# Patient Record
Sex: Female | Born: 1955 | Race: Black or African American | Hispanic: No | Smoking: Never smoker
Health system: Southern US, Community
[De-identification: ages and names within clinical notes are randomized; demographics above are authoritative.]

## PROBLEM LIST (undated history)

## (undated) DIAGNOSIS — F32A Depression, unspecified: Secondary | ICD-10-CM

## (undated) DIAGNOSIS — S0990XA Unspecified injury of head, initial encounter: Secondary | ICD-10-CM

## (undated) DIAGNOSIS — M329 Systemic lupus erythematosus, unspecified: Secondary | ICD-10-CM

## (undated) DIAGNOSIS — E669 Obesity, unspecified: Secondary | ICD-10-CM

## (undated) DIAGNOSIS — I5020 Unspecified systolic (congestive) heart failure: Secondary | ICD-10-CM

## (undated) DIAGNOSIS — Z940 Kidney transplant status: Secondary | ICD-10-CM

## (undated) DIAGNOSIS — F419 Anxiety disorder, unspecified: Secondary | ICD-10-CM

## (undated) DIAGNOSIS — I6389 Other cerebral infarction: Secondary | ICD-10-CM

## (undated) DIAGNOSIS — N2581 Secondary hyperparathyroidism of renal origin: Secondary | ICD-10-CM

## (undated) DIAGNOSIS — F329 Major depressive disorder, single episode, unspecified: Secondary | ICD-10-CM

## (undated) DIAGNOSIS — I480 Paroxysmal atrial fibrillation: Secondary | ICD-10-CM

## (undated) DIAGNOSIS — I2510 Atherosclerotic heart disease of native coronary artery without angina pectoris: Secondary | ICD-10-CM

## (undated) DIAGNOSIS — Z87448 Personal history of other diseases of urinary system: Secondary | ICD-10-CM

## (undated) DIAGNOSIS — N186 End stage renal disease: Secondary | ICD-10-CM

## (undated) DIAGNOSIS — R0600 Dyspnea, unspecified: Secondary | ICD-10-CM

## (undated) DIAGNOSIS — Z87442 Personal history of urinary calculi: Secondary | ICD-10-CM

## (undated) DIAGNOSIS — N200 Calculus of kidney: Secondary | ICD-10-CM

## (undated) DIAGNOSIS — G459 Transient cerebral ischemic attack, unspecified: Secondary | ICD-10-CM

## (undated) DIAGNOSIS — N051 Unspecified nephritic syndrome with focal and segmental glomerular lesions: Secondary | ICD-10-CM

## (undated) DIAGNOSIS — I951 Orthostatic hypotension: Secondary | ICD-10-CM

## (undated) DIAGNOSIS — D649 Anemia, unspecified: Secondary | ICD-10-CM

## (undated) DIAGNOSIS — I319 Disease of pericardium, unspecified: Secondary | ICD-10-CM

## (undated) DIAGNOSIS — I10 Essential (primary) hypertension: Secondary | ICD-10-CM

## (undated) DIAGNOSIS — I255 Ischemic cardiomyopathy: Secondary | ICD-10-CM

## (undated) HISTORY — PX: KIDNEY SURGERY: SHX687

## (undated) HISTORY — PX: COLONOSCOPY W/ POLYPECTOMY: SHX1380

## (undated) HISTORY — DX: Obesity, unspecified: E66.9

## (undated) HISTORY — DX: Calculus of kidney: N20.0

## (undated) HISTORY — DX: Systemic lupus erythematosus, unspecified: M32.9

---

## 1997-08-15 ENCOUNTER — Other Ambulatory Visit: Admission: RE | Admit: 1997-08-15 | Discharge: 1997-08-15 | Payer: Self-pay | Admitting: Obstetrics and Gynecology

## 1998-08-22 ENCOUNTER — Emergency Department (HOSPITAL_COMMUNITY): Admission: EM | Admit: 1998-08-22 | Discharge: 1998-08-22 | Payer: Self-pay

## 1999-01-20 ENCOUNTER — Emergency Department (HOSPITAL_COMMUNITY): Admission: EM | Admit: 1999-01-20 | Discharge: 1999-01-20 | Payer: Self-pay | Admitting: Emergency Medicine

## 1999-05-08 ENCOUNTER — Other Ambulatory Visit: Admission: RE | Admit: 1999-05-08 | Discharge: 1999-05-08 | Payer: Self-pay | Admitting: Obstetrics and Gynecology

## 1999-05-08 ENCOUNTER — Encounter (INDEPENDENT_AMBULATORY_CARE_PROVIDER_SITE_OTHER): Payer: Self-pay

## 1999-05-11 ENCOUNTER — Encounter: Payer: Self-pay | Admitting: Obstetrics and Gynecology

## 1999-05-11 ENCOUNTER — Ambulatory Visit (HOSPITAL_COMMUNITY): Admission: RE | Admit: 1999-05-11 | Discharge: 1999-05-11 | Payer: Self-pay | Admitting: Obstetrics and Gynecology

## 1999-10-04 ENCOUNTER — Emergency Department (HOSPITAL_COMMUNITY): Admission: EM | Admit: 1999-10-04 | Discharge: 1999-10-04 | Payer: Self-pay | Admitting: Emergency Medicine

## 2000-03-11 ENCOUNTER — Emergency Department (HOSPITAL_COMMUNITY): Admission: EM | Admit: 2000-03-11 | Discharge: 2000-03-11 | Payer: Self-pay | Admitting: Emergency Medicine

## 2000-03-11 ENCOUNTER — Encounter: Payer: Self-pay | Admitting: Emergency Medicine

## 2000-08-18 ENCOUNTER — Emergency Department (HOSPITAL_COMMUNITY): Admission: EM | Admit: 2000-08-18 | Discharge: 2000-08-18 | Payer: Self-pay | Admitting: Emergency Medicine

## 2000-08-29 ENCOUNTER — Encounter: Admission: RE | Admit: 2000-08-29 | Discharge: 2000-09-23 | Payer: Self-pay | Admitting: Rheumatology

## 2000-10-21 ENCOUNTER — Encounter: Payer: Self-pay | Admitting: Rheumatology

## 2000-10-21 ENCOUNTER — Encounter: Admission: RE | Admit: 2000-10-21 | Discharge: 2000-10-21 | Payer: Self-pay | Admitting: Rheumatology

## 2000-11-04 ENCOUNTER — Encounter: Payer: Self-pay | Admitting: Geriatric Medicine

## 2000-11-04 ENCOUNTER — Inpatient Hospital Stay (HOSPITAL_COMMUNITY): Admission: EM | Admit: 2000-11-04 | Discharge: 2000-11-05 | Payer: Self-pay

## 2000-11-05 ENCOUNTER — Encounter: Payer: Self-pay | Admitting: Geriatric Medicine

## 2000-12-06 ENCOUNTER — Other Ambulatory Visit: Admission: RE | Admit: 2000-12-06 | Discharge: 2000-12-06 | Payer: Self-pay | Admitting: Family Medicine

## 2001-04-24 ENCOUNTER — Ambulatory Visit (HOSPITAL_COMMUNITY): Admission: RE | Admit: 2001-04-24 | Discharge: 2001-04-24 | Payer: Self-pay | Admitting: Family Medicine

## 2001-04-24 ENCOUNTER — Encounter: Payer: Self-pay | Admitting: Family Medicine

## 2002-11-16 ENCOUNTER — Other Ambulatory Visit: Admission: RE | Admit: 2002-11-16 | Discharge: 2002-11-16 | Payer: Self-pay | Admitting: Family Medicine

## 2004-05-05 ENCOUNTER — Emergency Department (HOSPITAL_COMMUNITY): Admission: EM | Admit: 2004-05-05 | Discharge: 2004-05-05 | Payer: Self-pay | Admitting: Emergency Medicine

## 2005-05-10 ENCOUNTER — Emergency Department (HOSPITAL_COMMUNITY): Admission: EM | Admit: 2005-05-10 | Discharge: 2005-05-10 | Payer: Self-pay | Admitting: Emergency Medicine

## 2005-11-10 ENCOUNTER — Ambulatory Visit (HOSPITAL_COMMUNITY): Admission: RE | Admit: 2005-11-10 | Discharge: 2005-11-10 | Payer: Self-pay | Admitting: Family Medicine

## 2008-04-02 ENCOUNTER — Other Ambulatory Visit: Admission: RE | Admit: 2008-04-02 | Discharge: 2008-04-02 | Payer: Self-pay | Admitting: Family Medicine

## 2009-02-15 DIAGNOSIS — Z87448 Personal history of other diseases of urinary system: Secondary | ICD-10-CM | POA: Diagnosis present

## 2009-02-15 DIAGNOSIS — N051 Unspecified nephritic syndrome with focal and segmental glomerular lesions: Secondary | ICD-10-CM | POA: Insufficient documentation

## 2009-02-15 DIAGNOSIS — Z8739 Personal history of other diseases of the musculoskeletal system and connective tissue: Secondary | ICD-10-CM

## 2009-02-15 HISTORY — DX: Personal history of other diseases of the musculoskeletal system and connective tissue: Z87.39

## 2009-02-15 HISTORY — DX: Unspecified nephritic syndrome with focal and segmental glomerular lesions: N05.1

## 2009-02-15 HISTORY — DX: Personal history of other diseases of urinary system: Z87.448

## 2009-07-27 ENCOUNTER — Emergency Department (HOSPITAL_COMMUNITY): Admission: EM | Admit: 2009-07-27 | Discharge: 2009-07-27 | Payer: Self-pay | Admitting: Emergency Medicine

## 2010-01-20 ENCOUNTER — Inpatient Hospital Stay (HOSPITAL_COMMUNITY)
Admission: EM | Admit: 2010-01-20 | Discharge: 2010-01-24 | Payer: Self-pay | Source: Home / Self Care | Attending: Family Medicine | Admitting: Family Medicine

## 2010-01-23 ENCOUNTER — Encounter: Payer: Self-pay | Admitting: Family Medicine

## 2010-04-27 LAB — URINE MICROSCOPIC-ADD ON

## 2010-04-27 LAB — CBC
HCT: 23 % — ABNORMAL LOW (ref 36.0–46.0)
HCT: 25.1 % — ABNORMAL LOW (ref 36.0–46.0)
Hemoglobin: 7.3 g/dL — ABNORMAL LOW (ref 12.0–15.0)
Hemoglobin: 7.4 g/dL — ABNORMAL LOW (ref 12.0–15.0)
Hemoglobin: 7.7 g/dL — ABNORMAL LOW (ref 12.0–15.0)
Hemoglobin: 8.3 g/dL — ABNORMAL LOW (ref 12.0–15.0)
Hemoglobin: 8.4 g/dL — ABNORMAL LOW (ref 12.0–15.0)
MCH: 27.1 pg (ref 26.0–34.0)
MCH: 27.4 pg (ref 26.0–34.0)
MCH: 27.5 pg (ref 26.0–34.0)
MCH: 27.5 pg (ref 26.0–34.0)
MCHC: 32.6 g/dL (ref 30.0–36.0)
MCHC: 33.1 g/dL (ref 30.0–36.0)
MCHC: 33.5 g/dL (ref 30.0–36.0)
MCV: 81.7 fL (ref 78.0–100.0)
Platelets: 312 10*3/uL (ref 150–400)
RBC: 2.61 MIL/uL — ABNORMAL LOW (ref 3.87–5.11)
RBC: 2.73 MIL/uL — ABNORMAL LOW (ref 3.87–5.11)
RBC: 2.81 MIL/uL — ABNORMAL LOW (ref 3.87–5.11)
RBC: 3.02 MIL/uL — ABNORMAL LOW (ref 3.87–5.11)
RBC: 3.06 MIL/uL — ABNORMAL LOW (ref 3.87–5.11)
RDW: 17 % — ABNORMAL HIGH (ref 11.5–15.5)
WBC: 4.2 10*3/uL (ref 4.0–10.5)
WBC: 5.3 10*3/uL (ref 4.0–10.5)
WBC: 7.1 10*3/uL (ref 4.0–10.5)

## 2010-04-27 LAB — URINE CULTURE
Culture  Setup Time: 201112071350
Culture: NO GROWTH

## 2010-04-27 LAB — URINALYSIS, MICROSCOPIC ONLY
Glucose, UA: NEGATIVE mg/dL
Protein, ur: >300 mg/dL — AB
pH: 6 (ref 5.0–8.0)

## 2010-04-27 LAB — CULTURE, BLOOD (ROUTINE X 2): Culture: NO GROWTH

## 2010-04-27 LAB — PTH, INTACT AND CALCIUM
Calcium, Total (PTH): 6.9 mg/dL — ABNORMAL LOW (ref 8.4–10.5)
PTH: 261.6 pg/mL — ABNORMAL HIGH (ref 14.0–72.0)

## 2010-04-27 LAB — PROTIME-INR: Prothrombin Time: 13.2 seconds (ref 11.6–15.2)

## 2010-04-27 LAB — STOOL CULTURE

## 2010-04-27 LAB — FECAL LACTOFERRIN, QUANT: Fecal Lactoferrin: NEGATIVE

## 2010-04-27 LAB — BASIC METABOLIC PANEL
BUN: 23 mg/dL (ref 6–23)
CO2: 17 mEq/L — ABNORMAL LOW (ref 19–32)
CO2: 19 mEq/L (ref 19–32)
Calcium: 7 mg/dL — ABNORMAL LOW (ref 8.4–10.5)
Calcium: 7.1 mg/dL — ABNORMAL LOW (ref 8.4–10.5)
Calcium: 7.1 mg/dL — ABNORMAL LOW (ref 8.4–10.5)
Chloride: 110 mEq/L (ref 96–112)
GFR calc Af Amer: 19 mL/min — ABNORMAL LOW (ref >60)
GFR calc Af Amer: 23 mL/min — ABNORMAL LOW (ref >60)
GFR calc Af Amer: 26 mL/min — ABNORMAL LOW (ref >60)
GFR calc non Af Amer: 15 mL/min — ABNORMAL LOW (ref >60)
GFR calc non Af Amer: 21 mL/min — ABNORMAL LOW (ref >60)
GFR calc non Af Amer: 23 mL/min — ABNORMAL LOW (ref >60)
Glucose, Bld: 94 mg/dL (ref 70–99)
Glucose, Bld: 97 mg/dL (ref 70–99)
Potassium: 3.9 mEq/L (ref 3.5–5.1)
Potassium: 4.1 mEq/L (ref 3.5–5.1)
Potassium: 4.6 mEq/L (ref 3.5–5.1)
Sodium: 132 mEq/L — ABNORMAL LOW (ref 135–145)
Sodium: 133 mEq/L — ABNORMAL LOW (ref 135–145)
Sodium: 137 mEq/L (ref 135–145)

## 2010-04-27 LAB — URINALYSIS, ROUTINE W REFLEX MICROSCOPIC
Bilirubin Urine: NEGATIVE
Nitrite: NEGATIVE
Specific Gravity, Urine: 1.026 (ref 1.005–1.030)
Urobilinogen, UA: 0.2 mg/dL (ref 0.0–1.0)
pH: 5.5 (ref 5.0–8.0)

## 2010-04-27 LAB — COMPREHENSIVE METABOLIC PANEL
ALT: 21 U/L (ref 0–35)
AST: 37 U/L (ref 0–37)
Albumin: 1.6 g/dL — ABNORMAL LOW (ref 3.5–5.2)
Alkaline Phosphatase: 31 U/L — ABNORMAL LOW (ref 39–117)
CO2: 18 mEq/L — ABNORMAL LOW (ref 19–32)
Chloride: 106 mEq/L (ref 96–112)
Creatinine, Ser: 3.12 mg/dL — ABNORMAL HIGH (ref 0.4–1.2)
GFR calc Af Amer: 19 mL/min — ABNORMAL LOW (ref >60)
GFR calc non Af Amer: 16 mL/min — ABNORMAL LOW (ref >60)
Potassium: 4.3 mEq/L (ref 3.5–5.1)
Sodium: 131 mEq/L — ABNORMAL LOW (ref 135–145)
Total Bilirubin: 0.3 mg/dL (ref 0.3–1.2)

## 2010-04-27 LAB — DIFFERENTIAL
Basophils Relative: 0 % (ref 0–1)
Eosinophils Absolute: 0 10*3/uL (ref 0.0–0.7)
Eosinophils Relative: 0 % (ref 0–5)
Monocytes Absolute: 0.2 10*3/uL (ref 0.1–1.0)
Neutro Abs: 2.8 10*3/uL (ref 1.7–7.7)

## 2010-04-27 LAB — PHOSPHORUS: Phosphorus: 3.3 mg/dL (ref 2.3–4.6)

## 2010-04-27 LAB — PROTEIN, URINE, 24 HOUR
Collection Interval-UPROT: 24 hours
Protein, 24H Urine: 13386 mg/d — ABNORMAL HIGH (ref 50–100)
Protein, Urine: 582 mg/dL
Urine Total Volume-UPROT: 2300 mL

## 2010-04-27 LAB — HEMOCCULT GUIAC POC 1CARD (OFFICE): Fecal Occult Bld: NEGATIVE

## 2010-04-27 LAB — ANTI-NUCLEAR AB-TITER (ANA TITER): ANA Titer 1: 1:2560 {titer}

## 2010-04-27 LAB — GLUCOSE, CAPILLARY

## 2010-04-27 LAB — CLOSTRIDIUM DIFFICILE BY PCR: Toxigenic C. Difficile by PCR: NEGATIVE

## 2010-04-27 LAB — APTT: aPTT: 34 seconds (ref 24–37)

## 2010-05-04 LAB — CBC
HCT: 29.7 % — ABNORMAL LOW (ref 36.0–46.0)
Hemoglobin: 10.3 g/dL — ABNORMAL LOW (ref 12.0–15.0)
Platelets: 221 10*3/uL (ref 150–400)
RBC: 3.51 MIL/uL — ABNORMAL LOW (ref 3.87–5.11)
RDW: 16.8 % — ABNORMAL HIGH (ref 11.5–15.5)
WBC: 4.8 10*3/uL (ref 4.0–10.5)

## 2010-05-04 LAB — URINE MICROSCOPIC-ADD ON

## 2010-05-04 LAB — DIFFERENTIAL
Basophils Relative: 0 % (ref 0–1)
Eosinophils Absolute: 0 10*3/uL (ref 0.0–0.7)
Eosinophils Relative: 0 % (ref 0–5)
Monocytes Relative: 6 % (ref 3–12)
Neutrophils Relative %: 63 % (ref 43–77)

## 2010-05-04 LAB — URINE CULTURE

## 2010-05-04 LAB — URINALYSIS, ROUTINE W REFLEX MICROSCOPIC
Glucose, UA: NEGATIVE mg/dL
Ketones, ur: NEGATIVE mg/dL
Specific Gravity, Urine: 1.018 (ref 1.005–1.030)
pH: 7 (ref 5.0–8.0)

## 2010-05-04 LAB — BASIC METABOLIC PANEL
Calcium: 8.1 mg/dL — ABNORMAL LOW (ref 8.4–10.5)
GFR calc Af Amer: >60 mL/min (ref >60)
GFR calc non Af Amer: >60 mL/min (ref >60)
Potassium: 5.1 mEq/L (ref 3.5–5.1)
Sodium: 129 mEq/L — ABNORMAL LOW (ref 135–145)

## 2010-07-03 NOTE — Discharge Summary (Signed)
[place]. [facility]  Patient:    Jenna [name], Jenna [name] Visit Number: [id number] MRN: [id number]          Service Type: MED Location: 2000 2036 01 Attending Physician:  Mathews Argyle Dictated by:   Hal T. Stoneking, M.D. Admit Date:  11/04/2000 Discharge Date: 11/05/2000   CC:         Ander Slade. Rockwell Alexandria, M.D.   Discharge Summary  ADMISSION DIAGNOSIS:  Chest pain.  DISCHARGE DIAGNOSES: 1. Chest pain, noncardiac. 2. Systemic lupus erythematosus presenting with leukopenia, elevated    sedimentation rate and history of pericarditis in 1990. 3. History of anemia, question iron deficiency. 4. History of nephrolithiasis. 5. History of panic attack and anxiety.  BRIEF HISTORY:  Mrs. Sedam is a very nice 55 year old black female.  On November 02, 2000 she developed discomfort in the upper center of her chest without radiation.  She denied any shortness of breath or nausea or vomiting. On November 05, 2000 she had one or more episodes with discomfort primarily in the middle of her chest.  No pleuritic signs.  She has had some symptoms of a chill but no sweating.  She was brought into the office for evaluation.  She has had a prior history of pericarditis in 1990.  She carries a diagnosis of lupus made in 2002.  PHYSICAL EXAMINATION:  Blood pressure 110/70, O2 saturation 98% on room air. Weight 164.  HEENT: Unremarkable.  Lungs:  Clear.  Heart:  Regular rate and rhythm without murmur. Abdomen: Soft. No hepatosplenomegaly or masses palpated.  LABORATORY DATA ON ADMISSION:  White count 5000, hemoglobin 9.6, platelet count 297,000; 88% neutrophils, 11% lymphs. Sodium 135, potassium 4.0, chloride 103, bicarbonate 27, glucose 105, BUN 4, creatinine 0.6, calcium 8.6, total protein 8.0, albumin 2.8.  CK 59 with an MB of 1.5, troponin 0.01.  AST 25, ALT 16, ALP 31, total bilirubin 0.5.  Compliment C3 is low at 74.  C4 compliment is 26 which is normal.   Urinalysis normal.  Blood cultures normal. Urine culture revealed multiple species.  HOSPITAL COURSE:  The patient was admitted to telemetry bed.  Serial CK MBs revealed no evidence of cardiac injury. EKG:  revealed normal sinus rhythm with T wave flattening in the inferior leads.  T waves could have reflected pseudonormalization.  Her pain resolved spontaneously.  She underwent Cardiolite study which revealed no evidence of ischemia.  Ejection fraction 66%.  She was discharged home with the diagnosis of noncardiac chest pain.  DISCHARGE MEDICATIONS: 1. Iron sulfate 325 mg q.d. 2. Plaquenil 200 mg p.o. b.i.d. 3. Prednisone 10/10/7.5 mg alternating every three days. 4. Alprazolam 0.5 mg q.h.s. 5. Multiple vitamin q.d. Dictated by:   Hal T. Stoneking, M.D. Attending Physician:  Mathews Argyle DD:  11/18/00 TD:  11/20/00 Job: [id number]

## 2010-07-03 NOTE — H&P (Signed)
[place]. [facility]  Patient:    Jenna [name], Jenna [name] Visit Number: [id number] MRN: [id number]          Service Type: Attending:  Ander Slade. Rockwell Alexandria, M.D. Dictated by:   Ander Slade. Rockwell Alexandria, M.D. Adm. Date:  11/04/00   CC:         Hal T. Stoneking, M.D.  Fabio Asa, M.D.  Emeline General Dema Severin, M.D.   History and Physical  DATE OF BIRTH:  1955-07-11  REASON FOR ADMISSION:  A 55 year old right-handed African-American female, chief complaint "my chest hurts," admitted with chest pain (upper sternum), EKG (? ST elevation V2, nonspecified change lead III, lead F, and V3-V5) ? etiology, initially to rule out ischemic heart disease, ? other.  PROBLEM #1:  Chest hurting, ? etiology.  Onset November 02, 2000 in the evening without precipitating event of a hurting in the upper center of the chest without radiation to the neck or arms, shortness of breath, nausea, vomiting, or dizziness.  It occurred primarily with a deep breath.  On November 05, 2000 she also had ? one or more episodes where she also had the discomfort without a deep breath but cannot tell me how long.  At this time her nose also became congested and she started taking a decongestant (from Dr. Dema Severin) and today has had clear material blowing out of her nose with some yellow specks.  She has had no fever but had a chill yesterday but no sweat.  She had no abdominal pain, nausea, vomiting, diarrhea, cough, wheezing.  Her shoulders were aching.  The patient walked in the office this morning and was seen.  In the office 110/70, weight 164, temperature 99.7, pulse 72, O2 saturation 98 on room air.  Some tenderness to palpation upper sternum on the left side but the pain, she said, was different from her pain that brought her in.  Lungs clear to auscultation. Heart regular rhythm, no murmur, gallop, or rub.  No peripheral edema. Abdomen benign.  Stool negative for blood.  EKG normal sinus rhythm with  T wave flat in III and F and V3-V5 and ? ST elevation in V2 in sinus rhythm. This was compared to August 17, 2000 EKG where she had some T inversion in III and F and V3.  I reviewed with Dr. Jaci Standard and felt patient with EKG changes needed to be admitted to rule out ischemic heart disease.  Discussed with patient and agrees to admission.  I called Harlan Stains, primary care family physician, and discussed admission.  Patient has a history of pericarditis in 36 to 90.  The patient has a diagnosis of lupus in 2002.  In August 2002 had right chest pain thought to be muscular.  The patient had a 2-D echocardiogram in evaluating her lupus that was normal on August 17, 2000.  The patient had episode of fever July 2002 following a tooth extraction with blood cultures no growth and fever resolved. (Patient did not remember her chest discomfort described above.)  Usually does not get chest pain.  Patient has had nasal congestion the last two days and since taking decongestants she has had some clear material with ? yellow material coming out of nose today.  Does not have any teeth pain.  Patient has had weakly positive anticardiolipin antibody tests in the past.   Chest x-ray June 2002 no active disease.  No history of phlebitis.  PAST MEDICAL HISTORY:  ALLERGIES:  ? PENICILLIN.  MEDICATIONS: 1. Iron sulfate  once a day (dark stool). 2. Plaquenil 200 mg b.i.d. - recently reduced to one a day when she had some    headache but still gets some headache. 3. Prednisone 10-10-7.5 mg every three days. 4. Pseudoephedrine/guaifenesin ER is the decongestant she took. 5. Alprazolam 0.5 mg h.s. p.r.n. 6. Multivitamin once a day.  SURGERY: 1. Status post tooth extraction for decay left lower jaw July 2002.  Episode    of fever several days later; blood culture no growth.  Fever resolved. 2. Gravida 2 para 1 AB 1.  Normal delivery.  INJURY:  None.  MEDICAL:  1. Systemic lupus erythematosus.  I  evaluated the patient August 08, 2000 for a     syndrome of polyarthralgia ? arthritis, fatigue, left axillary lymph     node, leukopenia, anemia, elevated sedimentation rate, and history of     pericarditis in 1991 or 1992.  The patient had anti-DNA positive,     anti-SSA antibody positive, anti-SSB antibody positive.  Diagnosis of     SLE made.      For joints, patient tried nonsteroidal drug without help.  Subsequently     put on prednisone with improvement and Plaquenil with improvement.     Still, however, gets intermittent arthralgia and had shoulder pain today.      Patient has continued with fatigue.  That episode of fever outlined     above post tooth extraction that resolved.      On exam, patient has had left axillary lymph node that is unchanged.      Patient has had leukopenia.  She has also had anemia with low percent     saturation of iron.  Patient had some bright red blood per rectum when     in the office August 17, 2000.  Flexible sigmoidoscopy by Dr. Sammuel Cooper     showed internal hemorrhoid with clot although not actively bleeding.     Patient subsequently on iron.  Hemoglobin has been 10.1 on July 20, 2000     and 10.2 on September 01, 2000.      The patient has had an elevated sedimentation rate.  She has a history     of pericarditis with no recurrence of her pericarditis chest pain.  She     had an elevated SGOT on initial labs, ? significance, with a CPK that     was normal.  She had some frontal alopecia on exam but no mouth ulcer or     rash.  She has not been able to work because of her arthralgia and has     applied for disability.  Currently has no mouth ulcer or rash.  She has     not had a seizure disorder.  There is no Raynauds phenomenon.  Her     urinalysis has been unremarkable.  Her C3 was low and C4 normal on July     2002.  Her albumin was low at 3.1 in July 2002.  Patient also had low      positive titer anticardiolipin antibody.  Has had one spontaneous      abortion, no history of phlebitis.   2. Anemia, ? iron deficiency in her chronic disease on iron sulfate with dark     stool.  Stool is negative for blood on exam.   3. Status post diarrhea, initially seen with diarrhea which resolved.  Has     some semisolid stool now.  No abdominal pain.  ? etiology.  4. Status post bright red blood stool. Flexible sigmoidoscopy July 2002,     small internal hemorrhoid with clot not bleeding.   5. Status post diuretic for fluids.   6. History of eczema.   7. History of passing kidney stone - one spontaneously.   8. GYN - not sexually active, last menses just had some minimal spotting.   9. History of pericarditis 1991 - 1992.  10. History of panic attack/anxiety.  11. History of anxiety as child.  12. Osteoporosis prevention.  Patient on Premarin.  She had bone density      September 2002, T score of lumbar spine +0.2 and of hip -0.3.  Patient      does not take calcium regularly.  13. Initially I thought thyroid might be palpable on exam but I have not been     able to palpate it subsequently.  14. History of indigestion.  Has taken H2 blockers in the past but not taking     any now.  Status post proton pump inhibitor.  FAMILY AND SOCIAL HISTORY:  Separated, gravida 2 para 1, one spontaneous abortion, one child age 86 she has been able to get child care for as she is going in the hospital.  Does not smoke or drink alcohol.  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  However, tried to work half a day the other day and was with a woman who had an upper respiratory illness who drove her to work.  Family history of stroke, hypertension and diabetes.  REVIEW OF SYSTEMS:  All other systems negative.  Also has had a history of rhinitis in the past.  PHYSICAL EXAMINATION:  GENERAL:  Alert, no acute distress.  Drove over to the office.  VITAL SIGNS:  Blood pressure 110/70 right arm sitting, O2  saturation 98% on room air, weight 164, temperature 99.7, pulse 72.  SKIN:  Frontal alopecia.  No rash.  Skin was not sweaty.  HEENT:  Head:  Temporal arteries normal.  Eyes:  Conjunctivae pink, sclerae white.  Pupils are equal, round and reactive to light.  Fundi normal.  Ears: Otoscopic normal.  No tenderness over sinuses.  Nose:  Boggy nasal mucosa. Mouth and throat:  Upper denture and no lesions otherwise in mouth.  NECK:  Supple.  Thyroid was not palpable.  No bruit.  No cervical adenopathy. There was a left axillary lymph node movable, unchanged in size to me.  BREASTS:  Symmetrical without mass, tenderness, or nipple discharge. Chaperoned by nurse.  LUNGS:  Clear to auscultation and percussion.  HEART:  Regular rhythm without murmur, gallop, or rub.  Some chest wall pain on palpation, upper left parasternal area near manubrium.  ABDOMEN:  Benign without organomegaly or bruit.  GYNECOLOGIC:  Not done.  RECTAL:  Chaperoned by nurse.  Stool dark grayish, negative for blood.  EXTREMITIES:  No edema.  Pulses intact.  NEUROLOGIC:  Oriented x 3.  No gross focal neurologic findings.  Muscle strength normal.  BACK:  No pain on palpation of spine.  PERIPHERAL JOINT:  No synovitis in joints.  Could abduct right and left shoulder full, greater than 150 degrees, with some nonspecific discomfort in shoulders.  LABORATORY DATA:  O2 saturation 98% on room air.  EKG as outlined above.  IMPRESSION:  Chest pain/EKG change, ? etiology.  Rule out ischemic coronary artery disease, rule out systemic lupus erythematosus (pericarditis, pleuritis, ? other).  See ? URI.  ? other.  PLAN:  1. Discussed admission with patient, Dr. Harlan Stains, Dr. Fabio Asa.    Patient has made arrangements for care of her daughter and had a friend    come over to take her car. 2. Rule out coronary artery disease with serial enzyme and EKG.  Spoke with    Violeta Gelinas.  Adenosine Cardiolite cannot be  obtained until tomorrow with    respect to scheduling.  This is going to be done if her first enzymes    are negative.  Will initially give aspirin.  Will not use any IV    nitroglycerin or heparin unless patient has recurrent chest pain.  She    did not have chest pain following initial coming to office. 3. Rule out lupus.  Possibility of serositis with lupus causing chest pain.  I    am going to get C3, C4, anti-DNA, anticardiolipin antibody, CBC, renal    function, urinalysis.  Currently will increase prednisone to 10 mg a day    and go back to Plaquenil 200 mg b.i.d. pending evaluation. 4. Temperature 99.7, ? clinical significance.  I want to get a blood culture,    urinalysis and culture.  ? related to rhinorrhea which may be causing    her nasal congestion and ? temperature 99.7. 5. Initially I have not ordered 2-D echocardiogram (pending above evaluation).    Patient will be put on low cholesterol diet.  See order sheet for    details. Dictated by:   Ander Slade. Rockwell Alexandria, M.D. Attending:  Ander Slade. Rockwell Alexandria, M.D. DD:  11/04/00 TD:  11/04/00 Job: [id number]

## 2010-07-03 NOTE — Consult Note (Signed)
[place]. [facility]  Patient:    Jenna [name], Jenna [name] Visit Number: [id number] MRN: [id number]          Service Type: MED Location: 2000 2036 01 Attending Physician:  Mathews Argyle Dictated by:   Fabio Asa, M.D. Admit Date:  11/04/2000   CC:         Ander Slade. Rockwell Alexandria, M.D.  Rob Hickman, M.D.  Emeline General Dema Severin, M.D.   Consultation Report  REFERRING PHYSICIAN:  Ander Slade. Rockwell Alexandria, M.D.  REASON FOR EVALUATION:  Chest pain.  HISTORY OF PRESENT ILLNESS:  Jenna [name] is a pleasant 55 year old female who has had atypical chest pain.  On review of this chart, she has actually had atypical chest pain since September 3.  She was diagnosed with right posterior chest wall numbness and pain, questionable etiology, at this time. She presented to Dr. Sharyn Dross office today with complaints of her chest hurting with the onset of this occurring on November 02, 2000.  She had not noted any aggravating factors.  The pain occurred midsternum and was nonradiating.  There was no shortness of breath, nausea, or vomiting or dizziness.  Chest pain appeared to be aggravated by deep inspiration.  She has attributed the pain to bronchitis.  She notes that she has had nasal congestion for two days and has been on decongestants.  She has had a yellow productive cough but no fevers, no chills, and no sweats.  Coronary risk factors include diet-controlled dyslipidemia.  There is no significant family history, no history of tobacco abuse.  ECG performed in the office revealed normal sinus rhythm with T-wave flattening in the inferior leads, and she was noted previously to have T-wave inversion and questionable ST elevation in V2. It was felt that the T-wave changes represent pseudonormalization.  She has had no further chest pain since approximately 11 a.m. following an episode of productive cough.  She does note that she has had some left shoulder soreness and  left knee pain, which she attributes to her lupus.  Her initial cardiac enzymes are negative.  Her ECG now reveals sinus rhythm with the T-wave abnormalities resolved.  The chart was reviewed.  She had a normal echo from July 2002.  PAST MEDICAL HISTORY:  1. Lupus with a history of pericarditis diagnosed in 22 and 1991.  She has     had right chest wall pain which was felt to be secondary to muscular pain.  2. Past medical history is otherwise significant for polyarthritis/arthritis,     and she has had swelling in her PIP joints.  3. Fatigue.  4. Lymphadenopathy with a persistent left axillary node.  4. Leukopenia.  5. Anemia.  6. Elevated sedimentation rate.  7. Elevated SGOT in the past.  8. Frontal alopecia.  9. Low titer-positive anticardiolipin antibody. 10. Diarrhea.  PAST SURGICAL HISTORY:  1. Dental extraction.  2. Vaginal delivery.  INJURIES:  None.  MEDICATIONS:  1. Aspirin 81 mg daily.  2. Plaquenil 200 mg b.i.d.  3. Prednisone 10 mg p.o. q.d.  4. Xanax 0.5 mg p.o. q.h.s. p.r.n.  5. Centrum q.d.  6. Iron sulfate 325 mg p.o. q.d.  7. Extra Strength Tylenol.  8. Sublingual nitroglycerin.  9. Demerol p.r.n. 10. Phenergan p.r.n.  ALLERGIES:  PENICILLIN, which results in dissociation.  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  FAMILY HISTORY:  Her father passed at age 58 with a stroke, no history of coronary artery  disease.  Mother is 3 and suffers from mild dementia.  She has eight siblings.  One brother passed with liver cirrhosis.  REVIEW OF SYSTEMS:  Negative for headaches, negative GERD, negative dyspnea with exertion, no orthopnea, no PND, no tachyarrhythmias.  She has had ongoing pain from her lupus.  PHYSICAL EXAMINATION:  VITAL SIGNS:  Her initial temperature is 99.7 in the office.  It is currently 98.2.  Blood pressure is 107/62, heart rate is 92, respiratory rate is 12.  GENERAL:  A middle-aged female in  no acute distress.  She is alert. Forde Dandy is appropriate.  She is well-nourished.  HEENT:  She does have evidence of frontal alopecia.  HEENT is unremarkable.  NECK:  Good carotid upstroke, no carotid bruits, and no neck vein distention is noted.  CHEST:  Pulmonary exam reveals breath sounds which are equal and clear to auscultation.  No use of accessory muscles.  CARDIAC:  A regular rate and rhythm.  There are no appreciable rubs, murmurs, or gallops noted.  Her PMI appears to be within normal limits.  ABDOMEN:  Soft and benign, nontender.  No unusual bruits or pulsations are noted.  EXTREMITIES:  Extremities do not reveal any peripheral edema.  SKIN:  Warm and dry.  NEUROLOGIC:  Nonfocal.  LABORATORY DATA:  White count is 5.0, hematocrit 29.1, platelet count is 297. Sodium is 135, potassium is 4.0, creatinine is 0.6.  CK-MB is negative. Initial CK was 59, troponin I is 0.01.  Chest x-ray reveals no acute disease. ECG is as noted above.  IMPRESSION:  Chest pain which is somewhat atypical for cardiac.  She has had no recurrent pain since her hospital admission.  She does now complain of vague left shoulder pain, which is new.  Differential diagnosis would include coronary artery disease, low suspicion, but concern due to her ECG changes. Other etiologies include bronchitis and possible pericarditis.  We will perform a stress Cardiolite in the a.m. if her serial cardiac enzymes remain negative and no further ECG changes.  I agree with the current treatment as you have outlined, including ECG and CPKs.  Do not feel that anticoagulation is indicated at this time in that the patient has had no further chest pain. Will order sublingual nitroglycerin p.r.n. for chest pain. Dictated by:   Fabio Asa, M.D.  Attending Physician:  Mathews Argyle DD:  11/04/00 TD:  11/05/00 Job: [id number]

## 2010-09-02 ENCOUNTER — Other Ambulatory Visit: Payer: Self-pay | Admitting: Family Medicine

## 2010-09-02 DIAGNOSIS — IMO0002 Reserved for concepts with insufficient information to code with codable children: Secondary | ICD-10-CM

## 2010-09-02 DIAGNOSIS — M329 Systemic lupus erythematosus, unspecified: Secondary | ICD-10-CM

## 2011-03-18 ENCOUNTER — Ambulatory Visit (INDEPENDENT_AMBULATORY_CARE_PROVIDER_SITE_OTHER): Payer: BC Managed Care – PPO | Admitting: Family Medicine

## 2011-03-18 VITALS — BP 124/73 | HR 91 | Temp 98.6°F | Resp 16 | Ht 65.5 in | Wt 200.0 lb

## 2011-03-18 DIAGNOSIS — L0100 Impetigo, unspecified: Secondary | ICD-10-CM

## 2011-03-18 MED ORDER — MUPIROCIN 2 % EX OINT: TOPICAL_OINTMENT | Freq: Three times a day (TID) | CUTANEOUS | Status: AC

## 2011-03-18 NOTE — Progress Notes (Signed)
  Patient Name: Jenna [name] Date of Birth: Apr 13, 1955 Medical Record Number: [id number] Gender: female Date of Encounter: 03/18/2011  History of Present Illness:  Jenna [name] is a 56 y.o. very pleasant female patient who presents with the following:  Two days ago she noted that her mouth was "breaking out" in some sores- much worse yesterday.  Lesions mostly on her upper lip. The lesions hurt some but not terribly.  Lip feels swollen.  Has had cold sores before but never like this.  No cough, some chills but no measured fevers, + tender LAD in neck.  Diarrhea but this is not new- seems to be part of her lupus/ lupus treatment.  States her diarrhea is not that bad currently  There is no problem list on file for this patient.  No past medical history on file. No past surgical history on file. History  Substance Use Topics  . Smoking status: Never Smoker   . Smokeless tobacco: Not on file  . Alcohol Use: Not on file   No family history on file. Allergies  Allergen Reactions  . Penicillins Other (See Comments)    "feels strange"    Medication list has been reviewed and updated.  Review of Systems: As per HPI, otherwise negative.  No eye symptoms.   Physical Examination: Filed Vitals:   03/18/11 1516  BP: 124/73  Pulse: 91  Temp: 98.6 F (37 C)  TempSrc: Oral  Resp: 16  Height: 5' 5.5" (1.664 m)  Weight: 200 lb (90.719 kg)    Body mass index is 32.78 kg/(m^2).   GEN: WDWN, NAD, Non-toxic, Alert & Oriented x 3, obese HEET: Atraumatic, Normocephalic.  Ears: normal TM bilaterally CV: normal cardiac exam Pulm: CTR bilaterally EXTR: No clubbing/cyanosis/edema NEURO: Normal gait.  PSYCH: Normally interactive. Conversant. Not depressed or anxious appearing.  Calm demeanor.  Mouth/ nose: honey colored crusts over upper lip and bilateral inferior nares.  Oropharynx wnl.  Small lymph nodes in anterior cervical chains Assessment and Plan: 1. Impetigo any site   mupirocin ointment (BACTROBAN) 2 %   As above- appears to have Impetigo.  Will start treatment with mupirocin TID.  Avoid oral abx for now as patient also has history of current/ chronic diarrhea and intolerance to pcn (of note- patient is not truly allergic to PCN and could take a cephalosporin if needed).  She will call if she is not a lot better within about 35 hours.  Would try oral keflex if not better.  Ok to call in as long as she is not worse.

## 2011-03-18 NOTE — Patient Instructions (Signed)
Use ointment 3x daily- if not better within 2 days call and we will start an oral antibiotic

## 2011-03-20 ENCOUNTER — Telehealth: Payer: Self-pay

## 2011-03-20 NOTE — Telephone Encounter (Signed)
Pt states she saw Dr. Lorelei Pont 2 days ago and was told to call if Rx was not working. Pt is not feeling better and would like to know what do do. Also would like to know how long she should wait to feel better. cdc

## 2011-03-20 NOTE — Telephone Encounter (Signed)
Patient states that she is not really feeling any better. Her nose is really raw and ozzy. Please advise if she should do anything different. She wanted to wait for your advice Dr Lorelei Pont.

## 2011-03-21 ENCOUNTER — Telehealth: Payer: Self-pay

## 2011-03-21 MED ORDER — DOXYCYCLINE HYCLATE 100 MG PO CAPS: 100.0000 mg | ORAL_CAPSULE | Freq: Two times a day (BID) | ORAL | Status: AC

## 2011-03-21 NOTE — Telephone Encounter (Signed)
.  [facility] PATIENT WANTS TO ASK DR. COPLAND WHAT IS THE BEST WAY TO TAKE CARE OF HER IMPETIGO? SHE ALSO WANTS TO KNOW WHEN IT SHOULD SCAB OVER?SHE SAID IT IS STILL BLEEDING SOME. WHEN CAN SHE GO BACK TO WORK? BEST PHONE [phone] (CELL) [place]

## 2011-03-22 ENCOUNTER — Ambulatory Visit (INDEPENDENT_AMBULATORY_CARE_PROVIDER_SITE_OTHER): Payer: BC Managed Care – PPO | Admitting: Family Medicine

## 2011-03-22 VITALS — BP 107/71 | HR 88 | Temp 98.3°F | Resp 16 | Ht 66.5 in | Wt 199.0 lb

## 2011-03-22 DIAGNOSIS — L989 Disorder of the skin and subcutaneous tissue, unspecified: Secondary | ICD-10-CM

## 2011-03-22 NOTE — Progress Notes (Addendum)
  Patient Name: Jenna [name] Date of Birth: 1955/12/20 Medical Record Number: [id number] Gender: female Date of Encounter: 03/22/2011  History of Present Illness:  Jenna [name] is a 56 y.o. very pleasant female patient who presents with the following:  Was here on 1/31 and started on mupirocin for impetigo- then called back due to lack of improvement.  Yesterday started on doxycycline and came in for a recheck today.  The area is not any better- has started to bleed easily to touch.  Has not changed distribution.  Otherwise feels about the same.  She has tried to clean/ debride the area but bleeding is a problem.  Has not gone back to work due to appearance of her skin.  There is no problem list on file for this patient.  Past Medical History  Diagnosis Date  . Lupus (systemic lupus erythematosus)   . Obesity    No past surgical history on file. History  Substance Use Topics  . Smoking status: Never Smoker   . Smokeless tobacco: Never Used  . Alcohol Use: No   No family history on file. Allergies  Allergen Reactions  . Penicillins Other (See Comments)    "feels strange"    Medication list has been reviewed and updated.  Review of Systems: As per HPI  Physical Examination: Filed Vitals:   03/22/11 1402  BP: 107/71  Pulse: 88  Temp: 98.3 F (36.8 C)  TempSrc: Oral  Resp: 16  Height: 5' 6.5" (1.689 m)  Weight: 199 lb (90.266 kg)    Body mass index is 31.64 kg/(m^2).   GEN: WDWN, NAD, Non-toxic, Alert & Oriented x 3 HEENT: Atraumatic, Normocephalic.  Ears and Nose: there is crusted/ easily bleeding matter over her upper lip and lower nares.  Attempts to debride result in bleeding.  There is no crusting at this time, no ulcerated lesion.   EXTR: No clubbing/cyanosis/edema NEURO: Normal gait.  PSYCH: Normally interactive. Conversant. Not depressed or anxious appearing.  Calm demeanor.    Assessment and Plan: Skin problem- was thought to be impetigo but has  not responded to treatment and is worse.  Called Dr. Allyson Sabal who very kindly agreed to see patient urgently.  She has been sent over to his office now with directions.  Appreciate consult by Dr. Allyson Sabal.     addnd 2/5:  Called patient today but only phone number available is "temporarily disconnected."   Will look for a note from Dr. Allyson Sabal

## 2011-03-25 ENCOUNTER — Telehealth: Payer: Self-pay

## 2011-03-25 NOTE — Telephone Encounter (Signed)
.  [facility]  Pt has impetigo and would like to discuss findings with specialist Dr. Lorelei Pont

## 2011-03-26 NOTE — Telephone Encounter (Signed)
Pt reports she saw Dr Allyson Sabal same day she was here, but he really didn't help anymore than Dr Lorelei Pont already had. Reports that all impetigo lesions have scabbed over and scabs have fallen off. Wants to know if its OK to RTW on Monday. Advised pt to finish all round of Abx and if no drainage from lesions and formed new skin, she should be fine to RTW. Pt wanted me to thank Dr Lorelei Pont and to let her know status.

## 2011-03-26 NOTE — Telephone Encounter (Signed)
Dr. Lorelei Pont had set up an urgent appt with Dr. Allyson Sabal, dermatologist same day she was seen.  Advise pt should call his office and be seen asap.

## 2011-03-26 NOTE — Telephone Encounter (Signed)
PT CALLED REGARDING IMPETIGO AND STILL HASN'T HEARD FROM ANYONE PLEASE CALL [phone]

## 2011-03-26 NOTE — Telephone Encounter (Signed)
Can we refer pt to a specialist?

## 2011-04-15 ENCOUNTER — Ambulatory Visit: Payer: BC Managed Care – PPO

## 2011-04-15 ENCOUNTER — Ambulatory Visit (INDEPENDENT_AMBULATORY_CARE_PROVIDER_SITE_OTHER): Payer: BC Managed Care – PPO | Admitting: Family Medicine

## 2011-04-15 VITALS — BP 124/80 | HR 105 | Temp 98.6°F | Resp 16 | Wt 198.2 lb

## 2011-04-15 DIAGNOSIS — M329 Systemic lupus erythematosus, unspecified: Secondary | ICD-10-CM

## 2011-04-15 DIAGNOSIS — D649 Anemia, unspecified: Secondary | ICD-10-CM

## 2011-04-15 DIAGNOSIS — D619 Aplastic anemia, unspecified: Secondary | ICD-10-CM

## 2011-04-15 DIAGNOSIS — R0781 Pleurodynia: Secondary | ICD-10-CM

## 2011-04-15 DIAGNOSIS — R1013 Epigastric pain: Secondary | ICD-10-CM

## 2011-04-15 LAB — POCT CBC
Granulocyte percent: 63.9 %G (ref 37–80)
HCT, POC: 28.2 % — AB (ref 37.7–47.9)
Hemoglobin: 8.8 g/dL — AB (ref 12.2–16.2)
Lymph, poc: 1.5 (ref 0.6–3.4)
MCHC: 31.2 g/dL — AB (ref 31.8–35.4)
MPV: 6.6 fL (ref 0–99.8)
POC Granulocyte: 3.1 (ref 2–6.9)
POC MID %: 4.7 %M (ref 0–12)
RBC: 3.46 M/uL — AB (ref 4.04–5.48)

## 2011-04-15 NOTE — Progress Notes (Signed)
Patient Name: Jenna [name] Date of Birth: 05-Sep-1955 Medical Record Number: [id number] Gender: female Date of Encounter: 04/15/2011  History of Present Illness:  Jenna [name] is a 56 y.o. very pleasant female patient who presents with the following:  Notes pain in her right side (at lower rib border) with breathing in for 3 days.  No known injury or unusual activities.  Does not have a cough.  Has felt warm and has a cold with runny nose but otherwise is not ill.  Also notes chills.  Last night she felt short of breath and "I was going to call 911" but she did not and her pain eased off- she thought she might have been having a panic attack which made her symptoms worse.  She notes that she only has the pain if she takes a deep breath- when she breathes normally she is ok, and she is also ok if she leans forward when she takes a deep breath.  Does NOT feel short of breath  No history of PE or DVT, no history of smoking, no travel. No bleeding, no blood in stool. No melena History of lupus but otherwise healthy  Patient Active Problem List  Diagnoses  . Lupus   Past Medical History  Diagnosis Date  . Lupus (systemic lupus erythematosus)   . Obesity    No past surgical history on file. History  Substance Use Topics  . Smoking status: Never Smoker   . Smokeless tobacco: Never Used  . Alcohol Use: No   No family history on file. Allergies  Allergen Reactions  . Penicillins Other (See Comments)    "feels strange"    Medication list has been reviewed and updated.  Review of Systems: As per HPI.  Also notes that she has pain in her left shoulder also with taking a deep breath, and that her left index finger is sore but this seems due to wearing a band- aid too tight  Physical Examination: Filed Vitals:   04/15/11 1937  BP: 124/80  Pulse: 105  Temp: 98.6 F (37 C)  TempSrc: Oral  Resp: 16  Weight: 198 lb 3.2 oz (89.903 kg)  pulse recheck with pulse oximetry = 85    O2 sat 100%  There is no height on file to calculate BMI.  GEN: WDWN, NAD, Non-toxic, A & O x 3 HEENT: Atraumatic, Normocephalic. Neck supple. No masses, No LAD. TM wnl, oropharynx wnl Ears and Nose: No external deformity. CV: RRR, No M/G/R. No JVD. No thrill. No extra heart sounds. PULM: CTA B, no wheezes, crackles, rhonchi. No retractions. No resp. distress. No accessory muscle use. ABD: S, NT, ND. No rebound. No HSM. Negative murphy's sign EXTR: No c/c/e NEURO Normal gait.  PSYCH: Normally interactive. Conversant. Not depressed or anxious appearing.  Calm demeanor.  Has tenderness in her right lower ribs but no redness, swelling or skin lesion  [facility] reading (PRIMARY) by  Dr. Lorelei Pont.  CXR; wnl, NAD.  RIGHT RIBS - 2 VIEW  Comparison: None.  Findings: There is no rib fracture. No pneumothorax or pleural effusion. Slight linear atelectasis at the right lung base.  IMPRESSION: Right ribs appear normal. Minimal right base atelectasis.  Original Report Authenticated By: Larey Seat, M.D.  Results for orders placed in visit on 04/15/11  POCT CBC      Component Value Range   WBC 4.8  4.6 - 10.2 (K/uL)   Lymph, poc 1.5  0.6 - 3.4    POC LYMPH  PERCENT 31.4  10 - 50 (%L)   MID (cbc) 0.2  0 - 0.9    POC MID % 4.7  0 - 12 (%M)   POC Granulocyte 3.1  2 - 6.9    Granulocyte percent 63.9  37 - 80 (%G)   RBC 3.46 (*) 4.04 - 5.48 (M/uL)   Hemoglobin 8.8 (*) 12.2 - 16.2 (g/dL)   HCT, POC 28.2 (*) 37.7 - 47.9 (%)   MCV 81.5  80 - 97 (fL)   MCH, POC 25.4 (*) 27 - 31.2 (pg)   MCHC 31.2 (*) 31.8 - 35.4 (g/dL)   RDW, POC 16.9     Platelet Count, POC 325  142 - 424 (K/uL)   MPV 6.6  0 - 99.8 (fL)    Assessment and Plan: 1. Rib pain  DG Chest 2 View, DG Ribs Unilateral Right, POCT CBC  2. Lupus    3. Anemia     Patient has a history of persistent anemia- her last hg was 8.9 about one year ago.  This may be due to her plaquenil.  She has follow- up with her rheumatologist later  on this month. Normal WBC is reassuring that she is not having acute galbladder disease.  Her pain does seem to be MSK in origin, but discussed the fact that I cannot rule- out a PE or other more serious etiology.  However, she declined ED evaluation at this time.  She will use her pain medicaition as needed and will let me know if she is not better within 2 days.  If she gets worse she will seek care at the ED right away.    Called to check on patient 04/16/11- she is feeling better! She went to work today.  Discussed her anemia with her again- this may be due to plaquenil but I will touch base with her rheumatologist.  She will let us know if she gets worse again or if she does not continue to improve.

## 2011-04-16 ENCOUNTER — Encounter: Payer: Self-pay | Admitting: Family Medicine

## 2011-04-21 ENCOUNTER — Telehealth: Payer: Self-pay | Admitting: Family Medicine

## 2011-04-22 NOTE — Telephone Encounter (Signed)
Called Dr. Melissa Noon office at [facility]- Dr. Amil Amen is out of the office but one of his partners will look at [facility] chart and call me re: anemia due to her plaquenil or not.  [place]

## 2011-04-28 ENCOUNTER — Ambulatory Visit: Payer: BC Managed Care – PPO

## 2011-04-28 ENCOUNTER — Ambulatory Visit (INDEPENDENT_AMBULATORY_CARE_PROVIDER_SITE_OTHER): Payer: BC Managed Care – PPO | Admitting: Family Medicine

## 2011-04-28 VITALS — BP 133/82 | HR 78 | Temp 98.2°F | Resp 18 | Ht 66.0 in | Wt 201.6 lb

## 2011-04-28 DIAGNOSIS — R109 Unspecified abdominal pain: Secondary | ICD-10-CM

## 2011-04-28 DIAGNOSIS — N200 Calculus of kidney: Secondary | ICD-10-CM

## 2011-04-28 DIAGNOSIS — R1011 Right upper quadrant pain: Secondary | ICD-10-CM

## 2011-04-28 LAB — POCT CBC
Granulocyte percent: 74.2 % (ref 37–80)
HCT, POC: 26.5 % — AB (ref 37.7–47.9)
Hemoglobin: 8.2 g/dL — AB (ref 12.2–16.2)
Lymph, poc: 1.1 (ref 0.6–3.4)
MCH, POC: 25.5 pg — AB (ref 27–31.2)
MCHC: 30.9 g/dL — AB (ref 31.8–35.4)
MCV: 82.4 fL (ref 80–97)
MID (cbc): 0.2 (ref 0–0.9)
MPV: 7.1 fL (ref 0–99.8)
POC Granulocyte: 3.7 (ref 2–6.9)
POC LYMPH PERCENT: 22.4 %L (ref 10–50)
POC MID %: 3.4 %M (ref 0–12)
Platelet Count, POC: 409 10*3/uL (ref 142–424)
RBC: 3.21 M/uL — AB (ref 4.04–5.48)
RDW, POC: 17.4 %
WBC: 5 10*3/uL (ref 4.6–10.2)

## 2011-04-28 LAB — POCT UA - MICROSCOPIC ONLY
Casts, Ur, LPF, POC: NEGATIVE
Crystals, Ur, HPF, POC: NEGATIVE
Mucus, UA: NEGATIVE
Yeast, UA: NEGATIVE

## 2011-04-28 LAB — POCT URINALYSIS DIPSTICK
Bilirubin, UA: NEGATIVE
Glucose, UA: NEGATIVE
Ketones, UA: NEGATIVE
Leukocytes, UA: NEGATIVE
Nitrite, UA: NEGATIVE
Protein, UA: >=300
Spec Grav, UA: 1.015
Urobilinogen, UA: 0.2
pH, UA: 5.5

## 2011-04-28 MED ORDER — HYDROCODONE-ACETAMINOPHEN 5-500 MG PO TABS: 1.0000 | ORAL_TABLET | Freq: Three times a day (TID) | ORAL | Status: AC | PRN

## 2011-04-28 MED ORDER — HYDROCODONE-ACETAMINOPHEN 5-500 MG PO TABS: 1.0000 | ORAL_TABLET | Freq: Three times a day (TID) | ORAL | Status: DC | PRN

## 2011-04-28 NOTE — Progress Notes (Signed)
[facility]:  Office Visit  Chief Complaint:  Chief Complaint  Patient presents with  . Back Pain    lower-R side x 2 wks    HPI: Jenna [name] is a 56 y.o. female who complains of  2 week h/o right upper quad abd pain and flank pain, ? Dysuria, no fevers, chills. Still able to make urine without difficulty, no pedal edema. Does not feel like her lupus pain. H/o of kidney stone. She also had a renal bx in 2011  for acute renal failure and it showed focal changes secondary to her lupus. Creatine was high in the 3.17 when 6 months prior it was 0.81.  Past Medical History  Diagnosis Date  . Lupus (systemic lupus erythematosus)   . Obesity   . Renal stone    No past surgical history on file. History   Social History  . Marital Status: Legally Separated    Spouse Name: N/A    Number of Children: N/A  . Years of Education: N/A   Social History Main Topics  . Smoking status: Never Smoker   . Smokeless tobacco: Never Used  . Alcohol Use: No  . Drug Use: No  . Sexually Active: None   Other Topics Concern  . None   Social History Narrative  . None   Family History  Problem Relation Age of Onset  . Diabetes Mother   . Hypertension Father   . Cancer Sister   . Hypertension Brother    Allergies  Allergen Reactions  . Penicillins Other (See Comments)    "feels strange"   Prior to Admission medications   Medication Sig Start Date End Date Taking? Authorizing Provider  HYDROCODONE-ACETAMINOPHEN PO Take by mouth as needed.   Yes Historical Provider, MD  hydroxychloroquine (PLAQUENIL) 200 MG tablet Take 200 mg by mouth 2 (two) times daily.   Yes Historical Provider, MD  predniSONE (DELTASONE) 10 MG tablet Take 10 mg by mouth daily.   Yes Historical Provider, MD     ROS: The patient denies fevers, chills, night sweats, unintentional weight loss, chest pain, palpitations, wheezing, dyspnea on exertion, nausea, vomiting, abdominal pain, dysuria,  hematuria, melena, numbness, weakness, or tingling. +back pain  All other systems have been reviewed and were otherwise negative with the exception of those mentioned in the HPI and as above.    PHYSICAL EXAM: Filed Vitals:   04/28/11 1748  BP: 133/82  Pulse: 78  Temp: 98.2 F (36.8 C)  Resp: 18   Filed Vitals:   04/28/11 1748  Height: $Remove'5\' 6"'paUEmIq$  (1.676 m)  Weight: 201 lb 9.6 oz (91.445 kg)   Body mass index is 32.54 kg/(m^2).  General: Alert, mild distress HEENT:  Normocephalic, atraumatic, oropharynx patent. Tm nl, EOMI, PERRLA Cardiovascular:  Regular rate and rhythm, no rubs murmurs or gallops.  No Carotid bruits, radial pulse intact. No pedal edema.  Respiratory: Clear to auscultation bilaterally.  No wheezes, rales, or rhonchi.  No cyanosis, no use of accessory musculature GI: No organomegaly, abdomen is soft and non-tender, positive bowel sounds.  No masses. Skin: No rashes. Neurologic: Facial musculature symmetric. Psychiatric: Patient is appropriate throughout our interaction. Lymphatic: No cervical lymphadenopathy Musculoskeletal: Gait intact. + right sided CVA tenderness   LABS: Results for orders placed in visit on 04/28/11  POCT CBC      Component Value Range   WBC 5.0  4.6 - 10.2 (K/uL)   Lymph, poc 1.1  0.6 - 3.4  POC LYMPH PERCENT 22.4  10 - 50 (%L)   MID (cbc) 0.2  0 - 0.9    POC MID % 3.4  0 - 12 (%M)   POC Granulocyte 3.7  2 - 6.9    Granulocyte percent 74.2  37 - 80 (%G)   RBC 3.21 (*) 4.04 - 5.48 (M/uL)   Hemoglobin 8.2 (*) 12.2 - 16.2 (g/dL)   HCT, POC 26.5 (*) 37.7 - 47.9 (%)   MCV 82.4  80 - 97 (fL)   MCH, POC 25.5 (*) 27 - 31.2 (pg)   MCHC 30.9 (*) 31.8 - 35.4 (g/dL)   RDW, POC 17.4     Platelet Count, POC 409  142 - 424 (K/uL)   MPV 7.1  0 - 99.8 (fL)  POCT URINALYSIS DIPSTICK      Component Value Range   Color, UA yellow     Clarity, UA clear     Glucose, UA neg     Bilirubin, UA neg     Ketones, UA neg     Spec Grav, UA 1.015      Blood, UA trace     pH, UA 5.5     Protein, UA >=300     Urobilinogen, UA 0.2     Nitrite, UA neg     Leukocytes, UA Negative    POCT UA - MICROSCOPIC ONLY      Component Value Range   WBC, Ur, HPF, POC 0-2     RBC, urine, microscopic 0-1     Bacteria, U Microscopic trace     Mucus, UA neg     Epithelial cells, urine per micros 0-1     Crystals, Ur, HPF, POC neg     Casts, Ur, LPF, POC neg     Yeast, UA neg       EKG/XRAY:   Primary read interpreted by Dr. Marin Comment at [facility]. CXR and Abd xray no acute abnormalities   ASSESSMENT/PLAN: Encounter Diagnoses  Name Primary?  . Abdominal pain, acute, right upper quadrant Yes  . Flank pain    ? Kidney stone Will rx Vocodin and encourage fluids, give urine strainer. CBC nl, CMP pending. If sxs not improved then will get CT abdomen.     Exavier Lina, [place], DO 04/28/2011 6:48 PM

## 2011-04-29 LAB — COMPREHENSIVE METABOLIC PANEL
ALT: 17 U/L (ref 0–35)
AST: 28 U/L (ref 0–37)
BUN: 20 mg/dL (ref 6–23)
Calcium: 8.2 mg/dL — ABNORMAL LOW (ref 8.4–10.5)
Chloride: 102 mEq/L (ref 96–112)
Creat: 1.52 mg/dL — ABNORMAL HIGH (ref 0.50–1.10)
Total Bilirubin: 0.1 mg/dL — ABNORMAL LOW (ref 0.3–1.2)

## 2011-04-29 LAB — COMPREHENSIVE METABOLIC PANEL WITH GFR
Albumin: 2.8 g/dL — ABNORMAL LOW (ref 3.5–5.2)
Alkaline Phosphatase: 42 U/L (ref 39–117)
CO2: 25 meq/L (ref 19–32)
Glucose, Bld: 97 mg/dL (ref 70–99)
Potassium: 4.6 meq/L (ref 3.5–5.3)
Sodium: 134 meq/L — ABNORMAL LOW (ref 135–145)
Total Protein: 7.8 g/dL (ref 6.0–8.3)

## 2012-04-26 ENCOUNTER — Other Ambulatory Visit (HOSPITAL_COMMUNITY)
Admission: RE | Admit: 2012-04-26 | Discharge: 2012-04-26 | Disposition: A | Discharge status: Home / Self Care | Payer: BC Managed Care – PPO | Source: Ambulatory Visit | Attending: Internal Medicine | Admitting: Internal Medicine

## 2012-04-26 ENCOUNTER — Other Ambulatory Visit (HOSPITAL_COMMUNITY): Payer: Self-pay | Admitting: Internal Medicine

## 2012-04-26 ENCOUNTER — Other Ambulatory Visit: Payer: Self-pay

## 2012-04-26 ENCOUNTER — Other Ambulatory Visit: Payer: Self-pay | Admitting: Registered Nurse

## 2012-04-26 DIAGNOSIS — Z1231 Encounter for screening mammogram for malignant neoplasm of breast: Secondary | ICD-10-CM

## 2012-04-26 DIAGNOSIS — Z01419 Encounter for gynecological examination (general) (routine) without abnormal findings: Secondary | ICD-10-CM | POA: Insufficient documentation

## 2012-04-28 ENCOUNTER — Ambulatory Visit (HOSPITAL_COMMUNITY)
Admission: RE | Admit: 2012-04-28 | Discharge: 2012-04-28 | Disposition: A | Discharge status: Home / Self Care | Payer: BC Managed Care – PPO | Source: Ambulatory Visit | Attending: Internal Medicine | Admitting: Internal Medicine

## 2012-04-28 DIAGNOSIS — Z1231 Encounter for screening mammogram for malignant neoplasm of breast: Secondary | ICD-10-CM | POA: Insufficient documentation

## 2012-07-09 ENCOUNTER — Telehealth: Payer: Self-pay | Admitting: *Deleted

## 2012-07-09 NOTE — Telephone Encounter (Signed)
Called patient in regards to [facility] letter we received. Pt is on Norvasc and drinks a cup of grapefruit juice q day. Advised pt to only drink once in a while- medication interacts with grapefruit. Pt states that she does only drink once in a while as she heard it is full of sugar. She understands the interaction concern.

## 2013-05-04 ENCOUNTER — Emergency Department (HOSPITAL_COMMUNITY): Payer: Self-pay

## 2013-05-04 ENCOUNTER — Observation Stay (HOSPITAL_COMMUNITY)
Admission: EM | Admit: 2013-05-04 | Discharge: 2013-05-06 | Disposition: A | Discharge status: Home / Self Care | Payer: Self-pay | Attending: Internal Medicine | Admitting: Internal Medicine

## 2013-05-04 ENCOUNTER — Encounter (HOSPITAL_COMMUNITY): Payer: Self-pay | Admitting: Emergency Medicine

## 2013-05-04 DIAGNOSIS — Z87442 Personal history of urinary calculi: Secondary | ICD-10-CM | POA: Insufficient documentation

## 2013-05-04 DIAGNOSIS — R200 Anesthesia of skin: Secondary | ICD-10-CM | POA: Diagnosis present

## 2013-05-04 DIAGNOSIS — E669 Obesity, unspecified: Secondary | ICD-10-CM | POA: Insufficient documentation

## 2013-05-04 DIAGNOSIS — Z880 Allergy status to penicillin: Secondary | ICD-10-CM | POA: Insufficient documentation

## 2013-05-04 DIAGNOSIS — M329 Systemic lupus erythematosus, unspecified: Secondary | ICD-10-CM

## 2013-05-04 DIAGNOSIS — R209 Unspecified disturbances of skin sensation: Secondary | ICD-10-CM

## 2013-05-04 DIAGNOSIS — R202 Paresthesia of skin: Secondary | ICD-10-CM

## 2013-05-04 DIAGNOSIS — G459 Transient cerebral ischemic attack, unspecified: Principal | ICD-10-CM

## 2013-05-04 LAB — BASIC METABOLIC PANEL
BUN: 32 mg/dL — ABNORMAL HIGH (ref 6–23)
CHLORIDE: 101 meq/L (ref 96–112)
CO2: 24 mEq/L (ref 19–32)
Calcium: 8.8 mg/dL (ref 8.4–10.5)
Creatinine, Ser: 2.21 mg/dL — ABNORMAL HIGH (ref 0.50–1.10)
GFR, EST AFRICAN AMERICAN: 27 mL/min — AB (ref >90)
GFR, EST NON AFRICAN AMERICAN: 24 mL/min — AB (ref >90)
GLUCOSE: 99 mg/dL (ref 70–99)
POTASSIUM: 4.4 meq/L (ref 3.7–5.3)
Sodium: 138 mEq/L (ref 137–147)

## 2013-05-04 LAB — URINALYSIS, ROUTINE W REFLEX MICROSCOPIC
Bilirubin Urine: NEGATIVE
Glucose, UA: NEGATIVE mg/dL
Hgb urine dipstick: NEGATIVE
Ketones, ur: NEGATIVE mg/dL
Nitrite: NEGATIVE
PROTEIN: NEGATIVE mg/dL
Specific Gravity, Urine: 1.013 (ref 1.005–1.030)
UROBILINOGEN UA: 0.2 mg/dL (ref 0.0–1.0)
pH: 5 (ref 5.0–8.0)

## 2013-05-04 LAB — CBC WITH DIFFERENTIAL/PLATELET
BASOS ABS: 0 10*3/uL (ref 0.0–0.1)
Basophils Relative: 0 % (ref 0–1)
Eosinophils Absolute: 0.1 10*3/uL (ref 0.0–0.7)
Eosinophils Relative: 3 % (ref 0–5)
HEMATOCRIT: 30.8 % — AB (ref 36.0–46.0)
Hemoglobin: 10 g/dL — ABNORMAL LOW (ref 12.0–15.0)
LYMPHS PCT: 24 % (ref 12–46)
Lymphs Abs: 0.9 10*3/uL (ref 0.7–4.0)
MCH: 27.4 pg (ref 26.0–34.0)
MCHC: 32.5 g/dL (ref 30.0–36.0)
MCV: 84.4 fL (ref 78.0–100.0)
Monocytes Absolute: 0.1 10*3/uL (ref 0.1–1.0)
Monocytes Relative: 3 % (ref 3–12)
NEUTROS ABS: 2.5 10*3/uL (ref 1.7–7.7)
NEUTROS PCT: 69 % (ref 43–77)
PLATELETS: 150 10*3/uL (ref 150–400)
RBC: 3.65 MIL/uL — ABNORMAL LOW (ref 3.87–5.11)
RDW: 14.9 % (ref 11.5–15.5)
WBC: 3.6 10*3/uL — AB (ref 4.0–10.5)

## 2013-05-04 LAB — URINE MICROSCOPIC-ADD ON

## 2013-05-04 LAB — I-STAT TROPONIN, ED: TROPONIN I, POC: 0.01 ng/mL (ref 0.00–0.08)

## 2013-05-04 LAB — CBG MONITORING, ED: Glucose-Capillary: 97 mg/dL (ref 70–99)

## 2013-05-04 MED ORDER — ASPIRIN 325 MG PO TABS
325.0000 mg | ORAL_TABLET | Freq: Once | ORAL | Status: AC
Start: 2013-05-04 — End: 2013-05-04
  Administered 2013-05-04: 325 mg via ORAL
  Filled 2013-05-04: qty 1

## 2013-05-04 NOTE — ED Notes (Signed)
CBG taken = 97 

## 2013-05-04 NOTE — ED Notes (Signed)
Family Contact Information [phone] [name] Mynhier (Daughter)

## 2013-05-04 NOTE — H&P (Signed)
[facility] History and Physical  Patient: Jenna [name]  HXT:[id number]  DOB: [birth year]  DOS: the patient was seen and examined on 05/04/2013 PCP: No primary provider on file.  Chief Complaint: Left-sided numbness  HPI: Jenna [name] is a 58 y.o. female with Past medical history of lupus. The patient is coming from home. The patient presented with complaints of left-sided numbness that has been ongoing on and off since last one week. Earlier this morning when she woke up her symptoms were completely resolved but at around 10:30 she started having complaints of left-sided facial numbness as well as left upper extremity numbness which progressed to left lower extended numbness and persistent until she presented to the ER. At the time of my evaluation she continues to have similar complaints but in a mild intensity. She denies any weakness, tingling but does mention that it feels like pinprick sensation artery on the day needed she denies any nausea or vomiting blurring of the vision, difficulty swallowing, chest pain, palpitation, dizziness, lightheadedness, diarrhea, change in her medications, similar episodes in the past. She complains of frontal headache without any photophobia or phonophobia. She does not have any neck stiffness.  Review of Systems: as mentioned in the history of present illness.  A Comprehensive review of the other systems is negative.  Past Medical History  Diagnosis Date  . Lupus (systemic lupus erythematosus)   . Obesity   . Renal stone    History reviewed. No pertinent past surgical history. Social History:  reports that she has never smoked. She has never used smokeless tobacco. She reports that she does not drink alcohol or use illicit drugs. Independent for most of her  ADL.  Allergies  Allergen Reactions  . Penicillins Other (See Comments)    Light headed    Family History  Problem Relation Age of Onset  . Diabetes Mother   . Hypertension  Father   . Cancer Sister   . Hypertension Brother     Prior to Admission medications   Medication Sig Start Date End Date Taking? Authorizing Provider  acetaminophen (TYLENOL) 500 MG tablet Take 1,000 mg by mouth 3 (three) times daily as needed (pain).   Yes Historical Provider, MD  HYDROcodone-acetaminophen (NORCO) 7.5-325 MG per tablet Take 1 tablet by mouth 2 (two) times daily as needed (pain).   Yes Historical Provider, MD  hydroxychloroquine (PLAQUENIL) 200 MG tablet Take 200 mg by mouth 2 (two) times daily.   Yes Historical Provider, MD    Physical Exam: Filed Vitals:   05/04/13 2245 05/04/13 2315 05/04/13 2330 05/04/13 2345  BP: 163/90 165/81 155/81 141/79  Pulse: 79 78 82 78  Temp:      TempSrc:      Resp: $Remo'17 22 18 21  'hZgcN$ Height:      Weight:      SpO2: 100% 100% 100% 99%    General: Alert, Awake and Oriented to Time, Place and Person. Appear in mild distress Eyes: PERRL ENT: Oral Mucosa clear moist. Neck: No JVD Cardiovascular: S1 and S2 Present, no Murmur, Peripheral Pulses Present Respiratory: Bilateral Air entry equal and Decreased, Clear to Auscultation,  No Crackles, no wheezes Abdomen: Bowel Sound Present, Soft and Non tender Skin: No Rash Extremities: No Pedal edema, no calf tenderness Neurologic: Grossly Unremarkable. Other than left-sided numbness, in mild left upper extremity weakness as compared to the right  Labs on Admission:  CBC:  Recent Labs Lab 05/04/13 2117  WBC 3.6*  NEUTROABS 2.5  HGB  10.0*  HCT 30.8*  MCV 84.4  PLT 150    CMP     Component Value Date/Time   NA 138 05/04/2013 2117   K 4.4 05/04/2013 2117   CL 101 05/04/2013 2117   CO2 24 05/04/2013 2117   GLUCOSE 99 05/04/2013 2117   BUN 32* 05/04/2013 2117   CREATININE 2.21* 05/04/2013 2117   CREATININE 1.52* 04/28/2011 1837   CALCIUM 8.8 05/04/2013 2117   CALCIUM 6.9* 01/23/2010 0616   PROT 7.8 04/28/2011 1837   ALBUMIN 2.8* 04/28/2011 1837   AST 28 04/28/2011 1837   ALT 17 04/28/2011  1837   ALKPHOS 42 04/28/2011 1837   BILITOT 0.1* 04/28/2011 1837   GFRNONAA 24* 05/04/2013 2117   GFRAA 27* 05/04/2013 2117    No results found for this basename: LIPASE, AMYLASE,  in the last 168 hours No results found for this basename: AMMONIA,  in the last 168 hours  No results found for this basename: CKTOTAL, CKMB, CKMBINDEX, TROPONINI,  in the last 168 hours BNP (last 3 results) No results found for this basename: PROBNP,  in the last 8760 hours  Radiological Exams on Admission: Ct Head Wo Contrast  05/04/2013   CLINICAL DATA:  Severe headache. Left-sided face and arm numbness. Hypertension. Systemic lupus erythematosus.  EXAM: CT HEAD WITHOUT CONTRAST  TECHNIQUE: Contiguous axial images were obtained from the base of the skull through the vertex without intravenous contrast.  COMPARISON:  None.  FINDINGS: No evidence of intracranial hemorrhage, brain edema, or other signs of acute infarction. No evidence of intracranial mass lesion or mass effect. No abnormal extraaxial fluid collections identified. Ventricles are normal in size. No skull abnormality identified.  IMPRESSION: Negative noncontrast head CT.   Electronically Signed   By: Earle Gell M.D.   On: 05/04/2013 22:02    EKG: Independently reviewed. normal EKG, normal sinus rhythm, nonspecific ST and T waves changes.  Assessment/Plan Principal Problem:   TIA (transient ischemic attack) Active Problems:   Lupus   Numbness and tingling of left side of face   1. TIA (transient ischemic attack) The patient is presenting with complaints of left-sided numbness her symptom has started around 10:00 in the morning thus she is out of the window better for any aggressive management. Her CT scan is also negative for any acute changes. She would be able to the hospital for further workup. We'll obtain MRI, sooner telemetry, serial neuro checks, echocardiogram in the morning, corrected to prior. Neurology has been consulted I would add  aspirin since she has history of lupus  2. lupus Continue Plaquenil  Consults: Neurology  DVT Prophylaxis: subcutaneous Heparin Nutrition: Cardiac diet  Code Status: Full  Family Communication: Family was present at bedside, opportunity was given to ask question and all questions were answered satisfactorily at the time of interview. Disposition: Admitted to observation in telemetry unit.  Author: Berle Mull, MD [facility] Pager: [phone] 05/04/2013, 11:55 PM    If 7PM-7AM, please contact night-coverage [email or web address] Password [email or web address]

## 2013-05-04 NOTE — ED Provider Notes (Signed)
CSN: [id number]     Arrival date & time 05/04/13  1509 History   First MD Initiated Contact with Patient 05/04/13 2035     Chief Complaint  Patient presents with  . Numbness   HPI  58 year old female with history of lupus presents complaining of left-sided facial numbness. Onset was several hours prior to arrival, approximately 11 AM to noon. The sensation is described as "like when the dentist numbness your mouth." It lasted for several hours. It resolved completely shortly after arrival to the emergency department while the patient was in triage. She has no other neurological symptoms. She denies visual changes, difficulty with speech, difficulty with gait or balance or coordination. She denies any weakness. Denies any facial droop. She denies any numbness or paresthesias in her arms or legs. She's never had similar symptoms in the past. She's never had a stroke or TIA. Just a denies a history of coronary artery disease, peripheral vascular disease. She states that she has anxiety but she does not think that she had a panic attack. She did not seem to have other symptoms of a panic attack.  On arrival to emergency department, the patient states that she is back to baseline, symptom free, and her neurological exam is nonfocal as documented.   Past Medical History  Diagnosis Date  . Lupus (systemic lupus erythematosus)   . Obesity   . Renal stone    History reviewed. No pertinent past surgical history. Family History  Problem Relation Age of Onset  . Diabetes Mother   . Hypertension Father   . Cancer Sister   . Hypertension Brother    History  Substance Use Topics  . Smoking status: Never Smoker   . Smokeless tobacco: Never Used  . Alcohol Use: No   OB History   Grav Para Term Preterm Abortions TAB SAB Ect Mult Living                 Review of Systems  Constitutional: Negative for fever, chills and diaphoresis.  HENT: Negative for congestion and rhinorrhea.   Respiratory:  Negative for cough, shortness of breath and wheezing.   Cardiovascular: Negative for chest pain and leg swelling.  Gastrointestinal: Negative for nausea, vomiting, abdominal pain and diarrhea.  Genitourinary: Negative for dysuria, urgency, frequency, flank pain, vaginal bleeding, vaginal discharge and difficulty urinating.  Musculoskeletal: Negative for neck pain and neck stiffness.  Skin: Negative for rash.  Neurological: Positive for numbness (left face). Negative for weakness and headaches.  All other systems reviewed and are negative.      Allergies  Penicillins  Home Medications   No current outpatient prescriptions on file. BP 154/88  Pulse 86  Temp(Src) 98.3 F (36.8 C) (Oral)  Resp 18  Ht $R'5\' 7"'XA$  (1.702 m)  Wt 206 lb 5.6 oz (93.6 kg)  BMI 32.31 kg/m2  SpO2 97% Physical Exam GENERAL: well developed, well nourished, no acute distress HEENT: atraumatic; normocephalic.  PERRL.  EOMI.  Sclera normal.  Mucus membranes moist.  Oropharynx clear.   NECK: supple.  No JVD.  Normal ROM. CARDIOVASCULAR: heart regular of rate and rhythm.  Normal heart sounds with no murmur, gallop, or rub.  Intact, strong, and bilaterally equal distal pulses.  Skin warm and dry.  No peripheral edema. PULMONARY: chest clear to auscultation bilaterally.  No wheezes, rales, or rhonci.  Normal work of breathing. ABDOMEN: soft, non-distended, non-tender.  No pulsatile mass. GU: deferred MUSCULOSKELETAL: atraumatic; no edema NEURO: alert and oriented X 4.  CN 2-12 intact.  5/5 strength in bilateral upper extremities and lower extremities, bilaterally symmetric.  Normal sensation to light touch in bilateral upper and lower extremities, symmetric.  Normal tone.  Normal finger to nose and heel to shin test.  Negative Romberg.  Normal gait with no ataxia.  2+ bilateral patellar reflexes, symmetric.  No clonus.  Visual fields intact to confrontation. SKIN: warm and dry with no noted rash, abscess, or  cellulitis PSYCH: normal mood and affect; normal memory to recent events  ED Course  Procedures (including critical care time) Labs Review Labs Reviewed  CBC WITH DIFFERENTIAL - Abnormal; Notable for the following:    WBC 3.6 (*)    RBC 3.65 (*)    Hemoglobin 10.0 (*)    HCT 30.8 (*)    All other components within normal limits  BASIC METABOLIC PANEL - Abnormal; Notable for the following:    BUN 32 (*)    Creatinine, Ser 2.21 (*)    GFR calc non Af Amer 24 (*)    GFR calc Af Amer 27 (*)    All other components within normal limits  URINALYSIS, ROUTINE W REFLEX MICROSCOPIC - Abnormal; Notable for the following:    Leukocytes, UA SMALL (*)    All other components within normal limits  URINE MICROSCOPIC-ADD ON  CBG MONITORING, ED  I-STAT TROPOININ, ED   Imaging Review Ct Head Wo Contrast  05/04/2013   CLINICAL DATA:  Severe headache. Left-sided face and arm numbness. Hypertension. Systemic lupus erythematosus.  EXAM: CT HEAD WITHOUT CONTRAST  TECHNIQUE: Contiguous axial images were obtained from the base of the skull through the vertex without intravenous contrast.  COMPARISON:  None.  FINDINGS: No evidence of intracranial hemorrhage, brain edema, or other signs of acute infarction. No evidence of intracranial mass lesion or mass effect. No abnormal extraaxial fluid collections identified. Ventricles are normal in size. No skull abnormality identified.  IMPRESSION: Negative noncontrast head CT.   Electronically Signed   By: Earle Gell M.D.   On: 05/04/2013 22:02     EKG Interpretation   Date/Time:  Friday May 04 2013 21:15:40 EDT Ventricular Rate:  76 PR Interval:  158 QRS Duration: 120 QT Interval:  414 QTC Calculation: 465 R Axis:   -38 Text Interpretation:  Sinus rhythm Nonspecific IVCD with LAD Left  ventricular hypertrophy Anterior Q waves, possibly due to LVH Borderline T  abnormalities, inferior leads Confirmed by DOCHERTY  MD, MEGAN [id number]) on  05/04/2013 10:20:13  PM      MDM   58 year old female with lupus presents complaining of left-sided facial numbness that started several hours prior to arrival and has resolved by the time the patient is bedridden the emergency department. She states she is completely back to baseline. She denied any other neurological symptoms. She does her blood pressure was in the 160s which is high for her but it has improved. She's never had a stroke or TIA or other vascular disease.  EKG demonstrates sinus rhythm with intraventricular conduction delay some nonspecific T wave abnormalities.  CT head without contrast demonstrates no acute intracranial abnormality.  Labs are pertinent for white blood cell count 3.6, hemoglobin 10, essentially normal metabolic panel save for creatinine of 2.2, normal urinalysis.  The patient was further workup for possible TIA. She is admitted to the hospital service for monitoring and restrictive condition. She's given an aspirin in the emergency department, 325 mg.   Final diagnoses:  TIA (transient ischemic attack)  Wendall Papa, MD 05/05/13 1330

## 2013-05-04 NOTE — ED Notes (Signed)
Patient transported to CT 

## 2013-05-04 NOTE — ED Notes (Signed)
Pt here with c/o numbness to the left side of her face and some arm numbness , the arm numbness is gone and the face numbness is still there , pt does have a history of lupus and was taking off of her b/p meds

## 2013-05-04 NOTE — ED Notes (Signed)
Patient returned from CT

## 2013-05-04 NOTE — ED Notes (Signed)
Pt "had panic attack with increased SBP160"    Also c/o slight numbness left cheek, inside mouth not affected.  Also back pain right side that is "stabbing pain" when she takes a deep breath.

## 2013-05-04 NOTE — ED Notes (Signed)
EKG being completed.  Lab drawing blood.

## 2013-05-05 ENCOUNTER — Observation Stay (HOSPITAL_COMMUNITY): Payer: Medicaid Other

## 2013-05-05 DIAGNOSIS — G459 Transient cerebral ischemic attack, unspecified: Secondary | ICD-10-CM | POA: Diagnosis present

## 2013-05-05 LAB — COMPREHENSIVE METABOLIC PANEL
ALT: 14 U/L (ref 0–35)
AST: 23 U/L (ref 0–37)
Albumin: 3.1 g/dL — ABNORMAL LOW (ref 3.5–5.2)
Alkaline Phosphatase: 67 U/L (ref 39–117)
BUN: 30 mg/dL — ABNORMAL HIGH (ref 6–23)
CO2: 23 meq/L (ref 19–32)
CREATININE: 2.16 mg/dL — AB (ref 0.50–1.10)
Calcium: 9.1 mg/dL (ref 8.4–10.5)
Chloride: 104 mEq/L (ref 96–112)
GFR, EST AFRICAN AMERICAN: 28 mL/min — AB (ref >90)
GFR, EST NON AFRICAN AMERICAN: 24 mL/min — AB (ref >90)
GLUCOSE: 85 mg/dL (ref 70–99)
Potassium: 4.8 mEq/L (ref 3.7–5.3)
Sodium: 139 mEq/L (ref 137–147)
Total Bilirubin: 0.3 mg/dL (ref 0.3–1.2)
Total Protein: 8.1 g/dL (ref 6.0–8.3)

## 2013-05-05 LAB — RAPID URINE DRUG SCREEN, HOSP PERFORMED
Amphetamines: NOT DETECTED
BENZODIAZEPINES: NOT DETECTED
Barbiturates: NOT DETECTED
Cocaine: NOT DETECTED
Opiates: NOT DETECTED
Tetrahydrocannabinol: NOT DETECTED

## 2013-05-05 LAB — CBC WITH DIFFERENTIAL/PLATELET
BASOS ABS: 0 10*3/uL (ref 0.0–0.1)
Basophils Relative: 0 % (ref 0–1)
EOS PCT: 3 % (ref 0–5)
Eosinophils Absolute: 0.1 10*3/uL (ref 0.0–0.7)
HCT: 32.2 % — ABNORMAL LOW (ref 36.0–46.0)
Hemoglobin: 10.4 g/dL — ABNORMAL LOW (ref 12.0–15.0)
LYMPHS ABS: 0.6 10*3/uL — AB (ref 0.7–4.0)
LYMPHS PCT: 23 % (ref 12–46)
MCH: 27.4 pg (ref 26.0–34.0)
MCHC: 32.3 g/dL (ref 30.0–36.0)
MCV: 84.7 fL (ref 78.0–100.0)
MONO ABS: 0 10*3/uL — AB (ref 0.1–1.0)
Monocytes Relative: 2 % — ABNORMAL LOW (ref 3–12)
Neutro Abs: 1.9 10*3/uL (ref 1.7–7.7)
Neutrophils Relative %: 72 % (ref 43–77)
Platelets: 133 10*3/uL — ABNORMAL LOW (ref 150–400)
RBC: 3.8 MIL/uL — ABNORMAL LOW (ref 3.87–5.11)
RDW: 14.8 % (ref 11.5–15.5)
WBC: 2.7 10*3/uL — AB (ref 4.0–10.5)

## 2013-05-05 LAB — LIPID PANEL
Cholesterol: 121 mg/dL (ref 0–200)
HDL: 30 mg/dL — ABNORMAL LOW (ref >39)
LDL CALC: 70 mg/dL (ref 0–99)
TRIGLYCERIDES: 105 mg/dL (ref <150)
Total CHOL/HDL Ratio: 4 RATIO
VLDL: 21 mg/dL (ref 0–40)

## 2013-05-05 LAB — HEMOGLOBIN A1C
Hgb A1c MFr Bld: 5.9 % — ABNORMAL HIGH (ref <5.7)
Mean Plasma Glucose: 123 mg/dL — ABNORMAL HIGH (ref <117)

## 2013-05-05 LAB — PROTIME-INR
INR: 1.06 (ref 0.00–1.49)
Prothrombin Time: 13.6 seconds (ref 11.6–15.2)

## 2013-05-05 MED ORDER — HEPARIN SODIUM (PORCINE) 5000 UNIT/ML IJ SOLN
5000.0000 [IU] | Freq: Three times a day (TID) | INTRAMUSCULAR | Status: DC
  Filled 2013-05-05 (×7): qty 1

## 2013-05-05 MED ORDER — ASPIRIN 325 MG PO TABS
325.0000 mg | ORAL_TABLET | Freq: Every day | ORAL | Status: DC
  Administered 2013-05-05 – 2013-05-06 (×2): 325 mg via ORAL
  Filled 2013-05-05 (×2): qty 1

## 2013-05-05 MED ORDER — HYDROCODONE-ACETAMINOPHEN 7.5-325 MG PO TABS
1.0000 | ORAL_TABLET | Freq: Two times a day (BID) | ORAL | Status: DC | PRN
  Administered 2013-05-06: 1 via ORAL
  Filled 2013-05-05: qty 1

## 2013-05-05 MED ORDER — ACETAMINOPHEN 500 MG PO TABS
1000.0000 mg | ORAL_TABLET | Freq: Three times a day (TID) | ORAL | Status: DC | PRN
  Administered 2013-05-05 – 2013-05-06 (×3): 1000 mg via ORAL
  Filled 2013-05-05 (×3): qty 2

## 2013-05-05 MED ORDER — HYDROXYCHLOROQUINE SULFATE 200 MG PO TABS
200.0000 mg | ORAL_TABLET | Freq: Two times a day (BID) | ORAL | Status: DC
  Administered 2013-05-05 – 2013-05-06 (×3): 200 mg via ORAL
  Filled 2013-05-05 (×5): qty 1

## 2013-05-05 NOTE — ED Notes (Signed)
Attempted to call report to floor.  RN with another pt, will return my call when available.

## 2013-05-05 NOTE — Evaluation (Signed)
Physical Therapy Evaluation Patient Details Name: Jenna [name] MRN: [id number] DOB: 10/26/1955 Today's Date: 05/05/2013 Time: 1209-1230 PT Time Calculation (min): 21 min  PT Assessment / Plan / Recommendation History of Present Illness  58 y.o. female admitted to [facility] on 05/04/13 with left sided numbness and tingling.  She was admitted for stroke workup.  CT and MRI negative for acute stroke.  Pt with significant PMHx of lupus.    Clinical Impression  Pt with painful, antalgic gait pattern. She states that when she came into the hospital she was taken off of her lupus meds and has not started them again. Now she is starting to have joint pain and it is affecting her gait stability.  She does not have any more leg weakness.  She does have some mild sensation changes on her left side that are improving.  PT will continue to follow acutely , but if her pain resolves she will not need any therapy f/u at d/c.      PT Assessment  Patient needs continued PT services    Follow Up Recommendations  No PT follow up;Other (comment) (if pain is better)    Does the patient have the potential to tolerate intense rehabilitation     NA  Barriers to Discharge Decreased caregiver support after the weekend her daughter will be going home.     Equipment Recommendations  None recommended by PT (pt may use her home cane)    Recommendations for Other Services   None  Frequency Min 3X/week    Precautions / Restrictions Precautions Precautions: Fall Precaution Comments: pt reports she has not had her lupus meds and it makes her joints hurt, antalgic gait pattern Restrictions Weight Bearing Restrictions: No   Pertinent Vitals/Pain See vitals flow sheet.       Mobility  Bed Mobility Overal bed mobility: Modified Independent General bed mobility comments: HOB elevated, used railing to pull to sit.  Transfers Overall transfer level: Needs assistance Equipment used: 1 person hand held  assist Transfers: Sit to/from Stand Sit to Stand: Modified independent (Device/Increase time) General transfer comment: min hand held assist due to joint pain and resultant instability Ambulation/Gait Ambulation/Gait assistance: Min assist Ambulation Distance (Feet): 80 Feet Assistive device: 1 person hand held assist (and hallway railing) Gait Pattern/deviations: Step-through pattern;Antalgic Gait velocity: decreasead Gait velocity interpretation: Below normal speed for age/gender General Gait Details: pt with painful antalgic gait pattern.  She is reaching for bil upper extremity support during gait.          PT Diagnosis: Difficulty walking;Abnormality of gait;Generalized weakness;Acute pain  PT Problem List: Decreased activity tolerance;Decreased balance;Decreased mobility;Pain;Obesity PT Treatment Interventions: DME instruction;Gait training;Stair training;Functional mobility training;Therapeutic activities;Therapeutic exercise;Balance training;Neuromuscular re-education;Patient/family education;Modalities     PT Goals(Current goals can be found in the care plan section) Acute Rehab PT Goals Patient Stated Goal: return home PT Goal Formulation: With patient Time For Goal Achievement: 05/19/13 Potential to Achieve Goals: Good  Visit Information  Last PT Received On: 05/05/13 Assistance Needed: +1 History of Present Illness: 58 y.o. female admitted to [facility] on 05/04/13 with left sided numbness and tingling.  She was admitted for stroke workup.  CT and MRI negative for acute stroke.  Pt with significant PMHx of lupus.         Prior [place] expects to be discharged to:: Private residence Living Arrangements: Alone Available Help at Discharge: Friend(s);Available PRN/intermittently Type of Home: Apartment Home Access: Stairs to enter [employer] of Steps:  16 Entrance Stairs-Rails: Right;Left Home Layout: One level Home Equipment:  Grab bars - tub/shower Additional Comments: Daughter reports she will likely stay until monday with her mom before returning to [place].  Prior Function Level of Independence: Independent Comments: Pt reports her lupus never flairs because she takes her meds regularly, so she has never had to use a walker for a short time due to her pain.   Communication Communication: No difficulties Dominant Hand: Right    Cognition  Cognition Arousal/Alertness: Awake/alert Behavior During Therapy: WFL for tasks assessed/performed Overall Cognitive Status: Within Functional Limits for tasks assessed    Extremity/Trunk Assessment Upper Extremity Assessment Upper Extremity Assessment: Overall WFL for tasks assessed Lower Extremity Assessment Lower Extremity Assessment: Defer to PT evaluation Cervical / Trunk Assessment Cervical / Trunk Assessment: Normal   Balance Balance Overall balance assessment: Needs assistance Sitting-balance support: Feet supported;No upper extremity supported Sitting balance-Leahy Scale: Good Standing balance support: Bilateral upper extremity supported Standing balance-Leahy Scale: Poor  End of Session PT - End of Session Activity Tolerance: Patient limited by pain Patient left: in bed;with call bell/phone within reach;with family/visitor present (seated EOB, daughter in room)  GP Functional Assessment Tool Used: assist level Functional Limitation: Mobility: Walking and moving around Mobility: Walking and Moving Around Current Status (T3646): At least 20 percent but less than 40 percent impaired, limited or restricted Mobility: Walking and Moving Around Goal Status [phone]): 0 percent impaired, limited or restricted   Jenna [name] B. Weed, [place], DPT [phone]   05/05/2013, 2:49 PM

## 2013-05-05 NOTE — ED Notes (Signed)
No change in pt condition.  She is feeling normal and thought she would go home.  Explained concern is for TIA.

## 2013-05-05 NOTE — Consult Note (Signed)
Referring Physician: Dr. Posey Pronto    Chief Complaint: Transient left facial numbness.  HPI: Jenna [name] is an 58 y.o. female with a history of systemic lupus and obesity who came to the emergency room following an episode of numbness involving left side of her face. She describes it as being given local anesthetic dentist. Symptoms lasted for several hours then resolved. Patient had a similar spell about one week ago. CT scan of her head showed no acute intracranial abnormality. Patient has not been on antiplatelet therapy. NIH stroke score was 1 for residual numbness.  LSN: 10 AM on 05/04/2013 tPA Given: No: Deficits resolved MRankin: 0  Past Medical History  Diagnosis Date  . Lupus (systemic lupus erythematosus)   . Obesity   . Renal stone     Family History  Problem Relation Age of Onset  . Diabetes Mother   . Hypertension Father   . Cancer Sister   . Hypertension Brother      Medications: I have reviewed the patient's current medications.  ROS: History obtained from the patient  General ROS: negative for - chills, fatigue, fever, night sweats, weight gain or weight loss Psychological ROS: negative for - behavioral disorder, hallucinations, memory difficulties, mood swings or suicidal ideation Ophthalmic ROS: negative for - blurry vision, double vision, eye pain or loss of vision ENT ROS: negative for - epistaxis, nasal discharge, oral lesions, sore throat, tinnitus or vertigo Allergy and Immunology ROS: negative for - hives or itchy/watery eyes Hematological and Lymphatic ROS: negative for - bleeding problems, bruising or swollen lymph nodes Endocrine ROS: negative for - galactorrhea, hair pattern changes, polydipsia/polyuria or temperature intolerance Respiratory ROS: negative for - cough, hemoptysis, shortness of breath or wheezing Cardiovascular ROS: negative for - chest pain, dyspnea on exertion, edema or irregular heartbeat Gastrointestinal ROS: negative for -  abdominal pain, diarrhea, hematemesis, nausea/vomiting or stool incontinence Genito-Urinary ROS: negative for - dysuria, hematuria, incontinence or urinary frequency/urgency Musculoskeletal ROS: Positive for pain and tenderness involving left knee Neurological ROS: as noted in HPI Dermatological ROS: negative for rash and skin lesion changes  Physical Examination: Blood pressure 149/96, pulse 81, temperature 98.1 F (36.7 C), temperature source Oral, resp. rate 20, height $RemoveBe'5\' 7"'[id number]$  (1.702 m), weight 93.6 kg (206 lb 5.6 oz), SpO2 99.00%.  Neurologic Examination: Mental Status: Alert, oriented, thought content appropriate.  Speech fluent without evidence of aphasia. Able to follow commands without difficulty. Cranial Nerves: II-Visual fields were normal. III/IV/VI-Pupils were equal and reacted. Extraocular movements were full and conjugate.    V/VII-mild left facial numbness; no facial weakness. VIII-normal. X-normal speech and symmetrical palatal movement. XII-midline tongue extension Motor: 5/5 bilaterally with normal tone and bulk Sensory: Reduced perception of tactile sensation over left upper extremity compared to right upper extremity. Deep Tendon Reflexes: 2+ and symmetric. Plantars: Mute bilaterally Cerebellar: Normal finger-to-nose testing. Carotid auscultation: Normal  Ct Head Wo Contrast  05/04/2013   CLINICAL DATA:  Severe headache. Left-sided face and arm numbness. Hypertension. Systemic lupus erythematosus.  EXAM: CT HEAD WITHOUT CONTRAST  TECHNIQUE: Contiguous axial images were obtained from the base of the skull through the vertex without intravenous contrast.  COMPARISON:  None.  FINDINGS: No evidence of intracranial hemorrhage, brain edema, or other signs of acute infarction. No evidence of intracranial mass lesion or mass effect. No abnormal extraaxial fluid collections identified. Ventricles are normal in size. No skull abnormality identified.  IMPRESSION: Negative  noncontrast head CT.   Electronically Signed   By: Sharrie Rothman.D.  On: 05/04/2013 22:02    Assessment: 58 y.o. female presenting with numbness involving left side of her face and to a lesser extent left upper extremity. TIA possible small vessel right subcortical infarction cannot be ruled out.  Stroke Risk Factors - family history  Plan: 1. HgbA1c, fasting lipid panel 2. MRI, MRA  of the brain without contrast 3. PT consult, OT consult, Speech consult 4. Echocardiogram 5. Carotid dopplers 6. Prophylactic therapy-Antiplatelet med: Aspirin  7. Risk factor modification 8. Telemetry monitoring   C.R. Nicole Kindred, MD [facility] [phone]  05/05/2013, 3:00 AM

## 2013-05-05 NOTE — Progress Notes (Signed)
Neuro:  Pt is back to baseline. MRI brain no acute abnormality.  Pt needs to be on anti platelet either ASA or plavix. Recently had all her work up would not repeat it Obtain vascular studies and likely can be d/c today D/w daughter at bedside. Leotis Pain

## 2013-05-05 NOTE — Evaluation (Signed)
Occupational Therapy Evaluation Patient Details Name: Jenna [name] MRN: [id number] DOB: [birth year] Today's Date: 05/05/2013 Time: 1410-1446 OT Time Calculation (min): 36 min  OT Assessment / Plan / Recommendation History of present illness 58 y.o. female admitted to [facility] on 05/04/13 with left sided numbness and tingling.  She was admitted for stroke workup.  CT and MRI negative for acute stroke.  Pt with significant PMHx of lupus.     Clinical Impression   Pt presents with some unsteadiness when ambulating due to L knee pain as a result of her lupus.  She has been taking herself to the bathroom and is able to perform self care at the sink. Instructed pt in energy conservation.  L side tingling is resolving.      OT Assessment  Patient does not need any further OT services    Follow Up Recommendations  No OT follow up;Supervision - Intermittent    Barriers to Discharge      Equipment Recommendations  None recommended by OT    Recommendations for Other Services    Frequency       Precautions / Restrictions Precautions Precautions: Fall Precaution Comments: pt reports she has not had her lupus meds and it makes her joints hurt, antalgic gait pattern Restrictions Weight Bearing Restrictions: No   Pertinent Vitals/Pain L knee, did not rate, VSS    ADL  Eating/Feeding: Independent Where Assessed - Eating/Feeding: Edge of bed Grooming: Wash/dry hands;Independent Where Assessed - Grooming: Unsupported standing Upper Body Bathing: Independent Where Assessed - Upper Body Bathing: Unsupported standing Lower Body Bathing: Modified independent Where Assessed - Lower Body Bathing: Supported standing Upper Body Dressing: Independent Where Assessed - Upper Body Dressing: Unsupported sitting Lower Body Dressing: Independent Where Assessed - Lower Body Dressing: Unsupported sitting;Unsupported standing Toilet Transfer: Modified independent Toilet Transfer Method: Sit to Teacher, music: Regular height toilet Toileting - Clothing Manipulation and Hygiene: Independent Where Assessed - Toileting Clothing Manipulation and Hygiene: Sit to stand from 3-in-1 or toilet Transfers/Ambulation Related to ADLs: ambulated to bathroom with modified independence, slowly due to L knee pain ADL Comments: Able to cross foot over opposite knee to access feet.    OT Diagnosis:    OT Problem List:   OT Treatment Interventions:     OT Goals(Current goals can be found in the care plan section) Acute Rehab OT Goals Patient Stated Goal: return home  Visit Information  Last OT Received On: 05/05/13 Assistance Needed: +1 History of Present Illness: 58 y.o. female admitted to [facility] on 05/04/13 with left sided numbness and tingling.  She was admitted for stroke workup.  CT and MRI negative for acute stroke.  Pt with significant PMHx of lupus.         Prior Functioning     Home Living Family/patient expects to be discharged to:: Private residence Living Arrangements: Alone Available Help at Discharge: Friend(s);Available PRN/intermittently Type of Home: Apartment Home Access: Stairs to enter Entrance Stairs-Number of Steps: 16 Entrance Stairs-Rails: Right;Left Home Layout: One level Home Equipment: Grab bars - tub/shower Additional Comments: Daughter reports she will likely stay until monday with her mom before returning to [place].  Prior Function Level of Independence: Needs assistance ADL's / Homemaking Assistance Needed: daughter does housework, neighbors help with carrying groceries up stairs.   Comments: Pt reports her lupus never flairs because she takes her meds regularly, so she has never had to use a walker for a short time due to her pain.   Communication Communication:  No difficulties Dominant Hand: Right         Vision/Perception Vision - History Patient Visual Report: No change from baseline   Cognition  Cognition Arousal/Alertness:  Awake/alert Behavior During Therapy: WFL for tasks assessed/performed Overall Cognitive Status: Within Functional Limits for tasks assessed    Extremity/Trunk Assessment Upper Extremity Assessment Upper Extremity Assessment: Overall WFL for tasks assessed Lower Extremity Assessment Lower Extremity Assessment: Defer to PT evaluation Cervical / Trunk Assessment Cervical / Trunk Assessment: Normal     Mobility Bed Mobility Overal bed mobility: Modified Independent General bed mobility comments: HOB elevated, used railing to pull to sit.  Transfers Transfers: Sit to/from Stand Sit to Stand: Modified independent (Device/Increase time)      Exercise     Balance    End of Session OT - End of Session Activity Tolerance: Patient limited by pain Patient left: in bed;with call bell/phone within reach;with family/visitor present  GO Functional Assessment Tool Used: clinical judgement Functional Limitation: Self care Self Care Current Status (D5520): At least 1 percent but less than 20 percent impaired, limited or restricted Self Care Goal Status (E0223): At least 1 percent but less than 20 percent impaired, limited or restricted Self Care Discharge Status [phone] At least 1 percent but less than 20 percent impaired, limited or restricted   [name] 05/05/2013, 2:52 PM [phone]

## 2013-05-05 NOTE — Progress Notes (Signed)
Patient Demographics  Jenna [name], is a 58 y.o. female, DOB - 03-Apr-1955, [id number]  Admit date - 05/04/2013   Admitting Physician Berle Mull, MD  Outpatient Primary MD for the patient is No primary provider on file.  LOS - 1   Chief Complaint  Patient presents with  . Numbness        Assessment & Plan    1. Left-sided weakness and numbness transient. TIA versus CVA versus lupus vasculitis - no focal deficits on exam, some subjective weakness persists, head CT stable, complete CVA workup with MRI MRA brain, echogram and carotid duplex. She is on aspirin which will be continued. Neuro following. Will await further input. Lipid panel stable A1c is pending. She will be seen by PT OT and speech.     2. History of lupus. On Plaquenil which will be continued.     3. UA. Sample not clean, no clinical signs of UTI. We'll monitor clinically.       Code Status: full  Family Communication: Daughter  Disposition Plan: Home   Procedures CT head, MRI/MRA brain, echo gram, carotid duplex   Consults  Neuro   Medications  Scheduled Meds: . aspirin  325 mg Oral Daily  . heparin  5,000 Units Subcutaneous 3 times per day  . hydroxychloroquine  200 mg Oral BID   Continuous Infusions:  PRN Meds:.acetaminophen, HYDROcodone-acetaminophen  DVT Prophylaxis  Heparin   Lab Results  Component Value Date   PLT 150 05/04/2013    Antibiotics   Anti-infectives   Start     Dose/Rate Route Frequency Ordered Stop   05/05/13 1000  hydroxychloroquine (PLAQUENIL) tablet 200 mg     200 mg Oral 2 times daily 05/05/13 0102            Subjective:   [name] today has, No headache, No chest pain, No abdominal pain - No Nausea, No new weakness tingling or numbness, No Cough -  SOB.    Objective:   Filed Vitals:   05/05/13 0150 05/05/13 0202 05/05/13 0600 05/05/13 0815  BP: 149/96 168/97 152/78 137/98  Pulse: 81 80 79 91  Temp: 98.1 F (36.7 C)  97.8 F (36.6 C) 98 F (36.7 C)  TempSrc: Oral  Oral Oral  Resp: $Remo'20 18 18 18  'dBuJs$ Height: $Rem'5\' 7"'zmfi$  (1.702 m)     Weight: 93.6 kg (206 lb 5.6 oz)     SpO2: 99% 99% 100% 100%    Wt Readings from Last 3 Encounters:  05/05/13 93.6 kg (206 lb 5.6 oz)  04/28/11 91.445 kg (201 lb 9.6 oz)  04/15/11 89.903 kg (198 lb 3.2 oz)    No intake or output data in the 24 hours ending 05/05/13 1001   Physical Exam  Awake Alert, Oriented X 3, No new F.N deficits, Normal affect [place].AT,PERRAL Supple Neck,No JVD, No cervical lymphadenopathy appriciated.  Symmetrical Chest wall movement, Good air movement bilaterally, CTAB RRR,No Gallops,Rubs or new Murmurs, No Parasternal Heave +ve B.Sounds, Abd Soft, Non tender, No organomegaly appriciated, No rebound - guarding or rigidity. No Cyanosis, Clubbing or edema, No new Rash or bruise    Data Review   Micro Results No results found for this or any previous visit (from the past 240 hour(s)).  Radiology Reports Ct Head Wo Contrast  05/04/2013   CLINICAL DATA:  Severe headache. Left-sided face and arm numbness. Hypertension. Systemic lupus erythematosus.  EXAM: CT HEAD WITHOUT CONTRAST  TECHNIQUE: Contiguous axial images were obtained from the base of the skull through the vertex without intravenous contrast.  COMPARISON:  None.  FINDINGS: No evidence of intracranial hemorrhage, brain edema, or other signs of acute infarction. No evidence of intracranial mass lesion or mass effect. No abnormal extraaxial fluid collections identified. Ventricles are normal in size. No skull abnormality identified.  IMPRESSION: Negative noncontrast head CT.   Electronically Signed   By: Earle Gell M.D.   On: 05/04/2013 22:02    CBC  Recent Labs Lab 05/04/13 2117  WBC 3.6*  HGB 10.0*  HCT 30.8*    PLT 150  MCV 84.4  MCH 27.4  MCHC 32.5  RDW 14.9  LYMPHSABS 0.9  MONOABS 0.1  EOSABS 0.1  BASOSABS 0.0    Chemistries   Recent Labs Lab 05/04/13 2117  NA 138  K 4.4  CL 101  CO2 24  GLUCOSE 99  BUN 32*  CREATININE 2.21*  CALCIUM 8.8   ------------------------------------------------------------------------------------------------------------------ estimated creatinine clearance is 33 ml/min (by C-G formula based on Cr of 2.21). ------------------------------------------------------------------------------------------------------------------ No results found for this basename: HGBA1C,  in the last 72 hours ------------------------------------------------------------------------------------------------------------------  Recent Labs  05/05/13 0414  CHOL 121  HDL 30*  LDLCALC 70  TRIG 105  CHOLHDL 4.0   ------------------------------------------------------------------------------------------------------------------ No results found for this basename: TSH, T4TOTAL, FREET3, T3FREE, THYROIDAB,  in the last 72 hours ------------------------------------------------------------------------------------------------------------------ No results found for this basename: VITAMINB12, FOLATE, FERRITIN, TIBC, IRON, RETICCTPCT,  in the last 72 hours  Coagulation profile No results found for this basename: INR, PROTIME,  in the last 168 hours  No results found for this basename: DDIMER,  in the last 72 hours  Cardiac Enzymes No results found for this basename: CK, CKMB, TROPONINI, MYOGLOBIN,  in the last 168 hours ------------------------------------------------------------------------------------------------------------------ No components found with this basename: POCBNP,      Time Spent in minutes  35   SINGH,PRASHANT K M.D on 05/05/2013 at 10:01 AM  Between 7am to 7pm - Pager - [phone]  After 7pm go to [email or web address] - password [email or web address]  And look for the night  coverage person covering for me after hours  [facility] Office  [phone]

## 2013-05-05 NOTE — ED Notes (Signed)
Pt remains alert, talkative, no distress noted.  Denies left sided N/T.  To go to floor with EMT with monitor.

## 2013-05-05 NOTE — Progress Notes (Signed)
Utilization Review Completed.Donne Anon T3/21/2015

## 2013-05-05 NOTE — Evaluation (Signed)
Clinical/Bedside Swallow Evaluation Patient Details  Name: Jenna [name] MRN: [id number] Date of Birth: May 14, 1955  Today's Date: 05/05/2013 Time: 1158-1208 SLP Time Calculation (min): 10 min  Past Medical History:  Past Medical History  Diagnosis Date  . Lupus (systemic lupus erythematosus)   . Obesity   . Renal stone    Past Surgical History: History reviewed. No pertinent past surgical history. HPI:  58 y.o. female admitted with left-sided weakness and numbness transient. TIA versus CVA versus lupus vasculitis.  CCT and MRI negative for acute intracranial abnormality.      Assessment / Plan / Recommendation Clinical Impression  Pt presents with normal oropharyngeal swallow with active mastication, swift swallow trigger, and no s/s of airway compromise associated with eating.   She does report globus sensation and was observed to belch frequently after eating.  Encouraged her to let MD know if symptoms do not resolve.  No SLP f/u is needed.  Recommend continuing regular diet with thin liquids.             Diet Recommendation Regular;Thin liquid   Liquid Administration via: Cup;Straw Medication Administration: Whole meds with liquid Supervision: Patient able to self feed    Other  Recommendations Oral Care Recommendations: Oral care BID   Follow Up Recommendations  None      Swallow Study Prior Functional Status       General Date of Onset: 05/05/13 HPI: 58 y.o. female admitted with left-sided weakness and numbness transient. TIA versus CVA versus lupus vasculitis.  CCT and MRI negative for acute intracranial abnormality.    Type of Study: Bedside swallow evaluation Previous Swallow Assessment: none per records Diet Prior to this Study: Regular;Thin liquids Temperature Spikes Noted: No Respiratory Status: Room air History of Recent Intubation: No Behavior/Cognition: Alert;Cooperative;Pleasant mood Oral Cavity - Dentition: Adequate natural dentition;Missing  dentition Self-Feeding Abilities: Able to feed self Patient Positioning: Upright in bed Baseline Vocal Quality: Clear Volitional Cough: Strong Volitional Swallow: Able to elicit    Oral/Motor/Sensory Function Overall Oral Motor/Sensory Function: Appears within functional limits for tasks assessed   Ice Chips Ice chips: Within functional limits   Thin Liquid Thin Liquid: Within functional limits Presentation: Cup;Straw;Self Fed    Nectar Thick Nectar Thick Liquid: Not tested   Honey Thick Honey Thick Liquid: Not tested   Puree Puree: Within functional limits Presentation: Self Fed;Spoon   Solid   GO Functional Assessment Tool Used: clinical judgement Functional Limitations: Swallowing Swallow Current Status (Y5638): 0 percent impaired, limited or restricted Swallow Goal Status (L3734): 0 percent impaired, limited or restricted Swallow Discharge Status [phone]): 0 percent impaired, limited or restricted  Solid: Within functional limits Presentation: Self Fed       Juan Quam Laurice 05/05/2013,12:18 PM

## 2013-05-06 DIAGNOSIS — I519 Heart disease, unspecified: Secondary | ICD-10-CM

## 2013-05-06 DIAGNOSIS — G459 Transient cerebral ischemic attack, unspecified: Secondary | ICD-10-CM

## 2013-05-06 MED ORDER — ASPIRIN EC 81 MG PO TBEC: 81.0000 mg | DELAYED_RELEASE_TABLET | Freq: Every day | ORAL | Status: DC

## 2013-05-06 NOTE — Progress Notes (Signed)
*  PRELIMINARY RESULTS* Vascular Ultrasound Carotid Duplex (Doppler) has been completed.  Preliminary findings: Bilateral:  1-39% ICA stenosis.  Vertebral artery flow is antegrade.      Landry Mellow, RDMS, RVT  05/06/2013, 11:38 AM

## 2013-05-06 NOTE — ED Provider Notes (Signed)
Medical screening examination/treatment/procedure(s) were conducted as a shared visit with resident-physician practitioner(s) and myself.  I personally evaluated the patient during the encounter.  Pt is a 58 y.o. female with pmhx as above presenting with episode of L facila numbness, now resolved.  Pt found to have no acute CT head findings.  No by exam, no focal neuro findings.  Pt admitted to TIA w/u.Marland Kitchen    EKG Interpretation  Date/Time:  Friday May 04 2013 21:15:40 EDT Ventricular Rate:  76 PR Interval:  158 QRS Duration: 120 QT Interval:  414 QTC Calculation: 465 R Axis:   -38 Text Interpretation:  Sinus rhythm Nonspecific IVCD with LAD Left ventricular hypertrophy Anterior Q waves, possibly due to LVH Borderline T abnormalities, inferior leads Confirmed by DOCHERTY  MD, MEGAN ([id number]) on 05/04/2013 10:20:13 PM        Neta Ehlers, MD 05/06/13 1158

## 2013-05-06 NOTE — Progress Notes (Signed)
Physical Therapy Treatment Patient Details Name: Jenna [name] MRN: [id number] DOB: 1955/11/23 Today's Date: 05/06/2013 Time: 4268-3419 PT Time Calculation (min): 23 min  PT Assessment / Plan / Recommendation  History of Present Illness 58 y.o. female admitted to [facility] on 05/04/13 with left sided numbness and tingling.  She was admitted for stroke workup.  CT and MRI negative for acute stroke.  Pt with significant PMHx of lupus.     PT Comments   Pt is mobilizing much better today.  Pain less in bil legs and left arm.  Pt able to walk without assistive device safely and demonstrated safety on the stairs.  She has no further acute or f/u PT needs at this time. PT to sign off.   Follow Up Recommendations  No PT follow up     Does the patient have the potential to tolerate intense rehabilitation    NA  Barriers to Discharge   None      Equipment Recommendations  None recommended by PT    Recommendations for Other Services   None  Frequency Min 3X/week   Progress towards PT Goals Progress towards PT goals: Goals met/education completed, patient discharged from PT  Plan Current plan remains appropriate    Precautions / Restrictions Precautions Precautions: Fall Precaution Comments: lingering left [name] pain with mobility   Pertinent Vitals/Pain See vitals flow sheet.    Mobility  Bed Mobility Overal bed mobility: Modified Independent General bed mobility comments: HOB elevated and used the railing.  Transfers Overall transfer level: Modified independent General transfer comment: Using arms for transitions.   Ambulation/Gait Ambulation/Gait assistance: Modified independent (Device/Increase time) Ambulation Distance (Feet): 250 Feet Assistive device: None Gait Pattern/deviations: Step-through pattern;Antalgic Gait velocity: decreased Gait velocity interpretation: Below normal speed for age/gender General Gait Details: gait speed and pattern better today than yesterday.  Pt was  able to walk without an assistive device and significantly further than yesterday.  She still has a very mild antalgic gait pattern, but compensates well.  Stairs: Yes Stairs assistance: Modified independent (Device/Increase time) Stair Management: One rail Right;Step to pattern;Forwards Number of Stairs: 9 General stair comments: Reviewed safest stair management technique including step to gait pattern leading up the stairs with her right leg and down with her left leg.  Extra time needed, but pt demonstrated safety.  Daughter present for stair training and gait education.      PT Goals (current goals can now be found in the care plan section) Acute Rehab PT Goals Patient Stated Goal: return home  Visit Information  Last PT Received On: 05/06/13 Assistance Needed: +1 History of Present Illness: 58 y.o. female admitted to [facility] on 05/04/13 with left sided numbness and tingling.  She was admitted for stroke workup.  CT and MRI negative for acute stroke.  Pt with significant PMHx of lupus.      Subjective Data  Subjective: Pt reports that she is feeling better today and that she might go home.  Patient Stated Goal: return home   Cognition  Cognition Arousal/Alertness: Awake/alert Behavior During Therapy: WFL for tasks assessed/performed Overall Cognitive Status: Within Functional Limits for tasks assessed    Balance  Balance Overall balance assessment: Needs assistance Sitting-balance support: No upper extremity supported;Feet supported Sitting balance-Leahy Scale: Normal Sitting balance - Comments: pt able to donn socks sitting EOB using figur four technique and no upper extremity support.  No LOB noted.  Standing balance support: No upper extremity supported Standing balance-Leahy Scale: Good Standing balance comment:  limitations due to painful left [name]  End of Session PT - End of Session Activity Tolerance: Patient limited by pain Patient left: in chair;with call bell/phone  within reach;with family/visitor present Nurse Communication: Mobility status   GP Functional Assessment Tool Used: assist level Functional Limitation: Mobility: Walking and moving around Mobility: Walking and Moving Around Goal Status [phone]): 0 percent impaired, limited or restricted Mobility: Walking and Moving Around Discharge Status [phone]): 0 percent impaired, limited or restricted   Ashelynn Marks B. Petros, [place], DPT [phone]   05/06/2013, 11:20 AM

## 2013-05-06 NOTE — Discharge Instructions (Signed)
Follow with Primary MD  in 7 days   Get CBC, CMP, checked 7 days by Primary MD and again as instructed by your Primary MD. Get a 2 view Chest X ray done next visit if you had Pneumonia of Lung problems at the Hospital.   Activity: As tolerated with Full fall precautions use walker/cane & assistance as needed   Disposition Home     Diet: Heart Healthy   For Heart failure patients - Check your Weight same time everyday, if you gain over 2 pounds, or you develop in leg swelling, experience more shortness of breath or chest pain, call your Primary MD immediately. Follow Cardiac Low Salt Diet and 1.8 lit/day fluid restriction.   On your next visit with her primary care physician please Get Medicines reviewed and adjusted.  Please request your Prim.MD to go over all Hospital Tests and Procedure/Radiological results at the follow up, please get all Hospital records sent to your Prim MD by signing hospital release before you go home.   If you experience worsening of your admission symptoms, develop shortness of breath, life threatening emergency, suicidal or homicidal thoughts you must seek medical attention immediately by calling 911 or calling your MD immediately  if symptoms less severe.  You Must read complete instructions/literature along with all the possible adverse reactions/side effects for all the Medicines you take and that have been prescribed to you. Take any new Medicines after you have completely understood and accpet all the possible adverse reactions/side effects.   Do not drive and provide baby sitting services if your were admitted for syncope or siezures until you have seen by Primary MD or a Neurologist and advised to do so again.  Do not drive when taking Pain medications.    Do not take more than prescribed Pain, Sleep and Anxiety Medications  Special Instructions: If you have smoked or chewed Tobacco  in the last 2 yrs please stop smoking, stop any regular Alcohol  and  or any Recreational drug use.  Wear Seat belts while driving.   Please note  You were cared for by a hospitalist during your hospital stay. If you have any questions about your discharge medications or the care you received while you were in the hospital after you are discharged, you can call the unit and asked to speak with the hospitalist on call if the hospitalist that took care of you is not available. Once you are discharged, your primary care physician will handle any further medical issues. Please note that NO REFILLS for any discharge medications will be authorized once you are discharged, as it is imperative that you return to your primary care physician (or establish a relationship with a primary care physician if you do not have one) for your aftercare needs so that they can reassess your need for medications and monitor your lab values.

## 2013-05-06 NOTE — Discharge Summary (Signed)
Jenna [name], is a 58 y.o. female  DOB 1955-05-26  MRN [id number].  Admission date:  05/04/2013  Admitting Physician  Berle Mull, MD  Discharge Date:  05/06/2013   Primary MD  Thressa Sheller, MD  Recommendations for primary care physician for things to follow:   Monitor clinically   Admission Diagnosis  TIA (transient ischemic attack) [435.9]   Discharge Diagnosis  TIA (transient ischemic attack) [435.9]     Principal Problem:   TIA (transient ischemic attack) Active Problems:   Lupus   Numbness and tingling of left side of face      Past Medical History  Diagnosis Date  . Lupus (systemic lupus erythematosus)   . Obesity   . Renal stone     History reviewed. No pertinent past surgical history.   Discharge Condition: stable   Follow UP  Follow-up Information   Follow up with Thressa Sheller, MD. Schedule an appointment as soon as possible for a visit in 1 week.   Specialty:  Internal Medicine   Contact information:   [address], [place] [address] [phone]         Discharge Instructions  and  Discharge Medications          Discharge Orders   Future Orders Complete By Expires   Diet - low sodium heart healthy  As directed    Discharge instructions  As directed    Comments:     Follow with Primary MD Thressa Sheller, MD in 7 days   Get CBC, CMP, checked 7 days by Primary MD and again as instructed by your Primary MD. Get a 2 view Chest X ray done next visit if you had Pneumonia of Lung problems at the Hospital.   Activity: As tolerated with Full fall precautions use walker/cane & assistance as needed   Disposition Home    Diet: Heart Healthy    For Heart failure patients - Check your Weight same time everyday, if you gain over 2 pounds, or you develop in leg  swelling, experience more shortness of breath or chest pain, call your Primary MD immediately. Follow Cardiac Low Salt Diet and 1.8 lit/day fluid restriction.   On your next visit with her primary care physician please Get Medicines reviewed and adjusted.  Please request your Prim.MD to go over all Hospital Tests and Procedure/Radiological results at the follow up, please get all Hospital records sent to your Prim MD by signing hospital release before you go home.   If you experience worsening of your admission symptoms, develop shortness of breath, life threatening emergency, suicidal or homicidal thoughts you must seek medical attention immediately by calling 911 or calling your MD immediately  if symptoms less severe.  You Must read complete instructions/literature along with all the possible adverse reactions/side effects for all the Medicines you take and that have been prescribed to you. Take any new Medicines after you have completely understood and accpet all the possible adverse reactions/side effects.   Do not drive and provide baby sitting  services if your were admitted for syncope or siezures until you have seen by Primary MD or a Neurologist and advised to do so again.  Do not drive when taking Pain medications.    Do not take more than prescribed Pain, Sleep and Anxiety Medications  Special Instructions: If you have smoked or chewed Tobacco  in the last 2 yrs please stop smoking, stop any regular Alcohol  and or any Recreational drug use.  Wear Seat belts while driving.   Please note  You were cared for by a hospitalist during your hospital stay. If you have any questions about your discharge medications or the care you received while you were in the hospital after you are discharged, you can call the unit and asked to speak with the hospitalist on call if the hospitalist that took care of you is not available. Once you are discharged, your primary care physician will handle  any further medical issues. Please note that NO REFILLS for any discharge medications will be authorized once you are discharged, as it is imperative that you return to your primary care physician (or establish a relationship with a primary care physician if you do not have one) for your aftercare needs so that they can reassess your need for medications and monitor your lab values.   Increase activity slowly  As directed        Medication List         acetaminophen 500 MG tablet  Commonly known as:  TYLENOL  Take 1,000 mg by mouth 3 (three) times daily as needed (pain).     aspirin EC 81 MG tablet  Take 1 tablet (81 mg total) by mouth daily.     HYDROcodone-acetaminophen 7.5-325 MG per tablet  Commonly known as:  NORCO  Take 1 tablet by mouth 2 (two) times daily as needed (pain).     hydroxychloroquine 200 MG tablet  Commonly known as:  PLAQUENIL  Take 200 mg by mouth 2 (two) times daily.          Diet and Activity recommendation: See Discharge Instructions above   Consults obtained - Neuro   Major procedures and Radiology Reports - PLEASE review detailed and final reports for all details, in brief -   Carotids    Carotid Duplex (Doppler) has been completed. Preliminary findings: Bilateral: 1-39% ICA stenosis. Vertebral artery flow is antegrade    Echo   Left ventricle: The cavity size was normal. Systolic function was normal. The estimated ejection fraction was in the range of 50% to 55%. Wall motion was normal; there were no regional wall motion abnormalities.    Ct Head Wo Contrast  05/04/2013   CLINICAL DATA:  Severe headache. Left-sided face and arm numbness. Hypertension. Systemic lupus erythematosus.  EXAM: CT HEAD WITHOUT CONTRAST  TECHNIQUE: Contiguous axial images were obtained from the base of the skull through the vertex without intravenous contrast.  COMPARISON:  None.  FINDINGS: No evidence of intracranial hemorrhage, brain edema, or other signs of  acute infarction. No evidence of intracranial mass lesion or mass effect. No abnormal extraaxial fluid collections identified. Ventricles are normal in size. No skull abnormality identified.  IMPRESSION: Negative noncontrast head CT.   Electronically Signed   By: Earle Gell M.D.   On: 05/04/2013 22:02   Mri Brain Without Contrast  05/05/2013   CLINICAL DATA:  Left-sided numbness come a intermittent.  EXAM: MRI HEAD WITHOUT CONTRAST  MRA HEAD WITHOUT CONTRAST  TECHNIQUE: Multiplanar, multiecho pulse sequences  of the brain and surrounding structures were obtained without intravenous contrast. Angiographic images of the head were obtained using MRA technique without contrast.  COMPARISON:  Head CT 05/04/2012  FINDINGS: MRI HEAD FINDINGS  Diffusion imaging does not show any acute or subacute infarction. The brainstem and cerebellum are normal. The cerebral hemispheres show a few small foci of T2 and FLAIR signal in the white matter, commonly seen in healthy individuals of this age. No evidence of cortical or large vessel territory insult. No mass lesion, hemorrhage, hydrocephalus or extra-axial collection. There is arachnoid herniation into the sella. No pituitary mass. Sinuses are clear. Major vessels at the base of the brain show flow. No skull or skullbase lesion.  MRA HEAD FINDINGS  Both internal carotid arteries are widely patent into the brain. The anterior and middle cerebral vessels are normal without proximal stenosis, aneurysm or vascular malformation. Both vertebral arteries are widely patent to the basilar. No basilar stenosis. Posterior circulation branch vessels appear normal.  IMPRESSION: Normal intracranial MR angiography.  No significant brain parenchymal finding. Few small hemispheric white matter foci consistent with minimal small vessel change, frequently seen in healthy individuals of this age.   Electronically Signed   By: Nelson Chimes M.D.   On: 05/05/2013 10:30   Mr Jodene Nam Head/brain Wo  Cm  05/05/2013   CLINICAL DATA:  Left-sided numbness come a intermittent.  EXAM: MRI HEAD WITHOUT CONTRAST  MRA HEAD WITHOUT CONTRAST  TECHNIQUE: Multiplanar, multiecho pulse sequences of the brain and surrounding structures were obtained without intravenous contrast. Angiographic images of the head were obtained using MRA technique without contrast.  COMPARISON:  Head CT 05/04/2012  FINDINGS: MRI HEAD FINDINGS  Diffusion imaging does not show any acute or subacute infarction. The brainstem and cerebellum are normal. The cerebral hemispheres show a few small foci of T2 and FLAIR signal in the white matter, commonly seen in healthy individuals of this age. No evidence of cortical or large vessel territory insult. No mass lesion, hemorrhage, hydrocephalus or extra-axial collection. There is arachnoid herniation into the sella. No pituitary mass. Sinuses are clear. Major vessels at the base of the brain show flow. No skull or skullbase lesion.  MRA HEAD FINDINGS  Both internal carotid arteries are widely patent into the brain. The anterior and middle cerebral vessels are normal without proximal stenosis, aneurysm or vascular malformation. Both vertebral arteries are widely patent to the basilar. No basilar stenosis. Posterior circulation branch vessels appear normal.  IMPRESSION: Normal intracranial MR angiography.  No significant brain parenchymal finding. Few small hemispheric white matter foci consistent with minimal small vessel change, frequently seen in healthy individuals of this age.   Electronically Signed   By: Nelson Chimes M.D.   On: 05/05/2013 10:30    Micro Results      No results found for this or any previous visit (from the past 240 hour(s)).   History of present illness and  Hospital Course:     Kindly see H&P for history of present illness and admission details, please review complete Labs, Consult reports and Test reports for all details in brief Jenna [name], is a 58 y.o. female,  patient with history of  SLE, chronic kidney disease stage V baseline creatinine around 2.2, was admitted to the hospital with some left-sided tingling and numbness which later turned out to be left-sided arm pain, she was noted to be chronically on pain medications. Upon arrival there was question of TIA.   For left arm tingling  and numbness which appears more chronic than acute. She underwent CT scan of the head along with MRI MRA brain which were unremarkable, echogram and carotid results as above. Seen by neurology placed on aspirin, findings not consistent with acute ischemic event, if symptoms persist C-spine evaluation to rule out C-spine disc disease is recommended in the outpatient setting to PCP.   For her SLE and CKD 5 - continue home Meds.      Today   Subjective:   Stormie Ventola today has no headache,no chest abdominal pain,no new weakness tingling or numbness, feels much better wants to go home today.  Mild left  arm tingling.  Objective:   Blood pressure 144/94, pulse 80, temperature 98.2 F (36.8 C), temperature source Oral, resp. rate 18, height $RemoveBe'5\' 7"'[id number]$  (1.702 m), weight 93.6 kg (206 lb 5.6 oz), SpO2 100.00%.   Intake/Output Summary (Last 24 hours) at 05/06/13 1035 Last data filed at 05/06/13 0100  Gross per 24 hour  Intake    240 ml  Output    200 ml  Net     40 ml    Exam Awake Alert, Oriented *3, No new F.N deficits, Normal affect [place].AT,PERRAL Supple Neck,No JVD, No cervical lymphadenopathy appriciated.  Symmetrical Chest wall movement, Good air movement bilaterally, CTAB RRR,No Gallops,Rubs or new Murmurs, No Parasternal Heave +ve B.Sounds, Abd Soft, Non tender, No organomegaly appriciated, No rebound -guarding or rigidity. No Cyanosis, Clubbing or edema, No new Rash or bruise  Data Review   CBC w Diff: Lab Results  Component Value Date   WBC 2.7* 05/05/2013   WBC 5.0 04/28/2011   HGB 10.4* 05/05/2013   HGB 8.2* 04/28/2011   HCT 32.2* 05/05/2013   HCT  26.5* 04/28/2011   PLT 133* 05/05/2013   LYMPHOPCT 23 05/05/2013   MONOPCT 2* 05/05/2013   EOSPCT 3 05/05/2013   BASOPCT 0 05/05/2013    CMP: Lab Results  Component Value Date   NA 139 05/05/2013   K 4.8 05/05/2013   CL 104 05/05/2013   CO2 23 05/05/2013   BUN 30* 05/05/2013   CREATININE 2.16* 05/05/2013   CREATININE 1.52* 04/28/2011   PROT 8.1 05/05/2013   ALBUMIN 3.1* 05/05/2013   BILITOT 0.3 05/05/2013   ALKPHOS 67 05/05/2013   AST 23 05/05/2013   ALT 14 05/05/2013  .   Total Time in preparing paper work, data evaluation and todays exam - 35 minutes  Thurnell Lose M.D on 05/06/2013 at 10:35 AM  [place]  [phone]

## 2013-05-06 NOTE — Progress Notes (Signed)
  Echocardiogram 2D Echocardiogram has been performed.  Jenna [name] FRANCES 05/06/2013, 12:36 PM

## 2013-05-24 ENCOUNTER — Ambulatory Visit: Payer: No Typology Code available for payment source | Attending: Internal Medicine

## 2013-07-02 ENCOUNTER — Ambulatory Visit: Payer: No Typology Code available for payment source | Attending: Internal Medicine | Admitting: Internal Medicine

## 2013-07-02 VITALS — BP 166/90 | HR 73 | Temp 98.3°F | Resp 16 | Wt 205.0 lb

## 2013-07-02 DIAGNOSIS — N184 Chronic kidney disease, stage 4 (severe): Secondary | ICD-10-CM

## 2013-07-02 DIAGNOSIS — M329 Systemic lupus erythematosus, unspecified: Secondary | ICD-10-CM

## 2013-07-02 LAB — LIPID PANEL
CHOL/HDL RATIO: 4.1 ratio
CHOLESTEROL: 144 mg/dL (ref 0–200)
HDL: 35 mg/dL — AB (ref >39)
LDL Cholesterol: 86 mg/dL (ref 0–99)
TRIGLYCERIDES: 117 mg/dL (ref <150)
VLDL: 23 mg/dL (ref 0–40)

## 2013-07-02 MED ORDER — HYDROXYCHLOROQUINE SULFATE 200 MG PO TABS: 200.0000 mg | ORAL_TABLET | Freq: Two times a day (BID) | ORAL | Status: DC

## 2013-07-02 MED ORDER — CALCIUM-VITAMIN D 250-125 MG-UNIT PO TABS: 1.0000 | ORAL_TABLET | Freq: Every day | ORAL | Status: DC

## 2013-07-02 NOTE — Progress Notes (Unsigned)
Patient here to establish care\ Has history of lupus

## 2013-07-02 NOTE — Progress Notes (Unsigned)
Patient ID: Jenna [name], female   DOB: 1955-10-17, 58 y.o.   MRN: [id number]   HPI: Jenna [name] is a 58 y.o. female presenting on 07/02/2013 with PMH as below who come in to our clinic due to loss of insurance. She is taking her Plaquenil but no other medications. She c/o pain in her left knee and was seen by Dr Amil Amen (Rheum) and was receiving shots in her knee which were helping. She need to re-establish care with him.     Past Medical History  Diagnosis Date  . Lupus (systemic lupus erythematosus)   . Obesity   . Renal stone     History reviewed. No pertinent past surgical history.  Current Outpatient Prescriptions  Medication Sig Dispense Refill  . hydroxychloroquine (PLAQUENIL) 200 MG tablet Take 200 mg by mouth 2 (two) times daily.      Marland Kitchen PREDNISONE, PAK, PO Take by mouth.       No current facility-administered medications for this visit.    Allergies  Allergen Reactions  . Penicillins Other (See Comments)    Light headed    Family History  Problem Relation Age of Onset  . Diabetes Mother   . Hypertension Father   . Cancer Sister   . Hypertension Brother     History   Social History  . Marital Status: Legally Separated    Spouse Name: N/A    Number of Children: N/A  . Years of Education: N/A   Occupational History  . Not on file.   Social History Main Topics  . Smoking status: Never Smoker   . Smokeless tobacco: Never Used  . Alcohol Use: No  . Drug Use: No  . Sexual Activity: Not on file   Other Topics Concern  . Not on file   Social History Narrative  . No narrative on file    Review of Systems  Review of Systems  Constitutional: Negative for fever, chills, diaphoresis, activity change, appetite change and fatigue.  HENT: Negative for ear pain, nosebleeds, congestion, facial swelling, rhinorrhea, neck pain, neck stiffness and ear discharge.  Eyes: Negative for pain, discharge, redness, itching and visual disturbance.  Respiratory:  Negative for cough, choking, chest tightness, shortness of breath, wheezing and stridor.  Cardiovascular: Negative for chest pain, palpitations and leg swelling.  Gastrointestinal: Negative for abdominal distention, vomiting, diarrhea or consitpation Genitourinary: Negative for dysuria, urgency, frequency, hematuria, flank pain, decreased urine volume, difficulty urinating and dyspareunia.  Musculoskeletal: per HPI Neurological: Negative for dizziness, tremors, seizures, syncope, facial asymmetry, speech difficulty, weakness, light-headedness, numbness and headaches.  Hematological: Negative for adenopathy. Does not bruise/bleed easily.  Psychiatric/Behavioral: Negative for hallucinations, behavioral problems, confusion, dysphoric mood   Objective:  BP 166/90  Pulse 73  Temp(Src) 98.3 F (36.8 C)  Resp 16  Wt 205 lb (92.987 kg)  SpO2 100% Filed Weights   07/02/13 0915  Weight: 205 lb (92.987 kg)     Physical Exam  Constitutional: Appears well-developed and well-nourished. No distress. HENT: Normocephalic. External right and left ear normal. Oropharynx is clear and moist.  Eyes: Conjunctivae and EOM are normal. PERRLA, no scleral icterus.  Neck: Normal ROM. Neck supple. No JVD. No tracheal deviation. No thyromegaly.  CVS: RRR, S1/S2 +, no murmurs, no gallops, no carotid bruit.  Pulmonary: Effort and breath sounds normal, no stridor, rhonchi, wheezes, rales.  Abdominal: Soft. BS +,  no distension, tenderness, rebound or guarding.  Musculoskeletal: Normal range of motion. No edema and no tenderness.  Neuro:  Alert. Normal reflexes, muscle tone coordination. No cranial nerve deficit. Skin: Skin is warm and dry. No rash noted. Not diaphoretic. No erythema. No pallor.  Psychiatric: Normal mood and affect. Behavior, judgment, thought content normal.   Lab Results  Component Value Date   WBC 2.7* 05/05/2013   HGB 10.4* 05/05/2013   HCT 32.2* 05/05/2013   MCV 84.7 05/05/2013   PLT 133*  05/05/2013   Lab Results  Component Value Date   CREATININE 2.16* 05/05/2013   BUN 30* 05/05/2013   NA 139 05/05/2013   K 4.8 05/05/2013   CL 104 05/05/2013   CO2 23 05/05/2013    Lab Results  Component Value Date   HGBA1C 5.9* 05/05/2013   Lipid Panel     Component Value Date/Time   CHOL 121 05/05/2013 0414   TRIG 105 05/05/2013 0414   HDL 30* 05/05/2013 0414   CHOLHDL 4.0 05/05/2013 0414   VLDL 21 05/05/2013 0414   LDLCALC 70 05/05/2013 0414        Patient Active Problem List   Diagnosis Date Noted  . CKD (chronic kidney disease) stage 4, GFR 15-29 ml/min 07/02/2013  . TIA (transient ischemic attack) 05/05/2013  . Numbness and tingling of left side of face 05/04/2013  . Lupus 04/15/2011     Preventative Medicine:  Health Maintenance  Topic Date Due  . Tetanus/tdap  09/28/1974  . Colonoscopy  09/27/2005  . Influenza Vaccine  09/15/2013  . Mammogram  04/29/2014  . Pap Smear  04/27/2015    Adult vaccines due  Topic Date Due  . Tetanus/tdap  09/28/1974     LAB WORK:  Have ordered the following Vitamin D : Lipid Panel: TSH:    Assessment and plan: CKD (chronic kidney disease) stage 4, GFR 15-29 ml/min - referral to nephrology  Lupus (systemic lupus erythematosus) - cont plaquenil - will need Vicodin on a monthly basis- if she is able to establish care with a rheumatologist in 1 month, he/ or she will provider prescription, otherwise, we will prescribe it   F/u in 1 month   The patient was given clear instructions to go to ER or return to medical center if symptoms don't improve, worsen or new problems develop. The patient verbalized understanding. The patient was told to call to get lab results if they haven't heard anything in the next week.     Debbe Odea, MD

## 2013-07-03 LAB — TSH: TSH: 2.043 u[IU]/mL (ref 0.350–4.500)

## 2013-07-12 ENCOUNTER — Ambulatory Visit: Payer: No Typology Code available for payment source | Attending: Internal Medicine | Admitting: *Deleted

## 2013-07-12 VITALS — BP 159/95 | HR 81 | Temp 98.8°F | Resp 16

## 2013-07-12 DIAGNOSIS — M329 Systemic lupus erythematosus, unspecified: Secondary | ICD-10-CM

## 2013-07-12 MED ORDER — HYDROCODONE-ACETAMINOPHEN 5-325 MG PO TABS: 1.0000 | ORAL_TABLET | Freq: Two times a day (BID) | ORAL | Status: DC

## 2013-07-12 NOTE — Patient Instructions (Signed)
Lupus Lupus (also called systemic lupus erythematosus, SLE) is a disorder of the body's natural defense system (immune system). In lupus, the immune system attacks various areas of the body (autoimmune disease). CAUSES The cause is unknown. However, lupus runs in families. Certain genes can make you more likely to develop lupus. It is 10 times more common in women than in men. Lupus is also more common in African Americans and Asians. Other factors also play a role, such as viruses (Epstein-Barr virus, EBV), stress, hormones, cigarette smoke, and certain drugs. SYMPTOMS Lupus can affect many parts of the body, including the joints, skin, kidneys, lungs, heart, nervous system, and blood vessels. The signs and symptoms of lupus differ from person to person. The disease can range from mild to life-threatening. Typical features of lupus include:  Butterfly-shaped rash over the face.  Arthritis involving one or more joints.  Kidney disease.  Fever, weight loss, hair loss, fatigue.  Poor circulation in the fingers and toes (Raynaud's disease).  Chest pain when taking deep breaths. Abdominal pain may also occur.  Skin rash in areas exposed to the sun.  Sores in the mouth and nose. DIAGNOSIS Diagnosing lupus can take a long time and is often difficult. An exam and an accurate account of your symptoms and health problems is very important. Blood tests are necessary, though no single test can confirm or rule out lupus. Most people with lupus test positive for antinuclear antibodies (ANA) on a blood test. Additional blood tests, a urine test (urinalysis), and sometimes a kidney or skin tissue sample (biopsy) can help to confirm or rule out lupus. TREATMENT There is no cure for lupus. Your caregiver will develop a treatment plan based on your age, sex, health, symptoms, and lifestyle. The goals are to prevent flares, to treat them when they do occur, and to minimize organ damage and complications. How  the disease may affect each person varies widely. Most people with lupus can live normal lives, but this disorder must be carefully monitored. Treatment must be adjusted as necessary to prevent serious complications. Medicines used for treatment:  Nonsteroidal anti-inflammatory drugs (NSAIDs) decrease inflammation and can help with chest pain, joint pain, and fevers. Examples include ibuprofen and naproxen.  Antimalarial drugs were designed to treat malaria. They also treat fatigue, joint pain, skin rashes, and inflammation of the lungs in patients with lupus.  Corticosteroids are powerful hormones that rapidly suppress inflammation. The lowest dose with the highest benefit will be chosen. They can be given by cream, pills, injections, and through the vein (intravenously).  Immunosuppressive drugs block the making of immune cells. They may be used for kidney or nerve disease. HOME CARE INSTRUCTIONS  Exercise. Low-impact activities can usually help keep joints flexible without being too strenuous.  Rest after periods of exercise.  Avoid excessive sun exposure.  Follow proper nutrition and take supplements as recommended by your caregiver.  Stress management can be helpful. SEEK MEDICAL CARE IF:  You have increased fatigue.  You develop pain.  You develop a rash.  You have an oral temperature above 102 F (38.9 C).  You develop abdominal discomfort.  You develop a headache.  You experience dizziness. [facility] of Neurological Disorders and Stroke: [employer] of Rheumatology: www.rheumatology.[facility] of Arthritis and Musculoskeletal and Skin Diseases: www.niams.[employer] Document Released: 01/22/2002 Document Revised: 04/26/2011 Document Reviewed: 05/15/2009 [facility] Patient Information 2014 [place], [place].

## 2013-07-12 NOTE — Progress Notes (Unsigned)
Patient in today having pain all over. Patient states her Lupus is flaring up. Consulted with Roney Jaffe, NP who prescribed vicodin. Informed patient she will have to get refills from rheumatologist due to [facility] non narcotic policy. Patient verbalized understanding.

## 2013-07-31 ENCOUNTER — Encounter: Payer: Self-pay | Admitting: *Deleted

## 2013-07-31 DIAGNOSIS — M329 Systemic lupus erythematosus, unspecified: Secondary | ICD-10-CM

## 2013-07-31 MED ORDER — HYDROCODONE-ACETAMINOPHEN 5-325 MG PO TABS: 1.0000 | ORAL_TABLET | Freq: Two times a day (BID) | ORAL | Status: DC

## 2013-07-31 NOTE — Progress Notes (Unsigned)
Patient ID: Jenna [name], female   DOB: 11-26-1955, 58 y.o.   MRN: [id number] Patient in today. Patient states she lost her prescription and needs another prescription. Patient does have an appointment with rheumatologist 08/2013. Patient called and informed prescription is at the front and ready for pick up. Vivia Birmingham, RN

## 2013-08-10 ENCOUNTER — Emergency Department (INDEPENDENT_AMBULATORY_CARE_PROVIDER_SITE_OTHER)
Admission: EM | Admit: 2013-08-10 | Discharge: 2013-08-10 | Disposition: A | Discharge status: Nursing Home (Includes ICF) | Payer: No Typology Code available for payment source | Source: Home / Self Care | Attending: Family Medicine | Admitting: Family Medicine

## 2013-08-10 ENCOUNTER — Emergency Department (INDEPENDENT_AMBULATORY_CARE_PROVIDER_SITE_OTHER): Payer: No Typology Code available for payment source

## 2013-08-10 ENCOUNTER — Emergency Department (HOSPITAL_COMMUNITY): Payer: Medicaid Other

## 2013-08-10 ENCOUNTER — Encounter (HOSPITAL_COMMUNITY): Payer: Self-pay | Admitting: Emergency Medicine

## 2013-08-10 ENCOUNTER — Emergency Department (HOSPITAL_COMMUNITY)
Admission: EM | Admit: 2013-08-10 | Discharge: 2013-08-10 | Disposition: A | Discharge status: Home / Self Care | Payer: Medicaid Other | Attending: Emergency Medicine | Admitting: Emergency Medicine

## 2013-08-10 DIAGNOSIS — Z880 Allergy status to penicillin: Secondary | ICD-10-CM | POA: Insufficient documentation

## 2013-08-10 DIAGNOSIS — Z79899 Other long term (current) drug therapy: Secondary | ICD-10-CM | POA: Insufficient documentation

## 2013-08-10 DIAGNOSIS — Z7982 Long term (current) use of aspirin: Secondary | ICD-10-CM | POA: Insufficient documentation

## 2013-08-10 DIAGNOSIS — E669 Obesity, unspecified: Secondary | ICD-10-CM | POA: Insufficient documentation

## 2013-08-10 DIAGNOSIS — J069 Acute upper respiratory infection, unspecified: Secondary | ICD-10-CM | POA: Diagnosis not present

## 2013-08-10 DIAGNOSIS — R071 Chest pain on breathing: Secondary | ICD-10-CM

## 2013-08-10 DIAGNOSIS — R079 Chest pain, unspecified: Secondary | ICD-10-CM | POA: Diagnosis present

## 2013-08-10 DIAGNOSIS — Z87442 Personal history of urinary calculi: Secondary | ICD-10-CM | POA: Insufficient documentation

## 2013-08-10 DIAGNOSIS — M329 Systemic lupus erythematosus, unspecified: Secondary | ICD-10-CM | POA: Diagnosis not present

## 2013-08-10 LAB — CBC WITH DIFFERENTIAL/PLATELET
BASOS ABS: 0 10*3/uL (ref 0.0–0.1)
BASOS PCT: 0 % (ref 0–1)
Eosinophils Absolute: 0.1 10*3/uL (ref 0.0–0.7)
Eosinophils Relative: 3 % (ref 0–5)
HCT: 31.3 % — ABNORMAL LOW (ref 36.0–46.0)
Hemoglobin: 9.9 g/dL — ABNORMAL LOW (ref 12.0–15.0)
LYMPHS PCT: 26 % (ref 12–46)
Lymphs Abs: 0.8 10*3/uL (ref 0.7–4.0)
MCH: 27.7 pg (ref 26.0–34.0)
MCHC: 31.6 g/dL (ref 30.0–36.0)
MCV: 87.4 fL (ref 78.0–100.0)
MONO ABS: 0.1 10*3/uL (ref 0.1–1.0)
Monocytes Relative: 3 % (ref 3–12)
NEUTROS ABS: 2.2 10*3/uL (ref 1.7–7.7)
Neutrophils Relative %: 68 % (ref 43–77)
PLATELETS: 237 10*3/uL (ref 150–400)
RBC: 3.58 MIL/uL — AB (ref 3.87–5.11)
RDW: 14.6 % (ref 11.5–15.5)
WBC: 3.2 10*3/uL — ABNORMAL LOW (ref 4.0–10.5)

## 2013-08-10 LAB — D-DIMER, QUANTITATIVE: D-Dimer, Quant: 3.84 ug/mL-FEU — ABNORMAL HIGH (ref 0.00–0.48)

## 2013-08-10 LAB — I-STAT CHEM 8, ED
BUN: 34 mg/dL — AB (ref 6–23)
CREATININE: 2.2 mg/dL — AB (ref 0.50–1.10)
Calcium, Ion: 1.16 mmol/L (ref 1.12–1.23)
Chloride: 107 mEq/L (ref 96–112)
GLUCOSE: 82 mg/dL (ref 70–99)
HCT: 33 % — ABNORMAL LOW (ref 36.0–46.0)
Hemoglobin: 11.2 g/dL — ABNORMAL LOW (ref 12.0–15.0)
Potassium: 4.8 mEq/L (ref 3.7–5.3)
Sodium: 142 mEq/L (ref 137–147)
TCO2: 21 mmol/L (ref 0–100)

## 2013-08-10 LAB — I-STAT TROPONIN, ED: Troponin i, poc: 0 ng/mL (ref 0.00–0.08)

## 2013-08-10 MED ORDER — TECHNETIUM TO 99M ALBUMIN AGGREGATED
6.0000 | Freq: Once | INTRAVENOUS | Status: AC | PRN
  Administered 2013-08-10: 6 via INTRAVENOUS

## 2013-08-10 MED ORDER — GUAIFENESIN 100 MG/5ML PO LIQD: 100.0000 mg | ORAL | Status: DC | PRN

## 2013-08-10 MED ORDER — HYDROCODONE-ACETAMINOPHEN 5-325 MG PO TABS
2.0000 | ORAL_TABLET | Freq: Once | ORAL | Status: DC
  Filled 2013-08-10: qty 2

## 2013-08-10 NOTE — ED Provider Notes (Signed)
CSN: [id number]     Arrival date & time 08/10/13  1043 History   First MD Initiated Contact with Patient 08/10/13 1055     Chief Complaint  Patient presents with  . Chest Pain   (Consider location/radiation/quality/duration/timing/severity/associated sxs/prior Treatment) Patient is a 58 y.o. female presenting with chest pain. The history is provided by the patient.  Chest Pain Pain location:  R lateral chest Pain quality: sharp   Pain radiates to:  Does not radiate Pain radiates to the back: no   Pain severity:  Mild Onset quality:  Gradual Duration:  3 days Progression:  Unchanged Chronicity:  New Context: breathing and movement   Associated symptoms: no fever, no palpitations, no shortness of breath and no weakness     Past Medical History  Diagnosis Date  . Lupus (systemic lupus erythematosus)   . Obesity   . Renal stone    History reviewed. No pertinent past surgical history. Family History  Problem Relation Age of Onset  . Diabetes Mother   . Hypertension Father   . Cancer Sister   . Hypertension Brother    History  Substance Use Topics  . Smoking status: Never Smoker   . Smokeless tobacco: Never Used  . Alcohol Use: No   OB History   Grav Para Term Preterm Abortions TAB SAB Ect Mult Living                 Review of Systems  Constitutional: Negative.  Negative for fever.  HENT: Positive for congestion, postnasal drip and rhinorrhea.   Respiratory: Negative.  Negative for shortness of breath.   Cardiovascular: Positive for chest pain. Negative for palpitations.  Genitourinary: Negative.   Neurological: Negative for weakness.    Allergies  Penicillins  Home Medications   Prior to Admission medications   Medication Sig Start Date End Date Taking? Authorizing Joliyah Lippens  acetaminophen (TYLENOL) 500 MG tablet Take 1,000 mg by mouth 3 (three) times daily as needed (pain).    Historical Colbey Wirtanen, MD  aspirin EC 81 MG tablet Take 1 tablet (81 mg total) by  mouth daily. 05/06/13   Thurnell Lose, MD  calcium-vitamin D (OSCAL) 250-125 MG-UNIT per tablet Take 1 tablet by mouth daily. 07/02/13   Debbe Odea, MD  HYDROcodone-acetaminophen (NORCO/VICODIN) 5-325 MG per tablet Take 1 tablet by mouth 2 (two) times daily. 07/31/13   Angelica Chessman, MD  hydroxychloroquine (PLAQUENIL) 200 MG tablet Take 1 tablet (200 mg total) by mouth 2 (two) times daily. 07/02/13   Debbe Odea, MD  PREDNISONE, PAK, PO Take by mouth.    Historical Lizann Edelman, MD   BP 180/74  Pulse 82  Temp(Src) 98.6 F (37 C) (Oral)  Resp 20  SpO2 98% Physical Exam  Nursing note and vitals reviewed. Constitutional: She is oriented to person, place, and time. She appears well-developed and well-nourished.  HENT:  Head: Normocephalic.  Right Ear: External ear normal.  Left Ear: External ear normal.  Mouth/Throat: Oropharynx is clear and moist.  Eyes: Conjunctivae are normal. Pupils are equal, round, and reactive to light.  Neck: Normal range of motion. Neck supple.  Cardiovascular: Normal rate, regular rhythm, normal heart sounds and intact distal pulses.   Pulmonary/Chest: Effort normal and breath sounds normal. She exhibits tenderness.  Lymphadenopathy:    She has no cervical adenopathy.  Neurological: She is alert and oriented to person, place, and time.  Skin: Skin is warm and dry.    ED Course  Procedures (including critical care time) Labs  Review Labs Reviewed - No data to display  Imaging Review Dg Chest 2 View  08/10/2013   CLINICAL DATA:  Right-sided chest pain and upper RIGHT back pain for 2 days, pain with deep inspiration, history hypertension, lupus  EXAM: CHEST  2 VIEW  COMPARISON:  04/15/2011.  FINDINGS: Enlargement of cardiac silhouette.  Mediastinal contours stable with elongation of thoracic aorta.  Linear subsegmental atelectasis RIGHT middle lobe.  Minimal LEFT basilar atelectasis.  No acute infiltrate, pleural effusion or pneumothorax.  Callus identified  at lateral LEFT sixth rib question healing fracture.  IMPRESSION: Bibasilar atelectasis greater on RIGHT.  Suspected healing fracture lateral LEFT sixth rib ; correlation for pain/tenderness at this site recommended.   Electronically Signed   By: Lavonia Dana M.D.   On: 08/10/2013 11:33   X-rays reviewed and report per radiologist.   MDM   1. Chest pain on breathing    Sent for pleuritic right sided chest pain with sob and abnl cxr on right, concern re possible pulm emb.    Billy Fischer, MD 08/10/13 1147

## 2013-08-10 NOTE — ED Notes (Signed)
Pt placed back on monitor, continuous pulse oximetry and blood pressure cuff; family at bedside

## 2013-08-10 NOTE — ED Notes (Signed)
Pt refused wheelchair, escorted out [employer].

## 2013-08-10 NOTE — Discharge Instructions (Signed)
Upper Respiratory Infection, Adult An upper respiratory infection (URI) is also known as the common cold. It is often caused by a type of germ (virus). Colds are easily spread (contagious). You can pass it to others by kissing, coughing, sneezing, or drinking out of the same glass. Usually, you get better in 1 or 2 weeks.  HOME CARE   Only take medicine as told by your doctor.  Use a warm mist humidifier or breathe in steam from a hot shower.  Drink enough water and fluids to keep your pee (urine) clear or pale yellow.  Get plenty of rest.  Return to work when your temperature is back to normal or as told by your doctor. You may use a face mask and wash your hands to stop your cold from spreading. GET HELP RIGHT AWAY IF:   After the first few days, you feel you are getting worse.  You have questions about your medicine.  You have chills, shortness of breath, or brown or red spit (mucus).  You have yellow or brown snot (nasal discharge) or pain in the face, especially when you bend forward.  You have a fever, puffy (swollen) neck, pain when you swallow, or white spots in the back of your throat.  You have a bad headache, ear pain, sinus pain, or chest pain.  You have a high-pitched whistling sound when you breathe in and out (wheezing).  You have a lasting cough or cough up blood.  You have sore muscles or a stiff neck. MAKE SURE YOU:   Understand these instructions.  Will watch your condition.  Will get help right away if you are not doing well or get worse. Document Released: 07/21/2007 Document Revised: 04/26/2011 Document Reviewed: 06/08/2010 ExitCare Patient Information 2015 [employer]. This information is not intended to replace advice given to you by your health care provider. Make sure you discuss any questions you have with your health care provider.  

## 2013-08-10 NOTE — ED Notes (Signed)
Ambulated pt with pulse oximetry; pt stated 99% - 96%RA with a heart rate of 88-89bpm; pt now resting back on stretcher and watching tv; family at bedside; Gertie Fey, [place] aware

## 2013-08-10 NOTE — ED Notes (Signed)
Patient c/o right side rib pain under her breast x 2 days. Patient reports she has been coughing up mucuous from a recent cold. Patient is alert and oriented and in no acute distress.

## 2013-08-10 NOTE — ED Notes (Signed)
Refused IV Start at this time.

## 2013-08-10 NOTE — ED Notes (Signed)
Valarie Merino, RN and Maudie Mercury, Phlebotomy at bedside

## 2013-08-10 NOTE — ED Provider Notes (Signed)
Medical screening examination/treatment/procedure(s) were conducted as a shared visit with non-physician practitioner(s) and myself.  I personally evaluated the patient during the encounter.   EKG Interpretation   Date/Time:  Friday August 10 2013 12:19:16 EDT Ventricular Rate:  76 PR Interval:  140 QRS Duration: 102 QT Interval:  394 QTC Calculation: 443 R Axis:   -32 Text Interpretation:  Normal sinus rhythm Left axis deviation Non-specific  ST-t changes `similar st/t changes inf and lat  noted on prior ecg  Confirmed by STEINL  MD, Lennette Bihari ([id number]) on 08/10/2013 1:17:29 PM      Pt c/o mildly pleuritic pain right anterior/lateral chest and right latpost chest. Constant x couple days. No cough or fever. Chest cta. +chest wall tenderness. Labs. No shingles/rash to area of pain.    Mirna Mires, MD 08/10/13 [phone]

## 2013-08-10 NOTE — ED Notes (Signed)
Pt sent here from [facility]. Reports right side chest pain and rib pain x 2 days, occurs with inspiration. Sent here for ct scan. Also having pain and swelling to right index finger and a "knot" on right forearm. No acute distress noted at triage, airway intact.

## 2013-08-10 NOTE — ED Provider Notes (Signed)
CSN: [id number]     Arrival date & time 08/10/13  1201 History   First MD Initiated Contact with Patient 08/10/13 1305     Chief Complaint  Patient presents with  . Chest Pain  . Hand Pain     (Consider location/radiation/quality/duration/timing/severity/associated sxs/prior Treatment) HPI  58 year old obese female with history of lupus who was sent here from urgent care for evaluation of chest discomfort and to rule out PE. Patient experiencing right-sided chest pain and rib pain ongoing for the past 2-3 days, worsening with inspiration.  She also had GI symptoms include nasal congestion, occasional sneeze and nonproductive cough. She complaining of right lateral chest discomfort which has been ongoing for the past 2-3 months. Worsening with palpation denies any specific injury. She complaining of pain to her right index finger and noticed a knot on her right forearm 2 days ago. Pain is sharp, persistent, similar to her lupus pain. She has no prior history of DVT or PE. No recent surgery, no prolonged bed rest, no unilateral leg swelling or calf pain, no history of cancer and not taking any hormone at this time. Her lupus as controlled with Plaquenil and pain medication.   Past Medical History  Diagnosis Date  . Lupus (systemic lupus erythematosus)   . Obesity   . Renal stone    History reviewed. No pertinent past surgical history. Family History  Problem Relation Age of Onset  . Diabetes Mother   . Hypertension Father   . Cancer Sister   . Hypertension Brother    History  Substance Use Topics  . Smoking status: Never Smoker   . Smokeless tobacco: Never Used  . Alcohol Use: No   OB History   Grav Para Term Preterm Abortions TAB SAB Ect Mult Living                 Review of Systems  All other systems reviewed and are negative.     Allergies  Penicillins  Home Medications   Prior to Admission medications   Medication Sig Start Date End Date Taking? Authorizing  Provider  acetaminophen (TYLENOL) 500 MG tablet Take 1,000 mg by mouth 3 (three) times daily as needed (pain).    Historical Provider, MD  aspirin EC 81 MG tablet Take 1 tablet (81 mg total) by mouth daily. 05/06/13   Thurnell Lose, MD  calcium-vitamin D (OSCAL) 250-125 MG-UNIT per tablet Take 1 tablet by mouth daily. 07/02/13   Debbe Odea, MD  HYDROcodone-acetaminophen (NORCO/VICODIN) 5-325 MG per tablet Take 1 tablet by mouth 2 (two) times daily. 07/31/13   Angelica Chessman, MD  hydroxychloroquine (PLAQUENIL) 200 MG tablet Take 1 tablet (200 mg total) by mouth 2 (two) times daily. 07/02/13   Debbe Odea, MD  PREDNISONE, PAK, PO Take by mouth.    Historical Provider, MD   BP 154/101  Pulse 82  Temp(Src) 98.1 F (36.7 C) (Oral)  Resp 18  SpO2 100% Physical Exam  Nursing note and vitals reviewed. Constitutional: She is oriented to person, place, and time. She appears well-developed and well-nourished. No distress.  HENT:  Head: Atraumatic.  Mouth/Throat: Oropharynx is clear and moist.  Eyes: Conjunctivae are normal.  Neck: Neck supple. No JVD present.  Cardiovascular: Normal rate and regular rhythm.   Pulmonary/Chest: She exhibits tenderness (tenderness to right anterior lateral aspects of chest without any overlying skin changes, crepitus, or emphysema.).  Abdominal: Soft. There is no tenderness.  Musculoskeletal: She exhibits tenderness (Tenderness to right index finger with  mild swelling noted but no obvious joint infection. Right forearm with tenderness to mid volar aspects without any obvious palpable cords or edema noted. Radial pulse 2+.). She exhibits no edema.  Neurological: She is alert and oriented to person, place, and time.  Skin: No rash noted.  Psychiatric: She has a normal mood and affect.    ED Course  Procedures (including critical care time)  1:24 PM Patient was sent here from urgent care for PE rule out given her history of lupus. She also has evidence of URI  symptoms and a chest x-ray needed for focal infiltrate. She is currently not tachycardic and in no acute respiratory distress. D-dimer ordered.  4:27 PM D-dimer elevated at 3.84.  Pt has evidence of renal insufficiency with Cr. 2.20.  VQ scan ordered to r/o PE.  Care discussed with Dr. Ashok Cordia.    5:59 PM VQ scan with no evidence of PE. I suspect patient's symptoms is likely URI as previously mentioned. Will treat symptoms only. Patient can followup with PCP for further management. Otherwise patient stable for discharge and no evidence of hypoxia.   Labs Review Labs Reviewed  CBC WITH DIFFERENTIAL - Abnormal; Notable for the following:    WBC 3.2 (*)    RBC 3.58 (*)    Hemoglobin 9.9 (*)    HCT 31.3 (*)    All other components within normal limits  D-DIMER, QUANTITATIVE - Abnormal; Notable for the following:    D-Dimer, Quant 3.84 (*)    All other components within normal limits  I-STAT CHEM 8, ED - Abnormal; Notable for the following:    BUN 34 (*)    Creatinine, Ser 2.20 (*)    Hemoglobin 11.2 (*)    HCT 33.0 (*)    All other components within normal limits  I-STAT TROPOININ, ED    Imaging Review Dg Chest 2 View  08/10/2013   CLINICAL DATA:  Right-sided chest pain and upper RIGHT back pain for 2 days, pain with deep inspiration, history hypertension, lupus  EXAM: CHEST  2 VIEW  COMPARISON:  04/15/2011.  FINDINGS: Enlargement of cardiac silhouette.  Mediastinal contours stable with elongation of thoracic aorta.  Linear subsegmental atelectasis RIGHT middle lobe.  Minimal LEFT basilar atelectasis.  No acute infiltrate, pleural effusion or pneumothorax.  Callus identified at lateral LEFT sixth rib question healing fracture.  IMPRESSION: Bibasilar atelectasis greater on RIGHT.  Suspected healing fracture lateral LEFT sixth rib ; correlation for pain/tenderness at this site recommended.   Electronically Signed   By: Lavonia Dana M.D.   On: 08/10/2013 11:33     EKG  Interpretation   Date/Time:  Friday August 10 2013 12:19:16 EDT Ventricular Rate:  76 PR Interval:  140 QRS Duration: 102 QT Interval:  394 QTC Calculation: 443 R Axis:   -32 Text Interpretation:  Normal sinus rhythm Left axis deviation Non-specific  ST-t changes `similar st/t changes inf and lat  noted on prior ecg  Confirmed by STEINL  MD, Lennette Bihari ([id number]) on 08/10/2013 1:17:29 PM      MDM   Final diagnoses:  URI (upper respiratory infection)  Lupus    BP 150/104  Pulse 74  Temp(Src) 98.1 F (36.7 C) (Oral)  Resp 18  SpO2 96%  I have reviewed nursing notes and vital signs. I personally reviewed the imaging tests through PACS system  I reviewed available ER/hospitalization records thought the EMR\     Domenic Moras, PA-C 08/10/13 1800

## 2013-08-10 NOTE — ED Notes (Signed)
Pt refused wheelchair; escorted pt and family out of department

## 2013-08-10 NOTE — ED Notes (Signed)
Patient C/O right lateral chest and back pain that has been ongoing for 3 months. States that she has had right sided pneumonia for which she was treated with a Z-pak. States that the pain has not improved over time so she comes to the ED for evaluation.  Patient also C/O having a headache.

## 2013-08-29 ENCOUNTER — Telehealth: Payer: Self-pay | Admitting: Internal Medicine

## 2013-08-29 NOTE — Telephone Encounter (Signed)
Pt dropped off another copy of [employer] Bus application, handed to JP for Dr A. Pt requesting that it be faxed back to [employer].

## 2013-08-30 ENCOUNTER — Ambulatory Visit: Payer: No Typology Code available for payment source

## 2013-09-18 ENCOUNTER — Other Ambulatory Visit: Payer: Self-pay

## 2013-09-18 NOTE — Progress Notes (Unsigned)
Patient  [employer] scat form filled out and signed by Dr jegede Patient is here to pick up form

## 2013-09-24 ENCOUNTER — Ambulatory Visit: Payer: Medicaid Other | Attending: Internal Medicine | Admitting: Internal Medicine

## 2013-09-24 ENCOUNTER — Encounter: Payer: Self-pay | Admitting: Internal Medicine

## 2013-09-24 ENCOUNTER — Ambulatory Visit: Payer: No Typology Code available for payment source | Admitting: Internal Medicine

## 2013-09-24 VITALS — BP 151/94 | HR 86 | Temp 98.6°F | Resp 16 | Ht 67.5 in | Wt 211.0 lb

## 2013-09-24 DIAGNOSIS — E669 Obesity, unspecified: Secondary | ICD-10-CM | POA: Insufficient documentation

## 2013-09-24 DIAGNOSIS — G894 Chronic pain syndrome: Secondary | ICD-10-CM | POA: Diagnosis not present

## 2013-09-24 DIAGNOSIS — Z79899 Other long term (current) drug therapy: Secondary | ICD-10-CM | POA: Diagnosis not present

## 2013-09-24 DIAGNOSIS — M329 Systemic lupus erythematosus, unspecified: Secondary | ICD-10-CM | POA: Diagnosis not present

## 2013-09-24 DIAGNOSIS — Z880 Allergy status to penicillin: Secondary | ICD-10-CM | POA: Diagnosis not present

## 2013-09-24 MED ORDER — ACETAMINOPHEN-CODEINE #3 300-30 MG PO TABS: 1.0000 | ORAL_TABLET | Freq: Three times a day (TID) | ORAL | Status: DC | PRN

## 2013-09-24 NOTE — Progress Notes (Signed)
Pt is here following up on her lupus. Pt states that yesterday she is having severe pain in her left heel, at her left shin and her left wrist.

## 2013-09-24 NOTE — Patient Instructions (Signed)
Lupus Lupus (also called systemic lupus erythematosus, SLE) is a disorder of the body's natural defense system (immune system). In lupus, the immune system attacks various areas of the body (autoimmune disease). CAUSES The cause is unknown. However, lupus runs in families. Certain genes can make you more likely to develop lupus. It is 10 times more common in women than in men. Lupus is also more common in African Americans and Asians. Other factors also play a role, such as viruses (Epstein-Barr virus, EBV), stress, hormones, cigarette smoke, and certain drugs. SYMPTOMS Lupus can affect many parts of the body, including the joints, skin, kidneys, lungs, heart, nervous system, and blood vessels. The signs and symptoms of lupus differ from person to person. The disease can range from mild to life-threatening. Typical features of lupus include:  Butterfly-shaped rash over the face.  Arthritis involving one or more joints.  Kidney disease.  Fever, weight loss, hair loss, fatigue.  Poor circulation in the fingers and toes (Raynaud's disease).  Chest pain when taking deep breaths. Abdominal pain may also occur.  Skin rash in areas exposed to the sun.  Sores in the mouth and nose. DIAGNOSIS Diagnosing lupus can take a long time and is often difficult. An exam and an accurate account of your symptoms and health problems is very important. Blood tests are necessary, though no single test can confirm or rule out lupus. Most people with lupus test positive for antinuclear antibodies (ANA) on a blood test. Additional blood tests, a urine test (urinalysis), and sometimes a kidney or skin tissue sample (biopsy) can help to confirm or rule out lupus. TREATMENT There is no cure for lupus. Your caregiver will develop a treatment plan based on your age, sex, health, symptoms, and lifestyle. The goals are to prevent flares, to treat them when they do occur, and to minimize organ damage and complications. How  the disease may affect each person varies widely. Most people with lupus can live normal lives, but this disorder must be carefully monitored. Treatment must be adjusted as necessary to prevent serious complications. Medicines used for treatment:  Nonsteroidal anti-inflammatory drugs (NSAIDs) decrease inflammation and can help with chest pain, joint pain, and fevers. Examples include ibuprofen and naproxen.  Antimalarial drugs were designed to treat malaria. They also treat fatigue, joint pain, skin rashes, and inflammation of the lungs in patients with lupus.  Corticosteroids are powerful hormones that rapidly suppress inflammation. The lowest dose with the highest benefit will be chosen. They can be given by cream, pills, injections, and through the vein (intravenously).  Immunosuppressive drugs block the making of immune cells. They may be used for kidney or nerve disease. HOME CARE INSTRUCTIONS  Exercise. Low-impact activities can usually help keep joints flexible without being too strenuous.  Rest after periods of exercise.  Avoid excessive sun exposure.  Follow proper nutrition and take supplements as recommended by your caregiver.  Stress management can be helpful. SEEK MEDICAL CARE IF:  You have increased fatigue.  You develop pain.  You develop a rash.  You have an oral temperature above 102 F (38.9 C).  You develop abdominal discomfort.  You develop a headache.  You experience dizziness. FOR MORE INFORMATION National Institute of Neurological Disorders and Stroke: www.ninds.nih.gov American College of Rheumatology: www.rheumatology.org National Institute of Arthritis and Musculoskeletal and Skin Diseases: www.niams.nih.gov Document Released: 01/22/2002 Document Revised: 04/26/2011 Document Reviewed: 05/15/2009 ExitCare Patient Information 2015 [employer]. This information is not intended to replace advice given to you by your health   care provider. Make sure  you discuss any questions you have with your health care provider.  

## 2013-09-24 NOTE — Progress Notes (Signed)
Patient ID: Jenna [name], female   DOB: Nov 02, 1955, 58 y.o.   MRN: [id number]   Jenna [name], is a 58 y.o. female  [id number]:[id number]  [id number]  DOB - 08-29-1955  Chief Complaint  Patient presents with  . Follow-up        Subjective:   Jenna [name] is a 58 y.o. female here today for a follow up visit. Patient has history of systemic lupus erythematosus, with chronic pain syndrome, obesity here today complaining of leg pain that comes from her left heel up to the sides of the left leg. There is no swelling, no calf pain. She reports no injury or fall. She is on Plaquenil for her lupus. She is requesting something for pain. Her recent blood tests were reviewed and noticed to have anemia probably from chronic disease. Patient has No headache, No chest pain, No abdominal pain - No Nausea, No new weakness tingling or numbness, No Cough - SOB.  Problem  Lupus (Systemic Lupus Erythematosus)    ALLERGIES: Allergies  Allergen Reactions  . Penicillins Other (See Comments)    Light headed    PAST MEDICAL HISTORY: Past Medical History  Diagnosis Date  . Lupus (systemic lupus erythematosus)   . Obesity   . Renal stone     MEDICATIONS AT HOME: Prior to Admission medications   Medication Sig Start Date End Date Taking? Authorizing Provider  acetaminophen (TYLENOL) 500 MG tablet Take 1,000 mg by mouth 3 (three) times daily as needed (pain).   Yes Historical Provider, MD  HYDROcodone-acetaminophen (NORCO/VICODIN) 5-325 MG per tablet Take 1 tablet by mouth every 6 (six) hours as needed for moderate pain.   Yes Historical Provider, MD  hydroxychloroquine (PLAQUENIL) 200 MG tablet Take 1 tablet (200 mg total) by mouth 2 (two) times daily. 07/02/13  Yes Calvert Cantor, MD  acetaminophen-codeine (TYLENOL #3) 300-30 MG per tablet Take 1 tablet by mouth every 8 (eight) hours as needed for moderate pain. 09/24/13   Jeanann Lewandowsky, MD  guaiFENesin (ROBITUSSIN) 100 MG/5ML liquid Take 5-10 mLs  (100-200 mg total) by mouth every 4 (four) hours as needed for cough. 08/10/13   Fayrene Helper, PA-C     Objective:   Filed Vitals:   09/24/13 1710  BP: 151/94  Pulse: 86  Temp: 98.6 F (37 C)  TempSrc: Oral  Resp: 16  Height: 5' 7.5" (1.715 m)  Weight: 211 lb (95.709 kg)  SpO2: 98%    Exam General appearance : Awake, alert, not in any distress. Speech Clear. Not toxic looking HEENT: Atraumatic and Normocephalic, pupils equally reactive to light and accomodation Neck: supple, no JVD. No cervical lymphadenopathy.  Chest:Good air entry bilaterally, no added sounds  CVS: S1 S2 regular, no murmurs.  Abdomen: Bowel sounds present, Non tender and not distended with no gaurding, rigidity or rebound. Extremities: B/L Lower Ext shows no edema, both legs are warm to touch Neurology: Awake alert, and oriented X 3, CN II-XII intact, Non focal  Data Review Lab Results  Component Value Date   HGBA1C 5.9* 05/05/2013     Assessment & Plan   1. Lupus (systemic lupus erythematosus)  - acetaminophen-codeine (TYLENOL #3) 300-30 MG per tablet; Take 1 tablet by mouth every 8 (eight) hours as needed for moderate pain.  Dispense: 90 tablet; Refill: 0  She was counseled extensively on nutrition and exercise  Return in about 3 months (around 12/25/2013), or if symptoms worsen or fail to improve, for Follow up HTN, Lupus, Follow up Pain and comorbidities.  The patient was given clear instructions to go to ER or return to medical center if symptoms don't improve, worsen or new problems develop. The patient verbalized understanding. The patient was told to call to get lab results if they haven't heard anything in the next week.   This note has been created with Surveyor, quantity. Any transcriptional errors are unintentional.    Angelica Chessman, MD, [place], [place], [place] and [place], [place]     09/24/2013, 5:35 PM

## 2013-09-28 ENCOUNTER — Telehealth: Payer: Self-pay | Admitting: Internal Medicine

## 2013-09-28 ENCOUNTER — Other Ambulatory Visit: Payer: Self-pay | Admitting: *Deleted

## 2013-09-28 MED ORDER — ENALAPRIL MALEATE 5 MG PO TABS
5.0000 mg | ORAL_TABLET | Freq: Every day | ORAL | Status: DC
Start: 2013-09-28 — End: 2013-11-19

## 2013-09-28 NOTE — Progress Notes (Signed)
Pt called needing BP medication. I sent the RX to the [facility] pharmacy.

## 2013-09-28 NOTE — Telephone Encounter (Signed)
Pt. Calling to get some blood pressure medication for her because her other blood pressure has expired. Please f/u with pt.

## 2013-10-01 ENCOUNTER — Telehealth: Payer: Self-pay | Admitting: Emergency Medicine

## 2013-10-01 ENCOUNTER — Ambulatory Visit: Payer: No Typology Code available for payment source | Admitting: Internal Medicine

## 2013-10-01 NOTE — Telephone Encounter (Signed)
Attempted to respond to pt call but number listed unavailable

## 2013-10-18 ENCOUNTER — Ambulatory Visit: Payer: No Typology Code available for payment source | Attending: Internal Medicine | Admitting: *Deleted

## 2013-10-18 VITALS — BP 116/81 | HR 84 | Resp 16

## 2013-10-18 DIAGNOSIS — M329 Systemic lupus erythematosus, unspecified: Secondary | ICD-10-CM

## 2013-10-18 NOTE — Patient Instructions (Signed)
Blood Pressure Record Sheet Your blood pressure on this visit to the emergency department or clinic is elevated. This does not necessarily mean you have high blood pressure (hypertension), but it does mean that your blood pressure needs to be rechecked. Many times your blood pressure can increase due to illness, pain, anxiety, or other factors. We recommend that you get a series of blood pressure readings done over a period of 5 days. It is best to get a reading in the morning and one in the evening. You should make sure to sit and relax for 1-5 minutes before the reading is taken. Write the readings down and make a follow-up appointment with your health care provider to discuss the results. If there is not a free clinic or a drug store with a blood-pressure-taking machine near you, you can purchase blood-pressure-taking equipment from a drug store. Having one in the home allows you the convenience of taking your blood pressure while you are home and relaxed.  Your blood pressure in the emergency department or clinic on ________ was ____________________. BLOOD PRESSURE LOG Date: _______________________  a.m. _____________________  p.m. _____________________ Date: _______________________  a.m. _____________________  p.m. _____________________ Date: _______________________  a.m. _____________________  p.m. _____________________ Date: _______________________  a.m. _____________________  p.m. _____________________ Date: _______________________  a.m. _____________________  p.m. _____________________ Document Released: 10/31/2002 Document Revised: 06/18/2013 Document Reviewed: 03/27/2013 Exit[employer] Patient Information 2015 [place], [place]. This information is not intended to replace advice given to you by your health care provider. Make sure you discuss any questions you have with your health care provider.

## 2013-11-19 ENCOUNTER — Encounter: Payer: Self-pay | Admitting: Internal Medicine

## 2013-11-19 ENCOUNTER — Ambulatory Visit: Payer: Medicaid Other | Attending: Internal Medicine | Admitting: Internal Medicine

## 2013-11-19 VITALS — BP 156/104 | HR 82 | Temp 98.8°F | Resp 16 | Ht 67.5 in | Wt 214.0 lb

## 2013-11-19 DIAGNOSIS — M545 Low back pain: Secondary | ICD-10-CM | POA: Diagnosis not present

## 2013-11-19 DIAGNOSIS — I129 Hypertensive chronic kidney disease with stage 1 through stage 4 chronic kidney disease, or unspecified chronic kidney disease: Secondary | ICD-10-CM | POA: Diagnosis not present

## 2013-11-19 DIAGNOSIS — Z23 Encounter for immunization: Secondary | ICD-10-CM

## 2013-11-19 DIAGNOSIS — Z6833 Body mass index (BMI) 33.0-33.9, adult: Secondary | ICD-10-CM | POA: Diagnosis not present

## 2013-11-19 DIAGNOSIS — M329 Systemic lupus erythematosus, unspecified: Secondary | ICD-10-CM | POA: Diagnosis not present

## 2013-11-19 DIAGNOSIS — G894 Chronic pain syndrome: Secondary | ICD-10-CM | POA: Diagnosis not present

## 2013-11-19 DIAGNOSIS — I10 Essential (primary) hypertension: Secondary | ICD-10-CM

## 2013-11-19 DIAGNOSIS — M25569 Pain in unspecified knee: Secondary | ICD-10-CM | POA: Diagnosis not present

## 2013-11-19 DIAGNOSIS — N184 Chronic kidney disease, stage 4 (severe): Secondary | ICD-10-CM

## 2013-11-19 DIAGNOSIS — Z7189 Other specified counseling: Secondary | ICD-10-CM | POA: Insufficient documentation

## 2013-11-19 DIAGNOSIS — M79606 Pain in leg, unspecified: Secondary | ICD-10-CM | POA: Diagnosis not present

## 2013-11-19 DIAGNOSIS — E669 Obesity, unspecified: Secondary | ICD-10-CM | POA: Diagnosis not present

## 2013-11-19 MED ORDER — BLOOD PRESSURE KIT: PACK | Status: DC

## 2013-11-19 MED ORDER — HYDROXYCHLOROQUINE SULFATE 200 MG PO TABS: 200.0000 mg | ORAL_TABLET | Freq: Two times a day (BID) | ORAL | Status: DC

## 2013-11-19 MED ORDER — ACETAMINOPHEN-CODEINE #3 300-30 MG PO TABS: 1.0000 | ORAL_TABLET | Freq: Three times a day (TID) | ORAL | Status: DC | PRN

## 2013-11-19 MED ORDER — ENALAPRIL MALEATE 10 MG PO TABS: 10.0000 mg | ORAL_TABLET | Freq: Every day | ORAL | Status: DC

## 2013-11-19 NOTE — Progress Notes (Signed)
Pt is here c/o leg pain, knee pain and lower back pain.

## 2013-11-19 NOTE — Patient Instructions (Signed)
Lupus Lupus (also called systemic lupus erythematosus, SLE) is a disorder of the body's natural defense system (immune system). In lupus, the immune system attacks various areas of the body (autoimmune disease). CAUSES The cause is unknown. However, lupus runs in families. Certain genes can make you more likely to develop lupus. It is 10 times more common in women than in men. Lupus is also more common in African Americans and Asians. Other factors also play a role, such as viruses (Epstein-Barr virus, EBV), stress, hormones, cigarette smoke, and certain drugs. SYMPTOMS Lupus can affect many parts of the body, including the joints, skin, kidneys, lungs, heart, nervous system, and blood vessels. The signs and symptoms of lupus differ from person to person. The disease can range from mild to life-threatening. Typical features of lupus include:  Butterfly-shaped rash over the face.  Arthritis involving one or more joints.  Kidney disease.  Fever, weight loss, hair loss, fatigue.  Poor circulation in the fingers and toes (Raynaud's disease).  Chest pain when taking deep breaths. Abdominal pain may also occur.  Skin rash in areas exposed to the sun.  Sores in the mouth and nose. DIAGNOSIS Diagnosing lupus can take a long time and is often difficult. An exam and an accurate account of your symptoms and health problems is very important. Blood tests are necessary, though no single test can confirm or rule out lupus. Most people with lupus test positive for antinuclear antibodies (ANA) on a blood test. Additional blood tests, a urine test (urinalysis), and sometimes a kidney or skin tissue sample (biopsy) can help to confirm or rule out lupus. TREATMENT There is no cure for lupus. Your caregiver will develop a treatment plan based on your age, sex, health, symptoms, and lifestyle. The goals are to prevent flares, to treat them when they do occur, and to minimize organ damage and complications. How  the disease may affect each person varies widely. Most people with lupus can live normal lives, but this disorder must be carefully monitored. Treatment must be adjusted as necessary to prevent serious complications. Medicines used for treatment:  Nonsteroidal anti-inflammatory drugs (NSAIDs) decrease inflammation and can help with chest pain, joint pain, and fevers. Examples include ibuprofen and naproxen.  Antimalarial drugs were designed to treat malaria. They also treat fatigue, joint pain, skin rashes, and inflammation of the lungs in patients with lupus.  Corticosteroids are powerful hormones that rapidly suppress inflammation. The lowest dose with the highest benefit will be chosen. They can be given by cream, pills, injections, and through the vein (intravenously).  Immunosuppressive drugs block the making of immune cells. They may be used for kidney or nerve disease. HOME CARE INSTRUCTIONS  Exercise. Low-impact activities can usually help keep joints flexible without being too strenuous.  Rest after periods of exercise.  Avoid excessive sun exposure.  Follow proper nutrition and take supplements as recommended by your caregiver.  Stress management can be helpful. SEEK MEDICAL CARE IF:  You have increased fatigue.  You develop pain.  You develop a rash.  You have an oral temperature above 102 F (38.9 C).  You develop abdominal discomfort.  You develop a headache.  You experience dizziness. FOR MORE INFORMATION National Institute of Neurological Disorders and Stroke: www.ninds.nih.gov American College of Rheumatology: www.rheumatology.org National Institute of Arthritis and Musculoskeletal and Skin Diseases: www.niams.nih.gov Document Released: 01/22/2002 Document Revised: 04/26/2011 Document Reviewed: 05/15/2009 ExitCare Patient Information 2015 [employer]. This information is not intended to replace advice given to you by your health   care provider. Make sure  you discuss any questions you have with your health care provider.  

## 2013-11-19 NOTE — Progress Notes (Signed)
Patient ID: Jenna [name], female   DOB: [birth year], 58 y.o.   MRN: [id number]   Jenna [name], is a 58 y.o. female  [id number]  [id number]  DOB - 1955-06-17  Chief Complaint  Patient presents with  . Follow-up        Subjective:   Jenna [name] is a 57 y.o. female here today for a follow up visit. Patient with history of systemic lupus erythematosus, chronic pain syndrome, hypertension and morbid obesity here today for routine follow-up of chronic pain. Patient is complaining of leg pain, knee pain and low back pain. Patient's blood pressure has not been controlled. Patient is on hydroxychloroquine for SLE, has not had eye examination. Patient is currently on Vasotec 5 mg tablet by mouth daily for high blood pressure. Patient claims compliant with medications. Patient has No headache, No chest pain, No abdominal pain - No Nausea, No new weakness tingling or numbness, No Cough - SOB.  Problem  Encounter for Immunization    ALLERGIES: Allergies  Allergen Reactions  . Penicillins Other (See Comments)    Light headed    PAST MEDICAL HISTORY: Past Medical History  Diagnosis Date  . Lupus (systemic lupus erythematosus)   . Obesity   . Renal stone     MEDICATIONS AT HOME: Prior to Admission medications   Medication Sig Start Date End Date Taking? Authorizing Provider  acetaminophen (TYLENOL) 500 MG tablet Take 1,000 mg by mouth 3 (three) times daily as needed (pain).   Yes Historical Provider, MD  acetaminophen-codeine (TYLENOL #3) 300-30 MG per tablet Take 1 tablet by mouth every 8 (eight) hours as needed for moderate pain. 11/19/13  Yes Tresa Garter, MD  enalapril (VASOTEC) 10 MG tablet Take 1 tablet (10 mg total) by mouth daily. 11/19/13  Yes Tresa Garter, MD  HYDROcodone-acetaminophen (NORCO/VICODIN) 5-325 MG per tablet Take 1 tablet by mouth every 6 (six) hours as needed for moderate pain.   Yes Historical Provider, MD  hydroxychloroquine (PLAQUENIL)  200 MG tablet Take 1 tablet (200 mg total) by mouth 2 (two) times daily. 11/19/13  Yes Tresa Garter, MD  Blood Pressure KIT Check Blood Pressure Daily 11/19/13   Tresa Garter, MD  guaiFENesin (ROBITUSSIN) 100 MG/5ML liquid Take 5-10 mLs (100-200 mg total) by mouth every 4 (four) hours as needed for cough. 08/10/13   Domenic Moras, PA-C     Objective:   Filed Vitals:   11/19/13 1608  BP: 156/104  Pulse: 82  Temp: 98.8 F (37.1 C)  TempSrc: Oral  Resp: 16  Height: 5' 7.5" (1.715 m)  Weight: 214 lb (97.07 kg)  SpO2: 97%    Exam General appearance : Awake, alert, not in any distress. Speech Clear. Not toxic looking HEENT: Atraumatic and Normocephalic, pupils equally reactive to light and accomodation Neck: supple, no JVD. No cervical lymphadenopathy.  Chest:Good air entry bilaterally, no added sounds  CVS: S1 S2 regular, no murmurs.  Abdomen: Bowel sounds present, Non tender and not distended with no gaurding, rigidity or rebound. Extremities: B/L Lower Ext shows no edema, both legs are warm to touch Neurology: Awake alert, and oriented X 3, CN II-XII intact, Non focal  Data Review Lab Results  Component Value Date   HGBA1C 5.9* 05/05/2013     Assessment & Plan   1. Encounter for immunization Flu vaccine is given.  2. Lupus (systemic lupus erythematosus)  - hydroxychloroquine (PLAQUENIL) 200 MG tablet; Take 1 tablet (200 mg total) by mouth 2 (two) times  daily.  Dispense: 60 tablet; Refill: 6 - acetaminophen-codeine (TYLENOL #3) 300-30 MG per tablet; Take 1 tablet by mouth every 8 (eight) hours as needed for moderate pain.  Dispense: 90 tablet; Refill: 0 - Ambulatory referral to Ophthalmology  3. CKD (chronic kidney disease) stage 4, GFR 15-29 ml/min Follow-up with nephrologist.  4. Essential hypertension  - Blood Pressure KIT; Check Blood Pressure Daily  Dispense: 1 each; Refill: 0 Increase - enalapril (VASOTEC) 10 MG tablet; Take 1 tablet (10 mg total) by  mouth daily.  Dispense: 90 tablet; Refill: 3  - We have discussed target BP range and blood pressure goal - I have advised patient to check BP regularly and to call us back or report to clinic if the numbers are consistently higher than 140/90  - We discussed the importance of compliance with medical therapy and DASH diet recommended, consequences of uncontrolled hypertension discussed.  - continue current BP medications  Return in about 3 months (around 02/19/2014), or if symptoms worsen or fail to improve, for Follow up HTN, Lupus, CKD/ESRD.  The patient was given clear instructions to go to ER or return to medical center if symptoms don't improve, worsen or new problems develop. The patient verbalized understanding. The patient was told to call to get lab results if they haven't heard anything in the next week.   This note has been created with Surveyor, quantity. Any transcriptional errors are unintentional.    Angelica Chessman, MD, [place], [place], [place] and [place], [place]   11/19/2013, 5:08 PM

## 2013-12-03 ENCOUNTER — Emergency Department (HOSPITAL_COMMUNITY): Payer: Medicaid Other

## 2013-12-03 ENCOUNTER — Encounter (HOSPITAL_COMMUNITY): Payer: Self-pay | Admitting: Emergency Medicine

## 2013-12-03 ENCOUNTER — Emergency Department (HOSPITAL_COMMUNITY)
Admission: EM | Admit: 2013-12-03 | Discharge: 2013-12-03 | Disposition: A | Discharge status: Home / Self Care | Payer: Medicaid Other | Attending: Emergency Medicine | Admitting: Emergency Medicine

## 2013-12-03 ENCOUNTER — Telehealth: Payer: Self-pay | Admitting: Internal Medicine

## 2013-12-03 DIAGNOSIS — Z79899 Other long term (current) drug therapy: Secondary | ICD-10-CM | POA: Diagnosis not present

## 2013-12-03 DIAGNOSIS — Z8739 Personal history of other diseases of the musculoskeletal system and connective tissue: Secondary | ICD-10-CM | POA: Diagnosis not present

## 2013-12-03 DIAGNOSIS — R059 Cough, unspecified: Secondary | ICD-10-CM

## 2013-12-03 DIAGNOSIS — Z87442 Personal history of urinary calculi: Secondary | ICD-10-CM | POA: Diagnosis not present

## 2013-12-03 DIAGNOSIS — J069 Acute upper respiratory infection, unspecified: Secondary | ICD-10-CM | POA: Diagnosis not present

## 2013-12-03 DIAGNOSIS — R05 Cough: Secondary | ICD-10-CM

## 2013-12-03 DIAGNOSIS — E669 Obesity, unspecified: Secondary | ICD-10-CM | POA: Insufficient documentation

## 2013-12-03 DIAGNOSIS — Z880 Allergy status to penicillin: Secondary | ICD-10-CM | POA: Insufficient documentation

## 2013-12-03 MED ORDER — GUAIFENESIN ER 1200 MG PO TB12: 1.0000 | ORAL_TABLET | Freq: Two times a day (BID) | ORAL | Status: DC

## 2013-12-03 MED ORDER — IBUPROFEN 800 MG PO TABS: 800.0000 mg | ORAL_TABLET | Freq: Three times a day (TID) | ORAL | Status: DC | PRN

## 2013-12-03 MED ORDER — ACETAMINOPHEN-CODEINE 120-12 MG/5ML PO SOLN: 10.0000 mL | ORAL | Status: DC | PRN

## 2013-12-03 NOTE — ED Notes (Signed)
Pt c/o non productive cough and feels like something could be caught on left side of throat x 2 days; no distress noted; pt sts some pain in her right leg

## 2013-12-03 NOTE — Telephone Encounter (Signed)
Please f/u with other alternative  Patient thinks that Enalapril 10 mg is making her sick. Please follow up with Patient.

## 2013-12-03 NOTE — Discharge Instructions (Signed)
Return here as needed.  Followup with your primary care Dr. for further evaluation.  Increase your fluid intake, and rest as much possible

## 2013-12-03 NOTE — ED Notes (Signed)
Patient transported to X-ray 

## 2013-12-03 NOTE — ED Provider Notes (Signed)
CSN: [id number]     Arrival date & time 12/03/13  1303 History   First MD Initiated Contact with Patient 12/03/13 1541     Chief Complaint  Patient presents with  . Cough     (Consider location/radiation/quality/duration/timing/severity/associated sxs/prior Treatment) HPI  Patient presents to the emergency department with nonproductive cough and sensation of swelling to the left side of her throat x2 days.  The patient, states, that she has a glass of her throat is closing off, and she sometimes take a deep breath to help with the, sensation.  The patient, states, that nothing seems make her condition, better or worse.  The patient, states, that she called her primary care Dr. and advised to come to the emergency room for further evaluation.  Patient, states, that she's not had any chest pain, nausea, vomiting, weakness, dizziness, headache, blurred vision, rash, or syncope.  Patient, states, that she's not had a sore throat, or nasal congestion.  Patient has had some runny nose.  The patient, states, that she was concerned about her blood pressure.  Medications was causing this sensation. Past Medical History  Diagnosis Date  . Lupus (systemic lupus erythematosus)   . Obesity   . Renal stone    History reviewed. No pertinent past surgical history. Family History  Problem Relation Age of Onset  . Diabetes Mother   . Hypertension Father   . Cancer Sister   . Hypertension Brother    History  Substance Use Topics  . Smoking status: Never Smoker   . Smokeless tobacco: Never Used  . Alcohol Use: No   OB History   Grav Para Term Preterm Abortions TAB SAB Ect Mult Living                 Review of Systems All other systems negative except as documented in the HPI. All pertinent positives and negatives as reviewed in the HPI.  Allergies  Penicillins  Home Medications   Prior to Admission medications   Medication Sig Start Date End Date Taking? Authorizing Provider   acetaminophen (TYLENOL) 500 MG tablet Take 1,000 mg by mouth 3 (three) times daily as needed (pain).    Historical Provider, MD  acetaminophen-codeine (TYLENOL #3) 300-30 MG per tablet Take 1 tablet by mouth every 8 (eight) hours as needed for moderate pain. 11/19/13   Tresa Garter, MD  Blood Pressure KIT Check Blood Pressure Daily 11/19/13   Tresa Garter, MD  enalapril (VASOTEC) 10 MG tablet Take 1 tablet (10 mg total) by mouth daily. 11/19/13   Tresa Garter, MD  guaiFENesin (ROBITUSSIN) 100 MG/5ML liquid Take 5-10 mLs (100-200 mg total) by mouth every 4 (four) hours as needed for cough. 08/10/13   Domenic Moras, PA-C  HYDROcodone-acetaminophen (NORCO/VICODIN) 5-325 MG per tablet Take 1 tablet by mouth every 6 (six) hours as needed for moderate pain.    Historical Provider, MD  hydroxychloroquine (PLAQUENIL) 200 MG tablet Take 1 tablet (200 mg total) by mouth 2 (two) times daily. 11/19/13   Tresa Garter, MD   BP 139/73  Pulse 72  Temp(Src) 98.5 F (36.9 C) (Oral)  Resp 18  Ht 5' 7.5" (1.715 m)  Wt 210 lb (95.255 kg)  BMI 32.39 kg/m2  SpO2 100% Physical Exam  Nursing note and vitals reviewed. Constitutional: She is oriented to person, place, and time. She appears well-developed and well-nourished. No distress.  HENT:  Head: Normocephalic and atraumatic.  Mouth/Throat: Oropharynx is clear and moist.  Eyes: Pupils  are equal, round, and reactive to light.  Neck: Normal range of motion. Neck supple. No tracheal deviation present. No thyromegaly present.  Cardiovascular: Normal rate, regular rhythm and normal heart sounds.  Exam reveals no gallop and no friction rub.   No murmur heard. Pulmonary/Chest: Effort normal and breath sounds normal. No stridor. No respiratory distress. She has no wheezes. She has no rales.  Neurological: She is alert and oriented to person, place, and time. She exhibits normal muscle tone. Coordination normal.  Skin: Skin is warm and dry.     ED Course  Procedures (including critical care time) Labs Review Labs Reviewed - No data to display  Imaging Review Dg Neck Soft Tissue  12/03/2013   CLINICAL DATA:  Right side neck pain.  Cough for 2 days.  EXAM: NECK SOFT TISSUES - 1+ VIEW  COMPARISON:  None.  FINDINGS: There is no evidence of retropharyngeal soft tissue swelling or epiglottic enlargement. The cervical airway is unremarkable and no radio-opaque foreign body is identified.  IMPRESSION: Negative exam.   Electronically Signed   By: Inge Rise M.D.   On: 12/03/2013 15:01   Dg Chest 2 View  12/03/2013   CLINICAL DATA:  Initial encounter for 2 day history of dry cough and right-sided neck pain.  EXAM: CHEST  2 VIEW  COMPARISON:  08/10/2013.  FINDINGS: Stable asymmetric elevation of the right hemidiaphragm with chronic subsegmental atelectasis or linear scarring at the right base. Lungs are clear without edema, pneumothorax, focal consolidation, or pleural effusion. The cardiopericardial silhouette is within normal limits for size. Imaged bony structures of the thorax are intact.  IMPRESSION: Stable.  No acute cardiopulmonary findings.   Electronically Signed   By: Misty Stanley M.D.   On: 12/03/2013 15:02     Patient does not have any signs of airway occlusion noted on exam.  Patient is talking and breathing normally.  The patient has  Lymphadenopathy, along the anterior cervical chain on the left and this may be causing a sensation of swelling of her throat.  Patient has no difficulty swallowing, and handling her secretions.  Patient be referred back to her primary care Dr. told to return here as needed.  For any worsening in her condition.  Will advise patient to increase her fluid intake, and rest as much possible  Brent General, PA-C 12/03/13 1633

## 2013-12-03 NOTE — Telephone Encounter (Signed)
Patient thinks that Enalapril 10 mg is making her sick. Please follow up with Patient.

## 2013-12-04 NOTE — ED Provider Notes (Signed)
Medical screening examination/treatment/procedure(s) were performed by non-physician practitioner and as supervising physician I was immediately available for consultation/collaboration.   EKG Interpretation None       Jasper Riling. Alvino Chapel, MD 12/04/13 1500

## 2013-12-05 ENCOUNTER — Telehealth: Payer: Self-pay | Admitting: Internal Medicine

## 2013-12-05 NOTE — Telephone Encounter (Signed)
Patient was at the Department Emergency with cough and upper respiration infection. Patient was given a prescription for Guaifenesin 1200 MG Tb12 but Patient lost it. Patient request a copy of the prescription. Also, Patient requests refill for hydrocodon-acetaminophen 5-325. Please follow up with Patient.

## 2013-12-06 ENCOUNTER — Telehealth: Payer: Self-pay | Admitting: *Deleted

## 2013-12-06 NOTE — Telephone Encounter (Signed)
Pt stated that she did find her rx.  I informed her that we did not write rx for narcotics. She said that she had an appointment next week and she would ask her then. I told her to fill the medication for her bronchitis so she could start feeling better.

## 2013-12-10 ENCOUNTER — Ambulatory Visit: Payer: Self-pay

## 2013-12-18 ENCOUNTER — Other Ambulatory Visit: Payer: Self-pay | Admitting: Internal Medicine

## 2013-12-18 ENCOUNTER — Other Ambulatory Visit (HOSPITAL_COMMUNITY): Payer: Self-pay | Admitting: Internal Medicine

## 2013-12-18 ENCOUNTER — Other Ambulatory Visit: Payer: Medicaid Other

## 2013-12-18 ENCOUNTER — Ambulatory Visit (HOSPITAL_COMMUNITY)
Admission: RE | Admit: 2013-12-18 | Discharge: 2013-12-18 | Disposition: A | Discharge status: Home / Self Care | Payer: Medicaid Other | Source: Ambulatory Visit | Attending: Vascular Surgery | Admitting: Vascular Surgery

## 2013-12-18 DIAGNOSIS — R52 Pain, unspecified: Secondary | ICD-10-CM

## 2013-12-18 DIAGNOSIS — M7989 Other specified soft tissue disorders: Secondary | ICD-10-CM | POA: Diagnosis present

## 2013-12-18 DIAGNOSIS — M79604 Pain in right leg: Secondary | ICD-10-CM | POA: Insufficient documentation

## 2013-12-18 DIAGNOSIS — R609 Edema, unspecified: Secondary | ICD-10-CM

## 2013-12-18 NOTE — Progress Notes (Signed)
VASCULAR LAB PRELIMINARY  PRELIMINARY  PRELIMINARY  PRELIMINARY  Right lower extremity venous duplex completed.    Preliminary report:  Right:  No evidence of DVT, superficial thrombosis, or Baker's cyst.  Dlynn Ranes, RVS 12/18/2013, 11:37 AM

## 2013-12-25 ENCOUNTER — Ambulatory Visit: Payer: Medicaid Other | Attending: Internal Medicine | Admitting: Internal Medicine

## 2013-12-25 ENCOUNTER — Encounter: Payer: Self-pay | Admitting: Internal Medicine

## 2013-12-25 VITALS — BP 162/94 | HR 77 | Temp 98.3°F | Resp 16 | Ht 68.4 in | Wt 218.4 lb

## 2013-12-25 DIAGNOSIS — I10 Essential (primary) hypertension: Secondary | ICD-10-CM

## 2013-12-25 DIAGNOSIS — Z1231 Encounter for screening mammogram for malignant neoplasm of breast: Secondary | ICD-10-CM

## 2013-12-25 DIAGNOSIS — M329 Systemic lupus erythematosus, unspecified: Secondary | ICD-10-CM

## 2013-12-25 MED ORDER — ASPIRIN 81 MG PO TABS: 81.0000 mg | ORAL_TABLET | Freq: Every day | ORAL | Status: DC

## 2013-12-25 MED ORDER — FUROSEMIDE 40 MG PO TABS: 40.0000 mg | ORAL_TABLET | Freq: Every day | ORAL | Status: DC

## 2013-12-25 MED ORDER — ENALAPRIL MALEATE 10 MG PO TABS: 10.0000 mg | ORAL_TABLET | Freq: Every day | ORAL | Status: DC

## 2013-12-25 MED ORDER — HYDROXYCHLOROQUINE SULFATE 200 MG PO TABS: 200.0000 mg | ORAL_TABLET | Freq: Two times a day (BID) | ORAL | Status: DC

## 2013-12-25 NOTE — Progress Notes (Signed)
Patient ID: Jenna [name], female   DOB: October 06, 1955, 58 y.o.   MRN: [id number]   Jenna [name], is a 58 y.o. female  [id number]  [id number]  DOB - February 14, 1956  Chief Complaint  Patient presents with  . Hypertension  . Knee Pain        Subjective:   Jenna [name] is a 58 y.o. female here today for a follow up visit. Pt is here for follow up on HTN, and lupus pain. C/o right knee pain x 2 days and states her pain is a 4 on scale of 1-10. Pt stated she has taken her BP medication this morning but BP was still elevated. No other concerns. Patient has No headache, No chest pain, No abdominal pain - No Nausea, No new weakness tingling or numbness, No Cough - SOB.  No problems updated.  ALLERGIES: Allergies  Allergen Reactions  . Penicillins Other (See Comments)    Light headed    PAST MEDICAL HISTORY: Past Medical History  Diagnosis Date  . Lupus (systemic lupus erythematosus)   . Obesity   . Renal stone     MEDICATIONS AT HOME: Prior to Admission medications   Medication Sig Start Date End Date Taking? Authorizing Provider  acetaminophen-codeine (TYLENOL #3) 300-30 MG per tablet Take 1 tablet by mouth every 8 (eight) hours as needed for moderate pain. 11/19/13  Yes Tresa Garter, MD  acetaminophen-codeine 120-12 MG/5ML solution Take 10 mLs by mouth every 4 (four) hours as needed (for cough). 12/03/13  Yes Resa Miner Lawyer, PA-C  aspirin 81 MG tablet Take 1 tablet (81 mg total) by mouth daily. 12/25/13  Yes Tresa Garter, MD  calcium carbonate (TUMS - DOSED IN MG ELEMENTAL CALCIUM) 500 MG chewable tablet Chew 1 tablet by mouth daily as needed for indigestion or heartburn.   Yes Historical Provider, MD  enalapril (VASOTEC) 10 MG tablet Take 1 tablet (10 mg total) by mouth daily. 12/25/13  Yes Tresa Garter, MD  furosemide (LASIX) 40 MG tablet Take 1 tablet (40 mg total) by mouth daily. 12/25/13  Yes Tresa Garter, MD    HYDROcodone-acetaminophen (NORCO/VICODIN) 5-325 MG per tablet Take 1 tablet by mouth every 6 (six) hours as needed for moderate pain.   Yes Historical Provider, MD  hydroxychloroquine (PLAQUENIL) 200 MG tablet Take 1 tablet (200 mg total) by mouth 2 (two) times daily. 12/25/13  Yes Tresa Garter, MD  Blood Pressure KIT Check Blood Pressure Daily 11/19/13   Tresa Garter, MD  Guaifenesin 1200 MG TB12 Take 1 tablet (1,200 mg total) by mouth 2 (two) times daily. 12/03/13   Brent General, PA-C     Objective:   Filed Vitals:   12/25/13 0932  BP: 162/94  Pulse: 77  Temp: 98.3 F (36.8 C)  Resp: 16  Height: 5' 8.4" (1.737 m)  Weight: 218 lb 6.4 oz (99.066 kg)  SpO2: 100%    Exam General appearance : Awake, alert, not in any distress. Speech Clear. Not toxic looking HEENT: Atraumatic and Normocephalic, pupils equally reactive to light and accomodation Neck: supple, no JVD. No cervical lymphadenopathy.  Chest:Good air entry bilaterally, no added sounds  CVS: S1 S2 regular, no murmurs.  Abdomen: Bowel sounds present, Non tender and not distended with no gaurding, rigidity or rebound. Extremities: B/L Lower Ext shows no edema, both legs are warm to touch Neurology: Awake alert, and oriented X 3, CN II-XII intact, Non focal  Data Review Lab Results  Component Value  Date   HGBA1C 5.9* 05/05/2013     Assessment & Plan   1. Lupus (systemic lupus erythematosus)  - hydroxychloroquine (PLAQUENIL) 200 MG tablet; Take 1 tablet (200 mg total) by mouth 2 (two) times daily.  Dispense: 180 tablet; Refill: 3  2. Encounter for screening mammogram for breast cancer  - MM Digital Screening; Future  3. Essential hypertension  - furosemide (LASIX) 40 MG tablet; Take 1 tablet (40 mg total) by mouth daily.  Dispense: 30 tablet; Refill: 3 - enalapril (VASOTEC) 10 MG tablet; Take 1 tablet (10 mg total) by mouth daily.  Dispense: 90 tablet; Refill: 3 - aspirin 81 MG tablet; Take  1 tablet (81 mg total) by mouth daily.  Dispense: 90 tablet; Refill: 3  - We have discussed target BP range and blood pressure goal - I have advised patient to check BP regularly and to call us back or report to clinic if the numbers are consistently higher than 140/90  - We discussed the importance of compliance with medical therapy and DASH diet recommended, consequences of uncontrolled hypertension discussed.  - continue current BP medications   Return in about 3 months (around 03/27/2014), or if symptoms worsen or fail to improve, for Follow up HTN, Follow up Pain and comorbidities.  The patient was given clear instructions to go to ER or return to medical center if symptoms don't improve, worsen or new problems develop. The patient verbalized understanding. The patient was told to call to get lab results if they haven't heard anything in the next week.   This note has been created with Surveyor, quantity. Any transcriptional errors are unintentional.    Angelica Chessman, MD, [place], [place], [place] and [place], [place]   12/25/2013, 10:09 AM

## 2013-12-25 NOTE — Progress Notes (Signed)
Pt is here for follow up on HTN, and lupus pain.  C/o right knee pain x 2 days and states her pain is a 4 on scale of 1-10. Pt stated she has taken her BP medication this morning but BP was still elevated.  No other concerns.

## 2013-12-25 NOTE — Patient Instructions (Signed)
Lupus Lupus (also called systemic lupus erythematosus, SLE) is a disorder of the body's natural defense system (immune system). In lupus, the immune system attacks various areas of the body (autoimmune disease). CAUSES The cause is unknown. However, lupus runs in families. Certain genes can make you more likely to develop lupus. It is 10 times more common in women than in men. Lupus is also more common in African Americans and Asians. Other factors also play a role, such as viruses (Epstein-Barr virus, EBV), stress, hormones, cigarette smoke, and certain drugs. SYMPTOMS Lupus can affect many parts of the body, including the joints, skin, kidneys, lungs, heart, nervous system, and blood vessels. The signs and symptoms of lupus differ from person to person. The disease can range from mild to life-threatening. Typical features of lupus include:  Butterfly-shaped rash over the face.  Arthritis involving one or more joints.  Kidney disease.  Fever, weight loss, hair loss, fatigue.  Poor circulation in the fingers and toes (Raynaud's disease).  Chest pain when taking deep breaths. Abdominal pain may also occur.  Skin rash in areas exposed to the sun.  Sores in the mouth and nose. DIAGNOSIS Diagnosing lupus can take a long time and is often difficult. An exam and an accurate account of your symptoms and health problems is very important. Blood tests are necessary, though no single test can confirm or rule out lupus. Most people with lupus test positive for antinuclear antibodies (ANA) on a blood test. Additional blood tests, a urine test (urinalysis), and sometimes a kidney or skin tissue sample (biopsy) can help to confirm or rule out lupus. TREATMENT There is no cure for lupus. Your caregiver will develop a treatment plan based on your age, sex, health, symptoms, and lifestyle. The goals are to prevent flares, to treat them when they do occur, and to minimize organ damage and complications. How  the disease may affect each person varies widely. Most people with lupus can live normal lives, but this disorder must be carefully monitored. Treatment must be adjusted as necessary to prevent serious complications. Medicines used for treatment:  Nonsteroidal anti-inflammatory drugs (NSAIDs) decrease inflammation and can help with chest pain, joint pain, and fevers. Examples include ibuprofen and naproxen.  Antimalarial drugs were designed to treat malaria. They also treat fatigue, joint pain, skin rashes, and inflammation of the lungs in patients with lupus.  Corticosteroids are powerful hormones that rapidly suppress inflammation. The lowest dose with the highest benefit will be chosen. They can be given by cream, pills, injections, and through the vein (intravenously).  Immunosuppressive drugs block the making of immune cells. They may be used for kidney or nerve disease. HOME CARE INSTRUCTIONS  Exercise. Low-impact activities can usually help keep joints flexible without being too strenuous.  Rest after periods of exercise.  Avoid excessive sun exposure.  Follow proper nutrition and take supplements as recommended by your caregiver.  Stress management can be helpful. SEEK MEDICAL CARE IF:  You have increased fatigue.  You develop pain.  You develop a rash.  You have an oral temperature above 102 F (38.9 C).  You develop abdominal discomfort.  You develop a headache.  You experience dizziness. FOR MORE INFORMATION National Institute of Neurological Disorders and Stroke: www.ninds.nih.gov American College of Rheumatology: www.rheumatology.org National Institute of Arthritis and Musculoskeletal and Skin Diseases: www.niams.nih.gov Document Released: 01/22/2002 Document Revised: 04/26/2011 Document Reviewed: 05/15/2009 ExitCare Patient Information 2015 [employer]. This information is not intended to replace advice given to you by your health   care provider. Make sure  you discuss any questions you have with your health care provider.  

## 2014-01-29 ENCOUNTER — Ambulatory Visit (HOSPITAL_COMMUNITY): Payer: Medicaid Other | Attending: Internal Medicine

## 2014-01-30 ENCOUNTER — Other Ambulatory Visit: Payer: Self-pay | Admitting: Radiology

## 2014-03-12 ENCOUNTER — Ambulatory Visit: Payer: Medicaid Other | Admitting: Internal Medicine

## 2014-04-12 ENCOUNTER — Ambulatory Visit: Payer: Medicaid Other | Attending: Internal Medicine

## 2014-04-12 ENCOUNTER — Telehealth: Payer: Self-pay

## 2014-04-12 NOTE — Progress Notes (Unsigned)
Pt comes in today for blood pressure recheck with c/o throb headache that started sudden today States per last visit, she was taken off BP medication by provider. Progress note not noted for d/c of bp medication Pt was instructed to take Enalapril 10 mg for BP treatment BP- 154/83 80 Pt will need to schedule OV for f/u visit  Will provide health literature DASH diet, Hypertension  Declined Flu/Pna vaccine

## 2014-04-12 NOTE — Patient Instructions (Signed)
Hypertension Hypertension, commonly called high blood pressure, is when the force of blood pumping through your arteries is too strong. Your arteries are the blood vessels that carry blood from your heart throughout your body. A blood pressure reading consists of a higher number over a lower number, such as 110/72. The higher number (systolic) is the pressure inside your arteries when your heart pumps. The lower number (diastolic) is the pressure inside your arteries when your heart relaxes. Ideally you want your blood pressure below 120/80. Hypertension forces your heart to work harder to pump blood. Your arteries may become narrow or stiff. Having hypertension puts you at risk for heart disease, stroke, and other problems.  RISK FACTORS Some risk factors for high blood pressure are controllable. Others are not.  Risk factors you cannot control include:   Race. You may be at higher risk if you are African American.  Age. Risk increases with age.  Gender. Men are at higher risk than women before age 45 years. After age 65, women are at higher risk than men. Risk factors you can control include:  Not getting enough exercise or physical activity.  Being overweight.  Getting too much fat, sugar, calories, or salt in your diet.  Drinking too much alcohol. SIGNS AND SYMPTOMS Hypertension does not usually cause signs or symptoms. Extremely high blood pressure (hypertensive crisis) may cause headache, anxiety, shortness of breath, and nosebleed. DIAGNOSIS  To check if you have hypertension, your health care provider will measure your blood pressure while you are seated, with your arm held at the level of your heart. It should be measured at least twice using the same arm. Certain conditions can cause a difference in blood pressure between your right and left arms. A blood pressure reading that is higher than normal on one occasion does not mean that you need treatment. If one blood pressure reading  is high, ask your health care provider about having it checked again. TREATMENT  Treating high blood pressure includes making lifestyle changes and possibly taking medicine. Living a healthy lifestyle can help lower high blood pressure. You may need to change some of your habits. Lifestyle changes may include:  Following the DASH diet. This diet is high in fruits, vegetables, and whole grains. It is low in salt, red meat, and added sugars.  Getting at least 2 hours of brisk physical activity every week.  Losing weight if necessary.  Not smoking.  Limiting alcoholic beverages.  Learning ways to reduce stress. If lifestyle changes are not enough to get your blood pressure under control, your health care provider may prescribe medicine. You may need to take more than one. Work closely with your health care provider to understand the risks and benefits. HOME CARE INSTRUCTIONS  Have your blood pressure rechecked as directed by your health care provider.   Take medicines only as directed by your health care provider. Follow the directions carefully. Blood pressure medicines must be taken as prescribed. The medicine does not work as well when you skip doses. Skipping doses also puts you at risk for problems.   Do not smoke.   Monitor your blood pressure at home as directed by your health care provider. SEEK MEDICAL CARE IF:   You think you are having a reaction to medicines taken.  You have recurrent headaches or feel dizzy.  You have swelling in your ankles.  You have trouble with your vision. SEEK IMMEDIATE MEDICAL CARE IF:  You develop a severe headache or confusion.    You have unusual weakness, numbness, or feel faint.  You have severe chest or abdominal pain.  You vomit repeatedly.  You have trouble breathing. MAKE SURE YOU:   Understand these instructions.  Will watch your condition.  Will get help right away if you are not doing well or get worse. Document  Released: 02/01/2005 Document Revised: 06/18/2013 Document Reviewed: 11/24/2012 Exit[employer] Patient Information 2015 [employer]. This information is not intended to replace advice given to you by your health care provider. Make sure you discuss any questions you have with your health care provider. DASH Eating Plan DASH stands for "Dietary Approaches to Stop Hypertension." The DASH eating plan is a healthy eating plan that has been shown to reduce high blood pressure (hypertension). Additional health benefits may include reducing the risk of type 2 diabetes mellitus, heart disease, and stroke. The DASH eating plan may also help with weight loss. WHAT DO I NEED TO KNOW ABOUT THE DASH EATING PLAN? For the DASH eating plan, you will follow these general guidelines:  Choose foods with a percent daily value for sodium of less than 5% (as listed on the food label).  Use salt-free seasonings or herbs instead of table salt or sea salt.  Check with your health care provider or pharmacist before using salt substitutes.  Eat lower-sodium products, often labeled as "lower sodium" or "no salt added."  Eat fresh foods.  Eat more vegetables, fruits, and low-fat dairy products.  Choose whole grains. Look for the word "whole" as the first word in the ingredient list.  Choose fish and skinless chicken or turkey more often than red meat. Limit fish, poultry, and meat to 6 oz (170 g) each day.  Limit sweets, desserts, sugars, and sugary drinks.  Choose heart-healthy fats.  Limit cheese to 1 oz (28 g) per day.  Eat more home-cooked food and less restaurant, buffet, and fast food.  Limit fried foods.  Cook foods using methods other than frying.  Limit canned vegetables. If you do use them, rinse them well to decrease the sodium.  When eating at a restaurant, ask that your food be prepared with less salt, or no salt if possible. WHAT FOODS CAN I EAT? Seek help from a dietitian for individual  calorie needs. Grains Whole grain or whole wheat bread. Brown rice. Whole grain or whole wheat pasta. Quinoa, bulgur, and whole grain cereals. Low-sodium cereals. Corn or whole wheat flour tortillas. Whole grain cornbread. Whole grain crackers. Low-sodium crackers. Vegetables Fresh or frozen vegetables (raw, steamed, roasted, or grilled). Low-sodium or reduced-sodium tomato and vegetable juices. Low-sodium or reduced-sodium tomato sauce and paste. Low-sodium or reduced-sodium canned vegetables.  Fruits All fresh, canned (in natural juice), or frozen fruits. Meat and Other Protein Products Ground beef (85% or leaner), grass-fed beef, or beef trimmed of fat. Skinless chicken or turkey. Ground chicken or turkey. Pork trimmed of fat. All fish and seafood. Eggs. Dried beans, peas, or lentils. Unsalted nuts and seeds. Unsalted canned beans. Dairy Low-fat dairy products, such as skim or 1% milk, 2% or reduced-fat cheeses, low-fat ricotta or cottage cheese, or plain low-fat yogurt. Low-sodium or reduced-sodium cheeses. Fats and Oils Tub margarines without trans fats. Light or reduced-fat mayonnaise and salad dressings (reduced sodium). Avocado. Safflower, olive, or canola oils. Natural peanut or almond butter. Other Unsalted popcorn and pretzels. The items listed above may not be a complete list of recommended foods or beverages. Contact your dietitian for more options. WHAT FOODS ARE NOT RECOMMENDED? Grains White bread.   White pasta. White rice. Refined cornbread. Bagels and croissants. Crackers that contain trans fat. Vegetables Creamed or fried vegetables. Vegetables in a cheese sauce. Regular canned vegetables. Regular canned tomato sauce and paste. Regular tomato and vegetable juices. Fruits Dried fruits. Canned fruit in light or heavy syrup. Fruit juice. Meat and Other Protein Products Fatty cuts of meat. Ribs, chicken wings, bacon, sausage, bologna, salami, chitterlings, fatback, hot dogs,  bratwurst, and packaged luncheon meats. Salted nuts and seeds. Canned beans with salt. Dairy Whole or 2% milk, cream, half-and-half, and cream cheese. Whole-fat or sweetened yogurt. Full-fat cheeses or blue cheese. Nondairy creamers and whipped toppings. Processed cheese, cheese spreads, or cheese curds. Condiments Onion and garlic salt, seasoned salt, table salt, and sea salt. Canned and packaged gravies. Worcestershire sauce. Tartar sauce. Barbecue sauce. Teriyaki sauce. Soy sauce, including reduced sodium. Steak sauce. Fish sauce. Oyster sauce. Cocktail sauce. Horseradish. Ketchup and mustard. Meat flavorings and tenderizers. Bouillon cubes. Hot sauce. Tabasco sauce. Marinades. Taco seasonings. Relishes. Fats and Oils Butter, stick margarine, lard, shortening, ghee, and bacon fat. Coconut, palm kernel, or palm oils. Regular salad dressings. Other Pickles and olives. Salted popcorn and pretzels. The items listed above may not be a complete list of foods and beverages to avoid. Contact your dietitian for more information. WHERE CAN I FIND MORE INFORMATION? National Heart, Lung, and Blood Institute: www.nhlbi.nih.gov/health/health-topics/topics/dash/ Document Released: 01/21/2011 Document Revised: 06/18/2013 Document Reviewed: 12/06/2012 ExitCare Patient Information 2015 [employer]. This information is not intended to replace advice given to you by your health care provider. Make sure you discuss any questions you have with your health care provider.  

## 2014-04-12 NOTE — Telephone Encounter (Signed)
Patient called this am stating her blood pressure was elevated and stated her Doctor had taken her off her blood pressure medication Patient's recorded blood pressure this am was 190/119 Instructed patient to come in this afternoon to be triaged by the triage nurse and if needed  We will refill her medications

## 2014-04-16 ENCOUNTER — Ambulatory Visit: Payer: Medicaid Other | Attending: Internal Medicine | Admitting: Family Medicine

## 2014-04-16 ENCOUNTER — Encounter: Payer: Self-pay | Admitting: Family Medicine

## 2014-04-16 VITALS — BP 160/90 | HR 74 | Temp 98.8°F | Resp 16 | Wt 229.2 lb

## 2014-04-16 DIAGNOSIS — J029 Acute pharyngitis, unspecified: Secondary | ICD-10-CM | POA: Diagnosis not present

## 2014-04-16 DIAGNOSIS — I10 Essential (primary) hypertension: Secondary | ICD-10-CM | POA: Diagnosis not present

## 2014-04-16 DIAGNOSIS — IMO0001 Reserved for inherently not codable concepts without codable children: Secondary | ICD-10-CM

## 2014-04-16 DIAGNOSIS — R030 Elevated blood-pressure reading, without diagnosis of hypertension: Secondary | ICD-10-CM

## 2014-04-16 DIAGNOSIS — I129 Hypertensive chronic kidney disease with stage 1 through stage 4 chronic kidney disease, or unspecified chronic kidney disease: Secondary | ICD-10-CM | POA: Insufficient documentation

## 2014-04-16 LAB — POCT RAPID STREP A (OFFICE): Rapid Strep A Screen: NEGATIVE

## 2014-04-16 MED ORDER — CLONIDINE HCL 0.1 MG PO TABS
0.2000 mg | ORAL_TABLET | Freq: Once | ORAL | Status: AC
  Administered 2014-04-16: 0.2 mg via ORAL

## 2014-04-16 NOTE — Assessment & Plan Note (Signed)
Neck is supple FROM w/o adenopathy or tenderness. Lungs are clear to auscultation.HS are regular. She is warm and dry, in no distress.

## 2014-04-16 NOTE — Progress Notes (Signed)
Patient complains of elevated  Blood pressure Complain of having headache congestion sore throat and sinus pressure As well

## 2014-04-16 NOTE — Patient Instructions (Addendum)
Take enalapril one twice a day. Return in two weeks for BP check with primary physician

## 2014-04-17 LAB — CULTURE, GROUP A STREP: Organism ID, Bacteria: NORMAL

## 2014-04-26 ENCOUNTER — Encounter: Payer: Self-pay | Admitting: Emergency Medicine

## 2014-04-26 ENCOUNTER — Telehealth: Payer: Self-pay | Admitting: Internal Medicine

## 2014-04-26 ENCOUNTER — Other Ambulatory Visit: Payer: Self-pay | Admitting: Emergency Medicine

## 2014-04-26 DIAGNOSIS — I10 Essential (primary) hypertension: Secondary | ICD-10-CM

## 2014-04-26 MED ORDER — ENALAPRIL MALEATE 20 MG PO TABS: 20.0000 mg | ORAL_TABLET | Freq: Every day | ORAL | Status: DC

## 2014-04-26 NOTE — Progress Notes (Signed)
Patient ID: Jenna [name], female   DOB: Dec 29, 1955, 59 y.o.   MRN: [id number] Pt comes in today [place] fire [place] States she was told to come here for evaluation C/o frequent frontal headache with left facial numbness Sx's started yesterday. Denies chest pain or sob BP 173/96  Per Dr. Doreene Burke  Increase Enalapril to 20 mg tab daily And return in 1 week for nurse visit

## 2014-04-26 NOTE — Patient Instructions (Signed)
Take 2 tablets Enalapril medication starting tomorrow Return in 1 week for nurse visit to recheck blood pressure If you develop increase facial numbness with left arm pain,chest go to ER DASH Eating Plan DASH stands for "Dietary Approaches to Stop Hypertension." The DASH eating plan is a healthy eating plan that has been shown to reduce high blood pressure (hypertension). Additional health benefits may include reducing the risk of type 2 diabetes mellitus, heart disease, and stroke. The DASH eating plan may also help with weight loss. WHAT DO I NEED TO KNOW ABOUT THE DASH EATING PLAN? For the DASH eating plan, you will follow these general guidelines:  Choose foods with a percent daily value for sodium of less than 5% (as listed on the food label).  Use salt-free seasonings or herbs instead of table salt or sea salt.  Check with your health care provider or pharmacist before using salt substitutes.  Eat lower-sodium products, often labeled as "lower sodium" or "no salt added."  Eat fresh foods.  Eat more vegetables, fruits, and low-fat dairy products.  Choose whole grains. Look for the word "whole" as the first word in the ingredient list.  Choose fish and skinless chicken or [place] more often than red meat. Limit fish, poultry, and meat to 6 oz (170 g) each day.  Limit sweets, desserts, sugars, and sugary drinks.  Choose heart-healthy fats.  Limit cheese to 1 oz (28 g) per day.  Eat more home-cooked food and less restaurant, buffet, and fast food.  Limit fried foods.  Cook foods using methods other than frying.  Limit canned vegetables. If you do use them, rinse them well to decrease the sodium.  When eating at a restaurant, ask that your food be prepared with less salt, or no salt if possible. WHAT FOODS CAN I EAT? Seek help from a dietitian for individual calorie needs. Grains Whole grain or whole wheat bread. Brown rice. Whole grain or whole wheat pasta. Quinoa, bulgur,  and whole grain cereals. Low-sodium cereals. Corn or whole wheat flour tortillas. Whole grain cornbread. Whole grain crackers. Low-sodium crackers. Vegetables Fresh or frozen vegetables (raw, steamed, roasted, or grilled). Low-sodium or reduced-sodium tomato and vegetable juices. Low-sodium or reduced-sodium tomato sauce and paste. Low-sodium or reduced-sodium canned vegetables.  Fruits All fresh, canned (in natural juice), or frozen fruits. Meat and Other Protein Products Ground beef (85% or leaner), grass-fed beef, or beef trimmed of fat. Skinless chicken or [place]. Ground chicken or [place]. Pork trimmed of fat. All fish and seafood. Eggs. Dried beans, peas, or lentils. Unsalted nuts and seeds. Unsalted canned beans. Dairy Low-fat dairy products, such as skim or 1% milk, 2% or reduced-fat cheeses, low-fat ricotta or cottage cheese, or plain low-fat yogurt. Low-sodium or reduced-sodium cheeses. Fats and Oils Tub margarines without trans fats. Light or reduced-fat mayonnaise and salad dressings (reduced sodium). Avocado. Safflower, olive, or canola oils. Natural peanut or almond butter. Other Unsalted popcorn and pretzels. The items listed above may not be a complete list of recommended foods or beverages. Contact your dietitian for more options. WHAT FOODS ARE NOT RECOMMENDED? Grains White bread. White pasta. White rice. Refined cornbread. Bagels and croissants. Crackers that contain trans fat. Vegetables Creamed or fried vegetables. Vegetables in a cheese sauce. Regular canned vegetables. Regular canned tomato sauce and paste. Regular tomato and vegetable juices. Fruits Dried fruits. Canned fruit in light or heavy syrup. Fruit juice. Meat and Other Protein Products Fatty cuts of meat. Ribs, chicken wings, bacon, sausage, bologna, salami, chitterlings,  fatback, hot dogs, bratwurst, and packaged luncheon meats. Salted nuts and seeds. Canned beans with salt. Dairy Whole or 2% milk, cream,  half-and-half, and cream cheese. Whole-fat or sweetened yogurt. Full-fat cheeses or blue cheese. Nondairy creamers and whipped toppings. Processed cheese, cheese spreads, or cheese curds. Condiments Onion and garlic salt, seasoned salt, table salt, and sea salt. Canned and packaged gravies. Worcestershire sauce. Tartar sauce. Barbecue sauce. Teriyaki sauce. Soy sauce, including reduced sodium. Steak sauce. Fish sauce. Oyster sauce. Cocktail sauce. Horseradish. Ketchup and mustard. Meat flavorings and tenderizers. Bouillon cubes. Hot sauce. Tabasco sauce. Marinades. Taco seasonings. Relishes. Fats and Oils Butter, stick margarine, lard, shortening, ghee, and bacon fat. Coconut, palm kernel, or palm oils. Regular salad dressings. Other Pickles and olives. Salted popcorn and pretzels. The items listed above may not be a complete list of foods and beverages to avoid. Contact your dietitian for more information. WHERE CAN I FIND MORE INFORMATION? National Heart, Lung, and Blood Institute: travelstabloid.com Document Released: 01/21/2011 Document Revised: 06/18/2013 Document Reviewed: 12/06/2012 [facility] Patient Information 2015 [place], [place]. This information is not intended to replace advice given to you by your health care provider. Make sure you discuss any questions you have with your health care provider.

## 2014-04-26 NOTE — Telephone Encounter (Signed)
Pt called stating that her bp has been elevated, please f/u with pt

## 2014-05-06 ENCOUNTER — Telehealth: Payer: Self-pay | Admitting: Internal Medicine

## 2014-05-06 ENCOUNTER — Ambulatory Visit: Payer: Medicaid Other | Admitting: Internal Medicine

## 2014-05-06 NOTE — Telephone Encounter (Signed)
Pt called requesting to speak to nurse. Please f/u with pt

## 2014-05-07 ENCOUNTER — Telehealth: Payer: Self-pay | Admitting: Emergency Medicine

## 2014-05-07 MED ORDER — LOSARTAN POTASSIUM 50 MG PO TABS: 50.0000 mg | ORAL_TABLET | Freq: Every day | ORAL | Status: DC

## 2014-05-07 NOTE — Telephone Encounter (Signed)
Pt instructed to d/c Vasotec 20 mg tab and start new medication prescribed Losartan 50 mg tablet daily per Dr. Doreene Burke Medication e-scribed to [facility]  Pt instructed to return next Tuesday(Ask for [facility]) for BP recheck

## 2014-05-23 ENCOUNTER — Encounter: Payer: Self-pay | Admitting: Internal Medicine

## 2014-05-23 ENCOUNTER — Ambulatory Visit: Payer: Medicaid Other | Attending: Internal Medicine | Admitting: Internal Medicine

## 2014-05-23 VITALS — BP 158/83 | HR 77 | Temp 98.2°F | Resp 20

## 2014-05-23 DIAGNOSIS — I10 Essential (primary) hypertension: Secondary | ICD-10-CM | POA: Diagnosis not present

## 2014-05-23 DIAGNOSIS — R350 Frequency of micturition: Secondary | ICD-10-CM | POA: Diagnosis not present

## 2014-05-23 DIAGNOSIS — R7309 Other abnormal glucose: Secondary | ICD-10-CM

## 2014-05-23 DIAGNOSIS — N184 Chronic kidney disease, stage 4 (severe): Secondary | ICD-10-CM | POA: Insufficient documentation

## 2014-05-23 DIAGNOSIS — M329 Systemic lupus erythematosus, unspecified: Secondary | ICD-10-CM | POA: Insufficient documentation

## 2014-05-23 DIAGNOSIS — W19XXXA Unspecified fall, initial encounter: Secondary | ICD-10-CM | POA: Insufficient documentation

## 2014-05-23 DIAGNOSIS — R21 Rash and other nonspecific skin eruption: Secondary | ICD-10-CM | POA: Insufficient documentation

## 2014-05-23 DIAGNOSIS — R7303 Prediabetes: Secondary | ICD-10-CM

## 2014-05-23 LAB — CBC WITH DIFFERENTIAL/PLATELET
Basophils Absolute: 0 10*3/uL (ref 0.0–0.1)
Basophils Relative: 0 % (ref 0–1)
EOS ABS: 0.1 10*3/uL (ref 0.0–0.7)
Eosinophils Relative: 2 % (ref 0–5)
HCT: 36.1 % (ref 36.0–46.0)
Hemoglobin: 11.4 g/dL — ABNORMAL LOW (ref 12.0–15.0)
LYMPHS PCT: 24 % (ref 12–46)
Lymphs Abs: 0.8 10*3/uL (ref 0.7–4.0)
MCH: 26.7 pg (ref 26.0–34.0)
MCHC: 31.6 g/dL (ref 30.0–36.0)
MCV: 84.5 fL (ref 78.0–100.0)
MONOS PCT: 6 % (ref 3–12)
MPV: 11.1 fL (ref 8.6–12.4)
Monocytes Absolute: 0.2 10*3/uL (ref 0.1–1.0)
NEUTROS PCT: 68 % (ref 43–77)
Neutro Abs: 2.2 10*3/uL (ref 1.7–7.7)
Platelets: 237 10*3/uL (ref 150–400)
RBC: 4.27 MIL/uL (ref 3.87–5.11)
RDW: 14.8 % (ref 11.5–15.5)
WBC: 3.3 10*3/uL — ABNORMAL LOW (ref 4.0–10.5)

## 2014-05-23 LAB — LIPID PANEL
Cholesterol: 124 mg/dL (ref 0–200)
HDL: 27 mg/dL — AB (ref >=46)
LDL Cholesterol: 69 mg/dL (ref 0–99)
TRIGLYCERIDES: 138 mg/dL (ref <150)
Total CHOL/HDL Ratio: 4.6 Ratio
VLDL: 28 mg/dL (ref 0–40)

## 2014-05-23 LAB — POCT URINALYSIS DIPSTICK
Bilirubin, UA: NEGATIVE
Glucose, UA: NEGATIVE
KETONES UA: NEGATIVE
Leukocytes, UA: NEGATIVE
Nitrite, UA: NEGATIVE
PH UA: 6
Protein, UA: >=300
Spec Grav, UA: 1.025
Urobilinogen, UA: 0.2

## 2014-05-23 LAB — COMPLETE METABOLIC PANEL WITH GFR
ALT: 28 U/L (ref 0–35)
AST: 41 U/L — ABNORMAL HIGH (ref 0–37)
Albumin: 3.5 g/dL (ref 3.5–5.2)
Alkaline Phosphatase: 76 U/L (ref 39–117)
BILIRUBIN TOTAL: 0.3 mg/dL (ref 0.2–1.2)
BUN: 35 mg/dL — ABNORMAL HIGH (ref 6–23)
CALCIUM: 8.5 mg/dL (ref 8.4–10.5)
CO2: 23 mEq/L (ref 19–32)
Chloride: 106 mEq/L (ref 96–112)
Creat: 2.11 mg/dL — ABNORMAL HIGH (ref 0.50–1.10)
GFR, EST NON AFRICAN AMERICAN: 25 mL/min — AB
GFR, Est African American: 29 mL/min — ABNORMAL LOW
GLUCOSE: 73 mg/dL (ref 70–99)
POTASSIUM: 5.3 meq/L (ref 3.5–5.3)
Sodium: 138 mEq/L (ref 135–145)
Total Protein: 7.9 g/dL (ref 6.0–8.3)

## 2014-05-23 LAB — TSH: TSH: 2.101 u[IU]/mL (ref 0.350–4.500)

## 2014-05-23 LAB — POCT GLYCOSYLATED HEMOGLOBIN (HGB A1C): Hemoglobin A1C: 5.4

## 2014-05-23 MED ORDER — TRIAMCINOLONE ACETONIDE 0.025 % EX OINT: 1.0000 "application " | TOPICAL_OINTMENT | Freq: Two times a day (BID) | CUTANEOUS | Status: DC

## 2014-05-23 NOTE — Patient Instructions (Signed)

## 2014-05-23 NOTE — Progress Notes (Signed)
Patient ID: Jenna [name], female   DOB: 12-22-1955, 59 y.o.   MRN: [id number]   Jenna [name], is a 59 y.o. female  [id number]  [id number]  DOB - [birth year]  Chief Complaint  Patient presents with  . Follow-up  . Hypertension  . Ankle Injury    Left        Subjective:   Jenna [name] is a 59 y.o. female here today for a follow up visit. Patient has SLE, hypertension and morbid obesity. Patient presents to f/u on high BP readings. States balance has been off recently; fell last week and injured left ankle; rates pain 8/10 at present. C/o 1 week history of urinary frequency and urinary urgency. Denies pain, burning, itching with urination. Never smoker.  Patient has No headache, No chest pain, No abdominal pain - No Nausea, No new weakness tingling or numbness, No Cough - SOB.  Problem  Increased Urinary Frequency  Falls  Prediabetes  Rash and Nonspecific Skin Eruption    ALLERGIES: Allergies  Allergen Reactions  . Penicillins Other (See Comments)    Light headed    PAST MEDICAL HISTORY: Past Medical History  Diagnosis Date  . Lupus (systemic lupus erythematosus)   . Obesity   . Renal stone     MEDICATIONS AT HOME: Prior to Admission medications   Medication Sig Start Date End Date Taking? Authorizing Provider  acetaminophen-codeine (TYLENOL #3) 300-30 MG per tablet Take 1 tablet by mouth every 8 (eight) hours as needed for moderate pain. 11/19/13  Yes Tresa Garter, MD  aspirin 81 MG tablet Take 1 tablet (81 mg total) by mouth daily. 12/25/13  Yes Tresa Garter, MD  calcium carbonate (TUMS - DOSED IN MG ELEMENTAL CALCIUM) 500 MG chewable tablet Chew 1 tablet by mouth daily as needed for indigestion or heartburn.   Yes Historical Provider, MD  HYDROcodone-acetaminophen (NORCO/VICODIN) 5-325 MG per tablet Take 1 tablet by mouth every 6 (six) hours as needed for moderate pain.   Yes Historical Provider, MD  hydroxychloroquine (PLAQUENIL) 200 MG  tablet Take 1 tablet (200 mg total) by mouth 2 (two) times daily. 12/25/13  Yes Tresa Garter, MD  losartan (COZAAR) 50 MG tablet Take 1 tablet (50 mg total) by mouth daily. 05/07/14  Yes Tresa Garter, MD  acetaminophen-codeine 120-12 MG/5ML solution Take 10 mLs by mouth every 4 (four) hours as needed (for cough). Patient not taking: Reported on 05/23/2014 12/03/13   Dalia Heading, PA-C  Blood Pressure KIT Check Blood Pressure Daily 11/19/13   Tresa Garter, MD  furosemide (LASIX) 40 MG tablet Take 1 tablet (40 mg total) by mouth daily. Patient not taking: Reported on 05/23/2014 12/25/13   Tresa Garter, MD  Guaifenesin 1200 MG TB12 Take 1 tablet (1,200 mg total) by mouth 2 (two) times daily. Patient not taking: Reported on 05/23/2014 12/03/13   Dalia Heading, PA-C  triamcinolone (KENALOG) 0.025 % ointment Apply 1 application topically 2 (two) times daily. 05/23/14   Tresa Garter, MD     Objective:   Filed Vitals:   05/23/14 1231  BP: 158/83  Pulse: 77  Temp: 98.2 F (36.8 C)  TempSrc: Oral  Resp: 20  SpO2: 98%    Exam General appearance : Awake, alert, not in any distress. Speech Clear. Not toxic looking, morbidly obese HEENT: Atraumatic and Normocephalic, pupils equally reactive to light and accomodation Neck: supple, no JVD. No cervical lymphadenopathy.  Chest:Good air entry bilaterally, no added sounds  CVS: S1  S2 regular, no murmurs.  Abdomen: Bowel sounds present, Non tender and not distended with no gaurding, rigidity or rebound. Extremities: B/L Lower Ext shows no edema, both legs are warm to touch Neurology: Awake alert, and oriented X 3, CN II-XII intact, Non focal Skin:No Rash  Data Review Lab Results  Component Value Date   HGBA1C 5.9* 05/05/2013     Assessment & Plan   1. Increased urinary frequency  - Urinalysis Dipstick - CBC with Differential/Platelet  - Urinalysis, Complete: Negative for UTI  2. Essential  hypertension  - Lipid panel  We have discussed target BP range and blood pressure goal. I have advised patient to check BP regularly and to call us back or report to clinic if the numbers are consistently higher than 140/90. We discussed the importance of compliance with medical therapy and DASH diet recommended, consequences of uncontrolled hypertension discussed.  - continue current BP medications  3. Lupus (systemic lupus erythematosus)  - TSH  4. CKD (chronic kidney disease) stage 4, GFR 15-29 ml/min  - COMPLETE METABOLIC PANEL WITH GFR  5. Falls, initial encounter  - Vit D  25 hydroxy (rtn osteoporosis monitoring) - DG Ankle 2 Views Left; Future  6. Prediabetes  - POCT glycosylated hemoglobin (Hb A1C)  7. Rash and nonspecific skin eruption  - triamcinolone (KENALOG) 0.025 % ointment; Apply 1 application topically 2 (two) times daily.  Dispense: 80 g; Refill: 2  Patient have been counseled extensively about nutrition and exercise Return in about 3 months (around 08/22/2014), or if symptoms worsen or fail to improve, for Follow up HTN, Follow up Pain and comorbidities.  The patient was given clear instructions to go to ER or return to medical center if symptoms don't improve, worsen or new problems develop. The patient verbalized understanding. The patient was told to call to get lab results if they haven't heard anything in the next week.   This note has been created with Surveyor, quantity. Any transcriptional errors are unintentional.    Angelica Chessman, MD, MHA, [place], [place], [place] and [place], [place]   05/23/2014, 1:09 PM

## 2014-05-23 NOTE — Progress Notes (Signed)
Patient presents to f/u on high BP readings States balance has been off recently; fell last week and injured left ankle; rates pain 8/10 at present C/o 1 week history of urinary frequency and urinary urgency Denies pain, burning, itching with urination Never smoker  BP 158/83 left arm with large cuff P 77 T 98.2 oral

## 2014-05-24 LAB — URINALYSIS, COMPLETE
BILIRUBIN URINE: NEGATIVE
Bacteria, UA: NONE SEEN
CASTS: NONE SEEN
CRYSTALS: NONE SEEN
GLUCOSE, UA: NEGATIVE mg/dL
Hgb urine dipstick: NEGATIVE
Ketones, ur: NEGATIVE mg/dL
LEUKOCYTES UA: NEGATIVE
Nitrite: NEGATIVE
PH: 6 (ref 5.0–8.0)
Protein, ur: 100 mg/dL — AB
SQUAMOUS EPITHELIAL / LPF: NONE SEEN
Specific Gravity, Urine: 1.012 (ref 1.005–1.030)
Urobilinogen, UA: 0.2 mg/dL (ref 0.0–1.0)

## 2014-05-24 LAB — VITAMIN D 25 HYDROXY (VIT D DEFICIENCY, FRACTURES): VIT D 25 HYDROXY: 29 ng/mL — AB (ref 30–100)

## 2014-05-28 ENCOUNTER — Telehealth: Payer: Self-pay | Admitting: *Deleted

## 2014-05-28 NOTE — Telephone Encounter (Signed)
Called patient, per Dr. Doreene Burke to let her know that kidney functions and electrolytes are stable over the last year, hemoglobin slightly low but stable over the last 9 months, vitamin D level is low normal but not able to explain loss of balance and frequent falls. Advise patient to continue her current medications: Please report to the clinic immediately if loss of balance continues or symptoms of dizziness and frequent falls persist.  Patient reports she is feeling better now but still feels unsteady like she is going to fall periodically.  She states she is taking her medications as prescribed and that she took some pain medication yesterday to help with her Lupus.  Patient states she will call in to make follow up appointment and is aware that she should call 911 if she falls and is injured or is having severe symptoms of dizziness.

## 2014-06-12 ENCOUNTER — Ambulatory Visit (HOSPITAL_COMMUNITY)
Admission: RE | Admit: 2014-06-12 | Discharge: 2014-06-12 | Disposition: A | Discharge status: Home / Self Care | Payer: Medicaid Other | Source: Ambulatory Visit | Attending: Internal Medicine | Admitting: Internal Medicine

## 2014-06-12 ENCOUNTER — Other Ambulatory Visit: Payer: Self-pay | Admitting: Internal Medicine

## 2014-06-12 DIAGNOSIS — M7989 Other specified soft tissue disorders: Secondary | ICD-10-CM | POA: Insufficient documentation

## 2014-06-12 DIAGNOSIS — W19XXXA Unspecified fall, initial encounter: Secondary | ICD-10-CM

## 2014-06-12 DIAGNOSIS — M25572 Pain in left ankle and joints of left foot: Secondary | ICD-10-CM | POA: Diagnosis not present

## 2014-06-14 ENCOUNTER — Telehealth: Payer: Self-pay | Admitting: *Deleted

## 2014-06-14 ENCOUNTER — Telehealth: Payer: Self-pay | Admitting: Internal Medicine

## 2014-06-14 NOTE — Telephone Encounter (Signed)
Patient called requesting to speak to nurse regarding ankle xray results. Please f/u with patient

## 2014-06-14 NOTE — Telephone Encounter (Signed)
Normal xray of ankle results given to patient.

## 2014-07-04 ENCOUNTER — Encounter: Payer: Self-pay | Admitting: Internal Medicine

## 2014-07-04 ENCOUNTER — Ambulatory Visit: Payer: Medicaid Other | Attending: Internal Medicine | Admitting: Internal Medicine

## 2014-07-04 VITALS — BP 136/83 | HR 89 | Temp 98.0°F | Resp 16 | Ht 67.0 in | Wt 221.0 lb

## 2014-07-04 DIAGNOSIS — M329 Systemic lupus erythematosus, unspecified: Secondary | ICD-10-CM | POA: Diagnosis not present

## 2014-07-04 DIAGNOSIS — R519 Headache, unspecified: Secondary | ICD-10-CM | POA: Insufficient documentation

## 2014-07-04 DIAGNOSIS — R51 Headache: Secondary | ICD-10-CM | POA: Diagnosis not present

## 2014-07-04 DIAGNOSIS — J320 Chronic maxillary sinusitis: Secondary | ICD-10-CM | POA: Diagnosis not present

## 2014-07-04 MED ORDER — PSEUDOEPHEDRINE-ACETAMINOPHEN 30-325 MG PO TABS: 1.0000 | ORAL_TABLET | Freq: Two times a day (BID) | ORAL | Status: DC

## 2014-07-04 NOTE — Progress Notes (Signed)
Patient here complaining of weekly headaches in the right occipital region of her head She states pain is worse with inspiration and associated with blurry vision Patient also describes itchy, watery eyes Patient states she has had a colonoscopy within the last three years

## 2014-07-04 NOTE — Patient Instructions (Signed)

## 2014-07-04 NOTE — Progress Notes (Addendum)
Subjective:     Patient ID: Jenna [name], female   DOB: 1955/08/28, 59 y.o.   MRN: [id number]  HPI  Ms. Zeimet is a 59 yo [place] American female with history of SLE, prediabetes, TIA, chronic maxillary sinusitis, and essential hypertension here today for evaluation of 2 day history of L sided occipital headache, blurry vision, and mild numbness & tingling in left side of face and left arm.   Patient says headache started 2 d ago when she was sitting on couch. Describes it as a sharp, throbbing sensation localized in the L occipital region, does not radiate, and has been persistent for last 2 d. Notes she had driven 8 hours consecutively the day before to visit family and was very tired. Says she felt like there was something there that was causing the pain because it was so localized. Rates headache severity as 7/10 but says she would not describe it as the worst headache of her life. Notes this headache feels different from occasional headaches she gets in forehead region due to her chronic sinusitis. Notes she felt some numbness and tingling in left side of face and in left arm, but this was a transient feeling and has not been persistent. Denies any other neurologic symptoms. She took Tylenol 3 this morning, but it has provided only moderate pain relief. Denies any other aggravating or alleviating factors. Says her vision has been gradually getting worse but in last week noticed her vision has gotten especially blurry, but does not seem associated with headache. Denies associated auras, periorbital pain, lacrimation or rhinorrhea, nausea, vomiting, sleep disturbances. Denies fever, fatigue, chills, unintentional weight loss. No recent head trauma or injury. Denies excessive caffeine intake. Never smoked tobacco or used alcohol. Denies personal or family history of brain tumors or other neurologic diseases. Reports taking all of her medications everyday without missing any doses.    Active  Ambulatory Problems    Diagnosis Date Noted  . Lupus 04/15/2011  . Numbness and tingling of left side of face 05/04/2013  . TIA (transient ischemic attack) 05/05/2013  . CKD (chronic kidney disease) stage 4, GFR 15-29 ml/min 07/02/2013  . Lupus (systemic lupus erythematosus) 09/24/2013  . Encounter for immunization 11/19/2013  . Essential hypertension 04/16/2014  . Increased urinary frequency 05/23/2014  . Falls 05/23/2014  . Prediabetes 05/23/2014  . Rash and nonspecific skin eruption 05/23/2014  . Chronic maxillary sinusitis 07/04/2014  . Occipital headache 07/04/2014   Resolved Ambulatory Problems    Diagnosis Date Noted  . No Resolved Ambulatory Problems   Past Medical History  Diagnosis Date  . Obesity   . Renal stone        Medication List       This list is accurate as of: 07/04/14  1:13 PM.  Always use your most recent med list.               acetaminophen-codeine 300-30 MG per tablet  Commonly known as:  TYLENOL #3  Take 1 tablet by mouth every 8 (eight) hours as needed for moderate pain.     acetaminophen-codeine 120-12 MG/5ML solution  Take 10 mLs by mouth every 4 (four) hours as needed (for cough).     aspirin 81 MG tablet  Take 1 tablet (81 mg total) by mouth daily.     Blood Pressure Kit  Check Blood Pressure Daily     calcium carbonate 500 MG chewable tablet  Commonly known as:  TUMS - dosed in mg elemental calcium  Chew 1 tablet by mouth daily as needed for indigestion or heartburn.     furosemide 40 MG tablet  Commonly known as:  LASIX  Take 1 tablet (40 mg total) by mouth daily.     Guaifenesin 1200 MG Tb12  Take 1 tablet (1,200 mg total) by mouth 2 (two) times daily.     HYDROcodone-acetaminophen 7.5-325 MG per tablet  Commonly known as:  NORCO  Take 1 tablet by mouth every 6 (six) hours as needed.     hydroxychloroquine 200 MG tablet  Commonly known as:  PLAQUENIL  Take 1 tablet (200 mg total) by mouth 2 (two) times daily.      losartan 50 MG tablet  Commonly known as:  COZAAR  Take 1 tablet (50 mg total) by mouth daily.     Pseudoephedrine-APAP 30-325 MG Tabs  Take 1 tablet by mouth 2 (two) times daily.     triamcinolone 0.025 % ointment  Commonly known as:  KENALOG  Apply 1 application topically 2 (two) times daily.        Review of Systems  Constitutional: Negative for fever, chills, activity change, appetite change, fatigue and unexpected weight change.  HENT: Positive for postnasal drip and sinus pressure. Negative for ear discharge, ear pain, facial swelling, hearing loss and trouble swallowing.   Eyes: Positive for visual disturbance. Negative for pain, discharge, redness and itching.  Respiratory: Negative for cough, chest tightness and shortness of breath.   Cardiovascular: Negative for chest pain, palpitations and leg swelling.  Gastrointestinal: Negative for nausea, vomiting, diarrhea, constipation and blood in stool.  Endocrine: Negative for polyuria.  Musculoskeletal: Negative for gait problem, neck pain and neck stiffness.  Skin: Negative for rash.  Neurological: Positive for numbness and headaches. Negative for dizziness, seizures, speech difficulty and light-headedness.  Psychiatric/Behavioral: Negative for confusion, sleep disturbance and agitation.   Objective: Filed Vitals:   07/04/14 1118  BP: 136/83  Pulse: 89  Temp: 98 F (36.7 C)  Resp: 16      Physical Exam  Constitutional: She is oriented to person, place, and time.  Well-appearing, pleasant 59 yo AA woman alert, oriented, and in no acute distress.  HENT:  Head: Normocephalic and atraumatic.  Right Ear: External ear normal.  Left Ear: External ear normal.  Nose: Nose normal.  Mouth/Throat: Oropharynx is clear and moist. No oropharyngeal exudate.  Eyes: Conjunctivae and EOM are normal. Pupils are equal, round, and reactive to light. No scleral icterus.  Neck: Normal range of motion. Neck supple. No JVD present. No  thyromegaly present.  Cardiovascular: Normal rate, regular rhythm, normal heart sounds and intact distal pulses.   Pulmonary/Chest: Effort normal and breath sounds normal. She exhibits no tenderness.  Musculoskeletal: Normal range of motion. She exhibits no edema or tenderness.  Lymphadenopathy:    She has no cervical adenopathy.  Neurological: She is alert and oriented to person, place, and time. She has normal reflexes. She displays normal reflexes. No cranial nerve deficit. She exhibits normal muscle tone. Coordination normal.  Skin: No rash noted.  Psychiatric: She has a normal mood and affect. Her behavior is normal. Judgment and thought content normal.    Assessment & Plan:  Ms. Khokhar is a 59 yo African American female with history of SLE, prediabetes, TIA, chronic maxillary sinusitis, and essential hypertension here today for evaluation of 2 day history of L sided occipital headache, blurry vision, and mild numbness & tingling in left side of face and left arm.  Occipital headache Given  patient's headache is localized in occipital region and started after an exhausting day with 8 hours of consecutive driving, it is likely that this is a tension headache. Acute onset of headache and lack of concerning constitutional symptoms such as fever, chills, fatigue, weight loss, nausea, vomiting, acute visual changes, puts possibility of brain malignancy lower on differential. Advised patient to monitor symptoms for 1 week and to report back if symptoms persist or worsen, in which case we can further evaluate headache with head CT. Patient agreed with plan and says she preferred to wait for week before considering head CT.  Chronic maxillary sinusitis Refilled medications. Orders: -     Pseudoephedrine-APAP 30-325 MG TABS; Take 1 tablet by mouth 2 (two) times daily.   Evaluation and management procedures were performed by me with Medical Student in attendance, note written by medical student  under my supervision and collaboration. I have reviewed the note and I agree with the management and plan.   Angelica Chessman, MD, [place], [place], [place], [place] and [facility] [place], [place]

## 2014-07-04 NOTE — Progress Notes (Signed)
Patient ID: Jenna [name], female   DOB: [birth year], 59 y.o.   MRN: [id number]   Jenna [name], is a 59 y.o. female  SWF:[id number]  [id number]  DOB - 1955/07/17  Chief Complaint  Patient presents with  . Headache        Subjective:   Jenna [name] is a 59 y.o. female with history of SLE, prediabetes, TIA, chronic maxillary sinusitis, and essential hypertension here today for evaluation of 2 day history of L sided occipital headache, blurry vision, and mild numbness & tingling in left side of face and left arm.   Patient says headache started 2 d ago when she was sitting on couch. Describes it as a sharp, throbbing sensation localized in the L occipital region, does not radiate, and has been persistent for last 2 d. Notes she had driven 8 hours consecutively the day before to visit family and was very tired. Says she felt like there was something there that was causing the pain because it was so localized. Rates headache severity as 7/10 but says she would not describe it as the worst headache of her life. Notes this headache feels different from occasional headaches she gets in forehead region due to her chronic sinusitis. Notes she felt some numbness and tingling in left side of face and in left arm, but this was a transient feeling and has not been persistent. Denies any other neurologic symptoms. She took Tylenol 3 this morning, but it has provided only moderate pain relief. Denies any other aggravating or alleviating factors. Says her vision has been gradually getting worse but in last week noticed her vision has gotten especially blurry, but does not seem associated with headache. Denies associated auras, periorbital pain, lacrimation or rhinorrhea, nausea, vomiting, sleep disturbances. Denies fever, fatigue, chills, unintentional weight loss. No recent head trauma or injury. Denies excessive caffeine intake. Never smoked tobacco or used alcohol. Denies personal or family history of  brain tumors or other neurologic diseases. Reports taking all of her medications everyday without missing any doses.   Patient has No headache, No chest pain, No abdominal pain - No Nausea, No new weakness tingling or numbness, No Cough - SOB.  Problem  Chronic Maxillary Sinusitis  Occipital Headache    ALLERGIES: Allergies  Allergen Reactions  . Penicillins Other (See Comments)    Light headed    PAST MEDICAL HISTORY: Past Medical History  Diagnosis Date  . Lupus (systemic lupus erythematosus)   . Obesity   . Renal stone     MEDICATIONS AT HOME: Prior to Admission medications   Medication Sig Start Date End Date Taking? Authorizing Provider  aspirin 81 MG tablet Take 1 tablet (81 mg total) by mouth daily. 12/25/13  Yes Tresa Garter, MD  hydroxychloroquine (PLAQUENIL) 200 MG tablet Take 1 tablet (200 mg total) by mouth 2 (two) times daily. 12/25/13  Yes Tresa Garter, MD  acetaminophen-codeine (TYLENOL #3) 300-30 MG per tablet Take 1 tablet by mouth every 8 (eight) hours as needed for moderate pain. Patient not taking: Reported on 07/04/2014 11/19/13   Tresa Garter, MD  acetaminophen-codeine 120-12 MG/5ML solution Take 10 mLs by mouth every 4 (four) hours as needed (for cough). Patient not taking: Reported on 05/23/2014 12/03/13   Dalia Heading, PA-C  Blood Pressure KIT Check Blood Pressure Daily 11/19/13   Tresa Garter, MD  calcium carbonate (TUMS - DOSED IN MG ELEMENTAL CALCIUM) 500 MG chewable tablet Chew 1 tablet by mouth daily as needed for  indigestion or heartburn.    Historical Provider, MD  furosemide (LASIX) 40 MG tablet Take 1 tablet (40 mg total) by mouth daily. Patient not taking: Reported on 05/23/2014 12/25/13    E , MD  Guaifenesin 1200 MG TB12 Take 1 tablet (1,200 mg total) by mouth 2 (two) times daily. Patient not taking: Reported on 05/23/2014 12/03/13   Christopher Lawyer, PA-C  HYDROcodone-acetaminophen (NORCO) 7.5-325  MG per tablet Take 1 tablet by mouth every 6 (six) hours as needed. 05/31/14   Historical Provider, MD  losartan (COZAAR) 50 MG tablet Take 1 tablet (50 mg total) by mouth daily. Patient not taking: Reported on 07/04/2014 05/07/14    E , MD  Pseudoephedrine-APAP 30-325 MG TABS Take 1 tablet by mouth 2 (two) times daily. 07/04/14    E , MD  triamcinolone (KENALOG) 0.025 % ointment Apply 1 application topically 2 (two) times daily. Patient not taking: Reported on 07/04/2014 05/23/14    E , MD     Objective:   Filed Vitals:   07/04/14 1118  BP: 136/83  Pulse: 89  Temp: 98 F (36.7 C)  Resp: 16  Height: 5' 7" (1.702 m)  Weight: 221 lb (100.245 kg)  SpO2: 97%    Exam General appearance : Awake, alert, not in any distress. Speech Clear. Not toxic looking HEENT: Atraumatic and Normocephalic, pupils equally reactive to light and accomodation Neck: supple, no JVD. No cervical lymphadenopathy.  Chest:Good air entry bilaterally, no added sounds  CVS: S1 S2 regular, no murmurs.  Abdomen: Bowel sounds present, Non tender and not distended with no gaurding, rigidity or rebound. Extremities: B/L Lower Ext shows no edema, both legs are warm to touch Neurology: Awake alert, and oriented X 3, CN II-XII intact, Non focal Skin:No Rash  Data Review Lab Results  Component Value Date   HGBA1C 5.4 05/23/2014   HGBA1C 5.9* 05/05/2013     Assessment & Plan   1. Lupus (systemic lupus erythematosus) Follow up with Rheumatologist  2. Chronic maxillary sinusitis  - Pseudoephedrine-APAP 30-325 MG TABS; Take 1 tablet by mouth 2 (two) times daily.  Dispense: 30 each; Refill: 0  3. Occipital headache  - Pseudoephedrine-APAP 30-325 MG TABS; Take 1 tablet by mouth 2 (two) times daily.  Dispense: 30 each; Refill: 0  Patient have been counseled extensively about nutrition and exercise  Return in about 1 week (around 07/11/2014) for BP Check, Nurse  Visit.  The patient was given clear instructions to go to ER or return to medical center if symptoms don't improve, worsen or new problems develop. The patient verbalized understanding. The patient was told to call to get lab results if they haven't heard anything in the next week.   This note has been created with Dragon speech recognition software and smart phrase technology. Any transcriptional errors are unintentional.    , , MD, MHA, CPE, FACP, FAAP [facility] [place], [place] [phone]   07/04/2014, 12:00 PM 

## 2014-08-10 ENCOUNTER — Encounter (HOSPITAL_COMMUNITY): Payer: Self-pay | Admitting: Emergency Medicine

## 2014-08-10 ENCOUNTER — Emergency Department (HOSPITAL_COMMUNITY)
Admission: EM | Admit: 2014-08-10 | Discharge: 2014-08-10 | Disposition: A | Discharge status: Home / Self Care | Payer: Medicaid Other | Attending: Emergency Medicine | Admitting: Emergency Medicine

## 2014-08-10 DIAGNOSIS — R51 Headache: Secondary | ICD-10-CM | POA: Diagnosis present

## 2014-08-10 DIAGNOSIS — Z7982 Long term (current) use of aspirin: Secondary | ICD-10-CM | POA: Insufficient documentation

## 2014-08-10 DIAGNOSIS — E669 Obesity, unspecified: Secondary | ICD-10-CM | POA: Diagnosis not present

## 2014-08-10 DIAGNOSIS — Z79899 Other long term (current) drug therapy: Secondary | ICD-10-CM | POA: Diagnosis not present

## 2014-08-10 DIAGNOSIS — Z87442 Personal history of urinary calculi: Secondary | ICD-10-CM | POA: Diagnosis not present

## 2014-08-10 DIAGNOSIS — Z880 Allergy status to penicillin: Secondary | ICD-10-CM | POA: Insufficient documentation

## 2014-08-10 DIAGNOSIS — Z8739 Personal history of other diseases of the musculoskeletal system and connective tissue: Secondary | ICD-10-CM | POA: Insufficient documentation

## 2014-08-10 DIAGNOSIS — R519 Headache, unspecified: Secondary | ICD-10-CM

## 2014-08-10 MED ORDER — KETOROLAC TROMETHAMINE 60 MG/2ML IM SOLN
60.0000 mg | Freq: Once | INTRAMUSCULAR | Status: DC
  Filled 2014-08-10: qty 2

## 2014-08-10 MED ORDER — DIPHENHYDRAMINE HCL 25 MG PO CAPS
50.0000 mg | ORAL_CAPSULE | Freq: Once | ORAL | Status: DC
  Filled 2014-08-10: qty 2

## 2014-08-10 MED ORDER — METOCLOPRAMIDE HCL 10 MG PO TABS: 10.0000 mg | ORAL_TABLET | Freq: Once | ORAL | Status: DC

## 2014-08-10 NOTE — ED Provider Notes (Signed)
CSN: [id number]     Arrival date & time 08/10/14  4098 History   First MD Initiated Contact with Patient 08/10/14 [phone]     Chief Complaint  Patient presents with  . Headache     (Consider location/radiation/quality/duration/timing/severity/associated sxs/prior Treatment) HPI Jenna [name] is a 59 y.o. female because in for evaluation of headache that woke her from her sleep around 3:30 AM. She reports a sensation as a stabbing pain in her frontal head. She reports this headache is typical headache she has had in the past. She attributes her headache to her stress level as well as her chronic sinusitis. She reports this headache has resolved on its own spontaneously. She denies any discomfort whatsoever now on the ED. She denies numbness, weakness, nausea or vomiting, chest pain, shortness of breath patient changes, diaphoresis. No other aggravating or modifying factors.  Past Medical History  Diagnosis Date  . Lupus (systemic lupus erythematosus)   . Obesity   . Renal stone    History reviewed. No pertinent past surgical history. Family History  Problem Relation Age of Onset  . Diabetes Mother   . Hypertension Father   . Cancer Sister   . Hypertension Brother    History  Substance Use Topics  . Smoking status: Never Smoker   . Smokeless tobacco: Never Used  . Alcohol Use: No   OB History    No data available     Review of Systems A 10 point review of systems was completed and was negative except for pertinent positives and negatives as mentioned in the history of present illness     Allergies  Chocolate and Penicillins  Home Medications   Prior to Admission medications   Medication Sig Start Date End Date Taking? Authorizing Provider  acetaminophen (TYLENOL) 500 MG tablet Take 1,000 mg by mouth every 6 (six) hours as needed for headache.   Yes Historical Provider, MD  aspirin 81 MG tablet Take 1 tablet (81 mg total) by mouth daily. 12/25/13  Yes Tresa Garter, MD  calcium carbonate (TUMS - DOSED IN MG ELEMENTAL CALCIUM) 500 MG chewable tablet Chew 1 tablet by mouth daily as needed for indigestion or heartburn.   Yes Historical Provider, MD  HYDROcodone-acetaminophen (NORCO) 7.5-325 MG per tablet Take 1 tablet by mouth every 6 (six) hours as needed for moderate pain or severe pain.  05/31/14  Yes Historical Provider, MD  hydroxychloroquine (PLAQUENIL) 200 MG tablet Take 1 tablet (200 mg total) by mouth 2 (two) times daily. 12/25/13  Yes Tresa Garter, MD  losartan (COZAAR) 50 MG tablet Take 50 mg by mouth daily. 07/27/14  Yes Historical Provider, MD  Multiple Vitamins-Minerals (MULTIVITAMIN & MINERAL PO) Take 1 tablet by mouth daily.   Yes Historical Provider, MD   BP 178/92 mmHg  Pulse 81  Temp(Src) 98.1 F (36.7 C) (Oral)  Resp 17  SpO2 100% Physical Exam  Constitutional: She is oriented to person, place, and time. She appears well-developed and well-nourished. No distress.  HENT:  Head: Normocephalic and atraumatic.  Mouth/Throat: Oropharynx is clear and moist.  Eyes: Conjunctivae are normal. Pupils are equal, round, and reactive to light. Right eye exhibits no discharge. Left eye exhibits no discharge. No scleral icterus.  Neck: Normal range of motion. Neck supple.  Cardiovascular: Normal rate, regular rhythm and normal heart sounds.   Pulmonary/Chest: Effort normal and breath sounds normal. No respiratory distress. She has no wheezes. She has no rales.  Abdominal: Soft. She exhibits no  distension and no mass. There is no tenderness. There is no rebound and no guarding.  Musculoskeletal: She exhibits no tenderness.  Neurological: She is alert and oriented to person, place, and time.  Cranial Nerves II-XII grossly intact. Motor and sensation 5/5 in all 4 extremities. Completes finger to nose coordination movements without difficulty. Extraocular movements intact without nystagmus. Gait is baseline without ataxia  Skin: Skin is warm  and dry. No rash noted. She is not diaphoretic.  Psychiatric: She has a normal mood and affect.  Nursing note and vitals reviewed.   ED Course  Procedures (including critical care time) Labs Review Labs Reviewed - No data to display  Imaging Review No results found.   EKG Interpretation None     Filed Vitals:   08/10/14 0525  BP: 178/92  Pulse: 81  Temp: 98.1 F (36.7 C)  TempSrc: Oral  Resp: 17  SpO2: 100%    MDM  Vitals stable - WNL -afebrile Pt resting comfortably in ED. patient denies any discomfort whatsoever now the ED. Reports her headache has spontaneously resolved. She has experienced these types of headaches in the past and this is not a new problem. PE--normal nerve exam and otherwise grossly benign physical exam. Doubt any acute neurological or vascular process. No evidence of other emergent pathology. Patient reports she will be able to follow-up with her primary care for further evaluation and management of symptoms. I discussed all relevant lab findings and imaging results with pt and they verbalized understanding. Discussed f/u with PCP within 48 hrs and return precautions, pt very amenable to plan.  Final diagnoses:  Acute nonintractable headache, unspecified headache type        Comer Locket, PA-C 08/10/14 [id number]  Everlene Balls, MD 08/10/14 [phone]

## 2014-08-10 NOTE — ED Notes (Signed)
Pt left with all belongings and refused wheelchair.

## 2014-08-10 NOTE — Discharge Instructions (Signed)
You were evaluated in the ED today for your headache. He reported her headache has completely resolved. It is important for you to follow-up with your primary care for further evaluation and management of your symptoms. Return to ED for new or worsening symptoms.

## 2014-08-10 NOTE — ED Notes (Signed)
Patient with headache that woke her from sleep around 0330.  She denies any nausea or vomiting, but does have some photophobia.  She denies any noise sensitivity.

## 2014-08-15 ENCOUNTER — Ambulatory Visit: Payer: Medicaid Other | Attending: Internal Medicine | Admitting: Internal Medicine

## 2014-08-15 ENCOUNTER — Encounter: Payer: Self-pay | Admitting: Internal Medicine

## 2014-08-15 VITALS — BP 162/107 | HR 81 | Temp 98.0°F | Resp 16 | Wt 229.0 lb

## 2014-08-15 DIAGNOSIS — I10 Essential (primary) hypertension: Secondary | ICD-10-CM | POA: Diagnosis not present

## 2014-08-15 DIAGNOSIS — N184 Chronic kidney disease, stage 4 (severe): Secondary | ICD-10-CM | POA: Insufficient documentation

## 2014-08-15 DIAGNOSIS — M329 Systemic lupus erythematosus, unspecified: Secondary | ICD-10-CM

## 2014-08-15 MED ORDER — LOSARTAN POTASSIUM 100 MG PO TABS: 100.0000 mg | ORAL_TABLET | Freq: Every day | ORAL | Status: DC

## 2014-08-15 NOTE — Progress Notes (Signed)
Patient ID: Jenna [name], female   DOB: 03/23/1955, 59 y.o.   MRN: [id number]   Huda Petrey, is a 59 y.o. female  [id number]:[id number]  [id number]  DOB - [birth year]  Chief Complaint  Patient presents with  . Follow-up        Subjective:   Jenna [name] is a 59 y.o. female here today for a follow up visit. Patient has multiple medical history as listed below, she is here today with major complain of excessive tiredness, lack of interest in routine daily activities, loss of appetite, lack of enough sleep at night. Patient currently denied being depressed when asked she said "NO I am not depressed". She says she still goes out to do other activities, but she is just under a lot of stress lately and some of her family members are given her "tough" times. She is very emotional and she takes things personal, also when she gets in conflict with anyone, she internalizes it and that bothers her for days. She currently sees a therapist, and she has appointment next week. She declined to start medications for depression. Patient still has occasional headache but not today, No chest pain, No abdominal pain - No Nausea, No new weakness tingling or numbness, No Cough - SOB.  No problems updated.  ALLERGIES: Allergies  Allergen Reactions  . Chocolate Nausea And Vomiting  . Penicillins Other (See Comments)    Light headed    PAST MEDICAL HISTORY: Past Medical History  Diagnosis Date  . Lupus (systemic lupus erythematosus)   . Obesity   . Renal stone     MEDICATIONS AT HOME: Prior to Admission medications   Medication Sig Start Date End Date Taking? Authorizing Provider  acetaminophen (TYLENOL) 500 MG tablet Take 1,000 mg by mouth every 6 (six) hours as needed for headache.    Historical Provider, MD  aspirin 81 MG tablet Take 1 tablet (81 mg total) by mouth daily. 12/25/13   Quentin Angst, MD  calcium carbonate (TUMS - DOSED IN MG ELEMENTAL CALCIUM) 500 MG chewable tablet Chew 1  tablet by mouth daily as needed for indigestion or heartburn.    Historical Provider, MD  HYDROcodone-acetaminophen (NORCO) 7.5-325 MG per tablet Take 1 tablet by mouth every 6 (six) hours as needed for moderate pain or severe pain.  05/31/14   Historical Provider, MD  hydroxychloroquine (PLAQUENIL) 200 MG tablet Take 1 tablet (200 mg total) by mouth 2 (two) times daily. 12/25/13   Quentin Angst, MD  losartan (COZAAR) 100 MG tablet Take 1 tablet (100 mg total) by mouth daily. 08/15/14   Quentin Angst, MD  Multiple Vitamins-Minerals (MULTIVITAMIN & MINERAL PO) Take 1 tablet by mouth daily.    Historical Provider, MD     Objective:   Filed Vitals:   08/15/14 1415 08/15/14 1421  BP: 167/106 162/107  Pulse: 80 81  Temp: 98 F (36.7 C)   Resp: 16   Weight: 229 lb (103.874 kg)   SpO2: 100%     Exam General appearance : Awake, alert, not in any distress. Speech Clear. Not toxic looking, morbidly obese HEENT: Atraumatic and Normocephalic, pupils equally reactive to light and accomodation Neck: supple, no JVD. No cervical lymphadenopathy.  Chest:Good air entry bilaterally, no added sounds  CVS: S1 S2 regular, no murmurs.  Abdomen: Bowel sounds present, Non tender and not distended with no gaurding, rigidity or rebound. Extremities: B/L Lower Ext shows no edema, both legs are warm to touch Neurology: Awake alert, and  oriented X 3, CN II-XII intact, Non focal Skin:No Rash  Data Review Lab Results  Component Value Date   HGBA1C 5.4 05/23/2014   HGBA1C 5.9* 05/05/2013     Assessment & Plan   1. Lupus (systemic lupus erythematosus) Continue current medication Follow-up with rheumatologist  2. CKD (chronic kidney disease) stage 4, GFR 15-29 ml/min Follow-up with nephrologist on 08/23/2014 as scheduled  3. Essential hypertension  - losartan (COZAAR) 100 MG tablet; Take 1 tablet (100 mg total) by mouth daily.  Dispense: 90 tablet; Refill: 3  - We have discussed target BP  range and blood pressure goal - I have advised patient to check BP regularly and to call us back or report to clinic if the numbers are consistently higher than 140/90  - We discussed the importance of compliance with medical therapy and DASH diet recommended, consequences of uncontrolled hypertension discussed.  - continue current BP medications  Patient have been counseled extensively about nutrition and exercise Return in about 2 days (around 08/17/2014), or if symptoms worsen or fail to improve, for BP Check, Nurse Visit.  The patient was given clear instructions to go to ER or return to medical center if symptoms don't improve, worsen or new problems develop. The patient verbalized understanding. The patient was told to call to get lab results if they haven't heard anything in the next week.   This note has been created with Surveyor, quantity. Any transcriptional errors are unintentional.    Angelica Chessman, MD, [place], [place], [place], [place] and [place], [place]   08/15/2014, 3:01 PM

## 2014-08-15 NOTE — Patient Instructions (Signed)
DASH Eating Plan DASH stands for "Dietary Approaches to Stop Hypertension." The DASH eating plan is a healthy eating plan that has been shown to reduce high blood pressure (hypertension). Additional health benefits may include reducing the risk of type 2 diabetes mellitus, heart disease, and stroke. The DASH eating plan may also help with weight loss. WHAT DO I NEED TO KNOW ABOUT THE DASH EATING PLAN? For the DASH eating plan, you will follow these general guidelines:  Choose foods with a percent daily value for sodium of less than 5% (as listed on the food label).  Use salt-free seasonings or herbs instead of table salt or sea salt.  Check with your health care provider or pharmacist before using salt substitutes.  Eat lower-sodium products, often labeled as "lower sodium" or "no salt added."  Eat fresh foods.  Eat more vegetables, fruits, and low-fat dairy products.  Choose whole grains. Look for the word "whole" as the first word in the ingredient list.  Choose fish and skinless chicken or turkey more often than red meat. Limit fish, poultry, and meat to 6 oz (170 g) each day.  Limit sweets, desserts, sugars, and sugary drinks.  Choose heart-healthy fats.  Limit cheese to 1 oz (28 g) per day.  Eat more home-cooked food and less restaurant, buffet, and fast food.  Limit fried foods.  Cook foods using methods other than frying.  Limit canned vegetables. If you do use them, rinse them well to decrease the sodium.  When eating at a restaurant, ask that your food be prepared with less salt, or no salt if possible. WHAT FOODS CAN I EAT? Seek help from a dietitian for individual calorie needs. Grains Whole grain or whole wheat bread. Brown rice. Whole grain or whole wheat pasta. Quinoa, bulgur, and whole grain cereals. Low-sodium cereals. Corn or whole wheat flour tortillas. Whole grain cornbread. Whole grain crackers. Low-sodium crackers. Vegetables Fresh or frozen vegetables  (raw, steamed, roasted, or grilled). Low-sodium or reduced-sodium tomato and vegetable juices. Low-sodium or reduced-sodium tomato sauce and paste. Low-sodium or reduced-sodium canned vegetables.  Fruits All fresh, canned (in natural juice), or frozen fruits. Meat and Other Protein Products Ground beef (85% or leaner), grass-fed beef, or beef trimmed of fat. Skinless chicken or turkey. Ground chicken or turkey. Pork trimmed of fat. All fish and seafood. Eggs. Dried beans, peas, or lentils. Unsalted nuts and seeds. Unsalted canned beans. Dairy Low-fat dairy products, such as skim or 1% milk, 2% or reduced-fat cheeses, low-fat ricotta or cottage cheese, or plain low-fat yogurt. Low-sodium or reduced-sodium cheeses. Fats and Oils Tub margarines without trans fats. Light or reduced-fat mayonnaise and salad dressings (reduced sodium). Avocado. Safflower, olive, or canola oils. Natural peanut or almond butter. Other Unsalted popcorn and pretzels. The items listed above may not be a complete list of recommended foods or beverages. Contact your dietitian for more options. WHAT FOODS ARE NOT RECOMMENDED? Grains White bread. White pasta. White rice. Refined cornbread. Bagels and croissants. Crackers that contain trans fat. Vegetables Creamed or fried vegetables. Vegetables in a cheese sauce. Regular canned vegetables. Regular canned tomato sauce and paste. Regular tomato and vegetable juices. Fruits Dried fruits. Canned fruit in light or heavy syrup. Fruit juice. Meat and Other Protein Products Fatty cuts of meat. Ribs, chicken wings, bacon, sausage, bologna, salami, chitterlings, fatback, hot dogs, bratwurst, and packaged luncheon meats. Salted nuts and seeds. Canned beans with salt. Dairy Whole or 2% milk, cream, half-and-half, and cream cheese. Whole-fat or sweetened yogurt. Full-fat   cheeses or blue cheese. Nondairy creamers and whipped toppings. Processed cheese, cheese spreads, or cheese  curds. Condiments Onion and garlic salt, seasoned salt, table salt, and sea salt. Canned and packaged gravies. Worcestershire sauce. Tartar sauce. Barbecue sauce. Teriyaki sauce. Soy sauce, including reduced sodium. Steak sauce. Fish sauce. Oyster sauce. Cocktail sauce. Horseradish. Ketchup and mustard. Meat flavorings and tenderizers. Bouillon cubes. Hot sauce. Tabasco sauce. Marinades. Taco seasonings. Relishes. Fats and Oils Butter, stick margarine, lard, shortening, ghee, and bacon fat. Coconut, palm kernel, or palm oils. Regular salad dressings. Other Pickles and olives. Salted popcorn and pretzels. The items listed above may not be a complete list of foods and beverages to avoid. Contact your dietitian for more information. WHERE CAN I FIND MORE INFORMATION? National Heart, Lung, and Blood Institute: www.nhlbi.nih.gov/health/health-topics/topics/dash/ Document Released: 01/21/2011 Document Revised: 06/18/2013 Document Reviewed: 12/06/2012 ExitCare Patient Information 2015 [employer]. This information is not intended to replace advice given to you by your health care provider. Make sure you discuss any questions you have with your health care provider. Hypertension Hypertension, commonly called high blood pressure, is when the force of blood pumping through your arteries is too strong. Your arteries are the blood vessels that carry blood from your heart throughout your body. A blood pressure reading consists of a higher number over a lower number, such as 110/72. The higher number (systolic) is the pressure inside your arteries when your heart pumps. The lower number (diastolic) is the pressure inside your arteries when your heart relaxes. Ideally you want your blood pressure below 120/80. Hypertension forces your heart to work harder to pump blood. Your arteries may become narrow or stiff. Having hypertension puts you at risk for heart disease, stroke, and other problems.  RISK  FACTORS Some risk factors for high blood pressure are controllable. Others are not.  Risk factors you cannot control include:   Race. You may be at higher risk if you are African American.  Age. Risk increases with age.  Gender. Men are at higher risk than women before age 45 years. After age 65, women are at higher risk than men. Risk factors you can control include:  Not getting enough exercise or physical activity.  Being overweight.  Getting too much fat, sugar, calories, or salt in your diet.  Drinking too much alcohol. SIGNS AND SYMPTOMS Hypertension does not usually cause signs or symptoms. Extremely high blood pressure (hypertensive crisis) may cause headache, anxiety, shortness of breath, and nosebleed. DIAGNOSIS  To check if you have hypertension, your health care provider will measure your blood pressure while you are seated, with your arm held at the level of your heart. It should be measured at least twice using the same arm. Certain conditions can cause a difference in blood pressure between your right and left arms. A blood pressure reading that is higher than normal on one occasion does not mean that you need treatment. If one blood pressure reading is high, ask your health care provider about having it checked again. TREATMENT  Treating high blood pressure includes making lifestyle changes and possibly taking medicine. Living a healthy lifestyle can help lower high blood pressure. You may need to change some of your habits. Lifestyle changes may include:  Following the DASH diet. This diet is high in fruits, vegetables, and whole grains. It is low in salt, red meat, and added sugars.  Getting at least 2 hours of brisk physical activity every week.  Losing weight if necessary.  Not smoking.  Limiting   alcoholic beverages.  Learning ways to reduce stress. If lifestyle changes are not enough to get your blood pressure under control, your health care provider may  prescribe medicine. You may need to take more than one. Work closely with your health care provider to understand the risks and benefits. HOME CARE INSTRUCTIONS  Have your blood pressure rechecked as directed by your health care provider.   Take medicines only as directed by your health care provider. Follow the directions carefully. Blood pressure medicines must be taken as prescribed. The medicine does not work as well when you skip doses. Skipping doses also puts you at risk for problems.   Do not smoke.   Monitor your blood pressure at home as directed by your health care provider. SEEK MEDICAL CARE IF:   You think you are having a reaction to medicines taken.  You have recurrent headaches or feel dizzy.  You have swelling in your ankles.  You have trouble with your vision. SEEK IMMEDIATE MEDICAL CARE IF:  You develop a severe headache or confusion.  You have unusual weakness, numbness, or feel faint.  You have severe chest or abdominal pain.  You vomit repeatedly.  You have trouble breathing. MAKE SURE YOU:   Understand these instructions.  Will watch your condition.  Will get help right away if you are not doing well or get worse. Document Released: 02/01/2005 Document Revised: 06/18/2013 Document Reviewed: 11/24/2012 ExitCare Patient Information 2015 [employer]. This information is not intended to replace advice given to you by your health care provider. Make sure you discuss any questions you have with your health care provider.  

## 2014-08-15 NOTE — Progress Notes (Signed)
Patient here for routine follow up Patient presents with elevated blood pressure but States she has been under a lot of stress recently

## 2014-09-03 ENCOUNTER — Encounter: Payer: Medicaid Other | Admitting: Pharmacist

## 2014-09-05 ENCOUNTER — Encounter: Payer: Self-pay | Admitting: Pharmacist

## 2014-09-05 ENCOUNTER — Ambulatory Visit: Payer: Medicaid Other | Attending: Internal Medicine | Admitting: Pharmacist

## 2014-09-05 VITALS — BP 135/91 | HR 73

## 2014-09-05 DIAGNOSIS — I10 Essential (primary) hypertension: Secondary | ICD-10-CM | POA: Diagnosis not present

## 2014-09-05 NOTE — Progress Notes (Signed)
Patient ID: Jenna [name], female   DOB: 13-May-1955, 59 y.o.   MRN: [id number]  S:    Patient arrives in good spirits. She presents to the clinic for blood pressure evaluation.   Patient reports adherence to her medications. Completed medication reconciliation.   She reports significantly stress due to her medical conditions and medical care. She does not like to go to the doctor so much and has been stressed about getting in for her blood pressure checks.  She reports that her Lupus has been difficult to manage which has increased her stress and therefore her blood pressure.  She records her blood pressure in a book and keeps it with her for all medical appointments. She reports having a blood pressure monitor at home but she feels that it does not work accurately.   She reports that her renal doctor started a fluid pill on 09/03/14 (two days ago) for blood pressure and because she had some lower extremity edema. She is not sure of the name of this medication.   Current BP Medications include:  Losartan 100 mg daily and a fluid pill  O:   Last 3 Office BP readings: BP Readings from Last 3 Encounters:  09/05/14 135/91  08/15/14 162/107  08/10/14 165/87    BMET    Component Value Date/Time   NA 138 05/23/2014 1301   K 5.3 05/23/2014 1301   CL 106 05/23/2014 1301   CO2 23 05/23/2014 1301   GLUCOSE 73 05/23/2014 1301   BUN 35* 05/23/2014 1301   CREATININE 2.11* 05/23/2014 1301   CREATININE 2.20* 08/10/2013 1355   CALCIUM 8.5 05/23/2014 1301   CALCIUM 6.9* 01/23/2010 0616   GFRNONAA 25* 05/23/2014 1301   GFRNONAA 24* 05/05/2013 1135   GFRAA 29* 05/23/2014 1301   GFRAA 28* 05/05/2013 1135    A/P: Hypertension: currently under improved control with the addition of the new fluid pill based on reading of 135/91. Patient would like to closely follow with our clinic for blood pressure checks until her blood pressure is stable and controlled. She will bring all of her medications  to her next visit so that I can add the new fluid pill to her medication list. I will not make any changes at this time as I am unsure of the new medication. I will plan to make any necessary medication changes next week. Counseled patient on decreased salt intake and to avoid salt substitute products due to her elevated potassium and kidney disease. Encouraged her to exercise, even if it is just walking in the mall. Also educated patient on the importance of stress management and deep breathing, which will help decrease her blood pressure. Results reviewed and written information provided.   F/U Clinic Visit with pharmacy next week. Total time in face-to-face 35 counseling  minutes.

## 2014-09-05 NOTE — Patient Instructions (Addendum)
It was nice to meet you today Ms. Parrilla!  Your blood pressure was much better today! I think deep breathing and stress relief will help.  I will see you next week!

## 2014-09-05 NOTE — Assessment & Plan Note (Signed)
Hypertension: currently under improved control with the addition of the new fluid pill based on reading of 135/91. Patient would like to closely follow with our clinic for blood pressure checks until her blood pressure is stable and controlled. She will bring all of her medications to her next visit so that I can add the new fluid pill to her medication list. I will not make any changes at this time as I am unsure of the new medication. I will plan to make any necessary medication changes next week. Counseled patient on decreased salt intake and to avoid salt substitute products due to her elevated potassium and kidney disease. Encouraged her to exercise, even if it is just walking in the mall. Also educated patient on the importance of stress management and deep breathing, which will help decrease her blood pressure. Results reviewed and written information provided.   F/U Clinic Visit with pharmacy next week. Total time in face-to-face 35 counseling  minutes.

## 2014-09-10 ENCOUNTER — Encounter: Payer: Medicaid Other | Admitting: Pharmacist

## 2014-09-17 ENCOUNTER — Encounter: Payer: Medicaid Other | Admitting: Pharmacist

## 2014-09-19 ENCOUNTER — Telehealth: Payer: Self-pay | Admitting: Internal Medicine

## 2014-09-19 ENCOUNTER — Ambulatory Visit: Payer: Medicaid Other | Attending: Internal Medicine | Admitting: Pharmacist

## 2014-09-19 ENCOUNTER — Encounter: Payer: Self-pay | Admitting: Pharmacist

## 2014-09-19 VITALS — BP 160/97 | HR 81 | Wt 219.0 lb

## 2014-09-19 DIAGNOSIS — I10 Essential (primary) hypertension: Secondary | ICD-10-CM

## 2014-09-19 MED ORDER — AMLODIPINE BESYLATE 5 MG PO TABS: 5.0000 mg | ORAL_TABLET | Freq: Every day | ORAL | Status: DC

## 2014-09-19 NOTE — Telephone Encounter (Signed)
Please call pt as pt is interested in an appt for health coaching. I am not sure if she needs to be referred.

## 2014-09-19 NOTE — Progress Notes (Deleted)
S:    Patient arrives ***.    She presents to the clinic for hypertension evaluation.  Diagnosed with Hypertension in the year of ***.    Patient {Actions; denies-reports:120008} adherence with medications.  Current BP Medications include:  ***  Antihypertensives tried in the past include: ***   O:   Last 3 Office BP readings: BP Readings from Last 3 Encounters:  09/19/14 160/97  09/05/14 135/91  08/15/14 162/107    BMET    Component Value Date/Time   NA 138 05/23/2014 1301   K 5.3 05/23/2014 1301   CL 106 05/23/2014 1301   CO2 23 05/23/2014 1301   GLUCOSE 73 05/23/2014 1301   BUN 35* 05/23/2014 1301   CREATININE 2.11* 05/23/2014 1301   CREATININE 2.20* 08/10/2013 1355   CALCIUM 8.5 05/23/2014 1301   CALCIUM 6.9* 01/23/2010 0616   GFRNONAA 25* 05/23/2014 1301   GFRNONAA 24* 05/05/2013 1135   GFRAA 29* 05/23/2014 1301   GFRAA 28* 05/05/2013 1135    A/P:  History of hypertension since *** which currently {Is/is not:9024} controlled on current medications.      Results reviewed and written information provided.   F/U Clinic Visit with Dr. Marland Kitchen  Total time in face-to-face counseling *** minutes.  Patient seen with ***

## 2014-09-19 NOTE — Patient Instructions (Signed)
It was great to see you today!  Start amlodipine 5 mg daily. Let us know if the swelling in your legs get worse.  Come back for a blood pressure check in two weeks.

## 2014-09-19 NOTE — Progress Notes (Signed)
S:    Patient arrives today in good spirits.    She presents to the clinic for hypertension evaluation.    Patient reports adherence with medications.  Current BP Medications include:  Losartan 100 mg daily, Furosemide 40 mg daily  Antihypertensives tried in the past include: Enalapril (throat swelling)   O:   Last 3 Office BP readings: BP Readings from Last 3 Encounters:  09/19/14 160/97  09/05/14 135/91  08/15/14 162/107    BMET    Component Value Date/Time   NA 138 05/23/2014 1301   K 5.3 05/23/2014 1301   CL 106 05/23/2014 1301   CO2 23 05/23/2014 1301   GLUCOSE 73 05/23/2014 1301   BUN 35* 05/23/2014 1301   CREATININE 2.11* 05/23/2014 1301   CREATININE 2.20* 08/10/2013 1355   CALCIUM 8.5 05/23/2014 1301   CALCIUM 6.9* 01/23/2010 0616   GFRNONAA 25* 05/23/2014 1301   GFRNONAA 24* 05/05/2013 1135   GFRAA 29* 05/23/2014 1301   GFRAA 28* 05/05/2013 1135    A/P:  History of hypertension currently uncontrolled at 160/97 on current medications.  Pt has hx of lower leg edema but reports no worsening of sx and is 10 lbs weight negative since last visit. Pt has hx of CKD and creatinine remains stable.   Will initiate amlodipine 5 mg daily for HTN. Counseled pt on s/sx of low BP and to call clinic if experiences worsening s/sx of edema.    Results reviewed and written information provided.   F/U Clinic Visit with Dr. Peterson Ao, PharmD on 8/11.  Total time in face-to-face counseling 30 minutes.  Patient seen with Bennye Alm, PharmD Pharmacy Resident. Pt discussed with Dr Adrian Blackwater.

## 2014-09-19 NOTE — Addendum Note (Signed)
Addended by: Nicoletta Ba A on: 09/19/2014 10:33 AM   Modules accepted: Level of Service

## 2014-09-19 NOTE — Telephone Encounter (Signed)
Patient came into facility stating that she has had stomach pain for a week. She would like to speak to a nurse in regards to her issue. Patient would also like to be referred to Dr. Amil Amen. Please f/u with pt.

## 2014-09-25 NOTE — Telephone Encounter (Signed)
Returned patient's call. Patient states she feels uncomfortable with taking new BP med (amlodipine 5 mg) given at last week's visit with Pharm D; wants to discuss with her Doctor Patient is due for 3 mo f/u with PCP Patient states she will keep tomorrow's BP recheck appt Transferred to appt scheduler

## 2014-09-26 ENCOUNTER — Ambulatory Visit: Payer: Medicaid Other | Attending: Internal Medicine | Admitting: Pharmacist

## 2014-09-26 ENCOUNTER — Encounter: Payer: Self-pay | Admitting: Pharmacist

## 2014-09-26 VITALS — BP 136/88

## 2014-09-26 DIAGNOSIS — I10 Essential (primary) hypertension: Secondary | ICD-10-CM | POA: Insufficient documentation

## 2014-09-26 NOTE — Progress Notes (Signed)
S:    Patient arrives in good spirits but is very anxious. She presents to the clinic for hypertension evaluation.   Patient reports adherence with medications. She reports that she has all of her medications even though her pharmacy shorted her 30 pills the other day. She is very frustrated with the pharmacy and is in the midst of filing a complaint. However, she reports that she has all of her medications that she needs and was able to get the 30 missing tablets from the pharmacy.   Current BP Medications include:  Amlodipine 5 mg, furosemide 40 mg, losartan 100 mg   Antihypertensives tried in the past include: enalapril  Patient reports that she doesn't like that she hasn't seen the physician at every visit and instead has seen myself or the nurse for blood pressure checks.    O:   Last 3 Office BP readings: BP Readings from Last 3 Encounters:  09/26/14 136/88  09/19/14 160/97  09/05/14 135/91    BMET    Component Value Date/Time   NA 138 05/23/2014 1301   K 5.3 05/23/2014 1301   CL 106 05/23/2014 1301   CO2 23 05/23/2014 1301   GLUCOSE 73 05/23/2014 1301   BUN 35* 05/23/2014 1301   CREATININE 2.11* 05/23/2014 1301   CREATININE 2.20* 08/10/2013 1355   CALCIUM 8.5 05/23/2014 1301   CALCIUM 6.9* 01/23/2010 0616   GFRNONAA 25* 05/23/2014 1301   GFRNONAA 24* 05/05/2013 1135   GFRAA 29* 05/23/2014 1301   GFRAA 28* 05/05/2013 1135    A/P:  Hypertension: currently is controlled on current medications. 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 However, could consider increasing amlodipine at next visit - patient would like to discuss medication changes with Dr. Doreene Burke next week.  Encouraged patient to continue working on stress management.  I explained to the patient the different roles of the pharmacist, nurse, and physicians in the clinic and how we do our work flow for blood pressure checks. Patient  verbalized understanding.     Results reviewed and written information provided.   F/U Clinic Visit with Dr. Doreene Burke next week. Patient can follow up for blood pressure visits as needed. Total time in face-to-face counseling 10 minutes.

## 2014-09-26 NOTE — Patient Instructions (Signed)
Your blood pressure is good today.  Keep working on managing your stress - this will really help with keeping your blood pressure down.  Next visit with Dr. Doreene Burke.

## 2014-09-30 ENCOUNTER — Ambulatory Visit: Payer: Medicaid Other | Attending: Internal Medicine | Admitting: Internal Medicine

## 2014-09-30 ENCOUNTER — Encounter: Payer: Self-pay | Admitting: Internal Medicine

## 2014-09-30 ENCOUNTER — Telehealth: Payer: Self-pay | Admitting: Internal Medicine

## 2014-09-30 DIAGNOSIS — Z7982 Long term (current) use of aspirin: Secondary | ICD-10-CM | POA: Insufficient documentation

## 2014-09-30 DIAGNOSIS — Z6833 Body mass index (BMI) 33.0-33.9, adult: Secondary | ICD-10-CM | POA: Insufficient documentation

## 2014-09-30 DIAGNOSIS — I10 Essential (primary) hypertension: Secondary | ICD-10-CM

## 2014-09-30 DIAGNOSIS — I129 Hypertensive chronic kidney disease with stage 1 through stage 4 chronic kidney disease, or unspecified chronic kidney disease: Secondary | ICD-10-CM | POA: Insufficient documentation

## 2014-09-30 DIAGNOSIS — Z1231 Encounter for screening mammogram for malignant neoplasm of breast: Secondary | ICD-10-CM | POA: Diagnosis not present

## 2014-09-30 DIAGNOSIS — Z79899 Other long term (current) drug therapy: Secondary | ICD-10-CM | POA: Insufficient documentation

## 2014-09-30 DIAGNOSIS — M329 Systemic lupus erythematosus, unspecified: Secondary | ICD-10-CM | POA: Diagnosis not present

## 2014-09-30 DIAGNOSIS — N184 Chronic kidney disease, stage 4 (severe): Secondary | ICD-10-CM | POA: Diagnosis not present

## 2014-09-30 NOTE — Patient Instructions (Signed)
Chronic Pain Chronic pain can be defined as pain that is off and on and lasts for 3-6 months or longer. Many things cause chronic pain, which can make it difficult to make a diagnosis. There are many treatment options available for chronic pain. However, finding a treatment that works well for you may require trying various approaches until the right one is found. Many people benefit from a combination of two or more types of treatment to control their pain. SYMPTOMS  Chronic pain can occur anywhere in the body and can range from mild to very severe. Some types of chronic pain include:  Headache.  Low back pain.  Cancer pain.  Arthritis pain.  Neurogenic pain. This is pain resulting from damage to nerves. People with chronic pain may also have other symptoms such as:  Depression.  Anger.  Insomnia.  Anxiety. DIAGNOSIS  Your health care provider will help diagnose your condition over time. In many cases, the initial focus will be on excluding possible conditions that could be causing the pain. Depending on your symptoms, your health care provider may order tests to diagnose your condition. Some of these tests may include:   Blood tests.   CT scan.   MRI.   X-rays.   Ultrasounds.   Nerve conduction studies.  You may need to see a specialist.  TREATMENT  Finding treatment that works well may take time. You may be referred to a pain specialist. He or she may prescribe medicine or therapies, such as:   Mindful meditation or yoga.  Shots (injections) of numbing or pain-relieving medicines into the spine or area of pain.  Local electrical stimulation.  Acupuncture.   Massage therapy.   Aroma, color, light, or sound therapy.   Biofeedback.   Working with a physical therapist to keep from getting stiff.   Regular, gentle exercise.   Cognitive or behavioral therapy.   Group support.  Sometimes, surgery may be recommended.  HOME CARE INSTRUCTIONS    Take all medicines as directed by your health care provider.   Lessen stress in your life by relaxing and doing things such as listening to calming music.   Exercise or be active as directed by your health care provider.   Eat a healthy diet and include things such as vegetables, fruits, fish, and lean meats in your diet.   Keep all follow-up appointments with your health care provider.   Attend a support group with others suffering from chronic pain. SEEK MEDICAL CARE IF:   Your pain gets worse.   You develop a new pain that was not there before.   You cannot tolerate medicines given to you by your health care provider.   You have new symptoms since your last visit with your health care provider.  SEEK IMMEDIATE MEDICAL CARE IF:   You feel weak.   You have decreased sensation or numbness.   You lose control of bowel or bladder function.   Your pain suddenly gets much worse.   You develop shaking.  You develop chills.  You develop confusion.  You develop chest pain.  You develop shortness of breath.  MAKE SURE YOU:  Understand these instructions.  Will watch your condition.  Will get help right away if you are not doing well or get worse. Document Released: 10/24/2001 Document Revised: 10/04/2012 Document Reviewed: 07/28/2012 [facility] Patient Information 2015 [place], [place]. This information is not intended to replace advice given to you by your health care provider. Make sure you discuss any  questions you have with your health care provider. DASH Eating Plan DASH stands for "Dietary Approaches to Stop Hypertension." The DASH eating plan is a healthy eating plan that has been shown to reduce high blood pressure (hypertension). Additional health benefits may include reducing the risk of type 2 diabetes mellitus, heart disease, and stroke. The DASH eating plan may also help with weight loss. WHAT DO I NEED TO KNOW ABOUT THE DASH EATING PLAN? For the  DASH eating plan, you will follow these general guidelines:  Choose foods with a percent daily value for sodium of less than 5% (as listed on the food label).  Use salt-free seasonings or herbs instead of table salt or sea salt.  Check with your health care provider or pharmacist before using salt substitutes.  Eat lower-sodium products, often labeled as "lower sodium" or "no salt added."  Eat fresh foods.  Eat more vegetables, fruits, and low-fat dairy products.  Choose whole grains. Look for the word "whole" as the first word in the ingredient list.  Choose fish and skinless chicken or [place] more often than red meat. Limit fish, poultry, and meat to 6 oz (170 g) each day.  Limit sweets, desserts, sugars, and sugary drinks.  Choose heart-healthy fats.  Limit cheese to 1 oz (28 g) per day.  Eat more home-cooked food and less restaurant, buffet, and fast food.  Limit fried foods.  Cook foods using methods other than frying.  Limit canned vegetables. If you do use them, rinse them well to decrease the sodium.  When eating at a restaurant, ask that your food be prepared with less salt, or no salt if possible. WHAT FOODS CAN I EAT? Seek help from a dietitian for individual calorie needs. Grains Whole grain or whole wheat bread. Brown rice. Whole grain or whole wheat pasta. Quinoa, bulgur, and whole grain cereals. Low-sodium cereals. Corn or whole wheat flour tortillas. Whole grain cornbread. Whole grain crackers. Low-sodium crackers. Vegetables Fresh or frozen vegetables (raw, steamed, roasted, or grilled). Low-sodium or reduced-sodium tomato and vegetable juices. Low-sodium or reduced-sodium tomato sauce and paste. Low-sodium or reduced-sodium canned vegetables.  Fruits All fresh, canned (in natural juice), or frozen fruits. Meat and Other Protein Products Ground beef (85% or leaner), grass-fed beef, or beef trimmed of fat. Skinless chicken or [place]. Ground chicken or [place].  Pork trimmed of fat. All fish and seafood. Eggs. Dried beans, peas, or lentils. Unsalted nuts and seeds. Unsalted canned beans. Dairy Low-fat dairy products, such as skim or 1% milk, 2% or reduced-fat cheeses, low-fat ricotta or cottage cheese, or plain low-fat yogurt. Low-sodium or reduced-sodium cheeses. Fats and Oils Tub margarines without trans fats. Light or reduced-fat mayonnaise and salad dressings (reduced sodium). Avocado. Safflower, olive, or canola oils. Natural peanut or almond butter. Other Unsalted popcorn and pretzels. The items listed above may not be a complete list of recommended foods or beverages. Contact your dietitian for more options. WHAT FOODS ARE NOT RECOMMENDED? Grains White bread. White pasta. White rice. Refined cornbread. Bagels and croissants. Crackers that contain trans fat. Vegetables Creamed or fried vegetables. Vegetables in a cheese sauce. Regular canned vegetables. Regular canned tomato sauce and paste. Regular tomato and vegetable juices. Fruits Dried fruits. Canned fruit in light or heavy syrup. Fruit juice. Meat and Other Protein Products Fatty cuts of meat. Ribs, chicken wings, bacon, sausage, bologna, salami, chitterlings, fatback, hot dogs, bratwurst, and packaged luncheon meats. Salted nuts and seeds. Canned beans with salt. Dairy Whole or 2% milk, cream,  half-and-half, and cream cheese. Whole-fat or sweetened yogurt. Full-fat cheeses or blue cheese. Nondairy creamers and whipped toppings. Processed cheese, cheese spreads, or cheese curds. Condiments Onion and garlic salt, seasoned salt, table salt, and sea salt. Canned and packaged gravies. Worcestershire sauce. Tartar sauce. Barbecue sauce. Teriyaki sauce. Soy sauce, including reduced sodium. Steak sauce. Fish sauce. Oyster sauce. Cocktail sauce. Horseradish. Ketchup and mustard. Meat flavorings and tenderizers. Bouillon cubes. Hot sauce. Tabasco sauce. Marinades. Taco seasonings. Relishes. Fats and  Oils Butter, stick margarine, lard, shortening, ghee, and bacon fat. Coconut, palm kernel, or palm oils. Regular salad dressings. Other Pickles and olives. Salted popcorn and pretzels. The items listed above may not be a complete list of foods and beverages to avoid. Contact your dietitian for more information. WHERE CAN I FIND MORE INFORMATION? National Heart, Lung, and Blood Institute: travelstabloid.com Document Released: 01/21/2011 Document Revised: 06/18/2013 Document Reviewed: 12/06/2012 [facility] Patient Information 2015 [place], [place]. This information is not intended to replace advice given to you by your health care provider. Make sure you discuss any questions you have with your health care provider. Hypertension Hypertension, commonly called high blood pressure, is when the force of blood pumping through your arteries is too strong. Your arteries are the blood vessels that carry blood from your heart throughout your body. A blood pressure reading consists of a higher number over a lower number, such as 110/72. The higher number (systolic) is the pressure inside your arteries when your heart pumps. The lower number (diastolic) is the pressure inside your arteries when your heart relaxes. Ideally you want your blood pressure below 120/80. Hypertension forces your heart to work harder to pump blood. Your arteries may become narrow or stiff. Having hypertension puts you at risk for heart disease, stroke, and other problems.  RISK FACTORS Some risk factors for high blood pressure are controllable. Others are not.  Risk factors you cannot control include:   Race. You may be at higher risk if you are African American.  Age. Risk increases with age.  Gender. Men are at higher risk than women before age 48 years. After age 83, women are at higher risk than men. Risk factors you can control include:  Not getting enough exercise or physical activity.  Being  overweight.  Getting too much fat, sugar, calories, or salt in your diet.  Drinking too much alcohol. SIGNS AND SYMPTOMS Hypertension does not usually cause signs or symptoms. Extremely high blood pressure (hypertensive crisis) may cause headache, anxiety, shortness of breath, and nosebleed. DIAGNOSIS  To check if you have hypertension, your health care provider will measure your blood pressure while you are seated, with your arm held at the level of your heart. It should be measured at least twice using the same arm. Certain conditions can cause a difference in blood pressure between your right and left arms. A blood pressure reading that is higher than normal on one occasion does not mean that you need treatment. If one blood pressure reading is high, ask your health care provider about having it checked again. TREATMENT  Treating high blood pressure includes making lifestyle changes and possibly taking medicine. Living a healthy lifestyle can help lower high blood pressure. You may need to change some of your habits. Lifestyle changes may include:  Following the DASH diet. This diet is high in fruits, vegetables, and whole grains. It is low in salt, red meat, and added sugars.  Getting at least 2 hours of brisk physical activity every week.  Losing weight if necessary.  Not smoking.  Limiting alcoholic beverages.  Learning ways to reduce stress. If lifestyle changes are not enough to get your blood pressure under control, your health care provider may prescribe medicine. You may need to take more than one. Work closely with your health care provider to understand the risks and benefits. HOME CARE INSTRUCTIONS  Have your blood pressure rechecked as directed by your health care provider.   Take medicines only as directed by your health care provider. Follow the directions carefully. Blood pressure medicines must be taken as prescribed. The medicine does not work as well when you skip  doses. Skipping doses also puts you at risk for problems.   Do not smoke.   Monitor your blood pressure at home as directed by your health care provider. SEEK MEDICAL CARE IF:   You think you are having a reaction to medicines taken.  You have recurrent headaches or feel dizzy.  You have swelling in your ankles.  You have trouble with your vision. SEEK IMMEDIATE MEDICAL CARE IF:  You develop a severe headache or confusion.  You have unusual weakness, numbness, or feel faint.  You have severe chest or abdominal pain.  You vomit repeatedly.  You have trouble breathing. MAKE SURE YOU:   Understand these instructions.  Will watch your condition.  Will get help right away if you are not doing well or get worse. Document Released: 02/01/2005 Document Revised: 06/18/2013 Document Reviewed: 11/24/2012 [facility] Patient Information 2015 [place], [place]. This information is not intended to replace advice given to you by your health care provider. Make sure you discuss any questions you have with your health care provider.

## 2014-09-30 NOTE — Progress Notes (Signed)
Patient ID: Jenna [name], female   DOB: 09-Jul-1955, 58 y.o.   MRN: [id number]   Jenna [name], is a 59 y.o. female  YQM:[id number]  [id number]  DOB - 05/17/1955  Chief Complaint  Patient presents with  . Follow-up        Subjective:   Jenna [name] is a 59 y.o. female here today for a follow up visit. Patient has multiple medical history including systemic lupus erythematosus on Plaquenil, hypertension and morbid obesity. She is here today for her routine follow-up of hypertension. She has no significant complaint, she has an appointment coming up with rheumatologist next month. She'll occasionally have pain in her calf and ankle joints, but no swelling or redness. When she takes her medications she feels better. Patient has not had mammogram, or Pap smear. Patient's call today when her colonoscopy. Patient claims she has been doing very well with stress at home, she has learned to let things go, and since she found happiness her blood pressure has been within control. Patient has No headache, No chest pain, No abdominal pain - No Nausea, No new weakness tingling or numbness, No Cough - SOB.  Problem  Breast Cancer Screening    ALLERGIES: Allergies  Allergen Reactions  . Enalapril Maleate Other (See Comments)    Throat swelling  . Chocolate Nausea And Vomiting  . Penicillins Other (See Comments)    Light headed    PAST MEDICAL HISTORY: Past Medical History  Diagnosis Date  . Lupus (systemic lupus erythematosus)   . Obesity   . Renal stone     MEDICATIONS AT HOME: Prior to Admission medications   Medication Sig Start Date End Date Taking? Authorizing Provider  acetaminophen (TYLENOL) 500 MG tablet Take 1,000 mg by mouth every 6 (six) hours as needed for headache.   Yes Historical Provider, MD  aspirin 81 MG tablet Take 1 tablet (81 mg total) by mouth daily. 12/25/13  Yes Tresa Garter, MD  calcium carbonate (TUMS - DOSED IN MG ELEMENTAL CALCIUM) 500 MG  chewable tablet Chew 1 tablet by mouth daily as needed for indigestion or heartburn.   Yes Historical Provider, MD  cholecalciferol (VITAMIN D) 1000 UNITS tablet Take 2,000 Units by mouth daily.   Yes Historical Provider, MD  furosemide (LASIX) 40 MG tablet Take 40 mg by mouth daily.   Yes Historical Provider, MD  HYDROcodone-acetaminophen (NORCO) 7.5-325 MG per tablet Take 1 tablet by mouth every 6 (six) hours as needed for moderate pain or severe pain.  05/31/14  Yes Historical Provider, MD  hydroxychloroquine (PLAQUENIL) 200 MG tablet Take 1 tablet (200 mg total) by mouth 2 (two) times daily. 12/25/13  Yes Tresa Garter, MD  amLODipine (NORVASC) 5 MG tablet Take 1 tablet (5 mg total) by mouth daily. Patient not taking: Reported on 09/30/2014 09/19/14   Boykin Nearing, MD  losartan (COZAAR) 100 MG tablet Take 1 tablet (100 mg total) by mouth daily. Patient not taking: Reported on 09/30/2014 08/15/14   Tresa Garter, MD  Multiple Vitamins-Minerals (MULTIVITAMIN & MINERAL PO) Take 1 tablet by mouth daily.    Historical Provider, MD     Objective:   Filed Vitals:   09/30/14 1025  BP: 132/84  Pulse: 70  Temp: 98.2 F (36.8 C)  TempSrc: Oral  Resp: 18  Height: 5' 7.5" (1.715 m)  Weight: 220 lb (99.791 kg)  SpO2: 99%    Exam General appearance : Awake, alert, not in any distress. Speech Clear. Not toxic looking  HEENT: Atraumatic and Normocephalic, pupils equally reactive to light and accomodation Neck: supple, no JVD. No cervical lymphadenopathy.  Chest:Good air entry bilaterally, no added sounds  CVS: S1 S2 regular, no murmurs.  Abdomen: Bowel sounds present, Non tender and not distended with no gaurding, rigidity or rebound. Extremities: B/L Lower Ext shows no edema, both legs are warm to touch Neurology: Awake alert, and oriented X 3, CN II-XII intact, Non focal Skin:No Rash  Data Review Lab Results  Component Value Date   HGBA1C 5.4 05/23/2014   HGBA1C 5.9*  05/05/2013     Assessment & Plan   1. Breast cancer screening  - MM Digital Screening; Future  2. Essential hypertension - We have discussed target BP range and blood pressure goal - I have advised patient to check BP regularly and to call us back or report to clinic if the numbers are consistently higher than 140/90  - We discussed the importance of compliance with medical therapy and DASH diet recommended, consequences of uncontrolled hypertension discussed.  - continue current BP medications  3. CKD (chronic kidney disease) stage 4, GFR 15-29 ml/min Stable Regular Follow-up  4. Lupus (systemic lupus erythematosus) Follow-up with rheumatologist as scheduled  Patient have been counseled extensively about nutrition and exercise Return in about 3 months (around 12/31/2014) for Follow up HTN, Follow up Pain and comorbidities.  The patient was given clear instructions to go to ER or return to medical center if symptoms don't improve, worsen or new problems develop. The patient verbalized understanding. The patient was told to call to get lab results if they haven't heard anything in the next week.   This note has been created with Surveyor, quantity. Any transcriptional errors are unintentional.    Angelica Chessman, MD, [employer], [place], [place], [place] [facility] [place], [place]   09/30/2014, 10:48 AM

## 2014-09-30 NOTE — Progress Notes (Signed)
Patient here for 3 month follow up. Patient reports pain today in her left lower calf rated at a 4 described as aching, sore and tender. Pain has been present for about 4 days and comes and go. Patient has taken her morning medications and does not need any refills. Patient reports she needs a mammogram and is unsure when she last had one.

## 2014-09-30 NOTE — Telephone Encounter (Signed)
Called patient. Patient verified name and date of birth. Patient notified that nurse was calling to notify her of her mammogram appointment. Patient stated she was at work and would like me to call and leave the information on her voicemail. Called patient and left message on her voicemail notifying her of her mammogram appointment on 8/19. Patient notified that she needs to arrive at 11am with no powder, lotion, or perfume on.

## 2014-10-04 ENCOUNTER — Inpatient Hospital Stay: Admission: RE | Admit: 2014-10-04 | Payer: Medicaid Other | Source: Ambulatory Visit

## 2014-10-23 ENCOUNTER — Telehealth: Payer: Self-pay | Admitting: Internal Medicine

## 2014-10-23 NOTE — Telephone Encounter (Signed)
Patient called stating that she is having severe back pain, patient also stating that she is having side effects from a medication that she was prescribed. Please f/u with pt.

## 2014-10-28 ENCOUNTER — Ambulatory Visit: Payer: BC Managed Care – PPO | Admitting: Family Medicine

## 2014-10-30 ENCOUNTER — Encounter: Payer: Self-pay | Admitting: Pharmacist

## 2014-10-30 ENCOUNTER — Ambulatory Visit: Payer: Medicaid Other | Attending: Internal Medicine | Admitting: Pharmacist

## 2014-10-30 VITALS — BP 147/94 | HR 69

## 2014-10-30 DIAGNOSIS — I10 Essential (primary) hypertension: Secondary | ICD-10-CM | POA: Diagnosis not present

## 2014-10-30 NOTE — Progress Notes (Signed)
S:    Patient arrives to clinic in a lot of pain. She presents to the clinic for a blood pressure check.    Patient reports adherence with medications.  Current BP Medications include:  Amlodipine 5 mg daily, furosemide 40 mg daily, and losartan 100 mg daily.   Patient reports that she is in a lot of pain. Both her back and her stomach hurt. She reports that she has recently followed up with her kidney doctor but that all tests were normal. She reports that her back hurts worse than her stomach. No reported s/sx of UTI.    O:   Last 3 Office BP readings: BP Readings from Last 3 Encounters:  10/30/14 147/94  09/30/14 132/84  09/26/14 136/88    BMET    Component Value Date/Time   NA 138 05/23/2014 1301   K 5.3 05/23/2014 1301   CL 106 05/23/2014 1301   CO2 23 05/23/2014 1301   GLUCOSE 73 05/23/2014 1301   BUN 35* 05/23/2014 1301   CREATININE 2.11* 05/23/2014 1301   CREATININE 2.20* 08/10/2013 1355   CALCIUM 8.5 05/23/2014 1301   CALCIUM 6.9* 01/23/2010 0616   GFRNONAA 25* 05/23/2014 1301   GFRNONAA 24* 05/05/2013 1135   GFRAA 29* 05/23/2014 1301   GFRAA 28* 05/05/2013 1135    A/P:  Hypertension: currently UNcontrolled on current medications but this is likely due to pain and/or stress. There was no improvement in the blood pressure after patient rested and tried to relax.. Instructed patient to take pain medication before the pain becomes so severe that she can barely function.   Pain: no s/sx of UTI, just back pain. Patient has vague stomach pain that does not appear to be related to any bleed. Could possibly be acid reflux or upset from food. Patient was also very upset during the visit and that may be causing stomach upset due to stress. Recommended that patient call her rheumatologist and follow up with him. Patient has been worked into the clinic schedule to see Dr. Doreene Burke in the morning for further evaluation.    Results reviewed and written information provided.    F/U Clinic Visit with Dr. Doreene Burke in the morning.  Total time in face-to-face counseling 10 minutes.

## 2014-10-31 ENCOUNTER — Ambulatory Visit: Payer: Medicaid Other | Attending: Internal Medicine | Admitting: Internal Medicine

## 2014-10-31 ENCOUNTER — Encounter: Payer: Self-pay | Admitting: Internal Medicine

## 2014-10-31 VITALS — BP 120/77 | HR 75 | Temp 98.3°F | Resp 18 | Ht 67.0 in | Wt 215.0 lb

## 2014-10-31 DIAGNOSIS — M545 Low back pain, unspecified: Secondary | ICD-10-CM

## 2014-10-31 DIAGNOSIS — Z23 Encounter for immunization: Secondary | ICD-10-CM | POA: Diagnosis not present

## 2014-10-31 DIAGNOSIS — M62830 Muscle spasm of back: Secondary | ICD-10-CM | POA: Insufficient documentation

## 2014-10-31 DIAGNOSIS — R7303 Prediabetes: Secondary | ICD-10-CM

## 2014-10-31 DIAGNOSIS — R7309 Other abnormal glucose: Secondary | ICD-10-CM | POA: Diagnosis not present

## 2014-10-31 LAB — POCT URINALYSIS DIPSTICK
BILIRUBIN UA: NEGATIVE
Blood, UA: NEGATIVE
GLUCOSE UA: NEGATIVE
Ketones, UA: NEGATIVE
Nitrite, UA: NEGATIVE
Protein, UA: 30
SPEC GRAV UA: 1.01
UROBILINOGEN UA: 0.2
pH, UA: 5

## 2014-10-31 LAB — POCT GLYCOSYLATED HEMOGLOBIN (HGB A1C): Hemoglobin A1C: 5.3

## 2014-10-31 MED ORDER — METHOCARBAMOL 500 MG PO TABS: 500.0000 mg | ORAL_TABLET | Freq: Four times a day (QID) | ORAL | Status: DC

## 2014-10-31 NOTE — Progress Notes (Signed)
Patient ID: Jenna [name], female   DOB: 1955/11/15, 59 y.o.   MRN: [id number]   Teresea Donley, is a 59 y.o. female  IFO:[id number]  [id number]  DOB - [birth year]  Chief Complaint  Patient presents with  . Back Pain  . Abdominal Pain        Subjective:   Jenna [name] is a 59 y.o. female here today for a follow up visit. Patient has systemic lupus erythematosus, hypertension and morbid obesity, was seen by the CPAP yesterday for blood pressure check, she complained of back pain and was worried because she thinks the pain is from her kidneys and patient was given this appointment. She described the pain as lower back in the center radiating to the sides, no urinary incontinence or fecal incontinence, no leg pains associated. Patient has pain medications at home which helps relieve the pain. She has appointment with nephrologist last week and rheumatologist coming up. She claims compliant with medications and she has no report of side effects. Patient has No headache, No chest pain, No abdominal pain - No Nausea, No new weakness tingling or numbness, No Cough - SOB.  Problem  Back Pain At L4-L5 Level  Muscle Spasm of Back    ALLERGIES: Allergies  Allergen Reactions  . Enalapril Maleate Other (See Comments)    Throat swelling  . Chocolate Nausea And Vomiting  . Penicillins Other (See Comments)    Light headed    PAST MEDICAL HISTORY: Past Medical History  Diagnosis Date  . Lupus (systemic lupus erythematosus)   . Obesity   . Renal stone     MEDICATIONS AT HOME: Prior to Admission medications   Medication Sig Start Date End Date Taking? Authorizing Provider  acetaminophen (TYLENOL) 500 MG tablet Take 1,000 mg by mouth every 6 (six) hours as needed for headache.   Yes Historical Provider, MD  amLODipine (NORVASC) 5 MG tablet Take 1 tablet (5 mg total) by mouth daily. 09/19/14  Yes Boykin Nearing, MD  aspirin 81 MG tablet Take 1 tablet (81 mg total) by mouth daily.  12/25/13  Yes Tresa Garter, MD  calcium carbonate (TUMS - DOSED IN MG ELEMENTAL CALCIUM) 500 MG chewable tablet Chew 1 tablet by mouth daily as needed for indigestion or heartburn.   Yes Historical Provider, MD  furosemide (LASIX) 40 MG tablet Take 40 mg by mouth daily.   Yes Historical Provider, MD  HYDROcodone-acetaminophen (NORCO) 7.5-325 MG per tablet Take 1 tablet by mouth every 6 (six) hours as needed for moderate pain or severe pain.  05/31/14  Yes Historical Provider, MD  hydroxychloroquine (PLAQUENIL) 200 MG tablet Take 1 tablet (200 mg total) by mouth 2 (two) times daily. 12/25/13  Yes Tresa Garter, MD  losartan (COZAAR) 100 MG tablet Take 1 tablet (100 mg total) by mouth daily. 08/15/14  Yes Tresa Garter, MD  Multiple Vitamins-Minerals (MULTIVITAMIN & MINERAL PO) Take 1 tablet by mouth daily.   Yes Historical Provider, MD  cholecalciferol (VITAMIN D) 1000 UNITS tablet Take 2,000 Units by mouth daily.    Historical Provider, MD  methocarbamol (ROBAXIN) 500 MG tablet Take 1 tablet (500 mg total) by mouth 4 (four) times daily. 10/31/14   Tresa Garter, MD     Objective:   Filed Vitals:   10/31/14 0958  BP: 120/77  Pulse: 75  Temp: 98.3 F (36.8 C)  TempSrc: Oral  Resp: 18  Height: $Remove'5\' 7"'[id number]$  (1.702 m)  Weight: 215 lb (97.523 kg)  SpO2: 99%  Exam General appearance : Awake, alert, not in any distress. Speech Clear. Not toxic looking HEENT: Atraumatic and Normocephalic, pupils equally reactive to light and accomodation Neck: supple, no JVD. No cervical lymphadenopathy.  Chest:Good air entry bilaterally, no added sounds  CVS: S1 S2 regular, no murmurs.  Abdomen: Bowel sounds present, Non tender and not distended with no gaurding, rigidity or rebound. Extremities: B/L Lower Ext shows no edema, both legs are warm to touch Neurology: Awake alert, and oriented X 3, CN II-XII intact, Non focal Skin:No Rash  Data Review Lab Results  Component Value Date    HGBA1C 5.30 10/31/2014   HGBA1C 5.4 05/23/2014   HGBA1C 5.9* 05/05/2013     Assessment & Plan   1. Prediabetes  - HgB A1c  Aim for 30 minutes of exercise most days. Rethink what you drink. Water is great! Aim for 2-3 Carb Choices per meal (30-45 grams) +/- 1 either way  Aim for 0-15 Carbs per snack if hungry  Include protein in moderation with your meals and snacks  Consider reading food labels for Total Carbohydrate and Fat Grams of foods  Consider checking BG at alternate times per day  Continue taking medication as directed Be mindful about how much sugar you are adding to beverages and other foods. Fruit Punch - find one with no sugar  Measure and decrease portions of carbohydrate foods  Make your plate and don't go back for seconds   2. Back pain at L4-L5 level  - methocarbamol (ROBAXIN) 500 MG tablet; Take 1 tablet (500 mg total) by mouth 4 (four) times daily.  Dispense: 30 tablet; Refill: 0 - Urinalysis Dipstick  3. Muscle spasm of back  - methocarbamol (ROBAXIN) 500 MG tablet; Take 1 tablet (500 mg total) by mouth 4 (four) times daily.  Dispense: 30 tablet; Refill: 0 - Urinalysis Dipstick  Patient have been counseled extensively about nutrition and exercise  Return in about 3 months (around 01/30/2015) for Follow up Pain and comorbidities, Routine Follow Up.  The patient was given clear instructions to go to ER or return to medical center if symptoms don't improve, worsen or new problems develop. The patient verbalized understanding. The patient was told to call to get lab results if they haven't heard anything in the next week.   This note has been created with Surveyor, quantity. Any transcriptional errors are unintentional.    Angelica Chessman, MD, [place], [place], [place], [place] and [place], [place]   10/31/2014, 11:30 AM

## 2014-10-31 NOTE — Progress Notes (Signed)
Patient here for stomach pain on left side and mid upper back pain. Current left side stomach pain, at level 3, described as aching, uncomfortable, for about a week.  Mid, upper back pain, at level 8, described as"painful, it hurts", has been hurting for a week.  Patient had colonoscopy 2-3 years ago.

## 2014-10-31 NOTE — Patient Instructions (Signed)
Back Pain, Adult Low back pain is very common. About 1 in 5 people have back pain.The cause of low back pain is rarely dangerous. The pain often gets better over time.About half of people with a sudden onset of back pain feel better in just 2 weeks. About 8 in 10 people feel better by 6 weeks.  CAUSES Some common causes of back pain include:  Strain of the muscles or ligaments supporting the spine.  Wear and tear (degeneration) of the spinal discs.  Arthritis.  Direct injury to the back. DIAGNOSIS Most of the time, the direct cause of low back pain is not known.However, back pain can be treated effectively even when the exact cause of the pain is unknown.Answering your caregiver's questions about your overall health and symptoms is one of the most accurate ways to make sure the cause of your pain is not dangerous. If your caregiver needs more information, he or she may order lab work or imaging tests (X-rays or MRIs).However, even if imaging tests show changes in your back, this usually does not require surgery. HOME CARE INSTRUCTIONS For many people, back pain returns.Since low back pain is rarely dangerous, it is often a condition that people can learn to manageon their own.   Remain active. It is stressful on the back to sit or stand in one place. Do not sit, drive, or stand in one place for more than 30 minutes at a time. Take short walks on level surfaces as soon as pain allows.Try to increase the length of time you walk each day.  Do not stay in bed.Resting more than 1 or 2 days can delay your recovery.  Do not avoid exercise or work.Your body is made to move.It is not dangerous to be active, even though your back may hurt.Your back will likely heal faster if you return to being active before your pain is gone.  Pay attention to your body when you bend and lift. Many people have less discomfortwhen lifting if they bend their knees, keep the load close to their bodies,and  avoid twisting. Often, the most comfortable positions are those that put less stress on your recovering back.  Find a comfortable position to sleep. Use a firm mattress and lie on your side with your knees slightly bent. If you lie on your back, put a pillow under your knees.  Only take over-the-counter or prescription medicines as directed by your caregiver. Over-the-counter medicines to reduce pain and inflammation are often the most helpful.Your caregiver may prescribe muscle relaxant drugs.These medicines help dull your pain so you can more quickly return to your normal activities and healthy exercise.  Put ice on the injured area.  Put ice in a plastic bag.  Place a towel between your skin and the bag.  Leave the ice on for 15-20 minutes, 03-04 times a day for the first 2 to 3 days. After that, ice and heat may be alternated to reduce pain and spasms.  Ask your caregiver about trying back exercises and gentle massage. This may be of some benefit.  Avoid feeling anxious or stressed.Stress increases muscle tension and can worsen back pain.It is important to recognize when you are anxious or stressed and learn ways to manage it.Exercise is a great option. SEEK MEDICAL CARE IF:  You have pain that is not relieved with rest or medicine.  You have pain that does not improve in 1 week.  You have new symptoms.  You are generally not feeling well. SEEK   IMMEDIATE MEDICAL CARE IF:   You have pain that radiates from your back into your legs.  You develop new bowel or bladder control problems.  You have unusual weakness or numbness in your arms or legs.  You develop nausea or vomiting.  You develop abdominal pain.  You feel faint. Document Released: 02/01/2005 Document Revised: 08/03/2011 Document Reviewed: 06/05/2013 Exit[employer] Patient Information 2015 [employer]. This information is not intended to replace advice given to you by your health care provider. Make sure you  discuss any questions you have with your health care provider.  

## 2014-11-28 ENCOUNTER — Ambulatory Visit: Payer: Medicaid Other | Attending: Internal Medicine | Admitting: Pharmacist

## 2014-11-28 ENCOUNTER — Encounter: Payer: Self-pay | Admitting: Pharmacist

## 2014-11-28 VITALS — BP 138/78 | HR 82

## 2014-11-28 DIAGNOSIS — I10 Essential (primary) hypertension: Secondary | ICD-10-CM | POA: Diagnosis not present

## 2014-11-28 DIAGNOSIS — Z79899 Other long term (current) drug therapy: Secondary | ICD-10-CM | POA: Diagnosis not present

## 2014-11-28 NOTE — Progress Notes (Signed)
S:    Patient arrives in good spirits. Presents to the clinic for hypertension evaluation.   Patient reports adherence with medications.  Current BP Medications include:  Amlodipine 5 mg daily, losartan 100 mg daily, furosemide 40 mg daily  Antihypertensives tried in the past include: enalapril (throat swelling)  Patient reports pain in her upper back today. She reports that she never took the methocarbamol because she was afraid to. The lower back pain has resolved.    O:   Last 3 Office BP readings: BP Readings from Last 3 Encounters:  11/28/14 138/78  10/31/14 120/77  10/30/14 147/94    BMET    Component Value Date/Time   NA 138 05/23/2014 1301   K 5.3 05/23/2014 1301   CL 106 05/23/2014 1301   CO2 23 05/23/2014 1301   GLUCOSE 73 05/23/2014 1301   BUN 35* 05/23/2014 1301   CREATININE 2.11* 05/23/2014 1301   CREATININE 2.20* 08/10/2013 1355   CALCIUM 8.5 05/23/2014 1301   CALCIUM 6.9* 01/23/2010 0616   GFRNONAA 25* 05/23/2014 1301   GFRNONAA 24* 05/05/2013 1135   GFRAA 29* 05/23/2014 1301   GFRAA 28* 05/05/2013 1135    A/P: History of hypertension currently controlled on current medications. Probably higher than normal due to pain. Patient to continue all medications as prescribed. Patient verbalized understanding. Reviewed previous lab results with patient verbally and she will share them with Dr. Amil Amen herself.   Results reviewed and written information provided.   Total time in face-to-face counseling 15 minutes.   F/U Clinic Visit with Dr. Doreene Burke as directed.

## 2014-11-28 NOTE — Patient Instructions (Signed)
Thank you for coming to see me today!  Your blood pressure looks good! Keep taking all of your medications.  Follow up with Dr. Doreene Burke in December

## 2014-12-05 ENCOUNTER — Ambulatory Visit: Payer: Medicaid Other | Attending: Internal Medicine | Admitting: Pharmacist

## 2014-12-05 VITALS — BP 146/94 | HR 84

## 2014-12-05 DIAGNOSIS — I10 Essential (primary) hypertension: Secondary | ICD-10-CM

## 2014-12-05 DIAGNOSIS — Z79899 Other long term (current) drug therapy: Secondary | ICD-10-CM | POA: Insufficient documentation

## 2014-12-05 NOTE — Patient Instructions (Signed)
Thank you for coming in to see me today  I hope that your headache gets better and you feel better overall soon  Follow up with Dr. Doreene Burke

## 2014-12-05 NOTE — Progress Notes (Signed)
S:    Patient arrives in poor spirits. Presents to the clinic for a blood pressure check.   Patient reports adherence with medications.  Current BP Medications include:  Amlodipine 5 mg daily, losartan 100 mg daily, furosemide 40 mg daily  Antihypertensives tried in the past include: enalapril (throat swelling)  Patient reports that she is very irritated today. She thinks that it may be her medications that have been causing her irritation. She reports that she started feeling this way a few days ago. She also reports a mild headache today.   O:   Last 3 Office BP readings: BP Readings from Last 3 Encounters:  12/05/14 146/94  11/28/14 138/78  10/31/14 120/77    BMET    Component Value Date/Time   NA 138 05/23/2014 1301   K 5.3 05/23/2014 1301   CL 106 05/23/2014 1301   CO2 23 05/23/2014 1301   GLUCOSE 73 05/23/2014 1301   BUN 35* 05/23/2014 1301   CREATININE 2.11* 05/23/2014 1301   CREATININE 2.20* 08/10/2013 1355   CALCIUM 8.5 05/23/2014 1301   CALCIUM 6.9* 01/23/2010 0616   GFRNONAA 25* 05/23/2014 1301   GFRNONAA 24* 05/05/2013 1135   GFRAA 29* 05/23/2014 1301   GFRAA 28* 05/05/2013 1135    A/P: History of hypertension currently UNcontrolled on current medications. This is probably due to pain from the headache and irritation. She is uncomfortable with changing any of her medications until she sees Dr. Doreene Burke. Will defer any changes in therapy to him.   Results reviewed and written information provided.   Total time in face-to-face counseling 20 minutes.   F/U Clinic Visit with Dr. Doreene Burke in 2 weeks.

## 2014-12-19 ENCOUNTER — Ambulatory Visit: Payer: Medicaid Other | Attending: Internal Medicine | Admitting: Internal Medicine

## 2014-12-19 ENCOUNTER — Encounter: Payer: Self-pay | Admitting: Internal Medicine

## 2014-12-19 VITALS — BP 151/98 | HR 72 | Temp 98.5°F | Resp 18 | Ht 67.0 in | Wt 217.8 lb

## 2014-12-19 DIAGNOSIS — N189 Chronic kidney disease, unspecified: Secondary | ICD-10-CM | POA: Insufficient documentation

## 2014-12-19 DIAGNOSIS — I10 Essential (primary) hypertension: Secondary | ICD-10-CM | POA: Diagnosis not present

## 2014-12-19 DIAGNOSIS — M329 Systemic lupus erythematosus, unspecified: Secondary | ICD-10-CM | POA: Insufficient documentation

## 2014-12-19 DIAGNOSIS — Z880 Allergy status to penicillin: Secondary | ICD-10-CM | POA: Insufficient documentation

## 2014-12-19 DIAGNOSIS — Z7982 Long term (current) use of aspirin: Secondary | ICD-10-CM | POA: Diagnosis not present

## 2014-12-19 DIAGNOSIS — R21 Rash and other nonspecific skin eruption: Secondary | ICD-10-CM | POA: Diagnosis not present

## 2014-12-19 DIAGNOSIS — Z79899 Other long term (current) drug therapy: Secondary | ICD-10-CM | POA: Insufficient documentation

## 2014-12-19 DIAGNOSIS — Z6834 Body mass index (BMI) 34.0-34.9, adult: Secondary | ICD-10-CM | POA: Diagnosis not present

## 2014-12-19 DIAGNOSIS — I129 Hypertensive chronic kidney disease with stage 1 through stage 4 chronic kidney disease, or unspecified chronic kidney disease: Secondary | ICD-10-CM | POA: Insufficient documentation

## 2014-12-19 LAB — POCT URINALYSIS DIPSTICK
Bilirubin, UA: NEGATIVE
Blood, UA: NEGATIVE
Glucose, UA: NEGATIVE
Ketones, UA: NEGATIVE
LEUKOCYTES UA: NEGATIVE
Nitrite, UA: NEGATIVE
PROTEIN UA: 100
Spec Grav, UA: 1.02
UROBILINOGEN UA: 0.2
pH, UA: 5

## 2014-12-19 MED ORDER — AMLODIPINE BESYLATE 10 MG PO TABS: 10.0000 mg | ORAL_TABLET | Freq: Every day | ORAL | Status: DC

## 2014-12-19 MED ORDER — HYDROCORTISONE 0.5 % EX CREA: 1.0000 "application " | TOPICAL_CREAM | Freq: Two times a day (BID) | CUTANEOUS | Status: DC

## 2014-12-19 NOTE — Patient Instructions (Signed)
Systemic Lupus Erythematosus, Adult Systemic lupus erythematosus is a long-term (chronic) disease that can affect many parts of the body. It can damage the skin, joints, blood vessels, brain, kidneys, lungs, heart, and other internal organs. It causes pain, irritation, and inflammation. Systemic lupus erythematosus is an autoimmune disease. With this type of disease, the body's defense system (immune system) mistakenly attacks normal tissues instead of attacking germs or abnormal growths. CAUSES The cause of this condition is not known. RISK FACTORS This condition is more likely to develop in:  Females.  People of Asian descent.  People of African-American descent.  People who have a family history of the condition. SYMPTOMS General symptoms include:  Joint pain and swelling (common).  Fever.  Fatigue.  Unusual weight loss or weight gain.  Skin rashes, especially over the nose and cheeks (butterfly rash) and after sun exposure.  Sores inside the mouth or nose. Other symptoms depend on which parts of the body are affected. They can include:  Shortness of breath.  Chest pain.  Frequent urination.  Blood in the urine.  Seizures.  Mental changes.  Hair loss.  Swollen and tender lymph nodes.  Swelling of the hands or feet. Symptoms can come and go. A period of time when symptoms get worse or come back is called a flare. A period of time with no symptoms is called a remission. DIAGNOSIS This condition is diagnosed based on symptoms, a medical history, and a physical exam. You may also have tests, including:  Blood tests.  Urine tests.  A chest X-ray.  A skin or kidney biopsy. For this test, a sample of tissue is taken from the skin or kidney and studied under a microscope. You may be referred to an autoimmune disease specialist (rheumatologist). TREATMENT There is no cure for this condition, but treatment can keep the disease in remission, help to control  symptoms, and prevent damage to the heart, lungs, kidneys, and other organs. Treatment may involve taking a combination of medicines over time. HOME CARE INSTRUCTIONS Medicines  Take medicines only as directed by your health care provider.  Do not take any medicines that contain estrogen without first checking with your health care provider. Estrogen can trigger flares and may increase your risk for blood clots. Lifestyle  Eat a heart-healthy diet.  Stay active as directed by your health care provider.  Do not smoke. If you need help quitting, ask your health care provider.  Protect your skin from the sun by applying sunblock and wearing protective hats and clothing.  Learn as much as you can about your condition and have a good support system in place. Support may come from family, friends, or a lupus support group. General Instructions  Keep all follow-up visits as directed by your health care provider. This is important.  Work closely with all of your health care providers to manage your condition.  Let your health care provider know right away if you become pregnant or if you plan to become pregnant. Pregnancy in women with this condition is considered high risk. SEEK MEDICAL CARE IF:  You have a fever.  Your symptoms flare.  You develop new symptoms.  You develop swollen feet or hands.  You develop puffiness around your eyes.  Your medicines are not working.  You have bloody, foamy, or coffee-colored urine.  There are changes in your urination. For example, you urinate more often at night.  You think that you may be depressed or have anxiety. [place]  IF:  You have chest pain.  You have trouble breathing.  You have a seizure.  You suddenly get a very bad headache.  You suddenly develop facial or body weakness.  You cannot speak.  You cannot understand speech.   This information is not intended to replace advice given to you by your  health care provider. Make sure you discuss any questions you have with your health care provider.   Document Released: 01/22/2002 Document Revised: 06/18/2014 Document Reviewed: 01/09/2014 Elsevier Interactive Patient Education 2016 [place] DASH stands for "Dietary Approaches to Stop Hypertension." The DASH eating plan is a healthy eating plan that has been shown to reduce high blood pressure (hypertension). Additional health benefits may include reducing the risk of type 2 diabetes mellitus, heart disease, and stroke. The DASH eating plan may also help with weight loss. WHAT DO I NEED TO KNOW ABOUT THE DASH EATING PLAN? For the DASH eating plan, you will follow these general guidelines:  Choose foods with a percent daily value for sodium of less than 5% (as listed on the food label).  Use salt-free seasonings or herbs instead of table salt or sea salt.  Check with your health care provider or pharmacist before using salt substitutes.  Eat lower-sodium products, often labeled as "lower sodium" or "no salt added."  Eat fresh foods.  Eat more vegetables, fruits, and low-fat dairy products.  Choose whole grains. Look for the word "whole" as the first word in the ingredient list.  Choose fish and skinless chicken or [place] more often than red meat. Limit fish, poultry, and meat to 6 oz (170 g) each day.  Limit sweets, desserts, sugars, and sugary drinks.  Choose heart-healthy fats.  Limit cheese to 1 oz (28 g) per day.  Eat more home-cooked food and less restaurant, buffet, and fast food.  Limit fried foods.  Cook foods using methods other than frying.  Limit canned vegetables. If you do use them, rinse them well to decrease the sodium.  When eating at a restaurant, ask that your food be prepared with less salt, or no salt if possible. WHAT FOODS CAN I EAT? Seek help from a dietitian for individual calorie needs. Grains Whole grain or whole wheat bread.  Brown rice. Whole grain or whole wheat pasta. Quinoa, bulgur, and whole grain cereals. Low-sodium cereals. Corn or whole wheat flour tortillas. Whole grain cornbread. Whole grain crackers. Low-sodium crackers. Vegetables Fresh or frozen vegetables (raw, steamed, roasted, or grilled). Low-sodium or reduced-sodium tomato and vegetable juices. Low-sodium or reduced-sodium tomato sauce and paste. Low-sodium or reduced-sodium canned vegetables.  Fruits All fresh, canned (in natural juice), or frozen fruits. Meat and Other Protein Products Ground beef (85% or leaner), grass-fed beef, or beef trimmed of fat. Skinless chicken or [place]. Ground chicken or [place]. Pork trimmed of fat. All fish and seafood. Eggs. Dried beans, peas, or lentils. Unsalted nuts and seeds. Unsalted canned beans. Dairy Low-fat dairy products, such as skim or 1% milk, 2% or reduced-fat cheeses, low-fat ricotta or cottage cheese, or plain low-fat yogurt. Low-sodium or reduced-sodium cheeses. Fats and Oils Tub margarines without trans fats. Light or reduced-fat mayonnaise and salad dressings (reduced sodium). Avocado. Safflower, olive, or canola oils. Natural peanut or almond butter. Other Unsalted popcorn and pretzels. The items listed above may not be a complete list of recommended foods or beverages. Contact your dietitian for more options. WHAT FOODS ARE NOT RECOMMENDED? Grains White bread. White pasta. White rice. Refined cornbread. Bagels  and croissants. Crackers that contain trans fat. Vegetables Creamed or fried vegetables. Vegetables in a cheese sauce. Regular canned vegetables. Regular canned tomato sauce and paste. Regular tomato and vegetable juices. Fruits Dried fruits. Canned fruit in light or heavy syrup. Fruit juice. Meat and Other Protein Products Fatty cuts of meat. Ribs, chicken wings, bacon, sausage, bologna, salami, chitterlings, fatback, hot dogs, bratwurst, and packaged luncheon meats. Salted nuts and seeds.  Canned beans with salt. Dairy Whole or 2% milk, cream, half-and-half, and cream cheese. Whole-fat or sweetened yogurt. Full-fat cheeses or blue cheese. Nondairy creamers and whipped toppings. Processed cheese, cheese spreads, or cheese curds. Condiments Onion and garlic salt, seasoned salt, table salt, and sea salt. Canned and packaged gravies. Worcestershire sauce. Tartar sauce. Barbecue sauce. Teriyaki sauce. Soy sauce, including reduced sodium. Steak sauce. Fish sauce. Oyster sauce. Cocktail sauce. Horseradish. Ketchup and mustard. Meat flavorings and tenderizers. Bouillon cubes. Hot sauce. Tabasco sauce. Marinades. Taco seasonings. Relishes. Fats and Oils Butter, stick margarine, lard, shortening, ghee, and bacon fat. Coconut, palm kernel, or palm oils. Regular salad dressings. Other Pickles and olives. Salted popcorn and pretzels. The items listed above may not be a complete list of foods and beverages to avoid. Contact your dietitian for more information. WHERE CAN I FIND MORE INFORMATION? National Heart, Lung, and Blood Institute: travelstabloid.com   This information is not intended to replace advice given to you by your health care provider. Make sure you discuss any questions you have with your health care provider.   Document Released: 01/21/2011 Document Revised: 02/22/2014 Document Reviewed: 12/06/2012 Elsevier Interactive Patient Education 2016 [employer]. Hypertension Hypertension, commonly called high blood pressure, is when the force of blood pumping through your arteries is too strong. Your arteries are the blood vessels that carry blood from your heart throughout your body. A blood pressure reading consists of a higher number over a lower number, such as 110/72. The higher number (systolic) is the pressure inside your arteries when your heart pumps. The lower number (diastolic) is the pressure inside your arteries when your heart relaxes.  Ideally you want your blood pressure below 120/80. Hypertension forces your heart to work harder to pump blood. Your arteries may become narrow or stiff. Having untreated or uncontrolled hypertension can cause heart attack, stroke, kidney disease, and other problems. RISK FACTORS Some risk factors for high blood pressure are controllable. Others are not.  Risk factors you cannot control include:   Race. You may be at higher risk if you are African American.  Age. Risk increases with age.  Gender. Men are at higher risk than women before age 34 years. After age 41, women are at higher risk than men. Risk factors you can control include:  Not getting enough exercise or physical activity.  Being overweight.  Getting too much fat, sugar, calories, or salt in your diet.  Drinking too much alcohol. SIGNS AND SYMPTOMS Hypertension does not usually cause signs or symptoms. Extremely high blood pressure (hypertensive crisis) may cause headache, anxiety, shortness of breath, and nosebleed. DIAGNOSIS To check if you have hypertension, your health care provider will measure your blood pressure while you are seated, with your arm held at the level of your heart. It should be measured at least twice using the same arm. Certain conditions can cause a difference in blood pressure between your right and left arms. A blood pressure reading that is higher than normal on one occasion does not mean that you need treatment. If it is not clear  whether you have high blood pressure, you may be asked to return on a different day to have your blood pressure checked again. Or, you may be asked to monitor your blood pressure at home for 1 or more weeks. TREATMENT Treating high blood pressure includes making lifestyle changes and possibly taking medicine. Living a healthy lifestyle can help lower high blood pressure. You may need to change some of your habits. Lifestyle changes may include:  Following the DASH diet.  This diet is high in fruits, vegetables, and whole grains. It is low in salt, red meat, and added sugars.  Keep your sodium intake below 2,300 mg per day.  Getting at least 30-45 minutes of aerobic exercise at least 4 times per week.  Losing weight if necessary.  Not smoking.  Limiting alcoholic beverages.  Learning ways to reduce stress. Your health care provider may prescribe medicine if lifestyle changes are not enough to get your blood pressure under control, and if one of the following is true:  You are 74-33 years of age and your systolic blood pressure is above 140.  You are 76 years of age or older, and your systolic blood pressure is above 150.  Your diastolic blood pressure is above 90.  You have diabetes, and your systolic blood pressure is over 998 or your diastolic blood pressure is over 90.  You have kidney disease and your blood pressure is above 140/90.  You have heart disease and your blood pressure is above 140/90. Your personal target blood pressure may vary depending on your medical conditions, your age, and other factors. HOME CARE INSTRUCTIONS  Have your blood pressure rechecked as directed by your health care provider.   Take medicines only as directed by your health care provider. Follow the directions carefully. Blood pressure medicines must be taken as prescribed. The medicine does not work as well when you skip doses. Skipping doses also puts you at risk for problems.  Do not smoke.   Monitor your blood pressure at home as directed by your health care provider. SEEK MEDICAL CARE IF:   You think you are having a reaction to medicines taken.  You have recurrent headaches or feel dizzy.  You have swelling in your ankles.  You have trouble with your vision. SEEK IMMEDIATE MEDICAL CARE IF:  You develop a severe headache or confusion.  You have unusual weakness, numbness, or feel faint.  You have severe chest or abdominal pain.  You vomit  repeatedly.  You have trouble breathing. MAKE SURE YOU:   Understand these instructions.  Will watch your condition.  Will get help right away if you are not doing well or get worse.   This information is not intended to replace advice given to you by your health care provider. Make sure you discuss any questions you have with your health care provider.   Document Released: 02/01/2005 Document Revised: 06/18/2014 Document Reviewed: 11/24/2012 Elsevier Interactive Patient Education [employer].

## 2014-12-19 NOTE — Progress Notes (Signed)
Patient ID: Jenna [name], female   DOB: 1955/11/20, 59 y.o.   MRN: [id number]   Jenna [name], is a 59 y.o. female  [id number]  [id number]  DOB - [birth year]  Chief Complaint  Patient presents with  . Follow-up        Subjective:   Jenna [name] is a 59 y.o. female here today for a follow up visit. Patient has history of systemic lupus erythematosus, morbid obesity, chronic kidney disease and hypertension. She is here today for medication management. Blood pressure has remained uncontrolled despite Lasix to 40 mg tablet by mouth daily, and losartan 100 mg tablet by mouth daily. Patient was supposed to be on Norvasc but she has not been taking it. She has been experiencing some lower abdominal pain for about 2 weeks but stopped yesterday, intermittent, nonradiating, rated at about 4 out of 10. No urinary symptoms. No nausea or vomiting, no constipation or diarrhea. Patient also complaining of rash in her back that may be because of her lupus, she would like it checked out. Patient has No headache, No chest pain, No new weakness tingling or numbness, No Cough - SOB.  No problems updated.  ALLERGIES: Allergies  Allergen Reactions  . Enalapril Maleate Other (See Comments)    Throat swelling  . Chocolate Nausea And Vomiting  . Penicillins Other (See Comments)    Light headed    PAST MEDICAL HISTORY: Past Medical History  Diagnosis Date  . Lupus (systemic lupus erythematosus) ([place])   . Obesity   . Renal stone     MEDICATIONS AT HOME: Prior to Admission medications   Medication Sig Start Date End Date Taking? Authorizing Provider  acetaminophen (TYLENOL) 500 MG tablet Take 1,000 mg by mouth every 6 (six) hours as needed for headache.   Yes Historical Provider, MD  calcium carbonate (TUMS - DOSED IN MG ELEMENTAL CALCIUM) 500 MG chewable tablet Chew 1 tablet by mouth daily as needed for indigestion or heartburn.   Yes Historical Provider, MD  cholecalciferol (VITAMIN D)  1000 UNITS tablet Take 2,000 Units by mouth daily.   Yes Historical Provider, MD  furosemide (LASIX) 40 MG tablet Take 40 mg by mouth daily.   Yes Historical Provider, MD  HYDROcodone-acetaminophen (NORCO) 7.5-325 MG per tablet Take 1 tablet by mouth every 6 (six) hours as needed for moderate pain or severe pain.  05/31/14  Yes Historical Provider, MD  hydroxychloroquine (PLAQUENIL) 200 MG tablet Take 1 tablet (200 mg total) by mouth 2 (two) times daily. 12/25/13  Yes Tresa Garter, MD  losartan (COZAAR) 100 MG tablet Take 1 tablet (100 mg total) by mouth daily. 08/15/14  Yes Tresa Garter, MD  amLODipine (NORVASC) 10 MG tablet Take 1 tablet (10 mg total) by mouth daily. 12/19/14   Tresa Garter, MD  aspirin 81 MG tablet Take 1 tablet (81 mg total) by mouth daily. Patient not taking: Reported on 12/19/2014 12/25/13   Tresa Garter, MD  Multiple Vitamins-Minerals (MULTIVITAMIN & MINERAL PO) Take 1 tablet by mouth daily.    Historical Provider, MD     Objective:   Filed Vitals:   12/19/14 1045  BP: 151/98  Pulse: 72  Temp: 98.5 F (36.9 C)  TempSrc: Oral  Resp: 18  Height: $Remove'5\' 7"'[id number]$  (1.702 m)  Weight: 217 lb 12.8 oz (98.793 kg)  SpO2: 100%    Exam General appearance : Awake, alert, not in any distress. Speech Clear. Not toxic looking, obese HEENT: Atraumatic and Normocephalic, pupils equally  reactive to light and accomodation Neck: supple, no JVD. No cervical lymphadenopathy.  Chest:Good air entry bilaterally, no added sounds  CVS: S1 S2 regular, no murmurs.  Abdomen: Bowel sounds present, Non tender and not distended with no gaurding, rigidity or rebound. Extremities: B/L Lower Ext shows no edema, both legs are warm to touch Neurology: Awake alert, and oriented X 3, CN II-XII intact, Non focal Skin: 2 spots of eczematous rashes on the back close to the left scapular area, hyperpigmented  Data Review Lab Results  Component Value Date   HGBA1C 5.30 10/31/2014    HGBA1C 5.4 05/23/2014   HGBA1C 5.9* 05/05/2013     Assessment & Plan   1. Essential hypertension: Not at goal Add - amLODipine (NORVASC) 10 MG tablet; Take 1 tablet (10 mg total) by mouth daily.  Dispense: 90 tablet; Refill: 3 - Continue losartan 100 mg tablet by mouth daily  We have discussed target BP range and blood pressure goal. I have advised patient to check BP regularly and to call us back or report to clinic if the numbers are consistently higher than 140/90. We discussed the importance of compliance with medical therapy and DASH diet recommended, consequences of uncontrolled hypertension discussed.   - continue current BP medications  2. Lupus (systemic lupus erythematosus) ([place])  Continue hydroxychloroquine Follow-up with rheumatologist as scheduled Hydrocortisone cream for rash Patient have been counseled extensively about nutrition and exercise  Return in about 2 weeks (around 01/02/2015) for [facility] [place] for BP and Meds management.  The patient was given clear instructions to go to ER or return to medical center if symptoms don't improve, worsen or new problems develop. The patient verbalized understanding. The patient was told to call to get lab results if they haven't heard anything in the next week.   This note has been created with Surveyor, quantity. Any transcriptional errors are unintentional.    Angelica Chessman, MD, [place], [place]arilyn [place], [place], [place]   12/19/2014, 11:42 AM

## 2014-12-19 NOTE — Progress Notes (Signed)
Patient is here for medication concerns. Patient would like to speak about BP and kidney medication.  Patient complains of stomach pain. Pain has been present for about a week.

## 2015-01-02 ENCOUNTER — Telehealth: Payer: Self-pay | Admitting: Internal Medicine

## 2015-01-02 NOTE — Telephone Encounter (Signed)
Patient has questions about her Amlodipine, she says she thinks that they are making her feel sick. Please follow up with pt. Thank you.

## 2015-01-07 ENCOUNTER — Encounter: Payer: Medicaid Other | Admitting: Pharmacist

## 2015-02-06 DIAGNOSIS — M3214 Glomerular disease in systemic lupus erythematosus: Secondary | ICD-10-CM | POA: Diagnosis not present

## 2015-02-06 DIAGNOSIS — N189 Chronic kidney disease, unspecified: Secondary | ICD-10-CM | POA: Diagnosis not present

## 2015-02-27 DIAGNOSIS — F99 Mental disorder, not otherwise specified: Secondary | ICD-10-CM | POA: Diagnosis not present

## 2015-02-27 DIAGNOSIS — M3214 Glomerular disease in systemic lupus erythematosus: Secondary | ICD-10-CM | POA: Diagnosis not present

## 2015-02-27 DIAGNOSIS — E669 Obesity, unspecified: Secondary | ICD-10-CM | POA: Diagnosis not present

## 2015-03-06 DIAGNOSIS — E559 Vitamin D deficiency, unspecified: Secondary | ICD-10-CM | POA: Diagnosis not present

## 2015-03-06 DIAGNOSIS — M3214 Glomerular disease in systemic lupus erythematosus: Secondary | ICD-10-CM | POA: Diagnosis not present

## 2015-03-06 DIAGNOSIS — D6862 Lupus anticoagulant syndrome: Secondary | ICD-10-CM | POA: Diagnosis not present

## 2015-03-06 DIAGNOSIS — E669 Obesity, unspecified: Secondary | ICD-10-CM | POA: Diagnosis not present

## 2015-03-06 DIAGNOSIS — R531 Weakness: Secondary | ICD-10-CM | POA: Diagnosis not present

## 2015-03-06 DIAGNOSIS — M329 Systemic lupus erythematosus, unspecified: Secondary | ICD-10-CM | POA: Diagnosis not present

## 2015-03-18 DIAGNOSIS — F332 Major depressive disorder, recurrent severe without psychotic features: Secondary | ICD-10-CM | POA: Diagnosis not present

## 2015-03-18 DIAGNOSIS — F99 Mental disorder, not otherwise specified: Secondary | ICD-10-CM | POA: Diagnosis not present

## 2015-03-20 DIAGNOSIS — M3214 Glomerular disease in systemic lupus erythematosus: Secondary | ICD-10-CM | POA: Diagnosis not present

## 2015-04-14 DIAGNOSIS — H5310 Unspecified subjective visual disturbances: Secondary | ICD-10-CM | POA: Diagnosis not present

## 2015-04-14 DIAGNOSIS — Z79899 Other long term (current) drug therapy: Secondary | ICD-10-CM | POA: Diagnosis not present

## 2015-04-23 DIAGNOSIS — M150 Primary generalized (osteo)arthritis: Secondary | ICD-10-CM | POA: Diagnosis not present

## 2015-04-23 DIAGNOSIS — M25562 Pain in left knee: Secondary | ICD-10-CM | POA: Diagnosis not present

## 2015-04-23 DIAGNOSIS — M329 Systemic lupus erythematosus, unspecified: Secondary | ICD-10-CM | POA: Diagnosis not present

## 2015-04-23 DIAGNOSIS — M170 Bilateral primary osteoarthritis of knee: Secondary | ICD-10-CM | POA: Diagnosis not present

## 2015-04-23 DIAGNOSIS — M25561 Pain in right knee: Secondary | ICD-10-CM | POA: Diagnosis not present

## 2015-04-23 DIAGNOSIS — M3214 Glomerular disease in systemic lupus erythematosus: Secondary | ICD-10-CM | POA: Diagnosis not present

## 2015-05-28 DIAGNOSIS — F0631 Mood disorder due to known physiological condition with depressive features: Secondary | ICD-10-CM | POA: Diagnosis not present

## 2015-06-17 DIAGNOSIS — F0631 Mood disorder due to known physiological condition with depressive features: Secondary | ICD-10-CM | POA: Diagnosis not present

## 2015-06-27 DIAGNOSIS — F4310 Post-traumatic stress disorder, unspecified: Secondary | ICD-10-CM | POA: Diagnosis not present

## 2015-06-27 DIAGNOSIS — F0631 Mood disorder due to known physiological condition with depressive features: Secondary | ICD-10-CM | POA: Diagnosis not present

## 2015-06-30 IMAGING — CR DG CHEST 2V
2 series · 2 of 2 positions shown · non-contrast
Comparison: 08/10/2013.

CLINICAL DATA: Initial encounter for 2 day history of dry cough and
right-sided neck pain.

EXAM:
CHEST  2 VIEW

[w chest pa]
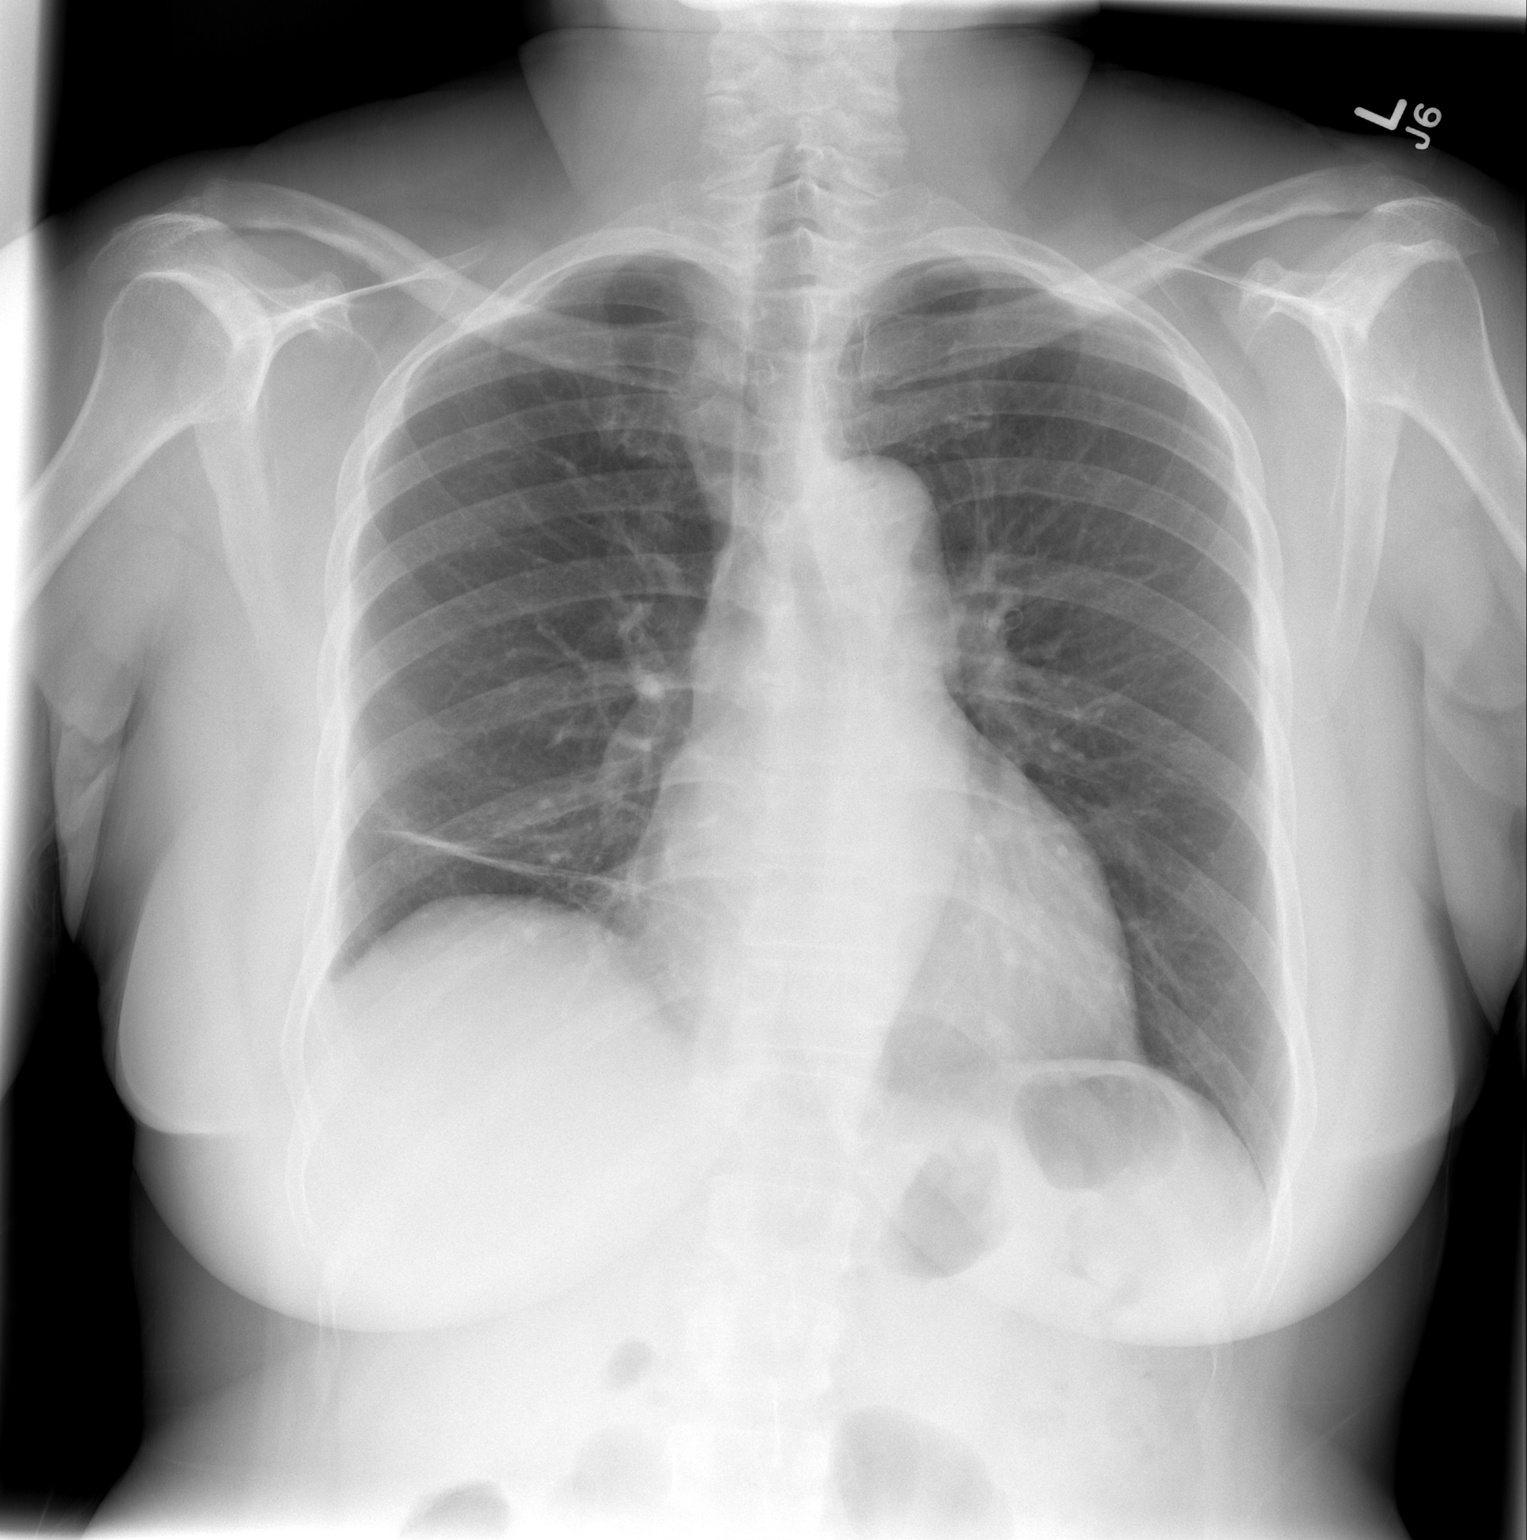

[w chest lat]
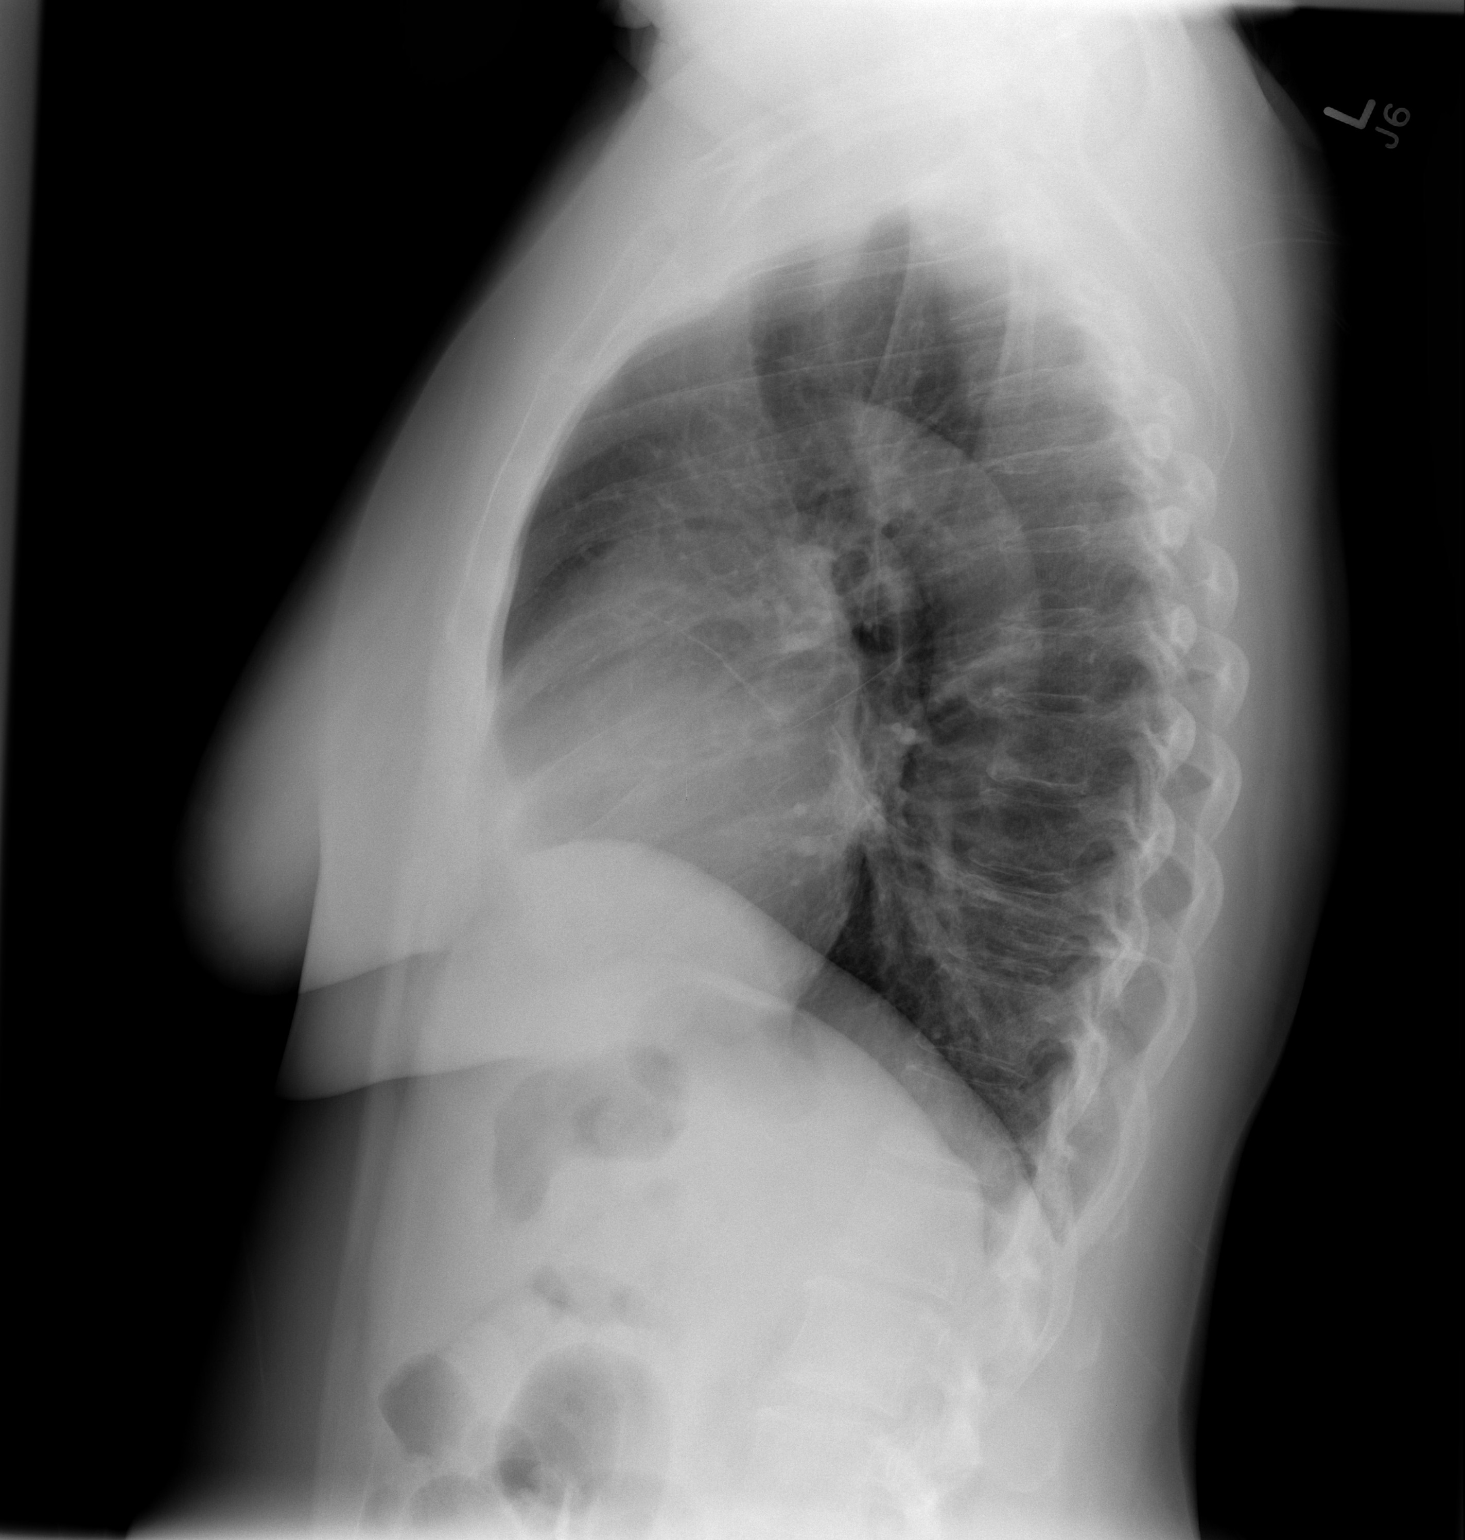

[2 of 2 positions shown; findings below may reference images not displayed]

FINDINGS: Stable asymmetric elevation of the right hemidiaphragm with chronic
subsegmental atelectasis or linear scarring at the right base. Lungs
are clear without edema, pneumothorax, focal consolidation, or
pleural effusion. The cardiopericardial silhouette is within normal
limits for size. Imaged bony structures of the thorax are intact.
IMPRESSION: Stable.  No acute cardiopulmonary findings.

## 2015-07-01 DIAGNOSIS — F4310 Post-traumatic stress disorder, unspecified: Secondary | ICD-10-CM | POA: Diagnosis not present

## 2015-07-01 DIAGNOSIS — F0631 Mood disorder due to known physiological condition with depressive features: Secondary | ICD-10-CM | POA: Diagnosis not present

## 2015-07-08 DIAGNOSIS — F0631 Mood disorder due to known physiological condition with depressive features: Secondary | ICD-10-CM | POA: Diagnosis not present

## 2015-07-08 DIAGNOSIS — F4310 Post-traumatic stress disorder, unspecified: Secondary | ICD-10-CM | POA: Diagnosis not present

## 2015-07-10 DIAGNOSIS — I10 Essential (primary) hypertension: Secondary | ICD-10-CM | POA: Diagnosis not present

## 2015-07-10 DIAGNOSIS — N183 Chronic kidney disease, stage 3 (moderate): Secondary | ICD-10-CM | POA: Diagnosis not present

## 2015-07-10 DIAGNOSIS — J329 Chronic sinusitis, unspecified: Secondary | ICD-10-CM | POA: Diagnosis not present

## 2015-07-10 DIAGNOSIS — M329 Systemic lupus erythematosus, unspecified: Secondary | ICD-10-CM | POA: Diagnosis not present

## 2015-07-17 DIAGNOSIS — M3214 Glomerular disease in systemic lupus erythematosus: Secondary | ICD-10-CM | POA: Diagnosis not present

## 2015-07-17 DIAGNOSIS — J329 Chronic sinusitis, unspecified: Secondary | ICD-10-CM | POA: Diagnosis not present

## 2015-07-17 DIAGNOSIS — J309 Allergic rhinitis, unspecified: Secondary | ICD-10-CM | POA: Diagnosis not present

## 2015-07-23 DIAGNOSIS — F4310 Post-traumatic stress disorder, unspecified: Secondary | ICD-10-CM | POA: Diagnosis not present

## 2015-07-23 DIAGNOSIS — F0631 Mood disorder due to known physiological condition with depressive features: Secondary | ICD-10-CM | POA: Diagnosis not present

## 2015-07-30 DIAGNOSIS — N189 Chronic kidney disease, unspecified: Secondary | ICD-10-CM | POA: Diagnosis not present

## 2015-07-30 DIAGNOSIS — M3214 Glomerular disease in systemic lupus erythematosus: Secondary | ICD-10-CM | POA: Diagnosis not present

## 2015-08-04 ENCOUNTER — Other Ambulatory Visit: Payer: Self-pay | Admitting: Internal Medicine

## 2015-08-06 DIAGNOSIS — F0631 Mood disorder due to known physiological condition with depressive features: Secondary | ICD-10-CM | POA: Diagnosis not present

## 2015-08-06 DIAGNOSIS — F4310 Post-traumatic stress disorder, unspecified: Secondary | ICD-10-CM | POA: Diagnosis not present

## 2015-08-13 DIAGNOSIS — F4310 Post-traumatic stress disorder, unspecified: Secondary | ICD-10-CM | POA: Diagnosis not present

## 2015-08-13 DIAGNOSIS — F0631 Mood disorder due to known physiological condition with depressive features: Secondary | ICD-10-CM | POA: Diagnosis not present

## 2015-08-27 DIAGNOSIS — F0631 Mood disorder due to known physiological condition with depressive features: Secondary | ICD-10-CM | POA: Diagnosis not present

## 2015-08-27 DIAGNOSIS — F4310 Post-traumatic stress disorder, unspecified: Secondary | ICD-10-CM | POA: Diagnosis not present

## 2015-09-12 ENCOUNTER — Other Ambulatory Visit: Payer: Self-pay | Admitting: Internal Medicine

## 2015-09-12 NOTE — Telephone Encounter (Signed)
Rx Requests 

## 2015-09-17 DIAGNOSIS — F0631 Mood disorder due to known physiological condition with depressive features: Secondary | ICD-10-CM | POA: Diagnosis not present

## 2015-09-17 DIAGNOSIS — F4310 Post-traumatic stress disorder, unspecified: Secondary | ICD-10-CM | POA: Diagnosis not present

## 2015-09-18 DIAGNOSIS — H16103 Unspecified superficial keratitis, bilateral: Secondary | ICD-10-CM | POA: Diagnosis not present

## 2015-09-18 DIAGNOSIS — H04123 Dry eye syndrome of bilateral lacrimal glands: Secondary | ICD-10-CM | POA: Diagnosis not present

## 2015-09-18 DIAGNOSIS — Z79899 Other long term (current) drug therapy: Secondary | ICD-10-CM | POA: Diagnosis not present

## 2015-10-14 ENCOUNTER — Other Ambulatory Visit: Payer: Self-pay | Admitting: Internal Medicine

## 2015-11-17 ENCOUNTER — Encounter (HOSPITAL_COMMUNITY): Payer: Self-pay | Admitting: Emergency Medicine

## 2015-11-17 ENCOUNTER — Ambulatory Visit (HOSPITAL_COMMUNITY)
Admission: EM | Admit: 2015-11-17 | Discharge: 2015-11-17 | Disposition: A | Discharge status: Home / Self Care | Payer: Medicaid Other | Attending: Internal Medicine | Admitting: Internal Medicine

## 2015-11-17 DIAGNOSIS — T148XXA Other injury of unspecified body region, initial encounter: Secondary | ICD-10-CM

## 2015-11-17 DIAGNOSIS — M79605 Pain in left leg: Secondary | ICD-10-CM

## 2015-11-17 NOTE — ED Triage Notes (Signed)
Patient reports a knot to anterior lower leg.  Patient fell 3 weeks ago.  Bruise last for a week.  Now a hard knot is palpable

## 2015-11-17 NOTE — Discharge Instructions (Signed)
This is a small blood clot in the fatty tissue under the skin. This is not the type of blood clot causes heart attack or lung problems. This will dissolve completely over the next few weeks. There is no special treatment for this at this time. Sometimes warm compresses will help the area to feel better and increase circulation.

## 2015-11-17 NOTE — ED Provider Notes (Signed)
CSN: [id number]     Arrival date & time 11/17/15  1736 History   First MD Initiated Contact with Patient 11/17/15 1858     Chief Complaint  Patient presents with  . Leg Pain   (Consider location/radiation/quality/duration/timing/severity/associated sxs/prior Treatment) 60 year old female states that she fell 3 weeks ago and injured the anterior aspect of her left lower leg. She  developed some swelling and ecchymosis over the area. she states it was discolored for a couple weeks. The discoloration has nearly abated and some swelling exist but she recently found a small knot in the area and is concerned whether this may be something serious. She is and auditory. Denies distal symptoms.       Past Medical History:  Diagnosis Date  . Lupus (systemic lupus erythematosus) ([place])   . Obesity   . Renal stone    History reviewed. No pertinent surgical history. Family History  Problem Relation Age of Onset  . Diabetes Mother   . Hypertension Father   . Cancer Sister   . Hypertension Brother    Social History  Substance Use Topics  . Smoking status: Never Smoker  . Smokeless tobacco: Never Used  . Alcohol use No   OB History    No data available     Review of Systems  Constitutional: Negative.   Respiratory: Negative.   Cardiovascular: Negative.   Skin: Positive for wound.  Neurological: Negative.   Hematological: Bruises/bleeds easily.  All other systems reviewed and are negative.   Allergies  Enalapril maleate; Chocolate; and Penicillins  Home Medications   Prior to Admission medications   Medication Sig Start Date End Date Taking? Authorizing Provider  acetaminophen (TYLENOL) 500 MG tablet Take 1,000 mg by mouth every 6 (six) hours as needed for headache.    Historical Provider, MD  amLODipine (NORVASC) 10 MG tablet Take 1 tablet (10 mg total) by mouth daily. 12/19/14   Tresa Garter, MD  aspirin 81 MG tablet Take 1 tablet (81 mg total) by mouth daily. Patient  not taking: Reported on 11/17/2015 12/25/13   Tresa Garter, MD  calcium carbonate (TUMS - DOSED IN MG ELEMENTAL CALCIUM) 500 MG chewable tablet Chew 1 tablet by mouth daily as needed for indigestion or heartburn.    Historical Provider, MD  cholecalciferol (VITAMIN D) 1000 UNITS tablet Take 2,000 Units by mouth daily.    Historical Provider, MD  furosemide (LASIX) 40 MG tablet Take 40 mg by mouth daily.    Historical Provider, MD  HYDROcodone-acetaminophen (NORCO) 7.5-325 MG per tablet Take 1 tablet by mouth every 6 (six) hours as needed for moderate pain or severe pain.  05/31/14   Historical Provider, MD  hydrocortisone cream 0.5 % Apply 1 application topically 2 (two) times daily. 12/19/14   Tresa Garter, MD  hydroxychloroquine (PLAQUENIL) 200 MG tablet Take 1 tablet (200 mg total) by mouth 2 (two) times daily. 12/25/13   Tresa Garter, MD  losartan (COZAAR) 100 MG tablet TAKE 1 TABLET (100 MG TOTAL) BY MOUTH DAILY. 10/14/15   Tresa Garter, MD  Multiple Vitamins-Minerals (MULTIVITAMIN & MINERAL PO) Take 1 tablet by mouth daily.    Historical Provider, MD   Meds Ordered and Administered this Visit  Medications - No data to display  BP 146/80 (BP Location: Left Arm)   Pulse 75   Temp 98.6 F (37 C) (Oral)   Resp 14   SpO2 100%  No data found.   Physical Exam  Constitutional: She  is oriented to person, place, and time. She appears well-developed and well-nourished. No distress.  Eyes: EOM are normal.  Neck: Neck supple.  Cardiovascular: Normal rate and regular rhythm.   Pulmonary/Chest: Effort normal. No respiratory distress.  Musculoskeletal: Normal range of motion. She exhibits no edema.  There is minor swelling to the anterior proximal shin just below the left knee. There is a small superficial nodule measuring approximately 2 cm. Mildly tender. No surrounding discoloration or other swelling.  Neurological: She is alert and oriented to person, place, and time.  She exhibits normal muscle tone.  Skin: Skin is warm and dry. Capillary refill takes less than 2 seconds.  Psychiatric: She has a normal mood and affect.  Nursing note and vitals reviewed.   Urgent Care Course   Clinical Course    Procedures (including critical care time)  Labs Review Labs Reviewed - No data to display  Imaging Review No results found.   Visual Acuity Review  Right Eye Distance:   Left Eye Distance:   Bilateral Distance:    Right Eye Near:   Left Eye Near:    Bilateral Near:         MDM   1. Left leg pain   2. Hematoma    This is a small blood clot in the fatty tissue under the skin. This is not the type of blood clot causes heart attack or lung problems. This will dissolve completely over the next few weeks. There is no special treatment for this at this time. Sometimes warm compresses will help the area to feel better and increase circulation.     Janne Napoleon, NP 11/17/15 1910

## 2015-11-20 DIAGNOSIS — L989 Disorder of the skin and subcutaneous tissue, unspecified: Secondary | ICD-10-CM | POA: Diagnosis not present

## 2015-11-20 DIAGNOSIS — M3214 Glomerular disease in systemic lupus erythematosus: Secondary | ICD-10-CM | POA: Diagnosis not present

## 2015-11-20 DIAGNOSIS — M150 Primary generalized (osteo)arthritis: Secondary | ICD-10-CM | POA: Diagnosis not present

## 2015-11-20 DIAGNOSIS — M170 Bilateral primary osteoarthritis of knee: Secondary | ICD-10-CM | POA: Diagnosis not present

## 2015-11-20 DIAGNOSIS — M329 Systemic lupus erythematosus, unspecified: Secondary | ICD-10-CM | POA: Diagnosis not present

## 2015-11-20 DIAGNOSIS — M546 Pain in thoracic spine: Secondary | ICD-10-CM | POA: Diagnosis not present

## 2015-11-21 ENCOUNTER — Other Ambulatory Visit: Payer: Self-pay | Admitting: Internal Medicine

## 2015-11-27 DIAGNOSIS — L821 Other seborrheic keratosis: Secondary | ICD-10-CM | POA: Diagnosis not present

## 2015-11-27 DIAGNOSIS — L853 Xerosis cutis: Secondary | ICD-10-CM | POA: Diagnosis not present

## 2015-11-27 DIAGNOSIS — L720 Epidermal cyst: Secondary | ICD-10-CM | POA: Diagnosis not present

## 2015-12-01 DIAGNOSIS — Z23 Encounter for immunization: Secondary | ICD-10-CM | POA: Diagnosis not present

## 2015-12-01 DIAGNOSIS — Z6835 Body mass index (BMI) 35.0-35.9, adult: Secondary | ICD-10-CM | POA: Diagnosis not present

## 2015-12-01 DIAGNOSIS — M3214 Glomerular disease in systemic lupus erythematosus: Secondary | ICD-10-CM | POA: Diagnosis not present

## 2015-12-01 DIAGNOSIS — N189 Chronic kidney disease, unspecified: Secondary | ICD-10-CM | POA: Diagnosis not present

## 2015-12-19 DIAGNOSIS — N184 Chronic kidney disease, stage 4 (severe): Secondary | ICD-10-CM | POA: Diagnosis not present

## 2015-12-24 DIAGNOSIS — I129 Hypertensive chronic kidney disease with stage 1 through stage 4 chronic kidney disease, or unspecified chronic kidney disease: Secondary | ICD-10-CM | POA: Diagnosis not present

## 2015-12-24 DIAGNOSIS — R609 Edema, unspecified: Secondary | ICD-10-CM | POA: Diagnosis not present

## 2015-12-24 DIAGNOSIS — E669 Obesity, unspecified: Secondary | ICD-10-CM | POA: Diagnosis not present

## 2015-12-24 DIAGNOSIS — M3214 Glomerular disease in systemic lupus erythematosus: Secondary | ICD-10-CM | POA: Diagnosis not present

## 2015-12-24 DIAGNOSIS — N184 Chronic kidney disease, stage 4 (severe): Secondary | ICD-10-CM | POA: Diagnosis not present

## 2016-01-05 DIAGNOSIS — E669 Obesity, unspecified: Secondary | ICD-10-CM | POA: Diagnosis not present

## 2016-01-05 DIAGNOSIS — N183 Chronic kidney disease, stage 3 (moderate): Secondary | ICD-10-CM | POA: Diagnosis not present

## 2016-01-05 DIAGNOSIS — I10 Essential (primary) hypertension: Secondary | ICD-10-CM | POA: Diagnosis not present

## 2016-01-05 DIAGNOSIS — M329 Systemic lupus erythematosus, unspecified: Secondary | ICD-10-CM | POA: Diagnosis not present

## 2016-01-05 DIAGNOSIS — G479 Sleep disorder, unspecified: Secondary | ICD-10-CM | POA: Diagnosis not present

## 2016-01-17 ENCOUNTER — Other Ambulatory Visit: Payer: Self-pay | Admitting: Internal Medicine

## 2016-01-21 DIAGNOSIS — D509 Iron deficiency anemia, unspecified: Secondary | ICD-10-CM | POA: Diagnosis not present

## 2016-01-21 DIAGNOSIS — R42 Dizziness and giddiness: Secondary | ICD-10-CM | POA: Diagnosis not present

## 2016-01-21 DIAGNOSIS — E669 Obesity, unspecified: Secondary | ICD-10-CM | POA: Diagnosis not present

## 2016-01-21 DIAGNOSIS — M329 Systemic lupus erythematosus, unspecified: Secondary | ICD-10-CM | POA: Diagnosis not present

## 2016-02-20 DIAGNOSIS — D509 Iron deficiency anemia, unspecified: Secondary | ICD-10-CM | POA: Diagnosis not present

## 2016-02-20 DIAGNOSIS — M79672 Pain in left foot: Secondary | ICD-10-CM | POA: Diagnosis not present

## 2016-02-20 DIAGNOSIS — N183 Chronic kidney disease, stage 3 (moderate): Secondary | ICD-10-CM | POA: Diagnosis not present

## 2016-02-20 DIAGNOSIS — I10 Essential (primary) hypertension: Secondary | ICD-10-CM | POA: Diagnosis not present

## 2016-02-20 DIAGNOSIS — M329 Systemic lupus erythematosus, unspecified: Secondary | ICD-10-CM | POA: Diagnosis not present

## 2016-02-23 DIAGNOSIS — M25572 Pain in left ankle and joints of left foot: Secondary | ICD-10-CM | POA: Diagnosis not present

## 2016-03-09 DIAGNOSIS — F331 Major depressive disorder, recurrent, moderate: Secondary | ICD-10-CM | POA: Diagnosis not present

## 2016-03-16 DIAGNOSIS — F4310 Post-traumatic stress disorder, unspecified: Secondary | ICD-10-CM | POA: Diagnosis not present

## 2016-03-19 DIAGNOSIS — N183 Chronic kidney disease, stage 3 (moderate): Secondary | ICD-10-CM | POA: Diagnosis not present

## 2016-03-19 DIAGNOSIS — E119 Type 2 diabetes mellitus without complications: Secondary | ICD-10-CM | POA: Diagnosis not present

## 2016-03-19 DIAGNOSIS — E669 Obesity, unspecified: Secondary | ICD-10-CM | POA: Diagnosis not present

## 2016-03-19 DIAGNOSIS — M329 Systemic lupus erythematosus, unspecified: Secondary | ICD-10-CM | POA: Diagnosis not present

## 2016-04-08 DIAGNOSIS — F331 Major depressive disorder, recurrent, moderate: Secondary | ICD-10-CM | POA: Diagnosis not present

## 2016-04-08 DIAGNOSIS — F4310 Post-traumatic stress disorder, unspecified: Secondary | ICD-10-CM | POA: Diagnosis not present

## 2016-04-16 DIAGNOSIS — E669 Obesity, unspecified: Secondary | ICD-10-CM | POA: Diagnosis not present

## 2016-04-21 DIAGNOSIS — F331 Major depressive disorder, recurrent, moderate: Secondary | ICD-10-CM | POA: Diagnosis not present

## 2016-04-21 DIAGNOSIS — F4310 Post-traumatic stress disorder, unspecified: Secondary | ICD-10-CM | POA: Diagnosis not present

## 2016-04-22 DIAGNOSIS — N2581 Secondary hyperparathyroidism of renal origin: Secondary | ICD-10-CM | POA: Diagnosis not present

## 2016-04-22 DIAGNOSIS — M3214 Glomerular disease in systemic lupus erythematosus: Secondary | ICD-10-CM | POA: Diagnosis not present

## 2016-04-22 DIAGNOSIS — E669 Obesity, unspecified: Secondary | ICD-10-CM | POA: Diagnosis not present

## 2016-04-22 DIAGNOSIS — I129 Hypertensive chronic kidney disease with stage 1 through stage 4 chronic kidney disease, or unspecified chronic kidney disease: Secondary | ICD-10-CM | POA: Diagnosis not present

## 2016-04-22 DIAGNOSIS — N184 Chronic kidney disease, stage 4 (severe): Secondary | ICD-10-CM | POA: Diagnosis not present

## 2016-04-22 DIAGNOSIS — R609 Edema, unspecified: Secondary | ICD-10-CM | POA: Diagnosis not present

## 2016-05-10 DIAGNOSIS — M546 Pain in thoracic spine: Secondary | ICD-10-CM | POA: Diagnosis not present

## 2016-05-10 DIAGNOSIS — M150 Primary generalized (osteo)arthritis: Secondary | ICD-10-CM | POA: Diagnosis not present

## 2016-05-10 DIAGNOSIS — E669 Obesity, unspecified: Secondary | ICD-10-CM | POA: Diagnosis not present

## 2016-05-10 DIAGNOSIS — L989 Disorder of the skin and subcutaneous tissue, unspecified: Secondary | ICD-10-CM | POA: Diagnosis not present

## 2016-05-10 DIAGNOSIS — M170 Bilateral primary osteoarthritis of knee: Secondary | ICD-10-CM | POA: Diagnosis not present

## 2016-05-10 DIAGNOSIS — M3214 Glomerular disease in systemic lupus erythematosus: Secondary | ICD-10-CM | POA: Diagnosis not present

## 2016-05-10 DIAGNOSIS — Z6835 Body mass index (BMI) 35.0-35.9, adult: Secondary | ICD-10-CM | POA: Diagnosis not present

## 2016-05-10 DIAGNOSIS — M329 Systemic lupus erythematosus, unspecified: Secondary | ICD-10-CM | POA: Diagnosis not present

## 2016-05-12 DIAGNOSIS — N184 Chronic kidney disease, stage 4 (severe): Secondary | ICD-10-CM | POA: Diagnosis not present

## 2016-05-20 DIAGNOSIS — F331 Major depressive disorder, recurrent, moderate: Secondary | ICD-10-CM | POA: Diagnosis not present

## 2016-05-20 DIAGNOSIS — F4310 Post-traumatic stress disorder, unspecified: Secondary | ICD-10-CM | POA: Diagnosis not present

## 2016-05-21 DIAGNOSIS — E669 Obesity, unspecified: Secondary | ICD-10-CM | POA: Diagnosis not present

## 2016-05-27 DIAGNOSIS — F331 Major depressive disorder, recurrent, moderate: Secondary | ICD-10-CM | POA: Diagnosis not present

## 2016-05-27 DIAGNOSIS — F4310 Post-traumatic stress disorder, unspecified: Secondary | ICD-10-CM | POA: Diagnosis not present

## 2016-06-03 DIAGNOSIS — F4310 Post-traumatic stress disorder, unspecified: Secondary | ICD-10-CM | POA: Diagnosis not present

## 2016-06-03 DIAGNOSIS — F331 Major depressive disorder, recurrent, moderate: Secondary | ICD-10-CM | POA: Diagnosis not present

## 2016-06-07 DIAGNOSIS — F331 Major depressive disorder, recurrent, moderate: Secondary | ICD-10-CM | POA: Diagnosis not present

## 2016-06-07 DIAGNOSIS — F4310 Post-traumatic stress disorder, unspecified: Secondary | ICD-10-CM | POA: Diagnosis not present

## 2016-06-10 DIAGNOSIS — F4310 Post-traumatic stress disorder, unspecified: Secondary | ICD-10-CM | POA: Diagnosis not present

## 2016-06-10 DIAGNOSIS — F331 Major depressive disorder, recurrent, moderate: Secondary | ICD-10-CM | POA: Diagnosis not present

## 2016-06-21 DIAGNOSIS — E669 Obesity, unspecified: Secondary | ICD-10-CM | POA: Diagnosis not present

## 2016-06-21 DIAGNOSIS — D509 Iron deficiency anemia, unspecified: Secondary | ICD-10-CM | POA: Diagnosis not present

## 2016-06-21 DIAGNOSIS — N183 Chronic kidney disease, stage 3 (moderate): Secondary | ICD-10-CM | POA: Diagnosis not present

## 2016-06-21 DIAGNOSIS — M329 Systemic lupus erythematosus, unspecified: Secondary | ICD-10-CM | POA: Diagnosis not present

## 2016-06-21 DIAGNOSIS — R0609 Other forms of dyspnea: Secondary | ICD-10-CM | POA: Diagnosis not present

## 2016-06-23 DIAGNOSIS — F331 Major depressive disorder, recurrent, moderate: Secondary | ICD-10-CM | POA: Diagnosis not present

## 2016-06-23 DIAGNOSIS — F4310 Post-traumatic stress disorder, unspecified: Secondary | ICD-10-CM | POA: Diagnosis not present

## 2016-07-09 DIAGNOSIS — E669 Obesity, unspecified: Secondary | ICD-10-CM | POA: Diagnosis not present

## 2016-07-15 DIAGNOSIS — F331 Major depressive disorder, recurrent, moderate: Secondary | ICD-10-CM | POA: Diagnosis not present

## 2016-07-22 DIAGNOSIS — F331 Major depressive disorder, recurrent, moderate: Secondary | ICD-10-CM | POA: Diagnosis not present

## 2016-08-05 DIAGNOSIS — F331 Major depressive disorder, recurrent, moderate: Secondary | ICD-10-CM | POA: Diagnosis not present

## 2016-08-05 DIAGNOSIS — F4310 Post-traumatic stress disorder, unspecified: Secondary | ICD-10-CM | POA: Diagnosis not present

## 2016-08-09 DIAGNOSIS — M170 Bilateral primary osteoarthritis of knee: Secondary | ICD-10-CM | POA: Diagnosis not present

## 2016-08-09 DIAGNOSIS — M150 Primary generalized (osteo)arthritis: Secondary | ICD-10-CM | POA: Diagnosis not present

## 2016-08-09 DIAGNOSIS — M546 Pain in thoracic spine: Secondary | ICD-10-CM | POA: Diagnosis not present

## 2016-08-09 DIAGNOSIS — M3214 Glomerular disease in systemic lupus erythematosus: Secondary | ICD-10-CM | POA: Diagnosis not present

## 2016-08-09 DIAGNOSIS — M329 Systemic lupus erythematosus, unspecified: Secondary | ICD-10-CM | POA: Diagnosis not present

## 2016-08-09 DIAGNOSIS — E669 Obesity, unspecified: Secondary | ICD-10-CM | POA: Diagnosis not present

## 2016-08-09 DIAGNOSIS — Z6835 Body mass index (BMI) 35.0-35.9, adult: Secondary | ICD-10-CM | POA: Diagnosis not present

## 2016-08-17 DIAGNOSIS — E669 Obesity, unspecified: Secondary | ICD-10-CM | POA: Diagnosis not present

## 2016-08-17 DIAGNOSIS — M3214 Glomerular disease in systemic lupus erythematosus: Secondary | ICD-10-CM | POA: Diagnosis not present

## 2016-08-17 DIAGNOSIS — R609 Edema, unspecified: Secondary | ICD-10-CM | POA: Diagnosis not present

## 2016-08-17 DIAGNOSIS — N2581 Secondary hyperparathyroidism of renal origin: Secondary | ICD-10-CM | POA: Diagnosis not present

## 2016-08-17 DIAGNOSIS — I129 Hypertensive chronic kidney disease with stage 1 through stage 4 chronic kidney disease, or unspecified chronic kidney disease: Secondary | ICD-10-CM | POA: Diagnosis not present

## 2016-08-17 DIAGNOSIS — N184 Chronic kidney disease, stage 4 (severe): Secondary | ICD-10-CM | POA: Diagnosis not present

## 2016-08-30 DIAGNOSIS — G479 Sleep disorder, unspecified: Secondary | ICD-10-CM | POA: Diagnosis not present

## 2016-08-30 DIAGNOSIS — M329 Systemic lupus erythematosus, unspecified: Secondary | ICD-10-CM | POA: Diagnosis not present

## 2016-08-30 DIAGNOSIS — R0609 Other forms of dyspnea: Secondary | ICD-10-CM | POA: Diagnosis not present

## 2016-08-30 DIAGNOSIS — E669 Obesity, unspecified: Secondary | ICD-10-CM | POA: Diagnosis not present

## 2016-08-30 DIAGNOSIS — N183 Chronic kidney disease, stage 3 (moderate): Secondary | ICD-10-CM | POA: Diagnosis not present

## 2016-08-31 DIAGNOSIS — F4310 Post-traumatic stress disorder, unspecified: Secondary | ICD-10-CM | POA: Diagnosis not present

## 2016-08-31 DIAGNOSIS — F331 Major depressive disorder, recurrent, moderate: Secondary | ICD-10-CM | POA: Diagnosis not present

## 2016-09-03 DIAGNOSIS — F331 Major depressive disorder, recurrent, moderate: Secondary | ICD-10-CM | POA: Diagnosis not present

## 2016-09-03 DIAGNOSIS — F4310 Post-traumatic stress disorder, unspecified: Secondary | ICD-10-CM | POA: Diagnosis not present

## 2016-09-23 DIAGNOSIS — F331 Major depressive disorder, recurrent, moderate: Secondary | ICD-10-CM | POA: Diagnosis not present

## 2016-09-23 DIAGNOSIS — F4310 Post-traumatic stress disorder, unspecified: Secondary | ICD-10-CM | POA: Diagnosis not present

## 2016-10-07 DIAGNOSIS — F4310 Post-traumatic stress disorder, unspecified: Secondary | ICD-10-CM | POA: Diagnosis not present

## 2016-10-07 DIAGNOSIS — F331 Major depressive disorder, recurrent, moderate: Secondary | ICD-10-CM | POA: Diagnosis not present

## 2016-11-01 DIAGNOSIS — H25813 Combined forms of age-related cataract, bilateral: Secondary | ICD-10-CM | POA: Diagnosis not present

## 2016-11-01 DIAGNOSIS — H04123 Dry eye syndrome of bilateral lacrimal glands: Secondary | ICD-10-CM | POA: Diagnosis not present

## 2016-11-01 DIAGNOSIS — H353131 Nonexudative age-related macular degeneration, bilateral, early dry stage: Secondary | ICD-10-CM | POA: Diagnosis not present

## 2016-11-01 DIAGNOSIS — Z79899 Other long term (current) drug therapy: Secondary | ICD-10-CM | POA: Diagnosis not present

## 2016-11-02 DIAGNOSIS — F331 Major depressive disorder, recurrent, moderate: Secondary | ICD-10-CM | POA: Diagnosis not present

## 2016-11-09 DIAGNOSIS — M329 Systemic lupus erythematosus, unspecified: Secondary | ICD-10-CM | POA: Diagnosis not present

## 2016-11-09 DIAGNOSIS — M546 Pain in thoracic spine: Secondary | ICD-10-CM | POA: Diagnosis not present

## 2016-11-09 DIAGNOSIS — E669 Obesity, unspecified: Secondary | ICD-10-CM | POA: Diagnosis not present

## 2016-11-09 DIAGNOSIS — M170 Bilateral primary osteoarthritis of knee: Secondary | ICD-10-CM | POA: Diagnosis not present

## 2016-11-09 DIAGNOSIS — M3214 Glomerular disease in systemic lupus erythematosus: Secondary | ICD-10-CM | POA: Diagnosis not present

## 2016-11-09 DIAGNOSIS — Z6835 Body mass index (BMI) 35.0-35.9, adult: Secondary | ICD-10-CM | POA: Diagnosis not present

## 2016-11-09 DIAGNOSIS — M150 Primary generalized (osteo)arthritis: Secondary | ICD-10-CM | POA: Diagnosis not present

## 2016-11-10 DIAGNOSIS — F4310 Post-traumatic stress disorder, unspecified: Secondary | ICD-10-CM | POA: Diagnosis not present

## 2016-11-10 DIAGNOSIS — F331 Major depressive disorder, recurrent, moderate: Secondary | ICD-10-CM | POA: Diagnosis not present

## 2016-11-22 DIAGNOSIS — Z23 Encounter for immunization: Secondary | ICD-10-CM | POA: Diagnosis not present

## 2016-11-22 DIAGNOSIS — I129 Hypertensive chronic kidney disease with stage 1 through stage 4 chronic kidney disease, or unspecified chronic kidney disease: Secondary | ICD-10-CM | POA: Diagnosis not present

## 2016-11-22 DIAGNOSIS — E669 Obesity, unspecified: Secondary | ICD-10-CM | POA: Diagnosis not present

## 2016-11-22 DIAGNOSIS — Z6835 Body mass index (BMI) 35.0-35.9, adult: Secondary | ICD-10-CM | POA: Diagnosis not present

## 2016-11-22 DIAGNOSIS — N184 Chronic kidney disease, stage 4 (severe): Secondary | ICD-10-CM | POA: Diagnosis not present

## 2016-11-22 DIAGNOSIS — R609 Edema, unspecified: Secondary | ICD-10-CM | POA: Diagnosis not present

## 2016-11-22 DIAGNOSIS — N2581 Secondary hyperparathyroidism of renal origin: Secondary | ICD-10-CM | POA: Diagnosis not present

## 2016-11-22 DIAGNOSIS — M3214 Glomerular disease in systemic lupus erythematosus: Secondary | ICD-10-CM | POA: Diagnosis not present

## 2016-11-22 DIAGNOSIS — D649 Anemia, unspecified: Secondary | ICD-10-CM | POA: Diagnosis not present

## 2016-11-24 DIAGNOSIS — N183 Chronic kidney disease, stage 3 (moderate): Secondary | ICD-10-CM | POA: Diagnosis not present

## 2016-11-24 DIAGNOSIS — E669 Obesity, unspecified: Secondary | ICD-10-CM | POA: Diagnosis not present

## 2016-11-24 DIAGNOSIS — G479 Sleep disorder, unspecified: Secondary | ICD-10-CM | POA: Diagnosis not present

## 2016-11-24 DIAGNOSIS — M329 Systemic lupus erythematosus, unspecified: Secondary | ICD-10-CM | POA: Diagnosis not present

## 2016-12-04 ENCOUNTER — Ambulatory Visit (INDEPENDENT_AMBULATORY_CARE_PROVIDER_SITE_OTHER): Payer: Medicare Other

## 2016-12-04 ENCOUNTER — Encounter (HOSPITAL_COMMUNITY): Payer: Self-pay | Admitting: Emergency Medicine

## 2016-12-04 ENCOUNTER — Ambulatory Visit (HOSPITAL_COMMUNITY)
Admission: EM | Admit: 2016-12-04 | Discharge: 2016-12-04 | Disposition: A | Discharge status: Home / Self Care | Payer: Medicare Other | Attending: Family Medicine | Admitting: Family Medicine

## 2016-12-04 DIAGNOSIS — R079 Chest pain, unspecified: Secondary | ICD-10-CM

## 2016-12-04 DIAGNOSIS — R0789 Other chest pain: Secondary | ICD-10-CM | POA: Diagnosis not present

## 2016-12-04 DIAGNOSIS — R0602 Shortness of breath: Secondary | ICD-10-CM

## 2016-12-04 NOTE — ED Triage Notes (Signed)
Pt here for intermittent CP onset 2 days.... Has not noticed if anything makes it worse  Sx also include dyspnea  Though she may have pulled a muscle at first but pain has not resolved.   A&O x4... NAD... Ambulatory

## 2016-12-04 NOTE — Discharge Instructions (Signed)
Please return here or to the Emergency Department immediately should you feel worse in any way or have any of the following symptoms: increasing or different chest pain, pain that spreads to your arm, neck, jaw, back or abdomen, shortness of breath, or nausea and vomiting. ° °

## 2016-12-06 NOTE — ED Provider Notes (Signed)
  [facility]   [id number] 12/04/16 Arrival Time: 5003  ASSESSMENT & PLAN:  1. Nonspecific chest pain   2. Chest wall pain    ECG without changes when compared to old ECG. CXR without acute abnormality. Close observation. ED if needed. Reviewed expectations re: course of current medical issues. Questions answered. Outlined signs and symptoms indicating need for more acute intervention. Patient verbalized understanding. After Visit Summary given.   SUBJECTIVE:  Jenna [name] is a 61 y.o. female who presents with complaint of:  Chest Pain: Onset gradual, 2 days ago, with improving course since that time. Describes discomfort as intermittent, costochondral and pressure like in nature, does not radiate. Patient rates pain as a 2/10 in intensity. Associated symptoms: none. Aggravating factors: questions if certain movements exacerbates. Alleviating factors: none. No self treatment. No SOB/n/v.  ROS: As per HPI.   OBJECTIVE:  Vitals:   12/04/16 1714  Pulse: (!) 53  Resp: 20  Temp: 98.6 F (37 C)  TempSrc: Oral  SpO2: 99%    General appearance: alert; no distress Eyes: PERRLA; EOMI; conjunctiva normal HENT: normocephalic; atraumatic Neck: supple Lungs: clear to auscultation bilaterally Heart: regular rate and rhythm Chest Wall: mild tenderness over anterior left chest near sternum ("similar to what I've felt") Abdomen: soft, non-tender; bowel sounds normal; no masses or organomegaly; no guarding or rebound tenderness Extremities: no cyanosis or edema; symmetrical with no gross deformities Skin: warm and dry Psychological: alert and coope Imaging: Dg Chest 2 View  Result Date: 12/04/2016 CLINICAL DATA:  Chest pain and shortness of breath. EXAM: CHEST  2 VIEW COMPARISON:  06/03/2014 FINDINGS: The heart size and mediastinal contours are within normal limits. Stable scarring in right lower lung. No evidence of pulmonary infiltrate or edema. No evidence of pleural  effusion. IMPRESSION: Stable right lower lung scarring.  No active disease . Electronically Signed   By: Earle Gell M.D.   On: 12/04/2016 18:09    Allergies  Allergen Reactions  . Enalapril Maleate Other (See Comments)    Throat swelling  . Chocolate Nausea And Vomiting  . Penicillins Other (See Comments)    Light headed    Past Medical History:  Diagnosis Date  . Lupus (systemic lupus erythematosus) ([place])   . Obesity   . Renal stone    Social History   Social History  . Marital status: Legally Separated    Spouse name: N/A  . Number of children: N/A  . Years of education: N/A   Occupational History  . Not on file.   Social History Main Topics  . Smoking status: Never Smoker  . Smokeless tobacco: Never Used  . Alcohol use No  . Drug use: No  . Sexual activity: Not on file   Other Topics Concern  . Not on file   Social History Narrative  . No narrative on file   Family History  Problem Relation Age of Onset  . Diabetes Mother   . Hypertension Father   . Cancer Sister   . Hypertension Brother       Vanessa Kick, MD 12/06/16 1013

## 2016-12-08 DIAGNOSIS — F331 Major depressive disorder, recurrent, moderate: Secondary | ICD-10-CM | POA: Diagnosis not present

## 2016-12-15 DIAGNOSIS — F331 Major depressive disorder, recurrent, moderate: Secondary | ICD-10-CM | POA: Diagnosis not present

## 2016-12-15 DIAGNOSIS — F4310 Post-traumatic stress disorder, unspecified: Secondary | ICD-10-CM | POA: Diagnosis not present

## 2016-12-22 DIAGNOSIS — F4310 Post-traumatic stress disorder, unspecified: Secondary | ICD-10-CM | POA: Diagnosis not present

## 2016-12-30 DIAGNOSIS — Z79899 Other long term (current) drug therapy: Secondary | ICD-10-CM | POA: Diagnosis not present

## 2016-12-30 DIAGNOSIS — F4310 Post-traumatic stress disorder, unspecified: Secondary | ICD-10-CM | POA: Diagnosis not present

## 2017-01-12 DIAGNOSIS — F4310 Post-traumatic stress disorder, unspecified: Secondary | ICD-10-CM | POA: Diagnosis not present

## 2017-02-04 DIAGNOSIS — E669 Obesity, unspecified: Secondary | ICD-10-CM | POA: Diagnosis not present

## 2017-02-04 DIAGNOSIS — M546 Pain in thoracic spine: Secondary | ICD-10-CM | POA: Diagnosis not present

## 2017-02-04 DIAGNOSIS — F3289 Other specified depressive episodes: Secondary | ICD-10-CM | POA: Diagnosis not present

## 2017-02-04 DIAGNOSIS — Z6835 Body mass index (BMI) 35.0-35.9, adult: Secondary | ICD-10-CM | POA: Diagnosis not present

## 2017-02-04 DIAGNOSIS — M150 Primary generalized (osteo)arthritis: Secondary | ICD-10-CM | POA: Diagnosis not present

## 2017-02-04 DIAGNOSIS — M329 Systemic lupus erythematosus, unspecified: Secondary | ICD-10-CM | POA: Diagnosis not present

## 2017-02-04 DIAGNOSIS — M25561 Pain in right knee: Secondary | ICD-10-CM | POA: Diagnosis not present

## 2017-02-04 DIAGNOSIS — M3214 Glomerular disease in systemic lupus erythematosus: Secondary | ICD-10-CM | POA: Diagnosis not present

## 2017-02-04 DIAGNOSIS — M170 Bilateral primary osteoarthritis of knee: Secondary | ICD-10-CM | POA: Diagnosis not present

## 2017-02-09 ENCOUNTER — Emergency Department (HOSPITAL_COMMUNITY): Payer: Medicare Other

## 2017-02-09 ENCOUNTER — Other Ambulatory Visit: Payer: Self-pay

## 2017-02-09 ENCOUNTER — Emergency Department (HOSPITAL_COMMUNITY)
Admission: EM | Admit: 2017-02-09 | Discharge: 2017-02-09 | Disposition: A | Discharge status: Home / Self Care | Payer: Medicare Other | Attending: Emergency Medicine | Admitting: Emergency Medicine

## 2017-02-09 DIAGNOSIS — Z79899 Other long term (current) drug therapy: Secondary | ICD-10-CM | POA: Insufficient documentation

## 2017-02-09 DIAGNOSIS — Z7982 Long term (current) use of aspirin: Secondary | ICD-10-CM | POA: Insufficient documentation

## 2017-02-09 DIAGNOSIS — R0602 Shortness of breath: Secondary | ICD-10-CM | POA: Insufficient documentation

## 2017-02-09 DIAGNOSIS — I129 Hypertensive chronic kidney disease with stage 1 through stage 4 chronic kidney disease, or unspecified chronic kidney disease: Secondary | ICD-10-CM | POA: Insufficient documentation

## 2017-02-09 DIAGNOSIS — N184 Chronic kidney disease, stage 4 (severe): Secondary | ICD-10-CM | POA: Diagnosis not present

## 2017-02-09 DIAGNOSIS — R05 Cough: Secondary | ICD-10-CM | POA: Diagnosis not present

## 2017-02-09 DIAGNOSIS — R5383 Other fatigue: Secondary | ICD-10-CM | POA: Insufficient documentation

## 2017-02-09 DIAGNOSIS — B9789 Other viral agents as the cause of diseases classified elsewhere: Secondary | ICD-10-CM | POA: Diagnosis not present

## 2017-02-09 DIAGNOSIS — R0981 Nasal congestion: Secondary | ICD-10-CM | POA: Diagnosis not present

## 2017-02-09 DIAGNOSIS — Z8673 Personal history of transient ischemic attack (TIA), and cerebral infarction without residual deficits: Secondary | ICD-10-CM | POA: Diagnosis not present

## 2017-02-09 DIAGNOSIS — J069 Acute upper respiratory infection, unspecified: Secondary | ICD-10-CM | POA: Diagnosis not present

## 2017-02-09 DIAGNOSIS — B349 Viral infection, unspecified: Secondary | ICD-10-CM | POA: Diagnosis not present

## 2017-02-09 MED ORDER — BENZONATATE 100 MG PO CAPS: 100.0000 mg | ORAL_CAPSULE | Freq: Three times a day (TID) | ORAL | 0 refills | Status: DC

## 2017-02-09 MED ORDER — FLUTICASONE PROPIONATE 50 MCG/ACT NA SUSP: 1.0000 | Freq: Every day | NASAL | 2 refills | Status: DC

## 2017-02-09 MED ORDER — ALBUTEROL SULFATE (2.5 MG/3ML) 0.083% IN NEBU
5.0000 mg | INHALATION_SOLUTION | Freq: Once | RESPIRATORY_TRACT | Status: AC
  Administered 2017-02-09: 5 mg via RESPIRATORY_TRACT
  Filled 2017-02-09: qty 6

## 2017-02-09 NOTE — Discharge Instructions (Signed)
Please read attached information. If you experience any new or worsening signs or symptoms please return to the emergency room for evaluation. Please follow-up with your primary care provider or specialist as discussed. Please use medication prescribed only as directed and discontinue taking if you have any concerning signs or symptoms.  Pleas use saline nasal spray or netti pot as needed for nasal congestion as well.

## 2017-02-09 NOTE — ED Triage Notes (Signed)
Pt states she has been having productive cough with fever and chills.

## 2017-02-09 NOTE — ED Provider Notes (Signed)
[facility] EMERGENCY DEPARTMENT Provider Note   CSN: [id number] Arrival date & time: 02/09/17  3785     History   Chief Complaint Chief Complaint  Patient presents with  . Cough  . Chills    HPI Jenna [name] is a 61 y.o. female.  HPI   61 year old female presents today with complaints of upper respiratory infection.  Patient notes symptoms started approximately 1 week ago with fatigue, she notes rhinorrhea congestion and cough over the last 4 days.  She notes right-sided rib pain and tenderness from coughing with associated shortness of breath.  Patient reports subjective fever at home, none presently.  She denies any chest pain.  She reports close sick contacts at church.  Patient is a significant past medical history of lupus.  Patient is a non-smoker.  Pt reports using tylenol this morning.    Past Medical History:  Diagnosis Date  . Lupus (systemic lupus erythematosus) ([place])   . Obesity   . Renal stone     Patient Active Problem List   Diagnosis Date Noted  . Back pain at L4-L5 level 10/31/2014  . Muscle spasm of back 10/31/2014  . Breast cancer screening 09/30/2014  . Visit for screening mammogram 09/30/2014  . Chronic maxillary sinusitis 07/04/2014  . Occipital headache 07/04/2014  . Increased urinary frequency 05/23/2014  . Falls 05/23/2014  . Prediabetes 05/23/2014  . Rash and nonspecific skin eruption 05/23/2014  . Essential hypertension 04/16/2014  . Encounter for immunization 11/19/2013  . Lupus (systemic lupus erythematosus) ([place]) 09/24/2013  . CKD (chronic kidney disease) stage 4, GFR 15-29 ml/min ([name]) 07/02/2013  . TIA (transient ischemic attack) 05/05/2013  . Numbness and tingling of left side of face 05/04/2013  . Lupus 04/15/2011    No past surgical history on file.  OB History    No data available       Home Medications    Prior to Admission medications   Medication Sig Start Date End Date Taking? Authorizing  Provider  acetaminophen (TYLENOL) 500 MG tablet Take 1,000 mg by mouth every 6 (six) hours as needed for headache.   Yes [provider]  amLODipine (NORVASC) 10 MG tablet Take 1 tablet (10 mg total) by mouth daily. 12/19/14  Yes Tresa Garter, MD  aspirin 81 MG tablet Take 1 tablet (81 mg total) by mouth daily. 12/25/13  Yes Tresa Garter, MD  atenolol (TENORMIN) 100 MG tablet Take 100 mg by mouth daily.   Yes [provider]  calcitRIOL (ROCALTROL) 0.25 MCG capsule Take 0.25 mcg by mouth daily.   Yes [provider]  calcium carbonate (TUMS - DOSED IN MG ELEMENTAL CALCIUM) 500 MG chewable tablet Chew 1 tablet by mouth daily as needed for indigestion or heartburn.   Yes [provider]  cholecalciferol (VITAMIN D) 1000 UNITS tablet Take 2,000 Units by mouth daily.   Yes [provider]  furosemide (LASIX) 40 MG tablet Take 40 mg by mouth daily.   Yes [provider]  HYDROcodone-acetaminophen (NORCO) 7.5-325 MG per tablet Take 1 tablet by mouth every 6 (six) hours as needed for moderate pain or severe pain.  05/31/14  Yes [provider]  hydrocortisone cream 0.5 % Apply 1 application topically 2 (two) times daily. 12/19/14  Yes Tresa Garter, MD  hydroxychloroquine (PLAQUENIL) 200 MG tablet Take 1 tablet (200 mg total) by mouth 2 (two) times daily. 12/25/13  Yes Tresa Garter, MD  losartan (COZAAR) 100 MG  tablet Take 1 tablet (100 mg total) by mouth daily. 01/19/16  Yes Tresa Garter, MD  Multiple Vitamins-Minerals (MULTIVITAMIN & MINERAL PO) Take 1 tablet by mouth daily.   Yes [provider]  benzonatate (TESSALON) 100 MG capsule Take 1 capsule (100 mg total) by mouth every 8 (eight) hours. 02/09/17   Samuel Rittenhouse, Dellis Filbert, PA-C  fluticasone (FLONASE) 50 MCG/ACT nasal spray Place 1 spray into both nostrils daily. 02/09/17   Okey Regal, PA-C    Family History Family History  Problem Relation  Age of Onset  . Diabetes Mother   . Hypertension Father   . Cancer Sister   . Hypertension Brother     Social History Social History   Tobacco Use  . Smoking status: Never Smoker  . Smokeless tobacco: Never Used  Substance Use Topics  . Alcohol use: No  . Drug use: No     Allergies   Enalapril maleate; Chocolate; and Penicillins   Review of Systems Review of Systems  All other systems reviewed and are negative.    Physical Exam Updated Vital Signs BP 132/66 (BP Location: Right Arm)   Pulse (!) 57   Temp 98.5 F (36.9 C) (Oral)   Resp 18   SpO2 100%   Physical Exam  Constitutional: She is oriented to person, place, and time. She appears well-developed and well-nourished.  HENT:  Head: Normocephalic and atraumatic.  Eyes: Conjunctivae are normal. Pupils are equal, round, and reactive to light. Right eye exhibits no discharge. Left eye exhibits no discharge. No scleral icterus.  Neck: Normal range of motion. No JVD present. No tracheal deviation present.  Cardiovascular: Regular rhythm, normal heart sounds and intact distal pulses.  Pulmonary/Chest: Effort normal and breath sounds normal. No stridor. No respiratory distress. She has no wheezes. She has no rales. She exhibits no tenderness.  Neurological: She is alert and oriented to person, place, and time. Coordination normal.  Psychiatric: She has a normal mood and affect. Her behavior is normal. Judgment and thought content normal.  Nursing note and vitals reviewed.    ED Treatments / Results  Labs (all labs ordered are listed, but only abnormal results are displayed) Labs Reviewed - No data to display  EKG  EKG Interpretation None       Radiology Dg Chest 2 View  Result Date: 02/09/2017 CLINICAL DATA:  Cough and fever EXAM: CHEST  2 VIEW COMPARISON:  December 04, 2016 FINDINGS: There is scarring in the right base region. Lungs elsewhere are clear. Heart is mildly enlarged with pulmonary vascularity  within normal limits. No adenopathy. No evident bone lesions. IMPRESSION: Scarring right base. No edema or consolidation. Stable cardiac prominence. Electronically Signed   By: Lowella Grip III M.D.   On: 02/09/2017 10:25    Procedures Procedures (including critical care time)  Medications Ordered in ED Medications  albuterol (PROVENTIL) (2.5 MG/3ML) 0.083% nebulizer solution 5 mg (5 mg Nebulization Given 02/09/17 1119)     Initial Impression / Assessment and Plan / ED Course  I have reviewed the triage vital signs and the nursing notes.  Pertinent labs & imaging results that were available during my care of the patient were reviewed by me and considered in my medical decision making (see chart for details).      Final Clinical Impressions(s) / ED Diagnoses   Final diagnoses:  Viral URI with cough    61 year old female presents today with likely viral URI.  She is well-appearing in no acute distress.  She  is afebrile with no signs of tachycardia, 100% oxygen saturation and clear lung sounds.  Patient was given a breathing treatment prior to my evaluation, no acute wheeze was noted on exam per respiratory.  With slightly lower heart rate here, likely secondary to atenolol.  She has no signs of bacterial infection on my exam today discussed symptomatic care instructions and strict return precautions with both patient and the family.  The verbalized understanding and agreement to today's plan and had no further questions or concerns at the time of discharge.  ED Discharge Orders        Ordered    benzonatate (TESSALON) 100 MG capsule  Every 8 hours     02/09/17 1216    fluticasone (FLONASE) 50 MCG/ACT nasal spray  Daily     02/09/17 1217       Okey Regal, PA-C 02/09/17 1217    Jin Shockley, Dellis Filbert, PA-C 02/09/17 1230    Orlie Dakin, MD 02/09/17 [phone]

## 2017-02-15 DIAGNOSIS — N185 Chronic kidney disease, stage 5: Secondary | ICD-10-CM | POA: Insufficient documentation

## 2017-02-28 DIAGNOSIS — Z6835 Body mass index (BMI) 35.0-35.9, adult: Secondary | ICD-10-CM | POA: Diagnosis not present

## 2017-02-28 DIAGNOSIS — N2581 Secondary hyperparathyroidism of renal origin: Secondary | ICD-10-CM | POA: Diagnosis not present

## 2017-02-28 DIAGNOSIS — E669 Obesity, unspecified: Secondary | ICD-10-CM | POA: Diagnosis not present

## 2017-02-28 DIAGNOSIS — I129 Hypertensive chronic kidney disease with stage 1 through stage 4 chronic kidney disease, or unspecified chronic kidney disease: Secondary | ICD-10-CM | POA: Diagnosis not present

## 2017-02-28 DIAGNOSIS — N184 Chronic kidney disease, stage 4 (severe): Secondary | ICD-10-CM | POA: Diagnosis not present

## 2017-02-28 DIAGNOSIS — D649 Anemia, unspecified: Secondary | ICD-10-CM | POA: Diagnosis not present

## 2017-02-28 DIAGNOSIS — M3214 Glomerular disease in systemic lupus erythematosus: Secondary | ICD-10-CM | POA: Diagnosis not present

## 2017-02-28 DIAGNOSIS — R609 Edema, unspecified: Secondary | ICD-10-CM | POA: Diagnosis not present

## 2017-02-28 DIAGNOSIS — R8290 Unspecified abnormal findings in urine: Secondary | ICD-10-CM | POA: Diagnosis not present

## 2017-03-02 DIAGNOSIS — F331 Major depressive disorder, recurrent, moderate: Secondary | ICD-10-CM | POA: Diagnosis not present

## 2017-03-02 DIAGNOSIS — F4310 Post-traumatic stress disorder, unspecified: Secondary | ICD-10-CM | POA: Diagnosis not present

## 2017-03-09 ENCOUNTER — Other Ambulatory Visit (HOSPITAL_COMMUNITY): Payer: Self-pay

## 2017-03-09 DIAGNOSIS — F331 Major depressive disorder, recurrent, moderate: Secondary | ICD-10-CM | POA: Diagnosis not present

## 2017-03-09 DIAGNOSIS — F4310 Post-traumatic stress disorder, unspecified: Secondary | ICD-10-CM | POA: Diagnosis not present

## 2017-03-10 ENCOUNTER — Ambulatory Visit (HOSPITAL_COMMUNITY)
Admission: RE | Admit: 2017-03-10 | Discharge: 2017-03-10 | Disposition: A | Discharge status: Home / Self Care | Payer: Medicare Other | Source: Ambulatory Visit | Attending: Nephrology | Admitting: Nephrology

## 2017-03-10 DIAGNOSIS — N189 Chronic kidney disease, unspecified: Secondary | ICD-10-CM | POA: Diagnosis not present

## 2017-03-10 DIAGNOSIS — D631 Anemia in chronic kidney disease: Secondary | ICD-10-CM | POA: Insufficient documentation

## 2017-03-10 MED ORDER — SODIUM CHLORIDE 0.9 % IV SOLN
510.0000 mg | Freq: Once | INTRAVENOUS | Status: AC
  Administered 2017-03-10: 510 mg via INTRAVENOUS
  Filled 2017-03-10: qty 17

## 2017-03-10 NOTE — Discharge Instructions (Signed)

## 2017-03-16 DIAGNOSIS — F4310 Post-traumatic stress disorder, unspecified: Secondary | ICD-10-CM | POA: Diagnosis not present

## 2017-03-16 DIAGNOSIS — F331 Major depressive disorder, recurrent, moderate: Secondary | ICD-10-CM | POA: Diagnosis not present

## 2017-03-23 DIAGNOSIS — F331 Major depressive disorder, recurrent, moderate: Secondary | ICD-10-CM | POA: Diagnosis not present

## 2017-03-23 DIAGNOSIS — F4310 Post-traumatic stress disorder, unspecified: Secondary | ICD-10-CM | POA: Diagnosis not present

## 2017-03-30 DIAGNOSIS — F4310 Post-traumatic stress disorder, unspecified: Secondary | ICD-10-CM | POA: Diagnosis not present

## 2017-03-30 DIAGNOSIS — F331 Major depressive disorder, recurrent, moderate: Secondary | ICD-10-CM | POA: Diagnosis not present

## 2017-04-15 DIAGNOSIS — F4310 Post-traumatic stress disorder, unspecified: Secondary | ICD-10-CM | POA: Diagnosis not present

## 2017-04-15 DIAGNOSIS — F331 Major depressive disorder, recurrent, moderate: Secondary | ICD-10-CM | POA: Diagnosis not present

## 2017-05-17 ENCOUNTER — Ambulatory Visit (HOSPITAL_COMMUNITY)
Admission: EM | Admit: 2017-05-17 | Discharge: 2017-05-17 | Discharge status: Against Medical Advice | Payer: Medicare Other | Attending: Family Medicine | Admitting: Family Medicine

## 2017-05-17 DIAGNOSIS — D631 Anemia in chronic kidney disease: Secondary | ICD-10-CM | POA: Diagnosis not present

## 2017-05-17 DIAGNOSIS — Z6835 Body mass index (BMI) 35.0-35.9, adult: Secondary | ICD-10-CM | POA: Diagnosis not present

## 2017-05-17 DIAGNOSIS — Z5321 Procedure and treatment not carried out due to patient leaving prior to being seen by health care provider: Secondary | ICD-10-CM | POA: Diagnosis not present

## 2017-05-17 DIAGNOSIS — R609 Edema, unspecified: Secondary | ICD-10-CM | POA: Diagnosis not present

## 2017-05-17 DIAGNOSIS — I129 Hypertensive chronic kidney disease with stage 1 through stage 4 chronic kidney disease, or unspecified chronic kidney disease: Secondary | ICD-10-CM | POA: Diagnosis not present

## 2017-05-17 DIAGNOSIS — N2581 Secondary hyperparathyroidism of renal origin: Secondary | ICD-10-CM | POA: Diagnosis not present

## 2017-05-17 DIAGNOSIS — E669 Obesity, unspecified: Secondary | ICD-10-CM | POA: Diagnosis not present

## 2017-05-17 DIAGNOSIS — N184 Chronic kidney disease, stage 4 (severe): Secondary | ICD-10-CM | POA: Diagnosis not present

## 2017-05-17 DIAGNOSIS — M3214 Glomerular disease in systemic lupus erythematosus: Secondary | ICD-10-CM | POA: Diagnosis not present

## 2017-05-17 NOTE — ED Notes (Signed)
Patient registration staff advised that patient has not answered and has apparently left.

## 2017-05-18 ENCOUNTER — Emergency Department (HOSPITAL_COMMUNITY)
Admission: EM | Admit: 2017-05-18 | Discharge: 2017-05-18 | Disposition: A | Discharge status: Home / Self Care | Payer: Medicare Other | Attending: Emergency Medicine | Admitting: Emergency Medicine

## 2017-05-18 ENCOUNTER — Encounter (HOSPITAL_COMMUNITY): Payer: Self-pay | Admitting: Emergency Medicine

## 2017-05-18 ENCOUNTER — Emergency Department (HOSPITAL_COMMUNITY): Payer: Medicare Other

## 2017-05-18 ENCOUNTER — Other Ambulatory Visit: Payer: Self-pay

## 2017-05-18 DIAGNOSIS — N184 Chronic kidney disease, stage 4 (severe): Secondary | ICD-10-CM | POA: Insufficient documentation

## 2017-05-18 DIAGNOSIS — E669 Obesity, unspecified: Secondary | ICD-10-CM | POA: Diagnosis not present

## 2017-05-18 DIAGNOSIS — Z79899 Other long term (current) drug therapy: Secondary | ICD-10-CM | POA: Insufficient documentation

## 2017-05-18 DIAGNOSIS — I129 Hypertensive chronic kidney disease with stage 1 through stage 4 chronic kidney disease, or unspecified chronic kidney disease: Secondary | ICD-10-CM | POA: Insufficient documentation

## 2017-05-18 DIAGNOSIS — M25561 Pain in right knee: Secondary | ICD-10-CM | POA: Diagnosis not present

## 2017-05-18 DIAGNOSIS — M170 Bilateral primary osteoarthritis of knee: Secondary | ICD-10-CM | POA: Diagnosis not present

## 2017-05-18 DIAGNOSIS — R29818 Other symptoms and signs involving the nervous system: Secondary | ICD-10-CM | POA: Diagnosis not present

## 2017-05-18 DIAGNOSIS — F3289 Other specified depressive episodes: Secondary | ICD-10-CM | POA: Diagnosis not present

## 2017-05-18 DIAGNOSIS — S0990XA Unspecified injury of head, initial encounter: Secondary | ICD-10-CM

## 2017-05-18 DIAGNOSIS — M546 Pain in thoracic spine: Secondary | ICD-10-CM | POA: Diagnosis not present

## 2017-05-18 DIAGNOSIS — Z7982 Long term (current) use of aspirin: Secondary | ICD-10-CM | POA: Insufficient documentation

## 2017-05-18 DIAGNOSIS — M150 Primary generalized (osteo)arthritis: Secondary | ICD-10-CM | POA: Diagnosis not present

## 2017-05-18 DIAGNOSIS — Z6835 Body mass index (BMI) 35.0-35.9, adult: Secondary | ICD-10-CM | POA: Diagnosis not present

## 2017-05-18 DIAGNOSIS — R51 Headache: Secondary | ICD-10-CM | POA: Insufficient documentation

## 2017-05-18 DIAGNOSIS — M3214 Glomerular disease in systemic lupus erythematosus: Secondary | ICD-10-CM | POA: Diagnosis not present

## 2017-05-18 DIAGNOSIS — M329 Systemic lupus erythematosus, unspecified: Secondary | ICD-10-CM | POA: Diagnosis not present

## 2017-05-18 NOTE — ED Provider Notes (Signed)
[facility] EMERGENCY DEPARTMENT Provider Note   CSN: [id number] Arrival date & time: 05/18/17  1723     History   Chief Complaint Chief Complaint  Patient presents with  . Head Injury    HPI Jenna [name] is a 62 y.o. female.  HPI    62 year old female presents today status post fall.  Patient reports yesterday she was walking on her driveway when she fell striking her head and her knees.  She notes that she has minimal pain to the head, no neurological deficits, denies any neck pain, denies any loss of consciousness, reports minor pain to her bilateral knees.  Patient denies any other complaints here today.  Patient is not on anticoagulation.    Past Medical History:  Diagnosis Date  . Lupus (systemic lupus erythematosus) ([place])   . Obesity   . Renal stone     Patient Active Problem List   Diagnosis Date Noted  . Back pain at L4-L5 level 10/31/2014  . Muscle spasm of back 10/31/2014  . Breast cancer screening 09/30/2014  . Visit for screening mammogram 09/30/2014  . Chronic maxillary sinusitis 07/04/2014  . Occipital headache 07/04/2014  . Increased urinary frequency 05/23/2014  . Falls 05/23/2014  . Prediabetes 05/23/2014  . Rash and nonspecific skin eruption 05/23/2014  . Essential hypertension 04/16/2014  . Encounter for immunization 11/19/2013  . Lupus (systemic lupus erythematosus) ([place]) 09/24/2013  . CKD (chronic kidney disease) stage 4, GFR 15-29 ml/min ([name]) 07/02/2013  . TIA (transient ischemic attack) 05/05/2013  . Numbness and tingling of left side of face 05/04/2013  . Lupus ([place]) 04/15/2011    History reviewed. No pertinent surgical history.   OB History   None      Home Medications    Prior to Admission medications   Medication Sig Start Date End Date Taking? Authorizing Provider  acetaminophen (TYLENOL) 500 MG tablet Take 1,000 mg by mouth every 6 (six) hours as needed for headache.    [provider]    amLODipine (NORVASC) 10 MG tablet Take 1 tablet (10 mg total) by mouth daily. 12/19/14   Tresa Garter, MD  aspirin 81 MG tablet Take 1 tablet (81 mg total) by mouth daily. 12/25/13   Tresa Garter, MD  atenolol (TENORMIN) 100 MG tablet Take 100 mg by mouth daily.    [provider]  benzonatate (TESSALON) 100 MG capsule Take 1 capsule (100 mg total) by mouth every 8 (eight) hours. 02/09/17   Cilicia Borden, Dellis Filbert, PA-C  calcitRIOL (ROCALTROL) 0.25 MCG capsule Take 0.25 mcg by mouth daily.    [provider]  calcium carbonate (TUMS - DOSED IN MG ELEMENTAL CALCIUM) 500 MG chewable tablet Chew 1 tablet by mouth daily as needed for indigestion or heartburn.    [provider]  cholecalciferol (VITAMIN D) 1000 UNITS tablet Take 2,000 Units by mouth daily.    [provider]  fluticasone (FLONASE) 50 MCG/ACT nasal spray Place 1 spray into both nostrils daily. 02/09/17   Jerran Tappan, Dellis Filbert, PA-C  furosemide (LASIX) 40 MG tablet Take 40 mg by mouth daily.    [provider]  HYDROcodone-acetaminophen (NORCO) 7.5-325 MG per tablet Take 1 tablet by mouth every 6 (six) hours as needed for moderate pain or severe pain.  05/31/14   [provider]  hydrocortisone cream 0.5 % Apply 1 application topically 2 (two) times daily. 12/19/14   Tresa Garter, MD  hydroxychloroquine (PLAQUENIL) 200 MG tablet Take 1 tablet (200  mg total) by mouth 2 (two) times daily. 12/25/13   Tresa Garter, MD  losartan (COZAAR) 100 MG tablet Take 1 tablet (100 mg total) by mouth daily. 01/19/16   Tresa Garter, MD  Multiple Vitamins-Minerals (MULTIVITAMIN & MINERAL PO) Take 1 tablet by mouth daily.    [provider]    Family History Family History  Problem Relation Age of Onset  . Diabetes Mother   . Hypertension Father   . Cancer Sister   . Hypertension Brother     Social History Social History   Tobacco Use  . Smoking status: Never  Smoker  . Smokeless tobacco: Never Used  Substance Use Topics  . Alcohol use: No  . Drug use: No     Allergies   Enalapril maleate; Chocolate; and Penicillins   Review of Systems Review of Systems  All other systems reviewed and are negative.  Physical Exam Updated Vital Signs BP 127/84 (BP Location: Right Arm)   Pulse (!) 56   Temp 98.9 F (37.2 C) (Oral)   Resp 18   Ht $R'5\' 7"'Yb$  (1.702 m)   Wt 99.8 kg (220 lb)   SpO2 100%   BMI 34.46 kg/m   Physical Exam  Constitutional: She is oriented to person, place, and time. She appears well-developed and well-nourished.  HENT:  Head: Normocephalic and atraumatic.  Eyes: Pupils are equal, round, and reactive to light. Conjunctivae are normal. Right eye exhibits no discharge. Left eye exhibits no discharge. No scleral icterus.  Neck: Normal range of motion. No JVD present. No tracheal deviation present.  Pulmonary/Chest: Effort normal. No stridor.  Musculoskeletal:  No C-spine tenderness palpation  Neurological: She is alert and oriented to person, place, and time. No cranial nerve deficit or sensory deficit. She exhibits normal muscle tone. Coordination normal.  Psychiatric: She has a normal mood and affect. Her behavior is normal. Judgment and thought content normal.  Nursing note and vitals reviewed.   ED Treatments / Results  Labs (all labs ordered are listed, but only abnormal results are displayed) Labs Reviewed - No data to display  EKG None  Radiology Ct Head Wo Contrast  Result Date: 05/18/2017 CLINICAL DATA:  Headache, acute.  Normal neuro exam. EXAM: CT HEAD WITHOUT CONTRAST TECHNIQUE: Contiguous axial images were obtained from the base of the skull through the vertex without intravenous contrast. COMPARISON:  Brain MRI 05/05/2013 FINDINGS: Brain: No evidence of acute infarction, hemorrhage, hydrocephalus, extra-axial collection or mass lesion/mass effect. Partially empty sella, usually incidental in isolation.  Vascular: Atherosclerotic calcification.  No high-density vessel. Skull: Negative Sinuses/Orbits: Negative IMPRESSION: No acute finding or explanation for headache. Electronically Signed   By: Monte Fantasia M.D.   On: 05/18/2017 18:29    Procedures Procedures (including critical care time)  Medications Ordered in ED Medications - No data to display   Initial Impression / Assessment and Plan / ED Course  I have reviewed the triage vital signs and the nursing notes.  Pertinent labs & imaging results that were available during my care of the patient were reviewed by me and considered in my medical decision making (see chart for details).      Final Clinical Impressions(s) / ED Diagnoses   Final diagnoses:  Injury of head, initial encounter    Labs:   Imaging: Ct head  Consults:  Therapeutics:  Discharge Meds:   Assessment/Plan: 62 year old female presents today with complaints of fall. No signs of trauma. Pt will not answer all questions and is  irritated with my attempts at exam and questioning; unable to completely evaluate the patient other than neuro and upper extremity visual.  Pt with no sings of trauma, normal head CT, no neuro deficit. Pt is stable for outpatient follow up. Return precautions given.     ED Discharge Orders    None       Francee Gentile 05/18/17 2053    Sherwood Gambler, MD 05/19/17 2250

## 2017-05-18 NOTE — ED Triage Notes (Signed)
Pt to ED for concussion-like symptoms - patient reports she was walking yesterday and fell, believes she tripped on uneven ground, denies LOC, but states she did hit her head on the R side. No obvious injury noted, but pt endorses H/A since, nausea this morning, and short-term memory loss. Patient is currently A&O x 4. Denies blood thinners.

## 2017-05-18 NOTE — ED Provider Notes (Signed)
Patient placed in Quick Look pathway, seen and evaluated   Chief Complaint: head trauma  HPI:   Pt presenting with complaint of head injury that occurred yesterday. States she fell and hit her right forehead on the cement yesterday. No LOC. Reports dull aching HA that is worse when she moves her head. States she has had memory issues since fall. Reports assoc photophobia. Denies nausea, vomiting, neck pain, back pain.   ROS: + HA, - vision changes, - vomiting  Physical Exam:   Gen: No distress  Neuro: Awake and Alert  Skin: Warm    Focused Exam: No scalp hematoma Mental Status:  Alert, oriented, thought content appropriate, able to give a coherent history. Speech fluent without evidence of aphasia. Able to follow 2 step commands without difficulty.  Cranial Nerves:  II:  Peripheral visual fields grossly normal, pupils equal, round, reactive to light III,IV, VI: ptosis not present, extra-ocular motions intact bilaterally  V,VII: smile symmetric, facial light touch sensation equal VIII: hearing grossly normal to voice  X: uvula elevates symmetrically  XI: bilateral shoulder shrug symmetric and strong XII: midline tongue extension without fassiculations Motor:  Normal tone. 5/5 in upper and lower extremities bilaterally including strong and equal grip strength and dorsiflexion/plantar flexion Sensory: Pinprick and light touch normal in all extremities.  Deep Tendon Reflexes: 2+ and symmetric in the biceps and patella Cerebellar: normal finger-to-nose with bilateral upper extremities Gait: normal gait and balance CV: distal pulses palpable throughout   Initiation of care has begun. The patient has been counseled on the process, plan, and necessity for staying for the completion/evaluation, and the remainder of the medical screening examination    Joyanna Kleman, [name] N, PA-C 05/18/17 1747    Little, Wenda Overland, MD 05/19/17 [phone]

## 2017-05-18 NOTE — Discharge Instructions (Addendum)
Please read attached information. If you experience any new or worsening signs or symptoms please return to the emergency room for evaluation. Please follow-up with your primary care provider or specialist as discussed.  °

## 2017-06-15 DIAGNOSIS — Z992 Dependence on renal dialysis: Secondary | ICD-10-CM | POA: Diagnosis not present

## 2017-06-15 DIAGNOSIS — R2681 Unsteadiness on feet: Secondary | ICD-10-CM | POA: Diagnosis not present

## 2017-06-15 DIAGNOSIS — N186 End stage renal disease: Secondary | ICD-10-CM | POA: Diagnosis not present

## 2017-06-15 DIAGNOSIS — M3214 Glomerular disease in systemic lupus erythematosus: Secondary | ICD-10-CM | POA: Diagnosis not present

## 2017-06-23 ENCOUNTER — Encounter (HOSPITAL_COMMUNITY): Payer: Self-pay | Admitting: Emergency Medicine

## 2017-06-23 ENCOUNTER — Emergency Department (HOSPITAL_COMMUNITY): Payer: Medicare Other

## 2017-06-23 ENCOUNTER — Emergency Department (HOSPITAL_COMMUNITY)
Admission: EM | Admit: 2017-06-23 | Discharge: 2017-06-23 | Disposition: A | Discharge status: Home / Self Care | Payer: Medicare Other | Attending: Emergency Medicine | Admitting: Emergency Medicine

## 2017-06-23 DIAGNOSIS — R001 Bradycardia, unspecified: Secondary | ICD-10-CM

## 2017-06-23 DIAGNOSIS — Z7982 Long term (current) use of aspirin: Secondary | ICD-10-CM | POA: Insufficient documentation

## 2017-06-23 DIAGNOSIS — R531 Weakness: Secondary | ICD-10-CM | POA: Diagnosis not present

## 2017-06-23 DIAGNOSIS — N184 Chronic kidney disease, stage 4 (severe): Secondary | ICD-10-CM | POA: Diagnosis not present

## 2017-06-23 DIAGNOSIS — I499 Cardiac arrhythmia, unspecified: Secondary | ICD-10-CM | POA: Diagnosis not present

## 2017-06-23 DIAGNOSIS — E875 Hyperkalemia: Secondary | ICD-10-CM

## 2017-06-23 DIAGNOSIS — Z79899 Other long term (current) drug therapy: Secondary | ICD-10-CM | POA: Insufficient documentation

## 2017-06-23 DIAGNOSIS — R296 Repeated falls: Secondary | ICD-10-CM | POA: Diagnosis not present

## 2017-06-23 DIAGNOSIS — R2681 Unsteadiness on feet: Secondary | ICD-10-CM | POA: Diagnosis not present

## 2017-06-23 DIAGNOSIS — I447 Left bundle-branch block, unspecified: Secondary | ICD-10-CM | POA: Diagnosis not present

## 2017-06-23 LAB — CBC
HCT: 28.4 % — ABNORMAL LOW (ref 36.0–46.0)
Hemoglobin: 9.2 g/dL — ABNORMAL LOW (ref 12.0–15.0)
MCH: 28.5 pg (ref 26.0–34.0)
MCHC: 32.4 g/dL (ref 30.0–36.0)
MCV: 87.9 fL (ref 78.0–100.0)
PLATELETS: 198 10*3/uL (ref 150–400)
RBC: 3.23 MIL/uL — ABNORMAL LOW (ref 3.87–5.11)
RDW: 14.3 % (ref 11.5–15.5)
WBC: 4.5 10*3/uL (ref 4.0–10.5)

## 2017-06-23 LAB — BASIC METABOLIC PANEL
Anion gap: 10 (ref 5–15)
BUN: 88 mg/dL — ABNORMAL HIGH (ref 6–20)
CALCIUM: 9.9 mg/dL (ref 8.9–10.3)
CO2: 21 mmol/L — ABNORMAL LOW (ref 22–32)
CREATININE: 5.7 mg/dL — AB (ref 0.44–1.00)
Chloride: 101 mmol/L (ref 101–111)
GFR, EST AFRICAN AMERICAN: 8 mL/min — AB (ref >60)
GFR, EST NON AFRICAN AMERICAN: 7 mL/min — AB (ref >60)
Glucose, Bld: 97 mg/dL (ref 65–99)
Potassium: 5.8 mmol/L — ABNORMAL HIGH (ref 3.5–5.1)
SODIUM: 132 mmol/L — AB (ref 135–145)

## 2017-06-23 LAB — I-STAT TROPONIN, ED
TROPONIN I, POC: 0 ng/mL (ref 0.00–0.08)
Troponin i, poc: 0.02 ng/mL (ref 0.00–0.08)

## 2017-06-23 MED ORDER — HYDROXYCHLOROQUINE SULFATE 200 MG PO TABS
200.0000 mg | ORAL_TABLET | Freq: Once | ORAL | Status: AC
  Administered 2017-06-23: 200 mg via ORAL
  Filled 2017-06-23 (×2): qty 1

## 2017-06-23 NOTE — ED Triage Notes (Signed)
Per EMS, pt was at rehab for falls when she had a new onset of bradycardia in the 40s. Left bundle branch block, sinus brady per EMS EKG. Pt reported intermittent left arm pain. Pt reported sob and nausea which has now resolved. Hx of Lupus, no cardiac hx per pt. Pt A&Ox4, ambulatory with assistance - pt reports unsteadiness.   EMS VS BP 124/96, P 45, 96% room air, RR 18.

## 2017-06-23 NOTE — ED Provider Notes (Signed)
[facility] EMERGENCY DEPARTMENT Provider Note   CSN: [id number] Arrival date & time: 06/23/17  1535     History   Chief Complaint Chief Complaint  Patient presents with  . Bradycardia  . Shortness of Breath    HPI Jenna [name] is a 62 y.o. female.  62 yo F with a chief complaint of weakness.  The patient feels that she is only able to walk a few steps without becoming very weak and have to take a break.  This been going on for the past couple weeks.  She denies cough congestion or fever denies vomiting or diarrhea.  The patient has had symptoms like this off and on for the past few months.  She got an iron infusion at one point and had some significant improvement.  She thinks this feels similar but not as bad.  She denies any lower extremity edema.  She went to physical therapy today because she has been having some trouble ambulating due to arthralgias from her lupus.  While there they noted that she had a bradycardia and they called EMS.  She was then brought to the emergency department.  The history is provided by the patient.  Shortness of Breath  This is a new problem. Pertinent negatives include no fever, no headaches, no rhinorrhea, no wheezing, no chest pain, no vomiting and no abdominal pain.  Illness  This is a new problem. The current episode started 2 days ago. The problem occurs constantly. The problem has not changed since onset.Associated symptoms include shortness of breath. Pertinent negatives include no chest pain, no abdominal pain and no headaches. The symptoms are aggravated by walking. The symptoms are relieved by rest. She has tried rest for the symptoms. The treatment provided significant relief.    Past Medical History:  Diagnosis Date  . Lupus (systemic lupus erythematosus) ([place])   . Obesity   . Renal stone     Patient Active Problem List   Diagnosis Date Noted  . Back pain at L4-L5 level 10/31/2014  . Muscle spasm of back  10/31/2014  . Breast cancer screening 09/30/2014  . Visit for screening mammogram 09/30/2014  . Chronic maxillary sinusitis 07/04/2014  . Occipital headache 07/04/2014  . Increased urinary frequency 05/23/2014  . Falls 05/23/2014  . Prediabetes 05/23/2014  . Rash and nonspecific skin eruption 05/23/2014  . Essential hypertension 04/16/2014  . Encounter for immunization 11/19/2013  . Lupus (systemic lupus erythematosus) ([place]) 09/24/2013  . CKD (chronic kidney disease) stage 4, GFR 15-29 ml/min ([name]) 07/02/2013  . TIA (transient ischemic attack) 05/05/2013  . Numbness and tingling of left side of face 05/04/2013  . Lupus ([place]) 04/15/2011    History reviewed. No pertinent surgical history.   OB History   None      Home Medications    Prior to Admission medications   Medication Sig Start Date End Date Taking? Authorizing Provider  acetaminophen (TYLENOL) 500 MG tablet Take 1,000 mg by mouth every 6 (six) hours as needed for headache.    [provider]  amLODipine (NORVASC) 10 MG tablet Take 1 tablet (10 mg total) by mouth daily. 12/19/14   Tresa Garter, MD  aspirin 81 MG tablet Take 1 tablet (81 mg total) by mouth daily. 12/25/13   Tresa Garter, MD  atenolol (TENORMIN) 100 MG tablet Take 100 mg by mouth daily.    [provider]  benzonatate (TESSALON) 100 MG capsule Take 1 capsule (100 mg total) by  mouth every 8 (eight) hours. 02/09/17   Hedges, Dellis Filbert, PA-C  calcitRIOL (ROCALTROL) 0.25 MCG capsule Take 0.25 mcg by mouth daily.    [provider]  calcium carbonate (TUMS - DOSED IN MG ELEMENTAL CALCIUM) 500 MG chewable tablet Chew 1 tablet by mouth daily as needed for indigestion or heartburn.    [provider]  cholecalciferol (VITAMIN D) 1000 UNITS tablet Take 2,000 Units by mouth daily.    [provider]  fluticasone (FLONASE) 50 MCG/ACT nasal spray Place 1 spray into both nostrils daily. 02/09/17   Hedges, Dellis Filbert,  PA-C  furosemide (LASIX) 40 MG tablet Take 40 mg by mouth daily.    [provider]  HYDROcodone-acetaminophen (NORCO) 7.5-325 MG per tablet Take 1 tablet by mouth every 6 (six) hours as needed for moderate pain or severe pain.  05/31/14   [provider]  hydrocortisone cream 0.5 % Apply 1 application topically 2 (two) times daily. 12/19/14   Tresa Garter, MD  hydroxychloroquine (PLAQUENIL) 200 MG tablet Take 1 tablet (200 mg total) by mouth 2 (two) times daily. 12/25/13   Tresa Garter, MD  losartan (COZAAR) 100 MG tablet Take 1 tablet (100 mg total) by mouth daily. 01/19/16   Tresa Garter, MD  Multiple Vitamins-Minerals (MULTIVITAMIN & MINERAL PO) Take 1 tablet by mouth daily.    [provider]    Family History Family History  Problem Relation Age of Onset  . Diabetes Mother   . Hypertension Father   . Cancer Sister   . Hypertension Brother     Social History Social History   Tobacco Use  . Smoking status: Never Smoker  . Smokeless tobacco: Never Used  Substance Use Topics  . Alcohol use: No  . Drug use: No     Allergies   Enalapril maleate; Chocolate; and Penicillins   Review of Systems Review of Systems  Constitutional: Negative for chills and fever.  HENT: Negative for congestion and rhinorrhea.   Eyes: Negative for redness and visual disturbance.  Respiratory: Positive for shortness of breath. Negative for wheezing.   Cardiovascular: Negative for chest pain and palpitations.  Gastrointestinal: Negative for abdominal pain, nausea and vomiting.  Genitourinary: Negative for dysuria and urgency.  Musculoskeletal: Negative for arthralgias and myalgias.  Skin: Negative for pallor and wound.  Neurological: Negative for dizziness and headaches.     Physical Exam Updated Vital Signs BP 137/89   Pulse 90   Temp 97.7 F (36.5 C) (Oral)   Resp 14   Ht '5\' 7"'  (1.702 m)   Wt 102.1 kg (225 lb)   SpO2 100%   BMI 35.24  kg/m   Physical Exam  Constitutional: She is oriented to person, place, and time. She appears well-developed and well-nourished. No distress.  HENT:  Head: Normocephalic and atraumatic.  Eyes: Pupils are equal, round, and reactive to light. EOM are normal.  Neck: Normal range of motion. Neck supple.  Cardiovascular: Normal rate and regular rhythm. Exam reveals no gallop and no friction rub.  No murmur heard. Pulmonary/Chest: Effort normal. She has no wheezes. She has no rales.  Abdominal: Soft. She exhibits no distension and no mass. There is no tenderness. There is no guarding.  Musculoskeletal: She exhibits no edema or tenderness.  Neurological: She is alert and oriented to person, place, and time.  Skin: Skin is warm and dry. She is not diaphoretic.  Psychiatric: She has a normal mood and affect. Her behavior is normal.  Nursing note  and vitals reviewed.    ED Treatments / Results  Labs (all labs ordered are listed, but only abnormal results are displayed) Labs Reviewed  BASIC METABOLIC PANEL - Abnormal; Notable for the following components:      Result Value   Sodium 132 (*)    Potassium 5.8 (*)    CO2 21 (*)    BUN 88 (*)    Creatinine, Ser 5.70 (*)    GFR calc non Af Amer 7 (*)    GFR calc Af Amer 8 (*)    All other components within normal limits  CBC - Abnormal; Notable for the following components:   RBC 3.23 (*)    Hemoglobin 9.2 (*)    HCT 28.4 (*)    All other components within normal limits  I-STAT TROPONIN, ED  I-STAT TROPONIN, ED    EKG EKG Interpretation  Date/Time:  Thursday Jun 23 2017 15:54:06 EDT Ventricular Rate:  52 PR Interval:    QRS Duration: 133 QT Interval:  471 QTC Calculation: 438 R Axis:   -39 Text Interpretation:  Sinus rhythm Left bundle branch block No significant change since last tracing Confirmed by Deno Etienne [phone]) on 06/23/2017 4:22:43 PM   Radiology Dg Chest 2 View  Result Date: 06/23/2017 CLINICAL DATA:  Per EMS, pt  was at rehab for falls when she had a new onset of bradycardia in the 40s. Left bundle branch block, sinus brady per EMS EKG. Pt reported intermittent left arm pain. EXAM: CHEST - 2 VIEW COMPARISON:  02/09/2017 FINDINGS: The cardiopericardial silhouette is enlarged and increased in prominence compared to prior studies but also accentuated by the AP position. There is no pulmonary edema. There are no focal consolidations or pleural effusions. Visualized osseous structures have a normal appearance. IMPRESSION: Enlarged cardiopericardial silhouette without edema. Findings raise the question of pericardial effusion. Electronically Signed   By: Nolon Nations M.D.   On: 06/23/2017 17:09    Procedures Procedures (including critical care time)  Medications Ordered in ED Medications  hydroxychloroquine (PLAQUENIL) tablet 200 mg (200 mg Oral Given 06/23/17 1938)     Initial Impression / Assessment and Plan / ED Course  I have reviewed the triage vital signs and the nursing notes.  Pertinent labs & imaging results that were available during my care of the patient were reviewed by me and considered in my medical decision making (see chart for details).     62 yo F with a cc of weakness.  Has been going on for the past couple weeks.  She has been having this issue off and on for months.  Her initial labs are concerning for a acute kidney injury.  She has a very mild hyperkalemia.  EKG with a sinus bradycardia.  I discussed the case with Dr. Moshe Cipro.  She did not feel that the patient met any criteria for emergent dialysis.  She felt that she could follow-up with her primary nephrologist and she would have the office contact her likely tomorrow to schedule an appointment.  With her hyperkalemia without EKG changes she recommended holding her losartan.  We will have her discuss her atenolol use as well as her worsening renal function.  11:40 PM:  I have discussed the diagnosis/risks/treatment options  with the patient and family and believe the pt to be eligible for discharge home to follow-up with Nephrology. We also discussed returning to the ED immediately if new or worsening sx occur. We discussed the sx which are most concerning (e.g.,  sudden worsening pain, fever, inability to tolerate by mouth ) that necessitate immediate return. Medications administered to the patient during their visit and any new prescriptions provided to the patient are listed below.  Medications given during this visit Medications  hydroxychloroquine (PLAQUENIL) tablet 200 mg (200 mg Oral Given 06/23/17 1938)    cxr reviewed by me with enlarged cardiac silhouette but without edema.   The patient appears reasonably screen and/or stabilized for discharge and I doubt any other medical condition or other [facility] requiring further screening, evaluation, or treatment in the ED at this time prior to discharge.    Final Clinical Impressions(s) / ED Diagnoses   Final diagnoses:  Bradycardia  Weakness  Hyperkalemia    ED Discharge Orders    None       Deno Etienne, DO 06/23/17 2340

## 2017-06-23 NOTE — Discharge Instructions (Addendum)
You have a sinus bradycardia.  This could be due to your atenolol.  Please discuss this with your nephrologist.  Today your metabolic panel showed that you have a BUN of 88 and a creatinine of 5.7.  Please discuss this with your nephrologist as well.  They may need to start the process of thinking about dialysis.  Your potassium was very mildly elevated, please have this rechecked in a few days

## 2017-06-23 NOTE — ED Notes (Signed)
Asking for food and socks

## 2017-06-23 NOTE — ED Notes (Signed)
Pt verbalizes understanding of d/c instructions. Pt taken to lobby in wheelchair at d/c with all belongings.   

## 2017-06-27 ENCOUNTER — Emergency Department (HOSPITAL_COMMUNITY): Payer: Medicare Other

## 2017-06-27 ENCOUNTER — Inpatient Hospital Stay (HOSPITAL_COMMUNITY)
Admission: EM | Admit: 2017-06-27 | Discharge: 2017-07-05 | DRG: 673 | Disposition: A | Discharge status: Skilled Nursing Facility | Payer: Medicare Other | Attending: Internal Medicine | Admitting: Internal Medicine

## 2017-06-27 ENCOUNTER — Encounter (HOSPITAL_COMMUNITY): Payer: Self-pay

## 2017-06-27 ENCOUNTER — Other Ambulatory Visit: Payer: Self-pay

## 2017-06-27 DIAGNOSIS — Z6833 Body mass index (BMI) 33.0-33.9, adult: Secondary | ICD-10-CM | POA: Diagnosis not present

## 2017-06-27 DIAGNOSIS — N269 Renal sclerosis, unspecified: Secondary | ICD-10-CM | POA: Diagnosis present

## 2017-06-27 DIAGNOSIS — N051 Unspecified nephritic syndrome with focal and segmental glomerular lesions: Secondary | ICD-10-CM

## 2017-06-27 DIAGNOSIS — M329 Systemic lupus erythematosus, unspecified: Secondary | ICD-10-CM | POA: Diagnosis not present

## 2017-06-27 DIAGNOSIS — M3214 Glomerular disease in systemic lupus erythematosus: Secondary | ICD-10-CM | POA: Diagnosis present

## 2017-06-27 DIAGNOSIS — Z87448 Personal history of other diseases of urinary system: Secondary | ICD-10-CM | POA: Diagnosis present

## 2017-06-27 DIAGNOSIS — R110 Nausea: Secondary | ICD-10-CM | POA: Diagnosis not present

## 2017-06-27 DIAGNOSIS — E669 Obesity, unspecified: Secondary | ICD-10-CM | POA: Diagnosis present

## 2017-06-27 DIAGNOSIS — D631 Anemia in chronic kidney disease: Secondary | ICD-10-CM | POA: Diagnosis present

## 2017-06-27 DIAGNOSIS — N186 End stage renal disease: Secondary | ICD-10-CM | POA: Diagnosis present

## 2017-06-27 DIAGNOSIS — I10 Essential (primary) hypertension: Secondary | ICD-10-CM | POA: Diagnosis not present

## 2017-06-27 DIAGNOSIS — G4700 Insomnia, unspecified: Secondary | ICD-10-CM | POA: Diagnosis not present

## 2017-06-27 DIAGNOSIS — Z4901 Encounter for fitting and adjustment of extracorporeal dialysis catheter: Secondary | ICD-10-CM | POA: Diagnosis not present

## 2017-06-27 DIAGNOSIS — N179 Acute kidney failure, unspecified: Secondary | ICD-10-CM | POA: Diagnosis not present

## 2017-06-27 DIAGNOSIS — Z8739 Personal history of other diseases of the musculoskeletal system and connective tissue: Secondary | ICD-10-CM | POA: Diagnosis present

## 2017-06-27 DIAGNOSIS — Z992 Dependence on renal dialysis: Secondary | ICD-10-CM | POA: Diagnosis not present

## 2017-06-27 DIAGNOSIS — G459 Transient cerebral ischemic attack, unspecified: Secondary | ICD-10-CM | POA: Diagnosis not present

## 2017-06-27 DIAGNOSIS — M25532 Pain in left wrist: Secondary | ICD-10-CM | POA: Diagnosis not present

## 2017-06-27 DIAGNOSIS — Z8673 Personal history of transient ischemic attack (TIA), and cerebral infarction without residual deficits: Secondary | ICD-10-CM

## 2017-06-27 DIAGNOSIS — R52 Pain, unspecified: Secondary | ICD-10-CM

## 2017-06-27 DIAGNOSIS — Z7982 Long term (current) use of aspirin: Secondary | ICD-10-CM

## 2017-06-27 DIAGNOSIS — J320 Chronic maxillary sinusitis: Secondary | ICD-10-CM | POA: Diagnosis not present

## 2017-06-27 DIAGNOSIS — M6281 Muscle weakness (generalized): Secondary | ICD-10-CM | POA: Diagnosis not present

## 2017-06-27 DIAGNOSIS — M19032 Primary osteoarthritis, left wrist: Secondary | ICD-10-CM | POA: Diagnosis not present

## 2017-06-27 DIAGNOSIS — N189 Chronic kidney disease, unspecified: Secondary | ICD-10-CM | POA: Diagnosis not present

## 2017-06-27 DIAGNOSIS — Z79899 Other long term (current) drug therapy: Secondary | ICD-10-CM

## 2017-06-27 DIAGNOSIS — N2581 Secondary hyperparathyroidism of renal origin: Secondary | ICD-10-CM | POA: Diagnosis present

## 2017-06-27 DIAGNOSIS — Z01810 Encounter for preprocedural cardiovascular examination: Secondary | ICD-10-CM | POA: Diagnosis not present

## 2017-06-27 DIAGNOSIS — I951 Orthostatic hypotension: Secondary | ICD-10-CM | POA: Diagnosis not present

## 2017-06-27 DIAGNOSIS — I120 Hypertensive chronic kidney disease with stage 5 chronic kidney disease or end stage renal disease: Principal | ICD-10-CM | POA: Diagnosis present

## 2017-06-27 DIAGNOSIS — N185 Chronic kidney disease, stage 5: Secondary | ICD-10-CM | POA: Diagnosis not present

## 2017-06-27 DIAGNOSIS — M545 Low back pain: Secondary | ICD-10-CM | POA: Diagnosis not present

## 2017-06-27 DIAGNOSIS — I129 Hypertensive chronic kidney disease with stage 1 through stage 4 chronic kidney disease, or unspecified chronic kidney disease: Secondary | ICD-10-CM | POA: Diagnosis present

## 2017-06-27 DIAGNOSIS — F419 Anxiety disorder, unspecified: Secondary | ICD-10-CM | POA: Diagnosis not present

## 2017-06-27 DIAGNOSIS — Z862 Personal history of diseases of the blood and blood-forming organs and certain disorders involving the immune mechanism: Secondary | ICD-10-CM | POA: Diagnosis not present

## 2017-06-27 DIAGNOSIS — M3215 Tubulo-interstitial nephropathy in systemic lupus erythematosus: Secondary | ICD-10-CM | POA: Diagnosis not present

## 2017-06-27 HISTORY — DX: Unspecified nephritic syndrome with focal and segmental glomerular lesions: N05.1

## 2017-06-27 HISTORY — DX: Secondary hyperparathyroidism of renal origin: N25.81

## 2017-06-27 HISTORY — DX: Personal history of other diseases of urinary system: Z87.448

## 2017-06-27 LAB — BASIC METABOLIC PANEL
ANION GAP: 10 (ref 5–15)
BUN: 81 mg/dL — ABNORMAL HIGH (ref 6–20)
CALCIUM: 10.8 mg/dL — AB (ref 8.9–10.3)
CO2: 22 mmol/L (ref 22–32)
Chloride: 103 mmol/L (ref 101–111)
Creatinine, Ser: 5.49 mg/dL — ABNORMAL HIGH (ref 0.44–1.00)
GFR, EST AFRICAN AMERICAN: 9 mL/min — AB (ref >60)
GFR, EST NON AFRICAN AMERICAN: 8 mL/min — AB (ref >60)
Glucose, Bld: 111 mg/dL — ABNORMAL HIGH (ref 65–99)
Potassium: 5.4 mmol/L — ABNORMAL HIGH (ref 3.5–5.1)
Sodium: 135 mmol/L (ref 135–145)

## 2017-06-27 LAB — URINALYSIS, ROUTINE W REFLEX MICROSCOPIC
BILIRUBIN URINE: NEGATIVE
Bacteria, UA: NONE SEEN
GLUCOSE, UA: NEGATIVE mg/dL
HGB URINE DIPSTICK: NEGATIVE
Ketones, ur: NEGATIVE mg/dL
Nitrite: NEGATIVE
PH: 5 (ref 5.0–8.0)
Protein, ur: 30 mg/dL — AB
Specific Gravity, Urine: 1.012 (ref 1.005–1.030)

## 2017-06-27 LAB — CBC
HCT: 29.3 % — ABNORMAL LOW (ref 36.0–46.0)
HEMOGLOBIN: 9.3 g/dL — AB (ref 12.0–15.0)
MCH: 28 pg (ref 26.0–34.0)
MCHC: 31.7 g/dL (ref 30.0–36.0)
MCV: 88.3 fL (ref 78.0–100.0)
Platelets: 195 10*3/uL (ref 150–400)
RBC: 3.32 MIL/uL — AB (ref 3.87–5.11)
RDW: 14.2 % (ref 11.5–15.5)
WBC: 4.3 10*3/uL (ref 4.0–10.5)

## 2017-06-27 LAB — I-STAT TROPONIN, ED: Troponin i, poc: 0.02 ng/mL (ref 0.00–0.08)

## 2017-06-27 LAB — BRAIN NATRIURETIC PEPTIDE: B Natriuretic Peptide: 338 pg/mL — ABNORMAL HIGH (ref 0.0–100.0)

## 2017-06-27 MED ORDER — DOCUSATE SODIUM 283 MG RE ENEM
1.0000 | ENEMA | RECTAL | Status: DC | PRN
  Filled 2017-06-27: qty 1

## 2017-06-27 MED ORDER — SORBITOL 70 % SOLN: 30.0000 mL | Status: DC | PRN

## 2017-06-27 MED ORDER — SODIUM CHLORIDE 0.9 % IV SOLN: INTRAVENOUS | Status: DC

## 2017-06-27 MED ORDER — ONDANSETRON HCL 4 MG PO TABS: 4.0000 mg | ORAL_TABLET | Freq: Four times a day (QID) | ORAL | Status: DC | PRN

## 2017-06-27 MED ORDER — SODIUM CHLORIDE 0.9% FLUSH
3.0000 mL | Freq: Two times a day (BID) | INTRAVENOUS | Status: DC
  Administered 2017-06-27 – 2017-07-05 (×11): 3 mL via INTRAVENOUS

## 2017-06-27 MED ORDER — CAMPHOR-MENTHOL 0.5-0.5 % EX LOTN
1.0000 "application " | TOPICAL_LOTION | Freq: Three times a day (TID) | CUTANEOUS | Status: DC | PRN
  Filled 2017-06-27: qty 222

## 2017-06-27 MED ORDER — DARBEPOETIN ALFA 100 MCG/0.5ML IJ SOSY
100.0000 ug | PREFILLED_SYRINGE | Freq: Once | INTRAMUSCULAR | Status: AC
  Administered 2017-06-28: 100 ug via SUBCUTANEOUS
  Filled 2017-06-27 (×2): qty 0.5

## 2017-06-27 MED ORDER — CALCIUM CARBONATE ANTACID 1250 MG/5ML PO SUSP
500.0000 mg | Freq: Four times a day (QID) | ORAL | Status: DC | PRN
  Filled 2017-06-27: qty 5

## 2017-06-27 MED ORDER — ACETAMINOPHEN 325 MG PO TABS
650.0000 mg | ORAL_TABLET | Freq: Four times a day (QID) | ORAL | Status: DC | PRN
  Administered 2017-07-01: 650 mg via ORAL
  Filled 2017-06-27: qty 2

## 2017-06-27 MED ORDER — NEPRO/CARBSTEADY PO LIQD
237.0000 mL | Freq: Three times a day (TID) | ORAL | Status: DC | PRN
  Filled 2017-06-27: qty 237

## 2017-06-27 MED ORDER — HYDROXYCHLOROQUINE SULFATE 200 MG PO TABS
200.0000 mg | ORAL_TABLET | Freq: Two times a day (BID) | ORAL | Status: DC
  Administered 2017-06-28 – 2017-07-05 (×17): 200 mg via ORAL
  Filled 2017-06-27 (×18): qty 1

## 2017-06-27 MED ORDER — ONDANSETRON HCL 4 MG/2ML IJ SOLN
4.0000 mg | Freq: Four times a day (QID) | INTRAMUSCULAR | Status: DC | PRN
  Administered 2017-07-01 – 2017-07-04 (×2): 4 mg via INTRAVENOUS
  Filled 2017-06-27: qty 2

## 2017-06-27 MED ORDER — HYDROXYZINE HCL 25 MG PO TABS: 25.0000 mg | ORAL_TABLET | Freq: Three times a day (TID) | ORAL | Status: DC | PRN

## 2017-06-27 MED ORDER — SODIUM CHLORIDE 0.9 % IV SOLN
INTRAVENOUS | Status: AC
  Administered 2017-06-27: via INTRAVENOUS

## 2017-06-27 MED ORDER — ATENOLOL 50 MG PO TABS
50.0000 mg | ORAL_TABLET | Freq: Every day | ORAL | Status: DC
  Administered 2017-06-28 – 2017-06-30 (×3): 50 mg via ORAL
  Filled 2017-06-27 (×3): qty 1

## 2017-06-27 MED ORDER — SODIUM CHLORIDE 0.9 % IV SOLN
250.0000 mL | INTRAVENOUS | Status: DC | PRN
  Administered 2017-07-04: 12:00:00 via INTRAVENOUS

## 2017-06-27 MED ORDER — ZOLPIDEM TARTRATE 5 MG PO TABS: 5.0000 mg | ORAL_TABLET | Freq: Every evening | ORAL | Status: DC | PRN

## 2017-06-27 MED ORDER — SODIUM CHLORIDE 0.9% FLUSH: 3.0000 mL | INTRAVENOUS | Status: DC | PRN

## 2017-06-27 MED ORDER — AMLODIPINE BESYLATE 5 MG PO TABS
5.0000 mg | ORAL_TABLET | Freq: Every day | ORAL | Status: DC
  Administered 2017-06-28: 5 mg via ORAL
  Filled 2017-06-27: qty 1

## 2017-06-27 MED ORDER — ACETAMINOPHEN 650 MG RE SUPP: 650.0000 mg | Freq: Four times a day (QID) | RECTAL | Status: DC | PRN

## 2017-06-27 MED ORDER — HEPARIN SODIUM (PORCINE) 5000 UNIT/ML IJ SOLN: 5000.0000 [IU] | Freq: Three times a day (TID) | INTRAMUSCULAR | Status: DC

## 2017-06-27 NOTE — Progress Notes (Signed)
Received report from [employer]. Room ready for patient. Heraclio Seidman, Wonda Cheng, Therapist, sports

## 2017-06-27 NOTE — ED Notes (Signed)
Paged Dr. Lorrene Reid to request clarification on where IV access can go.  States ok to start IV in pt's right hand.

## 2017-06-27 NOTE — ED Provider Notes (Signed)
[facility] EMERGENCY DEPARTMENT Provider Note   CSN: [id number] Arrival date & time: 06/27/17  1521     History   Chief Complaint Chief Complaint  Patient presents with  . Abnormal Lab    HPI Jenna [name] is a 62 y.o. female.  With a past medical history of lupus nephritis, FSGS who presents the emergency department with exertional dyspnea and hyperkalemia.  Patient was sent into the emergency department by Dr. Erling Cruz.  She complains of more than 1 month of worsening fatigue and exertional dyspnea.  She thought it was due to borderline anemia.  She has had some loose stools that were black.  She has had some orthostatic dizziness that is on and off.  Patient states that even going to the bathroom causes her to be completely exhausted and she has to lay down and catch her breath.  She denies any significant lower extremity edema, chest pain, abdominal pain. HPI  Past Medical History:  Diagnosis Date  . CKD (chronic kidney disease) stage 5, GFR less than 15 ml/min ([name]) 02/2017   Followed by Dr. Florene Glen  . FSGS (focal segmental glomerulosclerosis) 2011   By renal biopsy  . Hx of lupus nephritis 2011   by renal biopsy  . Lupus (systemic lupus erythematosus) ([name])    followed by Dr. Amil Amen  . Obesity   . Renal stone   . Secondary hyperparathyroidism of renal origin [facility])     Patient Active Problem List   Diagnosis Date Noted  . CKD (chronic kidney disease) stage 5, GFR less than 15 ml/min ([name]) 02/15/2017  . Back pain at L4-L5 level 10/31/2014  . Muscle spasm of back 10/31/2014  . Breast cancer screening 09/30/2014  . Visit for screening mammogram 09/30/2014  . Chronic maxillary sinusitis 07/04/2014  . Occipital headache 07/04/2014  . Increased urinary frequency 05/23/2014  . Falls 05/23/2014  . Prediabetes 05/23/2014  . Rash and nonspecific skin eruption 05/23/2014  . Essential hypertension 04/16/2014  . Encounter for immunization 11/19/2013    . Lupus (systemic lupus erythematosus) ([place]) 09/24/2013  . CKD (chronic kidney disease) stage 4, GFR 15-29 ml/min ([name]) 07/02/2013  . TIA (transient ischemic attack) 05/05/2013  . Numbness and tingling of left side of face 05/04/2013  . Lupus ([place]) 04/15/2011  . 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  . 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    History reviewed. No pertinent surgical history.   OB History   None      Home Medications    Prior to Admission medications   Medication Sig Start Date End Date Taking? Authorizing Provider  acetaminophen (TYLENOL) 500 MG tablet Take 1,000 mg by mouth every 6 (six) hours as needed for headache.    [provider]  amLODipine (NORVASC) 10 MG tablet Take 1 tablet (10 mg total) by mouth daily. 12/19/14   Tresa Garter, MD  aspirin 81 MG tablet Take 1 tablet (81 mg total) by mouth daily. 12/25/13   Tresa Garter, MD  atenolol (TENORMIN) 100 MG tablet Take 100 mg by mouth daily.    [provider]  benzonatate (TESSALON) 100 MG capsule Take 1 capsule (100 mg total) by mouth every 8 (eight) hours. 02/09/17   Hedges, Dellis Filbert, PA-C  calcitRIOL (ROCALTROL) 0.25 MCG capsule Take 0.25 mcg by mouth daily.    [provider]  calcium carbonate (TUMS - DOSED IN MG ELEMENTAL CALCIUM) 500 MG chewable tablet Chew 1 tablet by mouth daily as needed for  indigestion or heartburn.    [provider]  cholecalciferol (VITAMIN D) 1000 UNITS tablet Take 2,000 Units by mouth daily.    [provider]  fluticasone (FLONASE) 50 MCG/ACT nasal spray Place 1 spray into both nostrils daily. 02/09/17   Hedges, Dellis Filbert, PA-C  furosemide (LASIX) 40 MG tablet Take 40 mg by mouth daily.    [provider]  HYDROcodone-acetaminophen (NORCO) 7.5-325 MG per tablet Take 1 tablet by mouth every 6 (six) hours as needed for moderate pain or severe pain.  05/31/14   [provider]  hydrocortisone cream  0.5 % Apply 1 application topically 2 (two) times daily. 12/19/14   Tresa Garter, MD  hydroxychloroquine (PLAQUENIL) 200 MG tablet Take 1 tablet (200 mg total) by mouth 2 (two) times daily. 12/25/13   Tresa Garter, MD  losartan (COZAAR) 100 MG tablet Take 1 tablet (100 mg total) by mouth daily. 01/19/16   Tresa Garter, MD  Multiple Vitamins-Minerals (MULTIVITAMIN & MINERAL PO) Take 1 tablet by mouth daily.    [provider]    Family History Family History  Problem Relation Age of Onset  . Diabetes Mother   . Hypertension Father   . Cancer Sister   . Hypertension Brother     Social History Social History   Tobacco Use  . Smoking status: Never Smoker  . Smokeless tobacco: Never Used  Substance Use Topics  . Alcohol use: No  . Drug use: No     Allergies   Enalapril maleate; Chocolate; and Penicillins   Review of Systems Review of Systems Ten systems reviewed and are negative for acute change, except as noted in the HPI.    Physical Exam Updated Vital Signs There were no vitals taken for this visit.  Physical Exam  Constitutional: She is oriented to person, place, and time. She appears well-developed and well-nourished. No distress.  HENT:  Head: Normocephalic and atraumatic.  Eyes: Conjunctivae are normal. No scleral icterus.  Neck: Normal range of motion. No JVD present.  Cardiovascular: Normal rate, regular rhythm and normal heart sounds. Exam reveals no gallop and no friction rub.  No murmur heard. Pulmonary/Chest: Effort normal and breath sounds normal. No respiratory distress.  Abdominal: Soft. Bowel sounds are normal. She exhibits no distension and no mass. There is no tenderness. There is no guarding.  Neurological: She is alert and oriented to person, place, and time. Abnormal muscle tone:   Skin: Skin is warm and dry. Capillary refill takes less than 2 seconds. She is not diaphoretic.  Psychiatric: Her behavior is normal.    Nursing note and vitals reviewed.    ED Treatments / Results  Labs (all labs ordered are listed, but only abnormal results are displayed) Labs Reviewed  BASIC METABOLIC PANEL - Abnormal; Notable for the following components:      Result Value   Potassium 5.4 (*)    Glucose, Bld 111 (*)    BUN 81 (*)    Creatinine, Ser 5.49 (*)    Calcium 10.8 (*)    GFR calc non Af Amer 8 (*)    GFR calc Af Amer 9 (*)    All other components within normal limits  CBC - Abnormal; Notable for the following components:   RBC 3.32 (*)    Hemoglobin 9.3 (*)    HCT 29.3 (*)    All other components within normal limits  URINALYSIS, ROUTINE W REFLEX MICROSCOPIC - Abnormal; Notable for the following components:  Protein, ur 30 (*)    Leukocytes, UA TRACE (*)    All other components within normal limits  BRAIN NATRIURETIC PEPTIDE - Abnormal; Notable for the following components:   B Natriuretic Peptide 338.0 (*)    All other components within normal limits  CBG MONITORING, ED  I-STAT TROPONIN, ED    EKG None  ED ECG REPORT   Date: 06/27/2017  Rate: 46  Rhythm: normal sinus rhythm  QRS Axis: left  Intervals: normal  ST/T Wave abnormalities: nonspecific ST changes  Conduction Disutrbances:none  Narrative Interpretation:   Old EKG Reviewed: changes noted  I have personally reviewed the EKG tracing and agree with the computerized printout as noted.   Radiology Dg Chest 2 View  Result Date: 06/27/2017 CLINICAL DATA:  Exertional dyspnea. Orthostatic hypotension. Dizziness and fatigue. EXAM: CHEST - 2 VIEW COMPARISON:  06/23/2017 and 06/03/2014 FINDINGS: There is mild cardiomegaly. Pulmonary vascularity is normal and the lungs are clear except for a small area of linear scarring at the right lung base, unchanged since 2016. No effusions. Bones are normal. IMPRESSION: Stable mild cardiomegaly.  No acute abnormality. Electronically Signed   By: Lorriane Shire M.D.   On: 06/27/2017 16:51     Procedures Procedures (including critical care time)  Medications Ordered in ED Medications - No data to display   Initial Impression / Assessment and Plan / ED Course  I have reviewed the triage vital signs and the nursing notes.  Pertinent labs & imaging results that were available during my care of the patient were reviewed by me and considered in my medical decision making (see chart for details).     Patient here in the emergency department I have reviewed her lab results.  She has elevated BUN and creatinine.  She was seen here in the emergency department by Dr. Jamal Maes who recommends admission to begin dialysis for end-stage renal disease.  No evidence of pulmonary edema.  Final Clinical Impressions(s) / ED Diagnoses   Final diagnoses:  ESRD needing dialysis [facility])    ED Discharge Orders    None       Margarita Mail, PA-C 06/27/17 Sebastian Ache, MD 06/27/17 2351

## 2017-06-27 NOTE — ED Provider Notes (Signed)
MSE was initiated and I personally evaluated the patient and placed orders (if any) at  3:52 PM on Jun 27, 2017.  The patient appears stable so that the remainder of the MSE may be completed by another provider.  Patient placed in Quick Look pathway, seen and evaluated   Chief Complaint: Exertional dyspnea, abnormal lab  HPI:   She is sent to the emergency department by Dr. Wandra Feinstein her nephrologist for mild hyper kalemia area patient recently diagnosed with sinus bradycardia.  She has had about 1 month of severe exertional dyspnea which she has attributed to some borderline anemia.  She notes that she has intermittent loose stools and it did see a black stool a few days ago.  Patient states that she feels extremely fatigued and tired.  She states that getting up and going to the bathroom is exhausting and when she gets back to her bed she has to lie down to catch her breath.  This is new for the past month.  She the feels weak and dizzy with standing.  ROS: Exertional dyspnea (one)  Physical Exam:   Gen: No distress  Neuro: Awake and Alert  Skin: Warm    Focused Exam: Lungs clear to auscultation bilaterally, normal rate and rhythm of the heart   Initiation of care has begun. The patient has been counseled on the process, plan, and necessity for staying for the completion/evaluation, and the remainder of the medical screening examination    Margarita Mail, PA-C 06/27/17 Lena, Nathan, MD 06/27/17 2351

## 2017-06-27 NOTE — Consult Note (Signed)
[facility] Consultation Note Requesting Physician:  EDP Primary Nephrologist: Florene Glen Reason for Consult:  Progression of CKD with uremic symptoims  HPI: The patient is a 62 y.o. year-old AAF with SLE (Dr. Amil Amen), HTN, anemia and secondary HPT. 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 Renal function has been declining despite discontinuation of losartan.  She was seen at [facility] 05/17/17 with 4.1, up to 5.7 as of 06/23/17 with K of 5.8 (losartan stopped) and returned today for a followup in the office. She was significantly orthostatic (130/80 sitting to 80/60 standing), had lost 12 lb, so was sent to the ED for labs, some gentle hydration, and assessment of need to initiate dialysis. Labs in the ED showed creatinine of 5.4 GFR around 9.   She has ostensibly been at Stage 5 CKD since January of this year.  She has no permanent access in place. She is in the process of transplant referral.  Currently GFR is around 8-9. She reports anorexia, mild nausea without vomiting, severe fatigue (walking to the BR "wears her out"). Feet swell some if doesn't keep elevated. Has lost 14 lb in the past several weeks. Notable bradycardic on 100 mg atenolol. Daughter says  Some subtle issues with memory (pt normally extremely sharp, has Master's degree). + tinnitus (mostly when standing and likely related to BP dropping)  Creatinine Summary Date/Time Value  06/27/2017  5.49 (H)  06/23/2017  5.70 (H)  05/17/17  4.14 BUN 77 GFR 13  ([facility])  02/28/17  3.7 BUN 79 GFR 15  11/22/16 3.42  11/09/16 3.28  08/09/16 4.11 (C3C4 ESR nl - [facility])  12/24/15 2.71  07/30/15 2.64    Past Medical History:  Diagnosis Date  . CKD (chronic kidney disease) stage 5, GFR less than 15 ml/min ([name]) 02/2017   Followed by Dr. Florene Glen  . FSGS (focal segmental glomerulosclerosis) 2011   By renal biopsy  . Hx of lupus nephritis 2011    by renal biopsy  . Lupus (systemic lupus erythematosus) ([name])    followed by Dr. Amil Amen  . Obesity   . Renal stone   . Secondary hyperparathyroidism of renal origin [facility])     Past Surgical History: History reviewed. No pertinent surgical history.  Family History  Problem Relation Age of Onset  . Diabetes Mother   . Hypertension Father   . Cancer Sister   . Hypertension Brother    Social History:  reports that she has never smoked. She has never used smokeless tobacco. She reports that she does not drink alcohol or use drugs.  Allergies  Allergen Reactions  . Enalapril Maleate Other (See Comments)    Throat swelling  . Chocolate Nausea And Vomiting  . Penicillins Other (See Comments)    Light headed    Home medications: Prior to Admission medications   Medication Sig Start Date End Date Taking? Authorizing Provider  acetaminophen (TYLENOL) 500 MG tablet Take 1,000 mg by mouth every 6 (six) hours as needed for headache.    [provider]  amLODipine (NORVASC) 10 MG tablet Take 1 tablet (10 mg total) by mouth daily. 12/19/14   Tresa Garter, MD  aspirin 81 MG tablet Take 1 tablet (81 mg total) by mouth daily. 12/25/13   Tresa Garter, MD  atenolol (TENORMIN) 100 MG tablet Take 100 mg by mouth daily.    [provider]  benzonatate (TESSALON) 100 MG capsule Take 1 capsule (100 mg total) by mouth every 8 (eight) hours. 02/09/17   Hedges, Dellis Filbert, PA-C  calcitRIOL (ROCALTROL) 0.25 MCG capsule Take 0.25 mcg by mouth daily.    [provider]  calcium carbonate (TUMS - DOSED IN MG ELEMENTAL CALCIUM) 500 MG chewable tablet Chew 1 tablet by mouth daily as needed for indigestion or heartburn.    [provider]  cholecalciferol (VITAMIN D) 1000 UNITS tablet Take 2,000 Units by mouth daily.    [provider]  fluticasone (FLONASE) 50 MCG/ACT nasal spray Place 1 spray into both nostrils daily. 02/09/17   Hedges, Dellis Filbert, PA-C   furosemide (LASIX) 40 MG tablet Take 40 mg by mouth daily.    [provider]  HYDROcodone-acetaminophen (NORCO) 7.5-325 MG per tablet Take 1 tablet by mouth every 6 (six) hours as needed for moderate pain or severe pain.  05/31/14   [provider]  hydrocortisone cream 0.5 % Apply 1 application topically 2 (two) times daily. 12/19/14   Tresa Garter, MD  hydroxychloroquine (PLAQUENIL) 200 MG tablet Take 1 tablet (200 mg total) by mouth 2 (two) times daily. 12/25/13   Tresa Garter, MD  losartan (COZAAR) 100 MG tablet Take 1 tablet (100 mg total) by mouth daily. 01/19/16 NOT ON - HAS BEEN STOPPED  Jegede, Olugbemiga E, MD  Multiple Vitamins-Minerals (MULTIVITAMIN & MINERAL PO) Take 1 tablet by mouth daily.    [provider]    Inpatient medications: Not yet ordered  Review of Systems See HPI   Physical Exam:  There were no vitals taken for this visit.  Gen: Very nice fatigued appearing AAF Orthostatic (from our office earlier systolic 329 JMEQAST->41 standing) Other vitals pending Lines/tubes: none Skin: no rash, cyanosis Neck: no JVD, no bruits or LAN Chest: Clear without crackles or wheezes Heart: regular, no rub or gallop S1S2 No S3 Quite bradycardic Abdomen: soft, obese, not tender Ext: No pretibial edema, trace ankle edema.  Neuro: alert, Ox3, no focal deficit 1-2 beats asterixus Dialysis Access: None  Recent Labs  Lab 06/23/17 1600 06/27/17 1553  NA 132* 135  K 5.8* 5.4*  CL 101 103  CO2 21* 22  GLUCOSE 97 111*  BUN 88* 81*  CREATININE 5.70* 5.49*  CALCIUM 9.9 10.8*    Recent Labs  Lab 06/23/17 1600 06/27/17 1553  WBC 4.5 4.3  HGB 9.2* 9.3*  HCT 28.4* 29.3*  MCV 87.9 88.3  PLT 198 195    Xrays/Other Studies: Dg Chest 2 View  Result Date: 06/27/2017 CLINICAL DATA:  Exertional dyspnea. Orthostatic hypotension. Dizziness and fatigue. EXAM: CHEST - 2 VIEW COMPARISON:  06/23/2017 and 06/03/2014 FINDINGS: There is  mild cardiomegaly. Pulmonary vascularity is normal and the lungs are clear except for a small area of linear scarring at the right lung base, unchanged since 2016. No effusions. Bones are normal. IMPRESSION: Stable mild cardiomegaly.  No acute abnormality. Electronically Signed   By: Lorriane Shire M.D.   On: 06/27/2017 16:51    Background: 62 y.o. year-old AAF with SLE (Dr. Amil Amen), HTN, anemia and secondary HPT. 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 Renal function has been declining despite discontinuation of losartan.  She was seen at [facility] 05/17/17 with 4.1, up to 5.7 as of 06/23/17 with K of 5.8 (losartan stopped) and returned today for a followup in  the office. She was significantly orthostatic (130/80 sitting to 80/60 standing), had lost 12 lb, so was sent to the ED for labs, some gentle hydration, and assessment of need to initiate dialysis. Labs in the ED showed creatinine of 5.4 GFR around 9.   Assessment/Recommendations  1. 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 Progressive past year, Stage 5 pretty much since 02/2017. Clinically uremic. Needs to start dialysis, though not emergent tonight (actually looks pretty good, just feels awful). 1. Arrange for [facility], initiate HD once in 2. CLIP (I think will CLIP to [facility]) 3. Vmap for vascular access as below 2. Anemia - Fe studies ordered, start Aranesp (100 mcg ordered [place] for tonight) 3. Secondary HPT - hypercalcemic at 10.8. Stop outpt calcitriol and repeat PTH. Check phos 4. Vascular access - vein mapping for AVF. Arrange for [facility] to start HD.  5. SLE - continue plaquenil 6. Hypertension - with symptomatic orthostasis and significant bradycardia. Cut atenolol and amlodipine in half. (Losartan was stopped last week) See orders. Will give just 1 liter of fluid at 50/hour then stop. Monitor orthostatics  Dr.  Moshe Cipro will see tomorrow.  Jamal Maes,  MD [facility] [phone] pager 06/27/2017, 6:23 PM

## 2017-06-27 NOTE — H&P (Signed)
History and Physical    Jenna [name] DGU:[id number] DOB: 1955-11-12 DOA: 06/27/2017  Referring MD/NP/PA: Margarita Mail, NP  PCP: Rogers Blocker, MD   Outpatient Specialists: Dr. Florene Glen, nephrology  Patient coming from: home  Chief Complaint: exertional dyspnea  HPI: Jenna [name] is a 62 y.o. 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 Her primary nephrologist Dr. Florene Glen has been managing her renal function but over the last month she's had progressive shortness of breath with exertion. Associated with weakness. Patient has also noted some lower extremity edema. She's been fatigue with minimal exertion. She has borderline anemia from chronic disease. Patient noticed some loose stools recently some of which were black. Little activities at home including walking from the bathroom to the bedroom makes eye exhausted. She came to the ER where she was evaluated and found to have worsening renal function. She appears to have congestion secondary to end-stage renal disease. Nephrology has decided to initiate hemodialysis and patient is being admitted for workup.   ED Course: Patient has a blood pressure of 156/83 but pulse of 48. Roxicet is 100% on room air. White count is 4.3 with hemoglobin 9.3. Sodium 135 potassium 5.4 BUN 81 and creatinine 5.49. Her calcium is 10.9. Patient has been seen by nephrology and plan is to initiate hemodialysis  Review of Systems: As per HPI otherwise 10 point review of systems negative.    Past Medical History:  Diagnosis Date  . CKD (chronic kidney disease) stage 5, GFR less than 15 ml/min ([name]) 02/2017   Followed by Dr. Florene Glen  . FSGS (focal segmental glomerulosclerosis) 2011   By renal biopsy  . Hx of lupus nephritis 2011   by renal biopsy  . Lupus (systemic lupus erythematosus) ([name])     followed by Dr. Amil Amen  . Obesity   . Renal stone   . Secondary hyperparathyroidism of renal origin [facility])     History reviewed. No pertinent surgical history.   reports that she has never smoked. She has never used smokeless tobacco. She reports that she does not drink alcohol or use drugs.  Allergies  Allergen Reactions  . Enalapril Maleate Other (See Comments)    Throat swelling  . Chocolate Nausea And Vomiting  . Penicillins Other (See Comments)    Has patient had a PCN reaction causing immediate rash, facial/tongue/throat swelling, SOB or lightheadedness with hypotension: No Has patient had a PCN reaction causing severe rash involving mucus membranes or skin necrosis: No Has patient had a PCN reaction that required hospitalization: No Has patient had a PCN reaction occurring within the last 10 years: No If all of the above answers are "NO", then may proceed with Cephalosporin use.     Family History  Problem Relation Age of Onset  . Diabetes Mother   . Hypertension Father   . Cancer Sister   . Hypertension Brother      Prior to Admission medications   Medication Sig Start Date End Date Taking? Authorizing Provider  acetaminophen (TYLENOL) 500 MG tablet Take 1,000 mg by mouth every 6 (six) hours as needed for headache.   Yes [provider]  amLODipine (NORVASC) 10 MG tablet Take 1 tablet (10 mg total) by mouth daily. 12/19/14  Yes Tresa Garter, MD  aspirin 81 MG tablet Take 1 tablet (81 mg total) by mouth daily.  12/25/13  Yes Tresa Garter, MD  atenolol (TENORMIN) 100 MG tablet Take 100 mg by mouth daily.   Yes [provider]  calcitRIOL (ROCALTROL) 0.25 MCG capsule Take 0.25 mcg by mouth daily.   Yes [provider]  calcium carbonate (TUMS - DOSED IN MG ELEMENTAL CALCIUM) 500 MG chewable tablet Chew 1 tablet by mouth daily as needed for indigestion or heartburn.   Yes [provider]  cholecalciferol (VITAMIN D)  1000 UNITS tablet Take 2,000 Units by mouth daily.   Yes [provider]  furosemide (LASIX) 40 MG tablet Take 40 mg by mouth daily.   Yes [provider]  HYDROcodone-acetaminophen (NORCO) 7.5-325 MG per tablet Take 1 tablet by mouth every 6 (six) hours as needed for moderate pain or severe pain.  05/31/14  Yes [provider]  hydroxychloroquine (PLAQUENIL) 200 MG tablet Take 1 tablet (200 mg total) by mouth 2 (two) times daily. 12/25/13  Yes Tresa Garter, MD  Multiple Vitamins-Minerals (MULTIVITAMIN & MINERAL PO) Take 1 tablet by mouth daily.   Yes [provider]  benzonatate (TESSALON) 100 MG capsule Take 1 capsule (100 mg total) by mouth every 8 (eight) hours. Patient not taking: Reported on 06/27/2017 02/09/17   Hedges, Dellis Filbert, PA-C  fluticasone [facility]) 50 MCG/ACT nasal spray Place 1 spray into both nostrils daily. Patient not taking: Reported on 06/27/2017 02/09/17   Okey Regal, PA-C  hydrocortisone cream 0.5 % Apply 1 application topically 2 (two) times daily. Patient not taking: Reported on 06/27/2017 12/19/14   Tresa Garter, MD  losartan (COZAAR) 100 MG tablet Take 1 tablet (100 mg total) by mouth daily. Patient not taking: Reported on 06/27/2017 01/19/16   Tresa Garter, MD    Physical Exam: Vitals:   06/27/17 2029  BP: 136/77  Pulse: 86  Resp: 16  SpO2: 100%      Constitutional: NAD, calm, comfortable Vitals:   06/27/17 2029  BP: 136/77  Pulse: 86  Resp: 16  SpO2: 100%   Eyes: PERRL, lids and conjunctivae normal ENMT: Mucous membranes are moist. Posterior pharynx clear of any exudate or lesions.Normal dentition.  Neck: normal, supple, no masses, no thyromegaly Respiratory: clear to auscultation bilaterally, no wheezing, no crackles. Normal respiratory effort. No accessory muscle use.  Cardiovascular: Regular rate and rhythm, no murmurs / rubs / gallops. No extremity edema. 2+ pedal pulses. No carotid  bruits.  Abdomen: no tenderness, no masses palpated. No hepatosplenomegaly. Bowel sounds positive.  Musculoskeletal: no clubbing / cyanosis. No joint deformity upper and lower extremities. Good ROM, no contractures. Normal muscle tone.  Skin: no rashes, lesions, ulcers. No induration Neurologic: CN 2-12 grossly intact. Sensation intact, DTR normal. Strength 5/5 in all 4.  Psychiatric: Normal judgment and insight. Alert and oriented x 3. Normal mood.    Labs on Admission: I have personally reviewed following labs and imaging studies  CBC: Recent Labs  Lab 06/23/17 1600 06/27/17 1553  WBC 4.5 4.3  HGB 9.2* 9.3*  HCT 28.4* 29.3*  MCV 87.9 88.3  PLT 198 315   Basic Metabolic Panel: Recent Labs  Lab 06/23/17 1600 06/27/17 1553  NA 132* 135  K 5.8* 5.4*  CL 101 103  CO2 21* 22  GLUCOSE 97 111*  BUN 88* 81*  CREATININE 5.70* 5.49*  CALCIUM 9.9 10.8*   GFR: Estimated Creatinine Clearance: 13.2 mL/min (A) (by C-G formula based on SCr of 5.49 mg/dL (H)). Liver Function Tests: No results for input(s): AST, ALT, ALKPHOS,  BILITOT, PROT, ALBUMIN in the last 168 hours. No results for input(s): LIPASE, AMYLASE in the last 168 hours. No results for input(s): AMMONIA in the last 168 hours. Coagulation Profile: No results for input(s): INR, PROTIME in the last 168 hours. Cardiac Enzymes: No results for input(s): CKTOTAL, CKMB, CKMBINDEX, TROPONINI in the last 168 hours. BNP (last 3 results) No results for input(s): PROBNP in the last 8760 hours. HbA1C: No results for input(s): HGBA1C in the last 72 hours. CBG: No results for input(s): GLUCAP in the last 168 hours. Lipid Profile: No results for input(s): CHOL, HDL, LDLCALC, TRIG, CHOLHDL, LDLDIRECT in the last 72 hours. Thyroid Function Tests: No results for input(s): TSH, T4TOTAL, FREET4, T3FREE, THYROIDAB in the last 72 hours. Anemia Panel: No results for input(s): VITAMINB12, FOLATE, FERRITIN, TIBC, IRON, RETICCTPCT in the last  72 hours. Urine analysis:    Component Value Date/Time   COLORURINE YELLOW 06/27/2017 [place] 06/27/2017 1833   LABSPEC 1.012 06/27/2017 1833   PHURINE 5.0 06/27/2017 1833   GLUCOSEU NEGATIVE 06/27/2017 1833   HGBUR NEGATIVE 06/27/2017 1833   BILIRUBINUR NEGATIVE 06/27/2017 1833   BILIRUBINUR neg 12/19/2014 1157   KETONESUR NEGATIVE 06/27/2017 1833   PROTEINUR 30 (A) 06/27/2017 1833   UROBILINOGEN 0.2 12/19/2014 1157   UROBILINOGEN 0.2 05/23/2014 1301   NITRITE NEGATIVE 06/27/2017 1833   LEUKOCYTESUR TRACE (A) 06/27/2017 1833   Sepsis Labs: $[email or web address]$ (procalcitonin:4,lacticidven:4) )No results found for this or any previous visit (from the past 240 hour(s)).   Radiological Exams on Admission: Dg Chest 2 View  Result Date: 06/27/2017 CLINICAL DATA:  Exertional dyspnea. Orthostatic hypotension. Dizziness and fatigue. EXAM: CHEST - 2 VIEW COMPARISON:  06/23/2017 and 06/03/2014 FINDINGS: There is mild cardiomegaly. Pulmonary vascularity is normal and the lungs are clear except for a small area of linear scarring at the right lung base, unchanged since 2016. No effusions. Bones are normal. IMPRESSION: Stable mild cardiomegaly.  No acute abnormality. Electronically Signed   By: Lorriane Shire M.D.   On: 06/27/2017 16:51    EKG: Independently reviewed. Normal sinus rhythm with no peak T waves.  Assessment/Plan Principal Problem:   End stage renal disease ([name]) Active Problems:   Essential hypertension   Hx of lupus nephritis    #1 end-stage renal disease: Most likely the cause of her exertional dyspnea. Patient has been seen by nephrology. Plan is to admit and place access catheter tomorrow and initiate hemodialysis. We will defer to nephrology.  #2 nausea with vomiting: Most likely due to acute on chronic renal failure. Continue management.  #3 hypertension: Blood pressure is elevated. Patient will continue home management with hemodialysis  #4 history of  lupus nephritis: Cause of patient's renal failure. Continue home regimen.  #5 anemia of chronic disease: Her H&H appears to be stable at this point. Continue treatment.   DVT prophylaxis: heparin  Code Status: full  Family Communication: none at bedside  Disposition Plan: home after dialysis is set up  Consults called: Dr. Jamal Maes, Nephrology  Admission status: inpatient  Severity of Illness: The appropriate patient status for this patient is INPATIENT. Inpatient status is judged to be reasonable and necessary in order to provide the required intensity of service to ensure the patient's safety. The patient's presenting symptoms, physical exam findings, and initial radiographic and laboratory data in the context of their chronic comorbidities is felt to place them at high risk for further clinical deterioration. Furthermore, it is not anticipated that the patient will be medically  stable for discharge from the hospital within 2 midnights of admission. The following factors support the patient status of inpatient.   " The patient's presenting symptoms include nausea was vomiting. " The worrisome physical exam findings include fluid overload. " The initial radiographic and laboratory data are worrisome because of creatinine of 5.49. " The chronic co-morbidities inc lupus nephritis.   * I certify that at the point of admission it is my clinical judgment that the patient will require inpatient hospital care spanning beyond 2 midnights from the point of admission due to high intensity of service, high risk for further deterioration and high frequency of surveillance required.Barbette Merino MD [facility] Pager [phone]  If 7PM-7AM, please contact night-coverage [email or web address] Password [facility]  06/27/2017, 8:33 PM

## 2017-06-27 NOTE — ED Triage Notes (Signed)
Pt here from [facility] with complaint of orthostatic hypotension, dizziness, fatigue. Pt was here last week for bradycardia and found to have elevated potassium. Pt has continued to feel weak, labs at [facility] showed dehydration and she was sent over for further eval.

## 2017-06-28 ENCOUNTER — Inpatient Hospital Stay (HOSPITAL_COMMUNITY): Payer: Medicare Other

## 2017-06-28 ENCOUNTER — Encounter (HOSPITAL_COMMUNITY): Payer: Self-pay | Admitting: Interventional Radiology

## 2017-06-28 ENCOUNTER — Telehealth: Payer: Self-pay | Admitting: Family Medicine

## 2017-06-28 DIAGNOSIS — Z01810 Encounter for preprocedural cardiovascular examination: Secondary | ICD-10-CM

## 2017-06-28 HISTORY — PX: IR FLUORO GUIDE CV LINE RIGHT: IMG2283

## 2017-06-28 HISTORY — PX: IR US GUIDE VASC ACCESS RIGHT: IMG2390

## 2017-06-28 LAB — RENAL FUNCTION PANEL
ALBUMIN: 2.9 g/dL — AB (ref 3.5–5.0)
Anion gap: 12 (ref 5–15)
BUN: 83 mg/dL — AB (ref 6–20)
CALCIUM: 10 mg/dL (ref 8.9–10.3)
CO2: 21 mmol/L — AB (ref 22–32)
CREATININE: 5.65 mg/dL — AB (ref 0.44–1.00)
Chloride: 106 mmol/L (ref 101–111)
GFR calc Af Amer: 9 mL/min — ABNORMAL LOW (ref >60)
GFR calc non Af Amer: 7 mL/min — ABNORMAL LOW (ref >60)
Glucose, Bld: 88 mg/dL (ref 65–99)
PHOSPHORUS: 6.4 mg/dL — AB (ref 2.5–4.6)
Potassium: 4.8 mmol/L (ref 3.5–5.1)
SODIUM: 139 mmol/L (ref 135–145)

## 2017-06-28 LAB — CBC
HCT: 27 % — ABNORMAL LOW (ref 36.0–46.0)
Hemoglobin: 8.6 g/dL — ABNORMAL LOW (ref 12.0–15.0)
MCH: 27.7 pg (ref 26.0–34.0)
MCHC: 31.9 g/dL (ref 30.0–36.0)
MCV: 87.1 fL (ref 78.0–100.0)
PLATELETS: 172 10*3/uL (ref 150–400)
RBC: 3.1 MIL/uL — ABNORMAL LOW (ref 3.87–5.11)
RDW: 14.2 % (ref 11.5–15.5)
WBC: 4.7 10*3/uL (ref 4.0–10.5)

## 2017-06-28 LAB — HIV ANTIBODY (ROUTINE TESTING W REFLEX): HIV Screen 4th Generation wRfx: NONREACTIVE

## 2017-06-28 LAB — PROTIME-INR
INR: 1.09
PROTHROMBIN TIME: 14.1 s (ref 11.4–15.2)

## 2017-06-28 LAB — IRON AND TIBC
Iron: 48 ug/dL (ref 28–170)
Saturation Ratios: 27 % (ref 10.4–31.8)
TIBC: 175 ug/dL — ABNORMAL LOW (ref 250–450)
UIBC: 127 ug/dL

## 2017-06-28 MED ORDER — VANCOMYCIN HCL IN DEXTROSE 1-5 GM/200ML-% IV SOLN
1000.0000 mg | INTRAVENOUS | Status: AC
Start: 2017-06-28 — End: 2017-06-28
  Administered 2017-06-28: 1000 mg via INTRAVENOUS
  Filled 2017-06-28: qty 200

## 2017-06-28 MED ORDER — FENTANYL CITRATE (PF) 100 MCG/2ML IJ SOLN
INTRAMUSCULAR | Status: AC | PRN
  Administered 2017-06-28: 50 ug via INTRAVENOUS
  Administered 2017-06-28: 25 ug via INTRAVENOUS

## 2017-06-28 MED ORDER — MIDAZOLAM HCL 2 MG/2ML IJ SOLN
INTRAMUSCULAR | Status: AC
  Filled 2017-06-28: qty 4

## 2017-06-28 MED ORDER — HEPARIN SODIUM (PORCINE) 5000 UNIT/ML IJ SOLN
5000.0000 [IU] | Freq: Three times a day (TID) | INTRAMUSCULAR | Status: DC
  Administered 2017-06-29 – 2017-07-03 (×2): 5000 [IU] via SUBCUTANEOUS
  Filled 2017-06-28 (×8): qty 1

## 2017-06-28 MED ORDER — GELATIN ABSORBABLE 12-7 MM EX MISC
CUTANEOUS | Status: AC
  Filled 2017-06-28: qty 1

## 2017-06-28 MED ORDER — FENTANYL CITRATE (PF) 100 MCG/2ML IJ SOLN
INTRAMUSCULAR | Status: AC
  Filled 2017-06-28: qty 4

## 2017-06-28 MED ORDER — LIDOCAINE HCL 1 % IJ SOLN
INTRAMUSCULAR | Status: AC
  Filled 2017-06-28: qty 20

## 2017-06-28 MED ORDER — NEPRO/CARBSTEADY PO LIQD
237.0000 mL | Freq: Two times a day (BID) | ORAL | Status: DC
  Administered 2017-06-29 – 2017-07-05 (×9): 237 mL via ORAL
  Filled 2017-06-28 (×17): qty 237

## 2017-06-28 MED ORDER — HYDROCODONE-ACETAMINOPHEN 7.5-325 MG PO TABS
1.0000 | ORAL_TABLET | Freq: Four times a day (QID) | ORAL | Status: DC | PRN
  Administered 2017-06-28 – 2017-07-05 (×7): 1 via ORAL
  Filled 2017-06-28 (×9): qty 1

## 2017-06-28 MED ORDER — KIDNEY FAILURE BOOK
Freq: Once | Status: AC
  Administered 2017-06-28: 08:00:00
  Filled 2017-06-28: qty 1

## 2017-06-28 MED ORDER — HEPARIN SODIUM (PORCINE) 1000 UNIT/ML IJ SOLN
INTRAMUSCULAR | Status: AC
  Filled 2017-06-28: qty 1

## 2017-06-28 MED ORDER — MIDAZOLAM HCL 2 MG/2ML IJ SOLN
INTRAMUSCULAR | Status: AC | PRN
  Administered 2017-06-28: 0.5 mg via INTRAVENOUS
  Administered 2017-06-28: 1 mg via INTRAVENOUS

## 2017-06-28 MED ORDER — SODIUM CHLORIDE 0.9 % IV SOLN
125.0000 mg | Freq: Every day | INTRAVENOUS | Status: DC
  Filled 2017-06-28 (×2): qty 10

## 2017-06-28 MED ORDER — VANCOMYCIN HCL IN DEXTROSE 1-5 GM/200ML-% IV SOLN
INTRAVENOUS | Status: AC
  Administered 2017-06-28: 1000 mg via INTRAVENOUS
  Filled 2017-06-28: qty 200

## 2017-06-28 NOTE — Telephone Encounter (Signed)
Copied from [place] [id number]. Topic: Inquiry >> Jun 28, 2017 11:29 AM Oliver Pila B wrote: Reason for CRM: pt called b/c she is in currently in the hospital, pt states that she may have to miss the Friday appt b/c she may still be admitted, but the pt does not want to miss the new pt appt, pt states she is available for e-visit for if the pcp/nurse could call them to be updated and to have a way to be fit back in the schedule due to pt being in the hospital contact pt to advise

## 2017-06-28 NOTE — Progress Notes (Signed)
$'@IPLOG'd$ @        PROGRESS NOTE                                                                                                                                                                                                             Patient Demographics:    Jenna [name], is a 62 y.o. female, DOB - 27-Oct-1955, QVZ:[id number]  Admit date - 06/27/2017   Admitting Physician Elwyn Reach, MD  Outpatient Primary MD for the patient is Rogers Blocker, MD  LOS - 1  Chief Complaint  Patient presents with  . Abnormal Lab       Brief Narrative  Jenna [name] is a 62 y.o. 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 Her primary nephrologist Dr. Florene Glen has been managing her renal function but over the last month she's had progressive shortness of breath with exertion. Associated with weakness. Patient has also noted some lower extremity edema, she was seen by the nephrologist in the office and sent here to prepare for dialysis commencement.    Subjective:    Jenna [name] today has, No headache, No chest pain, No abdominal pain - No Nausea, No new weakness tingling or numbness, No Cough - Mild SOB.     Assessment  & Plan :     1.  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  Seen by nephrology, being prepared for HD likely will start tomorrow.  Continue supportive care.  In no distress.  2.  SLE.  Stable on Plaquenil continue.  3.  Hypertension.  Continue home dose beta-blocker on Norvasc, ARB on hold currently.  4.  Anemia of chronic disease.  Stable no acute issues.    Diet : Renal   Family Communication  :  Daughter  Code Status :  Full  Disposition Plan  :  TBD  Consults  :  Renal  Procedures  :      DVT Prophylaxis  :    Heparin    Lab Results  Component Value Date   PLT 172 06/28/2017    Inpatient Medications  Scheduled Meds: . amLODipine  5 mg Oral QHS  . atenolol  50 mg Oral QHS  . darbepoetin (ARANESP) injection - NON-DIALYSIS  100 mcg Subcutaneous Once  . feeding supplement (  NEPRO CARB STEADY)  237 mL Oral BID BM  . [START ON 06/29/2017] heparin  5,000 Units Subcutaneous Q8H  . hydroxychloroquine  200 mg Oral BID  . sodium chloride flush  3 mL Intravenous Q12H   Continuous Infusions: . sodium chloride 50 mL/hr at 06/27/17 2334  . sodium chloride    . ferric gluconate (FERRLECIT/NULECIT) IV    . vancomycin     PRN Meds:.sodium chloride, acetaminophen **OR** acetaminophen, calcium carbonate (dosed in mg elemental calcium), camphor-menthol **AND** hydrOXYzine, docusate sodium, ondansetron **OR** ondansetron (ZOFRAN) IV, sodium chloride flush, sorbitol, zolpidem  Antibiotics  :    Anti-infectives (From admission, onward)   Start     Dose/Rate Route Frequency Ordered Stop   06/28/17 0800  vancomycin (VANCOCIN) IVPB 1000 mg/200 mL premix     1,000 mg 200 mL/hr over 60 Minutes Intravenous To Radiology 06/28/17 0757 06/29/17 0800   06/27/17 2200  hydroxychloroquine (PLAQUENIL) tablet 200 mg     200 mg Oral 2 times daily 06/27/17 2014           Objective:   Vitals:   06/27/17 2325 06/27/17 2352 06/28/17 0542 06/28/17 0856  BP: 93/60  110/64 139/61  Pulse: (!) 115  62 61  Resp: $Remo'16  16 18  'kgfSa$ Temp:   98.9 F (37.2 C) 98.7 F (37.1 C)  TempSrc:   Oral Oral  SpO2: 90%  100% 98%  Weight:      Height:  5' 7.5" (1.715 m)      Wt Readings from Last 3 Encounters:  06/27/17 99.8 kg (220 lb)  06/23/17 102.1 kg (225 lb)  05/18/17 99.8 kg (220 lb)     Intake/Output Summary (Last 24 hours) at 06/28/2017 1232 Last data filed at 06/28/2017 0900 Gross per 24 hour  Intake 561.67 ml  Output 1500 ml  Net -938.33 ml     Physical Exam  Awake Alert, Oriented X 3, No new F.N deficits, Normal  affect [place].AT,PERRAL Supple Neck,No JVD, No cervical lymphadenopathy appriciated.  Symmetrical Chest wall movement, Good air movement bilaterally, few crackles RRR,No Gallops,Rubs or new Murmurs, No Parasternal Heave +ve B.Sounds, Abd Soft, No tenderness, No organomegaly appriciated, No rebound - guarding or rigidity. No Cyanosis, Clubbing, trace edema, No new Rash or bruise     Data Review:    CBC Recent Labs  Lab 06/23/17 1600 06/27/17 1553 06/28/17 0629  WBC 4.5 4.3 4.7  HGB 9.2* 9.3* 8.6*  HCT 28.4* 29.3* 27.0*  PLT 198 195 172  MCV 87.9 88.3 87.1  MCH 28.5 28.0 27.7  MCHC 32.4 31.7 31.9  RDW 14.3 14.2 14.2    Chemistries  Recent Labs  Lab 06/23/17 1600 06/27/17 1553 06/28/17 0629  NA 132* 135 139  K 5.8* 5.4* 4.8  CL 101 103 106  CO2 21* 22 21*  GLUCOSE 97 111* 88  BUN 88* 81* 83*  CREATININE 5.70* 5.49* 5.65*  CALCIUM 9.9 10.8* 10.0   ------------------------------------------------------------------------------------------------------------------ No results for input(s): CHOL, HDL, LDLCALC, TRIG, CHOLHDL, LDLDIRECT in the last 72 hours.  Lab Results  Component Value Date   HGBA1C 5.30 10/31/2014   ------------------------------------------------------------------------------------------------------------------ No results for input(s): TSH, T4TOTAL, T3FREE, THYROIDAB in the last 72 hours.  Invalid input(s): FREET3 ------------------------------------------------------------------------------------------------------------------ Recent Labs    06/28/17 0629  TIBC 175*  IRON 48    Coagulation profile Recent Labs  Lab 06/28/17 0629  INR 1.09    No results for input(s): DDIMER in the last 72 hours.  Cardiac Enzymes No results for  input(s): CKMB, TROPONINI, MYOGLOBIN in the last 168 hours.  Invalid input(s): CK ------------------------------------------------------------------------------------------------------------------    Component  Value Date/Time   BNP 338.0 (H) 06/27/2017 1552    Micro Results No results found for this or any previous visit (from the past 240 hour(s)).  Radiology Reports Dg Chest 2 View  Result Date: 06/27/2017 CLINICAL DATA:  Exertional dyspnea. Orthostatic hypotension. Dizziness and fatigue. EXAM: CHEST - 2 VIEW COMPARISON:  06/23/2017 and 06/03/2014 FINDINGS: There is mild cardiomegaly. Pulmonary vascularity is normal and the lungs are clear except for a small area of linear scarring at the right lung base, unchanged since 2016. No effusions. Bones are normal. IMPRESSION: Stable mild cardiomegaly.  No acute abnormality. Electronically Signed   By: Lorriane Shire M.D.   On: 06/27/2017 16:51   Dg Chest 2 View  Result Date: 06/23/2017 CLINICAL DATA:  Per EMS, pt was at rehab for falls when she had a new onset of bradycardia in the 40s. Left bundle branch block, sinus brady per EMS EKG. Pt reported intermittent left arm pain. EXAM: CHEST - 2 VIEW COMPARISON:  02/09/2017 FINDINGS: The cardiopericardial silhouette is enlarged and increased in prominence compared to prior studies but also accentuated by the AP position. There is no pulmonary edema. There are no focal consolidations or pleural effusions. Visualized osseous structures have a normal appearance. IMPRESSION: Enlarged cardiopericardial silhouette without edema. Findings raise the question of pericardial effusion. Electronically Signed   By: Nolon Nations M.D.   On: 06/23/2017 17:09    Time Spent in minutes  30   Lala Lund M.D on 06/28/2017 at 12:32 PM  Between 7am to 7pm - Pager - [phone] ( page via [email or web address], text pages only, please mention full 10 digit call back number). After 7pm go to [email or web address] - password [email or web address]

## 2017-06-28 NOTE — Telephone Encounter (Signed)
Spoke with patient and she informed me that they told her that she would probably be in the hospital for 4 days. Informed her to cancel appointment for this week and to call and reschedule whenever she was released from the hospital and could come for an appointment. Patient verbalized understanding.

## 2017-06-28 NOTE — Progress Notes (Signed)
Upper Extremity Vein Map  Right Cephalic  Segment Diameter Depth Comment  1. Axilla 4.30mm 13.34mm   2. Mid upper arm 2.62mm 9.40mm   3. Above AC 2.59mm 6.28mm   4. In [facility] 7.57mm 2.42mm   5. Below AC 3.58mm 6.72mm   6. Mid forearm 2.47mm 6.35mm   7. Wrist 3.41mm 8.82mm                   Right Basilic- not visualized   Left Cephalic  Segment Diameter Depth Comment  1. Axilla 4.48mm 10.69mm   2. Mid upper arm 5.25mm 5.42mm   3. Above AC 5.59mm 5.66mm   4. In [facility] 8.19mm 2.11mm   5. Below AC 4.41mm 5.16mm Branching  6. Mid forearm 3.85mm 5.70mm   7. Wrist 2.82mm 4.80mm                   Left Basilic- not visualized   06/28/2017 1:15 PM Jenna [name], BS, RVT, RDCS, RDMS

## 2017-06-28 NOTE — Progress Notes (Addendum)
Initial Nutrition Assessment  DOCUMENTATION CODES:   Obesity unspecified  INTERVENTION:   - Nepro Shake po BID, each supplement provides 425 kcal and 19 grams protein  NUTRITION DIAGNOSIS:   Inadequate oral intake related to poor appetite as evidenced by per patient/family report.  GOAL:   Patient will meet greater than or equal to 90% of their needs  MONITOR:   PO intake, Supplement acceptance, Diet advancement, Labs, Weight trends, I & O's  REASON FOR ASSESSMENT:   Malnutrition Screening Tool    ASSESSMENT:   62 year old female who presented to ED with orthostatic hypotension, dizziness, and fatigue. PMH significant for AAF with SLE, hypertension, and secondary HPT. 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  06/28/17 - plan for tunneled dialysis catheter placement and initiation of iHD today or tomorrow  Spoke with pt and daughter at bedside. Pt reports fatigue and having a poor appetite for the past 1-2 weeks stating that she eats "no more than 2 meals a day." A meal might include cereal or bites of salmon with crab cakes. Pt states that prior to loss of appetite, a normal meal would include pork chops, vegetables, and potatoes.  Pt endorses recent weight fluctuations stating that her UBW is 224 lbs but that it has been as high as 232 lbs recently. Pt also reports that the most recent weight she can remember is 214 lbs at her doctor's office in January 2019.  Need dry weight for pt to accurately assess weight loss and estimate energy and protein needs. RD to follow. Weights in pt's chart appear to be stated rather than measured.  Pt and daughter with many nutrition-related questions. RD answered questions and encouraged pt to reach out if any new questions or concerns arise.  Medications reviewed.  Labs reviewed: CO2 21 (L), BUN 83 (H), creatinine 5.65 (H), phosphorus 6.4 (H), hemoglobin 8.6 (L), HCT 27.0 (L)  UOP: 1300 ml x 24 hours  NUTRITION - FOCUSED PHYSICAL EXAM:    Most Recent  Value  Orbital Region  No depletion  Upper Arm Region  No depletion  Thoracic and Lumbar Region  No depletion  Buccal Region  No depletion  Temple Region  No depletion  Clavicle Bone Region  No depletion  Clavicle and Acromion Bone Region  No depletion  Scapular Bone Region  Unable to assess  Dorsal Hand  Mild depletion  Patellar Region  Mild depletion  Anterior Thigh Region  No depletion  Posterior Calf Region  No depletion  Edema (RD Assessment)  Moderate [bilateral lower extremities]  Hair  Reviewed  Eyes  Reviewed  Mouth  Reviewed  Skin  Reviewed  Nails  Reviewed       Diet Order:   Diet Order           Diet NPO time specified Except for: Sips with Meds, Ice Chips  Diet effective midnight          EDUCATION NEEDS:   Education needs have been addressed  Skin:  Skin Assessment: Reviewed RN Assessment  Last BM:  unknown/PTA  Height:   Ht Readings from Last 1 Encounters:  06/27/17 5' 7.5" (1.715 m)    Weight:   Wt Readings from Last 1 Encounters:  06/27/17 220 lb (99.8 kg)    Ideal Body Weight:  62.5 kg  BMI:  Body mass index is 33.95 kg/m.  Estimated Nutritional Needs:   Kcal:  2000-2200 kcal/day  Protein:  115-130 grams/day  Fluid:  UOP + 1000 ml    Anda Kraft  Darrold Span, MS, RD, LDN Pager: [phone] Weekend/After Hours: [phone]

## 2017-06-28 NOTE — Progress Notes (Signed)
Subjective:  Maybe feels slightly better but not that much.  Appreciate IR quick response - on deck for [facility] today   Objective Vital signs in last 24 hours: Vitals:   06/27/17 2325 06/27/17 2352 06/28/17 0542 06/28/17 0856  BP: 93/60  110/64 139/61  Pulse: (!) 115  62 61  Resp: $Remo'16  16 18  'HDmOg$ Temp:   98.9 F (37.2 C) 98.7 F (37.1 C)  TempSrc:   Oral Oral  SpO2: 90%  100% 98%  Weight:      Height:  5' 7.5" (1.715 m)     Weight change:   Intake/Output Summary (Last 24 hours) at 06/28/2017 1125 Last data filed at 06/28/2017 0900 Gross per 24 hour  Intake 561.67 ml  Output 1500 ml  Net -938.33 ml    Assessment/Recommendations  1. 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 Progressive past year, Stage 5 pretty much since 02/2017. Clinically uremic. Needs to start dialysis.  1. Arrange for [facility], initiate HD once in- most likely first HD tomorrow 2. CLIP (I think will CLIP to [facility]) 3. Vmap for vascular access as below- will call [facility] in AM 2. Anemia - Fe studies ordered- sat 270- start ferrlecit, start Aranesp (100 mcg ordered [place] for 5/13) 3. Secondary HPT - hypercalcemic at 10.8. Stop outpt calcitriol and repeat PTH. Check phos- wait on binder for right now- want to see how HD will impact 4. Vascular access - vein mapping for AVF. Arrange for [facility] to start HD.  5. SLE - continue plaquenil 6. Hypertension - with symptomatic orthostasis and significant bradycardia. Cut atenolol and amlodipine in half. (Losartan was stopped last week)  Monitor orthostatics- no volume removal with HD for at least 1st treatment     Fraser Busche A    Labs: Basic Metabolic Panel: Recent Labs  Lab 06/23/17 1600 06/27/17 1553 06/28/17 0629  NA 132* 135 139  K 5.8* 5.4* 4.8  CL 101 103 106  CO2 21* 22 21*  GLUCOSE 97 111* 88  BUN 88* 81* 83*  CREATININE 5.70* 5.49* 5.65*  CALCIUM 9.9 10.8* 10.0  PHOS  --   --  6.4*   Liver Function Tests: Recent Labs  Lab 06/28/17 0629   ALBUMIN 2.9*   No results for input(s): LIPASE, AMYLASE in the last 168 hours. No results for input(s): AMMONIA in the last 168 hours. CBC: Recent Labs  Lab 06/23/17 1600 06/27/17 1553 06/28/17 0629  WBC 4.5 4.3 4.7  HGB 9.2* 9.3* 8.6*  HCT 28.4* 29.3* 27.0*  MCV 87.9 88.3 87.1  PLT 198 195 172   Cardiac Enzymes: No results for input(s): CKTOTAL, CKMB, CKMBINDEX, TROPONINI in the last 168 hours. CBG: No results for input(s): GLUCAP in the last 168 hours.  Iron Studies:  Recent Labs    06/28/17 0629  IRON 48  TIBC 175*   Studies/Results: Dg Chest 2 View  Result Date: 06/27/2017 CLINICAL DATA:  Exertional dyspnea. Orthostatic hypotension. Dizziness and fatigue. EXAM: CHEST - 2 VIEW COMPARISON:  06/23/2017 and 06/03/2014 FINDINGS: There is mild cardiomegaly. Pulmonary vascularity is normal and the lungs are clear except for a small area of linear scarring at the right lung base, unchanged since 2016. No effusions. Bones are normal. IMPRESSION: Stable mild cardiomegaly.  No acute abnormality. Electronically Signed   By: Lorriane Shire M.D.   On: 06/27/2017 16:51   Medications: Infusions: . sodium chloride 50 mL/hr at 06/27/17 2334  . sodium chloride    . vancomycin  Scheduled Medications: . amLODipine  5 mg Oral QHS  . atenolol  50 mg Oral QHS  . darbepoetin (ARANESP) injection - NON-DIALYSIS  100 mcg Subcutaneous Once  . [START ON 06/29/2017] heparin  5,000 Units Subcutaneous Q8H  . hydroxychloroquine  200 mg Oral BID  . kidney failure book   Does not apply Once  . sodium chloride flush  3 mL Intravenous Q12H    have reviewed scheduled and prn medications.  Physical Exam: General: obese, talkative, NAD Heart: RRR0 HR is better Lungs: mostly clear Abdomen: obese, soft Extremities: legs are obese- possibly just a little edema- slightly tender     06/28/2017,11:25 AM  LOS: 1 day

## 2017-06-28 NOTE — Consult Note (Signed)
Chief Complaint: Patient was seen in consultation today for tunneled dialysis catheter placement Chief Complaint  Patient presents with  . Abnormal Lab   at the request of Dr Sharyne Richters  Supervising Physician: Daryll Brod  Patient Status: [facility] - In-pt  History of Present Illness: Jenna [name] is a 62 y.o. female   Pt with SLE HTN; anemia- hyperparathyroidism Decline in renal fxn for few yrs Stage 5 CKD; on Tx list Generalized weakness;; fatigue; N/V  Request for tunneled dialysis catheter placement Initiation of dialysis   Past Medical History:  Diagnosis Date  . CKD (chronic kidney disease) stage 5, GFR less than 15 ml/min ([name]) 02/2017   Followed by Dr. Florene Glen  . FSGS (focal segmental glomerulosclerosis) 2011   By renal biopsy  . Hx of lupus nephritis 2011   by renal biopsy  . Lupus (systemic lupus erythematosus) ([name])    followed by Dr. Amil Amen  . Obesity   . Renal stone   . Secondary hyperparathyroidism of renal origin [facility])     History reviewed. No pertinent surgical history.  Allergies: Enalapril maleate; Chocolate; and Penicillins  Medications: Prior to Admission medications   Medication Sig Start Date End Date Taking? Authorizing Provider  acetaminophen (TYLENOL) 500 MG tablet Take 1,000 mg by mouth every 6 (six) hours as needed for headache.   Yes [provider]  amLODipine (NORVASC) 10 MG tablet Take 1 tablet (10 mg total) by mouth daily. 12/19/14  Yes Tresa Garter, MD  aspirin 81 MG tablet Take 1 tablet (81 mg total) by mouth daily. 12/25/13  Yes Tresa Garter, MD  atenolol (TENORMIN) 100 MG tablet Take 100 mg by mouth daily.   Yes [provider]  calcitRIOL (ROCALTROL) 0.25 MCG capsule Take 0.25 mcg by mouth daily.   Yes [provider]  calcium carbonate (TUMS - DOSED IN MG ELEMENTAL CALCIUM) 500 MG chewable tablet Chew 1 tablet by mouth daily as needed for indigestion or heartburn.   Yes [provider]  cholecalciferol (VITAMIN D) 1000 UNITS tablet Take 2,000 Units by mouth daily.   Yes [provider]  furosemide (LASIX) 40 MG tablet Take 40 mg by mouth daily.   Yes [provider]  HYDROcodone-acetaminophen (NORCO) 7.5-325 MG per tablet Take 1 tablet by mouth every 6 (six) hours as needed for moderate pain or severe pain.  05/31/14  Yes [provider]  hydroxychloroquine (PLAQUENIL) 200 MG tablet Take 1 tablet (200 mg total) by mouth 2 (two) times daily. 12/25/13  Yes Tresa Garter, MD  Multiple Vitamins-Minerals (MULTIVITAMIN & MINERAL PO) Take 1 tablet by mouth daily.   Yes [provider]  benzonatate (TESSALON) 100 MG capsule Take 1 capsule (100 mg total) by mouth every 8 (eight) hours. Patient not taking: Reported on 06/27/2017 02/09/17   Hedges, Dellis Filbert, PA-C  fluticasone [facility]) 50 MCG/ACT nasal spray Place 1 spray into both nostrils daily. Patient not taking: Reported on 06/27/2017 02/09/17   Okey Regal, PA-C  hydrocortisone cream 0.5 % Apply 1 application topically 2 (two) times daily. Patient not taking: Reported on 06/27/2017 12/19/14   Tresa Garter, MD  losartan (COZAAR) 100 MG tablet Take 1 tablet (100 mg total) by mouth daily. Patient not taking: Reported on 06/27/2017 01/19/16   Tresa Garter, MD     Family History  Problem Relation Age of Onset  . Diabetes Mother   . Hypertension Father   . Cancer Sister   . Hypertension Brother  Social History   Socioeconomic History  . Marital status: Legally Separated    Spouse name: Not on file  . Number of children: Not on file  . Years of education: Not on file  . Highest education level: Not on file  Occupational History  . Not on file  Social Needs  . Financial resource strain: Not on file  . Food insecurity:    Worry: Not on file    Inability: Not on file  . Transportation needs:    Medical: Not on file    Non-medical: Not on file    Tobacco Use  . Smoking status: Never Smoker  . Smokeless tobacco: Never Used  Substance and Sexual Activity  . Alcohol use: No  . Drug use: No  . Sexual activity: Not on file  Lifestyle  . Physical activity:    Days per week: Not on file    Minutes per session: Not on file  . Stress: Not on file  Relationships  . Social connections:    Talks on phone: Not on file    Gets together: Not on file    Attends religious service: Not on file    Active member of club or organization: Not on file    Attends meetings of clubs or organizations: Not on file    Relationship status: Not on file  Other Topics Concern  . Not on file  Social History Narrative  . Not on file    Review of Systems: A 12 point ROS discussed and pertinent positives are indicated in the HPI above.  All other systems are negative.  Review of Systems  Constitutional: Positive for activity change, appetite change, fatigue and unexpected weight change. Negative for fever.  HENT: Positive for tinnitus.   Respiratory: Negative for cough and shortness of breath.   Gastrointestinal: Negative for abdominal pain.  Neurological: Positive for weakness.  Psychiatric/Behavioral: Negative for behavioral problems and confusion.    Vital Signs: BP 110/64 (BP Location: Right Arm)   Pulse 62   Temp 98.9 F (37.2 C) (Oral)   Resp 16   Ht 5' 7.5" (1.715 m)   Wt 220 lb (99.8 kg)   SpO2 100%   BMI 33.95 kg/m   Physical Exam  Constitutional: She is oriented to person, place, and time.  Cardiovascular: Normal rate, regular rhythm and normal heart sounds.  Pulmonary/Chest: Effort normal and breath sounds normal.  Abdominal: Soft. Bowel sounds are normal.  Musculoskeletal: Normal range of motion.  Neurological: She is alert and oriented to person, place, and time.  Skin: Skin is warm.  Psychiatric: She has a normal mood and affect. Her behavior is normal. Judgment and thought content normal.  Nursing note and vitals  reviewed.   Imaging: Dg Chest 2 View  Result Date: 06/27/2017 CLINICAL DATA:  Exertional dyspnea. Orthostatic hypotension. Dizziness and fatigue. EXAM: CHEST - 2 VIEW COMPARISON:  06/23/2017 and 06/03/2014 FINDINGS: There is mild cardiomegaly. Pulmonary vascularity is normal and the lungs are clear except for a small area of linear scarring at the right lung base, unchanged since 2016. No effusions. Bones are normal. IMPRESSION: Stable mild cardiomegaly.  No acute abnormality. Electronically Signed   By: Lorriane Shire M.D.   On: 06/27/2017 16:51   Dg Chest 2 View  Result Date: 06/23/2017 CLINICAL DATA:  Per EMS, pt was at rehab for falls when she had a new onset of bradycardia in the 40s. Left bundle branch block, sinus brady per EMS EKG. Pt reported intermittent  left arm pain. EXAM: CHEST - 2 VIEW COMPARISON:  02/09/2017 FINDINGS: The cardiopericardial silhouette is enlarged and increased in prominence compared to prior studies but also accentuated by the AP position. There is no pulmonary edema. There are no focal consolidations or pleural effusions. Visualized osseous structures have a normal appearance. IMPRESSION: Enlarged cardiopericardial silhouette without edema. Findings raise the question of pericardial effusion. Electronically Signed   By: Nolon Nations M.D.   On: 06/23/2017 17:09    Labs:  CBC: Recent Labs    06/23/17 1600 06/27/17 1553 06/28/17 0629  WBC 4.5 4.3 4.7  HGB 9.2* 9.3* 8.6*  HCT 28.4* 29.3* 27.0*  PLT 198 195 172    COAGS: Recent Labs    06/28/17 0629  INR 1.09    BMP: Recent Labs    06/23/17 1600 06/27/17 1553 06/28/17 0629  NA 132* 135 139  K 5.8* 5.4* 4.8  CL 101 103 106  CO2 21* 22 21*  GLUCOSE 97 111* 88  BUN 88* 81* 83*  CALCIUM 9.9 10.8* 10.0  CREATININE 5.70* 5.49* 5.65*  GFRNONAA 7* 8* 7*  GFRAA 8* 9* 9*    LIVER FUNCTION TESTS: Recent Labs    06/28/17 0629  ALBUMIN 2.9*    TUMOR MARKERS: No results for input(s): AFPTM,  CEA, CA199, CHROMGRNA in the last 8760 hours.  Assessment and Plan:  Worsening renal function CKD 5 Lupus; Hyperparathyroidism/anemia Scheduled for initiation of dialysis Now for tunneled dialysis catheter placement Risks and benefits discussed with the patient including, but not limited to bleeding, infection, vascular injury, pneumothorax which may require chest tube placement, air embolism or even death  All of the patient's questions were answered, patient is agreeable to proceed. Consent signed and in chart.   Thank you for this interesting consult.  I greatly enjoyed meeting Jenna [name] and look forward to participating in their care.  A copy of this report was sent to the requesting provider on this date.  Electronically Signed: Lavonia Drafts, PA-C 06/28/2017, 7:50 AM   I spent a total of 20 Minutes    in face to face in clinical consultation, greater than 50% of which was counseling/coordinating care for HD tunneled cath

## 2017-06-28 NOTE — Progress Notes (Signed)
New Admission Note:   Arrival Method: Via stretcher from [facility] ED Mental Orientation: Alert and oriented x4 Telemetry: Box #5 Assessment: Completed Skin: See docflosheet IV: R Hand Pain: Denies Tubes: N/A Safety Measures: Safety Fall Prevention Plan has been discussed. Admission: Completed 34M Orientation: Patient has been orientated to the room, unit and staff.  Family: Daughter at bedside  Orders have been reviewed and implemented. Will continue to monitor the patient. Call light has been placed within reach and bed alarm has been activated.   Jaquita Bessire [employer], RN-BC Phone number: [phone]

## 2017-06-28 NOTE — Procedures (Signed)
CRF  S/P RT IJ HD CATH  TIP SVCRA NO COMP EBL MIN READY FOR USE FULL REPORT IN PACS

## 2017-06-29 DIAGNOSIS — Z992 Dependence on renal dialysis: Secondary | ICD-10-CM

## 2017-06-29 DIAGNOSIS — N186 End stage renal disease: Secondary | ICD-10-CM

## 2017-06-29 DIAGNOSIS — N189 Chronic kidney disease, unspecified: Secondary | ICD-10-CM

## 2017-06-29 DIAGNOSIS — Z862 Personal history of diseases of the blood and blood-forming organs and certain disorders involving the immune mechanism: Secondary | ICD-10-CM

## 2017-06-29 DIAGNOSIS — I10 Essential (primary) hypertension: Secondary | ICD-10-CM

## 2017-06-29 LAB — RENAL FUNCTION PANEL
Albumin: 2.9 g/dL — ABNORMAL LOW (ref 3.5–5.0)
Anion gap: 9 (ref 5–15)
BUN: 71 mg/dL — AB (ref 6–20)
CHLORIDE: 107 mmol/L (ref 101–111)
CO2: 22 mmol/L (ref 22–32)
CREATININE: 4.94 mg/dL — AB (ref 0.44–1.00)
Calcium: 9.4 mg/dL (ref 8.9–10.3)
GFR calc non Af Amer: 9 mL/min — ABNORMAL LOW (ref >60)
GFR, EST AFRICAN AMERICAN: 10 mL/min — AB (ref >60)
Glucose, Bld: 89 mg/dL (ref 65–99)
Phosphorus: 4.7 mg/dL — ABNORMAL HIGH (ref 2.5–4.6)
Potassium: 4.5 mmol/L (ref 3.5–5.1)
Sodium: 138 mmol/L (ref 135–145)

## 2017-06-29 LAB — CBC
HCT: 26.5 % — ABNORMAL LOW (ref 36.0–46.0)
Hemoglobin: 8.2 g/dL — ABNORMAL LOW (ref 12.0–15.0)
MCH: 27.6 pg (ref 26.0–34.0)
MCHC: 30.9 g/dL (ref 30.0–36.0)
MCV: 89.2 fL (ref 78.0–100.0)
PLATELETS: 172 10*3/uL (ref 150–400)
RBC: 2.97 MIL/uL — ABNORMAL LOW (ref 3.87–5.11)
RDW: 14.1 % (ref 11.5–15.5)
WBC: 4.3 10*3/uL (ref 4.0–10.5)

## 2017-06-29 LAB — PARATHYROID HORMONE, INTACT (NO CA): PTH: 45 pg/mL (ref 15–65)

## 2017-06-29 LAB — HEPATITIS B SURFACE ANTIGEN: Hepatitis B Surface Ag: NEGATIVE

## 2017-06-29 MED ORDER — SODIUM CHLORIDE 0.9 % IV SOLN
125.0000 mg | Freq: Every day | INTRAVENOUS | Status: AC
  Administered 2017-06-29 – 2017-07-02 (×4): 125 mg via INTRAVENOUS
  Filled 2017-06-29 (×5): qty 10

## 2017-06-29 NOTE — Procedures (Signed)
Patient was seen on dialysis and the procedure was supervised.  BFR 200  Via PC BP is  125/56.   Patient appears to be tolerating treatment well  Jena Tegeler A 06/29/2017

## 2017-06-29 NOTE — Evaluation (Signed)
Physical Therapy Evaluation Patient Details Name: Jenna [name] MRN: [id number] DOB: 1956/02/06 Today's Date: 06/29/2017   History of Present Illness  Pt is a 62 y/o female admitted secondary to dizziness, fatigue, and hypotension. Found to have ESRD and is s/p R IJ HD catheter placement. Pt also began HD during admission. PMH includes HTN and lupus.   Clinical Impression  Pt admitted secondary to problem above with deficits below. Pt very limited secondary to fatigue and dizziness this session and only able to tolerate short ambulation distance to bathroom. Pt also requiring multiple seated rests throughout session. Educated about DME recommendations and energy conservation techniques. Feel pt limited by fatigue secondary to HD session earlier, however, will need to progress mobility to ensure safest d/c location. If pt unable to progress, would benefit from short term SNF at d/c to increase safety and independence with mobility. Will continue to follow acutely to maximize functional mobility independence and safety.     Follow Up Recommendations Supervision for mobility/OOB;Other (comment)(SNF vs HHPT )    Equipment Recommendations  Other (comment)(rollator and tub bench )    Recommendations for Other Services       Precautions / Restrictions Precautions Precautions: Fall Restrictions Weight Bearing Restrictions: No      Mobility  Bed Mobility               General bed mobility comments: Sitting in chair upon entry.   Transfers Overall transfer level: Needs assistance Equipment used: Rolling walker (2 wheeled) Transfers: Sit to/from Stand Sit to Stand: Min assist         General transfer comment: Pt very fatigued and requiring frequent sitting rest breaks secondary to dizziness and fatigue. Performed sit<>stand 5-6 times throughout session. Min A for lift assist. Verbal cues for safe hand placcement.   Ambulation/Gait Ambulation/Gait assistance: Min guard;Min  assist Ambulation Distance (Feet): 15 Feet Assistive device: Rolling walker (2 wheeled) Gait Pattern/deviations: Step-through pattern;Decreased stride length;Trunk flexed Gait velocity: Decreased    General Gait Details: Slow, guarded gait. Mild unsteadiness noted requiring min to min guard A. Pt fatiguing easily and required seated rest during gait to bathroom. Pt also complaining of dizziness which limited ambulation tolerance.   Stairs            Wheelchair Mobility    Modified Rankin (Stroke Patients Only)       Balance Overall balance assessment: Needs assistance Sitting-balance support: Feet supported;No upper extremity supported Sitting balance-Leahy Scale: Good     Standing balance support: Bilateral upper extremity supported;During functional activity Standing balance-Leahy Scale: Poor Standing balance comment: Reliant on BUE support.                              Pertinent Vitals/Pain Pain Assessment: No/denies pain    Home Living Family/patient expects to be discharged to:: Private residence Living Arrangements: Alone Available Help at Discharge: Family;Available 24 hours/day Type of Home: Apartment Home Access: Stairs to enter Entrance Stairs-Rails: Right;Left;Can reach both Entrance Stairs-Number of Steps: flight  Home Layout: One level Home Equipment: Cane - single point      Prior Function Level of Independence: Independent with assistive device(s)         Comments: Using cane for mobility for past couple of days      Hand Dominance   Dominant Hand: Right    Extremity/Trunk Assessment   Upper Extremity Assessment Upper Extremity Assessment: Defer to OT evaluation  Lower Extremity Assessment Lower Extremity Assessment: Generalized weakness    Cervical / Trunk Assessment Cervical / Trunk Assessment: Normal  Communication   Communication: No difficulties  Cognition Arousal/Alertness: Awake/alert Behavior During  Therapy: WFL for tasks assessed/performed Overall Cognitive Status: Within Functional Limits for tasks assessed                                        General Comments General comments (skin integrity, edema, etc.): Pt's daughter present during session. Reviewed DME recommendations and techniques for energy conservation at home.     Exercises     Assessment/Plan    PT Assessment Patient needs continued PT services  PT Problem List Cardiopulmonary status limiting activity;Decreased knowledge of use of DME;Decreased balance;Decreased mobility;Decreased activity tolerance;Decreased strength       PT Treatment Interventions DME instruction;Gait training;Functional mobility training;Stair training;Therapeutic exercise;Therapeutic activities;Balance training;Patient/family education    PT Goals (Current goals can be found in the Care Plan section)  Acute Rehab PT Goals Patient Stated Goal: to get better  PT Goal Formulation: With patient Time For Goal Achievement: 07/13/17 Potential to Achieve Goals: Good    Frequency Min 3X/week   Barriers to discharge        Co-evaluation               AM-PAC PT "6 Clicks" Daily Activity  Outcome Measure Difficulty turning over in bed (including adjusting bedclothes, sheets and blankets)?: A Little Difficulty moving from lying on back to sitting on the side of the bed? : Unable Difficulty sitting down on and standing up from a chair with arms (e.g., wheelchair, bedside commode, etc,.)?: Unable Help needed moving to and from a bed to chair (including a wheelchair)?: A Little Help needed walking in hospital room?: A Little Help needed climbing 3-5 steps with a railing? : A Lot 6 Click Score: 13    End of Session Equipment Utilized During Treatment: Gait belt Activity Tolerance: Patient limited by fatigue;Treatment limited secondary to medical complications (Comment)(dizziness ) Patient left: in chair;with call  bell/phone within reach;with family/visitor present Nurse Communication: Mobility status;Other (comment)(pt had BM ) PT Visit Diagnosis: Unsteadiness on feet (R26.81);Other abnormalities of gait and mobility (R26.89);Difficulty in walking, not elsewhere classified (R26.2);Muscle weakness (generalized) (M62.81);Dizziness and giddiness (R42)    Time: 5885-0277 PT Time Calculation (min) (ACUTE ONLY): 49 min   Charges:   PT Evaluation $PT Eval Moderate Complexity: 1 Mod PT Treatments $Therapeutic Activity: 23-37 mins   PT G Codes:        Leighton Ruff, PT, DPT  Acute Rehabilitation Services  Pager: [phone]   Rudean Hitt 06/29/2017, 6:45 PM

## 2017-06-29 NOTE — Care Management Note (Addendum)
Case Management Note  Patient Details  Name: CHERYLANN HOBDAY MRN: [id number] Date of Birth: 01-20-1956  Subjective/Objective:  ESRD, new HD, hx Lupus                   Action/Plan: NCM spoke to pt and Debborah, [place] # [phone] at bedside. Pt states she was going to OPPT at [facility]. Waiting PT/OT evaluation.  Offered choice for HHPT/list provided. Pt dtr requesting RW with seat. Contacted [facility] for RW with seat.   Expected Discharge Date:                  Expected Discharge Plan:  [place]  In-House Referral:  NA  Discharge planning Services  CM Consult  Post Acute Care Choice:  Home Health Choice offered to:  Adult Children  DME Arranged:  Walker rolling with seat DME Agency:  [place]:  PT [place] Agency:     Status of Service:  In process, will continue to follow  If discussed at Long Length of Stay Meetings, dates discussed:    Additional Comments:  Erenest Rasher, RN 06/29/2017, 1:04 PM

## 2017-06-29 NOTE — Progress Notes (Signed)
Subjective:  Seen in HD for first tx.  Vein mapping done Objective Vital signs in last 24 hours: Vitals:   06/28/17 1520 06/28/17 1603 06/28/17 2055 06/29/17 0556  BP: (!) 150/78 (!) 142/79 (!) 145/70 (!) 125/56  Pulse: 62 60 (!) 58 (!) 58  Resp: $Remo'14 18 17 16  'BZZfq$ Temp:   98.3 F (36.8 C) 98.8 F (37.1 C)  TempSrc:   Oral Oral  SpO2: 100% 100% 100% 98%  Weight:   101.1 kg (222 lb 14.2 oz)   Height:       Weight change: 1.309 kg (2 lb 14.2 oz)  Intake/Output Summary (Last 24 hours) at 06/29/2017 0174 Last data filed at 06/29/2017 0600 Gross per 24 hour  Intake 920 ml  Output 700 ml  Net 220 ml    Assessment/Recommendations  1. 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 Progressive past year, Stage 5 pretty much since 02/2017. Clinically uremic. Needs to start dialysis.  1. S/p  [facility] 5/14, first HD today, second tomorrow  2. CLIP (I think will CLIP to [facility]) 3. Vmap done- have consulted [facility] 2. Anemia - Fe studies ordered- sat 270- start ferrlecit, start Aranesp (100 mcg ordered [facility] for 5/13) 3. Secondary HPT - hypercalcemic at 10.8. Stop outpt calcitriol and repeat PTH. Check phos- wait on binder for right now- want to see how HD will impact 4. Vascular access - vein mapping for AVF. TDC to start HD- [facility] called.  5. SLE - continue plaquenil 6. Hypertension - with symptomatic orthostasis and significant bradycardia. Cut atenolol and amlodipine in half. (Losartan was stopped last week)  Monitor orthostatics- no volume removal with HD for at least 1st treatment     Lyam Provencio A    Labs: Basic Metabolic Panel: Recent Labs  Lab 06/23/17 1600 06/27/17 1553 06/28/17 0629  NA 132* 135 139  K 5.8* 5.4* 4.8  CL 101 103 106  CO2 21* 22 21*  GLUCOSE 97 111* 88  BUN 88* 81* 83*  CREATININE 5.70* 5.49* 5.65*  CALCIUM 9.9 10.8* 10.0  PHOS  --   --  6.4*   Liver Function Tests: Recent Labs  Lab 06/28/17 0629  ALBUMIN 2.9*   No results for input(s): LIPASE,  AMYLASE in the last 168 hours. No results for input(s): AMMONIA in the last 168 hours. CBC: Recent Labs  Lab 06/23/17 1600 06/27/17 1553 06/28/17 0629  WBC 4.5 4.3 4.7  HGB 9.2* 9.3* 8.6*  HCT 28.4* 29.3* 27.0*  MCV 87.9 88.3 87.1  PLT 198 195 172   Cardiac Enzymes: No results for input(s): CKTOTAL, CKMB, CKMBINDEX, TROPONINI in the last 168 hours. CBG: No results for input(s): GLUCAP in the last 168 hours.  Iron Studies:  Recent Labs    06/28/17 0629  IRON 48  TIBC 175*   Studies/Results: Dg Chest 2 View  Result Date: 06/27/2017 CLINICAL DATA:  Exertional dyspnea. Orthostatic hypotension. Dizziness and fatigue. EXAM: CHEST - 2 VIEW COMPARISON:  06/23/2017 and 06/03/2014 FINDINGS: There is mild cardiomegaly. Pulmonary vascularity is normal and the lungs are clear except for a small area of linear scarring at the right lung base, unchanged since 2016. No effusions. Bones are normal. IMPRESSION: Stable mild cardiomegaly.  No acute abnormality. Electronically Signed   By: Lorriane Shire M.D.   On: 06/27/2017 16:51   Ir Fluoro Guide Cv Line Right  Result Date: 06/28/2017 INDICATION: END-STAGE RENAL DISEASE, NO CURRENT ACCESS FOR DIALYSIS EXAM: ULTRASOUND GUIDANCE FOR VASCULAR ACCESS RIGHT INTERNAL  JUGULAR PERMANENT HEMODIALYSIS CATHETER Date:  06/28/2017 06/28/2017 3:21 pm Radiologist:  M. Daryll Brod, MD Guidance:  Ultrasound and fluoroscopic FLUOROSCOPY TIME:  Fluoroscopy Time:  18 seconds (1 mGy). MEDICATIONS: Vancomycin 1 g image are within 1 of the procedure ANESTHESIA/SEDATION: Versed 1.5 mg IV; Fentanyl 70 mcg IV; Moderate Sedation Time:  13 minutes The patient was continuously monitored during the procedure by the interventional radiology nurse under my direct supervision. CONTRAST:  None. COMPLICATIONS: None immediate. PROCEDURE: Informed consent was obtained from the patient following explanation of the procedure, risks, benefits and alternatives. The patient understands, agrees  and consents for the procedure. All questions were addressed. A time out was performed. Maximal barrier sterile technique utilized including caps, mask, sterile gowns, sterile gloves, large sterile drape, hand hygiene, and 2% chlorhexidine scrub. Under sterile conditions and local anesthesia, right internal jugular micropuncture venous access was performed with ultrasound. Images were obtained for documentation of the patent right internal jugular vein. A guide wire was inserted followed by a transitional dilator. Next, a 0.035 guidewire was advanced into the IVC with a 5-French catheter. Measurements were obtained from the right venotomy site to the proximal right atrium. In the right infraclavicular chest, a subcutaneous tunnel was created under sterile conditions and local anesthesia. 1% lidocaine with epinephrine was utilized for this. The 19 cm tip to cuff palindrome catheter was tunneled subcutaneously to the venotomy site and inserted into the SVC/RA junction through a valved peel-away sheath. Position was confirmed with fluoroscopy. Images were obtained for documentation. Blood was aspirated from the catheter followed by saline and heparin flushes. The appropriate volume and strength of heparin was instilled in each lumen. Caps were applied. The catheter was secured at the tunnel site with Gelfoam and a pursestring suture. The venotomy site was closed with subcuticular Vicryl suture. Dermabond was applied to the small right neck incision. A dry sterile dressing was applied. The catheter is ready for use. No immediate complications. IMPRESSION: Ultrasound and fluoroscopically guided right internal jugular tunneled hemodialysis catheter (19 cm tip to cuff palindrome catheter). Electronically Signed   By: Jerilynn Mages.  Shick M.D.   On: 06/28/2017 15:23   Ir US Guide Vasc Access Right  Result Date: 06/28/2017 INDICATION: END-STAGE RENAL DISEASE, NO CURRENT ACCESS FOR DIALYSIS EXAM: ULTRASOUND GUIDANCE FOR VASCULAR  ACCESS RIGHT INTERNAL JUGULAR PERMANENT HEMODIALYSIS CATHETER Date:  06/28/2017 06/28/2017 3:21 pm Radiologist:  Jerilynn Mages. Daryll Brod, MD Guidance:  Ultrasound and fluoroscopic FLUOROSCOPY TIME:  Fluoroscopy Time:  18 seconds (1 mGy). MEDICATIONS: Vancomycin 1 g image are within 1 of the procedure ANESTHESIA/SEDATION: Versed 1.5 mg IV; Fentanyl 70 mcg IV; Moderate Sedation Time:  13 minutes The patient was continuously monitored during the procedure by the interventional radiology nurse under my direct supervision. CONTRAST:  None. COMPLICATIONS: None immediate. PROCEDURE: Informed consent was obtained from the patient following explanation of the procedure, risks, benefits and alternatives. The patient understands, agrees and consents for the procedure. All questions were addressed. A time out was performed. Maximal barrier sterile technique utilized including caps, mask, sterile gowns, sterile gloves, large sterile drape, hand hygiene, and 2% chlorhexidine scrub. Under sterile conditions and local anesthesia, right internal jugular micropuncture venous access was performed with ultrasound. Images were obtained for documentation of the patent right internal jugular vein. A guide wire was inserted followed by a transitional dilator. Next, a 0.035 guidewire was advanced into the IVC with a 5-French catheter. Measurements were obtained from the right venotomy site to the proximal right atrium. In the right  infraclavicular chest, a subcutaneous tunnel was created under sterile conditions and local anesthesia. 1% lidocaine with epinephrine was utilized for this. The 19 cm tip to cuff palindrome catheter was tunneled subcutaneously to the venotomy site and inserted into the SVC/RA junction through a valved peel-away sheath. Position was confirmed with fluoroscopy. Images were obtained for documentation. Blood was aspirated from the catheter followed by saline and heparin flushes. The appropriate volume and strength of heparin  was instilled in each lumen. Caps were applied. The catheter was secured at the tunnel site with Gelfoam and a pursestring suture. The venotomy site was closed with subcuticular Vicryl suture. Dermabond was applied to the small right neck incision. A dry sterile dressing was applied. The catheter is ready for use. No immediate complications. IMPRESSION: Ultrasound and fluoroscopically guided right internal jugular tunneled hemodialysis catheter (19 cm tip to cuff palindrome catheter). Electronically Signed   By: Jerilynn Mages.  Shick M.D.   On: 06/28/2017 15:23   Medications: Infusions: . sodium chloride    . ferric gluconate (FERRLECIT/NULECIT) IV      Scheduled Medications: . amLODipine  5 mg Oral QHS  . atenolol  50 mg Oral QHS  . feeding supplement (NEPRO CARB STEADY)  237 mL Oral BID BM  . heparin  5,000 Units Subcutaneous Q8H  . hydroxychloroquine  200 mg Oral BID  . sodium chloride flush  3 mL Intravenous Q12H    have reviewed scheduled and prn medications.  Physical Exam: General: obese, talkative, NAD Heart: RRR0 HR is better Lungs: mostly clear Abdomen: obese, soft Extremities: legs are obese- possibly just a little edema- slightly tender     06/29/2017,9:22 AM  LOS: 2 days

## 2017-06-29 NOTE — Consult Note (Addendum)
Hospital Consult    Reason for Consult:  Permanent dialysis access Requesting Physician:  Dr. Moshe Cipro MRN #:  [id number]  History of Present Illness: This is a 62 y.o. female with ESRD now on HD.  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  She is s/p placement of R IJ TDC by IR yesterday.  She is R arm dominant and would prefer access creation in L arm.  Vein mapping has been performed and demonstrates adequate cephalic vein bilaterally.  She is not taking any blood thinners.  She was examined in hemodialysis unit today.  Past Medical History:  Diagnosis Date  . CKD (chronic kidney disease) stage 5, GFR less than 15 ml/min ([name]) 02/2017   Followed by Dr. Florene Glen  . FSGS (focal segmental glomerulosclerosis) 2011   By renal biopsy  . Hx of lupus nephritis 2011   by renal biopsy  . Lupus (systemic lupus erythematosus) ([name])    followed by Dr. Amil Amen  . Obesity   . Renal stone   . Secondary hyperparathyroidism of renal origin [facility])     Past Surgical History:  Procedure Laterality Date  . IR FLUORO GUIDE CV LINE RIGHT  06/28/2017  . IR US GUIDE VASC ACCESS RIGHT  06/28/2017    Allergies  Allergen Reactions  . Enalapril Maleate Other (See Comments)    Throat swelling  . Chocolate Nausea And Vomiting  . Penicillins Other (See Comments)    Has patient had a PCN reaction causing immediate rash, facial/tongue/throat swelling, SOB or lightheadedness with hypotension: No Has patient had a PCN reaction causing severe rash involving mucus membranes or skin necrosis: No Has patient had a PCN reaction that required hospitalization: No Has patient had a PCN reaction occurring within the last 10 years: No If all of the above answers are "NO", then may proceed with Cephalosporin use.     Prior to Admission medications   Medication Sig Start Date End Date Taking? Authorizing Provider  acetaminophen (TYLENOL) 500 MG tablet Take 1,000 mg by mouth  every 6 (six) hours as needed for headache.   Yes [provider]  amLODipine (NORVASC) 10 MG tablet Take 1 tablet (10 mg total) by mouth daily. 12/19/14  Yes Tresa Garter, MD  aspirin 81 MG tablet Take 1 tablet (81 mg total) by mouth daily. 12/25/13  Yes Tresa Garter, MD  atenolol (TENORMIN) 100 MG tablet Take 100 mg by mouth daily.   Yes [provider]  calcitRIOL (ROCALTROL) 0.25 MCG capsule Take 0.25 mcg by mouth daily.   Yes [provider]  calcium carbonate (TUMS - DOSED IN MG ELEMENTAL CALCIUM) 500 MG chewable tablet Chew 1 tablet by mouth daily as needed for indigestion or heartburn.   Yes [provider]  cholecalciferol (VITAMIN D) 1000 UNITS tablet Take 2,000 Units by mouth daily.   Yes [provider]  furosemide (LASIX) 40 MG tablet Take 40 mg by mouth daily.   Yes [provider]  HYDROcodone-acetaminophen (NORCO) 7.5-325 MG per tablet Take 1 tablet by mouth every 6 (six) hours as needed for moderate pain or severe pain.  05/31/14  Yes [provider]  hydroxychloroquine (PLAQUENIL) 200 MG tablet Take 1 tablet (200 mg total) by mouth 2 (two) times daily. 12/25/13  Yes Tresa Garter, MD  Multiple Vitamins-Minerals (MULTIVITAMIN & MINERAL PO) Take 1 tablet by mouth daily.   Yes [provider]  benzonatate (TESSALON) 100 MG capsule Take 1 capsule (  100 mg total) by mouth every 8 (eight) hours. Patient not taking: Reported on 06/27/2017 02/09/17   Hedges, Dellis Filbert, PA-C  fluticasone [facility]) 50 MCG/ACT nasal spray Place 1 spray into both nostrils daily. Patient not taking: Reported on 06/27/2017 02/09/17   Okey Regal, PA-C  hydrocortisone cream 0.5 % Apply 1 application topically 2 (two) times daily. Patient not taking: Reported on 06/27/2017 12/19/14   Tresa Garter, MD  losartan (COZAAR) 100 MG tablet Take 1 tablet (100 mg total) by mouth daily. Patient not taking: Reported on  06/27/2017 01/19/16   Tresa Garter, MD    Social History   Socioeconomic History  . Marital status: Legally Separated    Spouse name: Not on file  . Number of children: Not on file  . Years of education: Not on file  . Highest education level: Not on file  Occupational History  . Not on file  Social Needs  . Financial resource strain: Not on file  . Food insecurity:    Worry: Not on file    Inability: Not on file  . Transportation needs:    Medical: Not on file    Non-medical: Not on file  Tobacco Use  . Smoking status: Never Smoker  . Smokeless tobacco: Never Used  Substance and Sexual Activity  . Alcohol use: No  . Drug use: No  . Sexual activity: Not on file  Lifestyle  . Physical activity:    Days per week: Not on file    Minutes per session: Not on file  . Stress: Not on file  Relationships  . Social connections:    Talks on phone: Not on file    Gets together: Not on file    Attends religious service: Not on file    Active member of club or organization: Not on file    Attends meetings of clubs or organizations: Not on file    Relationship status: Not on file  . Intimate partner violence:    Fear of current or ex partner: Not on file    Emotionally abused: Not on file    Physically abused: Not on file    Forced sexual activity: Not on file  Other Topics Concern  . Not on file  Social History Narrative  . Not on file     Family History  Problem Relation Age of Onset  . Diabetes Mother   . Hypertension Father   . Cancer Sister   . Hypertension Brother     ROS: Otherwise negative unless mentioned in HPI  Physical Examination  Vitals:   06/29/17 0556 06/29/17 0830  BP: (!) 125/56 (!) 153/76  Pulse: (!) 58 (!) 54  Resp: 16 18  Temp: 98.8 F (37.1 C) (!) 97 F (36.1 C)  SpO2: 98% 98%   Body mass index is 33.2 kg/m.  General:  WDWN in NAD Gait: Not observed HENT: WNL, normocephalic Pulmonary: normal non-labored breathing, without  Rales, rhonchi,  wheezing Cardiac: regular Abdomen:  soft, NT/ND, no masses Skin: without rashes Vascular Exam/Pulses: symmetrical radials; palpable DP bilaterally Extremities: without ischemic changes, without Gangrene , without cellulitis; without open wounds;  Musculoskeletal: no muscle wasting or atrophy  Neurologic: A&O X 3;  No focal weakness or paresthesias are detected; speech is fluent/normal Psychiatric:  The pt has Normal affect. Lymph:  Unremarkable  CBC    Component Value Date/Time   WBC 4.3 06/29/2017 0925   RBC 2.97 (L) 06/29/2017 0925   HGB 8.2 (L) 06/29/2017 4431  HCT 26.5 (L) 06/29/2017 0925   PLT 172 06/29/2017 0925   MCV 89.2 06/29/2017 0925   MCV 82.4 04/28/2011 1841   MCH 27.6 06/29/2017 0925   MCHC 30.9 06/29/2017 0925   RDW 14.1 06/29/2017 0925   LYMPHSABS 0.8 05/23/2014 1301   MONOABS 0.2 05/23/2014 1301   EOSABS 0.1 05/23/2014 1301   BASOSABS 0.0 05/23/2014 1301    BMET    Component Value Date/Time   NA 139 06/28/2017 0629   K 4.8 06/28/2017 0629   CL 106 06/28/2017 0629   CO2 21 (L) 06/28/2017 0629   GLUCOSE 88 06/28/2017 0629   BUN 83 (H) 06/28/2017 0629   CREATININE 5.65 (H) 06/28/2017 0629   CREATININE 2.11 (H) 05/23/2014 1301   CALCIUM 10.0 06/28/2017 0629   CALCIUM 6.9 (L) 01/23/2010 0616   GFRNONAA 7 (L) 06/28/2017 0629   GFRNONAA 25 (L) 05/23/2014 1301   GFRAA 9 (L) 06/28/2017 0629   GFRAA 29 (L) 05/23/2014 1301    COAGS: Lab Results  Component Value Date   INR 1.09 06/28/2017   INR 1.06 05/05/2013   INR 0.98 01/23/2010     Non-Invasive Vascular Imaging:   Upper Extremity Vein Map  Right Cephalic  Segment Diameter Depth Comment  1. Axilla 4.54mm 13.43mm   2. Mid upper arm 2.70mm 9.49mm   3. Above AC 2.15mm 6.72mm   4. In [facility] 7.4mm 2.49mm   5. Below AC 3.43mm 6.63mm   6. Mid forearm 2.93mm 6.59mm   7. Wrist 3.20mm 8.43mm                   Right Basilic- not visualized   Left  Cephalic  Segment Diameter Depth Comment  1. Axilla 4.39mm 10.107mm   2. Mid upper arm 5.53mm 5.43mm   3. Above AC 5.35mm 5.40mm   4. In [facility] 8.57mm 2.35mm   5. Below AC 4.91mm 5.27mm Branching  6. Mid forearm 3.20mm 5.53mm   7. Wrist 2.55mm 4.42mm                   Left Basilic- not visualized     Statin:  No. Beta Blocker:  Yes.   Aspirin:  Yes.   ACEI:  No. ARB:  Yes.   CCB use:  Yes Other antiplatelets/anticoagulants:  No.    ASSESSMENT/PLAN: This is a 62 y.o. female with ESRD on hemodialysis initiated yesterday after placement of R IJ TDC by IR  -HD via R IJ TDC again today per Nephrology -Vein mapping demonstrates adequate cephalic vein in L arm -Restrict L arm -Plan will be for L arm radial vs brachiocephalic fistula creation -Dr. Oneida Alar will evaluate the patient later today   Dagoberto Ligas PA-C [facility] [phone]  History and exam details as above.  Pt is right handed.  ESRD 2nd to lupus.  Currently has right side catheter.  Will schedule for Monday or Tuesday next week for left radial vs brachial cephalic AVF.  Risks benefits possible complications discussed with pt and daughter including but not limited to bleeding infection non maturation ischemic steal possible additional procedures.  They wish to proceed.  Ruta Hinds, MD [facility] [place] Office: [phone] Pager: [phone]

## 2017-06-29 NOTE — Progress Notes (Signed)
[facility] PROGRESS NOTE  Jenna [name] SHF:[id number] DOB: [birth year] DOA: 06/27/2017  PCP: Rogers Blocker, MD  Brief History/Interval Summary: 62 y.o.femalewith medical history significant ofchronic kidney disease stage IV secondary to lupus nephritis and FSGS who is being followed in the outpatient setting and was being evaluated for possible kidney transplant. Her primary nephrologist Dr. Florene Glen has been managing her renal function but over the last month she's had progressive shortness of breath with exertion. Associated with weakness. Patient has also noted some lower extremity edema, she was seen by the nephrologist in the office and sent here to prepare for dialysis commencement.  Reason for Visit: End-stage renal disease  Consultants: Nephrology  Procedures:  Tunneled right IJ dialysis catheter  Hemodialysis  Antibiotics: None  Subjective/Interval History: Patient seen while she was at dialysis.  Patient seems to be nervous about initiating dialysis.  She denies any chest pain or shortness of breath currently.  ROS: Denies any nausea or vomiting  Objective:  Vital Signs  Vitals:   06/29/17 0930 06/29/17 1000 06/29/17 1030 06/29/17 1100  BP: 136/69 122/81 138/70 136/78  Pulse: (!) 57 (!) 58 64 63  Resp: $Remo'14 18 18 18  'YmlTW$ Temp:      TempSrc:      SpO2:      Weight:      Height:        Intake/Output Summary (Last 24 hours) at 06/29/2017 1147 Last data filed at 06/29/2017 0600 Gross per 24 hour  Intake 720 ml  Output 700 ml  Net 20 ml   Filed Weights   06/27/17 2307 06/28/17 2055 06/29/17 0830  Weight: 99.8 kg (220 lb) 101.1 kg (222 lb 14.2 oz) 97.6 kg (215 lb 2.7 oz)    General appearance: alert, cooperative, appears stated age and no distress Head: Normocephalic, without obvious abnormality, atraumatic Throat: lips, mucosa, and tongue normal; teeth and gums normal Resp: clear to auscultation bilaterally Cardio: regular rate and rhythm, S1, S2  normal, no murmur, click, rub or gallop GI: soft, non-tender; bowel sounds normal; no masses,  no organomegaly Extremities: extremities normal, atraumatic, no cyanosis or edema Neurological: Awake alert.  Oriented x3.  No obvious focal neurological deficits.  Lab Results:  Data Reviewed: I have personally reviewed following labs and imaging studies  CBC: Recent Labs  Lab 06/23/17 1600 06/27/17 1553 06/28/17 0629 06/29/17 0925  WBC 4.5 4.3 4.7 4.3  HGB 9.2* 9.3* 8.6* 8.2*  HCT 28.4* 29.3* 27.0* 26.5*  MCV 87.9 88.3 87.1 89.2  PLT 198 195 172 502    Basic Metabolic Panel: Recent Labs  Lab 06/23/17 1600 06/27/17 1553 06/28/17 0629 06/29/17 0926  NA 132* 135 139 138  K 5.8* 5.4* 4.8 4.5  CL 101 103 106 107  CO2 21* 22 21* 22  GLUCOSE 97 111* 88 89  BUN 88* 81* 83* 71*  CREATININE 5.70* 5.49* 5.65* 4.94*  CALCIUM 9.9 10.8* 10.0 9.4  PHOS  --   --  6.4* 4.7*    GFR: Estimated Creatinine Clearance: 14.5 mL/min (A) (by C-G formula based on SCr of 4.94 mg/dL (H)).  Liver Function Tests: Recent Labs  Lab 06/28/17 0629 06/29/17 0926  ALBUMIN 2.9* 2.9*    Coagulation Profile: Recent Labs  Lab 06/28/17 0629  INR 1.09   Anemia Panel: Recent Labs    06/28/17 0629  TIBC 175*  IRON 48    Radiology Studies: Dg Chest 2 View  Result Date: 06/27/2017 CLINICAL DATA:  Exertional dyspnea. Orthostatic hypotension.  Dizziness and fatigue. EXAM: CHEST - 2 VIEW COMPARISON:  06/23/2017 and 06/03/2014 FINDINGS: There is mild cardiomegaly. Pulmonary vascularity is normal and the lungs are clear except for a small area of linear scarring at the right lung base, unchanged since 2016. No effusions. Bones are normal. IMPRESSION: Stable mild cardiomegaly.  No acute abnormality. Electronically Signed   By: Lorriane Shire M.D.   On: 06/27/2017 16:51   Ir Fluoro Guide Cv Line Right  Result Date: 06/28/2017 INDICATION: END-STAGE RENAL DISEASE, NO CURRENT ACCESS FOR DIALYSIS EXAM:  ULTRASOUND GUIDANCE FOR VASCULAR ACCESS RIGHT INTERNAL JUGULAR PERMANENT HEMODIALYSIS CATHETER Date:  06/28/2017 06/28/2017 3:21 pm Radiologist:  Jerilynn Mages. Daryll Brod, MD Guidance:  Ultrasound and fluoroscopic FLUOROSCOPY TIME:  Fluoroscopy Time:  18 seconds (1 mGy). MEDICATIONS: Vancomycin 1 g image are within 1 of the procedure ANESTHESIA/SEDATION: Versed 1.5 mg IV; Fentanyl 70 mcg IV; Moderate Sedation Time:  13 minutes The patient was continuously monitored during the procedure by the interventional radiology nurse under my direct supervision. CONTRAST:  None. COMPLICATIONS: None immediate. PROCEDURE: Informed consent was obtained from the patient following explanation of the procedure, risks, benefits and alternatives. The patient understands, agrees and consents for the procedure. All questions were addressed. A time out was performed. Maximal barrier sterile technique utilized including caps, mask, sterile gowns, sterile gloves, large sterile drape, hand hygiene, and 2% chlorhexidine scrub. Under sterile conditions and local anesthesia, right internal jugular micropuncture venous access was performed with ultrasound. Images were obtained for documentation of the patent right internal jugular vein. A guide wire was inserted followed by a transitional dilator. Next, a 0.035 guidewire was advanced into the IVC with a 5-French catheter. Measurements were obtained from the right venotomy site to the proximal right atrium. In the right infraclavicular chest, a subcutaneous tunnel was created under sterile conditions and local anesthesia. 1% lidocaine with epinephrine was utilized for this. The 19 cm tip to cuff palindrome catheter was tunneled subcutaneously to the venotomy site and inserted into the SVC/RA junction through a valved peel-away sheath. Position was confirmed with fluoroscopy. Images were obtained for documentation. Blood was aspirated from the catheter followed by saline and heparin flushes. The appropriate  volume and strength of heparin was instilled in each lumen. Caps were applied. The catheter was secured at the tunnel site with Gelfoam and a pursestring suture. The venotomy site was closed with subcuticular Vicryl suture. Dermabond was applied to the small right neck incision. A dry sterile dressing was applied. The catheter is ready for use. No immediate complications. IMPRESSION: Ultrasound and fluoroscopically guided right internal jugular tunneled hemodialysis catheter (19 cm tip to cuff palindrome catheter). Electronically Signed   By: Jerilynn Mages.  Shick M.D.   On: 06/28/2017 15:23   Ir US Guide Vasc Access Right  Result Date: 06/28/2017 INDICATION: END-STAGE RENAL DISEASE, NO CURRENT ACCESS FOR DIALYSIS EXAM: ULTRASOUND GUIDANCE FOR VASCULAR ACCESS RIGHT INTERNAL JUGULAR PERMANENT HEMODIALYSIS CATHETER Date:  06/28/2017 06/28/2017 3:21 pm Radiologist:  Jerilynn Mages. Daryll Brod, MD Guidance:  Ultrasound and fluoroscopic FLUOROSCOPY TIME:  Fluoroscopy Time:  18 seconds (1 mGy). MEDICATIONS: Vancomycin 1 g image are within 1 of the procedure ANESTHESIA/SEDATION: Versed 1.5 mg IV; Fentanyl 70 mcg IV; Moderate Sedation Time:  13 minutes The patient was continuously monitored during the procedure by the interventional radiology nurse under my direct supervision. CONTRAST:  None. COMPLICATIONS: None immediate. PROCEDURE: Informed consent was obtained from the patient following explanation of the procedure, risks, benefits and alternatives. The patient understands, agrees and consents for  the procedure. All questions were addressed. A time out was performed. Maximal barrier sterile technique utilized including caps, mask, sterile gowns, sterile gloves, large sterile drape, hand hygiene, and 2% chlorhexidine scrub. Under sterile conditions and local anesthesia, right internal jugular micropuncture venous access was performed with ultrasound. Images were obtained for documentation of the patent right internal jugular vein. A guide  wire was inserted followed by a transitional dilator. Next, a 0.035 guidewire was advanced into the IVC with a 5-French catheter. Measurements were obtained from the right venotomy site to the proximal right atrium. In the right infraclavicular chest, a subcutaneous tunnel was created under sterile conditions and local anesthesia. 1% lidocaine with epinephrine was utilized for this. The 19 cm tip to cuff palindrome catheter was tunneled subcutaneously to the venotomy site and inserted into the SVC/RA junction through a valved peel-away sheath. Position was confirmed with fluoroscopy. Images were obtained for documentation. Blood was aspirated from the catheter followed by saline and heparin flushes. The appropriate volume and strength of heparin was instilled in each lumen. Caps were applied. The catheter was secured at the tunnel site with Gelfoam and a pursestring suture. The venotomy site was closed with subcuticular Vicryl suture. Dermabond was applied to the small right neck incision. A dry sterile dressing was applied. The catheter is ready for use. No immediate complications. IMPRESSION: Ultrasound and fluoroscopically guided right internal jugular tunneled hemodialysis catheter (19 cm tip to cuff palindrome catheter). Electronically Signed   By: Jerilynn Mages.  Shick M.D.   On: 06/28/2017 15:23     Medications:  Scheduled: . amLODipine  5 mg Oral QHS  . atenolol  50 mg Oral QHS  . feeding supplement (NEPRO CARB STEADY)  237 mL Oral BID BM  . heparin  5,000 Units Subcutaneous Q8H  . hydroxychloroquine  200 mg Oral BID  . sodium chloride flush  3 mL Intravenous Q12H   Continuous: . sodium chloride    . ferric gluconate (FERRLECIT/NULECIT) IV 125 mg (06/29/17 1055)   HCW:CBJSEG chloride, acetaminophen **OR** acetaminophen, calcium carbonate (dosed in mg elemental calcium), camphor-menthol **AND** hydrOXYzine, docusate sodium, HYDROcodone-acetaminophen, ondansetron **OR** ondansetron (ZOFRAN) IV, sodium  chloride flush, sorbitol, zolpidem  Assessment/Plan:  Principal Problem:   End stage renal disease ([name]) Active Problems:   Essential hypertension   Hx of lupus nephritis    History of chronic kidney disease stage V now and end-stage renal disease  Patient with worsening symptoms over the last 1 month including shortness of breath and fatigue.  She is thought to have progressed to end-stage renal disease.  Patient has CKD due to underlying FSGS/lupus nephritis.  Tunneled dialysis catheter was placed 5/14.  Patient to be dialyzed today for the first time.  Nephrology is following.   History of SLE Stable on Plaquenil. Continue.  Essential hypertension Continue home dose beta-blocker and Norvasc. ARB on hold currently.  Anemia of chronic disease Stable no acute issues.  No overt bleeding.  DVT Prophylaxis: Subcutaneous heparin    Code Status: Full code Family Communication: Discussed with the patient Disposition Plan: Management as outlined above.    LOS: 2 days   [facility] Pager [phone] 06/29/2017, 11:47 AM  If 7PM-7AM, please contact night-coverage at [email or web address], password [facility]

## 2017-06-30 ENCOUNTER — Other Ambulatory Visit: Payer: Self-pay

## 2017-06-30 DIAGNOSIS — Z87448 Personal history of other diseases of urinary system: Secondary | ICD-10-CM

## 2017-06-30 LAB — CBC
HEMATOCRIT: 26.1 % — AB (ref 36.0–46.0)
Hemoglobin: 8.3 g/dL — ABNORMAL LOW (ref 12.0–15.0)
MCH: 28.1 pg (ref 26.0–34.0)
MCHC: 31.8 g/dL (ref 30.0–36.0)
MCV: 88.5 fL (ref 78.0–100.0)
Platelets: 159 10*3/uL (ref 150–400)
RBC: 2.95 MIL/uL — ABNORMAL LOW (ref 3.87–5.11)
RDW: 13.9 % (ref 11.5–15.5)
WBC: 4.9 10*3/uL (ref 4.0–10.5)

## 2017-06-30 LAB — RENAL FUNCTION PANEL
Albumin: 2.9 g/dL — ABNORMAL LOW (ref 3.5–5.0)
Anion gap: 10 (ref 5–15)
BUN: 45 mg/dL — AB (ref 6–20)
CHLORIDE: 103 mmol/L (ref 101–111)
CO2: 24 mmol/L (ref 22–32)
Calcium: 8.5 mg/dL — ABNORMAL LOW (ref 8.9–10.3)
Creatinine, Ser: 4.51 mg/dL — ABNORMAL HIGH (ref 0.44–1.00)
GFR calc Af Amer: 11 mL/min — ABNORMAL LOW (ref >60)
GFR calc non Af Amer: 10 mL/min — ABNORMAL LOW (ref >60)
GLUCOSE: 112 mg/dL — AB (ref 65–99)
POTASSIUM: 3.7 mmol/L (ref 3.5–5.1)
Phosphorus: 3.4 mg/dL (ref 2.5–4.6)
Sodium: 137 mmol/L (ref 135–145)

## 2017-06-30 LAB — HEPATITIS B CORE ANTIBODY, TOTAL: Hep B Core Total Ab: NEGATIVE

## 2017-06-30 MED ORDER — DARBEPOETIN ALFA 100 MCG/0.5ML IJ SOSY: 100.0000 ug | PREFILLED_SYRINGE | INTRAMUSCULAR | Status: DC

## 2017-06-30 MED ORDER — HEPARIN SODIUM (PORCINE) 1000 UNIT/ML DIALYSIS
1000.0000 [IU] | INTRAMUSCULAR | Status: DC | PRN
Start: 2017-06-30 — End: 2017-07-06
  Administered 2017-06-30: 1000 [IU] via INTRAVENOUS_CENTRAL

## 2017-06-30 MED ORDER — HEPARIN SODIUM (PORCINE) 1000 UNIT/ML DIALYSIS
20.0000 [IU]/kg | INTRAMUSCULAR | Status: DC | PRN
  Administered 2017-06-30: 1900 [IU] via INTRAVENOUS_CENTRAL

## 2017-06-30 NOTE — Progress Notes (Signed)
HD tx initiated via HD cath w/o problem, pull/push/flush equally w/o problem, VSS, will cont to monitor while on HD tx 

## 2017-06-30 NOTE — Evaluation (Signed)
Occupational Therapy Evaluation Patient Details Name: Jenna [name] MRN: [id number] DOB: Jan 23, 1956 Today's Date: 06/30/2017    History of Present Illness Pt is a 62 y/o female admitted secondary to dizziness, fatigue, and hypotension. Found to have ESRD and is s/p R IJ HD catheter placement. Pt also began HD during admission. PMH includes HTN and lupus.    Clinical Impression   This 62 y/o female presents with the above. At baseline pt is independent with ADLs and functional mobility, lives alone. Pt presents with decreased activity tolerance and generalized weakness, demonstrating increased fatigue with minimal activity. Pt currently requires MinA for sit<>stand from recliner, reporting feeling too fatigued to attempt mobility during this session, though able to complete x3 sit<>stand from recliner with seated rest breaks throughout; currently requires ModA for LB ADLs. Began education regarding energy conservation during ADL and functional task completion after return home. Pt's daughter present and reports plans to provide 24hr supervision/assist initially after discharge. Pt will benefit from continued acute OT services and recommend additional [place] services after discharge to maximize her overall safety and independence with ADLs and mobility.     Follow Up Recommendations  Home health OT;Supervision/Assistance - 24 hour    Equipment Recommendations  3 in 1 bedside commode;Tub/shower bench           Precautions / Restrictions Precautions Precautions: Fall Restrictions Weight Bearing Restrictions: No      Mobility Bed Mobility               General bed mobility comments: Sitting in chair upon entry.   Transfers Overall transfer level: Needs assistance Equipment used: Rolling walker (2 wheeled) Transfers: Sit to/from Stand Sit to Stand: Min guard;Min assist         General transfer comment: Pt initiatlly completing sit<>stand with [place] for safety; with  increased fatigue requires Min HHA for increased support. Performed sit<>stand x3 from recliner during session    Balance Overall balance assessment: Needs assistance Sitting-balance support: Feet supported;No upper extremity supported Sitting balance-Leahy Scale: Good     Standing balance support: Single extremity supported;No upper extremity supported Standing balance-Leahy Scale: Fair Standing balance comment: able to maintain static standing without UE support; reliant on UE support with increased fatigue                            ADL either performed or assessed with clinical judgement   ADL Overall ADL's : Needs assistance/impaired Eating/Feeding: Modified independent;Sitting   Grooming: Set up;Sitting   Upper Body Bathing: Min guard;Sitting   Lower Body Bathing: Minimal assistance;Sit to/from stand   Upper Body Dressing : Sitting;Set up Upper Body Dressing Details (indicate cue type and reason): donning housecoat  Lower Body Dressing: Moderate assistance;Sit to/from stand Lower Body Dressing Details (indicate cue type and reason): pt reports she was able to don L sock this AM, needed assist for R sock Toilet Transfer: Minimal assistance;Stand-pivot;BSC Toilet Transfer Details (indicate cue type and reason): simulated in transfer to/from recliner  Toileting- Clothing Manipulation and Hygiene: Minimal assistance;Sit to/from Nurse, children's Details (indicate cue type and reason): discussed option of tub bench as pt reports difficulty stepping over tub ledge; verbally reviewed transfer technique Functional mobility during ADLs: Minimal assistance General ADL Comments: pt completed sit<>stand x3 from recliner, though did not attempt further mobility as pt reports feeling too fatigued/weak to attempt and reports increased dizziness after 2nd sit<>stand (3rd sit<>stand completed for positioning  purposes); began education regarding energy conservation  during functional task completion                         Pertinent Vitals/Pain Pain Assessment: No/denies pain     Hand Dominance Right   Extremity/Trunk Assessment Upper Extremity Assessment Upper Extremity Assessment: Generalized weakness   Lower Extremity Assessment Lower Extremity Assessment: Defer to PT evaluation   Cervical / Trunk Assessment Cervical / Trunk Assessment: Normal   Communication Communication Communication: No difficulties   Cognition Arousal/Alertness: Awake/alert Behavior During Therapy: WFL for tasks assessed/performed Overall Cognitive Status: Within Functional Limits for tasks assessed                                                      Home Living Family/patient expects to be discharged to:: Private residence Living Arrangements: Alone Available Help at Discharge: Family;Available 24 hours/day Type of Home: Apartment Home Access: Stairs to enter [employer] of Steps: flight  Entrance Stairs-Rails: Right;Left;Can reach both Home Layout: One level     Bathroom Shower/Tub: Teacher, early years/pre: Standard     Home Equipment: Cane - single point          Prior Functioning/Environment Level of Independence: Independent with assistive device(s)        Comments: Using cane for mobility for past couple of days         OT Problem List: Decreased strength;Impaired balance (sitting and/or standing);Decreased activity tolerance;Decreased knowledge of use of DME or AE      OT Treatment/Interventions: Self-care/ADL training;DME and/or AE instruction;Therapeutic activities;Balance training;Therapeutic exercise;Patient/family education    OT Goals(Current goals can be found in the care plan section) Acute Rehab OT Goals Patient Stated Goal: to get better  OT Goal Formulation: With patient Time For Goal Achievement: 07/14/17 Potential to Achieve Goals: Good  OT Frequency: Min 2X/week                              AM-PAC PT "6 Clicks" Daily Activity     Outcome Measure Help from another person eating meals?: None Help from another person taking care of personal grooming?: A Little Help from another person toileting, which includes using toliet, bedpan, or urinal?: A Little Help from another person bathing (including washing, rinsing, drying)?: A Little Help from another person to put on and taking off regular upper body clothing?: None Help from another person to put on and taking off regular lower body clothing?: A Lot 6 Click Score: 19   End of Session Equipment Utilized During Treatment: Gait belt Nurse Communication: Mobility status  Activity Tolerance: Patient tolerated treatment well;Patient limited by fatigue Patient left: in chair;with call bell/phone within reach;with family/visitor present  OT Visit Diagnosis: Muscle weakness (generalized) (M62.81)                Time: 3235-5732 OT Time Calculation (min): 25 min Charges:  OT General Charges $OT Visit: 1 Visit OT Evaluation $OT Eval Moderate Complexity: 1 Mod OT Treatments $Self Care/Home Management : 8-22 mins G-Codes:     Lou Cal, OT Pager [phone] 06/30/2017  Raymondo Band 06/30/2017, 4:02 PM

## 2017-06-30 NOTE — Progress Notes (Signed)
Subjective:  First HD yest- tolerated well- removed 500 ccs- due for second tx today - appreciate [facility]- planning for access on Mon or Tuesday.  Cont to be very anxious   Objective Vital signs in last 24 hours: Vitals:   06/29/17 2111 06/30/17 0500 06/30/17 0525 06/30/17 0812  BP: (!) 142/73  111/69 120/62  Pulse: 60  61 (!) 57  Resp:   18 18  Temp:   98.2 F (36.8 C)   TempSrc:   Oral   SpO2:   100% 99%  Weight:  96.2 kg (212 lb)    Height:       Weight change: -3.5 kg (-7 lb 11.5 oz)  Intake/Output Summary (Last 24 hours) at 06/30/2017 1126 Last data filed at 06/30/2017 0930 Gross per 24 hour  Intake 950 ml  Output 494 ml  Net 456 ml    Assessment/Recommendations  1. 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 Progressive past year, Stage 5 pretty much since 02/2017. Clinically uremic. Needs to start dialysis.  1. S/p  [facility] 5/14, first HD 5/15, second todday 2. CLIP (I think will CLIP to [facility]) 3. Vmap done- have consulted [facility]- for access Mon or Tuesday 2. Anemia - Fe studies ordered- sat 270- start ferrlecit, start Aranesp (100 mcg ordered [place] for 5/13) 3. Secondary HPT - hypercalcemic at 10.8. Stop outpt calcitriol and repeat PTH. Check phos- wait on binder for right now- want to see how HD will impact- today is 4.7 4. Vascular access - vein mapping for AVF. TDC to start HD- [facility] called. Access Mon or Tuesday  5. SLE - continue plaquenil 6. Hypertension - with symptomatic orthostasis and significant bradycardia. Cut atenolol and now stopped amlodipine- Monitor orthostatics- min volume removal with HD at first     Jenna [name] A    Labs: Basic Metabolic Panel: Recent Labs  Lab 06/27/17 1553 06/28/17 0629 06/29/17 0926  NA 135 139 138  K 5.4* 4.8 4.5  CL 103 106 107  CO2 22 21* 22  GLUCOSE 111* 88 89  BUN 81* 83* 71*  CREATININE 5.49* 5.65* 4.94*  CALCIUM 10.8* 10.0 9.4  PHOS  --  6.4* 4.7*   Liver Function Tests: Recent Labs  Lab 06/28/17 0629  06/29/17 0926  ALBUMIN 2.9* 2.9*   No results for input(s): LIPASE, AMYLASE in the last 168 hours. No results for input(s): AMMONIA in the last 168 hours. CBC: Recent Labs  Lab 06/23/17 1600 06/27/17 1553 06/28/17 0629 06/29/17 0925  WBC 4.5 4.3 4.7 4.3  HGB 9.2* 9.3* 8.6* 8.2*  HCT 28.4* 29.3* 27.0* 26.5*  MCV 87.9 88.3 87.1 89.2  PLT 198 195 172 172   Cardiac Enzymes: No results for input(s): CKTOTAL, CKMB, CKMBINDEX, TROPONINI in the last 168 hours. CBG: No results for input(s): GLUCAP in the last 168 hours.  Iron Studies:  Recent Labs    06/28/17 0629  IRON 48  TIBC 175*   Studies/Results: Ir Fluoro Guide Cv Line Right  Result Date: 06/28/2017 INDICATION: END-STAGE RENAL DISEASE, NO CURRENT ACCESS FOR DIALYSIS EXAM: ULTRASOUND GUIDANCE FOR VASCULAR ACCESS RIGHT INTERNAL JUGULAR PERMANENT HEMODIALYSIS CATHETER Date:  06/28/2017 06/28/2017 3:21 pm Radiologist:  Judie Petit. Ruel Favors, MD Guidance:  Ultrasound and fluoroscopic FLUOROSCOPY TIME:  Fluoroscopy Time:  18 seconds (1 mGy). MEDICATIONS: Vancomycin 1 g image are within 1 of the procedure ANESTHESIA/SEDATION: Versed 1.5 mg IV; Fentanyl 70 mcg IV; Moderate Sedation Time:  13 minutes The patient was continuously monitored during the procedure  by the interventional radiology nurse under my direct supervision. CONTRAST:  None. COMPLICATIONS: None immediate. PROCEDURE: Informed consent was obtained from the patient following explanation of the procedure, risks, benefits and alternatives. The patient understands, agrees and consents for the procedure. All questions were addressed. A time out was performed. Maximal barrier sterile technique utilized including caps, mask, sterile gowns, sterile gloves, large sterile drape, hand hygiene, and 2% chlorhexidine scrub. Under sterile conditions and local anesthesia, right internal jugular micropuncture venous access was performed with ultrasound. Images were obtained for documentation of the  patent right internal jugular vein. A guide wire was inserted followed by a transitional dilator. Next, a 0.035 guidewire was advanced into the IVC with a 5-French catheter. Measurements were obtained from the right venotomy site to the proximal right atrium. In the right infraclavicular chest, a subcutaneous tunnel was created under sterile conditions and local anesthesia. 1% lidocaine with epinephrine was utilized for this. The 19 cm tip to cuff palindrome catheter was tunneled subcutaneously to the venotomy site and inserted into the SVC/RA junction through a valved peel-away sheath. Position was confirmed with fluoroscopy. Images were obtained for documentation. Blood was aspirated from the catheter followed by saline and heparin flushes. The appropriate volume and strength of heparin was instilled in each lumen. Caps were applied. The catheter was secured at the tunnel site with Gelfoam and a pursestring suture. The venotomy site was closed with subcuticular Vicryl suture. Dermabond was applied to the small right neck incision. A dry sterile dressing was applied. The catheter is ready for use. No immediate complications. IMPRESSION: Ultrasound and fluoroscopically guided right internal jugular tunneled hemodialysis catheter (19 cm tip to cuff palindrome catheter). Electronically Signed   By: Jerilynn Mages.  Shick M.D.   On: 06/28/2017 15:23   Ir US Guide Vasc Access Right  Result Date: 06/28/2017 INDICATION: END-STAGE RENAL DISEASE, NO CURRENT ACCESS FOR DIALYSIS EXAM: ULTRASOUND GUIDANCE FOR VASCULAR ACCESS RIGHT INTERNAL JUGULAR PERMANENT HEMODIALYSIS CATHETER Date:  06/28/2017 06/28/2017 3:21 pm Radiologist:  Jerilynn Mages. Daryll Brod, MD Guidance:  Ultrasound and fluoroscopic FLUOROSCOPY TIME:  Fluoroscopy Time:  18 seconds (1 mGy). MEDICATIONS: Vancomycin 1 g image are within 1 of the procedure ANESTHESIA/SEDATION: Versed 1.5 mg IV; Fentanyl 70 mcg IV; Moderate Sedation Time:  13 minutes The patient was continuously monitored  during the procedure by the interventional radiology nurse under my direct supervision. CONTRAST:  None. COMPLICATIONS: None immediate. PROCEDURE: Informed consent was obtained from the patient following explanation of the procedure, risks, benefits and alternatives. The patient understands, agrees and consents for the procedure. All questions were addressed. A time out was performed. Maximal barrier sterile technique utilized including caps, mask, sterile gowns, sterile gloves, large sterile drape, hand hygiene, and 2% chlorhexidine scrub. Under sterile conditions and local anesthesia, right internal jugular micropuncture venous access was performed with ultrasound. Images were obtained for documentation of the patent right internal jugular vein. A guide wire was inserted followed by a transitional dilator. Next, a 0.035 guidewire was advanced into the IVC with a 5-French catheter. Measurements were obtained from the right venotomy site to the proximal right atrium. In the right infraclavicular chest, a subcutaneous tunnel was created under sterile conditions and local anesthesia. 1% lidocaine with epinephrine was utilized for this. The 19 cm tip to cuff palindrome catheter was tunneled subcutaneously to the venotomy site and inserted into the SVC/RA junction through a valved peel-away sheath. Position was confirmed with fluoroscopy. Images were obtained for documentation. Blood was aspirated from the catheter followed by  saline and heparin flushes. The appropriate volume and strength of heparin was instilled in each lumen. Caps were applied. The catheter was secured at the tunnel site with Gelfoam and a pursestring suture. The venotomy site was closed with subcuticular Vicryl suture. Dermabond was applied to the small right neck incision. A dry sterile dressing was applied. The catheter is ready for use. No immediate complications. IMPRESSION: Ultrasound and fluoroscopically guided right internal jugular tunneled  hemodialysis catheter (19 cm tip to cuff palindrome catheter). Electronically Signed   By: Jerilynn Mages.  Shick M.D.   On: 06/28/2017 15:23   Medications: Infusions: . sodium chloride    . ferric gluconate (FERRLECIT/NULECIT) IV Stopped (06/29/17 1153)    Scheduled Medications: . amLODipine  5 mg Oral QHS  . atenolol  50 mg Oral QHS  . feeding supplement (NEPRO CARB STEADY)  237 mL Oral BID BM  . heparin  5,000 Units Subcutaneous Q8H  . hydroxychloroquine  200 mg Oral BID  . sodium chloride flush  3 mL Intravenous Q12H    have reviewed scheduled and prn medications.  Physical Exam: General: obese, talkative, NAD Heart: RRR0 HR is better Lungs: mostly clear Abdomen: obese, soft Extremities: legs are obese- possibly just a little edema- slightly tender     06/30/2017,11:26 AM  LOS: 3 days

## 2017-06-30 NOTE — Progress Notes (Signed)
HD tx completed @ 2155 w/o problem, UF goal met, blood rinsed back, VSS, report called to Laurin Coder, RN

## 2017-06-30 NOTE — Progress Notes (Signed)
Physical Therapy Treatment Patient Details Name: Jenna [name] MRN: [id number] DOB: 16-Nov-1955 Today's Date: 06/30/2017    History of Present Illness Pt is a 62 y/o female admitted secondary to dizziness, fatigue, and hypotension. Found to have ESRD and is s/p R IJ HD catheter placement. Pt also began HD during admission. PMH includes HTN and lupus.     PT Comments    Patient not demonstrating significant progression with therapy secondary to decreased endurance/activity tolerance, although still very motivated to participate. Session focused on seated therapeutic exercises with emphasis on increased repetitions to promote endurance. Attempted to progress ambulation, but patient only able to walk ~2 feet before requiring seated break secondary to dizziness (BP 96/64, HR 46 bpm). Discussed short term rehab with patient and patient daughter at length. They agree that patient will not be able to negotiate 4 steps to enter their home at this time and are agreeable to rehab prior to patient moving in with her daughter into a level entry apartment on June 3.   Follow Up Recommendations  SNF     Equipment Recommendations  Other (comment)(rollator and tub bench)    Recommendations for Other Services       Precautions / Restrictions Precautions Precautions: Fall Restrictions Weight Bearing Restrictions: No    Mobility  Bed Mobility               General bed mobility comments: Sitting in chair upon entry.   Transfers Overall transfer level: Needs assistance Equipment used: Rolling walker (2 wheeled) Transfers: Sit to/from Stand Sit to Stand: Min guard         General transfer comment: Min guard for safety. VC's for hand placement  Ambulation/Gait Ambulation/Gait assistance: Min guard Ambulation Distance (Feet): 2 Feet Assistive device: Rolling walker (2 wheeled) Gait Pattern/deviations: Step-through pattern;Decreased stride length;Trunk flexed Gait velocity: Decreased     General Gait Details: Patient able ambulate ~2 feet forward before complaint of lightheadedness and requiring seated break. BP 96/64   Stairs             Wheelchair Mobility    Modified Rankin (Stroke Patients Only)       Balance Overall balance assessment: Needs assistance Sitting-balance support: Feet supported;No upper extremity supported Sitting balance-Leahy Scale: Good     Standing balance support: Bilateral upper extremity supported Standing balance-Leahy Scale: Fair Standing balance comment: able to maintain static standing without UE support; reliant on UE support with increased fatigue                             Cognition Arousal/Alertness: Awake/alert Behavior During Therapy: WFL for tasks assessed/performed Overall Cognitive Status: Within Functional Limits for tasks assessed                                        Exercises General Exercises - Lower Extremity Long Arc Quad: Other reps (comment);Both;Seated(40) Hip ABduction/ADduction: Other (comment);20 reps;Both(isometric) Hip Flexion/Marching: 20 reps;Both;Seated Heel Raises: Seated;Other reps (comment);Both(30)    General Comments        Pertinent Vitals/Pain Pain Assessment: No/denies pain    Home Living Family/patient expects to be discharged to:: Private residence Living Arrangements: Alone Available Help at Discharge: Family;Available 24 hours/day Type of Home: Apartment Home Access: Stairs to enter Entrance Stairs-Rails: Right;Left;Can reach both Home Layout: One level Home Equipment: Cane - single point  Prior Function Level of Independence: Independent with assistive device(s)      Comments: Using cane for mobility for past couple of days    PT Goals (current goals can now be found in the care plan section) Acute Rehab PT Goals Patient Stated Goal: to get better  PT Goal Formulation: With patient Time For Goal Achievement:  07/13/17 Potential to Achieve Goals: Good Progress towards PT goals: Progressing toward goals    Frequency    Min 2X/week      PT Plan Frequency needs to be updated;Discharge plan needs to be updated    Co-evaluation              AM-PAC PT "6 Clicks" Daily Activity  Outcome Measure  Difficulty turning over in bed (including adjusting bedclothes, sheets and blankets)?: A Little Difficulty moving from lying on back to sitting on the side of the bed? : Unable Difficulty sitting down on and standing up from a chair with arms (e.g., wheelchair, bedside commode, etc,.)?: A Little Help needed moving to and from a bed to chair (including a wheelchair)?: A Little Help needed walking in hospital room?: A Lot Help needed climbing 3-5 steps with a railing? : Total 6 Click Score: 13    End of Session Equipment Utilized During Treatment: Gait belt Activity Tolerance: Patient limited by fatigue Patient left: in chair;with call bell/phone within reach;with family/visitor present Nurse Communication: Mobility status PT Visit Diagnosis: Unsteadiness on feet (R26.81);Other abnormalities of gait and mobility (R26.89);Difficulty in walking, not elsewhere classified (R26.2);Muscle weakness (generalized) (M62.81);Dizziness and giddiness (R42)     Time: 4076-8088 PT Time Calculation (min) (ACUTE ONLY): 37 min  Charges:  $Therapeutic Exercise: 8-22 mins $Therapeutic Activity: 8-22 mins                    G Codes:       Ellamae Sia, PT, DPT Acute Rehabilitation Services  Pager: [phone]    Willy Eddy 06/30/2017, 4:47 PM

## 2017-06-30 NOTE — Progress Notes (Signed)
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 Her primary nephrologist Dr. Florene Glen has been managing her renal function but over the last month she's had progressive shortness of breath with exertion. Associated with weakness. Patient has also noted some lower extremity edema, she was seen by the nephrologist in the office and sent here to prepare for dialysis commencement.  Reason for Visit: End-stage renal disease  Consultants: Nephrology.  Vascular surgery.  Procedures:  Tunneled right IJ dialysis catheter  Hemodialysis  Antibiotics: None  Subjective/Interval History: Patient feels well this morning.  Denies any shortness of breath.  Tolerated her dialysis well yesterday.  Patient daughter is at the bedside.  ROS: Denies any nausea or vomiting.  Objective:  Vital Signs  Vitals:   06/29/17 2111 06/30/17 0500 06/30/17 0525 06/30/17 0812  BP: (!) 142/73  111/69 120/62  Pulse: 60  61 (!) 57  Resp:   18 18  Temp:   98.2 F (36.8 C)   TempSrc:   Oral   SpO2:   100% 99%  Weight:  96.2 kg (212 lb)    Height:        Intake/Output Summary (Last 24 hours) at 06/30/2017 0901 Last data filed at 06/30/2017 0600 Gross per 24 hour  Intake 710 ml  Output 494 ml  Net 216 ml   Filed Weights   06/29/17 0830 06/29/17 1140 06/30/17 0500  Weight: 97.6 kg (215 lb 2.7 oz) 97 kg (213 lb 13.5 oz) 96.2 kg (212 lb)    General appearance: Awake alert.  In no distress Resp: Clear to auscultation bilaterally.  No wheezing rales or rhonchi.  Normal effort. Cardio: S1-S2 is normal regular.  No S3-S4.  No rubs murmurs or bruit.  pedal edema + GI: Abdomen is soft.   Nontender nondistended.  Bowel sounds are present.  No masses organomegaly. Extremities: Mild lower extremity edema is noted. Neurological: Oriented x3.  No obvious focal neurological deficits.  Lab Results:  Data Reviewed: I have personally reviewed following labs and imaging studies  CBC: Recent Labs  Lab 06/23/17 1600 06/27/17 1553 06/28/17 0629 06/29/17 0925  WBC 4.5 4.3 4.7 4.3  HGB 9.2* 9.3* 8.6* 8.2*  HCT 28.4* 29.3* 27.0* 26.5*  MCV 87.9 88.3 87.1 89.2  PLT 198 195 172 403    Basic Metabolic Panel: Recent Labs  Lab 06/23/17 1600 06/27/17 1553 06/28/17 0629 06/29/17 0926  NA 132* 135 139 138  K 5.8* 5.4* 4.8 4.5  CL 101 103 106 107  CO2 21* 22 21* 22  GLUCOSE 97 111* 88 89  BUN 88* 81* 83* 71*  CREATININE 5.70* 5.49* 5.65* 4.94*  CALCIUM 9.9 10.8* 10.0 9.4  PHOS  --   --  6.4* 4.7*    GFR: Estimated Creatinine Clearance: 14.4 mL/min (A) (by C-G formula based on SCr of 4.94 mg/dL (H)).  Liver Function Tests: Recent Labs  Lab 06/28/17 0629 06/29/17 0926  ALBUMIN 2.9* 2.9*    Coagulation Profile: Recent Labs  Lab 06/28/17 0629  INR 1.09   Anemia Panel: Recent Labs    06/28/17 0629  TIBC 175*  IRON 48    Radiology Studies: Ir Fluoro Guide Cv Line Right  Result Date: 06/28/2017 INDICATION: END-STAGE RENAL  DISEASE, NO CURRENT ACCESS FOR DIALYSIS EXAM: ULTRASOUND GUIDANCE FOR VASCULAR ACCESS RIGHT INTERNAL JUGULAR PERMANENT HEMODIALYSIS CATHETER Date:  06/28/2017 06/28/2017 3:21 pm Radiologist:  M. Ruel Favors, MD Guidance:  Ultrasound and fluoroscopic FLUOROSCOPY TIME:  Fluoroscopy Time:  18 seconds (1 mGy). MEDICATIONS: Vancomycin 1 g image are within 1 of the procedure ANESTHESIA/SEDATION: Versed 1.5 mg IV; Fentanyl 70 mcg IV; Moderate Sedation Time:  13 minutes The patient was continuously monitored during the procedure by the interventional radiology nurse under my direct supervision. CONTRAST:  None. COMPLICATIONS: None immediate. PROCEDURE:  Informed consent was obtained from the patient following explanation of the procedure, risks, benefits and alternatives. The patient understands, agrees and consents for the procedure. All questions were addressed. A time out was performed. Maximal barrier sterile technique utilized including caps, mask, sterile gowns, sterile gloves, large sterile drape, hand hygiene, and 2% chlorhexidine scrub. Under sterile conditions and local anesthesia, right internal jugular micropuncture venous access was performed with ultrasound. Images were obtained for documentation of the patent right internal jugular vein. A guide wire was inserted followed by a transitional dilator. Next, a 0.035 guidewire was advanced into the IVC with a 5-French catheter. Measurements were obtained from the right venotomy site to the proximal right atrium. In the right infraclavicular chest, a subcutaneous tunnel was created under sterile conditions and local anesthesia. 1% lidocaine with epinephrine was utilized for this. The 19 cm tip to cuff palindrome catheter was tunneled subcutaneously to the venotomy site and inserted into the SVC/RA junction through a valved peel-away sheath. Position was confirmed with fluoroscopy. Images were obtained for documentation. Blood was aspirated from the catheter followed by saline and heparin flushes. The appropriate volume and strength of heparin was instilled in each lumen. Caps were applied. The catheter was secured at the tunnel site with Gelfoam and a pursestring suture. The venotomy site was closed with subcuticular Vicryl suture. Dermabond was applied to the small right neck incision. A dry sterile dressing was applied. The catheter is ready for use. No immediate complications. IMPRESSION: Ultrasound and fluoroscopically guided right internal jugular tunneled hemodialysis catheter (19 cm tip to cuff palindrome catheter). Electronically Signed   By: Judie Petit.  Shick M.D.   On: 06/28/2017 15:23   Ir US Guide  Vasc Access Right  Result Date: 06/28/2017 INDICATION: END-STAGE RENAL DISEASE, NO CURRENT ACCESS FOR DIALYSIS EXAM: ULTRASOUND GUIDANCE FOR VASCULAR ACCESS RIGHT INTERNAL JUGULAR PERMANENT HEMODIALYSIS CATHETER Date:  06/28/2017 06/28/2017 3:21 pm Radiologist:  Judie Petit. Ruel Favors, MD Guidance:  Ultrasound and fluoroscopic FLUOROSCOPY TIME:  Fluoroscopy Time:  18 seconds (1 mGy). MEDICATIONS: Vancomycin 1 g image are within 1 of the procedure ANESTHESIA/SEDATION: Versed 1.5 mg IV; Fentanyl 70 mcg IV; Moderate Sedation Time:  13 minutes The patient was continuously monitored during the procedure by the interventional radiology nurse under my direct supervision. CONTRAST:  None. COMPLICATIONS: None immediate. PROCEDURE: Informed consent was obtained from the patient following explanation of the procedure, risks, benefits and alternatives. The patient understands, agrees and consents for the procedure. All questions were addressed. A time out was performed. Maximal barrier sterile technique utilized including caps, mask, sterile gowns, sterile gloves, large sterile drape, hand hygiene, and 2% chlorhexidine scrub. Under sterile conditions and local anesthesia, right internal jugular micropuncture venous access was performed with ultrasound. Images were obtained for documentation of the patent right internal jugular vein. A guide wire was inserted followed by a transitional dilator. Next, a 0.035 guidewire was advanced into the IVC with a 5-French catheter. Measurements were  obtained from the right venotomy site to the proximal right atrium. In the right infraclavicular chest, a subcutaneous tunnel was created under sterile conditions and local anesthesia. 1% lidocaine with epinephrine was utilized for this. The 19 cm tip to cuff palindrome catheter was tunneled subcutaneously to the venotomy site and inserted into the SVC/RA junction through a valved peel-away sheath. Position was confirmed with fluoroscopy. Images were  obtained for documentation. Blood was aspirated from the catheter followed by saline and heparin flushes. The appropriate volume and strength of heparin was instilled in each lumen. Caps were applied. The catheter was secured at the tunnel site with Gelfoam and a pursestring suture. The venotomy site was closed with subcuticular Vicryl suture. Dermabond was applied to the small right neck incision. A dry sterile dressing was applied. The catheter is ready for use. No immediate complications. IMPRESSION: Ultrasound and fluoroscopically guided right internal jugular tunneled hemodialysis catheter (19 cm tip to cuff palindrome catheter). Electronically Signed   By: Jerilynn Mages.  Shick M.D.   On: 06/28/2017 15:23     Medications:  Scheduled: . amLODipine  5 mg Oral QHS  . atenolol  50 mg Oral QHS  . feeding supplement (NEPRO CARB STEADY)  237 mL Oral BID BM  . heparin  5,000 Units Subcutaneous Q8H  . hydroxychloroquine  200 mg Oral BID  . sodium chloride flush  3 mL Intravenous Q12H   Continuous: . sodium chloride    . ferric gluconate (FERRLECIT/NULECIT) IV Stopped (06/29/17 1153)   JEH:UDJSHF chloride, acetaminophen **OR** acetaminophen, calcium carbonate (dosed in mg elemental calcium), camphor-menthol **AND** hydrOXYzine, docusate sodium, HYDROcodone-acetaminophen, ondansetron **OR** ondansetron (ZOFRAN) IV, sodium chloride flush, sorbitol, zolpidem  Assessment/Plan:  Principal Problem:   End stage renal disease ([name]) Active Problems:   Essential hypertension   Hx of lupus nephritis    History of chronic kidney disease stage V now in End-Stage Renal Disease  Patient with worsening symptoms over the last 1 month including shortness of breath and fatigue.  She is thought to have progressed to end-stage renal disease.  Patient has CKD due to underlying FSGS/lupus nephritis.  Tunneled dialysis catheter was placed 5/14.  Patient was started on dialysis with first session on 5/15.  Nephrology is  following.  Vascular surgery was consulted for permanent access.   History of SLE Stable on Plaquenil. Continue.  Essential hypertension Continue home dose beta-blocker and Norvasc. ARB on hold currently.  Anemia of chronic disease Stable no acute issues.  No overt bleeding.  DVT Prophylaxis: Subcutaneous heparin    Code Status: Full code Family Communication: Discussed with the patient and her daughter Disposition Plan: PT OT.  Mobilize.  Otherwise management as outlined above.  Will need to be established with outpatient dialysis center.    LOS: 3 days   [facility] Pager [phone] 06/30/2017, 9:01 AM  If 7PM-7AM, please contact night-coverage at [email or web address], password [email or web address]

## 2017-07-01 ENCOUNTER — Ambulatory Visit: Payer: Medicare Other | Admitting: Family Medicine

## 2017-07-01 DIAGNOSIS — R110 Nausea: Secondary | ICD-10-CM

## 2017-07-01 MED ORDER — POLYETHYLENE GLYCOL 3350 17 G PO PACK
17.0000 g | PACK | Freq: Every day | ORAL | Status: DC
  Administered 2017-07-01: 17 g via ORAL
  Filled 2017-07-01: qty 1

## 2017-07-01 MED ORDER — ATENOLOL 25 MG PO TABS: 25.0000 mg | ORAL_TABLET | Freq: Every day | ORAL | Status: DC

## 2017-07-01 NOTE — Care Management Important Message (Signed)
Important Message  Patient Details  Name: Jenna [name] MRN: [id number] Date of Birth: [birth year]   Medicare Important Message Given:  Yes    Orbie Pyo 07/01/2017, 2:02 PM

## 2017-07-01 NOTE — Progress Notes (Signed)
Occupational Therapy Treatment Patient Details Name: Jenna [name] MRN: [id number] DOB: [birth year] Today's Date: 07/01/2017    History of present illness Pt is a 62 y/o female admitted secondary to dizziness, fatigue, and hypotension. Found to have ESRD and is s/p R IJ HD catheter placement. Pt also began HD during admission. PMH includes HTN and lupus.    OT comments  This 62 yo female admitted with above presents to acute OT making progress with sit<>stand and LBADLs. Needing rest breaks with even minimal activity (sit<>stand exercises and ambulating to door). She will continue to benefit from acute OT with follow up now at [facility].  Follow Up Recommendations  SNF;Supervision/Assistance - 24 hour    Equipment Recommendations  3 in 1 bedside commode;Tub/shower bench       Precautions / Restrictions Precautions Precautions: Fall Restrictions Weight Bearing Restrictions: No       Mobility Bed Mobility Overal bed mobility: Needs Assistance Bed Mobility: Sit to Supine       Sit to supine: Supervision      Transfers Overall transfer level: Needs assistance Equipment used: Rolling walker (2 wheeled) Transfers: Sit to/from Stand Sit to Stand: Min guard         General transfer comment: Min guard for safety. VC's for hand placement. Pt ambulated to door and back to bed (requiring 2 standing rest breaks--pt reports just weak)    Balance Overall balance assessment: Needs assistance Sitting-balance support: No upper extremity supported Sitting balance-Leahy Scale: Good     Standing balance support: Bilateral upper extremity supported Standing balance-Leahy Scale: Poor                             ADL either performed or assessed with clinical judgement   ADL Overall ADL's : Needs assistance/impaired                     Lower Body Dressing: Min guard;Sit to/from stand Lower Body Dressing Details (indicate cue type and reason): Pt able to cross one  leg over other to doff and donn her socks Toilet Transfer: Min guard;Ambulation;RW Toilet Transfer Details (indicate cue type and reason): recliner>around to door>to bed                 Vision Patient Visual Report: No change from baseline            Cognition Arousal/Alertness: Awake/alert Behavior During Therapy: WFL for tasks assessed/performed Overall Cognitive Status: Within Functional Limits for tasks assessed                                          Exercises Other Exercises Other Exercises: Pt did 5 sit<>stand reps from recliner with focus on going to sit from stand and holding for a count of 5 relying more on her legs than her arms. Pt able to do 3 then needed to rest before completing the other 2.           Pertinent Vitals/ Pain       Pain Assessment: No/denies pain     Prior Functioning/Environment              Frequency  Min 2X/week        Progress Toward Goals  OT Goals(current goals can now be found in the care plan section)  Progress towards OT goals:  Progressing toward goals     Plan Discharge plan needs to be updated       AM-PAC PT "6 Clicks" Daily Activity     Outcome Measure   Help from another person eating meals?: None Help from another person taking care of personal grooming?: A Little Help from another person toileting, which includes using toliet, bedpan, or urinal?: A Little Help from another person bathing (including washing, rinsing, drying)?: A Little Help from another person to put on and taking off regular upper body clothing?: A Little Help from another person to put on and taking off regular lower body clothing?: A Little 6 Click Score: 19    End of Session Equipment Utilized During Treatment: Rolling walker  OT Visit Diagnosis: Muscle weakness (generalized) (M62.81)   Activity Tolerance Patient limited by fatigue   Patient Left in bed;with call bell/phone within reach;with bed alarm set;with  family/visitor present   Nurse Communication          Time: 1740-8144 OT Time Calculation (min): 22 min  Charges: OT General Charges $OT Visit: 1 Visit OT Treatments $Self Care/Home Management : 8-22 mins  [address], OTR/L [phone] 07/01/2017

## 2017-07-01 NOTE — Progress Notes (Signed)
OT Cancellation Note  Patient Details Name: [name] MRN: [id number] DOB: 12-26-1955   Cancelled Treatment:    Reason Eval/Treat Not Completed: Other (comment). Pt currently with other staff member in room. Will check back as schedule allows.  Almon Register [phone] 07/01/2017, 12:57 PM

## 2017-07-01 NOTE — Progress Notes (Signed)
In to speak with patient, NCM following up on home health list of providers, patient still has not selected a provider from the list.  NCM advised patient the sooner we know it will help [name] to be proactive regarding discharge planning.  Patient wants to research.

## 2017-07-01 NOTE — Progress Notes (Signed)
[facility] PROGRESS NOTE  Jenna [name] NLG:[id number] DOB: 06-06-1955 DOA: 06/27/2017  PCP: Rogers Blocker, MD  Brief History/Interval Summary: 62 y.o.femalewith medical history significant ofchronic kidney disease stage IV secondary to lupus nephritis and FSGS who is being followed in the outpatient setting and was being evaluated for possible kidney transplant. Her primary nephrologist Dr. Florene Glen has been managing her renal function but over the last month she's had progressive shortness of breath with exertion. Associated with weakness. Patient has also noted some lower extremity edema, she was seen by the nephrologist in the office and sent here to prepare for dialysis commencement.  Reason for Visit: End-stage renal disease  Consultants: Nephrology.  Vascular surgery.  Procedures:  Tunneled right IJ dialysis catheter  Hemodialysis  Antibiotics: None  Subjective/Interval History: Patient mentions that she developed some hallucinations yesterday after her dialysis treatment.  No further hallucinations this morning.  Feels a little nauseated however.    ROS: Denies any chest pain or shortness of breath.  Objective:  Vital Signs  Vitals:   06/30/17 2250 06/30/17 2305 07/01/17 0450 07/01/17 0451  BP: 117/72   109/60  Pulse: (!) 59   (!) 54  Resp: 16   16  Temp: 98.4 F (36.9 C)   98.6 F (37 C)  TempSrc: Oral   Oral  SpO2: 100%   98%  Weight:  96.7 kg (213 lb 1.6 oz) 96.7 kg (213 lb 3 oz)   Height:        Intake/Output Summary (Last 24 hours) at 07/01/2017 1004 Last data filed at 07/01/2017 0200 Gross per 24 hour  Intake 480 ml  Output 1000 ml  Net -520 ml   Filed Weights   06/30/17 2202 06/30/17 2305 07/01/17 0450  Weight: 97.4 kg (214 lb 11.7 oz) 96.7 kg (213 lb 1.6 oz) 96.7 kg (213 lb 3 oz)    General appearance: Awake alert.  In no distress. Resp: Clear to auscultation bilaterally.  No wheezing rales or rhonchi. Cardio: S1-S2 is normal  regular.  No S3-S4.  No rubs murmurs or bruit GI: Abdomen is soft.  Nontender nondistended.  Bowel sounds are present.  No masses or organomegaly Extremities: Mild lower extremity edema is noted. Neurological: Oriented x3.  No obvious focal neurological deficits.   Lab Results:  Data Reviewed: I have personally reviewed following labs and imaging studies  CBC: Recent Labs  Lab 06/27/17 1553 06/28/17 0629 06/29/17 0925 06/30/17 2019  WBC 4.3 4.7 4.3 4.9  HGB 9.3* 8.6* 8.2* 8.3*  HCT 29.3* 27.0* 26.5* 26.1*  MCV 88.3 87.1 89.2 88.5  PLT 195 172 172 081    Basic Metabolic Panel: Recent Labs  Lab 06/27/17 1553 06/28/17 0629 06/29/17 0926 06/30/17 2019  NA 135 139 138 137  K 5.4* 4.8 4.5 3.7  CL 103 106 107 103  CO2 22 21* 22 24  GLUCOSE 111* 88 89 112*  BUN 81* 83* 71* 45*  CREATININE 5.49* 5.65* 4.94* 4.51*  CALCIUM 10.8* 10.0 9.4 8.5*  PHOS  --  6.4* 4.7* 3.4    GFR: Estimated Creatinine Clearance: 15.8 mL/min (A) (by C-G formula based on SCr of 4.51 mg/dL (H)).  Liver Function Tests: Recent Labs  Lab 06/28/17 0629 06/29/17 0926 06/30/17 2019  ALBUMIN 2.9* 2.9* 2.9*    Coagulation Profile: Recent Labs  Lab 06/28/17 0629  INR 1.09    Radiology Studies: No results found.   Medications:  Scheduled: . atenolol  50 mg Oral QHS  . [  START ON 07/05/2017] darbepoetin (ARANESP) injection - DIALYSIS  100 mcg Intravenous Q Tue-HD  . feeding supplement (NEPRO CARB STEADY)  237 mL Oral BID BM  . heparin  5,000 Units Subcutaneous Q8H  . hydroxychloroquine  200 mg Oral BID  . polyethylene glycol  17 g Oral Daily  . sodium chloride flush  3 mL Intravenous Q12H   Continuous: . sodium chloride    . ferric gluconate (FERRLECIT/NULECIT) IV Stopped (06/30/17 2250)   URK:YHCWCB chloride, acetaminophen **OR** acetaminophen, calcium carbonate (dosed in mg elemental calcium), camphor-menthol **AND** hydrOXYzine, docusate sodium, heparin, heparin,  HYDROcodone-acetaminophen, ondansetron **OR** ondansetron (ZOFRAN) IV, sodium chloride flush, sorbitol, zolpidem  Assessment/Plan:  Principal Problem:   End stage renal disease ([place]) Active Problems:   Essential hypertension   Hx of lupus nephritis    History of chronic kidney disease stage V now in End-Stage Renal Disease  Patient with worsening symptoms over the last 1 month including shortness of breath and fatigue.  She is thought to have progressed to end-stage renal disease.  Patient has CKD due to underlying FSGS/lupus nephritis.  Tunneled dialysis catheter was placed 5/14.  Patient was started on dialysis with first session on 5/15.  Nephrology is following.  Vascular surgery was consulted for permanent access with plans to do on Monday.  Nausea Possibly related to dialysis.  Treat symptomatically for now.  Abdomen is benign.  History of SLE Stable on Plaquenil. Continue.  Essential hypertension Apparently patient developed some orthostatic symptoms.  Amlodipine discontinued by nephrology.  Dose of atenolol reduced.  ARB has been on hold since admission.    Anemia of chronic disease Stable no acute issues.  No overt bleeding.  DVT Prophylaxis: Subcutaneous heparin    Code Status: Full code Family Communication: Discussed with the patient Disposition Plan: Surgery to create fistula scheduled for Monday.  Will need to be established with outpatient dialysis center.  PT recommends skilled nursing facility for short-term rehab.    LOS: 4 days   [facility] Pager [phone] 07/01/2017, 10:04 AM  If 7PM-7AM, please contact night-coverage at [email or web address], password [facility]

## 2017-07-01 NOTE — Progress Notes (Signed)
Subjective:  Second HD yest- tolerated well- removed 1000 ccs-  - appreciate VVS- planning for access on Mon or Tuesday.  Cont to be very anxious, talkative with many small complaints  Objective Vital signs in last 24 hours: Vitals:   06/30/17 2305 07/01/17 0450 07/01/17 0451 07/01/17 1052  BP:   109/60 (!) 117/54  Pulse:   (!) 54 (!) 51  Resp:   16 18  Temp:   98.6 F (37 C) 98 F (36.7 C)  TempSrc:   Oral Oral  SpO2:   98% 100%  Weight: 96.7 kg (213 lb 1.6 oz) 96.7 kg (213 lb 3 oz)    Height:       Weight change: 0.8 kg (1 lb 12.2 oz)  Intake/Output Summary (Last 24 hours) at 07/01/2017 1246 Last data filed at 07/01/2017 0200 Gross per 24 hour  Intake 480 ml  Output 1000 ml  Net -520 ml    Assessment/Recommendations  1. 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 Progressive past year, Stage 5 pretty much since 02/2017. Clinically uremic. Needs to start dialysis.  1. S/p  [facility] 5/14, first HD 5/15, second 5/16- plan for third on 5/18 2. CLIP in progress (I think will CLIP to [facility]) 3. Vmap done- have consulted VVS- for access Mon or Tuesday 2. Anemia - Fe studies ordered- sat 27%- start ferrlecit, start Aranesp (100 mcg ordered [place] for 5/13) 3. Secondary HPT - hypercalcemic at 10.8. Stop outpt calcitriol - repeat PTH 45. Check phos- wait on binder for right now- want to see how HD will impact- today is 3.4 4. Vascular access - vein mapping for AVF. TDC to start HD- VVS called. Access Mon or Tuesday  5. SLE - continue plaquenil 6. Hypertension - with symptomatic orthostasis and significant bradycardia. Cut atenolol and now stopped amlodipine- HR still in the 50's dec atenolol to 25-  - min volume removal with HD at first     Ona: Basic Metabolic Panel: Recent Labs  Lab 06/28/17 0629 06/29/17 0926 06/30/17 2019  NA 139 138 137  K 4.8 4.5 3.7  CL 106 107 103  CO2 21* 22 24  GLUCOSE 88 89 112*  BUN 83* 71* 45*  CREATININE 5.65* 4.94*  4.51*  CALCIUM 10.0 9.4 8.5*  PHOS 6.4* 4.7* 3.4   Liver Function Tests: Recent Labs  Lab 06/28/17 0629 06/29/17 0926 06/30/17 2019  ALBUMIN 2.9* 2.9* 2.9*   No results for input(s): LIPASE, AMYLASE in the last 168 hours. No results for input(s): AMMONIA in the last 168 hours. CBC: Recent Labs  Lab 06/27/17 1553 06/28/17 0629 06/29/17 0925 06/30/17 2019  WBC 4.3 4.7 4.3 4.9  HGB 9.3* 8.6* 8.2* 8.3*  HCT 29.3* 27.0* 26.5* 26.1*  MCV 88.3 87.1 89.2 88.5  PLT 195 172 172 159   Cardiac Enzymes: No results for input(s): CKTOTAL, CKMB, CKMBINDEX, TROPONINI in the last 168 hours. CBG: No results for input(s): GLUCAP in the last 168 hours.  Iron Studies:  No results for input(s): IRON, TIBC, TRANSFERRIN, FERRITIN in the last 72 hours. Studies/Results: No results found. Medications: Infusions: . sodium chloride    . ferric gluconate (FERRLECIT/NULECIT) IV Stopped (06/30/17 2250)    Scheduled Medications: . atenolol  50 mg Oral QHS  . [START ON 07/05/2017] darbepoetin (ARANESP) injection - DIALYSIS  100 mcg Intravenous Q Tue-HD  . feeding supplement (NEPRO CARB STEADY)  237 mL Oral BID BM  . heparin  5,000  Units Subcutaneous Q8H  . hydroxychloroquine  200 mg Oral BID  . polyethylene glycol  17 g Oral Daily  . sodium chloride flush  3 mL Intravenous Q12H    have reviewed scheduled and prn medications.  Physical Exam: General: obese, talkative, NAD Heart: RRR0 HR is better Lungs: mostly clear Abdomen: obese, soft Extremities: legs are obese- possibly just a little edema- slightly tender  Right sided PC    07/01/2017,12:46 PM  LOS: 4 days

## 2017-07-01 NOTE — Progress Notes (Signed)
Pt scheduled for left arm AV fistula by my partned Dr Donzetta Matters at [facility].  Risks benefits complications including but not limited to bleeding infection steal non maturation thrombosis discussed.  Ruta Hinds, MD [facility] Office: [phone] Pager: [phone]

## 2017-07-02 LAB — HEPATITIS B SURFACE ANTIBODY,QUALITATIVE: Hep B S Ab: REACTIVE

## 2017-07-02 MED ORDER — VANCOMYCIN HCL IN DEXTROSE 1-5 GM/200ML-% IV SOLN
1000.0000 mg | INTRAVENOUS | Status: AC
  Administered 2017-07-04: 1000 mg via INTRAVENOUS
  Filled 2017-07-02: qty 200

## 2017-07-02 MED ORDER — KIDNEY FAILURE BOOK
Freq: Once | Status: AC
  Administered 2017-07-02: 17:00:00
  Filled 2017-07-02 (×2): qty 1

## 2017-07-02 NOTE — Progress Notes (Signed)
[facility] PROGRESS NOTE  ANGENETTE DAILY MWU:[id number] DOB: 09-09-1955 DOA: 06/27/2017  PCP: Rogers Blocker, MD  Brief History/Interval Summary: 62 y.o.femalewith medical history significant ofchronic kidney disease stage IV secondary to lupus nephritis and FSGS who is being followed in the outpatient setting and was being evaluated for possible kidney transplant. Her primary nephrologist Dr. Florene Glen has been managing her renal function but over the last month she's had progressive shortness of breath with exertion. Associated with weakness. Patient has also noted some lower extremity edema, she was seen by the nephrologist in the office and sent here to prepare for dialysis commencement.  Seen by nephrology.  Dialysis catheter was placed.  Patient started on hemodialysis.  Reason for Visit: End-stage renal disease  Consultants: Nephrology.  Vascular surgery.  Procedures:  Tunneled right IJ dialysis catheter  Hemodialysis  Antibiotics: None  Subjective/Interval History: Patient feels well this morning.  Denies any new complaints.    ROS: Denies any chest pain or shortness of breath.  Objective:  Vital Signs  Vitals:   07/01/17 2100 07/02/17 0428 07/02/17 0510 07/02/17 0849  BP: (!) 110/58  118/73 (!) 86/56  Pulse:   62 (!) 45  Resp:   17 20  Temp:   99 F (37.2 C) 98.7 F (37.1 C)  TempSrc:   Oral Oral  SpO2:   99% 100%  Weight: 94.8 kg (209 lb) 94.8 kg (208 lb 15.9 oz)    Height:        Intake/Output Summary (Last 24 hours) at 07/02/2017 1244 Last data filed at 07/02/2017 0906 Gross per 24 hour  Intake 540 ml  Output 1225 ml  Net -685 ml   Filed Weights   07/01/17 0450 07/01/17 2100 07/02/17 0428  Weight: 96.7 kg (213 lb 3 oz) 94.8 kg (209 lb) 94.8 kg (208 lb 15.9 oz)    General appearance: Awake alert.  In no distress. Resp: Clear to auscultation bilaterally.  No wheezing rales or rhonchi. Cardio: S1-S2 is normal regular.  No S3-S4.  No rubs  murmurs or bruit GI: Abdomen is soft.  Nontender nondistended.  Bowel sounds present.  No masses organomegaly Extremities: Mild lower extremity edema Neurological: No focal neurological deficits.   Lab Results:  Data Reviewed: I have personally reviewed following labs and imaging studies  CBC: Recent Labs  Lab 06/27/17 1553 06/28/17 0629 06/29/17 0925 06/30/17 2019  WBC 4.3 4.7 4.3 4.9  HGB 9.3* 8.6* 8.2* 8.3*  HCT 29.3* 27.0* 26.5* 26.1*  MCV 88.3 87.1 89.2 88.5  PLT 195 172 172 725    Basic Metabolic Panel: Recent Labs  Lab 06/27/17 1553 06/28/17 0629 06/29/17 0926 06/30/17 2019  NA 135 139 138 137  K 5.4* 4.8 4.5 3.7  CL 103 106 107 103  CO2 22 21* 22 24  GLUCOSE 111* 88 89 112*  BUN 81* 83* 71* 45*  CREATININE 5.49* 5.65* 4.94* 4.51*  CALCIUM 10.8* 10.0 9.4 8.5*  PHOS  --  6.4* 4.7* 3.4    GFR: Estimated Creatinine Clearance: 15.6 mL/min (A) (by C-G formula based on SCr of 4.51 mg/dL (H)).  Liver Function Tests: Recent Labs  Lab 06/28/17 0629 06/29/17 0926 06/30/17 2019  ALBUMIN 2.9* 2.9* 2.9*    Coagulation Profile: Recent Labs  Lab 06/28/17 0629  INR 1.09    Radiology Studies: No results found.   Medications:  Scheduled: . atenolol  25 mg Oral QHS  . [START ON 07/05/2017] darbepoetin (ARANESP) injection - DIALYSIS  100 mcg  Intravenous Q Tue-HD  . feeding supplement (NEPRO CARB STEADY)  237 mL Oral BID BM  . heparin  5,000 Units Subcutaneous Q8H  . hydroxychloroquine  200 mg Oral BID  . polyethylene glycol  17 g Oral Daily  . sodium chloride flush  3 mL Intravenous Q12H   Continuous: . sodium chloride    . ferric gluconate (FERRLECIT/NULECIT) IV 125 mg (07/01/17 1731)  . [START ON 07/04/2017] vancomycin     ELT:RVUYEB chloride, acetaminophen **OR** acetaminophen, calcium carbonate (dosed in mg elemental calcium), camphor-menthol **AND** hydrOXYzine, docusate sodium, heparin, heparin, HYDROcodone-acetaminophen, ondansetron **OR**  ondansetron (ZOFRAN) IV, sodium chloride flush, sorbitol, zolpidem  Assessment/Plan:  Principal Problem:   End stage renal disease ([name]) Active Problems:   Essential hypertension   Hx of lupus nephritis    History of chronic kidney disease stage V now in End-Stage Renal Disease  Patient with worsening symptoms over the last 1 month including shortness of breath and fatigue.  She is thought to have progressed to end-stage renal disease.  Patient has CKD due to underlying FSGS/lupus nephritis.  Tunneled dialysis catheter was placed 5/14.  Patient was started on hemodialysis.  Nephrology continues to follow.  Vascular surgery was consulted for permanent access with plans to do on Monday.  Nausea Possibly related to dialysis.  Treat symptomatically for now.  Abdomen is benign.  History of SLE Stable on Plaquenil. Continue.  Essential hypertension Patient developed orthostatic symptoms.  Blood pressure noted to be soft.  She is also noted to be bradycardic.  ARB was discontinued.  We will also discontinue her atenolol.    Anemia of chronic disease Stable no acute issues.  No overt bleeding.  DVT Prophylaxis: Subcutaneous heparin    Code Status: Full code Family Communication: Discussed with the patient Disposition Plan: Management as outlined above.  Fistula creation on Monday.  Will need to be established with outpatient dialysis center.  PT recommends skilled nursing facility for short-term rehab.    LOS: 5 days   [place] [facility] Pager [phone] 07/02/2017, 12:44 PM  If 7PM-7AM, please contact night-coverage at [email or web address], password [facility]

## 2017-07-02 NOTE — NC FL2 (Signed)
[place] LEVEL OF CARE SCREENING TOOL     IDENTIFICATION  Patient Name: Jenna [name] Birthdate: 04/03/1955 Sex: female Admission Date (Current Location): 06/27/2017  [place] and [place] Number:  Herbalist and Address:  [facility], [place] [address], [place], [place] [address]      Provider Number: [id number]  Attending Physician Name and Address:  Bonnielee Haff, MD  Relative Name and Phone Number:       Current Level of Care: Hospital Recommended Level of Care: [place] Prior Approval Number:    Date Approved/Denied: 07/02/17 PASRR Number: [id number]  Discharge Plan: SNF    Current Diagnoses: Patient Active Problem List   Diagnosis Date Noted  . End stage renal disease ([place]) 06/27/2017  . CKD (chronic kidney disease) stage 5, GFR less than 15 ml/min ([name]) 02/15/2017  . Back pain at L4-L5 level 10/31/2014  . Muscle spasm of back 10/31/2014  . Breast cancer screening 09/30/2014  . Visit for screening mammogram 09/30/2014  . Chronic maxillary sinusitis 07/04/2014  . Occipital headache 07/04/2014  . Increased urinary frequency 05/23/2014  . Falls 05/23/2014  . Prediabetes 05/23/2014  . Rash and nonspecific skin eruption 05/23/2014  . Essential hypertension 04/16/2014  . Encounter for immunization 11/19/2013  . Lupus (systemic lupus erythematosus) ([place]) 09/24/2013  . CKD (chronic kidney disease) stage 4, GFR 15-29 ml/min ([name]) 07/02/2013  . TIA (transient ischemic attack) 05/05/2013  . Numbness and tingling of left side of face 05/04/2013  . Lupus ([place]) 04/15/2011  . 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  . 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    Orientation RESPIRATION BLADDER Height & Weight     Self, Time, Situation, Place  Normal Continent Weight: 208 lb 15.9 oz (94.8 kg) Height:  5' 7.5" (171.5 cm)  BEHAVIORAL SYMPTOMS/MOOD NEUROLOGICAL BOWEL NUTRITION STATUS      Continent  Diet(Diet renal with fluid restriction Fluid restriction: 1200 mL Fluid, thin liquids)  AMBULATORY STATUS COMMUNICATION OF NEEDS Skin   Limited Assist Verbally Normal                       Personal Care Assistance Level of Assistance  Bathing, Feeding, Dressing Bathing Assistance: Limited assistance Feeding assistance: Independent Dressing Assistance: Limited assistance     Functional Limitations Info  Sight, Hearing, Speech Sight Info: Adequate Hearing Info: Adequate Speech Info: Adequate    SPECIAL CARE FACTORS FREQUENCY  PT (By licensed PT), OT (By licensed OT)     PT Frequency: 2x OT Frequency: 2x            Contractures Contractures Info: Not present    Additional Factors Info  Code Status, Allergies Code Status Info: Full Code Allergies Info: Enalapril Maleate, Chocolate, Penicillins           Current Medications (07/02/2017):  This is the current hospital active medication list Current Facility-Administered Medications  Medication Dose Route Frequency Provider Last Rate Last Dose  . 0.9 %  sodium chloride infusion  250 mL Intravenous PRN Elwyn Reach, MD      . acetaminophen (TYLENOL) tablet 650 mg  650 mg Oral Q6H PRN Elwyn Reach, MD   650 mg at 07/01/17 1550   Or  . acetaminophen (TYLENOL) suppository 650 mg  650 mg Rectal Q6H PRN Gala Romney L, MD      . calcium carbonate (dosed in mg elemental calcium) suspension 500 mg of elemental calcium  500 mg  of elemental calcium Oral Q6H PRN Elwyn Reach, MD      . camphor-menthol (SARNA) lotion 1 application  1 application Topical C3J PRN Elwyn Reach, MD       And  . hydrOXYzine (ATARAX/VISTARIL) tablet 25 mg  25 mg Oral Q8H PRN Elwyn Reach, MD      . Derrill Memo ON 07/05/2017] Darbepoetin Alfa (ARANESP) injection 100 mcg  100 mcg Intravenous Q Tue-HD Corliss Parish, MD      . docusate sodium (ENEMEEZ) enema 283 mg  1 enema Rectal PRN Elwyn Reach, MD      . feeding  supplement (NEPRO CARB STEADY) liquid 237 mL  237 mL Oral BID BM Thurnell Lose, MD   237 mL at 07/02/17 1018  . ferric gluconate (NULECIT) 125 mg in sodium chloride 0.9 % 100 mL IVPB  125 mg Intravenous Daily Corliss Parish, MD 110 mL/hr at 07/01/17 1731 125 mg at 07/01/17 1731  . heparin injection 1,000 Units  1,000 Units Dialysis PRN Corliss Parish, MD   1,000 Units at 06/30/17 2200  . heparin injection 1,900 Units  20 Units/kg Dialysis PRN Corliss Parish, MD   1,900 Units at 06/30/17 1850  . heparin injection 5,000 Units  5,000 Units Subcutaneous Q8H Monia Sabal, PA-C   5,000 Units at 06/29/17 1318  . HYDROcodone-acetaminophen (NORCO) 7.5-325 MG per tablet 1 tablet  1 tablet Oral Q6H PRN Thurnell Lose, MD   1 tablet at 06/30/17 2354  . hydroxychloroquine (PLAQUENIL) tablet 200 mg  200 mg Oral BID Jamal Maes, MD   200 mg at 07/02/17 1018  . kidney failure book   Does not apply Once Bonnielee Haff, MD      . ondansetron [facility]) tablet 4 mg  4 mg Oral Q6H PRN Elwyn Reach, MD       Or  . ondansetron (ZOFRAN) injection 4 mg  4 mg Intravenous Q6H PRN Elwyn Reach, MD   4 mg at 07/01/17 0943  . polyethylene glycol (MIRALAX / GLYCOLAX) packet 17 g  17 g Oral Daily Bonnielee Haff, MD   17 g at 07/01/17 1305  . sodium chloride flush (NS) 0.9 % injection 3 mL  3 mL Intravenous Q12H Gala Romney L, MD   3 mL at 07/02/17 1018  . sodium chloride flush (NS) 0.9 % injection 3 mL  3 mL Intravenous PRN Gala Romney L, MD      . sorbitol 70 % solution 30 mL  30 mL Oral PRN Elwyn Reach, MD      . Derrill Memo ON 07/04/2017] vancomycin (VANCOCIN) IVPB 1000 mg/200 mL premix  1,000 mg Intravenous To SSTC Rhyne, Samantha J, PA-C      . zolpidem (AMBIEN) tablet 5 mg  5 mg Oral QHS PRN Elwyn Reach, MD         Discharge Medications: Please see discharge summary for a list of discharge medications.  Relevant Imaging Results:  Relevant Lab  Results:   Additional Information SSN: [id number]  Eileen Stanford, LCSW

## 2017-07-02 NOTE — Progress Notes (Signed)
Subjective:    Cont to be very anxious, talkative with many small complaints.  Slated for 3rd HD treatment later today .  "how do you know I still need dialysis "   Objective Vital signs in last 24 hours: Vitals:   07/01/17 2100 07/02/17 0428 07/02/17 0510 07/02/17 0849  BP: (!) 110/58  118/73 (!) 86/56  Pulse:   62 (!) 45  Resp:   17 20  Temp:   99 F (37.2 C) 98.7 F (37.1 C)  TempSrc:   Oral Oral  SpO2:   99% 100%  Weight: 94.8 kg (209 lb) 94.8 kg (208 lb 15.9 oz)    Height:       Weight change: -3.598 kg (-7 lb 14.9 oz)  Intake/Output Summary (Last 24 hours) at 07/02/2017 1222 Last data filed at 07/02/2017 0906 Gross per 24 hour  Intake 540 ml  Output 1225 ml  Net -685 ml    Assessment/Recommendations  1. 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 Progressive past year, Stage 5 pretty much since 02/2017. Clinically uremic. Needs to start dialysis.  1. S/p  [facility] 5/14, first HD 5/15, second 5/16- plan for third today 5/18- will likely clip to TTS so next HD Tuesday since access Monday 2. CLIP in progress (I think will CLIP to [facility]) 3. Vmap done- have consulted VVS- for access Mon  2. Anemia - Fe studies ordered- sat 27%- start ferrlecit, started  Aranesp (100 mcg ordered [place] for 5/13) 3. Secondary HPT - hypercalcemic at 10.8. Stop outpt calcitriol - repeat PTH 45. Check phos- wait on binder for right now- want to see how HD will impact- recent is 3.4 4. Vascular access - vein mapping for AVF. TDC to start HD- VVS called. Access Mon 5. SLE - continue plaquenil 6. Hypertension - with symptomatic orthostasis and significant bradycardia- still appears to be issue. Had cut atenolol and stopped amlodipine- HR still in the 50's dec atenolol to 25 on 5/17- will stop today- min volume removal with HD at first     [place]: Basic Metabolic Panel: Recent Labs  Lab 06/28/17 0629 06/29/17 0926 06/30/17 2019  NA 139 138 137  K 4.8 4.5 3.7  CL 106  107 103  CO2 21* 22 24  GLUCOSE 88 89 112*  BUN 83* 71* 45*  CREATININE 5.65* 4.94* 4.51*  CALCIUM 10.0 9.4 8.5*  PHOS 6.4* 4.7* 3.4   Liver Function Tests: Recent Labs  Lab 06/28/17 0629 06/29/17 0926 06/30/17 2019  ALBUMIN 2.9* 2.9* 2.9*   No results for input(s): LIPASE, AMYLASE in the last 168 hours. No results for input(s): AMMONIA in the last 168 hours. CBC: Recent Labs  Lab 06/27/17 1553 06/28/17 0629 06/29/17 0925 06/30/17 2019  WBC 4.3 4.7 4.3 4.9  HGB 9.3* 8.6* 8.2* 8.3*  HCT 29.3* 27.0* 26.5* 26.1*  MCV 88.3 87.1 89.2 88.5  PLT 195 172 172 159   Cardiac Enzymes: No results for input(s): CKTOTAL, CKMB, CKMBINDEX, TROPONINI in the last 168 hours. CBG: No results for input(s): GLUCAP in the last 168 hours.  Iron Studies:  No results for input(s): IRON, TIBC, TRANSFERRIN, FERRITIN in the last 72 hours. Studies/Results: No results found. Medications: Infusions: . sodium chloride    . ferric gluconate (FERRLECIT/NULECIT) IV 125 mg (07/01/17 1731)  . [START ON 07/04/2017] vancomycin      Scheduled Medications: . atenolol  25 mg Oral QHS  . [START ON 07/05/2017] darbepoetin (ARANESP) injection -  DIALYSIS  100 mcg Intravenous Q Tue-HD  . feeding supplement (NEPRO CARB STEADY)  237 mL Oral BID BM  . heparin  5,000 Units Subcutaneous Q8H  . hydroxychloroquine  200 mg Oral BID  . polyethylene glycol  17 g Oral Daily  . sodium chloride flush  3 mL Intravenous Q12H    have reviewed scheduled and prn medications.  Physical Exam: General: obese, talkative, NAD Heart: RRR0 HR is better Lungs: mostly clear Abdomen: obese, soft Extremities: legs are obese- possibly just a little edema- slightly tender  Right sided PC    07/02/2017,12:22 PM  LOS: 5 days

## 2017-07-02 NOTE — Clinical Social Work Note (Signed)
Clinical Social Work Assessment  Patient Details  Name: Jenna [name] MRN: [id number] Date of Birth: March 23, 1955  Date of referral:  07/02/17               Reason for consult:  Facility Placement                Permission sought to share information with:  Family Supports Permission granted to share information::  Yes, Verbal Permission Granted  Name::     [name]  Agency::     Relationship::  Daughter  Contact Information:  [phone]  Housing/Transportation Living arrangements for the past 2 months:  [place] of Information:  Patient, Adult Children Patient Interpreter Needed:  None Criminal Activity/Legal Involvement Pertinent to Current Situation/Hospitalization:  No - Comment as needed Significant Relationships:  Adult Children Lives with:  Self Do you feel safe going back to the place where you live?  No Need for family participation in patient care:  No (Coment)  Care giving concerns:  Pt is alert and oriented. Pt's daughter at bedside.   Social Worker assessment / plan:  CSW spoke with pt and pt's daughter at bedside. Per pt's daughter pt will be moving in with her however, pt's daughter will not have her new place until the beginning of June. Pt's daughter inquiring about what the options are for that time in between. CSW explained SNF and pt and pt's daughter agreeable. Pt's daughter was provided with a list to start looking at the SNFs. Pt's daughter wants her to be in a [place] facility. Pt's daughter also inquiring about home health for when pt d/c from SNF. CSW explained that SNF would set that up. Pt's daughter asked if RNCM could call her just to explain how home health works. RNCM notified.   Employment status:  Retired Forensic scientist:  Medicare PT Recommendations:  [place] / Referral to community resources:  [place]  Patient/Family's Response to care:  Pt verbalized understanding of CSW  role and expressed appreciation for support. Pt denies any concern regarding pt care at this time.   Patient/Family's Understanding of and Emotional Response to Diagnosis, Current Treatment, and Prognosis:  Pt understanding and realistic regarding physical limitations. Pt understands the need for SNF placement at d/c. Pt agreeable to SNF placement at d/c, at this time. Pt's responses emotionally appropriate during conversation with CSW. Pt denies any concern regarding treatment plan at this time. CSW will continue to provide support and facilitate d/c needs.   Emotional Assessment Appearance:  Appears stated age Attitude/Demeanor/Rapport:  (Patient was appropriate) Affect (typically observed):  Accepting, Appropriate, Calm Orientation:  Oriented to Situation, Oriented to  Time, Oriented to Place, Oriented to Self Alcohol / Substance use:  Not Applicable Psych involvement (Current and /or in the community):  No (Comment)  Discharge Needs  Concerns to be addressed:  Care Coordination Readmission within the last 30 days:  No Current discharge risk:  Dependent with Mobility Barriers to Discharge:  Continued Medical Work up   [employer], LCSW 07/02/2017, 4:32 PM

## 2017-07-03 ENCOUNTER — Inpatient Hospital Stay (HOSPITAL_COMMUNITY): Payer: Medicare Other

## 2017-07-03 DIAGNOSIS — M25532 Pain in left wrist: Secondary | ICD-10-CM

## 2017-07-03 MED ORDER — POLYETHYLENE GLYCOL 3350 17 G PO PACK
17.0000 g | PACK | Freq: Three times a day (TID) | ORAL | Status: DC
  Administered 2017-07-03: 17 g via ORAL
  Filled 2017-07-03 (×2): qty 1

## 2017-07-03 MED ORDER — WHITE PETROLATUM EX OINT
TOPICAL_OINTMENT | CUTANEOUS | Status: AC
  Filled 2017-07-03: qty 28.35

## 2017-07-03 NOTE — Progress Notes (Signed)
Patient ID: Jenna [name], female   DOB: 1955-09-09, 62 y.o.   MRN: [id number] For left arm AV fistula versus AV graft tomorrow.  Had questions regarding the procedure.  Reviewed the plan for left arm AV fistula versus graft.  Explained that vein map showed vein most likely adequate for access creation.  All questions answered and she will be for surgery tomorrow at 0 730

## 2017-07-03 NOTE — Progress Notes (Signed)
Patient back from hemodialysis at 2215.  She is weepy and stating her left wrist is throbbing.  She rates pain as 9/10.  There is a knot on the posterior aspect of the wrist which is very tender to touch.  She stated she had some tingling in her fingers while in HD on that hand.  MD made aware.  Norco given at 2240.  Left wrist wrapped in ACE bandage and elevated.  Will continue to monitor patient.  Earleen Reaper RN-BC, [employer]

## 2017-07-03 NOTE — H&P (View-Only) (Signed)
Patient ID: Jenna [name], female   DOB: 01-19-1956, 62 y.o.   MRN: [id number] For left arm AV fistula versus AV graft tomorrow.  Had questions regarding the procedure.  Reviewed the plan for left arm AV fistula versus graft.  Explained that vein map showed vein most likely adequate for access creation.  All questions answered and she will be for surgery tomorrow at 0 730

## 2017-07-03 NOTE — Progress Notes (Addendum)
[facility] PROGRESS NOTE  Jenna [name] QZE:[id number] DOB: Jul 29, 1955 DOA: 06/27/2017  PCP: Rogers Blocker, MD  Brief History/Interval Summary: 62 y.o.femalewith medical history significant ofchronic kidney disease stage IV secondary to lupus nephritis and FSGS who is being followed in the outpatient setting and was being evaluated for possible kidney transplant. Her primary nephrologist Dr. Florene Glen has been managing her renal function but over the last month she's had progressive shortness of breath with exertion. Associated with weakness. Patient has also noted some lower extremity edema, she was seen by the nephrologist in the office and sent here to prepare for dialysis commencement.  Seen by nephrology.  Dialysis catheter was placed.  Patient started on hemodialysis.  Reason for Visit: End-stage renal disease  Consultants: Nephrology.  Vascular surgery.  Procedures:  Tunneled right IJ dialysis catheter  Hemodialysis  Antibiotics: None  Subjective/Interval History: She complains of pain in her left wrist.  Does not remember any obvious injury.  No shortness of breath.  Has not had a bowel movement yet..  ROS: No nausea or vomiting  Objective:  Vital Signs  Vitals:   07/02/17 2128 07/02/17 2200 07/03/17 0450 07/03/17 0500  BP: (!) 144/72 129/73 105/60   Pulse: 67 65 (!) 54   Resp: $Remo'17 16 17   'pStgr$ Temp: 98.7 F (37.1 C) 98.8 F (37.1 C) 99.6 F (37.6 C)   TempSrc: Oral Oral Oral   SpO2: 96% 99% 100%   Weight: 98.3 kg (216 lb 11.4 oz)   98.3 kg (216 lb 11.4 oz)  Height:        Intake/Output Summary (Last 24 hours) at 07/03/2017 0923 Last data filed at 07/03/2017 0600 Gross per 24 hour  Intake 720 ml  Output 1003 ml  Net -283 ml   Filed Weights   07/02/17 1800 07/02/17 2128 07/03/17 0500  Weight: 99.1 kg (218 lb 7.6 oz) 98.3 kg (216 lb 11.4 oz) 98.3 kg (216 lb 11.4 oz)    General appearance: Awake alert.  In no distress. Resp: Clear to auscultation  bilaterally. Cardio: S1-S2 is normal regular.  No S3-S4.  No rubs murmurs or bruit. GI: Abdomen remains soft.  Nontender nondistended.  Bowel sounds are present.  No masses organomegaly. Extremities: No deformity of the left wrist is noted.  Has reasonably good range of motion.  Good radial pulses. Neurological: No focal neurological deficits.   Lab Results:  Data Reviewed: I have personally reviewed following labs and imaging studies  CBC: Recent Labs  Lab 06/27/17 1553 06/28/17 0629 06/29/17 0925 06/30/17 2019  WBC 4.3 4.7 4.3 4.9  HGB 9.3* 8.6* 8.2* 8.3*  HCT 29.3* 27.0* 26.5* 26.1*  MCV 88.3 87.1 89.2 88.5  PLT 195 172 172 226    Basic Metabolic Panel: Recent Labs  Lab 06/27/17 1553 06/28/17 0629 06/29/17 0926 06/30/17 2019  NA 135 139 138 137  K 5.4* 4.8 4.5 3.7  CL 103 106 107 103  CO2 22 21* 22 24  GLUCOSE 111* 88 89 112*  BUN 81* 83* 71* 45*  CREATININE 5.49* 5.65* 4.94* 4.51*  CALCIUM 10.8* 10.0 9.4 8.5*  PHOS  --  6.4* 4.7* 3.4    GFR: Estimated Creatinine Clearance: 15.9 mL/min (A) (by C-G formula based on SCr of 4.51 mg/dL (H)).  Liver Function Tests: Recent Labs  Lab 06/28/17 0629 06/29/17 0926 06/30/17 2019  ALBUMIN 2.9* 2.9* 2.9*    Coagulation Profile: Recent Labs  Lab 06/28/17 0629  INR 1.09    Radiology Studies:  No results found.   Medications:  Scheduled: . [START ON 07/05/2017] darbepoetin (ARANESP) injection - DIALYSIS  100 mcg Intravenous Q Tue-HD  . feeding supplement (NEPRO CARB STEADY)  237 mL Oral BID BM  . heparin  5,000 Units Subcutaneous Q8H  . hydroxychloroquine  200 mg Oral BID  . polyethylene glycol  17 g Oral TID  . sodium chloride flush  3 mL Intravenous Q12H   Continuous: . sodium chloride    . [START ON 07/04/2017] vancomycin     ACQ:PEAKLT chloride, acetaminophen **OR** acetaminophen, calcium carbonate (dosed in mg elemental calcium), camphor-menthol **AND** hydrOXYzine, docusate sodium, heparin,  heparin, HYDROcodone-acetaminophen, ondansetron **OR** ondansetron (ZOFRAN) IV, sodium chloride flush, sorbitol, zolpidem  Assessment/Plan:  Principal Problem:   End stage renal disease ([name]) Active Problems:   Essential hypertension   Hx of lupus nephritis    History of chronic kidney disease stage V now progressed to end-Stage Renal Disease  Patient with worsening symptoms over the last 1 month including shortness of breath and fatigue.  She is thought to have progressed to end-stage renal disease.  Patient has CKD due to underlying FSGS/lupus nephritis.  Tunneled dialysis catheter was placed 5/14.  Patient was started on hemodialysis.  Nephrology continues to follow.  Plan is for permanent access by vascular surgery tomorrow.    Nausea Appears to have resolved.  She is tolerating her diet.  Possibly related to dialysis.    Left wrist pain No obvious deformity noted.  She could have sprained her wrist.  Will get x-ray to make sure there is no osseous abnormality.  History of SLE Stable on Plaquenil. Continue.  Essential hypertension Develop orthostatic symptoms and her blood pressure was noted to be soft.  So her antihypertensives including the losartan, furosemide, atenolol and amlodipine have been discontinued.  Continue to monitor for now.     Anemia of chronic disease Stable no acute issues.  No overt bleeding.  DVT Prophylaxis: Subcutaneous heparin    Code Status: Full code Family Communication: Discussed with the patient and her daughter Disposition Plan: Fistula creation on Monday.  It looks like next dialysis will be on Tuesday 5/21. Will need to be established with outpatient dialysis center.  PT recommends skilled nursing facility for short-term rehab.    LOS: 6 days   [facility] Pager [phone] 07/03/2017, 9:23 AM  If 7PM-7AM, please contact night-coverage at [email or web address], password [facility]

## 2017-07-03 NOTE — Progress Notes (Signed)
Subjective:    Had third HD last night-  Removed 800.  "it was horrible"  Complaints about infection technique per nurse, machine beeping, sprained wrist moving in bed because "no one helped me " Planning for access tomorrow AM Objective Vital signs in last 24 hours: Vitals:   07/02/17 2200 07/03/17 0450 07/03/17 0500 07/03/17 0949  BP: 129/73 105/60  (!) 85/59  Pulse: 65 (!) 54  (!) 56  Resp: $Remo'16 17  18  'rupEY$ Temp: 98.8 F (37.1 C) 99.6 F (37.6 C)  98.2 F (36.8 C)  TempSrc: Oral Oral    SpO2: 99% 100%  100%  Weight:   98.3 kg (216 lb 11.4 oz)   Height:       Weight change: 4.298 kg (9 lb 7.6 oz)  Intake/Output Summary (Last 24 hours) at 07/03/2017 1050 Last data filed at 07/03/2017 0930 Gross per 24 hour  Intake 960 ml  Output 1003 ml  Net -43 ml    Assessment/Recommendations  1. 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 Progressive past year, Stage 5 pretty much since 02/2017. Clinically uremic. Needed to start dialysis.  1. S/p  [facility] 5/14, first HD 5/15, second 5/16- plan for third 5/18- will likely clip to TTS so next HD Tuesday since access Monday-  Consider to do next tx as OP as she is not comfortable getting more HD in house due to complaints above  2. CLIP in progress (I think will CLIP to [facility]) 3. Vmap done- have consulted [facility]- for access Mon  2. Anemia - Fe studies ordered- sat 27%- started ferrlecit, started  Aranesp (100 mcg ordered [place] for 5/13) 3. Secondary HPT - hypercalcemic at 10.8. Stop outpt calcitriol - repeat PTH 45. Check phos- wait on binder for right now- want to see how HD will impact- recent is 3.4 4. Vascular access - vein mapping for AVF. TDC to start HD- [facility] for access Mon 5. SLE - continue plaquenil 6. Hypertension - with symptomatic orthostasis and significant bradycardia- still appears to be issue. Have now stopped all BP meds- min UF with HD   Aidel Davisson A    Labs: Basic Metabolic Panel: Recent Labs  Lab 06/28/17 0629  06/29/17 0926 06/30/17 2019  NA 139 138 137  K 4.8 4.5 3.7  CL 106 107 103  CO2 21* 22 24  GLUCOSE 88 89 112*  BUN 83* 71* 45*  CREATININE 5.65* 4.94* 4.51*  CALCIUM 10.0 9.4 8.5*  PHOS 6.4* 4.7* 3.4   Liver Function Tests: Recent Labs  Lab 06/28/17 0629 06/29/17 0926 06/30/17 2019  ALBUMIN 2.9* 2.9* 2.9*   No results for input(s): LIPASE, AMYLASE in the last 168 hours. No results for input(s): AMMONIA in the last 168 hours. CBC: Recent Labs  Lab 06/27/17 1553 06/28/17 0629 06/29/17 0925 06/30/17 2019  WBC 4.3 4.7 4.3 4.9  HGB 9.3* 8.6* 8.2* 8.3*  HCT 29.3* 27.0* 26.5* 26.1*  MCV 88.3 87.1 89.2 88.5  PLT 195 172 172 159   Cardiac Enzymes: No results for input(s): CKTOTAL, CKMB, CKMBINDEX, TROPONINI in the last 168 hours. CBG: No results for input(s): GLUCAP in the last 168 hours.  Iron Studies:  No results for input(s): IRON, TIBC, TRANSFERRIN, FERRITIN in the last 72 hours. Studies/Results: No results found. Medications: Infusions: . sodium chloride    . [START ON 07/04/2017] vancomycin      Scheduled Medications: . [START ON 07/05/2017] darbepoetin (ARANESP) injection - DIALYSIS  100 mcg Intravenous Q Tue-HD  .  feeding supplement (NEPRO CARB STEADY)  237 mL Oral BID BM  . heparin  5,000 Units Subcutaneous Q8H  . hydroxychloroquine  200 mg Oral BID  . polyethylene glycol  17 g Oral TID  . sodium chloride flush  3 mL Intravenous Q12H    have reviewed scheduled and prn medications.  Physical Exam: General: obese, talkative, NAD Heart: RRR0 HR is better Lungs: mostly clear Abdomen: obese, soft Extremities: legs are obese- possibly just a little edema- slightly tender  Right sided PC    07/03/2017,10:50 AM  LOS: 6 days

## 2017-07-04 ENCOUNTER — Encounter (HOSPITAL_COMMUNITY)
Admission: EM | Disposition: A | Discharge status: Skilled Nursing Facility | Payer: Self-pay | Source: Home / Self Care | Attending: Internal Medicine

## 2017-07-04 ENCOUNTER — Inpatient Hospital Stay (HOSPITAL_COMMUNITY): Payer: Medicare Other | Admitting: Anesthesiology

## 2017-07-04 ENCOUNTER — Encounter (HOSPITAL_COMMUNITY): Payer: Self-pay | Admitting: Anesthesiology

## 2017-07-04 HISTORY — PX: AV FISTULA PLACEMENT: SHX1204

## 2017-07-04 LAB — BASIC METABOLIC PANEL
ANION GAP: 14 (ref 5–15)
BUN: 36 mg/dL — AB (ref 6–20)
CHLORIDE: 94 mmol/L — AB (ref 101–111)
CO2: 26 mmol/L (ref 22–32)
Calcium: 8 mg/dL — ABNORMAL LOW (ref 8.9–10.3)
Creatinine, Ser: 5.07 mg/dL — ABNORMAL HIGH (ref 0.44–1.00)
GFR calc Af Amer: 10 mL/min — ABNORMAL LOW (ref >60)
GFR, EST NON AFRICAN AMERICAN: 8 mL/min — AB (ref >60)
GLUCOSE: 102 mg/dL — AB (ref 65–99)
Potassium: 3.5 mmol/L (ref 3.5–5.1)
Sodium: 134 mmol/L — ABNORMAL LOW (ref 135–145)

## 2017-07-04 LAB — PROTIME-INR
INR: 1.07
Prothrombin Time: 13.8 seconds (ref 11.4–15.2)

## 2017-07-04 LAB — SURGICAL PCR SCREEN
MRSA, PCR: NEGATIVE
STAPHYLOCOCCUS AUREUS: NEGATIVE

## 2017-07-04 LAB — CBC
HCT: 25.9 % — ABNORMAL LOW (ref 36.0–46.0)
HEMOGLOBIN: 8 g/dL — AB (ref 12.0–15.0)
MCH: 27.8 pg (ref 26.0–34.0)
MCHC: 30.9 g/dL (ref 30.0–36.0)
MCV: 89.9 fL (ref 78.0–100.0)
Platelets: 134 10*3/uL — ABNORMAL LOW (ref 150–400)
RBC: 2.88 MIL/uL — ABNORMAL LOW (ref 3.87–5.11)
RDW: 14.1 % (ref 11.5–15.5)
WBC: 5 10*3/uL (ref 4.0–10.5)

## 2017-07-04 LAB — GLUCOSE, CAPILLARY: Glucose-Capillary: 89 mg/dL (ref 65–99)

## 2017-07-04 SURGERY — ARTERIOVENOUS (AV) FISTULA CREATION
Anesthesia: Monitor Anesthesia Care | Laterality: Left

## 2017-07-04 MED ORDER — LIDOCAINE-EPINEPHRINE 0.5 %-1:200000 IJ SOLN
INTRAMUSCULAR | Status: AC
  Filled 2017-07-04: qty 1

## 2017-07-04 MED ORDER — OXYCODONE HCL 5 MG/5ML PO SOLN: 5.0000 mg | Freq: Once | ORAL | Status: DC | PRN

## 2017-07-04 MED ORDER — ONDANSETRON HCL 4 MG/2ML IJ SOLN
INTRAMUSCULAR | Status: AC
  Filled 2017-07-04: qty 2

## 2017-07-04 MED ORDER — OXYCODONE HCL 5 MG PO TABS: 5.0000 mg | ORAL_TABLET | Freq: Once | ORAL | Status: DC | PRN

## 2017-07-04 MED ORDER — PROPOFOL 500 MG/50ML IV EMUL
INTRAVENOUS | Status: DC | PRN
  Administered 2017-07-04: 25 ug/kg/min via INTRAVENOUS

## 2017-07-04 MED ORDER — PROPOFOL 10 MG/ML IV BOLUS
INTRAVENOUS | Status: AC
  Filled 2017-07-04: qty 20

## 2017-07-04 MED ORDER — LIDOCAINE 2% (20 MG/ML) 5 ML SYRINGE
INTRAMUSCULAR | Status: AC
  Filled 2017-07-04: qty 5

## 2017-07-04 MED ORDER — FENTANYL CITRATE (PF) 100 MCG/2ML IJ SOLN
INTRAMUSCULAR | Status: DC | PRN
  Administered 2017-07-04 (×5): 50 ug via INTRAVENOUS

## 2017-07-04 MED ORDER — SODIUM CHLORIDE 0.9 % IV SOLN
INTRAVENOUS | Status: AC
  Filled 2017-07-04: qty 1.2

## 2017-07-04 MED ORDER — ROCURONIUM BROMIDE 10 MG/ML (PF) SYRINGE
PREFILLED_SYRINGE | INTRAVENOUS | Status: AC
  Filled 2017-07-04: qty 5

## 2017-07-04 MED ORDER — DARBEPOETIN ALFA 200 MCG/0.4ML IJ SOSY
200.0000 ug | PREFILLED_SYRINGE | INTRAMUSCULAR | Status: DC
  Administered 2017-07-05: 200 ug via INTRAVENOUS
  Filled 2017-07-04: qty 0.4

## 2017-07-04 MED ORDER — MIDAZOLAM HCL 2 MG/2ML IJ SOLN
INTRAMUSCULAR | Status: AC
  Filled 2017-07-04: qty 2

## 2017-07-04 MED ORDER — 0.9 % SODIUM CHLORIDE (POUR BTL) OPTIME
TOPICAL | Status: DC | PRN
  Administered 2017-07-04: 1000 mL

## 2017-07-04 MED ORDER — SODIUM CHLORIDE 0.9 % IV SOLN: INTRAVENOUS | Status: DC

## 2017-07-04 MED ORDER — POLYETHYLENE GLYCOL 3350 17 G PO PACK
17.0000 g | PACK | Freq: Two times a day (BID) | ORAL | Status: DC
  Administered 2017-07-04 – 2017-07-05 (×2): 17 g via ORAL
  Filled 2017-07-04 (×2): qty 1

## 2017-07-04 MED ORDER — LIDOCAINE-EPINEPHRINE 0.5 %-1:200000 IJ SOLN
INTRAMUSCULAR | Status: DC | PRN
  Administered 2017-07-04: 50 mL

## 2017-07-04 MED ORDER — EPHEDRINE SULFATE 50 MG/ML IJ SOLN
INTRAMUSCULAR | Status: DC | PRN
  Administered 2017-07-04 (×2): 10 mg via INTRAVENOUS

## 2017-07-04 MED ORDER — MIDAZOLAM HCL 5 MG/5ML IJ SOLN
INTRAMUSCULAR | Status: DC | PRN
  Administered 2017-07-04: 1 mg via INTRAVENOUS

## 2017-07-04 MED ORDER — SODIUM CHLORIDE 0.9 % IV SOLN
INTRAVENOUS | Status: DC | PRN
  Administered 2017-07-04: 13:00:00

## 2017-07-04 MED ORDER — FENTANYL CITRATE (PF) 250 MCG/5ML IJ SOLN
INTRAMUSCULAR | Status: AC
  Filled 2017-07-04: qty 5

## 2017-07-04 MED ORDER — HYDROMORPHONE HCL 2 MG/ML IJ SOLN: 0.2500 mg | INTRAMUSCULAR | Status: DC | PRN

## 2017-07-04 SURGICAL SUPPLY — 28 items
ARMBAND PINK RESTRICT EXTREMIT (MISCELLANEOUS) ×3 IMPLANT
CANISTER SUCT 3000ML PPV (MISCELLANEOUS) ×2 IMPLANT
CANNULA VESSEL 3MM 2 BLNT TIP (CANNULA) ×2 IMPLANT
CLIP LIGATING EXTRA MED SLVR (CLIP) ×2 IMPLANT
CLIP LIGATING EXTRA SM BLUE (MISCELLANEOUS) ×2 IMPLANT
COVER PROBE W GEL 5X96 (DRAPES) ×2 IMPLANT
DECANTER SPIKE VIAL GLASS SM (MISCELLANEOUS) ×1 IMPLANT
DERMABOND ADVANCED (GAUZE/BANDAGES/DRESSINGS) ×1
DERMABOND ADVANCED .7 DNX12 (GAUZE/BANDAGES/DRESSINGS) ×1 IMPLANT
ELECT REM PT RETURN 9FT ADLT (ELECTROSURGICAL) ×2
ELECTRODE REM PT RTRN 9FT ADLT (ELECTROSURGICAL) ×1 IMPLANT
GLOVE BIOGEL PI IND STRL 6.5 (GLOVE) IMPLANT
GLOVE BIOGEL PI INDICATOR 6.5 (GLOVE) ×3
GLOVE SS BIOGEL STRL SZ 7.5 (GLOVE) ×1 IMPLANT
GLOVE SUPERSENSE BIOGEL SZ 7.5 (GLOVE) ×1
GOWN STRL REUS W/ TWL LRG LVL3 (GOWN DISPOSABLE) ×3 IMPLANT
GOWN STRL REUS W/TWL LRG LVL3 (GOWN DISPOSABLE) ×4
KIT BASIN OR (CUSTOM PROCEDURE TRAY) ×2 IMPLANT
KIT TURNOVER KIT B (KITS) ×2 IMPLANT
NS IRRIG 1000ML POUR BTL (IV SOLUTION) ×2 IMPLANT
PACK CV ACCESS (CUSTOM PROCEDURE TRAY) ×2 IMPLANT
PAD ARMBOARD 7.5X6 YLW CONV (MISCELLANEOUS) ×4 IMPLANT
SUT PROLENE 6 0 CC (SUTURE) ×3 IMPLANT
SUT VIC AB 3-0 SH 27 (SUTURE) ×1
SUT VIC AB 3-0 SH 27X BRD (SUTURE) ×1 IMPLANT
TOWEL GREEN STERILE (TOWEL DISPOSABLE) ×2 IMPLANT
UNDERPAD 30X30 (UNDERPADS AND DIAPERS) ×2 IMPLANT
WATER STERILE IRR 1000ML POUR (IV SOLUTION) ×2 IMPLANT

## 2017-07-04 NOTE — Transfer of Care (Signed)
Immediate Anesthesia Transfer of Care Note  Patient: Jenna [name]  Procedure(s) Performed: ARTERIOVENOUS (AV) FISTULA CREATION BRACHIOCEPHALIC (Left )  Patient Location: PACU  Anesthesia Type:MAC  Level of Consciousness: awake  Airway & Oxygen Therapy: Patient Spontanous Breathing and Patient connected to face mask oxygen  Post-op Assessment: Report given to RN and Post -op Vital signs reviewed and stable  Post vital signs: Reviewed and stable  Last Vitals:  Vitals Value Taken Time  BP 125/65 07/04/2017  2:15 PM  Temp    Pulse 68 07/04/2017  2:18 PM  Resp 17 07/04/2017  2:18 PM  SpO2 99 % 07/04/2017  2:18 PM  Vitals shown include unvalidated device data.  Last Pain:  Vitals:   07/04/17 1026  TempSrc:   PainSc: 0-No pain      Patients Stated Pain Goal: 0 (13/24/40 1027)  Complications: No apparent anesthesia complications

## 2017-07-04 NOTE — Discharge Instructions (Signed)
[facility]  Discharge Instructions  AV Fistula or Graft Surgery for Dialysis Access  Please refer to the following instructions for your post-procedure care. Your surgeon or physician assistant will discuss any changes with you.  Activity  You may drive the day following your surgery, if you are comfortable and no longer taking prescription pain medication. Resume full activity as the soreness in your incision resolves.  Bathing/Showering  You may shower after you go home. Keep your incision dry for 48 hours. Do not soak in a bathtub, hot tub, or swim until the incision heals completely. You may not shower if you have a hemodialysis catheter.  Incision Care  Clean your incision with mild soap and water after 48 hours. Pat the area dry with a clean towel. You do not need a bandage unless otherwise instructed. Do not apply any ointments or creams to your incision. You may have skin glue on your incision. Do not peel it off. It will come off on its own in about one week. Your arm may swell a bit after surgery. To reduce swelling use pillows to elevate your arm so it is above your heart. Your doctor will tell you if you need to lightly wrap your arm with an ACE bandage.  Diet  Resume your normal diet. There are not special food restrictions following this procedure. In order to heal from your surgery, it is CRITICAL to get adequate nutrition. Your body requires vitamins, minerals, and protein. Vegetables are the best source of vitamins and minerals. Vegetables also provide the perfect balance of protein. Processed food has little nutritional value, so try to avoid this.  Medications  Resume taking all of your medications. If your incision is causing pain, you may take over-the counter pain relievers such as acetaminophen (Tylenol). If you were prescribed a stronger pain medication, please be aware these medications can cause nausea and constipation. Prevent  nausea by taking the medication with a snack or meal. Avoid constipation by drinking plenty of fluids and eating foods with high amount of fiber, such as fruits, vegetables, and grains. Do not take Tylenol if you are taking prescription pain medications.     Follow up Your surgeon may want to see you in the office following your access surgery. If so, this will be arranged at the time of your surgery.  Please call us immediately for any of the following conditions:  Increased pain, redness, drainage (pus) from your incision site Fever of 101 degrees or higher Severe or worsening pain at your incision site Hand pain or numbness.  Reduce your risk of vascular disease:  Stop smoking. If you would like help, call [facility] at 1-800-QUIT-NOW 272-680-9342) or [place] at [place] your cholesterol Maintain a desired weight Control your diabetes Keep your blood pressure down  Dialysis  It will take several weeks to several months for your new dialysis access to be ready for use. Your surgeon will determine when it is OK to use it. Your nephrologist will continue to direct your dialysis. You can continue to use your Permcath until your new access is ready for use.  If you have any questions, please call the office at [phone]. [facility] Stay Proper nutrition can help your body recover from illness and injury.   Foods and beverages high in protein, vitamins, and minerals help rebuild muscle loss, promote healing, & reduce fall risk.   In addition to eating healthy foods, a nutrition shake  is an easy, delicious way to get the nutrition you need during and after your hospital stay  It is recommended that you continue to drink 2 bottles per day of:       Nepro for at least 1 month (30 days) after your hospital stay   Tips for adding a nutrition shake into your routine: As allowed, drink one with vitamins or medications instead of water or juice Enjoy  one as a tasty mid-morning or afternoon snack Drink cold or make a milkshake out of it Drink one instead of milk with cereal or snacks Use as a coffee creamer   Available at the following grocery stores and pharmacies:           * [place] [phone]            For COUPONS visit: www.ensure.com/join or http://dawson-may.com/   Suggested Substitutions Ensure Plus = Boost Plus = Carnation Breakfast Essentials = Boost Compact Ensure Active Clear = Boost Breeze Glucerna Shake = Boost Glucose Control = Carnation Breakfast Essentials SUGAR FREE

## 2017-07-04 NOTE — Progress Notes (Signed)
[facility] Rounding Note  Subjective:     Has had totla 3 HD sessions - last one described as "horrible"  Had complaints about infection technique per nurse, machine beeping, sprained wrist moving in bed because "no one helped me "  Had L BC AVF today by Dr. Donnetta Hutching  Had Objective Vital signs in last 24 hours: Vitals:   07/04/17 0242 07/04/17 0615 07/04/17 1415 07/04/17 1430  BP:  111/61 125/65 118/65  Pulse:  65 68 67  Resp:  $Remo'18 15 16  'uTNNt$ Temp:  99 F (37.2 C) 98.1 F (36.7 C) 98.1 F (36.7 C)  TempSrc:  Oral    SpO2:  99% 100% 98%  Weight: 98.3 kg (216 lb 11.4 oz)     Height:       Weight change: -0.8 kg (-1 lb 12.2 oz)  Intake/Output Summary (Last 24 hours) at 07/04/2017 1457 Last data filed at 07/04/2017 1358 Gross per 24 hour  Intake 540 ml  Output 955 ml  Net -415 ml    Physical Exam: General: obese, talkative, NAD Regular rhythm,  HR still 60's (from 40's) Normal S1S2 Lungs clear Abdomen obese, not tender Legs are obese, tender, minimal edema R IJ TDC (5/14) and L BC AVF (5/20)   Recent Labs  Lab 06/28/17 0629 06/29/17 0926 06/30/17 2019 07/04/17 0751  NA 139 138 137 134*  K 4.8 4.5 3.7 3.5  CL 106 107 103 94*  CO2 21* $Remov'22 24 26  'kFANMA$ GLUCOSE 88 89 112* 102*  BUN 83* 71* 45* 36*  CREATININE 5.65* 4.94* 4.51* 5.07*  CALCIUM 10.0 9.4 8.5* 8.0*  PHOS 6.4* 4.7* 3.4  --     Recent Labs  Lab 06/28/17 0629 06/29/17 0926 06/30/17 2019  ALBUMIN 2.9* 2.9* 2.9*    Recent Labs  Lab 06/27/17 1553 06/28/17 0629 06/29/17 0925 06/30/17 2019 07/04/17 0751  WBC 4.3 4.7 4.3 4.9 5.0  HGB 9.3* 8.6* 8.2* 8.3* 8.0*  HCT 29.3* 27.0* 26.5* 26.1* 25.9*  MCV 88.3 87.1 89.2 88.5 89.9  PLT 195 172 172 159 134*    Recent Labs  Lab 07/04/17 0558  GLUCAP 89    Studies/Results: Dg Wrist 2 Views Left  Result Date: 07/03/2017 CLINICAL DATA:  Generalized left wrist pain since last night. No injury. EXAM: LEFT WRIST - 2 VIEW COMPARISON:  None. FINDINGS: Mild degenerate  changes over the wrist. No acute fracture or dislocation. No bony erosions. Slight ulnar minus variance. IMPRESSION: No acute findings. Mild degenerate change of the wrist. Electronically Signed   By: Marin Olp M.D.   On: 07/03/2017 13:30   Scheduled Medications: . [START ON 07/05/2017] darbepoetin (ARANESP) injection - DIALYSIS  100 mcg Intravenous Q Tue-HD  . feeding supplement (NEPRO CARB STEADY)  237 mL Oral BID BM  . heparin  5,000 Units Subcutaneous Q8H  . hydroxychloroquine  200 mg Oral BID  . polyethylene glycol  17 g Oral BID  . sodium chloride flush  3 mL Intravenous Q12H    Assessment/Recommendations  1. 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 Progressive past year, Stage 5 pretty much since 02/2017. Clinically uremic. Needed to start dialysis.  1. S/p  [facility] 5/14, first HD 5/15, second 5/16, third 5/18 and due again tomorrow 2. She has CLIPPED to [facility] and am told cannot start there until Thursday so will need HD here tomorrow 3. Access wise has new L BC AVF done today 5/20 by Dr. Donnetta Hutching 2. Anemia - Fe studies ordered- sat  27%- started ferrlecit, started  Aranesp (100 mcg ordered [place] for 5/13; redose with next HD tomorrow) 3. Secondary HPT - hypercalcemic at 10.8. Stopped outpt calcitriol - repeated PTH 45. Phos has been fine so far without binder 4. SLE - continue plaquenil 5. Hypertension - with symptomatic orthostasis and significant bradycardia on admission. All BP meds have been stopped. EDW looks to be about [place], MD [employer] [phone] Pager 07/04/2017, 2:59 PM

## 2017-07-04 NOTE — Op Note (Signed)
    OPERATIVE REPORT  DATE OF SURGERY: 07/04/2017  PATIENT: Jenna [name], 62 y.o. female MRN: [id number]  DOB: Aug 31, 1955  PRE-OPERATIVE DIAGNOSIS: End-stage renal disease  POST-OPERATIVE DIAGNOSIS:  Same  PROCEDURE: Left brachiocephalic AV fistula creation  SURGEON:  Curt Jews, M.D.  PHYSICIAN ASSISTANT: Laurence Slate, PA-C  ANESTHESIA: Local with sedation  EBL: Minimal ml  Total I/O In: 300 [I.V.:300] Out: 5 [Blood:5]  BLOOD ADMINISTERED: None  DRAINS: None none  SPECIMEN: None  COUNTS CORRECT:  YES  PLAN OF CARE: PACU  PATIENT DISPOSITION:  PACU - hemodynamically stable  PROCEDURE DETAILS: The patient was taken to the operating room placed in supine position where the area of the left arm and left hand were prepped and draped in usual sterile fashion.  SonoSite ultrasound was used to visualize the cephalic vein.  The vein was a very good caliber at the antecubital space and in the upper arm.  The vein had branching in the distal third of the forearm and multiple small branches that were not adequate for fistula attempts.  The brachial artery was exposed to the same incision.  There appeared to be high bifurcation of the brachial artery.  There was a small artery in this region and also a larger artery deep.  The larger artery was encircled with a vessel loop.  The cephalic vein was ligated and divided.  The vein was brought into approximation with the brachial artery.  The artery was occluded proximally distally and was opened with an 11 blade and sent longitudinally with Potts scissors.  A small arteriotomy was created.  The vein was spatulated and sewn end-to-side to the brachial artery with a running 6-0 Prolene suture.  Clamps were removed and excellent thrill was noted through the fistula.  Patient did have Doppler flow at the artery radial and ulnar artery at the wrist which did augment with compression of the fistula.  Wounds irrigated with saline.  Hemostasis  cautery.  Wounds were closed with 3-0 Vicryl in the subcutaneous and subcuticular tissue.  Sterile dressing was applied and the patient was transferred to the recovery room in stable condition   [name], M.D., [facility] 07/04/2017 2:21 PM

## 2017-07-04 NOTE — Anesthesia Procedure Notes (Signed)
Procedure Name: MAC Date/Time: 07/04/2017 1:14 PM Performed by: Lieutenant Diego, CRNA Pre-anesthesia Checklist: Patient identified, Emergency Drugs available, Suction available, Patient being monitored and Timeout performed Patient Re-evaluated:Patient Re-evaluated prior to induction Oxygen Delivery Method: Simple face mask Preoxygenation: Pre-oxygenation with 100% oxygen Induction Type: IV induction

## 2017-07-04 NOTE — Anesthesia Preprocedure Evaluation (Addendum)
Anesthesia Evaluation  Patient identified by MRN, date of birth, ID band Patient awake    Reviewed: Allergy & Precautions, NPO status , Patient's Chart, lab work & pertinent test results  Airway Mallampati: II  TM Distance: >3 FB Neck ROM: Full    Dental  (+) Edentulous Upper, Edentulous Lower   Pulmonary neg pulmonary ROS,    breath sounds clear to auscultation       Cardiovascular hypertension, + Peripheral Vascular Disease   Rhythm:Regular Rate:Normal     Neuro/Psych TIA   GI/Hepatic negative GI ROS, Neg liver ROS,   Endo/Other    Renal/GU CRF and ESRFRenal diseaseKnown lupus     Musculoskeletal   Abdominal   Peds  Hematology  (+) anemia ,   Anesthesia Other Findings   Reproductive/Obstetrics                           Anesthesia Physical Anesthesia Plan  ASA: III  Anesthesia Plan: MAC   Post-op Pain Management:    Induction: Intravenous  PONV Risk Score and Plan: 3 and Dexamethasone  Airway Management Planned: Simple Face Mask and Natural Airway  Additional Equipment:   Intra-op Plan:   Post-operative Plan:   Informed Consent: I have reviewed the patients History and Physical, chart, labs and discussed the procedure including the risks, benefits and alternatives for the proposed anesthesia with the patient or authorized representative who has indicated his/her understanding and acceptance.     Plan Discussed with: CRNA  Anesthesia Plan Comments:         Anesthesia Quick Evaluation

## 2017-07-04 NOTE — Progress Notes (Signed)
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 Her primary nephrologist Dr. Florene Glen has been managing her renal function but over the last month she's had progressive shortness of breath with exertion. Associated with weakness. Patient has also noted some lower extremity edema, she was seen by the nephrologist in the office and sent here to prepare for dialysis commencement.  Seen by nephrology.  Dialysis catheter was placed.  Patient started on hemodialysis.  Reason for Visit: End-stage renal disease  Consultants: Nephrology.  Vascular surgery.  Procedures:  Tunneled right IJ dialysis catheter  Hemodialysis  Antibiotics: None  Subjective/Interval History: Patient waiting on her surgery scheduled for today.  She is requesting a consultation with the dietitian.  Left wrist pain has improved.  ROS: Denies any shortness of breath.  No nausea or vomiting  Objective:  Vital Signs  Vitals:   07/03/17 1655 07/03/17 2253 07/04/17 0242 07/04/17 0615  BP: 105/62 116/60  111/61  Pulse: 61 63  65  Resp: $Remo'18 18  18  'EsHkJ$ Temp: 98.3 F (36.8 C) 98.4 F (36.9 C)  99 F (37.2 C)  TempSrc: Oral Oral  Oral  SpO2: 100% 100%  99%  Weight:   98.3 kg (216 lb 11.4 oz)   Height:        Intake/Output Summary (Last 24 hours) at 07/04/2017 1207 Last data filed at 07/04/2017 0600 Gross per 24 hour  Intake 800 ml  Output 950 ml  Net -150 ml   Filed Weights   07/02/17 2128 07/03/17 0500 07/04/17 0242  Weight: 98.3 kg (216 lb 11.4 oz) 98.3 kg (216 lb 11.4 oz) 98.3 kg (216 lb 11.4 oz)    General appearance: alert, cooperative, appears stated age and no distress Resp:  clear to auscultation bilaterally Cardio: regular rate and rhythm, S1, S2 normal, no murmur, click, rub or gallop GI: soft, non-tender; bowel sounds normal; no masses,  no organomegaly Extremities: Improved range of motion of the left wrist Neurologic: No focal deficits.  Lab Results:  Data Reviewed: I have personally reviewed following labs and imaging studies  CBC: Recent Labs  Lab 06/27/17 1553 06/28/17 0629 06/29/17 0925 06/30/17 2019 07/04/17 0751  WBC 4.3 4.7 4.3 4.9 5.0  HGB 9.3* 8.6* 8.2* 8.3* 8.0*  HCT 29.3* 27.0* 26.5* 26.1* 25.9*  MCV 88.3 87.1 89.2 88.5 89.9  PLT 195 172 172 159 134*    Basic Metabolic Panel: Recent Labs  Lab 06/27/17 1553 06/28/17 0629 06/29/17 0926 06/30/17 2019 07/04/17 0751  NA 135 139 138 137 134*  K 5.4* 4.8 4.5 3.7 3.5  CL 103 106 107 103 94*  CO2 22 21* $Remo'22 24 26  'LVzvn$ GLUCOSE 111* 88 89 112* 102*  BUN 81* 83* 71* 45* 36*  CREATININE 5.49* 5.65* 4.94* 4.51* 5.07*  CALCIUM 10.8* 10.0 9.4 8.5* 8.0*  PHOS  --  6.4* 4.7* 3.4  --     GFR: Estimated Creatinine Clearance: 14.2 mL/min (A) (by C-G formula based on SCr of 5.07 mg/dL (H)).  Liver Function Tests: Recent Labs  Lab 06/28/17 0629 06/29/17 0926 06/30/17 2019  ALBUMIN 2.9* 2.9* 2.9*    Coagulation Profile: Recent Labs  Lab 06/28/17 0629 07/04/17 0751  INR 1.09 1.07    CBG:  Recent Labs  Lab 07/04/17 0558  GLUCAP 89     Recent Results (from the past 240 hour(s))  Surgical pcr screen     Status: None   Collection Time: 07/04/17  8:17 AM  Result Value Ref Range Status   MRSA, PCR NEGATIVE NEGATIVE Final   Staphylococcus aureus NEGATIVE NEGATIVE Final    Comment: (NOTE) The Xpert SA Assay (FDA approved for NASAL specimens in patients 64 years of age and older), is one component of a comprehensive surveillance program. It is not intended to diagnose infection nor to guide or monitor treatment. Performed at [facility], [address] [place],  [place] [address]       Radiology Studies: Dg Wrist 2 Views Left  Result Date: 07/03/2017 CLINICAL DATA:  Generalized left wrist pain since last night. No injury. EXAM: LEFT WRIST - 2 VIEW COMPARISON:  None. FINDINGS: Mild degenerate changes over the wrist. No acute fracture or dislocation. No bony erosions. Slight ulnar minus variance. IMPRESSION: No acute findings. Mild degenerate change of the wrist. Electronically Signed   By: Elberta Fortis M.D.   On: 07/03/2017 13:30     Medications:  Scheduled: . [MAR Hold] darbepoetin (ARANESP) injection - DIALYSIS  100 mcg Intravenous Q Tue-HD  . [MAR Hold] feeding supplement (NEPRO CARB STEADY)  237 mL Oral BID BM  . [MAR Hold] heparin  5,000 Units Subcutaneous Q8H  . [MAR Hold] hydroxychloroquine  200 mg Oral BID  . [MAR Hold] polyethylene glycol  17 g Oral BID  . [MAR Hold] sodium chloride flush  3 mL Intravenous Q12H   Continuous: . [MAR Hold] sodium chloride    . sodium chloride    . vancomycin     PRN:[MAR Hold] sodium chloride, [MAR Hold] acetaminophen **OR** [MAR Hold] acetaminophen, [MAR Hold] calcium carbonate (dosed in mg elemental calcium), [MAR Hold] camphor-menthol **AND** [MAR Hold] hydrOXYzine, [MAR Hold] docusate sodium, [MAR Hold] heparin, [MAR Hold] heparin, [MAR Hold] HYDROcodone-acetaminophen, [MAR Hold] ondansetron **OR** [MAR Hold] ondansetron (ZOFRAN) IV, [MAR Hold] sodium chloride flush, [MAR Hold] sorbitol, [MAR Hold] zolpidem  Assessment/Plan:      History of chronic kidney disease stage V now progressed to end-Stage Renal Disease  Patient with worsening symptoms over the last 1 month including shortness of breath and fatigue. ?She is thought to have progressed to end-stage renal disease. 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 ?Tunneled dialysis catheter was placed 5/14. ?Patient was started on hemodialysis. ?Nephrology continues to follow. ?Plan is for permanent access by vascular surgery today.  ?  Nausea Appears to have resolved. ?She is tolerating her diet. ?Possibly related to dialysis. ?  Left wrist pain No obvious deformity noted.  Symptoms have improved.  X-ray without any acute findings.  Patient reassured.     History of SLE Stable on Plaquenil. Continue.  Essential hypertension Patient developed orthostatic symptoms dialysis and her blood pressure was noted to be soft. So her antihypertensives including the losartan, furosemide, atenolol and amlodipine have been discontinued.  Blood pressures have stabilized.  Continue to monitor for now.    Anemia of chronic disease Stable no acute issues. ?No overt bleeding.   DVT Prophylaxis: Subcutaneous heparin    Code Status: Full code Family Communication: Discussed with the patient and her daughter Disposition Plan: Fistula creation on Monday.  It looks like next dialysis will be on Tuesday 5/21. Will need to be established with outpatient dialysis center.  PT recommends skilled nursing facility for short-term rehab.  Possible  discharge tomorrow after dialysis.    LOS: 7 days   [facility] Pager [phone] 07/04/2017, 12:07 PM  If 7PM-7AM, please contact night-coverage at [email or web address], password [facility]

## 2017-07-04 NOTE — Anesthesia Postprocedure Evaluation (Signed)
Anesthesia Post Note  Patient: Jenna [name]  Procedure(s) Performed: ARTERIOVENOUS (AV) FISTULA CREATION BRACHIOCEPHALIC (Left )     Patient location during evaluation: PACU Anesthesia Type: MAC Level of consciousness: awake and alert Pain management: pain level controlled Vital Signs Assessment: post-procedure vital signs reviewed and stable Respiratory status: spontaneous breathing, nonlabored ventilation, respiratory function stable and patient connected to nasal cannula oxygen Cardiovascular status: stable and blood pressure returned to baseline Postop Assessment: no apparent nausea or vomiting Anesthetic complications: no    Last Vitals:  Vitals:   07/04/17 1415 07/04/17 1430  BP: 125/65 118/65  Pulse: 68 67  Resp: 15 16  Temp: 36.7 C 36.7 C  SpO2: 100% 98%    Last Pain:  Vitals:   07/04/17 1430  TempSrc:   PainSc: 0-No pain                 Sreshta Cressler,JAMES TERRILL

## 2017-07-04 NOTE — Interval H&P Note (Signed)
History and Physical Interval Note:  07/04/2017 12:45 PM  Jenna [name]  has presented today for surgery, with the diagnosis of end stage renal disease  The various methods of treatment have been discussed with the patient and family. After consideration of risks, benefits and other options for treatment, the patient has consented to  Procedure(s): ARTERIOVENOUS (AV) FISTULA CREATION RADIOCEPHALIC VERSUS BRACHIOCEPHALIC (Left) as a surgical intervention .  The patient's history has been reviewed, patient examined, no change in status, stable for surgery.  I have reviewed the patient's chart and labs.  Questions were answered to the patient's satisfaction.     Curt Jews

## 2017-07-04 NOTE — Progress Notes (Addendum)
PT Cancellation Note  Patient Details Name: Jenna [name] MRN: [id number] DOB: 10/03/1955   Cancelled Treatment:    Reason Eval/Treat Not Completed: (P) Patient at procedure or test/unavailable Pt having IJ catheter placed . PT will follow back for treatment tomorrow.  Marcques Wrightsman B. Migdalia Dk PT, DPT Acute Rehabilitation  [phone] Pager [phone]     [place] 07/04/2017, 12:35 PM

## 2017-07-05 ENCOUNTER — Encounter (HOSPITAL_COMMUNITY): Payer: Self-pay | Admitting: Vascular Surgery

## 2017-07-05 ENCOUNTER — Telehealth: Payer: Self-pay | Admitting: Vascular Surgery

## 2017-07-05 DIAGNOSIS — I120 Hypertensive chronic kidney disease with stage 5 chronic kidney disease or end stage renal disease: Secondary | ICD-10-CM | POA: Diagnosis not present

## 2017-07-05 DIAGNOSIS — G4700 Insomnia, unspecified: Secondary | ICD-10-CM | POA: Diagnosis not present

## 2017-07-05 DIAGNOSIS — E669 Obesity, unspecified: Secondary | ICD-10-CM | POA: Diagnosis not present

## 2017-07-05 DIAGNOSIS — E876 Hypokalemia: Secondary | ICD-10-CM | POA: Diagnosis not present

## 2017-07-05 DIAGNOSIS — G459 Transient cerebral ischemic attack, unspecified: Secondary | ICD-10-CM | POA: Diagnosis not present

## 2017-07-05 DIAGNOSIS — I10 Essential (primary) hypertension: Secondary | ICD-10-CM | POA: Diagnosis not present

## 2017-07-05 DIAGNOSIS — N2581 Secondary hyperparathyroidism of renal origin: Secondary | ICD-10-CM | POA: Diagnosis not present

## 2017-07-05 DIAGNOSIS — M25532 Pain in left wrist: Secondary | ICD-10-CM | POA: Diagnosis not present

## 2017-07-05 DIAGNOSIS — M329 Systemic lupus erythematosus, unspecified: Secondary | ICD-10-CM | POA: Diagnosis not present

## 2017-07-05 DIAGNOSIS — D631 Anemia in chronic kidney disease: Secondary | ICD-10-CM | POA: Diagnosis not present

## 2017-07-05 DIAGNOSIS — Z992 Dependence on renal dialysis: Secondary | ICD-10-CM | POA: Diagnosis not present

## 2017-07-05 DIAGNOSIS — N186 End stage renal disease: Secondary | ICD-10-CM | POA: Diagnosis not present

## 2017-07-05 DIAGNOSIS — M6281 Muscle weakness (generalized): Secondary | ICD-10-CM | POA: Diagnosis not present

## 2017-07-05 DIAGNOSIS — N051 Unspecified nephritic syndrome with focal and segmental glomerular lesions: Secondary | ICD-10-CM | POA: Diagnosis not present

## 2017-07-05 DIAGNOSIS — D509 Iron deficiency anemia, unspecified: Secondary | ICD-10-CM | POA: Diagnosis not present

## 2017-07-05 DIAGNOSIS — M3215 Tubulo-interstitial nephropathy in systemic lupus erythematosus: Secondary | ICD-10-CM | POA: Diagnosis not present

## 2017-07-05 DIAGNOSIS — Z87448 Personal history of other diseases of urinary system: Secondary | ICD-10-CM | POA: Diagnosis not present

## 2017-07-05 DIAGNOSIS — M3214 Glomerular disease in systemic lupus erythematosus: Secondary | ICD-10-CM | POA: Diagnosis not present

## 2017-07-05 DIAGNOSIS — F419 Anxiety disorder, unspecified: Secondary | ICD-10-CM | POA: Diagnosis not present

## 2017-07-05 DIAGNOSIS — M545 Low back pain: Secondary | ICD-10-CM | POA: Diagnosis not present

## 2017-07-05 DIAGNOSIS — N185 Chronic kidney disease, stage 5: Secondary | ICD-10-CM | POA: Diagnosis not present

## 2017-07-05 LAB — CBC
HEMATOCRIT: 25.1 % — AB (ref 36.0–46.0)
HEMOGLOBIN: 7.9 g/dL — AB (ref 12.0–15.0)
MCH: 28.4 pg (ref 26.0–34.0)
MCHC: 31.5 g/dL (ref 30.0–36.0)
MCV: 90.3 fL (ref 78.0–100.0)
Platelets: 141 10*3/uL — ABNORMAL LOW (ref 150–400)
RBC: 2.78 MIL/uL — ABNORMAL LOW (ref 3.87–5.11)
RDW: 14.2 % (ref 11.5–15.5)
WBC: 5.6 10*3/uL (ref 4.0–10.5)

## 2017-07-05 LAB — RENAL FUNCTION PANEL
ALBUMIN: 2.9 g/dL — AB (ref 3.5–5.0)
ANION GAP: 13 (ref 5–15)
BUN: 46 mg/dL — AB (ref 6–20)
CHLORIDE: 96 mmol/L — AB (ref 101–111)
CO2: 24 mmol/L (ref 22–32)
Calcium: 8.2 mg/dL — ABNORMAL LOW (ref 8.9–10.3)
Creatinine, Ser: 5.7 mg/dL — ABNORMAL HIGH (ref 0.44–1.00)
GFR calc Af Amer: 8 mL/min — ABNORMAL LOW (ref >60)
GFR calc non Af Amer: 7 mL/min — ABNORMAL LOW (ref >60)
GLUCOSE: 96 mg/dL (ref 65–99)
PHOSPHORUS: 4.7 mg/dL — AB (ref 2.5–4.6)
POTASSIUM: 3.8 mmol/L (ref 3.5–5.1)
Sodium: 133 mmol/L — ABNORMAL LOW (ref 135–145)

## 2017-07-05 MED ORDER — HYDROCODONE-ACETAMINOPHEN 7.5-325 MG PO TABS: 1.0000 | ORAL_TABLET | Freq: Four times a day (QID) | ORAL | 0 refills | Status: DC | PRN

## 2017-07-05 MED ORDER — RENA-VITE PO TABS: 1.0000 | ORAL_TABLET | Freq: Every day | ORAL | 0 refills | Status: DC

## 2017-07-05 MED ORDER — POLYETHYLENE GLYCOL 3350 17 G PO PACK: 17.0000 g | PACK | Freq: Two times a day (BID) | ORAL | 0 refills | Status: DC

## 2017-07-05 MED ORDER — DARBEPOETIN ALFA 200 MCG/0.4ML IJ SOSY
PREFILLED_SYRINGE | INTRAMUSCULAR | Status: AC
  Administered 2017-07-05: 200 ug via INTRAVENOUS
  Filled 2017-07-05: qty 0.4

## 2017-07-05 MED ORDER — HEPARIN SODIUM (PORCINE) 1000 UNIT/ML DIALYSIS: 20.0000 [IU]/kg | INTRAMUSCULAR | Status: DC | PRN

## 2017-07-05 MED ORDER — NEPRO/CARBSTEADY PO LIQD: 237.0000 mL | Freq: Two times a day (BID) | ORAL | 0 refills | Status: DC

## 2017-07-05 MED ORDER — RENA-VITE PO TABS
1.0000 | ORAL_TABLET | Freq: Every day | ORAL | Status: DC
  Filled 2017-07-05: qty 1

## 2017-07-05 NOTE — Progress Notes (Signed)
  Progress Note    07/05/2017 9:39 AM 1 Day Post-Op  Subjective:  Dialysis via R IJ today;  She denies signs or symptoms of steal L hand   Vitals:   07/04/17 2028 07/05/17 0413  BP: 117/65 (!) 109/51  Pulse: 63 68  Resp: 17 17  Temp: 98.8 F (37.1 C) 99.1 F (37.3 C)  SpO2: 100% 100%   Physical Exam: Lungs:  Non labored Incisions:  L AC fossa incision with skin edges approximated well, no palpable hematoma Extremities:  Palpable L radial pulse; palpable thrill and audible bruit up to level of mid upper arm Abdomen:  Soft Neurologic: A&O  CBC    Component Value Date/Time   WBC 5.0 07/04/2017 0751   RBC 2.88 (L) 07/04/2017 0751   HGB 8.0 (L) 07/04/2017 0751   HCT 25.9 (L) 07/04/2017 0751   PLT 134 (L) 07/04/2017 0751   MCV 89.9 07/04/2017 0751   MCV 82.4 04/28/2011 1841   MCH 27.8 07/04/2017 0751   MCHC 30.9 07/04/2017 0751   RDW 14.1 07/04/2017 0751   LYMPHSABS 0.8 05/23/2014 1301   MONOABS 0.2 05/23/2014 1301   EOSABS 0.1 05/23/2014 1301   BASOSABS 0.0 05/23/2014 1301    BMET    Component Value Date/Time   NA 134 (L) 07/04/2017 0751   K 3.5 07/04/2017 0751   CL 94 (L) 07/04/2017 0751   CO2 26 07/04/2017 0751   GLUCOSE 102 (H) 07/04/2017 0751   BUN 36 (H) 07/04/2017 0751   CREATININE 5.07 (H) 07/04/2017 0751   CREATININE 2.11 (H) 05/23/2014 1301   CALCIUM 8.0 (L) 07/04/2017 0751   CALCIUM 6.9 (L) 01/23/2010 0616   GFRNONAA 8 (L) 07/04/2017 0751   GFRNONAA 25 (L) 05/23/2014 1301   GFRAA 10 (L) 07/04/2017 0751   GFRAA 29 (L) 05/23/2014 1301    INR    Component Value Date/Time   INR 1.07 07/04/2017 0751     Intake/Output Summary (Last 24 hours) at 07/05/2017 0939 Last data filed at 07/05/2017 0200 Gross per 24 hour  Intake 300 ml  Output 5 ml  Net 295 ml     Assessment/Plan:  62 y.o. female is s/p L brachiocephalic fistula 1 Day Post-Op   Patent L arm brachiocephalic fistula without symptoms of steal syndrome Fistula duplex in 4-6  weeks Ok for discharge from vascular standpoint  Dagoberto Ligas, PA-C [facility] [phone] 07/05/2017 9:39 AM

## 2017-07-05 NOTE — Progress Notes (Signed)
Report given to [facility] RN. Waiting for [facility] to transport.

## 2017-07-05 NOTE — Progress Notes (Signed)
PT Cancellation Note  Patient Details Name: Jenna [name] MRN: [id number] DOB: 11/16/1955   Cancelled Treatment:    Reason Eval/Treat Not Completed: Patient at procedure or test/unavailable   Currently in HD;  Will follow up later today as time allows;  Otherwise, will follow up for PT at a later date if she remains in the hospital;   Thank you,  Roney Marion, [place] Pager [phone] Office [phone]     [name] 07/05/2017, 2:18 PM

## 2017-07-05 NOTE — Procedures (Signed)
I have personally attended this patient's dialysis session.  Goal weight 98 kg TDC at 400 no issues BP stable For D/C to [facility] for rehab post HD  Jamal Maes, MD [place] Pager 07/05/2017, 1:33 PM

## 2017-07-05 NOTE — Progress Notes (Signed)
[facility] Rounding Note  Subjective:     Getting HD here today then for discharge to [facility] for Rehab Had L BC AVF 5/20 by Dr. Donnetta Hutching  Objective: Vital signs in last 24 hours: Vitals:   07/05/17 0950 07/05/17 1230 07/05/17 1245 07/05/17 1300  BP: (!) 105/57 120/69 (!) 102/53 (!) 94/58  Pulse: 60 64 60 61  Resp: $Remo'18 15 12 16  'RBxaN$ Temp: 98.2 F (36.8 C) 98.4 F (36.9 C)    TempSrc:  Oral    SpO2: 100% 100% 100% 97%  Weight:      Height:       Weight change: -0.777 kg (-1 lb 11.4 oz)  Intake/Output Summary (Last 24 hours) at 07/05/2017 1323 Last data filed at 07/05/2017 0944 Gross per 24 hour  Intake 440 ml  Output 5 ml  Net 435 ml    Physical Exam: General: obese, talkative, NAD Seen in HD Regular rhythm,  HR 60's (from 40's) Normal S1S2 Lungs clear Abdomen obese, not tender Legs are obese, tender, minimal edema R IJ TDC (5/14) and L BC AVF (5/20)   Recent Labs  Lab 06/29/17 0926 06/30/17 2019 07/04/17 0751  NA 138 137 134*  K 4.5 3.7 3.5  CL 107 103 94*  CO2 $Re'22 24 26  'Uwi$ GLUCOSE 89 112* 102*  BUN 71* 45* 36*  CREATININE 4.94* 4.51* 5.07*  CALCIUM 9.4 8.5* 8.0*  PHOS 4.7* 3.4  --     Recent Labs  Lab 06/29/17 0926 06/30/17 2019  ALBUMIN 2.9* 2.9*    Recent Labs  Lab 06/29/17 0925 06/30/17 2019 07/04/17 0751 07/05/17 1238  WBC 4.3 4.9 5.0 5.6  HGB 8.2* 8.3* 8.0* 7.9*  HCT 26.5* 26.1* 25.9* 25.1*  MCV 89.2 88.5 89.9 90.3  PLT 172 159 134* 141*    Recent Labs  Lab 07/04/17 0558  GLUCAP 89    Scheduled Medications: . darbepoetin (ARANESP) injection - DIALYSIS  200 mcg Intravenous Q Tue-HD  . feeding supplement (NEPRO CARB STEADY)  237 mL Oral BID BM  . heparin  5,000 Units Subcutaneous Q8H  . hydroxychloroquine  200 mg Oral BID  . multivitamin  1 tablet Oral QHS  . polyethylene glycol  17 g Oral BID  . sodium chloride flush  3 mL Intravenous Q12H    Assessment/Recommendations  1. 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  Progressive past year, Stage 5 pretty much since 02/2017. Clinically uremic. Needed to start dialysis.  1. S/p  [facility] 5/14, first HD 5/15, second 5/16, third 5/18 and 4th today 2. CLIPPED to [facility], can start on Thurs  3. Access wise has new L BC AVF done 5/20 by Dr. Donnetta Hutching 4. Post weight will be EDW. 2. Anemia - Fe studies ordered- sat 27%- started ferrlecit, started  Aranesp (100 mcg ordered [place] for 5/13; redose with next HD today 3. Secondary HPT - hypercalcemic at 10.8. Stopped outpt calcitriol - repeated PTH 45. Phos has been fine so far without binder 4. SLE - continue plaquenil 5. Hypertension - with symptomatic orthostasis and significant bradycardia on admission. All BP meds have been stopped. EDW looks to be about 98 6. Dispo - OK for D/C to [facility] post HD. Next HD Thursday at [facility] 7.  Jamal Maes, MD [place] Pager 07/05/2017, 1:23 PM

## 2017-07-05 NOTE — Clinical Social Work Placement (Addendum)
   CLINICAL SOCIAL WORK PLACEMENT  NOTE 07/05/17 - DISCHARGED TO [facility] VIA AMBULANCE  Date:  07/05/2017  Patient Details  Name: Jenna [name] MRN: [id number] Date of Birth: [birth year]  Clinical Social Work is seeking post-discharge placement for this patient at the [place] level of care (*CSW will initial, date and re-position this form in  chart as items are completed):      Patient/family provided with [place] Work Department's list of facilities offering this level of care within the geographic area requested by the patient (or if unable, by the patient's family).  Yes   Patient/family informed of their freedom to choose among providers that offer the needed level of care, that participate in Medicare, Medicaid or managed care program needed by the patient, have an available bed and are willing to accept the patient.      Patient/family informed of [facility]'s ownership interest in [facility] and [facility], as well as of the fact that they are under no obligation to receive care at these facilities.  PASRR submitted to EDS on 07/02/17     PASRR number received on 07/02/17     Existing PASRR number confirmed on       FL2 transmitted to all facilities in geographic area requested by pt/family on 07/02/17     FL2 transmitted to all facilities within larger geographic area on       Patient informed that his/her managed care company has contracts with or will negotiate with certain facilities, including the following:        Yes   Patient/family informed of bed offers received.  Patient chooses bed at       Physician recommends and patient chooses bed at      Patient to be transferred to [facility] on 07/05/17.  Patient to be transferred to facility by [place]     Patient family notified on 07/05/17 of transfer.  Name of family member notified:  Pietra Zuluaga - daughter; [phone] (mobile)      PHYSICIAN       Additional Comment:    _______________________________________________ Sable Feil, LCSW 07/05/2017, 5:23 PM

## 2017-07-05 NOTE — Telephone Encounter (Signed)
sch appt 08/09/17 1pm Dialysis Duplex 2pm P/O PA s/p L UE AVF

## 2017-07-05 NOTE — Plan of Care (Signed)
Nutrition Education Note  RD consulted for Renal Education. Provided handouts from the [employer]: Dietary Guidelines for Adults starting on Dialysis, Potassium and your CKD and Phosphorus and your CKD to patient/family. Reviewed food groups and provided written recommended serving sizes specifically determined for patient's current nutritional status.  Reviewed patients current lab values with pt; potassium and phosphorus at acceptable levels, corrected calcium 9.1 (acceptable).  Explained why diet restrictions are needed and provided lists of foods to limit/avoid that are high potassium, sodium, and phosphorus. Provided specific recommendations on safer alternatives of these foods. Strongly encouraged compliance of this diet.   Discussed importance of protein intake at each meal and snack. Provided examples of how to maximize protein intake throughout the day. Discussed need for fluid restriction with dialysis, importance of minimizing weight gain between HD treatments, and renal-friendly beverage options.  Encouraged pt to discuss specific diet questions/concerns with RD at HD outpatient facility. Teach back method used.  Expect good compliance.  Pt with orders to discharge today. Pt with no further questions at this time  Kerman Passey Ransom, [place], [place], [place] [phone] Pager  [phone] Weekend/On-Call Pager

## 2017-07-05 NOTE — Progress Notes (Signed)
Occupational Therapy Treatment Patient Details Name: Jenna [name] MRN: [id number] DOB: 09-Nov-1955 Today's Date: 07/05/2017    History of present illness Pt is a 62 y/o female admitted secondary to dizziness, fatigue, and hypotension. Found to have ESRD and is s/p R IJ HD catheter placement. Pt also began HD during admission. Pt now s/p L UE AV fistula placement 07/04/17. PMH includes HTN and lupus.    OT comments  Pt demonstrating some progress toward OT goals. She remains significantly limited by decreased activity tolerance for ADL and dizziness. She was able to progress duration of standing activities today ambulating to sink for grooming tasks requiring 3 therapeutic rest breaks during task. Pt encouraged to avoid pressure on L UE while using RW and she is able to demonstrate understanding. Pt would continue to benefit from short-term SNF placement post-acute D/C. OT will continue to follow while admitted.     Follow Up Recommendations  SNF;Supervision/Assistance - 24 hour    Equipment Recommendations  Other (comment)(TBD at next venue of care)    Recommendations for Other Services      Precautions / Restrictions Precautions Precautions: Fall Restrictions Weight Bearing Restrictions: No       Mobility Bed Mobility Overal bed mobility: Needs Assistance Bed Mobility: Supine to Sit     Supine to sit: Supervision Sit to supine: Mod assist   General bed mobility comments: Assist to manage BLE.   Transfers Overall transfer level: Needs assistance Equipment used: Rolling walker (2 wheeled) Transfers: Sit to/from Stand Sit to Stand: Min guard         General transfer comment: Min guard assist for safety. 3 sit<>stand performed prior to ambulating due to lightheadedness. Requiring cues to limit pushing with L UE s/p fistula placement.     Balance Overall balance assessment: Needs assistance Sitting-balance support: No upper extremity supported Sitting balance-Leahy  Scale: Good     Standing balance support: Single extremity supported;Bilateral upper extremity supported;During functional activity Standing balance-Leahy Scale: Poor Standing balance comment: Relies on external assist and UE support.                            ADL either performed or assessed with clinical judgement   ADL Overall ADL's : Needs assistance/impaired     Grooming: Min guard;Standing Grooming Details (indicate cue type and reason): Able to ambulate to sink with multiple therapeutic rest breaks. Able to briefly stand at sink with min guard but limited by lightheadedness and pt requiring seated rest break while brushing teeth. Able to stand to rinse and spit and then required another seated break prior to ambulating back to bed.                  Toilet Transfer: Min guard;Ambulation;RW;Minimal assistance Toilet Transfer Details (indicate cue type and reason): simulated with sit<>stand followed by functional mobility in room; initially using RW with L hand only resting for support with no pushing; able to ambulate without RW with min handheld assist at R UE for a few steps.          Functional mobility during ADLs: Minimal assistance General ADL Comments: Pt limited by poor activity tolerance for ADL. Educated pt concerning benefits of SNF level rehabilitation.      Vision       Perception     Praxis      Cognition Arousal/Alertness: Awake/alert Behavior During Therapy: WFL for tasks assessed/performed Overall Cognitive Status: Within Functional Limits  for tasks assessed                                          Exercises     Shoulder Instructions       General Comments Daughter initially present during session. However stepped out about halfway through.     Pertinent Vitals/ Pain       Pain Assessment: Faces Faces Pain Scale: Hurts little more Pain Location: L UE at site of new fistula.  Pain Descriptors / Indicators:  Aching;Discomfort Pain Intervention(s): Limited activity within patient's tolerance;Monitored during session(reinforced need to minimize use of L UE)  Home Living                                          Prior Functioning/Environment              Frequency  Min 2X/week        Progress Toward Goals  OT Goals(current goals can now be found in the care plan section)  Progress towards OT goals: Progressing toward goals  Acute Rehab OT Goals Patient Stated Goal: to get better  OT Goal Formulation: With patient Time For Goal Achievement: 07/14/17 Potential to Achieve Goals: Good  Plan Discharge plan remains appropriate;Equipment recommendations need to be updated    Co-evaluation                 AM-PAC PT "6 Clicks" Daily Activity     Outcome Measure   Help from another person eating meals?: None Help from another person taking care of personal grooming?: A Little Help from another person toileting, which includes using toliet, bedpan, or urinal?: A Little Help from another person bathing (including washing, rinsing, drying)?: A Little Help from another person to put on and taking off regular upper body clothing?: A Little Help from another person to put on and taking off regular lower body clothing?: A Little 6 Click Score: 19    End of Session Equipment Utilized During Treatment: Rolling walker  OT Visit Diagnosis: Muscle weakness (generalized) (M62.81)   Activity Tolerance Patient limited by fatigue   Patient Left in bed;with call bell/phone within reach;with bed alarm set;with family/visitor present   Nurse Communication Mobility status        Time: 4174-0814 OT Time Calculation (min): 26 min  Charges: OT General Charges $OT Visit: 1 Visit OT Treatments $Self Care/Home Management : 23-37 mins  Jenna Herrlich, MS OTR/L  Pager: [place] A Jenna [name] 07/05/2017, 11:52 AM

## 2017-07-05 NOTE — Discharge Summary (Signed)
[facility]  Physician Discharge Summary   Patient ID: Jenna [name] MRN: [id number] DOB/AGE: 1955-04-19 62 y.o.  Admit date: 06/27/2017 Discharge date: 07/05/2017  PCP: Rogers Blocker, MD  DISCHARGE DIAGNOSES:  End Stage renal disease  RECOMMENDATIONS FOR OUTPATIENT FOLLOW UP: 1. Outpatient hemodialysis on Tuesday Thursday Saturday schedule. 2. 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 Her primary nephrologist Dr. Florene Glen has been managing her renal function but over the last month she's had progressive shortness of breath with exertion. Associated with weakness. Patient has also noted some lower extremity edema, she was seen by the nephrologist in the office and sent here to prepare for dialysis commencement. Seen by nephrology. Dialysis catheter was placed. Patient started on hemodialysis.  Reason for Visit:End-stage renal disease  Consultants:Nephrology. Vascular surgery.  Procedures: Tunneled right IJ dialysis catheter  Hemodialysis  Left brachiocephalic AV fistula creation   HOSPITAL COURSE:   History of chronic kidney disease stage V now progressed to end-Stage Renal Disease  Patient with worsening symptoms over the last 1 month including shortness of breath and fatigue. ?She is thought to have progressed to end-stage renal disease. 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 ?Tunneled dialysis catheter was placed 5/14. ?Patient was started on hemodialysis.  AV fistula created by vascular  surgery on 5/20.  Patient has been established with [facility] dialysis center starting Thursday.  ?  Nausea Appears to have resolved. ?She is tolerating her diet. ?Possibly related to dialysis. ?  Left wrist pain No obvious deformity noted.  Symptoms have improved.  X-ray without any acute findings.  Patient reassured.     History of SLE Stable on Plaquenil. Continue.  Essential hypertension Patient developed orthostatic symptoms dialysis and her blood pressure was noted to be soft. So her antihypertensives including the losartan, furosemide, atenolol and amlodipine have been discontinued.  Blood pressures have stabilized.    Anemia of chronic disease Stable no acute issues. ?No overt bleeding.  Overall stable.  Okay for discharge to skilled nursing facility after dialysis today.    PERTINENT LABS:  The results of significant diagnostics from this hospitalization (including imaging, microbiology, ancillary and laboratory) are listed below for reference.    Microbiology: Recent Results (from the past 240 hour(s))  Surgical pcr screen     Status: None   Collection Time: 07/04/17  8:17 AM  Result Value Ref Range Status   MRSA, PCR NEGATIVE NEGATIVE Final   Staphylococcus aureus NEGATIVE NEGATIVE Final    Comment: (NOTE) The Xpert SA Assay (FDA approved for NASAL specimens in patients 66 years of age and older), is one component of a comprehensive surveillance program. It is not intended to diagnose infection nor to guide or monitor treatment. Performed at [facility], [place] [address] [place], [place] [address]      Labs: Basic Metabolic Panel: Recent Labs  Lab 06/29/17 0926 06/30/17 2019 07/04/17 0751  NA 138 137 134*  K 4.5 3.7 3.5  CL 107 103 94*  CO2 $Re'22 24 26  'ZOI$ GLUCOSE 89 112* 102*  BUN 71*  45* 36*  CREATININE 4.94* 4.51* 5.07*  CALCIUM 9.4 8.5* 8.0*  PHOS 4.7* 3.4  --    Liver Function Tests: Recent Labs  Lab 06/29/17 0926 06/30/17 2019  ALBUMIN  2.9* 2.9*   CBC: Recent Labs  Lab 06/29/17 0925 06/30/17 2019 07/04/17 0751  WBC 4.3 4.9 5.0  HGB 8.2* 8.3* 8.0*  HCT 26.5* 26.1* 25.9*  MCV 89.2 88.5 89.9  PLT 172 159 134*   BNP: BNP (last 3 results) Recent Labs    06/27/17 1552  BNP 338.0*    CBG: Recent Labs  Lab 07/04/17 0558  GLUCAP 89     IMAGING STUDIES Dg Chest 2 View  Result Date: 06/27/2017 CLINICAL DATA:  Exertional dyspnea. Orthostatic hypotension. Dizziness and fatigue. EXAM: CHEST - 2 VIEW COMPARISON:  06/23/2017 and 06/03/2014 FINDINGS: There is mild cardiomegaly. Pulmonary vascularity is normal and the lungs are clear except for a small area of linear scarring at the right lung base, unchanged since 2016. No effusions. Bones are normal. IMPRESSION: Stable mild cardiomegaly.  No acute abnormality. Electronically Signed   By: Lorriane Shire M.D.   On: 06/27/2017 16:51   Dg Chest 2 View  Result Date: 06/23/2017 CLINICAL DATA:  Per EMS, pt was at rehab for falls when she had a new onset of bradycardia in the 40s. Left bundle branch block, sinus brady per EMS EKG. Pt reported intermittent left arm pain. EXAM: CHEST - 2 VIEW COMPARISON:  02/09/2017 FINDINGS: The cardiopericardial silhouette is enlarged and increased in prominence compared to prior studies but also accentuated by the AP position. There is no pulmonary edema. There are no focal consolidations or pleural effusions. Visualized osseous structures have a normal appearance. IMPRESSION: Enlarged cardiopericardial silhouette without edema. Findings raise the question of pericardial effusion. Electronically Signed   By: Nolon Nations M.D.   On: 06/23/2017 17:09   Dg Wrist 2 Views Left  Result Date: 07/03/2017 CLINICAL DATA:  Generalized left wrist pain since last night. No injury. EXAM: LEFT WRIST - 2 VIEW COMPARISON:  None. FINDINGS: Mild degenerate changes over the wrist. No acute fracture or dislocation. No bony erosions. Slight ulnar minus variance.  IMPRESSION: No acute findings. Mild degenerate change of the wrist. Electronically Signed   By: Marin Olp M.D.   On: 07/03/2017 13:30   Ir Fluoro Guide Cv Line Right  Result Date: 06/28/2017 INDICATION: END-STAGE RENAL DISEASE, NO CURRENT ACCESS FOR DIALYSIS EXAM: ULTRASOUND GUIDANCE FOR VASCULAR ACCESS RIGHT INTERNAL JUGULAR PERMANENT HEMODIALYSIS CATHETER Date:  06/28/2017 06/28/2017 3:21 pm Radiologist:  Jerilynn Mages. Daryll Brod, MD Guidance:  Ultrasound and fluoroscopic FLUOROSCOPY TIME:  Fluoroscopy Time:  18 seconds (1 mGy). MEDICATIONS: Vancomycin 1 g image are within 1 of the procedure ANESTHESIA/SEDATION: Versed 1.5 mg IV; Fentanyl 70 mcg IV; Moderate Sedation Time:  13 minutes The patient was continuously monitored during the procedure by the interventional radiology nurse under my direct supervision. CONTRAST:  None. COMPLICATIONS: None immediate. PROCEDURE: Informed consent was obtained from the patient following explanation of the procedure, risks, benefits and alternatives. The patient understands, agrees and consents for the procedure. All questions were addressed. A time out was performed. Maximal barrier sterile technique utilized including caps, mask, sterile gowns, sterile gloves, large sterile drape, hand hygiene, and 2% chlorhexidine scrub. Under sterile conditions and local anesthesia, right internal jugular micropuncture venous access was performed with ultrasound. Images were obtained for documentation of the patent right internal jugular vein. A guide wire was inserted followed by a transitional dilator. Next, a  0.035 guidewire was advanced into the IVC with a 5-French catheter. Measurements were obtained from the right venotomy site to the proximal right atrium. In the right infraclavicular chest, a subcutaneous tunnel was created under sterile conditions and local anesthesia. 1% lidocaine with epinephrine was utilized for this. The 19 cm tip to cuff palindrome catheter was tunneled  subcutaneously to the venotomy site and inserted into the SVC/RA junction through a valved peel-away sheath. Position was confirmed with fluoroscopy. Images were obtained for documentation. Blood was aspirated from the catheter followed by saline and heparin flushes. The appropriate volume and strength of heparin was instilled in each lumen. Caps were applied. The catheter was secured at the tunnel site with Gelfoam and a pursestring suture. The venotomy site was closed with subcuticular Vicryl suture. Dermabond was applied to the small right neck incision. A dry sterile dressing was applied. The catheter is ready for use. No immediate complications. IMPRESSION: Ultrasound and fluoroscopically guided right internal jugular tunneled hemodialysis catheter (19 cm tip to cuff palindrome catheter). Electronically Signed   By: Jerilynn Mages.  Shick M.D.   On: 06/28/2017 15:23   Ir US Guide Vasc Access Right  Result Date: 06/28/2017 INDICATION: END-STAGE RENAL DISEASE, NO CURRENT ACCESS FOR DIALYSIS EXAM: ULTRASOUND GUIDANCE FOR VASCULAR ACCESS RIGHT INTERNAL JUGULAR PERMANENT HEMODIALYSIS CATHETER Date:  06/28/2017 06/28/2017 3:21 pm Radiologist:  Jerilynn Mages. Daryll Brod, MD Guidance:  Ultrasound and fluoroscopic FLUOROSCOPY TIME:  Fluoroscopy Time:  18 seconds (1 mGy). MEDICATIONS: Vancomycin 1 g image are within 1 of the procedure ANESTHESIA/SEDATION: Versed 1.5 mg IV; Fentanyl 70 mcg IV; Moderate Sedation Time:  13 minutes The patient was continuously monitored during the procedure by the interventional radiology nurse under my direct supervision. CONTRAST:  None. COMPLICATIONS: None immediate. PROCEDURE: Informed consent was obtained from the patient following explanation of the procedure, risks, benefits and alternatives. The patient understands, agrees and consents for the procedure. All questions were addressed. A time out was performed. Maximal barrier sterile technique utilized including caps, mask, sterile gowns, sterile gloves,  large sterile drape, hand hygiene, and 2% chlorhexidine scrub. Under sterile conditions and local anesthesia, right internal jugular micropuncture venous access was performed with ultrasound. Images were obtained for documentation of the patent right internal jugular vein. A guide wire was inserted followed by a transitional dilator. Next, a 0.035 guidewire was advanced into the IVC with a 5-French catheter. Measurements were obtained from the right venotomy site to the proximal right atrium. In the right infraclavicular chest, a subcutaneous tunnel was created under sterile conditions and local anesthesia. 1% lidocaine with epinephrine was utilized for this. The 19 cm tip to cuff palindrome catheter was tunneled subcutaneously to the venotomy site and inserted into the SVC/RA junction through a valved peel-away sheath. Position was confirmed with fluoroscopy. Images were obtained for documentation. Blood was aspirated from the catheter followed by saline and heparin flushes. The appropriate volume and strength of heparin was instilled in each lumen. Caps were applied. The catheter was secured at the tunnel site with Gelfoam and a pursestring suture. The venotomy site was closed with subcuticular Vicryl suture. Dermabond was applied to the small right neck incision. A dry sterile dressing was applied. The catheter is ready for use. No immediate complications. IMPRESSION: Ultrasound and fluoroscopically guided right internal jugular tunneled hemodialysis catheter (19 cm tip to cuff palindrome catheter). Electronically Signed   By: Jerilynn Mages.  Shick M.D.   On: 06/28/2017 15:23    DISCHARGE EXAMINATION: Vitals:   07/04/17 1532 07/04/17 2028 07/05/17  0413 07/05/17 0950  BP: 118/61 117/65 (!) 109/51 (!) 105/57  Pulse: 66 63 68 60  Resp: $Remo'16 17 17 18  'Gpyne$ Temp: 98.9 F (37.2 C) 98.8 F (37.1 C) 99.1 F (37.3 C) 98.2 F (36.8 C)  TempSrc: Oral Oral Oral   SpO2: 100% 100% 100% 100%  Weight:  97.5 kg (215 lb)    Height:        General appearance: alert, cooperative, appears stated age and no distress Resp: clear to auscultation bilaterally Cardio: regular rate and rhythm, S1, S2 normal, no murmur, click, rub or gallop GI: soft, non-tender; bowel sounds normal; no masses,  no organomegaly  DISPOSITION: SNF  Discharge Instructions    Call MD for:  extreme fatigue   Complete by:  As directed    Call MD for:  persistant dizziness or light-headedness   Complete by:  As directed    Call MD for:  persistant nausea and vomiting   Complete by:  As directed    Call MD for:  severe uncontrolled pain   Complete by:  As directed    Call MD for:  temperature >100.4   Complete by:  As directed    Discharge instructions   Complete by:  As directed    Please review instructions on the discharge summary  You were cared for by a hospitalist during your hospital stay. If you have any questions about your discharge medications or the care you received while you were in the hospital after you are discharged, you can call the unit and asked to speak with the hospitalist on call if the hospitalist that took care of you is not available. Once you are discharged, your primary care physician will handle any further medical issues. Please note that NO REFILLS for any discharge medications will be authorized once you are discharged, as it is imperative that you return to your primary care physician (or establish a relationship with a primary care physician if you do not have one) for your aftercare needs so that they can reassess your need for medications and monitor your lab values. If you do not have a primary care physician, you can call [phone] for a physician referral.   Increase activity slowly   Complete by:  As directed         Allergies as of 07/05/2017      Reactions   Enalapril Maleate Other (See Comments)   Throat swelling   Chocolate Nausea And Vomiting   Penicillins Other (See Comments)   Has patient had a PCN  reaction causing immediate rash, facial/tongue/throat swelling, SOB or lightheadedness with hypotension: No Has patient had a PCN reaction causing severe rash involving mucus membranes or skin necrosis: No Has patient had a PCN reaction that required hospitalization: No Has patient had a PCN reaction occurring within the last 10 years: No If all of the above answers are "NO", then may proceed with Cephalosporin use.      Medication List    STOP taking these medications   amLODipine 10 MG tablet Commonly known as:  NORVASC   atenolol 100 MG tablet Commonly known as:  TENORMIN   benzonatate 100 MG capsule Commonly known as:  TESSALON   calcitRIOL 0.25 MCG capsule Commonly known as:  ROCALTROL   cholecalciferol 1000 units tablet Commonly known as:  VITAMIN D   fluticasone 50 MCG/ACT nasal spray Commonly known as:  FLONASE   furosemide 40 MG tablet Commonly known as:  LASIX   hydrocortisone cream  0.5 %   losartan 100 MG tablet Commonly known as:  COZAAR   MULTIVITAMIN & MINERAL PO     TAKE these medications   acetaminophen 500 MG tablet Commonly known as:  TYLENOL Take 1,000 mg by mouth every 6 (six) hours as needed for headache.   aspirin 81 MG tablet Take 1 tablet (81 mg total) by mouth daily.   calcium carbonate 500 MG chewable tablet Commonly known as:  TUMS - dosed in mg elemental calcium Chew 1 tablet by mouth daily as needed for indigestion or heartburn.   feeding supplement (NEPRO CARB STEADY) Liqd Take 237 mLs by mouth 2 (two) times daily between meals.   HYDROcodone-acetaminophen 7.5-325 MG tablet Commonly known as:  NORCO Take 1 tablet by mouth every 6 (six) hours as needed for moderate pain or severe pain.   hydroxychloroquine 200 MG tablet Commonly known as:  PLAQUENIL Take 1 tablet (200 mg total) by mouth 2 (two) times daily.   multivitamin Tabs tablet Take 1 tablet by mouth at bedtime.   polyethylene glycol packet Commonly known as:   MIRALAX / GLYCOLAX Take 17 g by mouth 2 (two) times daily.            Durable Medical Equipment  (From admission, onward)        Start     Ordered   06/29/17 1016  For home use only DME 4 wheeled rolling walker with seat  Once    Question:  Patient needs a walker to treat with the following condition  Answer:  End stage renal disease ([place])   06/29/17 1016       Follow-up Information    Rogers Blocker, MD Follow up.   Specialty:  Internal Medicine Contact information: [place] [address] [address] Follow up.   Why:  Arranged for Rollator to be delivered to patient room prior to discharge Contact information: [address] [place] [address] [phone]        [name], MD In 6 weeks.   Specialties:  Vascular Surgery, Cardiology Why:  Office will call you to arrange your appt (sent) Contact information: [address] [place] [address] [phone]           TOTAL DISCHARGE TIME: 2 mins  [facility] Pager [phone]  07/05/2017, 12:09 PM

## 2017-07-05 NOTE — Telephone Encounter (Signed)
-----   Message from Penni Homans, RN sent at 07/04/2017  2:40 PM EDT ----- Regarding: Appointment   ----- Message ----- From: Ulyses Amor, PA-C Sent: 07/04/2017   2:08 PM To: Vvs Charge Pool  S/P left UE AV fistula f/u in [facility] in 5-6 weeks with fistula duplex

## 2017-07-06 NOTE — Progress Notes (Signed)
[facility] unable to transport pt to [facility] at this time. Pt and family do not wish to wait. [place] notified. Order received to let pt transport via family. [facility] notified. Pt taken via wheelchair, to car,with all belongings.

## 2017-07-07 DIAGNOSIS — D509 Iron deficiency anemia, unspecified: Secondary | ICD-10-CM | POA: Diagnosis not present

## 2017-07-07 DIAGNOSIS — D631 Anemia in chronic kidney disease: Secondary | ICD-10-CM | POA: Diagnosis not present

## 2017-07-07 DIAGNOSIS — N186 End stage renal disease: Secondary | ICD-10-CM | POA: Diagnosis not present

## 2017-07-08 DIAGNOSIS — N186 End stage renal disease: Secondary | ICD-10-CM | POA: Diagnosis not present

## 2017-07-08 DIAGNOSIS — I10 Essential (primary) hypertension: Secondary | ICD-10-CM | POA: Diagnosis not present

## 2017-07-08 DIAGNOSIS — M329 Systemic lupus erythematosus, unspecified: Secondary | ICD-10-CM | POA: Diagnosis not present

## 2017-07-09 DIAGNOSIS — N186 End stage renal disease: Secondary | ICD-10-CM | POA: Diagnosis not present

## 2017-07-09 DIAGNOSIS — D631 Anemia in chronic kidney disease: Secondary | ICD-10-CM | POA: Diagnosis not present

## 2017-07-09 DIAGNOSIS — D509 Iron deficiency anemia, unspecified: Secondary | ICD-10-CM | POA: Diagnosis not present

## 2017-07-12 DIAGNOSIS — G4700 Insomnia, unspecified: Secondary | ICD-10-CM | POA: Diagnosis not present

## 2017-07-12 DIAGNOSIS — N186 End stage renal disease: Secondary | ICD-10-CM | POA: Diagnosis not present

## 2017-07-12 DIAGNOSIS — D631 Anemia in chronic kidney disease: Secondary | ICD-10-CM | POA: Diagnosis not present

## 2017-07-12 DIAGNOSIS — D509 Iron deficiency anemia, unspecified: Secondary | ICD-10-CM | POA: Diagnosis not present

## 2017-07-12 DIAGNOSIS — F419 Anxiety disorder, unspecified: Secondary | ICD-10-CM | POA: Diagnosis not present

## 2017-07-13 ENCOUNTER — Other Ambulatory Visit: Payer: Self-pay

## 2017-07-13 DIAGNOSIS — N186 End stage renal disease: Secondary | ICD-10-CM | POA: Diagnosis not present

## 2017-07-13 DIAGNOSIS — Z992 Dependence on renal dialysis: Secondary | ICD-10-CM | POA: Diagnosis not present

## 2017-07-13 DIAGNOSIS — G4700 Insomnia, unspecified: Secondary | ICD-10-CM | POA: Diagnosis not present

## 2017-07-13 DIAGNOSIS — M6281 Muscle weakness (generalized): Secondary | ICD-10-CM | POA: Diagnosis not present

## 2017-07-13 DIAGNOSIS — M3215 Tubulo-interstitial nephropathy in systemic lupus erythematosus: Secondary | ICD-10-CM | POA: Diagnosis not present

## 2017-07-13 DIAGNOSIS — N185 Chronic kidney disease, stage 5: Secondary | ICD-10-CM

## 2017-07-13 DIAGNOSIS — Z48812 Encounter for surgical aftercare following surgery on the circulatory system: Secondary | ICD-10-CM

## 2017-07-14 DIAGNOSIS — D509 Iron deficiency anemia, unspecified: Secondary | ICD-10-CM | POA: Diagnosis not present

## 2017-07-14 DIAGNOSIS — D631 Anemia in chronic kidney disease: Secondary | ICD-10-CM | POA: Diagnosis not present

## 2017-07-14 DIAGNOSIS — N186 End stage renal disease: Secondary | ICD-10-CM | POA: Diagnosis not present

## 2017-07-15 DIAGNOSIS — G4700 Insomnia, unspecified: Secondary | ICD-10-CM | POA: Diagnosis not present

## 2017-07-15 DIAGNOSIS — N186 End stage renal disease: Secondary | ICD-10-CM | POA: Diagnosis not present

## 2017-07-15 DIAGNOSIS — M329 Systemic lupus erythematosus, unspecified: Secondary | ICD-10-CM | POA: Diagnosis not present

## 2017-07-15 DIAGNOSIS — M6281 Muscle weakness (generalized): Secondary | ICD-10-CM | POA: Diagnosis not present

## 2017-07-16 DIAGNOSIS — D509 Iron deficiency anemia, unspecified: Secondary | ICD-10-CM | POA: Diagnosis not present

## 2017-07-16 DIAGNOSIS — D631 Anemia in chronic kidney disease: Secondary | ICD-10-CM | POA: Diagnosis not present

## 2017-07-16 DIAGNOSIS — M3214 Glomerular disease in systemic lupus erythematosus: Secondary | ICD-10-CM | POA: Diagnosis not present

## 2017-07-16 DIAGNOSIS — Z992 Dependence on renal dialysis: Secondary | ICD-10-CM | POA: Diagnosis not present

## 2017-07-16 DIAGNOSIS — E876 Hypokalemia: Secondary | ICD-10-CM | POA: Diagnosis not present

## 2017-07-16 DIAGNOSIS — N186 End stage renal disease: Secondary | ICD-10-CM | POA: Diagnosis not present

## 2017-07-18 DIAGNOSIS — M6281 Muscle weakness (generalized): Secondary | ICD-10-CM | POA: Diagnosis not present

## 2017-07-18 DIAGNOSIS — N186 End stage renal disease: Secondary | ICD-10-CM | POA: Diagnosis not present

## 2017-07-18 DIAGNOSIS — M329 Systemic lupus erythematosus, unspecified: Secondary | ICD-10-CM | POA: Diagnosis not present

## 2017-07-18 DIAGNOSIS — F419 Anxiety disorder, unspecified: Secondary | ICD-10-CM | POA: Diagnosis not present

## 2017-07-19 DIAGNOSIS — D631 Anemia in chronic kidney disease: Secondary | ICD-10-CM | POA: Diagnosis not present

## 2017-07-19 DIAGNOSIS — D509 Iron deficiency anemia, unspecified: Secondary | ICD-10-CM | POA: Diagnosis not present

## 2017-07-19 DIAGNOSIS — N186 End stage renal disease: Secondary | ICD-10-CM | POA: Diagnosis not present

## 2017-07-19 DIAGNOSIS — E876 Hypokalemia: Secondary | ICD-10-CM | POA: Diagnosis not present

## 2017-07-21 DIAGNOSIS — E876 Hypokalemia: Secondary | ICD-10-CM | POA: Diagnosis not present

## 2017-07-21 DIAGNOSIS — N186 End stage renal disease: Secondary | ICD-10-CM | POA: Diagnosis not present

## 2017-07-21 DIAGNOSIS — D509 Iron deficiency anemia, unspecified: Secondary | ICD-10-CM | POA: Diagnosis not present

## 2017-07-21 DIAGNOSIS — D631 Anemia in chronic kidney disease: Secondary | ICD-10-CM | POA: Diagnosis not present

## 2017-07-22 DIAGNOSIS — N186 End stage renal disease: Secondary | ICD-10-CM | POA: Diagnosis not present

## 2017-07-22 DIAGNOSIS — Z992 Dependence on renal dialysis: Secondary | ICD-10-CM | POA: Diagnosis not present

## 2017-07-22 DIAGNOSIS — M3215 Tubulo-interstitial nephropathy in systemic lupus erythematosus: Secondary | ICD-10-CM | POA: Diagnosis not present

## 2017-07-22 DIAGNOSIS — I120 Hypertensive chronic kidney disease with stage 5 chronic kidney disease or end stage renal disease: Secondary | ICD-10-CM | POA: Diagnosis not present

## 2017-07-22 DIAGNOSIS — D631 Anemia in chronic kidney disease: Secondary | ICD-10-CM | POA: Diagnosis not present

## 2017-07-22 DIAGNOSIS — Z9181 History of falling: Secondary | ICD-10-CM | POA: Diagnosis not present

## 2017-07-23 DIAGNOSIS — D631 Anemia in chronic kidney disease: Secondary | ICD-10-CM | POA: Diagnosis not present

## 2017-07-23 DIAGNOSIS — N186 End stage renal disease: Secondary | ICD-10-CM | POA: Diagnosis not present

## 2017-07-23 DIAGNOSIS — D509 Iron deficiency anemia, unspecified: Secondary | ICD-10-CM | POA: Diagnosis not present

## 2017-07-23 DIAGNOSIS — E876 Hypokalemia: Secondary | ICD-10-CM | POA: Diagnosis not present

## 2017-07-25 ENCOUNTER — Telehealth: Payer: Self-pay | Admitting: Family Medicine

## 2017-07-25 DIAGNOSIS — Z9181 History of falling: Secondary | ICD-10-CM | POA: Diagnosis not present

## 2017-07-25 DIAGNOSIS — D631 Anemia in chronic kidney disease: Secondary | ICD-10-CM | POA: Diagnosis not present

## 2017-07-25 DIAGNOSIS — I120 Hypertensive chronic kidney disease with stage 5 chronic kidney disease or end stage renal disease: Secondary | ICD-10-CM | POA: Diagnosis not present

## 2017-07-25 DIAGNOSIS — M3215 Tubulo-interstitial nephropathy in systemic lupus erythematosus: Secondary | ICD-10-CM | POA: Diagnosis not present

## 2017-07-25 DIAGNOSIS — Z992 Dependence on renal dialysis: Secondary | ICD-10-CM | POA: Diagnosis not present

## 2017-07-25 DIAGNOSIS — N186 End stage renal disease: Secondary | ICD-10-CM | POA: Diagnosis not present

## 2017-07-25 NOTE — Telephone Encounter (Signed)
Spoke with Jenna [name], she is aware pt will need to be seen in office before any orders can be placed. She asked if orders could be called in once pt has been seen.

## 2017-07-25 NOTE — Telephone Encounter (Signed)
Copied from [place] [id number]. Topic: Quick Communication - See Telephone Encounter >> Jul 25, 2017  4:29 PM Bea Graff, NT wrote: CRM for notification. See Telephone encounter for: 07/25/17. Anda Kraft with [facility] calling to get verbal orders for PT for this pt for 2 times a week for 8 weeks. CB#: [phone]. She states she is aware doctor may need to see pt before giving the ok.

## 2017-07-26 DIAGNOSIS — N186 End stage renal disease: Secondary | ICD-10-CM | POA: Diagnosis not present

## 2017-07-26 DIAGNOSIS — D631 Anemia in chronic kidney disease: Secondary | ICD-10-CM | POA: Diagnosis not present

## 2017-07-26 DIAGNOSIS — E876 Hypokalemia: Secondary | ICD-10-CM | POA: Diagnosis not present

## 2017-07-26 DIAGNOSIS — D509 Iron deficiency anemia, unspecified: Secondary | ICD-10-CM | POA: Diagnosis not present

## 2017-07-26 NOTE — Telephone Encounter (Signed)
No.  This is not my pt.

## 2017-07-26 NOTE — Telephone Encounter (Signed)
Please Advise if ok to give PT verbal orders for this pt

## 2017-07-27 ENCOUNTER — Ambulatory Visit (INDEPENDENT_AMBULATORY_CARE_PROVIDER_SITE_OTHER): Payer: Medicare Other | Admitting: Family Medicine

## 2017-07-27 ENCOUNTER — Encounter: Payer: Self-pay | Admitting: Family Medicine

## 2017-07-27 VITALS — BP 118/72 | HR 78 | Temp 98.4°F | Ht 66.0 in | Wt 214.0 lb

## 2017-07-27 DIAGNOSIS — M329 Systemic lupus erythematosus, unspecified: Secondary | ICD-10-CM

## 2017-07-27 DIAGNOSIS — I120 Hypertensive chronic kidney disease with stage 5 chronic kidney disease or end stage renal disease: Secondary | ICD-10-CM | POA: Diagnosis not present

## 2017-07-27 DIAGNOSIS — D631 Anemia in chronic kidney disease: Secondary | ICD-10-CM | POA: Diagnosis not present

## 2017-07-27 DIAGNOSIS — Z992 Dependence on renal dialysis: Secondary | ICD-10-CM | POA: Diagnosis not present

## 2017-07-27 DIAGNOSIS — N186 End stage renal disease: Secondary | ICD-10-CM | POA: Diagnosis not present

## 2017-07-27 DIAGNOSIS — Z7689 Persons encountering health services in other specified circumstances: Secondary | ICD-10-CM | POA: Diagnosis not present

## 2017-07-27 DIAGNOSIS — Z9181 History of falling: Secondary | ICD-10-CM | POA: Diagnosis not present

## 2017-07-27 DIAGNOSIS — M3215 Tubulo-interstitial nephropathy in systemic lupus erythematosus: Secondary | ICD-10-CM | POA: Diagnosis not present

## 2017-07-27 NOTE — Patient Instructions (Signed)
Hemodialysis °Dialysis is a procedure that replaces some of the work that healthy kidneys do. During dialysis, wastes, salt, and extra water are removed from the blood, and the level of certain important minerals in the blood (such as potassium) is maintained. It is typically started when you have lost about 85-90% of your kidney function, or earlier if it may help relieve your symptoms. °Hemodialysis is a type of dialysis in which a machine called a dialyzer is used to filter the blood. Hemodialysis is usually performed by a health care provider at a hospital or dialysis center 3 times a week for 3-5 hours during each visit. It may also be performed on a different schedule at home with training. °Tell a health care provider about: °· Any allergies you have. °· All medicines you are taking, including vitamins, herbs, eye drops, creams, and over-the-counter medicines. °· Any blood disorders you have. °· Any medical conditions you have. °What are the risks? °Generally, this is a safe procedure. However, problems may occur, including: °· Low blood pressure (hypotension). °· Narrowing or ballooning of a blood vessel, or bleeding at the site where blood is removed from the body and returned to the body (vascular access). °· An infection or blockage at the vascular access site. °· Allergic reaction to chemicals used to sterilize the dialyzer. ° °What happens before the procedure? °Before having hemodialysis for the first time, you will need surgery or a procedure to create a vascular access site. There are three types of vascular access: °· Arteriovenous fistula. This type of access is created when an artery and a vein (usually in the arm) are connected by a surgical procedure. The arteriovenous fistula usually takes 1-6 months to develop after surgery. °· Arteriovenous graft. This type of access is created when a tube is used to connect an artery and a vein in the arm. An arteriovenous graft can usually be used within  2-3 weeks of surgery. °· A venous catheter. To create this type of access, a thin, flexible tube (catheter) is placed in a large vein in your neck, chest, or groin. A venous catheter can be used right away. ° °What happens during the procedure? °· Your weight, blood pressure, pulse, and temperature will be measured. °· The skin around your vascular access will be cleaned (sterilized) to get rid of germs. °· Your vascular access will be connected to the dialyzer. °? If your access is a fistula or graft, two needles will be inserted through the vascular access. The needles will be connected to a plastic tube that is connected to the dialyzer. They will be taped to your skin so that they do not move out of place. °? If your access is a catheter, it will be connected to a plastic tube that is then connected to the dialyzer. °· The dialysis machine will be turned on. This will cause your blood to flow to the dialyzer. The dialyzer will filter and clean your blood before returning it to your body. While the dialysis machine is working, your blood pressure and pulse will be checked several times. °· Once dialysis is complete, your vascular access will be disconnected from the dialyzer. If your access is a fistula or graft, the needles will be removed and a bandage (dressing) will be placed over the access. °Hemodialysis is performed while you are sitting or reclining. During the procedure, you may sleep, read, watch television, or perform any other tasks that can be done while you are in this position.   If you experience side effects or any other discomfort during the procedure, let your health care provider know. He or she may be able to make adjustments to help you feel more comfortable. °The procedure may vary among health care providers and hospitals. °What happens after the procedure? °· Your weight will be measured. °· Your blood may be tested to see whether your treatments are removing enough wastes. This is usually  done once a month. °· You may experience or continue to experience side effects, including: °? Dizziness. °? Muscle cramps. °? Nausea. °? Headaches. °? Allergic reaction to the chemicals used to sterilize the dialyzer. °Summary °· Dialysis is a procedure that replaces some of the work that healthy kidneys do. During dialysis, wastes, salt, and extra water are removed from the blood, and the level of certain important minerals in the blood is maintained. °· Hemodialysis is a type of dialysis in which a machine called a dialyzer is used to filter the blood. It is usually performed by a health care provider at a hospital or dialysis center 3 times a week for 3-5 hours during each visit. °· Before having hemodialysis for the first time, you will need surgery or a procedure to create a vascular access. °· During hemodialysis, your vascular access will be connected to the dialyzer. The dialyzer will filter and clean your blood before returning it to your body. °This information is not intended to replace advice given to you by your health care provider. Make sure you discuss any questions you have with your health care provider. °Document Released: 05/29/2012 Document Revised: 03/09/2016 Document Reviewed: 03/09/2016 °[employer] Interactive Patient Education © 2018 [employer] ° °

## 2017-07-27 NOTE — Progress Notes (Signed)
Patient presents to clinic today to establish care/ f/u on chronic issues.  Pt was dropped off by her daughter.  Pt's daughter calls during visit to check on pt and to state she will be waiting in the car.  SUBJECTIVE: PMH: Pt is a 62 yo female with pmh sig for ESRD on HD (T,Th, Sa), lupus.  Pt is followed by numerous specialist.  Pt was previously seen by Dr. Doreene Burke.  When asked specific questions pt states "its in the chart".  No new pt paperwork completed--pt states she lost it.    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  Recently started x 1 mo. -pt has port in R chest and AV fistula in L arm -pt states she was being followed by Dr. Florene Glen but is "upset with him" and does not want to talk about dialysis.  Pt states she will have a new provider soon. -still making some urine.  Lupus: -dx'd 14 yrs ago -followed by Dr. Leigh Aurora  Allergies: Penicillin-dizzy "out of this world" Chocolate-"make sick"  Social history: Patient denies alcohol, tobacco, drug use.  After given AVS by CMA, pt states she has not seen the doctor.  Past Medical History:  Diagnosis Date  . CKD (chronic kidney disease) stage 5, GFR less than 15 ml/min ([name]) 02/2017   Followed by Dr. Florene Glen  . FSGS (focal segmental glomerulosclerosis) 2011   By renal biopsy  . Hx of lupus nephritis 2011   by renal biopsy  . Lupus (systemic lupus erythematosus) ([name])    followed by Dr. Amil Amen  . Obesity   . Renal stone   . Secondary hyperparathyroidism of renal origin [facility])     Past Surgical History:  Procedure Laterality Date  . AV FISTULA PLACEMENT Left 07/04/2017   Procedure: ARTERIOVENOUS (AV) FISTULA CREATION BRACHIOCEPHALIC;  Surgeon: [name], MD;  Location: [facility];  Service: Vascular;  Laterality: Left;  . IR FLUORO GUIDE CV LINE RIGHT  06/28/2017  . IR US GUIDE VASC ACCESS RIGHT  06/28/2017    Current Outpatient Medications on File Prior to Visit  Medication Sig  Dispense Refill  . acetaminophen (TYLENOL) 500 MG tablet Take 1,000 mg by mouth every 6 (six) hours as needed for headache.    Marland Kitchen aspirin 81 MG tablet Take 1 tablet (81 mg total) by mouth daily. 90 tablet 3  . HYDROcodone-acetaminophen (NORCO) 7.5-325 MG tablet Take 1 tablet by mouth every 6 (six) hours as needed for moderate pain or severe pain. 15 tablet 0  . hydroxychloroquine (PLAQUENIL) 200 MG tablet Take 1 tablet (200 mg total) by mouth 2 (two) times daily. 180 tablet 3  . multivitamin (RENA-VIT) TABS tablet Take 1 tablet by mouth at bedtime.  0   No current facility-administered medications on file prior to visit.     Allergies  Allergen Reactions  . Enalapril Maleate Other (See Comments)    Throat swelling  . Chocolate Nausea And Vomiting  . Penicillins Other (See Comments)    Has patient had a PCN reaction causing immediate rash, facial/tongue/throat swelling, SOB or lightheadedness with hypotension: No Has patient had a PCN reaction causing severe rash involving mucus membranes or skin necrosis: No Has patient had a PCN reaction that required hospitalization: No Has patient had a PCN reaction occurring within the last 10 years: No If all of the above answers are "NO", then may proceed with Cephalosporin use.     Family History  Problem Relation Age of Onset  . Diabetes Mother   . Hypertension Father   . Cancer Sister   . Hypertension Brother     Social History   Socioeconomic History  . Marital status: Legally Separated    Spouse name: Not on file  . Number of children: Not on file  . Years of education: Not on file  . Highest education level: Not on file  Occupational History  . Not on file  Social Needs  . Financial resource strain: Not on file  . Food insecurity:    Worry: Not on file    Inability: Not on file  . Transportation needs:    Medical: Not on file    Non-medical: Not on file  Tobacco Use  . Smoking status: Never Smoker  . Smokeless tobacco:  Never Used  Substance and Sexual Activity  . Alcohol use: No  . Drug use: No  . Sexual activity: Not on file  Lifestyle  . Physical activity:    Days per week: Not on file    Minutes per session: Not on file  . Stress: Not on file  Relationships  . Social connections:    Talks on phone: Not on file    Gets together: Not on file    Attends religious service: Not on file    Active member of club or organization: Not on file    Attends meetings of clubs or organizations: Not on file    Relationship status: Not on file  . Intimate partner violence:    Fear of current or ex partner: Not on file    Emotionally abused: Not on file    Physically abused: Not on file    Forced sexual activity: Not on file  Other Topics Concern  . Not on file  Social History Narrative  . Not on file    ROS General: Denies fever, chills, night sweats, changes in weight, changes in appetite HEENT: Denies headaches, ear pain, changes in vision, rhinorrhea, sore throat CV: Denies CP, palpitations, SOB, orthopnea Pulm: Denies SOB, cough, wheezing GI: Denies abdominal pain, nausea, vomiting, diarrhea, constipation GU: Denies dysuria, hematuria, frequency, vaginal discharge Msk: Denies muscle cramps, joint pains Neuro: Denies weakness, numbness, tingling Skin: Denies rashes, bruising Psych: Denies depression, anxiety, hallucinations  BP 118/72 (BP Location: Right Arm, Patient Position: Sitting, Cuff Size: Large)   Pulse 78   Temp 98.4 F (36.9 C) (Oral)   Ht _0  (1.676 m)   Wt 214 lb (97.1 kg)   SpO2 95%   BMI 34.54 kg/m   Physical Exam Gen. Pleasant, well developed, well-nourished, in NAD HEENT - [place]/AT, PERRL, no scleral icterus, no nasal drainage.   TMs normal bilaterally.  No cervical lymphadenopathy.  Lungs: no use of accessory muscles, CTAB, no wheezes, rales or rhonchi Cardiovascular: RRR, No r/g/m, no peripheral edema Abdomen: BS present, soft, nontender,nondistended Neuro:  A&Ox3, CN  II-XII intact, ambulating with a walker Skin:  Warm, dry, intact, no lesions.  Port in right upper chest.  AV fistula in left arm  Recent Results (from the past 2160 hour(s))  Basic metabolic panel     Status: Abnormal   Collection Time: 06/23/17  4:00 PM  Result Value Ref Range   Sodium 132 (L) 135 - 145 mmol/L   Potassium 5.8 (H) 3.5 - 5.1 mmol/L   Chloride 101 101 - 111 mmol/L   CO2 21 (L) 22 - 32 mmol/L   Glucose, Bld 97 65 - 99 mg/dL  BUN 88 (H) 6 - 20 mg/dL   Creatinine, Ser 5.70 (H) 0.44 - 1.00 mg/dL   Calcium 9.9 8.9 - 10.3 mg/dL   GFR calc non Af Amer 7 (L) >60 mL/min   GFR calc Af Amer 8 (L) >60 mL/min    Comment: (NOTE) The eGFR has been calculated using the CKD EPI equation. This calculation has not been validated in all clinical situations. eGFR's persistently <60 mL/min signify possible Chronic Kidney Disease.    Anion gap 10 5 - 15    Comment: Performed at [facility] [address] [place], [place] [address]  CBC     Status: Abnormal   Collection Time: 06/23/17  4:00 PM  Result Value Ref Range   WBC 4.5 4.0 - 10.5 K/uL   RBC 3.23 (L) 3.87 - 5.11 MIL/uL   Hemoglobin 9.2 (L) 12.0 - 15.0 g/dL   HCT 28.4 (L) 36.0 - 46.0 %   MCV 87.9 78.0 - 100.0 fL   MCH 28.5 26.0 - 34.0 pg   MCHC 32.4 30.0 - 36.0 g/dL   RDW 14.3 11.5 - 15.5 %   Platelets 198 150 - 400 K/uL    Comment: Performed at [facility], [place] [address], [place], [place] [address]  I-stat troponin, ED     Status: None   Collection Time: 06/23/17  4:09 PM  Result Value Ref Range   Troponin i, poc 0.00 0.00 - 0.08 ng/mL   Comment 3            Comment: Due to the release kinetics of cTnI, a negative result within the first hours of the onset of symptoms does not rule out myocardial infarction with certainty. If myocardial infarction is still suspected, repeat the test at appropriate intervals.   I-stat troponin, ED     Status: None   Collection Time: 06/23/17  7:06 PM  Result Value  Ref Range   Troponin i, poc 0.02 0.00 - 0.08 ng/mL   Comment 3            Comment: Due to the release kinetics of cTnI, a negative result within the first hours of the onset of symptoms does not rule out myocardial infarction with certainty. If myocardial infarction is still suspected, repeat the test at appropriate intervals.   Brain natriuretic peptide     Status: Abnormal   Collection Time: 06/27/17  3:52 PM  Result Value Ref Range   B Natriuretic Peptide 338.0 (H) 0.0 - 100.0 pg/mL    Comment: Performed at [facility] [address], [place], [place] [address]  Basic metabolic panel     Status: Abnormal   Collection Time: 06/27/17  3:53 PM  Result Value Ref Range   Sodium 135 135 - 145 mmol/L   Potassium 5.4 (H) 3.5 - 5.1 mmol/L   Chloride 103 101 - 111 mmol/L   CO2 22 22 - 32 mmol/L   Glucose, Bld 111 (H) 65 - 99 mg/dL   BUN 81 (H) 6 - 20 mg/dL   Creatinine, Ser 5.49 (H) 0.44 - 1.00 mg/dL   Calcium 10.8 (H) 8.9 - 10.3 mg/dL   GFR calc non Af Amer 8 (L) >60 mL/min   GFR calc Af Amer 9 (L) >60 mL/min    Comment: (NOTE) The eGFR has been calculated using the CKD EPI equation. This calculation has not been validated in all clinical situations. eGFR's persistently <60 mL/min signify possible Chronic Kidney Disease.  Anion gap 10 5 - 15    Comment: Performed at [facility] [address], [place], [place] [address]  CBC     Status: Abnormal   Collection Time: 06/27/17  3:53 PM  Result Value Ref Range   WBC 4.3 4.0 - 10.5 K/uL   RBC 3.32 (L) 3.87 - 5.11 MIL/uL   Hemoglobin 9.3 (L) 12.0 - 15.0 g/dL   HCT 29.3 (L) 36.0 - 46.0 %   MCV 88.3 78.0 - 100.0 fL   MCH 28.0 26.0 - 34.0 pg   MCHC 31.7 30.0 - 36.0 g/dL   RDW 14.2 11.5 - 15.5 %   Platelets 195 150 - 400 K/uL    Comment: Performed at [facility], [place] [address], [place], [place] [address]  I-stat troponin, ED     Status: None   Collection Time: 06/27/17  4:09 PM  Result Value Ref Range    Troponin i, poc 0.02 0.00 - 0.08 ng/mL   Comment 3            Comment: Due to the release kinetics of cTnI, a negative result within the first hours of the onset of symptoms does not rule out myocardial infarction with certainty. If myocardial infarction is still suspected, repeat the test at appropriate intervals.   Urinalysis, Routine w reflex microscopic     Status: Abnormal   Collection Time: 06/27/17  6:33 PM  Result Value Ref Range   Color, Urine YELLOW YELLOW   APPearance CLEAR CLEAR   Specific Gravity, Urine 1.012 1.005 - 1.030   pH 5.0 5.0 - 8.0   Glucose, UA NEGATIVE NEGATIVE mg/dL   Hgb urine dipstick NEGATIVE NEGATIVE   Bilirubin Urine NEGATIVE NEGATIVE   Ketones, ur NEGATIVE NEGATIVE mg/dL   Protein, ur 30 (A) NEGATIVE mg/dL   Nitrite NEGATIVE NEGATIVE   Leukocytes, UA TRACE (A) NEGATIVE   RBC / HPF 0-5 0 - 5 RBC/hpf   WBC, UA 0-5 0 - 5 WBC/hpf   Bacteria, UA NONE SEEN NONE SEEN   Squamous Epithelial / LPF 6-10 0 - 5   Mucus PRESENT    Hyaline Casts, UA PRESENT     Comment: Performed at [facility], [address], [place], [place] [address]  Hepatitis B surface antigen     Status: None   Collection Time: 06/27/17 10:51 PM  Result Value Ref Range   Hepatitis B Surface Ag Negative Negative    Comment: (NOTE) Performed At: [facility] [address] [place], [place] [id number] Rush Farmer MD [id number] Performed at [facility], [place] [address] [place], [place] [address]   CBC     Status: Abnormal   Collection Time: 06/28/17  6:29 AM  Result Value Ref Range   WBC 4.7 4.0 - 10.5 K/uL   RBC 3.10 (L) 3.87 - 5.11 MIL/uL   Hemoglobin 8.6 (L) 12.0 - 15.0 g/dL   HCT 27.0 (L) 36.0 - 46.0 %   MCV 87.1 78.0 - 100.0 fL   MCH 27.7 26.0 - 34.0 pg   MCHC 31.9 30.0 - 36.0 g/dL   RDW 14.2 11.5 - 15.5 %   Platelets 172 150 - 400 K/uL    Comment: Performed at [facility], [place] [address], [place], [place] [address]  Renal function panel      Status: Abnormal   Collection Time: 06/28/17  6:29 AM  Result Value Ref Range   Sodium 139 135 - 145  mmol/L   Potassium 4.8 3.5 - 5.1 mmol/L   Chloride 106 101 - 111 mmol/L   CO2 21 (L) 22 - 32 mmol/L   Glucose, Bld 88 65 - 99 mg/dL   BUN 83 (H) 6 - 20 mg/dL   Creatinine, Ser 5.65 (H) 0.44 - 1.00 mg/dL   Calcium 10.0 8.9 - 10.3 mg/dL   Phosphorus 6.4 (H) 2.5 - 4.6 mg/dL   Albumin 2.9 (L) 3.5 - 5.0 g/dL   GFR calc non Af Amer 7 (L) >60 mL/min   GFR calc Af Amer 9 (L) >60 mL/min    Comment: (NOTE) The eGFR has been calculated using the CKD EPI equation. This calculation has not been validated in all clinical situations. eGFR's persistently <60 mL/min signify possible Chronic Kidney Disease.    Anion gap 12 5 - 15    Comment: Performed at [facility] [address], [place], [place] [address]  Parathyroid hormone, intact (no Ca)     Status: None   Collection Time: 06/28/17  6:29 AM  Result Value Ref Range   PTH 45 15 - 65 pg/mL    Comment: (NOTE) Performed At: [facility] [place], [place] [id number] Rush Farmer MD [id number] Performed at [facility], [place] [address] [place], [place] [address]   Iron and TIBC     Status: Abnormal   Collection Time: 06/28/17  6:29 AM  Result Value Ref Range   Iron 48 28 - 170 ug/dL   TIBC 175 (L) 250 - 450 ug/dL   Saturation Ratios 27 10.4 - 31.8 %   UIBC 127 ug/dL    Comment: Performed at [facility], [place] [address], [place], [place] [address]  HIV antibody     Status: None   Collection Time: 06/28/17  6:29 AM  Result Value Ref Range   HIV Screen 4th Generation wRfx Non Reactive Non Reactive    Comment: (NOTE) Performed At: [facility] [place], [place] [id number] Rush Farmer MD [id number] Performed at [facility], [place] [address], [place], [place] [address]   Protime-INR     Status: None   Collection Time: 06/28/17  6:29 AM  Result Value Ref  Range   Prothrombin Time 14.1 11.4 - 15.2 seconds   INR 1.09     Comment: Performed at [facility] [address], [place], [place] [address]  CBC     Status: Abnormal   Collection Time: 06/29/17  9:25 AM  Result Value Ref Range   WBC 4.3 4.0 - 10.5 K/uL   RBC 2.97 (L) 3.87 - 5.11 MIL/uL   Hemoglobin 8.2 (L) 12.0 - 15.0 g/dL   HCT 26.5 (L) 36.0 - 46.0 %   MCV 89.2 78.0 - 100.0 fL   MCH 27.6 26.0 - 34.0 pg   MCHC 30.9 30.0 - 36.0 g/dL   RDW 14.1 11.5 - 15.5 %   Platelets 172 150 - 400 K/uL    Comment: Performed at [facility], [place] [address] [place],  [address]  Hepatitis B core antibody, total     Status: None   Collection Time: 06/29/17  9:25 AM  Result Value Ref Range   Hep B Core Total Ab Negative Negative    Comment: (NOTE) Performed At: [facility] [address] [place], [place] [id number] Rush Farmer MD [id number] Performed at [facility], [place] [address] [place],  [address]  Renal function panel     Status: Abnormal   Collection Time: 06/29/17  9:26 AM  Result Value Ref Range   Sodium 138 135 - 145 mmol/L   Potassium 4.5 3.5 - 5.1 mmol/L   Chloride 107 101 - 111 mmol/L   CO2 22 22 - 32 mmol/L   Glucose, Bld 89 65 - 99 mg/dL   BUN 71 (H) 6 - 20 mg/dL   Creatinine, Ser 4.94 (H) 0.44 - 1.00 mg/dL   Calcium 9.4 8.9 - 10.3 mg/dL   Phosphorus 4.7 (H) 2.5 - 4.6 mg/dL   Albumin 2.9 (L) 3.5 - 5.0 g/dL   GFR calc non Af Amer 9 (L) >60 mL/min   GFR calc Af Amer 10 (L) >60 mL/min    Comment: (NOTE) The eGFR has been calculated using the CKD EPI equation. This calculation has not been validated in all clinical situations. eGFR's persistently <60 mL/min signify possible Chronic Kidney Disease.    Anion gap 9 5 - 15    Comment: Performed at [facility] [address], [place], [place] [address]  CBC     Status: Abnormal   Collection Time: 06/30/17  8:19 PM  Result Value Ref Range   WBC 4.9 4.0 - 10.5 K/uL    RBC 2.95 (L) 3.87 - 5.11 MIL/uL   Hemoglobin 8.3 (L) 12.0 - 15.0 g/dL   HCT 26.1 (L) 36.0 - 46.0 %   MCV 88.5 78.0 - 100.0 fL   MCH 28.1 26.0 - 34.0 pg   MCHC 31.8 30.0 - 36.0 g/dL   RDW 13.9 11.5 - 15.5 %   Platelets 159 150 - 400 K/uL    Comment: Performed at [facility] [address] [place], [place] [address]  Renal function panel     Status: Abnormal   Collection Time: 06/30/17  8:19 PM  Result Value Ref Range   Sodium 137 135 - 145 mmol/L   Potassium 3.7 3.5 - 5.1 mmol/L   Chloride 103 101 - 111 mmol/L   CO2 24 22 - 32 mmol/L   Glucose, Bld 112 (H) 65 - 99 mg/dL   BUN 45 (H) 6 - 20 mg/dL   Creatinine, Ser 4.51 (H) 0.44 - 1.00 mg/dL   Calcium 8.5 (L) 8.9 - 10.3 mg/dL   Phosphorus 3.4 2.5 - 4.6 mg/dL   Albumin 2.9 (L) 3.5 - 5.0 g/dL   GFR calc non Af Amer 10 (L) >60 mL/min   GFR calc Af Amer 11 (L) >60 mL/min    Comment: (NOTE) The eGFR has been calculated using the CKD EPI equation. This calculation has not been validated in all clinical situations. eGFR's persistently <60 mL/min signify possible Chronic Kidney Disease.    Anion gap 10 5 - 15    Comment: Performed at [facility] [address] [place], [place] [address]  Hepatitis B surface antibody     Status: None   Collection Time: 06/30/17  9:50 PM  Result Value Ref Range   Hep B S Ab Reactive     Comment: (NOTE)              Non Reactive: Inconsistent with immunity,                            less than 10 mIU/mL              Reactive:     Consistent with  immunity,                            greater than 9.9 mIU/mL Performed At: [facility] [place], [place] [id number] Rush Farmer MD [id number] Performed at [facility], [place] [address] [place], [place] [address]   Glucose, capillary     Status: None   Collection Time: 07/04/17  5:58 AM  Result Value Ref Range   Glucose-Capillary 89 65 - 99 mg/dL  Protime-INR     Status: None   Collection Time: 07/04/17   7:51 AM  Result Value Ref Range   Prothrombin Time 13.8 11.4 - 15.2 seconds   INR 1.07     Comment: Performed at [facility], [place] [address], [place], [place] [address]  Basic metabolic panel     Status: Abnormal   Collection Time: 07/04/17  7:51 AM  Result Value Ref Range   Sodium 134 (L) 135 - 145 mmol/L   Potassium 3.5 3.5 - 5.1 mmol/L   Chloride 94 (L) 101 - 111 mmol/L   CO2 26 22 - 32 mmol/L   Glucose, Bld 102 (H) 65 - 99 mg/dL   BUN 36 (H) 6 - 20 mg/dL   Creatinine, Ser 5.07 (H) 0.44 - 1.00 mg/dL   Calcium 8.0 (L) 8.9 - 10.3 mg/dL   GFR calc non Af Amer 8 (L) >60 mL/min   GFR calc Af Amer 10 (L) >60 mL/min    Comment: (NOTE) The eGFR has been calculated using the CKD EPI equation. This calculation has not been validated in all clinical situations. eGFR's persistently <60 mL/min signify possible Chronic Kidney Disease.    Anion gap 14 5 - 15    Comment: Performed at [facility] [address] [place], [place] [address]  CBC     Status: Abnormal   Collection Time: 07/04/17  7:51 AM  Result Value Ref Range   WBC 5.0 4.0 - 10.5 K/uL   RBC 2.88 (L) 3.87 - 5.11 MIL/uL   Hemoglobin 8.0 (L) 12.0 - 15.0 g/dL   HCT 25.9 (L) 36.0 - 46.0 %   MCV 89.9 78.0 - 100.0 fL   MCH 27.8 26.0 - 34.0 pg   MCHC 30.9 30.0 - 36.0 g/dL   RDW 14.1 11.5 - 15.5 %   Platelets 134 (L) 150 - 400 K/uL    Comment: Performed at [facility], [place] [address], [place], [place] [address]  Surgical pcr screen     Status: None   Collection Time: 07/04/17  8:17 AM  Result Value Ref Range   MRSA, PCR NEGATIVE NEGATIVE   Staphylococcus aureus NEGATIVE NEGATIVE    Comment: (NOTE) The Xpert SA Assay (FDA approved for NASAL specimens in patients 57 years of age and older), is one component of a comprehensive surveillance program. It is not intended to diagnose infection nor to guide or monitor treatment. Performed at [facility], [place] [address] [place], [place] [address]     Renal function panel     Status: Abnormal   Collection Time: 07/05/17 12:38 PM  Result Value Ref Range   Sodium 133 (L) 135 - 145 mmol/L   Potassium 3.8 3.5 - 5.1 mmol/L   Chloride 96 (L) 101 - 111 mmol/L   CO2 24 22 - 32 mmol/L   Glucose, Bld 96 65 - 99 mg/dL   BUN 46 (H) 6 -  20 mg/dL   Creatinine, Ser 5.70 (H) 0.44 - 1.00 mg/dL   Calcium 8.2 (L) 8.9 - 10.3 mg/dL   Phosphorus 4.7 (H) 2.5 - 4.6 mg/dL   Albumin 2.9 (L) 3.5 - 5.0 g/dL   GFR calc non Af Amer 7 (L) >60 mL/min   GFR calc Af Amer 8 (L) >60 mL/min    Comment: (NOTE) The eGFR has been calculated using the CKD EPI equation. This calculation has not been validated in all clinical situations. eGFR's persistently <60 mL/min signify possible Chronic Kidney Disease.    Anion gap 13 5 - 15    Comment: Performed at [facility] [address], [place], [place] [address]  CBC     Status: Abnormal   Collection Time: 07/05/17 12:38 PM  Result Value Ref Range   WBC 5.6 4.0 - 10.5 K/uL   RBC 2.78 (L) 3.87 - 5.11 MIL/uL   Hemoglobin 7.9 (L) 12.0 - 15.0 g/dL   HCT 25.1 (L) 36.0 - 46.0 %   MCV 90.3 78.0 - 100.0 fL   MCH 28.4 26.0 - 34.0 pg   MCHC 31.5 30.0 - 36.0 g/dL   RDW 14.2 11.5 - 15.5 %   Platelets 141 (L) 150 - 400 K/uL    Comment: Performed at [facility] [address], [place], [place] [address]    Assessment/Plan: End stage renal disease ([facility]) -Continue HD on T, Th,Sa -Continue following with nephrology -Continue Rena-Vit -Avoid nephrotoxic medications  Lupus ([place]) -Continue following with Dr. Amil Amen  Encounter to establish care -We reviewed the PMH, PSH, FH, SH, Meds and Allergies. -We provided refills for any medications we will prescribe as needed. -We addressed current concerns per orders and patient instructions. -We have asked for records for pertinent exams, studies, vaccines and notes from previous providers. -We have advised patient to follow up per instructions below.   Follow-up  PRN.    Grier Mitts, MD

## 2017-07-27 NOTE — Telephone Encounter (Signed)
Olen Pel with [facility], left a detailed message regarding pt, stated that pt was seen by Dr Cain Saupe today at our office and that she approved the PT Verbal orders for patient to start therapy.

## 2017-07-28 DIAGNOSIS — D509 Iron deficiency anemia, unspecified: Secondary | ICD-10-CM | POA: Diagnosis not present

## 2017-07-28 DIAGNOSIS — E876 Hypokalemia: Secondary | ICD-10-CM | POA: Diagnosis not present

## 2017-07-28 DIAGNOSIS — N186 End stage renal disease: Secondary | ICD-10-CM | POA: Diagnosis not present

## 2017-07-28 DIAGNOSIS — D631 Anemia in chronic kidney disease: Secondary | ICD-10-CM | POA: Diagnosis not present

## 2017-07-29 DIAGNOSIS — N186 End stage renal disease: Secondary | ICD-10-CM | POA: Diagnosis not present

## 2017-07-29 DIAGNOSIS — M3215 Tubulo-interstitial nephropathy in systemic lupus erythematosus: Secondary | ICD-10-CM | POA: Diagnosis not present

## 2017-07-29 DIAGNOSIS — Z9181 History of falling: Secondary | ICD-10-CM | POA: Diagnosis not present

## 2017-07-29 DIAGNOSIS — I120 Hypertensive chronic kidney disease with stage 5 chronic kidney disease or end stage renal disease: Secondary | ICD-10-CM | POA: Diagnosis not present

## 2017-07-29 DIAGNOSIS — Z992 Dependence on renal dialysis: Secondary | ICD-10-CM | POA: Diagnosis not present

## 2017-07-29 DIAGNOSIS — D631 Anemia in chronic kidney disease: Secondary | ICD-10-CM | POA: Diagnosis not present

## 2017-07-30 DIAGNOSIS — E876 Hypokalemia: Secondary | ICD-10-CM | POA: Diagnosis not present

## 2017-07-30 DIAGNOSIS — N186 End stage renal disease: Secondary | ICD-10-CM | POA: Diagnosis not present

## 2017-07-30 DIAGNOSIS — D509 Iron deficiency anemia, unspecified: Secondary | ICD-10-CM | POA: Diagnosis not present

## 2017-07-30 DIAGNOSIS — D631 Anemia in chronic kidney disease: Secondary | ICD-10-CM | POA: Diagnosis not present

## 2017-08-01 DIAGNOSIS — D631 Anemia in chronic kidney disease: Secondary | ICD-10-CM | POA: Diagnosis not present

## 2017-08-01 DIAGNOSIS — I120 Hypertensive chronic kidney disease with stage 5 chronic kidney disease or end stage renal disease: Secondary | ICD-10-CM | POA: Diagnosis not present

## 2017-08-01 DIAGNOSIS — Z992 Dependence on renal dialysis: Secondary | ICD-10-CM | POA: Diagnosis not present

## 2017-08-01 DIAGNOSIS — Z9181 History of falling: Secondary | ICD-10-CM | POA: Diagnosis not present

## 2017-08-01 DIAGNOSIS — N186 End stage renal disease: Secondary | ICD-10-CM | POA: Diagnosis not present

## 2017-08-01 DIAGNOSIS — M3215 Tubulo-interstitial nephropathy in systemic lupus erythematosus: Secondary | ICD-10-CM | POA: Diagnosis not present

## 2017-08-02 ENCOUNTER — Other Ambulatory Visit: Payer: Self-pay

## 2017-08-02 ENCOUNTER — Ambulatory Visit: Payer: Self-pay | Admitting: Family Medicine

## 2017-08-02 ENCOUNTER — Encounter (HOSPITAL_COMMUNITY): Payer: Self-pay | Admitting: Emergency Medicine

## 2017-08-02 ENCOUNTER — Emergency Department (HOSPITAL_COMMUNITY): Payer: Medicare Other

## 2017-08-02 ENCOUNTER — Emergency Department (HOSPITAL_COMMUNITY)
Admission: EM | Admit: 2017-08-02 | Discharge: 2017-08-03 | Disposition: A | Discharge status: Home / Self Care | Payer: Medicare Other | Attending: Emergency Medicine | Admitting: Emergency Medicine

## 2017-08-02 DIAGNOSIS — I120 Hypertensive chronic kidney disease with stage 5 chronic kidney disease or end stage renal disease: Secondary | ICD-10-CM | POA: Diagnosis not present

## 2017-08-02 DIAGNOSIS — S199XXA Unspecified injury of neck, initial encounter: Secondary | ICD-10-CM | POA: Diagnosis not present

## 2017-08-02 DIAGNOSIS — N186 End stage renal disease: Secondary | ICD-10-CM | POA: Diagnosis not present

## 2017-08-02 DIAGNOSIS — Z992 Dependence on renal dialysis: Secondary | ICD-10-CM | POA: Diagnosis not present

## 2017-08-02 DIAGNOSIS — Z79899 Other long term (current) drug therapy: Secondary | ICD-10-CM | POA: Insufficient documentation

## 2017-08-02 DIAGNOSIS — Y999 Unspecified external cause status: Secondary | ICD-10-CM | POA: Insufficient documentation

## 2017-08-02 DIAGNOSIS — D509 Iron deficiency anemia, unspecified: Secondary | ICD-10-CM | POA: Diagnosis not present

## 2017-08-02 DIAGNOSIS — E876 Hypokalemia: Secondary | ICD-10-CM | POA: Diagnosis not present

## 2017-08-02 DIAGNOSIS — R51 Headache: Secondary | ICD-10-CM | POA: Diagnosis not present

## 2017-08-02 DIAGNOSIS — Y939 Activity, unspecified: Secondary | ICD-10-CM | POA: Insufficient documentation

## 2017-08-02 DIAGNOSIS — Y929 Unspecified place or not applicable: Secondary | ICD-10-CM | POA: Diagnosis not present

## 2017-08-02 DIAGNOSIS — S0990XA Unspecified injury of head, initial encounter: Secondary | ICD-10-CM | POA: Diagnosis not present

## 2017-08-02 DIAGNOSIS — D631 Anemia in chronic kidney disease: Secondary | ICD-10-CM | POA: Diagnosis not present

## 2017-08-02 DIAGNOSIS — W19XXXA Unspecified fall, initial encounter: Secondary | ICD-10-CM | POA: Insufficient documentation

## 2017-08-02 LAB — CBC WITH DIFFERENTIAL/PLATELET
ABS IMMATURE GRANULOCYTES: 0 10*3/uL (ref 0.0–0.1)
BASOS PCT: 0 %
Basophils Absolute: 0 10*3/uL (ref 0.0–0.1)
Eosinophils Absolute: 0.1 10*3/uL (ref 0.0–0.7)
Eosinophils Relative: 2 %
HEMATOCRIT: 31.9 % — AB (ref 36.0–46.0)
Hemoglobin: 9.6 g/dL — ABNORMAL LOW (ref 12.0–15.0)
Immature Granulocytes: 1 %
LYMPHS ABS: 0.9 10*3/uL (ref 0.7–4.0)
Lymphocytes Relative: 23 %
MCH: 28.6 pg (ref 26.0–34.0)
MCHC: 30.1 g/dL (ref 30.0–36.0)
MCV: 94.9 fL (ref 78.0–100.0)
MONO ABS: 0.4 10*3/uL (ref 0.1–1.0)
MONOS PCT: 9 %
NEUTROS ABS: 2.7 10*3/uL (ref 1.7–7.7)
Neutrophils Relative %: 65 %
PLATELETS: 160 10*3/uL (ref 150–400)
RBC: 3.36 MIL/uL — ABNORMAL LOW (ref 3.87–5.11)
RDW: 16.1 % — ABNORMAL HIGH (ref 11.5–15.5)
WBC: 4 10*3/uL (ref 4.0–10.5)

## 2017-08-02 LAB — BASIC METABOLIC PANEL
ANION GAP: 10 (ref 5–15)
ANION GAP: 12 (ref 5–15)
BUN: 5 mg/dL — ABNORMAL LOW (ref 6–20)
BUN: 6 mg/dL (ref 6–20)
CALCIUM: 7.9 mg/dL — AB (ref 8.9–10.3)
CHLORIDE: 96 mmol/L — AB (ref 101–111)
CHLORIDE: 93 mmol/L — AB (ref 101–111)
CO2: 32 mmol/L (ref 22–32)
CO2: 31 mmol/L (ref 22–32)
Calcium: 7.9 mg/dL — ABNORMAL LOW (ref 8.9–10.3)
Creatinine, Ser: 1.89 mg/dL — ABNORMAL HIGH (ref 0.44–1.00)
Creatinine, Ser: 2.32 mg/dL — ABNORMAL HIGH (ref 0.44–1.00)
GFR calc Af Amer: 32 mL/min — ABNORMAL LOW (ref >60)
GFR calc Af Amer: 25 mL/min — ABNORMAL LOW (ref >60)
GFR calc non Af Amer: 28 mL/min — ABNORMAL LOW (ref >60)
GFR, EST NON AFRICAN AMERICAN: 22 mL/min — AB (ref >60)
GLUCOSE: 102 mg/dL — AB (ref 65–99)
GLUCOSE: 109 mg/dL — AB (ref 65–99)
POTASSIUM: 2.4 mmol/L — AB (ref 3.5–5.1)
Potassium: 2.5 mmol/L — CL (ref 3.5–5.1)
Sodium: 138 mmol/L (ref 135–145)
Sodium: 136 mmol/L (ref 135–145)

## 2017-08-02 LAB — MAGNESIUM: Magnesium: 1.8 mg/dL (ref 1.7–2.4)

## 2017-08-02 MED ORDER — POTASSIUM CHLORIDE 10 MEQ/100ML IV SOLN: 10.0000 meq | Freq: Once | INTRAVENOUS | Status: DC

## 2017-08-02 MED ORDER — POTASSIUM CHLORIDE CRYS ER 20 MEQ PO TBCR
40.0000 meq | EXTENDED_RELEASE_TABLET | Freq: Once | ORAL | Status: AC
  Administered 2017-08-02: 40 meq via ORAL
  Filled 2017-08-02: qty 2

## 2017-08-02 MED ORDER — POTASSIUM CHLORIDE 10 MEQ/100ML IV SOLN
10.0000 meq | INTRAVENOUS | Status: AC
  Administered 2017-08-02 (×2): 10 meq via INTRAVENOUS
  Filled 2017-08-02 (×3): qty 100

## 2017-08-02 NOTE — ED Provider Notes (Signed)
[facility] EMERGENCY DEPARTMENT Provider Note   CSN: [id number] Arrival date & time: 08/02/17  1733     History   Chief Complaint Chief Complaint  Jenna [name] presents with  . Fall    HPI Jenna [name] is a 62 y.o. female hx of CKD, lupus nephritis here presenting with fall.  Jenna [name] states that Jenna [name] got up around 6 AM and tripped and fell and hit the right side of her head as well as the right shoulder and forearm and hip.  Jenna [name] was still able to ambulate and walk to the dialysis center.  Finish her full dialysis and has some headaches so came here for evaluation.  Jenna [name] states that Jenna [name] is on hydrocodone at home and does not want any pain medicine currently.  Jenna [name] denies passing out or chest pain.  The history is provided by the Jenna [name].    Past Medical History:  Diagnosis Date  . CKD (chronic kidney disease) stage 5, GFR less than 15 ml/min ([name]) 02/2017   Followed by Dr. Florene Glen  . FSGS (focal segmental glomerulosclerosis) 2011   By renal biopsy  . Hx of lupus nephritis 2011   by renal biopsy  . Lupus (systemic lupus erythematosus) ([name])    followed by Dr. Amil Amen  . Obesity   . Renal stone   . Secondary hyperparathyroidism of renal origin [facility])     Jenna [name] Active Problem List   Diagnosis Date Noted  . End stage renal disease ([place]) 06/27/2017  . CKD (chronic kidney disease) stage 5, GFR less than 15 ml/min ([name]) 02/15/2017  . Back pain at L4-L5 level 10/31/2014  . Muscle spasm of back 10/31/2014  . Breast cancer screening 09/30/2014  . Visit for screening mammogram 09/30/2014  . Chronic maxillary sinusitis 07/04/2014  . Occipital headache 07/04/2014  . Increased urinary frequency 05/23/2014  . Falls 05/23/2014  . Prediabetes 05/23/2014  . Rash and nonspecific skin eruption 05/23/2014  . Essential hypertension 04/16/2014  . Encounter for immunization 11/19/2013  . Lupus (systemic lupus erythematosus) ([place]) 09/24/2013  . CKD (chronic kidney  disease) stage 4, GFR 15-29 ml/min ([name]) 07/02/2013  . TIA (transient ischemic attack) 05/05/2013  . Numbness and tingling of left side of face 05/04/2013  . Lupus ([place]) 04/15/2011  . 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  . 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    Past Surgical History:  Procedure Laterality Date  . AV FISTULA PLACEMENT Left 07/04/2017   Procedure: ARTERIOVENOUS (AV) FISTULA CREATION BRACHIOCEPHALIC;  Surgeon: [name], MD;  Location: [place];  Service: Vascular;  Laterality: Left;  . IR FLUORO GUIDE CV LINE RIGHT  06/28/2017  . IR US GUIDE VASC ACCESS RIGHT  06/28/2017     OB History   None      Home Medications    Prior to Admission medications   Medication Sig Start Date End Date Taking? Authorizing Provider  acetaminophen (TYLENOL) 500 MG tablet Take 1,000 mg by mouth every 6 (six) hours as needed for headache.    [provider]  aspirin 81 MG tablet Take 1 tablet (81 mg total) by mouth daily. 12/25/13   Tresa Garter, MD  HYDROcodone-acetaminophen (NORCO) 7.5-325 MG tablet Take 1 tablet by mouth every 6 (six) hours as needed for moderate pain or severe pain. 07/05/17   Bonnielee Haff, MD  hydroxychloroquine (PLAQUENIL) 200 MG tablet Take 1 tablet (200 mg total) by mouth 2 (two) times daily. 12/25/13   Tresa Garter, MD  multivitamin (  RENA-VIT) TABS tablet Take 1 tablet by mouth at bedtime. 07/05/17   Bonnielee Haff, MD    Family History Family History  Problem Relation Age of Onset  . Diabetes Mother   . Hypertension Father   . Cancer Sister   . Hypertension Brother     Social History Social History   Tobacco Use  . Smoking status: Never Smoker  . Smokeless tobacco: Never Used  Substance Use Topics  . Alcohol use: No  . Drug use: No     Allergies   Enalapril maleate; Chocolate; and Penicillins   Review of Systems Review of Systems  Neurological: Positive for headaches.  All other systems reviewed and  are negative.    Physical Exam Updated Vital Signs BP 133/71   Pulse 87   Temp 98.2 F (36.8 C) (Oral)   Resp 20   Ht $R'5\' 7"'rf$  (1.702 m)   Wt 96.2 kg (212 lb)   SpO2 100%   BMI 33.20 kg/m   Physical Exam  Constitutional: Jenna [name] is oriented to person, place, and time.  Chronically ill   HENT:  Head: Normocephalic.  Mouth/Throat: Oropharynx is clear and moist.  Mild R temporal tenderness, no obvious scalp hematoma   Eyes: Pupils are equal, round, and reactive to light. Conjunctivae and EOM are normal.  Neck: Normal range of motion. Neck supple.  No midline tenderness   Cardiovascular: Normal rate, regular rhythm and normal heart sounds.  Pulmonary/Chest: Effort normal and breath sounds normal. No stridor. No respiratory distress. Jenna [name] has no wheezes.  Abdominal: Soft. Bowel sounds are normal. Jenna [name] exhibits no distension. There is no tenderness. There is no guarding.  Musculoskeletal:  Mild R shoulder tenderness but nl ROM. Mild R forearm tenderness bu no obvious deformity. No midline spinal tenderness. Nl ROM bilateral hips.   Neurological: Jenna [name] is alert and oriented to person, place, and time.  Skin: Skin is warm.  Psychiatric: Jenna [name] has a normal mood and affect.  Nursing note and vitals reviewed.    ED Treatments / Results  Labs (all labs ordered are listed, but only abnormal results are displayed) Labs Reviewed  BASIC METABOLIC PANEL - Abnormal; Notable for the following components:      Result Value   Potassium 2.5 (*)    Chloride 96 (*)    Glucose, Bld 102 (*)    BUN 5 (*)    Creatinine, Ser 1.89 (*)    Calcium 7.9 (*)    GFR calc non Af Amer 28 (*)    GFR calc Af Amer 32 (*)    All other components within normal limits  CBC WITH DIFFERENTIAL/PLATELET - Abnormal; Notable for the following components:   RBC 3.36 (*)    Hemoglobin 9.6 (*)    HCT 31.9 (*)    RDW 16.1 (*)    All other components within normal limits  BASIC METABOLIC PANEL  MAGNESIUM     EKG None  Radiology Ct Head Wo Contrast  Result Date: 08/02/2017 CLINICAL DATA:  Jenna [name] tripped over for intra falling onto hardwood floor. Headache with blurry vision. Jenna [name] was given heparin for dialysis prior to fall. EXAM: CT HEAD WITHOUT CONTRAST CT CERVICAL SPINE WITHOUT CONTRAST TECHNIQUE: Multidetector CT imaging of the head and cervical spine was performed following the standard protocol without intravenous contrast. Multiplanar CT image reconstructions of the cervical spine were also generated. COMPARISON:  None. FINDINGS: CT HEAD FINDINGS BRAIN: The ventricles and sulci are normal. No intraparenchymal hemorrhage, mass effect nor midline shift. No  acute large vascular territory infarcts. Grey-white matter distinction is maintained. The basal ganglia are unremarkable. No abnormal extra-axial fluid collections. Basal cisterns are not effaced and midline. The brainstem and cerebellar hemispheres are without acute abnormalities. VASCULAR: Moderate carotid siphon atherosclerosis. No hyperdense vessel sign. SKULL/SOFT TISSUES: No skull fracture. No significant soft tissue swelling. ORBITS/SINUSES: The included ocular globes and orbital contents are normal.The mastoid air cells are clear. The included paranasal sinuses are well-aerated. OTHER: None. CT CERVICAL SPINE FINDINGS Alignment: Straightening of cervical lordosis which may be due to muscle spasm or Jenna [name] positioning. Skull base and vertebrae: Intact skull base. Intact atlantodental interval. No acute vertebral fracture. Soft tissues and spinal canal: Prevertebral soft tissue swelling. Disc levels: C2-C3: Small central disc herniation. No significant central or foraminal stenosis. C3-C4: Small central disc herniation. No significant central foraminal stenosis. C4-C5:  Negative C5-C6:  Negative C6-C7:  Negative C7-T1:  Negative Upper chest: None Other: Right jugular approach dialysis catheter is partially included. IMPRESSION: 1. No acute  intracranial abnormality. 2. Small central disc herniation C2-3 and C3-4. No acute cervical spine fracture or listhesis. Electronically Signed   By: Tollie Eth M.D.   On: 08/02/2017 19:18   Ct Cervical Spine Wo Contrast  Result Date: 08/02/2017 CLINICAL DATA:  Jenna [name] tripped over for intra falling onto hardwood floor. Headache with blurry vision. Jenna [name] was given heparin for dialysis prior to fall. EXAM: CT HEAD WITHOUT CONTRAST CT CERVICAL SPINE WITHOUT CONTRAST TECHNIQUE: Multidetector CT imaging of the head and cervical spine was performed following the standard protocol without intravenous contrast. Multiplanar CT image reconstructions of the cervical spine were also generated. COMPARISON:  None. FINDINGS: CT HEAD FINDINGS BRAIN: The ventricles and sulci are normal. No intraparenchymal hemorrhage, mass effect nor midline shift. No acute large vascular territory infarcts. Grey-white matter distinction is maintained. The basal ganglia are unremarkable. No abnormal extra-axial fluid collections. Basal cisterns are not effaced and midline. The brainstem and cerebellar hemispheres are without acute abnormalities. VASCULAR: Moderate carotid siphon atherosclerosis. No hyperdense vessel sign. SKULL/SOFT TISSUES: No skull fracture. No significant soft tissue swelling. ORBITS/SINUSES: The included ocular globes and orbital contents are normal.The mastoid air cells are clear. The included paranasal sinuses are well-aerated. OTHER: None. CT CERVICAL SPINE FINDINGS Alignment: Straightening of cervical lordosis which may be due to muscle spasm or Jenna [name] positioning. Skull base and vertebrae: Intact skull base. Intact atlantodental interval. No acute vertebral fracture. Soft tissues and spinal canal: Prevertebral soft tissue swelling. Disc levels: C2-C3: Small central disc herniation. No significant central or foraminal stenosis. C3-C4: Small central disc herniation. No significant central foraminal stenosis. C4-C5:   Negative C5-C6:  Negative C6-C7:  Negative C7-T1:  Negative Upper chest: None Other: Right jugular approach dialysis catheter is partially included. IMPRESSION: 1. No acute intracranial abnormality. 2. Small central disc herniation C2-3 and C3-4. No acute cervical spine fracture or listhesis. Electronically Signed   By: Tollie Eth M.D.   On: 08/02/2017 19:18    Procedures Procedures (including critical care time)  Medications Ordered in ED Medications  potassium chloride SA (K-DUR,KLOR-CON) CR tablet 40 mEq (0 mEq Oral Hold 08/02/17 2038)  potassium chloride 10 mEq in 100 mL IVPB (0 mEq Intravenous Hold 08/02/17 2039)     Initial Impression / Assessment and Plan / ED Course  I have reviewed the triage vital signs and the nursing notes.  Pertinent labs & imaging results that were available during my care of the Jenna [name] were reviewed by me and considered in my medical decision  making (see chart for details).    SPENCER PETERKIN is a 62 y.o. female here with fall. Fall 12 hours ago. Has some headaches. Jenna [name] did finish her dialysis today. Denies chest pain or syncope. Will get CT head/neck, labs. I ordered xrays of R shoulder and forearm and hip but Jenna [name] didn't want them. Jenna [name] has no obvious deformity and able to ambulate.   11:16 PM BMP showed K 2.5. Repeat 2.4. No vomiting, no abdominal pain. Magnesium normal. I wonder if Jenna [name] had a different dialysate today. Given 2 runs of IV potassium, 40 meq potassium PO. Told her that Jenna [name] needs to mention about this to the dialysis center when Jenna [name] goes there in 2 days.    Final Clinical Impressions(s) / ED Diagnoses   Final diagnoses:  None    ED Discharge Orders    None       Drenda Freeze, MD 08/02/17 2317

## 2017-08-02 NOTE — Telephone Encounter (Signed)
Pt fell in bathroom this am she hit her head. She is at dialysis and received Heparin 9800 units in dialysis solution prior to telling nursing staff at dialysis that she fell this is according to nurse Cindy at the cliinic Pt was advised to go to Er to for evaul. Dustin at the practice notified    Reason for Disposition . Taking Coumadin (warfarin) or other strong blood thinner, or known bleeding disorder (e.g., thrombocytopenia)  Answer Assessment - Initial Assessment Questions 1. MECHANISM: "How did the injury happen?" For falls, ask: "What height did you fall from?" and "What surface did you fall against?"     Fell in bathroom -  Feet got tangled  Fell from standing position Hit head on hard surface    2. ONSET: "When did the injury happen?" (Minutes or hours ago)       7 hours  3. NEUROLOGIC SYMPTOMS: "Was there any loss of consciousness?" "Are there any other neurological symptoms?"         No   4. MENTAL STATUS: "Does the person know who he is, who you are, and where he is?"        Alert and oriented  5. LOCATION: "What part of the head was hit?"        R side  Of head temple area  6. SCALP APPEARANCE: "What does the scalp look like? Is it bleeding now?" If so, ask: "Is it difficult to stop?"       No bleeding  7. SIZE: For cuts, bruises, or swelling, ask: "How large is it?" (e.g., inches or centimeters)        No swelling  Tender to touch  8. PAIN: "Is there any pain?" If so, ask: "How bad is it?"  (e.g., Scale 1-10; or mild, moderate, severe)        4   9. TETANUS: For any breaks in the skin, ask: "When was the last tetanus booster?"       No   10. OTHER SYMPTOMS: "Do you have any other symptoms?" (e.g., neck pain, vomiting)         Pt has pain r wrist and r shoulder   11. PREGNANCY: "Is there any chance you are pregnant?" "When was your last menstrual period?"       n/a  Protocols used: HEAD INJURY-A-AH

## 2017-08-02 NOTE — ED Triage Notes (Signed)
Pt coming from dialysis. Pt fell at 7am, and hit her head. Pt informed staff she had fallen after she received her heparin treatment. Pt denies LOC and remembers the fall. Pt states her head feels tender. BP 130/82, HR80.

## 2017-08-02 NOTE — ED Provider Notes (Signed)
Patient placed in Quick Look pathway, seen and evaluated   Chief Complaint: headache post fall  HPI:   Patient has hx of ESRD on dialysis TuThSa presents for head injury and neck pain after fall 12hrs ago. She fell after she tripped over a chair in her house. Head hit the hardwood floor. Denies LOC. Took Tylenol for pain which helped. However, they were concerned at dialysis because they gave her heparin during it (but this was well after the fall). Sent her over for imaging. Denies blurry vision, vomiting, unilateral weakness. She does state she has been feeling low energy recently.  ROS: headache  Physical Exam:   Gen: No distress  Neuro: Awake and Alert  Skin: Warm    Focused Exam: PERRL. Equal grip strength bilaterally. No facial asymmetry noted. TTP of C-spine at midline and R paraspinal musculature. No changes to ROM.   Initiation of care has begun. The patient has been counseled on the process, plan, and necessity for staying for the completion/evaluation, and the remainder of the medical screening examination    Delia Heady, PA-C 08/02/17 1801    Macarthur Critchley, MD 08/02/17 [phone]

## 2017-08-02 NOTE — Discharge Instructions (Signed)
Your potassium is low. It is 2.5 today.   Please let your dialysis know about this when you go there on Thursday. You may need adjustment to your dialysis fluid on Thursday   See your doctor  Return to ER if you have another fall, dizziness, passing out, chest pain.

## 2017-08-02 NOTE — ED Notes (Signed)
Per MD Darl Householder, hold potassium until BMP comes back

## 2017-08-02 NOTE — ED Notes (Signed)
Pt in room with family @ bedside VS documented Call light within reach

## 2017-08-03 ENCOUNTER — Telehealth: Payer: Self-pay

## 2017-08-03 ENCOUNTER — Telehealth: Payer: Self-pay | Admitting: Family Medicine

## 2017-08-03 DIAGNOSIS — M3215 Tubulo-interstitial nephropathy in systemic lupus erythematosus: Secondary | ICD-10-CM | POA: Diagnosis not present

## 2017-08-03 DIAGNOSIS — E876 Hypokalemia: Secondary | ICD-10-CM | POA: Diagnosis not present

## 2017-08-03 DIAGNOSIS — N186 End stage renal disease: Secondary | ICD-10-CM | POA: Diagnosis not present

## 2017-08-03 DIAGNOSIS — I120 Hypertensive chronic kidney disease with stage 5 chronic kidney disease or end stage renal disease: Secondary | ICD-10-CM | POA: Diagnosis not present

## 2017-08-03 DIAGNOSIS — Z992 Dependence on renal dialysis: Secondary | ICD-10-CM | POA: Diagnosis not present

## 2017-08-03 DIAGNOSIS — D631 Anemia in chronic kidney disease: Secondary | ICD-10-CM | POA: Diagnosis not present

## 2017-08-03 DIAGNOSIS — Z9181 History of falling: Secondary | ICD-10-CM | POA: Diagnosis not present

## 2017-08-03 NOTE — Telephone Encounter (Signed)
Yes please transfer her.

## 2017-08-03 NOTE — Telephone Encounter (Signed)
Copied from [place] [id number]. Topic: Appointment Scheduling - Scheduling Inquiry for Clinic >> Aug 02, 2017  1:08 PM Scherrie Gerlach wrote: Reason for CRM: pt states she established with Dr Volanda Napoleon on 6/12 and did not feel comfortable with the visit.  Pt states Dr Volanda Napoleon did not have a badge on and when she walked into the room she did not know who she was. Pt states Dr Volanda Napoleon did not take any time with her. Pt states Dr Volanda Napoleon told her she was coming back into the room and she never did.  Pt states she was supposed to see Dr [name] to begin with in May, but was in the hospital and could not come. Pt would like to make an appt with Dr [name] to est.  Pt states she does not feel comfortable staying with Dr Volanda Napoleon, as they did not "click". Pt wants to see Dr [name] if ok.   Dr. Volanda Napoleon - Please advise if ok for pt to transfer. Thanks!

## 2017-08-03 NOTE — Telephone Encounter (Signed)
Dr. [name] - Please advise if ok for pt to transfer. Thanks!

## 2017-08-03 NOTE — Telephone Encounter (Signed)
Copied from [place] [id number]. Topic: Quick Communication - See Telephone Encounter >> Aug 03, 2017 11:49 AM Cleaster Corin, NT wrote: CRM for notification. See Telephone encounter for: 08/03/17.  Sherry from [place] [facility] calling to receive verbal orders for pt. For home care approval and skilled nursing 1 week 4 and 3 prn for medication adjustments and status. Judeen Hammans can be reached at [phone]

## 2017-08-03 NOTE — Telephone Encounter (Signed)
Spoke with Anda Kraft with [facility] confirmed that she got the verbal orders on for Pt to start PT.

## 2017-08-04 DIAGNOSIS — D509 Iron deficiency anemia, unspecified: Secondary | ICD-10-CM | POA: Diagnosis not present

## 2017-08-04 DIAGNOSIS — D631 Anemia in chronic kidney disease: Secondary | ICD-10-CM | POA: Diagnosis not present

## 2017-08-04 DIAGNOSIS — N186 End stage renal disease: Secondary | ICD-10-CM | POA: Diagnosis not present

## 2017-08-04 DIAGNOSIS — E876 Hypokalemia: Secondary | ICD-10-CM | POA: Diagnosis not present

## 2017-08-04 NOTE — Telephone Encounter (Signed)
Pt is pending transfer to another provider, Please Advice if ok to give verbal orders to [facility].

## 2017-08-06 DIAGNOSIS — E876 Hypokalemia: Secondary | ICD-10-CM | POA: Diagnosis not present

## 2017-08-06 DIAGNOSIS — D631 Anemia in chronic kidney disease: Secondary | ICD-10-CM | POA: Diagnosis not present

## 2017-08-06 DIAGNOSIS — N186 End stage renal disease: Secondary | ICD-10-CM | POA: Diagnosis not present

## 2017-08-06 DIAGNOSIS — D509 Iron deficiency anemia, unspecified: Secondary | ICD-10-CM | POA: Diagnosis not present

## 2017-08-08 DIAGNOSIS — Z992 Dependence on renal dialysis: Secondary | ICD-10-CM | POA: Diagnosis not present

## 2017-08-08 DIAGNOSIS — I120 Hypertensive chronic kidney disease with stage 5 chronic kidney disease or end stage renal disease: Secondary | ICD-10-CM | POA: Diagnosis not present

## 2017-08-08 DIAGNOSIS — D631 Anemia in chronic kidney disease: Secondary | ICD-10-CM | POA: Diagnosis not present

## 2017-08-08 DIAGNOSIS — Z9181 History of falling: Secondary | ICD-10-CM | POA: Diagnosis not present

## 2017-08-08 DIAGNOSIS — N186 End stage renal disease: Secondary | ICD-10-CM | POA: Diagnosis not present

## 2017-08-08 DIAGNOSIS — M3215 Tubulo-interstitial nephropathy in systemic lupus erythematosus: Secondary | ICD-10-CM | POA: Diagnosis not present

## 2017-08-08 NOTE — Telephone Encounter (Signed)
Per Appt Desk pt did have a No Show with Dr. [name] on 07/01/17. Due to this Dr. [name] will not be able to accept this pt.

## 2017-08-08 NOTE — Telephone Encounter (Signed)
Please check no show Hx, I believe she had a no show with me (to establish care) and decided to establish with Dr Cain Saupe instead.  Thanks, BJ

## 2017-08-09 ENCOUNTER — Telehealth: Payer: Self-pay | Admitting: Family Medicine

## 2017-08-09 ENCOUNTER — Encounter (HOSPITAL_COMMUNITY): Payer: Medicare Other

## 2017-08-09 DIAGNOSIS — D509 Iron deficiency anemia, unspecified: Secondary | ICD-10-CM | POA: Diagnosis not present

## 2017-08-09 DIAGNOSIS — E876 Hypokalemia: Secondary | ICD-10-CM | POA: Diagnosis not present

## 2017-08-09 DIAGNOSIS — D631 Anemia in chronic kidney disease: Secondary | ICD-10-CM | POA: Diagnosis not present

## 2017-08-09 DIAGNOSIS — N186 End stage renal disease: Secondary | ICD-10-CM | POA: Diagnosis not present

## 2017-08-09 NOTE — Telephone Encounter (Signed)
Copied from [place] [id number]. Topic: Inquiry >> Aug 09, 2017  8:57 AM Scherrie Gerlach wrote: Reason for CRM: Izora Gala with [facility] requesting verbal orders for OT. They have not seen the pt yet, and realize pt in the middle of transferring to another provider. But want to know if the initial visits can be approved  2 wk 4

## 2017-08-09 NOTE — Telephone Encounter (Deleted)
Copied from [place] [phone]. Topic: Inquiry >> Aug 09, 2017  8:57 AM Scherrie Gerlach wrote: Reason for CRM: Izora Gala with [facility] requesting verbal orders for OT. They have not seen the pt yet, and realize pt in the middle of transferring to another provider. But want to know if the initial visits can be approved  2 wk 4

## 2017-08-09 NOTE — Telephone Encounter (Signed)
Called Jenna [name] at [place] at [facility] left a message to return my call regarding the request for OT for Jenna [name]

## 2017-08-10 ENCOUNTER — Ambulatory Visit: Payer: Self-pay

## 2017-08-10 DIAGNOSIS — D631 Anemia in chronic kidney disease: Secondary | ICD-10-CM | POA: Diagnosis not present

## 2017-08-10 DIAGNOSIS — N186 End stage renal disease: Secondary | ICD-10-CM | POA: Diagnosis not present

## 2017-08-10 DIAGNOSIS — Z992 Dependence on renal dialysis: Secondary | ICD-10-CM | POA: Diagnosis not present

## 2017-08-10 DIAGNOSIS — M3215 Tubulo-interstitial nephropathy in systemic lupus erythematosus: Secondary | ICD-10-CM | POA: Diagnosis not present

## 2017-08-10 DIAGNOSIS — I120 Hypertensive chronic kidney disease with stage 5 chronic kidney disease or end stage renal disease: Secondary | ICD-10-CM | POA: Diagnosis not present

## 2017-08-10 DIAGNOSIS — Z9181 History of falling: Secondary | ICD-10-CM | POA: Diagnosis not present

## 2017-08-10 NOTE — Telephone Encounter (Signed)
No thanks, not at this time.

## 2017-08-10 NOTE — Telephone Encounter (Signed)
Transfer request sent to Dr. Rogers Blocker in previous phone note.

## 2017-08-10 NOTE — Telephone Encounter (Addendum)
Per 08/03/17 TE, TOC cannot be followed out as both Providers did not agree to the transfer, which the patient should have already been made aware of.  Will send to [place] to advise on next steps.   Message sent back to [facility] as FYI to make aware that there is a message already in the chart(08/03/17) about TOC and the patient needing to stay with Dr Volanda Napoleon at this time. Our phones are currently down and we are unable to reach out to the patient at this time.

## 2017-08-10 NOTE — Telephone Encounter (Signed)
Returned call to pt. Re: symptoms of vaginal bleeding.  Reported she had a mild episode of vaginal bleeding about 2-3 weeks ago, and a 2nd mild episode yesterday.  Reported during hemodialysis yesterday, she had a stomachache, about 1 hr. prior to having mild vaginal bleeding, after she returned home.  Stated there was small amt. Of bright red blood on her panties and some on the toilet tissue, when she wiped.  Reported she has a very small spot of blood on the toilet tissue this AM; otherwise denied further signs of bleeding.  Denied dizziness.  Stated she has had feeling of fatigue after dialysis treatment at times, and yesterday was a difficult day.  Denied feeling weak or tired in general.  C/o bilat. Legs feeling numb during her treatment, and once she lowered her legs, the numbness improved. Stated she has a hard time being positioned in the chair and being reclined for the full, 4 hr. HD treatment, and has experience the numbness with previous treatments.   Denied leg numbness at this time.  No other c/o of leg discomfort.    Recommended appt. with PCP within nest 1-2 weeks, to eval. The vaginal bleeding.  Stated she does not have a GYN specialist.  In scheduling the appt., offered appt. On 6/28 with PCP, Dr. Volanda Napoleon.  The pt. stated she prefers to establish care with Dr. [name].  Stated she initially had expressed this as her preference, and was scheduled on 6/12 with Dr. Volanda Napoleon.  Advised will send message to the office to request appt. And establishment of care be scheduled with Dr. [name].  Pt. Agrees with plan.  Care advice given per protocol.  Verb. Understanding.                Reason for Disposition . Postmenopausal vaginal bleeding  Answer Assessment - Initial Assessment Questions 1. AMOUNT: "Describe the bleeding that you are having." "How much bleeding is there?"    - SPOTTING: spotting, or pinkish / brownish mucous discharge; does not fill panti-liner or pad    - MILD:  less than 1 pad /  hour; less than patient's  menstrual bleeding when she still had menstrual periods   - MODERATE: 1-2 pads / hour; small-medium blood clots (e.g., pea, grape, small coin)    - SEVERE: soaking 2 or more pads/hour for 2 or more hours; bleeding not contained by pads or continuous red blood from vagina; large blood clots (e.g., golf ball, large coin)      Mild amt. Vaginal bleeding 2. ONSET: "When did the bleeding begin?" "Is it continuing now?"     One episode 2-3 weeks ago; mild.  2nd episode yesterday; mild 3. MENOPAUSE: "When was your last menstrual period?"      At least 4-5 years ago.  4. ABDOMINAL PAIN: "Do you have any pain?" "How bad is the pain?"  (e.g., Scale 1-10; mild, moderate, or severe)   - MILD (1-3): doesn't interfere with normal activities, abdomen soft and not tender to touch    - MODERATE (4-7): interferes with normal activities or awakens from sleep, tender to touch    - SEVERE (8-10): excruciating pain, doubled over, unable to do any normal activities      Moderate  5. BLOOD THINNERS: "Do you take any blood thinners?" (e.g., Coumadin/warfarin, Pradaxa/dabigatran, aspirin)     denied 6. HORMONES: "Are you taking any hormone medications, prescription or OTC?" (e.g., birth control pills, estrogen)     No  7. CAUSE: "What  do you think is causing the bleeding?" (e.g., recent gyn surgery, recent gyn procedure; known bleeding disorder, uterine cancer)       unknown 8. HEMODYNAMIC STATUS: "Are you weak or feeling lightheaded?" If so, ask: "Can you stand and walk normally?"       Denied light-headed at time of bleeding  9. OTHER SYMPTOMS: "What other symptoms are you having with the bleeding?" (e.g., back pain, burning with urination, fever)     Had stomach ache yesterday during hemodialysis treatment;  Had numbness in her legs during dialysis over 4 hr. period; improved with putting legs down.  Protocols used: VAGINAL BLEEDING - POSTMENOPAUSAL-A-AH  Message from Yvette Rack  sent at 08/10/2017 9:41 AM EDT   Pt calling stating that she had dialysis yesterday and she had vaginal bleeding and is worried about the bleeding she states that she hasnt had a period in several years she states that this has happen twice since she has had treatment she had numbness in her legs And she feel so fatigued

## 2017-08-10 NOTE — Telephone Encounter (Signed)
Patient requesting to transfer to Dr. Rogers Blocker at [facility]. Dr. Rogers Blocker, will you accept patient? She was hospitalized at the time of her previous new patient appt w/ Dr. [name].

## 2017-08-10 NOTE — Telephone Encounter (Signed)
Called pt.; advised that Dr. [name] is not accepting new patients at this time.  Advised would need to schedule an acute care visit to have vaginal bleeding evaluated.  Explained that the transfer of care will need to be approved by the current MD and the MD she would like to transfer care to.  The pt. Stated she prefers to have an MD for PCP, and does prefer a female.  Advised will make [facility] at [facility] aware.  Agrees with plan.  Pt. Accepted appt. On 08/12/17 @ 1:00 PM for evaluation of vaginal bleeding.

## 2017-08-11 DIAGNOSIS — D631 Anemia in chronic kidney disease: Secondary | ICD-10-CM | POA: Diagnosis not present

## 2017-08-11 DIAGNOSIS — N186 End stage renal disease: Secondary | ICD-10-CM | POA: Diagnosis not present

## 2017-08-11 DIAGNOSIS — D509 Iron deficiency anemia, unspecified: Secondary | ICD-10-CM | POA: Diagnosis not present

## 2017-08-11 DIAGNOSIS — E876 Hypokalemia: Secondary | ICD-10-CM | POA: Diagnosis not present

## 2017-08-11 NOTE — Telephone Encounter (Signed)
Copied from [place]. Topic: General - Other >> Aug 10, 2017  9:30 AM Yvette Rack wrote: Reason for CRM: pt calling to switch from [place] to [name] because that who she had wanted to see at first she didn't think Dr Volanda Napoleon was a good fit for her   >> Aug 10, 2017  1:11 PM Cox, Melburn Hake, CMA wrote: Duplicate message.  >> Aug 11, 2017  9:38 AM Celedonio Savage L wrote: Pt calling back about the transfer from [place] to [name] please give her a call back at [phone]   Dr. [name] - Pt was hospitalized at the time of the No Show on your schedule. Please advise if pt can transfer care to you or if we need to explore another office. Thanks!

## 2017-08-12 ENCOUNTER — Ambulatory Visit: Payer: Medicare Other | Admitting: Family Medicine

## 2017-08-12 NOTE — Telephone Encounter (Signed)
>>   Aug 11, 2017  3:42 PM Percell Belt A wrote: Sherri with [facility].  She called in and stated that if they don't get approval for nursing and OT soon then they will have to pull this case.  They are needing approval for frequency. OT  2 week 6  Nursing  1 week 4   Best number [phone]   She is just needing a yes or no

## 2017-08-12 NOTE — Telephone Encounter (Signed)
Spoke with Sherri with [facility] gave verbal orders for OT and Nursing on Ms Chamber.

## 2017-08-12 NOTE — Telephone Encounter (Signed)
Called Sherri with [facility] left a message to call the office back regarding verbal orders for nursing and OT for pt Jenna [name].

## 2017-08-12 NOTE — Telephone Encounter (Signed)
Sherrie returning call to [place]. Please give Sherrie a call when available.

## 2017-08-13 DIAGNOSIS — N186 End stage renal disease: Secondary | ICD-10-CM | POA: Diagnosis not present

## 2017-08-13 DIAGNOSIS — E876 Hypokalemia: Secondary | ICD-10-CM | POA: Diagnosis not present

## 2017-08-13 DIAGNOSIS — D509 Iron deficiency anemia, unspecified: Secondary | ICD-10-CM | POA: Diagnosis not present

## 2017-08-13 DIAGNOSIS — D631 Anemia in chronic kidney disease: Secondary | ICD-10-CM | POA: Diagnosis not present

## 2017-08-15 ENCOUNTER — Ambulatory Visit (HOSPITAL_COMMUNITY)
Admission: RE | Admit: 2017-08-15 | Discharge: 2017-08-15 | Disposition: A | Discharge status: Home / Self Care | Payer: Medicare Other | Source: Ambulatory Visit | Attending: Vascular Surgery | Admitting: Vascular Surgery

## 2017-08-15 ENCOUNTER — Ambulatory Visit (INDEPENDENT_AMBULATORY_CARE_PROVIDER_SITE_OTHER): Payer: Self-pay | Admitting: Physician Assistant

## 2017-08-15 ENCOUNTER — Other Ambulatory Visit: Payer: Self-pay

## 2017-08-15 ENCOUNTER — Encounter: Payer: Self-pay | Admitting: Physician Assistant

## 2017-08-15 VITALS — BP 124/87 | HR 81 | Temp 97.4°F | Resp 16 | Ht 67.0 in | Wt 209.0 lb

## 2017-08-15 DIAGNOSIS — Z9181 History of falling: Secondary | ICD-10-CM | POA: Diagnosis not present

## 2017-08-15 DIAGNOSIS — N185 Chronic kidney disease, stage 5: Secondary | ICD-10-CM | POA: Insufficient documentation

## 2017-08-15 DIAGNOSIS — N186 End stage renal disease: Secondary | ICD-10-CM | POA: Diagnosis not present

## 2017-08-15 DIAGNOSIS — Z48812 Encounter for surgical aftercare following surgery on the circulatory system: Secondary | ICD-10-CM | POA: Diagnosis not present

## 2017-08-15 DIAGNOSIS — D509 Iron deficiency anemia, unspecified: Secondary | ICD-10-CM | POA: Diagnosis not present

## 2017-08-15 DIAGNOSIS — Z992 Dependence on renal dialysis: Secondary | ICD-10-CM

## 2017-08-15 DIAGNOSIS — M3215 Tubulo-interstitial nephropathy in systemic lupus erythematosus: Secondary | ICD-10-CM | POA: Diagnosis not present

## 2017-08-15 DIAGNOSIS — D631 Anemia in chronic kidney disease: Secondary | ICD-10-CM | POA: Diagnosis not present

## 2017-08-15 DIAGNOSIS — Z95828 Presence of other vascular implants and grafts: Secondary | ICD-10-CM | POA: Diagnosis not present

## 2017-08-15 DIAGNOSIS — I120 Hypertensive chronic kidney disease with stage 5 chronic kidney disease or end stage renal disease: Secondary | ICD-10-CM | POA: Diagnosis not present

## 2017-08-15 DIAGNOSIS — T8249XA Other complication of vascular dialysis catheter, initial encounter: Secondary | ICD-10-CM | POA: Diagnosis not present

## 2017-08-15 DIAGNOSIS — M3214 Glomerular disease in systemic lupus erythematosus: Secondary | ICD-10-CM | POA: Diagnosis not present

## 2017-08-15 NOTE — Telephone Encounter (Signed)
Please see notes below as Dr. Rogers Blocker has declined the transfer of care after review. Patient is scheduled on Dr Rogers Blocker for 08/17/17 for a transfer of care appointment which needs to be cancelled per policy. Please cancel appointment and follow policy to obtain authorization for a transfer appointment with another provider.   Thank you

## 2017-08-15 NOTE — Progress Notes (Signed)
POST OPERATIVE OFFICE NOTE    CC:  F/u for surgery  HPI:  This is a 62 y.o. female who is s/p left brachiocephalic AV fistula by Dr. Donnetta Hutching on 07/04/17.  She had her tunneled dialysis catheter placed in IR on Jun 28, 2017.  She states that she dialyzes T/T/S on [facility].  She states that she has done well since surgery.  She denies any pain or numbness in her left hand.  She states she did have some left shoulder pain, but this resolved.  She returns today for follow up.    Allergies  Allergen Reactions  . Enalapril Maleate Anaphylaxis and Other (See Comments)    Throat swells  . Tape Itching and Other (See Comments)    Irritates the skin  . Chocolate Nausea And Vomiting  . Penicillins Other (See Comments)    Made patient lightheaded Has patient had a PCN reaction causing immediate rash, facial/tongue/throat swelling, SOB or lightheadedness with hypotension: Yes Has patient had a PCN reaction causing severe rash involving mucus membranes or skin necrosis: No Has patient had a PCN reaction that required hospitalization: No Has patient had a PCN reaction occurring within the last 10 years: No If all of the above answers are "NO", then may proceed with Cephalosporin use.     Current Outpatient Medications  Medication Sig Dispense Refill  . acetaminophen (TYLENOL) 500 MG tablet Take 1,000 mg by mouth every 6 (six) hours as needed for headache.    Marland Kitchen aspirin 81 MG tablet Take 1 tablet (81 mg total) by mouth daily. (Patient taking differently: Take 81 mg by mouth 2 (two) times a week. ) 90 tablet 3  . HYDROcodone-acetaminophen (NORCO) 7.5-325 MG tablet Take 1 tablet by mouth every 6 (six) hours as needed for moderate pain or severe pain. (Patient not taking: Reported on 08/02/2017) 15 tablet 0  . hydroxychloroquine (PLAQUENIL) 200 MG tablet Take 1 tablet (200 mg total) by mouth 2 (two) times daily. 180 tablet 3  . multivitamin (RENA-VIT) TABS tablet Take 1 tablet by mouth at bedtime. (Patient  not taking: Reported on 08/02/2017)  0   No current facility-administered medications for this visit.      ROS:  See HPI  Physical Exam:  Vitals:   08/15/17 1510  BP: 124/87  Pulse: 81  Resp: 16  Temp: (!) 97.4 F (36.3 C)  SpO2: 98%   Vitals:   08/15/17 1510  Weight: 209 lb (94.8 kg)  Height: $Remove'5\' 7"'ojxCtEZ$  (1.702 m)   Body mass index is 32.73 kg/m.  Incision:  Well healed Extremities:  Easily palpable left radial pulse; there is an excellent thrill/bruit within the fistula but becomes a little more difficult to palpate in the proximal portion of the arm.  Motor and sensation are in tact left hand.    Dialysis duplex 08/15/17: Diameter:  0.62cm-1.16cm Depth:  0.12cm-0.99cm   Assessment/Plan:  This is a 62 y.o. female who is s/p: Left brachiocephalic AV fistula on 8/33/82 by Dr. Donnetta Hutching & right IJ tunneled dialysis catheter placed by IR on 06/28/17.  -fistula is maturing nicely and has an excellent thrill.  It becomes a little more difficult to feel in the proximal upper arm but should have enough to cannulate distally.  Duplex reveals that it is maturing nicely and the depth should be satisfactory.   Discussed with pt they can start using fistula on 10/04/17.  If she has any difficulty, she may need superficialization in the proximal portion of the fistula  in the future.  -once the fistula has been used 2-3 times successfully, the tunneled catheter can be removed.    Leontine Locket, PA-C [facility] [phone]  Clinic MD:  Trula Slade

## 2017-08-16 DIAGNOSIS — N186 End stage renal disease: Secondary | ICD-10-CM | POA: Diagnosis not present

## 2017-08-16 DIAGNOSIS — D509 Iron deficiency anemia, unspecified: Secondary | ICD-10-CM | POA: Diagnosis not present

## 2017-08-16 DIAGNOSIS — T8249XA Other complication of vascular dialysis catheter, initial encounter: Secondary | ICD-10-CM | POA: Diagnosis not present

## 2017-08-16 DIAGNOSIS — D631 Anemia in chronic kidney disease: Secondary | ICD-10-CM | POA: Diagnosis not present

## 2017-08-17 ENCOUNTER — Encounter: Payer: Medicare Other | Admitting: Family Medicine

## 2017-08-18 DIAGNOSIS — D509 Iron deficiency anemia, unspecified: Secondary | ICD-10-CM | POA: Diagnosis not present

## 2017-08-18 DIAGNOSIS — N186 End stage renal disease: Secondary | ICD-10-CM | POA: Diagnosis not present

## 2017-08-18 DIAGNOSIS — D631 Anemia in chronic kidney disease: Secondary | ICD-10-CM | POA: Diagnosis not present

## 2017-08-18 DIAGNOSIS — T8249XA Other complication of vascular dialysis catheter, initial encounter: Secondary | ICD-10-CM | POA: Diagnosis not present

## 2017-08-19 DIAGNOSIS — Z992 Dependence on renal dialysis: Secondary | ICD-10-CM | POA: Diagnosis not present

## 2017-08-19 DIAGNOSIS — M3215 Tubulo-interstitial nephropathy in systemic lupus erythematosus: Secondary | ICD-10-CM | POA: Diagnosis not present

## 2017-08-19 DIAGNOSIS — N186 End stage renal disease: Secondary | ICD-10-CM | POA: Diagnosis not present

## 2017-08-19 DIAGNOSIS — D631 Anemia in chronic kidney disease: Secondary | ICD-10-CM | POA: Diagnosis not present

## 2017-08-19 DIAGNOSIS — Z9181 History of falling: Secondary | ICD-10-CM | POA: Diagnosis not present

## 2017-08-19 DIAGNOSIS — I120 Hypertensive chronic kidney disease with stage 5 chronic kidney disease or end stage renal disease: Secondary | ICD-10-CM | POA: Diagnosis not present

## 2017-08-20 DIAGNOSIS — D631 Anemia in chronic kidney disease: Secondary | ICD-10-CM | POA: Diagnosis not present

## 2017-08-20 DIAGNOSIS — T8249XA Other complication of vascular dialysis catheter, initial encounter: Secondary | ICD-10-CM | POA: Diagnosis not present

## 2017-08-20 DIAGNOSIS — D509 Iron deficiency anemia, unspecified: Secondary | ICD-10-CM | POA: Diagnosis not present

## 2017-08-20 DIAGNOSIS — N186 End stage renal disease: Secondary | ICD-10-CM | POA: Diagnosis not present

## 2017-08-22 DIAGNOSIS — I120 Hypertensive chronic kidney disease with stage 5 chronic kidney disease or end stage renal disease: Secondary | ICD-10-CM | POA: Diagnosis not present

## 2017-08-22 DIAGNOSIS — M3215 Tubulo-interstitial nephropathy in systemic lupus erythematosus: Secondary | ICD-10-CM | POA: Diagnosis not present

## 2017-08-22 DIAGNOSIS — Z9181 History of falling: Secondary | ICD-10-CM | POA: Diagnosis not present

## 2017-08-22 DIAGNOSIS — N186 End stage renal disease: Secondary | ICD-10-CM | POA: Diagnosis not present

## 2017-08-22 DIAGNOSIS — Z992 Dependence on renal dialysis: Secondary | ICD-10-CM | POA: Diagnosis not present

## 2017-08-22 DIAGNOSIS — D631 Anemia in chronic kidney disease: Secondary | ICD-10-CM | POA: Diagnosis not present

## 2017-08-23 DIAGNOSIS — D631 Anemia in chronic kidney disease: Secondary | ICD-10-CM | POA: Diagnosis not present

## 2017-08-23 DIAGNOSIS — N186 End stage renal disease: Secondary | ICD-10-CM | POA: Diagnosis not present

## 2017-08-23 DIAGNOSIS — D509 Iron deficiency anemia, unspecified: Secondary | ICD-10-CM | POA: Diagnosis not present

## 2017-08-23 DIAGNOSIS — T8249XA Other complication of vascular dialysis catheter, initial encounter: Secondary | ICD-10-CM | POA: Diagnosis not present

## 2017-08-24 DIAGNOSIS — Z9181 History of falling: Secondary | ICD-10-CM | POA: Diagnosis not present

## 2017-08-24 DIAGNOSIS — E669 Obesity, unspecified: Secondary | ICD-10-CM | POA: Diagnosis not present

## 2017-08-24 DIAGNOSIS — F3289 Other specified depressive episodes: Secondary | ICD-10-CM | POA: Diagnosis not present

## 2017-08-24 DIAGNOSIS — N186 End stage renal disease: Secondary | ICD-10-CM | POA: Diagnosis not present

## 2017-08-24 DIAGNOSIS — M3214 Glomerular disease in systemic lupus erythematosus: Secondary | ICD-10-CM | POA: Diagnosis not present

## 2017-08-24 DIAGNOSIS — M150 Primary generalized (osteo)arthritis: Secondary | ICD-10-CM | POA: Diagnosis not present

## 2017-08-24 DIAGNOSIS — R29818 Other symptoms and signs involving the nervous system: Secondary | ICD-10-CM | POA: Diagnosis not present

## 2017-08-24 DIAGNOSIS — M170 Bilateral primary osteoarthritis of knee: Secondary | ICD-10-CM | POA: Diagnosis not present

## 2017-08-24 DIAGNOSIS — Z992 Dependence on renal dialysis: Secondary | ICD-10-CM | POA: Diagnosis not present

## 2017-08-24 DIAGNOSIS — M546 Pain in thoracic spine: Secondary | ICD-10-CM | POA: Diagnosis not present

## 2017-08-24 DIAGNOSIS — Z6832 Body mass index (BMI) 32.0-32.9, adult: Secondary | ICD-10-CM | POA: Diagnosis not present

## 2017-08-24 DIAGNOSIS — M3215 Tubulo-interstitial nephropathy in systemic lupus erythematosus: Secondary | ICD-10-CM | POA: Diagnosis not present

## 2017-08-24 DIAGNOSIS — M25561 Pain in right knee: Secondary | ICD-10-CM | POA: Diagnosis not present

## 2017-08-24 DIAGNOSIS — D631 Anemia in chronic kidney disease: Secondary | ICD-10-CM | POA: Diagnosis not present

## 2017-08-24 DIAGNOSIS — M329 Systemic lupus erythematosus, unspecified: Secondary | ICD-10-CM | POA: Diagnosis not present

## 2017-08-24 DIAGNOSIS — I120 Hypertensive chronic kidney disease with stage 5 chronic kidney disease or end stage renal disease: Secondary | ICD-10-CM | POA: Diagnosis not present

## 2017-08-25 DIAGNOSIS — D631 Anemia in chronic kidney disease: Secondary | ICD-10-CM | POA: Diagnosis not present

## 2017-08-25 DIAGNOSIS — N186 End stage renal disease: Secondary | ICD-10-CM | POA: Diagnosis not present

## 2017-08-25 DIAGNOSIS — D509 Iron deficiency anemia, unspecified: Secondary | ICD-10-CM | POA: Diagnosis not present

## 2017-08-25 DIAGNOSIS — T8249XA Other complication of vascular dialysis catheter, initial encounter: Secondary | ICD-10-CM | POA: Diagnosis not present

## 2017-08-26 DIAGNOSIS — Z992 Dependence on renal dialysis: Secondary | ICD-10-CM | POA: Diagnosis not present

## 2017-08-26 DIAGNOSIS — D631 Anemia in chronic kidney disease: Secondary | ICD-10-CM | POA: Diagnosis not present

## 2017-08-26 DIAGNOSIS — Z9181 History of falling: Secondary | ICD-10-CM | POA: Diagnosis not present

## 2017-08-26 DIAGNOSIS — I120 Hypertensive chronic kidney disease with stage 5 chronic kidney disease or end stage renal disease: Secondary | ICD-10-CM | POA: Diagnosis not present

## 2017-08-26 DIAGNOSIS — M3215 Tubulo-interstitial nephropathy in systemic lupus erythematosus: Secondary | ICD-10-CM | POA: Diagnosis not present

## 2017-08-26 DIAGNOSIS — N186 End stage renal disease: Secondary | ICD-10-CM | POA: Diagnosis not present

## 2017-08-27 DIAGNOSIS — D631 Anemia in chronic kidney disease: Secondary | ICD-10-CM | POA: Diagnosis not present

## 2017-08-27 DIAGNOSIS — T8249XA Other complication of vascular dialysis catheter, initial encounter: Secondary | ICD-10-CM | POA: Diagnosis not present

## 2017-08-27 DIAGNOSIS — N186 End stage renal disease: Secondary | ICD-10-CM | POA: Diagnosis not present

## 2017-08-27 DIAGNOSIS — D509 Iron deficiency anemia, unspecified: Secondary | ICD-10-CM | POA: Diagnosis not present

## 2017-08-29 DIAGNOSIS — Z992 Dependence on renal dialysis: Secondary | ICD-10-CM | POA: Diagnosis not present

## 2017-08-29 DIAGNOSIS — I120 Hypertensive chronic kidney disease with stage 5 chronic kidney disease or end stage renal disease: Secondary | ICD-10-CM | POA: Diagnosis not present

## 2017-08-29 DIAGNOSIS — D631 Anemia in chronic kidney disease: Secondary | ICD-10-CM | POA: Diagnosis not present

## 2017-08-29 DIAGNOSIS — M3215 Tubulo-interstitial nephropathy in systemic lupus erythematosus: Secondary | ICD-10-CM | POA: Diagnosis not present

## 2017-08-29 DIAGNOSIS — Z9181 History of falling: Secondary | ICD-10-CM | POA: Diagnosis not present

## 2017-08-29 DIAGNOSIS — N186 End stage renal disease: Secondary | ICD-10-CM | POA: Diagnosis not present

## 2017-08-30 ENCOUNTER — Telehealth: Payer: Self-pay

## 2017-08-30 DIAGNOSIS — T8249XA Other complication of vascular dialysis catheter, initial encounter: Secondary | ICD-10-CM | POA: Diagnosis not present

## 2017-08-30 DIAGNOSIS — N186 End stage renal disease: Secondary | ICD-10-CM | POA: Diagnosis not present

## 2017-08-30 DIAGNOSIS — D631 Anemia in chronic kidney disease: Secondary | ICD-10-CM | POA: Diagnosis not present

## 2017-08-30 DIAGNOSIS — D509 Iron deficiency anemia, unspecified: Secondary | ICD-10-CM | POA: Diagnosis not present

## 2017-08-30 NOTE — Telephone Encounter (Signed)
Copied from [place] [id number]. Topic: Quick Communication - See Telephone Encounter >> Aug 30, 2017  9:42 AM Marja Kays F wrote: Pt is calling back in regards to getting an appt with she believes is dr. [name] instead of Dr. Volanda Napoleon please read the CRM from 07/1917  Pt  states she is better now and needs to schedule with someone she is just not sure who she has gotten approved to see  Best number  [phone]

## 2017-08-30 NOTE — Telephone Encounter (Signed)
Pt called in on 08/03/17 requesting TOC from Dr Volanda Napoleon to Dr [name]. TOC was okayed per Dr Volanda Napoleon but seeing that that there was a previous NS on her account for Dr [name] when she was scheduled for a NP appt TOC was declined.  On 07/01/17 the patient was scheduled to see Dr [name] for NP [place] and she no showed this appt because she was admitted to the hospital. There is a telephone encounter on 06/28/17 where the patient called in stating that she was likely going to still be in the hospital the day of her upcoming appt with Dr [name] and the patient was advised that this appt should be cancelled and rescheduled once she was discharged home. The appt was never cancelled by the patient or our office -- should have been cancelled by our office the day of the TE conversation with the patient. When Dr [name] declined the [facility] she was unaware of the circumstances as to why the patient no showed her Establish Care appt.   Will send this back to Dr [name] to ask if she would reconsider taking over care of this patient.

## 2017-08-31 DIAGNOSIS — Z79899 Other long term (current) drug therapy: Secondary | ICD-10-CM | POA: Diagnosis not present

## 2017-08-31 DIAGNOSIS — D631 Anemia in chronic kidney disease: Secondary | ICD-10-CM | POA: Diagnosis not present

## 2017-08-31 DIAGNOSIS — Z992 Dependence on renal dialysis: Secondary | ICD-10-CM | POA: Diagnosis not present

## 2017-08-31 DIAGNOSIS — I120 Hypertensive chronic kidney disease with stage 5 chronic kidney disease or end stage renal disease: Secondary | ICD-10-CM | POA: Diagnosis not present

## 2017-08-31 DIAGNOSIS — M3215 Tubulo-interstitial nephropathy in systemic lupus erythematosus: Secondary | ICD-10-CM | POA: Diagnosis not present

## 2017-08-31 DIAGNOSIS — Z9181 History of falling: Secondary | ICD-10-CM | POA: Diagnosis not present

## 2017-08-31 DIAGNOSIS — N186 End stage renal disease: Secondary | ICD-10-CM | POA: Diagnosis not present

## 2017-09-01 DIAGNOSIS — D631 Anemia in chronic kidney disease: Secondary | ICD-10-CM | POA: Diagnosis not present

## 2017-09-01 DIAGNOSIS — N186 End stage renal disease: Secondary | ICD-10-CM | POA: Diagnosis not present

## 2017-09-01 DIAGNOSIS — T8249XA Other complication of vascular dialysis catheter, initial encounter: Secondary | ICD-10-CM | POA: Diagnosis not present

## 2017-09-01 DIAGNOSIS — D509 Iron deficiency anemia, unspecified: Secondary | ICD-10-CM | POA: Diagnosis not present

## 2017-09-02 NOTE — Telephone Encounter (Signed)
Pt needs New Patient Appt with Dr [name]

## 2017-09-02 NOTE — Telephone Encounter (Signed)
It is Ok with me.

## 2017-09-03 DIAGNOSIS — D631 Anemia in chronic kidney disease: Secondary | ICD-10-CM | POA: Diagnosis not present

## 2017-09-03 DIAGNOSIS — T8249XA Other complication of vascular dialysis catheter, initial encounter: Secondary | ICD-10-CM | POA: Diagnosis not present

## 2017-09-03 DIAGNOSIS — D509 Iron deficiency anemia, unspecified: Secondary | ICD-10-CM | POA: Diagnosis not present

## 2017-09-03 DIAGNOSIS — N186 End stage renal disease: Secondary | ICD-10-CM | POA: Diagnosis not present

## 2017-09-05 DIAGNOSIS — D631 Anemia in chronic kidney disease: Secondary | ICD-10-CM | POA: Diagnosis not present

## 2017-09-05 DIAGNOSIS — Z992 Dependence on renal dialysis: Secondary | ICD-10-CM | POA: Diagnosis not present

## 2017-09-05 DIAGNOSIS — M3215 Tubulo-interstitial nephropathy in systemic lupus erythematosus: Secondary | ICD-10-CM | POA: Diagnosis not present

## 2017-09-05 DIAGNOSIS — I120 Hypertensive chronic kidney disease with stage 5 chronic kidney disease or end stage renal disease: Secondary | ICD-10-CM | POA: Diagnosis not present

## 2017-09-05 DIAGNOSIS — N186 End stage renal disease: Secondary | ICD-10-CM | POA: Diagnosis not present

## 2017-09-05 DIAGNOSIS — Z9181 History of falling: Secondary | ICD-10-CM | POA: Diagnosis not present

## 2017-09-06 DIAGNOSIS — R634 Abnormal weight loss: Secondary | ICD-10-CM | POA: Diagnosis not present

## 2017-09-06 DIAGNOSIS — Z86010 Personal history of colonic polyps: Secondary | ICD-10-CM | POA: Diagnosis not present

## 2017-09-06 DIAGNOSIS — D631 Anemia in chronic kidney disease: Secondary | ICD-10-CM | POA: Diagnosis not present

## 2017-09-06 DIAGNOSIS — N186 End stage renal disease: Secondary | ICD-10-CM | POA: Diagnosis not present

## 2017-09-06 DIAGNOSIS — T8249XA Other complication of vascular dialysis catheter, initial encounter: Secondary | ICD-10-CM | POA: Diagnosis not present

## 2017-09-06 DIAGNOSIS — Z1211 Encounter for screening for malignant neoplasm of colon: Secondary | ICD-10-CM | POA: Diagnosis not present

## 2017-09-06 DIAGNOSIS — D509 Iron deficiency anemia, unspecified: Secondary | ICD-10-CM | POA: Diagnosis not present

## 2017-09-06 DIAGNOSIS — K5900 Constipation, unspecified: Secondary | ICD-10-CM | POA: Diagnosis not present

## 2017-09-07 DIAGNOSIS — I120 Hypertensive chronic kidney disease with stage 5 chronic kidney disease or end stage renal disease: Secondary | ICD-10-CM | POA: Diagnosis not present

## 2017-09-07 DIAGNOSIS — Z9181 History of falling: Secondary | ICD-10-CM | POA: Diagnosis not present

## 2017-09-07 DIAGNOSIS — M3215 Tubulo-interstitial nephropathy in systemic lupus erythematosus: Secondary | ICD-10-CM | POA: Diagnosis not present

## 2017-09-07 DIAGNOSIS — D631 Anemia in chronic kidney disease: Secondary | ICD-10-CM | POA: Diagnosis not present

## 2017-09-07 DIAGNOSIS — Z992 Dependence on renal dialysis: Secondary | ICD-10-CM | POA: Diagnosis not present

## 2017-09-07 DIAGNOSIS — N186 End stage renal disease: Secondary | ICD-10-CM | POA: Diagnosis not present

## 2017-09-08 DIAGNOSIS — D509 Iron deficiency anemia, unspecified: Secondary | ICD-10-CM | POA: Diagnosis not present

## 2017-09-08 DIAGNOSIS — T8249XA Other complication of vascular dialysis catheter, initial encounter: Secondary | ICD-10-CM | POA: Diagnosis not present

## 2017-09-08 DIAGNOSIS — D631 Anemia in chronic kidney disease: Secondary | ICD-10-CM | POA: Diagnosis not present

## 2017-09-08 DIAGNOSIS — N186 End stage renal disease: Secondary | ICD-10-CM | POA: Diagnosis not present

## 2017-09-09 DIAGNOSIS — D631 Anemia in chronic kidney disease: Secondary | ICD-10-CM | POA: Diagnosis not present

## 2017-09-09 DIAGNOSIS — M3215 Tubulo-interstitial nephropathy in systemic lupus erythematosus: Secondary | ICD-10-CM | POA: Diagnosis not present

## 2017-09-09 DIAGNOSIS — N186 End stage renal disease: Secondary | ICD-10-CM | POA: Diagnosis not present

## 2017-09-09 DIAGNOSIS — Z9181 History of falling: Secondary | ICD-10-CM | POA: Diagnosis not present

## 2017-09-09 DIAGNOSIS — Z992 Dependence on renal dialysis: Secondary | ICD-10-CM | POA: Diagnosis not present

## 2017-09-09 DIAGNOSIS — I120 Hypertensive chronic kidney disease with stage 5 chronic kidney disease or end stage renal disease: Secondary | ICD-10-CM | POA: Diagnosis not present

## 2017-09-10 DIAGNOSIS — D631 Anemia in chronic kidney disease: Secondary | ICD-10-CM | POA: Diagnosis not present

## 2017-09-10 DIAGNOSIS — N186 End stage renal disease: Secondary | ICD-10-CM | POA: Diagnosis not present

## 2017-09-10 DIAGNOSIS — T8249XA Other complication of vascular dialysis catheter, initial encounter: Secondary | ICD-10-CM | POA: Diagnosis not present

## 2017-09-10 DIAGNOSIS — D509 Iron deficiency anemia, unspecified: Secondary | ICD-10-CM | POA: Diagnosis not present

## 2017-09-13 DIAGNOSIS — D631 Anemia in chronic kidney disease: Secondary | ICD-10-CM | POA: Diagnosis not present

## 2017-09-13 DIAGNOSIS — T8249XA Other complication of vascular dialysis catheter, initial encounter: Secondary | ICD-10-CM | POA: Diagnosis not present

## 2017-09-13 DIAGNOSIS — N186 End stage renal disease: Secondary | ICD-10-CM | POA: Diagnosis not present

## 2017-09-13 DIAGNOSIS — D509 Iron deficiency anemia, unspecified: Secondary | ICD-10-CM | POA: Diagnosis not present

## 2017-09-14 DIAGNOSIS — N186 End stage renal disease: Secondary | ICD-10-CM | POA: Diagnosis not present

## 2017-09-14 DIAGNOSIS — M3215 Tubulo-interstitial nephropathy in systemic lupus erythematosus: Secondary | ICD-10-CM | POA: Diagnosis not present

## 2017-09-14 DIAGNOSIS — D631 Anemia in chronic kidney disease: Secondary | ICD-10-CM | POA: Diagnosis not present

## 2017-09-14 DIAGNOSIS — Z9181 History of falling: Secondary | ICD-10-CM | POA: Diagnosis not present

## 2017-09-14 DIAGNOSIS — I120 Hypertensive chronic kidney disease with stage 5 chronic kidney disease or end stage renal disease: Secondary | ICD-10-CM | POA: Diagnosis not present

## 2017-09-14 DIAGNOSIS — Z992 Dependence on renal dialysis: Secondary | ICD-10-CM | POA: Diagnosis not present

## 2017-09-15 DIAGNOSIS — D509 Iron deficiency anemia, unspecified: Secondary | ICD-10-CM | POA: Diagnosis not present

## 2017-09-15 DIAGNOSIS — Z9181 History of falling: Secondary | ICD-10-CM | POA: Diagnosis not present

## 2017-09-15 DIAGNOSIS — D631 Anemia in chronic kidney disease: Secondary | ICD-10-CM | POA: Diagnosis not present

## 2017-09-15 DIAGNOSIS — Z992 Dependence on renal dialysis: Secondary | ICD-10-CM | POA: Diagnosis not present

## 2017-09-15 DIAGNOSIS — M3215 Tubulo-interstitial nephropathy in systemic lupus erythematosus: Secondary | ICD-10-CM | POA: Diagnosis not present

## 2017-09-15 DIAGNOSIS — N2581 Secondary hyperparathyroidism of renal origin: Secondary | ICD-10-CM | POA: Diagnosis not present

## 2017-09-15 DIAGNOSIS — N186 End stage renal disease: Secondary | ICD-10-CM | POA: Diagnosis not present

## 2017-09-15 DIAGNOSIS — I120 Hypertensive chronic kidney disease with stage 5 chronic kidney disease or end stage renal disease: Secondary | ICD-10-CM | POA: Diagnosis not present

## 2017-09-15 DIAGNOSIS — M3214 Glomerular disease in systemic lupus erythematosus: Secondary | ICD-10-CM | POA: Diagnosis not present

## 2017-09-16 DIAGNOSIS — I120 Hypertensive chronic kidney disease with stage 5 chronic kidney disease or end stage renal disease: Secondary | ICD-10-CM | POA: Diagnosis not present

## 2017-09-16 DIAGNOSIS — N186 End stage renal disease: Secondary | ICD-10-CM | POA: Diagnosis not present

## 2017-09-16 DIAGNOSIS — M3215 Tubulo-interstitial nephropathy in systemic lupus erythematosus: Secondary | ICD-10-CM | POA: Diagnosis not present

## 2017-09-16 DIAGNOSIS — D631 Anemia in chronic kidney disease: Secondary | ICD-10-CM | POA: Diagnosis not present

## 2017-09-16 DIAGNOSIS — Z9181 History of falling: Secondary | ICD-10-CM | POA: Diagnosis not present

## 2017-09-16 DIAGNOSIS — Z992 Dependence on renal dialysis: Secondary | ICD-10-CM | POA: Diagnosis not present

## 2017-09-17 DIAGNOSIS — N186 End stage renal disease: Secondary | ICD-10-CM | POA: Diagnosis not present

## 2017-09-17 DIAGNOSIS — D631 Anemia in chronic kidney disease: Secondary | ICD-10-CM | POA: Diagnosis not present

## 2017-09-17 DIAGNOSIS — N2581 Secondary hyperparathyroidism of renal origin: Secondary | ICD-10-CM | POA: Diagnosis not present

## 2017-09-17 DIAGNOSIS — D509 Iron deficiency anemia, unspecified: Secondary | ICD-10-CM | POA: Diagnosis not present

## 2017-09-18 ENCOUNTER — Emergency Department (HOSPITAL_COMMUNITY): Payer: Medicare Other

## 2017-09-18 ENCOUNTER — Other Ambulatory Visit: Payer: Self-pay

## 2017-09-18 ENCOUNTER — Emergency Department (HOSPITAL_COMMUNITY)
Admission: EM | Admit: 2017-09-18 | Discharge: 2017-09-18 | Disposition: A | Discharge status: Home / Self Care | Payer: Medicare Other | Attending: Emergency Medicine | Admitting: Emergency Medicine

## 2017-09-18 ENCOUNTER — Encounter (HOSPITAL_COMMUNITY): Payer: Self-pay | Admitting: Emergency Medicine

## 2017-09-18 DIAGNOSIS — Z8673 Personal history of transient ischemic attack (TIA), and cerebral infarction without residual deficits: Secondary | ICD-10-CM | POA: Diagnosis not present

## 2017-09-18 DIAGNOSIS — N186 End stage renal disease: Secondary | ICD-10-CM | POA: Diagnosis not present

## 2017-09-18 DIAGNOSIS — R0602 Shortness of breath: Secondary | ICD-10-CM | POA: Diagnosis not present

## 2017-09-18 DIAGNOSIS — R0781 Pleurodynia: Secondary | ICD-10-CM | POA: Diagnosis not present

## 2017-09-18 DIAGNOSIS — Z7982 Long term (current) use of aspirin: Secondary | ICD-10-CM | POA: Diagnosis not present

## 2017-09-18 DIAGNOSIS — Z992 Dependence on renal dialysis: Secondary | ICD-10-CM | POA: Diagnosis not present

## 2017-09-18 DIAGNOSIS — R519 Headache, unspecified: Secondary | ICD-10-CM

## 2017-09-18 DIAGNOSIS — M3210 Systemic lupus erythematosus, organ or system involvement unspecified: Secondary | ICD-10-CM | POA: Insufficient documentation

## 2017-09-18 DIAGNOSIS — R51 Headache: Secondary | ICD-10-CM | POA: Insufficient documentation

## 2017-09-18 DIAGNOSIS — I517 Cardiomegaly: Secondary | ICD-10-CM | POA: Diagnosis not present

## 2017-09-18 DIAGNOSIS — Z79899 Other long term (current) drug therapy: Secondary | ICD-10-CM | POA: Insufficient documentation

## 2017-09-18 LAB — BASIC METABOLIC PANEL
Anion gap: 12 (ref 5–15)
BUN: 20 mg/dL (ref 8–23)
CHLORIDE: 95 mmol/L — AB (ref 98–111)
CO2: 27 mmol/L (ref 22–32)
CREATININE: 3.76 mg/dL — AB (ref 0.44–1.00)
Calcium: 9 mg/dL (ref 8.9–10.3)
GFR calc Af Amer: 14 mL/min — ABNORMAL LOW (ref >60)
GFR calc non Af Amer: 12 mL/min — ABNORMAL LOW (ref >60)
Glucose, Bld: 99 mg/dL (ref 70–99)
Potassium: 3.8 mmol/L (ref 3.5–5.1)
SODIUM: 134 mmol/L — AB (ref 135–145)

## 2017-09-18 LAB — CBC
HCT: 37.5 % (ref 36.0–46.0)
Hemoglobin: 11.4 g/dL — ABNORMAL LOW (ref 12.0–15.0)
MCH: 27.8 pg (ref 26.0–34.0)
MCHC: 30.4 g/dL (ref 30.0–36.0)
MCV: 91.5 fL (ref 78.0–100.0)
PLATELETS: 226 10*3/uL (ref 150–400)
RBC: 4.1 MIL/uL (ref 3.87–5.11)
RDW: 16 % — AB (ref 11.5–15.5)
WBC: 4.7 10*3/uL (ref 4.0–10.5)

## 2017-09-18 LAB — I-STAT TROPONIN, ED: Troponin i, poc: 0.01 ng/mL (ref 0.00–0.08)

## 2017-09-18 MED ORDER — DEXAMETHASONE SODIUM PHOSPHATE 10 MG/ML IJ SOLN: 10.0000 mg | Freq: Once | INTRAMUSCULAR | Status: DC

## 2017-09-18 MED ORDER — LIDOCAINE 5 % EX PTCH: 1.0000 | MEDICATED_PATCH | CUTANEOUS | 0 refills | Status: DC

## 2017-09-18 MED ORDER — DIPHENHYDRAMINE HCL 50 MG/ML IJ SOLN: 12.5000 mg | Freq: Once | INTRAMUSCULAR | Status: DC

## 2017-09-18 MED ORDER — METHOCARBAMOL 500 MG PO TABS
500.0000 mg | ORAL_TABLET | Freq: Once | ORAL | Status: AC
  Administered 2017-09-18: 500 mg via ORAL
  Filled 2017-09-18: qty 1

## 2017-09-18 MED ORDER — PROCHLORPERAZINE EDISYLATE 10 MG/2ML IJ SOLN: 10.0000 mg | Freq: Once | INTRAMUSCULAR | Status: DC

## 2017-09-18 MED ORDER — METHOCARBAMOL 500 MG PO TABS: 500.0000 mg | ORAL_TABLET | Freq: Three times a day (TID) | ORAL | 0 refills | Status: DC | PRN

## 2017-09-18 NOTE — ED Triage Notes (Addendum)
Pt c/o intermittent sharp headache x 3 days. No neuro deficits noted. Dialysis pt, Tues, Thurs, Sat, c/o shortness of breath/denies chest pain at this time.

## 2017-09-18 NOTE — ED Provider Notes (Signed)
[place] EMERGENCY DEPARTMENT Provider Note   CSN: [id number] Arrival date & time: 09/18/17  1734    History   Chief Complaint Chief Complaint  Patient presents with  . Headache  . Shortness of Breath   HPI Jenna [name] is a 62 y.o. 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  Patient describes the pain as being to the right occipital area fairly focally.  She states that initially the pain would occur for a few seconds and then resolve for several hours before returning, however pain has started occurring much more frequently over the past 36 hours, she states she is now having pain every 5 to 10 seconds, remains brief in duration when occurring. She states that early last week her R lateral neck was bothering her which has improved but somewhat continued. Denies injury. Denies change in vision, numbness, weakness, paresthesias, or dizziness. Denies  recent surgery/trauma, recent long travel, hormone use, personal hx of cancer, or hx of DVT/PE.   Triage note reports patient has been having dyspnea-I clarified this with the patient she denies shortness of breath to me, she states "my sinuses are acting up and have a bit congested, I am not having trouble breathing." She denies any chest pain, but does report some L lateral rib pain after injury that PCP has ordered x-ray for, no significant change in this, no other reported areas of injury/pain related to this.    Last had full dialysis treatment yesterday.   HPI  Past Medical History:  Diagnosis Date  . CKD (chronic kidney disease) stage 5, GFR less than 15 ml/min ([name]) 02/2017   Followed by Dr. Florene Glen  . FSGS (focal segmental glomerulosclerosis) 2011   By renal biopsy  . Hx of  lupus nephritis 2011   by renal biopsy  . Lupus (systemic lupus erythematosus) ([name])    followed by Dr. Amil Amen  . Obesity   . Renal stone   . Secondary hyperparathyroidism of renal origin [facility])     Patient Active Problem List   Diagnosis Date Noted  . End stage renal disease ([place]) 06/27/2017  . CKD (chronic kidney disease) stage 5, GFR less than 15 ml/min ([name]) 02/15/2017  . Back pain at L4-L5 level 10/31/2014  . Muscle spasm of back 10/31/2014  . Breast cancer screening 09/30/2014  . Visit for screening mammogram 09/30/2014  . Chronic maxillary sinusitis 07/04/2014  . Occipital headache 07/04/2014  . Increased urinary frequency 05/23/2014  . Falls 05/23/2014  . Prediabetes 05/23/2014  . Rash and nonspecific skin eruption 05/23/2014  . Essential hypertension 04/16/2014  . Encounter for immunization 11/19/2013  . Lupus (systemic lupus erythematosus) ([place]) 09/24/2013  . CKD (chronic kidney disease) stage 4, GFR 15-29 ml/min ([name]) 07/02/2013  . TIA (transient ischemic attack) 05/05/2013  . Numbness and tingling of left side of face 05/04/2013  . Lupus ([place]) 04/15/2011  . 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  . 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    Past Surgical History:  Procedure Laterality Date  . AV FISTULA PLACEMENT Left 07/04/2017   Procedure: ARTERIOVENOUS (AV) FISTULA CREATION BRACHIOCEPHALIC;  Surgeon: [name], MD;  Location: [place];  Service: Vascular;  Laterality: Left;  . IR FLUORO GUIDE CV LINE RIGHT  06/28/2017  . IR US GUIDE VASC ACCESS RIGHT  06/28/2017     OB History   None      Home Medications    Prior to Admission medications   Medication Sig Start Date End Date Taking? Authorizing Provider  acetaminophen (TYLENOL) 500 MG tablet Take 1,000 mg by mouth every 6 (six) hours as needed for headache.    [provider]  aspirin 81 MG tablet Take 1 tablet (81 mg total) by mouth daily. Patient taking differently: Take 81 mg by  mouth 2 (two) times a week.  12/25/13   Tresa Garter, MD  HYDROcodone-acetaminophen (NORCO) 7.5-325 MG tablet Take 1 tablet by mouth every 6 (six) hours as needed for moderate pain or severe pain. 07/05/17   Bonnielee Haff, MD  hydroxychloroquine (PLAQUENIL) 200 MG tablet Take 1 tablet (200 mg total) by mouth 2 (two) times daily. 12/25/13   Tresa Garter, MD  multivitamin (RENA-VIT) TABS tablet Take 1 tablet by mouth at bedtime. Patient not taking: Reported on 08/15/2017 07/05/17   Bonnielee Haff, MD    Family History Family History  Problem Relation Age of Onset  . Diabetes Mother   . Hypertension Father   . Cancer Sister   . Hypertension Brother     Social History Social History   Tobacco Use  . Smoking status: Never Smoker  . Smokeless tobacco: Never Used  Substance Use Topics  . Alcohol use: No  . Drug use: No     Allergies   Enalapril maleate; Tape; Chocolate; and Penicillins   Review of Systems Review of Systems  Constitutional: Negative for chills and fever.  HENT: Positive for congestion. Negative for ear pain.   Eyes: Negative for visual disturbance.  Respiratory: Negative for shortness of breath.   Cardiovascular: Negative for chest pain.  Gastrointestinal: Negative for abdominal pain, nausea and vomiting.  Musculoskeletal: Positive for neck pain.       Positive for L lateral rib pain  Neurological: Positive for headaches. Negative for dizziness, seizures, syncope, facial asymmetry, speech difficulty, weakness and numbness.  All other systems reviewed and are negative.  Physical Exam Updated Vital Signs BP 120/77 (BP Location: Right Arm)   Pulse 75   Temp 98.7 F (37.1 C) (Oral)   Resp 18   SpO2 95%   Physical Exam  Constitutional: She appears well-developed and well-nourished.  Non-toxic appearance. No distress.  HENT:  Head: Normocephalic and atraumatic.  Right Ear: Tympanic membrane normal. Tympanic membrane is not perforated, not  erythematous, not retracted and not bulging.  Left Ear: Tympanic membrane normal. Tympanic membrane is not perforated, not erythematous, not retracted and not bulging.  Nose: Mucosal edema present. Right sinus exhibits no maxillary sinus tenderness and no frontal sinus tenderness. Left sinus exhibits no maxillary sinus tenderness and no frontal sinus tenderness.  Mouth/Throat: Uvula is midline and oropharynx is clear and moist.  Eyes: Pupils are equal, round, and reactive to light. Conjunctivae and EOM are normal. Right eye exhibits no discharge. Left eye exhibits no discharge.  Neck: Normal carotid pulses present. Muscular tenderness (R trapezius with palpable muscle spasm) present. No spinous process tenderness present. Carotid bruit is not present. No neck rigidity. No edema, no erythema and normal range of motion present.  Cardiovascular: Normal rate and regular rhythm.  No murmur heard. Pulses:      Radial pulses are 2+ on the right side, and 2+ on the left side.  Palpable AV fistula in LUE.   Pulmonary/Chest: Effort normal and breath sounds normal. No respiratory distress. She has  no wheezes. She has no rhonchi. She has no rales. She exhibits tenderness (left lateral chest wall). She exhibits no mass, no laceration, no crepitus, no edema, no deformity, no swelling and no retraction.  Chest wall catheter in place, no surrounding erythema/drainage.   Abdominal: Soft. She exhibits no distension. There is no tenderness.  Musculoskeletal:       Right lower leg: She exhibits no tenderness and no edema.       Left lower leg: She exhibits no tenderness and no edema.  Neurological: She is alert.  Alert. Clear speech. No facial droop. CNIII-XII grossly intact. Bilateral upper and lower extremities' sensation grossly intact. 5/5 symmetric strength with grip strength and with plantar and dorsi flexion bilaterally. Patellar DTRs are 2+ and symmetric . Normal finger to nose bilaterally. Negative pronator  drift. Negative Romberg sign. Gait is steady and intact.    Skin: Skin is warm and dry. No rash noted.  Psychiatric: She has a normal mood and affect. Her behavior is normal.  Nursing note and vitals reviewed.    ED Treatments / Results  Labs Results for orders placed or performed during the hospital encounter of 09/18/17  Basic metabolic panel  Result Value Ref Range   Sodium 134 (L) 135 - 145 mmol/L   Potassium 3.8 3.5 - 5.1 mmol/L   Chloride 95 (L) 98 - 111 mmol/L   CO2 27 22 - 32 mmol/L   Glucose, Bld 99 70 - 99 mg/dL   BUN 20 8 - 23 mg/dL   Creatinine, Ser 2.49 (H) 0.44 - 1.00 mg/dL   Calcium 9.0 8.9 - 32.4 mg/dL   GFR calc non Af Amer 12 (L) >60 mL/min   GFR calc Af Amer 14 (L) >60 mL/min   Anion gap 12 5 - 15  CBC  Result Value Ref Range   WBC 4.7 4.0 - 10.5 K/uL   RBC 4.10 3.87 - 5.11 MIL/uL   Hemoglobin 11.4 (L) 12.0 - 15.0 g/dL   HCT 19.9 14.4 - 45.8 %   MCV 91.5 78.0 - 100.0 fL   MCH 27.8 26.0 - 34.0 pg   MCHC 30.4 30.0 - 36.0 g/dL   RDW 48.3 (H) 50.7 - 57.3 %   Platelets 226 150 - 400 K/uL  I-stat troponin, ED  Result Value Ref Range   Troponin i, poc 0.01 0.00 - 0.08 ng/mL   Comment 3           EKG EKG Interpretation  Date/Time:  Sunday September 18 2017 20:50:05 EDT Ventricular Rate:  85 PR Interval:    QRS Duration: 131 QT Interval:  383 QTC Calculation: 456 R Axis:   -56 Text Interpretation:  Sinus rhythm Atrial premature complex Short PR interval Artifact in lead(s) I II aVR aVL and baseline wander in lead(s) V3 V5 TECHNICALLY DIFFICULT flipped t waves laterally resolved Otherwise no significant change Confirmed by Melene Plan [phone]) on 09/18/2017 8:59:58 PM   Radiology Dg Chest 2 View  Result Date: 09/18/2017 CLINICAL DATA:  Intermittent headache. History of end-stage renal disease on dialysis. EXAM: CHEST - 2 VIEW COMPARISON:  Chest radiograph Jun 27, 2017 FINDINGS: Cardiac silhouette is mildly enlarged unchanged. Mediastinal silhouette is not  suspicious. No pleural effusion or focal consolidation. No pneumothorax. Pulmonary vasculature is normal. Mildly elevated RIGHT hemidiaphragm. Tunneled dialysis catheter via RIGHT internal jugular venous approach with distal tip projecting mid superior vena cava. No pneumothorax. IMPRESSION: Similar cardiomegaly.  No acute pulmonary process. Electronically Signed   By: Pernell Dupre  Bloomer M.D.   On: 09/18/2017 19:30    Procedures Procedures (including critical care time)  Medications Ordered in ED Medications  methocarbamol (ROBAXIN) tablet 500 mg (500 mg Oral Given 09/18/17 2109)    Initial Impression / Assessment and Plan / ED Course  I have reviewed the triage vital signs and the nursing notes.  Pertinent labs & imaging results that were available during my care of the patient were reviewed by me and considered in my medical decision making (see chart for details).   Patient presents to the emergency department complaint of headache which is been intermittent for the past 4 days, increase in frequency of the past 36 hours.  Patient nontoxic appearing, no apparent distress, initial vitals WNL.  Regarding patient's headache: No focal neuro deficits, not persistent, non concerning for [facility], ICH, ischemic CVA, dural venous sinus thrombosis, acute glaucoma, giant cell arteritis, mass, vertebral artery dissection, or meningitis. Appears consistent with trapezius muscle spasm- will trial robaxin and lidoderm patches  Regarding dyspnea in triage note-patient denies dyspnea to me, she states she is just having some congestion.  Dyspnea work-up protocol utilized per triage reviewed- grossly unremarkable, x-ray with cardiomegaly similar to previous, mild hyponatremia/hypochloremia, creatinine elevated at 3.76, patient is a hemodialysis patient creatinine ranging 1.8-5.7 over past 2 months. Troponin negative. EKG without obvious ischemia, no significant concerning changes since last tracing. Do not feel  further evaluation is necessary at this time.   I discussed results, treatment plan, need for PCP follow-up, and return precautions with the patient. Provided opportunity for questions, patient confirmed understanding and is in agreement with plan.   Findings and plan of care discussed with supervising physician Dr. Tyrone Nine who personally evaluated and examined this patient and is in agreement with plan.    Final Clinical Impressions(s) / ED Diagnoses   Final diagnoses:  Acute nonintractable headache, unspecified headache type    ED Discharge Orders        Ordered    methocarbamol (ROBAXIN) 500 MG tablet  Every 8 hours PRN     09/18/17 2030    lidocaine (LIDODERM) 5 %  Every 24 hours     09/18/17 2030       Chablis Losh, Glynda Jaeger, PA-C 09/19/17 [place], [place], DO 09/20/17 1913

## 2017-09-18 NOTE — ED Notes (Signed)
Patient denies SOB, states "I never had SOB just this sharpe pain in the back of my head."

## 2017-09-18 NOTE — Discharge Instructions (Addendum)
You are seen in the emergency department today for head pain.  We suspect at this time that this is related to a muscle spasm.  We are sending you home with multiple medicines including Robaxin and Lidoderm patches.   Robaxin is the muscle relaxer I have prescribed, this is meant to help with muscle tightness.  Take this every 8 hours as needed for pain.  Be aware that this medication may make you drowsy therefore the first time you take this it should be at a time you are in an environment where you can rest. Do not drive or operate heavy machinery when taking this medication.  Do not drink alcohol when taking this medicine.  Be the Lidoderm patches are patches you can place directly over the area of pain that you are having.  Additionally apply heat to this area.  We have prescribed you new medication(s) today. Discuss the medications prescribed today with your pharmacist as they can have adverse effects and interactions with your other medicines including over the counter and prescribed medications. Seek medical evaluation if you start to experience new or abnormal symptoms after taking one of these medicines, seek care immediately if you start to experience difficulty breathing, feeling of your throat closing, facial swelling, or rash as these could be indications of a more serious allergic reaction  Please follow-up with your primary care provider within 3 days.  Return to the ER for new or worsening symptoms including but not limited to change in vision, numbness, weakness, dizziness, passing out, or any other concerns that you may have.

## 2017-09-18 NOTE — ED Notes (Signed)
Patient verbalizes understanding of medications and discharge instructions. No further questions at this time. VSS and patient ambulatory at discharge.   

## 2017-09-20 DIAGNOSIS — D631 Anemia in chronic kidney disease: Secondary | ICD-10-CM | POA: Diagnosis not present

## 2017-09-20 DIAGNOSIS — D509 Iron deficiency anemia, unspecified: Secondary | ICD-10-CM | POA: Diagnosis not present

## 2017-09-20 DIAGNOSIS — N186 End stage renal disease: Secondary | ICD-10-CM | POA: Diagnosis not present

## 2017-09-20 DIAGNOSIS — N2581 Secondary hyperparathyroidism of renal origin: Secondary | ICD-10-CM | POA: Diagnosis not present

## 2017-09-22 DIAGNOSIS — D509 Iron deficiency anemia, unspecified: Secondary | ICD-10-CM | POA: Diagnosis not present

## 2017-09-22 DIAGNOSIS — N2581 Secondary hyperparathyroidism of renal origin: Secondary | ICD-10-CM | POA: Diagnosis not present

## 2017-09-22 DIAGNOSIS — D631 Anemia in chronic kidney disease: Secondary | ICD-10-CM | POA: Diagnosis not present

## 2017-09-22 DIAGNOSIS — N186 End stage renal disease: Secondary | ICD-10-CM | POA: Diagnosis not present

## 2017-09-23 ENCOUNTER — Encounter: Payer: Self-pay | Admitting: Family Medicine

## 2017-09-23 ENCOUNTER — Ambulatory Visit (INDEPENDENT_AMBULATORY_CARE_PROVIDER_SITE_OTHER): Payer: Medicare Other | Admitting: Family Medicine

## 2017-09-23 VITALS — BP 126/84 | HR 87 | Temp 98.7°F | Resp 16 | Ht 67.0 in | Wt 198.0 lb

## 2017-09-23 DIAGNOSIS — Z23 Encounter for immunization: Secondary | ICD-10-CM | POA: Diagnosis not present

## 2017-09-23 DIAGNOSIS — N186 End stage renal disease: Secondary | ICD-10-CM | POA: Diagnosis not present

## 2017-09-23 DIAGNOSIS — M542 Cervicalgia: Secondary | ICD-10-CM | POA: Diagnosis not present

## 2017-09-23 DIAGNOSIS — Z992 Dependence on renal dialysis: Secondary | ICD-10-CM | POA: Diagnosis not present

## 2017-09-23 DIAGNOSIS — I10 Essential (primary) hypertension: Secondary | ICD-10-CM | POA: Diagnosis not present

## 2017-09-23 DIAGNOSIS — Z1231 Encounter for screening mammogram for malignant neoplasm of breast: Secondary | ICD-10-CM

## 2017-09-23 DIAGNOSIS — Z0000 Encounter for general adult medical examination without abnormal findings: Secondary | ICD-10-CM

## 2017-09-23 DIAGNOSIS — Z1239 Encounter for other screening for malignant neoplasm of breast: Secondary | ICD-10-CM

## 2017-09-23 MED ORDER — DICLOFENAC SODIUM 1 % TD GEL
4.0000 g | Freq: Four times a day (QID) | TRANSDERMAL | 0 refills | Status: DC
Start: 2017-09-23 — End: 2018-12-13

## 2017-09-23 NOTE — Patient Instructions (Addendum)
A few things to remember from today's visit:   Breast cancer screening - Plan: MM 3D SCREEN BREAST BILATERAL  Essential hypertension  ESRD on dialysis [facility])  Cervicalgia   Please be sure medication list is accurate. If a new problem present, please set up appointment sooner than planned today.

## 2017-09-23 NOTE — Assessment & Plan Note (Signed)
Adequately controlled. No changes in current management. Low salt diet recommended. Eye exam is current. F/U in 4 months, before if needed.

## 2017-09-23 NOTE — Progress Notes (Signed)
HPI:   Jenna [name] is a 62 y.o. female, who is here today with her daughter to establish care.  Former PCP: Dr Volanda Napoleon Last preventive routine visit: A few years ago.   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   She was hospitalized in 06/2017, she was sent from her nephrologist's office because worsening edema and exertional dyspnea and to prepare her for dialysis. She started renal transplant evaluation,first visit yesterday.   Left brachiocephalic fistula,still healing and not ready to use. She has right IJ dialysis cath that is being used for dialysis, T-T-S  She is also c/o 2-3 weeks of left cervical sharp/sore pain radiated to occipital scalp and now parietal. Sometimes it can be 10/10. + Upper back pain. It seems to be exacerbated by stress, alleviated by deeps breathing.  It has improved.  She has taken Tylenol.   HTN:  She is taking Aspirin 81 mg daily. When asked about Hx of CAD or CVA,she does not report any. Reviewing records she has Hx of TIA.  Denies visual changes, chest pain, dyspnea, palpitation, claudication, focal weakness, or edema.   Review of Systems  Constitutional: Negative for activity change, appetite change, fatigue, fever and unexpected weight change.  HENT: Negative for mouth sores, nosebleeds and trouble swallowing.   Eyes: Negative for redness and visual disturbance.  Respiratory: Negative for cough, shortness of breath and wheezing.   Cardiovascular: Negative for chest pain, palpitations and leg swelling.  Gastrointestinal: Negative for abdominal pain, nausea and vomiting.       Negative for changes in bowel habits.  Genitourinary: Negative for dysuria.  Musculoskeletal: Positive for back pain and gait problem.  Skin: Negative for rash and wound.  Neurological: Negative for syncope, weakness and headaches.  Psychiatric/Behavioral:  Negative for confusion. The patient is nervous/anxious.       Current Outpatient Medications on File Prior to Visit  Medication Sig Dispense Refill  . acetaminophen (TYLENOL) 500 MG tablet Take 1,000 mg by mouth every 6 (six) hours as needed for headache.    Marland Kitchen aspirin 81 MG tablet Take 1 tablet (81 mg total) by mouth daily. (Patient taking differently: Take 81 mg by mouth 2 (two) times a week. ) 90 tablet 3  . HYDROcodone-acetaminophen (NORCO) 7.5-325 MG tablet Take 1 tablet by mouth every 6 (six) hours as needed for moderate pain or severe pain. 15 tablet 0  . hydroxychloroquine (PLAQUENIL) 200 MG tablet Take 1 tablet (200 mg total) by mouth 2 (two) times daily. 180 tablet 3  . ethyl chloride spray   12  . lidocaine (LIDODERM) 5 % Place 1 patch onto the skin daily. Remove & Discard patch within 12 hours or as directed by MD (Patient not taking: Reported on 09/23/2017) 30 patch 0  . methocarbamol (ROBAXIN) 500 MG tablet Take 1 tablet (500 mg total) by mouth every 8 (eight) hours as needed. (Patient not taking: Reported on 09/23/2017) 30 tablet 0   No current facility-administered medications on file prior to visit.      Past Medical History:  Diagnosis Date  . CKD (chronic kidney disease) stage 5, GFR less than 15 ml/min ([name]) 02/2017   Followed by Dr. Florene Glen  . FSGS (focal segmental glomerulosclerosis) 2011   By renal biopsy  . Hx of lupus nephritis 2011   by renal biopsy  . Lupus (systemic lupus erythematosus) ([place])    followed by Dr.  Beekman  . Obesity   . Renal stone   . Secondary hyperparathyroidism of renal origin ([place])    Allergies  Allergen Reactions  . Enalapril Maleate Anaphylaxis and Other (See Comments)    Throat swells  . Penicillin G   . Tape Itching and Other (See Comments)    Irritates the skin  . Chocolate Nausea And Vomiting  . Penicillins Other (See Comments)    Made patient lightheaded Has patient had a PCN reaction causing immediate rash,  facial/tongue/throat swelling, SOB or lightheadedness with hypotension: Yes Has patient had a PCN reaction causing severe rash involving mucus membranes or skin necrosis: No Has patient had a PCN reaction that required hospitalization: No Has patient had a PCN reaction occurring within the last 10 years: No If all of the above answers are "NO", then may proceed with Cephalosporin use.     Family History  Problem Relation Age of Onset  . Diabetes Mother   . Hypertension Father   . Cancer Sister   . Hypertension Brother     Social History   Socioeconomic History  . Marital status: Legally Separated    Spouse name: Not on file  . Number of children: Not on file  . Years of education: Not on file  . Highest education level: Not on file  Occupational History  . Not on file  Social Needs  . Financial resource strain: Not on file  . Food insecurity:    Worry: Not on file    Inability: Not on file  . Transportation needs:    Medical: Not on file    Non-medical: Not on file  Tobacco Use  . Smoking status: Never Smoker  . Smokeless tobacco: Never Used  Substance and Sexual Activity  . Alcohol use: No  . Drug use: No  . Sexual activity: Not on file  Lifestyle  . Physical activity:    Days per week: Not on file    Minutes per session: Not on file  . Stress: Not on file  Relationships  . Social connections:    Talks on phone: Not on file    Gets together: Not on file    Attends religious service: Not on file    Active member of club or organization: Not on file    Attends meetings of clubs or organizations: Not on file    Relationship status: Not on file  Other Topics Concern  . Not on file  Social History Narrative  . Not on file    Vitals:   09/23/17 1024  BP: 126/84  Pulse: 87  Resp: 16  Temp: 98.7 F (37.1 C)  SpO2: 100%    Body mass index is 31.01 kg/m.    Physical Exam  Nursing note and vitals reviewed. Constitutional: She is oriented to person,  place, and time. She appears well-developed. No distress.  HENT:  Head: Normocephalic and atraumatic.  Mouth/Throat: Oropharynx is clear and moist and mucous membranes are normal.  Eyes: Pupils are equal, round, and reactive to light. Conjunctivae are normal.  Cardiovascular: Normal rate and regular rhythm.  No murmur heard. DP pulses present.  Respiratory: Effort normal and breath sounds normal. No respiratory distress.  GI: Soft. She exhibits no mass. There is no hepatomegaly. There is no tenderness.  Musculoskeletal: She exhibits no edema.       Cervical back: She exhibits tenderness. She exhibits normal range of motion and no bony tenderness.       Back:  Lymphadenopathy:  She has no cervical adenopathy.  Neurological: She is alert and oriented to person, place, and time. She has normal strength. No cranial nerve deficit. Gait abnormal.  Gait assisted with a cane.  Skin: Skin is warm. No rash noted. No erythema.  Psychiatric: Her mood appears anxious.  Well groomed, good eye contact.      ASSESSMENT AND PLAN:  Ms. Mauria was seen today for transfer of care.  Diagnoses and all orders for this visit:  Cervicalgia  Local massage and [place] hot with lidocaine may help. Will add neck PT to [facility] PT. Topical Diclofenac may also help.  -     diclofenac sodium (VOLTAREN) 1 % GEL; Apply 4 g topically 4 (four) times daily.  Essential hypertension  Adequately controlled. No changes in current management. DASH/low salt diet recommended. Eye exam recommended annually. F/U in 4 months, before if needed.  Breast cancer screening -     MM 3D SCREEN BREAST BILATERAL; Future  ESRD on dialysis [facility])  Continue following with nephrologist.  Need for Tdap vaccination -     Tdap vaccine greater than or equal to 7yo IM  Need for 23-polyvalent pneumococcal polysaccharide vaccine -     Pneumococcal polysaccharide vaccine 23-valent greater than or equal to 2yo  subcutaneous/IM  Healthcare maintenance  Vaccination updated. She also needs zoster vaccine,she wants to ask rheuma first. Pap smear to be arranged. She wants to hold on colon cancer screening, she is going to need it eventually as part of renal transplant process.      Jayon Matton G. [name], MD  [facility] [place] office.

## 2017-09-23 NOTE — Assessment & Plan Note (Signed)
She had her fist evaluation with renal transplant team. Dialysis 3 times per week and following with nephrologists. She received Pneumovax vaccine today.

## 2017-09-24 DIAGNOSIS — D509 Iron deficiency anemia, unspecified: Secondary | ICD-10-CM | POA: Diagnosis not present

## 2017-09-24 DIAGNOSIS — N2581 Secondary hyperparathyroidism of renal origin: Secondary | ICD-10-CM | POA: Diagnosis not present

## 2017-09-24 DIAGNOSIS — N186 End stage renal disease: Secondary | ICD-10-CM | POA: Diagnosis not present

## 2017-09-24 DIAGNOSIS — D631 Anemia in chronic kidney disease: Secondary | ICD-10-CM | POA: Diagnosis not present

## 2017-09-27 DIAGNOSIS — N186 End stage renal disease: Secondary | ICD-10-CM | POA: Diagnosis not present

## 2017-09-29 DIAGNOSIS — N186 End stage renal disease: Secondary | ICD-10-CM | POA: Diagnosis not present

## 2017-09-29 DIAGNOSIS — N2581 Secondary hyperparathyroidism of renal origin: Secondary | ICD-10-CM | POA: Diagnosis not present

## 2017-09-29 DIAGNOSIS — D631 Anemia in chronic kidney disease: Secondary | ICD-10-CM | POA: Diagnosis not present

## 2017-09-29 DIAGNOSIS — D509 Iron deficiency anemia, unspecified: Secondary | ICD-10-CM | POA: Diagnosis not present

## 2017-10-01 DIAGNOSIS — N2581 Secondary hyperparathyroidism of renal origin: Secondary | ICD-10-CM | POA: Diagnosis not present

## 2017-10-01 DIAGNOSIS — N186 End stage renal disease: Secondary | ICD-10-CM | POA: Diagnosis not present

## 2017-10-01 DIAGNOSIS — D509 Iron deficiency anemia, unspecified: Secondary | ICD-10-CM | POA: Diagnosis not present

## 2017-10-01 DIAGNOSIS — D631 Anemia in chronic kidney disease: Secondary | ICD-10-CM | POA: Diagnosis not present

## 2017-10-04 DIAGNOSIS — D631 Anemia in chronic kidney disease: Secondary | ICD-10-CM | POA: Diagnosis not present

## 2017-10-04 DIAGNOSIS — N2581 Secondary hyperparathyroidism of renal origin: Secondary | ICD-10-CM | POA: Diagnosis not present

## 2017-10-04 DIAGNOSIS — D509 Iron deficiency anemia, unspecified: Secondary | ICD-10-CM | POA: Diagnosis not present

## 2017-10-04 DIAGNOSIS — N186 End stage renal disease: Secondary | ICD-10-CM | POA: Diagnosis not present

## 2017-10-06 DIAGNOSIS — N186 End stage renal disease: Secondary | ICD-10-CM | POA: Diagnosis not present

## 2017-10-06 DIAGNOSIS — N2581 Secondary hyperparathyroidism of renal origin: Secondary | ICD-10-CM | POA: Diagnosis not present

## 2017-10-06 DIAGNOSIS — D631 Anemia in chronic kidney disease: Secondary | ICD-10-CM | POA: Diagnosis not present

## 2017-10-06 DIAGNOSIS — D509 Iron deficiency anemia, unspecified: Secondary | ICD-10-CM | POA: Diagnosis not present

## 2017-10-08 DIAGNOSIS — D631 Anemia in chronic kidney disease: Secondary | ICD-10-CM | POA: Diagnosis not present

## 2017-10-08 DIAGNOSIS — N2581 Secondary hyperparathyroidism of renal origin: Secondary | ICD-10-CM | POA: Diagnosis not present

## 2017-10-08 DIAGNOSIS — D509 Iron deficiency anemia, unspecified: Secondary | ICD-10-CM | POA: Diagnosis not present

## 2017-10-08 DIAGNOSIS — N186 End stage renal disease: Secondary | ICD-10-CM | POA: Diagnosis not present

## 2017-10-10 ENCOUNTER — Encounter: Payer: Self-pay | Admitting: Family Medicine

## 2017-10-10 ENCOUNTER — Ambulatory Visit (INDEPENDENT_AMBULATORY_CARE_PROVIDER_SITE_OTHER): Payer: Medicare Other | Admitting: Family Medicine

## 2017-10-10 VITALS — BP 118/72 | HR 88 | Temp 98.6°F | Resp 16 | Ht 67.0 in | Wt 201.0 lb

## 2017-10-10 DIAGNOSIS — R0781 Pleurodynia: Secondary | ICD-10-CM

## 2017-10-10 DIAGNOSIS — J309 Allergic rhinitis, unspecified: Secondary | ICD-10-CM | POA: Diagnosis not present

## 2017-10-10 DIAGNOSIS — T148XXA Other injury of unspecified body region, initial encounter: Secondary | ICD-10-CM

## 2017-10-10 DIAGNOSIS — R070 Pain in throat: Secondary | ICD-10-CM

## 2017-10-10 MED ORDER — FLUTICASONE PROPIONATE 50 MCG/ACT NA SUSP
1.0000 | Freq: Two times a day (BID) | NASAL | 3 refills | Status: DC
Start: 2017-10-10 — End: 2018-12-13

## 2017-10-10 NOTE — Progress Notes (Signed)
ACUTE VISIT  HPI:  Chief Complaint  Patient presents with  . Sore Throat    [name] is a 62 y.o.female here today with her daughter complaining of at least a month of scratchy throat on left side.   Problem has been stable. Alleviated by coughing. No exacerbating factors.  No associated odynophagia, dysphagia, stridor, cough, wheezing, or dyspnea. She has history of "sinuses", she has had intermittent sinus pressure and postnasal drainage. She has taken OTC Mucinex, which was helping.  She ran out of medication a few days ago.  She has also tried throat lozenges, did not help.  Denies swallowing issues/irritation while eating chicken or fish. No history of trauma.  No associated nausea, vomiting, epigastric pain.  Occasional heartburn, exacerbated by certain foods and mainly at night.  -She is also complaining of tender and ecchymotic area on left upper extremity that she developed after venipuncture, trying to take blood few days ago. She has not noted other ecchymotic areas, nosebleed, gum bleed, gross hematuria, or blood in the stool.   -Left thoracic pain, intermittently, aggravated by movement, palpation, and when coughing (trying to relieve throat discomfort). She denies any trauma. Negative for cough, wheezing, skin changes, or dyspnea. Pain is "bad."  She has not noted local erythema, erythema, or rash. No history of direct trauma.   Sore Throat   This is a new problem. The current episode started 1 to 4 weeks ago. The problem has been unchanged. Neither side of throat is experiencing more pain than the other. There has been no fever. The pain is at a severity of 0/10. The pain is mild. Associated symptoms include coughing. Pertinent negatives include no abdominal pain, congestion, diarrhea, ear pain, hoarse voice, plugged ear sensation, shortness of breath, stridor, swollen glands, trouble swallowing or vomiting. She has had no exposure to  strep or mono. Treatments tried: coughing and Mucinex. The treatment provided mild relief.    No Hx of recent travel. No sick contact. No known insect bite.  Hx of allergies: Yes.    Review of Systems  Constitutional: Positive for fatigue. Negative for activity change, appetite change and fever.  HENT: Positive for postnasal drip and sinus pressure. Negative for congestion, ear pain, hoarse voice, mouth sores, sore throat, trouble swallowing and voice change.   Respiratory: Positive for cough. Negative for shortness of breath, wheezing and stridor.   Cardiovascular: Negative for chest pain.  Gastrointestinal: Negative for abdominal pain, diarrhea, nausea and vomiting.  Musculoskeletal: Positive for myalgias. Negative for joint swelling.  Skin: Negative for rash and wound.  Allergic/Immunologic: Positive for environmental allergies.  Hematological: Negative for adenopathy. Does not bruise/bleed easily.  Psychiatric/Behavioral: Negative for confusion. The patient is nervous/anxious.       Current Outpatient Medications on File Prior to Visit  Medication Sig Dispense Refill  . acetaminophen (TYLENOL) 500 MG tablet Take 1,000 mg by mouth every 6 (six) hours as needed for headache.    Marland Kitchen aspirin 81 MG tablet Take 1 tablet (81 mg total) by mouth daily. (Patient taking differently: Take 81 mg by mouth 2 (two) times a week. ) 90 tablet 3  . diclofenac sodium (VOLTAREN) 1 % GEL Apply 4 g topically 4 (four) times daily. 4 Tube 0  . ethyl chloride spray   12  . HYDROcodone-acetaminophen (NORCO) 7.5-325 MG tablet Take 1 tablet by mouth every 6 (six) hours as needed for moderate pain or severe pain. 15 tablet 0  . hydroxychloroquine (  PLAQUENIL) 200 MG tablet Take 1 tablet (200 mg total) by mouth 2 (two) times daily. 180 tablet 3  . lidocaine (LIDODERM) 5 % Place 1 patch onto the skin daily. Remove & Discard patch within 12 hours or as directed by MD 30 patch 0  . methocarbamol (ROBAXIN) 500 MG  tablet Take 1 tablet (500 mg total) by mouth every 8 (eight) hours as needed. 30 tablet 0   No current facility-administered medications on file prior to visit.      Past Medical History:  Diagnosis Date  . CKD (chronic kidney disease) stage 5, GFR less than 15 ml/min ([name]) 02/2017   Followed by Dr. Florene Glen  . FSGS (focal segmental glomerulosclerosis) 2011   By renal biopsy  . Hx of lupus nephritis 2011   by renal biopsy  . Lupus (systemic lupus erythematosus) ([name])    followed by Dr. Amil Amen  . Obesity   . Renal stone   . Secondary hyperparathyroidism of renal origin ([place])    Allergies  Allergen Reactions  . Enalapril Maleate Anaphylaxis and Other (See Comments)    Throat swells  . Penicillin G   . Tape Itching and Other (See Comments)    Irritates the skin  . Chocolate Nausea And Vomiting  . Penicillins Other (See Comments)    Made patient lightheaded Has patient had a PCN reaction causing immediate rash, facial/tongue/throat swelling, SOB or lightheadedness with hypotension: Yes Has patient had a PCN reaction causing severe rash involving mucus membranes or skin necrosis: No Has patient had a PCN reaction that required hospitalization: No Has patient had a PCN reaction occurring within the last 10 years: No If all of the above answers are "NO", then may proceed with Cephalosporin use.     Social History   Socioeconomic History  . Marital status: Legally Separated    Spouse name: Not on file  . Number of children: Not on file  . Years of education: Not on file  . Highest education level: Not on file  Occupational History  . Not on file  Social Needs  . Financial resource strain: Not on file  . Food insecurity:    Worry: Not on file    Inability: Not on file  . Transportation needs:    Medical: Not on file    Non-medical: Not on file  Tobacco Use  . Smoking status: Never Smoker  . Smokeless tobacco: Never Used  Substance and Sexual Activity  . Alcohol use:  No  . Drug use: No  . Sexual activity: Not on file  Lifestyle  . Physical activity:    Days per week: Not on file    Minutes per session: Not on file  . Stress: Not on file  Relationships  . Social connections:    Talks on phone: Not on file    Gets together: Not on file    Attends religious service: Not on file    Active member of club or organization: Not on file    Attends meetings of clubs or organizations: Not on file    Relationship status: Not on file  Other Topics Concern  . Not on file  Social History Narrative  . Not on file    Vitals:   10/10/17 1003  BP: 118/72  Pulse: 88  Resp: 16  Temp: 98.6 F (37 C)  SpO2: 98%   Body mass index is 31.48 kg/m.   Physical Exam  Nursing note and vitals reviewed. Constitutional: She is oriented to  person, place, and time. She appears well-developed. No distress.  HENT:  Head: Normocephalic and atraumatic.  Nose: Right sinus exhibits no maxillary sinus tenderness and no frontal sinus tenderness. Left sinus exhibits no maxillary sinus tenderness and no frontal sinus tenderness.  Mouth/Throat: Uvula is midline, oropharynx is clear and moist and mucous membranes are normal.  Hypertrophic turbinates.  Eyes: Pupils are equal, round, and reactive to light. Conjunctivae are normal.  Neck: Neck supple. Muscular tenderness (Clavicular insertion of sternocleidomastoid muscle, bilateral.  No deformity appreciated.) present. No tracheal deviation, no edema and no erythema present. No thyroid mass and no thyromegaly present.  Cardiovascular: Normal rate and regular rhythm.  No murmur heard. Respiratory: Effort normal and breath sounds normal. No respiratory distress. She exhibits tenderness.  GI: Soft. She exhibits no mass. There is no tenderness.  Musculoskeletal: She exhibits tenderness. She exhibits no edema.  Tenderness upon palpation of left-sided trunk, above lateral rib cage.  No deformity, edema, erythema appreciated.  Pain  elicited by deep breathing.  Lymphadenopathy:       Head (right side): No submandibular adenopathy present.       Head (left side): No submandibular adenopathy present.    She has no cervical adenopathy.  Neurological: She is alert and oriented to person, place, and time. She has normal strength. No cranial nerve deficit. Gait normal.  Skin: Skin is warm. Ecchymosis noted. No rash noted. No erythema.     Tender hematoma lateral aspect of left arm above elbow (2 cm x 4 cm) and small ecchymosis underneath 2.5 cm).  Psychiatric: Her mood appears anxious.  Well groomed, good eye contact.     ASSESSMENT AND PLAN:   Ms. Lareta was seen today for sore throat.  Diagnoses and all orders for this visit:  Throat discomfort  Oropharyngeal examination today negative. We discussed possible etiologies: Allergies, GERD among some. Because it seems to get better when she was taking Mucinex, this could be related to postnasal drainage. If problem persist or if it gets worse we could consider ENT evaluation.  Allergic rhinitis, unspecified seasonality, unspecified trigger  Recommend intranasal steroid daily as needed. For now I am not recommending oral antihistaminic. Follow-up as needed.  -     fluticasone (FLONASE) 50 MCG/ACT nasal spray; Place 1 spray into both nostrils 2 (two) times daily.  Costal margin pain  Most likely musculoskeletal and related to cough. Recommend avoiding cough, which she provokes to relieve throat discomfort. Avoid shallow breathing. I do not think imaging is needed today. Instructed about warning signs. Follow-up as needed.  Hematoma  Continue local ice. Explained that it may take a few weeks for lesion to resolve. Follow-up as needed.      Jozee Hammer G. [name], MD  [facility]. [place] office.

## 2017-10-10 NOTE — Patient Instructions (Addendum)
A few things to remember from today's visit:   Throat discomfort  Allergic rhinitis, unspecified seasonality, unspecified trigger - Plan: fluticasone (FLONASE) 50 MCG/ACT nasal spray  Costal margin pain  Hematoma Local ice. Deep breathing a few times per day.   Please be sure medication list is accurate. If a new problem present, please set up appointment sooner than planned today.

## 2017-10-11 DIAGNOSIS — D509 Iron deficiency anemia, unspecified: Secondary | ICD-10-CM | POA: Diagnosis not present

## 2017-10-11 DIAGNOSIS — N186 End stage renal disease: Secondary | ICD-10-CM | POA: Diagnosis not present

## 2017-10-11 DIAGNOSIS — N2581 Secondary hyperparathyroidism of renal origin: Secondary | ICD-10-CM | POA: Diagnosis not present

## 2017-10-11 DIAGNOSIS — D631 Anemia in chronic kidney disease: Secondary | ICD-10-CM | POA: Diagnosis not present

## 2017-10-12 DIAGNOSIS — Z79899 Other long term (current) drug therapy: Secondary | ICD-10-CM | POA: Diagnosis not present

## 2017-10-13 DIAGNOSIS — N2581 Secondary hyperparathyroidism of renal origin: Secondary | ICD-10-CM | POA: Diagnosis not present

## 2017-10-13 DIAGNOSIS — D631 Anemia in chronic kidney disease: Secondary | ICD-10-CM | POA: Diagnosis not present

## 2017-10-13 DIAGNOSIS — D509 Iron deficiency anemia, unspecified: Secondary | ICD-10-CM | POA: Diagnosis not present

## 2017-10-13 DIAGNOSIS — N186 End stage renal disease: Secondary | ICD-10-CM | POA: Diagnosis not present

## 2017-10-15 DIAGNOSIS — N186 End stage renal disease: Secondary | ICD-10-CM | POA: Diagnosis not present

## 2017-10-15 DIAGNOSIS — D631 Anemia in chronic kidney disease: Secondary | ICD-10-CM | POA: Diagnosis not present

## 2017-10-15 DIAGNOSIS — D509 Iron deficiency anemia, unspecified: Secondary | ICD-10-CM | POA: Diagnosis not present

## 2017-10-15 DIAGNOSIS — N2581 Secondary hyperparathyroidism of renal origin: Secondary | ICD-10-CM | POA: Diagnosis not present

## 2017-10-16 DIAGNOSIS — M3214 Glomerular disease in systemic lupus erythematosus: Secondary | ICD-10-CM | POA: Diagnosis not present

## 2017-10-16 DIAGNOSIS — N186 End stage renal disease: Secondary | ICD-10-CM | POA: Diagnosis not present

## 2017-10-16 DIAGNOSIS — Z992 Dependence on renal dialysis: Secondary | ICD-10-CM | POA: Diagnosis not present

## 2017-10-18 DIAGNOSIS — N2581 Secondary hyperparathyroidism of renal origin: Secondary | ICD-10-CM | POA: Diagnosis not present

## 2017-10-18 DIAGNOSIS — N186 End stage renal disease: Secondary | ICD-10-CM | POA: Diagnosis not present

## 2017-10-18 DIAGNOSIS — D509 Iron deficiency anemia, unspecified: Secondary | ICD-10-CM | POA: Diagnosis not present

## 2017-10-18 DIAGNOSIS — D631 Anemia in chronic kidney disease: Secondary | ICD-10-CM | POA: Diagnosis not present

## 2017-10-20 DIAGNOSIS — N2581 Secondary hyperparathyroidism of renal origin: Secondary | ICD-10-CM | POA: Diagnosis not present

## 2017-10-20 DIAGNOSIS — D631 Anemia in chronic kidney disease: Secondary | ICD-10-CM | POA: Diagnosis not present

## 2017-10-20 DIAGNOSIS — N186 End stage renal disease: Secondary | ICD-10-CM | POA: Diagnosis not present

## 2017-10-20 DIAGNOSIS — D509 Iron deficiency anemia, unspecified: Secondary | ICD-10-CM | POA: Diagnosis not present

## 2017-10-22 DIAGNOSIS — D509 Iron deficiency anemia, unspecified: Secondary | ICD-10-CM | POA: Diagnosis not present

## 2017-10-22 DIAGNOSIS — N186 End stage renal disease: Secondary | ICD-10-CM | POA: Diagnosis not present

## 2017-10-22 DIAGNOSIS — N2581 Secondary hyperparathyroidism of renal origin: Secondary | ICD-10-CM | POA: Diagnosis not present

## 2017-10-22 DIAGNOSIS — D631 Anemia in chronic kidney disease: Secondary | ICD-10-CM | POA: Diagnosis not present

## 2017-10-25 DIAGNOSIS — D509 Iron deficiency anemia, unspecified: Secondary | ICD-10-CM | POA: Diagnosis not present

## 2017-10-25 DIAGNOSIS — N2581 Secondary hyperparathyroidism of renal origin: Secondary | ICD-10-CM | POA: Diagnosis not present

## 2017-10-25 DIAGNOSIS — N186 End stage renal disease: Secondary | ICD-10-CM | POA: Diagnosis not present

## 2017-10-25 DIAGNOSIS — D631 Anemia in chronic kidney disease: Secondary | ICD-10-CM | POA: Diagnosis not present

## 2017-10-27 ENCOUNTER — Telehealth: Payer: Self-pay | Admitting: Family Medicine

## 2017-10-27 DIAGNOSIS — N2581 Secondary hyperparathyroidism of renal origin: Secondary | ICD-10-CM | POA: Diagnosis not present

## 2017-10-27 DIAGNOSIS — D631 Anemia in chronic kidney disease: Secondary | ICD-10-CM | POA: Diagnosis not present

## 2017-10-27 DIAGNOSIS — N186 End stage renal disease: Secondary | ICD-10-CM | POA: Diagnosis not present

## 2017-10-27 DIAGNOSIS — D509 Iron deficiency anemia, unspecified: Secondary | ICD-10-CM | POA: Diagnosis not present

## 2017-10-27 NOTE — Telephone Encounter (Signed)
Copied from [place] [id number]. Topic: Quick Communication - See Telephone Encounter >> Oct 27, 2017 12:12 PM Vernona Rieger wrote: CRM for notification. See Telephone encounter for: 10/27/17.  Darlina Guys with [facility] called to let Dr [name] know that nursing will be going out on 9/16 to see her per her request.

## 2017-10-28 NOTE — Telephone Encounter (Signed)
FYI sent to Dr. [name]

## 2017-10-29 DIAGNOSIS — D509 Iron deficiency anemia, unspecified: Secondary | ICD-10-CM | POA: Diagnosis not present

## 2017-10-29 DIAGNOSIS — N2581 Secondary hyperparathyroidism of renal origin: Secondary | ICD-10-CM | POA: Diagnosis not present

## 2017-10-29 DIAGNOSIS — D631 Anemia in chronic kidney disease: Secondary | ICD-10-CM | POA: Diagnosis not present

## 2017-10-29 DIAGNOSIS — N186 End stage renal disease: Secondary | ICD-10-CM | POA: Diagnosis not present

## 2017-10-31 DIAGNOSIS — I120 Hypertensive chronic kidney disease with stage 5 chronic kidney disease or end stage renal disease: Secondary | ICD-10-CM | POA: Diagnosis not present

## 2017-10-31 DIAGNOSIS — N2581 Secondary hyperparathyroidism of renal origin: Secondary | ICD-10-CM | POA: Diagnosis not present

## 2017-10-31 DIAGNOSIS — M3214 Glomerular disease in systemic lupus erythematosus: Secondary | ICD-10-CM | POA: Diagnosis not present

## 2017-10-31 DIAGNOSIS — Z8673 Personal history of transient ischemic attack (TIA), and cerebral infarction without residual deficits: Secondary | ICD-10-CM | POA: Diagnosis not present

## 2017-10-31 DIAGNOSIS — N186 End stage renal disease: Secondary | ICD-10-CM | POA: Diagnosis not present

## 2017-10-31 DIAGNOSIS — N041 Nephrotic syndrome with focal and segmental glomerular lesions: Secondary | ICD-10-CM | POA: Diagnosis not present

## 2017-10-31 DIAGNOSIS — Z9181 History of falling: Secondary | ICD-10-CM | POA: Diagnosis not present

## 2017-10-31 DIAGNOSIS — J302 Other seasonal allergic rhinitis: Secondary | ICD-10-CM | POA: Diagnosis not present

## 2017-10-31 DIAGNOSIS — N200 Calculus of kidney: Secondary | ICD-10-CM | POA: Diagnosis not present

## 2017-10-31 DIAGNOSIS — Z992 Dependence on renal dialysis: Secondary | ICD-10-CM | POA: Diagnosis not present

## 2017-11-01 DIAGNOSIS — N2581 Secondary hyperparathyroidism of renal origin: Secondary | ICD-10-CM | POA: Diagnosis not present

## 2017-11-01 DIAGNOSIS — N186 End stage renal disease: Secondary | ICD-10-CM | POA: Diagnosis not present

## 2017-11-01 DIAGNOSIS — D509 Iron deficiency anemia, unspecified: Secondary | ICD-10-CM | POA: Diagnosis not present

## 2017-11-01 DIAGNOSIS — D631 Anemia in chronic kidney disease: Secondary | ICD-10-CM | POA: Diagnosis not present

## 2017-11-03 DIAGNOSIS — D509 Iron deficiency anemia, unspecified: Secondary | ICD-10-CM | POA: Diagnosis not present

## 2017-11-03 DIAGNOSIS — N2581 Secondary hyperparathyroidism of renal origin: Secondary | ICD-10-CM | POA: Diagnosis not present

## 2017-11-03 DIAGNOSIS — D631 Anemia in chronic kidney disease: Secondary | ICD-10-CM | POA: Diagnosis not present

## 2017-11-03 DIAGNOSIS — N186 End stage renal disease: Secondary | ICD-10-CM | POA: Diagnosis not present

## 2017-11-05 DIAGNOSIS — N186 End stage renal disease: Secondary | ICD-10-CM | POA: Diagnosis not present

## 2017-11-05 DIAGNOSIS — D509 Iron deficiency anemia, unspecified: Secondary | ICD-10-CM | POA: Diagnosis not present

## 2017-11-05 DIAGNOSIS — N2581 Secondary hyperparathyroidism of renal origin: Secondary | ICD-10-CM | POA: Diagnosis not present

## 2017-11-05 DIAGNOSIS — D631 Anemia in chronic kidney disease: Secondary | ICD-10-CM | POA: Diagnosis not present

## 2017-11-06 DIAGNOSIS — E785 Hyperlipidemia, unspecified: Secondary | ICD-10-CM | POA: Insufficient documentation

## 2017-11-07 ENCOUNTER — Ambulatory Visit (INDEPENDENT_AMBULATORY_CARE_PROVIDER_SITE_OTHER): Payer: Medicare Other | Admitting: Family Medicine

## 2017-11-07 ENCOUNTER — Other Ambulatory Visit (HOSPITAL_COMMUNITY)
Admission: RE | Admit: 2017-11-07 | Discharge: 2017-11-07 | Disposition: A | Discharge status: Home / Self Care | Payer: Medicare Other | Source: Ambulatory Visit | Attending: Family Medicine | Admitting: Family Medicine

## 2017-11-07 ENCOUNTER — Encounter: Payer: Self-pay | Admitting: Family Medicine

## 2017-11-07 VITALS — BP 118/70 | HR 80 | Temp 98.0°F | Resp 12 | Ht 67.0 in | Wt 196.0 lb

## 2017-11-07 DIAGNOSIS — N186 End stage renal disease: Secondary | ICD-10-CM

## 2017-11-07 DIAGNOSIS — N888 Other specified noninflammatory disorders of cervix uteri: Secondary | ICD-10-CM | POA: Diagnosis not present

## 2017-11-07 DIAGNOSIS — Z0000 Encounter for general adult medical examination without abnormal findings: Secondary | ICD-10-CM | POA: Diagnosis not present

## 2017-11-07 DIAGNOSIS — Z992 Dependence on renal dialysis: Secondary | ICD-10-CM

## 2017-11-07 DIAGNOSIS — E785 Hyperlipidemia, unspecified: Secondary | ICD-10-CM

## 2017-11-07 DIAGNOSIS — I129 Hypertensive chronic kidney disease with stage 1 through stage 4 chronic kidney disease, or unspecified chronic kidney disease: Secondary | ICD-10-CM

## 2017-11-07 DIAGNOSIS — Z23 Encounter for immunization: Secondary | ICD-10-CM

## 2017-11-07 DIAGNOSIS — Z124 Encounter for screening for malignant neoplasm of cervix: Secondary | ICD-10-CM

## 2017-11-07 NOTE — Progress Notes (Signed)
HPI:   Jenna [name] is a 62 y.o. female, who is here today for her routine physical.  Last CPE: 04/2012.  Regular exercise 3 or more time per week: No consistently, she walks sometimes once per week for about 10-15 min. Following a healthy diet: Yes. She lives with her daughter.  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 She had lab work a few days ago as part of her pre-transplant evaluation.  HTN and HLD on non pharmacologic treatment.   Pap smear: "Many years ago." Hx of abnormal pap smears: Denies. Hx of STD's Denies.  M: 12 G:2 L:1 LMP at age 51.  She is not sexually active.  Immunization History  Administered Date(s) Administered  . Influenza,inj,Quad PF,6+ Mos 11/19/2013, 10/31/2014  . Pneumococcal Polysaccharide-23 09/23/2017  . Tdap 09/23/2017    Mammogram: 2014, has appt. Colonoscopy: 08/25/12 DEXA: 12/2013  Hep C screening: NR 10/31/17.  She has no new concerns today.  MEDICARE PREVENTIVE VISIT:  A few ED visits, last 2 for fall and headache. Hospitalization on 07/05/17 due to ESRD. She is on hemodialysis (T-T-Sat),using right upper chest dialysis cath.She had LUE AV fistula but it is not ready to be utilized yet.  Functional Status Survey: Is the patient deaf or have difficulty hearing?: No Does the patient have difficulty seeing, even when wearing glasses/contacts?: No Does the patient have difficulty concentrating, remembering, or making decisions?: No Does the patient have difficulty walking or climbing stairs?: Yes(Sometimes.) Does the patient have difficulty dressing or bathing?: No Does the patient have difficulty doing errands alone such as visiting a doctor's office or shopping?: No  Fall Risk  11/07/2017 12/19/2014 10/31/2014 09/30/2014 07/04/2014  Falls in the past year? Yes No Yes Yes No  Number falls in past yr: - - 2  or more 2 or more -  Injury with Fall? Yes - Yes Yes -  Comment - - - - -  Risk Factor Category  - High Fall Risk High Fall Risk - -  Risk for fall due to : Impaired balance/gait - History of fall(s) - -  Follow up - - Education provided - -   She is doing PT through [facility].   Providers she sees regularly:  Eye care provider: Dr Kathrin Penner Nephrologist, Dr Page Cardiologist, Dr . Maryjane Hurter visit 10/31/17. Vascular, Dr Evelene Croon. Rheumatologist, Dr Amil Amen, last visit 2 months ago.   Depression screen [facility] 2/9 11/07/2017  Decreased Interest 0  Down, Depressed, Hopeless 0  PHQ - 2 Score 0  Altered sleeping -  Tired, decreased energy -  Change in appetite -  Feeling bad or failure about yourself  -  Trouble concentrating -  Moving slowly or fidgety/restless -  Suicidal thoughts -  PHQ-9 Score -    Mini-Cog - 11/07/17 1026    Normal clock drawing test?  yes    How many words correct?  3         Hearing Screening   '125Hz'$  $Remo'250Hz'nSQMp$'500Hz'$'1000Hz'$'2000Hz'$'3000Hz'$'4000Hz'$'6000Hz'$'8000Hz'$   Right ear:   Pass Pass Pass  Pass    Left ear:   Pass Pass Pass  Pass      Visual Acuity Screening   Right eye Left eye Both eyes  Without correction: 20/15 20/30   With correction: $RemoveBeforeDE'20/20 20/20 20/20 '[id number]$    Review of Systems  Constitutional: Positive for fatigue. Negative for appetite change and fever.  HENT:  Negative for dental problem, hearing loss, nosebleeds, trouble swallowing and voice change.   Eyes: Negative for photophobia and visual disturbance.  Respiratory: Negative for cough, shortness of breath and wheezing.   Cardiovascular: Negative for chest pain and leg swelling.  Gastrointestinal: Negative for abdominal pain, blood in stool, nausea and vomiting.       No changes in bowel habits.  Endocrine: Negative for cold intolerance, heat intolerance, polydipsia, polyphagia and polyuria.  Genitourinary: Negative for dysuria, hematuria, vaginal bleeding and vaginal discharge.       No breast tenderness  or nipple discharge.  Musculoskeletal: Positive for arthralgias, back pain and gait problem.  Skin: Negative for rash.  Neurological: Negative for syncope, weakness and headaches.  Hematological: Negative for adenopathy. Does not bruise/bleed easily.  Psychiatric/Behavioral: Negative for confusion and sleep disturbance. The patient is nervous/anxious.   All other systems reviewed and are negative.     Current Outpatient Medications on File Prior to Visit  Medication Sig Dispense Refill  . acetaminophen (TYLENOL) 500 MG tablet Take 1,000 mg by mouth every 6 (six) hours as needed for headache.    Marland Kitchen aspirin 81 MG tablet Take 1 tablet (81 mg total) by mouth daily. (Patient taking differently: Take 81 mg by mouth 2 (two) times a week. ) 90 tablet 3  . Cholecalciferol (VITAMIN D PO) Take by mouth once a week.    . diclofenac sodium (VOLTAREN) 1 % GEL Apply 4 g topically 4 (four) times daily. 4 Tube 0  . ethyl chloride spray   12  . fluticasone (FLONASE) 50 MCG/ACT nasal spray Place 1 spray into both nostrils 2 (two) times daily. 16 g 3  . HYDROcodone-acetaminophen (NORCO) 7.5-325 MG tablet Take 1 tablet by mouth every 6 (six) hours as needed for moderate pain or severe pain. 15 tablet 0  . hydroxychloroquine (PLAQUENIL) 200 MG tablet Take 1 tablet (200 mg total) by mouth 2 (two) times daily. 180 tablet 3  . lidocaine (LIDODERM) 5 % Place 1 patch onto the skin daily. Remove & Discard patch within 12 hours or as directed by MD 30 patch 0  . methocarbamol (ROBAXIN) 500 MG tablet Take 1 tablet (500 mg total) by mouth every 8 (eight) hours as needed. 30 tablet 0   No current facility-administered medications on file prior to visit.      Past Medical History:  Diagnosis Date  . CKD (chronic kidney disease) stage 5, GFR less than 15 ml/min ([name]) 02/2017   Followed by Dr. Florene Glen  . FSGS (focal segmental glomerulosclerosis) 2011   By renal biopsy  . Hx of lupus nephritis 2011   by renal biopsy    . Lupus (systemic lupus erythematosus) ([name])    followed by Dr. Amil Amen  . Obesity   . Renal stone   . Secondary hyperparathyroidism of renal origin [facility])     Past Surgical History:  Procedure Laterality Date  . AV FISTULA PLACEMENT Left 07/04/2017   Procedure: ARTERIOVENOUS (AV) FISTULA CREATION BRACHIOCEPHALIC;  Surgeon: [name], MD;  Location: [place];  Service: Vascular;  Laterality: Left;  . IR FLUORO GUIDE CV LINE RIGHT  06/28/2017  . IR US GUIDE VASC ACCESS RIGHT  06/28/2017    Allergies  Allergen Reactions  . Enalapril Maleate Anaphylaxis and Other (See Comments)    Throat swells  . Penicillin G   . Tape Itching and Other (See Comments)    Irritates the skin  . Chocolate Nausea And Vomiting  . Penicillins Other (See  Comments)    Made patient lightheaded Has patient had a PCN reaction causing immediate rash, facial/tongue/throat swelling, SOB or lightheadedness with hypotension: Yes Has patient had a PCN reaction causing severe rash involving mucus membranes or skin necrosis: No Has patient had a PCN reaction that required hospitalization: No Has patient had a PCN reaction occurring within the last 10 years: No If all of the above answers are "NO", then may proceed with Cephalosporin use.     Family History  Problem Relation Age of Onset  . Diabetes Mother   . Hypertension Father   . Cancer Sister   . Hypertension Brother     Social History   Socioeconomic History  . Marital status: Legally Separated    Spouse name: Not on file  . Number of children: Not on file  . Years of education: Not on file  . Highest education level: Not on file  Occupational History  . Not on file  Social Needs  . Financial resource strain: Not on file  . Food insecurity:    Worry: Not on file    Inability: Not on file  . Transportation needs:    Medical: Not on file    Non-medical: Not on file  Tobacco Use  . Smoking status: Never Smoker  . Smokeless tobacco: Never Used   Substance and Sexual Activity  . Alcohol use: No  . Drug use: No  . Sexual activity: Not on file  Lifestyle  . Physical activity:    Days per week: Not on file    Minutes per session: Not on file  . Stress: Not on file  Relationships  . Social connections:    Talks on phone: Not on file    Gets together: Not on file    Attends religious service: Not on file    Active member of club or organization: Not on file    Attends meetings of clubs or organizations: Not on file    Relationship status: Not on file  Other Topics Concern  . Not on file  Social History Narrative  . Not on file     Vitals:   11/07/17 1008  BP: 118/70  Pulse: 80  Resp: 12  Temp: 98 F (36.7 C)  SpO2: 97%   Body mass index is 30.7 kg/m.   Wt Readings from Last 3 Encounters:  11/07/17 196 lb (88.9 kg)  10/10/17 201 lb (91.2 kg)  09/23/17 198 lb (89.8 kg)      Physical Exam  Nursing note and vitals reviewed. Constitutional: She is oriented to person, place, and time. She appears well-developed. No distress.  HENT:  Head: Normocephalic and atraumatic.  Right Ear: Hearing and external ear normal.  Left Ear: Hearing and external ear normal.  Mouth/Throat: Uvula is midline, oropharynx is clear and moist and mucous membranes are normal.  Eyes: Pupils are equal, round, and reactive to light. Conjunctivae and EOM are normal.  Neck: No tracheal deviation present. No thyromegaly present.  Cardiovascular: Normal rate and regular rhythm.  No murmur heard. Pulses:      Dorsalis pedis pulses are 2+ on the right side, and 2+ on the left side.  Respiratory: Effort normal and breath sounds normal. No respiratory distress.  GI: Soft. She exhibits no mass. There is no hepatomegaly. There is no tenderness.  Genitourinary: There is no rash, tenderness or lesion on the right labia. There is no rash, tenderness or lesion on the left labia. Uterus is enlarged. Uterus is not tender.  Cervix exhibits friability.  Cervix exhibits no motion tenderness and no discharge. Right adnexum displays no mass, no tenderness and no fullness. Left adnexum displays no mass, no tenderness and no fullness.    Genitourinary Comments: Breast: No masses, skin abnormalities, or nipple discharge appreciated bilateral.  There is a small soft mass between 8-9 O'clock. It is friable and it seems to be extending through cervix.No tender.  Pap smear collected.   Musculoskeletal: She exhibits no edema.  No major deformity or signs of synovitis appreciated.  Lymphadenopathy:    She has no cervical adenopathy.       Right: No supraclavicular adenopathy present.       Left: No supraclavicular adenopathy present.  Neurological: She is alert and oriented to person, place, and time. She has normal strength. No cranial nerve deficit.  Reflex Scores:      Bicep reflexes are 2+ on the right side and 2+ on the left side.      Patellar reflexes are 2+ on the right side and 2+ on the left side. Mildly unstable gait,assisted by cane.  Skin: Skin is warm. No rash noted. No erythema.  Psychiatric: She has a normal mood and affect. Her speech is normal.  Well groomed, good eye contact.      ASSESSMENT AND PLAN:  Ms. TONJA JEZEWSKI was here today annual physical examination.   Orders Placed This Encounter  Procedures  . Flu Vaccine QUAD 36+ mos IM  . Ambulatory referral to Gynecology    Routine general medical examination at a health care facility  We discussed the importance of regular physical activity and healthy diet for prevention of chronic illness and/or complications. Preventive guidelines reviewed. Vaccination up to date. Influenza vaccine given today.  Ca++ 1000-1200 mg through her diet ideally and vit D [phone] Udaily recommended. Next CPE in a year.  Medicare annual wellness visit, subsequent  We discussed the importance of staying active, physically and mentally, as well as the benefits of a  healthy/balance diet. Low impact exercise that involve stretching and strengthing are ideal. We discussed preventive screening for the next 5-10 years, summery of recommendations given in AVS:  Colonoscopy due in 2024. Periodic eye exam and glaucoma screening. Diabetes screening annually. Pap smear done today. Has appt for mammogram 11/2017. Fall prevention.  Advance directives and end of life discussed. She does not have POA or living will,documentation.  Dyslipidemia  Last FLP done in 10/2017: TC 206,LDL 115,HDL 56,and TG 62. Continue non pharmacologic treatment. Lab can be related in 10/2018.   Mass of cervix  ?polyp,fibroid. Finding discussed. Gyn appt will be arranged.  -     Ambulatory referral to Gynecology   ESRD on dialysis [facility])  Continue following with nephrologist and pre transplant evaluation team. .  Cervical cancer screening -     Cytology - PAP ([place])  Hypertension with renal disease  Adequately controlled. Continue non pharmacologic treatment.   Need for immunization against influenza -     Flu Vaccine QUAD 36+ mos IM     Return in 1 year (on 11/08/2018).      Glynn Yepes G. [name], MD  [facility]. [place] office.

## 2017-11-07 NOTE — Patient Instructions (Addendum)
A few things to remember from today's visit:   Routine general medical examination at a health care facility  Prediabetes - Plan: Hemoglobin A1c  Dyslipidemia - Plan: Lipid panel  Mass of cervix - Plan: Ambulatory referral to Gynecology  Encounter for HCV screening test for high risk patient - Plan: Hepatitis C antibody screen  Cervical cancer screening - Plan: Cytology - PAP ([place])  Today you have you routine preventive visit.  A few tips:  -As we age balance is not as good as it was, so there is a higher risks for falls. Please remove small rugs and furniture that is "in your way" and could increase the risk of falls. Stretching exercises may help with fall prevention: Yoga and Tai Chi are some examples. Low impact exercise is better, so you are not very achy the next day.  -Sun screen and avoidance of direct sun light recommended. Caution with dehydration, if working outdoors be sure to drink enough fluids.  - Some medications are not safe as we age, increases the risk of side effects and can potentially interact with other medication you are also taken;  including some of over the counter medications. Be sure to let me know when you start a new medication even if it is a dietary/vitamin supplement.   -Healthy diet low in red meet/animal fat and sugar + regular physical activity is recommended.    Screening schedule for the next 5-10 years:  Colonoscopy every 5-10 years.  Glaucoma screening/eye exam every 1-2 years.  Mammogram for breast cancer screening annually.  Flu vaccine annually.  Diabetes and cholesterol screening  Pap smear every 3 to 5 years.  Fall prevention   Advance directives:  Please see a lawyer and/or go to this website to help you with advanced directives and designating a health care power of attorney so that your wishes will be followed should you become too ill to make your own medical  decisions.  [email or web address]   - Vaccines:  Tdap vaccine every 10 years.  Shingles vaccine recommended at age 15, could be given after 63 years of age but not sure about insurance coverage.   Pneumonia vaccines:  Prevnar 13 at 65 and Pneumovax at 42. Sometimes Pneumovax is giving earlier if history of smoking, lung disease,diabetes,kidney disease among some.

## 2017-11-08 ENCOUNTER — Telehealth: Payer: Self-pay | Admitting: *Deleted

## 2017-11-08 DIAGNOSIS — N2581 Secondary hyperparathyroidism of renal origin: Secondary | ICD-10-CM | POA: Diagnosis not present

## 2017-11-08 DIAGNOSIS — D509 Iron deficiency anemia, unspecified: Secondary | ICD-10-CM | POA: Diagnosis not present

## 2017-11-08 DIAGNOSIS — D631 Anemia in chronic kidney disease: Secondary | ICD-10-CM | POA: Diagnosis not present

## 2017-11-08 DIAGNOSIS — N186 End stage renal disease: Secondary | ICD-10-CM | POA: Diagnosis not present

## 2017-11-08 LAB — CYTOLOGY - PAP
Diagnosis: NEGATIVE
HPV: NOT DETECTED

## 2017-11-08 NOTE — Telephone Encounter (Signed)
Patient called c/o numbness in left hand and pain increased with use of hand. Dr. Augustin Coupe at [facility] instructed patient to call [facility]. Using [facility] for HD at present. Appt. Given with PA 11/09/17 per Lattie Haw.

## 2017-11-09 ENCOUNTER — Ambulatory Visit: Payer: Medicare Other

## 2017-11-10 DIAGNOSIS — D631 Anemia in chronic kidney disease: Secondary | ICD-10-CM | POA: Diagnosis not present

## 2017-11-10 DIAGNOSIS — N2581 Secondary hyperparathyroidism of renal origin: Secondary | ICD-10-CM | POA: Diagnosis not present

## 2017-11-10 DIAGNOSIS — D509 Iron deficiency anemia, unspecified: Secondary | ICD-10-CM | POA: Diagnosis not present

## 2017-11-10 DIAGNOSIS — N186 End stage renal disease: Secondary | ICD-10-CM | POA: Diagnosis not present

## 2017-11-11 ENCOUNTER — Encounter: Payer: Self-pay | Admitting: Family

## 2017-11-11 ENCOUNTER — Ambulatory Visit (INDEPENDENT_AMBULATORY_CARE_PROVIDER_SITE_OTHER): Payer: Medicare Other | Admitting: Family

## 2017-11-11 ENCOUNTER — Other Ambulatory Visit: Payer: Self-pay

## 2017-11-11 VITALS — BP 128/87 | HR 79 | Temp 97.0°F | Resp 16 | Ht 67.0 in | Wt 197.0 lb

## 2017-11-11 DIAGNOSIS — N186 End stage renal disease: Secondary | ICD-10-CM | POA: Diagnosis not present

## 2017-11-11 DIAGNOSIS — Z992 Dependence on renal dialysis: Secondary | ICD-10-CM

## 2017-11-11 DIAGNOSIS — T82590A Other mechanical complication of surgically created arteriovenous fistula, initial encounter: Secondary | ICD-10-CM | POA: Diagnosis not present

## 2017-11-11 NOTE — Progress Notes (Signed)
CC: Difficulty cannulating left brachiocephalic AV fistula   History of Present Illness  Jenna [name] is a 62 y.o. ([birth year]) female who is s/p left brachiocephalic AV fistula by Dr. Donnetta Hutching on 07/04/17.  She had her tunneled dialysis catheter placed in IR on Jun 28, 2017.  She states that she dialyzes T/T/S on [facility].  Pt was last evaluated on 08-15-17 by S. Rhyne PA-C. At that time fistula was maturing nicely and had an excellent thrill.  It became a little more difficult to feel in the proximal upper arm but should have enough to cannulate distally.  Duplex revealed that it was maturing nicely and the depth should be satisfactory.   Discussed with pt they could start using fistula on 10/04/17.  If she has any difficulty, she may need superficialization in the proximal portion of the fistula in the future.  -once the fistula has been used 2-3 times successfully, the tunneled catheter may be removed.   Pt returns today with c/o numbness in left hand and pain increased with use of hand. Dr. Augustin Coupe at [facility] instructed patient to call [facility]. Using [facility] for HD at present, same temporary catheter has been prent since May of 2019.  Documentation from Dr. Augustin Coupe indicate that he referred pt to [facility]VS to ligate the collateral and consider superficialization of outflow.  Refferd by Dr. Augustin Coupe for difficult cannulation. Inflow limb of fistula is easily palpable but upper fistula more difficult to palpate margins of vein. There is a very large collateral coming off  3-4" above the anastomosis.    Past Medical History:  Diagnosis Date  . CKD (chronic kidney disease) stage 5, GFR less than 15 ml/min ([name]) 02/2017   Followed by Dr. Florene Glen  . FSGS (focal segmental glomerulosclerosis) 2011   By renal biopsy  . Hx of lupus nephritis 2011   by renal biopsy  . Lupus (systemic lupus erythematosus) ([name])    followed by Dr. Amil Amen  . Obesity   . Renal stone   . Secondary hyperparathyroidism of renal origin [facility])      Social History Social History   Tobacco Use  . Smoking status: Never Smoker  . Smokeless tobacco: Never Used  Substance Use Topics  . Alcohol use: No  . Drug use: No    Family History Family History  Problem Relation Age of Onset  . Diabetes Mother   . Hypertension Father   . Cancer Sister   . Hypertension Brother     Surgical History Past Surgical History:  Procedure Laterality Date  . AV FISTULA PLACEMENT Left 07/04/2017   Procedure: ARTERIOVENOUS (AV) FISTULA CREATION BRACHIOCEPHALIC;  Surgeon: [name], MD;  Location: [place];  Service: Vascular;  Laterality: Left;  . IR FLUORO GUIDE CV LINE RIGHT  06/28/2017  . IR US GUIDE VASC ACCESS RIGHT  06/28/2017    Allergies  Allergen Reactions  . Enalapril Maleate Anaphylaxis and Other (See Comments)    Throat swells  . Penicillin G   . Tape Itching and Other (See Comments)    Irritates the skin  . Chocolate Nausea And Vomiting  . Penicillins Other (See Comments)    Made patient lightheaded Has patient had a PCN reaction causing immediate rash, facial/tongue/throat swelling, SOB or lightheadedness with hypotension: Yes Has patient had a PCN reaction causing severe rash involving mucus membranes or skin necrosis: No Has patient had a PCN reaction that required hospitalization: No Has patient had a PCN reaction occurring within the  last 10 years: No If all of the above answers are "NO", then may proceed with Cephalosporin use.     Current Outpatient Medications  Medication Sig Dispense Refill  . acetaminophen (TYLENOL) 500 MG tablet Take 1,000 mg by mouth every 6 (six) hours as needed for headache.    Marland Kitchen aspirin 81 MG tablet Take 1 tablet (81 mg total) by mouth daily. (Patient taking differently: Take 81 mg by mouth 2 (two) times a week. ) 90 tablet 3  . Cholecalciferol (VITAMIN D PO) Take by mouth once a week.    . diclofenac sodium (VOLTAREN) 1 % GEL Apply 4 g topically 4 (four) times daily. 4 Tube 0  . ethyl  chloride spray   12  . fluticasone (FLONASE) 50 MCG/ACT nasal spray Place 1 spray into both nostrils 2 (two) times daily. 16 g 3  . HYDROcodone-acetaminophen (NORCO) 7.5-325 MG tablet Take 1 tablet by mouth every 6 (six) hours as needed for moderate pain or severe pain. 15 tablet 0  . hydroxychloroquine (PLAQUENIL) 200 MG tablet Take 1 tablet (200 mg total) by mouth 2 (two) times daily. 180 tablet 3  . lidocaine (LIDODERM) 5 % Place 1 patch onto the skin daily. Remove & Discard patch within 12 hours or as directed by MD 30 patch 0  . methocarbamol (ROBAXIN) 500 MG tablet Take 1 tablet (500 mg total) by mouth every 8 (eight) hours as needed. 30 tablet 0   No current facility-administered medications for this visit.      REVIEW OF SYSTEMS: see HPI for pertinent positives and negatives    PHYSICAL EXAMINATION:  Vitals:   11/11/17 1144  BP: 128/87  Pulse: 79  Resp: 16  Temp: (!) 97 F (36.1 C)  TempSrc: Oral  SpO2: 100%  Weight: 197 lb (89.4 kg)  Height: $Remove'5\' 7"'GfmUUlA$  (1.702 m)   Body mass index is 30.85 kg/m.  General: The patient appears her stated age.   HEENT:  No gross abnormalities Pulmonary: Respirations are non-labored, CTAB Abdomen: Soft and non-tender with normal bowel sounds. Musculoskeletal: There are no major deformities.   Neurologic: No focal weakness or paresthesias are detected, bilateral hand grip is 5/5, sensation intact in left hand Skin: There are no ulcer or rashes noted. Psychiatric: The patient has normal affect. Cardiovascular: There is a regular rate and rhythm without significant murmur appreciated. Left AV fistula with palpable thrill at antecubital fossa, becomes less palpable the more proximal parts of AVF.  Bilateral radial pulse is 1+ palpable.    Outside Studies/Documentation 3 pages of outside documents were reviewed including: referral and progress notes from Dr. Augustin Coupe dated 11-07-17.  Medical Decision Making  Jenna [name] is a 62 y.o. female  who presents with difficulty cannulating left brachiocephalic; she is s/p left brachiocephalic AV fistula creation by Dr. Donnetta Hutching on 07/04/17.  She had her tunneled dialysis catheter placed in IR on Jun 28, 2017.  She states that she dialyzes T/T/S on [facility].   Dr. Donzetta Matters spoke with pt and her daughter and examined pt.   Will schedule for left cephalic vein transposition, pt asked that Dr. Donnetta Hutching perform this.    Clemon Chambers, RN, MSN, FNP-C [facility] [place] Office: [phone]  11/11/2017, 12:22 PM  Clinic MD: Donzetta Matters

## 2017-11-12 DIAGNOSIS — D509 Iron deficiency anemia, unspecified: Secondary | ICD-10-CM | POA: Diagnosis not present

## 2017-11-12 DIAGNOSIS — N2581 Secondary hyperparathyroidism of renal origin: Secondary | ICD-10-CM | POA: Diagnosis not present

## 2017-11-12 DIAGNOSIS — N186 End stage renal disease: Secondary | ICD-10-CM | POA: Diagnosis not present

## 2017-11-12 DIAGNOSIS — D631 Anemia in chronic kidney disease: Secondary | ICD-10-CM | POA: Diagnosis not present

## 2017-11-15 ENCOUNTER — Other Ambulatory Visit: Payer: Self-pay | Admitting: *Deleted

## 2017-11-15 DIAGNOSIS — N186 End stage renal disease: Secondary | ICD-10-CM | POA: Diagnosis not present

## 2017-11-15 DIAGNOSIS — N841 Polyp of cervix uteri: Secondary | ICD-10-CM | POA: Diagnosis not present

## 2017-11-15 DIAGNOSIS — I120 Hypertensive chronic kidney disease with stage 5 chronic kidney disease or end stage renal disease: Secondary | ICD-10-CM | POA: Diagnosis not present

## 2017-11-15 DIAGNOSIS — Z992 Dependence on renal dialysis: Secondary | ICD-10-CM | POA: Diagnosis not present

## 2017-11-15 DIAGNOSIS — N2581 Secondary hyperparathyroidism of renal origin: Secondary | ICD-10-CM | POA: Diagnosis not present

## 2017-11-15 DIAGNOSIS — N041 Nephrotic syndrome with focal and segmental glomerular lesions: Secondary | ICD-10-CM | POA: Diagnosis not present

## 2017-11-15 DIAGNOSIS — D631 Anemia in chronic kidney disease: Secondary | ICD-10-CM | POA: Diagnosis not present

## 2017-11-15 DIAGNOSIS — N200 Calculus of kidney: Secondary | ICD-10-CM | POA: Diagnosis not present

## 2017-11-15 DIAGNOSIS — M3214 Glomerular disease in systemic lupus erythematosus: Secondary | ICD-10-CM | POA: Diagnosis not present

## 2017-11-15 NOTE — Progress Notes (Signed)
Spoke with patient's daughter [name]. Confirmed arrival time of 6:30 am for surgery on 11/30/17 with Dr. Donzetta Matters. NPO past MN night prior. Must have driver and caregiver for discharge. Expect a call and follow the detailed instructions received from the hospital pre-admission department for this surgery. Verbalized understanding to call if any questions.

## 2017-11-16 ENCOUNTER — Encounter: Payer: Self-pay | Admitting: Obstetrics & Gynecology

## 2017-11-16 ENCOUNTER — Ambulatory Visit (INDEPENDENT_AMBULATORY_CARE_PROVIDER_SITE_OTHER): Payer: Medicare Other | Admitting: Obstetrics & Gynecology

## 2017-11-16 VITALS — BP 140/86 | Ht 65.0 in | Wt 196.0 lb

## 2017-11-16 DIAGNOSIS — N041 Nephrotic syndrome with focal and segmental glomerular lesions: Secondary | ICD-10-CM | POA: Diagnosis not present

## 2017-11-16 DIAGNOSIS — N841 Polyp of cervix uteri: Secondary | ICD-10-CM

## 2017-11-16 DIAGNOSIS — I120 Hypertensive chronic kidney disease with stage 5 chronic kidney disease or end stage renal disease: Secondary | ICD-10-CM | POA: Diagnosis not present

## 2017-11-16 DIAGNOSIS — N2581 Secondary hyperparathyroidism of renal origin: Secondary | ICD-10-CM | POA: Diagnosis not present

## 2017-11-16 DIAGNOSIS — M3214 Glomerular disease in systemic lupus erythematosus: Secondary | ICD-10-CM | POA: Diagnosis not present

## 2017-11-16 DIAGNOSIS — N200 Calculus of kidney: Secondary | ICD-10-CM | POA: Diagnosis not present

## 2017-11-16 DIAGNOSIS — N186 End stage renal disease: Secondary | ICD-10-CM | POA: Diagnosis not present

## 2017-11-16 NOTE — Progress Notes (Signed)
    Jenna [name] Feb 02, 1956 [id number]        62 y.o.  G2P0011   RP: Cervical polyp/bleeding post Pap test on 11/07/2017  HPI: Pap test negative 11/07/2017.  Cervix bled easily with Pap test "friable", polyp?  Menopause x 7 yrs, well on no HRT.  No PMB otherwise.  No pelvic pain.  Screening mammo scheduled.  SLE awaiting a kidney transplant.     OB History  Gravida Para Term Preterm AB Living  $Remov'2 1     1 1  'uSGRXi$ SAB TAB Ectopic Multiple Live Births  1            # Outcome Date GA Lbr Len/2nd Weight Sex Delivery Anes PTL Lv  2 SAB           1 Para             Past medical history,surgical history, problem list, medications, allergies, family history and social history were all reviewed and documented in the EPIC chart.   Directed ROS with pertinent positives and negatives documented in the history of present illness/assessment and plan.  Exam:  Vitals:   11/16/17 1221  BP: 140/86  Weight: 196 lb (88.9 kg)  Height: $Remove'5\' 5"'CbSFFLX$  (1.651 m)   General appearance:  Normal  Abdomen: Normal  Gynecologic exam: Vulva normal.  Speculum: Cervix with an endocervical polyp.  Otherwise normal.  Vagina normal.  Normal vaginal secretions.  Verbal consent obtained to remove the polyp.  Polyp grasped with the fenestrated clamp.  Removed by rotation of the clamp.  Specimen sent to pathology.  Silver nitrate applied to the base to complete hemostasis.  Well-tolerated and no complication.  Bimanual exam: Uterus anteverted, normal volume, mobile and nontender.  No adnexal mass felt.  Nontender bilaterally.   Assessment/Plan:  62 y.o. G2P0011   1. Cervical polyp Endocervical polyp removed.  No complication.  Sent to Pathology.  Recent Pap 10/2017 negative.    Screening mammo scheduled.  F/U Annual/Gyn exam in 1 year.  Counseling on above issues and coordination of care more than 50% for 20 minutes.  Princess Bruins MD, 12:32 PM 11/16/2017

## 2017-11-16 NOTE — Patient Instructions (Signed)
1. Cervical polyp Endocervical polyp removed.  No complication.  Sent to Pathology.  Recent Pap 10/2017 negative.    Screening mammo scheduled.  F/U Annual/Gyn exam in 1 year.  Jenna [name], it was a pleasure meeting you today!  I will inform you of your results as soon as they are available.

## 2017-11-17 DIAGNOSIS — N186 End stage renal disease: Secondary | ICD-10-CM | POA: Diagnosis not present

## 2017-11-17 DIAGNOSIS — N2581 Secondary hyperparathyroidism of renal origin: Secondary | ICD-10-CM | POA: Diagnosis not present

## 2017-11-17 DIAGNOSIS — D631 Anemia in chronic kidney disease: Secondary | ICD-10-CM | POA: Diagnosis not present

## 2017-11-18 LAB — PATHOLOGY

## 2017-11-18 LAB — TISSUE SPECIMEN

## 2017-11-19 DIAGNOSIS — D631 Anemia in chronic kidney disease: Secondary | ICD-10-CM | POA: Diagnosis not present

## 2017-11-19 DIAGNOSIS — N186 End stage renal disease: Secondary | ICD-10-CM | POA: Diagnosis not present

## 2017-11-19 DIAGNOSIS — N2581 Secondary hyperparathyroidism of renal origin: Secondary | ICD-10-CM | POA: Diagnosis not present

## 2017-11-22 DIAGNOSIS — N186 End stage renal disease: Secondary | ICD-10-CM | POA: Diagnosis not present

## 2017-11-22 DIAGNOSIS — N2581 Secondary hyperparathyroidism of renal origin: Secondary | ICD-10-CM | POA: Diagnosis not present

## 2017-11-22 DIAGNOSIS — D631 Anemia in chronic kidney disease: Secondary | ICD-10-CM | POA: Diagnosis not present

## 2017-11-24 DIAGNOSIS — N2581 Secondary hyperparathyroidism of renal origin: Secondary | ICD-10-CM | POA: Diagnosis not present

## 2017-11-24 DIAGNOSIS — D631 Anemia in chronic kidney disease: Secondary | ICD-10-CM | POA: Diagnosis not present

## 2017-11-24 DIAGNOSIS — N186 End stage renal disease: Secondary | ICD-10-CM | POA: Diagnosis not present

## 2017-11-25 DIAGNOSIS — M3214 Glomerular disease in systemic lupus erythematosus: Secondary | ICD-10-CM | POA: Diagnosis not present

## 2017-11-25 DIAGNOSIS — I120 Hypertensive chronic kidney disease with stage 5 chronic kidney disease or end stage renal disease: Secondary | ICD-10-CM | POA: Diagnosis not present

## 2017-11-25 DIAGNOSIS — N200 Calculus of kidney: Secondary | ICD-10-CM | POA: Diagnosis not present

## 2017-11-25 DIAGNOSIS — N041 Nephrotic syndrome with focal and segmental glomerular lesions: Secondary | ICD-10-CM | POA: Diagnosis not present

## 2017-11-25 DIAGNOSIS — N2581 Secondary hyperparathyroidism of renal origin: Secondary | ICD-10-CM | POA: Diagnosis not present

## 2017-11-25 DIAGNOSIS — N186 End stage renal disease: Secondary | ICD-10-CM | POA: Diagnosis not present

## 2017-11-26 DIAGNOSIS — D631 Anemia in chronic kidney disease: Secondary | ICD-10-CM | POA: Diagnosis not present

## 2017-11-26 DIAGNOSIS — N186 End stage renal disease: Secondary | ICD-10-CM | POA: Diagnosis not present

## 2017-11-26 DIAGNOSIS — N2581 Secondary hyperparathyroidism of renal origin: Secondary | ICD-10-CM | POA: Diagnosis not present

## 2017-11-28 ENCOUNTER — Ambulatory Visit
Admission: RE | Admit: 2017-11-28 | Discharge: 2017-11-28 | Disposition: A | Discharge status: Home / Self Care | Payer: Medicare Other | Source: Ambulatory Visit | Attending: Family Medicine | Admitting: Family Medicine

## 2017-11-28 DIAGNOSIS — Z1239 Encounter for other screening for malignant neoplasm of breast: Secondary | ICD-10-CM

## 2017-11-28 DIAGNOSIS — Z1231 Encounter for screening mammogram for malignant neoplasm of breast: Secondary | ICD-10-CM | POA: Diagnosis not present

## 2017-11-29 ENCOUNTER — Other Ambulatory Visit: Payer: Self-pay

## 2017-11-29 ENCOUNTER — Encounter (HOSPITAL_COMMUNITY): Payer: Self-pay | Admitting: *Deleted

## 2017-11-29 DIAGNOSIS — N2581 Secondary hyperparathyroidism of renal origin: Secondary | ICD-10-CM | POA: Diagnosis not present

## 2017-11-29 DIAGNOSIS — D631 Anemia in chronic kidney disease: Secondary | ICD-10-CM | POA: Diagnosis not present

## 2017-11-29 DIAGNOSIS — N186 End stage renal disease: Secondary | ICD-10-CM | POA: Diagnosis not present

## 2017-11-29 NOTE — Anesthesia Preprocedure Evaluation (Addendum)
Anesthesia Evaluation  Patient identified by MRN, date of birth, ID band Patient awake    Reviewed: Allergy & Precautions, H&P , NPO status , Patient's Chart, lab work & pertinent test results  Airway Mallampati: I  TM Distance: >3 FB Neck ROM: Full    Dental no notable dental hx. (+) Edentulous Upper, Edentulous Lower, Dental Advisory Given   Pulmonary neg pulmonary ROS,    Pulmonary exam normal breath sounds clear to auscultation       Cardiovascular Exercise Tolerance: Good hypertension,  Rhythm:Regular Rate:Normal     Neuro/Psych  Headaches, Anxiety Depression TIA   GI/Hepatic negative GI ROS, Neg liver ROS,   Endo/Other  negative endocrine ROS  Renal/GU ESRF and DialysisRenal disease  negative genitourinary   Musculoskeletal   Abdominal   Peds  Hematology negative hematology ROS (+)   Anesthesia Other Findings   Reproductive/Obstetrics negative OB ROS                            Anesthesia Physical Anesthesia Plan  ASA: III  Anesthesia Plan: MAC   Post-op Pain Management:    Induction: Intravenous  PONV Risk Score and Plan: 3 and Midazolam, Propofol infusion and Ondansetron  Airway Management Planned: Simple Face Mask  Additional Equipment:   Intra-op Plan:   Post-operative Plan:   Informed Consent: I have reviewed the patients History and Physical, chart, labs and discussed the procedure including the risks, benefits and alternatives for the proposed anesthesia with the patient or authorized representative who has indicated his/her understanding and acceptance.   Dental advisory given  Plan Discussed with: CRNA  Anesthesia Plan Comments:         Anesthesia Quick Evaluation

## 2017-11-30 ENCOUNTER — Encounter (HOSPITAL_COMMUNITY)
Admission: RE | Disposition: A | Discharge status: Home / Self Care | Payer: Self-pay | Source: Ambulatory Visit | Attending: Vascular Surgery

## 2017-11-30 ENCOUNTER — Telehealth: Payer: Self-pay | Admitting: Vascular Surgery

## 2017-11-30 ENCOUNTER — Ambulatory Visit (HOSPITAL_COMMUNITY)
Admission: RE | Admit: 2017-11-30 | Discharge: 2017-11-30 | Disposition: A | Discharge status: Home / Self Care | Payer: Medicare Other | Source: Ambulatory Visit | Attending: Vascular Surgery | Admitting: Vascular Surgery

## 2017-11-30 ENCOUNTER — Ambulatory Visit (HOSPITAL_COMMUNITY): Payer: Medicare Other | Admitting: Anesthesiology

## 2017-11-30 ENCOUNTER — Encounter (HOSPITAL_COMMUNITY): Payer: Self-pay | Admitting: Urology

## 2017-11-30 DIAGNOSIS — Z8673 Personal history of transient ischemic attack (TIA), and cerebral infarction without residual deficits: Secondary | ICD-10-CM | POA: Insufficient documentation

## 2017-11-30 DIAGNOSIS — I10 Essential (primary) hypertension: Secondary | ICD-10-CM | POA: Insufficient documentation

## 2017-11-30 DIAGNOSIS — Z6832 Body mass index (BMI) 32.0-32.9, adult: Secondary | ICD-10-CM | POA: Insufficient documentation

## 2017-11-30 DIAGNOSIS — T82898A Other specified complication of vascular prosthetic devices, implants and grafts, initial encounter: Secondary | ICD-10-CM | POA: Diagnosis not present

## 2017-11-30 DIAGNOSIS — Z87442 Personal history of urinary calculi: Secondary | ICD-10-CM | POA: Insufficient documentation

## 2017-11-30 DIAGNOSIS — N041 Nephrotic syndrome with focal and segmental glomerular lesions: Secondary | ICD-10-CM | POA: Diagnosis not present

## 2017-11-30 DIAGNOSIS — E669 Obesity, unspecified: Secondary | ICD-10-CM | POA: Insufficient documentation

## 2017-11-30 DIAGNOSIS — R7303 Prediabetes: Secondary | ICD-10-CM | POA: Insufficient documentation

## 2017-11-30 DIAGNOSIS — N200 Calculus of kidney: Secondary | ICD-10-CM | POA: Diagnosis not present

## 2017-11-30 DIAGNOSIS — N269 Renal sclerosis, unspecified: Secondary | ICD-10-CM | POA: Diagnosis not present

## 2017-11-30 DIAGNOSIS — G8929 Other chronic pain: Secondary | ICD-10-CM | POA: Insufficient documentation

## 2017-11-30 DIAGNOSIS — E785 Hyperlipidemia, unspecified: Secondary | ICD-10-CM | POA: Diagnosis not present

## 2017-11-30 DIAGNOSIS — M3214 Glomerular disease in systemic lupus erythematosus: Secondary | ICD-10-CM | POA: Diagnosis not present

## 2017-11-30 DIAGNOSIS — Z7951 Long term (current) use of inhaled steroids: Secondary | ICD-10-CM | POA: Diagnosis not present

## 2017-11-30 DIAGNOSIS — M549 Dorsalgia, unspecified: Secondary | ICD-10-CM | POA: Insufficient documentation

## 2017-11-30 DIAGNOSIS — Y832 Surgical operation with anastomosis, bypass or graft as the cause of abnormal reaction of the patient, or of later complication, without mention of misadventure at the time of the procedure: Secondary | ICD-10-CM | POA: Diagnosis not present

## 2017-11-30 DIAGNOSIS — E213 Hyperparathyroidism, unspecified: Secondary | ICD-10-CM | POA: Insufficient documentation

## 2017-11-30 DIAGNOSIS — N2581 Secondary hyperparathyroidism of renal origin: Secondary | ICD-10-CM | POA: Diagnosis not present

## 2017-11-30 DIAGNOSIS — N186 End stage renal disease: Secondary | ICD-10-CM | POA: Insufficient documentation

## 2017-11-30 DIAGNOSIS — Z79899 Other long term (current) drug therapy: Secondary | ICD-10-CM | POA: Insufficient documentation

## 2017-11-30 DIAGNOSIS — I120 Hypertensive chronic kidney disease with stage 5 chronic kidney disease or end stage renal disease: Secondary | ICD-10-CM | POA: Diagnosis not present

## 2017-11-30 DIAGNOSIS — Z7982 Long term (current) use of aspirin: Secondary | ICD-10-CM | POA: Diagnosis not present

## 2017-11-30 DIAGNOSIS — N185 Chronic kidney disease, stage 5: Secondary | ICD-10-CM | POA: Diagnosis not present

## 2017-11-30 DIAGNOSIS — Z791 Long term (current) use of non-steroidal anti-inflammatories (NSAID): Secondary | ICD-10-CM | POA: Insufficient documentation

## 2017-11-30 DIAGNOSIS — Z992 Dependence on renal dialysis: Secondary | ICD-10-CM | POA: Insufficient documentation

## 2017-11-30 DIAGNOSIS — T82590A Other mechanical complication of surgically created arteriovenous fistula, initial encounter: Secondary | ICD-10-CM | POA: Diagnosis not present

## 2017-11-30 HISTORY — DX: Depression, unspecified: F32.A

## 2017-11-30 HISTORY — DX: Transient cerebral ischemic attack, unspecified: G45.9

## 2017-11-30 HISTORY — DX: Major depressive disorder, single episode, unspecified: F32.9

## 2017-11-30 HISTORY — DX: Anxiety disorder, unspecified: F41.9

## 2017-11-30 HISTORY — DX: End stage renal disease: N18.6

## 2017-11-30 HISTORY — DX: Unspecified injury of head, initial encounter: S09.90XA

## 2017-11-30 HISTORY — DX: Disease of pericardium, unspecified: I31.9

## 2017-11-30 HISTORY — DX: Dyspnea, unspecified: R06.00

## 2017-11-30 HISTORY — DX: Personal history of urinary calculi: Z87.442

## 2017-11-30 HISTORY — PX: REVISION OF ARTERIOVENOUS GORETEX GRAFT: SHX6073

## 2017-11-30 LAB — POCT I-STAT 4, (NA,K, GLUC, HGB,HCT)
Glucose, Bld: 88 mg/dL (ref 70–99)
HEMATOCRIT: 35 % — AB (ref 36.0–46.0)
Hemoglobin: 11.9 g/dL — ABNORMAL LOW (ref 12.0–15.0)
Potassium: 2.8 mmol/L — ABNORMAL LOW (ref 3.5–5.1)
Sodium: 136 mmol/L (ref 135–145)

## 2017-11-30 SURGERY — REVISION OF ARTERIOVENOUS GORETEX GRAFT
Anesthesia: General | Site: Arm Upper | Laterality: Left

## 2017-11-30 MED ORDER — GLYCOPYRROLATE PF 0.2 MG/ML IJ SOSY
PREFILLED_SYRINGE | INTRAMUSCULAR | Status: DC | PRN
  Administered 2017-11-30: .1 mg via INTRAVENOUS

## 2017-11-30 MED ORDER — GLYCOPYRROLATE PF 0.2 MG/ML IJ SOSY
PREFILLED_SYRINGE | INTRAMUSCULAR | Status: AC
  Filled 2017-11-30: qty 1

## 2017-11-30 MED ORDER — PHENYLEPHRINE 40 MCG/ML (10ML) SYRINGE FOR IV PUSH (FOR BLOOD PRESSURE SUPPORT)
PREFILLED_SYRINGE | INTRAVENOUS | Status: DC | PRN
  Administered 2017-11-30: 80 ug via INTRAVENOUS

## 2017-11-30 MED ORDER — SODIUM CHLORIDE 0.9 % IV SOLN: INTRAVENOUS | Status: DC

## 2017-11-30 MED ORDER — VANCOMYCIN HCL IN DEXTROSE 1-5 GM/200ML-% IV SOLN
1000.0000 mg | INTRAVENOUS | Status: AC
  Administered 2017-11-30: 1000 mg via INTRAVENOUS

## 2017-11-30 MED ORDER — POTASSIUM CHLORIDE 10 MEQ/100ML IV SOLN
10.0000 meq | INTRAVENOUS | Status: AC
  Administered 2017-11-30 (×3): 10 meq via INTRAVENOUS
  Filled 2017-11-30 (×3): qty 100

## 2017-11-30 MED ORDER — PROPOFOL 10 MG/ML IV BOLUS
INTRAVENOUS | Status: AC
  Filled 2017-11-30: qty 20

## 2017-11-30 MED ORDER — HYDROMORPHONE HCL 1 MG/ML IJ SOLN
0.2500 mg | INTRAMUSCULAR | Status: DC | PRN
  Administered 2017-11-30 (×2): 0.5 mg via INTRAVENOUS

## 2017-11-30 MED ORDER — ONDANSETRON HCL 4 MG/2ML IJ SOLN
INTRAMUSCULAR | Status: DC | PRN
  Administered 2017-11-30: 4 mg via INTRAVENOUS

## 2017-11-30 MED ORDER — HYDROMORPHONE HCL 1 MG/ML IJ SOLN
INTRAMUSCULAR | Status: AC
  Filled 2017-11-30: qty 1

## 2017-11-30 MED ORDER — DEXAMETHASONE SODIUM PHOSPHATE 10 MG/ML IJ SOLN
INTRAMUSCULAR | Status: DC | PRN
  Administered 2017-11-30: 4 mg via INTRAVENOUS

## 2017-11-30 MED ORDER — DEXMEDETOMIDINE HCL IN NACL 200 MCG/50ML IV SOLN
INTRAVENOUS | Status: DC | PRN
  Administered 2017-11-30 (×2): 4 ug via INTRAVENOUS

## 2017-11-30 MED ORDER — MIDAZOLAM HCL 2 MG/2ML IJ SOLN
INTRAMUSCULAR | Status: AC
  Filled 2017-11-30: qty 2

## 2017-11-30 MED ORDER — VANCOMYCIN HCL IN DEXTROSE 1-5 GM/200ML-% IV SOLN
INTRAVENOUS | Status: AC
  Filled 2017-11-30: qty 200

## 2017-11-30 MED ORDER — 0.9 % SODIUM CHLORIDE (POUR BTL) OPTIME
TOPICAL | Status: DC | PRN
  Administered 2017-11-30: 1000 mL

## 2017-11-30 MED ORDER — SODIUM CHLORIDE 0.9 % IV SOLN
INTRAVENOUS | Status: AC
  Filled 2017-11-30: qty 1.2

## 2017-11-30 MED ORDER — LIDOCAINE-EPINEPHRINE (PF) 1 %-1:200000 IJ SOLN
INTRAMUSCULAR | Status: AC
  Filled 2017-11-30: qty 30

## 2017-11-30 MED ORDER — PHENYLEPHRINE 40 MCG/ML (10ML) SYRINGE FOR IV PUSH (FOR BLOOD PRESSURE SUPPORT)
PREFILLED_SYRINGE | INTRAVENOUS | Status: AC
  Filled 2017-11-30: qty 10

## 2017-11-30 MED ORDER — PROPOFOL 500 MG/50ML IV EMUL
INTRAVENOUS | Status: DC | PRN
  Administered 2017-11-30: 50 ug/kg/min via INTRAVENOUS

## 2017-11-30 MED ORDER — SODIUM CHLORIDE 0.9 % IV SOLN
INTRAVENOUS | Status: DC | PRN
  Administered 2017-11-30: 09:00:00

## 2017-11-30 MED ORDER — SODIUM CHLORIDE 0.9 % IV SOLN
INTRAVENOUS | Status: DC | PRN
  Administered 2017-11-30: 20 ug/min via INTRAVENOUS

## 2017-11-30 MED ORDER — FENTANYL CITRATE (PF) 100 MCG/2ML IJ SOLN
INTRAMUSCULAR | Status: DC | PRN
  Administered 2017-11-30: 50 ug via INTRAVENOUS

## 2017-11-30 MED ORDER — ONDANSETRON HCL 4 MG/2ML IJ SOLN
INTRAMUSCULAR | Status: AC
  Filled 2017-11-30: qty 2

## 2017-11-30 MED ORDER — CHLORHEXIDINE GLUCONATE 4 % EX LIQD: 60.0000 mL | Freq: Once | CUTANEOUS | Status: DC

## 2017-11-30 MED ORDER — PROPOFOL 10 MG/ML IV BOLUS
INTRAVENOUS | Status: DC | PRN
  Administered 2017-11-30: 100 mg via INTRAVENOUS
  Administered 2017-11-30: 20 mg via INTRAVENOUS

## 2017-11-30 MED ORDER — DEXAMETHASONE SODIUM PHOSPHATE 10 MG/ML IJ SOLN
INTRAMUSCULAR | Status: AC
  Filled 2017-11-30: qty 1

## 2017-11-30 MED ORDER — FENTANYL CITRATE (PF) 250 MCG/5ML IJ SOLN
INTRAMUSCULAR | Status: AC
  Filled 2017-11-30: qty 5

## 2017-11-30 MED ORDER — SODIUM CHLORIDE 0.9 % IV SOLN
INTRAVENOUS | Status: DC | PRN
  Administered 2017-11-30: 08:00:00 via INTRAVENOUS

## 2017-11-30 MED ORDER — LIDOCAINE 2% (20 MG/ML) 5 ML SYRINGE
INTRAMUSCULAR | Status: AC
  Filled 2017-11-30: qty 5

## 2017-11-30 MED ORDER — MIDAZOLAM HCL 5 MG/5ML IJ SOLN
INTRAMUSCULAR | Status: DC | PRN
  Administered 2017-11-30 (×2): 1 mg via INTRAVENOUS

## 2017-11-30 MED ORDER — LIDOCAINE-EPINEPHRINE (PF) 1 %-1:200000 IJ SOLN
INTRAMUSCULAR | Status: DC | PRN
  Administered 2017-11-30: 30 mL

## 2017-11-30 MED ORDER — HYDROCODONE-ACETAMINOPHEN 7.5-325 MG PO TABS: 1.0000 | ORAL_TABLET | Freq: Four times a day (QID) | ORAL | 0 refills | Status: DC | PRN

## 2017-11-30 SURGICAL SUPPLY — 30 items
ARMBAND PINK RESTRICT EXTREMIT (MISCELLANEOUS) ×4 IMPLANT
CANISTER SUCT 3000ML PPV (MISCELLANEOUS) ×2 IMPLANT
CLIP VESOCCLUDE MED 6/CT (CLIP) ×2 IMPLANT
CLIP VESOCCLUDE SM WIDE 6/CT (CLIP) ×4 IMPLANT
COVER WAND RF STERILE (DRAPES) ×2 IMPLANT
DERMABOND ADVANCED (GAUZE/BANDAGES/DRESSINGS) ×1
DERMABOND ADVANCED .7 DNX12 (GAUZE/BANDAGES/DRESSINGS) ×1 IMPLANT
ELECT REM PT RETURN 9FT ADLT (ELECTROSURGICAL) ×2
ELECTRODE REM PT RTRN 9FT ADLT (ELECTROSURGICAL) ×1 IMPLANT
GLOVE BIO SURGEON STRL SZ7.5 (GLOVE) ×2 IMPLANT
GOWN STRL REUS W/ TWL LRG LVL3 (GOWN DISPOSABLE) ×2 IMPLANT
GOWN STRL REUS W/ TWL XL LVL3 (GOWN DISPOSABLE) ×1 IMPLANT
GOWN STRL REUS W/TWL LRG LVL3 (GOWN DISPOSABLE) ×2
GOWN STRL REUS W/TWL XL LVL3 (GOWN DISPOSABLE) ×1
INSERT FOGARTY SM (MISCELLANEOUS) ×2 IMPLANT
KIT BASIN OR (CUSTOM PROCEDURE TRAY) ×2 IMPLANT
KIT TURNOVER KIT B (KITS) ×2 IMPLANT
NS IRRIG 1000ML POUR BTL (IV SOLUTION) ×2 IMPLANT
PACK CV ACCESS (CUSTOM PROCEDURE TRAY) ×2 IMPLANT
PAD ARMBOARD 7.5X6 YLW CONV (MISCELLANEOUS) ×4 IMPLANT
SUT GORETEX 6.0 TH-9 30 IN (SUTURE) ×2 IMPLANT
SUT GORETEX CV-6TTC-13 36IN (SUTURE) ×2 IMPLANT
SUT MNCRL AB 4-0 PS2 18 (SUTURE) ×2 IMPLANT
SUT PROLENE 6 0 BV (SUTURE) ×4 IMPLANT
SUT VIC AB 3-0 SH 27 (SUTURE) ×2
SUT VIC AB 3-0 SH 27X BRD (SUTURE) ×2 IMPLANT
SUT VIC AB 4-0 PS2 18 (SUTURE) ×2 IMPLANT
TOWEL GREEN STERILE (TOWEL DISPOSABLE) ×2 IMPLANT
UNDERPAD 30X30 (UNDERPADS AND DIAPERS) ×2 IMPLANT
WATER STERILE IRR 1000ML POUR (IV SOLUTION) ×2 IMPLANT

## 2017-11-30 NOTE — Anesthesia Procedure Notes (Signed)
Procedure Name: LMA Insertion Date/Time: 11/30/2017 9:07 AM Performed by: Renato Shin, CRNA Pre-anesthesia Checklist: Patient identified, Emergency Drugs available, Suction available and Patient being monitored Patient Re-evaluated:Patient Re-evaluated prior to induction Oxygen Delivery Method: Circle system utilized Preoxygenation: Pre-oxygenation with 100% oxygen Induction Type: IV induction Ventilation: Mask ventilation without difficulty LMA: LMA inserted LMA Size: 4.0 Number of attempts: 1 Placement Confirmation: positive ETCO2,  CO2 detector and breath sounds checked- equal and bilateral Tube secured with: Tape Dental Injury: Teeth and Oropharynx as per pre-operative assessment

## 2017-11-30 NOTE — Op Note (Signed)
    Patient name: Jenna [name] MRN: [id number] DOB: Apr 23, 1955 Sex: female  11/30/2017 Pre-operative Diagnosis: esrd Post-operative diagnosis:  Same Surgeon:  Erlene Quan C. Donzetta Matters, MD Assistant: Leontine Locket, PA Procedure Performed: Left arm cephalic vein superficialization with branch ligation  Indications: 62 year old female with history of end-stage renal disease now on dialysis via catheter.  She has a fistula that is been difficult to cannulate the left upper arm and is indicated for revision.  Findings: Fistula itself had 3 branches that were ligated.  We are able to superficial as it easily and at completion had a very strong thrill.   Procedure:  The patient was identified in the holding area and taken to room where she is placed supine the operative table initially MAC anesthesia was can then converted to LMA.  We anesthetized the arm overlying the fistula in the left upper extremity with 1% lidocaine with epinephrine after timeout was called.  2 counterincisions were made after evaluating the branches with ultrasound.  We then dissected down onto the fistula totally mobilized and divided the branches between ties.  We able to get it superficial enough that we were satisfied with that there.  We irrigated the wound we did excise some of the fat deep in the wound.  We then closed the wounds directly over top of the fistula with 4-0 Monocryl.  Dermabond was placed above that.  She tolerated procedure well without immediate complication.  All counts were correct at completion.    EBL 20 cc.   [name] C. Donzetta Matters, MD [facility] [place] Office: [phone] Pager: [phone]

## 2017-11-30 NOTE — Discharge Instructions (Signed)
° °  [facility]  Discharge Instructions  AV Fistula or Graft Surgery for Dialysis Access  Please refer to the following instructions for your post-procedure care. Your surgeon or physician assistant will discuss any changes with you.  Activity  You may drive the day following your surgery, if you are comfortable and no longer taking prescription pain medication. Resume full activity as the soreness in your incision resolves.  Bathing/Showering  You may shower after you go home. Keep your incision dry for 48 hours. Do not soak in a bathtub, hot tub, or swim until the incision heals completely. You may not shower if you have a hemodialysis catheter.  Incision Care  Clean your incision with mild soap and water after 48 hours. Pat the area dry with a clean towel. You do not need a bandage unless otherwise instructed. Do not apply any ointments or creams to your incision. You may have skin glue on your incision. Do not peel it off. It will come off on its own in about one week. Your arm may swell a bit after surgery. To reduce swelling use pillows to elevate your arm so it is above your heart. Your doctor will tell you if you need to lightly wrap your arm with an ACE bandage.  Diet  Resume your normal diet. There are not special food restrictions following this procedure. In order to heal from your surgery, it is CRITICAL to get adequate nutrition. Your body requires vitamins, minerals, and protein. Vegetables are the best source of vitamins and minerals. Vegetables also provide the perfect balance of protein. Processed food has little nutritional value, so try to avoid this.  Medications  Resume taking all of your medications. If your incision is causing pain, you may take over-the counter pain relievers such as acetaminophen (Tylenol). If you were prescribed a stronger pain medication, please be aware these medications can cause nausea and constipation. Prevent  nausea by taking the medication with a snack or meal. Avoid constipation by drinking plenty of fluids and eating foods with high amount of fiber, such as fruits, vegetables, and grains.  Do not take Tylenol if you are taking prescription pain medications.  Follow up Your surgeon may want to see you in the office following your access surgery. If so, this will be arranged at the time of your surgery.  Please call us immediately for any of the following conditions:  Increased pain, redness, drainage (pus) from your incision site Fever of 101 degrees or higher Severe or worsening pain at your incision site Hand pain or numbness.  Reduce your risk of vascular disease:  Stop smoking. If you would like help, call [facility] at 1-800-QUIT-NOW 434-251-8548) or [place] at [place] your cholesterol Maintain a desired weight Control your diabetes Keep your blood pressure down  Dialysis  It will take several weeks to several months for your new dialysis access to be ready for use. Your surgeon will determine when it is okay to use it. Your nephrologist will continue to direct your dialysis. You can continue to use your Permcath until your new access is ready for use.   11/30/2017 Jenna [name] [id number] 18-Mar-1955  Surgeon(s): Waynetta Sandy, MD  Procedure(s): REVISION OF ARTERIOVENOUS FISTULA LEFT ARM Superfistulization and branch ligation.  x Do not stick fistula for 6 weeks    If you have any questions, please call the office at [phone].

## 2017-11-30 NOTE — Transfer of Care (Signed)
Immediate Anesthesia Transfer of Care Note  Patient: Jenna [name]  Procedure(s) Performed: REVISION OF ARTERIOVENOUS FISTULA LEFT ARM Superfistulization and branch ligation. (Left Arm Upper)  Patient Location: PACU  Anesthesia Type:General  Level of Consciousness: awake, alert , oriented and patient cooperative  Airway & Oxygen Therapy: Patient Spontanous Breathing and Patient connected to face mask oxygen  Post-op Assessment: Report given to RN and Post -op Vital signs reviewed and stable  Post vital signs: Reviewed and stable  Last Vitals:  Vitals Value Taken Time  BP 142/57 11/30/2017  9:43 AM  Temp    Pulse 75 11/30/2017  9:45 AM  Resp 14 11/30/2017  9:45 AM  SpO2 100 % 11/30/2017  9:45 AM  Vitals shown include unvalidated device data.  Last Pain:  Vitals:   11/30/17 0640  TempSrc:   PainSc: 3       Patients Stated Pain Goal: 0 (00/76/22 6333)  Complications: No apparent anesthesia complications

## 2017-11-30 NOTE — H&P (Signed)
   History and Physical Update  The patient was interviewed and re-examined.  The patient's previous History and Physical has been reviewed and is unchanged from recent office visit. Plan for left arm superficialization vs transposition today.   Kejuan Bekker C. Donzetta Matters, MD [facility] [place] Office: [phone] Pager: [phone]  11/30/2017, 7:12 AM

## 2017-11-30 NOTE — Telephone Encounter (Signed)
-----   Message from Mena Goes, RN sent at 11/30/2017 10:01 AM EDT ----- Regarding: 3-4 weeks with PA    ----- Message ----- From: Gabriel Earing, PA-C Sent: 11/30/2017   9:40 AM EDT To: [facility] Admin Pool, [facility] Charge Pool  S/p superficialization and branch ligation of left arm fistula 11/30/17.  F/u in 3-4 weeks with PA on Dr. Claretha Cooper clinic day.  Thanks

## 2017-11-30 NOTE — Anesthesia Postprocedure Evaluation (Signed)
Anesthesia Post Note  Patient: [name]  Procedure(s) Performed: REVISION OF ARTERIOVENOUS FISTULA LEFT ARM Superfistulization and branch ligation. (Left Arm Upper)     Patient location during evaluation: PACU Anesthesia Type: General Level of consciousness: awake and alert Pain management: pain level controlled Vital Signs Assessment: post-procedure vital signs reviewed and stable Respiratory status: spontaneous breathing, nonlabored ventilation and respiratory function stable Cardiovascular status: blood pressure returned to baseline and stable Postop Assessment: no apparent nausea or vomiting Anesthetic complications: no    Last Vitals:  Vitals:   11/30/17 1100 11/30/17 1115  BP: (!) 145/66 (!) 141/76  Pulse: 70 79  Resp: 16 19  Temp:  (!) 36.3 C  SpO2: 100% 97%    Last Pain:  Vitals:   11/30/17 1030  TempSrc:   PainSc: 2                  Grantley Savage,W. EDMOND

## 2017-11-30 NOTE — Telephone Encounter (Signed)
sch appt lvm 12-30-17 3pm p/o PA

## 2017-12-01 ENCOUNTER — Encounter (HOSPITAL_COMMUNITY): Payer: Self-pay | Admitting: Vascular Surgery

## 2017-12-01 DIAGNOSIS — N2581 Secondary hyperparathyroidism of renal origin: Secondary | ICD-10-CM | POA: Diagnosis not present

## 2017-12-01 DIAGNOSIS — N186 End stage renal disease: Secondary | ICD-10-CM | POA: Diagnosis not present

## 2017-12-01 DIAGNOSIS — D631 Anemia in chronic kidney disease: Secondary | ICD-10-CM | POA: Diagnosis not present

## 2017-12-02 DIAGNOSIS — M3214 Glomerular disease in systemic lupus erythematosus: Secondary | ICD-10-CM | POA: Diagnosis not present

## 2017-12-02 DIAGNOSIS — I151 Hypertension secondary to other renal disorders: Secondary | ICD-10-CM | POA: Diagnosis not present

## 2017-12-02 DIAGNOSIS — Z992 Dependence on renal dialysis: Secondary | ICD-10-CM | POA: Diagnosis not present

## 2017-12-02 DIAGNOSIS — N051 Unspecified nephritic syndrome with focal and segmental glomerular lesions: Secondary | ICD-10-CM | POA: Diagnosis not present

## 2017-12-02 DIAGNOSIS — Z01810 Encounter for preprocedural cardiovascular examination: Secondary | ICD-10-CM | POA: Diagnosis not present

## 2017-12-02 DIAGNOSIS — Z79899 Other long term (current) drug therapy: Secondary | ICD-10-CM | POA: Diagnosis not present

## 2017-12-02 DIAGNOSIS — N186 End stage renal disease: Secondary | ICD-10-CM | POA: Diagnosis not present

## 2017-12-02 DIAGNOSIS — Z01812 Encounter for preprocedural laboratory examination: Secondary | ICD-10-CM | POA: Diagnosis not present

## 2017-12-03 DIAGNOSIS — N186 End stage renal disease: Secondary | ICD-10-CM | POA: Diagnosis not present

## 2017-12-03 DIAGNOSIS — D631 Anemia in chronic kidney disease: Secondary | ICD-10-CM | POA: Diagnosis not present

## 2017-12-03 DIAGNOSIS — N2581 Secondary hyperparathyroidism of renal origin: Secondary | ICD-10-CM | POA: Diagnosis not present

## 2017-12-06 DIAGNOSIS — D631 Anemia in chronic kidney disease: Secondary | ICD-10-CM | POA: Diagnosis not present

## 2017-12-06 DIAGNOSIS — N186 End stage renal disease: Secondary | ICD-10-CM | POA: Diagnosis not present

## 2017-12-06 DIAGNOSIS — N2581 Secondary hyperparathyroidism of renal origin: Secondary | ICD-10-CM | POA: Diagnosis not present

## 2017-12-07 DIAGNOSIS — M3214 Glomerular disease in systemic lupus erythematosus: Secondary | ICD-10-CM | POA: Diagnosis not present

## 2017-12-07 DIAGNOSIS — N200 Calculus of kidney: Secondary | ICD-10-CM | POA: Diagnosis not present

## 2017-12-07 DIAGNOSIS — N186 End stage renal disease: Secondary | ICD-10-CM | POA: Diagnosis not present

## 2017-12-07 DIAGNOSIS — N041 Nephrotic syndrome with focal and segmental glomerular lesions: Secondary | ICD-10-CM | POA: Diagnosis not present

## 2017-12-07 DIAGNOSIS — N2581 Secondary hyperparathyroidism of renal origin: Secondary | ICD-10-CM | POA: Diagnosis not present

## 2017-12-07 DIAGNOSIS — I120 Hypertensive chronic kidney disease with stage 5 chronic kidney disease or end stage renal disease: Secondary | ICD-10-CM | POA: Diagnosis not present

## 2017-12-08 DIAGNOSIS — N2581 Secondary hyperparathyroidism of renal origin: Secondary | ICD-10-CM | POA: Diagnosis not present

## 2017-12-08 DIAGNOSIS — N186 End stage renal disease: Secondary | ICD-10-CM | POA: Diagnosis not present

## 2017-12-08 DIAGNOSIS — D631 Anemia in chronic kidney disease: Secondary | ICD-10-CM | POA: Diagnosis not present

## 2017-12-10 DIAGNOSIS — N186 End stage renal disease: Secondary | ICD-10-CM | POA: Diagnosis not present

## 2017-12-10 DIAGNOSIS — N2581 Secondary hyperparathyroidism of renal origin: Secondary | ICD-10-CM | POA: Diagnosis not present

## 2017-12-10 DIAGNOSIS — D631 Anemia in chronic kidney disease: Secondary | ICD-10-CM | POA: Diagnosis not present

## 2017-12-12 ENCOUNTER — Telehealth: Payer: Self-pay | Admitting: *Deleted

## 2017-12-12 ENCOUNTER — Ambulatory Visit: Payer: Medicare Other | Admitting: Surgery

## 2017-12-12 NOTE — Telephone Encounter (Signed)
Patient called c/o arm pain and requested a call back and daughter called requesting a call back about pending appointment . Left messages at both numbers.

## 2017-12-13 DIAGNOSIS — N186 End stage renal disease: Secondary | ICD-10-CM | POA: Diagnosis not present

## 2017-12-13 DIAGNOSIS — D631 Anemia in chronic kidney disease: Secondary | ICD-10-CM | POA: Diagnosis not present

## 2017-12-13 DIAGNOSIS — N2581 Secondary hyperparathyroidism of renal origin: Secondary | ICD-10-CM | POA: Diagnosis not present

## 2017-12-15 DIAGNOSIS — N2581 Secondary hyperparathyroidism of renal origin: Secondary | ICD-10-CM | POA: Diagnosis not present

## 2017-12-15 DIAGNOSIS — N186 End stage renal disease: Secondary | ICD-10-CM | POA: Diagnosis not present

## 2017-12-15 DIAGNOSIS — D631 Anemia in chronic kidney disease: Secondary | ICD-10-CM | POA: Diagnosis not present

## 2017-12-16 DIAGNOSIS — M3214 Glomerular disease in systemic lupus erythematosus: Secondary | ICD-10-CM | POA: Diagnosis not present

## 2017-12-16 DIAGNOSIS — Z992 Dependence on renal dialysis: Secondary | ICD-10-CM | POA: Diagnosis not present

## 2017-12-16 DIAGNOSIS — N186 End stage renal disease: Secondary | ICD-10-CM | POA: Diagnosis not present

## 2017-12-17 DIAGNOSIS — D631 Anemia in chronic kidney disease: Secondary | ICD-10-CM | POA: Diagnosis not present

## 2017-12-17 DIAGNOSIS — N2581 Secondary hyperparathyroidism of renal origin: Secondary | ICD-10-CM | POA: Diagnosis not present

## 2017-12-17 DIAGNOSIS — N186 End stage renal disease: Secondary | ICD-10-CM | POA: Diagnosis not present

## 2017-12-20 DIAGNOSIS — D631 Anemia in chronic kidney disease: Secondary | ICD-10-CM | POA: Diagnosis not present

## 2017-12-20 DIAGNOSIS — N186 End stage renal disease: Secondary | ICD-10-CM | POA: Diagnosis not present

## 2017-12-20 DIAGNOSIS — N2581 Secondary hyperparathyroidism of renal origin: Secondary | ICD-10-CM | POA: Diagnosis not present

## 2017-12-22 DIAGNOSIS — N186 End stage renal disease: Secondary | ICD-10-CM | POA: Diagnosis not present

## 2017-12-22 DIAGNOSIS — D631 Anemia in chronic kidney disease: Secondary | ICD-10-CM | POA: Diagnosis not present

## 2017-12-22 DIAGNOSIS — N2581 Secondary hyperparathyroidism of renal origin: Secondary | ICD-10-CM | POA: Diagnosis not present

## 2017-12-24 DIAGNOSIS — D631 Anemia in chronic kidney disease: Secondary | ICD-10-CM | POA: Diagnosis not present

## 2017-12-24 DIAGNOSIS — N186 End stage renal disease: Secondary | ICD-10-CM | POA: Diagnosis not present

## 2017-12-24 DIAGNOSIS — N2581 Secondary hyperparathyroidism of renal origin: Secondary | ICD-10-CM | POA: Diagnosis not present

## 2017-12-27 DIAGNOSIS — N2581 Secondary hyperparathyroidism of renal origin: Secondary | ICD-10-CM | POA: Diagnosis not present

## 2017-12-27 DIAGNOSIS — D631 Anemia in chronic kidney disease: Secondary | ICD-10-CM | POA: Diagnosis not present

## 2017-12-27 DIAGNOSIS — N186 End stage renal disease: Secondary | ICD-10-CM | POA: Diagnosis not present

## 2017-12-29 DIAGNOSIS — N186 End stage renal disease: Secondary | ICD-10-CM | POA: Diagnosis not present

## 2017-12-29 DIAGNOSIS — N2581 Secondary hyperparathyroidism of renal origin: Secondary | ICD-10-CM | POA: Diagnosis not present

## 2017-12-29 DIAGNOSIS — D631 Anemia in chronic kidney disease: Secondary | ICD-10-CM | POA: Diagnosis not present

## 2017-12-30 ENCOUNTER — Ambulatory Visit (INDEPENDENT_AMBULATORY_CARE_PROVIDER_SITE_OTHER): Payer: Self-pay | Admitting: Physician Assistant

## 2017-12-30 VITALS — BP 132/79 | HR 83 | Temp 98.5°F | Resp 18 | Ht 65.0 in | Wt 196.0 lb

## 2017-12-30 DIAGNOSIS — Z992 Dependence on renal dialysis: Secondary | ICD-10-CM

## 2017-12-30 DIAGNOSIS — N186 End stage renal disease: Secondary | ICD-10-CM

## 2017-12-30 NOTE — Progress Notes (Signed)
POST OPERATIVE OFFICE NOTE    CC:  F/u for surgery  HPI: Jenna [name] is a 62 y.o. ([birth year]) female who is s/p left brachiocephalic AV fistula by Dr. Donnetta Hutching on 07/04/17. She had her tunneled dialysis catheter placed in IR on Jun 28, 2017. She states that she dialyzes T/T/S on [employer].  She is here today for follow visit s/p Left arm cephalic vein superficialization with branch ligation.  She denise pain, loss of sensation or loss of motor in the left UE.    Allergies  Allergen Reactions  . Enalapril Maleate Anaphylaxis and Other (See Comments)    Throat swells  . Penicillins Other (See Comments)    Made patient lightheaded PATIENT HAS HAD A PCN REACTION WITH IMMEDIATE RASH, FACIAL/TONGUE/THROAT SWELLING, SOB, OR LIGHTHEADEDNESS WITH HYPOTENSION:  #  #  YES  #  #  Has patient had a PCN reaction causing severe rash involving mucus membranes or skin necrosis: No Has patient had a PCN reaction that required hospitalization: No Has patient had a PCN reaction occurring within the last 10 years: No If all of the above answers are "NO", then may proceed with Cephalosporin use.   . Chocolate Nausea And Vomiting  . Tape Itching, Rash and Other (See Comments)    Current Outpatient Medications  Medication Sig Dispense Refill  . acetaminophen (TYLENOL) 500 MG tablet Take 1,000 mg by mouth every 6 (six) hours as needed for headache.    Marland Kitchen aspirin 81 MG tablet Take 1 tablet (81 mg total) by mouth daily. (Patient taking differently: Take 81 mg by mouth every 7 (seven) days. ) 90 tablet 3  . diclofenac sodium (VOLTAREN) 1 % GEL Apply 4 g topically 4 (four) times daily. (Patient taking differently: Apply 4 g topically daily as needed (pain). ) 4 Tube 0  . fluticasone (FLONASE) 50 MCG/ACT nasal spray Place 1 spray into both nostrils 2 (two) times daily. (Patient taking differently: Place 1 spray into both nostrils daily as needed for allergies. ) 16 g 3  . HYDROcodone-acetaminophen (NORCO) 7.5-325  MG tablet Take 1 tablet by mouth every 6 (six) hours as needed for moderate pain or severe pain. 8 tablet 0  . hydroxychloroquine (PLAQUENIL) 200 MG tablet Take 1 tablet (200 mg total) by mouth 2 (two) times daily. 180 tablet 3  . lidocaine (LIDODERM) 5 % Place 1 patch onto the skin daily. Remove & Discard patch within 12 hours or as directed by MD (Patient not taking: Reported on 11/25/2017) 30 patch 0  . methocarbamol (ROBAXIN) 500 MG tablet Take 1 tablet (500 mg total) by mouth every 8 (eight) hours as needed. (Patient not taking: Reported on 11/25/2017) 30 tablet 0  . multivitamin (RENA-VIT) TABS tablet Take 1 tablet by mouth daily.     No current facility-administered medications for this visit.      ROS:  See HPI  Physical Exam:  Vitals:   12/30/17 1529  BP: 132/79  Pulse: 83  Resp: 18  Temp: 98.5 F (36.9 C)  SpO2: 99%    Incision:  Well healed with slight tenderness to palpation. Extremities: Easily Palpable thrill throughout the fistula, sensation intact, palpable radial pulse, grip 5/5   Assessment/Plan:  This is a 62 y.o. female who is s/p:Left arm cephalic vein superficialization with branch ligation   The fistula is easily palpable and is ready to be accessed for HD.  Once the fistula is running well she can be scheduled to have her catheter removed by  IR.  F/U PRN if she has any problems in the future.    Roxy Horseman PA-C [facility] [phone]

## 2017-12-31 DIAGNOSIS — N186 End stage renal disease: Secondary | ICD-10-CM | POA: Diagnosis not present

## 2017-12-31 DIAGNOSIS — D631 Anemia in chronic kidney disease: Secondary | ICD-10-CM | POA: Diagnosis not present

## 2017-12-31 DIAGNOSIS — N2581 Secondary hyperparathyroidism of renal origin: Secondary | ICD-10-CM | POA: Diagnosis not present

## 2018-01-03 DIAGNOSIS — D631 Anemia in chronic kidney disease: Secondary | ICD-10-CM | POA: Diagnosis not present

## 2018-01-03 DIAGNOSIS — N2581 Secondary hyperparathyroidism of renal origin: Secondary | ICD-10-CM | POA: Diagnosis not present

## 2018-01-03 DIAGNOSIS — N186 End stage renal disease: Secondary | ICD-10-CM | POA: Diagnosis not present

## 2018-01-05 DIAGNOSIS — N186 End stage renal disease: Secondary | ICD-10-CM | POA: Diagnosis not present

## 2018-01-05 DIAGNOSIS — N2581 Secondary hyperparathyroidism of renal origin: Secondary | ICD-10-CM | POA: Diagnosis not present

## 2018-01-05 DIAGNOSIS — D631 Anemia in chronic kidney disease: Secondary | ICD-10-CM | POA: Diagnosis not present

## 2018-01-07 DIAGNOSIS — N186 End stage renal disease: Secondary | ICD-10-CM | POA: Diagnosis not present

## 2018-01-07 DIAGNOSIS — D631 Anemia in chronic kidney disease: Secondary | ICD-10-CM | POA: Diagnosis not present

## 2018-01-07 DIAGNOSIS — N2581 Secondary hyperparathyroidism of renal origin: Secondary | ICD-10-CM | POA: Diagnosis not present

## 2018-01-09 DIAGNOSIS — D631 Anemia in chronic kidney disease: Secondary | ICD-10-CM | POA: Diagnosis not present

## 2018-01-09 DIAGNOSIS — N2581 Secondary hyperparathyroidism of renal origin: Secondary | ICD-10-CM | POA: Diagnosis not present

## 2018-01-09 DIAGNOSIS — N186 End stage renal disease: Secondary | ICD-10-CM | POA: Diagnosis not present

## 2018-01-11 DIAGNOSIS — N186 End stage renal disease: Secondary | ICD-10-CM | POA: Diagnosis not present

## 2018-01-11 DIAGNOSIS — N2581 Secondary hyperparathyroidism of renal origin: Secondary | ICD-10-CM | POA: Diagnosis not present

## 2018-01-11 DIAGNOSIS — D631 Anemia in chronic kidney disease: Secondary | ICD-10-CM | POA: Diagnosis not present

## 2018-01-14 DIAGNOSIS — D631 Anemia in chronic kidney disease: Secondary | ICD-10-CM | POA: Diagnosis not present

## 2018-01-14 DIAGNOSIS — N186 End stage renal disease: Secondary | ICD-10-CM | POA: Diagnosis not present

## 2018-01-14 DIAGNOSIS — N2581 Secondary hyperparathyroidism of renal origin: Secondary | ICD-10-CM | POA: Diagnosis not present

## 2018-01-15 DIAGNOSIS — M3214 Glomerular disease in systemic lupus erythematosus: Secondary | ICD-10-CM | POA: Diagnosis not present

## 2018-01-15 DIAGNOSIS — N186 End stage renal disease: Secondary | ICD-10-CM | POA: Diagnosis not present

## 2018-01-15 DIAGNOSIS — Z992 Dependence on renal dialysis: Secondary | ICD-10-CM | POA: Diagnosis not present

## 2018-01-17 DIAGNOSIS — N2581 Secondary hyperparathyroidism of renal origin: Secondary | ICD-10-CM | POA: Diagnosis not present

## 2018-01-17 DIAGNOSIS — D631 Anemia in chronic kidney disease: Secondary | ICD-10-CM | POA: Diagnosis not present

## 2018-01-17 DIAGNOSIS — N186 End stage renal disease: Secondary | ICD-10-CM | POA: Diagnosis not present

## 2018-01-19 DIAGNOSIS — N2581 Secondary hyperparathyroidism of renal origin: Secondary | ICD-10-CM | POA: Diagnosis not present

## 2018-01-19 DIAGNOSIS — N186 End stage renal disease: Secondary | ICD-10-CM | POA: Diagnosis not present

## 2018-01-19 DIAGNOSIS — D631 Anemia in chronic kidney disease: Secondary | ICD-10-CM | POA: Diagnosis not present

## 2018-01-21 DIAGNOSIS — D631 Anemia in chronic kidney disease: Secondary | ICD-10-CM | POA: Diagnosis not present

## 2018-01-21 DIAGNOSIS — N186 End stage renal disease: Secondary | ICD-10-CM | POA: Diagnosis not present

## 2018-01-21 DIAGNOSIS — N2581 Secondary hyperparathyroidism of renal origin: Secondary | ICD-10-CM | POA: Diagnosis not present

## 2018-01-24 DIAGNOSIS — N2581 Secondary hyperparathyroidism of renal origin: Secondary | ICD-10-CM | POA: Diagnosis not present

## 2018-01-24 DIAGNOSIS — D631 Anemia in chronic kidney disease: Secondary | ICD-10-CM | POA: Diagnosis not present

## 2018-01-24 DIAGNOSIS — N186 End stage renal disease: Secondary | ICD-10-CM | POA: Diagnosis not present

## 2018-01-25 DIAGNOSIS — M546 Pain in thoracic spine: Secondary | ICD-10-CM | POA: Diagnosis not present

## 2018-01-25 DIAGNOSIS — M329 Systemic lupus erythematosus, unspecified: Secondary | ICD-10-CM | POA: Diagnosis not present

## 2018-01-25 DIAGNOSIS — M170 Bilateral primary osteoarthritis of knee: Secondary | ICD-10-CM | POA: Diagnosis not present

## 2018-01-25 DIAGNOSIS — Z6829 Body mass index (BMI) 29.0-29.9, adult: Secondary | ICD-10-CM | POA: Diagnosis not present

## 2018-01-25 DIAGNOSIS — R29818 Other symptoms and signs involving the nervous system: Secondary | ICD-10-CM | POA: Diagnosis not present

## 2018-01-25 DIAGNOSIS — E663 Overweight: Secondary | ICD-10-CM | POA: Diagnosis not present

## 2018-01-25 DIAGNOSIS — M25561 Pain in right knee: Secondary | ICD-10-CM | POA: Diagnosis not present

## 2018-01-25 DIAGNOSIS — F3289 Other specified depressive episodes: Secondary | ICD-10-CM | POA: Diagnosis not present

## 2018-01-25 DIAGNOSIS — M3214 Glomerular disease in systemic lupus erythematosus: Secondary | ICD-10-CM | POA: Diagnosis not present

## 2018-01-25 DIAGNOSIS — M150 Primary generalized (osteo)arthritis: Secondary | ICD-10-CM | POA: Diagnosis not present

## 2018-01-26 DIAGNOSIS — D631 Anemia in chronic kidney disease: Secondary | ICD-10-CM | POA: Diagnosis not present

## 2018-01-26 DIAGNOSIS — N2581 Secondary hyperparathyroidism of renal origin: Secondary | ICD-10-CM | POA: Diagnosis not present

## 2018-01-26 DIAGNOSIS — N186 End stage renal disease: Secondary | ICD-10-CM | POA: Diagnosis not present

## 2018-01-28 DIAGNOSIS — D631 Anemia in chronic kidney disease: Secondary | ICD-10-CM | POA: Diagnosis not present

## 2018-01-28 DIAGNOSIS — N2581 Secondary hyperparathyroidism of renal origin: Secondary | ICD-10-CM | POA: Diagnosis not present

## 2018-01-28 DIAGNOSIS — N186 End stage renal disease: Secondary | ICD-10-CM | POA: Diagnosis not present

## 2018-01-30 ENCOUNTER — Telehealth: Payer: Self-pay

## 2018-01-30 NOTE — Telephone Encounter (Signed)
Pt.'s daughter Vincie called c/o pt's knot over fistula not improving. Pt. Is going to dialysis tomorrow. A[place]inn made an appt. For pt. To be seen here Wednesday. I explained to her that dialysis will assess fistula tomorrow and call us directly with their assessment. Daughter verbalized understanding and had no further questions at this time.

## 2018-01-31 DIAGNOSIS — N186 End stage renal disease: Secondary | ICD-10-CM | POA: Diagnosis not present

## 2018-01-31 DIAGNOSIS — D631 Anemia in chronic kidney disease: Secondary | ICD-10-CM | POA: Diagnosis not present

## 2018-01-31 DIAGNOSIS — N2581 Secondary hyperparathyroidism of renal origin: Secondary | ICD-10-CM | POA: Diagnosis not present

## 2018-02-01 ENCOUNTER — Ambulatory Visit (INDEPENDENT_AMBULATORY_CARE_PROVIDER_SITE_OTHER): Payer: Self-pay | Admitting: Physician Assistant

## 2018-02-01 VITALS — BP 125/73 | HR 80 | Temp 96.9°F | Resp 12 | Ht 67.0 in | Wt 189.0 lb

## 2018-02-01 DIAGNOSIS — N186 End stage renal disease: Secondary | ICD-10-CM

## 2018-02-01 DIAGNOSIS — Z992 Dependence on renal dialysis: Secondary | ICD-10-CM

## 2018-02-01 NOTE — Progress Notes (Signed)
POST OPERATIVE OFFICE NOTE    CC:  F/u for surgery  HPI:  This is a 62 y.o. female who is s/p Left arm cephalic vein superficialization with branch ligation 11/30/2017.  She had her left AV fistula infiltrated 3 weeks ago and was scheduled for examination.  She has rested it and they have been using her right IJ TDC for the past 3 weeks.    She states she has pain with firmness over the proximal stick site.  Her edema is subsiding and the pain is resolving with rest.  She denise loss of sensation and motor.  She denise fever and chills.   Allergies  Allergen Reactions  . Enalapril Maleate Anaphylaxis and Other (See Comments)    Throat swells  . Penicillins Other (See Comments)    Made patient lightheaded PATIENT HAS HAD A PCN REACTION WITH IMMEDIATE RASH, FACIAL/TONGUE/THROAT SWELLING, SOB, OR LIGHTHEADEDNESS WITH HYPOTENSION:  #  #  YES  #  #  Has patient had a PCN reaction causing severe rash involving mucus membranes or skin necrosis: No Has patient had a PCN reaction that required hospitalization: No Has patient had a PCN reaction occurring within the last 10 years: No If all of the above answers are "NO", then may proceed with Cephalosporin use.   . Chocolate Nausea And Vomiting  . Tape Itching, Rash and Other (See Comments)    Current Outpatient Medications  Medication Sig Dispense Refill  . acetaminophen (TYLENOL) 500 MG tablet Take 1,000 mg by mouth every 6 (six) hours as needed for headache.    Marland Kitchen aspirin 81 MG tablet Take 1 tablet (81 mg total) by mouth daily. (Patient taking differently: Take 81 mg by mouth every 7 (seven) days. ) 90 tablet 3  . diclofenac sodium (VOLTAREN) 1 % GEL Apply 4 g topically 4 (four) times daily. (Patient taking differently: Apply 4 g topically daily as needed (pain). ) 4 Tube 0  . fluticasone (FLONASE) 50 MCG/ACT nasal spray Place 1 spray into both nostrils 2 (two) times daily. (Patient taking differently: Place 1 spray into both nostrils daily as  needed for allergies. ) 16 g 3  . HYDROcodone-acetaminophen (NORCO) 7.5-325 MG tablet Take 1 tablet by mouth every 6 (six) hours as needed for moderate pain or severe pain. 8 tablet 0  . hydroxychloroquine (PLAQUENIL) 200 MG tablet Take 1 tablet (200 mg total) by mouth 2 (two) times daily. 180 tablet 3  . lidocaine (LIDODERM) 5 % Place 1 patch onto the skin daily. Remove & Discard patch within 12 hours or as directed by MD 30 patch 0  . methocarbamol (ROBAXIN) 500 MG tablet Take 1 tablet (500 mg total) by mouth every 8 (eight) hours as needed. 30 tablet 0  . multivitamin (RENA-VIT) TABS tablet Take 1 tablet by mouth daily.     No current facility-administered medications for this visit.      ROS:  See HPI  Physical Exam:  Vitals:   02/01/18 1508  BP: 125/73  Pulse: 80  Resp: 12  Temp: (!) 96.9 F (36.1 C)  SpO2: 97%    Incision:  Well healed incision with palpable thrill throughout the fistula.  Ecchymosis.  Compartments are soft.  Sensation intact equal B UE.  Grip 5/5 equal B.  Localized proximal firmness over stick site, no active bleeding.   Assessment/Plan:  This is a 62 y.o. female who is s/p:Left arm cephalic vein superficialization with branch ligation Recent infiltration at stick site.  I would continue  to rest the fistula until the pain has resolved for at least 2 more weeks, then start access again.  The hematoma appears to be resolving at this time.   Roxy Horseman PA-C [facility] [phone]

## 2018-02-02 DIAGNOSIS — D631 Anemia in chronic kidney disease: Secondary | ICD-10-CM | POA: Diagnosis not present

## 2018-02-02 DIAGNOSIS — N186 End stage renal disease: Secondary | ICD-10-CM | POA: Diagnosis not present

## 2018-02-02 DIAGNOSIS — N2581 Secondary hyperparathyroidism of renal origin: Secondary | ICD-10-CM | POA: Diagnosis not present

## 2018-02-04 DIAGNOSIS — D631 Anemia in chronic kidney disease: Secondary | ICD-10-CM | POA: Diagnosis not present

## 2018-02-04 DIAGNOSIS — N2581 Secondary hyperparathyroidism of renal origin: Secondary | ICD-10-CM | POA: Diagnosis not present

## 2018-02-04 DIAGNOSIS — N186 End stage renal disease: Secondary | ICD-10-CM | POA: Diagnosis not present

## 2018-02-06 DIAGNOSIS — D631 Anemia in chronic kidney disease: Secondary | ICD-10-CM | POA: Diagnosis not present

## 2018-02-06 DIAGNOSIS — N2581 Secondary hyperparathyroidism of renal origin: Secondary | ICD-10-CM | POA: Diagnosis not present

## 2018-02-06 DIAGNOSIS — N186 End stage renal disease: Secondary | ICD-10-CM | POA: Diagnosis not present

## 2018-02-09 DIAGNOSIS — D631 Anemia in chronic kidney disease: Secondary | ICD-10-CM | POA: Diagnosis not present

## 2018-02-09 DIAGNOSIS — N2581 Secondary hyperparathyroidism of renal origin: Secondary | ICD-10-CM | POA: Diagnosis not present

## 2018-02-09 DIAGNOSIS — N186 End stage renal disease: Secondary | ICD-10-CM | POA: Diagnosis not present

## 2018-02-11 DIAGNOSIS — D631 Anemia in chronic kidney disease: Secondary | ICD-10-CM | POA: Diagnosis not present

## 2018-02-11 DIAGNOSIS — N2581 Secondary hyperparathyroidism of renal origin: Secondary | ICD-10-CM | POA: Diagnosis not present

## 2018-02-11 DIAGNOSIS — N186 End stage renal disease: Secondary | ICD-10-CM | POA: Diagnosis not present

## 2018-02-13 DIAGNOSIS — D631 Anemia in chronic kidney disease: Secondary | ICD-10-CM | POA: Diagnosis not present

## 2018-02-13 DIAGNOSIS — N2581 Secondary hyperparathyroidism of renal origin: Secondary | ICD-10-CM | POA: Diagnosis not present

## 2018-02-13 DIAGNOSIS — N186 End stage renal disease: Secondary | ICD-10-CM | POA: Diagnosis not present

## 2018-02-15 DIAGNOSIS — Z992 Dependence on renal dialysis: Secondary | ICD-10-CM | POA: Diagnosis not present

## 2018-02-15 DIAGNOSIS — N186 End stage renal disease: Secondary | ICD-10-CM | POA: Diagnosis not present

## 2018-02-15 DIAGNOSIS — M3214 Glomerular disease in systemic lupus erythematosus: Secondary | ICD-10-CM | POA: Diagnosis not present

## 2018-02-16 DIAGNOSIS — N2581 Secondary hyperparathyroidism of renal origin: Secondary | ICD-10-CM | POA: Diagnosis not present

## 2018-02-16 DIAGNOSIS — D631 Anemia in chronic kidney disease: Secondary | ICD-10-CM | POA: Diagnosis not present

## 2018-02-16 DIAGNOSIS — N186 End stage renal disease: Secondary | ICD-10-CM | POA: Diagnosis not present

## 2018-02-18 DIAGNOSIS — D631 Anemia in chronic kidney disease: Secondary | ICD-10-CM | POA: Diagnosis not present

## 2018-02-18 DIAGNOSIS — N186 End stage renal disease: Secondary | ICD-10-CM | POA: Diagnosis not present

## 2018-02-18 DIAGNOSIS — N2581 Secondary hyperparathyroidism of renal origin: Secondary | ICD-10-CM | POA: Diagnosis not present

## 2018-02-21 DIAGNOSIS — N2581 Secondary hyperparathyroidism of renal origin: Secondary | ICD-10-CM | POA: Diagnosis not present

## 2018-02-21 DIAGNOSIS — N186 End stage renal disease: Secondary | ICD-10-CM | POA: Diagnosis not present

## 2018-02-21 DIAGNOSIS — D631 Anemia in chronic kidney disease: Secondary | ICD-10-CM | POA: Diagnosis not present

## 2018-02-23 DIAGNOSIS — N186 End stage renal disease: Secondary | ICD-10-CM | POA: Diagnosis not present

## 2018-02-23 DIAGNOSIS — D631 Anemia in chronic kidney disease: Secondary | ICD-10-CM | POA: Diagnosis not present

## 2018-02-23 DIAGNOSIS — N2581 Secondary hyperparathyroidism of renal origin: Secondary | ICD-10-CM | POA: Diagnosis not present

## 2018-02-25 DIAGNOSIS — D631 Anemia in chronic kidney disease: Secondary | ICD-10-CM | POA: Diagnosis not present

## 2018-02-25 DIAGNOSIS — N186 End stage renal disease: Secondary | ICD-10-CM | POA: Diagnosis not present

## 2018-02-25 DIAGNOSIS — N2581 Secondary hyperparathyroidism of renal origin: Secondary | ICD-10-CM | POA: Diagnosis not present

## 2018-02-28 DIAGNOSIS — D631 Anemia in chronic kidney disease: Secondary | ICD-10-CM | POA: Diagnosis not present

## 2018-02-28 DIAGNOSIS — N2581 Secondary hyperparathyroidism of renal origin: Secondary | ICD-10-CM | POA: Diagnosis not present

## 2018-02-28 DIAGNOSIS — N186 End stage renal disease: Secondary | ICD-10-CM | POA: Diagnosis not present

## 2018-03-02 DIAGNOSIS — N186 End stage renal disease: Secondary | ICD-10-CM | POA: Diagnosis not present

## 2018-03-02 DIAGNOSIS — D631 Anemia in chronic kidney disease: Secondary | ICD-10-CM | POA: Diagnosis not present

## 2018-03-02 DIAGNOSIS — N2581 Secondary hyperparathyroidism of renal origin: Secondary | ICD-10-CM | POA: Diagnosis not present

## 2018-03-04 DIAGNOSIS — N186 End stage renal disease: Secondary | ICD-10-CM | POA: Diagnosis not present

## 2018-03-04 DIAGNOSIS — D631 Anemia in chronic kidney disease: Secondary | ICD-10-CM | POA: Diagnosis not present

## 2018-03-04 DIAGNOSIS — N2581 Secondary hyperparathyroidism of renal origin: Secondary | ICD-10-CM | POA: Diagnosis not present

## 2018-03-07 DIAGNOSIS — N2581 Secondary hyperparathyroidism of renal origin: Secondary | ICD-10-CM | POA: Diagnosis not present

## 2018-03-07 DIAGNOSIS — D631 Anemia in chronic kidney disease: Secondary | ICD-10-CM | POA: Diagnosis not present

## 2018-03-07 DIAGNOSIS — N186 End stage renal disease: Secondary | ICD-10-CM | POA: Diagnosis not present

## 2018-03-09 DIAGNOSIS — N2581 Secondary hyperparathyroidism of renal origin: Secondary | ICD-10-CM | POA: Diagnosis not present

## 2018-03-09 DIAGNOSIS — N186 End stage renal disease: Secondary | ICD-10-CM | POA: Diagnosis not present

## 2018-03-09 DIAGNOSIS — D631 Anemia in chronic kidney disease: Secondary | ICD-10-CM | POA: Diagnosis not present

## 2018-03-11 DIAGNOSIS — N2581 Secondary hyperparathyroidism of renal origin: Secondary | ICD-10-CM | POA: Diagnosis not present

## 2018-03-11 DIAGNOSIS — D631 Anemia in chronic kidney disease: Secondary | ICD-10-CM | POA: Diagnosis not present

## 2018-03-11 DIAGNOSIS — N186 End stage renal disease: Secondary | ICD-10-CM | POA: Diagnosis not present

## 2018-03-14 DIAGNOSIS — N2581 Secondary hyperparathyroidism of renal origin: Secondary | ICD-10-CM | POA: Diagnosis not present

## 2018-03-14 DIAGNOSIS — N186 End stage renal disease: Secondary | ICD-10-CM | POA: Diagnosis not present

## 2018-03-14 DIAGNOSIS — D631 Anemia in chronic kidney disease: Secondary | ICD-10-CM | POA: Diagnosis not present

## 2018-03-16 DIAGNOSIS — D631 Anemia in chronic kidney disease: Secondary | ICD-10-CM | POA: Diagnosis not present

## 2018-03-16 DIAGNOSIS — N186 End stage renal disease: Secondary | ICD-10-CM | POA: Diagnosis not present

## 2018-03-16 DIAGNOSIS — N2581 Secondary hyperparathyroidism of renal origin: Secondary | ICD-10-CM | POA: Diagnosis not present

## 2018-03-18 DIAGNOSIS — N186 End stage renal disease: Secondary | ICD-10-CM | POA: Diagnosis not present

## 2018-03-18 DIAGNOSIS — N2581 Secondary hyperparathyroidism of renal origin: Secondary | ICD-10-CM | POA: Diagnosis not present

## 2018-03-21 DIAGNOSIS — N186 End stage renal disease: Secondary | ICD-10-CM | POA: Diagnosis not present

## 2018-03-21 DIAGNOSIS — N2581 Secondary hyperparathyroidism of renal origin: Secondary | ICD-10-CM | POA: Diagnosis not present

## 2018-03-22 DIAGNOSIS — N184 Chronic kidney disease, stage 4 (severe): Secondary | ICD-10-CM | POA: Diagnosis not present

## 2018-03-22 DIAGNOSIS — Z452 Encounter for adjustment and management of vascular access device: Secondary | ICD-10-CM | POA: Diagnosis not present

## 2018-03-22 DIAGNOSIS — E873 Alkalosis: Secondary | ICD-10-CM | POA: Diagnosis not present

## 2018-03-22 DIAGNOSIS — J984 Other disorders of lung: Secondary | ICD-10-CM | POA: Diagnosis not present

## 2018-03-22 DIAGNOSIS — Z5181 Encounter for therapeutic drug level monitoring: Secondary | ICD-10-CM | POA: Diagnosis not present

## 2018-03-22 DIAGNOSIS — Z940 Kidney transplant status: Secondary | ICD-10-CM | POA: Diagnosis not present

## 2018-03-22 DIAGNOSIS — I491 Atrial premature depolarization: Secondary | ICD-10-CM | POA: Diagnosis not present

## 2018-03-22 DIAGNOSIS — M3214 Glomerular disease in systemic lupus erythematosus: Secondary | ICD-10-CM | POA: Diagnosis not present

## 2018-03-22 DIAGNOSIS — I10 Essential (primary) hypertension: Secondary | ICD-10-CM | POA: Diagnosis not present

## 2018-03-22 DIAGNOSIS — Z7982 Long term (current) use of aspirin: Secondary | ICD-10-CM | POA: Diagnosis not present

## 2018-03-22 DIAGNOSIS — Z833 Family history of diabetes mellitus: Secondary | ICD-10-CM | POA: Diagnosis not present

## 2018-03-22 DIAGNOSIS — Z823 Family history of stroke: Secondary | ICD-10-CM | POA: Diagnosis not present

## 2018-03-22 DIAGNOSIS — Z992 Dependence on renal dialysis: Secondary | ICD-10-CM | POA: Diagnosis not present

## 2018-03-22 DIAGNOSIS — E119 Type 2 diabetes mellitus without complications: Secondary | ICD-10-CM | POA: Diagnosis not present

## 2018-03-22 DIAGNOSIS — E871 Hypo-osmolality and hyponatremia: Secondary | ICD-10-CM | POA: Diagnosis not present

## 2018-03-22 DIAGNOSIS — Z01818 Encounter for other preprocedural examination: Secondary | ICD-10-CM | POA: Diagnosis not present

## 2018-03-22 DIAGNOSIS — D649 Anemia, unspecified: Secondary | ICD-10-CM | POA: Diagnosis not present

## 2018-03-22 DIAGNOSIS — N186 End stage renal disease: Secondary | ICD-10-CM | POA: Diagnosis not present

## 2018-03-22 DIAGNOSIS — I120 Hypertensive chronic kidney disease with stage 5 chronic kidney disease or end stage renal disease: Secondary | ICD-10-CM | POA: Diagnosis not present

## 2018-03-22 DIAGNOSIS — Z8673 Personal history of transient ischemic attack (TIA), and cerebral infarction without residual deficits: Secondary | ICD-10-CM | POA: Diagnosis not present

## 2018-03-22 DIAGNOSIS — Z79899 Other long term (current) drug therapy: Secondary | ICD-10-CM | POA: Diagnosis not present

## 2018-03-22 DIAGNOSIS — Z86718 Personal history of other venous thrombosis and embolism: Secondary | ICD-10-CM | POA: Diagnosis not present

## 2018-03-22 DIAGNOSIS — I129 Hypertensive chronic kidney disease with stage 1 through stage 4 chronic kidney disease, or unspecified chronic kidney disease: Secondary | ICD-10-CM | POA: Diagnosis not present

## 2018-03-22 DIAGNOSIS — M329 Systemic lupus erythematosus, unspecified: Secondary | ICD-10-CM | POA: Diagnosis not present

## 2018-03-22 DIAGNOSIS — I517 Cardiomegaly: Secondary | ICD-10-CM | POA: Diagnosis not present

## 2018-03-22 DIAGNOSIS — D631 Anemia in chronic kidney disease: Secondary | ICD-10-CM | POA: Diagnosis not present

## 2018-03-22 DIAGNOSIS — N269 Renal sclerosis, unspecified: Secondary | ICD-10-CM | POA: Diagnosis present

## 2018-03-22 DIAGNOSIS — D62 Acute posthemorrhagic anemia: Secondary | ICD-10-CM | POA: Diagnosis not present

## 2018-03-22 DIAGNOSIS — R9431 Abnormal electrocardiogram [ECG] [EKG]: Secondary | ICD-10-CM | POA: Diagnosis not present

## 2018-03-22 DIAGNOSIS — Z9889 Other specified postprocedural states: Secondary | ICD-10-CM | POA: Diagnosis not present

## 2018-03-22 DIAGNOSIS — Z9981 Dependence on supplemental oxygen: Secondary | ICD-10-CM | POA: Diagnosis not present

## 2018-03-22 DIAGNOSIS — Z4822 Encounter for aftercare following kidney transplant: Secondary | ICD-10-CM | POA: Diagnosis not present

## 2018-03-22 DIAGNOSIS — R112 Nausea with vomiting, unspecified: Secondary | ICD-10-CM | POA: Diagnosis not present

## 2018-03-23 ENCOUNTER — Telehealth: Payer: Self-pay

## 2018-03-23 NOTE — Telephone Encounter (Signed)
Copied from [place]. Topic: Referral - Request for Referral >> Mar 23, 2018 11:01 AM Scherrie Gerlach wrote: Pt is in [facility] and had a successful kidney transplant.  Daughter is calling to see if she can get extended hours for home health care when pt gets discharged,  however, Dr [name] did not order the home health first.  Daughter states she thinks the Education officer, museum ordered. Pt already has [place] coming in, but when she gets home, she will need more care. I advised daughter I think she would need to be seen within a certain amount of time in order for Dr [name] to place those orders, but I would send a message and look into that for her. She is also going to ask the hospital social worker and see if they are going to order the home health when she leave. Pt has not see Dr [name] since 11/07/17.

## 2018-03-24 NOTE — Telephone Encounter (Signed)
Left message to return call to clinic. 

## 2018-03-24 NOTE — Telephone Encounter (Signed)
Message sent to Dr. Jordan for review and approval. 

## 2018-03-24 NOTE — Telephone Encounter (Signed)
She can request an increase of hours from current [facility] provider, if they feel it is appropriate they can send me orders to be signed. Thanks, BJ

## 2018-03-27 DIAGNOSIS — Z940 Kidney transplant status: Secondary | ICD-10-CM

## 2018-03-29 DIAGNOSIS — I151 Hypertension secondary to other renal disorders: Secondary | ICD-10-CM | POA: Diagnosis not present

## 2018-03-29 DIAGNOSIS — Z940 Kidney transplant status: Secondary | ICD-10-CM | POA: Diagnosis not present

## 2018-03-29 DIAGNOSIS — D649 Anemia, unspecified: Secondary | ICD-10-CM | POA: Diagnosis not present

## 2018-03-29 DIAGNOSIS — I10 Essential (primary) hypertension: Secondary | ICD-10-CM | POA: Diagnosis not present

## 2018-03-29 DIAGNOSIS — Z79899 Other long term (current) drug therapy: Secondary | ICD-10-CM | POA: Diagnosis not present

## 2018-03-29 DIAGNOSIS — N2889 Other specified disorders of kidney and ureter: Secondary | ICD-10-CM | POA: Diagnosis not present

## 2018-03-29 DIAGNOSIS — N051 Unspecified nephritic syndrome with focal and segmental glomerular lesions: Secondary | ICD-10-CM | POA: Diagnosis not present

## 2018-03-29 DIAGNOSIS — Z792 Long term (current) use of antibiotics: Secondary | ICD-10-CM | POA: Diagnosis not present

## 2018-03-29 DIAGNOSIS — D899 Disorder involving the immune mechanism, unspecified: Secondary | ICD-10-CM | POA: Diagnosis not present

## 2018-03-29 DIAGNOSIS — M329 Systemic lupus erythematosus, unspecified: Secondary | ICD-10-CM | POA: Diagnosis not present

## 2018-03-29 DIAGNOSIS — Z7952 Long term (current) use of systemic steroids: Secondary | ICD-10-CM | POA: Diagnosis not present

## 2018-03-29 DIAGNOSIS — Z8673 Personal history of transient ischemic attack (TIA), and cerebral infarction without residual deficits: Secondary | ICD-10-CM | POA: Diagnosis not present

## 2018-03-29 DIAGNOSIS — Z4822 Encounter for aftercare following kidney transplant: Secondary | ICD-10-CM | POA: Diagnosis not present

## 2018-03-29 DIAGNOSIS — Z5181 Encounter for therapeutic drug level monitoring: Secondary | ICD-10-CM | POA: Diagnosis not present

## 2018-03-29 NOTE — Telephone Encounter (Signed)
Spoke with patient's daughter, Vickii Chafe - gave recommendations per Dr [name].  She is going to reach out to [facility] and have them fax over an orders form to sign off.  I advised her that we do not have any form here to fax and have not received anything from them at this time.   Will send to [place] to keep an eye out.

## 2018-03-29 NOTE — Telephone Encounter (Signed)
Left detailed message with directions per Dr. [name] concerning [facility] hours. Patient's daughter advised to call office with any questions or concerns. Nothing further needed at this time.

## 2018-03-29 NOTE — Telephone Encounter (Signed)
Noted  

## 2018-03-31 DIAGNOSIS — D649 Anemia, unspecified: Secondary | ICD-10-CM | POA: Diagnosis not present

## 2018-03-31 DIAGNOSIS — M3214 Glomerular disease in systemic lupus erythematosus: Secondary | ICD-10-CM | POA: Diagnosis not present

## 2018-03-31 DIAGNOSIS — I10 Essential (primary) hypertension: Secondary | ICD-10-CM | POA: Diagnosis not present

## 2018-03-31 DIAGNOSIS — E869 Volume depletion, unspecified: Secondary | ICD-10-CM | POA: Diagnosis not present

## 2018-03-31 DIAGNOSIS — Z79899 Other long term (current) drug therapy: Secondary | ICD-10-CM | POA: Diagnosis not present

## 2018-03-31 DIAGNOSIS — Z940 Kidney transplant status: Secondary | ICD-10-CM | POA: Diagnosis not present

## 2018-03-31 DIAGNOSIS — M329 Systemic lupus erythematosus, unspecified: Secondary | ICD-10-CM | POA: Diagnosis not present

## 2018-03-31 DIAGNOSIS — N186 End stage renal disease: Secondary | ICD-10-CM | POA: Diagnosis not present

## 2018-03-31 DIAGNOSIS — Z792 Long term (current) use of antibiotics: Secondary | ICD-10-CM | POA: Diagnosis not present

## 2018-03-31 DIAGNOSIS — Z4822 Encounter for aftercare following kidney transplant: Secondary | ICD-10-CM | POA: Diagnosis not present

## 2018-03-31 DIAGNOSIS — Z7952 Long term (current) use of systemic steroids: Secondary | ICD-10-CM | POA: Diagnosis not present

## 2018-03-31 DIAGNOSIS — D631 Anemia in chronic kidney disease: Secondary | ICD-10-CM | POA: Diagnosis not present

## 2018-03-31 NOTE — Telephone Encounter (Signed)
Patient's daughter spoke with Ashtyn on 03/29/2018. When form is sent over from [facility], it will be completed and faxed. Nothing further needed at this time.

## 2018-04-03 DIAGNOSIS — M329 Systemic lupus erythematosus, unspecified: Secondary | ICD-10-CM | POA: Diagnosis not present

## 2018-04-03 DIAGNOSIS — Z4822 Encounter for aftercare following kidney transplant: Secondary | ICD-10-CM | POA: Diagnosis not present

## 2018-04-03 DIAGNOSIS — I151 Hypertension secondary to other renal disorders: Secondary | ICD-10-CM | POA: Diagnosis not present

## 2018-04-03 DIAGNOSIS — N2889 Other specified disorders of kidney and ureter: Secondary | ICD-10-CM | POA: Diagnosis not present

## 2018-04-05 DIAGNOSIS — Z7952 Long term (current) use of systemic steroids: Secondary | ICD-10-CM | POA: Diagnosis not present

## 2018-04-05 DIAGNOSIS — M329 Systemic lupus erythematosus, unspecified: Secondary | ICD-10-CM | POA: Diagnosis not present

## 2018-04-05 DIAGNOSIS — D8989 Other specified disorders involving the immune mechanism, not elsewhere classified: Secondary | ICD-10-CM | POA: Diagnosis not present

## 2018-04-05 DIAGNOSIS — R531 Weakness: Secondary | ICD-10-CM | POA: Diagnosis not present

## 2018-04-05 DIAGNOSIS — Z792 Long term (current) use of antibiotics: Secondary | ICD-10-CM | POA: Diagnosis not present

## 2018-04-05 DIAGNOSIS — E872 Acidosis: Secondary | ICD-10-CM | POA: Diagnosis not present

## 2018-04-05 DIAGNOSIS — R29898 Other symptoms and signs involving the musculoskeletal system: Secondary | ICD-10-CM | POA: Diagnosis not present

## 2018-04-05 DIAGNOSIS — I10 Essential (primary) hypertension: Secondary | ICD-10-CM | POA: Diagnosis not present

## 2018-04-05 DIAGNOSIS — Z79899 Other long term (current) drug therapy: Secondary | ICD-10-CM | POA: Diagnosis not present

## 2018-04-05 DIAGNOSIS — Z4822 Encounter for aftercare following kidney transplant: Secondary | ICD-10-CM | POA: Diagnosis not present

## 2018-04-05 DIAGNOSIS — Z8673 Personal history of transient ischemic attack (TIA), and cerebral infarction without residual deficits: Secondary | ICD-10-CM | POA: Diagnosis not present

## 2018-04-05 DIAGNOSIS — D649 Anemia, unspecified: Secondary | ICD-10-CM | POA: Diagnosis not present

## 2018-04-05 DIAGNOSIS — Z940 Kidney transplant status: Secondary | ICD-10-CM | POA: Diagnosis not present

## 2018-04-07 DIAGNOSIS — E872 Acidosis: Secondary | ICD-10-CM | POA: Diagnosis not present

## 2018-04-07 DIAGNOSIS — Z5181 Encounter for therapeutic drug level monitoring: Secondary | ICD-10-CM | POA: Diagnosis not present

## 2018-04-07 DIAGNOSIS — R112 Nausea with vomiting, unspecified: Secondary | ICD-10-CM | POA: Diagnosis not present

## 2018-04-07 DIAGNOSIS — I120 Hypertensive chronic kidney disease with stage 5 chronic kidney disease or end stage renal disease: Secondary | ICD-10-CM | POA: Diagnosis not present

## 2018-04-07 DIAGNOSIS — K828 Other specified diseases of gallbladder: Secondary | ICD-10-CM | POA: Diagnosis not present

## 2018-04-07 DIAGNOSIS — E875 Hyperkalemia: Secondary | ICD-10-CM | POA: Diagnosis not present

## 2018-04-07 DIAGNOSIS — E860 Dehydration: Secondary | ICD-10-CM | POA: Diagnosis not present

## 2018-04-07 DIAGNOSIS — Z79899 Other long term (current) drug therapy: Secondary | ICD-10-CM | POA: Diagnosis not present

## 2018-04-07 DIAGNOSIS — N186 End stage renal disease: Secondary | ICD-10-CM | POA: Diagnosis not present

## 2018-04-07 DIAGNOSIS — M3214 Glomerular disease in systemic lupus erythematosus: Secondary | ICD-10-CM | POA: Diagnosis not present

## 2018-04-07 DIAGNOSIS — R1011 Right upper quadrant pain: Secondary | ICD-10-CM | POA: Diagnosis not present

## 2018-04-07 DIAGNOSIS — Z940 Kidney transplant status: Secondary | ICD-10-CM | POA: Diagnosis not present

## 2018-04-07 DIAGNOSIS — R109 Unspecified abdominal pain: Secondary | ICD-10-CM | POA: Diagnosis not present

## 2018-04-07 DIAGNOSIS — Z4822 Encounter for aftercare following kidney transplant: Secondary | ICD-10-CM | POA: Diagnosis not present

## 2018-04-08 DIAGNOSIS — Z5181 Encounter for therapeutic drug level monitoring: Secondary | ICD-10-CM | POA: Diagnosis not present

## 2018-04-08 DIAGNOSIS — R1011 Right upper quadrant pain: Secondary | ICD-10-CM | POA: Diagnosis not present

## 2018-04-08 DIAGNOSIS — Z940 Kidney transplant status: Secondary | ICD-10-CM | POA: Diagnosis not present

## 2018-04-08 DIAGNOSIS — E872 Acidosis: Secondary | ICD-10-CM | POA: Diagnosis not present

## 2018-04-08 DIAGNOSIS — Z79899 Other long term (current) drug therapy: Secondary | ICD-10-CM | POA: Diagnosis not present

## 2018-04-08 DIAGNOSIS — N186 End stage renal disease: Secondary | ICD-10-CM | POA: Diagnosis not present

## 2018-04-08 DIAGNOSIS — R112 Nausea with vomiting, unspecified: Secondary | ICD-10-CM | POA: Diagnosis not present

## 2018-04-08 DIAGNOSIS — I120 Hypertensive chronic kidney disease with stage 5 chronic kidney disease or end stage renal disease: Secondary | ICD-10-CM | POA: Diagnosis not present

## 2018-04-08 DIAGNOSIS — E860 Dehydration: Secondary | ICD-10-CM | POA: Diagnosis not present

## 2018-04-08 DIAGNOSIS — Z4822 Encounter for aftercare following kidney transplant: Secondary | ICD-10-CM | POA: Diagnosis not present

## 2018-04-08 DIAGNOSIS — E875 Hyperkalemia: Secondary | ICD-10-CM | POA: Diagnosis not present

## 2018-04-08 DIAGNOSIS — M3214 Glomerular disease in systemic lupus erythematosus: Secondary | ICD-10-CM | POA: Diagnosis not present

## 2018-04-10 DIAGNOSIS — M3214 Glomerular disease in systemic lupus erythematosus: Secondary | ICD-10-CM | POA: Diagnosis not present

## 2018-04-10 DIAGNOSIS — R300 Dysuria: Secondary | ICD-10-CM | POA: Diagnosis not present

## 2018-04-10 DIAGNOSIS — R110 Nausea: Secondary | ICD-10-CM | POA: Diagnosis not present

## 2018-04-10 DIAGNOSIS — E872 Acidosis: Secondary | ICD-10-CM | POA: Diagnosis not present

## 2018-04-10 DIAGNOSIS — Z7952 Long term (current) use of systemic steroids: Secondary | ICD-10-CM | POA: Diagnosis not present

## 2018-04-10 DIAGNOSIS — L853 Xerosis cutis: Secondary | ICD-10-CM | POA: Diagnosis not present

## 2018-04-10 DIAGNOSIS — D649 Anemia, unspecified: Secondary | ICD-10-CM | POA: Diagnosis not present

## 2018-04-10 DIAGNOSIS — Z79899 Other long term (current) drug therapy: Secondary | ICD-10-CM | POA: Diagnosis not present

## 2018-04-10 DIAGNOSIS — I10 Essential (primary) hypertension: Secondary | ICD-10-CM | POA: Diagnosis not present

## 2018-04-10 DIAGNOSIS — Z4822 Encounter for aftercare following kidney transplant: Secondary | ICD-10-CM | POA: Diagnosis not present

## 2018-04-10 DIAGNOSIS — Z792 Long term (current) use of antibiotics: Secondary | ICD-10-CM | POA: Diagnosis not present

## 2018-04-12 DIAGNOSIS — D649 Anemia, unspecified: Secondary | ICD-10-CM | POA: Diagnosis not present

## 2018-04-12 DIAGNOSIS — I10 Essential (primary) hypertension: Secondary | ICD-10-CM | POA: Diagnosis not present

## 2018-04-12 DIAGNOSIS — R251 Tremor, unspecified: Secondary | ICD-10-CM | POA: Diagnosis not present

## 2018-04-12 DIAGNOSIS — E871 Hypo-osmolality and hyponatremia: Secondary | ICD-10-CM | POA: Diagnosis not present

## 2018-04-12 DIAGNOSIS — Z79899 Other long term (current) drug therapy: Secondary | ICD-10-CM | POA: Diagnosis not present

## 2018-04-12 DIAGNOSIS — M3214 Glomerular disease in systemic lupus erythematosus: Secondary | ICD-10-CM | POA: Diagnosis not present

## 2018-04-12 DIAGNOSIS — R110 Nausea: Secondary | ICD-10-CM | POA: Diagnosis not present

## 2018-04-12 DIAGNOSIS — Z4822 Encounter for aftercare following kidney transplant: Secondary | ICD-10-CM | POA: Diagnosis not present

## 2018-04-12 DIAGNOSIS — Z940 Kidney transplant status: Secondary | ICD-10-CM | POA: Diagnosis not present

## 2018-04-12 DIAGNOSIS — Z960 Presence of urogenital implants: Secondary | ICD-10-CM | POA: Diagnosis not present

## 2018-04-12 DIAGNOSIS — Z792 Long term (current) use of antibiotics: Secondary | ICD-10-CM | POA: Diagnosis not present

## 2018-04-12 DIAGNOSIS — R300 Dysuria: Secondary | ICD-10-CM | POA: Diagnosis not present

## 2018-04-12 DIAGNOSIS — M6281 Muscle weakness (generalized): Secondary | ICD-10-CM | POA: Diagnosis not present

## 2018-04-12 DIAGNOSIS — Z466 Encounter for fitting and adjustment of urinary device: Secondary | ICD-10-CM | POA: Diagnosis not present

## 2018-04-12 DIAGNOSIS — M329 Systemic lupus erythematosus, unspecified: Secondary | ICD-10-CM | POA: Diagnosis not present

## 2018-04-12 DIAGNOSIS — T451X5A Adverse effect of antineoplastic and immunosuppressive drugs, initial encounter: Secondary | ICD-10-CM | POA: Diagnosis not present

## 2018-04-12 DIAGNOSIS — D8989 Other specified disorders involving the immune mechanism, not elsewhere classified: Secondary | ICD-10-CM | POA: Diagnosis not present

## 2018-04-12 DIAGNOSIS — N2889 Other specified disorders of kidney and ureter: Secondary | ICD-10-CM | POA: Diagnosis not present

## 2018-04-12 DIAGNOSIS — R627 Adult failure to thrive: Secondary | ICD-10-CM | POA: Diagnosis not present

## 2018-04-12 DIAGNOSIS — R630 Anorexia: Secondary | ICD-10-CM | POA: Diagnosis not present

## 2018-04-12 DIAGNOSIS — G479 Sleep disorder, unspecified: Secondary | ICD-10-CM | POA: Diagnosis not present

## 2018-04-12 DIAGNOSIS — Z7952 Long term (current) use of systemic steroids: Secondary | ICD-10-CM | POA: Diagnosis not present

## 2018-04-13 DIAGNOSIS — Z940 Kidney transplant status: Secondary | ICD-10-CM | POA: Diagnosis not present

## 2018-04-13 DIAGNOSIS — R7989 Other specified abnormal findings of blood chemistry: Secondary | ICD-10-CM | POA: Diagnosis not present

## 2018-04-13 DIAGNOSIS — Z4822 Encounter for aftercare following kidney transplant: Secondary | ICD-10-CM | POA: Diagnosis not present

## 2018-04-14 DIAGNOSIS — Z940 Kidney transplant status: Secondary | ICD-10-CM | POA: Diagnosis not present

## 2018-04-14 DIAGNOSIS — R300 Dysuria: Secondary | ICD-10-CM | POA: Diagnosis not present

## 2018-04-14 DIAGNOSIS — Z7952 Long term (current) use of systemic steroids: Secondary | ICD-10-CM | POA: Diagnosis not present

## 2018-04-14 DIAGNOSIS — I959 Hypotension, unspecified: Secondary | ICD-10-CM | POA: Diagnosis not present

## 2018-04-14 DIAGNOSIS — R531 Weakness: Secondary | ICD-10-CM | POA: Diagnosis not present

## 2018-04-14 DIAGNOSIS — M3214 Glomerular disease in systemic lupus erythematosus: Secondary | ICD-10-CM | POA: Diagnosis not present

## 2018-04-14 DIAGNOSIS — Z792 Long term (current) use of antibiotics: Secondary | ICD-10-CM | POA: Diagnosis not present

## 2018-04-14 DIAGNOSIS — Z4822 Encounter for aftercare following kidney transplant: Secondary | ICD-10-CM | POA: Diagnosis not present

## 2018-04-14 DIAGNOSIS — Z8673 Personal history of transient ischemic attack (TIA), and cerebral infarction without residual deficits: Secondary | ICD-10-CM | POA: Diagnosis not present

## 2018-04-14 DIAGNOSIS — E871 Hypo-osmolality and hyponatremia: Secondary | ICD-10-CM | POA: Diagnosis not present

## 2018-04-14 DIAGNOSIS — D8989 Other specified disorders involving the immune mechanism, not elsewhere classified: Secondary | ICD-10-CM | POA: Diagnosis not present

## 2018-04-14 DIAGNOSIS — I951 Orthostatic hypotension: Secondary | ICD-10-CM | POA: Diagnosis not present

## 2018-04-14 DIAGNOSIS — D72819 Decreased white blood cell count, unspecified: Secondary | ICD-10-CM | POA: Diagnosis not present

## 2018-04-14 DIAGNOSIS — I10 Essential (primary) hypertension: Secondary | ICD-10-CM | POA: Diagnosis not present

## 2018-04-14 DIAGNOSIS — Z79899 Other long term (current) drug therapy: Secondary | ICD-10-CM | POA: Diagnosis not present

## 2018-04-14 DIAGNOSIS — M329 Systemic lupus erythematosus, unspecified: Secondary | ICD-10-CM | POA: Diagnosis not present

## 2018-04-17 DIAGNOSIS — G479 Sleep disorder, unspecified: Secondary | ICD-10-CM | POA: Diagnosis not present

## 2018-04-17 DIAGNOSIS — I10 Essential (primary) hypertension: Secondary | ICD-10-CM | POA: Diagnosis not present

## 2018-04-17 DIAGNOSIS — D649 Anemia, unspecified: Secondary | ICD-10-CM | POA: Diagnosis not present

## 2018-04-17 DIAGNOSIS — M3214 Glomerular disease in systemic lupus erythematosus: Secondary | ICD-10-CM | POA: Diagnosis not present

## 2018-04-17 DIAGNOSIS — R300 Dysuria: Secondary | ICD-10-CM | POA: Diagnosis not present

## 2018-04-17 DIAGNOSIS — I959 Hypotension, unspecified: Secondary | ICD-10-CM | POA: Diagnosis not present

## 2018-04-17 DIAGNOSIS — Z940 Kidney transplant status: Secondary | ICD-10-CM | POA: Diagnosis not present

## 2018-04-17 DIAGNOSIS — D72819 Decreased white blood cell count, unspecified: Secondary | ICD-10-CM | POA: Diagnosis not present

## 2018-04-17 DIAGNOSIS — Z4822 Encounter for aftercare following kidney transplant: Secondary | ICD-10-CM | POA: Diagnosis not present

## 2018-04-17 DIAGNOSIS — N2889 Other specified disorders of kidney and ureter: Secondary | ICD-10-CM | POA: Diagnosis not present

## 2018-04-17 DIAGNOSIS — R627 Adult failure to thrive: Secondary | ICD-10-CM | POA: Diagnosis not present

## 2018-04-17 DIAGNOSIS — Z79899 Other long term (current) drug therapy: Secondary | ICD-10-CM | POA: Diagnosis not present

## 2018-04-17 DIAGNOSIS — E871 Hypo-osmolality and hyponatremia: Secondary | ICD-10-CM | POA: Diagnosis not present

## 2018-04-17 DIAGNOSIS — M329 Systemic lupus erythematosus, unspecified: Secondary | ICD-10-CM | POA: Diagnosis not present

## 2018-04-17 DIAGNOSIS — R110 Nausea: Secondary | ICD-10-CM | POA: Diagnosis not present

## 2018-04-20 DIAGNOSIS — M329 Systemic lupus erythematosus, unspecified: Secondary | ICD-10-CM | POA: Diagnosis not present

## 2018-04-20 DIAGNOSIS — D8989 Other specified disorders involving the immune mechanism, not elsewhere classified: Secondary | ICD-10-CM | POA: Diagnosis not present

## 2018-04-20 DIAGNOSIS — G479 Sleep disorder, unspecified: Secondary | ICD-10-CM | POA: Diagnosis not present

## 2018-04-20 DIAGNOSIS — D72819 Decreased white blood cell count, unspecified: Secondary | ICD-10-CM | POA: Diagnosis not present

## 2018-04-20 DIAGNOSIS — R300 Dysuria: Secondary | ICD-10-CM | POA: Diagnosis not present

## 2018-04-20 DIAGNOSIS — Z8673 Personal history of transient ischemic attack (TIA), and cerebral infarction without residual deficits: Secondary | ICD-10-CM | POA: Diagnosis not present

## 2018-04-20 DIAGNOSIS — R600 Localized edema: Secondary | ICD-10-CM | POA: Diagnosis not present

## 2018-04-20 DIAGNOSIS — I10 Essential (primary) hypertension: Secondary | ICD-10-CM | POA: Diagnosis not present

## 2018-04-20 DIAGNOSIS — Z7952 Long term (current) use of systemic steroids: Secondary | ICD-10-CM | POA: Diagnosis not present

## 2018-04-20 DIAGNOSIS — G251 Drug-induced tremor: Secondary | ICD-10-CM | POA: Diagnosis not present

## 2018-04-20 DIAGNOSIS — I959 Hypotension, unspecified: Secondary | ICD-10-CM | POA: Diagnosis not present

## 2018-04-20 DIAGNOSIS — R200 Anesthesia of skin: Secondary | ICD-10-CM | POA: Diagnosis not present

## 2018-04-20 DIAGNOSIS — T50995D Adverse effect of other drugs, medicaments and biological substances, subsequent encounter: Secondary | ICD-10-CM | POA: Diagnosis not present

## 2018-04-20 DIAGNOSIS — E871 Hypo-osmolality and hyponatremia: Secondary | ICD-10-CM | POA: Diagnosis not present

## 2018-04-20 DIAGNOSIS — E860 Dehydration: Secondary | ICD-10-CM | POA: Diagnosis not present

## 2018-04-20 DIAGNOSIS — D649 Anemia, unspecified: Secondary | ICD-10-CM | POA: Diagnosis not present

## 2018-04-20 DIAGNOSIS — Z6827 Body mass index (BMI) 27.0-27.9, adult: Secondary | ICD-10-CM | POA: Diagnosis not present

## 2018-04-20 DIAGNOSIS — Z79899 Other long term (current) drug therapy: Secondary | ICD-10-CM | POA: Diagnosis not present

## 2018-04-20 DIAGNOSIS — Z4822 Encounter for aftercare following kidney transplant: Secondary | ICD-10-CM | POA: Diagnosis not present

## 2018-04-20 DIAGNOSIS — Z940 Kidney transplant status: Secondary | ICD-10-CM | POA: Diagnosis not present

## 2018-04-20 DIAGNOSIS — R630 Anorexia: Secondary | ICD-10-CM | POA: Diagnosis not present

## 2018-04-20 DIAGNOSIS — R627 Adult failure to thrive: Secondary | ICD-10-CM | POA: Diagnosis not present

## 2018-04-20 DIAGNOSIS — R531 Weakness: Secondary | ICD-10-CM | POA: Diagnosis not present

## 2018-04-27 DIAGNOSIS — M329 Systemic lupus erythematosus, unspecified: Secondary | ICD-10-CM | POA: Diagnosis not present

## 2018-04-27 DIAGNOSIS — E872 Acidosis: Secondary | ICD-10-CM | POA: Diagnosis not present

## 2018-04-27 DIAGNOSIS — M3214 Glomerular disease in systemic lupus erythematosus: Secondary | ICD-10-CM | POA: Diagnosis not present

## 2018-04-27 DIAGNOSIS — I10 Essential (primary) hypertension: Secondary | ICD-10-CM | POA: Diagnosis not present

## 2018-04-27 DIAGNOSIS — Z4822 Encounter for aftercare following kidney transplant: Secondary | ICD-10-CM | POA: Diagnosis not present

## 2018-04-27 DIAGNOSIS — Z79899 Other long term (current) drug therapy: Secondary | ICD-10-CM | POA: Diagnosis not present

## 2018-04-27 DIAGNOSIS — R627 Adult failure to thrive: Secondary | ICD-10-CM | POA: Diagnosis not present

## 2018-04-27 DIAGNOSIS — I951 Orthostatic hypotension: Secondary | ICD-10-CM | POA: Diagnosis not present

## 2018-04-27 DIAGNOSIS — E871 Hypo-osmolality and hyponatremia: Secondary | ICD-10-CM | POA: Diagnosis not present

## 2018-05-01 DIAGNOSIS — Z4822 Encounter for aftercare following kidney transplant: Secondary | ICD-10-CM | POA: Diagnosis not present

## 2018-05-01 DIAGNOSIS — D899 Disorder involving the immune mechanism, unspecified: Secondary | ICD-10-CM | POA: Diagnosis not present

## 2018-05-01 DIAGNOSIS — M329 Systemic lupus erythematosus, unspecified: Secondary | ICD-10-CM | POA: Diagnosis not present

## 2018-05-01 DIAGNOSIS — R630 Anorexia: Secondary | ICD-10-CM | POA: Diagnosis not present

## 2018-05-01 DIAGNOSIS — E869 Volume depletion, unspecified: Secondary | ICD-10-CM | POA: Diagnosis not present

## 2018-05-01 DIAGNOSIS — N269 Renal sclerosis, unspecified: Secondary | ICD-10-CM | POA: Diagnosis not present

## 2018-05-01 DIAGNOSIS — R627 Adult failure to thrive: Secondary | ICD-10-CM | POA: Diagnosis not present

## 2018-05-01 DIAGNOSIS — M3214 Glomerular disease in systemic lupus erythematosus: Secondary | ICD-10-CM | POA: Diagnosis not present

## 2018-05-01 DIAGNOSIS — N186 End stage renal disease: Secondary | ICD-10-CM | POA: Diagnosis not present

## 2018-05-01 DIAGNOSIS — Z940 Kidney transplant status: Secondary | ICD-10-CM | POA: Diagnosis not present

## 2018-05-01 DIAGNOSIS — I959 Hypotension, unspecified: Secondary | ICD-10-CM | POA: Diagnosis not present

## 2018-05-04 DIAGNOSIS — R509 Fever, unspecified: Secondary | ICD-10-CM | POA: Diagnosis not present

## 2018-05-04 DIAGNOSIS — R531 Weakness: Secondary | ICD-10-CM | POA: Diagnosis not present

## 2018-05-04 DIAGNOSIS — E869 Volume depletion, unspecified: Secondary | ICD-10-CM | POA: Diagnosis not present

## 2018-05-04 DIAGNOSIS — Z4822 Encounter for aftercare following kidney transplant: Secondary | ICD-10-CM | POA: Diagnosis not present

## 2018-05-04 DIAGNOSIS — R202 Paresthesia of skin: Secondary | ICD-10-CM | POA: Diagnosis not present

## 2018-05-04 DIAGNOSIS — D8989 Other specified disorders involving the immune mechanism, not elsewhere classified: Secondary | ICD-10-CM | POA: Diagnosis not present

## 2018-05-04 DIAGNOSIS — I10 Essential (primary) hypertension: Secondary | ICD-10-CM | POA: Diagnosis not present

## 2018-05-04 DIAGNOSIS — R627 Adult failure to thrive: Secondary | ICD-10-CM | POA: Diagnosis not present

## 2018-05-04 DIAGNOSIS — T8619 Other complication of kidney transplant: Secondary | ICD-10-CM | POA: Diagnosis not present

## 2018-05-04 DIAGNOSIS — Z7952 Long term (current) use of systemic steroids: Secondary | ICD-10-CM | POA: Diagnosis not present

## 2018-05-04 DIAGNOSIS — I951 Orthostatic hypotension: Secondary | ICD-10-CM | POA: Diagnosis not present

## 2018-05-04 DIAGNOSIS — Z7983 Long term (current) use of bisphosphonates: Secondary | ICD-10-CM | POA: Diagnosis not present

## 2018-05-04 DIAGNOSIS — Z79899 Other long term (current) drug therapy: Secondary | ICD-10-CM | POA: Diagnosis not present

## 2018-05-04 DIAGNOSIS — Z940 Kidney transplant status: Secondary | ICD-10-CM | POA: Diagnosis not present

## 2018-05-04 DIAGNOSIS — Z792 Long term (current) use of antibiotics: Secondary | ICD-10-CM | POA: Diagnosis not present

## 2018-05-11 DIAGNOSIS — E871 Hypo-osmolality and hyponatremia: Secondary | ICD-10-CM | POA: Diagnosis not present

## 2018-05-11 DIAGNOSIS — R0981 Nasal congestion: Secondary | ICD-10-CM | POA: Diagnosis not present

## 2018-05-11 DIAGNOSIS — Z4822 Encounter for aftercare following kidney transplant: Secondary | ICD-10-CM | POA: Diagnosis not present

## 2018-05-11 DIAGNOSIS — Z792 Long term (current) use of antibiotics: Secondary | ICD-10-CM | POA: Diagnosis not present

## 2018-05-11 DIAGNOSIS — D72819 Decreased white blood cell count, unspecified: Secondary | ICD-10-CM | POA: Diagnosis not present

## 2018-05-11 DIAGNOSIS — D649 Anemia, unspecified: Secondary | ICD-10-CM | POA: Diagnosis not present

## 2018-05-11 DIAGNOSIS — N2889 Other specified disorders of kidney and ureter: Secondary | ICD-10-CM | POA: Diagnosis not present

## 2018-05-11 DIAGNOSIS — Z940 Kidney transplant status: Secondary | ICD-10-CM | POA: Diagnosis not present

## 2018-05-11 DIAGNOSIS — G4700 Insomnia, unspecified: Secondary | ICD-10-CM | POA: Diagnosis not present

## 2018-05-11 DIAGNOSIS — I10 Essential (primary) hypertension: Secondary | ICD-10-CM | POA: Diagnosis not present

## 2018-05-11 DIAGNOSIS — R627 Adult failure to thrive: Secondary | ICD-10-CM | POA: Diagnosis not present

## 2018-05-11 DIAGNOSIS — R200 Anesthesia of skin: Secondary | ICD-10-CM | POA: Diagnosis not present

## 2018-05-11 DIAGNOSIS — Z7952 Long term (current) use of systemic steroids: Secondary | ICD-10-CM | POA: Diagnosis not present

## 2018-05-11 DIAGNOSIS — D8989 Other specified disorders involving the immune mechanism, not elsewhere classified: Secondary | ICD-10-CM | POA: Diagnosis not present

## 2018-05-11 DIAGNOSIS — R531 Weakness: Secondary | ICD-10-CM | POA: Diagnosis not present

## 2018-05-11 DIAGNOSIS — R609 Edema, unspecified: Secondary | ICD-10-CM | POA: Diagnosis not present

## 2018-05-11 DIAGNOSIS — M3214 Glomerular disease in systemic lupus erythematosus: Secondary | ICD-10-CM | POA: Diagnosis not present

## 2018-05-11 DIAGNOSIS — M329 Systemic lupus erythematosus, unspecified: Secondary | ICD-10-CM | POA: Diagnosis not present

## 2018-05-11 DIAGNOSIS — R509 Fever, unspecified: Secondary | ICD-10-CM | POA: Diagnosis not present

## 2018-05-11 DIAGNOSIS — M1990 Unspecified osteoarthritis, unspecified site: Secondary | ICD-10-CM | POA: Diagnosis not present

## 2018-05-11 DIAGNOSIS — Z79899 Other long term (current) drug therapy: Secondary | ICD-10-CM | POA: Diagnosis not present

## 2018-05-11 DIAGNOSIS — G479 Sleep disorder, unspecified: Secondary | ICD-10-CM | POA: Diagnosis not present

## 2018-05-12 ENCOUNTER — Other Ambulatory Visit (HOSPITAL_COMMUNITY): Payer: Medicare Other

## 2018-05-12 ENCOUNTER — Encounter: Payer: Medicare Other | Admitting: Vascular Surgery

## 2018-05-18 DIAGNOSIS — Z940 Kidney transplant status: Secondary | ICD-10-CM | POA: Diagnosis not present

## 2018-05-18 DIAGNOSIS — Z4822 Encounter for aftercare following kidney transplant: Secondary | ICD-10-CM | POA: Diagnosis not present

## 2018-05-24 DIAGNOSIS — I10 Essential (primary) hypertension: Secondary | ICD-10-CM | POA: Diagnosis not present

## 2018-05-24 DIAGNOSIS — R627 Adult failure to thrive: Secondary | ICD-10-CM | POA: Diagnosis not present

## 2018-05-24 DIAGNOSIS — E871 Hypo-osmolality and hyponatremia: Secondary | ICD-10-CM | POA: Diagnosis not present

## 2018-05-24 DIAGNOSIS — Z940 Kidney transplant status: Secondary | ICD-10-CM | POA: Diagnosis not present

## 2018-05-24 DIAGNOSIS — Z79899 Other long term (current) drug therapy: Secondary | ICD-10-CM | POA: Diagnosis not present

## 2018-05-24 DIAGNOSIS — I951 Orthostatic hypotension: Secondary | ICD-10-CM | POA: Diagnosis not present

## 2018-05-24 DIAGNOSIS — Z5181 Encounter for therapeutic drug level monitoring: Secondary | ICD-10-CM | POA: Diagnosis not present

## 2018-05-24 DIAGNOSIS — Z4822 Encounter for aftercare following kidney transplant: Secondary | ICD-10-CM | POA: Diagnosis not present

## 2018-05-25 DIAGNOSIS — Z5181 Encounter for therapeutic drug level monitoring: Secondary | ICD-10-CM | POA: Diagnosis not present

## 2018-05-25 DIAGNOSIS — Z79899 Other long term (current) drug therapy: Secondary | ICD-10-CM | POA: Diagnosis not present

## 2018-05-25 DIAGNOSIS — I10 Essential (primary) hypertension: Secondary | ICD-10-CM | POA: Diagnosis not present

## 2018-05-25 DIAGNOSIS — D8989 Other specified disorders involving the immune mechanism, not elsewhere classified: Secondary | ICD-10-CM | POA: Diagnosis not present

## 2018-05-25 DIAGNOSIS — Z940 Kidney transplant status: Secondary | ICD-10-CM | POA: Diagnosis not present

## 2018-05-25 DIAGNOSIS — Z4822 Encounter for aftercare following kidney transplant: Secondary | ICD-10-CM | POA: Diagnosis not present

## 2018-05-25 DIAGNOSIS — I951 Orthostatic hypotension: Secondary | ICD-10-CM | POA: Diagnosis not present

## 2018-05-25 DIAGNOSIS — R627 Adult failure to thrive: Secondary | ICD-10-CM | POA: Diagnosis not present

## 2018-05-25 DIAGNOSIS — E871 Hypo-osmolality and hyponatremia: Secondary | ICD-10-CM | POA: Diagnosis not present

## 2018-06-07 DIAGNOSIS — Z4822 Encounter for aftercare following kidney transplant: Secondary | ICD-10-CM | POA: Diagnosis not present

## 2018-06-07 DIAGNOSIS — Z940 Kidney transplant status: Secondary | ICD-10-CM | POA: Diagnosis not present

## 2018-06-08 DIAGNOSIS — R200 Anesthesia of skin: Secondary | ICD-10-CM | POA: Diagnosis not present

## 2018-06-08 DIAGNOSIS — M329 Systemic lupus erythematosus, unspecified: Secondary | ICD-10-CM | POA: Diagnosis not present

## 2018-06-08 DIAGNOSIS — Z79899 Other long term (current) drug therapy: Secondary | ICD-10-CM | POA: Diagnosis not present

## 2018-06-08 DIAGNOSIS — D649 Anemia, unspecified: Secondary | ICD-10-CM | POA: Diagnosis not present

## 2018-06-08 DIAGNOSIS — F419 Anxiety disorder, unspecified: Secondary | ICD-10-CM | POA: Diagnosis not present

## 2018-06-08 DIAGNOSIS — I10 Essential (primary) hypertension: Secondary | ICD-10-CM | POA: Diagnosis not present

## 2018-06-08 DIAGNOSIS — Z5181 Encounter for therapeutic drug level monitoring: Secondary | ICD-10-CM | POA: Diagnosis not present

## 2018-06-08 DIAGNOSIS — R0981 Nasal congestion: Secondary | ICD-10-CM | POA: Diagnosis not present

## 2018-06-08 DIAGNOSIS — G4700 Insomnia, unspecified: Secondary | ICD-10-CM | POA: Diagnosis not present

## 2018-06-08 DIAGNOSIS — F329 Major depressive disorder, single episode, unspecified: Secondary | ICD-10-CM | POA: Diagnosis not present

## 2018-06-08 DIAGNOSIS — R627 Adult failure to thrive: Secondary | ICD-10-CM | POA: Diagnosis not present

## 2018-06-08 DIAGNOSIS — R531 Weakness: Secondary | ICD-10-CM | POA: Diagnosis not present

## 2018-06-08 DIAGNOSIS — Z940 Kidney transplant status: Secondary | ICD-10-CM | POA: Diagnosis not present

## 2018-06-08 DIAGNOSIS — Z4822 Encounter for aftercare following kidney transplant: Secondary | ICD-10-CM | POA: Diagnosis not present

## 2018-06-08 DIAGNOSIS — Z7952 Long term (current) use of systemic steroids: Secondary | ICD-10-CM | POA: Diagnosis not present

## 2018-06-08 DIAGNOSIS — Z792 Long term (current) use of antibiotics: Secondary | ICD-10-CM | POA: Diagnosis not present

## 2018-06-08 DIAGNOSIS — I959 Hypotension, unspecified: Secondary | ICD-10-CM | POA: Diagnosis not present

## 2018-06-15 DIAGNOSIS — I951 Orthostatic hypotension: Secondary | ICD-10-CM | POA: Diagnosis not present

## 2018-06-15 DIAGNOSIS — Z79899 Other long term (current) drug therapy: Secondary | ICD-10-CM | POA: Diagnosis not present

## 2018-06-15 DIAGNOSIS — N186 End stage renal disease: Secondary | ICD-10-CM | POA: Diagnosis not present

## 2018-06-15 DIAGNOSIS — N200 Calculus of kidney: Secondary | ICD-10-CM | POA: Diagnosis not present

## 2018-06-15 DIAGNOSIS — Q6101 Congenital single renal cyst: Secondary | ICD-10-CM | POA: Diagnosis not present

## 2018-06-15 DIAGNOSIS — Z4822 Encounter for aftercare following kidney transplant: Secondary | ICD-10-CM | POA: Diagnosis not present

## 2018-06-15 DIAGNOSIS — Z992 Dependence on renal dialysis: Secondary | ICD-10-CM | POA: Diagnosis not present

## 2018-06-15 DIAGNOSIS — D8989 Other specified disorders involving the immune mechanism, not elsewhere classified: Secondary | ICD-10-CM | POA: Diagnosis not present

## 2018-06-15 DIAGNOSIS — M3214 Glomerular disease in systemic lupus erythematosus: Secondary | ICD-10-CM | POA: Diagnosis not present

## 2018-06-15 DIAGNOSIS — Z940 Kidney transplant status: Secondary | ICD-10-CM | POA: Diagnosis not present

## 2018-06-15 DIAGNOSIS — R627 Adult failure to thrive: Secondary | ICD-10-CM | POA: Diagnosis not present

## 2018-06-15 DIAGNOSIS — Z5181 Encounter for therapeutic drug level monitoring: Secondary | ICD-10-CM | POA: Diagnosis not present

## 2018-06-15 DIAGNOSIS — I10 Essential (primary) hypertension: Secondary | ICD-10-CM | POA: Diagnosis not present

## 2018-06-22 DIAGNOSIS — Z940 Kidney transplant status: Secondary | ICD-10-CM | POA: Diagnosis not present

## 2018-06-22 DIAGNOSIS — Z4822 Encounter for aftercare following kidney transplant: Secondary | ICD-10-CM | POA: Diagnosis not present

## 2018-06-29 DIAGNOSIS — D8989 Other specified disorders involving the immune mechanism, not elsewhere classified: Secondary | ICD-10-CM | POA: Diagnosis not present

## 2018-06-29 DIAGNOSIS — Z4822 Encounter for aftercare following kidney transplant: Secondary | ICD-10-CM | POA: Diagnosis not present

## 2018-06-29 DIAGNOSIS — Z5181 Encounter for therapeutic drug level monitoring: Secondary | ICD-10-CM | POA: Diagnosis not present

## 2018-06-29 DIAGNOSIS — I10 Essential (primary) hypertension: Secondary | ICD-10-CM | POA: Diagnosis not present

## 2018-06-29 DIAGNOSIS — Z79899 Other long term (current) drug therapy: Secondary | ICD-10-CM | POA: Diagnosis not present

## 2018-06-29 DIAGNOSIS — D708 Other neutropenia: Secondary | ICD-10-CM | POA: Diagnosis not present

## 2018-06-29 DIAGNOSIS — Z940 Kidney transplant status: Secondary | ICD-10-CM | POA: Diagnosis not present

## 2018-07-06 DIAGNOSIS — Z79899 Other long term (current) drug therapy: Secondary | ICD-10-CM | POA: Diagnosis not present

## 2018-07-06 DIAGNOSIS — Z940 Kidney transplant status: Secondary | ICD-10-CM | POA: Diagnosis not present

## 2018-07-06 DIAGNOSIS — Z4822 Encounter for aftercare following kidney transplant: Secondary | ICD-10-CM | POA: Diagnosis not present

## 2018-07-13 DIAGNOSIS — M778 Other enthesopathies, not elsewhere classified: Secondary | ICD-10-CM | POA: Diagnosis not present

## 2018-07-13 DIAGNOSIS — F418 Other specified anxiety disorders: Secondary | ICD-10-CM | POA: Diagnosis not present

## 2018-07-13 DIAGNOSIS — D649 Anemia, unspecified: Secondary | ICD-10-CM | POA: Diagnosis not present

## 2018-07-13 DIAGNOSIS — Z79899 Other long term (current) drug therapy: Secondary | ICD-10-CM | POA: Diagnosis not present

## 2018-07-13 DIAGNOSIS — R200 Anesthesia of skin: Secondary | ICD-10-CM | POA: Diagnosis not present

## 2018-07-13 DIAGNOSIS — D72819 Decreased white blood cell count, unspecified: Secondary | ICD-10-CM | POA: Diagnosis not present

## 2018-07-13 DIAGNOSIS — R531 Weakness: Secondary | ICD-10-CM | POA: Diagnosis not present

## 2018-07-13 DIAGNOSIS — I10 Essential (primary) hypertension: Secondary | ICD-10-CM | POA: Diagnosis not present

## 2018-07-13 DIAGNOSIS — Z4822 Encounter for aftercare following kidney transplant: Secondary | ICD-10-CM | POA: Diagnosis not present

## 2018-07-13 DIAGNOSIS — M329 Systemic lupus erythematosus, unspecified: Secondary | ICD-10-CM | POA: Diagnosis not present

## 2018-07-13 DIAGNOSIS — Z792 Long term (current) use of antibiotics: Secondary | ICD-10-CM | POA: Diagnosis not present

## 2018-07-13 DIAGNOSIS — Z7952 Long term (current) use of systemic steroids: Secondary | ICD-10-CM | POA: Diagnosis not present

## 2018-07-13 DIAGNOSIS — G479 Sleep disorder, unspecified: Secondary | ICD-10-CM | POA: Diagnosis not present

## 2018-07-27 DIAGNOSIS — Z4822 Encounter for aftercare following kidney transplant: Secondary | ICD-10-CM | POA: Diagnosis not present

## 2018-07-27 DIAGNOSIS — D72819 Decreased white blood cell count, unspecified: Secondary | ICD-10-CM | POA: Diagnosis not present

## 2018-07-27 DIAGNOSIS — I10 Essential (primary) hypertension: Secondary | ICD-10-CM | POA: Diagnosis not present

## 2018-07-27 DIAGNOSIS — Z5189 Encounter for other specified aftercare: Secondary | ICD-10-CM | POA: Diagnosis not present

## 2018-07-27 DIAGNOSIS — D8989 Other specified disorders involving the immune mechanism, not elsewhere classified: Secondary | ICD-10-CM | POA: Diagnosis not present

## 2018-07-27 DIAGNOSIS — Z79899 Other long term (current) drug therapy: Secondary | ICD-10-CM | POA: Diagnosis not present

## 2018-07-27 DIAGNOSIS — Z940 Kidney transplant status: Secondary | ICD-10-CM | POA: Diagnosis not present

## 2018-08-01 DIAGNOSIS — Z4822 Encounter for aftercare following kidney transplant: Secondary | ICD-10-CM | POA: Diagnosis not present

## 2018-08-01 DIAGNOSIS — Z5189 Encounter for other specified aftercare: Secondary | ICD-10-CM | POA: Diagnosis not present

## 2018-08-04 DIAGNOSIS — Z4822 Encounter for aftercare following kidney transplant: Secondary | ICD-10-CM | POA: Diagnosis not present

## 2018-08-10 DIAGNOSIS — D708 Other neutropenia: Secondary | ICD-10-CM | POA: Diagnosis not present

## 2018-08-10 DIAGNOSIS — E872 Acidosis: Secondary | ICD-10-CM | POA: Diagnosis not present

## 2018-08-10 DIAGNOSIS — M329 Systemic lupus erythematosus, unspecified: Secondary | ICD-10-CM | POA: Diagnosis not present

## 2018-08-10 DIAGNOSIS — Z4822 Encounter for aftercare following kidney transplant: Secondary | ICD-10-CM | POA: Diagnosis not present

## 2018-08-10 DIAGNOSIS — Z940 Kidney transplant status: Secondary | ICD-10-CM | POA: Diagnosis not present

## 2018-08-10 DIAGNOSIS — M79602 Pain in left arm: Secondary | ICD-10-CM | POA: Diagnosis not present

## 2018-08-10 DIAGNOSIS — I10 Essential (primary) hypertension: Secondary | ICD-10-CM | POA: Diagnosis not present

## 2018-08-10 DIAGNOSIS — M7989 Other specified soft tissue disorders: Secondary | ICD-10-CM | POA: Diagnosis not present

## 2018-08-24 DIAGNOSIS — Z940 Kidney transplant status: Secondary | ICD-10-CM | POA: Diagnosis not present

## 2018-08-24 DIAGNOSIS — D708 Other neutropenia: Secondary | ICD-10-CM | POA: Diagnosis not present

## 2018-08-24 DIAGNOSIS — M25519 Pain in unspecified shoulder: Secondary | ICD-10-CM | POA: Diagnosis not present

## 2018-08-24 DIAGNOSIS — D649 Anemia, unspecified: Secondary | ICD-10-CM | POA: Diagnosis not present

## 2018-08-24 DIAGNOSIS — Z4822 Encounter for aftercare following kidney transplant: Secondary | ICD-10-CM | POA: Diagnosis not present

## 2018-08-24 DIAGNOSIS — M7989 Other specified soft tissue disorders: Secondary | ICD-10-CM | POA: Diagnosis not present

## 2018-08-24 DIAGNOSIS — M79602 Pain in left arm: Secondary | ICD-10-CM | POA: Diagnosis not present

## 2018-08-24 DIAGNOSIS — I10 Essential (primary) hypertension: Secondary | ICD-10-CM | POA: Diagnosis not present

## 2018-08-24 DIAGNOSIS — Z7952 Long term (current) use of systemic steroids: Secondary | ICD-10-CM | POA: Diagnosis not present

## 2018-08-24 DIAGNOSIS — R531 Weakness: Secondary | ICD-10-CM | POA: Diagnosis not present

## 2018-08-24 DIAGNOSIS — Z79899 Other long term (current) drug therapy: Secondary | ICD-10-CM | POA: Diagnosis not present

## 2018-08-24 DIAGNOSIS — D899 Disorder involving the immune mechanism, unspecified: Secondary | ICD-10-CM | POA: Diagnosis not present

## 2018-08-24 DIAGNOSIS — M779 Enthesopathy, unspecified: Secondary | ICD-10-CM | POA: Diagnosis not present

## 2018-08-24 DIAGNOSIS — Z792 Long term (current) use of antibiotics: Secondary | ICD-10-CM | POA: Diagnosis not present

## 2018-08-24 DIAGNOSIS — M329 Systemic lupus erythematosus, unspecified: Secondary | ICD-10-CM | POA: Diagnosis not present

## 2018-08-24 DIAGNOSIS — G479 Sleep disorder, unspecified: Secondary | ICD-10-CM | POA: Diagnosis not present

## 2018-08-24 DIAGNOSIS — Z5181 Encounter for therapeutic drug level monitoring: Secondary | ICD-10-CM | POA: Diagnosis not present

## 2018-08-29 DIAGNOSIS — Z940 Kidney transplant status: Secondary | ICD-10-CM | POA: Diagnosis not present

## 2018-08-29 DIAGNOSIS — Z4822 Encounter for aftercare following kidney transplant: Secondary | ICD-10-CM | POA: Diagnosis not present

## 2018-08-30 DIAGNOSIS — Z5189 Encounter for other specified aftercare: Secondary | ICD-10-CM | POA: Diagnosis not present

## 2018-08-30 DIAGNOSIS — Z4822 Encounter for aftercare following kidney transplant: Secondary | ICD-10-CM | POA: Diagnosis not present

## 2018-09-04 DIAGNOSIS — D708 Other neutropenia: Secondary | ICD-10-CM | POA: Diagnosis present

## 2018-09-04 DIAGNOSIS — Z940 Kidney transplant status: Secondary | ICD-10-CM | POA: Diagnosis not present

## 2018-09-04 DIAGNOSIS — I10 Essential (primary) hypertension: Secondary | ICD-10-CM | POA: Diagnosis present

## 2018-09-04 DIAGNOSIS — I951 Orthostatic hypotension: Secondary | ICD-10-CM | POA: Diagnosis present

## 2018-09-04 DIAGNOSIS — Z8673 Personal history of transient ischemic attack (TIA), and cerebral infarction without residual deficits: Secondary | ICD-10-CM | POA: Diagnosis not present

## 2018-09-04 DIAGNOSIS — G43909 Migraine, unspecified, not intractable, without status migrainosus: Secondary | ICD-10-CM | POA: Diagnosis present

## 2018-09-04 DIAGNOSIS — M329 Systemic lupus erythematosus, unspecified: Secondary | ICD-10-CM | POA: Diagnosis present

## 2018-09-04 DIAGNOSIS — Z79899 Other long term (current) drug therapy: Secondary | ICD-10-CM | POA: Diagnosis not present

## 2018-09-04 DIAGNOSIS — R0981 Nasal congestion: Secondary | ICD-10-CM | POA: Diagnosis present

## 2018-09-04 DIAGNOSIS — D631 Anemia in chronic kidney disease: Secondary | ICD-10-CM | POA: Diagnosis present

## 2018-09-04 DIAGNOSIS — R5081 Fever presenting with conditions classified elsewhere: Secondary | ICD-10-CM | POA: Diagnosis not present

## 2018-09-04 DIAGNOSIS — Z7982 Long term (current) use of aspirin: Secondary | ICD-10-CM | POA: Diagnosis not present

## 2018-09-04 DIAGNOSIS — D709 Neutropenia, unspecified: Secondary | ICD-10-CM | POA: Diagnosis not present

## 2018-09-04 DIAGNOSIS — R502 Drug induced fever: Secondary | ICD-10-CM | POA: Diagnosis present

## 2018-09-04 DIAGNOSIS — Z5181 Encounter for therapeutic drug level monitoring: Secondary | ICD-10-CM | POA: Diagnosis not present

## 2018-09-04 DIAGNOSIS — Z4822 Encounter for aftercare following kidney transplant: Secondary | ICD-10-CM | POA: Diagnosis not present

## 2018-09-04 DIAGNOSIS — T50905A Adverse effect of unspecified drugs, medicaments and biological substances, initial encounter: Secondary | ICD-10-CM | POA: Diagnosis present

## 2018-09-04 DIAGNOSIS — Z20828 Contact with and (suspected) exposure to other viral communicable diseases: Secondary | ICD-10-CM | POA: Diagnosis present

## 2018-09-04 DIAGNOSIS — R509 Fever, unspecified: Secondary | ICD-10-CM | POA: Diagnosis not present

## 2018-09-08 DIAGNOSIS — D649 Anemia, unspecified: Secondary | ICD-10-CM | POA: Diagnosis not present

## 2018-09-08 DIAGNOSIS — Z792 Long term (current) use of antibiotics: Secondary | ICD-10-CM | POA: Diagnosis not present

## 2018-09-08 DIAGNOSIS — I10 Essential (primary) hypertension: Secondary | ICD-10-CM | POA: Diagnosis not present

## 2018-09-08 DIAGNOSIS — D72819 Decreased white blood cell count, unspecified: Secondary | ICD-10-CM | POA: Diagnosis not present

## 2018-09-08 DIAGNOSIS — Z4822 Encounter for aftercare following kidney transplant: Secondary | ICD-10-CM | POA: Diagnosis not present

## 2018-09-08 DIAGNOSIS — Z7952 Long term (current) use of systemic steroids: Secondary | ICD-10-CM | POA: Diagnosis not present

## 2018-09-08 DIAGNOSIS — R601 Generalized edema: Secondary | ICD-10-CM | POA: Diagnosis not present

## 2018-09-08 DIAGNOSIS — Z940 Kidney transplant status: Secondary | ICD-10-CM | POA: Diagnosis not present

## 2018-09-08 DIAGNOSIS — M329 Systemic lupus erythematosus, unspecified: Secondary | ICD-10-CM | POA: Diagnosis not present

## 2018-09-08 DIAGNOSIS — M25519 Pain in unspecified shoulder: Secondary | ICD-10-CM | POA: Diagnosis not present

## 2018-09-08 DIAGNOSIS — Z79899 Other long term (current) drug therapy: Secondary | ICD-10-CM | POA: Diagnosis not present

## 2018-09-08 DIAGNOSIS — G479 Sleep disorder, unspecified: Secondary | ICD-10-CM | POA: Diagnosis not present

## 2018-09-08 DIAGNOSIS — R29898 Other symptoms and signs involving the musculoskeletal system: Secondary | ICD-10-CM | POA: Diagnosis not present

## 2018-09-12 DIAGNOSIS — Z4822 Encounter for aftercare following kidney transplant: Secondary | ICD-10-CM | POA: Diagnosis not present

## 2018-09-13 DIAGNOSIS — Z4822 Encounter for aftercare following kidney transplant: Secondary | ICD-10-CM | POA: Diagnosis not present

## 2018-09-13 DIAGNOSIS — E872 Acidosis: Secondary | ICD-10-CM | POA: Diagnosis not present

## 2018-09-13 DIAGNOSIS — I10 Essential (primary) hypertension: Secondary | ICD-10-CM | POA: Diagnosis not present

## 2018-09-13 DIAGNOSIS — I151 Hypertension secondary to other renal disorders: Secondary | ICD-10-CM | POA: Diagnosis not present

## 2018-09-13 DIAGNOSIS — L0292 Furuncle, unspecified: Secondary | ICD-10-CM | POA: Diagnosis not present

## 2018-09-13 DIAGNOSIS — D649 Anemia, unspecified: Secondary | ICD-10-CM | POA: Diagnosis not present

## 2018-09-13 DIAGNOSIS — M25519 Pain in unspecified shoulder: Secondary | ICD-10-CM | POA: Diagnosis not present

## 2018-09-13 DIAGNOSIS — R627 Adult failure to thrive: Secondary | ICD-10-CM | POA: Diagnosis not present

## 2018-09-13 DIAGNOSIS — E871 Hypo-osmolality and hyponatremia: Secondary | ICD-10-CM | POA: Diagnosis not present

## 2018-09-13 DIAGNOSIS — Z940 Kidney transplant status: Secondary | ICD-10-CM | POA: Diagnosis not present

## 2018-09-13 DIAGNOSIS — Z7952 Long term (current) use of systemic steroids: Secondary | ICD-10-CM | POA: Diagnosis not present

## 2018-09-13 DIAGNOSIS — M329 Systemic lupus erythematosus, unspecified: Secondary | ICD-10-CM | POA: Diagnosis not present

## 2018-09-13 DIAGNOSIS — R29898 Other symptoms and signs involving the musculoskeletal system: Secondary | ICD-10-CM | POA: Diagnosis not present

## 2018-09-13 DIAGNOSIS — D899 Disorder involving the immune mechanism, unspecified: Secondary | ICD-10-CM | POA: Diagnosis not present

## 2018-09-13 DIAGNOSIS — Z5181 Encounter for therapeutic drug level monitoring: Secondary | ICD-10-CM | POA: Diagnosis not present

## 2018-09-13 DIAGNOSIS — Z79899 Other long term (current) drug therapy: Secondary | ICD-10-CM | POA: Diagnosis not present

## 2018-09-13 DIAGNOSIS — Z792 Long term (current) use of antibiotics: Secondary | ICD-10-CM | POA: Diagnosis not present

## 2018-09-19 DIAGNOSIS — Z5181 Encounter for therapeutic drug level monitoring: Secondary | ICD-10-CM | POA: Diagnosis not present

## 2018-09-19 DIAGNOSIS — Z4822 Encounter for aftercare following kidney transplant: Secondary | ICD-10-CM | POA: Diagnosis not present

## 2018-09-19 DIAGNOSIS — Z79899 Other long term (current) drug therapy: Secondary | ICD-10-CM | POA: Diagnosis not present

## 2018-09-19 DIAGNOSIS — L02214 Cutaneous abscess of groin: Secondary | ICD-10-CM | POA: Diagnosis not present

## 2018-09-19 DIAGNOSIS — L089 Local infection of the skin and subcutaneous tissue, unspecified: Secondary | ICD-10-CM | POA: Diagnosis not present

## 2018-09-19 DIAGNOSIS — Z792 Long term (current) use of antibiotics: Secondary | ICD-10-CM | POA: Diagnosis not present

## 2018-09-19 DIAGNOSIS — I151 Hypertension secondary to other renal disorders: Secondary | ICD-10-CM | POA: Diagnosis not present

## 2018-09-19 DIAGNOSIS — Z7952 Long term (current) use of systemic steroids: Secondary | ICD-10-CM | POA: Diagnosis not present

## 2018-09-19 DIAGNOSIS — Z940 Kidney transplant status: Secondary | ICD-10-CM | POA: Diagnosis not present

## 2018-09-19 DIAGNOSIS — S31104A Unspecified open wound of abdominal wall, left lower quadrant without penetration into peritoneal cavity, initial encounter: Secondary | ICD-10-CM | POA: Diagnosis not present

## 2018-09-19 DIAGNOSIS — N2889 Other specified disorders of kidney and ureter: Secondary | ICD-10-CM | POA: Diagnosis not present

## 2018-09-19 DIAGNOSIS — I10 Essential (primary) hypertension: Secondary | ICD-10-CM | POA: Diagnosis not present

## 2018-09-19 DIAGNOSIS — D708 Other neutropenia: Secondary | ICD-10-CM | POA: Diagnosis not present

## 2018-09-19 DIAGNOSIS — M779 Enthesopathy, unspecified: Secondary | ICD-10-CM | POA: Diagnosis not present

## 2018-09-19 DIAGNOSIS — G479 Sleep disorder, unspecified: Secondary | ICD-10-CM | POA: Diagnosis not present

## 2018-09-19 DIAGNOSIS — D649 Anemia, unspecified: Secondary | ICD-10-CM | POA: Diagnosis not present

## 2018-09-19 DIAGNOSIS — M3214 Glomerular disease in systemic lupus erythematosus: Secondary | ICD-10-CM | POA: Diagnosis not present

## 2018-09-19 DIAGNOSIS — I951 Orthostatic hypotension: Secondary | ICD-10-CM | POA: Diagnosis not present

## 2018-09-26 DIAGNOSIS — G479 Sleep disorder, unspecified: Secondary | ICD-10-CM | POA: Diagnosis not present

## 2018-09-26 DIAGNOSIS — Z9189 Other specified personal risk factors, not elsewhere classified: Secondary | ICD-10-CM | POA: Diagnosis not present

## 2018-09-26 DIAGNOSIS — R600 Localized edema: Secondary | ICD-10-CM | POA: Diagnosis not present

## 2018-09-26 DIAGNOSIS — D72819 Decreased white blood cell count, unspecified: Secondary | ICD-10-CM | POA: Diagnosis not present

## 2018-09-26 DIAGNOSIS — I10 Essential (primary) hypertension: Secondary | ICD-10-CM | POA: Diagnosis not present

## 2018-09-26 DIAGNOSIS — D649 Anemia, unspecified: Secondary | ICD-10-CM | POA: Diagnosis not present

## 2018-09-26 DIAGNOSIS — S31109D Unspecified open wound of abdominal wall, unspecified quadrant without penetration into peritoneal cavity, subsequent encounter: Secondary | ICD-10-CM | POA: Diagnosis not present

## 2018-09-26 DIAGNOSIS — Z7952 Long term (current) use of systemic steroids: Secondary | ICD-10-CM | POA: Diagnosis not present

## 2018-09-26 DIAGNOSIS — L089 Local infection of the skin and subcutaneous tissue, unspecified: Secondary | ICD-10-CM | POA: Diagnosis not present

## 2018-09-26 DIAGNOSIS — M79622 Pain in left upper arm: Secondary | ICD-10-CM | POA: Diagnosis not present

## 2018-09-26 DIAGNOSIS — Z4822 Encounter for aftercare following kidney transplant: Secondary | ICD-10-CM | POA: Diagnosis not present

## 2018-09-26 DIAGNOSIS — Z79899 Other long term (current) drug therapy: Secondary | ICD-10-CM | POA: Diagnosis not present

## 2018-09-26 DIAGNOSIS — Z940 Kidney transplant status: Secondary | ICD-10-CM | POA: Diagnosis not present

## 2018-09-26 DIAGNOSIS — M3214 Glomerular disease in systemic lupus erythematosus: Secondary | ICD-10-CM | POA: Diagnosis not present

## 2018-09-26 DIAGNOSIS — Z792 Long term (current) use of antibiotics: Secondary | ICD-10-CM | POA: Diagnosis not present

## 2018-10-07 DIAGNOSIS — R072 Precordial pain: Secondary | ICD-10-CM | POA: Diagnosis not present

## 2018-10-07 DIAGNOSIS — Z8673 Personal history of transient ischemic attack (TIA), and cerebral infarction without residual deficits: Secondary | ICD-10-CM | POA: Diagnosis not present

## 2018-10-07 DIAGNOSIS — I517 Cardiomegaly: Secondary | ICD-10-CM | POA: Diagnosis not present

## 2018-10-07 DIAGNOSIS — I213 ST elevation (STEMI) myocardial infarction of unspecified site: Secondary | ICD-10-CM | POA: Diagnosis not present

## 2018-10-07 DIAGNOSIS — R079 Chest pain, unspecified: Secondary | ICD-10-CM | POA: Diagnosis not present

## 2018-10-07 DIAGNOSIS — Z833 Family history of diabetes mellitus: Secondary | ICD-10-CM | POA: Diagnosis not present

## 2018-10-07 DIAGNOSIS — I160 Hypertensive urgency: Secondary | ICD-10-CM | POA: Diagnosis not present

## 2018-10-07 DIAGNOSIS — N186 End stage renal disease: Secondary | ICD-10-CM | POA: Diagnosis not present

## 2018-10-07 DIAGNOSIS — I10 Essential (primary) hypertension: Secondary | ICD-10-CM | POA: Diagnosis not present

## 2018-10-07 DIAGNOSIS — M329 Systemic lupus erythematosus, unspecified: Secondary | ICD-10-CM | POA: Diagnosis not present

## 2018-10-07 DIAGNOSIS — Z823 Family history of stroke: Secondary | ICD-10-CM | POA: Diagnosis not present

## 2018-10-07 DIAGNOSIS — R9431 Abnormal electrocardiogram [ECG] [EKG]: Secondary | ICD-10-CM | POA: Diagnosis not present

## 2018-10-07 DIAGNOSIS — R0789 Other chest pain: Secondary | ICD-10-CM | POA: Diagnosis not present

## 2018-10-07 DIAGNOSIS — M3214 Glomerular disease in systemic lupus erythematosus: Secondary | ICD-10-CM | POA: Diagnosis not present

## 2018-10-07 DIAGNOSIS — M5489 Other dorsalgia: Secondary | ICD-10-CM | POA: Diagnosis not present

## 2018-10-07 DIAGNOSIS — I358 Other nonrheumatic aortic valve disorders: Secondary | ICD-10-CM | POA: Diagnosis not present

## 2018-10-07 DIAGNOSIS — Z940 Kidney transplant status: Secondary | ICD-10-CM | POA: Diagnosis not present

## 2018-10-07 DIAGNOSIS — I499 Cardiac arrhythmia, unspecified: Secondary | ICD-10-CM | POA: Diagnosis not present

## 2018-10-07 DIAGNOSIS — N269 Renal sclerosis, unspecified: Secondary | ICD-10-CM | POA: Diagnosis not present

## 2018-10-07 DIAGNOSIS — I120 Hypertensive chronic kidney disease with stage 5 chronic kidney disease or end stage renal disease: Secondary | ICD-10-CM | POA: Diagnosis not present

## 2018-10-14 ENCOUNTER — Emergency Department (HOSPITAL_COMMUNITY)
Admission: EM | Admit: 2018-10-14 | Discharge: 2018-10-15 | Disposition: A | Discharge status: Home / Self Care | Payer: Medicare Other | Attending: Emergency Medicine | Admitting: Emergency Medicine

## 2018-10-14 ENCOUNTER — Other Ambulatory Visit: Payer: Self-pay

## 2018-10-14 ENCOUNTER — Emergency Department (HOSPITAL_COMMUNITY): Payer: Medicare Other

## 2018-10-14 ENCOUNTER — Encounter (HOSPITAL_COMMUNITY): Payer: Self-pay | Admitting: Emergency Medicine

## 2018-10-14 DIAGNOSIS — I120 Hypertensive chronic kidney disease with stage 5 chronic kidney disease or end stage renal disease: Secondary | ICD-10-CM | POA: Insufficient documentation

## 2018-10-14 DIAGNOSIS — Z8673 Personal history of transient ischemic attack (TIA), and cerebral infarction without residual deficits: Secondary | ICD-10-CM | POA: Diagnosis not present

## 2018-10-14 DIAGNOSIS — Z5320 Procedure and treatment not carried out because of patient's decision for unspecified reasons: Secondary | ICD-10-CM | POA: Insufficient documentation

## 2018-10-14 DIAGNOSIS — I249 Acute ischemic heart disease, unspecified: Secondary | ICD-10-CM | POA: Diagnosis not present

## 2018-10-14 DIAGNOSIS — Z992 Dependence on renal dialysis: Secondary | ICD-10-CM | POA: Insufficient documentation

## 2018-10-14 DIAGNOSIS — R079 Chest pain, unspecified: Secondary | ICD-10-CM | POA: Diagnosis not present

## 2018-10-14 DIAGNOSIS — Z79899 Other long term (current) drug therapy: Secondary | ICD-10-CM | POA: Diagnosis not present

## 2018-10-14 DIAGNOSIS — Z7982 Long term (current) use of aspirin: Secondary | ICD-10-CM | POA: Diagnosis not present

## 2018-10-14 DIAGNOSIS — N186 End stage renal disease: Secondary | ICD-10-CM | POA: Diagnosis not present

## 2018-10-14 LAB — CBC
HCT: 33.5 % — ABNORMAL LOW (ref 36.0–46.0)
Hemoglobin: 10.7 g/dL — ABNORMAL LOW (ref 12.0–15.0)
MCH: 30.5 pg (ref 26.0–34.0)
MCHC: 31.9 g/dL (ref 30.0–36.0)
MCV: 95.4 fL (ref 80.0–100.0)
Platelets: 247 10*3/uL (ref 150–400)
RBC: 3.51 MIL/uL — ABNORMAL LOW (ref 3.87–5.11)
RDW: 15.7 % — ABNORMAL HIGH (ref 11.5–15.5)
WBC: 6.6 10*3/uL (ref 4.0–10.5)
nRBC: 0 % (ref 0.0–0.2)

## 2018-10-14 MED ORDER — SODIUM CHLORIDE 0.9% FLUSH: 3.0000 mL | Freq: Once | INTRAVENOUS | Status: DC

## 2018-10-14 NOTE — ED Triage Notes (Signed)
Patient with indigestion and chest pain that started about one hour ago.  No shortness of breath at this time.  No nausea or vomiting.

## 2018-10-15 DIAGNOSIS — I249 Acute ischemic heart disease, unspecified: Secondary | ICD-10-CM | POA: Diagnosis not present

## 2018-10-15 LAB — BASIC METABOLIC PANEL
Anion gap: 11 (ref 5–15)
BUN: 31 mg/dL — ABNORMAL HIGH (ref 8–23)
CO2: 22 mmol/L (ref 22–32)
Calcium: 9.4 mg/dL (ref 8.9–10.3)
Chloride: 92 mmol/L — ABNORMAL LOW (ref 98–111)
Creatinine, Ser: 1.62 mg/dL — ABNORMAL HIGH (ref 0.44–1.00)
GFR calc Af Amer: 39 mL/min — ABNORMAL LOW (ref >60)
GFR calc non Af Amer: 33 mL/min — ABNORMAL LOW (ref >60)
Glucose, Bld: 127 mg/dL — ABNORMAL HIGH (ref 70–99)
Potassium: 4.5 mmol/L (ref 3.5–5.1)
Sodium: 125 mmol/L — ABNORMAL LOW (ref 135–145)

## 2018-10-15 LAB — TROPONIN I (HIGH SENSITIVITY)
Troponin I (High Sensitivity): 114 ng/L (ref <18)
Troponin I (High Sensitivity): 129 ng/L (ref <18)

## 2018-10-15 MED ORDER — ASPIRIN 81 MG PO CHEW
324.0000 mg | CHEWABLE_TABLET | Freq: Once | ORAL | Status: AC
  Administered 2018-10-15: 02:00:00 324 mg via ORAL
  Filled 2018-10-15: qty 4

## 2018-10-15 NOTE — ED Provider Notes (Signed)
[facility] EMERGENCY DEPARTMENT Provider Note   CSN: [id number] Arrival date & time: 10/14/18  2253     History   Chief Complaint Chief Complaint  Patient presents with  . Chest Pain    HPI Jenna [name] is a 63 y.o. female.     Patient presents to the emergency department for evaluation of chest pain.  Patient reports that she had onset of substernal chest pain around 11 PM tonight.  Pain did not radiate.  She did not have any associated nausea but did have some diaphoresis.  There was no associated shortness of breath.  Patient reports that she came to the ER directly.  Approximately 10 minutes after she arrived in ER, pain resolved.  Total time of pain was around 30 minutes.  She is currently back to normal without any complaints.     Past Medical History:  Diagnosis Date  . Anxiety    panic attack- talks herself and takes deep breathes  . Depression   . Dyspnea    with exertion   . ESRD (end stage renal disease) ([name])    hemo TTHSAT  . FSGS (focal segmental glomerulosclerosis) 2011   By renal biopsy  . Head injury    age 50  . History of kidney stones    kidney stone  . Hx of lupus nephritis 2011   by renal biopsy  . Lupus (systemic lupus erythematosus) ([name])    followed by Dr. Dierdre Forth  . Obesity   . Pericarditis    age 43ish  . Renal stone   . Secondary hyperparathyroidism of renal origin ([name])   . TIA (transient ischemic attack)    no residual effects    Patient Active Problem List   Diagnosis Date Noted  . Dyslipidemia 11/06/2017  . ESRD on dialysis ([name]) 06/27/2017  . CKD (chronic kidney disease) stage 5, GFR less than 15 ml/min ([name]) 02/15/2017  . Back pain at L4-L5 level 10/31/2014  . Muscle spasm of back 10/31/2014  . Visit for screening mammogram 09/30/2014  . Chronic maxillary sinusitis 07/04/2014  . Occipital headache 07/04/2014  . Increased urinary frequency 05/23/2014  . Falls 05/23/2014  . Prediabetes 05/23/2014   . Rash and nonspecific skin eruption 05/23/2014  . 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 Encounter for immunization 11/19/2013  . Lupus (systemic lupus erythematosus) ([name]) 09/24/2013  . CKD (chronic kidney disease) stage 4, GFR 15-29 ml/min ([name]) 07/02/2013  . TIA (transient ischemic attack) 05/05/2013  . Numbness and tingling of left side of face 05/04/2013  . Lupus ([name]) 04/15/2011  . 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  . 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    Past Surgical History:  Procedure Laterality Date  . AV FISTULA PLACEMENT Left 07/04/2017   Procedure: ARTERIOVENOUS (AV) FISTULA CREATION BRACHIOCEPHALIC;  Surgeon: Larina Earthly, MD;  Location: [facility] OR;  Service: Vascular;  Laterality: Left;  . COLONOSCOPY W/ POLYPECTOMY    . IR FLUORO GUIDE CV LINE RIGHT  06/28/2017  . IR US GUIDE VASC ACCESS RIGHT  06/28/2017  . REVISION OF ARTERIOVENOUS GORETEX GRAFT Left 11/30/2017   Procedure: REVISION OF ARTERIOVENOUS FISTULA LEFT ARM Superfistulization and branch ligation.;  Surgeon: Maeola Harman, MD;  Location: [facility] OR;  Service: Vascular;  Laterality: Left;     OB History    Gravida  2   Para  1   Term      Preterm      AB  1   Living  1     SAB  1   TAB      Ectopic      Multiple      Live Births               Home Medications    Prior to Admission medications   Medication Sig Start Date End Date Taking? Authorizing Provider  acetaminophen (TYLENOL) 500 MG tablet Take 1,000 mg by mouth every 6 (six) hours as needed for headache.    [provider]  aspirin 81 MG tablet Take 1 tablet (81 mg total) by mouth daily. Patient taking differently: Take 81 mg by mouth every 7 (seven) days.  12/25/13   Quentin Angst, MD  diclofenac sodium (VOLTAREN) 1 % GEL Apply 4 g topically 4 (four) times daily. Patient taking differently: Apply 4 g topically daily as needed (pain).  09/23/17   [place], Betty G, MD  fluticasone  (FLONASE) 50 MCG/ACT nasal spray Place 1 spray into both nostrils 2 (two) times daily. Patient taking differently: Place 1 spray into both nostrils daily as needed for allergies.  10/10/17   [place], Betty G, MD  HYDROcodone-acetaminophen (NORCO) 7.5-325 MG tablet Take 1 tablet by mouth every 6 (six) hours as needed for moderate pain or severe pain. 11/30/17   Rhyne, Ames Coupe, PA-C  hydroxychloroquine (PLAQUENIL) 200 MG tablet Take 1 tablet (200 mg total) by mouth 2 (two) times daily. 12/25/13   Quentin Angst, MD  lidocaine (LIDODERM) 5 % Place 1 patch onto the skin daily. Remove & Discard patch within 12 hours or as directed by MD 09/18/17   Petrucelli, Pleas Koch, PA-C  methocarbamol (ROBAXIN) 500 MG tablet Take 1 tablet (500 mg total) by mouth every 8 (eight) hours as needed. 09/18/17   Petrucelli, Samantha R, PA-C  multivitamin (RENA-VIT) TABS tablet Take 1 tablet by mouth daily.    [provider]    Family History Family History  Problem Relation Age of Onset  . Diabetes Mother   . Hypertension Father   . Cancer Sister   . Hypertension Brother     Social History Social History   Tobacco Use  . Smoking status: Never Smoker  . Smokeless tobacco: Never Used  Substance Use Topics  . Alcohol use: No  . Drug use: No     Allergies   Enalapril maleate, Penicillins, Chocolate, and Tape   Review of Systems Review of Systems  Constitutional: Positive for diaphoresis.  Respiratory: Negative for shortness of breath.   Cardiovascular: Positive for chest pain.  All other systems reviewed and are negative.    Physical Exam Updated Vital Signs BP (!) 173/90   Pulse 67   Temp 98.3 F (36.8 C) (Oral)   Resp 18   SpO2 99%   Physical Exam Vitals signs and nursing note reviewed.  Constitutional:      General: She is not in acute distress.    Appearance: Normal appearance. She is well-developed.  HENT:     Head: Normocephalic and atraumatic.     Right Ear:  Hearing normal.     Left Ear: Hearing normal.     Nose: Nose normal.  Eyes:     Conjunctiva/sclera: Conjunctivae normal.     Pupils: Pupils are equal, round, and reactive to light.  Neck:     Musculoskeletal: Normal range of motion and neck supple.  Cardiovascular:     Rate and Rhythm: Regular rhythm.     Heart sounds: S1 normal and S2  normal. No murmur. No friction rub. No gallop.   Pulmonary:     Effort: Pulmonary effort is normal. No respiratory distress.     Breath sounds: Normal breath sounds.  Chest:     Chest wall: No tenderness.  Abdominal:     General: Bowel sounds are normal.     Palpations: Abdomen is soft.     Tenderness: There is no abdominal tenderness. There is no guarding or rebound. Negative signs include Murphy's sign and McBurney's sign.     Hernia: No hernia is present.  Musculoskeletal: Normal range of motion.  Skin:    General: Skin is warm and dry.     Findings: No rash.  Neurological:     Mental Status: She is alert and oriented to person, place, and time.     GCS: GCS eye subscore is 4. GCS verbal subscore is 5. GCS motor subscore is 6.     Cranial Nerves: No cranial nerve deficit.     Sensory: No sensory deficit.     Coordination: Coordination normal.  Psychiatric:        Speech: Speech normal.        Behavior: Behavior normal.        Thought Content: Thought content normal.      ED Treatments / Results  Labs (all labs ordered are listed, but only abnormal results are displayed) Labs Reviewed  BASIC METABOLIC PANEL - Abnormal; Notable for the following components:      Result Value   Sodium 125 (*)    Chloride 92 (*)    Glucose, Bld 127 (*)    BUN 31 (*)    Creatinine, Ser 1.62 (*)    GFR calc non Af Amer 33 (*)    GFR calc Af Amer 39 (*)    All other components within normal limits  CBC - Abnormal; Notable for the following components:   RBC 3.51 (*)    Hemoglobin 10.7 (*)    HCT 33.5 (*)    RDW 15.7 (*)    All other components  within normal limits  TROPONIN I (HIGH SENSITIVITY) - Abnormal; Notable for the following components:   Troponin I (High Sensitivity) 114 (*)    All other components within normal limits  TROPONIN I (HIGH SENSITIVITY) - Abnormal; Notable for the following components:   Troponin I (High Sensitivity) 129 (*)    All other components within normal limits    EKG None  Radiology Dg Chest 2 View  Result Date: 10/14/2018 CLINICAL DATA:  63 year old female with chest pain. EXAM: CHEST - 2 VIEW COMPARISON:  Chest radiograph dated 09/18/2017 FINDINGS: Linear the right lung base atelectasis/scarring. There is no focal consolidation, pleural effusion, or pneumothorax. Stable mild cardiomegaly. No acute osseous pathology. IMPRESSION: No active cardiopulmonary disease. Electronically Signed   By: Anner Crete M.D.   On: 10/14/2018 23:37    Procedures Procedures (including critical care time)  Medications Ordered in ED Medications  sodium chloride flush (NS) 0.9 % injection 3 mL (has no administration in time range)  aspirin chewable tablet 324 mg (324 mg Oral Given 10/15/18 0206)     Initial Impression / Assessment and Plan / ED Course  I have reviewed the triage vital signs and the nursing notes.  Pertinent labs & imaging results that were available during my care of the patient were reviewed by me and considered in my medical decision making (see chart for details).        Patient presents to  the emergency department for evaluation of chest pain.  She had approximately 30 minutes of chest pain earlier tonight at rest.  Reviewing her records reveals that she was recently hospitalized at [facility] for chest pain and had elevated troponins.  During that hospital stay she had an echocardiogram but no stress test or cardiac catheterization.  She reports that they talked about doing cardiac catheterization but did not because of her renal transplant.  It was felt that her troponins were likely  secondary to demand from her accelerated hypertension.  She is not hypertensive here in the ER today.  Although her pain resolved spontaneously, first troponin was positive.  I recommended admission.  She declines.  A second troponin was ordered and was further elevated.  I once again discussed admission with her.  She was informed that she was likely having a heart attack and would require hospitalization for further evaluation, likely cardiac cath.  She feels that her symptoms are secondary to reflux.  I did tell her that if she is having a heart attack and leaves she could possibly die.  She understands this but tells me that she has a dog at home that she needs to take care of and would prefer to follow-up as an outpatient.  She is not altered in any way, has the capacity to decline this care plan.  I did extensively counsel her on the danger of leaving.  She understands she should return to the ER if her pain worsens at any time.  Final Clinical Impressions(s) / ED Diagnoses   Final diagnoses:  ACS (acute coronary syndrome) [facility])    ED Discharge Orders    None       Jazariah Teall, Gwenyth Allegra, MD 10/15/18 0300

## 2018-10-15 NOTE — Discharge Instructions (Addendum)
We think you are having a mild heart attack tonight.  It was recommended you stay in the hospital.  Please come back to the ER if your pain worsens at all.  Call your cardiologist Monday morning.

## 2018-10-17 DIAGNOSIS — Z940 Kidney transplant status: Secondary | ICD-10-CM | POA: Diagnosis not present

## 2018-10-17 DIAGNOSIS — Z20828 Contact with and (suspected) exposure to other viral communicable diseases: Secondary | ICD-10-CM | POA: Diagnosis present

## 2018-10-17 DIAGNOSIS — E873 Alkalosis: Secondary | ICD-10-CM | POA: Diagnosis not present

## 2018-10-17 DIAGNOSIS — D649 Anemia, unspecified: Secondary | ICD-10-CM | POA: Diagnosis not present

## 2018-10-17 DIAGNOSIS — T508X5A Adverse effect of diagnostic agents, initial encounter: Secondary | ICD-10-CM | POA: Diagnosis not present

## 2018-10-17 DIAGNOSIS — I4891 Unspecified atrial fibrillation: Secondary | ICD-10-CM | POA: Diagnosis not present

## 2018-10-17 DIAGNOSIS — M329 Systemic lupus erythematosus, unspecified: Secondary | ICD-10-CM | POA: Diagnosis present

## 2018-10-17 DIAGNOSIS — I480 Paroxysmal atrial fibrillation: Secondary | ICD-10-CM | POA: Diagnosis not present

## 2018-10-17 DIAGNOSIS — Z4822 Encounter for aftercare following kidney transplant: Secondary | ICD-10-CM | POA: Diagnosis not present

## 2018-10-17 DIAGNOSIS — I110 Hypertensive heart disease with heart failure: Secondary | ICD-10-CM | POA: Diagnosis not present

## 2018-10-17 DIAGNOSIS — I209 Angina pectoris, unspecified: Secondary | ICD-10-CM | POA: Diagnosis not present

## 2018-10-17 DIAGNOSIS — I25119 Atherosclerotic heart disease of native coronary artery with unspecified angina pectoris: Secondary | ICD-10-CM | POA: Diagnosis not present

## 2018-10-17 DIAGNOSIS — R0789 Other chest pain: Secondary | ICD-10-CM | POA: Diagnosis not present

## 2018-10-17 DIAGNOSIS — I132 Hypertensive heart and chronic kidney disease with heart failure and with stage 5 chronic kidney disease, or end stage renal disease: Secondary | ICD-10-CM | POA: Diagnosis not present

## 2018-10-17 DIAGNOSIS — R109 Unspecified abdominal pain: Secondary | ICD-10-CM | POA: Diagnosis not present

## 2018-10-17 DIAGNOSIS — Z955 Presence of coronary angioplasty implant and graft: Secondary | ICD-10-CM | POA: Diagnosis not present

## 2018-10-17 DIAGNOSIS — R079 Chest pain, unspecified: Secondary | ICD-10-CM | POA: Diagnosis not present

## 2018-10-17 DIAGNOSIS — Z79899 Other long term (current) drug therapy: Secondary | ICD-10-CM | POA: Diagnosis not present

## 2018-10-17 DIAGNOSIS — R000 Tachycardia, unspecified: Secondary | ICD-10-CM | POA: Diagnosis not present

## 2018-10-17 DIAGNOSIS — Z7982 Long term (current) use of aspirin: Secondary | ICD-10-CM | POA: Diagnosis not present

## 2018-10-17 DIAGNOSIS — I513 Intracardiac thrombosis, not elsewhere classified: Secondary | ICD-10-CM | POA: Diagnosis not present

## 2018-10-17 DIAGNOSIS — T8619 Other complication of kidney transplant: Secondary | ICD-10-CM | POA: Diagnosis not present

## 2018-10-17 DIAGNOSIS — E875 Hyperkalemia: Secondary | ICD-10-CM | POA: Diagnosis not present

## 2018-10-17 DIAGNOSIS — N189 Chronic kidney disease, unspecified: Secondary | ICD-10-CM | POA: Diagnosis not present

## 2018-10-17 DIAGNOSIS — I10 Essential (primary) hypertension: Secondary | ICD-10-CM | POA: Diagnosis not present

## 2018-10-17 DIAGNOSIS — M7981 Nontraumatic hematoma of soft tissue: Secondary | ICD-10-CM | POA: Diagnosis not present

## 2018-10-17 DIAGNOSIS — I081 Rheumatic disorders of both mitral and tricuspid valves: Secondary | ICD-10-CM | POA: Diagnosis not present

## 2018-10-17 DIAGNOSIS — Z5181 Encounter for therapeutic drug level monitoring: Secondary | ICD-10-CM | POA: Diagnosis not present

## 2018-10-17 DIAGNOSIS — I2102 ST elevation (STEMI) myocardial infarction involving left anterior descending coronary artery: Secondary | ICD-10-CM | POA: Diagnosis not present

## 2018-10-17 DIAGNOSIS — I213 ST elevation (STEMI) myocardial infarction of unspecified site: Secondary | ICD-10-CM | POA: Diagnosis not present

## 2018-10-17 DIAGNOSIS — Z7901 Long term (current) use of anticoagulants: Secondary | ICD-10-CM | POA: Diagnosis not present

## 2018-10-17 DIAGNOSIS — E876 Hypokalemia: Secondary | ICD-10-CM | POA: Diagnosis not present

## 2018-10-17 DIAGNOSIS — I700 Atherosclerosis of aorta: Secondary | ICD-10-CM | POA: Diagnosis not present

## 2018-10-17 DIAGNOSIS — Z949 Transplanted organ and tissue status, unspecified: Secondary | ICD-10-CM | POA: Diagnosis not present

## 2018-10-17 DIAGNOSIS — Z5309 Procedure and treatment not carried out because of other contraindication: Secondary | ICD-10-CM | POA: Diagnosis not present

## 2018-10-17 DIAGNOSIS — I5020 Unspecified systolic (congestive) heart failure: Secondary | ICD-10-CM | POA: Diagnosis not present

## 2018-10-17 DIAGNOSIS — Z833 Family history of diabetes mellitus: Secondary | ICD-10-CM | POA: Diagnosis not present

## 2018-10-17 DIAGNOSIS — I129 Hypertensive chronic kidney disease with stage 1 through stage 4 chronic kidney disease, or unspecified chronic kidney disease: Secondary | ICD-10-CM | POA: Diagnosis not present

## 2018-10-17 DIAGNOSIS — E871 Hypo-osmolality and hyponatremia: Secondary | ICD-10-CM | POA: Diagnosis not present

## 2018-10-17 DIAGNOSIS — I491 Atrial premature depolarization: Secondary | ICD-10-CM | POA: Diagnosis not present

## 2018-10-17 DIAGNOSIS — I2129 ST elevation (STEMI) myocardial infarction involving other sites: Secondary | ICD-10-CM | POA: Diagnosis not present

## 2018-10-17 DIAGNOSIS — I444 Left anterior fascicular block: Secondary | ICD-10-CM | POA: Diagnosis not present

## 2018-10-17 DIAGNOSIS — N179 Acute kidney failure, unspecified: Secondary | ICD-10-CM | POA: Diagnosis not present

## 2018-10-17 DIAGNOSIS — I499 Cardiac arrhythmia, unspecified: Secondary | ICD-10-CM | POA: Diagnosis not present

## 2018-10-17 DIAGNOSIS — N141 Nephropathy induced by other drugs, medicaments and biological substances: Secondary | ICD-10-CM | POA: Diagnosis not present

## 2018-10-17 DIAGNOSIS — I2109 ST elevation (STEMI) myocardial infarction involving other coronary artery of anterior wall: Secondary | ICD-10-CM | POA: Diagnosis not present

## 2018-10-17 DIAGNOSIS — I779 Disorder of arteries and arterioles, unspecified: Secondary | ICD-10-CM | POA: Diagnosis not present

## 2018-10-17 DIAGNOSIS — Z8673 Personal history of transient ischemic attack (TIA), and cerebral infarction without residual deficits: Secondary | ICD-10-CM | POA: Diagnosis not present

## 2018-10-17 DIAGNOSIS — I517 Cardiomegaly: Secondary | ICD-10-CM | POA: Diagnosis not present

## 2018-10-17 DIAGNOSIS — Z823 Family history of stroke: Secondary | ICD-10-CM | POA: Diagnosis not present

## 2018-10-17 HISTORY — DX: Intracardiac thrombosis, not elsewhere classified: I51.3

## 2018-10-24 ENCOUNTER — Encounter (HOSPITAL_COMMUNITY): Payer: Self-pay | Admitting: *Deleted

## 2018-10-24 MED ORDER — METHADOSE 5 MG PO TABS
4.00 | ORAL_TABLET | ORAL | Status: DC
Start: ? — End: 2018-10-24

## 2018-10-24 MED ORDER — TROJAN ULTRA TEXTURE LUBRICATE MISC
6.25 | Status: DC
Start: 2018-10-24 — End: 2018-10-24

## 2018-10-24 MED ORDER — AMIODARONE HCL IV
200.00 | INTRAVENOUS | Status: DC
Start: 2018-10-25 — End: 2018-10-24

## 2018-10-24 MED ORDER — AMMONUL 10-10 % IV SOLN
5.00 | INTRAVENOUS | Status: DC
Start: ? — End: 2018-10-24

## 2018-10-24 MED ORDER — SOBA TUSSIN MAX ST COUGH 15 MG/5ML OR SYRP
1.00 | ORAL_SOLUTION | ORAL | Status: DC
Start: 2018-11-01 — End: 2018-10-24

## 2018-10-24 MED ORDER — OLANZAPINE-FLUOXETINE HCL 6-50 MG PO CAPS
9.00 | ORAL_CAPSULE | ORAL | Status: DC
Start: 2018-10-31 — End: 2018-10-24

## 2018-10-24 MED ORDER — AVELOX 400 MG PO TABS
1.00 | ORAL_TABLET | ORAL | Status: DC
Start: 2018-11-01 — End: 2018-10-24

## 2018-10-24 MED ORDER — OXYCODONE HCL 5 MG PO TABS
5.00 | ORAL_TABLET | ORAL | Status: DC
Start: ? — End: 2018-10-24

## 2018-10-24 MED ORDER — THONZYLAMINE-CHLOPHEDIANOL
15.00 | Status: DC
Start: 2018-10-24 — End: 2018-10-24

## 2018-10-24 MED ORDER — SAV-ON ALLERGY 4 MG OR TABS
200.00 | ORAL_TABLET | ORAL | Status: DC
Start: 2018-11-01 — End: 2018-10-24

## 2018-10-24 MED ORDER — Medication
5.00 | Status: DC
Start: 2018-10-31 — End: 2018-10-24

## 2018-10-24 MED ORDER — HEPARIN SOD (PORCINE) IN D5W 100 UNIT/ML IV SOLN
40.00 | INTRAVENOUS | Status: DC
Start: ? — End: 2018-10-24

## 2018-10-24 MED ORDER — DAMOR DRESSING EX PADS
10.00 | MEDICATED_PAD | CUTANEOUS | Status: DC
Start: 2018-11-01 — End: 2018-10-24

## 2018-10-24 MED ORDER — ATROPINE SULFATE 0.5 MG/5ML IJ SOSY
0.40 | PREFILLED_SYRINGE | INTRAMUSCULAR | Status: DC
Start: ? — End: 2018-10-24

## 2018-10-24 MED ORDER — PHENYLEPH-POT GUAIACOLSULF
81.00 | Status: DC
Start: 2018-10-25 — End: 2018-10-24

## 2018-10-24 MED ORDER — BELLADONNA ALKALOIDS-OPIUM RE
10.00 | RECTAL | Status: DC
Start: 2018-11-01 — End: 2018-10-24

## 2018-10-24 MED ORDER — STRI-DEX MAXIMUM STRENGTH 2 % EX PADS
125.00 | MEDICATED_PAD | CUTANEOUS | Status: DC
Start: ? — End: 2018-10-24

## 2018-10-24 MED ORDER — HEPARIN SOD (PORCINE) IN D5W 100 UNIT/ML IV SOLN
5.00 | INTRAVENOUS | Status: DC
Start: ? — End: 2018-10-24

## 2018-10-24 MED ORDER — CROMOLYN SODIUM (ANTI-INFLAMMATORY AGENTS)
180.00 | Status: DC
Start: 2018-10-31 — End: 2018-10-24

## 2018-10-24 MED ORDER — Medication
1000.00 | Status: DC
Start: 2018-10-24 — End: 2018-10-24

## 2018-10-24 MED ORDER — EQL CHILDRENS PLUS 5-160-1 MG/5ML PO SUSP
0.50 | ORAL | Status: DC
Start: 2018-10-24 — End: 2018-10-24

## 2018-10-24 MED ORDER — IPECAC PO
80.00 | ORAL | Status: DC
Start: ? — End: 2018-10-24

## 2018-10-24 MED ORDER — CLOPIDOGREL BISULFATE 75 MG PO TABS
75.00 | ORAL_TABLET | ORAL | Status: DC
Start: 2018-11-01 — End: 2018-10-24

## 2018-10-24 MED ORDER — Medication
500.00 | Status: DC
Start: ? — End: 2018-10-24

## 2018-10-24 MED ORDER — METRISET BURETTE SET MISC
Status: DC
Start: ? — End: 2018-10-24

## 2018-10-24 NOTE — Progress Notes (Signed)
Received referral notification for this pt to participate in cardiac rehab from Dr. Barbaraann Boys at [place] with MI type II/ NSTEMI due to hypertensive demand ischemia. Able to view medical history in [place].  It does not appear that this pt has been discharged as of yet. Will go ahead and send MD referral form and request 12 lead EKG.  Able to review hospital course and eventfully follow up appt. Will have support staff verify insurance, send medicaid form and request 12 lead ekg. Once requested information received and pt has completed her follow up she can be scheduled. Cherre Huger, BSN Cardiac and Training and development officer

## 2018-11-01 ENCOUNTER — Telehealth (HOSPITAL_COMMUNITY): Payer: Self-pay

## 2018-11-01 NOTE — Telephone Encounter (Signed)
Pt insurance is active and benefits verified through Medicare A/B. Co-pay $0.00, DED $198.00/$198.00 met, out of pocket $0.00/$0.00 met, co-insurance 20%. No pre-authorization required. Passport, 11/01/2018 @ 4:04PM, QBH#[id number]  Pt insurance is active through Medicaid. DJM#[id number]  Will fax over Medicaid Reimbursement form to Dr. Ferdinand Lango  Will contact patient to see if she is interested in the Cardiac Rehab Program. If interested, patient will need to complete follow up appt. Once completed, patient will be contacted for scheduling upon review by the RN Navigator.

## 2018-11-01 NOTE — Telephone Encounter (Signed)
Called patient to see if she is interested in the Cardiac Rehab Program. Patient expressed interest. Explained scheduling process and went over insurance, patient verbalized understanding. Will contact patient for scheduling once f/u has been completed.  Pt stated she was released from the h/p on 10/31/2018.

## 2018-11-02 MED ORDER — APHTHASOL 5 % MT PSTE
PASTE | OROMUCOSAL | Status: DC
Start: ? — End: 2018-11-02

## 2018-11-02 MED ORDER — Medication
1000.00 | Status: DC
Start: 2018-11-01 — End: 2018-11-02

## 2018-11-02 MED ORDER — BENZOCAINE-MENTHOL 6-10 MG MT LOZG
1.00 | LOZENGE | OROMUCOSAL | Status: DC
Start: ? — End: 2018-11-02

## 2018-11-02 MED ORDER — OSCO ANTI-NAUSEA 1.87-1.87-21.5 OR SOLN
25.00 | ORAL | Status: DC
Start: ? — End: 2018-11-02

## 2018-11-02 MED ORDER — FUROSEMIDE 40 MG PO TABS
40.00 | ORAL_TABLET | ORAL | Status: DC
Start: 2018-11-01 — End: 2018-11-02

## 2018-11-02 MED ORDER — WEIGHT LOSS DAILY MULTI PO TABS
5.00 | ORAL_TABLET | ORAL | Status: DC
Start: 2018-10-31 — End: 2018-11-02

## 2018-11-02 MED ORDER — QUINERVA 260 MG PO TABS
650.00 | ORAL_TABLET | ORAL | Status: DC
Start: ? — End: 2018-11-02

## 2018-11-02 MED ORDER — EQL CHILDRENS PLUS 5-160-1 MG/5ML PO SUSP
0.50 | ORAL | Status: DC
Start: 2018-10-31 — End: 2018-11-02

## 2018-11-02 MED ORDER — METOPROLOL SUCCINATE ER 50 MG PO TB24
100.00 | ORAL_TABLET | ORAL | Status: DC
Start: 2018-10-31 — End: 2018-11-02

## 2018-11-03 DIAGNOSIS — I10 Essential (primary) hypertension: Secondary | ICD-10-CM | POA: Diagnosis not present

## 2018-11-03 DIAGNOSIS — Z5181 Encounter for therapeutic drug level monitoring: Secondary | ICD-10-CM | POA: Diagnosis not present

## 2018-11-03 DIAGNOSIS — M3214 Glomerular disease in systemic lupus erythematosus: Secondary | ICD-10-CM | POA: Diagnosis not present

## 2018-11-03 DIAGNOSIS — G479 Sleep disorder, unspecified: Secondary | ICD-10-CM | POA: Diagnosis not present

## 2018-11-03 DIAGNOSIS — Z4822 Encounter for aftercare following kidney transplant: Secondary | ICD-10-CM | POA: Diagnosis not present

## 2018-11-03 DIAGNOSIS — Z79899 Other long term (current) drug therapy: Secondary | ICD-10-CM | POA: Diagnosis not present

## 2018-11-03 DIAGNOSIS — Z792 Long term (current) use of antibiotics: Secondary | ICD-10-CM | POA: Diagnosis not present

## 2018-11-03 DIAGNOSIS — Z7952 Long term (current) use of systemic steroids: Secondary | ICD-10-CM | POA: Diagnosis not present

## 2018-11-06 DIAGNOSIS — I513 Intracardiac thrombosis, not elsewhere classified: Secondary | ICD-10-CM | POA: Diagnosis not present

## 2018-11-06 DIAGNOSIS — I2102 ST elevation (STEMI) myocardial infarction involving left anterior descending coronary artery: Secondary | ICD-10-CM | POA: Diagnosis not present

## 2018-11-06 DIAGNOSIS — I480 Paroxysmal atrial fibrillation: Secondary | ICD-10-CM | POA: Diagnosis not present

## 2018-11-07 ENCOUNTER — Telehealth (HOSPITAL_COMMUNITY): Payer: Self-pay

## 2018-11-07 NOTE — Telephone Encounter (Signed)
Called Dr. Ferdinand Lango office to get a f/u on the md order and 12 lead EKG that they suppose to fax over. Dr. Ferdinand Lango assistance will call back with update. Placed ppw in the waiting fax folder.

## 2018-11-10 DIAGNOSIS — Z4822 Encounter for aftercare following kidney transplant: Secondary | ICD-10-CM | POA: Diagnosis not present

## 2018-11-10 DIAGNOSIS — D899 Disorder involving the immune mechanism, unspecified: Secondary | ICD-10-CM | POA: Diagnosis not present

## 2018-11-10 DIAGNOSIS — I252 Old myocardial infarction: Secondary | ICD-10-CM | POA: Diagnosis not present

## 2018-11-10 DIAGNOSIS — Z23 Encounter for immunization: Secondary | ICD-10-CM | POA: Diagnosis not present

## 2018-11-10 DIAGNOSIS — Z792 Long term (current) use of antibiotics: Secondary | ICD-10-CM | POA: Diagnosis not present

## 2018-11-10 DIAGNOSIS — I4891 Unspecified atrial fibrillation: Secondary | ICD-10-CM | POA: Diagnosis not present

## 2018-11-10 DIAGNOSIS — Z5181 Encounter for therapeutic drug level monitoring: Secondary | ICD-10-CM | POA: Diagnosis not present

## 2018-11-10 DIAGNOSIS — Z940 Kidney transplant status: Secondary | ICD-10-CM | POA: Diagnosis not present

## 2018-11-10 DIAGNOSIS — Z79899 Other long term (current) drug therapy: Secondary | ICD-10-CM | POA: Diagnosis not present

## 2018-11-10 DIAGNOSIS — I151 Hypertension secondary to other renal disorders: Secondary | ICD-10-CM | POA: Diagnosis not present

## 2018-11-10 DIAGNOSIS — Z7952 Long term (current) use of systemic steroids: Secondary | ICD-10-CM | POA: Diagnosis not present

## 2018-11-10 DIAGNOSIS — N2889 Other specified disorders of kidney and ureter: Secondary | ICD-10-CM | POA: Diagnosis not present

## 2018-11-10 DIAGNOSIS — R0602 Shortness of breath: Secondary | ICD-10-CM | POA: Diagnosis not present

## 2018-11-10 DIAGNOSIS — Z955 Presence of coronary angioplasty implant and graft: Secondary | ICD-10-CM | POA: Diagnosis not present

## 2018-11-10 DIAGNOSIS — Z7901 Long term (current) use of anticoagulants: Secondary | ICD-10-CM | POA: Diagnosis not present

## 2018-11-10 DIAGNOSIS — Z7902 Long term (current) use of antithrombotics/antiplatelets: Secondary | ICD-10-CM | POA: Diagnosis not present

## 2018-11-17 DIAGNOSIS — Z4822 Encounter for aftercare following kidney transplant: Secondary | ICD-10-CM | POA: Diagnosis not present

## 2018-11-17 DIAGNOSIS — D849 Immunodeficiency, unspecified: Secondary | ICD-10-CM | POA: Diagnosis not present

## 2018-11-17 DIAGNOSIS — Z7902 Long term (current) use of antithrombotics/antiplatelets: Secondary | ICD-10-CM | POA: Diagnosis not present

## 2018-11-17 DIAGNOSIS — Z7952 Long term (current) use of systemic steroids: Secondary | ICD-10-CM | POA: Diagnosis not present

## 2018-11-17 DIAGNOSIS — Z792 Long term (current) use of antibiotics: Secondary | ICD-10-CM | POA: Diagnosis not present

## 2018-11-17 DIAGNOSIS — M3214 Glomerular disease in systemic lupus erythematosus: Secondary | ICD-10-CM | POA: Diagnosis not present

## 2018-11-17 DIAGNOSIS — R233 Spontaneous ecchymoses: Secondary | ICD-10-CM | POA: Diagnosis not present

## 2018-11-17 DIAGNOSIS — G479 Sleep disorder, unspecified: Secondary | ICD-10-CM | POA: Diagnosis not present

## 2018-11-17 DIAGNOSIS — I10 Essential (primary) hypertension: Secondary | ICD-10-CM | POA: Diagnosis not present

## 2018-11-17 DIAGNOSIS — Z5181 Encounter for therapeutic drug level monitoring: Secondary | ICD-10-CM | POA: Diagnosis not present

## 2018-11-17 DIAGNOSIS — I151 Hypertension secondary to other renal disorders: Secondary | ICD-10-CM | POA: Diagnosis not present

## 2018-11-17 DIAGNOSIS — Z79899 Other long term (current) drug therapy: Secondary | ICD-10-CM | POA: Diagnosis not present

## 2018-11-17 DIAGNOSIS — N2889 Other specified disorders of kidney and ureter: Secondary | ICD-10-CM | POA: Diagnosis not present

## 2018-11-17 DIAGNOSIS — I252 Old myocardial infarction: Secondary | ICD-10-CM | POA: Diagnosis not present

## 2018-11-17 DIAGNOSIS — Z940 Kidney transplant status: Secondary | ICD-10-CM | POA: Diagnosis not present

## 2018-11-17 DIAGNOSIS — Z7901 Long term (current) use of anticoagulants: Secondary | ICD-10-CM | POA: Diagnosis not present

## 2018-11-21 DIAGNOSIS — I2102 ST elevation (STEMI) myocardial infarction involving left anterior descending coronary artery: Secondary | ICD-10-CM | POA: Diagnosis not present

## 2018-11-24 ENCOUNTER — Encounter: Payer: Medicare Other | Admitting: Obstetrics & Gynecology

## 2018-11-24 DIAGNOSIS — M65352 Trigger finger, left little finger: Secondary | ICD-10-CM | POA: Diagnosis not present

## 2018-11-24 DIAGNOSIS — N2889 Other specified disorders of kidney and ureter: Secondary | ICD-10-CM | POA: Diagnosis not present

## 2018-11-24 DIAGNOSIS — Z940 Kidney transplant status: Secondary | ICD-10-CM | POA: Diagnosis not present

## 2018-11-24 DIAGNOSIS — Z7901 Long term (current) use of anticoagulants: Secondary | ICD-10-CM | POA: Diagnosis not present

## 2018-11-24 DIAGNOSIS — M7918 Myalgia, other site: Secondary | ICD-10-CM | POA: Diagnosis not present

## 2018-11-24 DIAGNOSIS — D849 Immunodeficiency, unspecified: Secondary | ICD-10-CM | POA: Diagnosis not present

## 2018-11-24 DIAGNOSIS — I10 Essential (primary) hypertension: Secondary | ICD-10-CM | POA: Diagnosis not present

## 2018-11-24 DIAGNOSIS — M329 Systemic lupus erythematosus, unspecified: Secondary | ICD-10-CM | POA: Diagnosis not present

## 2018-11-24 DIAGNOSIS — G479 Sleep disorder, unspecified: Secondary | ICD-10-CM | POA: Diagnosis not present

## 2018-11-24 DIAGNOSIS — I252 Old myocardial infarction: Secondary | ICD-10-CM | POA: Diagnosis not present

## 2018-11-24 DIAGNOSIS — Z79899 Other long term (current) drug therapy: Secondary | ICD-10-CM | POA: Diagnosis not present

## 2018-11-24 DIAGNOSIS — Z7902 Long term (current) use of antithrombotics/antiplatelets: Secondary | ICD-10-CM | POA: Diagnosis not present

## 2018-11-24 DIAGNOSIS — Z4822 Encounter for aftercare following kidney transplant: Secondary | ICD-10-CM | POA: Diagnosis not present

## 2018-11-24 DIAGNOSIS — I151 Hypertension secondary to other renal disorders: Secondary | ICD-10-CM | POA: Diagnosis not present

## 2018-11-24 DIAGNOSIS — Z7952 Long term (current) use of systemic steroids: Secondary | ICD-10-CM | POA: Diagnosis not present

## 2018-11-24 DIAGNOSIS — Z006 Encounter for examination for normal comparison and control in clinical research program: Secondary | ICD-10-CM | POA: Diagnosis not present

## 2018-12-01 DIAGNOSIS — Z79899 Other long term (current) drug therapy: Secondary | ICD-10-CM | POA: Diagnosis not present

## 2018-12-01 DIAGNOSIS — Z4822 Encounter for aftercare following kidney transplant: Secondary | ICD-10-CM | POA: Diagnosis not present

## 2018-12-01 DIAGNOSIS — N186 End stage renal disease: Secondary | ICD-10-CM | POA: Diagnosis not present

## 2018-12-01 DIAGNOSIS — M3214 Glomerular disease in systemic lupus erythematosus: Secondary | ICD-10-CM | POA: Diagnosis not present

## 2018-12-01 DIAGNOSIS — I151 Hypertension secondary to other renal disorders: Secondary | ICD-10-CM | POA: Diagnosis not present

## 2018-12-01 DIAGNOSIS — Z940 Kidney transplant status: Secondary | ICD-10-CM | POA: Diagnosis not present

## 2018-12-01 DIAGNOSIS — N2889 Other specified disorders of kidney and ureter: Secondary | ICD-10-CM | POA: Diagnosis not present

## 2018-12-01 DIAGNOSIS — N184 Chronic kidney disease, stage 4 (severe): Secondary | ICD-10-CM | POA: Diagnosis not present

## 2018-12-01 DIAGNOSIS — D849 Immunodeficiency, unspecified: Secondary | ICD-10-CM | POA: Diagnosis not present

## 2018-12-01 DIAGNOSIS — Z7901 Long term (current) use of anticoagulants: Secondary | ICD-10-CM | POA: Diagnosis not present

## 2018-12-06 ENCOUNTER — Telehealth: Payer: Self-pay

## 2018-12-06 NOTE — Telephone Encounter (Signed)
Scheduled for annual. Wanted to make you aware that since last visit she had  Kidney transplant and a Major heart attack. Has a small clot and they are trying to dissolve it. She is on 2 blood thinners, Toporol 100 mg and Elliquis.  She said that her physician told her not to go to dentist, colonoscopy or any procedure that would make her bleed. I assured her that you would not be going anything to make her bleed. She said last time she was here you removed cervical polyp. I assured her in light of this information no procedures will be performed. Told her to remind you at office visit.

## 2018-12-06 NOTE — Telephone Encounter (Signed)
Patient called in voice mail stating she was calling about the appointment made for her and medical issues she has. The only appointment I see scheduled for her by [name] is [facility] on 12/13/18.  I called and got her voice mail. Left message to call.

## 2018-12-08 ENCOUNTER — Telehealth (HOSPITAL_COMMUNITY): Payer: Self-pay

## 2018-12-08 NOTE — Telephone Encounter (Signed)
Returned pt phone call, LMTCB.

## 2018-12-12 ENCOUNTER — Other Ambulatory Visit: Payer: Self-pay

## 2018-12-13 ENCOUNTER — Encounter: Payer: Self-pay | Admitting: Obstetrics & Gynecology

## 2018-12-13 ENCOUNTER — Ambulatory Visit (INDEPENDENT_AMBULATORY_CARE_PROVIDER_SITE_OTHER): Payer: Medicare Other | Admitting: Obstetrics & Gynecology

## 2018-12-13 VITALS — BP 120/70 | Ht 65.0 in | Wt 201.0 lb

## 2018-12-13 DIAGNOSIS — E6609 Other obesity due to excess calories: Secondary | ICD-10-CM

## 2018-12-13 DIAGNOSIS — Z13820 Encounter for screening for osteoporosis: Secondary | ICD-10-CM

## 2018-12-13 DIAGNOSIS — Z01419 Encounter for gynecological examination (general) (routine) without abnormal findings: Secondary | ICD-10-CM

## 2018-12-13 DIAGNOSIS — Z6833 Body mass index (BMI) 33.0-33.9, adult: Secondary | ICD-10-CM

## 2018-12-13 DIAGNOSIS — Z780 Asymptomatic menopausal state: Secondary | ICD-10-CM

## 2018-12-13 NOTE — Progress Notes (Signed)
Jenna [name] 1955-11-18 [id number]   History:    63 y.o. G2P1A1L1  RP:  Established patient presenting for annual gyn exam   HPI: Postmenopause x 8 yrs, well on no HRT.  No PMB.  No pelvic pain.  Abstinent.  Breasts normal.  SLE.  Kidney transplant 03/22/2018.  Had an MI postop, in Cardio Nutrition/Fitness rehabilitation currently.  BMI 33.45.    Past medical history,surgical history, family history and social history were all reviewed and documented in the EPIC chart.  Gynecologic History No LMP recorded. Patient is postmenopausal. Contraception: abstinence and post menopausal status Last Pap: 10/2017. Results were: Negative Last mammogram: 11/2017. Results were: Negative Bone Density: 10/2000 Normal Colonoscopy: 2015  Obstetric History OB History  Gravida Para Term Preterm AB Living  $Remov'2 1     1 1  'SHSQjj$ SAB TAB Ectopic Multiple Live Births  1            # Outcome Date GA Lbr Len/2nd Weight Sex Delivery Anes PTL Lv  2 SAB           1 Para              ROS: A ROS was performed and pertinent positives and negatives are included in the history.  GENERAL: No fevers or chills. HEENT: No change in vision, no earache, sore throat or sinus congestion. NECK: No pain or stiffness. CARDIOVASCULAR: No chest pain or pressure. No palpitations. PULMONARY: No shortness of breath, cough or wheeze. GASTROINTESTINAL: No abdominal pain, nausea, vomiting or diarrhea, melena or bright red blood per rectum. GENITOURINARY: No urinary frequency, urgency, hesitancy or dysuria. MUSCULOSKELETAL: No joint or muscle pain, no back pain, no recent trauma. DERMATOLOGIC: No rash, no itching, no lesions. ENDOCRINE: No polyuria, polydipsia, no heat or cold intolerance. No recent change in weight. HEMATOLOGICAL: No anemia or easy bruising or bleeding. NEUROLOGIC: No headache, seizures, numbness, tingling or weakness. PSYCHIATRIC: No depression, no loss of interest in normal activity or change in sleep pattern.      Exam:   BP 120/70   Ht $R'5\' 5"'Of$  (1.651 m)   Wt 201 lb (91.2 kg)   BMI 33.45 kg/m   Body mass index is 33.45 kg/m.  General appearance : Well developed well nourished female. No acute distress HEENT: Eyes: no retinal hemorrhage or exudates,  Neck supple, trachea midline, no carotid bruits, no thyroidmegaly Lungs: Clear to auscultation, no rhonchi or wheezes, or rib retractions  Heart: Regular rate and rhythm, no murmurs or gallops Breast:Examined in sitting and supine position were symmetrical in appearance, no palpable masses or tenderness,  no skin retraction, no nipple inversion, no nipple discharge, no skin discoloration, no axillary or supraclavicular lymphadenopathy Abdomen: no palpable masses or tenderness, no rebound or guarding Extremities: no edema or skin discoloration or tenderness  Pelvic: Vulva: Normal             Vagina: No gross lesions or discharge  Cervix: No gross lesions or discharge  Uterus  AV, normal size, shape and consistency, non-tender and mobile  Adnexa  Without masses or tenderness  Anus: Normal   Assessment/Plan:  63 y.o. female for annual exam   1. Well female exam with routine gynecological exam Normal gynecologic exam in menopause.  Pap test - September 2019, no indication to repeat this year.  Breast exam normal.  Due for screening mammogram, will schedule.  Patient had a kidney transplant in February 2020.  MI postop.  Currently doing cardio rehabilitation with nutrition  and fitness program.  2. Postmenopause Well on no hormone replacement therapy.  No postmenopausal bleeding.    3. Screening for osteoporosis Bone density was normal in 2002.  Patient will schedule a repeat bone density here now. - DG Bone Density; Future  4. Class 1 obesity due to excess calories with serious comorbidity and body mass index (BMI) of 33.0 to 33.9 in adult Patient currently followed with Cardiolite dilatation with a nutrition and fitness program.  Recommend  progressive weight loss.  Other orders - apixaban (ELIQUIS) 5 MG TABS tablet; Take 5 mg by mouth 2 (two) times daily. - metoprolol succinate (TOPROL-XL) 50 MG 24 hr tablet; Take 50 mg by mouth daily. Take with or immediately following a meal. - predniSONE (DELTASONE) 2.5 MG tablet; Take 2.5 mg by mouth daily with breakfast. - rosuvastatin (CRESTOR) 20 MG tablet; Take 20 mg by mouth daily. - tacrolimus (PROGRAF) 0.5 MG capsule; Take 0.5 mg by mouth 2 (two) times daily.  Princess Bruins MD, 2:54 PM 12/13/2018

## 2018-12-14 ENCOUNTER — Encounter: Payer: Self-pay | Admitting: Obstetrics & Gynecology

## 2018-12-14 NOTE — Patient Instructions (Signed)
1. Well female exam with routine gynecological exam Normal gynecologic exam in menopause.  Pap test - September 2019, no indication to repeat this year.  Breast exam normal.  Due for screening mammogram, will schedule.  Patient had a kidney transplant in February 2020.  MI postop.  Currently doing cardio rehabilitation with nutrition and fitness program.  2. Postmenopause Well on no hormone replacement therapy.  No postmenopausal bleeding.    3. Screening for osteoporosis Bone density was normal in 2002.  Patient will schedule a repeat bone density here now. - DG Bone Density; Future  4. Class 1 obesity due to excess calories with serious comorbidity and body mass index (BMI) of 33.0 to 33.9 in adult Patient currently followed with Cardiolite dilatation with a nutrition and fitness program.  Recommend progressive weight loss.  Other orders - apixaban (ELIQUIS) 5 MG TABS tablet; Take 5 mg by mouth 2 (two) times daily. - metoprolol succinate (TOPROL-XL) 50 MG 24 hr tablet; Take 50 mg by mouth daily. Take with or immediately following a meal. - predniSONE (DELTASONE) 2.5 MG tablet; Take 2.5 mg by mouth daily with breakfast. - rosuvastatin (CRESTOR) 20 MG tablet; Take 20 mg by mouth daily. - tacrolimus (PROGRAF) 0.5 MG capsule; Take 0.5 mg by mouth 2 (two) times daily.  Jenna [name], it was a pleasure seeing you today!

## 2018-12-14 NOTE — Telephone Encounter (Signed)
Called and spoke with Whitney at Dr. Ferdinand Lango office, adv her that we did not recv'd all the requested information back (MD order and recent EKG/12 lead). Whitney stated she will send a note to the doctor to have info faxed over.   Also I did adv Whitney I would fax the request over again.  Place pt ppw back in blue fax awaiting folder.

## 2018-12-15 DIAGNOSIS — I2510 Atherosclerotic heart disease of native coronary artery without angina pectoris: Secondary | ICD-10-CM | POA: Diagnosis not present

## 2018-12-15 DIAGNOSIS — Z7901 Long term (current) use of anticoagulants: Secondary | ICD-10-CM | POA: Diagnosis not present

## 2018-12-15 DIAGNOSIS — Z940 Kidney transplant status: Secondary | ICD-10-CM | POA: Diagnosis not present

## 2018-12-15 DIAGNOSIS — I454 Nonspecific intraventricular block: Secondary | ICD-10-CM | POA: Diagnosis not present

## 2018-12-15 DIAGNOSIS — R9431 Abnormal electrocardiogram [ECG] [EKG]: Secondary | ICD-10-CM | POA: Diagnosis not present

## 2018-12-15 DIAGNOSIS — Z9861 Coronary angioplasty status: Secondary | ICD-10-CM | POA: Diagnosis not present

## 2018-12-15 DIAGNOSIS — I480 Paroxysmal atrial fibrillation: Secondary | ICD-10-CM | POA: Diagnosis not present

## 2018-12-15 DIAGNOSIS — I4510 Unspecified right bundle-branch block: Secondary | ICD-10-CM | POA: Diagnosis not present

## 2018-12-15 DIAGNOSIS — I513 Intracardiac thrombosis, not elsewhere classified: Secondary | ICD-10-CM | POA: Diagnosis not present

## 2018-12-15 DIAGNOSIS — N2889 Other specified disorders of kidney and ureter: Secondary | ICD-10-CM | POA: Diagnosis not present

## 2018-12-15 DIAGNOSIS — M329 Systemic lupus erythematosus, unspecified: Secondary | ICD-10-CM | POA: Diagnosis not present

## 2018-12-15 DIAGNOSIS — I444 Left anterior fascicular block: Secondary | ICD-10-CM | POA: Diagnosis not present

## 2018-12-15 DIAGNOSIS — Z79899 Other long term (current) drug therapy: Secondary | ICD-10-CM | POA: Diagnosis not present

## 2018-12-15 DIAGNOSIS — I151 Hypertension secondary to other renal disorders: Secondary | ICD-10-CM | POA: Diagnosis not present

## 2018-12-15 DIAGNOSIS — I459 Conduction disorder, unspecified: Secondary | ICD-10-CM | POA: Diagnosis not present

## 2018-12-27 ENCOUNTER — Other Ambulatory Visit: Payer: Self-pay | Admitting: Obstetrics & Gynecology

## 2018-12-27 ENCOUNTER — Other Ambulatory Visit: Payer: Self-pay

## 2018-12-27 DIAGNOSIS — Z1231 Encounter for screening mammogram for malignant neoplasm of breast: Secondary | ICD-10-CM

## 2018-12-28 ENCOUNTER — Other Ambulatory Visit: Payer: Self-pay | Admitting: Obstetrics & Gynecology

## 2018-12-28 ENCOUNTER — Ambulatory Visit (INDEPENDENT_AMBULATORY_CARE_PROVIDER_SITE_OTHER): Payer: Medicare Other

## 2018-12-28 DIAGNOSIS — M8589 Other specified disorders of bone density and structure, multiple sites: Secondary | ICD-10-CM | POA: Diagnosis not present

## 2018-12-28 DIAGNOSIS — Z780 Asymptomatic menopausal state: Secondary | ICD-10-CM

## 2018-12-28 DIAGNOSIS — Z13820 Encounter for screening for osteoporosis: Secondary | ICD-10-CM

## 2018-12-29 DIAGNOSIS — Z79899 Other long term (current) drug therapy: Secondary | ICD-10-CM | POA: Diagnosis not present

## 2018-12-29 DIAGNOSIS — D849 Immunodeficiency, unspecified: Secondary | ICD-10-CM | POA: Diagnosis not present

## 2018-12-29 DIAGNOSIS — Z940 Kidney transplant status: Secondary | ICD-10-CM | POA: Diagnosis not present

## 2018-12-29 DIAGNOSIS — I5020 Unspecified systolic (congestive) heart failure: Secondary | ICD-10-CM | POA: Diagnosis not present

## 2018-12-29 DIAGNOSIS — I480 Paroxysmal atrial fibrillation: Secondary | ICD-10-CM | POA: Diagnosis not present

## 2018-12-29 DIAGNOSIS — I10 Essential (primary) hypertension: Secondary | ICD-10-CM | POA: Diagnosis not present

## 2018-12-29 DIAGNOSIS — Z4822 Encounter for aftercare following kidney transplant: Secondary | ICD-10-CM | POA: Diagnosis not present

## 2018-12-29 DIAGNOSIS — J0100 Acute maxillary sinusitis, unspecified: Secondary | ICD-10-CM | POA: Diagnosis not present

## 2019-01-01 DIAGNOSIS — I2102 ST elevation (STEMI) myocardial infarction involving left anterior descending coronary artery: Secondary | ICD-10-CM | POA: Diagnosis not present

## 2019-01-03 DIAGNOSIS — I2102 ST elevation (STEMI) myocardial infarction involving left anterior descending coronary artery: Secondary | ICD-10-CM | POA: Diagnosis not present

## 2019-01-04 ENCOUNTER — Other Ambulatory Visit: Payer: Self-pay

## 2019-01-04 DIAGNOSIS — I2102 ST elevation (STEMI) myocardial infarction involving left anterior descending coronary artery: Secondary | ICD-10-CM | POA: Diagnosis not present

## 2019-01-05 ENCOUNTER — Encounter: Payer: Self-pay | Admitting: Family Medicine

## 2019-01-05 ENCOUNTER — Ambulatory Visit (INDEPENDENT_AMBULATORY_CARE_PROVIDER_SITE_OTHER): Payer: Medicare Other | Admitting: Family Medicine

## 2019-01-05 VITALS — BP 120/64 | HR 67 | Temp 97.2°F | Resp 16 | Ht 65.0 in | Wt 199.6 lb

## 2019-01-05 DIAGNOSIS — E785 Hyperlipidemia, unspecified: Secondary | ICD-10-CM | POA: Diagnosis not present

## 2019-01-05 DIAGNOSIS — Z0000 Encounter for general adult medical examination without abnormal findings: Secondary | ICD-10-CM

## 2019-01-05 DIAGNOSIS — Z992 Dependence on renal dialysis: Secondary | ICD-10-CM | POA: Diagnosis not present

## 2019-01-05 DIAGNOSIS — N186 End stage renal disease: Secondary | ICD-10-CM | POA: Diagnosis not present

## 2019-01-05 DIAGNOSIS — I129 Hypertensive chronic kidney disease with stage 1 through stage 4 chronic kidney disease, or unspecified chronic kidney disease: Secondary | ICD-10-CM

## 2019-01-05 DIAGNOSIS — M329 Systemic lupus erythematosus, unspecified: Secondary | ICD-10-CM

## 2019-01-05 DIAGNOSIS — D619 Aplastic anemia, unspecified: Secondary | ICD-10-CM | POA: Insufficient documentation

## 2019-01-05 MED ORDER — LIDOCAINE 5 % EX PTCH: 1.0000 | MEDICATED_PATCH | CUTANEOUS | 6 refills | Status: DC

## 2019-01-05 NOTE — Patient Instructions (Signed)
  Jenna [name] , Thank you for taking time to come for your Medicare Wellness Visit. I appreciate your ongoing commitment to your health goals. Please review the following plan we discussed and let me know if I can assist you in the future.   These are the goals we discussed: Goals   None     This is a list of the screening recommended for you and due dates:  Health Maintenance  Topic Date Due  . Flu Shot  09/16/2018  . Mammogram  11/29/2019  . Pap Smear  11/07/2020  . Colon Cancer Screening  07/15/2022  . Tetanus Vaccine  09/24/2027  .  Hepatitis C: One time screening is recommended by Center for Disease Control  (CDC) for  adults born from [birth year] through 1965.   Completed  . HIV Screening  Completed  . Urine Protein Check  Discontinued    A few tips:  -As we age balance is not as good as it was, so there is a higher risks for falls. Please remove small rugs and furniture that is "in your way" and could increase the risk of falls. Stretching exercises may help with fall prevention: Yoga and Tai Chi are some examples. Low impact exercise is better, so you are not very achy the next day.  -Sun screen and avoidance of direct sun light recommended. Caution with dehydration, if working outdoors be sure to drink enough fluids.  - Some medications are not safe as we age, increases the risk of side effects and can potentially interact with other medication you are also taken;  including some of over the counter medications. Be sure to let me know when you start a new medication even if it is a dietary/vitamin supplement.   -Healthy diet low in red meet/animal fat and sugar + regular physical activity is recommended.       Screening schedule for the next 5-10 years:  Colonoscopy  Glaucoma screening/eye exam every 1-2 years.  Mammogram for breast cancer screening annually.  Flu vaccine annually.  Diabetes screening   Fall prevention   Advance directives:  Please see a lawyer  and/or go to this website to help you with advanced directives and designating a health care power of attorney so that your wishes will be followed should you become too ill to make your own medical decisions.  [email or web address]

## 2019-01-05 NOTE — Progress Notes (Signed)
HPI:   [name] is a 63 y.o. female, who is here today for CPE and AWV.  She was last seen on 11/07/17. Since her last visit she has been hospitalized, 10/17/18 for STEMI, s/p PCI and new onset of atrial fib. Following with cardiologist. She is on PLavix 75 mg daily and Eliquis 5 mg bid.  She lives with her daughter. Having cardiac rehab 3 times per week, walking and working on machines in the gym. She is following a healthful diet.  She follows with gyn,Dr Dellis Filbert, last pap smear 11/07/17. Mammogram 11/28/2017. Colonoscopy: 08/25/12. DEXA done at Dr Tula Nakayama pt report.  Immunization History  Administered Date(s) Administered  . Influenza,inj,Quad PF,6+ Mos 11/19/2013, 10/31/2014, 11/07/2017  . Pneumococcal Polysaccharide-23 09/23/2017  . Tdap 09/23/2017    AWV last done 11/07/17.  Functional Status Survey: Is the patient deaf or have difficulty hearing?: Yes(sometimes) Does the patient have difficulty seeing, even when wearing glasses/contacts?: No Does the patient have difficulty concentrating, remembering, or making decisions?: No Does the patient have difficulty walking or climbing stairs?: No Does the patient have difficulty dressing or bathing?: No Does the patient have difficulty doing errands alone such as visiting a doctor's office or shopping?: No  She has a cane here in the office, has a walker at home that she uses sometimes.  Fall Risk  01/05/2019 11/07/2017 12/19/2014 10/31/2014 09/30/2014  Falls in the past year? 1 Yes No Yes Yes  Number falls in past yr: 1 - - 2 or more 2 or more  Injury with Fall? 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 HLD on Crestor 20 mg daily. CAD and HTN: Metoprolol succinate discontinued die to severe bradycardia. TTE 10/30/18: LVEF 40-45%. Mid distal anteroseptal wall and apex hypokinetic.  Lupus on Prednisone and Plaquenil. Aplastic anemia,she is not longer following with hematologist.   Providers she sees regularly: Eye care provider: She is overdue for eye exam. General surgeon (transplant team) Dr Lupita Dawn. Rheumatologist: Dr Maralyn Sago for lupus Nephrologist, [facility]  Vascular surgeon , Leitha Bleak PA Cardiologist, Dr Marijo File   Depression screen [facility] 2/9 01/05/2019  Decreased Interest 0  Down, Depressed, Hopeless 0  PHQ - 2 Score 0  Altered sleeping -  Tired, decreased energy -  Change in appetite -  Feeling bad or failure about yourself  -  Trouble concentrating -  Moving slowly or fidgety/restless -  Suicidal thoughts -  PHQ-9 Score -    Mini-Cog - 01/05/19 1645    How many words correct?  3        Hearing Screening   125Hz 250Hz 500Hz 1000Hz 2000Hz 3000Hz 4000Hz 6000Hz 8000Hz  Right ear:           Left ear:             Visual Acuity Screening   Right eye Left eye Both eyes  Without correction:     With correction: 20/30 20/30 20/25   Review of Systems  Constitutional: Positive for fatigue. Negative for activity change, appetite change, fever and unexpected weight change.  HENT: Negative for  mouth sores, nosebleeds, sore throat and trouble swallowing.   Eyes: Negative for redness and visual disturbance.  Respiratory: Negative for cough, shortness of breath and wheezing.   Cardiovascular: Negative for chest pain, palpitations and leg swelling.  Gastrointestinal: Negative for abdominal pain, nausea and vomiting.       Negative for changes in bowel habits.  Endocrine: Negative for cold intolerance and heat intolerance.  Musculoskeletal: Positive for back pain and gait problem.  Skin: Negative for rash and wound.  Allergic/Immunologic:  Negative for environmental allergies.  Neurological: Negative for syncope, weakness and headaches.  Psychiatric/Behavioral: Negative for confusion. The patient is nervous/anxious.   All other systems reviewed and are negative.  Current Outpatient Medications on File Prior to Visit  Medication Sig Dispense Refill  . acetaminophen (TYLENOL) 500 MG tablet Take 1,000 mg by mouth every 6 (six) hours as needed for headache.    Marland Kitchen apixaban (ELIQUIS) 5 MG TABS tablet Take 5 mg by mouth 2 (two) times daily.    . clopidogrel (PLAVIX) 75 MG tablet Take 75 mg by mouth daily.    . hydroxychloroquine (PLAQUENIL) 200 MG tablet Take 1 tablet (200 mg total) by mouth 2 (two) times daily. 180 tablet 3  . metoprolol succinate (TOPROL-XL) 50 MG 24 hr tablet Take 50 mg by mouth daily. Take with or immediately following a meal.    . predniSONE (DELTASONE) 2.5 MG tablet Take 2.5 mg by mouth daily with breakfast.    . rosuvastatin (CRESTOR) 20 MG tablet Take 20 mg by mouth daily.    . tacrolimus (PROGRAF) 0.5 MG capsule Take 0.5 mg by mouth 2 (two) times daily.     No current facility-administered medications on file prior to visit.      Past Medical History:  Diagnosis Date  . Anxiety    panic attack- talks herself and takes deep breathes  . Depression   . Dyspnea    with exertion   . ESRD (end stage renal disease) ([place])    hemo TTHSAT  . FSGS (focal segmental glomerulosclerosis) 2011   By renal biopsy  . Head injury    age 29  . History of kidney stones    kidney stone  . Hx of lupus nephritis 2011   by renal biopsy  . Lupus (systemic lupus erythematosus) ([name])    followed by Dr. Amil Amen  . Obesity   . Pericarditis    age 27ish  . Renal stone   . Secondary hyperparathyroidism of renal origin ([place])   . TIA (transient ischemic attack)    no residual effects   Allergies  Allergen Reactions  . Enalapril Maleate Anaphylaxis and Other (See Comments)    Throat swells  . Penicillins Other (See  Comments)    Made patient lightheaded PATIENT HAS HAD A PCN REACTION WITH IMMEDIATE RASH, FACIAL/TONGUE/THROAT SWELLING, SOB, OR LIGHTHEADEDNESS WITH HYPOTENSION:  #  #  YES  #  #  Has patient had a PCN reaction causing severe rash involving mucus membranes or skin necrosis: No Has patient had a PCN reaction that required hospitalization: No Has patient had a PCN reaction occurring within the last 10 years: No If all of the above answers are "NO", then may proceed with Cephalosporin use.   . Chocolate Nausea And Vomiting  . Tape Itching, Rash and Other (See Comments)    Social History   Socioeconomic History  . Marital status: Legally Separated    Spouse name: Not on file  . Number of children: Not  on file  . Years of education: Not on file  . Highest education level: Not on file  Occupational History  . Not on file  Social Needs  . Financial resource strain: Not on file  . Food insecurity    Worry: Not on file    Inability: Not on file  . Transportation needs    Medical: Not on file    Non-medical: Not on file  Tobacco Use  . Smoking status: Never Smoker  . Smokeless tobacco: Never Used  Substance and Sexual Activity  . Alcohol use: No  . Drug use: No  . Sexual activity: Not Currently  Lifestyle  . Physical activity    Days per week: Not on file    Minutes per session: Not on file  . Stress: Not on file  Relationships  . Social Herbalist on phone: Not on file    Gets together: Not on file    Attends religious service: Not on file    Active member of club or organization: Not on file    Attends meetings of clubs or organizations: Not on file    Relationship status: Not on file  Other Topics Concern  . Not on file  Social History Narrative  . Not on file    Vitals:   01/05/19 1131  BP: 120/64  Pulse: 67  Resp: 16  Temp: (!) 97.2 F (36.2 C)  SpO2: 98%   Body mass index is 33.22 kg/m.   Physical Exam  Nursing note and vitals reviewed.  Constitutional: She is oriented to person, place, and time. She appears well-developed. No distress.  HENT:  Head: Normocephalic and atraumatic.  Mouth/Throat: Oropharynx is clear and moist and mucous membranes are normal.  Eyes: Pupils are equal, round, and reactive to light. Conjunctivae are normal.  Cardiovascular: Normal rate and regular rhythm.  No murmur heard. DP pulses present bilateral.  Respiratory: Effort normal and breath sounds normal. No respiratory distress.  GI: Soft. She exhibits no mass. There is no hepatomegaly. There is no abdominal tenderness.  Musculoskeletal:        General: No edema.  Lymphadenopathy:    She has no cervical adenopathy.  Neurological: She is alert and oriented to person, place, and time. She has normal strength. No cranial nerve deficit.  Reflex Scores:      Bicep reflexes are 2+ on the right side and 2+ on the left side.      Patellar reflexes are 2+ on the right side and 2+ on the left side. She has a cane but gait is otherwise stable with no assistance.  Skin: Skin is warm. No rash noted. No erythema.  Psychiatric: Her mood appears anxious.  Well groomed, good eye contact.    ASSESSMENT AND PLAN:   Ms. ADALEENA MOOERS was seen today for CPE and AWV   1. Medicare annual wellness visit, subsequent We discussed the importance of staying active, physically and mentally, as well as the benefits of a healthy/balance diet. Low impact exercise that involve stretching and strengthing are ideal.  We discussed preventive screening for the next 5-10 years, summery of recommendations given in AVS. Not good candidate for now to have colonoscopy. Mammogram: She has appt 01/2019.  Fall prevention.  Advance directives and end of life discussed, she doe snot have POA or living will, strongly recommended. Advance health care directives package given.  2. Routine general medical examination at a health care facility We discussed the importance of  regular physical activity and healthy diet for prevention of chronic illness and/or complications. Preventive guidelines reviewed. Continue following with gyn for female preventive care. Vaccination up to date.  Ca++ and vit D supplementation recommended. Next CPE in a year.  3. ESRD on hemodialysis [facility]) Following with general surgery and nephrologist.  4. Lupus ([place]) She is overdue for eye exam,she is on Plaquenil and prednisone. Following with rheumatologist.  5. Hypertension with renal disease BP adequately controlled. Continue non pharmacologic treatment.  6. Dyslipidemia Continue Crestor 20 mg daily. Last FLP 10/2018: HDL 36,LDL 74,and TC 110.   Return in about 6 months (around 07/05/2019) for f/u.   -Ms. Yashica P Kristensen was advised to return sooner than planned today if new concerns arise.    Philip Kotlyar G. [name], MD  [facility]. [place] office.

## 2019-01-08 DIAGNOSIS — I2102 ST elevation (STEMI) myocardial infarction involving left anterior descending coronary artery: Secondary | ICD-10-CM | POA: Diagnosis not present

## 2019-01-10 DIAGNOSIS — I2102 ST elevation (STEMI) myocardial infarction involving left anterior descending coronary artery: Secondary | ICD-10-CM | POA: Diagnosis not present

## 2019-01-26 DIAGNOSIS — I252 Old myocardial infarction: Secondary | ICD-10-CM | POA: Diagnosis not present

## 2019-01-26 DIAGNOSIS — Z4822 Encounter for aftercare following kidney transplant: Secondary | ICD-10-CM | POA: Diagnosis not present

## 2019-01-26 DIAGNOSIS — I2510 Atherosclerotic heart disease of native coronary artery without angina pectoris: Secondary | ICD-10-CM | POA: Diagnosis not present

## 2019-01-26 DIAGNOSIS — Z8673 Personal history of transient ischemic attack (TIA), and cerebral infarction without residual deficits: Secondary | ICD-10-CM | POA: Diagnosis not present

## 2019-01-26 DIAGNOSIS — R609 Edema, unspecified: Secondary | ICD-10-CM | POA: Diagnosis not present

## 2019-01-26 DIAGNOSIS — N041 Nephrotic syndrome with focal and segmental glomerular lesions: Secondary | ICD-10-CM | POA: Diagnosis not present

## 2019-01-26 DIAGNOSIS — R635 Abnormal weight gain: Secondary | ICD-10-CM | POA: Diagnosis not present

## 2019-01-26 DIAGNOSIS — D8989 Other specified disorders involving the immune mechanism, not elsewhere classified: Secondary | ICD-10-CM | POA: Diagnosis not present

## 2019-01-26 DIAGNOSIS — Z7901 Long term (current) use of anticoagulants: Secondary | ICD-10-CM | POA: Diagnosis not present

## 2019-01-26 DIAGNOSIS — M3214 Glomerular disease in systemic lupus erythematosus: Secondary | ICD-10-CM | POA: Diagnosis not present

## 2019-01-26 DIAGNOSIS — I480 Paroxysmal atrial fibrillation: Secondary | ICD-10-CM | POA: Diagnosis not present

## 2019-01-26 DIAGNOSIS — G479 Sleep disorder, unspecified: Secondary | ICD-10-CM | POA: Diagnosis not present

## 2019-01-26 DIAGNOSIS — Z79899 Other long term (current) drug therapy: Secondary | ICD-10-CM | POA: Diagnosis not present

## 2019-01-26 DIAGNOSIS — I10 Essential (primary) hypertension: Secondary | ICD-10-CM | POA: Diagnosis not present

## 2019-01-26 DIAGNOSIS — Z792 Long term (current) use of antibiotics: Secondary | ICD-10-CM | POA: Diagnosis not present

## 2019-01-26 DIAGNOSIS — M329 Systemic lupus erythematosus, unspecified: Secondary | ICD-10-CM | POA: Diagnosis not present

## 2019-01-26 DIAGNOSIS — Z006 Encounter for examination for normal comparison and control in clinical research program: Secondary | ICD-10-CM | POA: Diagnosis not present

## 2019-01-26 DIAGNOSIS — Z7952 Long term (current) use of systemic steroids: Secondary | ICD-10-CM | POA: Diagnosis not present

## 2019-01-26 DIAGNOSIS — M65352 Trigger finger, left little finger: Secondary | ICD-10-CM | POA: Diagnosis not present

## 2019-01-26 DIAGNOSIS — R600 Localized edema: Secondary | ICD-10-CM | POA: Diagnosis not present

## 2019-01-26 DIAGNOSIS — D72819 Decreased white blood cell count, unspecified: Secondary | ICD-10-CM | POA: Diagnosis not present

## 2019-01-26 DIAGNOSIS — I513 Intracardiac thrombosis, not elsewhere classified: Secondary | ICD-10-CM | POA: Diagnosis not present

## 2019-01-26 DIAGNOSIS — Z940 Kidney transplant status: Secondary | ICD-10-CM | POA: Diagnosis not present

## 2019-01-26 DIAGNOSIS — R233 Spontaneous ecchymoses: Secondary | ICD-10-CM | POA: Diagnosis not present

## 2019-02-07 DIAGNOSIS — R600 Localized edema: Secondary | ICD-10-CM | POA: Diagnosis not present

## 2019-02-07 DIAGNOSIS — R635 Abnormal weight gain: Secondary | ICD-10-CM | POA: Diagnosis not present

## 2019-02-07 DIAGNOSIS — I513 Intracardiac thrombosis, not elsewhere classified: Secondary | ICD-10-CM | POA: Diagnosis not present

## 2019-02-19 ENCOUNTER — Other Ambulatory Visit: Payer: Self-pay

## 2019-02-19 ENCOUNTER — Ambulatory Visit
Admission: RE | Admit: 2019-02-19 | Discharge: 2019-02-19 | Disposition: A | Discharge status: Home / Self Care | Payer: Medicare Other | Source: Ambulatory Visit | Attending: Obstetrics & Gynecology | Admitting: Obstetrics & Gynecology

## 2019-02-19 DIAGNOSIS — Z1231 Encounter for screening mammogram for malignant neoplasm of breast: Secondary | ICD-10-CM

## 2019-02-20 ENCOUNTER — Other Ambulatory Visit: Payer: Self-pay | Admitting: Obstetrics & Gynecology

## 2019-02-20 DIAGNOSIS — R928 Other abnormal and inconclusive findings on diagnostic imaging of breast: Secondary | ICD-10-CM

## 2019-02-21 DIAGNOSIS — I2102 ST elevation (STEMI) myocardial infarction involving left anterior descending coronary artery: Secondary | ICD-10-CM | POA: Diagnosis not present

## 2019-02-22 ENCOUNTER — Ambulatory Visit
Admission: RE | Admit: 2019-02-22 | Discharge: 2019-02-22 | Disposition: A | Discharge status: Home / Self Care | Payer: Medicare Other | Source: Ambulatory Visit | Attending: Obstetrics & Gynecology | Admitting: Obstetrics & Gynecology

## 2019-02-22 ENCOUNTER — Other Ambulatory Visit: Payer: Self-pay

## 2019-02-22 DIAGNOSIS — R928 Other abnormal and inconclusive findings on diagnostic imaging of breast: Secondary | ICD-10-CM

## 2019-02-22 DIAGNOSIS — R922 Inconclusive mammogram: Secondary | ICD-10-CM | POA: Diagnosis not present

## 2019-02-22 DIAGNOSIS — N6489 Other specified disorders of breast: Secondary | ICD-10-CM | POA: Diagnosis not present

## 2019-02-26 DIAGNOSIS — Z792 Long term (current) use of antibiotics: Secondary | ICD-10-CM | POA: Diagnosis not present

## 2019-02-26 DIAGNOSIS — M329 Systemic lupus erythematosus, unspecified: Secondary | ICD-10-CM | POA: Diagnosis not present

## 2019-02-26 DIAGNOSIS — R609 Edema, unspecified: Secondary | ICD-10-CM | POA: Diagnosis not present

## 2019-02-26 DIAGNOSIS — Z7952 Long term (current) use of systemic steroids: Secondary | ICD-10-CM | POA: Diagnosis not present

## 2019-02-26 DIAGNOSIS — E871 Hypo-osmolality and hyponatremia: Secondary | ICD-10-CM | POA: Diagnosis not present

## 2019-02-26 DIAGNOSIS — Z4822 Encounter for aftercare following kidney transplant: Secondary | ICD-10-CM | POA: Diagnosis not present

## 2019-02-26 DIAGNOSIS — Z940 Kidney transplant status: Secondary | ICD-10-CM | POA: Diagnosis not present

## 2019-02-26 DIAGNOSIS — I214 Non-ST elevation (NSTEMI) myocardial infarction: Secondary | ICD-10-CM | POA: Diagnosis not present

## 2019-02-26 DIAGNOSIS — R233 Spontaneous ecchymoses: Secondary | ICD-10-CM | POA: Diagnosis not present

## 2019-02-26 DIAGNOSIS — Z23 Encounter for immunization: Secondary | ICD-10-CM | POA: Diagnosis not present

## 2019-02-26 DIAGNOSIS — D72819 Decreased white blood cell count, unspecified: Secondary | ICD-10-CM | POA: Diagnosis not present

## 2019-02-26 DIAGNOSIS — Z7901 Long term (current) use of anticoagulants: Secondary | ICD-10-CM | POA: Diagnosis not present

## 2019-02-26 DIAGNOSIS — Z5181 Encounter for therapeutic drug level monitoring: Secondary | ICD-10-CM | POA: Diagnosis not present

## 2019-02-26 DIAGNOSIS — I10 Essential (primary) hypertension: Secondary | ICD-10-CM | POA: Diagnosis not present

## 2019-02-26 DIAGNOSIS — I5020 Unspecified systolic (congestive) heart failure: Secondary | ICD-10-CM | POA: Diagnosis not present

## 2019-02-26 DIAGNOSIS — I151 Hypertension secondary to other renal disorders: Secondary | ICD-10-CM | POA: Diagnosis not present

## 2019-02-26 DIAGNOSIS — M65352 Trigger finger, left little finger: Secondary | ICD-10-CM | POA: Diagnosis not present

## 2019-02-26 DIAGNOSIS — I2510 Atherosclerotic heart disease of native coronary artery without angina pectoris: Secondary | ICD-10-CM | POA: Diagnosis not present

## 2019-02-26 DIAGNOSIS — Z79899 Other long term (current) drug therapy: Secondary | ICD-10-CM | POA: Diagnosis not present

## 2019-02-26 DIAGNOSIS — D849 Immunodeficiency, unspecified: Secondary | ICD-10-CM | POA: Diagnosis not present

## 2019-02-28 DIAGNOSIS — I2102 ST elevation (STEMI) myocardial infarction involving left anterior descending coronary artery: Secondary | ICD-10-CM | POA: Diagnosis not present

## 2019-03-01 DIAGNOSIS — I2102 ST elevation (STEMI) myocardial infarction involving left anterior descending coronary artery: Secondary | ICD-10-CM | POA: Diagnosis not present

## 2019-03-02 DIAGNOSIS — I110 Hypertensive heart disease with heart failure: Secondary | ICD-10-CM | POA: Diagnosis not present

## 2019-03-02 DIAGNOSIS — Z7902 Long term (current) use of antithrombotics/antiplatelets: Secondary | ICD-10-CM | POA: Diagnosis not present

## 2019-03-02 DIAGNOSIS — I480 Paroxysmal atrial fibrillation: Secondary | ICD-10-CM | POA: Diagnosis not present

## 2019-03-02 DIAGNOSIS — Z79899 Other long term (current) drug therapy: Secondary | ICD-10-CM | POA: Diagnosis not present

## 2019-03-02 DIAGNOSIS — Z23 Encounter for immunization: Secondary | ICD-10-CM | POA: Diagnosis not present

## 2019-03-02 DIAGNOSIS — Z8673 Personal history of transient ischemic attack (TIA), and cerebral infarction without residual deficits: Secondary | ICD-10-CM | POA: Diagnosis not present

## 2019-03-02 DIAGNOSIS — I252 Old myocardial infarction: Secondary | ICD-10-CM | POA: Diagnosis not present

## 2019-03-02 DIAGNOSIS — I2510 Atherosclerotic heart disease of native coronary artery without angina pectoris: Secondary | ICD-10-CM | POA: Diagnosis not present

## 2019-03-02 DIAGNOSIS — Z955 Presence of coronary angioplasty implant and graft: Secondary | ICD-10-CM | POA: Diagnosis not present

## 2019-03-02 DIAGNOSIS — Z7952 Long term (current) use of systemic steroids: Secondary | ICD-10-CM | POA: Diagnosis not present

## 2019-03-02 DIAGNOSIS — I513 Intracardiac thrombosis, not elsewhere classified: Secondary | ICD-10-CM | POA: Diagnosis not present

## 2019-03-02 DIAGNOSIS — I5022 Chronic systolic (congestive) heart failure: Secondary | ICD-10-CM | POA: Diagnosis not present

## 2019-03-02 DIAGNOSIS — I5020 Unspecified systolic (congestive) heart failure: Secondary | ICD-10-CM | POA: Diagnosis not present

## 2019-03-02 DIAGNOSIS — Z7901 Long term (current) use of anticoagulants: Secondary | ICD-10-CM | POA: Diagnosis not present

## 2019-03-05 DIAGNOSIS — I444 Left anterior fascicular block: Secondary | ICD-10-CM | POA: Diagnosis not present

## 2019-03-05 DIAGNOSIS — Z20822 Contact with and (suspected) exposure to covid-19: Secondary | ICD-10-CM | POA: Diagnosis not present

## 2019-03-05 DIAGNOSIS — I129 Hypertensive chronic kidney disease with stage 1 through stage 4 chronic kidney disease, or unspecified chronic kidney disease: Secondary | ICD-10-CM | POA: Diagnosis not present

## 2019-03-05 DIAGNOSIS — R7989 Other specified abnormal findings of blood chemistry: Secondary | ICD-10-CM | POA: Diagnosis not present

## 2019-03-05 DIAGNOSIS — R0789 Other chest pain: Secondary | ICD-10-CM | POA: Diagnosis not present

## 2019-03-05 DIAGNOSIS — I517 Cardiomegaly: Secondary | ICD-10-CM | POA: Diagnosis not present

## 2019-03-05 DIAGNOSIS — N189 Chronic kidney disease, unspecified: Secondary | ICD-10-CM | POA: Diagnosis not present

## 2019-03-06 ENCOUNTER — Encounter: Payer: Self-pay | Admitting: Family Medicine

## 2019-03-06 DIAGNOSIS — I2510 Atherosclerotic heart disease of native coronary artery without angina pectoris: Secondary | ICD-10-CM | POA: Insufficient documentation

## 2019-03-06 DIAGNOSIS — I480 Paroxysmal atrial fibrillation: Secondary | ICD-10-CM | POA: Insufficient documentation

## 2019-03-06 DIAGNOSIS — I252 Old myocardial infarction: Secondary | ICD-10-CM | POA: Diagnosis not present

## 2019-03-06 DIAGNOSIS — I444 Left anterior fascicular block: Secondary | ICD-10-CM | POA: Diagnosis not present

## 2019-03-06 DIAGNOSIS — I5020 Unspecified systolic (congestive) heart failure: Secondary | ICD-10-CM | POA: Insufficient documentation

## 2019-03-06 DIAGNOSIS — R9431 Abnormal electrocardiogram [ECG] [EKG]: Secondary | ICD-10-CM | POA: Diagnosis not present

## 2019-03-08 DIAGNOSIS — I2102 ST elevation (STEMI) myocardial infarction involving left anterior descending coronary artery: Secondary | ICD-10-CM | POA: Diagnosis not present

## 2019-03-12 DIAGNOSIS — I2102 ST elevation (STEMI) myocardial infarction involving left anterior descending coronary artery: Secondary | ICD-10-CM | POA: Diagnosis not present

## 2019-03-14 DIAGNOSIS — I2102 ST elevation (STEMI) myocardial infarction involving left anterior descending coronary artery: Secondary | ICD-10-CM | POA: Diagnosis not present

## 2019-03-14 DIAGNOSIS — I5020 Unspecified systolic (congestive) heart failure: Secondary | ICD-10-CM | POA: Diagnosis not present

## 2019-03-15 DIAGNOSIS — H5213 Myopia, bilateral: Secondary | ICD-10-CM | POA: Diagnosis not present

## 2019-03-15 DIAGNOSIS — I2102 ST elevation (STEMI) myocardial infarction involving left anterior descending coronary artery: Secondary | ICD-10-CM | POA: Diagnosis not present

## 2019-03-15 DIAGNOSIS — Z79899 Other long term (current) drug therapy: Secondary | ICD-10-CM | POA: Diagnosis not present

## 2019-03-15 DIAGNOSIS — H25813 Combined forms of age-related cataract, bilateral: Secondary | ICD-10-CM | POA: Diagnosis not present

## 2019-03-15 DIAGNOSIS — H04123 Dry eye syndrome of bilateral lacrimal glands: Secondary | ICD-10-CM | POA: Diagnosis not present

## 2019-03-20 DIAGNOSIS — Z713 Dietary counseling and surveillance: Secondary | ICD-10-CM | POA: Diagnosis not present

## 2019-03-20 DIAGNOSIS — I2510 Atherosclerotic heart disease of native coronary artery without angina pectoris: Secondary | ICD-10-CM | POA: Diagnosis not present

## 2019-03-20 DIAGNOSIS — Z6831 Body mass index (BMI) 31.0-31.9, adult: Secondary | ICD-10-CM | POA: Diagnosis not present

## 2019-03-21 DIAGNOSIS — I2102 ST elevation (STEMI) myocardial infarction involving left anterior descending coronary artery: Secondary | ICD-10-CM | POA: Diagnosis not present

## 2019-03-22 DIAGNOSIS — I2102 ST elevation (STEMI) myocardial infarction involving left anterior descending coronary artery: Secondary | ICD-10-CM | POA: Diagnosis not present

## 2019-03-26 DIAGNOSIS — Z5181 Encounter for therapeutic drug level monitoring: Secondary | ICD-10-CM | POA: Diagnosis not present

## 2019-03-26 DIAGNOSIS — R609 Edema, unspecified: Secondary | ICD-10-CM | POA: Diagnosis not present

## 2019-03-26 DIAGNOSIS — T8610 Unspecified complication of kidney transplant: Secondary | ICD-10-CM | POA: Diagnosis not present

## 2019-03-26 DIAGNOSIS — Z792 Long term (current) use of antibiotics: Secondary | ICD-10-CM | POA: Diagnosis not present

## 2019-03-26 DIAGNOSIS — M329 Systemic lupus erythematosus, unspecified: Secondary | ICD-10-CM | POA: Diagnosis not present

## 2019-03-26 DIAGNOSIS — R0981 Nasal congestion: Secondary | ICD-10-CM | POA: Diagnosis not present

## 2019-03-26 DIAGNOSIS — I252 Old myocardial infarction: Secondary | ICD-10-CM | POA: Diagnosis not present

## 2019-03-26 DIAGNOSIS — I10 Essential (primary) hypertension: Secondary | ICD-10-CM | POA: Diagnosis not present

## 2019-03-26 DIAGNOSIS — Z7952 Long term (current) use of systemic steroids: Secondary | ICD-10-CM | POA: Diagnosis not present

## 2019-03-26 DIAGNOSIS — I5020 Unspecified systolic (congestive) heart failure: Secondary | ICD-10-CM | POA: Diagnosis not present

## 2019-03-26 DIAGNOSIS — Z01812 Encounter for preprocedural laboratory examination: Secondary | ICD-10-CM | POA: Diagnosis not present

## 2019-03-26 DIAGNOSIS — M65352 Trigger finger, left little finger: Secondary | ICD-10-CM | POA: Diagnosis not present

## 2019-03-26 DIAGNOSIS — Z4822 Encounter for aftercare following kidney transplant: Secondary | ICD-10-CM | POA: Diagnosis not present

## 2019-03-26 DIAGNOSIS — Z7902 Long term (current) use of antithrombotics/antiplatelets: Secondary | ICD-10-CM | POA: Diagnosis not present

## 2019-03-26 DIAGNOSIS — Z79899 Other long term (current) drug therapy: Secondary | ICD-10-CM | POA: Diagnosis not present

## 2019-03-26 DIAGNOSIS — Z940 Kidney transplant status: Secondary | ICD-10-CM | POA: Diagnosis not present

## 2019-03-26 DIAGNOSIS — Z20822 Contact with and (suspected) exposure to covid-19: Secondary | ICD-10-CM | POA: Diagnosis not present

## 2019-03-26 DIAGNOSIS — I2510 Atherosclerotic heart disease of native coronary artery without angina pectoris: Secondary | ICD-10-CM | POA: Diagnosis not present

## 2019-03-26 DIAGNOSIS — I110 Hypertensive heart disease with heart failure: Secondary | ICD-10-CM | POA: Diagnosis not present

## 2019-03-26 DIAGNOSIS — Z7901 Long term (current) use of anticoagulants: Secondary | ICD-10-CM | POA: Diagnosis not present

## 2019-03-28 DIAGNOSIS — M170 Bilateral primary osteoarthritis of knee: Secondary | ICD-10-CM | POA: Diagnosis not present

## 2019-03-28 DIAGNOSIS — F3289 Other specified depressive episodes: Secondary | ICD-10-CM | POA: Diagnosis not present

## 2019-03-28 DIAGNOSIS — M546 Pain in thoracic spine: Secondary | ICD-10-CM | POA: Diagnosis not present

## 2019-03-28 DIAGNOSIS — M3214 Glomerular disease in systemic lupus erythematosus: Secondary | ICD-10-CM | POA: Diagnosis not present

## 2019-03-28 DIAGNOSIS — M150 Primary generalized (osteo)arthritis: Secondary | ICD-10-CM | POA: Diagnosis not present

## 2019-03-28 DIAGNOSIS — E669 Obesity, unspecified: Secondary | ICD-10-CM | POA: Diagnosis not present

## 2019-03-28 DIAGNOSIS — M25561 Pain in right knee: Secondary | ICD-10-CM | POA: Diagnosis not present

## 2019-03-28 DIAGNOSIS — M329 Systemic lupus erythematosus, unspecified: Secondary | ICD-10-CM | POA: Diagnosis not present

## 2019-03-28 DIAGNOSIS — Z6831 Body mass index (BMI) 31.0-31.9, adult: Secondary | ICD-10-CM | POA: Diagnosis not present

## 2019-04-02 DIAGNOSIS — I2102 ST elevation (STEMI) myocardial infarction involving left anterior descending coronary artery: Secondary | ICD-10-CM | POA: Diagnosis not present

## 2019-04-03 ENCOUNTER — Other Ambulatory Visit: Payer: Self-pay

## 2019-04-04 ENCOUNTER — Ambulatory Visit (INDEPENDENT_AMBULATORY_CARE_PROVIDER_SITE_OTHER): Payer: Medicare Other | Admitting: Family Medicine

## 2019-04-04 ENCOUNTER — Encounter: Payer: Self-pay | Admitting: Family Medicine

## 2019-04-04 ENCOUNTER — Telehealth: Payer: Self-pay | Admitting: Family Medicine

## 2019-04-04 VITALS — BP 126/70 | HR 68 | Temp 96.3°F | Resp 16 | Ht 65.0 in | Wt 201.0 lb

## 2019-04-04 DIAGNOSIS — R200 Anesthesia of skin: Secondary | ICD-10-CM

## 2019-04-04 DIAGNOSIS — N186 End stage renal disease: Secondary | ICD-10-CM | POA: Diagnosis not present

## 2019-04-04 DIAGNOSIS — I129 Hypertensive chronic kidney disease with stage 1 through stage 4 chronic kidney disease, or unspecified chronic kidney disease: Secondary | ICD-10-CM

## 2019-04-04 DIAGNOSIS — I5020 Unspecified systolic (congestive) heart failure: Secondary | ICD-10-CM

## 2019-04-04 DIAGNOSIS — I480 Paroxysmal atrial fibrillation: Secondary | ICD-10-CM | POA: Diagnosis not present

## 2019-04-04 DIAGNOSIS — I2102 ST elevation (STEMI) myocardial infarction involving left anterior descending coronary artery: Secondary | ICD-10-CM | POA: Diagnosis not present

## 2019-04-04 DIAGNOSIS — Z992 Dependence on renal dialysis: Secondary | ICD-10-CM

## 2019-04-04 DIAGNOSIS — M329 Systemic lupus erythematosus, unspecified: Secondary | ICD-10-CM

## 2019-04-04 DIAGNOSIS — L853 Xerosis cutis: Secondary | ICD-10-CM | POA: Diagnosis not present

## 2019-04-04 LAB — VITAMIN B12: Vitamin B-12: 649 pg/mL (ref 211–911)

## 2019-04-04 LAB — TSH: TSH: 3.1 u[IU]/mL (ref 0.35–4.50)

## 2019-04-04 MED ORDER — GABAPENTIN 100 MG PO CAPS: 300.0000 mg | ORAL_CAPSULE | Freq: Every day | ORAL | 0 refills | Status: DC

## 2019-04-04 NOTE — Progress Notes (Signed)
ACUTE VISIT   HPI:  Chief Complaint  Patient presents with  . Foot Pain    [name] is a 64 y.o. female, who is here today complaining of "bad" toes pain, R>L. She was last seen on 01/05/2019. Since her last visit she has followed with her kidney transplant team and doing cardiac rehab.  She is reporting this is a new problem, started a couple weeks ago. Bilateral toes numbness and tingling, constant. It seems to be exacerbated by prolonged standing and walking, alleviated by soaking feet in cold/warm water. She has not noted local erythema, edema, or skin ulcers. Negative for new medications since her last visit.  She has history of lower back "little" pain, it is not radiated, and unchanged from baseline. Negative for saddle anesthesia or bowel/bladder dysfunction.  No hx of diabetes.  CAD and HFrEF: Last echo 10/30/18: LVEF 35 to 40%, apical akinesis and moderate anteroseptal and anterolateral hypokinesis consistent with LAD infarct. BNP was elevated at 1.198 on 03/26/19, so Lasix was increased to 3 tablets daily for 5 days, she is back to 2 tablets daily since Friday, 6 days ago. Negative for orthopnea, edema, and PND. She is on Crestor 20 mg daily, Plavix 75 mg daily, and Aspirin 81 mg daily. Atrial fibrillation, she is on Eliquis 5 mg bid.   He is also complaining about dry skin it is peeling "so bad." No skin rash. According the patient, it was recommended OTC cream, "Vaseline type cream" prescribed at at the time of hospital discharge.  HTN: Currently she is on valsartan 40 mg daily and metoprolol succinate 50 mg daily. Negative for severe/frequent headache, visual changes, chest pain, dyspnea, palpitation, or focal weakness.  Lupus: Last visit with rheumatology was last week. On chronic prednisone use.  ESRD s/p kidney transplant a year ago. She has been on dialysis since 06/2017.  Comprehensive Metabolic PanelResulted: 03/19/9796 2:20  PM  Component Name Value Ref Range  Sodium 135 135 - 146 MMOL/L  Potassium 4.1  Comment: NO VISIBLE HEMOLYSIS 3.5 - 5.3 MMOL/L  Chloride 99 98 - 110 MMOL/L  CO2 26 23 - 30 MMOL/L  BUN 46 (H) 8 - 24 MG/DL  Glucose 81  Comment: Patients taking eltrombopag at doses >/= 100 mg daily may show falsely elevated values of 10% or greater. 70 - 99 MG/DL  Creatinine 1.71 (H) 0.5 - 1.5 MG/DL  Calcium 8.9 8.5 - 10.5 MG/DL  Total Protein 6.8  Comment: Patients taking eltrombopag at doses >/= 100 mg daily may show falsely elevated values of 10% or greater. 6 - 8.3 G/DL  Albumin  3.7 3.5 - 5 G/DL  Total Bilirubin 0.5  Comment: Patients taking eltrombopag at doses >/= 100 mg daily may show falsely elevated values of 10% or greater. 0.1 - 1.2 MG/DL  Alkaline Phosphatase 39 25 - 125 IU/L or U/L  AST (SGOT) 20 5 - 40 IU/L or U/L  ALT (SGPT) 22 5 - 50 IU/L or U/L  Anion Gap 10 4 - 14 MMOL/L  Est. GFR African American 36 (L)  Comment: GFR estimated by CKD-EPI equations, reportable up to 90 ML/MIN/1.73 M*2     Review of Systems  Constitutional: Negative for activity change, appetite change, fatigue and fever.  HENT: Negative for mouth sores, nosebleeds and sore throat.   Respiratory: Negative for cough and wheezing.   Gastrointestinal: Negative for abdominal pain, nausea and vomiting.       Negative for changes in bowel  habits.  Endocrine: Negative for polydipsia, polyphagia and polyuria.  Genitourinary: Negative for decreased urine volume, dysuria and hematuria.  Skin: Negative for pallor and wound.  Neurological: Negative for syncope and facial asymmetry.  Rest see pertinent positives and negatives per HPI.   Current Outpatient Medications on File Prior to Visit  Medication Sig Dispense Refill  . acetaminophen (TYLENOL) 500 MG tablet Take 1,000 mg by mouth every 6 (six) hours as needed for headache.    Marland Kitchen apixaban (ELIQUIS) 5 MG TABS tablet Take 5 mg by mouth 2 (two) times daily.    .  clopidogrel (PLAVIX) 75 MG tablet Take 75 mg by mouth daily.    . furosemide (LASIX) 40 MG tablet Take 40 mg by mouth daily.    . hydroxychloroquine (PLAQUENIL) 200 MG tablet Take 1 tablet (200 mg total) by mouth 2 (two) times daily. 180 tablet 3  . lidocaine (LIDODERM) 5 % Place 1 patch onto the skin daily. Remove & Discard patch within 12 hours or as directed by MD 30 patch 6  . metoprolol succinate (TOPROL-XL) 50 MG 24 hr tablet Take 50 mg by mouth daily. Take with or immediately following a meal.    . predniSONE (DELTASONE) 2.5 MG tablet Take 2.5 mg by mouth daily with breakfast.    . rosuvastatin (CRESTOR) 20 MG tablet Take 20 mg by mouth daily.    Marland Kitchen sulfamethoxazole-trimethoprim (BACTRIM) 400-80 MG tablet Take 1 tablet by mouth every Monday, Wednesday, and Friday.    . tacrolimus (PROGRAF) 0.5 MG capsule Take 0.5 mg by mouth 2 (two) times daily.    . valsartan (DIOVAN) 40 MG tablet Take 40 mg by mouth daily.     No current facility-administered medications on file prior to visit.     Past Medical History:  Diagnosis Date  . Anxiety    panic attack- talks herself and takes deep breathes  . Depression   . Dyspnea    with exertion   . ESRD (end stage renal disease) ([place])    hemo TTHSAT  . FSGS (focal segmental glomerulosclerosis) 2011   By renal biopsy  . Head injury    age 64  . History of kidney stones    kidney stone  . Hx of lupus nephritis 2011   by renal biopsy  . Lupus (systemic lupus erythematosus) ([name])    followed by Dr. Amil Amen  . Obesity   . Pericarditis    age 84ish  . Renal stone   . Secondary hyperparathyroidism of renal origin ([place])   . TIA (transient ischemic attack)    no residual effects   Allergies  Allergen Reactions  . Enalapril Maleate Anaphylaxis and Other (See Comments)    Throat swells  . Penicillins Other (See Comments)    Made patient lightheaded PATIENT HAS HAD A PCN REACTION WITH IMMEDIATE RASH, FACIAL/TONGUE/THROAT SWELLING, SOB, OR  LIGHTHEADEDNESS WITH HYPOTENSION:  #  #  YES  #  #  Has patient had a PCN reaction causing severe rash involving mucus membranes or skin necrosis: No Has patient had a PCN reaction that required hospitalization: No Has patient had a PCN reaction occurring within the last 10 years: No If all of the above answers are "NO", then may proceed with Cephalosporin use.   . Chocolate Nausea And Vomiting  . Tape Itching, Rash and Other (See Comments)    Social History   Socioeconomic History  . Marital status: Legally Separated    Spouse name: Not on file  .  Number of children: Not on file  . Years of education: Not on file  . Highest education level: Not on file  Occupational History  . Not on file  Tobacco Use  . Smoking status: Never Smoker  . Smokeless tobacco: Never Used  Substance and Sexual Activity  . Alcohol use: No  . Drug use: No  . Sexual activity: Not Currently  Other Topics Concern  . Not on file  Social History Narrative  . Not on file   Social Determinants of Health   Financial Resource Strain:   . Difficulty of Paying Living Expenses: Not on file  Food Insecurity:   . Worried About Charity fundraiser in the Last Year: Not on file  . Ran Out of Food in the Last Year: Not on file  Transportation Needs:   . Lack of Transportation (Medical): Not on file  . Lack of Transportation (Non-Medical): Not on file  Physical Activity:   . Days of Exercise per Week: Not on file  . Minutes of Exercise per Session: Not on file  Stress:   . Feeling of Stress : Not on file  Social Connections:   . Frequency of Communication with Friends and Family: Not on file  . Frequency of Social Gatherings with Friends and Family: Not on file  . Attends Religious Services: Not on file  . Active Member of Clubs or Organizations: Not on file  . Attends Archivist Meetings: Not on file  . Marital Status: Not on file    Vitals:   04/04/19 0737  BP: 126/70  Pulse: 68  Resp:  16  Temp: (!) 96.3 F (35.7 C)   Body mass index is 33.45 kg/m.   Physical Exam  Nursing note and vitals reviewed. Constitutional: She is oriented to person, place, and time. She appears well-developed. No distress.  HENT:  Head: Normocephalic and atraumatic.  Mouth/Throat: Oropharynx is clear and moist and mucous membranes are normal. She has dentures.  Eyes: Pupils are equal, round, and reactive to light. Conjunctivae are normal.  Cardiovascular: Normal rate and regular rhythm.  No murmur heard. Pulses:      Dorsalis pedis pulses are 2+ on the right side and 2+ on the left side.  Respiratory: Effort normal and breath sounds normal. No respiratory distress.  GI: Soft. She exhibits no mass. There is no hepatomegaly. There is no abdominal tenderness.  Musculoskeletal:        General: No edema.  Lymphadenopathy:    She has no cervical adenopathy.  Neurological: She is alert and oriented to person, place, and time. She has normal strength. No cranial nerve deficit. Gait normal.  Skin: Skin is warm. No rash noted. No erythema.  Psychiatric: She has a normal mood and affect.  Well groomed, good eye contact.    ASSESSMENT AND PLAN:  Ms. Klohe was seen today for foot pain.  Diagnoses and all orders for this visit:  Lab Results  Component Value Date   TSH 3.10 04/04/2019   Lab Results  Component Value Date   [id number] 04/04/2019    Numbness of feet We discussed possible etiologies,including vit deficiency and medication side effects. Tacrolimus can cause paresthesias. She agrees with trying Gabapentin, recommend starting with 100 mg at bedtime and titrate dose to 300 mg as tolerated. Further recommendations will be given according to lab results.She will let me know if helping.  -     gabapentin (NEURONTIN) 100 MG capsule; Take 3 capsules (300 mg  total) by mouth at bedtime. -     TSH -     Vitamin B12  Lupus ([name]) Continue Prednisone 2.5 mg daily. Following  with rheumatologist.  HFrEF (heart failure with reduced ejection fraction) ([name]) Continue Furosemide, metoprolol,and Valsartan. Following with cardiologist.  Paroxysmal atrial fibrillation ([place]) Rate and rhythm controlled. Continue Metoprolol Succinate 50 mg daily and Eliquis 5 mg bid.  Hypertension with renal disease BP adequately controlled. Continue Valsartan 40 mg daily and Metoprolol Succinate 50 mg daily. Continue monitoring BP.  ESRD on dialysis [facility]) Following with nephrologist and transplant team.  Dry skin dermatitis Cetaphil or Eucerin as needed. Lukewarm water for showers. Plain soap.   Return in about 3 months (around 07/02/2019).    Jeneane Pieczynski G. [name], MD  [facility]. [place] office.

## 2019-04-04 NOTE — Patient Instructions (Addendum)
A few things to remember from today's visit:   Hypertension with renal disease  Lupus ([place]), Chronic  HFrEF (heart failure with reduced ejection fraction) ([name]), Chronic  Paroxysmal atrial fibrillation ([name]), Chronic  ESRD on dialysis ([place])  Dry skin dermatitis  Numbness of feet - Plan: gabapentin (NEURONTIN) 100 MG capsule, TSH, Vitamin B12  Cetaphil or Eucerin as needed. Lukewarm water for shower.  Gabapentin 100 mg at bedtime x 10 days,2 caps x 10 days and then 3 caps. Let me know if it is helping.  Foot care. Keep nails trimmed.  Please be sure medication list is accurate. If a new problem present, please set up appointment sooner than planned today.

## 2019-04-09 DIAGNOSIS — I2102 ST elevation (STEMI) myocardial infarction involving left anterior descending coronary artery: Secondary | ICD-10-CM | POA: Diagnosis not present

## 2019-04-11 DIAGNOSIS — I2102 ST elevation (STEMI) myocardial infarction involving left anterior descending coronary artery: Secondary | ICD-10-CM | POA: Diagnosis not present

## 2019-04-13 DIAGNOSIS — Z79899 Other long term (current) drug therapy: Secondary | ICD-10-CM | POA: Diagnosis not present

## 2019-04-13 DIAGNOSIS — Z7901 Long term (current) use of anticoagulants: Secondary | ICD-10-CM | POA: Diagnosis not present

## 2019-04-13 DIAGNOSIS — I252 Old myocardial infarction: Secondary | ICD-10-CM | POA: Diagnosis not present

## 2019-04-13 DIAGNOSIS — I2102 ST elevation (STEMI) myocardial infarction involving left anterior descending coronary artery: Secondary | ICD-10-CM | POA: Diagnosis not present

## 2019-04-13 DIAGNOSIS — Z940 Kidney transplant status: Secondary | ICD-10-CM | POA: Diagnosis not present

## 2019-04-13 DIAGNOSIS — I480 Paroxysmal atrial fibrillation: Secondary | ICD-10-CM | POA: Diagnosis not present

## 2019-04-13 DIAGNOSIS — M329 Systemic lupus erythematosus, unspecified: Secondary | ICD-10-CM | POA: Diagnosis not present

## 2019-04-13 DIAGNOSIS — I513 Intracardiac thrombosis, not elsewhere classified: Secondary | ICD-10-CM | POA: Diagnosis not present

## 2019-04-13 DIAGNOSIS — I5020 Unspecified systolic (congestive) heart failure: Secondary | ICD-10-CM | POA: Diagnosis not present

## 2019-04-16 ENCOUNTER — Other Ambulatory Visit: Payer: Self-pay

## 2019-04-16 ENCOUNTER — Telehealth: Payer: Self-pay | Admitting: Family Medicine

## 2019-04-16 NOTE — Telephone Encounter (Signed)
Pt woke up this morning and her R. Ear felt funny. She put a q-tip in it and blood was on the q-tip-she tried this several times. Pt stated she is having pain in her head and she is congested. She would like advice on what to do.  She wanted an appt today but there is no availability on [facility] Schedule.   Pt can be reached at [phone]

## 2019-04-16 NOTE — Telephone Encounter (Signed)
LVM to return my call to set up appt for tomorrow

## 2019-04-16 NOTE — Telephone Encounter (Signed)
Can you offer her the appt for the 11am slot tomorrow? I know it says same day, but it should let you schedule it by now. Dr. [name] will need to actually look into the ear, so a virtual wouldn't work very well.

## 2019-04-17 ENCOUNTER — Ambulatory Visit (INDEPENDENT_AMBULATORY_CARE_PROVIDER_SITE_OTHER): Payer: Medicare Other | Admitting: Family Medicine

## 2019-04-17 ENCOUNTER — Encounter: Payer: Self-pay | Admitting: Family Medicine

## 2019-04-17 VITALS — BP 132/60 | HR 65 | Temp 96.2°F | Resp 12 | Ht 65.0 in

## 2019-04-17 DIAGNOSIS — R040 Epistaxis: Secondary | ICD-10-CM

## 2019-04-17 DIAGNOSIS — L309 Dermatitis, unspecified: Secondary | ICD-10-CM

## 2019-04-17 DIAGNOSIS — Z7901 Long term (current) use of anticoagulants: Secondary | ICD-10-CM

## 2019-04-17 DIAGNOSIS — R519 Headache, unspecified: Secondary | ICD-10-CM

## 2019-04-17 DIAGNOSIS — S00411A Abrasion of right ear, initial encounter: Secondary | ICD-10-CM

## 2019-04-17 MED ORDER — PREDNISOLONE SODIUM PHOSPHATE 1 % OP SOLN: OPHTHALMIC | 0 refills | Status: DC

## 2019-04-17 NOTE — Progress Notes (Signed)
HPI:  Chief Complaint  Patient presents with  . Numbness  . bleeding out of ear   [name] is a 64 y.o. female with hx of atrial fib,CAD,HTN,and ESRD among some who is here today complaining of right ear bleeding in right noted 2 days ago. She has had bilateral ear canal pruritus, she was scratching right ear with 5th fingernail, then used a Qtip and noted red blood on Qtip and a small blood clot. She has cleaned ear a few times and Qtip gets pinkish,no longer having red blood.  She is concerned because she is on Eliqis 5 mg bid and Plavix 75 mg daily. She denies fever,earache,hearing changes,sore throat,gum bleeding,gross hematuria,and blood in stool.  Occasionally she has mild nose bleed, exacerbated by blowing her nose. It usually stops spontaneously. Last week she had nasal congestion and rhinorrhea. Dry nose now. She has not tried OTC treatments.  She also mentioned that earlier today she had a mild pressure headache and lower back pain. She is not sure which area of head was hurting. No associated N/V or photophobia.  She has had headaches before. She felt left cheek numbness x 2 min. Pain resolved after taking Tylenol. She has not noted visual changes,CP.SOB,palpitations,diaphoresis,or focal weakness.  Review of Systems  Constitutional: Negative for activity change, appetite change and fatigue.  HENT: Negative for facial swelling, mouth sores and sinus pain.   Respiratory: Negative for cough and wheezing.   Cardiovascular: Negative for leg swelling.  Gastrointestinal: Negative for abdominal pain, nausea and vomiting.       Negative for changes in bowel habits.  Genitourinary: Negative for decreased urine volume and vaginal bleeding.  Neurological: Negative for tremors, syncope and facial asymmetry.  Hematological: Negative for adenopathy. Bruises/bleeds easily.  Rest see pertinent positives and negatives per HPI.  Current Outpatient Medications  on File Prior to Visit  Medication Sig Dispense Refill  . acetaminophen (TYLENOL) 500 MG tablet Take 1,000 mg by mouth every 6 (six) hours as needed for headache.    Marland Kitchen apixaban (ELIQUIS) 5 MG TABS tablet Take 5 mg by mouth 2 (two) times daily.    . clopidogrel (PLAVIX) 75 MG tablet Take 75 mg by mouth daily.    . furosemide (LASIX) 40 MG tablet Take 40 mg by mouth daily.    Marland Kitchen gabapentin (NEURONTIN) 100 MG capsule Take 3 capsules (300 mg total) by mouth at bedtime. 90 capsule 0  . hydroxychloroquine (PLAQUENIL) 200 MG tablet Take 1 tablet (200 mg total) by mouth 2 (two) times daily. 180 tablet 3  . lidocaine (LIDODERM) 5 % Place 1 patch onto the skin daily. Remove & Discard patch within 12 hours or as directed by MD 30 patch 6  . metoprolol succinate (TOPROL-XL) 50 MG 24 hr tablet Take 50 mg by mouth daily. Take with or immediately following a meal.    . predniSONE (DELTASONE) 2.5 MG tablet Take 2.5 mg by mouth daily with breakfast.    . rosuvastatin (CRESTOR) 20 MG tablet Take 20 mg by mouth daily.    Marland Kitchen sulfamethoxazole-trimethoprim (BACTRIM) 400-80 MG tablet Take 1 tablet by mouth every Monday, Wednesday, and Friday.    . tacrolimus (PROGRAF) 0.5 MG capsule Take 0.5 mg by mouth 2 (two) times daily.    . valsartan (DIOVAN) 40 MG tablet Take 40 mg by mouth daily.     No current facility-administered medications on file prior to visit.     Past Medical History:  Diagnosis Date  . Anxiety    panic attack- talks herself and takes deep breathes  . Depression   . Dyspnea    with exertion   . ESRD (end stage renal disease) ([place])    hemo TTHSAT  . FSGS (focal segmental glomerulosclerosis) 2011   By renal biopsy  . Head injury    age 69  . History of kidney stones    kidney stone  . Hx of lupus nephritis 2011   by renal biopsy  . Lupus (systemic lupus erythematosus) ([name])    followed by Dr. Amil Amen  . Obesity   . Pericarditis    age 58ish  . Renal stone   . Secondary  hyperparathyroidism of renal origin ([place])   . TIA (transient ischemic attack)    no residual effects   Allergies  Allergen Reactions  . Enalapril Maleate Anaphylaxis and Other (See Comments)    Throat swells  . Penicillins Other (See Comments)    Made patient lightheaded PATIENT HAS HAD A PCN REACTION WITH IMMEDIATE RASH, FACIAL/TONGUE/THROAT SWELLING, SOB, OR LIGHTHEADEDNESS WITH HYPOTENSION:  #  #  YES  #  #  Has patient had a PCN reaction causing severe rash involving mucus membranes or skin necrosis: No Has patient had a PCN reaction that required hospitalization: No Has patient had a PCN reaction occurring within the last 10 years: No If all of the above answers are "NO", then may proceed with Cephalosporin use.   . Chocolate Nausea And Vomiting  . Tape Itching, Rash and Other (See Comments)    Social History   Socioeconomic History  . Marital status: Legally Separated    Spouse name: Not on file  . Number of children: Not on file  . Years of education: Not on file  . Highest education level: Not on file  Occupational History  . Not on file  Tobacco Use  . Smoking status: Never Smoker  . Smokeless tobacco: Never Used  Substance and Sexual Activity  . Alcohol use: No  . Drug use: No  . Sexual activity: Not Currently  Other Topics Concern  . Not on file  Social History Narrative  . Not on file   Social Determinants of Health   Financial Resource Strain:   . Difficulty of Paying Living Expenses: Not on file  Food Insecurity:   . Worried About Charity fundraiser in the Last Year: Not on file  . Ran Out of Food in the Last Year: Not on file  Transportation Needs:   . Lack of Transportation (Medical): Not on file  . Lack of Transportation (Non-Medical): Not on file  Physical Activity:   . Days of Exercise per Week: Not on file  . Minutes of Exercise per Session: Not on file  Stress:   . Feeling of Stress : Not on file  Social Connections:   . Frequency of  Communication with Friends and Family: Not on file  . Frequency of Social Gatherings with Friends and Family: Not on file  . Attends Religious Services: Not on file  . Active Member of Clubs or Organizations: Not on file  . Attends Archivist Meetings: Not on file  . Marital Status: Not on file   Vitals:   04/17/19 1056  BP: 132/60  Pulse: 65  Resp: 12  Temp: (!) 96.2 F (35.7 C)  SpO2: 98%   Body mass index is 33.45 kg/m.  Physical Exam  Nursing note and vitals reviewed. Constitutional: She is  oriented to person, place, and time. She appears well-developed and well-nourished. She does not appear ill. No distress.  HENT:  Head: Atraumatic.  Right Ear: External ear and ear canal normal. A middle ear effusion is present. No decreased hearing is noted.  Left Ear: Tympanic membrane, external ear and ear canal normal. No decreased hearing is noted.  Nose: No mucosal edema, rhinorrhea or nasal septal hematoma. Right sinus exhibits no maxillary sinus tenderness and no frontal sinus tenderness. Left sinus exhibits no maxillary sinus tenderness and no frontal sinus tenderness.  Mouth/Throat: Oropharynx is clear and moist and mucous membranes are normal. She has dentures.  Enlarged turbinates L>R and 1 mm blood clot on small superficial excoriation with clear crust, septum right nostril.  Right ear canal with a 1-2 mm excoriation, no active bleeding. Pain is not elicited with otoscopic exam.  Eyes: Conjunctivae are normal.  Cardiovascular: Normal rate and regular rhythm.  No murmur heard. Respiratory: Effort normal and breath sounds normal. No respiratory distress.  Lymphadenopathy:       Head (right side): No preauricular and no posterior auricular adenopathy present.       Head (left side): No preauricular and no posterior auricular adenopathy present.    She has no cervical adenopathy.  Neurological: She is alert and oriented to person, place, and time. She has normal  strength.  Reflex Scores:      Bicep reflexes are 2+ on the right side and 2+ on the left side.      Patellar reflexes are 2+ on the right side and 2+ on the left side. Skin: Skin is warm. No rash noted. No erythema.  Psychiatric: Her mood appears anxious.  Well groomed, good eye contact.   ASSESSMENT AND PLAN:  Ms. Makyla was seen today for numbness and bleeding out of ear.  Diagnoses and all orders for this visit:  Excoriation of right ear canal, initial encounter Recommend avoiding scratching or inserting Qtips in ears. I am expecting skin lesion to heal.  Epistaxis Possible etiologies discuss, rhinitis,dry nasal mucosa and aggravated by anticoagulation. Hx and examination do not suggest a serious process. [place] or vaseline on nasal mucosa may help. Nasal saline irrigations. Instructed about warning signs.  Dermatitis of external ear Topical steroid in ear canal recommended,once daily prn. F/U as needed.  -     prednisoLONE sodium phosphate (INFLAMASE FORTE) 1 % ophthalmic solution; Apply 1 drop in ear ear daily as needed.  Chronic anticoagulation We discussed some side effects of Plavix and Eliquis,benefits and risks. Try to avoid trauma. No changes in current management.  Headache, unspecified headache type Headache has resolved. ? Tension headache among some to consider. Neurologic examination is normal. She has high CV risk, TIA in 2015. I do not think imaging is needed at this time but she was clearly instructed about warning signs. She voices understanding and agrees with plan.   Return if symptoms worsen or fail to improve.   Araiya Tilmon G. [name], MD  [facility]. [place] office.

## 2019-04-17 NOTE — Patient Instructions (Signed)
A few things to remember from today's visit:   Excoriation of right ear canal, initial encounter  Epistaxis  Dermatitis of external ear - Plan: prednisoLONE sodium phosphate (INFLAMASE FORTE) 1 % ophthalmic solution  Avoid scratching ear. Saline irrigation for nose.  If severe headache and/or new focal deficit you need to call 911.  Please be sure medication list is accurate. If a new problem present, please set up appointment sooner than planned today.

## 2019-04-19 ENCOUNTER — Encounter: Payer: Self-pay | Admitting: Family Medicine

## 2019-04-23 DIAGNOSIS — Z7902 Long term (current) use of antithrombotics/antiplatelets: Secondary | ICD-10-CM | POA: Diagnosis not present

## 2019-04-23 DIAGNOSIS — Z4822 Encounter for aftercare following kidney transplant: Secondary | ICD-10-CM | POA: Diagnosis not present

## 2019-04-23 DIAGNOSIS — M329 Systemic lupus erythematosus, unspecified: Secondary | ICD-10-CM | POA: Diagnosis not present

## 2019-04-23 DIAGNOSIS — Z7952 Long term (current) use of systemic steroids: Secondary | ICD-10-CM | POA: Diagnosis not present

## 2019-04-23 DIAGNOSIS — I2510 Atherosclerotic heart disease of native coronary artery without angina pectoris: Secondary | ICD-10-CM | POA: Diagnosis not present

## 2019-04-23 DIAGNOSIS — Z7901 Long term (current) use of anticoagulants: Secondary | ICD-10-CM | POA: Diagnosis not present

## 2019-04-23 DIAGNOSIS — R609 Edema, unspecified: Secondary | ICD-10-CM | POA: Diagnosis not present

## 2019-04-23 DIAGNOSIS — Z792 Long term (current) use of antibiotics: Secondary | ICD-10-CM | POA: Diagnosis not present

## 2019-04-23 DIAGNOSIS — I5020 Unspecified systolic (congestive) heart failure: Secondary | ICD-10-CM | POA: Diagnosis not present

## 2019-04-23 DIAGNOSIS — I252 Old myocardial infarction: Secondary | ICD-10-CM | POA: Diagnosis not present

## 2019-04-23 DIAGNOSIS — I959 Hypotension, unspecified: Secondary | ICD-10-CM | POA: Diagnosis not present

## 2019-04-23 DIAGNOSIS — Z79899 Other long term (current) drug therapy: Secondary | ICD-10-CM | POA: Diagnosis not present

## 2019-04-23 DIAGNOSIS — D72819 Decreased white blood cell count, unspecified: Secondary | ICD-10-CM | POA: Diagnosis not present

## 2019-04-23 DIAGNOSIS — Z940 Kidney transplant status: Secondary | ICD-10-CM | POA: Diagnosis not present

## 2019-04-23 DIAGNOSIS — R238 Other skin changes: Secondary | ICD-10-CM | POA: Diagnosis not present

## 2019-04-26 DIAGNOSIS — I480 Paroxysmal atrial fibrillation: Secondary | ICD-10-CM | POA: Diagnosis not present

## 2019-04-26 DIAGNOSIS — I513 Intracardiac thrombosis, not elsewhere classified: Secondary | ICD-10-CM | POA: Diagnosis not present

## 2019-04-26 DIAGNOSIS — I5020 Unspecified systolic (congestive) heart failure: Secondary | ICD-10-CM | POA: Diagnosis not present

## 2019-04-26 DIAGNOSIS — Z4822 Encounter for aftercare following kidney transplant: Secondary | ICD-10-CM | POA: Diagnosis not present

## 2019-05-04 DIAGNOSIS — I5020 Unspecified systolic (congestive) heart failure: Secondary | ICD-10-CM | POA: Diagnosis not present

## 2019-05-07 DIAGNOSIS — Z01812 Encounter for preprocedural laboratory examination: Secondary | ICD-10-CM | POA: Diagnosis not present

## 2019-05-07 DIAGNOSIS — I5020 Unspecified systolic (congestive) heart failure: Secondary | ICD-10-CM | POA: Diagnosis not present

## 2019-05-07 DIAGNOSIS — Z20822 Contact with and (suspected) exposure to covid-19: Secondary | ICD-10-CM | POA: Diagnosis not present

## 2019-05-09 DIAGNOSIS — I480 Paroxysmal atrial fibrillation: Secondary | ICD-10-CM | POA: Diagnosis not present

## 2019-05-11 DIAGNOSIS — Z955 Presence of coronary angioplasty implant and graft: Secondary | ICD-10-CM | POA: Diagnosis not present

## 2019-05-11 DIAGNOSIS — R197 Diarrhea, unspecified: Secondary | ICD-10-CM | POA: Diagnosis not present

## 2019-05-11 DIAGNOSIS — I2102 ST elevation (STEMI) myocardial infarction involving left anterior descending coronary artery: Secondary | ICD-10-CM | POA: Diagnosis not present

## 2019-05-11 DIAGNOSIS — Z7901 Long term (current) use of anticoagulants: Secondary | ICD-10-CM | POA: Diagnosis not present

## 2019-05-11 DIAGNOSIS — I480 Paroxysmal atrial fibrillation: Secondary | ICD-10-CM | POA: Diagnosis not present

## 2019-05-11 DIAGNOSIS — I5020 Unspecified systolic (congestive) heart failure: Secondary | ICD-10-CM | POA: Diagnosis not present

## 2019-05-11 DIAGNOSIS — I513 Intracardiac thrombosis, not elsewhere classified: Secondary | ICD-10-CM | POA: Diagnosis not present

## 2019-05-11 DIAGNOSIS — Z7902 Long term (current) use of antithrombotics/antiplatelets: Secondary | ICD-10-CM | POA: Diagnosis not present

## 2019-05-11 DIAGNOSIS — I252 Old myocardial infarction: Secondary | ICD-10-CM | POA: Diagnosis not present

## 2019-05-14 DIAGNOSIS — I081 Rheumatic disorders of both mitral and tricuspid valves: Secondary | ICD-10-CM | POA: Diagnosis not present

## 2019-05-14 DIAGNOSIS — I480 Paroxysmal atrial fibrillation: Secondary | ICD-10-CM | POA: Diagnosis not present

## 2019-05-14 DIAGNOSIS — I700 Atherosclerosis of aorta: Secondary | ICD-10-CM | POA: Diagnosis not present

## 2019-05-14 DIAGNOSIS — I5020 Unspecified systolic (congestive) heart failure: Secondary | ICD-10-CM | POA: Diagnosis not present

## 2019-05-14 DIAGNOSIS — I513 Intracardiac thrombosis, not elsewhere classified: Secondary | ICD-10-CM | POA: Diagnosis not present

## 2019-05-22 DIAGNOSIS — N184 Chronic kidney disease, stage 4 (severe): Secondary | ICD-10-CM | POA: Diagnosis not present

## 2019-05-22 DIAGNOSIS — I5020 Unspecified systolic (congestive) heart failure: Secondary | ICD-10-CM | POA: Diagnosis not present

## 2019-05-28 ENCOUNTER — Other Ambulatory Visit: Payer: Self-pay

## 2019-05-29 ENCOUNTER — Ambulatory Visit (INDEPENDENT_AMBULATORY_CARE_PROVIDER_SITE_OTHER): Payer: Medicare Other | Admitting: Family Medicine

## 2019-05-29 ENCOUNTER — Encounter: Payer: Self-pay | Admitting: Family Medicine

## 2019-05-29 VITALS — BP 130/72 | HR 65 | Temp 96.0°F | Resp 12 | Ht 65.0 in | Wt 203.0 lb

## 2019-05-29 DIAGNOSIS — Z7901 Long term (current) use of anticoagulants: Secondary | ICD-10-CM

## 2019-05-29 DIAGNOSIS — J309 Allergic rhinitis, unspecified: Secondary | ICD-10-CM

## 2019-05-29 DIAGNOSIS — F419 Anxiety disorder, unspecified: Secondary | ICD-10-CM

## 2019-05-29 DIAGNOSIS — I5020 Unspecified systolic (congestive) heart failure: Secondary | ICD-10-CM

## 2019-05-29 DIAGNOSIS — I129 Hypertensive chronic kidney disease with stage 1 through stage 4 chronic kidney disease, or unspecified chronic kidney disease: Secondary | ICD-10-CM

## 2019-05-29 DIAGNOSIS — R040 Epistaxis: Secondary | ICD-10-CM | POA: Diagnosis not present

## 2019-05-29 MED ORDER — CITALOPRAM HYDROBROMIDE 10 MG PO TABS: 10.0000 mg | ORAL_TABLET | Freq: Every day | ORAL | 1 refills | Status: DC

## 2019-05-29 NOTE — Patient Instructions (Addendum)
A few things to remember from today's visit:  Avoid blowing your nose.  Celexa 10 mg added today for nerves. Take med daily. Rest unchanged  Please be sure medication list is accurate. If a new problem present, please set up appointment sooner than planned today.
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
## 2019-05-31 ENCOUNTER — Encounter: Payer: Self-pay | Admitting: Family Medicine

## 2019-05-31 DIAGNOSIS — Z79899 Other long term (current) drug therapy: Secondary | ICD-10-CM | POA: Diagnosis not present

## 2019-06-01 DIAGNOSIS — Z955 Presence of coronary angioplasty implant and graft: Secondary | ICD-10-CM | POA: Diagnosis not present

## 2019-06-01 DIAGNOSIS — I2102 ST elevation (STEMI) myocardial infarction involving left anterior descending coronary artery: Secondary | ICD-10-CM | POA: Diagnosis not present

## 2019-06-01 DIAGNOSIS — I513 Intracardiac thrombosis, not elsewhere classified: Secondary | ICD-10-CM | POA: Diagnosis not present

## 2019-06-01 DIAGNOSIS — I252 Old myocardial infarction: Secondary | ICD-10-CM | POA: Diagnosis not present

## 2019-06-01 DIAGNOSIS — I2510 Atherosclerotic heart disease of native coronary artery without angina pectoris: Secondary | ICD-10-CM | POA: Diagnosis not present

## 2019-06-01 DIAGNOSIS — Z7902 Long term (current) use of antithrombotics/antiplatelets: Secondary | ICD-10-CM | POA: Diagnosis not present

## 2019-06-01 DIAGNOSIS — I5020 Unspecified systolic (congestive) heart failure: Secondary | ICD-10-CM | POA: Diagnosis not present

## 2019-06-01 DIAGNOSIS — Z7901 Long term (current) use of anticoagulants: Secondary | ICD-10-CM | POA: Diagnosis not present

## 2019-06-01 DIAGNOSIS — Z86718 Personal history of other venous thrombosis and embolism: Secondary | ICD-10-CM | POA: Diagnosis not present

## 2019-06-01 DIAGNOSIS — I480 Paroxysmal atrial fibrillation: Secondary | ICD-10-CM | POA: Diagnosis not present

## 2019-06-01 DIAGNOSIS — I110 Hypertensive heart disease with heart failure: Secondary | ICD-10-CM | POA: Diagnosis not present

## 2019-06-01 DIAGNOSIS — R7303 Prediabetes: Secondary | ICD-10-CM | POA: Diagnosis not present

## 2019-06-01 DIAGNOSIS — Z79899 Other long term (current) drug therapy: Secondary | ICD-10-CM | POA: Diagnosis not present

## 2019-06-05 DIAGNOSIS — N189 Chronic kidney disease, unspecified: Secondary | ICD-10-CM | POA: Diagnosis not present

## 2019-06-05 DIAGNOSIS — Z940 Kidney transplant status: Secondary | ICD-10-CM | POA: Diagnosis not present

## 2019-06-05 DIAGNOSIS — I129 Hypertensive chronic kidney disease with stage 1 through stage 4 chronic kidney disease, or unspecified chronic kidney disease: Secondary | ICD-10-CM | POA: Diagnosis not present

## 2019-06-05 DIAGNOSIS — Z79899 Other long term (current) drug therapy: Secondary | ICD-10-CM | POA: Diagnosis not present

## 2019-06-05 DIAGNOSIS — D631 Anemia in chronic kidney disease: Secondary | ICD-10-CM | POA: Diagnosis not present

## 2019-06-05 DIAGNOSIS — I480 Paroxysmal atrial fibrillation: Secondary | ICD-10-CM | POA: Diagnosis not present

## 2019-06-05 DIAGNOSIS — I770 Arteriovenous fistula, acquired: Secondary | ICD-10-CM | POA: Diagnosis not present

## 2019-06-05 DIAGNOSIS — I2510 Atherosclerotic heart disease of native coronary artery without angina pectoris: Secondary | ICD-10-CM | POA: Diagnosis not present

## 2019-06-05 DIAGNOSIS — E785 Hyperlipidemia, unspecified: Secondary | ICD-10-CM | POA: Diagnosis not present

## 2019-06-15 DIAGNOSIS — I129 Hypertensive chronic kidney disease with stage 1 through stage 4 chronic kidney disease, or unspecified chronic kidney disease: Secondary | ICD-10-CM | POA: Diagnosis not present

## 2019-06-15 DIAGNOSIS — Z940 Kidney transplant status: Secondary | ICD-10-CM | POA: Diagnosis not present

## 2019-06-22 ENCOUNTER — Encounter (HOSPITAL_COMMUNITY): Payer: Self-pay | Admitting: Emergency Medicine

## 2019-06-22 ENCOUNTER — Other Ambulatory Visit: Payer: Self-pay

## 2019-06-22 ENCOUNTER — Emergency Department (HOSPITAL_COMMUNITY): Payer: Medicare Other

## 2019-06-22 ENCOUNTER — Emergency Department (HOSPITAL_COMMUNITY)
Admission: EM | Admit: 2019-06-22 | Discharge: 2019-06-23 | Disposition: A | Discharge status: Home / Self Care | Payer: Medicare Other | Attending: Emergency Medicine | Admitting: Emergency Medicine

## 2019-06-22 DIAGNOSIS — R0789 Other chest pain: Secondary | ICD-10-CM | POA: Diagnosis not present

## 2019-06-22 DIAGNOSIS — Z8673 Personal history of transient ischemic attack (TIA), and cerebral infarction without residual deficits: Secondary | ICD-10-CM | POA: Diagnosis not present

## 2019-06-22 DIAGNOSIS — Z992 Dependence on renal dialysis: Secondary | ICD-10-CM | POA: Diagnosis not present

## 2019-06-22 DIAGNOSIS — R079 Chest pain, unspecified: Secondary | ICD-10-CM

## 2019-06-22 DIAGNOSIS — I959 Hypotension, unspecified: Secondary | ICD-10-CM | POA: Diagnosis not present

## 2019-06-22 DIAGNOSIS — N186 End stage renal disease: Secondary | ICD-10-CM | POA: Insufficient documentation

## 2019-06-22 DIAGNOSIS — I499 Cardiac arrhythmia, unspecified: Secondary | ICD-10-CM | POA: Diagnosis not present

## 2019-06-22 LAB — CBC
HCT: 34 % — ABNORMAL LOW (ref 36.0–46.0)
Hemoglobin: 10.2 g/dL — ABNORMAL LOW (ref 12.0–15.0)
MCH: 28.5 pg (ref 26.0–34.0)
MCHC: 30 g/dL (ref 30.0–36.0)
MCV: 95 fL (ref 80.0–100.0)
Platelets: 205 10*3/uL (ref 150–400)
RBC: 3.58 MIL/uL — ABNORMAL LOW (ref 3.87–5.11)
RDW: 13.7 % (ref 11.5–15.5)
WBC: 6.7 10*3/uL (ref 4.0–10.5)
nRBC: 0 % (ref 0.0–0.2)

## 2019-06-22 LAB — TROPONIN I (HIGH SENSITIVITY): Troponin I (High Sensitivity): 46 ng/L — ABNORMAL HIGH (ref <18)

## 2019-06-22 LAB — BASIC METABOLIC PANEL
Anion gap: 12 (ref 5–15)
BUN: 42 mg/dL — ABNORMAL HIGH (ref 8–23)
CO2: 22 mmol/L (ref 22–32)
Calcium: 7.9 mg/dL — ABNORMAL LOW (ref 8.9–10.3)
Chloride: 103 mmol/L (ref 98–111)
Creatinine, Ser: 2.35 mg/dL — ABNORMAL HIGH (ref 0.44–1.00)
GFR calc Af Amer: 25 mL/min — ABNORMAL LOW (ref >60)
GFR calc non Af Amer: 21 mL/min — ABNORMAL LOW (ref >60)
Glucose, Bld: 98 mg/dL (ref 70–99)
Potassium: 4.2 mmol/L (ref 3.5–5.1)
Sodium: 137 mmol/L (ref 135–145)

## 2019-06-22 MED ORDER — SODIUM CHLORIDE 0.9% FLUSH: 3.0000 mL | Freq: Once | INTRAVENOUS | Status: DC

## 2019-06-22 NOTE — Discharge Instructions (Addendum)
You were evaluated in the Emergency Department and after careful evaluation, we did not find any emergent condition requiring admission or further testing in the hospital.  Your exam/testing today is overall reassuring.  We recommend close follow-up with your cardiologist.  Please return to the Emergency Department if you experience any worsening of your condition.  We encourage you to follow up with a primary care provider.  Thank you for allowing [name] to be a part of your care.

## 2019-06-22 NOTE — ED Provider Notes (Signed)
[facility] Emergency Department Provider Note MRN:  [id number]  Arrival date & time: 06/22/19     Chief Complaint   Chest Pain   History of Present Illness   Jenna [name] is a 64 y.o. year-old female with a history of kidney transplant, CAD presenting to the ED with chief complaint of chest pain.  Central chest pain that she is unable to describe, just says it hurt.  Occurred at 3 AM, went away and then occurred again at 8 AM.  Both times occurring after eating something and then lying flat.  Denies any other associated symptoms, no dizziness, no diaphoresis, no nausea, no vomiting, no shortness of breath.  Currently is pain-free and has been pain-free for the past 4 hours.  Review of Systems  A complete 10 system review of systems was obtained and all systems are negative except as noted in the HPI and PMH.   Patient's Health History    Past Medical History:  Diagnosis Date  . Anxiety    panic attack- talks herself and takes deep breathes  . Depression   . Dyspnea    with exertion   . ESRD (end stage renal disease) ([place])    hemo TTHSAT  . FSGS (focal segmental glomerulosclerosis) 2011   By renal biopsy  . Head injury    age 31  . History of kidney stones    kidney stone  . Hx of lupus nephritis 2011   by renal biopsy  . Lupus (systemic lupus erythematosus) ([name])    followed by Dr. Amil Amen  . Obesity   . Pericarditis    age 68ish  . Renal stone   . Secondary hyperparathyroidism of renal origin ([place])   . TIA (transient ischemic attack)    no residual effects    Past Surgical History:  Procedure Laterality Date  . AV FISTULA PLACEMENT Left 07/04/2017   Procedure: ARTERIOVENOUS (AV) FISTULA CREATION BRACHIOCEPHALIC;  Surgeon: [name], MD;  Location: [facility] OR;  Service: Vascular;  Laterality: Left;  . COLONOSCOPY W/ POLYPECTOMY    . IR FLUORO GUIDE CV LINE RIGHT  06/28/2017  . IR US GUIDE VASC ACCESS RIGHT  06/28/2017  . KIDNEY SURGERY      kidney transplant 2020  . REVISION OF ARTERIOVENOUS GORETEX GRAFT Left 11/30/2017   Procedure: REVISION OF ARTERIOVENOUS FISTULA LEFT ARM Superfistulization and branch ligation.;  Surgeon: Waynetta Sandy, MD;  Location: [facility] OR;  Service: Vascular;  Laterality: Left;    Family History  Problem Relation Age of Onset  . Diabetes Mother   . Hypertension Father   . Cancer Sister   . Hypertension Brother     Social History   Socioeconomic History  . Marital status: Legally Separated    Spouse name: Not on file  . Number of children: Not on file  . Years of education: Not on file  . Highest education level: Not on file  Occupational History  . Not on file  Tobacco Use  . Smoking status: Never Smoker  . Smokeless tobacco: Never Used  Substance and Sexual Activity  . Alcohol use: No  . Drug use: No  . Sexual activity: Not Currently  Other Topics Concern  . Not on file  Social History Narrative  . Not on file   Social Determinants of Health   Financial Resource Strain:   . Difficulty of Paying Living Expenses:   Food Insecurity:   . Worried About Charity fundraiser in the  Last Year:   . [place] in the Last Year:   Transportation Needs:   . Film/video editor (Medical):   Marland Kitchen Lack of Transportation (Non-Medical):   Physical Activity:   . Days of Exercise per Week:   . Minutes of Exercise per Session:   Stress:   . Feeling of Stress :   Social Connections:   . Frequency of Communication with Friends and Family:   . Frequency of Social Gatherings with Friends and Family:   . Attends Religious Services:   . Active Member of Clubs or Organizations:   . Attends Archivist Meetings:   Marland Kitchen Marital Status:   Intimate Partner Violence:   . Fear of Current or Ex-Partner:   . Emotionally Abused:   Marland Kitchen Physically Abused:   . Sexually Abused:      Physical Exam   Vitals:   06/22/19 2230 06/22/19 2300  BP: 121/63 120/68  Pulse:    Resp: 19 10    Temp:    SpO2:      CONSTITUTIONAL: Well-appearing, NAD NEURO:  Alert and oriented x 3, no focal deficits EYES:  eyes equal and reactive ENT/NECK:  no LAD, no JVD CARDIO: Regular rate, well-perfused, normal S1 and S2 PULM:  CTAB no wheezing or rhonchi GI/GU:  normal bowel sounds, non-distended, non-tender MSK/SPINE:  No gross deformities, no edema SKIN:  no rash, atraumatic PSYCH:  Appropriate speech and behavior  *Additional and/or pertinent findings included in MDM below  Diagnostic and Interventional Summary    EKG Interpretation  Date/Time:  Friday Jun 22 2019 16:23:51 EDT Ventricular Rate:  58 PR Interval:  156 QRS Duration: 124 QT Interval:  440 QTC Calculation: 431 R Axis:   -58 Text Interpretation: Sinus bradycardia with sinus arrhythmia Left anterior fascicular block Minimal voltage criteria for LVH, may be normal variant ( Cornell product ) Possible Lateral infarct , age undetermined Abnormal ECG Confirmed by Gerlene Fee [phone]) on 06/22/2019 8:09:29 PM      Labs Reviewed  BASIC METABOLIC PANEL - Abnormal; Notable for the following components:      Result Value   BUN 42 (*)    Creatinine, Ser 2.35 (*)    Calcium 7.9 (*)    GFR calc non Af Amer 21 (*)    GFR calc Af Amer 25 (*)    All other components within normal limits  CBC - Abnormal; Notable for the following components:   RBC 3.58 (*)    Hemoglobin 10.2 (*)    HCT 34.0 (*)    All other components within normal limits  TROPONIN I (HIGH SENSITIVITY) - Abnormal; Notable for the following components:   Troponin I (High Sensitivity) 46 (*)    All other components within normal limits  TROPONIN I (HIGH SENSITIVITY)    DG Chest 2 View  Final Result      Medications  sodium chloride flush (NS) 0.9 % injection 3 mL (has no administration in time range)     Procedures  /  Critical Care Procedures  ED Course and Medical Decision Making  I have reviewed the triage vital signs, the nursing notes, and  pertinent available records from the EMR.  Listed above are laboratory and imaging tests that I personally ordered, reviewed, and interpreted and then considered in my medical decision making (see below for details).      Chest pain without any associated symptoms, no chest pain for the past 4 or 5 hours, chest pain consistently  occurring after laying flat after a meal.  Suspect GI source.  EKG is reassuring, first troponin is minimally elevated in the setting of baseline CKD, awaiting repeat.  Recent stress test late last year was reassuring.  If second troponin flat, patient is appropriate for discharge and close cardiology follow-up.    Barth Kirks. Sedonia Small, [place] [email or web address]  Final Clinical Impressions(s) / ED Diagnoses     ICD-10-CM   1. Chest pain, unspecified type  R07.9     ED Discharge Orders    None       Discharge Instructions Discussed with and Provided to Patient:     Discharge Instructions     You were evaluated in the Emergency Department and after careful evaluation, we did not find any emergent condition requiring admission or further testing in the hospital.  Your exam/testing today is overall reassuring.  We recommend close follow-up with your cardiologist.  Please return to the Emergency Department if you experience any worsening of your condition.  We encourage you to follow up with a primary care provider.  Thank you for allowing [name] to be a part of your care.       Maudie Flakes, MD 06/22/19 [phone]

## 2019-06-22 NOTE — ED Triage Notes (Signed)
Pt arrives to ED with [facility] with complaints of sharp chest pain early this morning and this afternoon. Patient took a nitro and it relieved her chest pain. Patient is currently pain free but has hx of heart attack.

## 2019-06-23 LAB — TROPONIN I (HIGH SENSITIVITY): Troponin I (High Sensitivity): 46 ng/L — ABNORMAL HIGH (ref <18)

## 2019-06-25 ENCOUNTER — Other Ambulatory Visit: Payer: Self-pay

## 2019-06-26 ENCOUNTER — Ambulatory Visit (INDEPENDENT_AMBULATORY_CARE_PROVIDER_SITE_OTHER): Payer: Medicare Other | Admitting: Family Medicine

## 2019-06-26 ENCOUNTER — Encounter: Payer: Self-pay | Admitting: Family Medicine

## 2019-06-26 VITALS — BP 126/74 | HR 61 | Temp 97.6°F | Resp 16 | Ht 65.0 in | Wt 204.2 lb

## 2019-06-26 DIAGNOSIS — R778 Other specified abnormalities of plasma proteins: Secondary | ICD-10-CM

## 2019-06-26 DIAGNOSIS — R079 Chest pain, unspecified: Secondary | ICD-10-CM | POA: Diagnosis not present

## 2019-06-26 DIAGNOSIS — Z79899 Other long term (current) drug therapy: Secondary | ICD-10-CM | POA: Diagnosis not present

## 2019-06-26 DIAGNOSIS — F419 Anxiety disorder, unspecified: Secondary | ICD-10-CM

## 2019-06-26 MED ORDER — CITALOPRAM HYDROBROMIDE 10 MG PO TABS: 10.0000 mg | ORAL_TABLET | Freq: Every day | ORAL | 1 refills | Status: DC

## 2019-06-26 NOTE — Patient Instructions (Addendum)
A few things to remember from today's visit:  Chest pain did not seem to be related to indigestion. No changes today. Fill pill boxes sundays.  In regard to multivitamins I recommend Calcium 1000-1200 mg daily,ideally through your diet. Vit D 800 U .   If you need refills please call your pharmacy. Do not use My Chart to request refills or for acute issues that need immediate attention.    Please be sure medication list is accurate. If a new problem present, please set up appointment sooner than planned today.

## 2019-06-26 NOTE — Progress Notes (Signed)
Chief Complaint  Patient presents with  . Follow-up    4 week follow-up   HPI: [name] is a 64 y.o. female, who is here today to follow on recent ER visit and for chronic disease management.  She was last seen on 05/29/2019, when she was reporting worsening anxiety.  Citalopram 10 mg was started.She feels like medication is helping. She skipped some days and felt like anxiety was worse.  Her daughter is moving to [place] in a few weeks.  Her daughter has helped her wirth keeping up with medications. Sometimes she does not remember if she has taken medications, she is asking what will happen if she takes double dose of meds.  Since her last visit she has been evaluated in the ER due to chest pain, 06/22/2019. She woke up with left-sided sharp CP,initially under left breast and then upper and mid sternal. She took sl nitro and went to the ER.  No associated SOB,diaphoresis,or dizziness. No CP with exertion. Chest pain has resolved.  Lab Results  Component Value Date   CREATININE 2.35 (H) 06/22/2019   BUN 42 (H) 06/22/2019   NA 137 06/22/2019   K 4.2 06/22/2019   CL 103 06/22/2019   CO2 22 06/22/2019   Troponin x 2 elevated at 46.  Lab Results  Component Value Date   WBC 6.7 06/22/2019   HGB 10.2 (L) 06/22/2019   HCT 34.0 (L) 06/22/2019   MCV 95.0 06/22/2019   PLT 205 06/22/2019   CXR: Cardiomegaly without edema or infiltrate. She thinks pain was related to food she ate. She met with nutritionist yesterday. According to pt, it was recommended taking multi vit. She has not had nausea,vomiting,abdominal pain ,or changes in bowel habits.  Review of Systems  Constitutional: Positive for fatigue. Negative for activity change, appetite change and fever.  HENT: Negative for mouth sores, nosebleeds and sore throat.   Eyes: Negative for redness and visual disturbance.  Respiratory: Negative for cough and wheezing.   Cardiovascular: Negative for palpitations and  leg swelling.  Genitourinary: Negative for decreased urine volume and hematuria.  Musculoskeletal: Negative for gait problem and myalgias.  Neurological: Negative for syncope, weakness and headaches.  Psychiatric/Behavioral: Negative for confusion. The patient is nervous/anxious.   Rest see pertinent positives and negatives per HPI.  Current Outpatient Medications on File Prior to Visit  Medication Sig Dispense Refill  . acetaminophen (TYLENOL) 500 MG tablet Take 1,000 mg by mouth every 6 (six) hours as needed for headache.    Marland Kitchen apixaban (ELIQUIS) 5 MG TABS tablet Take 5 mg by mouth 2 (two) times daily.    . clopidogrel (PLAVIX) 75 MG tablet Take 75 mg by mouth daily.    Marland Kitchen gabapentin (NEURONTIN) 100 MG capsule Take 3 capsules (300 mg total) by mouth at bedtime. (Patient taking differently: Take 300 mg by mouth at bedtime as needed (foot pain). ) 90 capsule 0  . hydroxychloroquine (PLAQUENIL) 200 MG tablet Take 1 tablet (200 mg total) by mouth 2 (two) times daily. (Patient taking differently: Take 200 mg by mouth daily. ) 180 tablet 3  . lidocaine (LIDODERM) 5 % Place 1 patch onto the skin daily. Remove & Discard patch within 12 hours or as directed by MD 30 patch 6  . metoprolol succinate (TOPROL-XL) 50 MG 24 hr tablet Take 50 mg by mouth daily. Take with or immediately following a meal.    . mycophenolate (MYFORTIC) 180 MG EC tablet Take 180 mg by mouth  2 (two) times daily.    . Naphazoline HCl (CLEAR EYES OP) Place 1 drop into both eyes daily as needed (dry eyes).    . nitroGLYCERIN (NITROSTAT) 0.4 MG SL tablet Place 0.4 mg under the tongue every 5 (five) minutes as needed for chest pain.    . predniSONE (DELTASONE) 5 MG tablet Take 5 mg by mouth daily.    . rosuvastatin (CRESTOR) 5 MG tablet Take 5 mg by mouth daily at 6 PM.    . sulfamethoxazole-trimethoprim (BACTRIM) 400-80 MG tablet Take 1 tablet by mouth every Monday, Wednesday, and Friday. Take 1 tablet by mouth every Monday, Wednesday,  and Friday.    . tacrolimus (PROGRAF) 0.5 MG capsule Take 0.5 mg by mouth 2 (two) times daily.    Marland Kitchen torsemide (DEMADEX) 20 MG tablet Take 40 mg by mouth daily.    . valsartan (DIOVAN) 40 MG tablet Take 40 mg by mouth daily.     No current facility-administered medications on file prior to visit.     Past Medical History:  Diagnosis Date  . Anxiety    panic attack- talks herself and takes deep breathes  . Depression   . Dyspnea    with exertion   . ESRD (end stage renal disease) ([place])    hemo TTHSAT  . FSGS (focal segmental glomerulosclerosis) 2011   By renal biopsy  . Head injury    age 59  . History of kidney stones    kidney stone  . Hx of lupus nephritis 2011   by renal biopsy  . Lupus (systemic lupus erythematosus) ([name])    followed by Dr. Amil [name]  . Obesity   . Pericarditis    age 54ish  . Renal stone   . Secondary hyperparathyroidism of renal origin ([place])   . TIA (transient ischemic attack)    no residual effects   Allergies  Allergen Reactions  . Enalapril Maleate Anaphylaxis and Other (See Comments)    Throat swells  . Penicillins Other (See Comments)    Made patient lightheaded PATIENT HAS HAD A PCN REACTION WITH IMMEDIATE RASH, FACIAL/TONGUE/THROAT SWELLING, SOB, OR LIGHTHEADEDNESS WITH HYPOTENSION:  #  #  YES  #  #  Has patient had a PCN reaction causing severe rash involving mucus membranes or skin necrosis: No Has patient had a PCN reaction that required hospitalization: No Has patient had a PCN reaction occurring within the last 10 years: No If all of the above answers are "NO", then may proceed with Cephalosporin use.   . Chocolate Nausea And Vomiting  . Tape Itching, Rash and Other (See Comments)    Social History   Socioeconomic History  . Marital status: Legally Separated    Spouse name: Not on file  . Number of children: Not on file  . Years of education: Not on file  . Highest education level: Not on file  Occupational History  . Not on  file  Tobacco Use  . Smoking status: Never Smoker  . Smokeless tobacco: Never Used  Substance and Sexual Activity  . Alcohol use: No  . Drug use: No  . Sexual activity: Not Currently  Other Topics Concern  . Not on file  Social History Narrative  . Not on file   Social Determinants of Health   Financial Resource Strain:   . Difficulty of Paying Living Expenses:   Food Insecurity:   . Worried About Charity fundraiser in the Last Year:   . [employer]  in the Last Year:   Transportation Needs:   . Film/video editor (Medical):   Marland Kitchen Lack of Transportation (Non-Medical):   Physical Activity:   . Days of Exercise per Week:   . Minutes of Exercise per Session:   Stress:   . Feeling of Stress :   Social Connections:   . Frequency of Communication with Friends and Family:   . Frequency of Social Gatherings with Friends and Family:   . Attends Religious Services:   . Active Member of Clubs or Organizations:   . Attends Archivist Meetings:   Marland Kitchen Marital Status:    Vitals:   06/26/19 1012  BP: 126/74  Pulse: 61  Resp: 16  Temp: 97.6 F (36.4 C)  SpO2: 100%   Body mass index is 33.99 kg/m.  Physical Exam  Nursing note and vitals reviewed. Constitutional: She is oriented to person, place, and time. She appears well-developed. No distress.  HENT:  Head: Normocephalic and atraumatic.  Mouth/Throat: Oropharynx is clear and moist and mucous membranes are normal.  Eyes: Pupils are equal, round, and reactive to light. Conjunctivae are normal.  Cardiovascular: Normal rate and regular rhythm.  No murmur heard. Pulses:      Dorsalis pedis pulses are 2+ on the right side and 2+ on the left side.  Respiratory: Effort normal and breath sounds normal. No respiratory distress.  GI: Soft. She exhibits no mass. There is no hepatomegaly. There is no abdominal tenderness.  Musculoskeletal:        General: No edema.  Lymphadenopathy:    She has no cervical adenopathy.    Neurological: She is alert and oriented to person, place, and time. She has normal strength. No cranial nerve deficit. Gait normal.  Skin: Skin is warm. No rash noted. No erythema.  Psychiatric: She has a normal mood and affect.  Well groomed, good eye contact.    ASSESSMENT AND PLAN:  [name] was seen today for follow-up.  Diagnoses and all orders for this visit:  Chest pain, unspecified type We discussed possible causes. ? GERD. Continue GERD precautions. Instructed about warning signs.  Elevated troponin Stable. Most likely related to ESKD.  Anxiety disorder, unspecified type Problem has improved. No changes in current management. F/U ion 3-4 months,before if needed.  -     citalopram (CELEXA) 10 MG tablet; Take 1 tablet (10 mg total) by mouth at bedtime.  Medication management We discussed the importance of taking meds as instructed. She takes high risk meds as Eliquis,Plvix,Tacrolimus among others, so serious complications can develop if taking more than recommended. Recommend arranging all her meds in pill box  every Sunday for Am and Pm meds). If she feels like she cannot do it, we can arrange [facility] for meds check.   Return in about 3 months (around 09/26/2019).    Jenna Round G. [name], MD  [facility]. [place] office.   A few things to remember from today's visit:  Chest pain did not seem to be related to indigestion. No changes today. Fill pill boxes sundays.  In regard to multivitamins I recommend Calcium 1000-1200 mg daily,ideally through your diet. Vit D 800 U .   If you need refills please call your pharmacy. Do not use My Chart to request refills or for acute issues that need immediate attention.    Please be sure medication list is accurate. If a new problem present, please set up appointment sooner than planned today.

## 2019-07-02 ENCOUNTER — Ambulatory Visit: Payer: Medicare Other | Admitting: Family Medicine

## 2019-07-11 DIAGNOSIS — Z940 Kidney transplant status: Secondary | ICD-10-CM | POA: Diagnosis not present

## 2019-07-11 DIAGNOSIS — T8610 Unspecified complication of kidney transplant: Secondary | ICD-10-CM | POA: Diagnosis not present

## 2019-07-27 ENCOUNTER — Other Ambulatory Visit: Payer: Self-pay | Admitting: *Deleted

## 2019-07-27 DIAGNOSIS — N186 End stage renal disease: Secondary | ICD-10-CM

## 2019-07-27 DIAGNOSIS — R002 Palpitations: Secondary | ICD-10-CM | POA: Diagnosis not present

## 2019-07-27 DIAGNOSIS — I480 Paroxysmal atrial fibrillation: Secondary | ICD-10-CM | POA: Diagnosis not present

## 2019-07-27 DIAGNOSIS — I5022 Chronic systolic (congestive) heart failure: Secondary | ICD-10-CM | POA: Diagnosis not present

## 2019-07-27 DIAGNOSIS — Z992 Dependence on renal dialysis: Secondary | ICD-10-CM

## 2019-07-27 DIAGNOSIS — Z7902 Long term (current) use of antithrombotics/antiplatelets: Secondary | ICD-10-CM | POA: Diagnosis not present

## 2019-07-27 DIAGNOSIS — R001 Bradycardia, unspecified: Secondary | ICD-10-CM | POA: Diagnosis not present

## 2019-07-27 DIAGNOSIS — Z955 Presence of coronary angioplasty implant and graft: Secondary | ICD-10-CM | POA: Diagnosis not present

## 2019-07-27 DIAGNOSIS — I5020 Unspecified systolic (congestive) heart failure: Secondary | ICD-10-CM | POA: Diagnosis not present

## 2019-07-27 DIAGNOSIS — Z79899 Other long term (current) drug therapy: Secondary | ICD-10-CM | POA: Diagnosis not present

## 2019-07-27 DIAGNOSIS — I252 Old myocardial infarction: Secondary | ICD-10-CM | POA: Diagnosis not present

## 2019-07-27 DIAGNOSIS — I959 Hypotension, unspecified: Secondary | ICD-10-CM | POA: Diagnosis not present

## 2019-07-27 DIAGNOSIS — Z7901 Long term (current) use of anticoagulants: Secondary | ICD-10-CM | POA: Diagnosis not present

## 2019-07-31 ENCOUNTER — Other Ambulatory Visit: Payer: Self-pay

## 2019-07-31 ENCOUNTER — Ambulatory Visit (INDEPENDENT_AMBULATORY_CARE_PROVIDER_SITE_OTHER): Payer: Medicare Other | Admitting: Physician Assistant

## 2019-07-31 ENCOUNTER — Ambulatory Visit (HOSPITAL_COMMUNITY)
Admission: RE | Admit: 2019-07-31 | Discharge: 2019-07-31 | Disposition: A | Discharge status: Home / Self Care | Payer: Medicare Other | Source: Ambulatory Visit | Attending: Vascular Surgery | Admitting: Vascular Surgery

## 2019-07-31 VITALS — BP 97/59 | HR 54 | Temp 97.2°F | Resp 18 | Ht 65.0 in | Wt 204.0 lb

## 2019-07-31 DIAGNOSIS — T82848A Pain from vascular prosthetic devices, implants and grafts, initial encounter: Secondary | ICD-10-CM | POA: Diagnosis not present

## 2019-07-31 DIAGNOSIS — N186 End stage renal disease: Secondary | ICD-10-CM | POA: Diagnosis not present

## 2019-07-31 DIAGNOSIS — Z992 Dependence on renal dialysis: Secondary | ICD-10-CM | POA: Diagnosis not present

## 2019-07-31 DIAGNOSIS — R0989 Other specified symptoms and signs involving the circulatory and respiratory systems: Secondary | ICD-10-CM

## 2019-07-31 NOTE — Progress Notes (Signed)
HISTORY AND PHYSICAL     CC:  dialysis access Requesting Provider:  [name], Betty G, MD  HPI: This is a 64 y.o. female here for evaluation of her hemodialysis access.  She has hx of left bC AVF on 07/04/2017 by Dr. Donnetta Hutching and then superficialization with branch ligation on 11/30/2017 by Dr. Donzetta Matters.    She comes in today for evaluation of her fistula.  She states that she had some pain around the fistula just above the antecubital space and then it moved laterally.  This did improve and is no longer bothering her.  She states that recently, she woke up from sleep one night and her left 5th finger was numb and this got better.    She received a kidney transplant in February 2020 and she has not needed dialysis.  She states that when they first used the fistula, it was infiltrated and she had to get a catheter.  They were able to use the fistula a couple of months prior to the transplant.   She has not had any amaurosis fugax, facial droop, speech problems or weakness or loss of movement of any limb.   She states she has numbness in her feet and was started on neurontin and this has gotten better.   The pt is on a statin for cholesterol management.  The pt is not on a daily aspirin.  Other AC:  Plavix and Eliquis (hx of afib and atrial thrombus) The pt is on BB for hypertension.  The pt is not diabetic.   Tobacco hx:  never  Past Medical History:  Diagnosis Date  . Anxiety    panic attack- talks herself and takes deep breathes  . Depression   . Dyspnea    with exertion   . ESRD (end stage renal disease) ([place])    hemo TTHSAT  . FSGS (focal segmental glomerulosclerosis) 2011   By renal biopsy  . Head injury    age 59  . History of kidney stones    kidney stone  . Hx of lupus nephritis 2011   by renal biopsy  . Lupus (systemic lupus erythematosus) ([name])    followed by Dr. Amil Amen  . Obesity   . Pericarditis    age 41ish  . Renal stone   . Secondary hyperparathyroidism of renal  origin ([place])   . TIA (transient ischemic attack)    no residual effects    Past Surgical History:  Procedure Laterality Date  . AV FISTULA PLACEMENT Left 07/04/2017   Procedure: ARTERIOVENOUS (AV) FISTULA CREATION BRACHIOCEPHALIC;  Surgeon: [name], MD;  Location: [facility];  Service: Vascular;  Laterality: Left;  . COLONOSCOPY W/ POLYPECTOMY    . IR FLUORO GUIDE CV LINE RIGHT  06/28/2017  . IR US GUIDE VASC ACCESS RIGHT  06/28/2017  . KIDNEY SURGERY     kidney transplant 2020  . REVISION OF ARTERIOVENOUS GORETEX GRAFT Left 11/30/2017   Procedure: REVISION OF ARTERIOVENOUS FISTULA LEFT ARM Superfistulization and branch ligation.;  Surgeon: Waynetta Sandy, MD;  Location: [facility];  Service: Vascular;  Laterality: Left;    Allergies  Allergen Reactions  . Enalapril Maleate Anaphylaxis and Other (See Comments)    Throat swells  . Penicillins Other (See Comments)    Made patient lightheaded PATIENT HAS HAD A PCN REACTION WITH IMMEDIATE RASH, FACIAL/TONGUE/THROAT SWELLING, SOB, OR LIGHTHEADEDNESS WITH HYPOTENSION:  #  #  YES  #  #  Has patient had a PCN reaction causing severe rash  involving mucus membranes or skin necrosis: No Has patient had a PCN reaction that required hospitalization: No Has patient had a PCN reaction occurring within the last 10 years: No If all of the above answers are "NO", then may proceed with Cephalosporin use.   . Chocolate Nausea And Vomiting  . Tape Itching, Rash and Other (See Comments)    Current Outpatient Medications  Medication Sig Dispense Refill  . acetaminophen (TYLENOL) 500 MG tablet Take 1,000 mg by mouth every 6 (six) hours as needed for headache.    Marland Kitchen apixaban (ELIQUIS) 5 MG TABS tablet Take 5 mg by mouth 2 (two) times daily.    . citalopram (CELEXA) 10 MG tablet Take 1 tablet (10 mg total) by mouth at bedtime. 90 tablet 1  . clopidogrel (PLAVIX) 75 MG tablet Take 75 mg by mouth daily.    Marland Kitchen gabapentin (NEURONTIN) 100 MG capsule Take  3 capsules (300 mg total) by mouth at bedtime. (Patient taking differently: Take 300 mg by mouth at bedtime as needed (foot pain). ) 90 capsule 0  . hydroxychloroquine (PLAQUENIL) 200 MG tablet Take 1 tablet (200 mg total) by mouth 2 (two) times daily. (Patient taking differently: Take 200 mg by mouth daily. ) 180 tablet 3  . lidocaine (LIDODERM) 5 % Place 1 patch onto the skin daily. Remove & Discard patch within 12 hours or as directed by MD 30 patch 6  . metoprolol succinate (TOPROL-XL) 50 MG 24 hr tablet Take 50 mg by mouth daily. Take with or immediately following a meal.    . mycophenolate (MYFORTIC) 180 MG EC tablet Take 180 mg by mouth 2 (two) times daily.    . Naphazoline HCl (CLEAR EYES OP) Place 1 drop into both eyes daily as needed (dry eyes).    . nitroGLYCERIN (NITROSTAT) 0.4 MG SL tablet Place 0.4 mg under the tongue every 5 (five) minutes as needed for chest pain.    . predniSONE (DELTASONE) 5 MG tablet Take 5 mg by mouth daily.    . rosuvastatin (CRESTOR) 5 MG tablet Take 5 mg by mouth daily at 6 PM.    . sulfamethoxazole-trimethoprim (BACTRIM) 400-80 MG tablet Take 1 tablet by mouth every Monday, Wednesday, and Friday. Take 1 tablet by mouth every Monday, Wednesday, and Friday.    . tacrolimus (PROGRAF) 0.5 MG capsule Take 0.5 mg by mouth 2 (two) times daily.    Marland Kitchen torsemide (DEMADEX) 20 MG tablet Take 40 mg by mouth daily.    . valsartan (DIOVAN) 40 MG tablet Take 40 mg by mouth daily.     No current facility-administered medications for this visit.    Family History  Problem Relation Age of Onset  . Diabetes Mother   . Hypertension Father   . Cancer Sister   . Hypertension Brother     Social History   Socioeconomic History  . Marital status: Legally Separated    Spouse name: Not on file  . Number of children: Not on file  . Years of education: Not on file  . Highest education level: Not on file  Occupational History  . Not on file  Tobacco Use  . Smoking status:  Never Smoker  . Smokeless tobacco: Never Used  Vaping Use  . Vaping Use: Never used  Substance and Sexual Activity  . Alcohol use: No  . Drug use: No  . Sexual activity: Not Currently  Other Topics Concern  . Not on file  Social History Narrative  . Not on  file   Social Determinants of Health   Financial Resource Strain:   . Difficulty of Paying Living Expenses:   Food Insecurity:   . Worried About Charity fundraiser in the Last Year:   . Arboriculturist in the Last Year:   Transportation Needs:   . Film/video editor (Medical):   Marland Kitchen Lack of Transportation (Non-Medical):   Physical Activity:   . Days of Exercise per Week:   . Minutes of Exercise per Session:   Stress:   . Feeling of Stress :   Social Connections:   . Frequency of Communication with Friends and Family:   . Frequency of Social Gatherings with Friends and Family:   . Attends Religious Services:   . Active Member of Clubs or Organizations:   . Attends Archivist Meetings:   Marland Kitchen Marital Status:   Intimate Partner Violence:   . Fear of Current or Ex-Partner:   . Emotionally Abused:   Marland Kitchen Physically Abused:   . Sexually Abused:      ROS: [x]  Positive   [ ]  Negative   [ ]  All sytems reviewed and are negative  Cardiac: []  chest pain/pressure []  SOB [x]  DOE  Vascular: []  pain in legs while walking []  pain in feet when lying flat []  hx of DVT [x]  swelling in legs-sometimes  Pulmonary: []  asthma []  wheezing  Neurologic: []  weakness in []  arms []  legs [x]  numbness in []  arms [x]  feet [] difficulty speaking or slurred speech []  temporary loss of vision in one eye []  dizziness  Hematologic: []  bleeding problems  GI []  GERD  GU: []  CKD/renal failure  []  HD---[]  M/W/F []  T/T/S []  blood in urine  Psychiatric: []  hx of major depression  Integumentary: []  rashes []  ulcers  Constitutional: []  fever []  chills  PHYSICAL EXAMINATION:  Today's Vitals   07/31/19 1500  BP: (!)  97/59  Pulse: (!) 54  Resp: 18  Temp: (!) 97.2 F (36.2 C)  TempSrc: Temporal  SpO2: 100%  Weight: 204 lb (92.5 kg)  Height: 5\' 5"  (1.651 m)  PainSc: 0-No pain   Body mass index is 33.95 kg/m.    General:  WDWN female in NAD Gait: Not observed HENT: WNL Pulmonary: normal non-labored breathing , without Rales, rhonchi,  wheezing Cardiac: regular, without  Murmur with left faint carotid bruit Skin: without rashes, without ulcers  Vascular Exam/Pulses:  + palpable radial & DP pulses bilaterally; unable to palpate popliteal pulses Extremities:  Left BC AVF with excellent thrill; there are no sores; incisions have healed nicely.  Left hand is warm without and sores or ulcers.  Musculoskeletal: no muscle wasting or atrophy  Neurologic: A&O X 3; Speech is fluent/normal  Non-Invasive Vascular Imaging:   Fistula duplex 07/31/2019 +------------+----------+-------------+----------+--------+  OUTFLOW VEINPSV (cm/s)Diameter (cm)Depth (cm)Describe  +------------+----------+-------------+----------+--------+  Prox UA     83    1.05     0.24        +------------+----------+-------------+----------+--------+  Mid UA     72    1.20     0.31        +------------+----------+-------------+----------+--------+  Dist UA     82    1.42     0.14        +------------+----------+-------------+----------+--------+  AC Fossa    294    0.89     0.16        +------------+----------+-------------+----------+--------+    ASSESSMENT/PLAN: 64 y.o. female with working kidney transplant here for evaluation of her  hemodialysis access for pain   Kidney transplant: -pt with recent pain over fistula but this has resolved and she is not having any issues at this time.  discussed with pt that would not do anything at this time and we could re-evaluate if she has a recurrence of the pain.   Numbness in left 5th finger was  transient and most likely was from laying on arm.  Will continue to monitor this as well.   Left carotid bruit -pt with no sx of stroke.  She had a carotid duplex in 2015 that revealed 1-39% bilateral ICA stenosis.  There is a faint left carotid bruit heard-will get carotid duplex in the next couple of weeks and see her back with PA.  Discussed with her and her daughter (via telephone) should she develop any stroke sx, she should get to the ER.  She and her daughter both expressed understanding.   Neuropathy bilateral feet Improving with neurotin.  Pt has easily palpable DP pulses bilaterally.  There are no sores or ulcers present on her feet.    Leontine Locket, [facility] [phone]  Clinic MD:   Early

## 2019-08-02 ENCOUNTER — Other Ambulatory Visit: Payer: Self-pay | Admitting: *Deleted

## 2019-08-02 DIAGNOSIS — Z6832 Body mass index (BMI) 32.0-32.9, adult: Secondary | ICD-10-CM | POA: Diagnosis not present

## 2019-08-02 DIAGNOSIS — M546 Pain in thoracic spine: Secondary | ICD-10-CM | POA: Diagnosis not present

## 2019-08-02 DIAGNOSIS — M329 Systemic lupus erythematosus, unspecified: Secondary | ICD-10-CM | POA: Diagnosis not present

## 2019-08-02 DIAGNOSIS — F3289 Other specified depressive episodes: Secondary | ICD-10-CM | POA: Diagnosis not present

## 2019-08-02 DIAGNOSIS — E669 Obesity, unspecified: Secondary | ICD-10-CM | POA: Diagnosis not present

## 2019-08-02 DIAGNOSIS — M170 Bilateral primary osteoarthritis of knee: Secondary | ICD-10-CM | POA: Diagnosis not present

## 2019-08-02 DIAGNOSIS — R0989 Other specified symptoms and signs involving the circulatory and respiratory systems: Secondary | ICD-10-CM

## 2019-08-02 DIAGNOSIS — M25561 Pain in right knee: Secondary | ICD-10-CM | POA: Diagnosis not present

## 2019-08-02 DIAGNOSIS — M3214 Glomerular disease in systemic lupus erythematosus: Secondary | ICD-10-CM | POA: Diagnosis not present

## 2019-08-02 DIAGNOSIS — M150 Primary generalized (osteo)arthritis: Secondary | ICD-10-CM | POA: Diagnosis not present

## 2019-08-15 ENCOUNTER — Other Ambulatory Visit: Payer: Self-pay

## 2019-08-15 ENCOUNTER — Ambulatory Visit (INDEPENDENT_AMBULATORY_CARE_PROVIDER_SITE_OTHER): Payer: Medicare Other | Admitting: Physician Assistant

## 2019-08-15 ENCOUNTER — Ambulatory Visit (HOSPITAL_COMMUNITY)
Admission: RE | Admit: 2019-08-15 | Discharge: 2019-08-15 | Disposition: A | Discharge status: Home / Self Care | Payer: Medicare Other | Source: Ambulatory Visit | Attending: Vascular Surgery | Admitting: Vascular Surgery

## 2019-08-15 VITALS — BP 95/68 | HR 58 | Temp 96.6°F | Resp 20 | Ht 65.0 in | Wt 206.1 lb

## 2019-08-15 DIAGNOSIS — T82848A Pain from vascular prosthetic devices, implants and grafts, initial encounter: Secondary | ICD-10-CM | POA: Diagnosis not present

## 2019-08-15 DIAGNOSIS — R0989 Other specified symptoms and signs involving the circulatory and respiratory systems: Secondary | ICD-10-CM

## 2019-08-15 DIAGNOSIS — Z940 Kidney transplant status: Secondary | ICD-10-CM | POA: Diagnosis not present

## 2019-08-15 NOTE — Progress Notes (Signed)
Office Note     CC:  follow up Requesting Provider:  [name], Betty G, MD  HPI: Jenna [name] is a 64 y.o. (06/24/1955) female who presents for surveillance follow up for carotid stenosis. She was recently seen on 07/31/19 by Kallie Edward for follow up of Left AV fistula pain. She was noted at the time to have faint left carotid bruit on physical examination. She had not had carotid duplex evaluation since 2015 so she was scheduled to have repeat duplex examination  She presents today for duplex and to go over her results from her carotid study. She is without any TIA or stroke like symptoms. She has not had any amaurosis fugax, facial droop, speech problems or weakness or loss of movement of upper or lower extremities. She does have history of neuropathy for which she takes Neurontin  Over the past week she has developed recurrent left sided neck greater than left upper arm pain. History of recurrence not entirely clear however says that over past week it has been fairly constant. She is unable to describe the pain other than "just painful". She feels that she has swelling on left side of neck and around to back of neck. Sore to touch or move her head.She does not recall any injury. She says she tries not to sleep on her left side due to her fistula. She denies any pain in left forearm or hand. She does not have any weakness or numbness of her left upper extremity. She has been taking tylenol for pain  The pt is on a statin for cholesterol management.  The pt is not on a daily aspirin.  Other AC:  Plavix and Eliquis (hx of afib and atrial thrombus) The pt is on BB for hypertension.  The pt is not diabetic.   Tobacco hx:  never  Past Medical History:  Diagnosis Date  . Anxiety    panic attack- talks herself and takes deep breathes  . Depression   . Dyspnea    with exertion   . ESRD (end stage renal disease) ([place])    hemo TTHSAT  . FSGS (focal segmental glomerulosclerosis) 2011    By renal biopsy  . Head injury    age 54  . History of kidney stones    kidney stone  . Hx of lupus nephritis 2011   by renal biopsy  . Lupus (systemic lupus erythematosus) ([name])    followed by Dr. Amil Amen  . Obesity   . Pericarditis    age 40ish  . Renal stone   . Secondary hyperparathyroidism of renal origin ([place])   . TIA (transient ischemic attack)    no residual effects    Past Surgical History:  Procedure Laterality Date  . AV FISTULA PLACEMENT Left 07/04/2017   Procedure: ARTERIOVENOUS (AV) FISTULA CREATION BRACHIOCEPHALIC;  Surgeon: [name], MD;  Location: [facility];  Service: Vascular;  Laterality: Left;  . COLONOSCOPY W/ POLYPECTOMY    . IR FLUORO GUIDE CV LINE RIGHT  06/28/2017  . IR US GUIDE VASC ACCESS RIGHT  06/28/2017  . KIDNEY SURGERY     kidney transplant 2020  . REVISION OF ARTERIOVENOUS GORETEX GRAFT Left 11/30/2017   Procedure: REVISION OF ARTERIOVENOUS FISTULA LEFT ARM Superfistulization and branch ligation.;  Surgeon: Waynetta Sandy, MD;  Location: [facility];  Service: Vascular;  Laterality: Left;    Social History   Socioeconomic History  . Marital status: Legally Separated    Spouse name: Not on file  .  Number of children: Not on file  . Years of education: Not on file  . Highest education level: Not on file  Occupational History  . Not on file  Tobacco Use  . Smoking status: Never Smoker  . Smokeless tobacco: Never Used  Vaping Use  . Vaping Use: Never used  Substance and Sexual Activity  . Alcohol use: No  . Drug use: No  . Sexual activity: Not Currently  Other Topics Concern  . Not on file  Social History Narrative  . Not on file   Social Determinants of Health   Financial Resource Strain:   . Difficulty of Paying Living Expenses:   Food Insecurity:   . Worried About Charity fundraiser in the Last Year:   . Arboriculturist in the Last Year:   Transportation Needs:   . Film/video editor (Medical):   Marland Kitchen Lack of  Transportation (Non-Medical):   Physical Activity:   . Days of Exercise per Week:   . Minutes of Exercise per Session:   Stress:   . Feeling of Stress :   Social Connections:   . Frequency of Communication with Friends and Family:   . Frequency of Social Gatherings with Friends and Family:   . Attends Religious Services:   . Active Member of Clubs or Organizations:   . Attends Archivist Meetings:   Marland Kitchen Marital Status:   Intimate Partner Violence:   . Fear of Current or Ex-Partner:   . Emotionally Abused:   Marland Kitchen Physically Abused:   . Sexually Abused:     Family History  Problem Relation Age of Onset  . Diabetes Mother   . Hypertension Father   . Cancer Sister   . Hypertension Brother     Current Outpatient Medications  Medication Sig Dispense Refill  . acetaminophen (TYLENOL) 500 MG tablet Take 1,000 mg by mouth every 6 (six) hours as needed for headache.    Marland Kitchen apixaban (ELIQUIS) 5 MG TABS tablet Take 5 mg by mouth 2 (two) times daily.    . citalopram (CELEXA) 10 MG tablet Take 1 tablet (10 mg total) by mouth at bedtime. 90 tablet 1  . clopidogrel (PLAVIX) 75 MG tablet Take 75 mg by mouth daily.    Marland Kitchen gabapentin (NEURONTIN) 100 MG capsule Take 3 capsules (300 mg total) by mouth at bedtime. (Patient taking differently: Take 300 mg by mouth at bedtime as needed (foot pain). ) 90 capsule 0  . hydroxychloroquine (PLAQUENIL) 200 MG tablet Take 1 tablet (200 mg total) by mouth 2 (two) times daily. (Patient taking differently: Take 200 mg by mouth daily. ) 180 tablet 3  . lidocaine (LIDODERM) 5 % Place 1 patch onto the skin daily. Remove & Discard patch within 12 hours or as directed by MD 30 patch 6  . metoprolol succinate (TOPROL-XL) 50 MG 24 hr tablet Take 50 mg by mouth daily. Take with or immediately following a meal.    . mycophenolate (MYFORTIC) 180 MG EC tablet Take 180 mg by mouth 2 (two) times daily.    . Naphazoline HCl (CLEAR EYES OP) Place 1 drop into both eyes daily  as needed (dry eyes).    . nitroGLYCERIN (NITROSTAT) 0.4 MG SL tablet Place 0.4 mg under the tongue every 5 (five) minutes as needed for chest pain.    . predniSONE (DELTASONE) 5 MG tablet Take 5 mg by mouth daily.    . rosuvastatin (CRESTOR) 5 MG tablet Take 5 mg by  mouth daily at 6 PM.    . sulfamethoxazole-trimethoprim (BACTRIM) 400-80 MG tablet Take 1 tablet by mouth every Monday, Wednesday, and Friday. Take 1 tablet by mouth every Monday, Wednesday, and Friday.    . tacrolimus (PROGRAF) 0.5 MG capsule Take 0.5 mg by mouth 2 (two) times daily. 1.$RemoveBefore'5mg'EovlmVJOvLmhl$  in the am & $Re'1mg'tdv$  in pm    . torsemide (DEMADEX) 20 MG tablet Take 40 mg by mouth daily.    . valsartan (DIOVAN) 40 MG tablet Take 40 mg by mouth daily.     No current facility-administered medications for this visit.    Allergies  Allergen Reactions  . Enalapril Maleate Anaphylaxis and Other (See Comments)    Throat swells  . Penicillins Other (See Comments)    Made patient lightheaded PATIENT HAS HAD A PCN REACTION WITH IMMEDIATE RASH, FACIAL/TONGUE/THROAT SWELLING, SOB, OR LIGHTHEADEDNESS WITH HYPOTENSION:  #  #  YES  #  #  Has patient had a PCN reaction causing severe rash involving mucus membranes or skin necrosis: No Has patient had a PCN reaction that required hospitalization: No Has patient had a PCN reaction occurring within the last 10 years: No If all of the above answers are "NO", then may proceed with Cephalosporin use.   . Chocolate Nausea And Vomiting  . Tape Itching, Rash and Other (See Comments)     REVIEW OF SYSTEMS:  $RemoveB'[X]'makhFhrK$  denotes positive finding, $RemoveBeforeDEI'[ ]'diHjVEoJOedOhJZT$  denotes negative finding Cardiac  Comments:  Chest pain or chest pressure: X   Shortness of breath upon exertion: X   Short of breath when lying flat:    Irregular heart rhythm:        Vascular    Pain in calf, thigh, or hip brought on by ambulation:    Pain in feet at night that wakes you up from your sleep:     Blood clot in your veins:    Leg swelling:  X Left  greater than right, irregular swelling of mid left leg that is not tender      Pulmonary    Oxygen at home:    Productive cough:     Wheezing:         Neurologic    Sudden weakness in arms or legs:     Sudden numbness in arms or legs:     Sudden onset of difficulty speaking or slurred speech:    Temporary loss of vision in one eye:     Problems with dizziness:  X Usually going from sitting to standing or lying down to sitting position      Gastrointestinal    Blood in stool:     Vomited blood:         Genitourinary    Burning when urinating:     Blood in urine:        Psychiatric    Major depression:         Hematologic    Bleeding problems: X Nose bleeds and ear?  Problems with blood clotting too easily:        Skin    Rashes or ulcers:        Constitutional    Fever or chills:      PHYSICAL EXAMINATION:  Vitals:   08/15/19 1151  BP: 95/68  Pulse: (!) 58  Resp: 20  Temp: (!) 96.6 F (35.9 C)  TempSrc: Temporal  SpO2: 96%  Weight: 206 lb 1.6 oz (93.5 kg)  Height: $Remove'5\' 5"'pDMzcAr$  (1.651 m)    General:  WDWN in NAD; vital signs documented above Gait: Normal HENT: WNL, normocephalic. She does have some fullness on the left supraclavicular region. This is very tender to palpate. No palpable mass or lymphadenopathy Pulmonary: normal non-labored breathing , without  wheezing Cardiac: regular HR, without  Murmurs without carotid bruit Abdomen: obese, soft, NT, no masses Vascular Exam/Pulses:2+ radial pulses bilaterally, 2+ DP pulses bilaterally. Feet warm and well perfused Extremities: without ischemic changes, without Gangrene , without cellulitis; without open wounds; Left BC AVF with good thrill. No soreness or tenderness overlying. No swelling. Left hand is warm and well perfused 5/5 grip strength Musculoskeletal: no muscle wasting or atrophy  Neurologic: A&O X 3;  No focal weakness or paresthesias are detected Psychiatric:  The pt has Normal affect.   Non-Invasive  Vascular Imaging:   08/15/19 VAS US Carotid Duplex Bilateral Summary:  Right Carotid: Velocities in the right ICA are consistent with a 1-39% stenosis.   Left Carotid: There is no evidence of stenosis in the left ICA.   Vertebrals: Bilateral vertebral arteries demonstrate antegrade flow.  Subclavians: Normal flow hemodynamics were seen in bilateral subclavian arteries. Low resistant left subclavian artery consistent with AVF.    ASSESSMENT/PLAN:: 64 y.o. female here for follow up of carotid stenosis. She had faint left carotid bruit on physical examination at time of her last visit. Her carotid duplex today shows Right ICA of 1-39% stenosis and left without any stenosis. She does not have any symptoms in regard to her carotid disease. - She will not need to have another duplex follow up of her carotid arteries for 5 years - She is s/p kidney transplant but she has a working left AV fistula. She is having recurrent left upper extremity pains which come and go. She is not having more constant left neck pain which I feel is likely musculoskeletal in nature . If this worsens or does not improve we can assess this further but fistula duplex at last visit looked good -She will continue current medical management of her neuropathy as discussed at her recent visit - She has had recent hypotension. I have recommended she follow up with PCP and or Cardiologist to review her medications. Also recommend she take a log of her blood pressure twice daily at home so that she can follow it - she will follow up as needed   Karoline Caldwell, PA-C [facility] [phone]  Clinic MD:  D. Fields

## 2019-09-05 DIAGNOSIS — Z79899 Other long term (current) drug therapy: Secondary | ICD-10-CM | POA: Diagnosis not present

## 2019-09-05 DIAGNOSIS — E785 Hyperlipidemia, unspecified: Secondary | ICD-10-CM | POA: Diagnosis not present

## 2019-09-05 DIAGNOSIS — N189 Chronic kidney disease, unspecified: Secondary | ICD-10-CM | POA: Diagnosis not present

## 2019-09-05 DIAGNOSIS — I129 Hypertensive chronic kidney disease with stage 1 through stage 4 chronic kidney disease, or unspecified chronic kidney disease: Secondary | ICD-10-CM | POA: Diagnosis not present

## 2019-09-05 DIAGNOSIS — Z940 Kidney transplant status: Secondary | ICD-10-CM | POA: Diagnosis not present

## 2019-09-05 DIAGNOSIS — N390 Urinary tract infection, site not specified: Secondary | ICD-10-CM | POA: Diagnosis not present

## 2019-09-05 DIAGNOSIS — I480 Paroxysmal atrial fibrillation: Secondary | ICD-10-CM | POA: Diagnosis not present

## 2019-09-05 DIAGNOSIS — D631 Anemia in chronic kidney disease: Secondary | ICD-10-CM | POA: Diagnosis not present

## 2019-09-05 DIAGNOSIS — I770 Arteriovenous fistula, acquired: Secondary | ICD-10-CM | POA: Diagnosis not present

## 2019-09-05 DIAGNOSIS — I2510 Atherosclerotic heart disease of native coronary artery without angina pectoris: Secondary | ICD-10-CM | POA: Diagnosis not present

## 2019-09-07 DIAGNOSIS — I2102 ST elevation (STEMI) myocardial infarction involving left anterior descending coronary artery: Secondary | ICD-10-CM | POA: Diagnosis not present

## 2019-09-07 DIAGNOSIS — I110 Hypertensive heart disease with heart failure: Secondary | ICD-10-CM | POA: Diagnosis not present

## 2019-09-07 DIAGNOSIS — I252 Old myocardial infarction: Secondary | ICD-10-CM | POA: Diagnosis not present

## 2019-09-07 DIAGNOSIS — I255 Ischemic cardiomyopathy: Secondary | ICD-10-CM | POA: Diagnosis not present

## 2019-09-07 DIAGNOSIS — Z7901 Long term (current) use of anticoagulants: Secondary | ICD-10-CM | POA: Diagnosis not present

## 2019-09-07 DIAGNOSIS — Z4822 Encounter for aftercare following kidney transplant: Secondary | ICD-10-CM | POA: Diagnosis not present

## 2019-09-07 DIAGNOSIS — Z8679 Personal history of other diseases of the circulatory system: Secondary | ICD-10-CM | POA: Diagnosis not present

## 2019-09-07 DIAGNOSIS — I480 Paroxysmal atrial fibrillation: Secondary | ICD-10-CM | POA: Diagnosis not present

## 2019-09-07 DIAGNOSIS — I513 Intracardiac thrombosis, not elsewhere classified: Secondary | ICD-10-CM | POA: Diagnosis not present

## 2019-09-07 DIAGNOSIS — Z7902 Long term (current) use of antithrombotics/antiplatelets: Secondary | ICD-10-CM | POA: Diagnosis not present

## 2019-09-07 DIAGNOSIS — Z79899 Other long term (current) drug therapy: Secondary | ICD-10-CM | POA: Diagnosis not present

## 2019-09-07 DIAGNOSIS — Z955 Presence of coronary angioplasty implant and graft: Secondary | ICD-10-CM | POA: Diagnosis not present

## 2019-09-07 DIAGNOSIS — Z940 Kidney transplant status: Secondary | ICD-10-CM | POA: Diagnosis not present

## 2019-09-07 DIAGNOSIS — I5020 Unspecified systolic (congestive) heart failure: Secondary | ICD-10-CM | POA: Diagnosis not present

## 2019-09-07 DIAGNOSIS — Z86718 Personal history of other venous thrombosis and embolism: Secondary | ICD-10-CM | POA: Diagnosis not present

## 2019-09-11 DIAGNOSIS — M79645 Pain in left finger(s): Secondary | ICD-10-CM | POA: Diagnosis not present

## 2019-09-11 DIAGNOSIS — M65352 Trigger finger, left little finger: Secondary | ICD-10-CM | POA: Diagnosis not present

## 2019-09-18 ENCOUNTER — Encounter: Payer: Self-pay | Admitting: Family Medicine

## 2019-09-18 ENCOUNTER — Ambulatory Visit (INDEPENDENT_AMBULATORY_CARE_PROVIDER_SITE_OTHER): Payer: Medicare Other | Admitting: Family Medicine

## 2019-09-18 ENCOUNTER — Other Ambulatory Visit: Payer: Self-pay

## 2019-09-18 VITALS — BP 128/72 | HR 69 | Temp 98.1°F | Wt 205.7 lb

## 2019-09-18 DIAGNOSIS — M25512 Pain in left shoulder: Secondary | ICD-10-CM

## 2019-09-18 DIAGNOSIS — M549 Dorsalgia, unspecified: Secondary | ICD-10-CM | POA: Diagnosis not present

## 2019-09-18 MED ORDER — METHYLPREDNISOLONE ACETATE 40 MG/ML IJ SUSP
40.0000 mg | Freq: Once | INTRAMUSCULAR | Status: AC
  Administered 2019-09-18: 40 mg via INTRALESIONAL

## 2019-09-18 MED ORDER — METHOCARBAMOL 500 MG PO TABS: 500.0000 mg | ORAL_TABLET | Freq: Three times a day (TID) | ORAL | 0 refills | Status: DC | PRN

## 2019-09-18 MED ORDER — METHYLPREDNISOLONE ACETATE 40 MG/ML IJ SUSP: 40.0000 mg | Freq: Once | INTRAMUSCULAR | Status: DC

## 2019-09-18 NOTE — Progress Notes (Signed)
Established Patient Office Visit  Subjective:  Patient ID: Jenna [name], female    DOB: 03/31/1955  Age: 64 y.o. MRN: [id number]  CC:  Chief Complaint  Patient presents with  . Shoulder Pain    left shoulder pain for 2 to 3 weeks     HPI Jenna [name] presents for predominately left upper back pain.  She initially complained of shoulder pain but after assessment this appears to be more left trapezius but mostly medial left scapula upper border.  She states she had this pain for about 3 weeks.  Denies any injury.  Pain is relatively constant.  Worse with movement.  She has tried topical rubs and Tylenol without relief along with some heat.  She denies any radiculitis symptoms.  She does have complicated past history has had history of end-stage renal disease and was on dialysis until she had kidney transplant little over a year ago.  She had acute MI last September.  Denies any recent chest pain.  She has fistula left upper extremity and had ultrasound assessment of that back in June which was unremarkable.  Denies any cough, fever, or any pleuritic pain.  Does take Eliquis twice daily  Past Medical History:  Diagnosis Date  . Anxiety    panic attack- talks herself and takes deep breathes  . Depression   . Dyspnea    with exertion   . ESRD (end stage renal disease) ([name])    hemo TTHSAT  . FSGS (focal segmental glomerulosclerosis) 2011   By renal biopsy  . Head injury    age 29  . History of kidney stones    kidney stone  . Hx of lupus nephritis 2011   by renal biopsy  . Lupus (systemic lupus erythematosus) ([name])    followed by Dr. Dierdre Forth  . Obesity   . Pericarditis    age 22ish  . Renal stone   . Secondary hyperparathyroidism of renal origin ([name])   . TIA (transient ischemic attack)    no residual effects    Past Surgical History:  Procedure Laterality Date  . AV FISTULA PLACEMENT Left 07/04/2017   Procedure: ARTERIOVENOUS (AV) FISTULA CREATION  BRACHIOCEPHALIC;  Surgeon: Larina Earthly, MD;  Location: [facility] OR;  Service: Vascular;  Laterality: Left;  . COLONOSCOPY W/ POLYPECTOMY    . IR FLUORO GUIDE CV LINE RIGHT  06/28/2017  . IR US GUIDE VASC ACCESS RIGHT  06/28/2017  . KIDNEY SURGERY     kidney transplant 2020  . REVISION OF ARTERIOVENOUS GORETEX GRAFT Left 11/30/2017   Procedure: REVISION OF ARTERIOVENOUS FISTULA LEFT ARM Superfistulization and branch ligation.;  Surgeon: Maeola Harman, MD;  Location: [facility] OR;  Service: Vascular;  Laterality: Left;    Family History  Problem Relation Age of Onset  . Diabetes Mother   . Hypertension Father   . Cancer Sister   . Hypertension Brother     Social History   Socioeconomic History  . Marital status: Legally Separated    Spouse name: Not on file  . Number of children: Not on file  . Years of education: Not on file  . Highest education level: Not on file  Occupational History  . Not on file  Tobacco Use  . Smoking status: Never Smoker  . Smokeless tobacco: Never Used  Vaping Use  . Vaping Use: Never used  Substance and Sexual Activity  . Alcohol use: No  . Drug use: No  . Sexual activity: Not Currently  Other Topics Concern  . Not on file  Social History Narrative  . Not on file   Social Determinants of Health   Financial Resource Strain:   . Difficulty of Paying Living Expenses:   Food Insecurity:   . Worried About Charity fundraiser in the Last Year:   . Arboriculturist in the Last Year:   Transportation Needs:   . Film/video editor (Medical):   Marland Kitchen Lack of Transportation (Non-Medical):   Physical Activity:   . Days of Exercise per Week:   . Minutes of Exercise per Session:   Stress:   . Feeling of Stress :   Social Connections:   . Frequency of Communication with Friends and Family:   . Frequency of Social Gatherings with Friends and Family:   . Attends Religious Services:   . Active Member of Clubs or Organizations:   . Attends Theatre manager Meetings:   Marland Kitchen Marital Status:   Intimate Partner Violence:   . Fear of Current or Ex-Partner:   . Emotionally Abused:   Marland Kitchen Physically Abused:   . Sexually Abused:     Outpatient Medications Prior to Visit  Medication Sig Dispense Refill  . acetaminophen (TYLENOL) 500 MG tablet Take 1,000 mg by mouth every 6 (six) hours as needed for headache.    Marland Kitchen apixaban (ELIQUIS) 5 MG TABS tablet Take 5 mg by mouth 2 (two) times daily.    . citalopram (CELEXA) 10 MG tablet Take 1 tablet (10 mg total) by mouth at bedtime. 90 tablet 1  . clopidogrel (PLAVIX) 75 MG tablet Take 75 mg by mouth daily.    Marland Kitchen gabapentin (NEURONTIN) 100 MG capsule Take 3 capsules (300 mg total) by mouth at bedtime. (Patient taking differently: Take 300 mg by mouth at bedtime as needed (foot pain). ) 90 capsule 0  . hydroxychloroquine (PLAQUENIL) 200 MG tablet Take 1 tablet (200 mg total) by mouth 2 (two) times daily. (Patient taking differently: Take 200 mg by mouth daily. ) 180 tablet 3  . lidocaine (LIDODERM) 5 % Place 1 patch onto the skin daily. Remove & Discard patch within 12 hours or as directed by MD 30 patch 6  . metoprolol succinate (TOPROL-XL) 50 MG 24 hr tablet Take 50 mg by mouth daily. Take with or immediately following a meal.    . mycophenolate (MYFORTIC) 180 MG EC tablet Take 180 mg by mouth 2 (two) times daily.    . Naphazoline HCl (CLEAR EYES OP) Place 1 drop into both eyes daily as needed (dry eyes).    . nitroGLYCERIN (NITROSTAT) 0.4 MG SL tablet Place 0.4 mg under the tongue every 5 (five) minutes as needed for chest pain.    . predniSONE (DELTASONE) 5 MG tablet Take 5 mg by mouth daily.    . rosuvastatin (CRESTOR) 5 MG tablet Take 5 mg by mouth daily at 6 PM.    . sulfamethoxazole-trimethoprim (BACTRIM) 400-80 MG tablet Take 1 tablet by mouth every Monday, Wednesday, and Friday. Take 1 tablet by mouth every Monday, Wednesday, and Friday.    . tacrolimus (PROGRAF) 0.5 MG capsule Take 0.5 mg by  mouth 2 (two) times daily. 1.$RemoveBefore'5mg'wtIZgmlcILQBb$  in the am & $Re'1mg'AeG$  in pm    . torsemide (DEMADEX) 20 MG tablet Take 40 mg by mouth daily.    . valsartan (DIOVAN) 40 MG tablet Take 40 mg by mouth daily.     No facility-administered medications prior to visit.    Allergies  Allergen Reactions  . Enalapril Maleate Anaphylaxis and Other (See Comments)    Throat swells  . Penicillins Other (See Comments)    Made patient lightheaded PATIENT HAS HAD A PCN REACTION WITH IMMEDIATE RASH, FACIAL/TONGUE/THROAT SWELLING, SOB, OR LIGHTHEADEDNESS WITH HYPOTENSION:  #  #  YES  #  #  Has patient had a PCN reaction causing severe rash involving mucus membranes or skin necrosis: No Has patient had a PCN reaction that required hospitalization: No Has patient had a PCN reaction occurring within the last 10 years: No If all of the above answers are "NO", then may proceed with Cephalosporin use.   . Chocolate Nausea And Vomiting  . Tape Itching, Rash and Other (See Comments)    ROS Review of Systems  Constitutional: Negative for chills and fever.  Respiratory: Negative for shortness of breath.   Cardiovascular: Negative for chest pain.  Musculoskeletal: Positive for back pain.  Neurological: Negative for numbness.      Objective:    Physical Exam Vitals reviewed.  Cardiovascular:     Rate and Rhythm: Normal rate.  Pulmonary:     Effort: Pulmonary effort is normal.     Breath sounds: Normal breath sounds.  Musculoskeletal:     Cervical back: Neck supple.     Comments: He has full range of motion left shoulder.  No localized tenderness.  She does have significant tenderness left upper back along the medial and superior border of the scapula.  There is a area about 2 x 2 cm that is very tender to palpation     BP 128/72 (BP Location: Left Arm, Patient Position: Sitting, Cuff Size: Normal)   Pulse 69   Temp 98.1 F (36.7 C) (Oral)   Wt 205 lb 11.2 oz (93.3 kg)   SpO2 95%   BMI 34.23 kg/m  Wt Readings from  Last 3 Encounters:  09/18/19 205 lb 11.2 oz (93.3 kg)  08/15/19 206 lb 1.6 oz (93.5 kg)  07/31/19 204 lb (92.5 kg)     Health Maintenance Due  Topic Date Due  . COVID-19 Vaccine (1) Never done  . INFLUENZA VACCINE  09/16/2019    There are no preventive care reminders to display for this patient.  Lab Results  Component Value Date   TSH 3.10 04/04/2019   Lab Results  Component Value Date   WBC 6.7 06/22/2019   HGB 10.2 (L) 06/22/2019   HCT 34.0 (L) 06/22/2019   MCV 95.0 06/22/2019   PLT 205 06/22/2019   Lab Results  Component Value Date   NA 137 06/22/2019   K 4.2 06/22/2019   CO2 22 06/22/2019   GLUCOSE 98 06/22/2019   BUN 42 (H) 06/22/2019   CREATININE 2.35 (H) 06/22/2019   BILITOT 0.3 05/23/2014   ALKPHOS 76 05/23/2014   AST 41 (H) 05/23/2014   ALT 28 05/23/2014   PROT 7.9 05/23/2014   ALBUMIN 2.9 (L) 07/05/2017   CALCIUM 7.9 (L) 06/22/2019   ANIONGAP 12 06/22/2019   Lab Results  Component Value Date   CHOL 124 05/23/2014   Lab Results  Component Value Date   HDL 27 (L) 05/23/2014   Lab Results  Component Value Date   LDLCALC 69 05/23/2014   Lab Results  Component Value Date   TRIG 138 05/23/2014   Lab Results  Component Value Date   CHOLHDL 4.6 05/23/2014   Lab Results  Component Value Date   HGBA1C 5.30 10/31/2014      Assessment & Plan:  3-week history of left upper back pain.  Suspect trigger point.  We discussed risk and benefits of injection and discussed the fact that she does have some increased risk of bruising because of her anticoagulation therapy.  We also discussed low risk of infection and patient consented.  We define the area of maximum tenderness left upper back and prepped this with Betadine.  Using 25-gauge 1 inch needle injected 2 cc of plain Xylocaine and 40 mg of Depo-Medrol after spraying ethyl chloride spray.  Patient tolerated well. Suggest that she try some icing 15 to 20 minutes every 2-3 hours tonight Touch  base if pain not improving over the next week  No orders of the defined types were placed in this encounter.   Follow-up: No follow-ups on file.    Carolann Littler, MD

## 2019-09-18 NOTE — Patient Instructions (Signed)
Trigger Point Injection Trigger points are areas where you have pain. A trigger point injection is a shot given in the trigger point to help relieve pain for a few days to a few months. Common places for trigger points include:  The neck.  The shoulders.  The upper back.  The lower back. A trigger point injection will not cure long-term (chronic) pain permanently. These injections do not always work for every person. For some people, they can help to relieve pain for a few days to a few months. Tell a health care provider about:  Any allergies you have.  All medicines you are taking, including vitamins, herbs, eye drops, creams, and over-the-counter medicines.  Any problems you or family members have had with anesthetic medicines.  Any blood disorders you have.  Any surgeries you have had.  Any medical conditions you have. What are the risks? Generally, this is a safe procedure. However, problems may occur, including:  Infection.  Bleeding or bruising.  Allergic reaction to the injected medicine.  Irritation of the skin around the injection site. What happens before the procedure? Ask your health care provider about:  Changing or stopping your regular medicines. This is especially important if you are taking diabetes medicines or blood thinners.  Taking medicines such as aspirin and ibuprofen. These medicines can thin your blood. Do not take these medicines unless your health care provider tells you to take them.  Taking over-the-counter medicines, vitamins, herbs, and supplements. What happens during the procedure?   Your health care provider will feel for trigger points. A marker may be used to circle the area for the injection.  The skin over the trigger point will be washed with a germ-killing (antiseptic) solution.  A thin needle is used for the injection. You may feel pain or a twitching feeling when the needle enters the trigger point.  A numbing solution may  be injected into the trigger point. Sometimes a medicine to keep down inflammation is also injected.  Your health care provider may move the needle around the area where the trigger point is located until the tightness and twitching goes away.  After the injection, your health care provider may put gentle pressure over the injection site.  The injection site will be covered with a bandage (dressing). The procedure may vary among health care providers and hospitals. What can I expect after treatment? After treatment, you may have:  Soreness and stiffness for 1-2 days.  A dressing. This can be taken off in a few hours or as told by your health care provider. Follow these instructions at home: Injection site care  Remove your dressing as told by your health care provider.  Check your injection site every day for signs of infection. Check for: ? Redness, swelling, or pain. ? Fluid or blood. ? Warmth. ? Pus or a bad smell. Managing pain, stiffness, and swelling  If directed, put ice on the affected area. ? Put ice in a plastic bag. ? Place a towel between your skin and the bag. ? Leave the ice on for 20 minutes, 2-3 times a day. General instructions  If you were asked to stop your regular medicines, ask your health care provider when you may start taking them again.  Return to your normal activities as told by your health care provider. Ask your health care provider what activities are safe for you.  Do not take baths, swim, or use a hot tub until your health care provider approves.  You may be asked to see an occupational or physical therapist for exercises that reduce muscle strain and stretch the area of the trigger point.  Keep all follow-up visits as told by your health care provider. This is important. Contact a health care provider if:  Your pain comes back, and it is worse than before the injection. You may need more injections.  You have chills or a fever.  The  injection site becomes more painful, red, swollen, or warm to the touch. Summary  A trigger point injection is a shot given in the trigger point to help relieve pain for a few days to a few months.  Common places for trigger point injections are the neck, shoulder, upper back, and lower back.  These injections do not always work for every person, but for some people, the injections can help to relieve pain for a few days to a few months.  Contact a health care provider if symptoms come back or they are worse than before treatment. Also, get help if the injection site becomes more painful, red, swollen, or warm to the touch. This information is not intended to replace advice given to you by your health care provider. Make sure you discuss any questions you have with your health care provider. Document Revised: 03/15/2018 Document Reviewed: 03/15/2018 Elsevier Patient Education  [place].

## 2019-09-25 NOTE — Patient Instructions (Addendum)
A few things to remember from today's visit:  Upper back pain on right side - Plan: Ambulatory referral to Physical Therapy  Hypertension with renal disease  Anxiety disorder, unspecified type - Plan: citalopram (CELEXA) 20 MG tablet  Numbness and tingling of foot - Plan: gabapentin (NEURONTIN) 300 MG capsule, Ambulatory referral to Neurology  Insomnia, unspecified type  Today we increased Gabapentin from 100 mg to 300 mg at night. Celexa increased from 10 mg to 20 mg daily.  Try to arrange appt with psychotherapist. Be careful with falls.  If you need refills please call your pharmacy. Do not use My Chart to request refills or for acute issues that need immediate attention.    Please be sure medication list is accurate. If a new problem present, please set up appointment sooner than planned today.   If we have ordered labs or studies at this visit, it can take up to 1-2 weeks for results and processing. IF results require follow up or explanation, we will call you with instructions. Clinically stable results will be released to your [facility]. If you have not heard from [name] or cannot find your results in [facility] in 2 weeks please contact our office at [phone].  If you are not yet signed up for [facility], please consider signing up.

## 2019-09-26 ENCOUNTER — Encounter: Payer: Self-pay | Admitting: Family Medicine

## 2019-09-26 ENCOUNTER — Ambulatory Visit (INDEPENDENT_AMBULATORY_CARE_PROVIDER_SITE_OTHER): Payer: Medicare Other | Admitting: Family Medicine

## 2019-09-26 ENCOUNTER — Other Ambulatory Visit: Payer: Self-pay

## 2019-09-26 VITALS — BP 120/72 | HR 62 | Temp 98.0°F | Resp 16 | Ht 65.0 in | Wt 205.5 lb

## 2019-09-26 DIAGNOSIS — F419 Anxiety disorder, unspecified: Secondary | ICD-10-CM

## 2019-09-26 DIAGNOSIS — M549 Dorsalgia, unspecified: Secondary | ICD-10-CM

## 2019-09-26 DIAGNOSIS — G4700 Insomnia, unspecified: Secondary | ICD-10-CM

## 2019-09-26 DIAGNOSIS — R200 Anesthesia of skin: Secondary | ICD-10-CM | POA: Diagnosis not present

## 2019-09-26 DIAGNOSIS — I129 Hypertensive chronic kidney disease with stage 1 through stage 4 chronic kidney disease, or unspecified chronic kidney disease: Secondary | ICD-10-CM | POA: Diagnosis not present

## 2019-09-26 DIAGNOSIS — R202 Paresthesia of skin: Secondary | ICD-10-CM | POA: Diagnosis not present

## 2019-09-26 MED ORDER — GABAPENTIN 300 MG PO CAPS: 300.0000 mg | ORAL_CAPSULE | Freq: Three times a day (TID) | ORAL | 3 refills | Status: DC

## 2019-09-26 MED ORDER — CITALOPRAM HYDROBROMIDE 20 MG PO TABS: 20.0000 mg | ORAL_TABLET | Freq: Every day | ORAL | 3 refills | Status: DC

## 2019-09-26 NOTE — Progress Notes (Signed)
HPI: Jenna [name] is a 64 y.o. female, who is here today for 3 months follow up.   She was last seen on 06/26/19. Since her last visit she has seen cardiologist. Evaluated for acute visit on 09/18/19, upper back pain.  Still having right upper back and shoulder pain. States that she received trigger point injections. It helps for a few days. No hx of trauma.  C/O toes burn, like on fire, intermittently and last about 30-60 min. 1-2 weeks ago she started with fingertip burning and numbness. 2/17/21she was c/o foot pain,constant toes numbness and tingling. Gabapentin 300 mg has helped. Tolerating medication well.  She is following with ortho for trigger finger she is supposed to have a month follow up, has not arranged appt.  HTN: Home BP: "good." She has not had chest pain, dyspnea, palpitation, claudication, or focal weakness. She is on Valsartan 40 mg and Metoprolol Succinate 50 mg daily.  ESKD s/p renal transplant.  Lab Results  Component Value Date   CREATININE 2.35 (H) 06/22/2019   BUN 42 (H) 06/22/2019   NA 137 06/22/2019   K 4.2 06/22/2019   CL 103 06/22/2019   CO2 22 06/22/2019   Anxiety: Last visit Celexa 10 mg was started. She has tolerated medication well but wonders if dose can be increased. She does not want to eat. She follows with nutritionist. She is so "tired" to cook.  She is also c/o insomnia, having trouble falling asleep and wakes up early. In average sleeping about 3 hours. She has not tried OTC medications.  Review of Systems  Constitutional: Positive for appetite change and fatigue. Negative for activity change and fever.  HENT: Negative for mouth sores, nosebleeds and sore throat.   Eyes: Negative for redness and visual disturbance.  Respiratory: Negative for cough and wheezing.   Cardiovascular: Negative for leg swelling.  Gastrointestinal: Negative for abdominal pain, nausea and vomiting.       Negative for changes in bowel  habits.  Genitourinary: Negative for decreased urine volume, dysuria and hematuria.  Musculoskeletal: Positive for arthralgias. Negative for gait problem.  Neurological: Negative for syncope and headaches.  Psychiatric/Behavioral: Negative for confusion. The patient is nervous/anxious.   Rest of ROS, see pertinent positives sand negatives in HPI  Current Outpatient Medications on File Prior to Visit  Medication Sig Dispense Refill  . acetaminophen (TYLENOL) 500 MG tablet Take 1,000 mg by mouth every 6 (six) hours as needed for headache.    Marland Kitchen apixaban (ELIQUIS) 5 MG TABS tablet Take 5 mg by mouth 2 (two) times daily.    . clopidogrel (PLAVIX) 75 MG tablet Take 75 mg by mouth daily.    . hydroxychloroquine (PLAQUENIL) 200 MG tablet Take 1 tablet (200 mg total) by mouth 2 (two) times daily. (Patient taking differently: Take 200 mg by mouth daily. ) 180 tablet 3  . lidocaine (LIDODERM) 5 % Place 1 patch onto the skin daily. Remove & Discard patch within 12 hours or as directed by MD 30 patch 6  . metoprolol succinate (TOPROL-XL) 50 MG 24 hr tablet Take 50 mg by mouth daily. Take with or immediately following a meal.    . mycophenolate (MYFORTIC) 180 MG EC tablet Take 180 mg by mouth 2 (two) times daily.    . Naphazoline HCl (CLEAR EYES OP) Place 1 drop into both eyes daily as needed (dry eyes).    . nitroGLYCERIN (NITROSTAT) 0.4 MG SL tablet Place 0.4 mg under the tongue every  5 (five) minutes as needed for chest pain.    . predniSONE (DELTASONE) 5 MG tablet Take 5 mg by mouth daily.    . rosuvastatin (CRESTOR) 5 MG tablet Take 5 mg by mouth daily at 6 PM.    . sulfamethoxazole-trimethoprim (BACTRIM) 400-80 MG tablet Take 1 tablet by mouth every Monday, Wednesday, and Friday. Take 1 tablet by mouth every Monday, Wednesday, and Friday.    . tacrolimus (PROGRAF) 0.5 MG capsule Take 0.5 mg by mouth 2 (two) times daily. 1.$RemoveBefore'5mg'OmgUMvlEMiPdG$  in the am & $Re'1mg'gdZ$  in pm    . torsemide (DEMADEX) 20 MG tablet Take 40 mg by  mouth daily.    . valsartan (DIOVAN) 40 MG tablet Take 40 mg by mouth daily.     No current facility-administered medications on file prior to visit.   Past Medical History:  Diagnosis Date  . Anxiety    panic attack- talks herself and takes deep breathes  . Depression   . Dyspnea    with exertion   . ESRD (end stage renal disease) ([place])    hemo TTHSAT  . FSGS (focal segmental glomerulosclerosis) 2011   By renal biopsy  . Head injury    age 34  . History of kidney stones    kidney stone  . Hx of lupus nephritis 2011   by renal biopsy  . Lupus (systemic lupus erythematosus) ([name])    followed by Dr. Amil [name]  . Obesity   . Pericarditis    age 38ish  . Renal stone   . Secondary hyperparathyroidism of renal origin ([place])   . TIA (transient ischemic attack)    no residual effects   Allergies  Allergen Reactions  . Enalapril Maleate Anaphylaxis and Other (See Comments)    Throat swells  . Penicillins Other (See Comments)    Made patient lightheaded PATIENT HAS HAD A PCN REACTION WITH IMMEDIATE RASH, FACIAL/TONGUE/THROAT SWELLING, SOB, OR LIGHTHEADEDNESS WITH HYPOTENSION:  #  #  YES  #  #  Has patient had a PCN reaction causing severe rash involving mucus membranes or skin necrosis: No Has patient had a PCN reaction that required hospitalization: No Has patient had a PCN reaction occurring within the last 10 years: No If all of the above answers are "NO", then may proceed with Cephalosporin use.   . Chocolate Nausea And Vomiting  . Tape Itching, Rash and Other (See Comments)    Social History   Socioeconomic History  . Marital status: Legally Separated    Spouse name: Not on file  . Number of children: Not on file  . Years of education: Not on file  . Highest education level: Not on file  Occupational History  . Not on file  Tobacco Use  . Smoking status: Never Smoker  . Smokeless tobacco: Never Used  Vaping Use  . Vaping Use: Never used  Substance and Sexual  Activity  . Alcohol use: No  . Drug use: No  . Sexual activity: Not Currently  Other Topics Concern  . Not on file  Social History Narrative  . Not on file   Social Determinants of Health   Financial Resource Strain:   . Difficulty of Paying Living Expenses:   Food Insecurity:   . Worried About Charity fundraiser in the Last Year:   . Arboriculturist in the Last Year:   Transportation Needs:   . Film/video editor (Medical):   Marland Kitchen Lack of Transportation (Non-Medical):   Physical Activity:   .  Days of Exercise per Week:   . Minutes of Exercise per Session:   Stress:   . Feeling of Stress :   Social Connections:   . Frequency of Communication with Friends and Family:   . Frequency of Social Gatherings with Friends and Family:   . Attends Religious Services:   . Active Member of Clubs or Organizations:   . Attends Archivist Meetings:   Marland Kitchen Marital Status:     Vitals:   09/26/19 1027  BP: 120/72  Pulse: 62  Resp: 16  Temp: 98 F (36.7 C)  SpO2: 95%   Body mass index is 34.2 kg/m.  Physical Exam Vitals and nursing note reviewed.  Constitutional:      General: She is not in acute distress.    Appearance: She is well-developed.  HENT:     Head: Normocephalic and atraumatic.     Mouth/Throat:     Mouth: Mucous membranes are moist.     Pharynx: Oropharynx is clear.  Eyes:     Conjunctiva/sclera: Conjunctivae normal.  Cardiovascular:     Rate and Rhythm: Normal rate and regular rhythm.     Pulses:          Dorsalis pedis pulses are 2+ on the right side and 2+ on the left side.     Heart sounds: Murmur (? Soft SEM RUSB) heard.   Pulmonary:     Effort: Pulmonary effort is normal. No respiratory distress.     Breath sounds: Normal breath sounds.  Abdominal:     Palpations: Abdomen is soft. There is no hepatomegaly or mass.     Tenderness: There is no abdominal tenderness.  Lymphadenopathy:     Cervical: No cervical adenopathy.  Skin:    General:  Skin is warm.     Findings: No erythema or rash.  Neurological:     Mental Status: She is alert and oriented to person, place, and time.     Cranial Nerves: No cranial nerve deficit.     Gait: Gait normal.  Psychiatric:     Comments: Well groomed, good eye contact.   ASSESSMENT AND PLAN:  Jenna [name] was seen today for 3 months follow-up.  Orders Placed This Encounter  Procedures  . Ambulatory referral to Neurology  . Ambulatory referral to Physical Therapy    Insomnia, unspecified type We discussed some pharmacologic options and side effects. For now I recommend good sleep hygiene. Could be related with anxiety therefore increasing Celexa dose may help.  Hypertension with renal disease BP adequately controlled. No changes in current managements.  Upper back pain on right side Problem is not well controlled. Local massage may help. PT will be arranged.  Anxiety disorder, unspecified type Still not well controlled. Celexa dose increased from 10 mg to 20 mg. Instructed about warning signs. She will also benefit from CBT.  -     citalopram (CELEXA) 20 MG tablet; Take 1 tablet (20 mg total) by mouth daily.  Numbness and tingling of foot Now involving hands. We discussed possible etiologies. Gabapentin 300 mg at bedtime to continue. Instructed about warning signs. Appt with neurologist will be arranged.  -     gabapentin (NEURONTIN) 300 MG capsule; Take 1 capsule (300 mg total) by mouth 3 (three) times daily.   Spent 43 minutes with Jenna [name], in which time history was obtained and documented, examination was performed, prior lab results reviewed. We discussed plan,treatment options,and some side effects of medications.   Return  in about 2 months (around 11/26/2019) for Insomnia, anxiety..   Carlton Buskey G. [name], MD  [facility]. [place] office.   A few things to remember from today's visit:  Upper back pain on right side - Plan: Ambulatory  referral to Physical Therapy  Hypertension with renal disease  Anxiety disorder, unspecified type - Plan: citalopram (CELEXA) 20 MG tablet  Numbness and tingling of foot - Plan: gabapentin (NEURONTIN) 300 MG capsule, Ambulatory referral to Neurology  Insomnia, unspecified type  Today we increased Gabapentin from 100 mg to 300 mg at night. Celexa increased from 10 mg to 20 mg daily.  Try to arrange appt with psychotherapist. Be careful with falls.  If you need refills please call your pharmacy. Do not use My Chart to request refills or for acute issues that need immediate attention.    Please be sure medication list is accurate. If a new problem present, please set up appointment sooner than planned today.   If we have ordered labs or studies at this visit, it can take up to 1-2 weeks for results and processing. IF results require follow up or explanation, we will call you with instructions. Clinically stable results will be released to your [facility]. If you have not heard from [name] or cannot find your results in [facility] in 2 weeks please contact our office at [phone].  If you are not yet signed up for [facility], please consider signing up.

## 2019-10-11 DIAGNOSIS — F4310 Post-traumatic stress disorder, unspecified: Secondary | ICD-10-CM | POA: Diagnosis not present

## 2019-10-11 DIAGNOSIS — F411 Generalized anxiety disorder: Secondary | ICD-10-CM | POA: Diagnosis not present

## 2019-10-17 ENCOUNTER — Other Ambulatory Visit: Payer: Self-pay

## 2019-10-18 ENCOUNTER — Ambulatory Visit: Payer: Medicare Other | Admitting: Family Medicine

## 2019-10-19 ENCOUNTER — Other Ambulatory Visit: Payer: Self-pay

## 2019-10-24 ENCOUNTER — Ambulatory Visit: Payer: Medicare Other | Admitting: Family Medicine

## 2019-10-25 DIAGNOSIS — M79645 Pain in left finger(s): Secondary | ICD-10-CM | POA: Diagnosis not present

## 2019-10-25 DIAGNOSIS — M65352 Trigger finger, left little finger: Secondary | ICD-10-CM | POA: Diagnosis not present

## 2019-10-26 DIAGNOSIS — I2510 Atherosclerotic heart disease of native coronary artery without angina pectoris: Secondary | ICD-10-CM | POA: Diagnosis not present

## 2019-10-26 DIAGNOSIS — Z7901 Long term (current) use of anticoagulants: Secondary | ICD-10-CM | POA: Diagnosis not present

## 2019-10-26 DIAGNOSIS — I951 Orthostatic hypotension: Secondary | ICD-10-CM | POA: Diagnosis not present

## 2019-10-26 DIAGNOSIS — Z9861 Coronary angioplasty status: Secondary | ICD-10-CM | POA: Diagnosis not present

## 2019-10-26 DIAGNOSIS — I110 Hypertensive heart disease with heart failure: Secondary | ICD-10-CM | POA: Diagnosis not present

## 2019-10-26 DIAGNOSIS — Z955 Presence of coronary angioplasty implant and graft: Secondary | ICD-10-CM | POA: Diagnosis not present

## 2019-10-26 DIAGNOSIS — Z940 Kidney transplant status: Secondary | ICD-10-CM | POA: Diagnosis not present

## 2019-10-26 DIAGNOSIS — I513 Intracardiac thrombosis, not elsewhere classified: Secondary | ICD-10-CM | POA: Diagnosis not present

## 2019-10-26 DIAGNOSIS — I5020 Unspecified systolic (congestive) heart failure: Secondary | ICD-10-CM | POA: Diagnosis not present

## 2019-10-26 DIAGNOSIS — I252 Old myocardial infarction: Secondary | ICD-10-CM | POA: Diagnosis not present

## 2019-10-27 DIAGNOSIS — I491 Atrial premature depolarization: Secondary | ICD-10-CM | POA: Diagnosis not present

## 2019-10-30 DIAGNOSIS — F419 Anxiety disorder, unspecified: Secondary | ICD-10-CM | POA: Diagnosis not present

## 2019-10-30 DIAGNOSIS — Z91411 Personal history of adult psychological abuse: Secondary | ICD-10-CM | POA: Diagnosis not present

## 2019-10-30 DIAGNOSIS — Z79899 Other long term (current) drug therapy: Secondary | ICD-10-CM | POA: Diagnosis not present

## 2019-10-30 DIAGNOSIS — Z91410 Personal history of adult physical and sexual abuse: Secondary | ICD-10-CM | POA: Diagnosis not present

## 2019-11-09 DIAGNOSIS — Z9861 Coronary angioplasty status: Secondary | ICD-10-CM | POA: Diagnosis not present

## 2019-11-09 DIAGNOSIS — I110 Hypertensive heart disease with heart failure: Secondary | ICD-10-CM | POA: Diagnosis not present

## 2019-11-09 DIAGNOSIS — I5020 Unspecified systolic (congestive) heart failure: Secondary | ICD-10-CM | POA: Diagnosis not present

## 2019-11-09 DIAGNOSIS — I513 Intracardiac thrombosis, not elsewhere classified: Secondary | ICD-10-CM | POA: Diagnosis not present

## 2019-11-09 DIAGNOSIS — I2510 Atherosclerotic heart disease of native coronary artery without angina pectoris: Secondary | ICD-10-CM | POA: Diagnosis not present

## 2019-11-09 DIAGNOSIS — I480 Paroxysmal atrial fibrillation: Secondary | ICD-10-CM | POA: Diagnosis not present

## 2019-11-15 ENCOUNTER — Ambulatory Visit: Payer: Medicare Other | Admitting: Physical Therapy

## 2019-11-20 ENCOUNTER — Ambulatory Visit: Payer: Medicare Other

## 2019-11-26 ENCOUNTER — Other Ambulatory Visit: Payer: Self-pay

## 2019-11-26 DIAGNOSIS — N184 Chronic kidney disease, stage 4 (severe): Secondary | ICD-10-CM | POA: Diagnosis not present

## 2019-11-27 ENCOUNTER — Ambulatory Visit (INDEPENDENT_AMBULATORY_CARE_PROVIDER_SITE_OTHER): Payer: Medicare Other | Admitting: Family Medicine

## 2019-11-27 ENCOUNTER — Encounter: Payer: Self-pay | Admitting: Family Medicine

## 2019-11-27 ENCOUNTER — Ambulatory Visit (INDEPENDENT_AMBULATORY_CARE_PROVIDER_SITE_OTHER)
Admission: RE | Admit: 2019-11-27 | Discharge: 2019-11-27 | Disposition: A | Discharge status: Home / Self Care | Payer: Medicare Other | Source: Ambulatory Visit | Attending: Family Medicine | Admitting: Family Medicine

## 2019-11-27 VITALS — BP 100/62 | HR 70 | Temp 98.3°F | Resp 16 | Ht 65.0 in | Wt 206.5 lb

## 2019-11-27 DIAGNOSIS — F419 Anxiety disorder, unspecified: Secondary | ICD-10-CM

## 2019-11-27 DIAGNOSIS — W19XXXA Unspecified fall, initial encounter: Secondary | ICD-10-CM | POA: Diagnosis not present

## 2019-11-27 DIAGNOSIS — M533 Sacrococcygeal disorders, not elsewhere classified: Secondary | ICD-10-CM | POA: Diagnosis not present

## 2019-11-27 DIAGNOSIS — I129 Hypertensive chronic kidney disease with stage 1 through stage 4 chronic kidney disease, or unspecified chronic kidney disease: Secondary | ICD-10-CM

## 2019-11-27 DIAGNOSIS — I700 Atherosclerosis of aorta: Secondary | ICD-10-CM | POA: Diagnosis not present

## 2019-11-27 DIAGNOSIS — Z23 Encounter for immunization: Secondary | ICD-10-CM | POA: Diagnosis not present

## 2019-11-27 DIAGNOSIS — Y92009 Unspecified place in unspecified non-institutional (private) residence as the place of occurrence of the external cause: Secondary | ICD-10-CM

## 2019-11-27 DIAGNOSIS — G629 Polyneuropathy, unspecified: Secondary | ICD-10-CM | POA: Diagnosis not present

## 2019-11-27 DIAGNOSIS — R42 Dizziness and giddiness: Secondary | ICD-10-CM

## 2019-11-27 DIAGNOSIS — R58 Hemorrhage, not elsewhere classified: Secondary | ICD-10-CM

## 2019-11-27 MED ORDER — VALSARTAN 40 MG PO TABS: 20.0000 mg | ORAL_TABLET | Freq: Every day | ORAL | 1 refills | Status: DC

## 2019-11-27 NOTE — Progress Notes (Signed)
HPI: Jenna [name] is a pleasant  64 y.o. female with hx of CAD,ESRD,HTN, and atrial fib here today for follow up.   She was last seen on 09/26/19. No new problems since her last visit.  She has had some dizziness today, which she attributes to not eating breakfast. She has occasional episodes of dizziness intermittently for a while.  No associated CP, dyspnea, palpitations, or MS changes. HTN: BP reading at home mildly low, 84/50's. She is on Diovan 40 mg daily and Metoprolol succinate 50 mg daily. She is drinking adequate amount of fluids.  About 2 weeks ago she had 3 days of diarrhea and again 3 days ago. No associated abdominal pain, nausea, vomiting. Negative for sick contact.  ESRD, s/p renal transplant. She saw her nephrologist yesterday and blood work was done. She is not sure which labs were ordered but mentions that 5 tubes were drown.  She has also seen ortho for left thumb pain.  Anxiety: Last visit Celexa was increased from 10 mg to 20 mg.  States that she is feeling better, and dealing better with the stress. She is excited about her daughter's wedding in 01/2020. She is helping her with trying dresses and has all the pictures in her phone,she shares with me today.  Started CBT and she has a video visit tomorrow with psychologist. Negative for suicidal thoughts.  She is also concerned about bruising. Usually not tender, some with no known hx of trauma. She is on Eliquis 5 mg twice daily. She has not noted nose/gum,blood in stool,melena,or gross hematuria.  She had a fall about 10 days ago, she "hurt" her "tail bone." Her brother helped her get up. No head trauma.  She fell on carpet. Still having severe pain, exacerbated by sitting on flat surfaces and palpation. She was in her bedroom, landed seated, she has carpet. Pain started a few minutes after fall.  Pain is now severe, exacerbated by sitting and palpation. Alleviated when standing  up.  Peripheral neuropathy: Feet numbness and tingling has improved with gabapentin 300 mg. She forgets to take medication sometimes. Sleeping better. Still having some discomfort. She has not noted focal weakness or skin rash. Pending appointment with neurologist. Negative for new associated symptom.  Review of Systems  Constitutional: Negative for activity change, appetite change, fatigue and fever.  HENT: Negative for mouth sores and sore throat.   Eyes: Negative for redness and visual disturbance.  Respiratory: Negative for cough and wheezing.   Cardiovascular: Negative for leg swelling.  Genitourinary: Negative for decreased urine volume, dysuria and hematuria.  Musculoskeletal: Positive for arthralgias and back pain. Negative for myalgias.  Skin: Negative for pallor and wound.  Neurological: Negative for syncope and headaches.  Psychiatric/Behavioral: Negative for confusion. The patient is nervous/anxious.   Rest of ROS, see pertinent positives sand negatives in HPI  Current Outpatient Medications on File Prior to Visit  Medication Sig Dispense Refill  . acetaminophen (TYLENOL) 500 MG tablet Take 1,000 mg by mouth every 6 (six) hours as needed for headache.    Marland Kitchen apixaban (ELIQUIS) 5 MG TABS tablet Take 5 mg by mouth 2 (two) times daily.    Marland Kitchen aspirin 81 MG EC tablet Take by mouth.    . citalopram (CELEXA) 20 MG tablet Take 1 tablet (20 mg total) by mouth daily. 30 tablet 3  . gabapentin (NEURONTIN) 300 MG capsule Take 1 capsule (300 mg total) by mouth 3 (three) times daily. 30 capsule 3  . hydroxychloroquine (  PLAQUENIL) 200 MG tablet Take 1 tablet (200 mg total) by mouth 2 (two) times daily. (Patient taking differently: Take 200 mg by mouth daily. ) 180 tablet 3  . lidocaine (LIDODERM) 5 % Place 1 patch onto the skin daily. Remove & Discard patch within 12 hours or as directed by MD 30 patch 6  . metoprolol succinate (TOPROL-XL) 50 MG 24 hr tablet Take 50 mg by mouth daily. Take  with or immediately following a meal.    . mycophenolate (MYFORTIC) 180 MG EC tablet Take 180 mg by mouth 2 (two) times daily.    . Naphazoline HCl (CLEAR EYES OP) Place 1 drop into both eyes daily as needed (dry eyes).    . predniSONE (DELTASONE) 5 MG tablet Take 5 mg by mouth daily.    . rosuvastatin (CRESTOR) 5 MG tablet Take 5 mg by mouth daily at 6 PM.    . sulfamethoxazole-trimethoprim (BACTRIM) 400-80 MG tablet Take 1 tablet by mouth every Monday, Wednesday, and Friday. Take 1 tablet by mouth every Monday, Wednesday, and Friday.    . tacrolimus (PROGRAF) 0.5 MG capsule Take 0.5 mg by mouth 2 (two) times daily. 1.$RemoveBefore'5mg'aUemkucBIKEUr$  in the am & $Re'1mg'oFk$  in pm    . torsemide (DEMADEX) 20 MG tablet Take 40 mg by mouth daily.    . nitroGLYCERIN (NITROSTAT) 0.4 MG SL tablet Place 0.4 mg under the tongue every 5 (five) minutes as needed for chest pain. (Patient not taking: Reported on 11/27/2019)     No current facility-administered medications on file prior to visit.     Past Medical History:  Diagnosis Date  . Anxiety    panic attack- talks herself and takes deep breathes  . Depression   . Dyspnea    with exertion   . ESRD (end stage renal disease) ([place])    hemo TTHSAT  . FSGS (focal segmental glomerulosclerosis) 2011   By renal biopsy  . Head injury    age 58  . History of kidney stones    kidney stone  . Hx of lupus nephritis 2011   by renal biopsy  . Lupus (systemic lupus erythematosus) ([name])    followed by Dr. Amil Amen  . Obesity   . Pericarditis    age 40ish  . Renal stone   . Secondary hyperparathyroidism of renal origin ([place])   . TIA (transient ischemic attack)    no residual effects   Allergies  Allergen Reactions  . Enalapril Maleate Anaphylaxis and Other (See Comments)    Throat swells  . Penicillins Other (See Comments)    Made patient lightheaded PATIENT HAS HAD A PCN REACTION WITH IMMEDIATE RASH, FACIAL/TONGUE/THROAT SWELLING, SOB, OR LIGHTHEADEDNESS WITH HYPOTENSION:  #  #   YES  #  #  Has patient had a PCN reaction causing severe rash involving mucus membranes or skin necrosis: No Has patient had a PCN reaction that required hospitalization: No Has patient had a PCN reaction occurring within the last 10 years: No If all of the above answers are "NO", then may proceed with Cephalosporin use.   . Chocolate Nausea And Vomiting  . Tape Itching, Rash and Other (See Comments)    Social History   Socioeconomic History  . Marital status: Legally Separated    Spouse name: Not on file  . Number of children: Not on file  . Years of education: Not on file  . Highest education level: Not on file  Occupational History  . Not on file  Tobacco Use  .  Smoking status: Never Smoker  . Smokeless tobacco: Never Used  Vaping Use  . Vaping Use: Never used  Substance and Sexual Activity  . Alcohol use: No  . Drug use: No  . Sexual activity: Not Currently  Other Topics Concern  . Not on file  Social History Narrative  . Not on file   Social Determinants of Health   Financial Resource Strain:   . Difficulty of Paying Living Expenses: Not on file  Food Insecurity:   . Worried About Charity fundraiser in the Last Year: Not on file  . Ran Out of Food in the Last Year: Not on file  Transportation Needs:   . Lack of Transportation (Medical): Not on file  . Lack of Transportation (Non-Medical): Not on file  Physical Activity:   . Days of Exercise per Week: Not on file  . Minutes of Exercise per Session: Not on file  Stress:   . Feeling of Stress : Not on file  Social Connections:   . Frequency of Communication with Friends and Family: Not on file  . Frequency of Social Gatherings with Friends and Family: Not on file  . Attends Religious Services: Not on file  . Active Member of Clubs or Organizations: Not on file  . Attends Archivist Meetings: Not on file  . Marital Status: Not on file   Vitals:   11/27/19 1348  BP: 100/62  Pulse: 70  Resp: 16   Temp: 98.3 F (36.8 C)  SpO2: 98%   Body mass index is 34.36 kg/m.  Physical Exam Vitals and nursing note reviewed.  Constitutional:      General: She is not in acute distress.    Appearance: She is well-developed.  HENT:     Head: Normocephalic and atraumatic.     Mouth/Throat:     Mouth: Mucous membranes are moist.     Pharynx: Oropharynx is clear.  Eyes:     Conjunctiva/sclera: Conjunctivae normal.     Pupils: Pupils are equal, round, and reactive to light.  Cardiovascular:     Rate and Rhythm: Normal rate and regular rhythm.     Pulses:          Dorsalis pedis pulses are 2+ on the right side and 2+ on the left side.     Heart sounds: No murmur heard.   Pulmonary:     Effort: Pulmonary effort is normal. No respiratory distress.     Breath sounds: Normal breath sounds.  Abdominal:     Palpations: Abdomen is soft. There is no hepatomegaly or mass.     Tenderness: There is abdominal tenderness (Mild.) in the right lower quadrant, suprapubic area and left lower quadrant. There is no guarding or rebound.  Musculoskeletal:     Thoracic back: Tenderness present. No spasms.     Lumbar back: Tenderness and bony tenderness present. No edema or spasms.       Back:     Comments: Sacral and coccygeal area very tender with palpation.  Lymphadenopathy:     Cervical: No cervical adenopathy.  Skin:    General: Skin is warm.     Findings: Ecchymosis (Scattered on LUE and LE's.) present. No erythema or rash.  Neurological:     General: No focal deficit present.     Mental Status: She is alert and oriented to person, place, and time.     Cranial Nerves: No cranial nerve deficit.     Comments: Gait mildly unstable, not assisted.  Psychiatric:        Mood and Affect: Mood is anxious. Mood is not depressed.     Comments: Well groomed, good eye contact.   ASSESSMENT AND PLAN:  Jenna [name] was seen today for follow-up.  Orders Placed This Encounter  Procedures  . DG  Sacrum/Coccyx  . Flu Vaccine QUAD 36+ mos IM    Fall in home, initial encounter Some of her meds and chronic medical problems can increase risk. Fall precautions discussed. I do not think PT is needed at this time.  Coccyalgia Doughnut pillow recommended. Explained that it may take a few weeks for pain to resolve. Further recommendations according to x-ray results.  Peripheral polyneuropathy Improved with gabapentin 300 mg at bedtime. Pending appointment with neurologist, she is calling to find out about refill status. Adequate foot care.  Hypertension with renal disease Today BP on the lower normal side and at home having episodes of hypotension. Episodes of dizziness can be caused by orthostatic hypotension. She agrees with decreasing dose of Valsartan from 40 mg to 20 mg. No changes in Metoprolol Succinate 50 mg daily. Continue monitoring BP regularly and low sat diet.  -     valsartan (DIOVAN) 40 MG tablet; Take 0.5 tablets (20 mg total) by mouth daily.  Anxiety disorder, unspecified type Probably has improved. Continue Celexa 20 mg daily. Following with psychologist, next appointment tomorrow.  Ecchymosis Most likely caused by minor trauma. We discussed some side effects of Eliquis. History and examination do not suggest a serious process at this time.  Dizziness We discussed possible etiologies. ?  Dehydration, adequate fluid intake recommended. Instructed about warning signs.  Need for influenza vaccination -     Flu Vaccine QUAD 36+ mos IM  Spent 40 minutes with pt.  During this time history was obtained and documented, examination was performed, prior labs reviewed, and assessment/plan discussed.   Return in about 4 weeks (around 12/25/2019) for HTN.Marland Kitchen   Jenna Cottone G. [name], MD  [facility]. [place] office.  A few things to remember from today's visit:   Hypertension with renal disease  Peripheral polyneuropathy  Anxiety disorder,  unspecified type  Ecchymosis  Fall in home, initial encounter - Plan: DG Sacrum/Coccyx  Coccyalgia - Plan: DG Sacrum/Coccyx  Decrease Valsartan 40 mg from 1 tab to 1/2 tab. Continue monitoring blood pressures. No changes in rest of your meds. Please call neurologist's office.  If you need refills please call your pharmacy. Do not use My Chart to request refills or for acute issues that need immediate attention.    Please be sure medication list is accurate. If a new problem present, please set up appointment sooner than planned today.

## 2019-11-27 NOTE — Patient Instructions (Addendum)
A few things to remember from today's visit:   Hypertension with renal disease  Peripheral polyneuropathy  Anxiety disorder, unspecified type  Ecchymosis  Fall in home, initial encounter - Plan: DG Sacrum/Coccyx  Coccyalgia - Plan: DG Sacrum/Coccyx  Decrease Valsartan 40 mg from 1 tab to 1/2 tab. Continue monitoring blood pressures. No changes in rest of your meds. Please call neurologist's office.  If you need refills please call your pharmacy. Do not use My Chart to request refills or for acute issues that need immediate attention.    Please be sure medication list is accurate. If a new problem present, please set up appointment sooner than planned today.

## 2019-11-28 DIAGNOSIS — Z79899 Other long term (current) drug therapy: Secondary | ICD-10-CM | POA: Diagnosis not present

## 2019-11-28 DIAGNOSIS — F418 Other specified anxiety disorders: Secondary | ICD-10-CM | POA: Diagnosis not present

## 2019-12-04 ENCOUNTER — Encounter: Payer: Medicare Other | Admitting: Physical Therapy

## 2019-12-05 DIAGNOSIS — I480 Paroxysmal atrial fibrillation: Secondary | ICD-10-CM | POA: Diagnosis not present

## 2019-12-05 DIAGNOSIS — D631 Anemia in chronic kidney disease: Secondary | ICD-10-CM | POA: Diagnosis not present

## 2019-12-05 DIAGNOSIS — Z940 Kidney transplant status: Secondary | ICD-10-CM | POA: Diagnosis not present

## 2019-12-05 DIAGNOSIS — I770 Arteriovenous fistula, acquired: Secondary | ICD-10-CM | POA: Diagnosis not present

## 2019-12-05 DIAGNOSIS — N189 Chronic kidney disease, unspecified: Secondary | ICD-10-CM | POA: Diagnosis not present

## 2019-12-05 DIAGNOSIS — E785 Hyperlipidemia, unspecified: Secondary | ICD-10-CM | POA: Diagnosis not present

## 2019-12-05 DIAGNOSIS — Z79899 Other long term (current) drug therapy: Secondary | ICD-10-CM | POA: Diagnosis not present

## 2019-12-05 DIAGNOSIS — I2510 Atherosclerotic heart disease of native coronary artery without angina pectoris: Secondary | ICD-10-CM | POA: Diagnosis not present

## 2019-12-05 DIAGNOSIS — M329 Systemic lupus erythematosus, unspecified: Secondary | ICD-10-CM | POA: Diagnosis not present

## 2019-12-05 DIAGNOSIS — I129 Hypertensive chronic kidney disease with stage 1 through stage 4 chronic kidney disease, or unspecified chronic kidney disease: Secondary | ICD-10-CM | POA: Diagnosis not present

## 2019-12-07 ENCOUNTER — Encounter: Payer: Self-pay | Admitting: Neurology

## 2019-12-26 DIAGNOSIS — F418 Other specified anxiety disorders: Secondary | ICD-10-CM | POA: Diagnosis not present

## 2019-12-31 ENCOUNTER — Ambulatory Visit: Payer: Medicare Other | Admitting: Family Medicine

## 2020-01-01 ENCOUNTER — Encounter: Payer: Self-pay | Admitting: Family Medicine

## 2020-01-01 ENCOUNTER — Other Ambulatory Visit: Payer: Self-pay

## 2020-01-01 ENCOUNTER — Ambulatory Visit (INDEPENDENT_AMBULATORY_CARE_PROVIDER_SITE_OTHER): Payer: Medicare Other | Admitting: Family Medicine

## 2020-01-01 VITALS — BP 106/50 | HR 54 | Temp 98.2°F | Resp 16 | Ht 65.0 in | Wt 210.2 lb

## 2020-01-01 DIAGNOSIS — R42 Dizziness and giddiness: Secondary | ICD-10-CM | POA: Diagnosis not present

## 2020-01-01 DIAGNOSIS — I129 Hypertensive chronic kidney disease with stage 1 through stage 4 chronic kidney disease, or unspecified chronic kidney disease: Secondary | ICD-10-CM

## 2020-01-01 DIAGNOSIS — F419 Anxiety disorder, unspecified: Secondary | ICD-10-CM

## 2020-01-01 DIAGNOSIS — J309 Allergic rhinitis, unspecified: Secondary | ICD-10-CM

## 2020-01-01 MED ORDER — METOPROLOL SUCCINATE ER 50 MG PO TB24: ORAL_TABLET | ORAL | 0 refills | Status: DC

## 2020-01-01 NOTE — Patient Instructions (Addendum)
A few things to remember from today's visit:  Hypertension with renal disease  Anxiety disorder, unspecified type  Allergic rhinitis, unspecified seasonality, unspecified trigger  Dizziness  KY gel , small amount rubbed on your nose mucosa may help with dryness. Humidifier.  Blood pressure is mildly low. Decreased metoprolol Succinate from 50 mg to 25 mg. Continue Valsartan 20 mg (1/2 tab of 40 mg). Keeps appointment with cardiologist. Monitor blood pressure and pulse at home.  Move slowly because you ma fall.  You can replace a meal with a booster.   If you need refills please call your pharmacy. Do not use My Chart to request refills or for acute issues that need immediate attention.    Please be sure medication list is accurate. If a new problem present, please set up appointment sooner than planned today.

## 2020-01-01 NOTE — Assessment & Plan Note (Signed)
This could be a contributing factor for episodes of epistasis as well as nasal mucosa dryness. For now I do not recommend intranasal steroid, this could aggravate bleeding. Nasal saline irrigations as needed to continue. For dryness of nasal mucosa she can try OTC KY, small amount on septum.

## 2020-01-01 NOTE — Progress Notes (Signed)
HPI: Jenna [name] is a 64 y.o. female with history of HFrEf, ESRD, atrial fibrillation on chronic anticoagulation, CAD, and lupus here today to follow on recent OV.   She was last seen on 11/27/19. No new problems since her last visit.  Coccygeal pain is greatly improved. Chronic back pain is back to her baseline.  No other fall since last visit. She has seen nutritionist , virtual visit yesterday.  Anxiety: She was started on Lexapro 5 mg daily. She has also seen psychiatrist 2 times since her last visit. Sadness and feeling angry sometimes. Her brother moved out, she is now living alone, which she prefers.  Her daughter is planning on getting married next month, she feels excited but at the same time "sad."  This has aggravated anxiety, states that even though her daughter is planning her own wedding, she receives phone calls frequently asking details about wedding, this bothers her.  Still having episodic dizziness, slightly better.  She has had about 6 episodes since her last visit, exacerbated by rapid movements and when getting up, lasts a few seconds. No associated CP, dyspnea, palpitation, or diaphoresis. BP today mildly low. She denies checking BP at home. Currently she is on valsartan 40 mg 1/2 tablet, dose was decreased last visit. She is also on metoprolol succinate 50 mg daily. She is trying to keep up with fluids.  Lab Results  Component Value Date   CREATININE 2.35 (H) 06/22/2019   BUN 42 (H) 06/22/2019   NA 137 06/22/2019   K 4.2 06/22/2019   CL 103 06/22/2019   CO2 22 06/22/2019   She has had some frontal "sinus" headache. Post nasal drainage. Dry nasal mucosa, intermittent episodes of epistaxis. She is using OTC nasal spray. Negative for fever, sore throat, cough, wheezing, or unusual fatigue.  Review of Systems  Constitutional: Positive for fatigue. Negative for activity change and appetite change.  HENT: Negative for mouth sores and sinus  pain.   Eyes: Negative for redness and visual disturbance.  Gastrointestinal: Negative for abdominal pain, nausea and vomiting.       Negative for changes in bowel habits.  Genitourinary: Negative for decreased urine volume, dysuria and hematuria.  Allergic/Immunologic: Positive for environmental allergies.  Neurological: Negative for syncope, facial asymmetry and weakness.  Psychiatric/Behavioral: Negative for confusion. The patient is nervous/anxious.   Rest see pertinent positives and negatives per HPI.  Current Outpatient Medications on File Prior to Visit  Medication Sig Dispense Refill  . acetaminophen (TYLENOL) 500 MG tablet Take 1,000 mg by mouth every 6 (six) hours as needed for headache.    Marland Kitchen apixaban (ELIQUIS) 5 MG TABS tablet Take 5 mg by mouth 2 (two) times daily.    Marland Kitchen aspirin 81 MG EC tablet Take by mouth.    . escitalopram (LEXAPRO) 5 MG tablet Take 5 mg by mouth daily.    Marland Kitchen gabapentin (NEURONTIN) 300 MG capsule Take 1 capsule (300 mg total) by mouth 3 (three) times daily. 30 capsule 3  . hydroxychloroquine (PLAQUENIL) 200 MG tablet Take 1 tablet (200 mg total) by mouth 2 (two) times daily. (Patient taking differently: Take 200 mg by mouth daily. ) 180 tablet 3  . lidocaine (LIDODERM) 5 % Place 1 patch onto the skin daily. Remove & Discard patch within 12 hours or as directed by MD 30 patch 6  . mycophenolate (MYFORTIC) 180 MG EC tablet Take 180 mg by mouth 2 (two) times daily.    . Naphazoline HCl (CLEAR  EYES OP) Place 1 drop into both eyes daily as needed (dry eyes).    . nitroGLYCERIN (NITROSTAT) 0.4 MG SL tablet Place 0.4 mg under the tongue every 5 (five) minutes as needed for chest pain.     . predniSONE (DELTASONE) 5 MG tablet Take 5 mg by mouth daily.    . rosuvastatin (CRESTOR) 5 MG tablet Take 5 mg by mouth daily at 6 PM.    . sulfamethoxazole-trimethoprim (BACTRIM) 400-80 MG tablet Take 1 tablet by mouth every Monday, Wednesday, and Friday. Take 1 tablet by mouth  every Monday, Wednesday, and Friday.    . tacrolimus (PROGRAF) 0.5 MG capsule Take 0.5 mg by mouth 2 (two) times daily. 1.$RemoveBefore'5mg'sbGnazllTyZTy$  in the am & $Re'1mg'cbN$  in pm    . torsemide (DEMADEX) 20 MG tablet Take 40 mg by mouth daily.    . valsartan (DIOVAN) 40 MG tablet Take 0.5 tablets (20 mg total) by mouth daily. 30 tablet 1   No current facility-administered medications on file prior to visit.     Past Medical History:  Diagnosis Date  . Anxiety    panic attack- talks herself and takes deep breathes  . Depression   . Dyspnea    with exertion   . ESRD (end stage renal disease) ([place])    hemo TTHSAT  . FSGS (focal segmental glomerulosclerosis) 2011   By renal biopsy  . Head injury    age 38  . History of kidney stones    kidney stone  . Hx of lupus nephritis 2011   by renal biopsy  . Lupus (systemic lupus erythematosus) ([name])    followed by Dr. Amil Amen  . Obesity   . Pericarditis    age 39ish  . Renal stone   . Secondary hyperparathyroidism of renal origin ([place])   . TIA (transient ischemic attack)    no residual effects   Allergies  Allergen Reactions  . Enalapril Maleate Anaphylaxis and Other (See Comments)    Throat swells  . Penicillins Other (See Comments)    Made patient lightheaded PATIENT HAS HAD A PCN REACTION WITH IMMEDIATE RASH, FACIAL/TONGUE/THROAT SWELLING, SOB, OR LIGHTHEADEDNESS WITH HYPOTENSION:  #  #  YES  #  #  Has patient had a PCN reaction causing severe rash involving mucus membranes or skin necrosis: No Has patient had a PCN reaction that required hospitalization: No Has patient had a PCN reaction occurring within the last 10 years: No If all of the above answers are "NO", then may proceed with Cephalosporin use.   . Chocolate Nausea And Vomiting  . Tape Itching, Rash and Other (See Comments)    Social History   Socioeconomic History  . Marital status: Legally Separated    Spouse name: Not on file  . Number of children: Not on file  . Years of education: Not  on file  . Highest education level: Not on file  Occupational History  . Not on file  Tobacco Use  . Smoking status: Never Smoker  . Smokeless tobacco: Never Used  Vaping Use  . Vaping Use: Never used  Substance and Sexual Activity  . Alcohol use: No  . Drug use: No  . Sexual activity: Not Currently  Other Topics Concern  . Not on file  Social History Narrative  . Not on file   Social Determinants of Health   Financial Resource Strain:   . Difficulty of Paying Living Expenses: Not on file  Food Insecurity:   . Worried About Estate manager/land agent  of Food in the Last Year: Not on file  . Ran Out of Food in the Last Year: Not on file  Transportation Needs:   . Lack of Transportation (Medical): Not on file  . Lack of Transportation (Non-Medical): Not on file  Physical Activity:   . Days of Exercise per Week: Not on file  . Minutes of Exercise per Session: Not on file  Stress:   . Feeling of Stress : Not on file  Social Connections:   . Frequency of Communication with Friends and Family: Not on file  . Frequency of Social Gatherings with Friends and Family: Not on file  . Attends Religious Services: Not on file  . Active Member of Clubs or Organizations: Not on file  . Attends Archivist Meetings: Not on file  . Marital Status: Not on file   Vitals:   01/01/20 1239  BP: (!) 106/50  Pulse: (!) 54  Resp: 16  Temp: 98.2 F (36.8 C)  SpO2: 99%   Body mass index is 34.98 kg/m.  Physical Exam Vitals and nursing note reviewed.  Constitutional:      General: She is not in acute distress.    Appearance: She is well-developed.  HENT:     Head: Normocephalic and atraumatic.     Mouth/Throat:     Mouth: Mucous membranes are dry.     Pharynx: Oropharynx is clear.  Eyes:     Conjunctiva/sclera: Conjunctivae normal.     Pupils: Pupils are equal, round, and reactive to light.  Cardiovascular:     Rate and Rhythm: Regular rhythm. Bradycardia present.     Heart sounds: No  murmur heard.      Comments: DP pulse present, bilateral. Pulmonary:     Effort: Pulmonary effort is normal. No respiratory distress.     Breath sounds: Normal breath sounds.  Abdominal:     Palpations: Abdomen is soft. There is no hepatomegaly or mass.     Tenderness: There is no abdominal tenderness.  Lymphadenopathy:     Cervical: No cervical adenopathy.  Skin:    General: Skin is warm.     Findings: No erythema or rash.  Neurological:     Mental Status: She is alert and oriented to person, place, and time.     Cranial Nerves: No cranial nerve deficit.     Comments: Stable gait, mildly antalgic, not assisted.  Psychiatric:        Mood and Affect: Mood is anxious. Mood is not depressed.   ASSESSMENT AND PLAN:  Ms.Jadamarie was seen today for follow-up.  Diagnoses and all orders for this visit:  Dizziness We discussed possible etiologies, which include some of her chronic medical problems as well as medications. Problem seems to be stable. Adequate hydration. Today BP is mildly low, antihypertensive medications were adjusted. Fall prevention discussed. Instructed about warning signs.  Hypertension with renal disease BP rechecked: 100/58. Mild hypotension can be a contributing factor for dizziness, so recommend decreasing metoprolol succinate from 50 mg daily to one half daily. Continue valsartan 20 mg daily. Low-salt diet. Recommended monitoring BP regularly. Instructed about warning signs. She has appointment with her cardiologist in 2 weeks.  Anxiety disorder Problem is not well controlled. Continue your Lexapro 5 mg daily. He is following with psychiatrist.   Allergic rhinitis This could be a contributing factor for episodes of epistasis as well as nasal mucosa dryness. For now I do not recommend intranasal steroid, this could aggravate bleeding. Nasal saline irrigations as  needed to continue. For dryness of nasal mucosa she can try OTC KY, small amount on  septum.   Spent 42 minutes with pt.  During this time history was obtained and documented, examination was performed, prior labs reviewed,and assessment/plan discussed.  Return in about 4 months (around 04/30/2020) for HTN.  Brazen Domangue G. [name], MD  [facility]. [place] office.   A few things to remember from today's visit:  Hypertension with renal disease  Anxiety disorder, unspecified type  Allergic rhinitis, unspecified seasonality, unspecified trigger  Dizziness  KY gel , small amount rubbed on your nose mucosa may help with dryness. Humidifier.  Blood pressure is mildly low. Decreased metoprolol Succinate from 50 mg to 25 mg. Continue Valsartan 20 mg (1/2 tab of 40 mg). Keeps appointment with cardiologist. Monitor blood pressure and pulse at home.  Move slowly because you ma fall.  You can replace a meal with a booster.   If you need refills please call your pharmacy. Do not use My Chart to request refills or for acute issues that need immediate attention.    Please be sure medication list is accurate. If a new problem present, please set up appointment sooner than planned today.

## 2020-01-01 NOTE — Assessment & Plan Note (Signed)
Problem is not well controlled. Continue your Lexapro 5 mg daily. He is following with psychiatrist.

## 2020-01-01 NOTE — Assessment & Plan Note (Signed)
BP rechecked: 100/58. Mild hypotension can be a contributing factor for dizziness, so recommend decreasing metoprolol succinate from 50 mg daily to one half daily. Continue valsartan 20 mg daily. Low-salt diet. Recommended monitoring BP regularly. Instructed about warning signs. She has appointment with her cardiologist in 2 weeks.

## 2020-01-11 ENCOUNTER — Other Ambulatory Visit: Payer: Self-pay | Admitting: Family Medicine

## 2020-01-11 ENCOUNTER — Ambulatory Visit
Admission: EM | Admit: 2020-01-11 | Discharge: 2020-01-11 | Disposition: A | Discharge status: Home / Self Care | Payer: Medicare Other | Attending: Emergency Medicine | Admitting: Emergency Medicine

## 2020-01-11 ENCOUNTER — Other Ambulatory Visit: Payer: Self-pay

## 2020-01-11 DIAGNOSIS — H9319 Tinnitus, unspecified ear: Secondary | ICD-10-CM

## 2020-01-11 DIAGNOSIS — R42 Dizziness and giddiness: Secondary | ICD-10-CM | POA: Diagnosis not present

## 2020-01-11 DIAGNOSIS — I5020 Unspecified systolic (congestive) heart failure: Secondary | ICD-10-CM | POA: Diagnosis not present

## 2020-01-11 DIAGNOSIS — I252 Old myocardial infarction: Secondary | ICD-10-CM | POA: Diagnosis not present

## 2020-01-11 DIAGNOSIS — I2510 Atherosclerotic heart disease of native coronary artery without angina pectoris: Secondary | ICD-10-CM | POA: Diagnosis not present

## 2020-01-11 DIAGNOSIS — I454 Nonspecific intraventricular block: Secondary | ICD-10-CM | POA: Diagnosis not present

## 2020-01-11 DIAGNOSIS — R202 Paresthesia of skin: Secondary | ICD-10-CM

## 2020-01-11 DIAGNOSIS — I493 Ventricular premature depolarization: Secondary | ICD-10-CM | POA: Diagnosis not present

## 2020-01-11 DIAGNOSIS — Z9861 Coronary angioplasty status: Secondary | ICD-10-CM | POA: Diagnosis not present

## 2020-01-11 DIAGNOSIS — I255 Ischemic cardiomyopathy: Secondary | ICD-10-CM | POA: Diagnosis not present

## 2020-01-11 DIAGNOSIS — R531 Weakness: Secondary | ICD-10-CM | POA: Diagnosis not present

## 2020-01-11 DIAGNOSIS — I959 Hypotension, unspecified: Secondary | ICD-10-CM

## 2020-01-11 DIAGNOSIS — R200 Anesthesia of skin: Secondary | ICD-10-CM

## 2020-01-11 DIAGNOSIS — I5022 Chronic systolic (congestive) heart failure: Secondary | ICD-10-CM | POA: Diagnosis not present

## 2020-01-11 DIAGNOSIS — M329 Systemic lupus erythematosus, unspecified: Secondary | ICD-10-CM | POA: Diagnosis not present

## 2020-01-11 DIAGNOSIS — I480 Paroxysmal atrial fibrillation: Secondary | ICD-10-CM | POA: Diagnosis not present

## 2020-01-11 DIAGNOSIS — Z20822 Contact with and (suspected) exposure to covid-19: Secondary | ICD-10-CM | POA: Diagnosis not present

## 2020-01-11 DIAGNOSIS — R55 Syncope and collapse: Secondary | ICD-10-CM | POA: Diagnosis not present

## 2020-01-11 DIAGNOSIS — Z940 Kidney transplant status: Secondary | ICD-10-CM | POA: Diagnosis not present

## 2020-01-11 DIAGNOSIS — I498 Other specified cardiac arrhythmias: Secondary | ICD-10-CM | POA: Diagnosis not present

## 2020-01-11 DIAGNOSIS — R410 Disorientation, unspecified: Secondary | ICD-10-CM | POA: Diagnosis not present

## 2020-01-11 DIAGNOSIS — W19XXXA Unspecified fall, initial encounter: Secondary | ICD-10-CM

## 2020-01-11 NOTE — ED Triage Notes (Signed)
Per pt daughter, pt fall twice yesterday and hit her head. She has a knot on the front of her head. Pt also is having issue with low blood pressure and dizziness. Pt state she does not know how she got on the floor.

## 2020-01-11 NOTE — ED Provider Notes (Signed)
[facility]    CSN: [id number] Arrival date & time: 01/11/20  1008      History   Chief Complaint Chief Complaint  Patient presents with  . Hypotension  . Fall  . Dizziness    HPI Jenna [name] is a 64 y.o. female  Presenting with her daughter/caregiver for multiple concerns.  Daughter provides history: Endorsing fall x2 yesterday including head trauma and LOC.  States patient has been having disorientation and weakness for the last few days without fever, known illness.  Denies recent change in medications, reports compliance with anticoagulant.  Patient also notes ear ringing that began yesterday, though is unsure which side its affecting.  Denies slurred speech, unilateral weakness or difficulty moving extremities.  States that she had a "widow maker heart attack" last year and "did not have any symptoms".  Denies CP, SOB, N, V.    Past Medical History:  Diagnosis Date  . Anxiety    panic attack- talks herself and takes deep breathes  . Depression   . Dyspnea    with exertion   . ESRD (end stage renal disease) ([place])    hemo TTHSAT  . FSGS (focal segmental glomerulosclerosis) 2011   By renal biopsy  . Head injury    age 30  . History of kidney stones    kidney stone  . Hx of lupus nephritis 2011   by renal biopsy  . Lupus (systemic lupus erythematosus) ([name])    followed by Dr. Amil Amen  . Obesity   . Pericarditis    age 33ish  . Renal stone   . Secondary hyperparathyroidism of renal origin ([place])   . TIA (transient ischemic attack)    no residual effects    Patient Active Problem List   Diagnosis Date Noted  . Allergic rhinitis 01/01/2020  . Anxiety disorder 06/26/2019  . HFrEF (heart failure with reduced ejection fraction) ([place]) 03/06/2019  . CAD (coronary artery disease) 03/06/2019  . Paroxysmal atrial fibrillation ([place]) 03/06/2019  . Dyslipidemia 11/06/2017  . ESRD on dialysis ([place]) 06/27/2017  . CKD (chronic kidney disease) stage 5, GFR  less than 15 ml/min ([name]) 02/15/2017  . Back pain at L4-L5 level 10/31/2014  . Muscle spasm of back 10/31/2014  . Visit for screening mammogram 09/30/2014  . Chronic maxillary sinusitis 07/04/2014  . Occipital headache 07/04/2014  . Increased urinary frequency 05/23/2014  . Falls 05/23/2014  . Prediabetes 05/23/2014  . Rash and nonspecific skin eruption 05/23/2014  . 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 Encounter for immunization 11/19/2013  . Lupus (systemic lupus erythematosus) ([place]) 09/24/2013  . CKD (chronic kidney disease) stage 4, GFR 15-29 ml/min ([name]) 07/02/2013  . TIA (transient ischemic attack) 05/05/2013  . Numbness and tingling of left side of face 05/04/2013  . Lupus ([place]) 04/15/2011  . 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  . 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    Past Surgical History:  Procedure Laterality Date  . AV FISTULA PLACEMENT Left 07/04/2017   Procedure: ARTERIOVENOUS (AV) FISTULA CREATION BRACHIOCEPHALIC;  Surgeon: [name], MD;  Location: [facility] OR;  Service: Vascular;  Laterality: Left;  . COLONOSCOPY W/ POLYPECTOMY    . IR FLUORO GUIDE CV LINE RIGHT  06/28/2017  . IR US GUIDE VASC ACCESS RIGHT  06/28/2017  . KIDNEY SURGERY     kidney transplant 2020  . REVISION OF ARTERIOVENOUS GORETEX GRAFT Left 11/30/2017   Procedure: REVISION OF ARTERIOVENOUS FISTULA LEFT ARM Superfistulization and branch ligation.;  Surgeon: Donzetta Matters,  Georgia Dom, MD;  Location: [facility];  Service: Vascular;  Laterality: Left;    OB History    Gravida  2   Para  1   Term      Preterm      AB  1   Living  1     SAB  1   TAB      Ectopic      Multiple      Live Births               Home Medications    Prior to Admission medications   Medication Sig Start Date End Date Taking? Authorizing Provider  acetaminophen (TYLENOL) 500 MG tablet Take 1,000 mg by mouth every 6 (six) hours as needed for headache.    [provider]   apixaban (ELIQUIS) 5 MG TABS tablet Take 5 mg by mouth 2 (two) times daily.    [provider]  aspirin 81 MG EC tablet Take by mouth. 11/21/19   [provider]  escitalopram (LEXAPRO) 5 MG tablet Take 5 mg by mouth daily. 11/28/19   [provider]  gabapentin (NEURONTIN) 300 MG capsule Take 1 capsule (300 mg total) by mouth 3 (three) times daily. 09/26/19   [name], Betty G, MD  hydroxychloroquine (PLAQUENIL) 200 MG tablet Take 1 tablet (200 mg total) by mouth 2 (two) times daily. Patient taking differently: Take 200 mg by mouth daily.  12/25/13   Tresa Garter, MD  lidocaine (LIDODERM) 5 % Place 1 patch onto the skin daily. Remove & Discard patch within 12 hours or as directed by MD 01/05/19   [name], Betty G, MD  metoprolol succinate (TOPROL-XL) 50 MG 24 hr tablet Take 1/2 tab with or immediately following a meal. 01/01/20   [name], Betty G, MD  mycophenolate (MYFORTIC) 180 MG EC tablet Take 180 mg by mouth 2 (two) times daily.    [provider]  Naphazoline HCl (CLEAR EYES OP) Place 1 drop into both eyes daily as needed (dry eyes).    [provider]  nitroGLYCERIN (NITROSTAT) 0.4 MG SL tablet Place 0.4 mg under the tongue every 5 (five) minutes as needed for chest pain.     [provider]  predniSONE (DELTASONE) 5 MG tablet Take 5 mg by mouth daily. 05/16/19   [provider]  rosuvastatin (CRESTOR) 5 MG tablet Take 5 mg by mouth daily at 6 PM. 05/02/19   [provider]  sulfamethoxazole-trimethoprim (BACTRIM) 400-80 MG tablet Take 1 tablet by mouth every Monday, Wednesday, and Friday. Take 1 tablet by mouth every Monday, Wednesday, and Friday. 03/21/19   [provider]  tacrolimus (PROGRAF) 0.5 MG capsule Take 0.5 mg by mouth 2 (two) times daily. 1.$RemoveBefore'5mg'XmfJFyiguofPv$  in the am & $Re'1mg'znD$  in pm    [provider]  torsemide (DEMADEX) 20 MG tablet Take 40 mg by mouth daily. 06/20/19   [provider]  valsartan  (DIOVAN) 40 MG tablet Take 0.5 tablets (20 mg total) by mouth daily. 11/27/19   [name], Betty G, MD    Family History Family History  Problem Relation Age of Onset  . Diabetes Mother   . Hypertension Father   . Cancer Sister   . Hypertension Brother     Social History Social History   Tobacco Use  . Smoking status: Never Smoker  . Smokeless tobacco: Never Used  Vaping Use  . Vaping Use: Never used  Substance Use Topics  . Alcohol use: No  .  Drug use: No     Allergies   Enalapril maleate, Penicillins, Chocolate, and Tape   Review of Systems Review of Systems  Constitutional: Negative for fatigue and fever.  HENT: Positive for tinnitus. Negative for ear pain, sinus pain, sore throat and voice change.   Eyes: Negative for pain, redness and visual disturbance.  Respiratory: Negative for cough and shortness of breath.   Cardiovascular: Negative for chest pain and palpitations.  Gastrointestinal: Negative for abdominal pain, diarrhea and vomiting.  Musculoskeletal: Negative for arthralgias and myalgias.  Skin: Negative for rash and wound.  Neurological: Positive for dizziness, weakness and light-headedness. Negative for syncope and headaches.  Psychiatric/Behavioral: Positive for confusion.     Physical Exam Triage Vital Signs ED Triage Vitals  Enc Vitals Group     BP 01/11/20 1020 94/61     Pulse Rate 01/11/20 1020 60     Resp 01/11/20 1020 16     Temp 01/11/20 1020 98.3 F (36.8 C)     Temp Source 01/11/20 1020 Oral     SpO2 01/11/20 1020 97 %     Weight --      Height --      Head Circumference --      Peak Flow --      Pain Score 01/11/20 1022 7     Pain Loc --      Pain Edu? --      Excl. in Effort? --    Orthostatic VS for the past 24 hrs:  BP- Lying Pulse- Lying BP- Sitting Pulse- Sitting BP- Standing at 0 minutes Pulse- Standing at 0 minutes  01/11/20 1038 99/59 60 96/54 60 92/60 60    Updated Vital Signs BP 94/61 (BP Location: Right Arm)   Pulse  60   Temp 98.3 F (36.8 C) (Oral)   Resp 16   SpO2 97%   Visual Acuity Right Eye Distance:   Left Eye Distance:   Bilateral Distance:    Right Eye Near:   Left Eye Near:    Bilateral Near:     Physical Exam Constitutional:      General: She is not in acute distress.    Appearance: She is obese. She is not toxic-appearing or diaphoretic.  HENT:     Head: Normocephalic and atraumatic.     Right Ear: Tympanic membrane and ear canal normal.     Left Ear: Tympanic membrane and ear canal normal.     Ears:     Comments: Negative hemotympanum bilaterally    Mouth/Throat:     Mouth: Mucous membranes are moist.     Pharynx: No oropharyngeal exudate or posterior oropharyngeal erythema.  Eyes:     General: No scleral icterus.    Pupils: Pupils are equal, round, and reactive to light.  Cardiovascular:     Rate and Rhythm: Normal rate and regular rhythm.  Pulmonary:     Effort: Pulmonary effort is normal. No respiratory distress.     Breath sounds: No wheezing.  Musculoskeletal:        General: Tenderness present. Normal range of motion.  Skin:    Capillary Refill: Capillary refill takes less than 2 seconds.     Coloration: Skin is not jaundiced or pale.  Neurological:     General: No focal deficit present.     Mental Status: She is alert and oriented to person, place, and time.      UC Treatments / Results  Labs (all labs ordered are listed, but only  abnormal results are displayed) Labs Reviewed - No data to display  EKG   Radiology No results found.  Procedures Procedures (including critical care time)  Medications Ordered in UC Medications - No data to display  Initial Impression / Assessment and Plan / UC Course  I have reviewed the triage vital signs and the nursing notes.  Pertinent labs & imaging results that were available during my care of the patient were reviewed by me and considered in my medical decision making (see chart for details).      Patient without signs of orthostasis or neurocognitive deficit in office today.  Appears well-hydrated, though does have significant concerns, particularly given her medical history including kidney transplant recipient, MI, TIA.  Recommended ER for further evaluation of sudden weakness, disorientation in setting of fall x2 with head trauma, LOC.  Patient's daughter electing to transport patient to [facility] in personal vehicle in stable condition.  Return precautions discussed, pt and daughter verbalized understanding and is agreeable to plan. Final Clinical Impressions(s) / UC Diagnoses   Final diagnoses:  Fall, initial encounter  Dizziness  Tinnitus, unspecified laterality  Hypotension, unspecified hypotension type  Weakness  Disoriented   Discharge Instructions   None    ED Prescriptions    None     PDMP not reviewed this encounter.   [place], [place], [place] 01/11/20 1114

## 2020-01-12 DIAGNOSIS — I255 Ischemic cardiomyopathy: Secondary | ICD-10-CM | POA: Diagnosis not present

## 2020-01-12 DIAGNOSIS — R55 Syncope and collapse: Secondary | ICD-10-CM | POA: Diagnosis not present

## 2020-01-12 DIAGNOSIS — I2510 Atherosclerotic heart disease of native coronary artery without angina pectoris: Secondary | ICD-10-CM | POA: Diagnosis not present

## 2020-01-12 DIAGNOSIS — I480 Paroxysmal atrial fibrillation: Secondary | ICD-10-CM | POA: Diagnosis not present

## 2020-01-12 DIAGNOSIS — I5022 Chronic systolic (congestive) heart failure: Secondary | ICD-10-CM | POA: Diagnosis present

## 2020-01-12 DIAGNOSIS — I5020 Unspecified systolic (congestive) heart failure: Secondary | ICD-10-CM | POA: Diagnosis not present

## 2020-01-12 DIAGNOSIS — Z833 Family history of diabetes mellitus: Secondary | ICD-10-CM | POA: Diagnosis not present

## 2020-01-12 DIAGNOSIS — I493 Ventricular premature depolarization: Secondary | ICD-10-CM | POA: Diagnosis present

## 2020-01-12 DIAGNOSIS — F419 Anxiety disorder, unspecified: Secondary | ICD-10-CM | POA: Diagnosis present

## 2020-01-12 DIAGNOSIS — Z955 Presence of coronary angioplasty implant and graft: Secondary | ICD-10-CM | POA: Diagnosis not present

## 2020-01-12 DIAGNOSIS — Z940 Kidney transplant status: Secondary | ICD-10-CM | POA: Diagnosis not present

## 2020-01-12 DIAGNOSIS — I252 Old myocardial infarction: Secondary | ICD-10-CM | POA: Diagnosis not present

## 2020-01-12 DIAGNOSIS — Z823 Family history of stroke: Secondary | ICD-10-CM | POA: Diagnosis not present

## 2020-01-12 DIAGNOSIS — N189 Chronic kidney disease, unspecified: Secondary | ICD-10-CM | POA: Diagnosis present

## 2020-01-12 DIAGNOSIS — Z20822 Contact with and (suspected) exposure to covid-19: Secondary | ICD-10-CM | POA: Diagnosis present

## 2020-01-12 DIAGNOSIS — I69314 Frontal lobe and executive function deficit following cerebral infarction: Secondary | ICD-10-CM | POA: Diagnosis not present

## 2020-01-12 DIAGNOSIS — M329 Systemic lupus erythematosus, unspecified: Secondary | ICD-10-CM | POA: Diagnosis present

## 2020-01-12 DIAGNOSIS — I959 Hypotension, unspecified: Secondary | ICD-10-CM | POA: Diagnosis not present

## 2020-01-12 DIAGNOSIS — Z9861 Coronary angioplasty status: Secondary | ICD-10-CM | POA: Diagnosis not present

## 2020-01-12 DIAGNOSIS — Z86711 Personal history of pulmonary embolism: Secondary | ICD-10-CM | POA: Diagnosis not present

## 2020-01-12 DIAGNOSIS — E860 Dehydration: Secondary | ICD-10-CM | POA: Diagnosis present

## 2020-01-12 DIAGNOSIS — Z8673 Personal history of transient ischemic attack (TIA), and cerebral infarction without residual deficits: Secondary | ICD-10-CM | POA: Diagnosis not present

## 2020-01-14 MED ORDER — GABAPENTIN 300 MG PO CAPS: 300.0000 mg | ORAL_CAPSULE | Freq: Every day | ORAL | 3 refills | Status: DC

## 2020-01-14 NOTE — Telephone Encounter (Signed)
Patient is wanting to speak to Dr. Doug Sou nurse about her gabapentin (NEURONTIN) 300 MG capsule that the hospital put her on and she needed to discuss more about her medication but will discuss it when the nurse calls her back.  She said that it is very urgent that someone calls her today please.  Please advise

## 2020-01-14 NOTE — Addendum Note (Signed)
Addended by: Rodrigo Ran on: 01/14/2020 03:59 PM   Modules accepted: Orders

## 2020-01-14 NOTE — Telephone Encounter (Signed)
I spoke with pt. We corrected her gabapentin prescription and sent it into the pharmacy. Scheduled for a hospital follow up tomorrow at 11am with pcp.

## 2020-01-15 ENCOUNTER — Encounter: Payer: Self-pay | Admitting: Family Medicine

## 2020-01-15 ENCOUNTER — Other Ambulatory Visit: Payer: Self-pay

## 2020-01-15 ENCOUNTER — Ambulatory Visit (INDEPENDENT_AMBULATORY_CARE_PROVIDER_SITE_OTHER): Payer: Medicare Other | Admitting: Family Medicine

## 2020-01-15 VITALS — BP 110/50 | HR 51 | Temp 97.8°F | Resp 16 | Ht 65.0 in | Wt 205.8 lb

## 2020-01-15 DIAGNOSIS — I5020 Unspecified systolic (congestive) heart failure: Secondary | ICD-10-CM

## 2020-01-15 DIAGNOSIS — M25512 Pain in left shoulder: Secondary | ICD-10-CM

## 2020-01-15 DIAGNOSIS — I129 Hypertensive chronic kidney disease with stage 1 through stage 4 chronic kidney disease, or unspecified chronic kidney disease: Secondary | ICD-10-CM | POA: Diagnosis not present

## 2020-01-15 DIAGNOSIS — I639 Cerebral infarction, unspecified: Secondary | ICD-10-CM | POA: Diagnosis not present

## 2020-01-15 DIAGNOSIS — R001 Bradycardia, unspecified: Secondary | ICD-10-CM

## 2020-01-15 DIAGNOSIS — I480 Paroxysmal atrial fibrillation: Secondary | ICD-10-CM

## 2020-01-15 DIAGNOSIS — R7989 Other specified abnormal findings of blood chemistry: Secondary | ICD-10-CM

## 2020-01-15 DIAGNOSIS — R778 Other specified abnormalities of plasma proteins: Secondary | ICD-10-CM

## 2020-01-15 NOTE — Progress Notes (Signed)
HPI: Jenna [name] is a 64 y.o. female with hx of HTN,TIA,CAD,HFrEF,a fib,and left atrial thrombus here today with her daughter to follow on recent hospitalization.  He was hospitalized from 01/11/20 to 01/13/20. Presented to the ER because 2 episodes of fall at home with LOC and head trauma.  Negative for headache or visual changes during ER evaluation.  Syncopal events thought to be caused by hypotension.  HTN and atrial fib: Antihypertensive medications where initially held at the time of admission and gradually resumed but at lower doses.  Discharged home on Metoprolol tartrate 25 mg bid and Valsartan 20 mg daily, which she seemed to be tolerating well before hospital discharge. Head imaging during hospitalization did not reveal acute process but remote CVA.  She is using a cane and walker at home. According to pt, HH has not been arranged.  Still having lightheadedness but "not as bad."Exacerbated by standing up and bending down, alleviated by rest. + Fatigue. She has not noted CP,palpitations,SOB,or focal weakness.  She is not checking BP regularly. Eliquis,Furosemide,and Spironolactone were discontinued. She is wearing heart monitor at this time. She has several questions today and would like to go through labs and imaging results. HFrEF: She has not noted orthopnea or PND. She is on Torsemide 20 mg 2 times daily.  Echo on 01/12/20:The left ventricle is moderately dilated with normal wall thickness.  Left ventricular systolic function is moderately reduced.  LV ejection fraction = 35-40%.  Severe hypokinesis of the LAD territory with a dyskinetic/aneurysmal and thinned LV apex.  The right ventricle is normal in size and function.  There is mild mitral regurgitation.  Mild to moderate tricuspid regurgitation.   Elevated troponins: 45-49.   Ref Range & Units 4 d ago Comments  Sodium 135 - 146 MMOL/L 138        Potassium 3.5 - 5.3 MMOL/L 4.1  NO VISIBLE  HEMOLYSIS      Chloride 98 - 110 MMOL/L 104        CO2 23 - 30 MMOL/L 26        BUN 8 - 24 MG/DL 44High        Glucose 70 - 99 MG/DL 82        Creatinine 0.50 - 1.50 MG/DL 2.47MLYY        Calcium 8.5 - 10.5 MG/DL 9.1        Anion Gap 4 - 14 MMOL/L 8        Est. GFR African American >=60 ML/MIN/1.73 M*2  29Low     Brain MRI 01/12/20; 1. No acute intracranial abnormality. Specifically, no evidence of acute infarct.  2. 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 She feels like symptoms are getting worse. She states that she has tried to reach her psychiatrist to see if she can be seen before planned but unsuccessful. She lives alone. Her daughter checks on her a few times per day. She has been under a lot of stress, her daughter is getting married and her sister died a few weeks ago.  Left shoulder pain since fall. She landed on left side, so she has achy pain of upper and LE's. Pain is exacerbated by movement and alleviated by rest.  Review of Systems  Constitutional: Positive for activity change, appetite change and fatigue. Negative for fever.  HENT: Negative for mouth sores, nosebleeds, sore throat and trouble swallowing.   Eyes: Negative for redness and visual disturbance.  Respiratory: Negative for cough and wheezing.   Gastrointestinal: Negative for abdominal pain, nausea and vomiting.       Negative for changes in bowel habits.  Genitourinary: Negative for decreased urine  volume, dysuria and hematuria.  Musculoskeletal: Positive for gait problem.  Skin: Negative for rash and wound.  Neurological: Negative for syncope and weakness.  Psychiatric/Behavioral: Negative for confusion. The patient is nervous/anxious.   Rest see pertinent positives and negatives per HPI.  Current Outpatient Medications on File Prior to Visit  Medication Sig Dispense Refill  . acetaminophen (TYLENOL) 500 MG tablet Take 1,000 mg by mouth every 6 (six) hours as needed for headache.    Marland Kitchen aspirin 81 MG EC tablet Take by mouth.    . escitalopram (LEXAPRO) 5 MG tablet Take 5 mg by mouth daily.    Marland Kitchen gabapentin (NEURONTIN) 300 MG capsule Take 1 capsule (300 mg total) by mouth daily. 30 capsule 3  . hydroxychloroquine (PLAQUENIL) 200 MG tablet Take 1 tablet (200 mg total) by mouth 2 (two) times daily. (Patient taking differently: Take 200 mg by mouth daily. ) 180 tablet 3  . lidocaine (LIDODERM) 5 % Place 1 patch onto the skin daily. Remove & Discard patch within 12 hours or as directed by MD 30 patch 6  . mycophenolate (MYFORTIC) 180 MG EC tablet Take 180 mg by mouth 2 (two) times daily.    . Naphazoline HCl (CLEAR EYES OP) Place 1 drop into both eyes daily as needed (dry eyes).    . nitroGLYCERIN (NITROSTAT) 0.4 MG SL tablet Place 0.4 mg under the tongue every 5 (five) minutes as needed for chest pain.     . predniSONE (DELTASONE) 5 MG tablet Take 5 mg by mouth daily.    . rosuvastatin (CRESTOR) 5 MG tablet Take 5 mg by mouth daily at 6 PM.    . sulfamethoxazole-trimethoprim (BACTRIM) 400-80 MG tablet Take 1 tablet by mouth every Monday, Wednesday, and Friday. Take 1 tablet by mouth every Monday, Wednesday, and Friday.    . tacrolimus (PROGRAF) 0.5 MG capsule Take 0.5 mg by mouth 2 (two) times daily. 1.$RemoveBefore'5mg'XpBKQAsgcDhzQ$  in the am & $Re'1mg'LkE$  in pm    . torsemide (DEMADEX) 20 MG tablet Take 40 mg by mouth daily.    . valsartan (DIOVAN) 40 MG tablet Take 0.5 tablets (20 mg total) by mouth daily. 30 tablet 1   No  current facility-administered medications on file prior to visit.   Past Medical History:  Diagnosis Date  . Anxiety    panic attack- talks herself and takes deep breathes  . Depression   . Dyspnea    with exertion   . ESRD (end stage renal disease) ([place])    hemo TTHSAT  .  FSGS (focal segmental glomerulosclerosis) 2011   By renal biopsy  . Head injury    age 51  . History of kidney stones    kidney stone  . Hx of lupus nephritis 2011   by renal biopsy  . Lupus (systemic lupus erythematosus) ([name])    followed by Dr. Amil Amen  . Obesity   . Pericarditis    age 13ish  . Renal stone   . Secondary hyperparathyroidism of renal origin ([place])   . TIA (transient ischemic attack)    no residual effects   Allergies  Allergen Reactions  . Enalapril Maleate Anaphylaxis and Other (See Comments)    Throat swells  . Penicillins Other (See Comments)    Made patient lightheaded PATIENT HAS HAD A PCN REACTION WITH IMMEDIATE RASH, FACIAL/TONGUE/THROAT SWELLING, SOB, OR LIGHTHEADEDNESS WITH HYPOTENSION:  #  #  YES  #  #  Has patient had a PCN reaction causing severe rash involving mucus membranes or skin necrosis: No Has patient had a PCN reaction that required hospitalization: No Has patient had a PCN reaction occurring within the last 10 years: No If all of the above answers are "NO", then may proceed with Cephalosporin use.   . Chocolate Nausea And Vomiting  . Tape Itching, Rash and Other (See Comments)    Social History   Socioeconomic History  . Marital status: Legally Separated    Spouse name: Not on file  . Number of children: Not on file  . Years of education: Not on file  . Highest education level: Not on file  Occupational History  . Not on file  Tobacco Use  . Smoking status: Never Smoker  . Smokeless tobacco: Never Used  Vaping Use  . Vaping Use: Never used  Substance and Sexual Activity  . Alcohol use: No  . Drug use: No  . Sexual activity: Not Currently  Other  Topics Concern  . Not on file  Social History Narrative  . Not on file   Social Determinants of Health   Financial Resource Strain:   . Difficulty of Paying Living Expenses: Not on file  Food Insecurity:   . Worried About Charity fundraiser in the Last Year: Not on file  . Ran Out of Food in the Last Year: Not on file  Transportation Needs:   . Lack of Transportation (Medical): Not on file  . Lack of Transportation (Non-Medical): Not on file  Physical Activity:   . Days of Exercise per Week: Not on file  . Minutes of Exercise per Session: Not on file  Stress:   . Feeling of Stress : Not on file  Social Connections:   . Frequency of Communication with Friends and Family: Not on file  . Frequency of Social Gatherings with Friends and Family: Not on file  . Attends Religious Services: Not on file  . Active Member of Clubs or Organizations: Not on file  . Attends Archivist Meetings: Not on file  . Marital Status: Not on file    Vitals:   01/15/20 1122  BP: (!) 110/50  Pulse: (!) 51  Resp: 16  Temp: 97.8 F (36.6 C)  SpO2: 98%   Body mass index is 34.25 kg/m.  Physical Exam Vitals and nursing note reviewed.  Constitutional:      General: She is not in acute distress.    Appearance: She is well-developed.  HENT:     Head: Normocephalic and atraumatic.     Mouth/Throat:  Mouth: Mucous membranes are moist.     Pharynx: Oropharynx is clear.  Eyes:     Conjunctiva/sclera: Conjunctivae normal.  Cardiovascular:     Rate and Rhythm: Regular rhythm. Bradycardia present.     Pulses:          Dorsalis pedis pulses are 2+ on the right side and 2+ on the left side.     Heart sounds: No murmur heard.   Pulmonary:     Effort: Pulmonary effort is normal. No respiratory distress.     Breath sounds: Normal breath sounds.  Abdominal:     Palpations: Abdomen is soft. There is no hepatomegaly or mass.     Tenderness: There is no abdominal tenderness.   Musculoskeletal:     Left shoulder: No deformity or bony tenderness. Decreased range of motion (Mild).     Comments: Left shoulder pain with abduction.  Lymphadenopathy:     Cervical: No cervical adenopathy.  Skin:    General: Skin is warm.     Findings: No erythema or rash.  Neurological:     General: No focal deficit present.     Mental Status: She is alert and oriented to person, place, and time.     Cranial Nerves: No cranial nerve deficit.     Comments: She is in a wheel chair.  Psychiatric:        Mood and Affect: Mood is anxious.    ASSESSMENT AND PLAN:  Ms.Naomia was seen today for hospitalization follow-up.  Diagnoses and all orders for this visit: Orders Placed This Encounter  Procedures  . Ambulatory referral to Home Health    Left shoulder pain, unspecified chronicity Most likely soft tissue trauma. I do not think imaging is needed at this time. PT at home will be arranged through [facility]. Continue ROM exercises.  Hypertension with renal disease BP today on lower normal range. Metoprolol tartrate decreased from 25 mg bid to 1/2 tab bid. Continue Valsartan 40 mg 1/2 tab daily. Monitor BP at home. Instructed about warning signs.  Paroxysmal atrial fibrillation ([name]) Today rhythm controlled. Eliquis was discontinued during hospitalization. No thrombus was detected in left atrial appendage. Continue Metoprolol tartrate 12.5 mg bid. Cardiology appt 02/11/20.  HFrEF (heart failure with reduced ejection fraction) ([name]) Asymptomatic. Continue Metoprolol but lower dose , 12.5 mg bid, Torsemide 20 mg bid,and Valsartan 20 mg daily. Low salt diet.  Bradycardia Mild. Metoprolol dose decreased. Continue monitoring HR at home. Instructed about warning signs.  Cerebrovascular accident (CVA), unspecified mechanism ([place]) Incidental finding on head CT and brain imaging. Continue Crestor 5 mg daily and Aspirin 81 mg daily. Some side effects of antiplatelet agents  discussed.  Elevated troponin Chronic. Numbers seem to be stable. We discussed etiologies different to acute MI. CKD/ESRD,atrial fib among some to considered.   Spent 62 minutes with pt.  During this time history was obtained and documented, examination was performed, prior labs/imaging reviewed, and assessment/plan discussed.   Jenille Laszlo G. [name], MD  [facility]. [place] office.   A few things to remember from today's visit:   If you need refills please call your pharmacy. Do not use My Chart to request refills or for acute issues that need immediate attention.   Today metoprolol tartrate decreased from 25 mg to 12.5 mg 2 times daily. Monitor blood pressure and pulse.  Please be sure medication list is accurate. If a new problem present, please set up appointment sooner than planned today.

## 2020-01-15 NOTE — Patient Instructions (Addendum)
A few things to remember from today's visit:   If you need refills please call your pharmacy. Do not use My Chart to request refills or for acute issues that need immediate attention.   Today metoprolol tartrate decreased from 25 mg to 12.5 mg 2 times daily. Monitor blood pressure and pulse.  Please be sure medication list is accurate. If a new problem present, please set up appointment sooner than planned today.

## 2020-01-17 ENCOUNTER — Encounter: Payer: Self-pay | Admitting: Family Medicine

## 2020-01-31 DIAGNOSIS — R55 Syncope and collapse: Secondary | ICD-10-CM | POA: Diagnosis not present

## 2020-02-05 DIAGNOSIS — I454 Nonspecific intraventricular block: Secondary | ICD-10-CM | POA: Diagnosis not present

## 2020-02-05 DIAGNOSIS — I4892 Unspecified atrial flutter: Secondary | ICD-10-CM | POA: Diagnosis not present

## 2020-02-05 DIAGNOSIS — I471 Supraventricular tachycardia: Secondary | ICD-10-CM | POA: Diagnosis not present

## 2020-02-11 DIAGNOSIS — I513 Intracardiac thrombosis, not elsewhere classified: Secondary | ICD-10-CM | POA: Diagnosis not present

## 2020-02-11 DIAGNOSIS — I2102 ST elevation (STEMI) myocardial infarction involving left anterior descending coronary artery: Secondary | ICD-10-CM | POA: Diagnosis not present

## 2020-02-11 DIAGNOSIS — I480 Paroxysmal atrial fibrillation: Secondary | ICD-10-CM | POA: Diagnosis not present

## 2020-02-11 DIAGNOSIS — I951 Orthostatic hypotension: Secondary | ICD-10-CM | POA: Diagnosis not present

## 2020-02-11 DIAGNOSIS — I5020 Unspecified systolic (congestive) heart failure: Secondary | ICD-10-CM | POA: Diagnosis not present

## 2020-02-22 ENCOUNTER — Other Ambulatory Visit: Payer: Self-pay

## 2020-02-22 ENCOUNTER — Encounter: Payer: Self-pay | Admitting: Neurology

## 2020-02-22 ENCOUNTER — Ambulatory Visit (INDEPENDENT_AMBULATORY_CARE_PROVIDER_SITE_OTHER): Payer: Medicare Other | Admitting: Neurology

## 2020-02-22 VITALS — BP 94/57 | HR 73 | Ht 67.0 in | Wt 202.0 lb

## 2020-02-22 DIAGNOSIS — G63 Polyneuropathy in diseases classified elsewhere: Secondary | ICD-10-CM | POA: Diagnosis not present

## 2020-02-22 NOTE — Patient Instructions (Addendum)
Continue gabapentin 300mg  at bedtime  Check your feet daily  Use a cane on uneven ground  Return to clinic in as needed

## 2020-02-22 NOTE — Progress Notes (Signed)
[facility] Clinic Note - Initial Visit   Date: 02/22/20  Jenna [name] MRN: [id number] DOB: 10/21/1955   Dear Dr. [name]:  Thank you for your kind referral of Jenna [name] for consultation of bilateral feet paresthesias. Although her history is well known to you, please allow [name] to reiterate it for the purpose of our medical record. The patient was accompanied to the clinic by self.    History of Present Illness: Jenna [name] is a 65 y.o. right-handed female with SLE complicated by renal disease s/p kidney transplant, CAD s.p PCI, and anxiety presenting for evaluation of bilateral feet paresthesias. For the past several years, she noticed having tingling and numbness in the toes and balls of the feet.  Initially, she was having a lot of firey pain, but this has improved since taking gabapentin $RemoveBeforeDEI'300mg'OtGoKSWyysEJyEiA$  at bedtime.  She has some imbalance and has been using a cane for the past year.  He is doing home PT for balance.  She denies weakness.   No history of diabetes, alcohol abuse, or family history of neuropathy.   Out-side paper records, electronic medical record, and images have been reviewed where available and summarized as:  Lab Results  Component Value Date   HGBA1C 5.30 10/31/2014   Lab Results  Component Value Date   BMWUXLKG40 102 04/04/2019   Lab Results  Component Value Date   TSH 3.10 04/04/2019   Lab Results  Component Value Date   ESRSEDRATE 133 (H) 01/20/2010    Past Medical History:  Diagnosis Date  . Anxiety    panic attack- talks herself and takes deep breathes  . Depression   . Dyspnea    with exertion   . ESRD (end stage renal disease) ([place])    hemo TTHSAT  . FSGS (focal segmental glomerulosclerosis) 2011   By renal biopsy  . Head injury    age 66  . History of kidney stones    kidney stone  . Hx of lupus nephritis 2011   by renal biopsy  . Lupus (systemic lupus erythematosus) ([name])    followed by Dr.  Amil Amen  . Obesity   . Pericarditis    age 46ish  . Renal stone   . Secondary hyperparathyroidism of renal origin ([place])   . TIA (transient ischemic attack)    no residual effects    Past Surgical History:  Procedure Laterality Date  . AV FISTULA PLACEMENT Left 07/04/2017   Procedure: ARTERIOVENOUS (AV) FISTULA CREATION BRACHIOCEPHALIC;  Surgeon: [name], MD;  Location: [facility] OR;  Service: Vascular;  Laterality: Left;  . COLONOSCOPY W/ POLYPECTOMY    . IR FLUORO GUIDE CV LINE RIGHT  06/28/2017  . IR US GUIDE VASC ACCESS RIGHT  06/28/2017  . KIDNEY SURGERY     kidney transplant 2020  . REVISION OF ARTERIOVENOUS GORETEX GRAFT Left 11/30/2017   Procedure: REVISION OF ARTERIOVENOUS FISTULA LEFT ARM Superfistulization and branch ligation.;  Surgeon: Waynetta Sandy, MD;  Location: [facility];  Service: Vascular;  Laterality: Left;     Medications:  Outpatient Encounter Medications as of 02/22/2020  Medication Sig Note  . acetaminophen (TYLENOL) 500 MG tablet Take 1,000 mg by mouth every 6 (six) hours as needed for headache.   Marland Kitchen aspirin 81 MG EC tablet Take by mouth.   . escitalopram (LEXAPRO) 5 MG tablet Take 5 mg by mouth daily.   Marland Kitchen gabapentin (NEURONTIN) 300 MG capsule Take 1 capsule (300 mg total) by mouth daily.   Marland Kitchen  hydroxychloroquine (PLAQUENIL) 200 MG tablet Take 1 tablet (200 mg total) by mouth 2 (two) times daily. (Patient taking differently: Take 200 mg by mouth daily.)   . lidocaine (LIDODERM) 5 % Place 1 patch onto the skin daily. Remove & Discard patch within 12 hours or as directed by MD   . mycophenolate (MYFORTIC) 180 MG EC tablet Take 180 mg by mouth 2 (two) times daily. 06/22/2019: Pt took tonight's pills in room with MD permission per daughter.  . Naphazoline HCl (CLEAR EYES OP) Place 1 drop into both eyes daily as needed (dry eyes).   . nitroGLYCERIN (NITROSTAT) 0.4 MG SL tablet Place 0.4 mg under the tongue every 5 (five) minutes as needed for chest pain.  06/22/2019:  1 tablet this morning.  . predniSONE (DELTASONE) 5 MG tablet Take 5 mg by mouth daily. 06/22/2019: Continuous course  . rosuvastatin (CRESTOR) 5 MG tablet Take 5 mg by mouth daily at 6 PM. 06/22/2019: Pt took tonight's pills in room with MD permission per daughter.  . sulfamethoxazole-trimethoprim (BACTRIM) 400-80 MG tablet Take 1 tablet by mouth every Monday, Wednesday, and Friday. Take 1 tablet by mouth every Monday, Wednesday, and Friday.   . tacrolimus (PROGRAF) 0.5 MG capsule Take 0.5 mg by mouth 2 (two) times daily. 1.$RemoveBefore'5mg'aHqMSKtsClUbb$  in the am & $Re'1mg'KBZ$  in pm 06/22/2019: Pt took tonight's pills in room with MD permission per daughter.  . torsemide (DEMADEX) 20 MG tablet Take 40 mg by mouth daily. 06/22/2019: Pt took 1 tablet tonight with night pills because she forgot this morning. Pt took tonight's pills in room with MD permission per daughter.  . valsartan (DIOVAN) 40 MG tablet Take 0.5 tablets (20 mg total) by mouth daily. (Patient not taking: Reported on 02/22/2020)    No facility-administered encounter medications on file as of 02/22/2020.    Allergies:  Allergies  Allergen Reactions  . Enalapril Maleate Anaphylaxis and Other (See Comments)    Throat swells  . Penicillins Other (See Comments)    Made patient lightheaded PATIENT HAS HAD A PCN REACTION WITH IMMEDIATE RASH, FACIAL/TONGUE/THROAT SWELLING, SOB, OR LIGHTHEADEDNESS WITH HYPOTENSION:  #  #  YES  #  #  Has patient had a PCN reaction causing severe rash involving mucus membranes or skin necrosis: No Has patient had a PCN reaction that required hospitalization: No Has patient had a PCN reaction occurring within the last 10 years: No If all of the above answers are "NO", then may proceed with Cephalosporin use.   . Chocolate Nausea And Vomiting  . Tape Itching, Rash and Other (See Comments)    Family History: Family History  Problem Relation Age of Onset  . Diabetes Mother   . Hypertension Father   . Cancer Sister   . Hypertension Brother      Social History: Social History   Tobacco Use  . Smoking status: Never Smoker  . Smokeless tobacco: Never Used  Vaping Use  . Vaping Use: Never used  Substance Use Topics  . Alcohol use: No  . Drug use: No   Social History   Social History Narrative   Right Handed   Lives in a two story home   Daughter recently got married     Vital Signs:  BP (!) 94/57   Pulse 73   Ht $R'5\' 7"'Ly$  (1.702 m)   Wt 202 lb (91.6 kg)   SpO2 100%   BMI 31.64 kg/m    Neurological Exam: MENTAL STATUS including orientation to time, place, person, recent  and remote memory, attention span and concentration, language, and fund of knowledge is normal.  Speech is not dysarthric.  CRANIAL NERVES: II:  No visual field defects.   III-IV-VI: Pupils equal round and reactive to light.  Normal conjugate, extra-ocular eye movements in all directions of gaze.  No nystagmus.  No ptosis.   V:  Normal facial sensation.    VII:  Normal facial symmetry and movements.   VIII:  Normal hearing and vestibular function.   IX-X:  Normal palatal movement.   XI:  Normal shoulder shrug and head rotation.   XII:  Normal tongue strength and range of motion, no deviation or fasciculation.  MOTOR:  No atrophy, fasciculations or abnormal movements.  No pronator drift.   Upper Extremity:  Right  Left  Deltoid  5/5   5/5   Biceps  5/5   5/5   Triceps  5/5   5/5   Infraspinatus 5/5  5/5  Medial pectoralis 5/5  5/5  Wrist extensors  5/5   5/5   Wrist flexors  5/5   5/5   Finger extensors  5/5   5/5   Finger flexors  5/5   5/5   Dorsal interossei  5/5   5/5   Abductor pollicis  5/5   5/5   Tone (Ashworth scale)  0  0   Lower Extremity:  Right  Left  Hip flexors  5/5   5/5   Hip extensors  5/5   5/5   Adductor 5/5  5/5  Abductor 5/5  5/5  Knee flexors  5/5   5/5   Knee extensors  5/5   5/5   Dorsiflexors  5/5   5/5   Plantarflexors  5/5   5/5   Toe extensors  5/5   5/5   Toe flexors  5/5   5/5   Tone (Ashworth  scale)  0  0   MSRs:  Right        Left                  brachioradialis 2+  2+  biceps 2+  2+  triceps 2+  2+  patellar tr  tr  ankle jerk 0  0  Hoffman no  no  plantar response down  down   SENSORY:  Vibration diminished distal to ankles bilaterally, absent at the great toe.  Pin prick and temperature reduced in the feet.   COORDINATION/GAIT: Normal finger-to- nose-finger.  Intact rapid alternating movements bilaterally.  Gait appears slow, assisted with a cane.     IMPRESSION: Peripheral neuropathy manifesting with distal presthesias affecting the feet. Risk factors:  SLE, history of renal disease. I had extensive discussion with the patient regarding the pathogenesis, etiology, management, and natural course of neuropathy. Neuropathy tends to be slowly progressive and management is symptomatic.   History and exam consistent with neuropathy, NCS/EMG declined.  She has adequate relief of pain on gabapentin $RemoveBefor'300mg'xFmDpXvBSrrP$  at bedtime Patient educated on daily foot inspection, fall prevention, and safety precautions around the home.  Return to clinic as needed   Thank you for allowing me to participate in patient's care.  If I can answer any additional questions, I would be pleased to do so.    Sincerely,    Hazle Ogburn K. Posey Pronto, DO

## 2020-03-10 ENCOUNTER — Ambulatory Visit: Payer: Medicare Other | Admitting: Neurology

## 2020-03-27 ENCOUNTER — Other Ambulatory Visit: Payer: Self-pay

## 2020-03-28 ENCOUNTER — Ambulatory Visit (INDEPENDENT_AMBULATORY_CARE_PROVIDER_SITE_OTHER): Payer: Medicare Other | Admitting: Adult Health

## 2020-03-28 ENCOUNTER — Ambulatory Visit (INDEPENDENT_AMBULATORY_CARE_PROVIDER_SITE_OTHER): Payer: Medicare Other

## 2020-03-28 ENCOUNTER — Encounter: Payer: Self-pay | Admitting: Adult Health

## 2020-03-28 VITALS — BP 110/72 | Temp 98.0°F | Wt 207.0 lb

## 2020-03-28 DIAGNOSIS — R1011 Right upper quadrant pain: Secondary | ICD-10-CM | POA: Diagnosis not present

## 2020-03-28 NOTE — Patient Instructions (Signed)
It was great meeting you today   I am going to get an xray of the chest and abdomen today.   We will follow up with you regarding your blood work   In the meantime, continue to use a heating pad and tylenol

## 2020-03-28 NOTE — Progress Notes (Signed)
Subjective:    Patient ID: Jenna [name], female    DOB: Jul 02, 1955, 65 y.o.   MRN: [id number]  HPI 65 year old female who  has a past medical history of Anxiety, Depression, Dyspnea, ESRD (end stage renal disease) ([name]), FSGS (focal segmental glomerulosclerosis) (2011), Head injury, History of kidney stones, lupus nephritis (2011), Lupus (systemic lupus erythematosus) ([name]), Obesity, Pericarditis, Renal stone, Secondary hyperparathyroidism of renal origin ([name]), and TIA (transient ischemic attack).  She presents to the office today for an acute issue of right upper quadrant pain that radiates into her lower back. Her symptoms started about 10 days ago. Unable to describe the pain " it hurts". Pain is worse with inspiration but is constant.   She denies nausea or vomiting. Does not recall injury or trauma   She denies dysuria or constipation. Had a BM this morning which was soft.   Pain is not worse with eating   She was seen at [facility] 3 days ago at which time she had blood work and urinalysis done.all were stable( including CMP and UA)  with no signs of infection. Symptoms were present at that time.   Symptoms have not worsen over the last 10 days   Heating pad helps the most. Tylenol does not help much    Review of Systems See HPI   Past Medical History:  Diagnosis Date  . Anxiety    panic attack- talks herself and takes deep breathes  . Depression   . Dyspnea    with exertion   . ESRD (end stage renal disease) ([name])    hemo [facility]SAT  . FSGS (focal segmental glomerulosclerosis) 2011   By renal biopsy  . Head injury    age 64  . History of kidney stones    kidney stone  . Hx of lupus nephritis 2011   by renal biopsy  . Lupus (systemic lupus erythematosus) ([name])    followed by Dr. Dierdre Forth  . Obesity   . Pericarditis    age 58ish  . Renal stone   . Secondary hyperparathyroidism of renal origin ([name])   . TIA (transient ischemic attack)    no residual  effects    Social History   Socioeconomic History  . Marital status: Legally Separated    Spouse name: Not on file  . Number of children: 1  . Years of education: Not on file  . Highest education level: Not on file  Occupational History  . Not on file  Tobacco Use  . Smoking status: Never Smoker  . Smokeless tobacco: Never Used  Vaping Use  . Vaping Use: Never used  Substance and Sexual Activity  . Alcohol use: No  . Drug use: No  . Sexual activity: Not Currently  Other Topics Concern  . Not on file  Social History Narrative   Right Handed   Lives in a two story home   Daughter recently got married    Social Determinants of Health   Financial Resource Strain: Not on file  Food Insecurity: Not on file  Transportation Needs: Not on file  Physical Activity: Not on file  Stress: Not on file  Social Connections: Not on file  Intimate Partner Violence: Not on file    Past Surgical History:  Procedure Laterality Date  . AV FISTULA PLACEMENT Left 07/04/2017   Procedure: ARTERIOVENOUS (AV) FISTULA CREATION BRACHIOCEPHALIC;  Surgeon: Larina Earthly, MD;  Location: [facility] OR;  Service: Vascular;  Laterality: Left;  . COLONOSCOPY  W/ POLYPECTOMY    . IR FLUORO GUIDE CV LINE RIGHT  06/28/2017  . IR US GUIDE VASC ACCESS RIGHT  06/28/2017  . KIDNEY SURGERY     kidney transplant 2020  . REVISION OF ARTERIOVENOUS GORETEX GRAFT Left 11/30/2017   Procedure: REVISION OF ARTERIOVENOUS FISTULA LEFT ARM Superfistulization and branch ligation.;  Surgeon: Waynetta Sandy, MD;  Location: [facility] OR;  Service: Vascular;  Laterality: Left;    Family History  Problem Relation Age of Onset  . Diabetes Mother   . Hypertension Father   . Cancer Sister   . Hypertension Brother     Allergies  Allergen Reactions  . Enalapril Maleate Anaphylaxis and Other (See Comments)    Throat swells  . Penicillins Other (See Comments)    Made patient lightheaded PATIENT HAS HAD A PCN REACTION WITH  IMMEDIATE RASH, FACIAL/TONGUE/THROAT SWELLING, SOB, OR LIGHTHEADEDNESS WITH HYPOTENSION:  #  #  YES  #  #  Has patient had a PCN reaction causing severe rash involving mucus membranes or skin necrosis: No Has patient had a PCN reaction that required hospitalization: No Has patient had a PCN reaction occurring within the last 10 years: No If all of the above answers are "NO", then may proceed with Cephalosporin use.   . Chocolate Nausea And Vomiting  . Tape Itching, Rash and Other (See Comments)    Current Outpatient Medications on File Prior to Visit  Medication Sig Dispense Refill  . acetaminophen (TYLENOL) 500 MG tablet Take 1,000 mg by mouth every 6 (six) hours as needed for headache.    Marland Kitchen aspirin 81 MG EC tablet Take by mouth.    . escitalopram (LEXAPRO) 5 MG tablet Take 5 mg by mouth daily.    Marland Kitchen gabapentin (NEURONTIN) 300 MG capsule Take 1 capsule (300 mg total) by mouth daily. 30 capsule 3  . hydroxychloroquine (PLAQUENIL) 200 MG tablet Take 1 tablet (200 mg total) by mouth 2 (two) times daily. (Patient taking differently: Take 200 mg by mouth daily.) 180 tablet 3  . lidocaine (LIDODERM) 5 % Place 1 patch onto the skin daily. Remove & Discard patch within 12 hours or as directed by MD 30 patch 6  . mycophenolate (MYFORTIC) 180 MG EC tablet Take 180 mg by mouth 2 (two) times daily.    . Naphazoline HCl (CLEAR EYES OP) Place 1 drop into both eyes daily as needed (dry eyes).    . nitroGLYCERIN (NITROSTAT) 0.4 MG SL tablet Place 0.4 mg under the tongue every 5 (five) minutes as needed for chest pain.     . predniSONE (DELTASONE) 5 MG tablet Take 5 mg by mouth daily.    . rosuvastatin (CRESTOR) 5 MG tablet Take 5 mg by mouth daily at 6 PM.    . sulfamethoxazole-trimethoprim (BACTRIM) 400-80 MG tablet Take 1 tablet by mouth every Monday, Wednesday, and Friday. Take 1 tablet by mouth every Monday, Wednesday, and Friday.    . tacrolimus (PROGRAF) 0.5 MG capsule Take 0.5 mg by mouth 2 (two)  times daily. 1.$RemoveBefore'5mg'cpJIeCkKgdayo$  in the am & $Re'1mg'BaG$  in pm    . torsemide (DEMADEX) 20 MG tablet Take 40 mg by mouth daily.    . valsartan (DIOVAN) 40 MG tablet Take 0.5 tablets (20 mg total) by mouth daily. (Patient not taking: Reported on 02/22/2020) 30 tablet 1   No current facility-administered medications on file prior to visit.    BP 110/72   Temp 98 F (36.7 C)   Wt 207 lb (93.9  kg)   BMI 32.42 kg/m       Objective:   Physical Exam Vitals and nursing note reviewed.  Constitutional:      Appearance: Normal appearance.  Cardiovascular:     Rate and Rhythm: Normal rate and regular rhythm.     Pulses: Normal pulses.     Heart sounds: Normal heart sounds.  Pulmonary:     Effort: Pulmonary effort is normal.     Breath sounds: Normal breath sounds.  Abdominal:     General: Abdomen is flat. Bowel sounds are normal.     Palpations: Abdomen is soft.     Tenderness: There is abdominal tenderness in the right upper quadrant. There is no right CVA tenderness or left CVA tenderness.     Hernia: No hernia is present.  Skin:    General: Skin is warm and dry.     Findings: No rash.  Neurological:     General: No focal deficit present.     Mental Status: She is alert and oriented to person, place, and time.       Assessment & Plan:  1. Right upper quadrant pain - No signs of shingles rash, has pain with palpation to RUQ and right flank. Will order KUB and chest xray. Consider [name] RUQ  If needed. Possible muscle strain vs kidney stone vs gallbladder disease. Since lab work was done three days ago, I do not see it necessary to redo labs.  - DG Abd Acute W/Chest; Future  Dorothyann Peng, NP

## 2020-04-01 ENCOUNTER — Telehealth: Payer: Self-pay | Admitting: Family Medicine

## 2020-04-01 NOTE — Telephone Encounter (Signed)
Pt is returning the call to [facility] with a question about a procedure that she is going to be having an stated that she was told not to drink or eat but she has kidney issues and have to drink a small about daily.  Pt would like to have a call back.

## 2020-04-01 NOTE — Telephone Encounter (Signed)
Have her restrict to about 1500 ml or 50 oz of fluid for the next two days. If she continues to have discomfort then follow up with PCP

## 2020-04-01 NOTE — Telephone Encounter (Signed)
Spoke to the pt and informed her of the below message.  She will call back if necessary.

## 2020-04-01 NOTE — Telephone Encounter (Signed)
Jenna [name],  I spoke to the pt.  She is concerned about her new kidneys not getting enough fluid with the restriction of fluids and food.  What is the minimum that you think she should drink/consume?

## 2020-05-10 IMAGING — DX CHEST - 2 VIEW
2 series · 2 of 2 positions shown · non-contrast
Comparison: Chest radiograph dated 09/18/2017

CLINICAL DATA: 63-year-old female with chest pain.

EXAM:
CHEST - 2 VIEW

[chest pa]
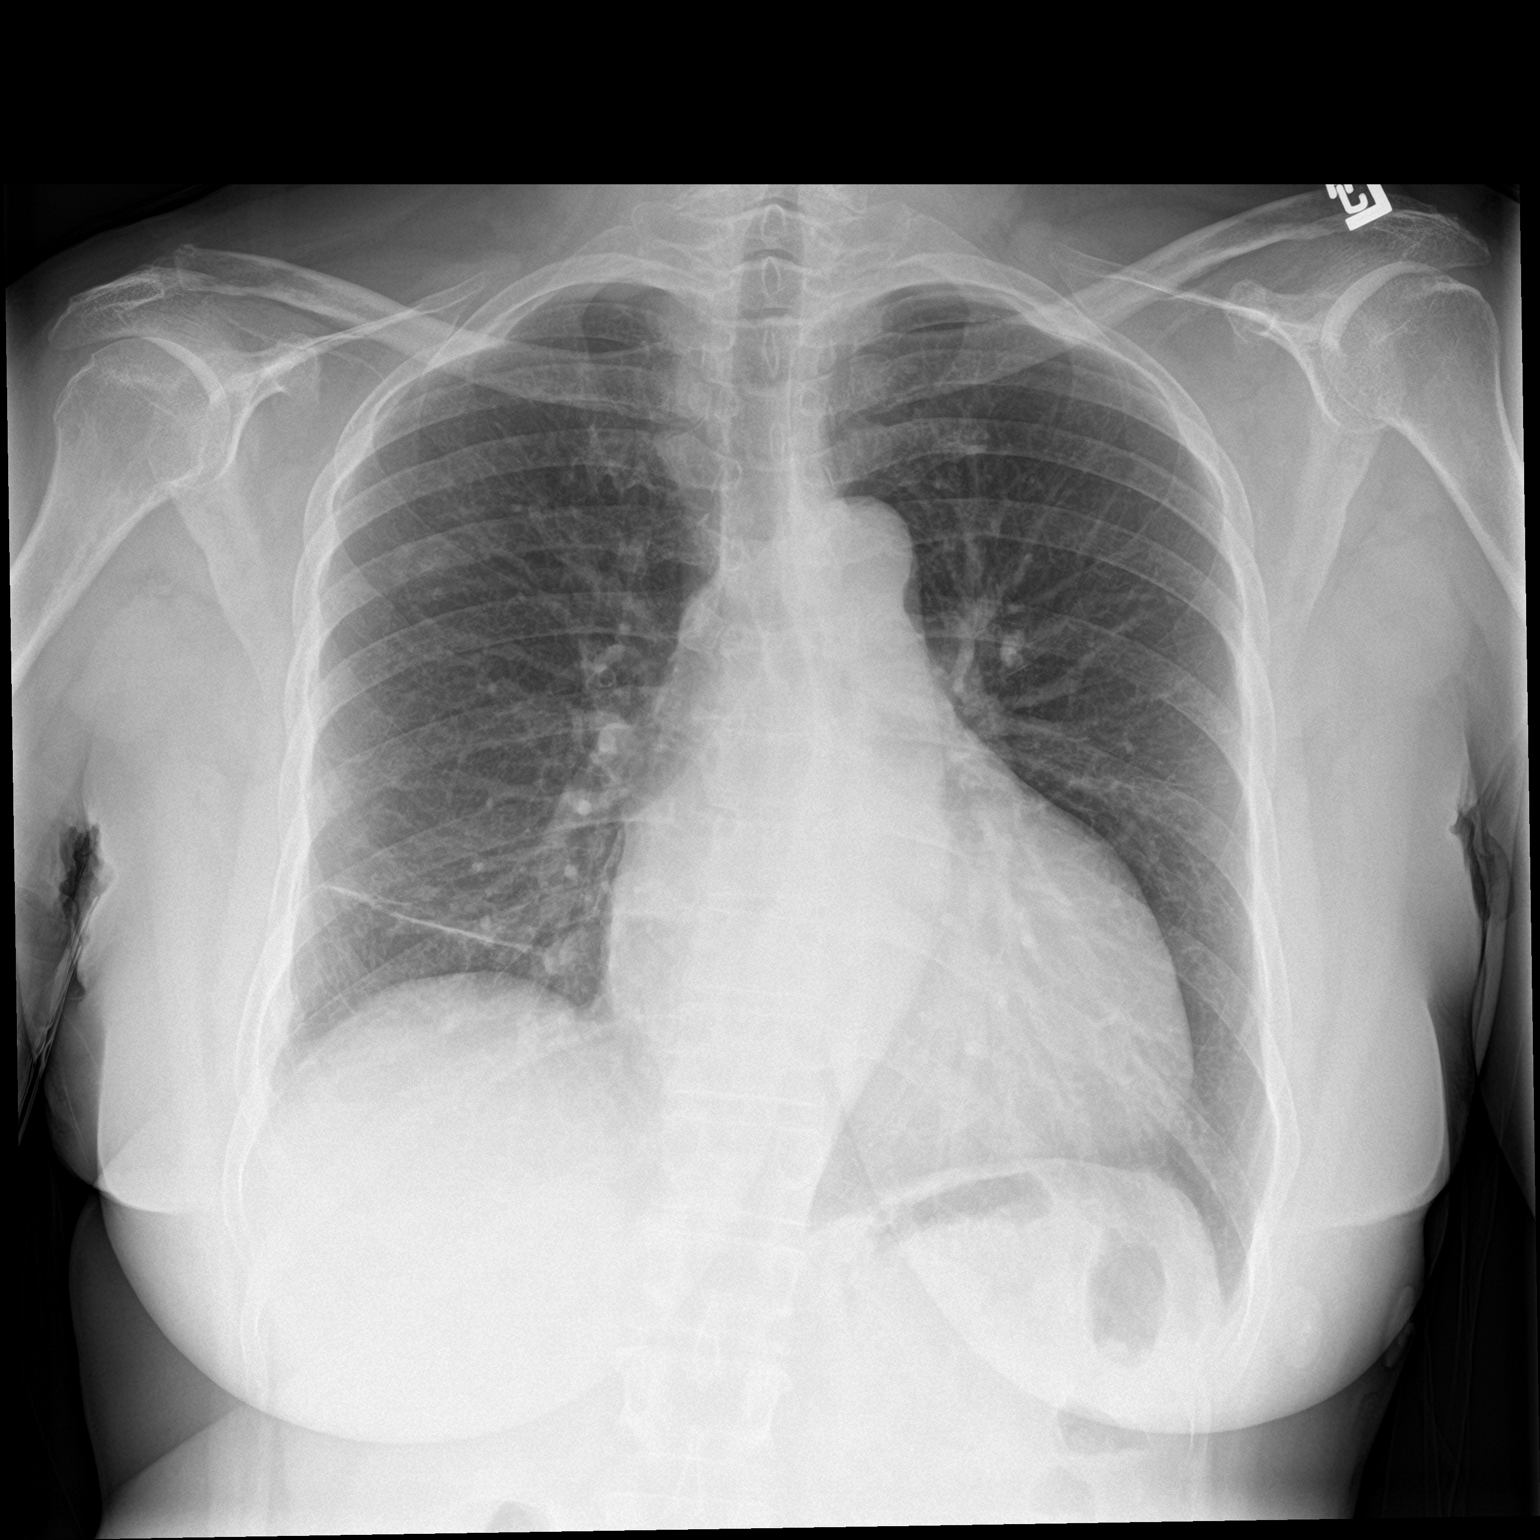

[chest lat]
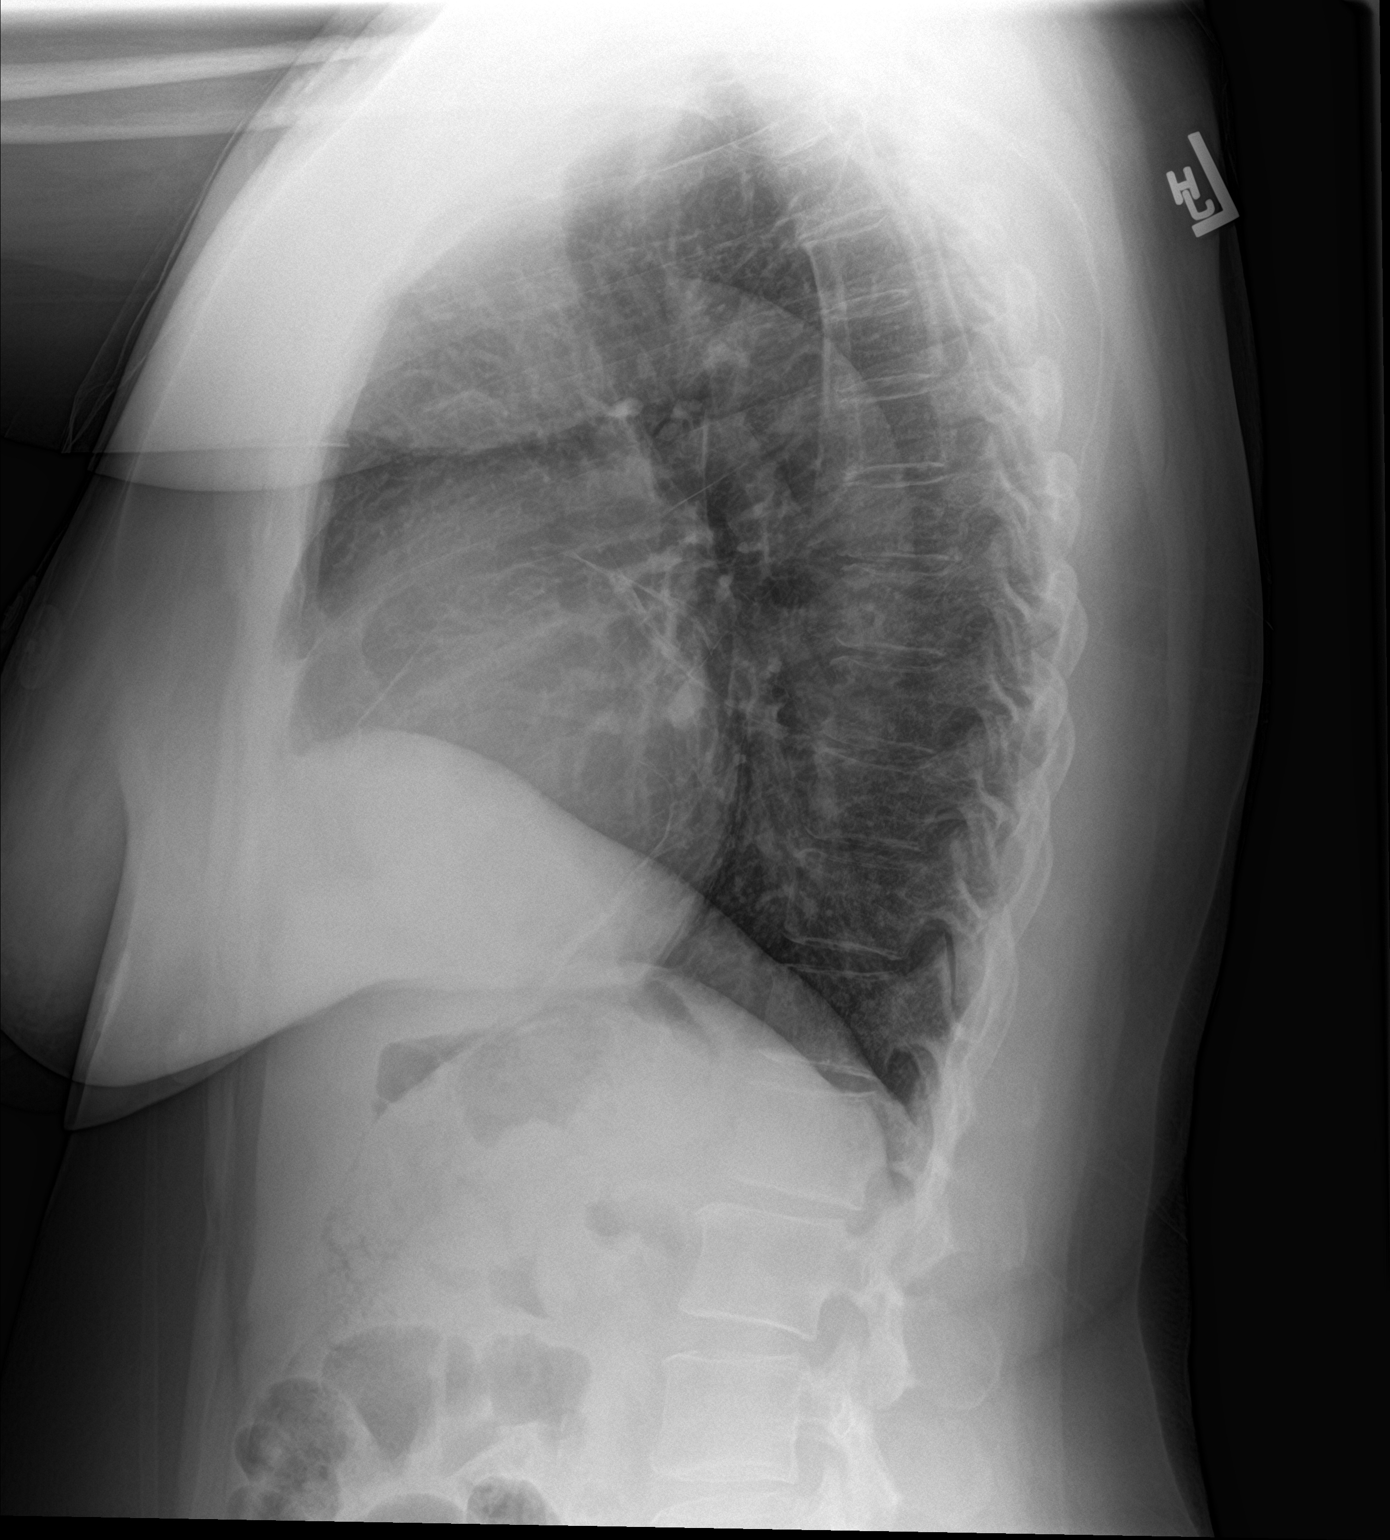

[2 of 2 positions shown; findings below may reference images not displayed]

FINDINGS: Linear the right lung base atelectasis/scarring. There is no focal
consolidation, pleural effusion, or pneumothorax. Stable mild
cardiomegaly. No acute osseous pathology.
IMPRESSION: No active cardiopulmonary disease.

## 2020-05-20 ENCOUNTER — Telehealth: Payer: Self-pay | Admitting: Family Medicine

## 2020-05-20 NOTE — Telephone Encounter (Signed)
The patient if wanting a referral for Neurology to a different doctor. She felt like the last doctor that she saw didn't really listen to her and didn't give her the answers that she was wanting.  She was also wanting to know if she takes 2 tablets of the Gabapentin if it will hurt her kidneys.  I have scheduled the patient with Dr. [name] on Friday 04/08.

## 2020-05-20 NOTE — Telephone Encounter (Signed)
Patient is coming in on Friday.

## 2020-05-20 NOTE — Telephone Encounter (Signed)
Noted. Will address these concerns during visit. Sarahlynn Cisnero [name], MD

## 2020-05-23 ENCOUNTER — Ambulatory Visit: Payer: Medicare Other | Admitting: Family Medicine

## 2020-05-27 ENCOUNTER — Other Ambulatory Visit: Payer: Self-pay

## 2020-05-27 ENCOUNTER — Ambulatory Visit (INDEPENDENT_AMBULATORY_CARE_PROVIDER_SITE_OTHER): Payer: Medicare Other

## 2020-05-27 DIAGNOSIS — Z0000 Encounter for general adult medical examination without abnormal findings: Secondary | ICD-10-CM | POA: Diagnosis not present

## 2020-05-27 NOTE — Patient Instructions (Signed)
[name] , Thank you for taking time to come for your Medicare Wellness Visit. I appreciate your ongoing commitment to your health goals. Please review the following plan we discussed and let me know if I can assist you in the future.   Screening recommendations/referrals: Colonoscopy: Done 08/25/12 Mammogram: Done 02/22/19 Bone Density: Done 12/28/18 Recommended yearly ophthalmology/optometry visit for glaucoma screening and checkup Recommended yearly dental visit for hygiene and checkup  Vaccinations: Influenza vaccine: Up to date Pneumococcal vaccine: Up to date Tdap vaccine: Up to date Shingles vaccine: Shingrix discussed. Please contact your pharmacy for coverage information.   Covid-19: Completed 3/27, 4/14, & 02/06/20  Advanced directives: Advance directive discussed with you today. I have provided a copy for you to complete at home and have notarized. Once this is complete please bring a copy in to our office so we can scan it into your chart.  Conditions/risks identified: Get stronger   Next appointment: Follow up in one year for your annual wellness visit.   Preventive Care 40-64 Years, Female Preventive care refers to lifestyle choices and visits with your health care provider that can promote health and wellness. What does preventive care include?  A yearly physical exam. This is also called an annual well check.  Dental exams once or twice a year.  Routine eye exams. Ask your health care provider how often you should have your eyes checked.  Personal lifestyle choices, including:  Daily care of your teeth and gums.  Regular physical activity.  Eating a healthy diet.  Avoiding tobacco and drug use.  Limiting alcohol use.  Practicing safe sex.  Taking low-dose aspirin daily starting at age 73.  Taking vitamin and mineral supplements as recommended by your health care provider. What happens during an annual well check? The services and screenings done by  your health care provider during your annual well check will depend on your age, overall health, lifestyle risk factors, and family history of disease. Counseling  Your health care provider may ask you questions about your:  Alcohol use.  Tobacco use.  Drug use.  Emotional well-being.  Home and relationship well-being.  Sexual activity.  Eating habits.  Work and work Statistician.  Method of birth control.  Menstrual cycle.  Pregnancy history. Screening  You may have the following tests or measurements:  Height, weight, and BMI.  Blood pressure.  Lipid and cholesterol levels. These may be checked every 5 years, or more frequently if you are over 87 years old.  Skin check.  Lung cancer screening. You may have this screening every year starting at age 78 if you have a 30-pack-year history of smoking and currently smoke or have quit within the past 15 years.  Fecal occult blood test (FOBT) of the stool. You may have this test every year starting at age 29.  Flexible sigmoidoscopy or colonoscopy. You may have a sigmoidoscopy every 5 years or a colonoscopy every 10 years starting at age 93.  Hepatitis C blood test.  Hepatitis B blood test.  Sexually transmitted disease (STD) testing.  Diabetes screening. This is done by checking your blood sugar (glucose) after you have not eaten for a while (fasting). You may have this done every 1-3 years.  Mammogram. This may be done every 1-2 years. Talk to your health care provider about when you should start having regular mammograms. This may depend on whether you have a family history of breast cancer.  BRCA-related cancer screening. This may be done if you have  a family history of breast, ovarian, tubal, or peritoneal cancers.  Pelvic exam and Pap test. This may be done every 3 years starting at age 73. Starting at age 38, this may be done every 5 years if you have a Pap test in combination with an HPV test.  Bone density  scan. This is done to screen for osteoporosis. You may have this scan if you are at high risk for osteoporosis. Discuss your test results, treatment options, and if necessary, the need for more tests with your health care provider. Vaccines  Your health care provider may recommend certain vaccines, such as:  Influenza vaccine. This is recommended every year.  Tetanus, diphtheria, and acellular pertussis (Tdap, Td) vaccine. You may need a Td booster every 10 years.  Zoster vaccine. You may need this after age 73.  Pneumococcal 13-valent conjugate (PCV13) vaccine. You may need this if you have certain conditions and were not previously vaccinated.  Pneumococcal polysaccharide (PPSV23) vaccine. You may need one or two doses if you smoke cigarettes or if you have certain conditions. Talk to your health care provider about which screenings and vaccines you need and how often you need them. This information is not intended to replace advice given to you by your health care provider. Make sure you discuss any questions you have with your health care provider. Document Released: 02/28/2015 Document Revised: 10/22/2015 Document Reviewed: 12/03/2014 Elsevier Interactive Patient Education  2017 [employer] Prevention in the Home Falls can cause injuries. They can happen to people of all ages. There are many things you can do to make your home safe and to help prevent falls. What can I do on the outside of my home?  Regularly fix the edges of walkways and driveways and fix any cracks.  Remove anything that might make you trip as you walk through a door, such as a raised step or threshold.  Trim any bushes or trees on the path to your home.  Use bright outdoor lighting.  Clear any walking paths of anything that might make someone trip, such as rocks or tools.  Regularly check to see if handrails are loose or broken. Make sure that both sides of any steps have handrails.  Any raised  decks and porches should have guardrails on the edges.  Have any leaves, snow, or ice cleared regularly.  Use sand or salt on walking paths during winter.  Clean up any spills in your garage right away. This includes oil or grease spills. What can I do in the bathroom?  Use night lights.  Install grab bars by the toilet and in the tub and shower. Do not use towel bars as grab bars.  Use non-skid mats or decals in the tub or shower.  If you need to sit down in the shower, use a plastic, non-slip stool.  Keep the floor dry. Clean up any water that spills on the floor as soon as it happens.  Remove soap buildup in the tub or shower regularly.  Attach bath mats securely with double-sided non-slip rug tape.  Do not have throw rugs and other things on the floor that can make you trip. What can I do in the bedroom?  Use night lights.  Make sure that you have a light by your bed that is easy to reach.  Do not use any sheets or blankets that are too big for your bed. They should not hang down onto the floor.  Have a  firm chair that has side arms. You can use this for support while you get dressed.  Do not have throw rugs and other things on the floor that can make you trip. What can I do in the kitchen?  Clean up any spills right away.  Avoid walking on wet floors.  Keep items that you use a lot in easy-to-reach places.  If you need to reach something above you, use a strong step stool that has a grab bar.  Keep electrical cords out of the way.  Do not use floor polish or wax that makes floors slippery. If you must use wax, use non-skid floor wax.  Do not have throw rugs and other things on the floor that can make you trip. What can I do with my stairs?  Do not leave any items on the stairs.  Make sure that there are handrails on both sides of the stairs and use them. Fix handrails that are broken or loose. Make sure that handrails are as long as the stairways.  Check  any carpeting to make sure that it is firmly attached to the stairs. Fix any carpet that is loose or worn.  Avoid having throw rugs at the top or bottom of the stairs. If you do have throw rugs, attach them to the floor with carpet tape.  Make sure that you have a light switch at the top of the stairs and the bottom of the stairs. If you do not have them, ask someone to add them for you. What else can I do to help prevent falls?  Wear shoes that:  Do not have high heels.  Have rubber bottoms.  Are comfortable and fit you well.  Are closed at the toe. Do not wear sandals.  If you use a stepladder:  Make sure that it is fully opened. Do not climb a closed stepladder.  Make sure that both sides of the stepladder are locked into place.  Ask someone to hold it for you, if possible.  Clearly mark and make sure that you can see:  Any grab bars or handrails.  First and last steps.  Where the edge of each step is.  Use tools that help you move around (mobility aids) if they are needed. These include:  Canes.  Walkers.  Scooters.  Crutches.  Turn on the lights when you go into a dark area. Replace any light bulbs as soon as they burn out.  Set up your furniture so you have a clear path. Avoid moving your furniture around.  If any of your floors are uneven, fix them.  If there are any pets around you, be aware of where they are.  Review your medicines with your doctor. Some medicines can make you feel dizzy. This can increase your chance of falling. Ask your doctor what other things that you can do to help prevent falls. This information is not intended to replace advice given to you by your health care provider. Make sure you discuss any questions you have with your health care provider. Document Released: 11/28/2008 Document Revised: 07/10/2015 Document Reviewed: 03/08/2014 Elsevier Interactive Patient Education  2017 [employer].

## 2020-05-27 NOTE — Progress Notes (Addendum)
Virtual Visit via Telephone Note  I connected with  Jenna [name] on 05/27/20 at 11:00 AM EDT by telephone and verified that I am speaking with the correct person using two identifiers.  Location: Patient: Home Provider: Office Persons participating in the virtual visit: patient/Nurse Health Advisor   I discussed the limitations, risks, security and privacy concerns of performing an evaluation and management service by telephone and the availability of in person appointments. The patient expressed understanding and agreed to proceed.  Interactive audio and video telecommunications were attempted between this nurse and patient, however failed, due to patient having technical difficulties OR patient did not have access to video capability.  We continued and completed visit with audio only.  Some vital signs may be absent or patient reported.   Willette Brace, LPN    Subjective:   Jenna [name] is a 65 y.o. female who presents for Medicare Annual (Subsequent) preventive examination.  Review of Systems     Cardiac Risk Factors include: advanced age (>23men, >66 women);hypertension;dyslipidemia;obesity (BMI >30kg/m2)     Objective:    Today's Vitals   05/27/20 1053  PainSc: 5    There is no height or weight on file to calculate BMI.  Advanced Directives 05/27/2020 02/22/2020 08/15/2019 06/22/2019 11/30/2017 11/11/2017 09/18/2017  Does Patient Have a Medical Advance Directive? No No No No No No No  Does patient want to make changes to medical advance directive? Yes (MAU/Ambulatory/Procedural Areas - Information given) - - - - - -  Would patient like information on creating a medical advance directive? - - Yes (ED - Information included in AVS) No - Patient declined No - Patient declined - No - Patient declined  Pre-existing out of facility DNR order (yellow form or pink MOST form) - - - - - - -    Current Medications (verified) Outpatient Encounter Medications as of 05/27/2020   Medication Sig  . acetaminophen (TYLENOL) 500 MG tablet Take 1,000 mg by mouth every 6 (six) hours as needed for headache.  Marland Kitchen aspirin 81 MG EC tablet Take by mouth.  . escitalopram (LEXAPRO) 5 MG tablet Take 5 mg by mouth daily.  Marland Kitchen gabapentin (NEURONTIN) 300 MG capsule Take 1 capsule (300 mg total) by mouth daily.  . hydroxychloroquine (PLAQUENIL) 200 MG tablet Take 1 tablet (200 mg total) by mouth 2 (two) times daily. (Patient taking differently: Take 200 mg by mouth daily.)  . lidocaine (LIDODERM) 5 % Place 1 patch onto the skin daily. Remove & Discard patch within 12 hours or as directed by MD  . mycophenolate (MYFORTIC) 180 MG EC tablet Take 180 mg by mouth 2 (two) times daily.  . Naphazoline HCl (CLEAR EYES OP) Place 1 drop into both eyes daily as needed (dry eyes).  . nitroGLYCERIN (NITROSTAT) 0.4 MG SL tablet Place 0.4 mg under the tongue every 5 (five) minutes as needed for chest pain.   . predniSONE (DELTASONE) 5 MG tablet Take 5 mg by mouth daily.  . rosuvastatin (CRESTOR) 5 MG tablet Take 5 mg by mouth daily at 6 PM.  . sulfamethoxazole-trimethoprim (BACTRIM) 400-80 MG tablet Take 1 tablet by mouth every Monday, Wednesday, and Friday. Take 1 tablet by mouth every Monday, Wednesday, and Friday.  . tacrolimus (PROGRAF) 0.5 MG capsule Take 0.5 mg by mouth 2 (two) times daily. 1.$RemoveBefore'5mg'hLphOrdYWqcPL$  in the am & $Re'1mg'coa$  in pm  . torsemide (DEMADEX) 20 MG tablet Take 40 mg by mouth daily.  . valsartan (DIOVAN) 40 MG tablet Take  0.5 tablets (20 mg total) by mouth daily.   No facility-administered encounter medications on file as of 05/27/2020.    Allergies (verified) Enalapril maleate, Penicillins, Chocolate, and Tape   History: Past Medical History:  Diagnosis Date  . Anxiety    panic attack- talks herself and takes deep breathes  . Depression   . Dyspnea    with exertion   . ESRD (end stage renal disease) ([place])    hemo TTHSAT  . FSGS (focal segmental glomerulosclerosis) 2011   By renal biopsy  .  Head injury    age 35  . History of kidney stones    kidney stone  . Hx of lupus nephritis 2011   by renal biopsy  . Lupus (systemic lupus erythematosus) ([name])    followed by Dr. Amil Amen  . Obesity   . Pericarditis    age 50ish  . Renal stone   . Secondary hyperparathyroidism of renal origin ([place])   . TIA (transient ischemic attack)    no residual effects   Past Surgical History:  Procedure Laterality Date  . AV FISTULA PLACEMENT Left 07/04/2017   Procedure: ARTERIOVENOUS (AV) FISTULA CREATION BRACHIOCEPHALIC;  Surgeon: [name], MD;  Location: [facility] OR;  Service: Vascular;  Laterality: Left;  . COLONOSCOPY W/ POLYPECTOMY    . IR FLUORO GUIDE CV LINE RIGHT  06/28/2017  . IR US GUIDE VASC ACCESS RIGHT  06/28/2017  . KIDNEY SURGERY     kidney transplant 2020  . REVISION OF ARTERIOVENOUS GORETEX GRAFT Left 11/30/2017   Procedure: REVISION OF ARTERIOVENOUS FISTULA LEFT ARM Superfistulization and branch ligation.;  Surgeon: Waynetta Sandy, MD;  Location: [facility] OR;  Service: Vascular;  Laterality: Left;   Family History  Problem Relation Age of Onset  . Diabetes Mother   . Hypertension Father   . Cancer Sister   . Hypertension Brother    Social History   Socioeconomic History  . Marital status: Legally Separated    Spouse name: Not on file  . Number of children: 1  . Years of education: Not on file  . Highest education level: Not on file  Occupational History  . Not on file  Tobacco Use  . Smoking status: Never Smoker  . Smokeless tobacco: Never Used  Vaping Use  . Vaping Use: Never used  Substance and Sexual Activity  . Alcohol use: No  . Drug use: No  . Sexual activity: Not Currently  Other Topics Concern  . Not on file  Social History Narrative   Right Handed   Lives in a two story home   Daughter recently got married    Social Determinants of Health   Financial Resource Strain: Low Risk   . Difficulty of Paying Living Expenses: Not very hard  Food  Insecurity: No Food Insecurity  . Worried About Charity fundraiser in the Last Year: Never true  . Ran Out of Food in the Last Year: Never true  Transportation Needs: No Transportation Needs  . Lack of Transportation (Medical): No  . Lack of Transportation (Non-Medical): No  Physical Activity: Sufficiently Active  . Days of Exercise per Week: 5 days  . Minutes of Exercise per Session: 30 min  Stress: Stress Concern Present  . Feeling of Stress : To some extent  Social Connections: Moderately Isolated  . Frequency of Communication with Friends and Family: More than three times a week  . Frequency of Social Gatherings with Friends and Family: Once a week  .  Attends Religious Services: 1 to 4 times per year  . Active Member of Clubs or Organizations: No  . Attends Archivist Meetings: Never  . Marital Status: Separated    Tobacco Counseling Counseling given: Not Answered   Clinical Intake:  Pre-visit preparation completed: Yes  Pain : 0-10 Pain Score: 5  Pain Type: Chronic pain Pain Location: Chest (left side) Pain Descriptors / Indicators: Sharp,Dull,Aching Pain Onset: More than a month ago Pain Frequency: Intermittent     BMI - recorded: 32.42 Nutritional Status: BMI > 30  Obese Nutritional Risks: None Diabetes: No  How often do you need to have someone help you when you read instructions, pamphlets, or other written materials from your doctor or pharmacy?: 1 - Never  Diabetic?No  Interpreter Needed?: No  Information entered by :: Charlott Rakes, LPN   Activities of Daily Living In your present state of health, do you have any difficulty performing the following activities: 05/27/2020  Hearing? N  Vision? N  Difficulty concentrating or making decisions? Y  Comment remembering Dr appt  Walking or climbing stairs? N  Dressing or bathing? N  Doing errands, shopping? N  Preparing Food and eating ? N  Using the Toilet? N  In the past six months, have  you accidently leaked urine? N  Do you have problems with loss of bowel control? N  Managing your Medications? N  Managing your Finances? N  Housekeeping or managing your Housekeeping? N  Some recent data might be hidden    Patient Care Team: [name], Betty G, MD as PCP - General (Family Medicine) Royann Shivers, MD as Referring Physician (Nephrology) Alda Berthold, DO as Consulting Physician (Neurology)  Indicate any recent Medical Services you may have received from other than [facility] providers in the past year (date may be approximate).     Assessment:   This is a routine wellness examination for Jenna [name].  Hearing/Vision screen  Hearing Screening   '125Hz'$  $[place]'250Hz'VJTFW$'500Hz'$'1000Hz'$'2000Hz'$'3000Hz'$'4000Hz'$'6000Hz'$'8000Hz'$   Right ear:           Left ear:           Comments: Pt denies any hearing issues   Vision Screening Comments: Pt follows up with dr Kathrin Penner for annual eye exams   Dietary issues and exercise activities discussed: Current Exercise Habits: Home exercise routine, Type of exercise: walking, Time (Minutes): 30, Frequency (Times/Week): 5, Weekly Exercise (Minutes/Week): 150  Goals    . Patient Stated     Get stronger       Depression Screen PHQ 2/9 Scores 05/27/2020 01/05/2019 11/07/2017 12/19/2014 09/30/2014 08/15/2014 07/04/2014  PHQ - 2 Score 1 0 0 4 0 2 0  PHQ- 9 Score - - - - - 13 -    Fall Risk Fall Risk  05/27/2020 02/22/2020 01/05/2019 11/07/2017 12/19/2014  Falls in the past year? 0 1 1 Yes No  Number falls in past yr: 0 1 1 - -  Injury with Fall? 0 1 0 Yes -  Comment - - - - -  Risk Factor Category  - - - - High Fall Risk  Risk for fall due to : Impaired vision;Impaired balance/gait;Impaired mobility - - Impaired balance/gait -  Follow up Falls prevention discussed - Education provided - -    FALL RISK PREVENTION PERTAINING TO THE HOME:  Any stairs in or around the home? Yes  If so, are there any without handrails? No  Home free of loose throw rugs  in  walkways, pet beds, electrical cords, etc? Yes  Adequate lighting in your home to reduce risk of falls? Yes   ASSISTIVE DEVICES UTILIZED TO PREVENT FALLS:  Life alert? No  Use of a cane, walker or w/c? Yes  Grab bars in the bathroom? No  Shower chair or bench in shower? No  Elevated toilet seat or a handicapped toilet? No   TIMED UP AND GO:  Was the test performed? No .     Cognitive Function:     6CIT Screen 05/27/2020  What Year? 0 points  What month? 0 points  Count back from 20 0 points  Months in reverse 0 points  Repeat phrase 0 points    Immunizations Immunization History  Administered Date(s) Administered  . Influenza,inj,Quad PF,6+ Mos 11/19/2013, 10/31/2014, 11/07/2017, 11/10/2018, 02/26/2019, 11/27/2019  . PFIZER Comirnaty(Gray Top)Covid-19 Tri-Sucrose Vaccine 05/12/2019, 05/30/2019, 02/06/2020  . Pneumococcal Polysaccharide-23 09/23/2017, 03/02/2019  . Tdap 09/23/2017    TDAP status: Up to date  Flu Vaccine status: Up to date  Pneumococcal vaccine status: Up to date  Covid-19 vaccine status: Completed vaccines  Qualifies for Shingles Vaccine? Yes   Zostavax completed No   Shingrix Completed?: No.    Education has been provided regarding the importance of this vaccine. Patient has been advised to call insurance company to determine out of pocket expense if they have not yet received this vaccine. Advised may also receive vaccine at local pharmacy or Health Dept. Verbalized acceptance and understanding.  Screening Tests Health Maintenance  Topic Date Due  . COVID-19 Vaccine (4 - Booster for Pfizer series) 08/06/2020  . INFLUENZA VACCINE  09/15/2020  . PAP SMEAR-Modifier  11/07/2020  . MAMMOGRAM  02/18/2021  . COLONOSCOPY (Pts 45-31yrs Insurance coverage will need to be confirmed)  07/15/2022  . TETANUS/TDAP  09/24/2027  . Hepatitis C Screening  Completed  . HIV Screening  Completed  . HPV VACCINES  Aged Out    Health Maintenance  There are no  preventive care reminders to display for this patient.  Colorectal cancer screening: Type of screening: Colonoscopy. Completed 08/25/12. Repeat every 10 years  Mammogram status: Completed 02/22/19. Repeat every year  Bone Density status: Completed 12/28/18. Results reflect: Bone density results: NORMAL. Repeat every 3-5 years.   Additional Screening:  Hepatitis C Screening:  Completed 03/22/18  Vision Screening: Recommended annual ophthalmology exams for early detection of glaucoma and other disorders of the eye. Is the patient up to date with their annual eye exam?  Yes  Who is the provider or what is the name of the office in which the patient attends annual eye exams? Dr Kathrin Penner  If pt is not established with a provider, would they like to be referred to a provider to establish care? No .   Dental Screening: Recommended annual dental exams for proper oral hygiene  Community Resource Referral / Chronic Care Management: CRR required this visit?  No   CCM required this visit?  No      Plan:     I have personally reviewed and noted the following in the patient's chart:   . Medical and social history . Use of alcohol, tobacco or illicit drugs  . Current medications and supplements . Functional ability and status . Nutritional status . Physical activity . Advanced directives . List of other physicians . Hospitalizations, surgeries, and ER visits in previous 12 months . Vitals . Screenings to include cognitive, depression, and falls . Referrals and appointments  In addition, I have  reviewed and discussed with patient certain preventive protocols, quality metrics, and best practice recommendations. A written personalized care plan for preventive services as well as general preventive health recommendations were provided to patient.     Willette Brace, LPN   05/09/4980   Nurse Notes: None

## 2020-05-28 ENCOUNTER — Ambulatory Visit (INDEPENDENT_AMBULATORY_CARE_PROVIDER_SITE_OTHER): Payer: Medicare Other | Admitting: Family Medicine

## 2020-05-28 ENCOUNTER — Encounter: Payer: Self-pay | Admitting: Family Medicine

## 2020-05-28 VITALS — BP 126/80 | HR 82 | Resp 16 | Ht 67.0 in | Wt 198.5 lb

## 2020-05-28 DIAGNOSIS — I129 Hypertensive chronic kidney disease with stage 1 through stage 4 chronic kidney disease, or unspecified chronic kidney disease: Secondary | ICD-10-CM | POA: Diagnosis not present

## 2020-05-28 DIAGNOSIS — G63 Polyneuropathy in diseases classified elsewhere: Secondary | ICD-10-CM

## 2020-05-28 DIAGNOSIS — R0602 Shortness of breath: Secondary | ICD-10-CM

## 2020-05-28 DIAGNOSIS — F419 Anxiety disorder, unspecified: Secondary | ICD-10-CM | POA: Diagnosis not present

## 2020-05-28 DIAGNOSIS — I2510 Atherosclerotic heart disease of native coronary artery without angina pectoris: Secondary | ICD-10-CM | POA: Diagnosis not present

## 2020-05-28 DIAGNOSIS — R1084 Generalized abdominal pain: Secondary | ICD-10-CM

## 2020-05-28 NOTE — Progress Notes (Signed)
Chief Complaint  Patient presents with  . discuss pain in feet    Wants referral to neurology as well.    HPI: [name] is a 65 y.o. female, who is here today with above complaint.  She was last seen on 01/15/20. No new problems sine her last visit.  LLE pain and paresthesia:Problem has been going on for 1-2 years. Initially though it was related to renal transplant. She feels like problem is getting worse.  She was evaluated by Jenna [name]. She would like to see a different neurologist. According the patient, EMG was recommended. Currently she is on gabapentin 300 mg daily.  Gabapentin has helped but now having symptoms during the day. Burning sensation was "horrible", not bad now. Left foot numbness and pain. She has not noted erythema or skin ulcers.  She wants to know how she can check for LE edema. She is not sure if she is having it. She is on Torsemide 20 mg bid. HFrEF and CAD. 01/12/20 echo: LVEF 35-40%.  She is on Aspirin and Crestor. Last saw her cardiologist on 05/09/20.  Negative for orthopnea and PND. Hypertension: Valsartan was discontinued. She denies checking BP at home. Episodes of lightheadedness have resolved. She does "feel" good.  Requesting form to be completed, certifying that she has history of TIA and s/p kidney transplant, so student loan can be forgiven.  Anxiety: She is on lexapro 5 mg daily. She has not followed with psychiatrist since 12/2019. Her daughter got married and move to [place], which has aggravated anxiety. She is living alone.  SOB/fatigue is stable. Requesting handicap sticker.  No associated CP,palpitations,or diaphoresis. According to pt, planning on having a stress test and TEE on 06/05/20.  ESRD s/p renal transplant and lupus. She follows with transplant team annually, nephrologist every 3 months. Currently she is on prednisone 5 mg daily, tacrolimus 0.5 mg 1/2 tablet twice daily, and Myfortic 180 mg 2  capsules a.m. and 2 capsules p.m. (recently decreased). She is also on Plaquenil 200 mg twice daily. She takes Bactrim 400-80 mg MWF.  States that yesterday after eating some sausages she started with LUQ abdominal pain and had "little nausea."  She took nitro SL afraid that it may have been her heart. She also had 4 bowel movements "back-to-back" and felt better and after burping.  She did not notice any blood or melena. She is feeling better today, she has some abdominal "soreness." She has not noted fever, chills, change in appetite, vomiting,or urinary symptoms.  Last colonoscopy at least 2 years ago. According to pt, she was supposed to call back after she discontinuing anticoagulation to have it repeated. She has the phone number to call.  Review of Systems  Constitutional: Negative for activity change and appetite change.  HENT: Negative for mouth sores, nosebleeds and sore throat.   Eyes: Negative for redness and visual disturbance.  Respiratory: Negative for cough and wheezing.   Genitourinary: Negative for decreased urine volume, dysuria and hematuria.  Musculoskeletal: Negative for gait problem and joint swelling.  Neurological: Negative for syncope, facial asymmetry and weakness.  Psychiatric/Behavioral: Negative for confusion and hallucinations. The patient is nervous/anxious.   Rest of ROS, see pertinent positives sand negatives in HPI  Current Outpatient Medications on File Prior to Visit  Medication Sig Dispense Refill  . acetaminophen (TYLENOL) 500 MG tablet Take 1,000 mg by mouth every 6 (six) hours as needed for headache.    Marland Kitchen aspirin 81 MG EC tablet Take  by mouth.    . escitalopram (LEXAPRO) 5 MG tablet Take 5 mg by mouth daily.    Marland Kitchen gabapentin (NEURONTIN) 300 MG capsule Take 1 capsule (300 mg total) by mouth daily. 30 capsule 3  . hydroxychloroquine (PLAQUENIL) 200 MG tablet Take 1 tablet (200 mg total) by mouth 2 (two) times daily. (Patient taking differently:  Take 200 mg by mouth daily.) 180 tablet 3  . lidocaine (LIDODERM) 5 % Place 1 patch onto the skin daily. Remove & Discard patch within 12 hours or as directed by MD 30 patch 6  . mycophenolate (MYFORTIC) 180 MG EC tablet Take 180 mg by mouth 2 (two) times daily.    . Naphazoline HCl (CLEAR EYES OP) Place 1 drop into both eyes daily as needed (dry eyes).    . nitroGLYCERIN (NITROSTAT) 0.4 MG SL tablet Place 0.4 mg under the tongue every 5 (five) minutes as needed for chest pain.     . predniSONE (DELTASONE) 5 MG tablet Take 5 mg by mouth daily.    . rosuvastatin (CRESTOR) 5 MG tablet Take 5 mg by mouth daily at 6 PM.    . sulfamethoxazole-trimethoprim (BACTRIM) 400-80 MG tablet Take 1 tablet by mouth every Monday, Wednesday, and Friday. Take 1 tablet by mouth every Monday, Wednesday, and Friday.    . tacrolimus (PROGRAF) 0.5 MG capsule Take 0.5 mg by mouth 2 (two) times daily. 1.$RemoveBefore'5mg'mfIHwxaeRTqsk$  in the am & $Re'1mg'XMm$  in pm    . torsemide (DEMADEX) 20 MG tablet Take 40 mg by mouth daily.     No current facility-administered medications on file prior to visit.   Past Medical History:  Diagnosis Date  . Anxiety    panic attack- talks herself and takes deep breathes  . Depression   . Dyspnea    with exertion   . ESRD (end stage renal disease) ([place])    hemo TTHSAT  . FSGS (focal segmental glomerulosclerosis) 2011   By renal biopsy  . Head injury    age 71  . History of kidney stones    kidney stone  . Hx of lupus nephritis 2011   by renal biopsy  . Lupus (systemic lupus erythematosus) ([name])    followed by Jenna. Amil Amen  . Obesity   . Pericarditis    age 14ish  . Renal stone   . Secondary hyperparathyroidism of renal origin ([place])   . TIA (transient ischemic attack)    no residual effects   Allergies  Allergen Reactions  . Enalapril Maleate Anaphylaxis and Other (See Comments)    Throat swells  . Penicillins Other (See Comments)    Made patient lightheaded PATIENT HAS HAD A PCN REACTION WITH  IMMEDIATE RASH, FACIAL/TONGUE/THROAT SWELLING, SOB, OR LIGHTHEADEDNESS WITH HYPOTENSION:  #  #  YES  #  #  Has patient had a PCN reaction causing severe rash involving mucus membranes or skin necrosis: No Has patient had a PCN reaction that required hospitalization: No Has patient had a PCN reaction occurring within the last 10 years: No If all of the above answers are "NO", then may proceed with Cephalosporin use.   . Chocolate Nausea And Vomiting  . Tape Itching, Rash and Other (See Comments)    Social History   Socioeconomic History  . Marital status: Legally Separated    Spouse name: Not on file  . Number of children: 1  . Years of education: Not on file  . Highest education level: Not on file  Occupational History  .  Not on file  Tobacco Use  . Smoking status: Never Smoker  . Smokeless tobacco: Never Used  Vaping Use  . Vaping Use: Never used  Substance and Sexual Activity  . Alcohol use: No  . Drug use: No  . Sexual activity: Not Currently  Other Topics Concern  . Not on file  Social History Narrative   Right Handed   Lives in a two story home   Daughter recently got married    Social Determinants of Health   Financial Resource Strain: Low Risk   . Difficulty of Paying Living Expenses: Not very hard  Food Insecurity: No Food Insecurity  . Worried About Charity fundraiser in the Last Year: Never true  . Ran Out of Food in the Last Year: Never true  Transportation Needs: No Transportation Needs  . Lack of Transportation (Medical): No  . Lack of Transportation (Non-Medical): No  Physical Activity: Sufficiently Active  . Days of Exercise per Week: 5 days  . Minutes of Exercise per Session: 30 min  Stress: Stress Concern Present  . Feeling of Stress : To some extent  Social Connections: Moderately Isolated  . Frequency of Communication with Friends and Family: More than three times a week  . Frequency of Social Gatherings with Friends and Family: Once a week  .  Attends Religious Services: 1 to 4 times per year  . Active Member of Clubs or Organizations: No  . Attends Archivist Meetings: Never  . Marital Status: Separated    Vitals:   05/28/20 1559  BP: 126/80  Pulse: 82  Resp: 16  SpO2: 98%   Body mass index is 31.09 kg/m.   Physical Exam Vitals and nursing note reviewed.  Constitutional:      General: She is not in acute distress.    Appearance: She is well-developed.  HENT:     Head: Normocephalic and atraumatic.     Mouth/Throat:     Mouth: Mucous membranes are moist.     Pharynx: Oropharynx is clear.  Eyes:     Conjunctiva/sclera: Conjunctivae normal.  Cardiovascular:     Rate and Rhythm: Normal rate and regular rhythm.     Pulses:          Dorsalis pedis pulses are 2+ on the right side and 2+ on the left side.     Heart sounds: No murmur heard.   Pulmonary:     Effort: Pulmonary effort is normal. No respiratory distress.     Breath sounds: Normal breath sounds.  Abdominal:     Palpations: Abdomen is soft. There is no hepatomegaly or mass.     Tenderness: There is generalized abdominal tenderness (mild). There is no guarding or rebound.    Lymphadenopathy:     Cervical: No cervical adenopathy.  Skin:    General: Skin is warm.     Findings: No erythema or rash.  Neurological:     Mental Status: She is alert and oriented to person, place, and time.     Cranial Nerves: No cranial nerve deficit.     Comments: Stable gait without assistance. Mildly decreased monofilament lateral aspect of left calf and forefoot.  Psychiatric:     Comments: Well groomed, good eye contact.   ASSESSMENT AND PLAN:  Jenna [name] was seen today for follow-up.  Diagnoses and all orders for this visit:  Abdominal pain, generalized Symptoms have improved. I do not think imaging or ER evaluation are needed at this time.  Bland foods and small portions at the time recommended. She was clearly instructed about warning  signs. I could not find report of last colonoscopy. She will call her GI to arrange colonoscopy.  SOB (shortness of breath) This seems to be a chronic problem. Some of her chronic medical conditions could be contributing factors. ?  Deconditioning. Continue to follow with cardiologist.  Handicap sticker and disability school form completed.  Polyneuropathy associated with underlying disease ([place]) She decided to hold on establishing with a different neurologist. Recommend increasing dose of gabapentin from 300 mg daily to 300 mg twice daily. We discussed some side effects of medications. Fall precautions discussed. Appropriate skin/foot care discussed.  Hypertension with renal disease BP adequately controlled. Continue nonpharmacologic treatment. Recommend monitoring BP regularly. Low-salt diet.  CAD (coronary artery disease) Continue Aspirin 81 mg daily and rosuvastatin 5 mg daily. Clearly instructed about warning signs. Appointment with cardiologist next week, which is planning on stress test and TEE.  Anxiety disorder Exacerbated by her daughter moving to a different town. Continue Lexapro 5 mg daily. Instructed to arrange appointment with his psychiatrist, phone number given.  Spent 48 minutes.  During this time history was obtained and documented, examination was performed, prior labs reviewed, and assessment/plan discussed.   Return in about 4 months (around 09/27/2020).   Jereme Loren G. [name], MD  [facility]. [place] office.   A few things to remember from today's visit:  If you need refills please call your pharmacy. Do not use My Chart to request refills or for acute issues that need immediate attention.   Increase Gabapentin from 300 mg at bedtime to 1 in the morning and night. Call your psychiatrist's office to arrange follow up appt. Monitor for new stomach symptoms. Fall precautions.    Please be sure medication list is accurate. If a new  problem present, please set up appointment sooner than planned today.

## 2020-05-28 NOTE — Patient Instructions (Addendum)
A few things to remember from today's visit:  If you need refills please call your pharmacy. Do not use My Chart to request refills or for acute issues that need immediate attention.   Increase Gabapentin from 300 mg at bedtime to 1 in the morning and night. Call your psychiatrist's office to arrange follow up appt. Monitor for new stomach symptoms. Fall precautions.    Please be sure medication list is accurate. If a new problem present, please set up appointment sooner than planned today.

## 2020-05-28 NOTE — Assessment & Plan Note (Signed)
Exacerbated by her daughter moving to a different town. Continue Lexapro 5 mg daily. Instructed to arrange appointment with his psychiatrist, phone number given.

## 2020-05-28 NOTE — Assessment & Plan Note (Signed)
Continue Aspirin 81 mg daily and rosuvastatin 5 mg daily. Clearly instructed about warning signs. Appointment with cardiologist next week, which is planning on stress test and TEE.

## 2020-05-28 NOTE — Assessment & Plan Note (Signed)
BP adequately controlled. Continue nonpharmacologic treatment. Recommend monitoring BP regularly. Low-salt diet.

## 2020-06-06 ENCOUNTER — Telehealth: Payer: Self-pay | Admitting: Family Medicine

## 2020-06-06 NOTE — Telephone Encounter (Signed)
I called and spoke with Jenna [name], PCP will follow orders.

## 2020-06-06 NOTE — Telephone Encounter (Signed)
Jenna [name] from [facility] called to let Dr. [name] know that the patient is being discharged tomorrow and they want to know if Dr. [name] will follow these orders.  [name] [phone]

## 2020-06-13 ENCOUNTER — Inpatient Hospital Stay: Payer: Medicare Other | Admitting: Family Medicine

## 2020-06-13 NOTE — Progress Notes (Deleted)
HPI:   [name] is a 65 y.o. female, who is here today to follow on recent OV/ER.   She was admitted on 05/31/2020 because of persistent nausea, vomiting, diarrhea. 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 *** S/P renal transplant on immunosuppressive therapy. Transplant team was consulted. During hospitalization she developed acute hypoxic respiratory failure and required supplemental O2, 2-3 L.  Jenna [name], this raised a concern for CMV induced pneumonitis. Upon discharge diarrhea improved as well as her nausea and vomiting.  chest x-ray showed increasing interstitial markings concerning for edema as well as known cardiomegaly and TTE (06/04/20) showed LVEF 35 to 40% with regional wall abnormalities. CAD s/p***. At the time of her discharge torsemide was resumed at 20 mg twice daily.      Review of Systems Rest see pertinent positives and negatives per HPI.   Current Outpatient Medications on File Prior to Visit  Medication Sig Dispense Refill  . acetaminophen (TYLENOL) 500 MG tablet Take 1,000 mg by mouth every 6 (six) hours as needed for headache.    Marland Kitchen aspirin 81 MG EC tablet Take by mouth.    . escitalopram (LEXAPRO) 5 MG tablet Take 5 mg by mouth daily.    Marland Kitchen gabapentin (NEURONTIN) 300 MG capsule Take 1 capsule (300 mg total) by mouth daily. 30 capsule 3  . hydroxychloroquine (PLAQUENIL) 200 MG tablet Take 1 tablet (200 mg total) by mouth 2 (two) times daily. (Patient taking differently: Take 200 mg by mouth daily.) 180 tablet 3  . lidocaine (LIDODERM) 5 % Place 1 patch onto the skin daily. Remove & Discard patch within 12 hours or as directed by MD 30 patch 6  . mycophenolate (MYFORTIC) 180 MG EC tablet Take 180 mg by mouth 2 (two) times daily.    . Naphazoline HCl (CLEAR EYES OP) Place 1 drop into both eyes daily as needed (dry eyes).    . nitroGLYCERIN (NITROSTAT) 0.4 MG SL tablet Place 0.4 mg under  the tongue every 5 (five) minutes as needed for chest pain.     . predniSONE (DELTASONE) 5 MG tablet Take 5 mg by mouth daily.    . rosuvastatin (CRESTOR) 5 MG tablet Take 5 mg by mouth daily at 6 PM.    . sulfamethoxazole-trimethoprim (BACTRIM) 400-80 MG tablet Take 1 tablet by mouth every Monday, Wednesday, and Friday. Take 1 tablet by mouth every Monday, Wednesday, and Friday.    . tacrolimus (PROGRAF) 0.5 MG capsule Take 0.5 mg by mouth 2 (two) times daily. 1.$RemoveBefore'5mg'[id number]$  in the am & $Re'1mg'nzs$  in pm    . torsemide (DEMADEX) 20 MG tablet Take 40 mg by mouth daily.     No current facility-administered medications on file prior to visit.     Past Medical History:  Diagnosis Date  . Anxiety    panic attack- talks herself and takes deep breathes  . Depression   . Dyspnea    with exertion   . ESRD (end stage renal disease) (Jenna [name])    hemo TTHSAT  . FSGS (focal segmental glomerulosclerosis) 2011   By renal biopsy  . Head injury    age 66  . History of kidney stones    kidney stone  . Hx of lupus nephritis 2011   by renal biopsy  . Lupus (systemic lupus erythematosus) (Jenna [name])    followed by Dr. Amil Amen  . Obesity   . Pericarditis  age 36ish  . Renal stone   . Secondary hyperparathyroidism of renal origin (Jenna [name])   . TIA (transient ischemic attack)    no residual effects   Allergies  Allergen Reactions  . Enalapril Maleate Anaphylaxis and Other (See Comments)    Throat swells  . Penicillins Other (See Comments)    Made patient lightheaded PATIENT HAS HAD A PCN REACTION WITH IMMEDIATE RASH, FACIAL/TONGUE/THROAT SWELLING, SOB, OR LIGHTHEADEDNESS WITH HYPOTENSION:  #  #  YES  #  #  Has patient had a PCN reaction causing severe rash involving mucus membranes or skin necrosis: No Has patient had a PCN reaction that required hospitalization: No Has patient had a PCN reaction occurring within the last 10 years: No If all of the above answers are "NO", then may proceed with Cephalosporin use.   .  Chocolate Nausea And Vomiting  . Tape Itching, Rash and Other (See Comments)    Social History   Socioeconomic History  . Marital status: Legally Separated    Spouse name: Not on file  . Number of children: 1  . Years of education: Not on file  . Highest education level: Not on file  Occupational History  . Not on file  Tobacco Use  . Smoking status: Never Smoker  . Smokeless tobacco: Never Used  Vaping Use  . Vaping Use: Never used  Substance and Sexual Activity  . Alcohol use: No  . Drug use: No  . Sexual activity: Not Currently  Other Topics Concern  . Not on file  Social History Narrative   Right Handed   Lives in a two story home   Daughter recently got married    Social Determinants of Health   Financial Resource Strain: Low Risk   . Difficulty of Paying Living Expenses: Not very hard  Food Insecurity: No Food Insecurity  . Worried About Charity fundraiser in the Last Year: Never true  . Ran Out of Food in the Last Year: Never true  Transportation Needs: No Transportation Needs  . Lack of Transportation (Medical): No  . Lack of Transportation (Non-Medical): No  Physical Activity: Sufficiently Active  . Days of Exercise per Week: 5 days  . Minutes of Exercise per Session: 30 min  Stress: Stress Concern Present  . Feeling of Stress : To some extent  Social Connections: Moderately Isolated  . Frequency of Communication with Friends and Family: More than three times a week  . Frequency of Social Gatherings with Friends and Family: Once a week  . Attends Religious Services: 1 to 4 times per year  . Active Member of Clubs or Organizations: No  . Attends Archivist Meetings: Never  . Marital Status: Separated    There were no vitals filed for this visit. There is no height or weight on file to calculate BMI.      Physical Exam  ASSESSMENT AND PLAN:  There are no diagnoses linked to this encounter.   No orders of the defined types were  placed in this encounter.   There are no diagnoses linked to this encounter.  No problem-specific Assessment & Plan notes found for this encounter.      Jenna Halm G. [name], MD  [facility]. [place] office.

## 2020-07-02 ENCOUNTER — Telehealth: Payer: Self-pay | Admitting: Family Medicine

## 2020-07-02 NOTE — Telephone Encounter (Signed)
In your bin to be signed, I already filled everything out.

## 2020-07-02 NOTE — Telephone Encounter (Signed)
Pt is calling in needing a Personal Care Services Request form ([facility] that will pay the [phone] [id number]) filled out.  Pt is needing this done as soon as possible pt just got out of the hospital from being in there for over a month from having a massive heartache and is not able to do any house work due to her being SOB.  Pt is aware that her provider is out of the office and will be returning next week.

## 2020-07-03 NOTE — Telephone Encounter (Signed)
[facility] is calling to let us know the name of the form and [place] for Medical Needs Form #[id number]/website info: [email or web address].  Pt is also wanting to have a hospital follow-up to see if she can be seen tomorrow 07/04/2020 due to her feet being swollen and not able to put on any shoes.  Pt declined to go to UC or ER stated that they told her that they are not able to assist her.  Pt would like to have a call back to let her know if she is able to be seen tomorrow by Dr. [name] and she is aware that provider does not have anything but asked me to send a msg back.

## 2020-07-04 NOTE — Telephone Encounter (Signed)
Patient is calling back and is requesting a call back regarding forms, please advise. CB is [phone]

## 2020-07-04 NOTE — Telephone Encounter (Signed)
Form has been completed and signed. Thanks, BJ

## 2020-07-04 NOTE — Telephone Encounter (Signed)
Forms have been faxed to [phone]. Form have also been mailed to the patient per request.

## 2020-07-09 ENCOUNTER — Inpatient Hospital Stay: Payer: Medicare Other | Admitting: Family Medicine

## 2020-07-11 ENCOUNTER — Telehealth: Payer: Self-pay | Admitting: Family Medicine

## 2020-07-11 ENCOUNTER — Inpatient Hospital Stay (HOSPITAL_COMMUNITY)
Admission: EM | Admit: 2020-07-11 | Discharge: 2020-07-12 | DRG: 871 | Disposition: A | Discharge status: Other Acute Inpatient Hospital | Payer: Medicare Other | Attending: Family Medicine | Admitting: Family Medicine

## 2020-07-11 ENCOUNTER — Encounter (HOSPITAL_COMMUNITY): Payer: Self-pay

## 2020-07-11 ENCOUNTER — Emergency Department (HOSPITAL_COMMUNITY): Payer: Medicare Other

## 2020-07-11 DIAGNOSIS — D638 Anemia in other chronic diseases classified elsewhere: Secondary | ICD-10-CM | POA: Diagnosis not present

## 2020-07-11 DIAGNOSIS — I480 Paroxysmal atrial fibrillation: Secondary | ICD-10-CM | POA: Diagnosis present

## 2020-07-11 DIAGNOSIS — F419 Anxiety disorder, unspecified: Secondary | ICD-10-CM | POA: Diagnosis present

## 2020-07-11 DIAGNOSIS — E669 Obesity, unspecified: Secondary | ICD-10-CM | POA: Diagnosis present

## 2020-07-11 DIAGNOSIS — Z20822 Contact with and (suspected) exposure to covid-19: Secondary | ICD-10-CM | POA: Diagnosis present

## 2020-07-11 DIAGNOSIS — E871 Hypo-osmolality and hyponatremia: Secondary | ICD-10-CM | POA: Diagnosis present

## 2020-07-11 DIAGNOSIS — Z888 Allergy status to other drugs, medicaments and biological substances status: Secondary | ICD-10-CM

## 2020-07-11 DIAGNOSIS — Z8673 Personal history of transient ischemic attack (TIA), and cerebral infarction without residual deficits: Secondary | ICD-10-CM

## 2020-07-11 DIAGNOSIS — Z6833 Body mass index (BMI) 33.0-33.9, adult: Secondary | ICD-10-CM

## 2020-07-11 DIAGNOSIS — N179 Acute kidney failure, unspecified: Secondary | ICD-10-CM | POA: Diagnosis present

## 2020-07-11 DIAGNOSIS — N269 Renal sclerosis, unspecified: Secondary | ICD-10-CM | POA: Diagnosis present

## 2020-07-11 DIAGNOSIS — J9601 Acute respiratory failure with hypoxia: Secondary | ICD-10-CM | POA: Diagnosis present

## 2020-07-11 DIAGNOSIS — F32A Depression, unspecified: Secondary | ICD-10-CM | POA: Diagnosis present

## 2020-07-11 DIAGNOSIS — R197 Diarrhea, unspecified: Secondary | ICD-10-CM | POA: Diagnosis present

## 2020-07-11 DIAGNOSIS — D631 Anemia in chronic kidney disease: Secondary | ICD-10-CM | POA: Diagnosis present

## 2020-07-11 DIAGNOSIS — Z79899 Other long term (current) drug therapy: Secondary | ICD-10-CM

## 2020-07-11 DIAGNOSIS — Z7952 Long term (current) use of systemic steroids: Secondary | ICD-10-CM

## 2020-07-11 DIAGNOSIS — Y95 Nosocomial condition: Secondary | ICD-10-CM | POA: Diagnosis present

## 2020-07-11 DIAGNOSIS — R1310 Dysphagia, unspecified: Secondary | ICD-10-CM | POA: Diagnosis present

## 2020-07-11 DIAGNOSIS — D849 Immunodeficiency, unspecified: Secondary | ICD-10-CM | POA: Diagnosis present

## 2020-07-11 DIAGNOSIS — I2510 Atherosclerotic heart disease of native coronary artery without angina pectoris: Secondary | ICD-10-CM | POA: Diagnosis present

## 2020-07-11 DIAGNOSIS — J690 Pneumonitis due to inhalation of food and vomit: Secondary | ICD-10-CM | POA: Diagnosis present

## 2020-07-11 DIAGNOSIS — Z9109 Other allergy status, other than to drugs and biological substances: Secondary | ICD-10-CM

## 2020-07-11 DIAGNOSIS — F410 Panic disorder [episodic paroxysmal anxiety] without agoraphobia: Secondary | ICD-10-CM | POA: Diagnosis present

## 2020-07-11 DIAGNOSIS — I130 Hypertensive heart and chronic kidney disease with heart failure and stage 1 through stage 4 chronic kidney disease, or unspecified chronic kidney disease: Secondary | ICD-10-CM | POA: Diagnosis present

## 2020-07-11 DIAGNOSIS — J189 Pneumonia, unspecified organism: Secondary | ICD-10-CM

## 2020-07-11 DIAGNOSIS — I5023 Acute on chronic systolic (congestive) heart failure: Secondary | ICD-10-CM | POA: Diagnosis present

## 2020-07-11 DIAGNOSIS — Z635 Disruption of family by separation and divorce: Secondary | ICD-10-CM

## 2020-07-11 DIAGNOSIS — M329 Systemic lupus erythematosus, unspecified: Secondary | ICD-10-CM | POA: Diagnosis present

## 2020-07-11 DIAGNOSIS — Z7982 Long term (current) use of aspirin: Secondary | ICD-10-CM

## 2020-07-11 DIAGNOSIS — Z955 Presence of coronary angioplasty implant and graft: Secondary | ICD-10-CM

## 2020-07-11 DIAGNOSIS — Z833 Family history of diabetes mellitus: Secondary | ICD-10-CM

## 2020-07-11 DIAGNOSIS — I5043 Acute on chronic combined systolic (congestive) and diastolic (congestive) heart failure: Secondary | ICD-10-CM | POA: Diagnosis present

## 2020-07-11 DIAGNOSIS — R778 Other specified abnormalities of plasma proteins: Secondary | ICD-10-CM

## 2020-07-11 DIAGNOSIS — M3214 Glomerular disease in systemic lupus erythematosus: Secondary | ICD-10-CM | POA: Diagnosis present

## 2020-07-11 DIAGNOSIS — Z940 Kidney transplant status: Secondary | ICD-10-CM | POA: Diagnosis not present

## 2020-07-11 DIAGNOSIS — J810 Acute pulmonary edema: Secondary | ICD-10-CM

## 2020-07-11 DIAGNOSIS — A419 Sepsis, unspecified organism: Principal | ICD-10-CM | POA: Diagnosis present

## 2020-07-11 DIAGNOSIS — Z880 Allergy status to penicillin: Secondary | ICD-10-CM

## 2020-07-11 DIAGNOSIS — R739 Hyperglycemia, unspecified: Secondary | ICD-10-CM | POA: Diagnosis present

## 2020-07-11 DIAGNOSIS — N2581 Secondary hyperparathyroidism of renal origin: Secondary | ICD-10-CM | POA: Diagnosis present

## 2020-07-11 DIAGNOSIS — B259 Cytomegaloviral disease, unspecified: Secondary | ICD-10-CM | POA: Diagnosis present

## 2020-07-11 DIAGNOSIS — N189 Chronic kidney disease, unspecified: Secondary | ICD-10-CM | POA: Diagnosis present

## 2020-07-11 DIAGNOSIS — I252 Old myocardial infarction: Secondary | ICD-10-CM | POA: Diagnosis not present

## 2020-07-11 DIAGNOSIS — E8770 Fluid overload, unspecified: Secondary | ICD-10-CM | POA: Diagnosis present

## 2020-07-11 DIAGNOSIS — D649 Anemia, unspecified: Secondary | ICD-10-CM

## 2020-07-11 DIAGNOSIS — R7989 Other specified abnormal findings of blood chemistry: Secondary | ICD-10-CM | POA: Diagnosis present

## 2020-07-11 DIAGNOSIS — N184 Chronic kidney disease, stage 4 (severe): Secondary | ICD-10-CM | POA: Diagnosis present

## 2020-07-11 DIAGNOSIS — I255 Ischemic cardiomyopathy: Secondary | ICD-10-CM | POA: Diagnosis present

## 2020-07-11 DIAGNOSIS — R5381 Other malaise: Secondary | ICD-10-CM | POA: Diagnosis present

## 2020-07-11 DIAGNOSIS — Z8249 Family history of ischemic heart disease and other diseases of the circulatory system: Secondary | ICD-10-CM

## 2020-07-11 DIAGNOSIS — R7303 Prediabetes: Secondary | ICD-10-CM | POA: Diagnosis present

## 2020-07-11 DIAGNOSIS — D6862 Lupus anticoagulant syndrome: Secondary | ICD-10-CM | POA: Diagnosis not present

## 2020-07-11 HISTORY — DX: Paroxysmal atrial fibrillation: I48.0

## 2020-07-11 HISTORY — DX: Kidney transplant status: Z94.0

## 2020-07-11 HISTORY — DX: Atherosclerotic heart disease of native coronary artery without angina pectoris: I25.10

## 2020-07-11 HISTORY — DX: Ischemic cardiomyopathy: I25.5

## 2020-07-11 HISTORY — DX: Essential (primary) hypertension: I10

## 2020-07-11 HISTORY — DX: Unspecified systolic (congestive) heart failure: I50.20

## 2020-07-11 HISTORY — DX: Orthostatic hypotension: I95.1

## 2020-07-11 HISTORY — DX: Anemia, unspecified: D64.9

## 2020-07-11 HISTORY — DX: Other cerebral infarction: I63.89

## 2020-07-11 HISTORY — DX: Systemic lupus erythematosus, unspecified: M32.9

## 2020-07-11 LAB — CBC
HCT: 26.6 % — ABNORMAL LOW (ref 36.0–46.0)
Hemoglobin: 8 g/dL — ABNORMAL LOW (ref 12.0–15.0)
MCH: 29.1 pg (ref 26.0–34.0)
MCHC: 30.1 g/dL (ref 30.0–36.0)
MCV: 96.7 fL (ref 80.0–100.0)
Platelets: 134 10*3/uL — ABNORMAL LOW (ref 150–400)
RBC: 2.75 MIL/uL — ABNORMAL LOW (ref 3.87–5.11)
RDW: 21.9 % — ABNORMAL HIGH (ref 11.5–15.5)
WBC: 6.3 10*3/uL (ref 4.0–10.5)
nRBC: 0 % (ref 0.0–0.2)

## 2020-07-11 LAB — GLUCOSE, CAPILLARY: Glucose-Capillary: 82 mg/dL (ref 70–99)

## 2020-07-11 LAB — CREATININE, SERUM
Creatinine, Ser: 2.67 mg/dL — ABNORMAL HIGH (ref 0.44–1.00)
GFR, Estimated: 19 mL/min — ABNORMAL LOW (ref >60)

## 2020-07-11 LAB — CBC WITH DIFFERENTIAL/PLATELET
Abs Immature Granulocytes: 0.07 10*3/uL (ref 0.00–0.07)
Basophils Absolute: 0 10*3/uL (ref 0.0–0.1)
Basophils Relative: 0 %
Eosinophils Absolute: 0.1 10*3/uL (ref 0.0–0.5)
Eosinophils Relative: 1 %
HCT: 28.6 % — ABNORMAL LOW (ref 36.0–46.0)
Hemoglobin: 8.7 g/dL — ABNORMAL LOW (ref 12.0–15.0)
Immature Granulocytes: 1 %
Lymphocytes Relative: 14 %
Lymphs Abs: 1 10*3/uL (ref 0.7–4.0)
MCH: 29.3 pg (ref 26.0–34.0)
MCHC: 30.4 g/dL (ref 30.0–36.0)
MCV: 96.3 fL (ref 80.0–100.0)
Monocytes Absolute: 0.7 10*3/uL (ref 0.1–1.0)
Monocytes Relative: 10 %
Neutro Abs: 5.5 10*3/uL (ref 1.7–7.7)
Neutrophils Relative %: 74 %
Platelets: 172 10*3/uL (ref 150–400)
RBC: 2.97 MIL/uL — ABNORMAL LOW (ref 3.87–5.11)
RDW: 21.8 % — ABNORMAL HIGH (ref 11.5–15.5)
WBC: 7.3 10*3/uL (ref 4.0–10.5)
nRBC: 0.3 % — ABNORMAL HIGH (ref 0.0–0.2)

## 2020-07-11 LAB — BASIC METABOLIC PANEL
Anion gap: 10 (ref 5–15)
BUN: 41 mg/dL — ABNORMAL HIGH (ref 8–23)
CO2: 21 mmol/L — ABNORMAL LOW (ref 22–32)
Calcium: 8.4 mg/dL — ABNORMAL LOW (ref 8.9–10.3)
Chloride: 103 mmol/L (ref 98–111)
Creatinine, Ser: 3.27 mg/dL — ABNORMAL HIGH (ref 0.44–1.00)
GFR, Estimated: 15 mL/min — ABNORMAL LOW (ref >60)
Glucose, Bld: 219 mg/dL — ABNORMAL HIGH (ref 70–99)
Potassium: 4 mmol/L (ref 3.5–5.1)
Sodium: 134 mmol/L — ABNORMAL LOW (ref 135–145)

## 2020-07-11 LAB — HIV ANTIBODY (ROUTINE TESTING W REFLEX): HIV Screen 4th Generation wRfx: NONREACTIVE

## 2020-07-11 LAB — TROPONIN I (HIGH SENSITIVITY)
Troponin I (High Sensitivity): 134 ng/L (ref <18)
Troponin I (High Sensitivity): 373 ng/L (ref <18)
Troponin I (High Sensitivity): 496 ng/L (ref <18)
Troponin I (High Sensitivity): 492 ng/L (ref <18)

## 2020-07-11 LAB — RESP PANEL BY RT-PCR (FLU A&B, COVID) ARPGX2
Influenza A by PCR: NEGATIVE
Influenza B by PCR: NEGATIVE
SARS Coronavirus 2 by RT PCR: NEGATIVE

## 2020-07-11 LAB — BRAIN NATRIURETIC PEPTIDE: B Natriuretic Peptide: >4500 pg/mL — ABNORMAL HIGH (ref 0.0–100.0)

## 2020-07-11 MED ORDER — HYDROXYCHLOROQUINE SULFATE 200 MG PO TABS
200.0000 mg | ORAL_TABLET | Freq: Every day | ORAL | Status: DC
  Administered 2020-07-12: 200 mg via ORAL
  Filled 2020-07-11 (×2): qty 1

## 2020-07-11 MED ORDER — TACROLIMUS 1 MG PO CAPS
1.0000 mg | ORAL_CAPSULE | ORAL | Status: DC
  Administered 2020-07-12: 1 mg via ORAL
  Filled 2020-07-11: qty 1

## 2020-07-11 MED ORDER — ESCITALOPRAM OXALATE 10 MG PO TABS: 5.0000 mg | ORAL_TABLET | Freq: Every day | ORAL | Status: DC

## 2020-07-11 MED ORDER — ROSUVASTATIN CALCIUM 5 MG PO TABS
5.0000 mg | ORAL_TABLET | Freq: Every day | ORAL | Status: DC
  Administered 2020-07-11 – 2020-07-12 (×2): 5 mg via ORAL
  Filled 2020-07-11 (×2): qty 1

## 2020-07-11 MED ORDER — ACETAMINOPHEN 325 MG PO TABS
650.0000 mg | ORAL_TABLET | ORAL | Status: DC | PRN
  Administered 2020-07-11 – 2020-07-12 (×4): 650 mg via ORAL
  Filled 2020-07-11 (×4): qty 2

## 2020-07-11 MED ORDER — INSULIN ASPART 100 UNIT/ML IJ SOLN: 0.0000 [IU] | Freq: Three times a day (TID) | INTRAMUSCULAR | Status: DC

## 2020-07-11 MED ORDER — VALGANCICLOVIR HCL 450 MG PO TABS: 450.0000 mg | ORAL_TABLET | ORAL | Status: DC

## 2020-07-11 MED ORDER — FUROSEMIDE 10 MG/ML IJ SOLN: 40.0000 mg | Freq: Two times a day (BID) | INTRAMUSCULAR | Status: DC

## 2020-07-11 MED ORDER — SODIUM CHLORIDE 0.9% FLUSH: 3.0000 mL | INTRAVENOUS | Status: DC | PRN

## 2020-07-11 MED ORDER — NITROGLYCERIN 2 % TD OINT
1.0000 [in_us] | TOPICAL_OINTMENT | Freq: Once | TRANSDERMAL | Status: AC
  Administered 2020-07-11: 1 [in_us] via TOPICAL
  Filled 2020-07-11: qty 1

## 2020-07-11 MED ORDER — FUROSEMIDE 10 MG/ML IJ SOLN
80.0000 mg | Freq: Once | INTRAMUSCULAR | Status: AC
  Administered 2020-07-11: 80 mg via INTRAVENOUS
  Filled 2020-07-11: qty 8

## 2020-07-11 MED ORDER — ONDANSETRON HCL 4 MG/2ML IJ SOLN: 4.0000 mg | Freq: Four times a day (QID) | INTRAMUSCULAR | Status: DC | PRN

## 2020-07-11 MED ORDER — PREDNISONE 5 MG PO TABS
5.0000 mg | ORAL_TABLET | Freq: Every day | ORAL | Status: DC
  Administered 2020-07-12: 5 mg via ORAL
  Filled 2020-07-11: qty 1

## 2020-07-11 MED ORDER — VALGANCICLOVIR HCL 450 MG PO TABS
450.0000 mg | ORAL_TABLET | ORAL | Status: DC
  Administered 2020-07-12: 450 mg via ORAL
  Filled 2020-07-11: qty 1

## 2020-07-11 MED ORDER — FUROSEMIDE 10 MG/ML IJ SOLN
40.0000 mg | Freq: Once | INTRAMUSCULAR | Status: AC
  Administered 2020-07-11: 40 mg via INTRAVENOUS
  Filled 2020-07-11: qty 4

## 2020-07-11 MED ORDER — TACROLIMUS 0.5 MG PO CAPS
0.5000 mg | ORAL_CAPSULE | ORAL | Status: DC
  Administered 2020-07-11 – 2020-07-12 (×2): 0.5 mg via ORAL
  Filled 2020-07-11 (×2): qty 1

## 2020-07-11 MED ORDER — HEPARIN SODIUM (PORCINE) 5000 UNIT/ML IJ SOLN
5000.0000 [IU] | Freq: Three times a day (TID) | INTRAMUSCULAR | Status: DC
  Filled 2020-07-11 (×3): qty 1

## 2020-07-11 MED ORDER — SODIUM CHLORIDE 0.9% FLUSH
3.0000 mL | Freq: Two times a day (BID) | INTRAVENOUS | Status: DC
  Administered 2020-07-11 – 2020-07-12 (×2): 3 mL via INTRAVENOUS

## 2020-07-11 MED ORDER — SODIUM CHLORIDE 0.9 % IV SOLN: 250.0000 mL | INTRAVENOUS | Status: DC | PRN

## 2020-07-11 NOTE — ED Provider Notes (Addendum)
[facility] EMERGENCY DEPARTMENT Provider Note   CSN: [id number] Arrival date & time: 07/11/20  0351     History Chief Complaint  Patient presents with  . Shortness of Breath    Jenna [name] is a 65 y.o. female.  The history is provided by the EMS personnel and the patient.  Shortness of Breath She has history of hypertension, lupus, heart failure with reduced ejection fraction, chronic kidney disease status post renal transplant and comes in because of shortness of breath.  She had some mild shortness of breath during the day, but she got severely short of breath tonight.  She denies chest pain, heaviness, tightness, pressure.  There has been a slight cough productive of mainly clear sputum.  She denies fever, chills, sweats.  She denies nausea or vomiting.  She has noted some increased pedal edema.  EMS noted oxygen saturation 74% on room air and placed her on a nonrebreather mask with still left her hypoxic so she was transported on CPAP.   Past Medical History:  Diagnosis Date  . Anxiety    panic attack- talks herself and takes deep breathes  . Depression   . Dyspnea    with exertion   . ESRD (end stage renal disease) ([place])    hemo TTHSAT  . FSGS (focal segmental glomerulosclerosis) 2011   By renal biopsy  . Head injury    age 7  . History of kidney stones    kidney stone  . Hx of lupus nephritis 2011   by renal biopsy  . Lupus (systemic lupus erythematosus) ([name])    followed by Dr. Amil Amen  . Obesity   . Pericarditis    age 19ish  . Renal stone   . Secondary hyperparathyroidism of renal origin ([place])   . TIA (transient ischemic attack)    no residual effects    Patient Active Problem List   Diagnosis Date Noted  . Allergic rhinitis 01/01/2020  . Anxiety disorder 06/26/2019  . HFrEF (heart failure with reduced ejection fraction) ([place]) 03/06/2019  . CAD (coronary artery disease) 03/06/2019  . Paroxysmal atrial fibrillation ([place])  03/06/2019  . Dyslipidemia 11/06/2017  . ESRD on dialysis ([place]) 06/27/2017  . CKD (chronic kidney disease) stage 5, GFR less than 15 ml/min ([name]) 02/15/2017  . Back pain at L4-L5 level 10/31/2014  . Muscle spasm of back 10/31/2014  . Visit for screening mammogram 09/30/2014  . Chronic maxillary sinusitis 07/04/2014  . Occipital headache 07/04/2014  . Increased urinary frequency 05/23/2014  . Falls 05/23/2014  . Prediabetes 05/23/2014  . Rash and nonspecific skin eruption 05/23/2014  . 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 Encounter for immunization 11/19/2013  . Lupus (systemic lupus erythematosus) ([place]) 09/24/2013  . CKD (chronic kidney disease) stage 4, GFR 15-29 ml/min ([name]) 07/02/2013  . TIA (transient ischemic attack) 05/05/2013  . Numbness and tingling of left side of face 05/04/2013  . Lupus ([place]) 04/15/2011  . 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  . 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    Past Surgical History:  Procedure Laterality Date  . AV FISTULA PLACEMENT Left 07/04/2017   Procedure: ARTERIOVENOUS (AV) FISTULA CREATION BRACHIOCEPHALIC;  Surgeon: [name], MD;  Location: [facility] OR;  Service: Vascular;  Laterality: Left;  . COLONOSCOPY W/ POLYPECTOMY    . IR FLUORO GUIDE CV LINE RIGHT  06/28/2017  . IR US GUIDE VASC ACCESS RIGHT  06/28/2017  . KIDNEY SURGERY     kidney transplant 2020  .  REVISION OF ARTERIOVENOUS GORETEX GRAFT Left 11/30/2017   Procedure: REVISION OF ARTERIOVENOUS FISTULA LEFT ARM Superfistulization and branch ligation.;  Surgeon: Waynetta Sandy, MD;  Location: [facility];  Service: Vascular;  Laterality: Left;     OB History    Gravida  2   Para  1   Term      Preterm      AB  1   Living  1     SAB  1   IAB      Ectopic      Multiple      Live Births              Family History  Problem Relation Age of Onset  . Diabetes Mother   . Hypertension Father   . Cancer Sister   . Hypertension Brother      Social History   Tobacco Use  . Smoking status: Never Smoker  . Smokeless tobacco: Never Used  Vaping Use  . Vaping Use: Never used  Substance Use Topics  . Alcohol use: No  . Drug use: No    Home Medications Prior to Admission medications   Medication Sig Start Date End Date Taking? Authorizing Provider  acetaminophen (TYLENOL) 500 MG tablet Take 1,000 mg by mouth every 6 (six) hours as needed for headache.    [provider]  aspirin 81 MG EC tablet Take by mouth. 11/21/19   [provider]  escitalopram (LEXAPRO) 5 MG tablet Take 5 mg by mouth daily. 11/28/19   [provider]  gabapentin (NEURONTIN) 300 MG capsule Take 1 capsule (300 mg total) by mouth daily. 01/14/20   [name], Betty G, MD  hydroxychloroquine (PLAQUENIL) 200 MG tablet Take 1 tablet (200 mg total) by mouth 2 (two) times daily. Patient taking differently: Take 200 mg by mouth daily. 12/25/13   Tresa Garter, MD  lidocaine (LIDODERM) 5 % Place 1 patch onto the skin daily. Remove & Discard patch within 12 hours or as directed by MD 01/05/19   [name], Betty G, MD  mycophenolate (MYFORTIC) 180 MG EC tablet Take 180 mg by mouth 2 (two) times daily.    [provider]  Naphazoline HCl (CLEAR EYES OP) Place 1 drop into both eyes daily as needed (dry eyes).    [provider]  nitroGLYCERIN (NITROSTAT) 0.4 MG SL tablet Place 0.4 mg under the tongue every 5 (five) minutes as needed for chest pain.     [provider]  predniSONE (DELTASONE) 5 MG tablet Take 5 mg by mouth daily. 05/16/19   [provider]  rosuvastatin (CRESTOR) 5 MG tablet Take 5 mg by mouth daily at 6 PM. 05/02/19   [provider]  sulfamethoxazole-trimethoprim (BACTRIM) 400-80 MG tablet Take 1 tablet by mouth every Monday, Wednesday, and Friday. Take 1 tablet by mouth every Monday, Wednesday, and Friday. 03/21/19   [provider]  tacrolimus (PROGRAF) 0.5 MG capsule Take  0.5 mg by mouth 2 (two) times daily. 1.$RemoveBefore'5mg'bBwAHSSSfHFTW$  in the am & $Re'1mg'Wlc$  in pm    [provider]  torsemide (DEMADEX) 20 MG tablet Take 40 mg by mouth daily. 06/20/19   [provider]    Allergies    Enalapril maleate, Penicillins, Chocolate, and Tape  Review of Systems   Review of Systems  Respiratory: Positive for shortness of breath.   All other systems reviewed and are negative.   Physical Exam Updated Vital Signs BP (!) 193/106   Pulse Marland Kitchen)  103   Temp 98 F (36.7 C) (Axillary)   Resp (!) 30   SpO2 98%   Physical Exam Vitals and nursing note reviewed.   65 year old female, on BiPAP and still demonstrating mild respiratory distress. Vital signs are significant for elevated blood pressure, respiratory rate and heart rate. Oxygen saturation is 98%, which is normal. Head is normocephalic and atraumatic. PERRLA, EOMI. Oropharynx is clear. Neck is nontender and supple without adenopathy or JVD. Back is nontender and there is no CVA tenderness. Lungs have bibasilar rales which go about two thirds of the way up, more prominent on the left.  There is no wheezes or rhonchi.. Chest is nontender. Heart has regular rate and rhythm without murmur. Abdomen is soft, flat, nontender without masses or hepatosplenomegaly and peristalsis is normoactive. Extremities have 3+ pedal edema.  edema, full range of motion is present.  AV fistula present in the left arm with thrill present. Skin is warm and dry without rash. Neurologic: Mental status is normal, cranial nerves are intact, there are no motor or sensory deficits.  ED Results / Procedures / Treatments   Labs (all labs ordered are listed, but only abnormal results are displayed) Labs Reviewed  BASIC METABOLIC PANEL - Abnormal; Notable for the following components:      Result Value   Sodium 134 (*)    CO2 21 (*)    Glucose, Bld 219 (*)    BUN 41 (*)    Creatinine, Ser 3.27 (*)    Calcium 8.4 (*)    GFR, Estimated 15 (*)     All other components within normal limits  BRAIN NATRIURETIC PEPTIDE - Abnormal; Notable for the following components:   B Natriuretic Peptide >4,500.0 (*)    All other components within normal limits  CBC WITH DIFFERENTIAL/PLATELET - Abnormal; Notable for the following components:   RBC 2.97 (*)    Hemoglobin 8.7 (*)    HCT 28.6 (*)    RDW 21.8 (*)    nRBC 0.3 (*)    All other components within normal limits  TROPONIN I (HIGH SENSITIVITY) - Abnormal; Notable for the following components:   Troponin I (High Sensitivity) 134 (*)    All other components within normal limits  TROPONIN I (HIGH SENSITIVITY) - Abnormal; Notable for the following components:   Troponin I (High Sensitivity) 373 (*)    All other components within normal limits  RESP PANEL BY RT-PCR (FLU A&B, COVID) [id number]    EKG EKG Interpretation  Date/Time:  Friday Jul 11 2020 04:03:37 EDT Ventricular Rate:  109 PR Interval:  155 QRS Duration: 126 QT Interval:  356 QTC Calculation: 480 R Axis:   -62 Text Interpretation: Sinus tachycardia Ventricular premature complex Nonspecific IVCD with LAD Left ventricular hypertrophy When compared with ECG of 06/22/2019, HEART RATE has increased Confirmed by Delora Fuel ([id number]) on 07/11/2020 4:18:53 AM   Radiology DG Chest Port 1 View  Result Date: 07/11/2020 CLINICAL DATA:  Shortness of breath EXAM: PORTABLE CHEST 1 VIEW COMPARISON:  06/22/2019 FINDINGS: Cardiomegaly, vascular pedicle widening, and diffuse interstitial opacity with small pleural effusions. Cephalized blood flow. Artifact from EKG leads. IMPRESSION: CHF. Electronically Signed   By: Monte Fantasia M.D.   On: 07/11/2020 04:34    Procedures Procedures  CRITICAL CARE Performed by: Delora Fuel Total critical care time: 85 minutes Critical care time was exclusive of separately billable procedures and treating other patients. Critical care was necessary to treat or prevent imminent or life-threatening  deterioration.  Critical care was time spent personally by me on the following activities: development of treatment plan with patient and/or surrogate as well as nursing, discussions with consultants, evaluation of patient's response to treatment, examination of patient, obtaining history from patient or surrogate, ordering and performing treatments and interventions, ordering and review of laboratory studies, ordering and review of radiographic studies, pulse oximetry and re-evaluation of patient's condition.  Medications Ordered in ED Medications  nitroGLYCERIN (NITROGLYN) 2 % ointment 1 inch (1 inch Topical Given 07/11/20 0459)  furosemide (LASIX) injection 40 mg (40 mg Intravenous Given 07/11/20 0458)    ED Course  I have reviewed the triage vital signs and the nursing notes.  Pertinent labs & imaging results that were available during my care of the patient were reviewed by me and considered in my medical decision making (see chart for details).   MDM Rules/Calculators/A&P                         Acute respiratory distress.  Physical exam is strongly suggestive of heart failure and pulmonary edema.  Also will check chest x-ray and screening labs.  Chest x-ray is consistent with heart failure and pulmonary edema.  Topical nitrates will be applied and she will be given intravenous furosemide.  Old records are reviewed, and creatinine on 07/08/2020 was 2.53 with GFR of 21.  Labs show evidence of acute kidney injury with creatinine 3.27.  Hemoglobin is stable.  Troponin is elevated, but this is felt to be secondary to demand ischemia and decreased renal clearance.  Troponin will need to be trended.  BNP is markedly elevated consistent with clinical presentation.  Patient initially was reluctant to receive IV furosemide, but I was able to convince her of the need for diuresis.  Case is discussed with Dr. Tamala Julian of [facility], who agrees to admit the patient.  Final Clinical Impression(s) /  ED Diagnoses Final diagnoses:  Acute pulmonary edema ([place])  Acute kidney injury (nontraumatic) ([name])  Normochromic normocytic anemia  Hyponatremia  Elevated troponin    Rx / DC Orders ED Discharge Orders    None       Delora Fuel, MD 68/11/57 [id number]    Delora Fuel, MD 35/59/74 [phone]

## 2020-07-11 NOTE — Progress Notes (Signed)
Patient taken off BiPAP and placed on 4L nasal cannula per RN request. Patient states she will try it. RT will continue to monitor.

## 2020-07-11 NOTE — ED Notes (Signed)
Notified Dr. Roxanne Mins of patients troponin being 134.

## 2020-07-11 NOTE — Telephone Encounter (Signed)
fyi

## 2020-07-11 NOTE — Consult Note (Signed)
Cardiology Consultation:   Patient ID: Jenna [name] MRN: [id number]; DOB: 02/18/1955  Admit date: 07/11/2020 Date of Consult: 07/11/2020  PCP:  [name], Betty G, MD   [place] Providers Cardiologist:  Dr. Norman Clay at [facility] Click here to update MD or APP on Care Team, Refresh:1}     Patient Profile:   Jenna [name] is a 65 y.o. 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 for AKI who is being seen 07/11/2020 for the evaluation of congestive heart failure at the request of Dr. Tamala Julian.  History of Present Illness:   [name] has received most of her major disease management at [facility] notes reviewed. She underwent kidney transplantation in 03/2018. In 10/2018 she was admitted with STEMI s/p DES to mLAD, cath otherwise showing mild diffuse disease in the LCx, ramus intermedius and moderate diffuse disease in the RCA. TTE showed EF 40-45%. Hospitalization complicated by development of atrial fib for which she was treated with amiodarone for a period of time. TEE showed LA thrombus therefore cardioversion was deferred. In subsequent encounters she was reported to be in NSR with intermittent palpitations. Notes reference repeat monitor negative for afib. Repeat TTE 01/2019 showed EF 35-40% with WMA felt unchanged from prior. TEE 04/2019 done to f/u clot revealed resolution of LA thrombus. Medical therapy has been limited by fatigue and hypotension. At [place] 04/2020 Plavix discontinued since she was >1 yr out from PCI. OV note recommended to continue Eliquis for CVA prophylaxis but do not see this has been on her medicine list in recent  months. It was stopped 12/2019 on discharge med list from admission for syncope due to orthostatic hypotension but do not see commentary on stopping in the notes available to [name]. The patient states she was told it was stopped because they had not seen any more atrial fib and the clot had resolved.  She has had a very tumultous course recently with a prolonged hospital admission 4/16-5/16/22 with diarrhea, elevated LFTs, fever, CMV viremia and AKI with Cr up to 6.6 with (with broaddifferential, including CMV, complicated UTI/pyelonephritis, ganciclovir toxicity, flare of autoimmune process/lupus nephritis, graft rejection, urate nephropathy).She had nephrotic range proteinuria. She was also treated for pseudomonas UTI and adjustment of her transplant regimen. 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 D/c med list included switch from Lasix to torsemide and discontinuation of spironolactone and losartan. 2D Echo 06/04/20 showed EF 35-40%, prolonged LV relaxation, mid to distal anterior, anteroseptal thinning and akinesis/dyskinesis, mild LAE, mild MR, mild TR, no pericardial effusion. She was seen by EP via telemed visit on 07/02/20 with worsening SOB and was encouraged to go back to the hospital but this did not occur. She was seen back in transplant clinic 07/08/20 and felt to be hypervolemic but at that time patient reported edema was improving somewhat so torsemide continued and OP f/u with cardiology was encouraged.  [name] did not feel well yesterday with general malaise and fatigue. She also has had some issues with sinus drainage which she states is not unusual for her. She felt very drained working with physical therapy and almost sent them away but stuck it out. She felt a  little better yesterday afternoon. Overnight she awoke around 2am with severe SOB and had to sit up. It got worse when she walked to another room so she called EMS. She recalls having perhaps 1 minute of chest pain around the time of  this episode but this was more of an afterthought rather than significant symptom. Her episode was most notable for the severe dyspnea. She was reported to be hypoxic at 78% RA requiring NRB then CPAP to maintain oxygenation. She was severely hypertensive at 200/100. She received Duoneb and ASA en route. Interestingly she says the ASA helped the most. She received NTG paste and $RemoveB'40mg'hAPrALxO$  IV Lasix in the ED. BP down to 120s-140s and now on nasal cannula. BNP >4500, BUN/Cr 41/3.27 (up from 2.53), Hgb 8.7 (previously 8.8), hsTroponin 134->373-->496, Covid negative. She reports good UOP thus far but continues to feel SOB. Denies any other recent chest pain, syncope, palpitations or med noncompliance. She reports that her edema has persisted since recent hospitalization. Nephrology consult pending per verbal report.   Past Medical History:  Diagnosis Date  . Anemia   . Anxiety    panic attack- talks herself and takes deep breathes  . CAD (coronary artery disease)    a. STEMI 10/2018 s/p DES to LAD.  Marland Kitchen Depression   . Dyspnea    with exertion   . ESRD (end stage renal disease) ([place])    hemo TTHSAT  . Essential hypertension   . FSGS (focal segmental glomerulosclerosis) 2011   By renal biopsy  . Head injury    age 23  . HFrEF (heart failure with reduced ejection fraction) ([place])   . History of kidney stones    kidney stone  . Hx of lupus nephritis 2011   by renal biopsy  . Ischemic cardiomyopathy   . Kidney transplant recipient   . LA thrombus 10/2018  . Lupus (systemic lupus erythematosus) ([name])    followed by Dr. Amil Amen  . Obesity   . Orthostatic hypotension   . PAF (paroxysmal atrial fibrillation) ([place])   . Parietal lobe infarction [facility])    a. remote parietal infarct on brain MRI 12/2019.  Marland Kitchen Pericarditis    age 44ish  . Renal stone   . Secondary hyperparathyroidism of renal origin ([place])   . SLE (systemic lupus erythematosus) ([place])   . TIA (transient ischemic attack)    no residual  effects    Past Surgical History:  Procedure Laterality Date  . AV FISTULA PLACEMENT Left 07/04/2017   Procedure: ARTERIOVENOUS (AV) FISTULA CREATION BRACHIOCEPHALIC;  Surgeon: [name], MD;  Location: [facility] OR;  Service: Vascular;  Laterality: Left;  . COLONOSCOPY W/ POLYPECTOMY    . IR FLUORO GUIDE CV LINE RIGHT  06/28/2017  . IR US GUIDE VASC ACCESS RIGHT  06/28/2017  . KIDNEY SURGERY     kidney transplant 2020  . REVISION OF ARTERIOVENOUS GORETEX GRAFT Left 11/30/2017   Procedure: REVISION OF ARTERIOVENOUS FISTULA LEFT ARM Superfistulization and branch ligation.;  Surgeon: Waynetta Sandy, MD;  Location: [facility];  Service: Vascular;  Laterality: Left;     Home Medications:  Prior to Admission medications   Medication Sig Start Date End Date Taking? Authorizing Provider  acetaminophen (TYLENOL) 500 MG tablet Take 1,000 mg by mouth every 6 (six) hours as needed for headache.   Yes [provider]  aspirin 81 MG EC tablet Take 81 mg by mouth daily. 11/21/19  Yes [provider]  nitroGLYCERIN (NITROSTAT) 0.4 MG SL tablet  Place 0.4 mg under the tongue every 5 (five) minutes as needed for chest pain.    Yes [provider]  escitalopram (LEXAPRO) 5 MG tablet Take 5 mg by mouth daily. 11/28/19   [provider]  gabapentin (NEURONTIN) 300 MG capsule Take 1 capsule (300 mg total) by mouth daily. 01/14/20   [name], Betty G, MD  hydroxychloroquine (PLAQUENIL) 200 MG tablet Take 1 tablet (200 mg total) by mouth 2 (two) times daily. Patient taking differently: Take 200 mg by mouth daily. 12/25/13   Tresa Garter, MD  lidocaine (LIDODERM) 5 % Place 1 patch onto the skin daily. Remove & Discard patch within 12 hours or as directed by MD 01/05/19   [name], Betty G, MD  mycophenolate (MYFORTIC) 180 MG EC tablet Take 180 mg by mouth 2 (two) times daily.    [provider]  Naphazoline HCl (CLEAR EYES OP) Place 1 drop into both eyes daily as  needed (dry eyes).    [provider]  predniSONE (DELTASONE) 5 MG tablet Take 5 mg by mouth daily. 05/16/19   [provider]  rosuvastatin (CRESTOR) 5 MG tablet Take 5 mg by mouth daily at 6 PM. 05/02/19   [provider]  sulfamethoxazole-trimethoprim (BACTRIM) 400-80 MG tablet Take 1 tablet by mouth every Monday, Wednesday, and Friday. Take 1 tablet by mouth every Monday, Wednesday, and Friday. 03/21/19   [provider]  tacrolimus (PROGRAF) 0.5 MG capsule Take 0.5 mg by mouth 2 (two) times daily. 1.$RemoveBefore'5mg'AyHCwRQOEKtub$  in the am & $Re'1mg'Geq$  in pm    [provider]  torsemide (DEMADEX) 20 MG tablet Take 40 mg by mouth daily. 06/20/19   [provider]    Inpatient Medications: Scheduled Meds: . furosemide  40 mg Intravenous BID  . heparin  5,000 Units Subcutaneous Q8H  . insulin aspart  0-6 Units Subcutaneous TID WC  . sodium chloride flush  3 mL Intravenous Q12H   Continuous Infusions: . sodium chloride     PRN Meds: sodium chloride, acetaminophen, ondansetron (ZOFRAN) IV, sodium chloride flush  Allergies:    Allergies  Allergen Reactions  . Enalapril Maleate Anaphylaxis and Other (See Comments)    Throat swells  . Penicillins Other (See Comments)    Made patient lightheaded PATIENT HAS HAD A PCN REACTION WITH IMMEDIATE RASH, FACIAL/TONGUE/THROAT SWELLING, SOB, OR LIGHTHEADEDNESS WITH HYPOTENSION:  #  #  YES  #  #  Has patient had a PCN reaction causing severe rash involving mucus membranes or skin necrosis: No Has patient had a PCN reaction that required hospitalization: No Has patient had a PCN reaction occurring within the last 10 years: No If all of the above answers are "NO", then may proceed with Cephalosporin use.   . Chocolate Nausea And Vomiting  . Tape Itching, Rash and Other (See Comments)    Social History:   Social History   Socioeconomic History  . Marital status: Legally Separated    Spouse name: Not on file  . Number of  children: 1  . Years of education: Not on file  . Highest education level: Not on file  Occupational History  . Not on file  Tobacco Use  . Smoking status: Never Smoker  . Smokeless tobacco: Never Used  Vaping Use  . Vaping Use: Never used  Substance and Sexual Activity  . Alcohol use: No  . Drug use: No  . Sexual activity: Not Currently  Other Topics Concern  . Not on file  Social  History Narrative   Right Handed   Lives in a two story home   Daughter recently got married    Social Determinants of Health   Financial Resource Strain: Low Risk   . Difficulty of Paying Living Expenses: Not very hard  Food Insecurity: No Food Insecurity  . Worried About Charity fundraiser in the Last Year: Never true  . Ran Out of Food in the Last Year: Never true  Transportation Needs: No Transportation Needs  . Lack of Transportation (Medical): No  . Lack of Transportation (Non-Medical): No  Physical Activity: Sufficiently Active  . Days of Exercise per Week: 5 days  . Minutes of Exercise per Session: 30 min  Stress: Stress Concern Present  . Feeling of Stress : To some extent  Social Connections: Moderately Isolated  . Frequency of Communication with Friends and Family: More than three times a week  . Frequency of Social Gatherings with Friends and Family: Once a week  . Attends Religious Services: 1 to 4 times per year  . Active Member of Clubs or Organizations: No  . Attends Archivist Meetings: Never  . Marital Status: Separated  Intimate Partner Violence: Not At Risk  . Fear of Current or Ex-Partner: No  . Emotionally Abused: No  . Physically Abused: No  . Sexually Abused: No    Family History:   Family History  Problem Relation Age of Onset  . Diabetes Mother   . Hypertension Father   . Cancer Sister   . Hypertension Brother      ROS:  Please see the history of present illness.  All other ROS reviewed and negative.     Physical Exam/Data:   Vitals:    07/11/20 0900 07/11/20 1000 07/11/20 1100 07/11/20 1415  BP: 127/82 135/83 (!) 141/85 (!) 169/86  Pulse: 88 87 85 100  Resp: (!) $RemoveB'21 18 20 'NoYfJvYi$ (!) 22  Temp:      TempSrc:      SpO2: 100% 100% 100% 99%    Intake/Output Summary (Last 24 hours) at 07/11/2020 1426 Last data filed at 07/11/2020 1333 Gross per 24 hour  Intake --  Output 200 ml  Net -200 ml   Last 3 Weights 05/28/2020 03/28/2020 02/22/2020  Weight (lbs) 198 lb 8 oz 207 lb 202 lb  Weight (kg) 90.039 kg 93.895 kg 91.627 kg     There is no height or weight on file to calculate BMI.  General: AAF in no acute distress  Head: Normocephalic, atraumatic, sclera non-icteric, no xanthomas, nares are without discharge. Neck: Negative for carotid bruits. JVP not elevated. Lungs: Diffusely diminished with crackles at bases, no wheezing or rhonchi. Mild increase in WOB present.  Heart: RRR S1 S2 + flow type SEM without rubs or gallops.  Abdomen: Soft, non-tender, non-distended with normoactive bowel sounds. No rebound/guarding. Extremities: No clubbing or cyanosis. 1+ soft, wide BLE edema without marked pitting. Distal pedal pulses are 2+ and equal bilaterally. Neuro: Alert and oriented X 3. Moves all extremities spontaneously. Psych:  Responds to questions appropriately with a normal/somewhat anxious affect.  EKG:  The EKG was personally reviewed and demonstrates:  Sinus tach/sinus arrhythmia, 103bpm, NSIVCD, left axis deviation, LVH, nonspecific TWI avL  Telemetry:  Telemetry was personally reviewed and demonstrates:  NSR  Relevant CV Studies: 2D Echo 06/04/20 PROCEDURE  A two-dimensional transthoracic echocardiogram with color flow and  Doppler was performed. Patient refused contrast.  -  SUMMARY  The left ventricular size is normal.  Mild  left ventricular hypertrophy  LV ejection fraction = 35-40%.  Left ventricular systolic function is moderately reduced.  Left ventricular filling pattern is prolonged relaxation.  There are  regional wall motion abnormalities as specified below.  Mid to distal anterior, anteroseptal thinning and akinesis/dyskinesis  The right ventricle is normal in size and function.  The left atrium is mildly dilated.  There is mild mitral regurgitation.  There is mild tricuspid regurgitation.  No significant stenosis seen  Estimated right ventricular systolic pressure is 28 mmHg.  Estimated right atrial pressure is 5 mmHg.Marland Kitchen  There is no pericardial effusion.   -  FINDINGS:  LEFT VENTRICLE  The left ventricular size is normal. Mild left ventricular  hypertrophy. LV ejection fraction = 35-40%. Left ventricular systolic  function is moderatelyreduced. Left ventricular filling pattern is  prolonged relaxation. There are regional wall motion abnormalities as  specified below. Mid to distal anterior, anteroseptal thinning and  akinesis/dyskinesis.   -  RIGHT VENTRICLE  The right ventricle is normal in size and function.   LEFT ATRIUM  The left atrium is mildly dilated.   RIGHT ATRIUM  Right atrial size is normal.  -  AORTIC VALVE  The aortic valve is trileaflet. There is mild aortic valve thickening.  There is no aortic stenosis. There is no aortic regurgitation.  -  MITRAL VALVE  There is mild mitral valve thickening. There is mild mitral  regurgitation.  -  TRICUSPID VALVE  Structurally normal tricuspid valve. There is mild tricuspid  regurgitation. Estimated right ventricular systolic pressure is 28  mmHg. Estimated right atrial pressure is 5 mmHg..  -  PULMONIC VALVE  The pulmonic valve is not well visualized. Trace pulmonic valvular  regurgitation.  -  ARTERIES  The aortic sinus is normal size. The ascending aorta is normal size.  -  VENOUS  Pulmonary venous flow pattern not well visualized. IVC size was  normal.  -  EFFUSION  There is no pericardial effusion.  -  -   MMode/2D Measurements & Calculations  IVSd: 1.2 cm  LA diam: 4.5 cm  ESV(MOD-sp4):     Ao sinus diam:  LVIDd: 5.0 cm  EDV(MOD-sp4):   76.9 ml       2.8 cm  LVPWd: 1.4 cm  119.3 ml     EDV(MOD-sp2):  LVIDs: 4.0 cm           127.2 ml                  ESV(MOD-sp2):                  72.6 ml      _______________________________________________________________________  LVOT diam:   SV(MOD-sp4):   IVC 1: 1.0 cm    LA area A2:  2.4 cm     42.4 ml                17.5 cm2         SI(MOD-sp4):         21.6 ml/m2      _______________________________________________________________________  LA area A4:   LA vol: 53.7 ml  LA vol index:    RA area A4:  23.2 cm2             27.4 ml/m2     11.0 cm2   Doppler Measurements & Calculations  MV E max vel:    MV dec time:    Ao V2 max:    PA max PG:  89.0 cm/sec  0.25 sec      182.0 cm/sec   3.2 mmHg  MV A max vel:             Ao max PG:  100.3 cm/sec              13.3 mmHg  MV E/A: 0.89  Med Peak E' Vel:  4.6 cm/sec  Lat Peak E' Vel:  7.0 cm/sec  E/Lat E`: 12.6  E/Med E`: 19.1       _______________________________________________________________________  TR max vel:     RV Peak E' Vel:  203.0 cm/sec    12.3 cm/sec  TR max PG:  16.7 mmHg   _______________________________________________________________________  Reading Physician:          [place], [id number] 06/04/2020 03:31 PM  LHC 10/2018  LHC and coronary angiogram via L CFA (renal allograft on right) for  anterior STEMI (note, R radial accessed with [name] but tiny vessel so aborted  and transitioned to femoral). L CFV accessed and 69F sheath inserted for  central venous access. LM engaged, free of significant. LAD occluded mid  segment. Ramus with mild irregularities. LCx mild irregularities. RCA  engaged, large dominant vessel with moderate disease.Heparin  bolus for  goal ACT, loaded with ticagrelor. PTCA of mid LAD with a 2.5 x 12  balloon, PCI using a 2.5 x 18 Onyx DES, post dilated with a 3.0 x 8 [place] to  high pressure. Started on tirofiban bolus/infusion given late sounding  presentation and distal embolization into diagonal branch after  ballooning. AV crossed, LVEDP 35. EBL < 10 cc (plus waste for  ACT/flushes), sheaths (arterial and venous) sutured and dressed, manual  pressure upstairs after aggrastat infusion complete for hemostasis, no  specimens.      Laboratory Data:  High Sensitivity Troponin:   Recent Labs  Lab 07/11/20 0406 07/11/20 0606 07/11/20 1145  TROPONINIHS 134* 373* 496*     Chemistry Recent Labs  Lab 07/11/20 0406 07/11/20 1145  NA 134*  --   K 4.0  --   CL 103  --   CO2 21*  --   GLUCOSE 219*  --   BUN 41*  --   CREATININE 3.27* 2.67*  CALCIUM 8.4*  --   GFRNONAA 15* 19*  ANIONGAP 10  --     No results for input(s): PROT, ALBUMIN, AST, ALT, ALKPHOS, BILITOT in the last 168 hours. Hematology Recent Labs  Lab 07/11/20 0406 07/11/20 1145  WBC 7.3 6.3  RBC 2.97* 2.75*  HGB 8.7* 8.0*  HCT 28.6* 26.6*  MCV 96.3 96.7  MCH 29.3 29.1  MCHC 30.4 30.1  RDW 21.8* 21.9*  PLT 172 134*   BNP Recent Labs  Lab 07/11/20 0406  BNP >4,500.0*    DDimer No results for input(s): DDIMER in the last 168 hours.   Radiology/Studies:  DG Chest Port 1 View  Result Date: 07/11/2020 CLINICAL DATA:  Shortness of breath EXAM: PORTABLE CHEST 1 VIEW COMPARISON:  06/22/2019 FINDINGS: Cardiomegaly, vascular pedicle widening, and diffuse interstitial opacity with small pleural effusions. Cephalized blood flow. Artifact from EKG leads. IMPRESSION: CHF. Electronically Signed   By: Monte Fantasia M.D.   On: 07/11/2020 04:34     Assessment and Plan:   1. Acute hypoxic respiratory failure, felt multifactorial  - in the setting of AKI superimposed on CKD stage IV + a/c combined CHF with accelerated HTN - recent  complex admission noted with AKI with broad differential as well as nephrotic range proteinuria  of 11k which can drive volume shifts as well - suspect renal status is a large proponent of recurrent volume overload issue - d/w Dr. Harrell Gave, would suggest nephrology drive the diuretic recommendations given complex transplant status with both recent and present AKI - we have suggested to internal medicine that transfer to [facility] be considered as she is followed there from a multidisciplinary standpoint including transplant team  2. Elevated troponin with history of CAD s/p PCI - hsTroponin 134->373->495 - predominant symptom was dyspnea, not chest pain - per d/w MD this is felt possibly related to demand ischemia in the setting of hypertensive emergency and pulmonary edema therefore she would not advise full dose heparin at this time - reasonable to follow-up echo - can obtain at destination hospital so they have access to images - when home meds are reconciled, suggest to resume home ASA, statin if no contraindications from medical standpoint - prior discharge med rec also included Toprol, awaiting clarification by pharmacy  3. Prior PAF diagnosed 10/2018 - last cardiology OV 04/2020 recommended to continue Eliquis but this was previously stopped per 12/2019 med list - do not have access to the previous event monitor reports but OV notes reference NSR - in NSR this admission - CHADSVASc of 6 therefore will defer long term anticoag plan to primary cardiologist - should revisit in-house if arrhythmia recurs on telemetry  4. Anemia - Hgb 8.7, similar to recent discharge of 8.8, but follow-up value 8.0 - per primary team  5. Essential HTN - follow BP with management above  Risk Assessment/Risk Scores:     HEAR Score (for undifferentiated chest pain):  HEAR Score: 4  New York Heart Association (NYHA) Functional Class NYHA Class IV upon arrival  CHA2DS2-VASc Score = 6  This  indicates a 9.7% annual risk of stroke. The patient's score is based upon: CHF History: Yes HTN History: Yes Diabetes History: No Stroke History: Yes Vascular Disease History: Yes Age Score: 0 Gender Score: 1     For questions or updates, please contact [place] Please consult [email or web address] for contact info under    Signed, Charlie Pitter, PA-C  07/11/2020 2:26 PM

## 2020-07-11 NOTE — ED Notes (Signed)
Report given to [place], RN on [place].

## 2020-07-11 NOTE — Progress Notes (Signed)
Pt refusing blood draw for type and screen, pt complaining that she was stuck many times and it was hurting her.  Spoke to the pt and she  agreed to have all the blood works at AM including type and screen, then will address in the Morning rounds about it.

## 2020-07-11 NOTE — Telephone Encounter (Signed)
FYI:  Pt called to let Dr. [name] know that she was rushed to the hospital [facility]) this morning around 2 a.m. b/c she was unable to get her breathe.  She just want to inform her doctor about it.

## 2020-07-11 NOTE — Progress Notes (Signed)
Spoke to Jenna [name], brother and provided update on the current status of the patient. Patient gave RN permission.

## 2020-07-11 NOTE — H&P (Addendum)
History and Physical    Jenna [name]:[id number] DOB: 1955-08-08 DOA: 07/11/2020  Referring MD/NP/PA: Dione [name] PCP: Jenna, Betty G, MD  Consultants: Jenna Salvage, MD-nephrology and all other providers at [facility] Patient coming from: Home via EMS  Chief Complaint: Shortness of breath  I have personally briefly reviewed patient's old medical records in [facility]   HPI: Jenna [name] is a 65 y.o. female with medical history significant of systolic CHF last EF 35-40%, ESRD/lupus nephritis/FSGS, s/p renal transplant on chronic immunosuppressive therapy, atrial fibrillation, left atrial thrombus  lupus nephritis, TIA, nephrolithiasis, anxiety, depression presents with complaints of shortness of breath.  History is limited as due to patient's respiratory distress she is currently on BIPAP.  She had recently been hospitalized at [facility] 4/16-5/16 for nausea, vomiting, and diarrhea.  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  Hospital course was complicated by AKI with creatinine rising up to 6.6 on 5/8 for which patient states torsemide was held, Pseudomonas UTI treated with cefepime, and multiple electrolyte abnormality.  Since being home patient complains of significant leg swelling and had been having difficulty sleeping throughout the night.  Associated symptoms included some chest discomfort with cough and intermittent wheezing.  She is not currently on oxygen at baseline.  Denies any significant fever, chills, nausea, vomiting, or diarrhea symptoms.  She had followed up with her primary care provider recently due to her leg swelling and shortness of breath and was advised to restart her torsemide.  Told by transplant to hold Bactrim and Myfortic.  In route with EMS patient is O2 saturations were noted to be 78% on room air.  Patient was placed on nonrebreather and given DuoNeb, 324 mg of aspirin and subsequently placed  on CPAP due to worsening shortness of breath.  Initial blood pressure noted to be 200/100.  ED Course: Upon admission into the emergency department patient was seen to be afebrile, Heart rate 94-105, respiration 21-42, blood pressures 124/75 193/106, and O2 saturation currently maintained on BiPAP.  Labs significant for hemoglobin 8.7, sodium 134, BUN 41, creatinine 3.27, glucose 219, BNP >4500, and high-sensitivity troponin 134.  Chest x-ray significant for cardiomegaly with interstitial edema and small bilateral pleural effusions.  COVID-19 and influenza screening were both negative.  Patient was given 40 mg of Lasix IV.  TRH called to admit.  Review of Systems  Unable to perform ROS: Severe respiratory distress  Constitutional: Positive for malaise/fatigue. Negative for fever.  Respiratory: Positive for shortness of breath and wheezing.   Cardiovascular: Positive for chest pain, orthopnea and leg swelling.  Gastrointestinal: Negative for abdominal pain.    Past Medical History:  Diagnosis Date  . Anxiety    panic attack- talks herself and takes deep breathes  . Depression   . Dyspnea    with exertion   . ESRD (end stage renal disease) ([name])    hemo TTHSAT  . FSGS (focal segmental glomerulosclerosis) 2011   By renal biopsy  . Head injury    age 18  . History of kidney stones    kidney stone  . Hx of lupus nephritis 2011   by renal biopsy  . Lupus (systemic lupus erythematosus) ([name])    followed by Dr. Dierdre [name]  . Obesity   . Pericarditis    age 65ish  . Renal stone   . Secondary hyperparathyroidism of renal origin ([name])   . TIA (transient ischemic attack)  no residual effects    Past Surgical History:  Procedure Laterality Date  . AV FISTULA PLACEMENT Left 07/04/2017   Procedure: ARTERIOVENOUS (AV) FISTULA CREATION BRACHIOCEPHALIC;  Surgeon: [name], MD;  Location: [facility] OR;  Service: Vascular;  Laterality: Left;  . COLONOSCOPY W/ POLYPECTOMY    . IR FLUORO GUIDE CV  LINE RIGHT  06/28/2017  . IR US GUIDE VASC ACCESS RIGHT  06/28/2017  . KIDNEY SURGERY     kidney transplant 2020  . REVISION OF ARTERIOVENOUS GORETEX GRAFT Left 11/30/2017   Procedure: REVISION OF ARTERIOVENOUS FISTULA LEFT ARM Superfistulization and branch ligation.;  Surgeon: Jenna Sandy, MD;  Location: [facility];  Service: Vascular;  Laterality: Left;     reports that she has never smoked. She has never used smokeless tobacco. She reports that she does not drink alcohol and does not use drugs.  Allergies  Allergen Reactions  . Enalapril Maleate Anaphylaxis and Other (See Comments)    Throat swells  . Penicillins Other (See Comments)    Made patient lightheaded PATIENT HAS HAD A PCN REACTION WITH IMMEDIATE RASH, FACIAL/TONGUE/THROAT SWELLING, SOB, OR LIGHTHEADEDNESS WITH HYPOTENSION:  #  #  YES  #  #  Has patient had a PCN reaction causing severe rash involving mucus membranes or skin necrosis: No Has patient had a PCN reaction that required hospitalization: No Has patient had a PCN reaction occurring within the last 10 years: No If all of the above answers are "NO", then may proceed with Cephalosporin use.   . Chocolate Nausea And Vomiting  . Tape Itching, Rash and Other (See Comments)    Family History  Problem Relation Age of Onset  . Diabetes Mother   . Hypertension Father   . Cancer Sister   . Hypertension Brother     Prior to Admission medications   Medication Sig Start Date End Date Taking? Authorizing Provider  acetaminophen (TYLENOL) 500 MG tablet Take 1,000 mg by mouth every 6 (six) hours as needed for headache.   Yes [provider]  aspirin 81 MG EC tablet Take 81 mg by mouth daily. 11/21/19  Yes [provider]  nitroGLYCERIN (NITROSTAT) 0.4 MG SL tablet Place 0.4 mg under the tongue every 5 (five) minutes as needed for chest pain.    Yes [provider]  escitalopram (LEXAPRO) 5 MG tablet Take 5 mg by mouth daily. 11/28/19    [provider]  gabapentin (NEURONTIN) 300 MG capsule Take 1 capsule (300 mg total) by mouth daily. 01/14/20   Jenna, Betty G, MD  hydroxychloroquine (PLAQUENIL) 200 MG tablet Take 1 tablet (200 mg total) by mouth 2 (two) times daily. Patient taking differently: Take 200 mg by mouth daily. 12/25/13   Tresa Garter, MD  lidocaine (LIDODERM) 5 % Place 1 patch onto the skin daily. Remove & Discard patch within 12 hours or as directed by MD 01/05/19   Jenna, Betty G, MD  mycophenolate (MYFORTIC) 180 MG EC tablet Take 180 mg by mouth 2 (two) times daily.    [provider]  Naphazoline HCl (CLEAR EYES OP) Place 1 drop into both eyes daily as needed (dry eyes).    [provider]  predniSONE (DELTASONE) 5 MG tablet Take 5 mg by mouth daily. 05/16/19   [provider]  rosuvastatin (CRESTOR) 5 MG tablet Take 5 mg by mouth daily at 6 PM. 05/02/19   [provider]  sulfamethoxazole-trimethoprim (BACTRIM) 400-80 MG tablet Take 1 tablet by mouth every  Monday, Wednesday, and Friday. Take 1 tablet by mouth every Monday, Wednesday, and Friday. 03/21/19   [provider]  tacrolimus (PROGRAF) 0.5 MG capsule Take 0.5 mg by mouth 2 (two) times daily. 1.$RemoveBefore'5mg'FNApFBcRASDSL$  in the am & $Re'1mg'MbM$  in pm    [provider]  torsemide (DEMADEX) 20 MG tablet Take 40 mg by mouth daily. 06/20/19   [provider]    Physical Exam:  Constitutional: Older female who appears to be in some discomfort Vitals:   07/11/20 0427 07/11/20 0530 07/11/20 0600 07/11/20 0700  BP:  (!) 143/76 124/75 126/77  Pulse:  94 94 90  Resp:  (!) 21 (!) 22 19  Temp:      TempSrc:      SpO2: 98% 100% 100% 100%   Eyes: PERRL, lids and conjunctivae normal ENMT: Mucous membranes are dry. Posterior pharynx clear of any exudate or lesions.   Neck: normal, supple, no masses, no thyromegaly.  JVD appreciated. Respiratory: Mildly tachypneic with crackles appreciated in the mid to lower lung  fields.  No significant wheezes or rhonchi.  Patient currently on BiPAP able to talk in shortened sentences.  Cardiovascular: Regular rate and rhythm, no murmurs / rubs / gallops.  +2 lower extremity edema. 2+ pedal pulses. No carotid bruits.  Left upper extremity fistula appreciated. Abdomen: no tenderness, no masses palpated. No hepatosplenomegaly. Bowel sounds positive.  Musculoskeletal: no clubbing / cyanosis. No joint deformity upper and lower extremities. Good ROM, no contractures. Normal muscle tone.  Skin: no rashes, lesions, ulcers. No induration Neurologic: CN 2-12 grossly intact. Sensation intact, DTR normal. Strength 5/5 in all 4.  Psychiatric: Normal judgment and insight. Alert and oriented x 3. Normal mood.     Labs on Admission: I have personally reviewed following labs and imaging studies  CBC: Recent Labs  Lab 07/11/20 0406  WBC 7.3  NEUTROABS 5.5  HGB 8.7*  HCT 28.6*  MCV 96.3  PLT 528   Basic Metabolic Panel: Recent Labs  Lab 07/11/20 0406  NA 134*  K 4.0  CL 103  CO2 21*  GLUCOSE 219*  BUN 41*  CREATININE 3.27*  CALCIUM 8.4*   GFR: CrCl cannot be calculated (Unknown ideal weight.). Liver Function Tests: No results for input(s): AST, ALT, ALKPHOS, BILITOT, PROT, ALBUMIN in the last 168 hours. No results for input(s): LIPASE, AMYLASE in the last 168 hours. No results for input(s): AMMONIA in the last 168 hours. Coagulation Profile: No results for input(s): INR, PROTIME in the last 168 hours. Cardiac Enzymes: No results for input(s): CKTOTAL, CKMB, CKMBINDEX, TROPONINI in the last 168 hours. BNP (last 3 results) No results for input(s): PROBNP in the last 8760 hours. HbA1C: No results for input(s): HGBA1C in the last 72 hours. CBG: No results for input(s): GLUCAP in the last 168 hours. Lipid Profile: No results for input(s): CHOL, HDL, LDLCALC, TRIG, CHOLHDL, LDLDIRECT in the last 72 hours. Thyroid Function Tests: No results for input(s): TSH,  T4TOTAL, FREET4, T3FREE, THYROIDAB in the last 72 hours. Anemia Panel: No results for input(s): VITAMINB12, FOLATE, FERRITIN, TIBC, IRON, RETICCTPCT in the last 72 hours. Urine analysis:    Component Value Date/Time   COLORURINE YELLOW 06/27/2017 1833   APPEARANCEUR CLEAR 06/27/2017 1833   LABSPEC 1.012 06/27/2017 1833   PHURINE 5.0 06/27/2017 1833   GLUCOSEU NEGATIVE 06/27/2017 1833   HGBUR NEGATIVE 06/27/2017 1833   BILIRUBINUR NEGATIVE 06/27/2017 1833   BILIRUBINUR neg 12/19/2014 [place] 06/27/2017 1833   PROTEINUR 30 (  A) 06/27/2017 1833   UROBILINOGEN 0.2 12/19/2014 1157   UROBILINOGEN 0.2 05/23/2014 1301   NITRITE NEGATIVE 06/27/2017 1833   LEUKOCYTESUR TRACE (A) 06/27/2017 1833   Sepsis Labs: Recent Results (from the past 240 hour(s))  Resp Panel by RT-PCR (Flu A&B, Covid) Nasopharyngeal Swab     Status: None   Collection Time: 07/11/20  4:07 AM   Specimen: Nasopharyngeal Swab; Nasopharyngeal(NP) swabs in vial transport medium  Result Value Ref Range Status   SARS Coronavirus 2 by RT PCR NEGATIVE NEGATIVE Final    Comment: (NOTE) SARS-CoV-2 target nucleic acids are NOT DETECTED.  The SARS-CoV-2 RNA is generally detectable in upper respiratory specimens during the acute phase of infection. The lowest concentration of SARS-CoV-2 viral copies this assay can detect is 138 copies/mL. A negative result does not preclude SARS-Cov-2 infection and should not be used as the sole basis for treatment or other patient management decisions. A negative result may occur with  improper specimen collection/handling, submission of specimen other than nasopharyngeal swab, presence of viral mutation(s) within the areas targeted by this assay, and inadequate number of viral copies(<138 copies/mL). A negative result must be combined with clinical observations, patient history, and epidemiological information. The expected result is Negative.  Fact Sheet for Patients:   EntrepreneurPulse.com.au  Fact Sheet for Healthcare Providers:  IncredibleEmployment.be  This test is no t yet approved or cleared by the [place] FDA and  has been authorized for detection and/or diagnosis of SARS-CoV-2 by FDA under an Emergency Use Authorization (EUA). This EUA will remain  in effect (meaning this test can be used) for the duration of the COVID-19 declaration under Section 564(b)(1) of the Act, 21 U.S.C.section 360bbb-3(b)(1), unless the authorization is terminated  or revoked sooner.       Influenza A by PCR NEGATIVE NEGATIVE Final   Influenza B by PCR NEGATIVE NEGATIVE Final    Comment: (NOTE) The Xpert Xpress SARS-CoV-2/FLU/RSV plus assay is intended as an aid in the diagnosis of influenza from Nasopharyngeal swab specimens and should not be used as a sole basis for treatment. Nasal washings and aspirates are unacceptable for Xpert Xpress SARS-CoV-2/FLU/RSV testing.  Fact Sheet for Patients: EntrepreneurPulse.com.au  Fact Sheet for Healthcare Providers: IncredibleEmployment.be  This test is not yet approved or cleared by the [place] FDA and has been authorized for detection and/or diagnosis of SARS-CoV-2 by FDA under an Emergency Use Authorization (EUA). This EUA will remain in effect (meaning this test can be used) for the duration of the COVID-19 declaration under Section 564(b)(1) of the Act, 21 U.S.C. section 360bbb-3(b)(1), unless the authorization is terminated or revoked.  Performed at [facility], [place] [address] [place], [place] [address]      Radiological Exams on Admission: DG Chest Port 1 View  Result Date: 07/11/2020 CLINICAL DATA:  Shortness of breath EXAM: PORTABLE CHEST 1 VIEW COMPARISON:  06/22/2019 FINDINGS: Cardiomegaly, vascular pedicle widening, and diffuse interstitial opacity with small pleural effusions. Cephalized blood flow. Artifact  from EKG leads. IMPRESSION: CHF. Electronically Signed   By: Monte Fantasia M.D.   On: 07/11/2020 04:34    EKG: Independently reviewed.  Sinus tachycardia 109 bpm with PVC and LVH.  Assessment/Plan Acute respiratory failure with hypoxia secondary to acute on chronic combined systolic and diastolic CHF: Patient was found to have O2 saturations of 78% on room air placed on BiPAP with improvement in work of breathing.  She had recently been taken off of torsemide during previous hospitalization which may  likely be the cause.  Chest x-ray showed cardiomegaly with interstitial edema and small pleural effusions.  BNP was greater than 4500.  Just recently had an echocardiogram at [facility] noting EF 35 to 40% with prolonged relaxation on 4/20.  Patient was given 40 mg of Lasix IV. -Admit to a progressive bed -Heart failure order set utilized -Continuous pulse oximetry with oxygen to maintain O2 saturations -BiPAP and wean as tolerated -Strict I&Os and daily weight -Lasix IV adjusted per nephrology -Cardiology consulted, but recommending transfer due to complexity of patient care with recent cytomegalovirus infection.  Attempted to transfer patient to [facility] by contacting [phone], but was advised that they were not excepting patient on the wait list and no beds were available.  Advised to try again tomorrow  Elevated troponin: Acute.  High-sensitivity troponin 134->373.  EKG without significant ischemic changes.  She complained of chest discomfort earlier, but reports that that is now resolved. -Continue to trend cardiac troponin -Continue aspirin  Acute kidney injury superimposed on chronic kidney disease stage IV Status post renal transplant on immunosuppressive: Patient with prior history of FSGS/lupus nephritis and is s/p cadaver renal transplant.  On admission creatinine elevated to 3.27 with BUN 41.  Creatinine previously had been 2.53 on 07/08/2020.    -Continue to monitor kidney function daily with diuresis -Continue Prograf and prednisone per nephrology -Nephrology consulted, will follow-up for any further recommendations  Recent CMV infection: Prior to admission patient had been diagnosed with CMV viremia on 06/09/2020.  She had been started on IV ganciclovir and had been discharged home valcyte 450 mg every other day. -Continue Valcyte  Paroxysmal atrial fibrillation: Patient appears to be in sinus rhythm at this time.  Not on any anticoagulation. -Continue to monitor  Hyponatremia: Acute.  Sodium 134.  Suspect hypervolemic hyponatremia given CHF. -Continue to monitor  Anemia of chronic disease: Hemoglobin 8.7 g/dL which appears lower than previous baseline which was around 10.  Patient denies any reports of bleeding. -Continue to monitor hemoglobin daily  History of lupus -Continue Plaquenil when able  Hyperglycemia: Acute.  On admission patient's glucose elevated at 216.  She has a prior history of prediabetes. -Hypoglycemia protocol -Check hemoglobin A1c -CBGs before every meal with very sensitive SSI(Patient declined fingersticks and therefore order was discontinued)  DVT prophylaxis: Heparin Code Status:  Full Family Communication: Attempted to call patient's daughter but no answer billable. Disposition Plan: home Consults called: Cardiology and nephrology Admission status: Inpatient, require more than 2 midnight stay  Norval Morton MD [facility]   If 7PM-7AM, please contact night-coverage   07/11/2020, 8:11 AM

## 2020-07-11 NOTE — ED Triage Notes (Signed)
Patient arrives via EMS from home due severe shortness of breath and chest discomfort. Per EMS, the patient's oxygen saturations were 78% on Room Air. Patient was placed on non-rebreather and given duo-neb + $RemoveB'324mg'mcTAdPdP$  of asprin en route. Placed on cpap due to worsening sob and elevated bp (200/100) with ems.  Patient has a history of renal transplant.

## 2020-07-11 NOTE — Consult Note (Signed)
[facility] Renal Consultation Note  Requesting MD: Fuller Plan, MD Indication for Consultation:  AKI   Chief complaint: shortness of breath  HPI: Jenna [name] is a 65 y.o. 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 fib, TIA, anxiety and depression who presented to the hospital with shortness of breath.  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  Cr peak at 6.6 on 5/8.  Her diuretics were held per pt and then restarted on torsemide.  She was increased from "one pill" to "two pills".  She has been on bipap in ER.  sats 78% with EMS on room air per charting.  No urine output charted.  She's trying to get to her medication list and cannot access the EMR and this frustrates her.  She just saw transplant a couple of days ago and their note states not on bactrim or myfortic (hold per med instructions) though they are are on her medication list.   Creat  Date/Time Value Ref Range Status  05/23/2014 01:01 PM 2.11 (H) 0.50 - 1.10 mg/dL Final  04/28/2011 06:37 PM 1.52 (H) 0.50 - 1.10 mg/dL Final   Creatinine, Ser  Date/Time Value Ref Range Status  07/11/2020 11:45 AM 2.67 (H) 0.44 - 1.00 mg/dL Final  07/11/2020 04:06 AM 3.27 (H) 0.44 - 1.00 mg/dL Final  06/22/2019 07:53 PM 2.35 (H) 0.44 - 1.00 mg/dL Final  10/14/2018 11:09 PM 1.62 (H) 0.44 - 1.00 mg/dL Final  09/18/2017 05:49 PM 3.76 (H) 0.44 - 1.00 mg/dL Final  08/02/2017 08:14 PM 2.32 (H) 0.44 - 1.00 mg/dL Final  08/02/2017 06:15 PM 1.89 (H) 0.44 - 1.00 mg/dL Final  07/05/2017 12:38 PM 5.70 (H) 0.44 - 1.00 mg/dL Final  07/04/2017 07:51 AM 5.07 (H) 0.44 - 1.00 mg/dL Final  06/30/2017 08:19 PM 4.51 (H) 0.44 - 1.00 mg/dL Final  06/29/2017 09:26 AM 4.94 (H) 0.44 - 1.00 mg/dL Final  06/28/2017  06:29 AM 5.65 (H) 0.44 - 1.00 mg/dL Final  06/27/2017 03:53 PM 5.49 (H) 0.44 - 1.00 mg/dL Final  06/23/2017 04:00 PM 5.70 (H) 0.44 - 1.00 mg/dL Final  08/10/2013 01:55 PM 2.20 (H) 0.50 - 1.10 mg/dL Final  05/05/2013 11:35 AM 2.16 (H) 0.50 - 1.10 mg/dL Final  05/04/2013 09:17 PM 2.21 (H) 0.50 - 1.10 mg/dL Final  01/24/2010 03:00 AM 2.22 (H) 0.4 - 1.2 mg/dL Final  01/23/2010 06:16 AM 2.37 (H) 0.4 - 1.2 mg/dL Final  01/22/2010 09:42 AM 2.60 (H) 0.4 - 1.2 mg/dL Final  01/21/2010 05:30 AM 3.15 (H) 0.4 - 1.2 mg/dL Final  01/20/2010 09:19 PM 3.12 (H) 0.4 - 1.2 mg/dL Final  07/27/2009 11:50 AM 0.85 0.4 - 1.2 mg/dL Final     PMHx:   Past Medical History:  Diagnosis Date  . Anemia   . Anxiety    panic attack- talks herself and takes deep breathes  . CAD (coronary artery disease)    a. STEMI 10/2018 s/p DES to LAD.  Marland Kitchen Depression   . Dyspnea    with exertion   . ESRD (end stage renal disease) ([place])    hemo TTHSAT  . Essential hypertension   . FSGS (focal segmental glomerulosclerosis) 2011   By renal biopsy  . Head injury    age 60  . HFrEF (heart  failure with reduced ejection fraction) ([place])   . History of kidney stones    kidney stone  . Hx of lupus nephritis 2011   by renal biopsy  . Ischemic cardiomyopathy   . Kidney transplant recipient   . LA thrombus 10/2018  . Lupus (systemic lupus erythematosus) ([name])    followed by Dr. Amil Amen  . Obesity   . Orthostatic hypotension   . PAF (paroxysmal atrial fibrillation) ([place])   . 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 remote parietal infarct on brain MRI 12/2019.  Marland Kitchen Pericarditis    age 74ish  . Renal stone   . Secondary hyperparathyroidism of renal origin ([place])   . SLE (systemic lupus erythematosus) ([place])   . TIA (transient ischemic attack)    no residual effects    Past Surgical History:  Procedure Laterality Date  . AV FISTULA PLACEMENT Left 07/04/2017   Procedure: ARTERIOVENOUS (AV) FISTULA CREATION BRACHIOCEPHALIC;   Surgeon: [name], MD;  Location: [facility] OR;  Service: Vascular;  Laterality: Left;  . COLONOSCOPY W/ POLYPECTOMY    . IR FLUORO GUIDE CV LINE RIGHT  06/28/2017  . IR US GUIDE VASC ACCESS RIGHT  06/28/2017  . KIDNEY SURGERY     kidney transplant 2020  . REVISION OF ARTERIOVENOUS GORETEX GRAFT Left 11/30/2017   Procedure: REVISION OF ARTERIOVENOUS FISTULA LEFT ARM Superfistulization and branch ligation.;  Surgeon: Waynetta Sandy, MD;  Location[facility];  Service: Vascular;  Laterality: Left;    Family Hx:  Family History  Problem Relation Age of Onset  . Diabetes Mother   . Hypertension Father   . Cancer Sister   . Hypertension Brother     Social History:  reports that she has never smoked. She has never used smokeless tobacco. She reports that she does not drink alcohol and does not use drugs.  Allergies:  Allergies  Allergen Reactions  . Enalapril Maleate Anaphylaxis and Other (See Comments)    Throat swells  . Penicillins Other (See Comments)    Made patient lightheaded PATIENT HAS HAD A PCN REACTION WITH IMMEDIATE RASH, FACIAL/TONGUE/THROAT SWELLING, SOB, OR LIGHTHEADEDNESS WITH HYPOTENSION:  #  #  YES  #  #  Has patient had a PCN reaction causing severe rash involving mucus membranes or skin necrosis: No Has patient had a PCN reaction that required hospitalization: No Has patient had a PCN reaction occurring within the last 10 years: No If all of the above answers are "NO", then may proceed with Cephalosporin use.   . Chocolate Nausea And Vomiting  . Tape Itching, Rash and Other (See Comments)    Medications: Prior to Admission medications   Medication Sig Start Date End Date Taking? Authorizing Provider  acetaminophen (TYLENOL) 500 MG tablet Take 1,000 mg by mouth every 6 (six) hours as needed for headache.   Yes [provider]  aspirin 81 MG EC tablet Take 81 mg by mouth daily. 11/21/19  Yes [provider]  nitroGLYCERIN (NITROSTAT) 0.4 MG  SL tablet Place 0.4 mg under the tongue every 5 (five) minutes as needed for chest pain.    Yes [provider]  escitalopram (LEXAPRO) 5 MG tablet Take 5 mg by mouth daily. 11/28/19   [provider]  gabapentin (NEURONTIN) 300 MG capsule Take 1 capsule (300 mg total) by mouth daily. 01/14/20   [name], Betty G, MD  hydroxychloroquine (PLAQUENIL) 200 MG tablet Take 1 tablet (200 mg total) by mouth 2 (two) times daily. Patient taking  differently: Take 200 mg by mouth daily. 12/25/13   Tresa Garter, MD  lidocaine (LIDODERM) 5 % Place 1 patch onto the skin daily. Remove & Discard patch within 12 hours or as directed by MD 01/05/19   [name], Betty G, MD  Naphazoline HCl (CLEAR EYES OP) Place 1 drop into both eyes daily as needed (dry eyes).    [provider]  predniSONE (DELTASONE) 5 MG tablet Take 5 mg by mouth daily. 05/16/19   [provider]  rosuvastatin (CRESTOR) 5 MG tablet Take 5 mg by mouth daily at 6 PM. 05/02/19   [provider]  tacrolimus (PROGRAF) 0.5 MG capsule 1 mg in AM and 0.5 mg PM    [provider]  torsemide (DEMADEX) 20 MG tablet Take 40 mg by mouth daily. 06/20/19   [provider]    I have reviewed the patient's current and reported prior to admission medications.  The list initially imported above does not appear correct as patient states not on myfortic and not on bactrim.  Med rec is a little difficult as she is short of breath and difficult for her to tell me what she is doing with some medications   Labs:  BMP Latest Ref Rng & Units 07/11/2020 07/11/2020 06/22/2019  Glucose 70 - 99 mg/dL - 219(H) 98  BUN 8 - 23 mg/dL - 41(H) 42(H)  Creatinine 0.44 - 1.00 mg/dL 2.67(H) 3.27(H) 2.35(H)  Sodium 135 - 145 mmol/L - 134(L) 137  Potassium 3.5 - 5.1 mmol/L - 4.0 4.2  Chloride 98 - 111 mmol/L - 103 103  CO2 22 - 32 mmol/L - 21(L) 22  Calcium 8.9 - 10.3 mg/dL - 8.4(L) 7.9(L)     ROS:  Pertinent items noted  in HPI and remainder of comprehensive ROS otherwise negative. limited by shortness of breath   Physical Exam: Vitals:   07/11/20 1100 07/11/20 1415  BP: (!) 141/85 (!) 169/86  Pulse: 85 100  Resp: 20 (!) 22  Temp:    SpO2: 100% 99%     General: elderly female in bed short of breath with prolonged speech  HEENT: NCAT Eyes: EOMI sclera anicteric Neck: supple trachea midline Heart: S1S2 no rub Lungs: clear but reduced; increased work of breathing with exertion; on 4.5 liters oxyen Abdomen: soft/nd/nd; LLQ renal transplant not tender Extremities: edema bilateral lower extremities; no cyanosis or clubbing Skin: no rash on extremities exposed Neuro: alert and oriented x 3 provides hx and follows commands Psych normal mood and affect Access LUE AVF bruit and thrill   Assessment/Plan:  # AKI  - improving and may have cardiorenal component as better with diuretics   - Continue supportive measures - Check UA and up/cr ratio  # Acute on chronic systolic CHF  - got low dose IV lasix this AM - Lasix 80 mg IV once now and repeat 2200   # Acute hypoxic resp failure  - 2/2 CHF; optimize volume as above  # 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  She states not on myfortic and not on bactrim   # CMV viremia - recent prolonged hospitalization at [facility] for the same  - per transplant note she has been on valcyte 450 mg every  48 hours given renal function and is s/p IV gancyclovir 4/26 - 06/30/20.   Would agree with the recommendation to move the patient to [facility] given her recent complicated history.  We are temporizing her from a volume standpoint.  Per primary team [facility] is full right now and they will try again tomorrow.    Claudia Desanctis 07/11/2020, 3:25 PM

## 2020-07-12 ENCOUNTER — Inpatient Hospital Stay (HOSPITAL_COMMUNITY): Payer: Medicare Other

## 2020-07-12 DIAGNOSIS — D6862 Lupus anticoagulant syndrome: Secondary | ICD-10-CM

## 2020-07-12 DIAGNOSIS — Z940 Kidney transplant status: Secondary | ICD-10-CM | POA: Diagnosis not present

## 2020-07-12 DIAGNOSIS — N189 Chronic kidney disease, unspecified: Secondary | ICD-10-CM | POA: Diagnosis not present

## 2020-07-12 DIAGNOSIS — N179 Acute kidney failure, unspecified: Secondary | ICD-10-CM | POA: Diagnosis not present

## 2020-07-12 DIAGNOSIS — I480 Paroxysmal atrial fibrillation: Secondary | ICD-10-CM | POA: Diagnosis not present

## 2020-07-12 DIAGNOSIS — I5043 Acute on chronic combined systolic (congestive) and diastolic (congestive) heart failure: Secondary | ICD-10-CM | POA: Diagnosis not present

## 2020-07-12 DIAGNOSIS — J9601 Acute respiratory failure with hypoxia: Secondary | ICD-10-CM | POA: Diagnosis not present

## 2020-07-12 LAB — GLUCOSE, CAPILLARY
Glucose-Capillary: 115 mg/dL — ABNORMAL HIGH (ref 70–99)
Glucose-Capillary: 109 mg/dL — ABNORMAL HIGH (ref 70–99)

## 2020-07-12 LAB — RENAL FUNCTION PANEL
Albumin: 2.5 g/dL — ABNORMAL LOW (ref 3.5–5.0)
Albumin: 2.7 g/dL — ABNORMAL LOW (ref 3.5–5.0)
Anion gap: 10 (ref 5–15)
Anion gap: 11 (ref 5–15)
BUN: 45 mg/dL — ABNORMAL HIGH (ref 8–23)
BUN: 47 mg/dL — ABNORMAL HIGH (ref 8–23)
CO2: 21 mmol/L — ABNORMAL LOW (ref 22–32)
CO2: 21 mmol/L — ABNORMAL LOW (ref 22–32)
Calcium: 8.4 mg/dL — ABNORMAL LOW (ref 8.9–10.3)
Calcium: 8.4 mg/dL — ABNORMAL LOW (ref 8.9–10.3)
Chloride: 105 mmol/L (ref 98–111)
Chloride: 102 mmol/L (ref 98–111)
Creatinine, Ser: 3.28 mg/dL — ABNORMAL HIGH (ref 0.44–1.00)
Creatinine, Ser: 3.42 mg/dL — ABNORMAL HIGH (ref 0.44–1.00)
GFR, Estimated: 15 mL/min — ABNORMAL LOW (ref >60)
GFR, Estimated: 14 mL/min — ABNORMAL LOW (ref >60)
Glucose, Bld: 99 mg/dL (ref 70–99)
Glucose, Bld: 118 mg/dL — ABNORMAL HIGH (ref 70–99)
Phosphorus: 4.2 mg/dL (ref 2.5–4.6)
Phosphorus: 4 mg/dL (ref 2.5–4.6)
Potassium: 4.7 mmol/L (ref 3.5–5.1)
Potassium: 4.3 mmol/L (ref 3.5–5.1)
Sodium: 136 mmol/L (ref 135–145)
Sodium: 134 mmol/L — ABNORMAL LOW (ref 135–145)

## 2020-07-12 LAB — CBC WITH DIFFERENTIAL/PLATELET
Abs Immature Granulocytes: 0.06 10*3/uL (ref 0.00–0.07)
Abs Immature Granulocytes: 0 10*3/uL (ref 0.00–0.07)
Basophils Absolute: 0 10*3/uL (ref 0.0–0.1)
Basophils Absolute: 0 10*3/uL (ref 0.0–0.1)
Basophils Relative: 0 %
Basophils Relative: 0 %
Eosinophils Absolute: 0 10*3/uL (ref 0.0–0.5)
Eosinophils Absolute: 0 10*3/uL (ref 0.0–0.5)
Eosinophils Relative: 0 %
Eosinophils Relative: 0 %
HCT: 25.9 % — ABNORMAL LOW (ref 36.0–46.0)
HCT: 27.2 % — ABNORMAL LOW (ref 36.0–46.0)
Hemoglobin: 8 g/dL — ABNORMAL LOW (ref 12.0–15.0)
Hemoglobin: 8.3 g/dL — ABNORMAL LOW (ref 12.0–15.0)
Immature Granulocytes: 1 %
Lymphocytes Relative: 5 %
Lymphocytes Relative: 2 %
Lymphs Abs: 0.3 10*3/uL — ABNORMAL LOW (ref 0.7–4.0)
Lymphs Abs: 0.1 10*3/uL — ABNORMAL LOW (ref 0.7–4.0)
MCH: 29.4 pg (ref 26.0–34.0)
MCH: 29 pg (ref 26.0–34.0)
MCHC: 30.9 g/dL (ref 30.0–36.0)
MCHC: 30.5 g/dL (ref 30.0–36.0)
MCV: 95.2 fL (ref 80.0–100.0)
MCV: 95.1 fL (ref 80.0–100.0)
Monocytes Absolute: 1 10*3/uL (ref 0.1–1.0)
Monocytes Absolute: 0.2 10*3/uL (ref 0.1–1.0)
Monocytes Relative: 16 %
Monocytes Relative: 4 %
Neutro Abs: 4.9 10*3/uL (ref 1.7–7.7)
Neutro Abs: 5.8 10*3/uL (ref 1.7–7.7)
Neutrophils Relative %: 78 %
Neutrophils Relative %: 94 %
Platelets: 120 10*3/uL — ABNORMAL LOW (ref 150–400)
Platelets: 124 10*3/uL — ABNORMAL LOW (ref 150–400)
RBC: 2.72 MIL/uL — ABNORMAL LOW (ref 3.87–5.11)
RBC: 2.86 MIL/uL — ABNORMAL LOW (ref 3.87–5.11)
RDW: 22 % — ABNORMAL HIGH (ref 11.5–15.5)
RDW: 22 % — ABNORMAL HIGH (ref 11.5–15.5)
WBC: 6.3 10*3/uL (ref 4.0–10.5)
WBC: 6.2 10*3/uL (ref 4.0–10.5)
nRBC: 0 % (ref 0.0–0.2)
nRBC: 0 % (ref 0.0–0.2)
nRBC: 1 /100 WBC — ABNORMAL HIGH

## 2020-07-12 LAB — URINALYSIS, COMPLETE (UACMP) WITH MICROSCOPIC
Bilirubin Urine: NEGATIVE
Glucose, UA: NEGATIVE mg/dL
Ketones, ur: NEGATIVE mg/dL
Nitrite: NEGATIVE
Protein, ur: >=300 mg/dL — AB
Specific Gravity, Urine: 1.013 (ref 1.005–1.030)
WBC, UA: >50 WBC/hpf — ABNORMAL HIGH (ref 0–5)
pH: 5 (ref 5.0–8.0)

## 2020-07-12 LAB — HEPATIC FUNCTION PANEL
ALT: 18 U/L (ref 0–44)
AST: 32 U/L (ref 15–41)
Albumin: 2.5 g/dL — ABNORMAL LOW (ref 3.5–5.0)
Alkaline Phosphatase: 49 U/L (ref 38–126)
Bilirubin, Direct: 0.2 mg/dL (ref 0.0–0.2)
Indirect Bilirubin: 1 mg/dL — ABNORMAL HIGH (ref 0.3–0.9)
Total Bilirubin: 1.2 mg/dL (ref 0.3–1.2)
Total Protein: 6.6 g/dL (ref 6.5–8.1)

## 2020-07-12 LAB — HEMOGLOBIN A1C
Hgb A1c MFr Bld: 5.3 % (ref 4.8–5.6)
Mean Plasma Glucose: 105 mg/dL

## 2020-07-12 LAB — PROTEIN / CREATININE RATIO, URINE
Creatinine, Urine: 141.51 mg/dL
Protein Creatinine Ratio: 2.76 mg/mg{Cre} — ABNORMAL HIGH (ref 0.00–0.15)
Total Protein, Urine: 390 mg/dL

## 2020-07-12 LAB — TYPE AND SCREEN
ABO/RH(D): A POS
Antibody Screen: NEGATIVE

## 2020-07-12 LAB — MAGNESIUM: Magnesium: 1.8 mg/dL (ref 1.7–2.4)

## 2020-07-12 LAB — CORTISOL: Cortisol, Plasma: 29.5 ug/dL

## 2020-07-12 LAB — MRSA PCR SCREENING: MRSA by PCR: NEGATIVE

## 2020-07-12 MED ORDER — LEVOFLOXACIN IN D5W 250 MG/50ML IV SOLN
250.0000 mg | INTRAVENOUS | Status: DC
  Administered 2020-07-12: 250 mg via INTRAVENOUS
  Filled 2020-07-12: qty 50

## 2020-07-12 MED ORDER — METRONIDAZOLE 500 MG/100ML IV SOLN
500.0000 mg | Freq: Three times a day (TID) | INTRAVENOUS | Status: DC
  Administered 2020-07-12: 500 mg via INTRAVENOUS
  Filled 2020-07-12: qty 100

## 2020-07-12 MED ORDER — SODIUM CHLORIDE 0.9 % IV SOLN
2.0000 g | INTRAVENOUS | Status: DC
  Administered 2020-07-12: 2 g via INTRAVENOUS
  Filled 2020-07-12: qty 2

## 2020-07-12 MED ORDER — BUSPIRONE HCL 5 MG PO TABS
5.0000 mg | ORAL_TABLET | Freq: Two times a day (BID) | ORAL | Status: DC
  Administered 2020-07-12: 5 mg via ORAL
  Filled 2020-07-12: qty 1

## 2020-07-12 MED ORDER — LINEZOLID 600 MG PO TABS: 600.0000 mg | ORAL_TABLET | Freq: Two times a day (BID) | ORAL | Status: DC

## 2020-07-12 MED ORDER — METOPROLOL SUCCINATE ER 25 MG PO TB24
12.5000 mg | ORAL_TABLET | Freq: Every day | ORAL | Status: DC
  Administered 2020-07-12: 12.5 mg via ORAL
  Filled 2020-07-12: qty 1

## 2020-07-12 MED ORDER — SODIUM CHLORIDE 0.9 % IV SOLN: 2.0000 g | INTRAVENOUS | Status: DC

## 2020-07-12 MED ORDER — LINEZOLID 600 MG PO TABS
600.0000 mg | ORAL_TABLET | Freq: Two times a day (BID) | ORAL | Status: DC
  Administered 2020-07-12 (×2): 600 mg via ORAL
  Filled 2020-07-12 (×2): qty 1

## 2020-07-12 MED ORDER — SODIUM CHLORIDE 0.9 % IV SOLN: 1.0000 g | INTRAVENOUS | Status: DC

## 2020-07-12 MED ORDER — METOPROLOL SUCCINATE ER 25 MG PO TB24
12.5000 mg | ORAL_TABLET | Freq: Every day | ORAL | Status: DC
Start: 2020-07-13 — End: 2020-08-26

## 2020-07-12 NOTE — Evaluation (Signed)
Clinical/Bedside Swallow Evaluation Patient Details  Name: Jenna [name] MRN: [id number] Date of Birth: December 14, 1955  Today's Date: 07/12/2020 Time: SLP Start Time (ACUTE ONLY): 1438 SLP Stop Time (ACUTE ONLY): 1447 SLP Time Calculation (min) (ACUTE ONLY): 9 min  Past Medical History:  Past Medical History:  Diagnosis Date  . Anemia   . Anxiety    panic attack- talks herself and takes deep breathes  . CAD (coronary artery disease)    a. STEMI 10/2018 s/p DES to LAD.  Marland Kitchen Depression   . Dyspnea    with exertion   . ESRD (end stage renal disease) ([place])    hemo TTHSAT  . Essential hypertension   . FSGS (focal segmental glomerulosclerosis) 2011   By renal biopsy  . Head injury    age 65  . HFrEF (heart failure with reduced ejection fraction) ([place])   . History of kidney stones    kidney stone  . Hx of lupus nephritis 2011   by renal biopsy  . Ischemic cardiomyopathy   . Kidney transplant recipient   . LA thrombus 10/2018  . Lupus (systemic lupus erythematosus) ([name])    followed by Dr. Amil Amen  . Obesity   . Orthostatic hypotension   . PAF (paroxysmal atrial fibrillation) ([place])   . Parietal lobe infarction [facility])    a. remote parietal infarct on brain MRI 12/2019.  Marland Kitchen Pericarditis    age 20ish  . Renal stone   . Secondary hyperparathyroidism of renal origin ([place])   . SLE (systemic lupus erythematosus) ([place])   . TIA (transient ischemic attack)    no residual effects   Past Surgical History:  Past Surgical History:  Procedure Laterality Date  . AV FISTULA PLACEMENT Left 07/04/2017   Procedure: ARTERIOVENOUS (AV) FISTULA CREATION BRACHIOCEPHALIC;  Surgeon: [name], MD;  Location: [facility] OR;  Service: Vascular;  Laterality: Left;  . COLONOSCOPY W/ POLYPECTOMY    . IR FLUORO GUIDE CV LINE RIGHT  06/28/2017  . IR US GUIDE VASC ACCESS RIGHT  06/28/2017  . KIDNEY SURGERY     kidney transplant 2020  . REVISION OF ARTERIOVENOUS GORETEX GRAFT Left 11/30/2017   Procedure: REVISION  OF ARTERIOVENOUS FISTULA LEFT ARM Superfistulization and branch ligation.;  Surgeon: Waynetta Sandy, MD;  Location: [place];  Service: Vascular;  Laterality: Left;   HPI:  Jenna [name] is a 65 y.o. female  who presented on 5/27 with worsening shortness of breath, hypoxia, CXR concerning for pulmonary edema, requiring BiPAP and diuretics. Overnight having fever up to 100.79F Tmax, concern for aspiration pneumonia per hospitalist. CXR 5/27 & 28 c/w CHR, but pneumonia is not excluded. Pt has history of lupus/lupus nephritis & FSGS with ESRD s/p  DDKT  In 2/20 CMV D+/R- (IS of pred 10, tacro 1/0.5, myfortic and bactrim on hold)  She also has hx of paroxysmal Afib/LA thrombus (resolved), TIA, CAD c/b chronic systolic CHF.   Assessment / Plan / Recommendation Clinical Impression  Pt presents with functional swallowing as assessed clinically.  Pt is edentulous and reports she typically wears dentures with PO intake, but does not require them.  Her dentures are present, but she declined to place them for trials today.  Pt tolerated all consistencies trialed with no clinical s/s of aspiration and exhibited good oral clearance of solids. Pt accepted only small trials in limited number 2/2 poor apetite and abdominal discomfort. Pt reported needing to have BM and RN assisted her with transfer to bedside commode.  Recommend continuing regular texture diet with thin liquids.  SLP to follow for diet tolerance x1.   SLP Visit Diagnosis: Dysphagia, unspecified (R13.10)    Aspiration Risk  No limitations    Diet Recommendation Regular;Thin liquid   Liquid Administration via: Cup;Straw Medication Administration: Whole meds with liquid Supervision: Patient able to self feed Compensations: Slow rate;Small sips/bites Postural Changes: Seated upright at 90 degrees    Other  Recommendations Oral Care Recommendations: Oral care BID   Follow up Recommendations None      Frequency and Duration min 1  x/week  1 week       Prognosis Prognosis for Safe Diet Advancement: Good      Swallow Study   General Date of Onset: 07/11/20 HPI: Jenna [name] is a 65 y.o. female  who presented on 5/27 with worsening shortness of breath, hypoxia, CXR concerning for pulmonary edema, requiring BiPAP and diuretics. Overnight having fever up to 100.58F Tmax, concern for aspiration pneumonia per hospitalist. CXR 5/27 & 28 c/w CHR, but pneumonia is not excluded. Pt has history of lupus/lupus nephritis & FSGS with ESRD s/p  DDKT  In 2/20 CMV D+/R- (IS of pred 10, tacro 1/0.5, myfortic and bactrim on hold)  She also has hx of paroxysmal Afib/LA thrombus (resolved), TIA, CAD c/b chronic systolic CHF. Type of Study: Bedside Swallow Evaluation Previous Swallow Assessment: None this admission.  BSE 04/2013 with recommendations for regular diet with thin liquids Diet Prior to this Study: Regular Respiratory Status: Nasal cannula History of Recent Intubation: No Behavior/Cognition: Alert;Cooperative;Pleasant mood Oral Cavity Assessment: Within Functional Limits Oral Cavity - Dentition: Edentulous (pt declined to place dentures) Vision: Functional for self-feeding Self-Feeding Abilities: Able to feed self Patient Positioning: Upright in bed;Upright in chair Baseline Vocal Quality: Normal Volitional Cough: Strong Volitional Swallow: Able to elicit    Oral/Motor/Sensory Function Overall Oral Motor/Sensory Function: Within functional limits Facial ROM: Reduced right;Reduced left Facial Symmetry: Within Functional Limits Lingual ROM: Within Functional Limits Lingual Symmetry: Within Functional Limits Lingual Strength: Within Functional Limits Velum: Within Functional Limits Mandible: Within Functional Limits   Ice Chips Ice chips: Not tested   Thin Liquid Thin Liquid: Within functional limits Presentation: Straw    Nectar Thick Nectar Thick Liquid: Not tested   Honey Thick Honey Thick Liquid: Not tested    Puree Puree: Within functional limits Presentation: Spoon   Solid     Solid: Within functional limits Presentation: [place], [place], [place] Office: [phone]; Pager (5/28): [phone] 07/12/2020,3:10 PM

## 2020-07-12 NOTE — Hospital Course (Signed)
3 white female community dwelling [facility] kidney transplant 03/2018 (lupus)-prednisone/tacrolimus currently STEMI 11/04/2020 + DES P A. fib LA thrombus [resolved] HFrEF 35-40% CMV Viremia@ [facility] 06/10/20-->06/30/2020-continuing Valcyte every 48  Evaluate 07/11/2020 [facility] HP SOB clear sputum, increasing pedal edema O2 sats 74%-placed on CPAP after nonrebreather did not work  Cardiology/nephrology consulted-troponin trending 400 range BNP >4500 GFR at [facility] 07/08/2020 about 2.5  Last CMV virology studies from [place] have gone from>10,000,000-->6750 on 06/30/2020 per [facility] labs

## 2020-07-12 NOTE — Progress Notes (Signed)
PROGRESS NOTE   Jenna [name]  WIO:[id number] DOB: 02-24-1955 DOA: 07/11/2020 PCP: [name], Betty G, MD  Brief Narrative:  45 white female community dwelling [facility] kidney transplant 03/2018 (lupus)-prednisone/tacrolimus currently STEMI 11/04/2020 + DES P A. fib LA thrombus [resolved] HFrEF 35-40% CMV Viremia@ [facility] 06/10/20-->06/30/2020-continuing Valcyte every 48  Evaluate 07/11/2020 [facility] HP SOB clear sputum, increasing pedal edema O2 sats 74%-placed on CPAP after nonrebreather did not work  Cardiology/nephrology consulted-troponin trending 400 range BNP >4500 GFR at [facility] 07/08/2020 about 2.5  Last CMV Viral L [facility] have gone from>10,000,000-->2190 on 07/08/2020 per [facility] labs--bactrim d/c for PJP proph 2/2 Leukopenia   Hospital-Problem based course HAP/Aspiration with sepsis on admit ?Cystitis? ?diarrhea  SLP feels low risk aspiration MRSA screen neg Appreciate ID--Abx adjusted Cefepime/Linezolide now BC x 2 pending, follow sputum Catheterized UA large bacteria [would be covered by Cephalosporin]--follow UC given recent UTI Random cortisol reasonable--hold stress dosing currently Watch stools--test if bristol 6 or > recnt CMV viremia  Valcyte 450 q 48 or as per ID decompensated HF on admit causing acute hypoxic resp failure basleine seems to have NYHA 2 Home meds torsemide 40 qd basleine wght~203--211 Much improved On 2-4 liters currently desat screen as able daily wghts--standing Stemi 10/2020 DES-->MLAD Plavix d/c 1 yr out from Stent toprol as below ESRD [fsgs] s/p [place] transplant 03/2018 follows at [facility] On prednisone/Tacrolimus,  currently Per nephrology lasix--day-day assessment diuretic needs--UA >300 protein She is wait-listed to go to [facility]--they have her details Lupus [ flare?]  Cont plaquenil 200 qd Get dsdna, c3/c4, ESR and CRP--follow Urine studies ordered by nephro Watch TCP For now hold stress dosing steroid Pa  Fib [place] 04/2019 TEE showed resolution  LAA thrombus No longer eliquisper Last OV 07/08/20 Start lower dose toprol xl 12.5--check hemodynamics over next 24 hrs Anxiety Resume home buspar 5 bid   DVT prophylaxis: heparin Code Status: full Family Communication: daughter updated x 2 today on phone Disposition:  Status is: Inpatient  Remains inpatient appropriate because:Hemodynamically unstable, Persistent severe electrolyte disturbances, Altered mental status and Ongoing diagnostic testing needed not appropriate for outpatient work up   Dispo: The patient is from: Home              Anticipated d/c is to: Home              Patient currently is not medically stable to d/c.   Difficult to place patient No       Consultants:   nephro  ID  Cardiology  Procedures: n  Antimicrobials:  Per ID    Subjective: Seen this am delcined labs but relented Fevers noted.  Some diarr No cp SOB improved compared to prior  Objective: Vitals:   07/12/20 1237 07/12/20 1239 07/12/20 1346 07/12/20 1451  BP: (!) 148/84 (!) 148/84 (!) 145/78 140/86  Pulse: (!) 102 (!) 102 95 97  Resp: (!) 22 (!) 22 (!) 28 20  Temp: (!) 101.8 F (38.8 C) (!) 101.8 F (38.8 C) 100 F (37.8 C) 99.1 F (37.3 C)  TempSrc: Oral  Oral Oral  SpO2: 100%  94% 97%  Weight:      Height:        Intake/Output Summary (Last 24 hours) at 07/12/2020 1632 Last data filed at 07/12/2020 1439 Gross per 24 hour  Intake 540 ml  Output 400 ml  Net 140 ml   Filed Weights   07/11/20 1623 07/12/20 0621  Weight: 94.1 kg 95.9 kg    Examination:  Weak but overall improved , from this am eomi no ict Neck soft abd soft cta b with decreased AE post lat + LE edema 1+ Neuro intact no focal deficit   Data Reviewed: personally reviewed   CBC    Component Value Date/Time   WBC 6.2 07/12/2020 1148   RBC 2.86 (L) 07/12/2020 1148   HGB 8.3 (L) 07/12/2020 1148   HCT 27.2 (L) 07/12/2020 1148   PLT 124 (L) 07/12/2020 1148   MCV 95.1 07/12/2020 1148    MCV 82.4 04/28/2011 1841   MCH 29.0 07/12/2020 1148   MCHC 30.5 07/12/2020 1148   RDW 22.0 (H) 07/12/2020 1148   LYMPHSABS 0.1 (L) 07/12/2020 1148   MONOABS 0.2 07/12/2020 1148   EOSABS 0.0 07/12/2020 1148   BASOSABS 0.0 07/12/2020 1148   CMP Latest Ref Rng & Units 07/12/2020 07/12/2020 07/11/2020  Glucose 70 - 99 mg/dL 118(H) 99 -  BUN 8 - 23 mg/dL 47(H) 45(H) -  Creatinine 0.44 - 1.00 mg/dL 3.42(H) 3.28(H) 2.67(H)  Sodium 135 - 145 mmol/L 134(L) 136 -  Potassium 3.5 - 5.1 mmol/L 4.3 4.7 -  Chloride 98 - 111 mmol/L 102 105 -  CO2 22 - 32 mmol/L 21(L) 21(L) -  Calcium 8.9 - 10.3 mg/dL 8.4(L) 8.4(L) -  Total Protein 6.5 - 8.1 g/dL - 6.6 -  Total Bilirubin 0.3 - 1.2 mg/dL - 1.2 -  Alkaline Phos 38 - 126 U/L - 49 -  AST 15 - 41 U/L - 32 -  ALT 0 - 44 U/L - 18 -     Radiology Studies: DG CHEST PORT 1 VIEW  Result Date: 07/12/2020 CLINICAL DATA:  Follow-up pneumonia. EXAM: PORTABLE CHEST 1 VIEW COMPARISON:  07/11/2020 and older studies. FINDINGS: Cardiac silhouette remains enlarged. Interstitial opacities have improved. Opacity at the right lung base has increased, consistent with an increase in pleural fluid and probable atelectasis. Pneumonia is possible. Opacity at the left lung base is stable also consistent with atelectasis or pneumonia. No pneumothorax. IMPRESSION: 1. Improved interstitial thickening from the previous day's exam suggests improved interstitial pulmonary edema/CHF. 2. Increase in opacity at the right lung base, suspected to be a combination of increased pleural fluid with either atelectasis or pneumonia. Persistent left lung base opacity consistent with pleural fluid with either atelectasis or pneumonia. Electronically Signed   By: Lajean Manes M.D.   On: 07/12/2020 09:21   DG Chest Port 1 View  Result Date: 07/11/2020 CLINICAL DATA:  Shortness of breath EXAM: PORTABLE CHEST 1 VIEW COMPARISON:  06/22/2019 FINDINGS: Cardiomegaly, vascular pedicle widening, and diffuse  interstitial opacity with small pleural effusions. Cephalized blood flow. Artifact from EKG leads. IMPRESSION: CHF. Electronically Signed   By: Monte Fantasia M.D.   On: 07/11/2020 04:34     Scheduled Meds: . heparin  5,000 Units Subcutaneous Q8H  . hydroxychloroquine  200 mg Oral Daily  . linezolid  600 mg Oral Q12H  . predniSONE  5 mg Oral Q breakfast  . rosuvastatin  5 mg Oral q1800  . sodium chloride flush  3 mL Intravenous Q12H  . tacrolimus  0.5 mg Oral Q24H  . tacrolimus  1 mg Oral Q24H  . valGANciclovir  450 mg Oral Q48H   Continuous Infusions: . sodium chloride    . ceFEPime (MAXIPIME) IV 2 g (07/12/20 1356)     LOS: 1 day   Time spent: [place], MD [facility] To contact the attending provider between 7A-7P or the covering provider  during after hours 7P-7A, please log into the web site [email or web address] and access using universal [place] password for that web site. If you do not have the password, please call the hospital operator.  07/12/2020, 4:32 PM

## 2020-07-12 NOTE — Progress Notes (Signed)
SLP Cancellation Note  Patient Details Name: Jenna [name] MRN: [id number] DOB: 24-Mar-1955   Cancelled treatment:        Attempted to see pt for clinical evaluation of swallowing.  Pt was unavailable at time of attempt.  SLP will reattempt as schedule permits.   Celedonio Savage, MA, [place] Office: [phone]; Pager (5/28): [phone] 07/12/2020, 10:46 AM

## 2020-07-12 NOTE — Progress Notes (Signed)
Pt transported to [facility]. Reports given to transport team. All pt belongings sent with pt in her bag.

## 2020-07-12 NOTE — Discharge Summary (Signed)
Physician Discharge Summary  Jenna [name] JKD:[id number] DOB: [birth year] DOA: 07/11/2020  PCP: [name], Betty G, MD  Admit date: 07/11/2020 Discharge date: 07/12/2020  Time spent: 37 minutes  Recommendations for Outpatient Follow-up:  Transferring to Hospitalist service at [facility] for transplant sub-speciality care consider ds dna, complement levels in am [see note] Watch creatinine--stress dose steroids and consider other immunosuppressant addition if creatinine rises Follow urine culture performed 5/28 am Monitor oxygenation and daily needs for diuretics  Discharge Diagnoses:  MAIN problem for hospitalization   Sepsis and acute decompensated HF 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 ?diarrhea             SLP feels low risk aspiration MRSA screen neg Appreciate ID--Abx adjusted Cefepime/Linezolide now BC x 2 pending, follow sputum Catheterized UA large  bacteria [would be covered by Cephalosporin]--follow UC given recent UTI Random cortisol 29-hold stress dosing currently Watch stools--test if bristol 6 or > for CDIFF recnt CMV viremia             Valcyte 450 q 48 or as per ID Dr Caren Griffins snider decompensated HF on admit causing acute hypoxic resp failure baseline seems to have NYHA 2 Home meds torsemide 40 qd basleine wght~203--211 Much improved On 2-4 liters currently desat screen as able daily wghts--standing Stemi 10/2020 DES-->MLAD Plavix d/c 1 yr out from Stent toprol as below 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 Harrie Jeans lasix--day-day assessment diuretic needs---TORSEMIDE HELD--not on daily diuretics at this time- UA >300 protein Lupus [ flare?] vs early transplant rejection??             Cont plaquenil 200 qd Get dsdna, c3/c4, ESR and CRP-THESE SHOULD BE CONSIDERED BY [facility] ACCEPTING MD [unlikely but a possibility]-follow Urine studies ordered by nephro Watch TCP For now hold stress dosing steroid--would stress dose if creatinine rises Pa  Fib [place] 04/2019 TEE showed resolution LAA thrombus No longer eliquis per Last OV 07/08/20 [no rationale for d/c] Start lower dose toprol xl 12.5--check hemodynamics over next 24 hrs Anxiety Resume home buspar 5 bid     Discharge Exam: Vitals:   07/12/20 1451 07/12/20 1634  BP: 140/86 (!) 145/81  Pulse: 97 91  Resp: 20 16  Temp: 99.1 F (37.3 C) 98.4 F (36.9 C)  SpO2: 97% 100%      General Exam on discharge  Seen earlier in day--see PN  Discharge Instructions   Discharge Instructions    Diet - low sodium heart healthy   Complete by: As directed    Increase  activity slowly   Complete by: As directed      Allergies as of 07/12/2020      Reactions   Enalapril Maleate Anaphylaxis, Swelling, Other (See Comments)   Throat swells   Penicillins Other (See Comments)   Made patient  lightheaded PATIENT HAS HAD A PCN REACTION WITH IMMEDIATE RASH, FACIAL/TONGUE/THROAT SWELLING, SOB, OR LIGHTHEADEDNESS WITH HYPOTENSION:  #  #  YES  #  #  Has patient had a PCN reaction causing severe rash involving mucus membranes or skin necrosis: No Has patient had a PCN reaction that required hospitalization: No Has patient had a PCN reaction occurring within the last 10 years: No If all of the above answers are "NO", then may proceed with Cephalosporin use.   Chocolate Nausea And Vomiting   Tape Itching, Rash, Other (See Comments)      Medication List    STOP taking these medications   b complex-vitamin c-folic acid 0.8 MG Tabs tablet   carboxymethylcellulose 0.5 % Soln Commonly known as: REFRESH PLUS   cetirizine 10 MG tablet Commonly known as: ZYRTEC   lidocaine 5 % Commonly known as: Lidoderm   mycophenolate 180 MG EC tablet Commonly known as: MYFORTIC   pantoprazole 40 MG tablet Commonly known as: PROTONIX   simethicone 80 MG chewable tablet Commonly known as: MYLICON   sulfamethoxazole-trimethoprim 400-80 MG tablet Commonly known as: BACTRIM   torsemide 20 MG tablet Commonly known as: DEMADEX     TAKE these medications   acetaminophen 500 MG tablet Commonly known as: TYLENOL Take 1,000 mg by mouth every 6 (six) hours as needed for headache.   aspirin 81 MG EC tablet Take 81 mg by mouth every morning.   busPIRone 5 MG tablet Commonly known as: BUSPAR Take 5 mg by mouth 2 (two) times daily.   ceFEPIme 2 g in sodium chloride 0.9 % 100 mL Inject 2 g into the vein daily. Start taking on: Jul 13, 2020   escitalopram 5 MG tablet Commonly known as: LEXAPRO Take 5 mg by mouth daily.   gabapentin 300 MG capsule Commonly known as: NEURONTIN Take 1 capsule (300 mg total) by mouth daily. What changed: Another medication with the same name was removed. Continue taking this medication, and follow the directions you see here.   hydroxychloroquine 200 MG  tablet Commonly known as: PLAQUENIL Take 1 tablet (200 mg total) by mouth 2 (two) times daily. What changed: when to take this   linezolid 600 MG tablet Commonly known as: ZYVOX Take 1 tablet (600 mg total) by mouth every 12 (twelve) hours.   metoprolol succinate 25 MG 24 hr tablet Commonly known as: TOPROL-XL Take 0.5 tablets (12.5 mg total) by mouth daily. Start taking on: Jul 13, 2020 What changed: how much to take   nitroGLYCERIN 0.4 MG SL tablet Commonly known as: NITROSTAT Place 0.4 mg under the tongue every 5 (five) minutes as needed for chest pain.   predniSONE 5 MG tablet Commonly known as: DELTASONE Take 5 mg by mouth daily with lunch.   rosuvastatin 5 MG tablet Commonly known as: CRESTOR Take 5 mg by mouth daily at 6 PM.   tacrolimus 0.5 MG capsule Commonly known as: PROGRAF Take 0.5-1 mg by mouth See admin instructions. Take 2 capsules (1 mg) by mouth every morning and 1 capsule (0.5 mg) at night   valGANciclovir 450 MG tablet Commonly known as: VALCYTE Take 450 mg by mouth every morning.      Allergies  Allergen Reactions  .  Enalapril Maleate Anaphylaxis, Swelling and Other (See Comments)    Throat swells  . Penicillins Other (See Comments)    Made patient lightheaded PATIENT HAS HAD A PCN REACTION WITH IMMEDIATE RASH, FACIAL/TONGUE/THROAT SWELLING, SOB, OR LIGHTHEADEDNESS WITH HYPOTENSION:  #  #  YES  #  #  Has patient had a PCN reaction causing severe rash involving mucus membranes or skin necrosis: No Has patient had a PCN reaction that required hospitalization: No Has patient had a PCN reaction occurring within the last 10 years: No If all of the above answers are "NO", then may proceed with Cephalosporin use.   . Chocolate Nausea And Vomiting  . Tape Itching, Rash and Other (See Comments)      The results of significant diagnostics from this hospitalization (including imaging, microbiology, ancillary and laboratory) are listed below for  reference.    Significant Diagnostic Studies: DG CHEST PORT 1 VIEW  Result Date: 07/12/2020 CLINICAL DATA:  Follow-up pneumonia. EXAM: PORTABLE CHEST 1 VIEW COMPARISON:  07/11/2020 and older studies. FINDINGS: Cardiac silhouette remains enlarged. Interstitial opacities have improved. Opacity at the right lung base has increased, consistent with an increase in pleural fluid and probable atelectasis. Pneumonia is possible. Opacity at the left lung base is stable also consistent with atelectasis or pneumonia. No pneumothorax. IMPRESSION: 1. Improved interstitial thickening from the previous day's exam suggests improved interstitial pulmonary edema/CHF. 2. Increase in opacity at the right lung base, suspected to be a combination of increased pleural fluid with either atelectasis or pneumonia. Persistent left lung base opacity consistent with pleural fluid with either atelectasis or pneumonia. Electronically Signed   By: Lajean Manes M.D.   On: 07/12/2020 09:21   DG Chest Port 1 View  Result Date: 07/11/2020 CLINICAL DATA:  Shortness of breath EXAM: PORTABLE CHEST 1 VIEW COMPARISON:  06/22/2019 FINDINGS: Cardiomegaly, vascular pedicle widening, and diffuse interstitial opacity with small pleural effusions. Cephalized blood flow. Artifact from EKG leads. IMPRESSION: CHF. Electronically Signed   By: Monte Fantasia M.D.   On: 07/11/2020 04:34    Microbiology: Recent Results (from the past 240 hour(s))  Resp Panel by RT-PCR (Flu A&B, Covid) Nasopharyngeal Swab     Status: None   Collection Time: 07/11/20  4:07 AM   Specimen: Nasopharyngeal Swab; Nasopharyngeal(NP) swabs in vial transport medium  Result Value Ref Range Status   SARS Coronavirus 2 by RT PCR NEGATIVE NEGATIVE Final    Comment: (NOTE) SARS-CoV-2 target nucleic acids are NOT DETECTED.  The SARS-CoV-2 RNA is generally detectable in upper respiratory specimens during the acute phase of infection. The lowest concentration of SARS-CoV-2  viral copies this assay can detect is 138 copies/mL. A negative result does not preclude SARS-Cov-2 infection and should not be used as the sole basis for treatment or other patient management decisions. A negative result may occur with  improper specimen collection/handling, submission of specimen other than nasopharyngeal swab, presence of viral mutation(s) within the areas targeted by this assay, and inadequate number of viral copies(<138 copies/mL). A negative result must be combined with clinical observations, patient history, and epidemiological information. The expected result is Negative.  Fact Sheet for Patients:  EntrepreneurPulse.com.au  Fact Sheet for Healthcare Providers:  IncredibleEmployment.be  This test is no t yet approved or cleared by the [place] FDA and  has been authorized for detection and/or diagnosis of SARS-CoV-2 by FDA under an Emergency Use Authorization (EUA). This EUA will remain  in effect (meaning this test can be used) for the duration  of the COVID-19 declaration under Section 564(b)(1) of the Act, 21 U.S.C.section 360bbb-3(b)(1), unless the authorization is terminated  or revoked sooner.       Influenza A by PCR NEGATIVE NEGATIVE Final   Influenza B by PCR NEGATIVE NEGATIVE Final    Comment: (NOTE) The Xpert Xpress SARS-CoV-2/FLU/RSV plus assay is intended as an aid in the diagnosis of influenza from Nasopharyngeal swab specimens and should not be used as a sole basis for treatment. Nasal washings and aspirates are unacceptable for Xpert Xpress SARS-CoV-2/FLU/RSV testing.  Fact Sheet for Patients: EntrepreneurPulse.com.au  Fact Sheet for Healthcare Providers: IncredibleEmployment.be  This test is not yet approved or cleared by the [place] FDA and has been authorized for detection and/or diagnosis of SARS-CoV-2 by FDA under an Emergency Use Authorization  (EUA). This EUA will remain in effect (meaning this test can be used) for the duration of the COVID-19 declaration under Section 564(b)(1) of the Act, 21 U.S.C. section 360bbb-3(b)(1), unless the authorization is terminated or revoked.  Performed at [facility], [place] [address], [place], [place] [address]   MRSA PCR Screening     Status: None   Collection Time: 07/12/20  1:03 PM   Specimen: Nasal Mucosa; Nasopharyngeal  Result Value Ref Range Status   MRSA by PCR NEGATIVE NEGATIVE Final    Comment:        The GeneXpert MRSA Assay (FDA approved for NASAL specimens only), is one component of a comprehensive MRSA colonization surveillance program. It is not intended to diagnose MRSA infection nor to guide or monitor treatment for MRSA infections. Performed at [facility], [place] [address], [place], [place] [address]      Labs: Basic Metabolic Panel: Recent Labs  Lab 07/11/20 0406 07/11/20 1145 07/12/20 0253 07/12/20 1148  NA 134*  --  136 134*  K 4.0  --  4.7 4.3  CL 103  --  105 102  CO2 21*  --  21* 21*  GLUCOSE 219*  --  99 118*  BUN 41*  --  45* 47*  CREATININE 3.27* 2.67* 3.28* 3.42*  CALCIUM 8.4*  --  8.4* 8.4*  MG  --   --   --  1.8  PHOS  --   --  4.2 4.0   Liver Function Tests: Recent Labs  Lab 07/12/20 0253 07/12/20 1148  AST 32  --   ALT 18  --   ALKPHOS 49  --   BILITOT 1.2  --   PROT 6.6  --   ALBUMIN 2.5*  2.5* 2.7*   No results for input(s): LIPASE, AMYLASE in the last 168 hours. No results for input(s): AMMONIA in the last 168 hours. CBC: Recent Labs  Lab 07/11/20 0406 07/11/20 1145 07/12/20 0253 07/12/20 1148  WBC 7.3 6.3 6.3 6.2  NEUTROABS 5.5  --  4.9 5.8  HGB 8.7* 8.0* 8.0* 8.3*  HCT 28.6* 26.6* 25.9* 27.2*  MCV 96.3 96.7 95.2 95.1  PLT 172 134* 120* 124*   Cardiac Enzymes: No results for input(s): CKTOTAL, CKMB, CKMBINDEX, TROPONINI in the last 168 hours. BNP: BNP (last 3 results) Recent Labs     07/11/20 0406  BNP >4,500.0*    ProBNP (last 3 results) No results for input(s): PROBNP in the last 8760 hours.  CBG: Recent Labs  Lab 07/11/20 2044 07/12/20 0604  GLUCAP 82 115*       Signed:  Nita Sells MD   [facility] 07/12/2020, 7:34 PM

## 2020-07-12 NOTE — Progress Notes (Signed)
Pharmacy Antibiotic Note  Jenna [name] is a 65 y.o. female admitted on 07/11/2020 with new onset fever and SOB concerning for pneumonia with history of renal transplant s/p recent hospitalization for CMV viremia. Pharmacy has been consulted for cefepime dosing.  Plan: Cefepime 2g IV every 24 hours Monitor renal function, clinical status, cultures  Height: $Remove'5\' 7"'bgPgHIV$  (170.2 cm) Weight: 95.9 kg (211 lb 6.7 oz) IBW/kg (Calculated) : 61.6  Temp (24hrs), Avg:101 F (38.3 C), Min:100.3 F (37.9 C), Max:101.8 F (38.8 C)  Recent Labs  Lab 07/11/20 0406 07/11/20 1145 07/12/20 0253 07/12/20 1148  WBC 7.3 6.3 6.3 6.2  CREATININE 3.27* 2.67* 3.28* 3.42*    Estimated Creatinine Clearance: 19.8 mL/min (A) (by C-G formula based on SCr of 3.42 mg/dL (H)).    Allergies  Allergen Reactions  . Enalapril Maleate Anaphylaxis, Swelling and Other (See Comments)    Throat swells  . Penicillins Other (See Comments)    Made patient lightheaded PATIENT HAS HAD A PCN REACTION WITH IMMEDIATE RASH, FACIAL/TONGUE/THROAT SWELLING, SOB, OR LIGHTHEADEDNESS WITH HYPOTENSION:  #  #  YES  #  #  Has patient had a PCN reaction causing severe rash involving mucus membranes or skin necrosis: No Has patient had a PCN reaction that required hospitalization: No Has patient had a PCN reaction occurring within the last 10 years: No If all of the above answers are "NO", then may proceed with Cephalosporin use.   . Chocolate Nausea And Vomiting  . Tape Itching, Rash and Other (See Comments)    Romilda Garret, PharmD PGY1 Acute Care Pharmacy Resident 07/12/2020 1:36 PM  Please check [email or web address] for unit specific pharmacy phone numbers.

## 2020-07-12 NOTE — Progress Notes (Signed)
Progress Note  Patient Name: Jenna [name] Date of Encounter: 07/12/2020  [facility] Cardiologist: [facility]  Subjective   Spiked a fever overnight. Primary team has been in touch with [facility] re: transfer. Spoke with her daughter Vita on the phone. Breathing is slightly better but still short. Coughing up yellow/white phlegm.  Inpatient Medications    Scheduled Meds: . heparin  5,000 Units Subcutaneous Q8H  . hydroxychloroquine  200 mg Oral Daily  . predniSONE  5 mg Oral Q breakfast  . rosuvastatin  5 mg Oral q1800  . sodium chloride flush  3 mL Intravenous Q12H  . tacrolimus  0.5 mg Oral Q24H  . tacrolimus  1 mg Oral Q24H  . valGANciclovir  450 mg Oral Q48H   Continuous Infusions: . sodium chloride     PRN Meds: sodium chloride, acetaminophen, ondansetron (ZOFRAN) IV, sodium chloride flush   Vital Signs    Vitals:   07/12/20 0004 07/12/20 0345 07/12/20 0621 07/12/20 0733  BP: 140/74 (!) 159/80  (!) 148/83  Pulse: 97 (!) 103  98  Resp: 18 19  (!) 21  Temp: 100.3 F (37.9 C) (!) 100.9 F (38.3 C)  (!) 100.4 F (38 C)  TempSrc: Oral Oral  Oral  SpO2: 97% 100%  98%  Weight:   95.9 kg   Height:        Intake/Output Summary (Last 24 hours) at 07/12/2020 0838 Last data filed at 07/11/2020 2253 Gross per 24 hour  Intake 200 ml  Output 600 ml  Net -400 ml   Last 3 Weights 07/12/2020 07/11/2020 05/28/2020  Weight (lbs) 211 lb 6.7 oz 207 lb 7.3 oz 198 lb 8 oz  Weight (kg) 95.9 kg 94.1 kg 90.039 kg      Telemetry    Sinus rhythm with occasional sinus tachycardia, no arrhythmia - Personally Reviewed  ECG    07/11/20  Sinus tachycardia at 103 bpm- Personally Reviewed  Physical Exam   GEN: Resting in bed, mildly tachypneic, [place] in place  Neck: No JVD aprpeciated Cardiac: RRR, no murmurs, rubs, or gallops.  Respiratory: Clear to auscultation bilaterally in upper fields, bibasilar rales. GI: Soft, nontender, non-distended  MS: Trace nonpitting LE edema, warm Neuro:   Nonfocal  Psych: Normal affect   Labs    High Sensitivity Troponin:   Recent Labs  Lab 07/11/20 0406 07/11/20 0606 07/11/20 1145 07/11/20 1345  TROPONINIHS 134* 373* 496* 492*      Chemistry Recent Labs  Lab 07/11/20 0406 07/11/20 1145 07/12/20 0253  NA 134*  --  136  K 4.0  --  4.7  CL 103  --  105  CO2 21*  --  21*  GLUCOSE 219*  --  99  BUN 41*  --  45*  CREATININE 3.27* 2.67* 3.28*  CALCIUM 8.4*  --  8.4*  PROT  --   --  6.6  ALBUMIN  --   --  2.5*  2.5*  AST  --   --  32  ALT  --   --  18  ALKPHOS  --   --  49  BILITOT  --   --  1.2  GFRNONAA 15* 19* 15*  ANIONGAP 10  --  10     Hematology Recent Labs  Lab 07/11/20 0406 07/11/20 1145 07/12/20 0253  WBC 7.3 6.3 6.3  RBC 2.97* 2.75* 2.72*  HGB 8.7* 8.0* 8.0*  HCT 28.6* 26.6* 25.9*  MCV 96.3 96.7 95.2  MCH 29.3 29.1 29.4  MCHC 30.4 30.1 30.9  RDW 21.8* 21.9* 22.0*  PLT 172 134* 120*    BNP Recent Labs  Lab 07/11/20 0406  BNP >4,500.0*     DDimer No results for input(s): DDIMER in the last 168 hours.   Radiology    DG Chest Port 1 View  Result Date: 07/11/2020 CLINICAL DATA:  Shortness of breath EXAM: PORTABLE CHEST 1 VIEW COMPARISON:  06/22/2019 FINDINGS: Cardiomegaly, vascular pedicle widening, and diffuse interstitial opacity with small pleural effusions. Cephalized blood flow. Artifact from EKG leads. IMPRESSION: CHF. Electronically Signed   By: Monte Fantasia M.D.   On: 07/11/2020 04:34    Cardiac Studies   2D Echo 06/04/20 SUMMARY  The left ventricular size is normal.  Mild left ventricular hypertrophy  LV ejection fraction = 35-40%.  Left ventricular systolic function is moderately reduced.  Left ventricular filling pattern is prolonged relaxation.  There are regional wall motion abnormalities as specified below.  Mid to distal anterior, anteroseptal thinning and akinesis/dyskinesis  The right ventricle is normal in size and function.  The left atrium is mildly dilated.   There is mild mitral regurgitation.  There is mild tricuspid regurgitation.  No significant stenosis seen  Estimated right ventricular systolic pressure is 28 mmHg.  Estimated right atrial pressure is 5 mmHg.Marland Kitchen  There is no pericardial effusion.   [facility] 10/2018 LHC and coronary angiogram via L CFA (renal allograft on right) for  anterior STEMI (note, R radial accessed with [name] but tiny vessel so aborted  and transitioned to femoral). L CFV accessed and 28F sheath inserted for  central venous access. LM engaged, free of significant. LAD occluded mid  segment. Ramus with mild irregularities. LCx mild irregularities. RCA  engaged, large dominant vessel with moderate disease.Heparin bolus for  goal ACT, loaded with ticagrelor. PTCA of mid LAD with a 2.5 x 12  balloon, PCI using a 2.5 x 18 Onyx DES, post dilated with a 3.0 x 8 Manson to  high pressure. Started on tirofiban bolus/infusion given late sounding  presentation and distal embolization into diagonal branch after  ballooning. AV crossed, LVEDP 35. EBL < 10 cc (plus waste for  ACT/flushes), sheaths (arterial and venous) sutured and dressed, manual  pressure upstairs after aggrastat infusion complete for hemostasis, no  specimens.   Patient Profile     65 y.o. female with very complex history, most prominently s/p renal transplant, lupus nephritis/FSGS, CAD with prior STEMI, history of paroxysmal atrial fibrillation/LAA thrombus, chronic systolic and diastolic heart failure/cardiomyopathy, recent admission with AKI and CMV viremia with complications as noted. She presented with gradually progressive malaise and shortness of breath. Brought to [facility] by EMS, but all of her care is through [facility].   Assessment & Plan    Acute hypoxic respiratory failure  AKI with history of renal transplant  Elevated troponin  Chronic systolic heart failure with cardiomyopathy  -see note 07/11/20. Complicated situation, appears that this is not  entirely cardiac and may be largely driven by renal factors. -appreciated nephrology involvement -with fever overnight, agree with broad culturing and treatment given immunosuppression.  -I continue to recommend transfer to [facility] for continuity of care.  We will follow peripherally, please let us know if acute concerns arise.  For questions or updates, please contact [place] Please consult [email or web address] for contact info under     Signed, Buford Dresser, MD  07/12/2020, 8:38 AM

## 2020-07-12 NOTE — Progress Notes (Signed)
[facility] Progress Note  Name: Jenna [name] MRN: [id number] DOB: May 14, 1955  Chief Complaint:  Shortness of breath   Subjective:  [facility] and they were full on last check.  She was febrile overnight.  She had 600 mL uop over 5/27.  Called her daughter during our exam.  Her mother was short of breath after ambulating to/from restroom and then while eating.  Primary team is at bedside now as well and also worried about her swallow.  Review of systems:  States shortness of breath got better last night No n/v no diarrhea No cp ------ Background on consult:  Jenna [name] is a 65 y.o. 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 fib, TIA, anxiety and depression who presented to the hospital with shortness of breath.  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  Cr peak at 6.6 on 5/8.  Her diuretics were held per pt and then restarted on torsemide.  She was increased from "one pill" to "two pills".  She has been on bipap in ER.  sats 78% with EMS on room air per charting.  No urine output charted.  She's trying to get to her medication list and cannot access the EMR and this frustrates her.  She just saw transplant a couple of days ago and their note states not on bactrim or myfortic (hold per med instructions) though they are are on her medication list   Intake/Output Summary (Last 24 hours) at 07/12/2020 0824 Last data filed at 07/11/2020 2253 Gross per 24 hour  Intake 200 ml  Output 600 ml  Net -400 ml    Vitals:  Vitals:   07/12/20 0004 07/12/20 0345 07/12/20 0621 07/12/20 0733  BP: 140/74 (!) 159/80  (!) 148/83  Pulse: 97 (!) 103  98  Resp: 18 19  (!) 21  Temp: 100.3 F (37.9 C) (!) 100.9 F (38.3  C)  (!) 100.4 F (38 C)  TempSrc: Oral Oral  Oral  SpO2: 97% 100%  98%  Weight:   95.9 kg   Height:         Physical Exam:  General adult female in bed short of breath with eating and after ambulation HEENT normocephalic atraumatic extraocular movements intact sclera anicteric Neck supple trachea midline Lungs basilar crackles; on 2-3 liters oxygen   Heart tachy S1S2 no rub Abdomen soft nontender nondistended Extremities trace edema  Psych normal mood and affect Neuro - alert and oriented x 3 provides hx and follows commands  Medications reviewed   Labs:  BMP Latest Ref Rng & Units 07/12/2020 07/11/2020 07/11/2020  Glucose 70 - 99 mg/dL 99 - 219(H)  BUN 8 - 23 mg/dL 45(H) - 41(H)  Creatinine 0.44 - 1.00 mg/dL 3.28(H) 2.67(H) 3.27(H)  Sodium 135 - 145 mmol/L 136 - 134(L)  Potassium 3.5 - 5.1 mmol/L 4.7 - 4.0  Chloride 98 - 111 mmol/L 105 - 103  CO2 22 - 32 mmol/L 21(L) - 21(L)  Calcium 8.9 - 10.3 mg/dL 8.4(L) - 8.4(L)     Assessment/Plan:   # Fever -would broaden abx to include vanc and cefepime.  I have discussed with primary team - blood and urine cultures are ordered - would consult ID  # AKI  -  recent hx of another AKI event related to prolonged hospitalization with CMV viremia.  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  Would recommend swallow eval   # s/p Renal transplant  - she is on pred 5 mg daily and tacrolimus which she reports is 1 mg in AM and 0.5 mg in the afternoon  - on immunosuppression.  She states not on myfortic and not on bactrim at home  # CMV viremia - recent  prolonged hospitalization at [facility] for the same  - per transplant note she has been on valcyte 450 mg every 48 hours given renal function and is s/p IV gancyclovir 4/26 - 06/30/20.   # Anemia normocytic  - Defer IV iron in setting of concern for infection  Would agree with the recommendation to move the patient to [facility] given her recent complicated history.  We are temporizing her from a volume standpoint.  Team has contacted [facility]  Claudia Desanctis, MD 07/12/2020 9:05 AM

## 2020-07-12 NOTE — Progress Notes (Signed)
Report called to [facility]. She was unaware of which MD is accepting the patient. Reviewed systems, medical history, and present illness. Hand off given to Verdis Frederickson, RN at [facility] who will be with patient until [facility] transfer center returns call with pick up time.

## 2020-07-12 NOTE — Consult Note (Signed)
[facility]  Total days of antibiotics 1       Reason for Consult: pneumonia in immunocompromised host    Referring Physician: samtani  Principal Problem:   Acute on chronic combined systolic and diastolic CHF (congestive heart failure) ([name]) Active Problems:   Lupus (systemic lupus erythematosus) ([name])   Paroxysmal atrial fibrillation ([name])   Acute respiratory failure with hypoxia ([name])   Cytomegalovirus (CMV) viremia ([name])   Elevated troponin   Anemia of chronic disease   Acute kidney injury superimposed on chronic kidney disease ([name])    HPI: Jenna [name] is a 65 y.o. 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 She now presents to [facility] with worsening shortness of breath, hypoxia, CXR concerning for pulmonary edema, requiring BiPAP and diuretics. Overnight having fever up to 100.22F Tmax, concern for aspiration pneumonia per hospitalist. She was started on levofloxacin plus metronidazole, given allergy to penicillin.  Recently hospitalized at [facility] from mid April-mid May for Diarrhea with transaminitis found to have AKI with Cr 6.6 and CMV viremia (> 10 M) treated with IV GCV and also treated for PsA UTI with cefepime ( discharged on ganciclovir 450mg  Q48)with peak Cr 6.6 (BL of 2.58) where diuretic doses changed. She last saw transplant team on 5/24 c/o LE swelling. recs were plan for valcyte x 6 months. Bactrim was on hold due to her leukopenia. Labs at that visit showed WBC 2.8, Cr 2.53. tacro 4.5. CMV VL 2190 with plan to RTC in 1 week.   ROS: Her face sore from wearing bipap last night She is also noticing dry mouth Still having loose stools  She denies choking on food, usually eats slowly Has some productive cough "phlegmy cough" Improved SOB since  admit Lower extremity swelling slightly improved No nausea, abdominal pain. No rash  Past Medical History:  Diagnosis Date  . Anemia   . Anxiety    panic attack- talks herself and takes deep breathes  . CAD (coronary artery disease)    a. STEMI 10/2018 s/p DES to LAD.  11/2018 Depression   . Dyspnea    with exertion   . ESRD (end stage renal disease) ([name])    hemo TTHSAT  . Essential hypertension   . FSGS (focal segmental glomerulosclerosis) 2011   By renal biopsy  . Head injury    age 63  . HFrEF (heart failure with reduced ejection fraction) ([name])   . History of kidney stones    kidney stone  . Hx of lupus nephritis 2011   by renal biopsy  . Ischemic cardiomyopathy   . Kidney transplant recipient   . LA thrombus 10/2018  . Lupus (systemic lupus erythematosus) ([name])    followed by Dr. 11/2018  . Obesity   . Orthostatic hypotension   . PAF (paroxysmal atrial fibrillation) ([name])   . 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 remote parietal infarct on brain MRI 12/2019.  01/2020 Pericarditis    age 1ish  . Renal stone   . Secondary hyperparathyroidism of renal origin ([name])   . SLE (systemic lupus erythematosus) ([name])   . TIA (transient ischemic attack)    no residual effects    Allergies:  Allergies  Allergen Reactions  . Enalapril Maleate Anaphylaxis, Swelling  and Other (See Comments)    Throat swells  . Penicillins Other (See Comments)    Made patient lightheaded PATIENT HAS HAD A PCN REACTION WITH IMMEDIATE RASH, FACIAL/TONGUE/THROAT SWELLING, SOB, OR LIGHTHEADEDNESS WITH HYPOTENSION:  #  #  YES  #  #  Has patient had a PCN reaction causing severe rash involving mucus membranes or skin necrosis: No Has patient had a PCN reaction that required hospitalization: No Has patient had a PCN reaction occurring within the last 10 years: No If all of the above answers are "NO", then may proceed with Cephalosporin use.   . Chocolate Nausea And Vomiting  . Tape Itching, Rash and Other  (See Comments)    MEDICATIONS: . heparin  5,000 Units Subcutaneous Q8H  . hydroxychloroquine  200 mg Oral Daily  . predniSONE  5 mg Oral Q breakfast  . rosuvastatin  5 mg Oral q1800  . sodium chloride flush  3 mL Intravenous Q12H  . tacrolimus  0.5 mg Oral Q24H  . tacrolimus  1 mg Oral Q24H  . valGANciclovir  450 mg Oral Q48H    Social History   Tobacco Use  . Smoking status: Never Smoker  . Smokeless tobacco: Never Used  Vaping Use  . Vaping Use: Never used  Substance Use Topics  . Alcohol use: No  . Drug use: No    Family History  Problem Relation Age of Onset  . Diabetes Mother   . Hypertension Father   . Cancer Sister   . Hypertension Brother     OBJECTIVE: Temp:  [100.3 F (37.9 C)-101.4 F (38.6 C)] 100.4 F (38 C) (05/28 0733) Pulse Rate:  [97-105] 98 (05/28 0733) Resp:  [18-24] 21 (05/28 0733) BP: (140-169)/(74-96) 148/83 (05/28 0733) SpO2:  [97 %-100 %] 98 % (05/28 0733) Weight:  [94.1 kg-95.9 kg] 95.9 kg (05/28 2878) Physical Exam  Constitutional:  oriented to person, place, and time. appears well-developed and well-nourished. No distress.  HENT: /AT, PERRLA, no scleral icterus Mouth/Throat: Oropharynx is clear and moist. No oropharyngeal exudate.  Cardiovascular: Normal rate, regular rhythm and normal heart sounds. Exam reveals no gallop and no friction rub.  No murmur heard.  Pulmonary/Chest: Effort normal and breath sounds normal. No respiratory distress.  has no wheezes.  Neck = supple, no nuchal rigidity Abdominal: Soft. Bowel sounds are normal.  exhibits no distension. There is no tenderness.  Lymphadenopathy: no cervical adenopathy. No axillary adenopathy Neurological: alert and oriented to person, place, and time.  Skin: Skin is warm and dry. No rash noted. No erythema.  Psychiatric: a normal mood and affect.  behavior is normal.   LABS: Results for orders placed or performed during the hospital encounter of 07/11/20 (from the past 48  hour(s))  Basic metabolic panel     Status: Abnormal   Collection Time: 07/11/20  4:06 AM  Result Value Ref Range   Sodium 134 (L) 135 - 145 mmol/L   Potassium 4.0 3.5 - 5.1 mmol/L   Chloride 103 98 - 111 mmol/L   CO2 21 (L) 22 - 32 mmol/L   Glucose, Bld 219 (H) 70 - 99 mg/dL    Comment: Glucose reference range applies only to samples taken after fasting for at least 8 hours.   BUN 41 (H) 8 - 23 mg/dL   Creatinine, Ser 3.27 (H) 0.44 - 1.00 mg/dL   Calcium 8.4 (L) 8.9 - 10.3 mg/dL   GFR, Estimated 15 (L) >60 mL/min    Comment: (NOTE) Calculated using the  CKD-EPI Creatinine Equation (2021)    Anion gap 10 5 - 15    Comment: Performed at [facility], [place] [address], [place], [place] [address]  Brain natriuretic peptide     Status: Abnormal   Collection Time: 07/11/20  4:06 AM  Result Value Ref Range   B Natriuretic Peptide >4,500.0 (H) 0.0 - 100.0 pg/mL    Comment: Performed at [facility] [address] [place], [place] [address]  Troponin I (High Sensitivity)     Status: Abnormal   Collection Time: 07/11/20  4:06 AM  Result Value Ref Range   Troponin I (High Sensitivity) 134 (HH) <18 ng/L    Comment: CRITICAL RESULT CALLED TO, READ BACK BY AND VERIFIED WITH: D. HARRIS, Caban 07/11/20 A. MCDOWELL (NOTE) Elevated high sensitivity troponin I (hsTnI) values and significant  changes across serial measurements may suggest ACS but many other  chronic and acute conditions are known to elevate hsTnI results.  Refer to the Links section for chest pain algorithms and additional  guidance. Performed at [facility], [place] [address], [place], [place] [address]   CBC with Differential     Status: Abnormal   Collection Time: 07/11/20  4:06 AM  Result Value Ref Range   WBC 7.3 4.0 - 10.5 K/uL   RBC 2.97 (L) 3.87 - 5.11 MIL/uL   Hemoglobin 8.7 (L) 12.0 - 15.0 g/dL   HCT 28.6 (L) 36.0 - 46.0 %   MCV 96.3 80.0 - 100.0 fL   MCH 29.3 26.0 - 34.0 pg   MCHC 30.4 30.0 -  36.0 g/dL   RDW 21.8 (H) 11.5 - 15.5 %   Platelets 172 150 - 400 K/uL   nRBC 0.3 (H) 0.0 - 0.2 %   Neutrophils Relative % 74 %   Neutro Abs 5.5 1.7 - 7.7 K/uL   Lymphocytes Relative 14 %   Lymphs Abs 1.0 0.7 - 4.0 K/uL   Monocytes Relative 10 %   Monocytes Absolute 0.7 0.1 - 1.0 K/uL   Eosinophils Relative 1 %   Eosinophils Absolute 0.1 0.0 - 0.5 K/uL   Basophils Relative 0 %   Basophils Absolute 0.0 0.0 - 0.1 K/uL   Immature Granulocytes 1 %   Abs Immature Granulocytes 0.07 0.00 - 0.07 K/uL    Comment: Performed at [facility], [address], [place],  [address]  Resp Panel by RT-PCR (Flu A&B, Covid) Nasopharyngeal Swab     Status: None   Collection Time: 07/11/20  4:07 AM   Specimen: Nasopharyngeal Swab; Nasopharyngeal(NP) swabs in vial transport medium  Result Value Ref Range   SARS Coronavirus 2 by RT PCR NEGATIVE NEGATIVE    Comment: (NOTE) SARS-CoV-2 target nucleic acids are NOT DETECTED.  The SARS-CoV-2 RNA is generally detectable in upper respiratory specimens during the acute phase of infection. The lowest concentration of SARS-CoV-2 viral copies this assay can detect is 138 copies/mL. A negative result does not preclude SARS-Cov-2 infection and should not be used as the sole basis for treatment or other patient management decisions. A negative result may occur with  improper specimen collection/handling, submission of specimen other than nasopharyngeal swab, presence of viral mutation(s) within the areas targeted by this assay, and inadequate number of viral copies(<138 copies/mL). A negative result must be combined with clinical observations, patient history, and epidemiological information. The expected result is Negative.  Fact Sheet for Patients:  EntrepreneurPulse.com.au  Fact Sheet for Healthcare Providers:  IncredibleEmployment.be  This test is no t yet approved or cleared by the [place] and  has  been authorized for detection and/or diagnosis of SARS-CoV-2 by FDA under an Emergency Use Authorization (EUA). This EUA will remain  in effect (meaning this test can be used) for the duration of the COVID-19 declaration under Section 564(b)(1) of the Act, 21 U.S.C.section 360bbb-3(b)(1), unless the authorization is terminated  or revoked sooner.       Influenza A by PCR NEGATIVE NEGATIVE   Influenza B by PCR NEGATIVE NEGATIVE    Comment: (NOTE) The Xpert Xpress SARS-CoV-2/FLU/RSV plus assay is intended as an aid in the diagnosis of influenza from Nasopharyngeal swab specimens and should not be used as a sole basis for treatment. Nasal washings and aspirates are unacceptable for Xpert Xpress SARS-CoV-2/FLU/RSV testing.  Fact Sheet for Patients: EntrepreneurPulse.com.au  Fact Sheet for Healthcare Providers: IncredibleEmployment.be  This test is not yet approved or cleared by the [place] FDA and has been authorized for detection and/or diagnosis of SARS-CoV-2 by FDA under an Emergency Use Authorization (EUA). This EUA will remain in effect (meaning this test can be used) for the duration of the COVID-19 declaration under Section 564(b)(1) of the Act, 21 U.S.C. section 360bbb-3(b)(1), unless the authorization is terminated or revoked.  Performed at [facility], [place] [address], [place], [place] [address]   Troponin I (High Sensitivity)     Status: Abnormal   Collection Time: 07/11/20  6:06 AM  Result Value Ref Range   Troponin I (High Sensitivity) 373 (HH) <18 ng/L    Comment: CRITICAL VALUE NOTED.  VALUE IS CONSISTENT WITH PREVIOUSLY REPORTED AND CALLED VALUE. (NOTE) Elevated high sensitivity troponin I (hsTnI) values and significant  changes across serial measurements may suggest ACS but many other  chronic and acute conditions are known to elevate hsTnI results.  Refer to the Links section for chest pain algorithms and  additional  guidance. Performed at [facility], [place] [address] [place], [place] [address]   HIV Antibody (routine testing w rflx)     Status: None   Collection Time: 07/11/20  8:24 AM  Result Value Ref Range   HIV Screen 4th Generation wRfx Non Reactive Non Reactive    Comment: Performed at [facility], [place] [address], [place], [place] [address]  CBC     Status: Abnormal   Collection Time: 07/11/20 11:45 AM  Result Value Ref Range   WBC 6.3 4.0 - 10.5 K/uL   RBC 2.75 (L) 3.87 - 5.11 MIL/uL   Hemoglobin 8.0 (L) 12.0 - 15.0 g/dL   HCT 26.6 (L) 36.0 - 46.0 %   MCV 96.7 80.0 - 100.0 fL   MCH 29.1 26.0 - 34.0 pg   MCHC 30.1 30.0 - 36.0 g/dL   RDW 21.9 (H) 11.5 - 15.5 %   Platelets 134 (L) 150 - 400 K/uL   nRBC 0.0 0.0 - 0.2 %    Comment: Performed at [facility], [place] [address] [place], [place] [address]  Creatinine, serum     Status: Abnormal   Collection Time: 07/11/20 11:45 AM  Result Value Ref Range   Creatinine, Ser 2.67 (H) 0.44 - 1.00 mg/dL   GFR, Estimated 19 (L) >60 mL/min    Comment: (NOTE) Calculated using the CKD-EPI Creatinine Equation (2021) Performed at [facility] [address], [place], [place] [address]   Troponin I (High Sensitivity)     Status: Abnormal  Collection Time: 07/11/20 11:45 AM  Result Value Ref Range   Troponin I (High Sensitivity) 496 (HH) <18 ng/L    Comment: CRITICAL VALUE NOTED.  VALUE IS CONSISTENT WITH PREVIOUSLY REPORTED AND CALLED VALUE. (NOTE) Elevated high sensitivity troponin I (hsTnI) values and significant  changes across serial measurements may suggest ACS but many other  chronic and acute conditions are known to elevate hsTnI results.  Refer to the Links section for chest pain algorithms and additional  guidance. Performed at [facility], [address] [place], [place] [address]   Troponin I (High Sensitivity)     Status: Abnormal   Collection Time: 07/11/20  1:45 PM  Result Value Ref  Range   Troponin I (High Sensitivity) 492 (HH) <18 ng/L    Comment: CRITICAL VALUE NOTED.  VALUE IS CONSISTENT WITH PREVIOUSLY REPORTED AND CALLED VALUE. (NOTE) Elevated high sensitivity troponin I (hsTnI) values and significant  changes across serial measurements may suggest ACS but many other  chronic and acute conditions are known to elevate hsTnI results.  Refer to the Links section for chest pain algorithms and additional  guidance. Performed at [facility], [address] [place], [place] [address]   Hemoglobin A1c     Status: None   Collection Time: 07/11/20  5:42 PM  Result Value Ref Range   Hgb A1c MFr Bld 5.3 4.8 - 5.6 %    Comment: (NOTE)         Prediabetes: 5.7 - 6.4         Diabetes: >6.4         Glycemic control for adults with diabetes: <7.0    Mean Plasma Glucose 105 mg/dL    Comment: (NOTE) Performed At: [facility] [address] [place], [place] [id number] Jolene Schimke MD NX:[id number]   Glucose, capillary     Status: None   Collection Time: 07/11/20  8:44 PM  Result Value Ref Range   Glucose-Capillary 82 70 - 99 mg/dL    Comment: Glucose reference range applies only to samples taken after fasting for at least 8 hours.  CBC WITH DIFFERENTIAL     Status: Abnormal   Collection Time: 07/12/20  2:53 AM  Result Value Ref Range   WBC 6.3 4.0 - 10.5 K/uL   RBC 2.72 (L) 3.87 - 5.11 MIL/uL   Hemoglobin 8.0 (L) 12.0 - 15.0 g/dL   HCT 05.6 (L) 54.3 - 39.4 %   MCV 95.2 80.0 - 100.0 fL   MCH 29.4 26.0 - 34.0 pg   MCHC 30.9 30.0 - 36.0 g/dL   RDW 79.8 (H) 48.2 - 14.7 %   Platelets 120 (L) 150 - 400 K/uL   nRBC 0.0 0.0 - 0.2 %   Neutrophils Relative % 78 %   Neutro Abs 4.9 1.7 - 7.7 K/uL   Lymphocytes Relative 5 %   Lymphs Abs 0.3 (L) 0.7 - 4.0 K/uL   Monocytes Relative 16 %   Monocytes Absolute 1.0 0.1 - 1.0 K/uL   Eosinophils Relative 0 %   Eosinophils Absolute 0.0 0.0 - 0.5 K/uL   Basophils Relative 0 %   Basophils Absolute 0.0 0.0 -  0.1 K/uL   RBC Morphology MORPHOLOGY UNREMARKABLE    Immature Granulocytes 1 %   Abs Immature Granulocytes 0.06 0.00 - 0.07 K/uL    Comment: Performed at [facility], [address] [place], [place] [address]  Renal function panel     Status: Abnormal  Collection Time: 07/12/20  2:53 AM  Result Value Ref Range   Sodium 136 135 - 145 mmol/L   Potassium 4.7 3.5 - 5.1 mmol/L   Chloride 105 98 - 111 mmol/L   CO2 21 (L) 22 - 32 mmol/L   Glucose, Bld 99 70 - 99 mg/dL    Comment: Glucose reference range applies only to samples taken after fasting for at least 8 hours.   BUN 45 (H) 8 - 23 mg/dL   Creatinine, Ser 3.28 (H) 0.44 - 1.00 mg/dL   Calcium 8.4 (L) 8.9 - 10.3 mg/dL   Phosphorus 4.2 2.5 - 4.6 mg/dL   Albumin 2.5 (L) 3.5 - 5.0 g/dL   GFR, Estimated 15 (L) >60 mL/min    Comment: (NOTE) Calculated using the CKD-EPI Creatinine Equation (2021)    Anion gap 10 5 - 15    Comment: Performed at [facility] [address] [place], [place] [address]  Hepatic function panel     Status: Abnormal   Collection Time: 07/12/20  2:53 AM  Result Value Ref Range   Total Protein 6.6 6.5 - 8.1 g/dL   Albumin 2.5 (L) 3.5 - 5.0 g/dL   AST 32 15 - 41 U/L   ALT 18 0 - 44 U/L   Alkaline Phosphatase 49 38 - 126 U/L   Total Bilirubin 1.2 0.3 - 1.2 mg/dL   Bilirubin, Direct 0.2 0.0 - 0.2 mg/dL   Indirect Bilirubin 1.0 (H) 0.3 - 0.9 mg/dL    Comment: Performed at [facility] [address] [place], [place] [address]  Type and screen     Status: None   Collection Time: 07/12/20  2:53 AM  Result Value Ref Range   ABO/RH(D) A POS    Antibody Screen NEG    Sample Expiration      07/15/2020,2359 Performed at [facility], [place] [address] [place], [place] [address]   Glucose, capillary     Status: Abnormal   Collection Time: 07/12/20  6:04 AM  Result Value Ref Range   Glucose-Capillary 115 (H) 70 - 99 mg/dL    Comment: Glucose reference range applies only to samples taken  after fasting for at least 8 hours.   Comment 1 Notify RN    Comment 2 Document in Chart   Protein / creatinine ratio, urine     Status: None (Preliminary result)   Collection Time: 07/12/20 10:37 AM  Result Value Ref Range   Creatinine, Urine 141.51 mg/dL    Comment: Performed at [facility], [place] [address], [place], [place] [address]   Total Protein, Urine PENDING mg/dL   Protein Creatinine Ratio PENDING 0.00 - 0.15 mg/mg[Cre]  Urinalysis, Complete w Microscopic Urine, Catheterized     Status: Abnormal   Collection Time: 07/12/20 10:37 AM  Result Value Ref Range   Color, Urine YELLOW YELLOW   APPearance CLOUDY (A) CLEAR   Specific Gravity, Urine 1.013 1.005 - 1.030   pH 5.0 5.0 - 8.0   Glucose, UA NEGATIVE NEGATIVE mg/dL   Hgb urine dipstick SMALL (A) NEGATIVE   Bilirubin Urine NEGATIVE NEGATIVE   Ketones, ur NEGATIVE NEGATIVE mg/dL   Protein, ur >=300 (A) NEGATIVE mg/dL   Nitrite NEGATIVE NEGATIVE   Leukocytes,Ua LARGE (A) NEGATIVE   RBC / HPF 21-50 0 - 5 RBC/hpf   WBC, UA >50 (H) 0 - 5 WBC/hpf   Bacteria, UA RARE (A) NONE SEEN   Squamous Epithelial / LPF 0-5  0 - 5   WBC Clumps PRESENT    Mucus PRESENT     Comment: Performed at [facility], [place] [address], [place], [place] [address]    MICRO: Blood cx = NGTD but drawn on 5/28 IMAGING: DG CHEST PORT 1 VIEW  Result Date: 07/12/2020 CLINICAL DATA:  Follow-up pneumonia. EXAM: PORTABLE CHEST 1 VIEW COMPARISON:  07/11/2020 and older studies. FINDINGS: Cardiac silhouette remains enlarged. Interstitial opacities have improved. Opacity at the right lung base has increased, consistent with an increase in pleural fluid and probable atelectasis. Pneumonia is possible. Opacity at the left lung base is stable also consistent with atelectasis or pneumonia. No pneumothorax. IMPRESSION: 1. Improved interstitial thickening from the previous day's exam suggests improved interstitial pulmonary edema/CHF. 2. Increase in opacity at  the right lung base, suspected to be a combination of increased pleural fluid with either atelectasis or pneumonia. Persistent left lung base opacity consistent with pleural fluid with either atelectasis or pneumonia. Electronically Signed   By: Lajean Manes M.D.   On: 07/12/2020 09:21   DG Chest Port 1 View  Result Date: 07/11/2020 CLINICAL DATA:  Shortness of breath EXAM: PORTABLE CHEST 1 VIEW COMPARISON:  06/22/2019 FINDINGS: Cardiomegaly, vascular pedicle widening, and diffuse interstitial opacity with small pleural effusions. Cephalized blood flow. Artifact from EKG leads. IMPRESSION: CHF. Electronically Signed   By: Monte Fantasia M.D.   On: 07/11/2020 04:34   Assessment/Plan: 65yo F immunocompromised host with DDKT , CMV D+/R-, being treated for CMV viremia admitted for worsening shortness of breath concern for pulmonary edema/acute exacerbation of sCHF plus new onset fever, concerning for pneumonia however her sequential cxr from 5/27 and 5/28 show worsening right sided effusion.   - given patient's report, I feel aspiration less likely though she is at risk for Hospital Associated PNA given recent hospitalization. Will check for MRSA colonization. Agree with changing abtx to cefepime (Which she tolerates) and linezolid. - will get sputum culture - will check PJP stain, though her clinical picture seems still c/w CHF and possible HAP (only off of oi proph 10-14 days)  - check MRSA colonization to see if MRSA coverage is warranted - will follow blood cx  CHF = continue with diuresis per renal/cardiology. Appears improving now only on 2-3L.   Diarrhea = appears new. If ongoing or worsening abdominal pain, consider check for cdifficile since recent abtx exposure at prior hospitalization.   Renal transplant = continue on current IS but also would recommend restarting oi proph of bactrim   [facility] [phone]

## 2020-07-15 LAB — ABO/RH: ABO/RH(D): A POS

## 2020-07-17 LAB — CULTURE, BLOOD (ROUTINE X 2)
Culture: NO GROWTH
Culture: NO GROWTH
Special Requests: ADEQUATE
Special Requests: ADEQUATE

## 2020-07-25 ENCOUNTER — Inpatient Hospital Stay: Payer: Medicare Other | Admitting: Family Medicine

## 2020-07-28 ENCOUNTER — Other Ambulatory Visit: Payer: Self-pay | Admitting: Family Medicine

## 2020-07-28 ENCOUNTER — Telehealth: Payer: Self-pay | Admitting: Family Medicine

## 2020-07-28 NOTE — Telephone Encounter (Signed)
I left Mickel Baas a voicemail letting her know the Rx has been faxed and the verbal orders are approved.

## 2020-07-28 NOTE — Telephone Encounter (Signed)
Mickel Baas from [facility] call and stated she need orders for PT 1 WK 1/2 WK'S 2/1 [place] 3 she also stated she need a strip for Rollator and a shower bench fax to her at [phone].Marland KitchenMickel Baas # is [phone]

## 2020-07-29 ENCOUNTER — Other Ambulatory Visit: Payer: Self-pay

## 2020-07-30 ENCOUNTER — Encounter: Payer: Self-pay | Admitting: Family Medicine

## 2020-07-30 ENCOUNTER — Telehealth: Payer: Self-pay

## 2020-07-30 ENCOUNTER — Ambulatory Visit (INDEPENDENT_AMBULATORY_CARE_PROVIDER_SITE_OTHER): Payer: Medicare Other | Admitting: Family Medicine

## 2020-07-30 VITALS — BP 110/60 | HR 64 | Temp 98.2°F | Resp 18 | Ht 67.0 in | Wt 196.6 lb

## 2020-07-30 DIAGNOSIS — I5020 Unspecified systolic (congestive) heart failure: Secondary | ICD-10-CM | POA: Diagnosis not present

## 2020-07-30 DIAGNOSIS — B259 Cytomegaloviral disease, unspecified: Secondary | ICD-10-CM

## 2020-07-30 DIAGNOSIS — E876 Hypokalemia: Secondary | ICD-10-CM

## 2020-07-30 DIAGNOSIS — I2510 Atherosclerotic heart disease of native coronary artery without angina pectoris: Secondary | ICD-10-CM

## 2020-07-30 DIAGNOSIS — I129 Hypertensive chronic kidney disease with stage 1 through stage 4 chronic kidney disease, or unspecified chronic kidney disease: Secondary | ICD-10-CM

## 2020-07-30 DIAGNOSIS — T23222A Burn of second degree of single left finger (nail) except thumb, initial encounter: Secondary | ICD-10-CM

## 2020-07-30 DIAGNOSIS — R059 Cough, unspecified: Secondary | ICD-10-CM

## 2020-07-30 DIAGNOSIS — F419 Anxiety disorder, unspecified: Secondary | ICD-10-CM

## 2020-07-30 DIAGNOSIS — I480 Paroxysmal atrial fibrillation: Secondary | ICD-10-CM

## 2020-07-30 DIAGNOSIS — N185 Chronic kidney disease, stage 5: Secondary | ICD-10-CM

## 2020-07-30 LAB — CBC
HCT: 25 % — ABNORMAL LOW (ref 36.0–46.0)
Hemoglobin: 8.3 g/dL — ABNORMAL LOW (ref 12.0–15.0)
MCHC: 33.3 g/dL (ref 30.0–36.0)
MCV: 93.7 fl (ref 78.0–100.0)
Platelets: 261 10*3/uL (ref 150.0–400.0)
RBC: 2.67 Mil/uL — ABNORMAL LOW (ref 3.87–5.11)
RDW: 23 % — ABNORMAL HIGH (ref 11.5–15.5)
WBC: 3.2 10*3/uL — ABNORMAL LOW (ref 4.0–10.5)

## 2020-07-30 LAB — BASIC METABOLIC PANEL
BUN: 96 mg/dL (ref 6–23)
CO2: 25 mEq/L (ref 19–32)
Calcium: 9 mg/dL (ref 8.4–10.5)
Chloride: 95 mEq/L — ABNORMAL LOW (ref 96–112)
Creatinine, Ser: 3.42 mg/dL — ABNORMAL HIGH (ref 0.40–1.20)
GFR: 13.58 mL/min — CL (ref >60.00)
Glucose, Bld: 84 mg/dL (ref 70–99)
Potassium: 3.3 mEq/L — ABNORMAL LOW (ref 3.5–5.1)
Sodium: 133 mEq/L — ABNORMAL LOW (ref 135–145)

## 2020-07-30 MED ORDER — POTASSIUM CHLORIDE CRYS ER 20 MEQ PO TBCR: 20.0000 meq | EXTENDED_RELEASE_TABLET | Freq: Every day | ORAL | 0 refills | Status: DC

## 2020-07-30 MED ORDER — SILVER SULFADIAZINE 1 % EX CREA: 1.0000 "application " | TOPICAL_CREAM | Freq: Every day | CUTANEOUS | 0 refills | Status: DC

## 2020-07-30 NOTE — Telephone Encounter (Signed)
CRITICAL VALUE STICKER  CRITICAL VALUE: BUN: 96, GFR: 13.58  RECEIVER (on-site recipient of call): lab tech here in our office notified me.  DATE & TIME NOTIFIED: 07/30/20, 4:15 PM  MD NOTIFIED: Betty [name], MD  TIME OF NOTIFICATION: 4:20 PM  RESPONSE: She would take a look.

## 2020-07-30 NOTE — Patient Instructions (Addendum)
A few things to remember from today's visit:  Hypertension with renal disease - Plan: Basic metabolic panel, CBC  Anxiety disorder, unspecified type  HFrEF (heart failure with reduced ejection fraction) ([place]) - Plan: Basic metabolic panel, CBC  Burn second degree of single l finger except thumb, init - Plan: silver sulfADIAZINE (SILVADENE) 1 % cream  If you need refills please call your pharmacy. Do not use My Chart to request refills or for acute issues that need immediate attention.   Continue PT at home. Fall precautions. Keep appt with psychiatrist. Please call your cardiologist's office. Do not burst blister.  Monitor blood pressure at home. For now no changes in Metoprolol.  Please be sure medication list is accurate. If a new problem present, please set up appointment sooner than planned today.

## 2020-07-30 NOTE — Telephone Encounter (Signed)
Last e GFR was 16, Cr is stable. K+ mildly low. I am going to send KLOR 20 meq to take daily for 3-4 days then continue with K+ rich diet. Potassium needs to be re-checked, if she has an appt with nephrologist (she told me she is supposed to have weekly labs) ,it can be done at his office in 2 weeks. Please fax results to her nephrologist. Thanks, BJ

## 2020-07-30 NOTE — Progress Notes (Signed)
KOR    Chief Complaint  Patient presents with   Hospitalization Follow-up    Admitted on 07/13/20: Acute AKI, Acute Respiratory failure, Acute on chronic Heart failure. Was advised on discharge to d/c Lasix and Spironolactone. Pts will be getting a scale to weigh daily. Pt asks if she should take Valcyte 450 mg daily- before the hospital she took it every other day. Then when admitted it was daily. She also says her skin is shedding.    HPI:  Jenna [name] is a 65 y.o. female, who is here today to follow on recent hospitalization as described above.  She was hospitalized from 07/13/20 to 07/22/20. Initially she presented to the ED because SOB and worsening LE edema. She was then transferred to hospitalist service at [facility] on 07/12/2020.  Acute on chronic HFrEF: Furosemide and spironolactone were discontinued.  Currently she is on torsemide 40 mg daily and metoprolol succinate 25 mg 1/2 tablet daily. During hospitalization BP's 140's/70's. She is not checking BP at home.  TTE showed LVEF 20 to 25% (06/04/20 LVEF was 35-40%) Negative for orthopnea and PND. Because of hypotension Valsartan was discontinued. BNP was 3108 on 07/22/2020.  Still having lightheadedness with rapid movement/getting up. Had a fall at home when trying to reach for her tablet in the bathroom.  No serious injury. She has a cane and a walker at home.  Atrial fibrillation: She is not on anticoagulation. She was on Eliquis in the past, she is not sure why medication was discontinued. Negative for history of blood in the stool, melena, or gross hematuria. Easy bruising, which she attributes to lupus.  Her cardiologist is leaving the practice, so she is going to establish with new cardiologist.  She denies CP, dyspnea, palpitation, worsening edema. She is not waking herself at home, waiting for a new scale.  AKI on CKD s/p renal transplant: Tacrolimus was increased from 0.5 mg twice daily to 2.5 mg a.m. and  1 mg p.m.   Tacrolimus level < 3.0 on 07/22/20. Goal 4-5.  CMV viremia Dx'ed in 05/2020. She is on Valcyte 450 mg increased from every other day to daily dose. Quantitative PCR-CMV on 07/22/2020 was 641 (1,150 on 07/13/20). She is not sure about her next appointment with nephrologist.    Ref Range & Units 8 d ago Comments  Sodium 135 - 146 MMOL/L 134 Low     Potassium 3.5 - 5.3 MMOL/L 3.6  NO VISIBLE HEMOLYSIS  Chloride 98 - 110 MMOL/L 96 Low     CO2 21 - 31 MMOL/L 27    BUN 8 - 24 MG/DL 72 High     Glucose 70 - 99 MG/DL 72  Patients taking eltrombopag at doses >/= 100 mg daily may show falsely elevated values of 10% or greater.  Creatinine 0.60 - 1.20 MG/DL 3.20 High     Calcium 8.5 - 10.5 MG/DL 7.9 Low     Anion Gap 4 - 14 MMOL/L 11    Est. GFR >=60 ML/MIN/1.73 M*2  ML/MIN/1.73 M*2 16 Low       Ref Range & Units 8 d ago  WBC 4.4 - 11.0 x 10*3/uL 3.6 Low    RBC 4.10 - 5.10 x 10*6/uL 2.65 Low    Hemoglobin 12.3 - 15.3 G/DL 8.0 Low    Hematocrit 35.9 - 44.6 % 24.0 Low    MCV 80.0 - 96.0 FL 90.5   MCH 27.5 - 33.2 PG 30.3   MCHC 33.0 - 37.0 G/DL 33.5  RDW % 23.3   Platelets 150 - 450 X 10*3/uL 136 Low    CXR on 07/19/20:  1.  Similar cardiomegaly and interstitial edema.  2.  Consolidative opacities in the left lower thorax may be a combination of edema/atelectasis and underlying pleural effusion. Superimposed infection wouldbe difficult to exclude.  She has had mild cough, attributed to allergies. Negative for associated wheezing.  Anxiety: She has not seen psychiatrist and she has appt on 08/04/20. She is not longer on Lexapro 5 mg . She is on BusPar 5 mg bid. She is not sure if medication is helping.  She feels like she needs more "compassion." Her brother is staying with her. PT evaluation was yesterday, pending Ortho evaluation. ADL aid assistance was denied by her insurance, up set about this. We have completed forms and submitted to her insurance company.    She has a  blister on right 5th finger, she is trying to burst. She burned 4th and 5th right finger while cooking a few days ago.4th finger lesion has healed and 5th finger is getting better.  Review of Systems  Constitutional:  Positive for activity change, appetite change and fatigue. Negative for fever.  HENT:  Positive for postnasal drip. Negative for mouth sores, nosebleeds and sore throat.   Eyes:  Negative for redness and visual disturbance.  Respiratory:  Negative for chest tightness and stridor.   Gastrointestinal:  Negative for abdominal pain, nausea and vomiting.       Negative for changes in bowel habits.  Genitourinary:  Negative for decreased urine volume and dysuria.  Musculoskeletal:  Positive for gait problem.  Skin:  Negative for rash and wound.  Allergic/Immunologic: Positive for environmental allergies.  Neurological:  Negative for syncope, weakness and headaches.  Psychiatric/Behavioral:  Negative for confusion. The patient is nervous/anxious.   Rest see pertinent positives and negatives per HPI.  Current Outpatient Medications on File Prior to Visit  Medication Sig Dispense Refill   acetaminophen (TYLENOL) 500 MG tablet Take 1,000 mg by mouth every 6 (six) hours as needed for headache.     aspirin 81 MG EC tablet Take 81 mg by mouth every morning.     busPIRone (BUSPAR) 5 MG tablet Take 5 mg by mouth 2 (two) times daily.     ceFEPIme 2 g in sodium chloride 0.9 % 100 mL Inject 2 g into the vein daily.     gabapentin (NEURONTIN) 100 MG capsule Take 1 capsule (100 mg total) by mouth at bedtime. 30 capsule 2   hydroxychloroquine (PLAQUENIL) 200 MG tablet Take 1 tablet (200 mg total) by mouth 2 (two) times daily. (Patient taking differently: Take 200 mg by mouth daily.) 180 tablet 3   linezolid (ZYVOX) 600 MG tablet Take 1 tablet (600 mg total) by mouth every 12 (twelve) hours.     metoprolol succinate (TOPROL-XL) 25 MG 24 hr tablet Take 0.5 tablets (12.5 mg total) by mouth daily.      nitroGLYCERIN (NITROSTAT) 0.4 MG SL tablet Place 0.4 mg under the tongue every 5 (five) minutes as needed for chest pain.      predniSONE (DELTASONE) 5 MG tablet Take 5 mg by mouth daily with lunch.     rosuvastatin (CRESTOR) 5 MG tablet Take 5 mg by mouth daily at 6 PM.     tacrolimus (PROGRAF) 0.5 MG capsule Take 0.5-1 mg by mouth See admin instructions. Take 2 capsules (1 mg) by mouth every morning and 1 capsule (0.5 mg) at night  valGANciclovir (VALCYTE) 450 MG tablet Take 450 mg by mouth every morning.     valGANciclovir (VALCYTE) 450 MG tablet Take 450 mg by mouth daily.     No current facility-administered medications on file prior to visit.   Past Medical History:  Diagnosis Date   Anemia    Anxiety    panic attack- talks herself and takes deep breathes   CAD (coronary artery disease)    a. STEMI 10/2018 s/p DES to LAD.   Depression    Dyspnea    with exertion    ESRD (end stage renal disease) ([place])    hemo TTHSAT   Essential hypertension    FSGS (focal segmental glomerulosclerosis) 2011   By renal biopsy   Head injury    age 38   HFrEF (heart failure with reduced ejection fraction) ([place])    History of kidney stones    kidney stone   Hx of lupus nephritis 2011   by renal biopsy   Ischemic cardiomyopathy    Kidney transplant recipient    LA thrombus 10/2018   Lupus (systemic lupus erythematosus) ([place])    followed by Dr. 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 remote parietal infarct on brain MRI 12/2019.   Pericarditis    age 70ish   Renal stone    Secondary hyperparathyroidism of renal origin ([place])    SLE (systemic lupus erythematosus) ([name])    TIA (transient ischemic attack)    no residual effects   Allergies  Allergen Reactions   Enalapril Maleate Anaphylaxis, Swelling and Other (See Comments)    Throat swells   Penicillins Other (See Comments)    Made  patient lightheaded PATIENT HAS HAD A PCN REACTION WITH IMMEDIATE RASH, FACIAL/TONGUE/THROAT SWELLING, SOB, OR LIGHTHEADEDNESS WITH HYPOTENSION:  #  #  YES  #  #  Has patient had a PCN reaction causing severe rash involving mucus membranes or skin necrosis: No Has patient had a PCN reaction that required hospitalization: No Has patient had a PCN reaction occurring within the last 10 years: No If all of the above answers are "NO", then may proceed with Cephalosporin use.    Chocolate Nausea And Vomiting   Tape Itching, Rash and Other (See Comments)    Social History   Socioeconomic History   Marital status: Legally Separated    Spouse name: Not on file   Number of children: 1   Years of education: Not on file   Highest education level: Not on file  Occupational History   Not on file  Tobacco Use   Smoking status: Never   Smokeless tobacco: Never  Vaping Use   Vaping Use: Never used  Substance and Sexual Activity   Alcohol use: No   Drug use: No   Sexual activity: Not Currently  Other Topics Concern   Not on file  Social History Narrative   Right Handed   Lives in a two story home   Daughter recently got married    Social Determinants of Health   Financial Resource Strain: Low Risk    Difficulty of Paying Living Expenses: Not very hard  Food Insecurity: No Food Insecurity   Worried About Charity fundraiser in the Last Year: Never true   [place] in the Last Year: Never true  Transportation Needs: No Transportation Needs   Lack of Transportation (Medical):  No   Lack of Transportation (Non-Medical): No  Physical Activity: Sufficiently Active   Days of Exercise per Week: 5 days   Minutes of Exercise per Session: 30 min  Stress: Stress Concern Present   Feeling of Stress : To some extent  Social Connections: Moderately Isolated   Frequency of Communication with Friends and Family: More than three times a week   Frequency of Social Gatherings with Friends and  Family: Once a week   Attends Religious Services: 1 to 4 times per year   Active Member of [employer] or Organizations: No   Attends Archivist Meetings: Never   Marital Status: Separated    Vitals:   07/30/20 1215  BP: 110/60  Pulse: 64  Resp: 18  Temp: 98.2 F (36.8 C)  SpO2: 99%   Body mass index is 30.79 kg/m.  Physical Exam Vitals and nursing note reviewed.  Constitutional:      General: She is not in acute distress.    Appearance: She is well-developed.  HENT:     Head: Normocephalic and atraumatic.     Mouth/Throat:     Mouth: Mucous membranes are moist.     Pharynx: Oropharynx is clear.  Eyes:     Conjunctiva/sclera: Conjunctivae normal.  Cardiovascular:     Rate and Rhythm: Normal rate and regular rhythm.     Pulses:          Dorsalis pedis pulses are 2+ on the right side and 2+ on the left side.     Heart sounds: No murmur heard. Pulmonary:     Effort: Pulmonary effort is normal. No respiratory distress.     Breath sounds: Normal breath sounds.  Abdominal:     Palpations: Abdomen is soft. There is no hepatomegaly or mass.     Tenderness: There is no abdominal tenderness.  Lymphadenopathy:     Cervical: No cervical adenopathy.  Skin:    General: Skin is warm.     Findings: No erythema or rash.     Comments: Right 5th finger distal phalange with blister, 2 cm. Soft, not tender,erythematous,or with local heat.  Neurological:     General: No focal deficit present.     Mental Status: She is alert and oriented to person, place, and time.     Cranial Nerves: No cranial nerve deficit.     Comments: Mildly unstable gait assisted with a cane.  Psychiatric:        Mood and Affect: Mood is anxious.   ASSESSMENT AND PLAN:  Jenna [name] was seen today for hospitalization follow-up.  Diagnoses and all orders for this visit: Orders Placed This Encounter  Procedures   DG Chest 2 View   Basic metabolic panel   CBC   Lab Results  Component Value Date    WBC 3.2 (L) 07/30/2020   HGB 8.3 Repeated and verified X2. (L) 07/30/2020   HCT 25.0 (L) 07/30/2020   MCV 93.7 07/30/2020   PLT 261.0 07/30/2020   Lab Results  Component Value Date   CREATININE 3.42 (H) 07/30/2020   BUN 96 (HH) 07/30/2020   NA 133 (L) 07/30/2020   K 3.3 (L) 07/30/2020   CL 95 (L) 07/30/2020   CO2 25 07/30/2020   Burn second degree of single l finger except thumb, init Avoid bursting blister. Monitor for signs of infection. Keep area uncovered. Topical Silvadene daily for up to 14 days.  -     silver sulfADIAZINE (SILVADENE) 1 % cream; Apply 1 application topically  daily for 14 days.  Hypertension with renal disease Today BP on the lower normal range. Continue metoprolol succinate 25 mg 1/2 tablet daily. Low-salt diet. Monitor BP regularly.  HFrEF (heart failure with reduced ejection fraction) ([name]) LVEF worsening since 05/2020. Today she seems to be euvolemic. Continue torsemide 40 mg daily and metoprolol succinate 25 mg 1/2 tablet daily. Because BP she is not on ACI/ARB,spironolactone. We discussed some side effects of medications. She is waiting for a new scale, so she can start daily wts. Continue low-salt diet. She is calling cardiologist to arrange follow-up appointment.  Anxiety disorder, unspecified type Problem is not well controlled. For now continue BuSpar 5 mg twice daily. Keep appointment with psychiatrist.  Cough Mild. Monitor for new associated symptoms. CXR order placed to be done this week at [facility].  Hypokalemia Mild. KLOR x 4 days then K+ rich diet. K+ 2 will recheck in 10 to 14 days, this can be done at her nephrologist office.  -     potassium chloride SA (KLOR-CON) 20 MEQ tablet; Take 1 tablet (20 mEq total) by mouth daily for 4 days.  Cytomegalovirus (CMV) viremia ([name]) -Viral load has dropped. Continue Valcyte 450 mg daily. Renal function to be monitored, if e GFR =< 10, it needs to be changed to q 2 days.  CKD (chronic  kidney disease) stage 5, GFR less than 15 ml/min ([name]) Secondary to lupus nephritis and FSGS. S/P renal transplant. Following with nephrologist.  Paroxysmal atrial fibrillation ([place]) She is not on chronic anticoagulation, no clear about reason why Eliquis was discontinued. For now she is not interested in resuming Eliquis, risk versus benefits discussed. Continue metoprolol succinate 25 mg 1/2 tablet daily. Following with cardiologist.   Spent 60 minutes.  During this time history was obtained and documented, examination was performed, prior labs/imaging reviewed, and assessment/plan discussed.  Tareq Dwan G. [name], MD  [facility]. [place] office.  A few things to remember from today's visit:  Hypertension with renal disease - Plan: Basic metabolic panel, CBC  Anxiety disorder, unspecified type  HFrEF (heart failure with reduced ejection fraction) ([place]) - Plan: Basic metabolic panel, CBC  Burn second degree of single l finger except thumb, init - Plan: silver sulfADIAZINE (SILVADENE) 1 % cream  If you need refills please call your pharmacy. Do not use My Chart to request refills or for acute issues that need immediate attention.   Continue PT at home. Fall precautions. Keep appt with psychiatrist. Please call your cardiologist's office. Do not burst blister.  Monitor blood pressure at home. For now no changes in Metoprolol.  Please be sure medication list is accurate. If a new problem present, please set up appointment sooner than planned today.

## 2020-08-01 ENCOUNTER — Telehealth: Payer: Self-pay | Admitting: Family Medicine

## 2020-08-01 ENCOUNTER — Other Ambulatory Visit: Payer: Self-pay

## 2020-08-01 DIAGNOSIS — T23222A Burn of second degree of single left finger (nail) except thumb, initial encounter: Secondary | ICD-10-CM

## 2020-08-01 NOTE — Telephone Encounter (Signed)
Please refer to prior telephone note and result note

## 2020-08-01 NOTE — Telephone Encounter (Signed)
Pt informed of the message below and verbalized understanding. Pt agreed to take Klor-con 20 meq and will continue with potassium diet. Patient stated that she would follow up with nephrologist regarding retesting potassium levels.

## 2020-08-01 NOTE — Telephone Encounter (Signed)
Patient returning call to Mykal.  She states to please give her a call back but if she doesn't answer don't leave a detailed message for what it is for on her answering machine.

## 2020-08-01 NOTE — Telephone Encounter (Signed)
Left a message for the pt to return my call.  

## 2020-08-04 ENCOUNTER — Other Ambulatory Visit: Payer: Self-pay

## 2020-08-04 DIAGNOSIS — T23222A Burn of second degree of single left finger (nail) except thumb, initial encounter: Secondary | ICD-10-CM

## 2020-08-04 MED ORDER — SILVER SULFADIAZINE 1 % EX CREA
1.0000 | TOPICAL_CREAM | Freq: Every day | CUTANEOUS | 0 refills | Status: AC
Start: 2020-08-04 — End: 2020-08-18

## 2020-08-08 ENCOUNTER — Telehealth: Payer: Self-pay | Admitting: Family Medicine

## 2020-08-08 NOTE — Telephone Encounter (Signed)
Aspirin and Prednisone can cause bruising. The only medication I see on her med list for pain is Acetaminophen, which she can take 500 mg 3-4 times per day prn. Thanks, BJ

## 2020-08-08 NOTE — Telephone Encounter (Signed)
Left a message for the pt to return my call.  

## 2020-08-08 NOTE — Telephone Encounter (Signed)
Patient talked to the AccessNurse and wants to know if hte medication she takes can cause bruises on her leg. Her left leg is burning and would like some information on her pain medication causing the pain as well.  Please advise

## 2020-08-11 NOTE — Telephone Encounter (Signed)
I spoke with patient. She has not been able to sleep. She is going to call her rheumatologist and see if she still needs to be taking both the prednisone & the hydrochloroquine. I advised her to call me back if she still is unable to sleep if they are able to discontinue one of the medications.

## 2020-08-21 ENCOUNTER — Encounter (HOSPITAL_COMMUNITY): Payer: Self-pay | Admitting: Emergency Medicine

## 2020-08-21 ENCOUNTER — Other Ambulatory Visit: Payer: Self-pay

## 2020-08-21 ENCOUNTER — Inpatient Hospital Stay (HOSPITAL_COMMUNITY)
Admission: EM | Admit: 2020-08-21 | Discharge: 2020-08-26 | DRG: 291 | Disposition: A | Discharge status: Home / Self Care | Payer: Medicare Other | Attending: Internal Medicine | Admitting: Internal Medicine

## 2020-08-21 ENCOUNTER — Emergency Department (HOSPITAL_COMMUNITY): Payer: Medicare Other

## 2020-08-21 DIAGNOSIS — Z8673 Personal history of transient ischemic attack (TIA), and cerebral infarction without residual deficits: Secondary | ICD-10-CM | POA: Diagnosis not present

## 2020-08-21 DIAGNOSIS — Z7952 Long term (current) use of systemic steroids: Secondary | ICD-10-CM

## 2020-08-21 DIAGNOSIS — F32A Depression, unspecified: Secondary | ICD-10-CM | POA: Diagnosis present

## 2020-08-21 DIAGNOSIS — M3214 Glomerular disease in systemic lupus erythematosus: Secondary | ICD-10-CM | POA: Diagnosis present

## 2020-08-21 DIAGNOSIS — Z940 Kidney transplant status: Secondary | ICD-10-CM

## 2020-08-21 DIAGNOSIS — I5020 Unspecified systolic (congestive) heart failure: Secondary | ICD-10-CM | POA: Diagnosis present

## 2020-08-21 DIAGNOSIS — I480 Paroxysmal atrial fibrillation: Secondary | ICD-10-CM | POA: Diagnosis present

## 2020-08-21 DIAGNOSIS — Z20822 Contact with and (suspected) exposure to covid-19: Secondary | ICD-10-CM | POA: Diagnosis present

## 2020-08-21 DIAGNOSIS — D84821 Immunodeficiency due to drugs: Secondary | ICD-10-CM | POA: Diagnosis present

## 2020-08-21 DIAGNOSIS — Z8249 Family history of ischemic heart disease and other diseases of the circulatory system: Secondary | ICD-10-CM

## 2020-08-21 DIAGNOSIS — J810 Acute pulmonary edema: Secondary | ICD-10-CM | POA: Diagnosis not present

## 2020-08-21 DIAGNOSIS — D631 Anemia in chronic kidney disease: Secondary | ICD-10-CM | POA: Diagnosis present

## 2020-08-21 DIAGNOSIS — M549 Dorsalgia, unspecified: Secondary | ICD-10-CM | POA: Diagnosis present

## 2020-08-21 DIAGNOSIS — I252 Old myocardial infarction: Secondary | ICD-10-CM | POA: Diagnosis not present

## 2020-08-21 DIAGNOSIS — M329 Systemic lupus erythematosus, unspecified: Secondary | ICD-10-CM | POA: Diagnosis present

## 2020-08-21 DIAGNOSIS — R7303 Prediabetes: Secondary | ICD-10-CM | POA: Diagnosis present

## 2020-08-21 DIAGNOSIS — E785 Hyperlipidemia, unspecified: Secondary | ICD-10-CM | POA: Diagnosis present

## 2020-08-21 DIAGNOSIS — M79671 Pain in right foot: Secondary | ICD-10-CM | POA: Diagnosis present

## 2020-08-21 DIAGNOSIS — G459 Transient cerebral ischemic attack, unspecified: Secondary | ICD-10-CM | POA: Diagnosis present

## 2020-08-21 DIAGNOSIS — N179 Acute kidney failure, unspecified: Secondary | ICD-10-CM | POA: Diagnosis present

## 2020-08-21 DIAGNOSIS — N184 Chronic kidney disease, stage 4 (severe): Secondary | ICD-10-CM | POA: Diagnosis present

## 2020-08-21 DIAGNOSIS — I5023 Acute on chronic systolic (congestive) heart failure: Secondary | ICD-10-CM

## 2020-08-21 DIAGNOSIS — Z86010 Personal history of colonic polyps: Secondary | ICD-10-CM | POA: Diagnosis not present

## 2020-08-21 DIAGNOSIS — I130 Hypertensive heart and chronic kidney disease with heart failure and stage 1 through stage 4 chronic kidney disease, or unspecified chronic kidney disease: Principal | ICD-10-CM | POA: Diagnosis present

## 2020-08-21 DIAGNOSIS — Z79899 Other long term (current) drug therapy: Secondary | ICD-10-CM | POA: Diagnosis not present

## 2020-08-21 DIAGNOSIS — Z880 Allergy status to penicillin: Secondary | ICD-10-CM

## 2020-08-21 DIAGNOSIS — I2510 Atherosclerotic heart disease of native coronary artery without angina pectoris: Secondary | ICD-10-CM | POA: Diagnosis present

## 2020-08-21 DIAGNOSIS — B259 Cytomegaloviral disease, unspecified: Secondary | ICD-10-CM | POA: Diagnosis present

## 2020-08-21 DIAGNOSIS — Z87442 Personal history of urinary calculi: Secondary | ICD-10-CM | POA: Diagnosis not present

## 2020-08-21 DIAGNOSIS — I255 Ischemic cardiomyopathy: Secondary | ICD-10-CM | POA: Diagnosis present

## 2020-08-21 DIAGNOSIS — I129 Hypertensive chronic kidney disease with stage 1 through stage 4 chronic kidney disease, or unspecified chronic kidney disease: Secondary | ICD-10-CM | POA: Diagnosis not present

## 2020-08-21 DIAGNOSIS — Z91018 Allergy to other foods: Secondary | ICD-10-CM

## 2020-08-21 DIAGNOSIS — Z7982 Long term (current) use of aspirin: Secondary | ICD-10-CM

## 2020-08-21 DIAGNOSIS — Z888 Allergy status to other drugs, medicaments and biological substances status: Secondary | ICD-10-CM

## 2020-08-21 DIAGNOSIS — I509 Heart failure, unspecified: Secondary | ICD-10-CM

## 2020-08-21 LAB — TROPONIN I (HIGH SENSITIVITY)
Troponin I (High Sensitivity): 121 ng/L (ref <18)
Troponin I (High Sensitivity): 110 ng/L (ref <18)

## 2020-08-21 LAB — BASIC METABOLIC PANEL
Anion gap: 9 (ref 5–15)
BUN: 64 mg/dL — ABNORMAL HIGH (ref 8–23)
CO2: 23 mmol/L (ref 22–32)
Calcium: 9.1 mg/dL (ref 8.9–10.3)
Chloride: 102 mmol/L (ref 98–111)
Creatinine, Ser: 3.43 mg/dL — ABNORMAL HIGH (ref 0.44–1.00)
GFR, Estimated: 14 mL/min — ABNORMAL LOW (ref >60)
Glucose, Bld: 103 mg/dL — ABNORMAL HIGH (ref 70–99)
Potassium: 3.8 mmol/L (ref 3.5–5.1)
Sodium: 134 mmol/L — ABNORMAL LOW (ref 135–145)

## 2020-08-21 LAB — CBC WITH DIFFERENTIAL/PLATELET
Abs Immature Granulocytes: 0.02 10*3/uL (ref 0.00–0.07)
Basophils Absolute: 0 10*3/uL (ref 0.0–0.1)
Basophils Relative: 1 %
Eosinophils Absolute: 0 10*3/uL (ref 0.0–0.5)
Eosinophils Relative: 2 %
HCT: 28.5 % — ABNORMAL LOW (ref 36.0–46.0)
Hemoglobin: 8.6 g/dL — ABNORMAL LOW (ref 12.0–15.0)
Immature Granulocytes: 1 %
Lymphocytes Relative: 14 %
Lymphs Abs: 0.4 10*3/uL — ABNORMAL LOW (ref 0.7–4.0)
MCH: 29.7 pg (ref 26.0–34.0)
MCHC: 30.2 g/dL (ref 30.0–36.0)
MCV: 98.3 fL (ref 80.0–100.0)
Monocytes Absolute: 0.5 10*3/uL (ref 0.1–1.0)
Monocytes Relative: 20 %
Neutro Abs: 1.7 10*3/uL (ref 1.7–7.7)
Neutrophils Relative %: 62 %
Platelets: 204 10*3/uL (ref 150–400)
RBC: 2.9 MIL/uL — ABNORMAL LOW (ref 3.87–5.11)
RDW: 19.1 % — ABNORMAL HIGH (ref 11.5–15.5)
WBC: 2.7 10*3/uL — ABNORMAL LOW (ref 4.0–10.5)
nRBC: 0 % (ref 0.0–0.2)

## 2020-08-21 LAB — BRAIN NATRIURETIC PEPTIDE: B Natriuretic Peptide: >4500 pg/mL — ABNORMAL HIGH (ref 0.0–100.0)

## 2020-08-21 LAB — RESP PANEL BY RT-PCR (FLU A&B, COVID) ARPGX2
Influenza A by PCR: NEGATIVE
Influenza B by PCR: NEGATIVE
SARS Coronavirus 2 by RT PCR: NEGATIVE

## 2020-08-21 LAB — MAGNESIUM: Magnesium: 2.1 mg/dL (ref 1.7–2.4)

## 2020-08-21 MED ORDER — FUROSEMIDE 10 MG/ML IJ SOLN: 80.0000 mg | Freq: Every day | INTRAMUSCULAR | Status: DC

## 2020-08-21 MED ORDER — HEPARIN SODIUM (PORCINE) 5000 UNIT/ML IJ SOLN
5000.0000 [IU] | Freq: Three times a day (TID) | INTRAMUSCULAR | Status: DC
  Filled 2020-08-21 (×4): qty 1

## 2020-08-21 MED ORDER — ACETAMINOPHEN 650 MG RE SUPP: 650.0000 mg | Freq: Four times a day (QID) | RECTAL | Status: DC | PRN

## 2020-08-21 MED ORDER — ACETAMINOPHEN 325 MG PO TABS
650.0000 mg | ORAL_TABLET | Freq: Four times a day (QID) | ORAL | Status: DC | PRN
  Administered 2020-08-22 – 2020-08-26 (×11): 650 mg via ORAL
  Filled 2020-08-21 (×11): qty 2

## 2020-08-21 MED ORDER — FUROSEMIDE 10 MG/ML IJ SOLN
80.0000 mg | Freq: Once | INTRAMUSCULAR | Status: AC
  Administered 2020-08-21: 80 mg via INTRAVENOUS
  Filled 2020-08-21: qty 8

## 2020-08-21 MED ORDER — SODIUM CHLORIDE 0.9 % IV SOLN: 250.0000 mL | INTRAVENOUS | Status: DC | PRN

## 2020-08-21 MED ORDER — SODIUM CHLORIDE 0.9% FLUSH
3.0000 mL | Freq: Two times a day (BID) | INTRAVENOUS | Status: DC
  Administered 2020-08-21 – 2020-08-26 (×9): 3 mL via INTRAVENOUS

## 2020-08-21 MED ORDER — SODIUM CHLORIDE 0.9% FLUSH
3.0000 mL | INTRAVENOUS | Status: DC | PRN
  Administered 2020-08-22: 3 mL via INTRAVENOUS

## 2020-08-21 NOTE — ED Provider Notes (Signed)
[facility] EMERGENCY DEPARTMENT Provider Note   CSN: [id number] Arrival date & time: 08/21/20  1039     History Chief Complaint  Patient presents with   Shortness of Breath    Jenna [name] is a 65 y.o. female.  65 year old female with extensive past medical history below including ESRD s/p renal transplant, SLE, HFrEF, HTN, CAD, A fib who p/w shortness of breath.  Patient reports that she has had progressively worsening shortness of breath over the past few days.  Shortness of breath is worse with exertion and she has gotten to where she can barely do any activities without getting winded and tired.  She endorses orthopnea and lower extremity edema.  She denies chest pain.  No cough/cold symptoms or fevers.  She recently doubled her torsemide to 40 mg daily without improvement.  The history is provided by the patient.  Shortness of Breath     Past Medical History:  Diagnosis Date   Anemia    Anxiety    panic attack- talks herself and takes deep breathes   CAD (coronary artery disease)    a. STEMI 10/2018 s/p DES to LAD.   Depression    Dyspnea    with exertion    ESRD (end stage renal disease) ([place])    hemo TTHSAT   Essential hypertension    FSGS (focal segmental glomerulosclerosis) 2011   By renal biopsy   Head injury    age 38   HFrEF (heart failure with reduced ejection fraction) ([place])    History of kidney stones    kidney stone   Hx of lupus nephritis 2011   by renal biopsy   Ischemic cardiomyopathy    Kidney transplant recipient    LA thrombus 10/2018   Lupus (systemic lupus erythematosus) ([place])    followed by Dr. 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 remote parietal infarct on brain MRI 12/2019.   Pericarditis    age 76ish   Renal stone    Secondary hyperparathyroidism of renal origin (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   Elevated troponin 07/11/2020   Anemia of chronic disease 07/11/2020   Acute kidney injury superimposed on chronic kidney disease ([place]) 07/11/2020   Allergic rhinitis 01/01/2020   Anxiety disorder 06/26/2019   HFrEF (heart failure with reduced ejection fraction) ([place]) 03/06/2019   CAD (coronary artery disease) 03/06/2019   Paroxysmal atrial fibrillation ([place]) 03/06/2019   Dyslipidemia 11/06/2017   ESRD on dialysis ([place]) 06/27/2017   CKD (chronic kidney disease) stage 5, GFR less than 15 ml/min ([name]) 02/15/2017   Back pain at L4-L5 level 10/31/2014   Muscle spasm of back 10/31/2014   Visit for screening mammogram 09/30/2014   Chronic maxillary sinusitis 07/04/2014   Occipital headache 07/04/2014   Increased urinary frequency 05/23/2014   Falls 05/23/2014   Prediabetes 05/23/2014   Rash and nonspecific skin eruption 05/23/2014   Hypertension with renal disease 04/16/2014   Encounter for immunization 11/19/2013   Lupus (systemic lupus erythematosus) ([place]) 09/24/2013   CKD (  chronic kidney disease) stage 4, GFR 15-29 ml/min ([name]) 07/02/2013   TIA (transient ischemic attack) 05/05/2013   Numbness and tingling of left side of face 05/04/2013   Lupus ([place]) 04/15/2011   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    Past Surgical History:  Procedure Laterality Date   AV FISTULA PLACEMENT Left 07/04/2017   Procedure: ARTERIOVENOUS (AV) FISTULA CREATION BRACHIOCEPHALIC;  Surgeon: [name], MD;  Location: [facility];  Service: Vascular;  Laterality: Left;   COLONOSCOPY W/ POLYPECTOMY     IR FLUORO GUIDE CV LINE RIGHT  06/28/2017    IR US GUIDE VASC ACCESS RIGHT  06/28/2017   KIDNEY SURGERY     kidney transplant 2020   REVISION OF ARTERIOVENOUS GORETEX GRAFT Left 11/30/2017   Procedure: REVISION OF ARTERIOVENOUS FISTULA LEFT ARM Superfistulization and branch ligation.;  Surgeon: Waynetta Sandy, MD;  Location: [facility];  Service: Vascular;  Laterality: Left;     OB History     Gravida  2   Para  1   Term      Preterm      AB  1   Living  1      SAB  1   IAB      Ectopic      Multiple      Live Births              Family History  Problem Relation Age of Onset   Diabetes Mother    Hypertension Father    Cancer Sister    Hypertension Brother     Social History   Tobacco Use   Smoking status: Never   Smokeless tobacco: Never  Vaping Use   Vaping Use: Never used  Substance Use Topics   Alcohol use: No   Drug use: No    Home Medications Prior to Admission medications   Medication Sig Start Date End Date Taking? Authorizing Provider  acetaminophen (TYLENOL) 500 MG tablet Take 1,000 mg by mouth every 6 (six) hours as needed for headache.    [provider]  aspirin 81 MG EC tablet Take 81 mg by mouth every morning. 11/21/19   [provider]  busPIRone (BUSPAR) 5 MG tablet Take 5 mg by mouth 2 (two) times daily. 06/30/20   [provider]  ceFEPIme 2 g in sodium chloride 0.9 % 100 mL Inject 2 g into the vein daily. 07/13/20   Nita Sells, MD  gabapentin (NEURONTIN) 100 MG capsule Take 1 capsule (100 mg total) by mouth at bedtime. 07/28/20   [name], Betty G, MD  hydroxychloroquine (PLAQUENIL) 200 MG tablet Take 1 tablet (200 mg total) by mouth 2 (two) times daily. Patient taking differently: Take 200 mg by mouth daily. 12/25/13   Tresa Garter, MD  linezolid (ZYVOX) 600 MG tablet Take 1 tablet (600 mg total) by mouth every 12 (twelve) hours. 07/12/20   Nita Sells, MD  metoprolol succinate (TOPROL-XL) 25 MG 24 hr tablet Take 0.5  tablets (12.5 mg total) by mouth daily. 07/13/20   Nita Sells, MD  nitroGLYCERIN (NITROSTAT) 0.4 MG SL tablet Place 0.4 mg under the tongue every 5 (five) minutes as needed for chest pain.     [provider]  potassium chloride SA (KLOR-CON) 20 MEQ tablet Take 1 tablet (20 mEq total) by mouth daily for 4 days. 07/30/20 08/03/20  [name], Betty G, MD  predniSONE (DELTASONE) 5 MG tablet Take 5 mg by mouth  daily with lunch. 05/16/19   [provider]  rosuvastatin (CRESTOR) 5 MG tablet Take 5 mg by mouth daily at 6 PM. 05/02/19   [provider]  tacrolimus (PROGRAF) 0.5 MG capsule Take 0.5-1 mg by mouth See admin instructions. Take 2 capsules (1 mg) by mouth every morning and 1 capsule (0.5 mg) at night    [provider]  valGANciclovir (VALCYTE) 450 MG tablet Take 450 mg by mouth every morning. 07/10/20   [provider]  valGANciclovir (VALCYTE) 450 MG tablet Take 450 mg by mouth daily.    [provider]    Allergies    Enalapril maleate, Penicillins, Chocolate, and Tape  Review of Systems   Review of Systems  Respiratory:  Positive for shortness of breath.   All other systems reviewed and are negative except that which was mentioned in HPI  Physical Exam Updated Vital Signs BP (!) 152/105 (BP Location: Right Arm)   Pulse 80   Temp (!) 97.5 F (36.4 C) (Oral)   Resp 20   SpO2 100%   Physical Exam Constitutional:      General: She is not in acute distress.    Appearance: Normal appearance.  HENT:     Head: Normocephalic and atraumatic.  Eyes:     Conjunctiva/sclera: Conjunctivae normal.  Neck:     Vascular: JVD present.  Cardiovascular:     Rate and Rhythm: Normal rate and regular rhythm.     Heart sounds: Normal heart sounds. No murmur heard. Pulmonary:     Comments: Dyspneic during conversation without respiratory distress, mildly increased WOB, faint crackles and diminished BS b/l bases Abdominal:     General:  Abdomen is flat. Bowel sounds are normal. There is no distension.     Palpations: Abdomen is soft.     Tenderness: There is no abdominal tenderness.  Musculoskeletal:     Right lower leg: Edema present.     Left lower leg: Edema present.  Skin:    General: Skin is warm and dry.  Neurological:     Mental Status: She is alert and oriented to person, place, and time.     Comments: fluent  Psychiatric:        Mood and Affect: Mood normal.        Behavior: Behavior normal.    ED Results / Procedures / Treatments   Labs (all labs ordered are listed, but only abnormal results are displayed) Labs Reviewed  BASIC METABOLIC PANEL - Abnormal; Notable for the following components:      Result Value   Sodium 134 (*)    Glucose, Bld 103 (*)    BUN 64 (*)    Creatinine, Ser 3.43 (*)    GFR, Estimated 14 (*)    All other components within normal limits  CBC WITH DIFFERENTIAL/PLATELET - Abnormal; Notable for the following components:   WBC 2.7 (*)    RBC 2.90 (*)    Hemoglobin 8.6 (*)    HCT 28.5 (*)    RDW 19.1 (*)    Lymphs Abs 0.4 (*)    All other components within normal limits  BRAIN NATRIURETIC PEPTIDE - Abnormal; Notable for the following components:   B Natriuretic Peptide >4,500.0 (*)    All other components within normal limits  RESP PANEL BY RT-PCR (FLU A&B, COVID) ARPGX2  TROPONIN I (HIGH SENSITIVITY)    EKG EKG Interpretation  Date/Time:  Thursday August 21 2020 10:46:37 EDT Ventricular Rate:  75 PR Interval:  164 QRS Duration: 126 QT Interval:  388 QTC Calculation: 433 R Axis:   -66 Text Interpretation: Sinus rhythm with occasional Premature ventricular complexes with ventricular escape complexes Left axis deviation Non-specific intra-ventricular conduction block Cannot rule out Septal infarct , age undetermined Lateral infarct , age undetermined Abnormal ECG occasional PVC new from previous, otherwise similar Confirmed by Theotis Burrow [phone]) on 08/21/2020 4:28:57  PM  Radiology DG Chest 2 View  Result Date: 08/21/2020 CLINICAL DATA:  Shortness of breath EXAM: CHEST - 2 VIEW COMPARISON:  Radiograph 07/12/2020 FINDINGS: Unchanged, enlarged cardiac silhouette. There is pulmonary vascular congestion with small bilateral pleural effusions and bibasilar atelectasis. No visible pneumothorax. No acute osseous abnormality. IMPRESSION: Cardiomegaly with pulmonary vascular congestion, small bilateral pleural effusions and adjacent bibasilar atelectasis. Electronically Signed   By: Maurine Simmering   On: 08/21/2020 11:41    Procedures Procedures   Medications Ordered in ED Medications  furosemide (LASIX) injection 80 mg (has no administration in time range)    ED Course  I have reviewed the triage vital signs and the nursing notes.  Pertinent labs & imaging results that were available during my care of the patient were reviewed by me and considered in my medical decision making (see chart for details).    MDM Rules/Calculators/A&P                          Dyspneic with mildly increased WOB, no respiratory distress on exam, hypertensive 631S systolic, stable on RA.  EKG without acute ischemic changes compared to previous.  Lab work shows creatinine similar to previous at 3.4, WBC 2.7, hemoglobin 8.6, BNP greater than 4500.  Chest x-ray with pulmonary vascular congestion, pleural effusions, and cardiomegaly suggestive of volume overload.  Gave the patient IV Lasix and recommended admission for diuresis given her work of breathing and significant volume overload.  Discussed admission w/ [facility], Dr. Rogers Blocker. Final Clinical Impression(s) / ED Diagnoses Final diagnoses:  Acute on chronic congestive heart failure, unspecified heart failure type ([place])  Acute pulmonary edema [facility])    Rx / DC Orders ED Discharge Orders     None        Anika Shore, Wenda Overland, MD 08/21/20 1805

## 2020-08-21 NOTE — H&P (Signed)
History and Physical    Jenna [name] GQQ:[id number] DOB: December 28, 1955 DOA: 08/21/2020  PCP: [name], Betty G, MD Consultants:  nephrology:Dr. Arnetha Massy with [facility] All other providers at [facility]. Dr. Lam[facility]ai cardiology at [facility].  Patient coming from:  Home - lives alone   Chief Complaint: shortness of breath and leg swelling   HPI: Jenna [name] is a 65 y.o. 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 She states she feels like she has fluid on her lungs. Also has orthopnea and can not walk as far as she normally does without getting short of breath. She does not wear oxygen at home. She has been taking her medication as prescribed since [facility] was [facility]. She does not weigh herself daily. She has been taking torsemide $RemoveBeforeDE'40mg'NxYUxIyECWITeed$  BID since 6/27 with no improvement in her symptoms.   Recent admit on 07/11/20 for acute on chronic CHF exacerbation and sepsis. She was transferred to [facility] for transplant sub speciality care.   TTE on 07/14/20: EF of 20-25% with similar akinesis of the lad territory.   ED Course: vitals on admission: bp: 126/80, HR: 78, afebrile, RR: 24 oxygen 96% on RA. Labs with BNP >4500, CXR with pulmonary congestion and bilateral small effusions, creatinine around baseline for past month of 3.42. no requiring oxygen. $RemoveBeforeDEI'80mg'WzqKTxtiReoTaUfm$  IV lasix ordered in ER. Asked to admit for CHF exacerbation.    Review of Systems: As per HPI; otherwise review of systems reviewed and negative.   Ambulatory Status:  Ambulates with cane and walker at home.   COVID Vaccine Status:    Past Medical History:  Diagnosis Date   Anemia    Anxiety    panic attack- talks herself and takes deep breathes   CAD (coronary artery disease)    a. STEMI  10/2018 s/p DES to LAD.   Depression    Dyspnea    with exertion    ESRD (end stage renal disease) ([place])    hemo TTHSAT   Essential hypertension    FSGS (focal segmental glomerulosclerosis) 2011   By renal biopsy   Head injury    age 51   HFrEF (heart failure with reduced ejection fraction) ([place])    History of kidney stones    kidney stone   Hx of lupus nephritis 2011   by renal biopsy   Ischemic cardiomyopathy    Kidney transplant recipient    LA thrombus 10/2018   Lupus (systemic lupus erythematosus) ([place])    followed by Dr. 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 remote parietal infarct on brain MRI 12/2019.   Pericarditis    age 35ish   Renal stone    Secondary hyperparathyroidism of renal origin ([place])    SLE (systemic lupus erythematosus) ([name])    TIA (transient ischemic attack)    no residual effects    Past Surgical History:  Procedure Laterality Date   AV FISTULA PLACEMENT Left 07/04/2017   Procedure: ARTERIOVENOUS (AV) FISTULA CREATION BRACHIOCEPHALIC;  Surgeon: [name], MD;  Location: [facility] OR;  Service: Vascular;  Laterality: Left;   COLONOSCOPY W/ POLYPECTOMY     IR FLUORO GUIDE CV LINE RIGHT  06/28/2017   IR US GUIDE VASC ACCESS RIGHT  06/28/2017   KIDNEY SURGERY     kidney transplant 2020   REVISION OF ARTERIOVENOUS GORETEX GRAFT Left 11/30/2017   Procedure: REVISION OF ARTERIOVENOUS FISTULA LEFT ARM Superfistulization and branch ligation.;  Surgeon: Waynetta Sandy, MD;  Location: [facility] OR;  Service: Vascular;  Laterality: Left;    Social History   Socioeconomic History   Marital status: Legally Separated    Spouse name: Not on file   Number of children: 1   Years of education: Not on file   Highest education level: Not on file  Occupational History   Not on file  Tobacco Use   Smoking status: Never   Smokeless tobacco: Never  Vaping Use    Vaping Use: Never used  Substance and Sexual Activity   Alcohol use: No   Drug use: No   Sexual activity: Not Currently  Other Topics Concern   Not on file  Social History Narrative   Right Handed   Lives in a two story home   Daughter recently got married    Social Determinants of Health   Financial Resource Strain: Low Risk    Difficulty of Paying Living Expenses: Not very hard  Food Insecurity: No Food Insecurity   Worried About Charity fundraiser in the Last Year: Never true   [place] in the Last Year: Never true  Transportation Needs: No Transportation Needs   Lack of Transportation (Medical): No   Lack of Transportation (Non-Medical): No  Physical Activity: Sufficiently Active   Days of Exercise per Week: 5 days   Minutes of Exercise per Session: 30 min  Stress: Stress Concern Present   Feeling of Stress : To some extent  Social Connections: Moderately Isolated   Frequency of Communication with Friends and Family: More than three times a week   Frequency of Social Gatherings with Friends and Family: Once a week   Attends Religious Services: 1 to 4 times per year   Active Member of [employer] or Organizations: No   Attends Archivist Meetings: Never   Marital Status: Separated  Intimate Partner Violence: Not At Risk   Fear of Current or Ex-Partner: No   Emotionally Abused: No   Physically Abused: No   Sexually Abused: No    Allergies  Allergen Reactions   Enalapril Maleate Anaphylaxis, Swelling and Other (See Comments)    Throat swells   Penicillins Other (See Comments)    Made patient lightheaded PATIENT HAS HAD A PCN REACTION WITH IMMEDIATE RASH, FACIAL/TONGUE/THROAT SWELLING, SOB, OR LIGHTHEADEDNESS WITH HYPOTENSION:  #  #  YES  #  #  Has patient had a PCN reaction causing severe rash involving mucus membranes or skin necrosis: No Has patient had a PCN reaction that required hospitalization: No Has patient had a PCN reaction occurring within the  last 10 years: No If all of the above answers are "NO", then may proceed with Cephalosporin use.    Chocolate Nausea And Vomiting   Tape Itching, Rash and Other (See Comments)    Family History  Problem Relation Age of Onset   Diabetes Mother    Hypertension Father    Cancer Sister    Hypertension Brother     Prior to Admission medications   Medication Sig Start Date End Date Taking? Authorizing Provider  acetaminophen (TYLENOL) 500 MG tablet Take 1,000 mg by mouth every 6 (six) hours as needed for headache.  [provider]  aspirin 81 MG EC tablet Take 81 mg by mouth every morning. 11/21/19   [provider]  busPIRone (BUSPAR) 5 MG tablet Take 5 mg by mouth 2 (two) times daily. 06/30/20   [provider]  ceFEPIme 2 g in sodium chloride 0.9 % 100 mL Inject 2 g into the vein daily. 07/13/20   Nita Sells, MD  gabapentin (NEURONTIN) 100 MG capsule Take 1 capsule (100 mg total) by mouth at bedtime. 07/28/20   [name], Betty G, MD  hydroxychloroquine (PLAQUENIL) 200 MG tablet Take 1 tablet (200 mg total) by mouth 2 (two) times daily. Patient taking differently: Take 200 mg by mouth daily. 12/25/13   Tresa Garter, MD  linezolid (ZYVOX) 600 MG tablet Take 1 tablet (600 mg total) by mouth every 12 (twelve) hours. 07/12/20   Nita Sells, MD  metoprolol succinate (TOPROL-XL) 25 MG 24 hr tablet Take 0.5 tablets (12.5 mg total) by mouth daily. 07/13/20   Nita Sells, MD  nitroGLYCERIN (NITROSTAT) 0.4 MG SL tablet Place 0.4 mg under the tongue every 5 (five) minutes as needed for chest pain.     [provider]  potassium chloride SA (KLOR-CON) 20 MEQ tablet Take 1 tablet (20 mEq total) by mouth daily for 4 days. 07/30/20 08/03/20  [name], Betty G, MD  predniSONE (DELTASONE) 5 MG tablet Take 5 mg by mouth daily with lunch. 05/16/19   [provider]  rosuvastatin (CRESTOR) 5 MG tablet Take 5 mg by mouth daily at 6 PM. 05/02/19    [provider]  tacrolimus (PROGRAF) 0.5 MG capsule Take 0.5-1 mg by mouth See admin instructions. Take 2 capsules (1 mg) by mouth every morning and 1 capsule (0.5 mg) at night    [provider]  valGANciclovir (VALCYTE) 450 MG tablet Take 450 mg by mouth every morning. 07/10/20   [provider]  valGANciclovir (VALCYTE) 450 MG tablet Take 450 mg by mouth daily.    [provider]    Physical Exam: Vitals:   08/21/20 1545 08/21/20 1626 08/21/20 1730 08/21/20 1841  BP: 134/80 (!) 152/105 (!) 158/89 (!) 152/80  Pulse: 81 80 81 84  Resp: $Remo'17 20 19 20  'YZEcI$ Temp:  (!) 97.5 F (36.4 C)    TempSrc:  Oral    SpO2: 99% 100% 98% 96%     General:  Appears calm and comfortable and is in NAD, but working to breath Eyes:  PERRL, EOMI, normal lids, iris ENT:  grossly normal hearing, lips & tongue, mmm; appropriate dentition Neck:  no LAD, masses or thyromegaly; no carotid bruits Cardiovascular:  RRR, no m/r/g. Bilateral LE edema to below the knee  Respiratory:   crackles in bases, left >R. No wheezing.  Respiratory effort mildly increased with talking.  Abdomen:  soft, NT, ND, NABS Back:   normal alignment, no CVAT Skin:  no rash or induration seen on limited exam Musculoskeletal:  grossly normal tone BUE/BLE, good ROM, no bony abnormality. TTP over her right trapezius   Limited foot exam with no ulcerations.  2+ distal pulses. Psychiatric:  grossly normal mood and affect, speech fluent and appropriate, AOx3 Neurologic:  CN 2-12 grossly intact, moves all extremities in coordinated fashion, sensation intact    Radiological Exams on Admission: Independently reviewed - see discussion in A/P where applicable  DG Chest 2 View  Result Date: 08/21/2020 CLINICAL DATA:  Shortness of breath EXAM: CHEST - 2 VIEW COMPARISON:  Radiograph 07/12/2020 FINDINGS: Unchanged, enlarged cardiac  silhouette. There is pulmonary vascular congestion with small bilateral pleural  effusions and bibasilar atelectasis. No visible pneumothorax. No acute osseous abnormality. IMPRESSION: Cardiomegaly with pulmonary vascular congestion, small bilateral pleural effusions and adjacent bibasilar atelectasis. Electronically Signed   By: Maurine Simmering   On: 08/21/2020 11:41    EKG: Independently reviewed.  NSR with rate 75, few PVC; nonspecific ST changes with no evidence of acute ischemia   Labs on Admission: I have personally reviewed the available labs and imaging studies at the time of the admission.  Pertinent labs:  Creatinine: 3.43 BUN: 64 Hgb: 8.6 (baseline: 8.0-8.7) Wbc: 2.7 Bnp: >4500 CXR with pulmonary vascular congestion, small bilateral pleural effusions and adjacent bibasilar atelectasis    Assessment/Plan Principal Problem:   Acute on chronic systolic CHF (congestive heart failure) ([name]) -bnp>4500, CXR with pulmonary edema and effusion, and clinically worsening shortness of breath with lower leg edema -cardiology consulted -80mg  IV lasix tonight in ER will see response and increase if needed. Okay per nephrology as well. Will be following along.  -strict I/Os, daily weights, electrolytes daily. May need potassium repletion  -recent echo with worsening EF to 20% -continue beta blocker. NO ACEi/ARB due to renal function -per cardiology investigate cause of decreased EF. F/u on recommendations.  -maintaining oxygenation on room air but working to breath with conversation. No distress.  will watch closely   Active Problems:   CAD (coronary artery disease) -continue statin, ASA, beta blocker -lipid panel pending   Elevated troponin -no chest pain and no st elevation on ekg Likely secondary to CHF exac Continue to trend troponin, tele monitoring 121--->    Lupus (systemic lupus erythematosus) ([place]) -nephrology consulted and will see in AM -renal function at baseline -continue plaquenil     Hypertension with renal disease Has been fairly  controlled Continue home medication and monitor     Paroxysmal atrial fibrillation ([place]) -in NSR, unsure why anticoagulation stopped. Hx of DCCV in past. Cardiology consulted for CHF and recommends we start this back if no contraindication. She has a [facility]ology at [facility]. Try to touch base with them before discharge about starting back -on tele -continue beta blocker.   CKD stage IV -appears at her baseline for past month. 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 Treated with IV ganciclovir and currently on oral valcyte 450mg  q 48 hours -no signs of recurrent infection on admit. Last CMV viral load PCR was 641 on 07/22/20.  -she has had no fever and CXR not suspicious at this time for recurrence.     Prediabetes -a1c of 5.3 in 06/2020 -diet controlled    Dyslipidemia -continue home medication of crestor.  No lipid panel since 2019, will order this for aM.     TIA (transient ischemic attack) -continue statin, ASA.      Deceased-donor kidney transplant -secondary to FSGS/lupus nephritis s/p transplant in 2020.  -nephrology consulted and talked to Dr. Justin Mend -recommended continue all her medication at this time and will see in AM.  -continue prednisone and prograf     There is no height or weight on file to calculate BMI.   Level of care: Telemetry Medical DVT prophylaxis:  heparin  Code Status:  Full - confirmed with patient Family Communication: daughter present evita Biss Disposition Plan:  The patient is from: home  Patient is currently: acutely ill  Consults called: cardiology and nephrology   Admission status:  inpatient    Orma Flaming MD [facility]   How to contact the [facility] Attending or Consulting provider Brenda or covering provider during after hours [place], for this patient?  Check  the care team in [facility] and look for a) attending/consulting [facility] provider listed and b) the [facility] team listed Log into [email or web address] and use [place]'s universal password to access. If you do not have the password, please contact the hospital operator. Locate the [facility] provider you are looking for under [facility] and page to a number that you can be directly reached. If you still have difficulty reaching the provider, please page the [facility] (Director on Call) for the Hospitalists listed on amion for assistance.   08/21/2020, 8:49 PM

## 2020-08-21 NOTE — ED Triage Notes (Signed)
Patient complains of worsening intermittent shortness of breath over the last several days as well as bilateral lower extremity swelling. Patient alert and oriented, room air SpO2 96%, difficulty speaking in complete sentences.

## 2020-08-21 NOTE — ED Provider Notes (Signed)
Emergency Medicine Provider Triage Evaluation Note  Jenna [name] , a 65 y.o. female  was evaluated in triage.  Pt complains of shortness of breath x3 days.  History of lupus, kidney transplant, sinusitis.  No alleviating factors. No antibiotics recently.  No chest pain.  Review of Systems  Positive: SOB, congestion  Negative: Chest pain  Physical Exam  BP 126/80   Pulse 78   Temp 98.1 F (36.7 C) (Oral)   Resp (!) 24   SpO2 96%  Gen:   Awake, no distress   Resp:  Normal effort.  Tachypneic without accessory muscle use.  Wheezing to the upper right lobe, rales diffusely throughout the lung fields..  MSK:   Moves extremities without difficulty  Other:    Medical Decision Making  Medically screening exam initiated at 10:53 AM.  Appropriate orders placed.  Jenna [name] was informed that the remainder of the evaluation will be completed by another provider, this initial triage assessment does not replace that evaluation, and the importance of remaining in the ED until their evaluation is complete.     Sherrill Raring, PA-C 08/21/20 1054    Daleen Bo, MD 08/23/20 1331

## 2020-08-21 NOTE — ED Notes (Signed)
Attempted IV twice, unsuccessful.

## 2020-08-21 NOTE — Consult Note (Addendum)
Cardiology Consultation:   Patient ID: Jenna [name] MRN: [id number]; DOB: [birth year]  Admit date: 08/21/2020 Date of Consult: 08/21/2020  PCP:  [name], Betty G, [place] Providers Cardiologist:  None   Dr. Norman Clay at [facility]      Patient Profile:   Jenna [name] is a 65 y.o. 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 Rogers Blocker.  History of Present Illness:   Ms. Hartl was hospitalized from 5/27 - 07/12/2020 for sepsis and acute decompensated CHF.  She was transferred to [facility] for transplant subspecialty care.  Since discharge, she has been getting steadily weaker and increasing shortness of breath.  She has had increasing lower extremity edema, and describes orthopnea and PND.  She does not have scales and does not know how much weight she has gained.  She has been compliant with medications, but her BUN was recently 96 and her GFR was 13.58.  She was seen by her PCP Dr. [name] on 6/15 and she was felt to be euvolemic on torsemide 40 mg daily and Toprol-XL 12.5 mg daily.  Her weight that day was 89.2 kg  In the emergency room, she has significantly increased work of breathing and increased respiratory rate.  She is short of breath with minimal conversation.  She is maintaining O2 saturations at this time but is getting tired.  She states that she has been at this level of shortness of breath for 3 days.  6/27, a phone note recommends that she take torsemide 40 mg twice daily for 5 days and see if that helps.  The  patient states she has been doing that, but it did not help.   Past Medical History:  Diagnosis Date   Anemia    Anxiety    panic attack- talks herself and takes deep breathes   CAD (coronary artery disease)    a. STEMI 10/2018 s/p DES to LAD.   Depression    Dyspnea    with exertion    ESRD (end stage renal disease) ([place])    hemo TTHSAT   Essential hypertension    FSGS (focal segmental glomerulosclerosis) 2011   By renal biopsy   Head injury    age 57   HFrEF (heart failure with reduced ejection fraction) ([place])    History of kidney stones    kidney stone   Hx of lupus nephritis 2011   by renal biopsy   Ischemic cardiomyopathy    Kidney transplant recipient    LA thrombus 10/2018   Lupus (systemic lupus erythematosus) ([place])    followed by Dr. Amil Amen   Obesity    Orthostatic hypotension    PAF (paroxysmal atrial fibrillation) ([place])    Parietal lobe infarction ([place])    a. remote parietal infarct on brain MRI 12/2019.   Pericarditis    age 49ish   Renal stone    Secondary hyperparathyroidism of renal origin ([place])    SLE (systemic lupus erythematosus) ([name])    TIA (transient ischemic attack)    no residual effects  Past Surgical History:  Procedure Laterality Date   AV FISTULA PLACEMENT Left 07/04/2017   Procedure: ARTERIOVENOUS (AV) FISTULA CREATION BRACHIOCEPHALIC;  Surgeon: [name], MD;  Location: [place];  Service: Vascular;  Laterality: Left;   COLONOSCOPY W/ POLYPECTOMY     IR FLUORO GUIDE CV LINE RIGHT  06/28/2017   IR US GUIDE VASC ACCESS RIGHT  06/28/2017   KIDNEY SURGERY     kidney transplant 2020   REVISION OF ARTERIOVENOUS GORETEX GRAFT Left 11/30/2017   Procedure: REVISION OF ARTERIOVENOUS FISTULA LEFT ARM Superfistulization and branch ligation.;  Surgeon: Waynetta Sandy, MD;  Location: [place];  Service: Vascular;  Laterality: Left;     Home Medications:  Prior to Admission medications   Medication Sig Start Date End Date Taking?  Authorizing Provider  acetaminophen (TYLENOL) 500 MG tablet Take 1,000 mg by mouth every 6 (six) hours as needed for headache.    [provider]  aspirin 81 MG EC tablet Take 81 mg by mouth every morning. 11/21/19   [provider]  busPIRone (BUSPAR) 5 MG tablet Take 5 mg by mouth 2 (two) times daily. 06/30/20   [provider]  ceFEPIme 2 g in sodium chloride 0.9 % 100 mL Inject 2 g into the vein daily. 07/13/20   Nita Sells, MD  gabapentin (NEURONTIN) 100 MG capsule Take 1 capsule (100 mg total) by mouth at bedtime. 07/28/20   [name], Betty G, MD  hydroxychloroquine (PLAQUENIL) 200 MG tablet Take 1 tablet (200 mg total) by mouth 2 (two) times daily. Patient taking differently: Take 200 mg by mouth daily. 12/25/13   Tresa Garter, MD  linezolid (ZYVOX) 600 MG tablet Take 1 tablet (600 mg total) by mouth every 12 (twelve) hours. 07/12/20   Nita Sells, MD  metoprolol succinate (TOPROL-XL) 25 MG 24 hr tablet Take 0.5 tablets (12.5 mg total) by mouth daily. 07/13/20   Nita Sells, MD  nitroGLYCERIN (NITROSTAT) 0.4 MG SL tablet Place 0.4 mg under the tongue every 5 (five) minutes as needed for chest pain.     [provider]  potassium chloride SA (KLOR-CON) 20 MEQ tablet Take 1 tablet (20 mEq total) by mouth daily for 4 days. 07/30/20 08/03/20  [name], Betty G, MD  predniSONE (DELTASONE) 5 MG tablet Take 5 mg by mouth daily with lunch. 05/16/19   [provider]  rosuvastatin (CRESTOR) 5 MG tablet Take 5 mg by mouth daily at 6 PM. 05/02/19   [provider]  tacrolimus (PROGRAF) 0.5 MG capsule Take 0.5-1 mg by mouth See admin instructions. Take 2 capsules (1 mg) by mouth every morning and 1 capsule (0.5 mg) at night    [provider]  valGANciclovir (VALCYTE) 450 MG tablet Take 450 mg by mouth every morning. 07/10/20   [provider]  valGANciclovir (VALCYTE) 450 MG tablet Take 450 mg by mouth daily.     [provider]    Inpatient Medications: Scheduled Meds:  heparin  5,000 Units Subcutaneous Q8H   sodium chloride flush  3 mL Intravenous Q12H   Continuous Infusions:  sodium chloride     PRN Meds: sodium chloride, acetaminophen **OR** acetaminophen, sodium chloride flush  Allergies:    Allergies  Allergen Reactions   Enalapril Maleate Anaphylaxis, Swelling and Other (See Comments)    Throat swells   Penicillins Other (See Comments)    Made patient lightheaded PATIENT HAS HAD A PCN REACTION WITH IMMEDIATE RASH, FACIAL/TONGUE/THROAT SWELLING, SOB, OR LIGHTHEADEDNESS WITH HYPOTENSION:  #  #  YES  #  #  Has patient had a PCN reaction causing severe rash involving mucus membranes or skin necrosis: No Has patient had a PCN reaction that required hospitalization: No Has patient had a PCN reaction occurring within the last 10 years: No If all of the above answers are "NO", then may proceed with Cephalosporin use.    Chocolate Nausea And Vomiting   Tape Itching, Rash and Other (See Comments)    Social History:   Social History   Socioeconomic History   Marital status: Legally Separated    Spouse name: Not on file   Number of children: 1   Years of education: Not on file   Highest education level: Not on file  Occupational History   Not on file  Tobacco Use   Smoking status: Never   Smokeless tobacco: Never  Vaping Use   Vaping Use: Never used  Substance and Sexual Activity   Alcohol use: No   Drug use: No   Sexual activity: Not Currently  Other Topics Concern   Not on file  Social History Narrative   Right Handed   Lives in a two story home   Daughter recently got married    Social Determinants of Health   Financial Resource Strain: Low Risk    Difficulty of Paying Living Expenses: Not very hard  Food Insecurity: No Food Insecurity   Worried About Charity fundraiser in the Last Year: Never true   [place] in the Last Year: Never true   Transportation Needs: No Transportation Needs   Lack of Transportation (Medical): No   Lack of Transportation (Non-Medical): No  Physical Activity: Sufficiently Active   Days of Exercise per Week: 5 days   Minutes of Exercise per Session: 30 min  Stress: Stress Concern Present   Feeling of Stress : To some extent  Social Connections: Moderately Isolated   Frequency of Communication with Friends and Family: More than three times a week   Frequency of Social Gatherings with Friends and Family: Once a week   Attends Religious Services: 1 to 4 times per year   Active Member of [employer] or Organizations: No   Attends Archivist Meetings: Never   Marital Status: Separated  Intimate Partner Violence: Not At Risk   Fear of Current or Ex-Partner: No   Emotionally Abused: No   Physically Abused: No   Sexually Abused: No    Family History:    Family History  Problem Relation Age of Onset   Diabetes Mother    Hypertension Father    Cancer Sister    Hypertension Brother      ROS:  Please see the history of present illness.  All other ROS reviewed and negative.     Physical Exam/Data:   Vitals:   08/21/20 1545 08/21/20 1626 08/21/20 1730 08/21/20 1841  BP: 134/80 (!) 152/105 (!) 158/89 (!) 152/80  Pulse: 81 80 81 84  Resp: $Remo'17 20 19 20  'MZnkD$ Temp:  (!) 97.5 F (36.4 C)    TempSrc:  Oral    SpO2: 99% 100% 98% 96%   No intake or output data in the 24 hours ending 08/21/20 1927 Last 3 Weights 07/30/2020 07/12/2020 07/11/2020  Weight (lbs) 196 lb 9.6 oz 211 lb 6.7 oz 207 lb 7.3 oz  Weight (kg) 89.177 kg 95.9 kg 94.1 kg     There is no height or weight on file to calculate BMI.  General:  Well nourished, well  developed, in acute distress HEENT: normal for age Lymph: no adenopathy Neck: JVD 11 cm Endocrine:  No thryomegaly Vascular: No carotid bruits; 4/4 extremity pulses 2+ bilaterally, AV fistula with bruit noted left upper extremity Cardiac:  normal S1, S2; RRR; no murmur   Lungs: Dense Rales to auscultation bilaterally, no wheezing, rhonchi or rales  Abd: soft, nontender, no hepatomegaly  Ext: 1-2+ edema Musculoskeletal:  No deformities, BUE and BLE strength normal and equal Skin: warm and dry  Neuro:  CNs 2-12 intact, no focal abnormalities noted Psych:  Normal affect   EKG:  The EKG was personally reviewed and demonstrates:  Sinus rhythm with left bundle morphology and no acute ischemic changes, heart rate 74, QRS duration 126 ms Telemetry:  Telemetry was personally reviewed and demonstrates: Sinus rhythm with occasional PVCs  Relevant CV Studies: ECHO: 08/19/2020 SUMMARY  The left ventricular size is normal.  There is normal left ventricular wall thickness.  Left ventricular systolic function is severely reduced.  LV ejection fraction = 20-25%.  Apical akinesis with aneurysmal changes. Anteroseptum and apical  septum akinetic. Moderate hypokinesis of other segments.  Left ventricular filling pattern is indeterminate.  The right ventricle is mildly dilated.  The right ventricular systolic function is mildly reduced.  The left atrium is moderately dilated.  The right atrium is moderately dilated.  Moderate mitral regurgitation with centrally directed jet likely in  setting of mitral annular dilation (diameter 4.0cm).  There is moderate to severe tricuspid regurgitation.  Estimated right ventricular systolic pressure is 52 mmHg.  Moderate pulmonary hypertension.  The inferior vena cava is moderately dilated with a decrease in  inspiratory collapse.  There is a pleural effusion present.  There is no pericardial effusion.  Probably no significant change in comparison with the prior study  noted    Laboratory Data:  High Sensitivity Troponin:   Recent Labs  Lab 08/21/20 1630  TROPONINIHS 121*     Chemistry Recent Labs  Lab 08/21/20 1053  NA 134*  K 3.8  CL 102  CO2 23  GLUCOSE 103*  BUN 64*  CREATININE 3.43*  CALCIUM 9.1   GFRNONAA 14*  ANIONGAP 9    No results for input(s): PROT, ALBUMIN, AST, ALT, ALKPHOS, BILITOT in the last 168 hours. Hematology Recent Labs  Lab 08/21/20 1053  WBC 2.7*  RBC 2.90*  HGB 8.6*  HCT 28.5*  MCV 98.3  MCH 29.7  MCHC 30.2  RDW 19.1*  PLT 204   BNP Recent Labs  Lab 08/21/20 1053  BNP >4,500.0*    DDimer No results for input(s): DDIMER in the last 168 hours. Lab Results  Component Value Date   TSH 3.10 04/04/2019   Lab Results  Component Value Date   HGBA1C 5.3 07/11/2020   Lab Results  Component Value Date   CHOL 124 05/23/2014   HDL 27 (L) 05/23/2014   LDLCALC 69 05/23/2014   TRIG 138 05/23/2014   CHOLHDL 4.6 05/23/2014     Radiology/Studies:  DG Chest 2 View  Result Date: 08/21/2020 CLINICAL DATA:  Shortness of breath EXAM: CHEST - 2 VIEW COMPARISON:  Radiograph 07/12/2020 FINDINGS: Unchanged, enlarged cardiac silhouette. There is pulmonary vascular congestion with small bilateral pleural effusions and bibasilar atelectasis. No visible pneumothorax. No acute osseous abnormality. IMPRESSION: Cardiomegaly with pulmonary vascular congestion, small bilateral pleural effusions and adjacent bibasilar atelectasis. Electronically Signed   By: Maurine Simmering   On: 08/21/2020 11:41     Assessment and Plan:   Acute on chronic  systolic CHF: -Daily weights, strict I's/O and daily BMET -Recent echo shows EF 20% -She sees doctors at [facility] -She is on a low-dose of a beta-blocker but no ACE/ARB/ARN I because of decreased renal function -Assess response to Lasix 80 mg, if inadequate, increase to 120 mg at and discuss with MD if it should be twice daily or 3 times daily  2.  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  I have reviewed the chart, notes and new data.  I agree with PA/NP's note.  Key new complaints: she has had gradually worsening edema, orthopnea, culminating in PND these last couple of nights. Does not weigh herself. Doubling home dose of torsemide has not helped. Key examination changes: generalized edema, JVD present, lung crackles in both bases, RRR w occ ectopy, S3 present, working AV fistula left arm with good thrill/bruit Key new findings / data: echo on 07/05 at [facility] showed further decrease in EF from 35% to 20-25% per report at [facility] (unable to review images), BNP>4500, creat at baseline (approx 3.4), cardiomegaly and pulmonary vascular congestion on CXR.  PLAN: - Acute on chronic systolic HF w recent deterioration in LVEF - Needs aggressive diuresis with IV meds, after failing PO meds increase as outpatient. Suspect 10 lb or more volume excess, but not weighed yet . "Dry weight" seems to be 200-205 lb. Closely monitor renal function w transplanted kidney and CKD 4 baseline. - Cause of marked decrease in EF needs to be investigated further. CAD is clearly the major suspected cause, but she has not had recent angina. Unfortunately, coronary angiography is fraught w risk of worsening kidney failure - Thankfully, she is maintaining NSR, but has history of AFib requiring DCCV, although no longer on anticoagulation (not sure why Eliquis stopped, still on it at Cardiology OP visit w Dr. Norman Clay on 05/09/2020, but was no longer on it during 05/31/2020  [facility] admission w CMV viremia and never restarted). High likelihood of AFib recurrence, high CHADSVasc score and known previous thrombus on TEE, so I would advise restarting anticoagulation in  the absence of bleeding complications. - Prognosis is very guarded due to multiple severe and complex comorbid conditions.  [name], MD, [place] [phone] 08/21/2020, 7:52 PM

## 2020-08-22 ENCOUNTER — Other Ambulatory Visit: Payer: Self-pay | Admitting: Family Medicine

## 2020-08-22 ENCOUNTER — Ambulatory Visit: Payer: Medicare Other | Admitting: Family Medicine

## 2020-08-22 LAB — CBC
HCT: 29.8 % — ABNORMAL LOW (ref 36.0–46.0)
Hemoglobin: 9.3 g/dL — ABNORMAL LOW (ref 12.0–15.0)
MCH: 29.6 pg (ref 26.0–34.0)
MCHC: 31.2 g/dL (ref 30.0–36.0)
MCV: 94.9 fL (ref 80.0–100.0)
Platelets: 205 10*3/uL (ref 150–400)
RBC: 3.14 MIL/uL — ABNORMAL LOW (ref 3.87–5.11)
RDW: 19 % — ABNORMAL HIGH (ref 11.5–15.5)
WBC: 3.4 10*3/uL — ABNORMAL LOW (ref 4.0–10.5)
nRBC: 0 % (ref 0.0–0.2)

## 2020-08-22 LAB — BASIC METABOLIC PANEL
Anion gap: 13 (ref 5–15)
BUN: 58 mg/dL — ABNORMAL HIGH (ref 8–23)
CO2: 20 mmol/L — ABNORMAL LOW (ref 22–32)
Calcium: 9.4 mg/dL (ref 8.9–10.3)
Chloride: 104 mmol/L (ref 98–111)
Creatinine, Ser: 3.3 mg/dL — ABNORMAL HIGH (ref 0.44–1.00)
GFR, Estimated: 15 mL/min — ABNORMAL LOW (ref >60)
Glucose, Bld: 83 mg/dL (ref 70–99)
Potassium: 4.1 mmol/L (ref 3.5–5.1)
Sodium: 137 mmol/L (ref 135–145)

## 2020-08-22 LAB — MAGNESIUM: Magnesium: 2 mg/dL (ref 1.7–2.4)

## 2020-08-22 MED ORDER — ISOSORBIDE MONONITRATE ER 30 MG PO TB24
30.0000 mg | ORAL_TABLET | Freq: Every day | ORAL | Status: DC
  Administered 2020-08-22 – 2020-08-23 (×2): 30 mg via ORAL
  Filled 2020-08-22 (×2): qty 1

## 2020-08-22 MED ORDER — NITROGLYCERIN 0.4 MG SL SUBL
0.4000 mg | SUBLINGUAL_TABLET | SUBLINGUAL | Status: DC | PRN
  Administered 2020-08-23: 0.4 mg via SUBLINGUAL
  Filled 2020-08-22: qty 1

## 2020-08-22 MED ORDER — VALGANCICLOVIR HCL 450 MG PO TABS
450.0000 mg | ORAL_TABLET | ORAL | Status: DC
  Administered 2020-08-22 – 2020-08-26 (×3): 450 mg via ORAL
  Filled 2020-08-22 (×3): qty 1

## 2020-08-22 MED ORDER — ROSUVASTATIN CALCIUM 5 MG PO TABS
5.0000 mg | ORAL_TABLET | Freq: Every day | ORAL | Status: DC
  Administered 2020-08-22 – 2020-08-25 (×4): 5 mg via ORAL
  Filled 2020-08-22 (×4): qty 1

## 2020-08-22 MED ORDER — ASPIRIN EC 81 MG PO TBEC
81.0000 mg | DELAYED_RELEASE_TABLET | Freq: Every morning | ORAL | Status: DC
  Administered 2020-08-22 – 2020-08-26 (×5): 81 mg via ORAL
  Filled 2020-08-22 (×5): qty 1

## 2020-08-22 MED ORDER — FUROSEMIDE 10 MG/ML IJ SOLN
80.0000 mg | Freq: Two times a day (BID) | INTRAMUSCULAR | Status: DC
  Administered 2020-08-22 (×2): 80 mg via INTRAVENOUS
  Filled 2020-08-22 (×3): qty 8

## 2020-08-22 MED ORDER — HYDRALAZINE HCL 25 MG PO TABS
25.0000 mg | ORAL_TABLET | Freq: Three times a day (TID) | ORAL | Status: DC
  Administered 2020-08-22 – 2020-08-23 (×4): 25 mg via ORAL
  Filled 2020-08-22 (×4): qty 1

## 2020-08-22 MED ORDER — POLYETHYLENE GLYCOL 3350 17 G PO PACK
17.0000 g | PACK | Freq: Every day | ORAL | Status: DC
  Administered 2020-08-22 – 2020-08-24 (×3): 17 g via ORAL
  Filled 2020-08-22 (×4): qty 1

## 2020-08-22 MED ORDER — PANTOPRAZOLE SODIUM 40 MG PO TBEC
40.0000 mg | DELAYED_RELEASE_TABLET | Freq: Every day | ORAL | Status: DC
  Administered 2020-08-22 – 2020-08-26 (×5): 40 mg via ORAL
  Filled 2020-08-22 (×5): qty 1

## 2020-08-22 MED ORDER — TACROLIMUS 1 MG PO CAPS
1.0000 mg | ORAL_CAPSULE | Freq: Every day | ORAL | Status: DC
  Administered 2020-08-22 – 2020-08-25 (×4): 1 mg via ORAL
  Filled 2020-08-22 (×4): qty 1

## 2020-08-22 MED ORDER — METOPROLOL SUCCINATE ER 25 MG PO TB24: 12.5000 mg | ORAL_TABLET | Freq: Every day | ORAL | Status: DC

## 2020-08-22 MED ORDER — LIDOCAINE 5 % EX PTCH
1.0000 | MEDICATED_PATCH | CUTANEOUS | Status: DC
  Administered 2020-08-22 – 2020-08-25 (×4): 1 via TRANSDERMAL
  Filled 2020-08-22 (×4): qty 1

## 2020-08-22 MED ORDER — GABAPENTIN 100 MG PO CAPS
100.0000 mg | ORAL_CAPSULE | Freq: Every day | ORAL | Status: DC | PRN
  Administered 2020-08-23 – 2020-08-26 (×4): 100 mg via ORAL
  Filled 2020-08-22 (×4): qty 1

## 2020-08-22 MED ORDER — TACROLIMUS 0.5 MG PO CAPS: 0.5000 mg | ORAL_CAPSULE | ORAL | Status: DC

## 2020-08-22 MED ORDER — PREDNISONE 5 MG PO TABS
5.0000 mg | ORAL_TABLET | Freq: Every day | ORAL | Status: DC
  Administered 2020-08-22 – 2020-08-26 (×5): 5 mg via ORAL
  Filled 2020-08-22 (×5): qty 1

## 2020-08-22 MED ORDER — HYDROXYCHLOROQUINE SULFATE 200 MG PO TABS
200.0000 mg | ORAL_TABLET | Freq: Every day | ORAL | Status: DC
  Administered 2020-08-22 – 2020-08-26 (×5): 200 mg via ORAL
  Filled 2020-08-22 (×6): qty 1

## 2020-08-22 MED ORDER — TACROLIMUS 0.5 MG PO CAPS
0.5000 mg | ORAL_CAPSULE | Freq: Every day | ORAL | Status: DC
  Administered 2020-08-22 – 2020-08-26 (×5): 0.5 mg via ORAL
  Filled 2020-08-22 (×6): qty 1

## 2020-08-22 NOTE — Consult Note (Signed)
Nephrology Consult   Requesting provider: Calvert Cantor, MD  Assessment/Recommendations:  Problem List: Acute on chronic systolic CHF exacerbation. EF 20-25%. 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 H/O Lupus nephritis & FSGS. With h/o recent AKI. BL Cr ~ mid-3 On chronic immunosuppression H/O Recent CMV viremia, on valcyte, off myfortic H/O recent Pseudomonas UTI H/o CAD s/p STEMI A fib HTN Pre-DM LUE AVF  Plan/Recommendations: -Cr seems to be around her new baseline which is around mid-3's -Resume home immunosuppression: tacrolimus 0.5mg  qam + 1mg  qpm, prednisone 5mg  daily. Continue to hold myfortic in light of recent CMV viremia -Will check tacrolimus trough in AM, needs to be drawn 30 min prior to AM dose. Goal FK levels 3-5 -monitoring kidney close in light of diuresis, would accept a degree of Cr rise while receiving aggressive diuresis -likely not having a LN flare, if suspecting this then will need UA w/ microscopy, UPC, UACR, C3, C4, anti-dsDNA -If Cr worsens then will need the aforementioned labs along with a transplant u/s -Immunosuppression Management: High Risk Medical Decision Making For Drug Therapy Requiring Intensive Monitoring For Toxicity I/S levels: pending Will continue intensive monitoring for drug levels and toxicity via regularly scheduled laboratory assays given the narrow therapeutic index of the immunosuppressive drug therapy listed above -Avoid nephrotoxic medications including NSAIDs and iodinated intravenous contrast exposure unless the latter is absolutely indicated.  Preferred narcotic agents for pain control are hydromorphone, fentanyl, and methadone. Morphine should not be used. Avoid Baclofen and avoid oral sodium phosphate and magnesium citrate based laxatives / bowel preps. Continue strict Input and Output monitoring. Will monitor the patient closely with you and intervene or adjust therapy as indicated by  changes in clinical status/labs   04/2018, MD [facility]   _____________________________________________________________________________________   History of Present Illness: Jenna [name] is a/an 65 y.o. female with a past medical history of CKD/DD KT 2020 (induction [place]), history of lupus nephritis/FSGS, chronic immunosuppressive therapy, HFrEF, CAD status post MI, TIA, A. fib, hypertension, prediabetes, recent CMV viremia, recent Pseudomonas UTI, recent AKI, LUE AVF who presents to [facility] with worsening shortness of breath and leg swelling for about 3 days.  Associated with orthopnea and dyspnea on exertion.  She has been taking torsemide 40 mg twice daily since 6/27 but there has been no improvement in her symptoms.  She was recently admitted in May for acute on chronic CHF exacerbation and sepsis and transferred to [facility] due to issues with her graft function, peak Cr then was around 3.7.  She mainly gets her care from [facility] and does follow with Dr. June at our office as well. She was admitted recently from 4/16-5/16 for CMV viremia and Pseudomonas UTI for which she received ganciclovir and cefepime respectively.  During this time, she had severe AKI with peak creatinine around 6.  Since then her creatinine has been ranging around mid 3's. She has been off myfortic in light of CMV viremia. She currently still reports dyspnea especially with exertion and RLE pain due to swelling. Otherwise denies any fevers, chills, chest pain, pain over her allograft site, hematuria, dysuria. She reports that her RLE pain does not feel like a lupus flare as that is usually full body pain.  Medications:  Current Facility-Administered Medications  Medication Dose Route Frequency Provider Last Rate Last Admin   0.9 %  sodium chloride infusion  250 mL Intravenous PRN [facility], MD  acetaminophen (TYLENOL) tablet 650 mg  650 mg Oral Q6H PRN Orma Flaming, MD    650 mg at 08/22/20 0740   Or   acetaminophen (TYLENOL) suppository 650 mg  650 mg Rectal Q6H PRN Orma Flaming, MD       aspirin EC tablet 81 mg  81 mg Oral q morning Orma Flaming, MD   81 mg at 08/22/20 1002   furosemide (LASIX) injection 80 mg  80 mg Intravenous BID Lelon Perla, MD   80 mg at 08/22/20 0950   gabapentin (NEURONTIN) capsule 100 mg  100 mg Oral Daily PRN Orma Flaming, MD       heparin injection 5,000 Units  5,000 Units Subcutaneous Q8H Orma Flaming, MD       hydrALAZINE (APRESOLINE) tablet 25 mg  25 mg Oral Q8H Lelon Perla, MD   25 mg at 08/22/20 1002   hydroxychloroquine (PLAQUENIL) tablet 200 mg  200 mg Oral Daily Orma Flaming, MD   200 mg at 08/22/20 0949   isosorbide mononitrate (IMDUR) 24 hr tablet 30 mg  30 mg Oral Daily Lelon Perla, MD   30 mg at 08/22/20 0950   lidocaine (LIDODERM) 5 % 1 patch  1 patch Transdermal Q24H Orma Flaming, MD   1 patch at 08/22/20 0741   nitroGLYCERIN (NITROSTAT) SL tablet 0.4 mg  0.4 mg Sublingual Q5 min PRN Orma Flaming, MD       pantoprazole (PROTONIX) EC tablet 40 mg  40 mg Oral Daily Orma Flaming, MD   40 mg at 08/22/20 0950   predniSONE (DELTASONE) tablet 5 mg  5 mg Oral Q lunch Orma Flaming, MD       rosuvastatin (CRESTOR) tablet 5 mg  5 mg Oral q1800 Orma Flaming, MD       sodium chloride flush (NS) 0.9 % injection 3 mL  3 mL Intravenous Q12H Orma Flaming, MD   3 mL at 08/21/20 2228   sodium chloride flush (NS) 0.9 % injection 3 mL  3 mL Intravenous PRN Orma Flaming, MD   3 mL at 08/22/20 0950   tacrolimus (PROGRAF) capsule 0.5 mg  0.5 mg Oral Daily Orma Flaming, MD   0.5 mg at 08/22/20 0949   tacrolimus (PROGRAF) capsule 1 mg  1 mg Oral QHS Orma Flaming, MD       valGANciclovir (VALCYTE) 450 MG tablet TABS 450 mg  450 mg Oral Auburn Bilberry, MD   450 mg at 08/22/20 2025     ALLERGIES Enalapril maleate, Penicillins, Chocolate, and Tape  MEDICAL HISTORY Past Medical History:   Diagnosis Date   Anemia    Anxiety    panic attack- talks herself and takes deep breathes   CAD (coronary artery disease)    a. STEMI 10/2018 s/p DES to LAD.   Depression    Dyspnea    with exertion    ESRD (end stage renal disease) ([place])    hemo TTHSAT   Essential hypertension    FSGS (focal segmental glomerulosclerosis) 2011   By renal biopsy   Head injury    age 4   HFrEF (heart failure with reduced ejection fraction) ([place])    History of kidney stones    kidney stone   Hx of lupus nephritis 2011   by renal biopsy   Ischemic cardiomyopathy    Kidney transplant recipient    LA thrombus 10/2018   Lupus (systemic lupus erythematosus) ([place])    followed by Dr. 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 remote parietal infarct on brain MRI 12/2019.   Pericarditis    age 67ish   Renal stone    Secondary hyperparathyroidism of renal origin ([place])    SLE (systemic lupus erythematosus) ([name])    TIA (transient ischemic attack)    no residual effects     SOCIAL HISTORY Social History   Socioeconomic History   Marital status: Legally Separated    Spouse name: Not on file   Number of children: 1   Years of education: Not on file   Highest education level: Not on file  Occupational History   Not on file  Tobacco Use   Smoking status: Never   Smokeless tobacco: Never  Vaping Use   Vaping Use: Never used  Substance and Sexual Activity   Alcohol use: No   Drug use: No   Sexual activity: Not Currently  Other Topics Concern   Not on file  Social History Narrative   Right Handed   Lives in a two story home   Daughter recently got married    Social Determinants of Health   Financial Resource Strain: Low Risk    Difficulty of Paying Living Expenses: Not very hard  Food Insecurity: No Food Insecurity   Worried About Charity fundraiser in the Last Year: Never true   [place]in[place] in the Last Year: Never true  Transportation Needs: No Transportation Needs   Lack of Transportation (Medical): No   Lack of Transportation (Non-Medical): No  Physical Activity: Sufficiently Active   Days of Exercise per Week: 5 days   Minutes of Exercise per Session: 30 min  Stress: Stress Concern Present   Feeling of Stress : To some extent  Social Connections: Moderately Isolated   Frequency of Communication with Friends and Family: More than three times a week   Frequency of Social Gatherings with Friends and Family: Once a week   Attends Religious Services: 1 to 4 times per year   Active Member of [employer] or Organizations: No   Attends Archivist Meetings: Never   Marital Status: Separated  Human resources officer Violence: Not At Risk   Fear of Current or Ex-Partner: No   Emotionally Abused: No   Physically Abused: No   Sexually Abused: No     FAMILY HISTORY Family History  Problem Relation Age of Onset   Diabetes Mother    Hypertension Father    Cancer Sister    Hypertension Brother      Review of Systems: 12 systems reviewed Otherwise as per HPI, all other systems reviewed and negative  Physical Exam: Vitals:   08/22/20 0426 08/22/20 0746  BP: (!) 153/79 (!) 147/77  Pulse: 95 91  Resp: 16 19  Temp: 98.7 F (37.1 C) 98.5 F (36.9 C)  SpO2: 100% 100%   Total I/O In: 120 [P.O.:120] Out: -   Intake/Output Summary (Last 24 hours) at 08/22/2020 1037 Last data filed at 08/22/2020 0844 Gross per 24 hour  Intake 360 ml  Output 900 ml  Net -540 ml   General: well-appearing, no acute distress, sitting up at the edge of the bed HEENT: anicteric sclera, oropharynx clear without lesions CV: regular rate, normal rhythm Lungs: fine crackles bibasilar, bl chest expansion Abd: soft, non-tender, non-distended Skin: no visible lesions or rashes Musculoskeletal: 1+ edema b/l LE's Neuro: normal speech, no gross focal deficits  HD access: LUE AVF w/ +b/t  Test  Results Reviewed Lab Results  Component Value Date   NA 137 08/22/2020   K 4.1 08/22/2020   CL 104 08/22/2020   CO2 20 (L) 08/22/2020   BUN 58 (H) 08/22/2020   CREATININE 3.30 (H) 08/22/2020   GFR 13.58 (LL) 07/30/2020   CALCIUM 9.4 08/22/2020   ALBUMIN 2.7 (L) 07/12/2020   PHOS 4.0 07/12/2020     I have reviewed all relevant outside healthcare records related to the patient's kidney injury.

## 2020-08-22 NOTE — Plan of Care (Signed)
  Problem: Elimination: Goal: Will not experience complications related to bowel motility Outcome: Progressing   

## 2020-08-22 NOTE — Progress Notes (Signed)
Progress Note  Patient Name: CALVARY DIFRANCO Date of Encounter: 08/22/2020  [facility] Cardiologist: Dr Sallyanne Kuster  Subjective   Complains of dyspnea; no CP  Inpatient Medications    Scheduled Meds:  aspirin EC  81 mg Oral q morning   furosemide  80 mg Intravenous Daily   heparin  5,000 Units Subcutaneous Q8H   hydroxychloroquine  200 mg Oral Daily   lidocaine  1 patch Transdermal Q24H   metoprolol succinate  12.5 mg Oral Daily   pantoprazole  40 mg Oral Daily   predniSONE  5 mg Oral Q lunch   rosuvastatin  5 mg Oral q1800   sodium chloride flush  3 mL Intravenous Q12H   tacrolimus  0.5 mg Oral Daily   tacrolimus  1 mg Oral QHS   valGANciclovir  450 mg Oral QODAY   Continuous Infusions:  sodium chloride     PRN Meds: sodium chloride, acetaminophen **OR** acetaminophen, gabapentin, nitroGLYCERIN, sodium chloride flush   Vital Signs    Vitals:   08/21/20 1841 08/21/20 2113 08/22/20 0002 08/22/20 0426  BP: (!) 152/80 (!) 143/65 (!) 153/77 (!) 153/79  Pulse: 84 89 92 95  Resp: $Remo'20 18 18 16  'GNWvG$ Temp:  (!) 97.5 F (36.4 C) 98.4 F (36.9 C) 98.7 F (37.1 C)  TempSrc:  Oral Oral Oral  SpO2: 96% 100% (!) 75% 100%  Weight:   91.9 kg     Intake/Output Summary (Last 24 hours) at 08/22/2020 0746 Last data filed at 08/22/2020 0433 Gross per 24 hour  Intake 240 ml  Output 900 ml  Net -660 ml   Last 3 Weights 08/22/2020 07/30/2020 07/12/2020  Weight (lbs) 202 lb 9.6 oz 196 lb 9.6 oz 211 lb 6.7 oz  Weight (kg) 91.899 kg 89.177 kg 95.9 kg      Telemetry    Sinus with PVCs - Personally Reviewed  Physical Exam   GEN: No acute distress.   Neck: supple Cardiac: RRR Respiratory: mild basilar crackles GI: Soft, nontender, non-distended  MS: 1+ edema Neuro:  Nonfocal  Psych: Normal affect   Labs    High Sensitivity Troponin:   Recent Labs  Lab 08/21/20 1630 08/21/20 1830  TROPONINIHS 121* 110*      Chemistry Recent Labs  Lab 08/21/20 1053 08/22/20 0542  NA  134* 137  K 3.8 4.1  CL 102 104  CO2 23 20*  GLUCOSE 103* 83  BUN 64* 58*  CREATININE 3.43* 3.30*  CALCIUM 9.1 9.4  GFRNONAA 14* 15*  ANIONGAP 9 13     Hematology Recent Labs  Lab 08/21/20 1053 08/22/20 0542  WBC 2.7* 3.4*  RBC 2.90* 3.14*  HGB 8.6* 9.3*  HCT 28.5* 29.8*  MCV 98.3 94.9  MCH 29.7 29.6  MCHC 30.2 31.2  RDW 19.1* 19.0*  PLT 204 205    BNP Recent Labs  Lab 08/21/20 1053  BNP >4,500.0*      Radiology    DG Chest 2 View  Result Date: 08/21/2020 CLINICAL DATA:  Shortness of breath EXAM: CHEST - 2 VIEW COMPARISON:  Radiograph 07/12/2020 FINDINGS: Unchanged, enlarged cardiac silhouette. There is pulmonary vascular congestion with small bilateral pleural effusions and bibasilar atelectasis. No visible pneumothorax. No acute osseous abnormality. IMPRESSION: Cardiomegaly with pulmonary vascular congestion, small bilateral pleural effusions and adjacent bibasilar atelectasis. Electronically Signed   By: Maurine Simmering   On: 08/21/2020 11:41    Patient Profile     65 y.o. female with past medical history of lupus  nephritis status post renal transplant, hypertension, coronary artery disease with history of PCI of the LAD, paroxysmal atrial fibrillation, CMV, ischemic cardiomyopathy, CVA admitted with acute on chronic systolic congestive heart failure.  Last echocardiogram at [facility] July 2022 showed ejection fraction 20 to 25% which had decreased from 35% previously.  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  Assessment & Plan    1 acute on chronic systolic congestive heart failure-patient remains volume overloaded.  We will treat with Lasix 80 mg IV twice daily.  Follow renal function closely.  2 ischemic cardiomyopathy-we will add hydralazine/nitrates.  No ARB or Entresto in the  setting of significant renal insufficiency.  We will discontinue Toprol for now in the setting of acute systolic heart failure and resume as her CHF improves.  3 chronic stage IV kidney disease/status post transplant-needs nephrology to follow.  4 history of paroxysmal atrial fibrillation-patient remains in sinus rhythm.  Would resume apixaban 5 mg twice daily at discharge.  5 coronary artery disease-resume statin.  Would discontinue aspirin at discharge when apixaban reinitiated.  For questions or updates, please contact [place] Please consult [email or web address] for contact info under        Signed, Kirk Ruths, MD  08/22/2020, 7:46 AM

## 2020-08-22 NOTE — Progress Notes (Signed)
PROGRESS NOTE    Jenna [name]   OBS:[id number]  DOB: 12/05/1955  DOA: 08/21/2020 PCP: [name], Betty G, MD   Brief Narrative:  Jenna [name] a 65 y.o. 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  She has orthopnea and was unable to walk as far as she typically does.  She has been taking torsemide 40 mg twice a day since 6/27 (which is double her usual dose) without any improvement. She was recently admitted on 5/29 through 6/7 for acute on chronic congestive heart failure and AKI.   Subjective: She states her shortness of breath is coming and going and even talking is making her short of breath.  She has been having pain in her right foot and leg which feels like a knife.    Assessment & Plan:   Principal Problem:   Acute on chronic systolic CHF (congestive heart failure) ([name]) Ischemic cardiomyopathy -EF 20 to 25% -Currently on Lasix 80 mg twice daily-cardiology and nephrology following Active Problems:  CMV viremia Lupus nephritis - On valganciclovir for 50 mg every other day  Status post renal transplant in 2020 on chronic immunosuppressive therapy - On tacrolimus, prednisone, hydroxychloroquine - holding Myfortic being held due to CMV viremia - Appreciate nephrology following -Creatinine has been stable in the 3-4 range    Paroxysmal atrial fibrillation  -Cardiology recommending to resume a apixaban 5 mg twice daily on discharge -Not on rate controlling agents  Mild leukopenia and anemia -WBC 3.2 on 6/15-about the same today which is new compared to prior labs -Globin has been ranging from 8-9  Time spent in minutes: 35 DVT prophylaxis: heparin injection 5,000 Units Start: 08/21/20  2200  Code Status: Full code Family Communication:  Level of Care: Level of care: Telemetry Medical Disposition Plan:  Status is: Inpatient  Remains inpatient appropriate because:IV treatments appropriate due to intensity of illness or inability to take PO  Dispo: The patient is from: Home              Anticipated d/c is to: Home              Patient currently is not medically stable to d/c.   Difficult to place patient No      Consultants:  Nephrology Cardiology Procedures:   Antimicrobials:  Anti-infectives (From admission, onward)    Start     Dose/Rate Route Frequency Ordered Stop   08/22/20 1000  hydroxychloroquine (PLAQUENIL) tablet 200 mg        200 mg Oral Daily 08/22/20 0553     08/22/20 1000  valGANciclovir (VALCYTE) 450 MG tablet TABS 450 mg        450 mg Oral Every other day 08/22/20 0553          Objective: Vitals:   08/22/20 0002 08/22/20 0426 08/22/20 0746 08/22/20 1242  BP: (!) 153/77 (!) 153/79 (!) 147/77 138/74  Pulse: 92 95 91 74  Resp: $Remo'18 16 19 19  'cpUtP$ Temp: 98.4 F (36.9 C) 98.7 F (37.1 C) 98.5 F (36.9 C) 97.6 F (36.4 C)  TempSrc: Oral Oral Oral Oral  SpO2: (!) 75% 100% 100% 100%  Weight: 91.9 kg       Intake/Output Summary (Last 24 hours) at 08/22/2020 1652 Last data  filed at 08/22/2020 1255 Gross per 24 hour  Intake 478 ml  Output 1400 ml  Net -922 ml   Filed Weights   08/22/20 0002  Weight: 91.9 kg    Examination: General exam: Appears uncomfortable-very irritable with me Respiratory system: Breath sounds at bases-respiratory effort normal. Cardiovascular system: S1 & S2 heard, RRR.   Gastrointestinal system: Abdomen soft, non-tender, nondistended. Normal bowel sounds. Central nervous system: Alert and oriented. No focal neurological deficits. Extremities: No cyanosis, clubbing or edema-tender and right anterior foot and shin-patient states this is secondary to edema Skin: No rashes or ulcers Psychiatry: Angry, flat  affect    Data Reviewed: I have personally reviewed following labs and imaging studies  CBC: Recent Labs  Lab 08/21/20 1053 08/22/20 0542  WBC 2.7* 3.4*  NEUTROABS 1.7  --   HGB 8.6* 9.3*  HCT 28.5* 29.8*  MCV 98.3 94.9  PLT 204 845   Basic Metabolic Panel: Recent Labs  Lab 08/21/20 1053 08/21/20 1830 08/22/20 0542  NA 134*  --  137  K 3.8  --  4.1  CL 102  --  104  CO2 23  --  20*  GLUCOSE 103*  --  83  BUN 64*  --  58*  CREATININE 3.43*  --  3.30*  CALCIUM 9.1  --  9.4  MG  --  2.1 2.0   GFR: Estimated Creatinine Clearance: 20 mL/min (A) (by C-G formula based on SCr of 3.3 mg/dL (H)). Liver Function Tests: No results for input(s): AST, ALT, ALKPHOS, BILITOT, PROT, ALBUMIN in the last 168 hours. No results for input(s): LIPASE, AMYLASE in the last 168 hours. No results for input(s): AMMONIA in the last 168 hours. Coagulation Profile: No results for input(s): INR, PROTIME in the last 168 hours. Cardiac Enzymes: No results for input(s): CKTOTAL, CKMB, CKMBINDEX, TROPONINI in the last 168 hours. BNP (last 3 results) No results for input(s): PROBNP in the last 8760 hours. HbA1C: No results for input(s): HGBA1C in the last 72 hours. CBG: No results for input(s): GLUCAP in the last 168 hours. Lipid Profile: No results for input(s): CHOL, HDL, LDLCALC, TRIG, CHOLHDL, LDLDIRECT in the last 72 hours. Thyroid Function Tests: No results for input(s): TSH, T4TOTAL, FREET4, T3FREE, THYROIDAB in the last 72 hours. Anemia Panel: No results for input(s): VITAMINB12, FOLATE, FERRITIN, TIBC, IRON, RETICCTPCT in the last 72 hours. Urine analysis:    Component Value Date/Time   COLORURINE YELLOW 07/12/2020 1037   APPEARANCEUR CLOUDY (A) 07/12/2020 1037   LABSPEC 1.013 07/12/2020 1037   PHURINE 5.0 07/12/2020 1037   GLUCOSEU NEGATIVE 07/12/2020 1037   HGBUR SMALL (A) 07/12/2020 1037   BILIRUBINUR NEGATIVE 07/12/2020 1037   BILIRUBINUR neg 12/19/2014 1157   KETONESUR  NEGATIVE 07/12/2020 1037   PROTEINUR >=300 (A) 07/12/2020 1037   UROBILINOGEN 0.2 12/19/2014 1157   UROBILINOGEN 0.2 05/23/2014 1301   NITRITE NEGATIVE 07/12/2020 1037   LEUKOCYTESUR LARGE (A) 07/12/2020 1037   Sepsis Labs: $RemoveBefo'@LABRCNTIP'gyLHlMfFKzC$ (procalcitonin:4,lacticidven:4) ) Recent Results (from the past 240 hour(s))  Resp Panel by RT-PCR (Flu A&B, Covid) Nasopharyngeal Swab     Status: None   Collection Time: 08/21/20  5:08 PM   Specimen: Nasopharyngeal Swab; Nasopharyngeal(NP) swabs in vial transport medium  Result Value Ref Range Status   SARS Coronavirus 2 by RT PCR NEGATIVE NEGATIVE Final    Comment: (NOTE) SARS-CoV-2 target nucleic acids are NOT DETECTED.  The SARS-CoV-2 RNA is generally detectable in upper respiratory specimens during the acute phase of infection. The  lowest concentration of SARS-CoV-2 viral copies this assay can detect is 138 copies/mL. A negative result does not preclude SARS-Cov-2 infection and should not be used as the sole basis for treatment or other patient management decisions. A negative result may occur with  improper specimen collection/handling, submission of specimen other than nasopharyngeal swab, presence of viral mutation(s) within the areas targeted by this assay, and inadequate number of viral copies(<138 copies/mL). A negative result must be combined with clinical observations, patient history, and epidemiological information. The expected result is Negative.  Fact Sheet for Patients:  EntrepreneurPulse.com.au  Fact Sheet for Healthcare Providers:  IncredibleEmployment.be  This test is no t yet approved or cleared by the [place] FDA and  has been authorized for detection and/or diagnosis of SARS-CoV-2 by FDA under an Emergency Use Authorization (EUA). This EUA will remain  in effect (meaning this test can be used) for the duration of the COVID-19 declaration under Section 564(b)(1) of the Act,  21 U.S.C.section 360bbb-3(b)(1), unless the authorization is terminated  or revoked sooner.       Influenza A by PCR NEGATIVE NEGATIVE Final   Influenza B by PCR NEGATIVE NEGATIVE Final    Comment: (NOTE) The Xpert Xpress SARS-CoV-2/FLU/RSV plus assay is intended as an aid in the diagnosis of influenza from Nasopharyngeal swab specimens and should not be used as a sole basis for treatment. Nasal washings and aspirates are unacceptable for Xpert Xpress SARS-CoV-2/FLU/RSV testing.  Fact Sheet for Patients: EntrepreneurPulse.com.au  Fact Sheet for Healthcare Providers: IncredibleEmployment.be  This test is not yet approved or cleared by the [place] FDA and has been authorized for detection and/or diagnosis of SARS-CoV-2 by FDA under an Emergency Use Authorization (EUA). This EUA will remain in effect (meaning this test can be used) for the duration of the COVID-19 declaration under Section 564(b)(1) of the Act, 21 U.S.C. section 360bbb-3(b)(1), unless the authorization is terminated or revoked.  Performed at [facility], [place] [address], [place], [place] [address]          Radiology Studies: DG Chest 2 View  Result Date: 08/21/2020 CLINICAL DATA:  Shortness of breath EXAM: CHEST - 2 VIEW COMPARISON:  Radiograph 07/12/2020 FINDINGS: Unchanged, enlarged cardiac silhouette. There is pulmonary vascular congestion with small bilateral pleural effusions and bibasilar atelectasis. No visible pneumothorax. No acute osseous abnormality. IMPRESSION: Cardiomegaly with pulmonary vascular congestion, small bilateral pleural effusions and adjacent bibasilar atelectasis. Electronically Signed   By: Maurine Simmering   On: 08/21/2020 11:41      Scheduled Meds:  aspirin EC  81 mg Oral q morning   furosemide  80 mg Intravenous BID   heparin  5,000 Units Subcutaneous Q8H   hydrALAZINE  25 mg Oral Q8H   hydroxychloroquine  200 mg Oral Daily    isosorbide mononitrate  30 mg Oral Daily   lidocaine  1 patch Transdermal Q24H   pantoprazole  40 mg Oral Daily   polyethylene glycol  17 g Oral Daily   predniSONE  5 mg Oral Q lunch   rosuvastatin  5 mg Oral q1800   sodium chloride flush  3 mL Intravenous Q12H   tacrolimus  0.5 mg Oral Daily   tacrolimus  1 mg Oral QHS   valGANciclovir  450 mg Oral QODAY   Continuous Infusions:  sodium chloride       LOS: 1 day      Debbe Odea, MD [facility] Pager: [email or web address] 08/22/2020, 4:52 PM

## 2020-08-23 LAB — BASIC METABOLIC PANEL
Anion gap: 8 (ref 5–15)
BUN: 56 mg/dL — ABNORMAL HIGH (ref 8–23)
CO2: 21 mmol/L — ABNORMAL LOW (ref 22–32)
Calcium: 9 mg/dL (ref 8.9–10.3)
Chloride: 107 mmol/L (ref 98–111)
Creatinine, Ser: 3.18 mg/dL — ABNORMAL HIGH (ref 0.44–1.00)
GFR, Estimated: 16 mL/min — ABNORMAL LOW (ref >60)
Glucose, Bld: 113 mg/dL — ABNORMAL HIGH (ref 70–99)
Potassium: 3.8 mmol/L (ref 3.5–5.1)
Sodium: 136 mmol/L (ref 135–145)

## 2020-08-23 LAB — MAGNESIUM: Magnesium: 2.1 mg/dL (ref 1.7–2.4)

## 2020-08-23 MED ORDER — ISOSORB DINITRATE-HYDRALAZINE 20-37.5 MG PO TABS
1.0000 | ORAL_TABLET | Freq: Three times a day (TID) | ORAL | Status: DC
  Administered 2020-08-23 – 2020-08-26 (×10): 1 via ORAL
  Filled 2020-08-23 (×10): qty 1

## 2020-08-23 MED ORDER — ALPRAZOLAM 0.25 MG PO TABS
0.2500 mg | ORAL_TABLET | Freq: Once | ORAL | Status: AC
  Administered 2020-08-23: 0.25 mg via ORAL
  Filled 2020-08-23: qty 1

## 2020-08-23 MED ORDER — SALINE SPRAY 0.65 % NA SOLN
1.0000 | NASAL | Status: DC | PRN
  Filled 2020-08-23: qty 44

## 2020-08-23 MED ORDER — FUROSEMIDE 10 MG/ML IJ SOLN
80.0000 mg | Freq: Two times a day (BID) | INTRAMUSCULAR | Status: DC
  Administered 2020-08-23 – 2020-08-24 (×3): 80 mg via INTRAVENOUS
  Filled 2020-08-23 (×2): qty 8

## 2020-08-23 NOTE — Progress Notes (Signed)
Progress Note  Patient Name: Jenna [name] Date of Encounter: 08/23/2020  [facility] Cardiologist: None Norman Clay, [facility])  Subjective   Dyspnea better, still mildly orthopneic. No angina. Net diuresis 900 ml/24h; 1.6 l since admission. Weight down 1.4 kg. BP remains high. Creat w improving trend.  Inpatient Medications    Scheduled Meds:  aspirin EC  81 mg Oral q morning   furosemide  80 mg Intravenous BID   heparin  5,000 Units Subcutaneous Q8H   hydrALAZINE  25 mg Oral Q8H   hydroxychloroquine  200 mg Oral Daily   isosorbide mononitrate  30 mg Oral Daily   lidocaine  1 patch Transdermal Q24H   pantoprazole  40 mg Oral Daily   polyethylene glycol  17 g Oral Daily   predniSONE  5 mg Oral Q lunch   rosuvastatin  5 mg Oral q1800   sodium chloride flush  3 mL Intravenous Q12H   tacrolimus  0.5 mg Oral Daily   tacrolimus  1 mg Oral QHS   valGANciclovir  450 mg Oral QODAY   Continuous Infusions:  sodium chloride     PRN Meds: sodium chloride, acetaminophen **OR** acetaminophen, gabapentin, nitroGLYCERIN, sodium chloride flush   Vital Signs    Vitals:   08/22/20 2019 08/22/20 2358 08/23/20 0315 08/23/20 0825  BP: (!) 150/95 (!) 165/91 (!) 159/90 (!) 174/91  Pulse: 87 88 89 90  Resp: $Remo'19 19 19 18  'bivEa$ Temp: 98.1 F (36.7 C) (!) 97.3 F (36.3 C) 97.8 F (36.6 C)   TempSrc: Oral Oral Oral   SpO2: 100% 100% 100% 100%  Weight:   90.5 kg     Intake/Output Summary (Last 24 hours) at 08/23/2020 0957 Last data filed at 08/23/2020 0824 Gross per 24 hour  Intake 359 ml  Output 1400 ml  Net -1041 ml   Last 3 Weights 08/23/2020 08/22/2020 07/30/2020  Weight (lbs) 199 lb 8 oz 202 lb 9.6 oz 196 lb 9.6 oz  Weight (kg) 90.493 kg 91.899 kg 89.177 kg      Telemetry    SR w monomorphic PVCs, rare 3-beat NSVT - Personally Reviewed  ECG    No new tracing - Personally Reviewed  Physical Exam   GEN: No acute distress.   Neck: 6-8 cm JVD Cardiac: RRR, no murmurs, rubs, S3  present Respiratory: Clear to auscultation bilaterally. GI: Soft, nontender, non-distended  MS: 2-3+ symmetrical calf and pedal edema; No deformity. Neuro:  Nonfocal  Psych: Normal affect   Labs    High Sensitivity Troponin:   Recent Labs  Lab 08/21/20 1630 08/21/20 1830  TROPONINIHS 121* 110*      Chemistry Recent Labs  Lab 08/21/20 1053 08/22/20 0542 08/23/20 0149  NA 134* 137 136  K 3.8 4.1 3.8  CL 102 104 107  CO2 23 20* 21*  GLUCOSE 103* 83 113*  BUN 64* 58* 56*  CREATININE 3.43* 3.30* 3.18*  CALCIUM 9.1 9.4 9.0  GFRNONAA 14* 15* 16*  ANIONGAP $RemoveB'9 13 8     'HUWFMEBd$ Hematology Recent Labs  Lab 08/21/20 1053 08/22/20 0542  WBC 2.7* 3.4*  RBC 2.90* 3.14*  HGB 8.6* 9.3*  HCT 28.5* 29.8*  MCV 98.3 94.9  MCH 29.7 29.6  MCHC 30.2 31.2  RDW 19.1* 19.0*  PLT 204 205    BNP Recent Labs  Lab 08/21/20 1053  BNP >4,500.0*     DDimer No results for input(s): DDIMER in the last 168 hours.   Radiology    DG Chest  2 View  Result Date: 08/21/2020 CLINICAL DATA:  Shortness of breath EXAM: CHEST - 2 VIEW COMPARISON:  Radiograph 07/12/2020 FINDINGS: Unchanged, enlarged cardiac silhouette. There is pulmonary vascular congestion with small bilateral pleural effusions and bibasilar atelectasis. No visible pneumothorax. No acute osseous abnormality. IMPRESSION: Cardiomegaly with pulmonary vascular congestion, small bilateral pleural effusions and adjacent bibasilar atelectasis. Electronically Signed   By: Maurine Simmering   On: 08/21/2020 11:41    Cardiac Studies   Last echocardiogram at [facility] July 2022 showed ejection fraction 20 to 25% which had decreased from 35% previously.  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  Patient Profile     65 y.o. female with past medical history of lupus nephritis  status post renal transplant, hypertension, coronary artery disease with history of PCI of the LAD, paroxysmal atrial fibrillation, CMV, ischemic cardiomyopathy, CVA admitted with acute on chronic systolic congestive heart failure.  Last echocardiogram at [facility] July 2022 showed new drop in EF from 35% to 20-25%.    Assessment & Plan      acute on chronic systolic congestive heart failure- slow improvement.  Avoid rapid diuresis due to renal disease.  Continue will treat with Lasix 80 mg IV twice daily.  Follow renal function closely. Switch to Bidil. Off Toprol for now in the setting of acute systolic heart failure and resume as her CHF improves.   2. chronic stage IV kidney disease/status post transplant-needs nephrology to follow. Creatinine is showing improvement w diuresis   3. history of paroxysmal atrial fibrillation-patient remains in sinus rhythm.  Would resume apixaban 5 mg twice daily at discharge.  4. coronary artery disease - on statin. No angina. Will need ischemia evaluation once HF compensated, to see if that is the cause for drop in EF,  but she is a suboptimal candidate for angiography. Would discontinue aspirin at discharge when apixaban reinitiated.     For questions or updates, please contact [place] Please consult [email or web address] for contact info under        Signed, [name], MD  08/23/2020, 9:57 AM

## 2020-08-23 NOTE — Progress Notes (Signed)
[facility] Progress Note    Assessment/ Plan:   Problem List: Acute on chronic systolic CHF exacerbation. EF 20-25%. Diuresis: lasix 80mg  IV BID Rt foot pain H/o ischemic cardiomyopathy CKD4, h/o DDKT 03/2018. H/O Lupus nephritis & FSGS. With h/o recent AKI. BL Cr ~ mid-3 On chronic immunosuppression H/O Recent CMV viremia, on valcyte, off myfortic H/O recent Pseudomonas UTI H/o CAD s/p STEMI A fib HTN Pre-DM LUE AVF   Plan/Recommendations: -Cr seems to be around her new baseline which is around mid-3's -c/w home immunosuppression: tacrolimus 0.5mg  qam + 1mg  qpm, prednisone 5mg  daily. Continue to hold myfortic in light of recent CMV viremia -Recheck CMV PCR in AM -Will reattempt to check a tacrolimus trough in AM, needs to be drawn 30 min prior to AM dose -Check uric acid, consider imaging her of her right foot and back (will defer to primary service) -monitoring kidney close in light of diuresis, would accept a degree of Cr rise while receiving aggressive diuresis. -likely not having a LN flare, if suspecting this then will need UA w/ microscopy, UPC, UACR, C3, C4, anti-dsDNA -If Cr worsens then will need the aforementioned labs along with a transplant u/s -cardio on board -Immunosuppression Management: High Risk Medical Decision Making For Drug Therapy Requiring Intensive Monitoring For Toxicity I/S levels: pending, repeat order for this AM and tomorrow AM Will continue intensive monitoring for drug levels and toxicity via regularly scheduled laboratory assays given the narrow therapeutic index of the immunosuppressive drug therapy listed above. Goal FK level 3-5 -Avoid nephrotoxic medications including NSAIDs and iodinated intravenous contrast exposure unless the latter is absolutely indicated.  Preferred narcotic agents for pain control are hydromorphone, fentanyl, and methadone. Morphine should not be used. Avoid Baclofen and avoid oral sodium phosphate and  magnesium citrate based laxatives / bowel preps. Continue strict Input and Output monitoring. Will monitor the patient closely with you and intervene or adjust therapy as indicated by changes in clinical status/labs   Gean Quint, MD [place]:   Patient exhibiting a lot of anxiety this am. She reports that she had a rough night. Labs were drawn in the middle of the night which she is upset about (rightfully so since her prograf levels were inappropriately drawn despite instructions). She reports that she received a pill overnight for her BP and since then she has not been able to sleep and having a lot of anxiety. From what I gather based on her med history, she received tylenol and gabapentin. She otherwise reports that her breathing is better and swelling is down. Reports right foot pain and left back pain (ongoing issues). Uop 1.4L   Objective:   BP (!) 174/91 (BP Location: Right Wrist)   Pulse 90   Temp 97.8 F (36.6 C) (Oral)   Resp 18   Wt 90.5 kg   SpO2 100%   BMI 31.25 kg/m   Intake/Output Summary (Last 24 hours) at 08/23/2020 0853 Last data filed at 08/23/2020 3612 Gross per 24 hour  Intake 359 ml  Output 1400 ml  Net -1041 ml   Weight change: -1.406 kg  Physical Exam: AES:LPNPYYF, tearful CVS:s1s2, rrr Resp:fine crackles bibasilar, speaking in full sent on RA RTM:YTRZ, nt/nd NBV:APOLI pititng edema bilateral ankles Neuro: awake, alert, no tremors, speech clear and coherent  Imaging: DG Chest 2 View  Result Date: 08/21/2020 CLINICAL DATA:  Shortness of breath EXAM: CHEST - 2 VIEW COMPARISON:  Radiograph 07/12/2020 FINDINGS: Unchanged, enlarged cardiac silhouette. There is  pulmonary vascular congestion with small bilateral pleural effusions and bibasilar atelectasis. No visible pneumothorax. No acute osseous abnormality. IMPRESSION: Cardiomegaly with pulmonary vascular congestion, small bilateral pleural effusions and adjacent bibasilar  atelectasis. Electronically Signed   By: Maurine Simmering   On: 08/21/2020 11:41    Labs: BMET Recent Labs  Lab 08/21/20 1053 08/22/20 0542 08/23/20 0149  NA 134* 137 136  K 3.8 4.1 3.8  CL 102 104 107  CO2 23 20* 21*  GLUCOSE 103* 83 113*  BUN 64* 58* 56*  CREATININE 3.43* 3.30* 3.18*  CALCIUM 9.1 9.4 9.0   CBC Recent Labs  Lab 08/21/20 1053 08/22/20 0542  WBC 2.7* 3.4*  NEUTROABS 1.7  --   HGB 8.6* 9.3*  HCT 28.5* 29.8*  MCV 98.3 94.9  PLT 204 205    Medications:     aspirin EC  81 mg Oral q morning   furosemide  80 mg Intravenous BID   heparin  5,000 Units Subcutaneous Q8H   hydrALAZINE  25 mg Oral Q8H   hydroxychloroquine  200 mg Oral Daily   isosorbide mononitrate  30 mg Oral Daily   lidocaine  1 patch Transdermal Q24H   pantoprazole  40 mg Oral Daily   polyethylene glycol  17 g Oral Daily   predniSONE  5 mg Oral Q lunch   rosuvastatin  5 mg Oral q1800   sodium chloride flush  3 mL Intravenous Q12H   tacrolimus  0.5 mg Oral Daily   tacrolimus  1 mg Oral QHS   valGANciclovir  450 mg Oral QODAY      Gean Quint, MD [facility] 08/23/2020, 8:53 AM

## 2020-08-23 NOTE — Progress Notes (Signed)
PT. C/o CP 10/10, mid chest. VS and EKG obtained. Nitro given x1. CP improved to 2/10. On call for [facility] paged to make aware. New orders received. Pt. Then stated she believes she's having anxiety/panic attack. MD made aware.

## 2020-08-23 NOTE — Progress Notes (Signed)
PROGRESS NOTE    Jenna [name]   OZY:[id number]  DOB: January 06, 1956  DOA: 08/21/2020 PCP: [name], Betty G, MD   Brief Narrative:  Jenna [name] a 65 y.o. 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  She has orthopnea and was unable to walk as far as she typically does.  She has been taking torsemide 40 mg twice a day since 6/27 (which is double her usual dose) without any improvement. She was recently admitted on 5/29 through 6/7 for acute on chronic congestive heart failure and AKI.   Subjective: She has no new complaints.     Assessment & Plan:   Principal Problem:   Acute on chronic systolic CHF (congestive heart failure) ([name]) Ischemic cardiomyopathy -EF 20 to 25% -Currently on Lasix 80 mg twice daily-cardiology and nephrology following - Cr slightly better  Active Problems:  CMV viremia Lupus nephritis - On valganciclovir for 50 mg every other day  Status post renal transplant in 2020 on chronic immunosuppressive therapy - On tacrolimus, prednisone, hydroxychloroquine -  Myfortic being held due to CMV viremia - Appreciate nephrology following -Creatinine has been stable in the 3-4 range    Paroxysmal atrial fibrillation  -Cardiology recommending to resume a apixaban 5 mg twice daily on discharge -Not on rate controlling agents  Mild leukopenia and anemia -WBC 3.2 on 6/15-about the same today which is new compared to prior labs -Hb has been ranging from 8-9  Time spent in minutes: 35 DVT prophylaxis: heparin injection 5,000 Units Start: 08/21/20 2200  Code Status: Full code Family Communication:  Level of Care: Level of care: Telemetry Medical Disposition Plan:  Status is:  Inpatient  Remains inpatient appropriate because:IV treatments appropriate due to intensity of illness or inability to take PO  Dispo: The patient is from: Home              Anticipated d/c is to: Home              Patient currently is not medically stable to d/c.   Difficult to place patient No      Consultants:  Nephrology Cardiology Procedures:   Antimicrobials:  Anti-infectives (From admission, onward)    Start     Dose/Rate Route Frequency Ordered Stop   08/22/20 1000  hydroxychloroquine (PLAQUENIL) tablet 200 mg        200 mg Oral Daily 08/22/20 0553     08/22/20 1000  valGANciclovir (VALCYTE) 450 MG tablet TABS 450 mg        450 mg Oral Every other day 08/22/20 0553          Objective: Vitals:   08/22/20 2019 08/22/20 2358 08/23/20 0315 08/23/20 0825  BP: (!) 150/95 (!) 165/91 (!) 159/90 (!) 174/91  Pulse: 87 88 89 90  Resp: $Remo'19 19 19 18  'WHyUS$ Temp: 98.1 F (36.7 C) (!) 97.3 F (36.3 C) 97.8 F (36.6 C)   TempSrc: Oral Oral Oral   SpO2: 100% 100% 100% 100%  Weight:   90.5 kg     Intake/Output Summary (Last 24 hours) at 08/23/2020 1340 Last data filed at 08/23/2020 0824 Gross per 24 hour  Intake 241 ml  Output 900 ml  Net -659 ml    [employer]  08/22/20 0002 08/23/20 0315  Weight: 91.9 kg 90.5 kg    Examination: General exam: Appears comfortable - sleeping when I entered the room- very irritable with me again today  Difficult to examine her as she will not allow it. I was able to examine her feet - b/l pedal edema note  Data Reviewed: I have personally reviewed following labs and imaging studies  CBC: Recent Labs  Lab 08/21/20 1053 08/22/20 0542  WBC 2.7* 3.4*  NEUTROABS 1.7  --   HGB 8.6* 9.3*  HCT 28.5* 29.8*  MCV 98.3 94.9  PLT 204 573    Basic Metabolic Panel: Recent Labs  Lab 08/21/20 1053 08/21/20 1830 08/22/20 0542 08/23/20 0149  NA 134*  --  137 136  K 3.8  --  4.1 3.8  CL 102  --  104 107  CO2 23  --  20* 21*  GLUCOSE  103*  --  83 113*  BUN 64*  --  58* 56*  CREATININE 3.43*  --  3.30* 3.18*  CALCIUM 9.1  --  9.4 9.0  MG  --  2.1 2.0 2.1    GFR: Estimated Creatinine Clearance: 20.7 mL/min (A) (by C-G formula based on SCr of 3.18 mg/dL (H)). Liver Function Tests: No results for input(s): AST, ALT, ALKPHOS, BILITOT, PROT, ALBUMIN in the last 168 hours. No results for input(s): LIPASE, AMYLASE in the last 168 hours. No results for input(s): AMMONIA in the last 168 hours. Coagulation Profile: No results for input(s): INR, PROTIME in the last 168 hours. Cardiac Enzymes: No results for input(s): CKTOTAL, CKMB, CKMBINDEX, TROPONINI in the last 168 hours. BNP (last 3 results) No results for input(s): PROBNP in the last 8760 hours. HbA1C: No results for input(s): HGBA1C in the last 72 hours. CBG: No results for input(s): GLUCAP in the last 168 hours. Lipid Profile: No results for input(s): CHOL, HDL, LDLCALC, TRIG, CHOLHDL, LDLDIRECT in the last 72 hours. Thyroid Function Tests: No results for input(s): TSH, T4TOTAL, FREET4, T3FREE, THYROIDAB in the last 72 hours. Anemia Panel: No results for input(s): VITAMINB12, FOLATE, FERRITIN, TIBC, IRON, RETICCTPCT in the last 72 hours. Urine analysis:    Component Value Date/Time   COLORURINE YELLOW 07/12/2020 1037   APPEARANCEUR CLOUDY (A) 07/12/2020 1037   LABSPEC 1.013 07/12/2020 1037   PHURINE 5.0 07/12/2020 1037   GLUCOSEU NEGATIVE 07/12/2020 1037   HGBUR SMALL (A) 07/12/2020 1037   BILIRUBINUR NEGATIVE 07/12/2020 1037   BILIRUBINUR neg 12/19/2014 1157   KETONESUR NEGATIVE 07/12/2020 1037   PROTEINUR >=300 (A) 07/12/2020 1037   UROBILINOGEN 0.2 12/19/2014 1157   UROBILINOGEN 0.2 05/23/2014 1301   NITRITE NEGATIVE 07/12/2020 1037   LEUKOCYTESUR LARGE (A) 07/12/2020 1037   Sepsis Labs: $RemoveBefo'@[email or web address]'[id number]$ (procalcitonin:4,lacticidven:4) ) Recent Results (from the past 240 hour(s))  Resp Panel by RT-PCR (Flu A&B, Covid) Nasopharyngeal Swab     Status: None    Collection Time: 08/21/20  5:08 PM   Specimen: Nasopharyngeal Swab; Nasopharyngeal(NP) swabs in vial transport medium  Result Value Ref Range Status   SARS Coronavirus 2 by RT PCR NEGATIVE NEGATIVE Final    Comment: (NOTE) SARS-CoV-2 target nucleic acids are NOT DETECTED.  The SARS-CoV-2 RNA is generally detectable in upper respiratory specimens during the acute phase of infection. The lowest concentration of SARS-CoV-2 viral copies this assay can detect is 138 copies/mL. A negative result does not preclude SARS-Cov-2 infection and should not be used as the sole basis for treatment or other patient management decisions. A negative result may  occur with  improper specimen collection/handling, submission of specimen other than nasopharyngeal swab, presence of viral mutation(s) within the areas targeted by this assay, and inadequate number of viral copies(<138 copies/mL). A negative result must be combined with clinical observations, patient history, and epidemiological information. The expected result is Negative.  Fact Sheet for Patients:  EntrepreneurPulse.com.au  Fact Sheet for Healthcare Providers:  IncredibleEmployment.be  This test is no t yet approved or cleared by the [place] FDA and  has been authorized for detection and/or diagnosis of SARS-CoV-2 by FDA under an Emergency Use Authorization (EUA). This EUA will remain  in effect (meaning this test can be used) for the duration of the COVID-19 declaration under Section 564(b)(1) of the Act, 21 U.S.C.section 360bbb-3(b)(1), unless the authorization is terminated  or revoked sooner.       Influenza A by PCR NEGATIVE NEGATIVE Final   Influenza B by PCR NEGATIVE NEGATIVE Final    Comment: (NOTE) The Xpert Xpress SARS-CoV-2/FLU/RSV plus assay is intended as an aid in the diagnosis of influenza from Nasopharyngeal swab specimens and should not be used as a sole basis for treatment.  Nasal washings and aspirates are unacceptable for Xpert Xpress SARS-CoV-2/FLU/RSV testing.  Fact Sheet for Patients: EntrepreneurPulse.com.au  Fact Sheet for Healthcare Providers: IncredibleEmployment.be  This test is not yet approved or cleared by the [place] FDA and has been authorized for detection and/or diagnosis of SARS-CoV-2 by FDA under an Emergency Use Authorization (EUA). This EUA will remain in effect (meaning this test can be used) for the duration of the COVID-19 declaration under Section 564(b)(1) of the Act, 21 U.S.C. section 360bbb-3(b)(1), unless the authorization is terminated or revoked.  Performed at [facility], [place] [address], [place], [place] [address]          Radiology Studies: No results found.    Scheduled Meds:  aspirin EC  81 mg Oral q morning   furosemide  80 mg Intravenous BID   heparin  5,000 Units Subcutaneous Q8H   hydroxychloroquine  200 mg Oral Daily   isosorbide-hydrALAZINE  1 tablet Oral TID   lidocaine  1 patch Transdermal Q24H   pantoprazole  40 mg Oral Daily   polyethylene glycol  17 g Oral Daily   predniSONE  5 mg Oral Q lunch   rosuvastatin  5 mg Oral q1800   sodium chloride flush  3 mL Intravenous Q12H   tacrolimus  0.5 mg Oral Daily   tacrolimus  1 mg Oral QHS   valGANciclovir  450 mg Oral QODAY   Continuous Infusions:  sodium chloride       LOS: 2 days      Debbe Odea, MD [facility] Pager: [email or web address] 08/23/2020, 1:40 PM

## 2020-08-24 DIAGNOSIS — I129 Hypertensive chronic kidney disease with stage 1 through stage 4 chronic kidney disease, or unspecified chronic kidney disease: Secondary | ICD-10-CM

## 2020-08-24 DIAGNOSIS — M3214 Glomerular disease in systemic lupus erythematosus: Secondary | ICD-10-CM

## 2020-08-24 DIAGNOSIS — I480 Paroxysmal atrial fibrillation: Secondary | ICD-10-CM

## 2020-08-24 DIAGNOSIS — Z940 Kidney transplant status: Secondary | ICD-10-CM

## 2020-08-24 DIAGNOSIS — N184 Chronic kidney disease, stage 4 (severe): Secondary | ICD-10-CM

## 2020-08-24 DIAGNOSIS — I2510 Atherosclerotic heart disease of native coronary artery without angina pectoris: Secondary | ICD-10-CM

## 2020-08-24 MED ORDER — TORSEMIDE 20 MG PO TABS
40.0000 mg | ORAL_TABLET | Freq: Once | ORAL | Status: AC
  Administered 2020-08-24: 40 mg via ORAL
  Filled 2020-08-24: qty 2

## 2020-08-24 NOTE — Progress Notes (Addendum)
Jenna [name] Progress Note    Assessment/ Plan:   Problem List: Acute on chronic systolic CHF exacerbation. EF 20-25%. Diuresis: lasix 80mg  IV BID Rt foot pain H/o ischemic cardiomyopathy CKD4, h/o DDKT 03/2018. H/O Lupus nephritis & FSGS. With h/o recent AKI. Possible CRS? BL Cr ~ mid-3 On chronic immunosuppression H/O Recent CMV viremia, on valcyte, off myfortic H/O recent Pseudomonas UTI H/o CAD s/p STEMI A fib HTN Pre-DM LUE AVF   Plan/Recommendations: -no labs today, will need access for labs (still able to get meds). Would be okay with a central line. Avoid picc. -She is requesting transfer to [facility], not sure if they will take her, discussed with primary service. She is doing better from a volume status, still has orthopnea. The other thought is if she would be okay for discharge to home. Primary to discuss with cardiology. Would be okay for discharge (or transfer) from a nephrology perspective. -Cr seems to be around her new baseline which is around mid-3's -c/w home immunosuppression: tacrolimus 0.5mg  qam + 1mg  qpm, prednisone 5mg  daily. Continue to hold myfortic in light of recent CMV viremia -CMV PCR needs to be drawn but okay with holding off for today given no access -tacrolimus trough drawn 7/10-pending, appropriately drawn -monitoring kidney close in light of diuresis (which is actually improving), would accept a degree of Cr rise while receiving aggressive diuresis. -likely not having a LN flare, if suspecting this then will need UA w/ microscopy, UPC, UACR, C3, C4, anti-dsDNA -If Cr worsens then will need the aforementioned labs along with a transplant u/s -cardio on board  -Immunosuppression Management: High Risk Medical Decision Making For Drug Therapy Requiring Intensive Monitoring For Toxicity I/S levels: pending, drawn 7/9 Will continue intensive monitoring for drug levels and toxicity via regularly scheduled laboratory assays given the narrow  therapeutic index of the immunosuppressive drug therapy listed above.  Goal FK level 3-5 -Avoid nephrotoxic medications including NSAIDs and iodinated intravenous contrast exposure unless the latter is absolutely indicated.  Preferred narcotic agents for pain control are hydromorphone, fentanyl, and methadone. Morphine should not be used. Avoid Baclofen and avoid oral sodium phosphate and magnesium citrate based laxatives / bowel preps. Continue strict Input and Output monitoring. Will monitor the patient closely with you and intervene or adjust therapy as indicated by changes in clinical status/labs   Jenna Quint, MD Jenna [name]  Subjective:   No iv access. Likely needs central line. No labs this am. Weight down to 89.9kg. uop 950cc charted. Net neg 2.5L since admit. Still endorses orthopnea. Patient very upset and anxious due to nursing issues. She is upset about not having IV access and being stuck multiple times. She is requesting to be transferred to [facility] where a majority of her care takes place.   Objective:   BP (!) 143/92 (BP Location: Right Arm)   Pulse 90   Temp 97.8 F (36.6 C) (Oral)   Resp 20   Wt 89.9 kg   SpO2 100%   BMI 31.03 kg/m   Intake/Output Summary (Last 24 hours) at 08/24/2020 1217 Last data filed at 08/24/2020 0930 Gross per 24 hour  Intake 760 ml  Output 950 ml  Net -190 ml   Weight change: -0.635 kg  Physical Exam: Gen: nad, sitting up in bed CVS:s1s2, rrr Resp:cta bl speaking in full sent on RA QIO:NGEX, nt/nd BMW:UXLKG pititng edema bilateral ankles (improved) Neuro: awake, alert, no tremors, speech clear and coherent  Imaging: No results found.  Labs: BMET  Recent Labs  Lab 08/21/20 1053 08/22/20 0542 08/23/20 0149  NA 134* 137 136  K 3.8 4.1 3.8  CL 102 104 107  CO2 23 20* 21*  GLUCOSE 103* 83 113*  BUN 64* 58* 56*  CREATININE 3.43* 3.30* 3.18*  CALCIUM 9.1 9.4 9.0   CBC Recent Labs  Lab 08/21/20 1053 08/22/20 0542   WBC 2.7* 3.4*  NEUTROABS 1.7  --   HGB 8.6* 9.3*  HCT 28.5* 29.8*  MCV 98.3 94.9  PLT 204 205    Medications:     aspirin EC  81 mg Oral q morning   furosemide  80 mg Intravenous BID   heparin  5,000 Units Subcutaneous Q8H   hydroxychloroquine  200 mg Oral Daily   isosorbide-hydrALAZINE  1 tablet Oral TID   lidocaine  1 patch Transdermal Q24H   pantoprazole  40 mg Oral Daily   polyethylene glycol  17 g Oral Daily   predniSONE  5 mg Oral Q lunch   rosuvastatin  5 mg Oral q1800   sodium chloride flush  3 mL Intravenous Q12H   tacrolimus  0.5 mg Oral Daily   tacrolimus  1 mg Oral QHS   valGANciclovir  450 mg Oral QODAY      Jenna Quint, MD [name] 08/24/2020, 12:17 PM

## 2020-08-24 NOTE — Progress Notes (Signed)
Progress Note  Patient Name: Jenna [name] Date of Encounter: 08/24/2020  [facility] Cardiologist: None Norman Clay, [facility])  Subjective   Upset that there have been 4 unsuccessful blood draw attempts today. Net diuresis zero last 24h per i/o, but weight down 1.5 lb (4.5 lb since admission). Labs pending. BP 130s/70s. Remains in NSR, PVC frequency appears to be less. A little frontal headache from Bidil, not intolerable. Mild dizziness after bending over.  Inpatient Medications    Scheduled Meds:  aspirin EC  81 mg Oral q morning   furosemide  80 mg Intravenous BID   heparin  5,000 Units Subcutaneous Q8H   hydroxychloroquine  200 mg Oral Daily   isosorbide-hydrALAZINE  1 tablet Oral TID   lidocaine  1 patch Transdermal Q24H   pantoprazole  40 mg Oral Daily   polyethylene glycol  17 g Oral Daily   predniSONE  5 mg Oral Q lunch   rosuvastatin  5 mg Oral q1800   sodium chloride flush  3 mL Intravenous Q12H   tacrolimus  0.5 mg Oral Daily   tacrolimus  1 mg Oral QHS   valGANciclovir  450 mg Oral QODAY   Continuous Infusions:  sodium chloride     PRN Meds: sodium chloride, acetaminophen **OR** acetaminophen, gabapentin, nitroGLYCERIN, sodium chloride, sodium chloride flush   Vital Signs    Vitals:   08/23/20 1851 08/23/20 2000 08/23/20 2018 08/24/20 0522  BP: 113/65 134/74 (!) 149/82 137/72  Pulse: 92 88 92 90  Resp:  20  18  Temp:  (!) 97.4 F (36.3 C)  98 F (36.7 C)  TempSrc:  Oral  Oral  SpO2:  100%  100%  Weight:    89.9 kg    Intake/Output Summary (Last 24 hours) at 08/24/2020 0851 Last data filed at 08/24/2020 0820 Gross per 24 hour  Intake 360 ml  Output 950 ml  Net -590 ml   Last 3 Weights 08/24/2020 08/23/2020 08/22/2020  Weight (lbs) 198 lb 1.6 oz 199 lb 8 oz 202 lb 9.6 oz  Weight (kg) 89.858 kg 90.493 kg 91.899 kg      Telemetry    SR w PVCs - Personally Reviewed  ECG    NSR w PACs and PVCs, nonspecific IVCD with left axis deviation -  Personally Reviewed  Physical Exam   GEN: No acute distress.   Neck: No JVD Cardiac: RRR, no murmurs, rubs, S3 faint Respiratory: Clear to auscultation bilaterally. GI: Soft, nontender, non-distended  MS: 2-3+ pedal edema; No deformity. Neuro:  Nonfocal  Psych: Normal affect   Labs    High Sensitivity Troponin:   Recent Labs  Lab 08/21/20 1630 08/21/20 1830  TROPONINIHS 121* 110*      Chemistry Recent Labs  Lab 08/21/20 1053 08/22/20 0542 08/23/20 0149  NA 134* 137 136  K 3.8 4.1 3.8  CL 102 104 107  CO2 23 20* 21*  GLUCOSE 103* 83 113*  BUN 64* 58* 56*  CREATININE 3.43* 3.30* 3.18*  CALCIUM 9.1 9.4 9.0  GFRNONAA 14* 15* 16*  ANIONGAP $RemoveB'9 13 8     'FkcGDUXS$ Hematology Recent Labs  Lab 08/21/20 1053 08/22/20 0542  WBC 2.7* 3.4*  RBC 2.90* 3.14*  HGB 8.6* 9.3*  HCT 28.5* 29.8*  MCV 98.3 94.9  MCH 29.7 29.6  MCHC 30.2 31.2  RDW 19.1* 19.0*  PLT 204 205    BNP Recent Labs  Lab 08/21/20 1053  BNP >4,500.0*     DDimer No results for input(s):  DDIMER in the last 168 hours.   Radiology    No results found.  Cardiac Studies   Last echocardiogram at [facility] July 2022 showed ejection fraction 20 to 25% which had decreased from 35% previously.  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  Patient Profile     65 y.o. female with past medical history of lupus nephritis status post renal transplant, hypertension, coronary artery disease with history of PCI of the LAD, paroxysmal atrial fibrillation, CMV, ischemic cardiomyopathy, CVA admitted with acute on chronic systolic congestive heart failure.  Last echocardiogram at [facility] July 2022 showed new drop in EF from 35% to 20-25%.  Assessment & Plan    acute on chronic systolic congestive heart failure- slow improvement.  Avoid rapid  diuresis due to renal disease.  Continue will treat with Lasix 80 mg IV twice daily.  Follow renal function closely. Tolerating Bidil. Off Toprol for now in the setting of acute systolic heart failure and resume as her CHF improves.   2. chronic stage IV kidney disease/status post transplant- Nephrology is seeing and evaluating adequacy of immunosuppression/screening for lupus flare/recent CMV viremia.  Creatinine showed slight improvement w diuresis so far.   3. history of paroxysmal atrial fibrillation-patient remains in sinus rhythm.  Anticoagulation seems to have been stopped inadvertently during her recent acute illness. Would resume apixaban 5 mg twice daily at discharge.   4. coronary artery disease - on statin. No angina. Will need ischemia evaluation once HF compensated, to see if that is the cause for drop in EF,  but she is a suboptimal candidate for angiography. May be best suited for a Lexiscan Myoview study, which will be best performed as an outpatient. Would discontinue aspirin at discharge when apixaban reinitiated.        For questions or updates, please contact [place] Please consult [email or web address] for contact info under        Signed, [name], MD  08/24/2020, 8:51 AM

## 2020-08-24 NOTE — Plan of Care (Signed)
  Problem: Clinical Measurements: Goal: Respiratory complications will improve Outcome: Progressing   Problem: Coping: Goal: Level of anxiety will decrease Outcome: Not Progressing

## 2020-08-24 NOTE — Progress Notes (Signed)
Pt. C/o pain at PIV site. PIV assessed. IV flushes well, however pt. C/o pain  with flush. No blood return noted. RN will d/c IV. New order placed to have PIV inserted.

## 2020-08-24 NOTE — Progress Notes (Addendum)
PROGRESS NOTE    Jenna [name]   PJR:[id number]  DOB: November 18, 1955  DOA: 08/21/2020 PCP: [place], Betty G, MD   Brief Narrative:  Jenna [name] a 65 y.o. 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  She has orthopnea and was unable to walk as far as she typically does.  She has been taking torsemide 40 mg twice a day since 6/27 (which is double her usual dose) without any improvement. She was recently admitted on 5/29 through 6/7 for acute on chronic congestive heart failure and AKI.  I was notified by the RN today that her IV is no longer working and lab has tried 3 times but is unable to obtain blood. RN is asking for a PICC line. As she is a potential dialysis patient, we are holding off on a PICC. Recommending a central line but patient asking to be transferred to [facility] instead.   Subjective: No new complaints. Asking for transfer to [facility] as mentioned above. Also wondering if she could go home and take Torsemide at home.     Assessment & Plan:   Principal Problem:   Acute on chronic systolic CHF (congestive heart failure) ([name]) Ischemic cardiomyopathy -EF 20 to 25% - Improving with Lasix 80 mg twice daily but continues to wake up with sudden onset dyspnea - cardiology and nephrology following - Cr slightly better- not a Lupus nephritis flare per nephrology - now has no IV access and she is hesitant to get a central line- transitioned to Demadex 40 mg- waiting to see if this will produce effective urine output and working on transfer to [facility] per patient's request  - Addendum: I checked with the RN, the patient voided twice after the Demedex- it was not measured either time.   Active  Problems:  CMV viremia Lupus nephritis - On valganciclovir for 50 mg every other day  Status post renal transplant in 2020 on chronic immunosuppressive therapy - On tacrolimus, prednisone, hydroxychloroquine -  Myfortic being held due to CMV viremia - Appreciate nephrology following - Tacrolimus level ordered but lab unable to get blood- can be done as outpt    Paroxysmal atrial fibrillation  -Cardiology recommending to resume a apixaban 5 mg twice daily on discharge -Not on rate controlling agents  Mild leukopenia and anemia -WBC 3.2 on 6/15-about the same today which is new compared to prior labs -Hb has been ranging from 8-9  Time spent in minutes: 35 DVT prophylaxis: heparin injection 5,000 Units Start: 08/21/20 2200  Code Status: Full code Family Communication:  Level of Care: Level of care: Telemetry Medical Disposition Plan:  Status is: Inpatient  Remains inpatient appropriate because:IV treatments appropriate due to intensity of illness or inability to take [name]  Dispo: The patient is from: Home              Anticipated d/c is to: Home              Patient currently is not medically stable to d/c.   Difficult to place patient No      Consultants:  Nephrology Cardiology Procedures:   Antimicrobials:  Anti-infectives (From admission, onward)    Start     Dose/Rate Route Frequency Ordered Stop   08/22/20 1000  hydroxychloroquine (PLAQUENIL) tablet 200 mg        200 mg Oral Daily 08/22/20 0553     08/22/20 1000  valGANciclovir (VALCYTE) 450 MG tablet TABS 450 mg        450 mg Oral Every other day 08/22/20 0553          Objective: Vitals:   08/23/20 2000 08/23/20 2018 08/24/20 0522 08/24/20 1200  BP: 134/74 (!) 149/82 137/72 (!) 143/92  Pulse: 88 92 90 90  Resp: $Remo'20  18 20  'WVApB$ Temp: (!) 97.4 F (36.3 C)  98 F (36.7 C) 97.8 F (36.6 C)  TempSrc: Oral  Oral Oral  SpO2: 100%  100% 100%  Weight:   89.9 kg     Intake/Output Summary (Last 24 hours) at  08/24/2020 1553 Last data filed at 08/24/2020 1438 Gross per 24 hour  Intake 1000 ml  Output 300 ml  Net 700 ml    Filed Weights   08/22/20 0002 08/23/20 0315 08/24/20 0522  Weight: 91.9 kg 90.5 kg 89.9 kg    Examination: General exam: Appears comfortable  HEENT: PERRLA, oral mucosa moist, no sclera icterus or thrush Respiratory system: poor breath sounds Cardiovascular system: S1 & S2 heard, regular rate and rhythm Gastrointestinal system: Abdomen soft, non-tender, nondistended. Normal bowel sounds   Central nervous system: Alert and oriented. No focal neurological deficits. Extremities: No cyanosis, clubbing - improving pedal edema Skin: No rashes or ulcers Psychiatry:  Mood & affect appropriate.    Data Reviewed: I have personally reviewed following labs and imaging studies  CBC: Recent Labs  Lab 08/21/20 1053 08/22/20 0542  WBC 2.7* 3.4*  NEUTROABS 1.7  --   HGB 8.6* 9.3*  HCT 28.5* 29.8*  MCV 98.3 94.9  PLT 204 160    Basic Metabolic Panel: Recent Labs  Lab 08/21/20 1053 08/21/20 1830 08/22/20 0542 08/23/20 0149  NA 134*  --  137 136  K 3.8  --  4.1 3.8  CL 102  --  104 107  CO2 23  --  20* 21*  GLUCOSE 103*  --  83 113*  BUN 64*  --  58* 56*  CREATININE 3.43*  --  3.30* 3.18*  CALCIUM 9.1  --  9.4 9.0  MG  --  2.1 2.0 2.1    GFR: Estimated Creatinine Clearance: 20.6 mL/min (A) (by C-G formula based on SCr of 3.18 mg/dL (H)). Liver Function Tests: No results for input(s): AST, ALT, ALKPHOS, BILITOT, PROT, ALBUMIN in the last 168 hours. No results for input(s): LIPASE, AMYLASE in the last 168 hours. No results for input(s): AMMONIA in the last 168 hours. Coagulation Profile: No results for input(s): INR, PROTIME in the last 168 hours. Cardiac Enzymes: No results for input(s): CKTOTAL, CKMB, CKMBINDEX, TROPONINI in the last 168 hours. BNP (last 3 results) No results for input(s): PROBNP in the last 8760 hours. HbA1C: No results for input(s):  HGBA1C in the last 72 hours. CBG: No results for input(s): GLUCAP in the last 168 hours. Lipid Profile: No results for input(s): CHOL, HDL, LDLCALC, TRIG, CHOLHDL, LDLDIRECT in the last 72 hours. Thyroid Function Tests: No results for input(s): TSH, T4TOTAL, FREET4, T3FREE, THYROIDAB in the last 72 hours. Anemia Panel: No results for input(s): VITAMINB12, FOLATE, FERRITIN, TIBC, IRON, RETICCTPCT in the last 72 hours. Urine analysis:    Component Value Date/Time   COLORURINE YELLOW 07/12/2020 1037   APPEARANCEUR CLOUDY (A) 07/12/2020 1037   LABSPEC 1.013 07/12/2020 1037   PHURINE 5.0 07/12/2020  [place] 07/12/2020 1037   HGBUR SMALL (A) 07/12/2020 1037   BILIRUBINUR NEGATIVE 07/12/2020 1037   BILIRUBINUR neg 12/19/2014 1157   [place] 07/12/2020 1037   PROTEINUR >=300 (A) 07/12/2020 1037   UROBILINOGEN 0.2 12/19/2014 1157   UROBILINOGEN 0.2 05/23/2014 1301   NITRITE NEGATIVE 07/12/2020 1037   LEUKOCYTESUR LARGE (A) 07/12/2020 1037   Sepsis Labs: $[email or web address]'[id number]$ (procalcitonin:4,lacticidven:4) ) Recent Results (from the past 240 hour(s))  Resp Panel by RT-PCR (Flu A&B, Covid) Nasopharyngeal Swab     Status: None   Collection Time: 08/21/20  5:08 PM   Specimen: Nasopharyngeal Swab; Nasopharyngeal(NP) swabs in vial transport medium  Result Value Ref Range Status   SARS Coronavirus 2 by RT PCR NEGATIVE NEGATIVE Final    Comment: (NOTE) SARS-CoV-2 target nucleic acids are NOT DETECTED.  The SARS-CoV-2 RNA is generally detectable in upper respiratory specimens during the acute phase of infection. The lowest concentration of SARS-CoV-2 viral copies this assay can detect is 138 copies/mL. A negative result does not preclude SARS-Cov-2 infection and should not be used as the sole basis for treatment or other patient management decisions. A negative result may occur with  improper specimen collection/handling, submission of specimen other than nasopharyngeal  swab, presence of viral mutation(s) within the areas targeted by this assay, and inadequate number of viral copies(<138 copies/mL). A negative result must be combined with clinical observations, patient history, and epidemiological information. The expected result is Negative.  Fact Sheet for Patients:  EntrepreneurPulse.com.au  Fact Sheet for Healthcare Providers:  IncredibleEmployment.be  This test is no t yet approved or cleared by the [place] FDA and  has been authorized for detection and/or diagnosis of SARS-CoV-2 by FDA under an Emergency Use Authorization (EUA). This EUA will remain  in effect (meaning this test can be used) for the duration of the COVID-19 declaration under Section 564(b)(1) of the Act, 21 U.S.C.section 360bbb-3(b)(1), unless the authorization is terminated  or revoked sooner.       Influenza A by PCR NEGATIVE NEGATIVE Final   Influenza B by PCR NEGATIVE NEGATIVE Final    Comment: (NOTE) The Xpert Xpress SARS-CoV-2/FLU/RSV plus assay is intended as an aid in the diagnosis of influenza from Nasopharyngeal swab specimens and should not be used as a sole basis for treatment. Nasal washings and aspirates are unacceptable for Xpert Xpress SARS-CoV-2/FLU/RSV testing.  Fact Sheet for Patients: EntrepreneurPulse.com.au  Fact Sheet for Healthcare Providers: IncredibleEmployment.be  This test is not yet approved or cleared by the [place] FDA and has been authorized for detection and/or diagnosis of SARS-CoV-2 by FDA under an Emergency Use Authorization (EUA). This EUA will remain in effect (meaning this test can be used) for the duration of the COVID-19 declaration under Section 564(b)(1) of the Act, 21 U.S.C. section 360bbb-3(b)(1), unless the authorization is terminated or revoked.  Performed at [facility], [place] [address], [place], [place] [address]           Radiology Studies: No results found.    Scheduled Meds:  aspirin EC  81 mg Oral q morning   heparin  5,000 Units Subcutaneous Q8H   hydroxychloroquine  200 mg Oral Daily   isosorbide-hydrALAZINE  1 tablet Oral TID   lidocaine  1 patch Transdermal Q24H   pantoprazole  40 mg Oral Daily   polyethylene glycol  17 g Oral Daily   predniSONE  5 mg Oral Q lunch   rosuvastatin  5 mg Oral q1800   sodium  chloride flush  3 mL Intravenous Q12H   tacrolimus  0.5 mg Oral Daily   tacrolimus  1 mg Oral QHS   valGANciclovir  450 mg Oral QODAY   Continuous Infusions:  sodium chloride       LOS: 3 days      Debbe Odea, MD [facility] Pager: [email or web address] 08/24/2020, 3:53 PM

## 2020-08-25 LAB — TACROLIMUS LEVEL
Tacrolimus (FK506) - LabCorp: 4.7 ng/mL (ref 2.0–20.0)
Tacrolimus (FK506) - LabCorp: 4.1 ng/mL (ref 2.0–20.0)

## 2020-08-25 MED ORDER — TORSEMIDE 20 MG PO TABS
40.0000 mg | ORAL_TABLET | Freq: Two times a day (BID) | ORAL | Status: DC
  Administered 2020-08-25 – 2020-08-26 (×2): 40 mg via ORAL
  Filled 2020-08-25 (×3): qty 2

## 2020-08-25 MED ORDER — LIDOCAINE 5 % EX PTCH
2.0000 | MEDICATED_PATCH | CUTANEOUS | Status: DC
  Administered 2020-08-26: 2 via TRANSDERMAL
  Filled 2020-08-25: qty 2

## 2020-08-25 MED ORDER — LIDOCAINE 5 % EX PTCH
1.0000 | MEDICATED_PATCH | CUTANEOUS | Status: AC
  Administered 2020-08-25: 1 via TRANSDERMAL
  Filled 2020-08-25: qty 1

## 2020-08-25 MED ORDER — METOPROLOL SUCCINATE ER 50 MG PO TB24
50.0000 mg | ORAL_TABLET | Freq: Every day | ORAL | Status: DC
  Administered 2020-08-25 – 2020-08-26 (×2): 50 mg via ORAL
  Filled 2020-08-25 (×2): qty 1

## 2020-08-25 MED ORDER — ALPRAZOLAM 0.25 MG PO TABS
0.2500 mg | ORAL_TABLET | Freq: Once | ORAL | Status: AC
  Administered 2020-08-26: 0.25 mg via ORAL
  Filled 2020-08-25: qty 1

## 2020-08-25 NOTE — Progress Notes (Addendum)
[facility] Progress Note    Assessment/ Plan:   Problem List: Acute on chronic systolic CHF exacerbation. EF 20-25%.  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 H/O Lupus nephritis & FSGS. With h/o recent AKI. Possible CRS. BL Cr ~ mid-3 On chronic immunosuppression H/O Recent CMV viremia, on valcyte, off myfortic H/O recent Pseudomonas UTI H/o CAD s/p STEMI A fib HTN Pre-DM LUE AVF   Plan/Recommendations: -again no labs today, will need access for labs (still able to get meds). She states she would be okay with a central line. Avoid picc. -She remains volume overloaded and will use torsemide 40 BID today but if in effective will need to resume IV or ^ dose -She has requested a transfer to [facility] and this process has been initiated -Cr seems to be around her new baseline which is around mid-3's -c/w home immunosuppression: tacrolimus 0.5mg  qam + 1mg  qpm, prednisone 5mg  daily. Continue to hold myfortic in light of recent CMV viremia -CMV PCR needs to be drawn when labs obtained -tacrolimus trough drawn 7/9-pending, appropriately drawn -monitoring kidney close in light of diuresis (which is actually improving), would accept a degree of Cr rise while receiving aggressive diuresis. -presentation not consistent with SLE flare -cardio on board  -Immunosuppression Management: High Risk Medical Decision Making For Drug Therapy Requiring Intensive Monitoring For Toxicity I/S levels: pending, drawn 7/9 Will continue intensive monitoring for drug levels and toxicity via regularly scheduled laboratory assays given the narrow therapeutic index of the immunosuppressive drug therapy listed above.  Goal FK level 3-5  -Avoid nephrotoxic medications including NSAIDs and iodinated intravenous contrast exposure unless the latter is absolutely indicated.  Preferred narcotic agents for pain control are hydromorphone, fentanyl, and methadone. Morphine should not be  used. Avoid Baclofen and avoid oral sodium phosphate and magnesium citrate based laxatives / bowel preps. Continue strict Input and Output monitoring. Will monitor the patient closely with you and intervene or adjust therapy as indicated by changes in clinical status/labs  Jannifer Hick MD [facility] Pager [phone]   Subjective:   C/o foot pain ongoing which she thinks is neuropathy + edema. I/Os yesterday 1.7 / 0.5L but per notes unmeasured voids occurred, net -1.3 for the admission; admit wt 91.9kg, yesterday 89.9 and today 90.1. BP 120-130s, O2 sat 99-100% RA.    Objective:   BP 137/63 (BP Location: Right Arm)   Pulse 92   Temp 98.6 F (37 C) (Oral)   Resp 16   Wt 90.1 kg   SpO2 99%   BMI 31.11 kg/m   Intake/Output Summary (Last 24 hours) at 08/25/2020 1039 Last data filed at 08/25/2020 0600 Gross per 24 hour  Intake 945 ml  Output 200 ml  Net 745 ml    Weight change: 0.227 kg  Physical Exam: Gen: nad, sitting up in bed  CVS:s1s2, rrr Resp: L basilar rales, R clear; normal WOB KKX:FGHW, nt/nd Ext:1+ pititng edema bilateral ankles; LUE AVF +t/b Neuro: awake, alert, no tremors, speech clear and coherent  Imaging: No results found.  Labs: BMET Recent Labs  Lab 08/21/20 1053 08/22/20 0542 08/23/20 0149  NA 134* 137 136  K 3.8 4.1 3.8  CL 102 104 107  CO2 23 20* 21*  GLUCOSE 103* 83 113*  BUN 64* 58* 56*  CREATININE 3.43* 3.30* 3.18*  CALCIUM 9.1 9.4 9.0    CBC Recent Labs  Lab 08/21/20 1053 08/22/20 0542  WBC 2.7* 3.4*  NEUTROABS 1.7  --  HGB 8.6* 9.3*  HCT 28.5* 29.8*  MCV 98.3 94.9  PLT 204 205     Medications:     aspirin EC  81 mg Oral q morning   heparin  5,000 Units Subcutaneous Q8H   hydroxychloroquine  200 mg Oral Daily   isosorbide-hydrALAZINE  1 tablet Oral TID   lidocaine  1 patch Transdermal Q24H   pantoprazole  40 mg Oral Daily   polyethylene glycol  17 g Oral Daily   predniSONE  5 mg Oral Q lunch    rosuvastatin  5 mg Oral q1800   sodium chloride flush  3 mL Intravenous Q12H   tacrolimus  0.5 mg Oral Daily   tacrolimus  1 mg Oral QHS   valGANciclovir  450 mg Oral QODAY    Jannifer Hick MD [facility] Pager [phone]

## 2020-08-25 NOTE — Progress Notes (Addendum)
PROGRESS NOTE    Jenna [name]   QMV:[id number]  DOB: 1955/06/03  DOA: 08/21/2020 PCP: [name], Betty G, MD   Brief Narrative:  Jenna [name] a 65 y.o. 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  She has orthopnea and was unable to walk as far as she typically does.  She has been taking torsemide 40 mg twice a day since 6/27 (which is double her usual dose) without any improvement. She was recently admitted on 5/29 through 6/7 for acute on chronic congestive heart failure and AKI.  I was notified by the RN today that her IV is no longer working and lab has tried 3 times but is unable to obtain blood. RN is asking for a PICC line. As she is a potential dialysis patient, we are holding off on a PICC. Recommending a central line but patient asking to be transferred to [facility] instead.   Subjective: No new complaints. Still intermittently short of breath when she lays flat. Feels better after sitting up.  [facility] has called me. She has a bed a [facility] but tells me she plans to stay here and not be transferred.     Assessment & Plan:   Principal Problem:   Acute on chronic systolic CHF (congestive heart failure) ([name]) Ischemic cardiomyopathy -EF 20 to 25% - Improving with Lasix 80 mg twice daily but continues to wake up with sudden onset dyspnea - cardiology and nephrology following - Cr slightly better- not a Lupus nephritis flare per nephrology - 7/10>  has no IV access and she is hesitant to get a central line- transitioned to Demadex 40 mg- voided 2x after this - mixed with stool and not measured-  working on transfer to [facility] per patient's request - 7/11- cont management per renal - on BID Demadex-     Active Problems:  CMV viremia Lupus nephritis - On valganciclovir for 50 mg every other day  Status post renal transplant in 2020 on chronic immunosuppressive therapy - On tacrolimus, prednisone, hydroxychloroquine -  Myfortic being held due to CMV viremia - Appreciate nephrology following - Tacrolimus level ordered but lab unable to get blood- can be done as outpt    Paroxysmal atrial fibrillation  -Cardiology recommending to resume a apixaban 5 mg twice daily on discharge -Not on rate controlling agents  Mild leukopenia and anemia -WBC 3.2 on 6/15-about the same today which is new compared to prior labs -Hb has been ranging from 8-9  Back pain - Left upper back and mid back- Has Lidocaine patch on left upper back, will place another on her mid back.  Time spent in minutes: 57- discussed plan with nephro, cardiology, RN- spoke with transfer center again today DVT prophylaxis: heparin injection 5,000 Units Start: 08/21/20 2200  Code Status: Full code Family Communication:  Level of Care: Level of care: Telemetry Medical Disposition Plan:  Status is: Inpatient  Remains inpatient appropriate because:IV treatments appropriate due to intensity of illness or inability to take PO  Dispo: The patient is from: Home              Anticipated d/c is to: Home              Patient currently is not medically stable  to d/c.   Difficult to place patient No   Consultants:  Nephrology Cardiology Procedures:   Antimicrobials:  Anti-infectives (From admission, onward)    Start     Dose/Rate Route Frequency Ordered Stop   08/22/20 1000  hydroxychloroquine (PLAQUENIL) tablet 200 mg        200 mg Oral Daily 08/22/20 0553     08/22/20 1000  valGANciclovir (VALCYTE) 450 MG tablet TABS 450 mg        450 mg Oral Every other day 08/22/20 0553          Objective: Vitals:   08/25/20 0341 08/25/20 0500 08/25/20 0811 08/25/20 1201  BP: 122/67  137/63 130/80  Pulse: 86  92 87   Resp: $Remo'19  16 17  'qcgeT$ Temp: 98.2 F (36.8 C)  98.6 F (37 C) 97.8 F (36.6 C)  TempSrc: Oral  Oral Oral  SpO2: 100%  99% 100%  Weight:  90.1 kg      Intake/Output Summary (Last 24 hours) at 08/25/2020 1310 Last data filed at 08/25/2020 1219 Gross per 24 hour  Intake 1185 ml  Output 200 ml  Net 985 ml    Filed Weights   08/23/20 0315 08/24/20 0522 08/25/20 0500  Weight: 90.5 kg 89.9 kg 90.1 kg    Examination: General exam: Appears comfortable  HEENT: PERRLA, oral mucosa moist, no sclera icterus or thrush Respiratory system: crackles in LLL- Respiratory effort normal. Cardiovascular system: S1 & S2 heard, regular rate and rhythm Gastrointestinal system: Abdomen soft, non-tender, nondistended. Normal bowel sounds   Central nervous system: Alert and oriented. No focal neurological deficits. MSK: Tender in LUQ and mid back Extremities: No cyanosis, clubbing - b/l pedal edema Skin: No rashes or ulcers Psychiatry:  Mood & affect appropriate.    Data Reviewed: I have personally reviewed following labs and imaging studies  CBC: Recent Labs  Lab 08/21/20 1053 08/22/20 0542  WBC 2.7* 3.4*  NEUTROABS 1.7  --   HGB 8.6* 9.3*  HCT 28.5* 29.8*  MCV 98.3 94.9  PLT 204 291    Basic Metabolic Panel: Recent Labs  Lab 08/21/20 1053 08/21/20 1830 08/22/20 0542 08/23/20 0149  NA 134*  --  137 136  K 3.8  --  4.1 3.8  CL 102  --  104 107  CO2 23  --  20* 21*  GLUCOSE 103*  --  83 113*  BUN 64*  --  58* 56*  CREATININE 3.43*  --  3.30* 3.18*  CALCIUM 9.1  --  9.4 9.0  MG  --  2.1 2.0 2.1    GFR: Estimated Creatinine Clearance: 20.6 mL/min (A) (by C-G formula based on SCr of 3.18 mg/dL (H)). Liver Function Tests: No results for input(s): AST, ALT, ALKPHOS, BILITOT, PROT, ALBUMIN in the last 168 hours. No results for input(s): LIPASE, AMYLASE in the last 168 hours. No results for input(s): AMMONIA in the last 168 hours. Coagulation Profile: No results for input(s): INR,  PROTIME in the last 168 hours. Cardiac Enzymes: No results for input(s): CKTOTAL, CKMB, CKMBINDEX, TROPONINI in the last 168 hours. BNP (last 3 results) No results for input(s): PROBNP in the last 8760 hours. HbA1C: No results for input(s): HGBA1C in the last 72 hours. CBG: No results for input(s): GLUCAP in the last 168 hours. Lipid Profile: No results for input(s): CHOL, HDL, LDLCALC, TRIG, CHOLHDL, LDLDIRECT in the last 72 hours. Thyroid Function Tests: No results for input(s): TSH, T4TOTAL, FREET4, T3FREE, THYROIDAB in the last 72 hours.  Anemia Panel: No results for input(s): VITAMINB12, FOLATE, FERRITIN, TIBC, IRON, RETICCTPCT in the last 72 hours. Urine analysis:    Component Value Date/Time   COLORURINE YELLOW 07/12/2020 1037   APPEARANCEUR CLOUDY (A) 07/12/2020 1037   LABSPEC 1.013 07/12/2020 1037   PHURINE 5.0 07/12/2020 1037   GLUCOSEU NEGATIVE 07/12/2020 1037   HGBUR SMALL (A) 07/12/2020 1037   BILIRUBINUR NEGATIVE 07/12/2020 1037   BILIRUBINUR neg 12/19/2014 1157   KETONESUR NEGATIVE 07/12/2020 1037   PROTEINUR >=300 (A) 07/12/2020 1037   UROBILINOGEN 0.2 12/19/2014 1157   UROBILINOGEN 0.2 05/23/2014 1301   NITRITE NEGATIVE 07/12/2020 1037   LEUKOCYTESUR LARGE (A) 07/12/2020 1037   Sepsis Labs: $RemoveBefo'@LABRCNTIP'sBEcUfrkMYL$ (procalcitonin:4,lacticidven:4) ) Recent Results (from the past 240 hour(s))  Resp Panel by RT-PCR (Flu A&B, Covid) Nasopharyngeal Swab     Status: None   Collection Time: 08/21/20  5:08 PM   Specimen: Nasopharyngeal Swab; Nasopharyngeal(NP) swabs in vial transport medium  Result Value Ref Range Status   SARS Coronavirus 2 by RT PCR NEGATIVE NEGATIVE Final    Comment: (NOTE) SARS-CoV-2 target nucleic acids are NOT DETECTED.  The SARS-CoV-2 RNA is generally detectable in upper respiratory specimens during the acute phase of infection. The lowest concentration of SARS-CoV-2 viral copies this assay can detect is 138 copies/mL. A negative result does not  preclude SARS-Cov-2 infection and should not be used as the sole basis for treatment or other patient management decisions. A negative result may occur with  improper specimen collection/handling, submission of specimen other than nasopharyngeal swab, presence of viral mutation(s) within the areas targeted by this assay, and inadequate number of viral copies(<138 copies/mL). A negative result must be combined with clinical observations, patient history, and epidemiological information. The expected result is Negative.  Fact Sheet for Patients:  EntrepreneurPulse.com.au  Fact Sheet for Healthcare Providers:  IncredibleEmployment.be  This test is no t yet approved or cleared by the [place] FDA and  has been authorized for detection and/or diagnosis of SARS-CoV-2 by FDA under an Emergency Use Authorization (EUA). This EUA will remain  in effect (meaning this test can be used) for the duration of the COVID-19 declaration under Section 564(b)(1) of the Act, 21 U.S.C.section 360bbb-3(b)(1), unless the authorization is terminated  or revoked sooner.       Influenza A by PCR NEGATIVE NEGATIVE Final   Influenza B by PCR NEGATIVE NEGATIVE Final    Comment: (NOTE) The Xpert Xpress SARS-CoV-2/FLU/RSV plus assay is intended as an aid in the diagnosis of influenza from Nasopharyngeal swab specimens and should not be used as a sole basis for treatment. Nasal washings and aspirates are unacceptable for Xpert Xpress SARS-CoV-2/FLU/RSV testing.  Fact Sheet for Patients: EntrepreneurPulse.com.au  Fact Sheet for Healthcare Providers: IncredibleEmployment.be  This test is not yet approved or cleared by the [place] FDA and has been authorized for detection and/or diagnosis of SARS-CoV-2 by FDA under an Emergency Use Authorization (EUA). This EUA will remain in effect (meaning this test can be used) for the  duration of the COVID-19 declaration under Section 564(b)(1) of the Act, 21 U.S.C. section 360bbb-3(b)(1), unless the authorization is terminated or revoked.  Performed at [facility], [place] [address] [place], [place] [address]          Radiology Studies: No results found.    Scheduled Meds:  aspirin EC  81 mg Oral q morning   heparin  5,000 Units Subcutaneous Q8H   hydroxychloroquine  200 mg Oral Daily   isosorbide-hydrALAZINE  1 tablet Oral TID   lidocaine  1 patch Transdermal Q24H   metoprolol succinate  50 mg Oral Daily   pantoprazole  40 mg Oral Daily   polyethylene glycol  17 g Oral Daily   predniSONE  5 mg Oral Q lunch   rosuvastatin  5 mg Oral q1800   sodium chloride flush  3 mL Intravenous Q12H   tacrolimus  0.5 mg Oral Daily   tacrolimus  1 mg Oral QHS   torsemide  40 mg Oral BID   valGANciclovir  450 mg Oral QODAY   Continuous Infusions:  sodium chloride       LOS: 4 days      Debbe Odea, MD [facility] Pager: [email or web address] 08/25/2020, 1:10 PM

## 2020-08-25 NOTE — Plan of Care (Signed)

## 2020-08-25 NOTE — Telephone Encounter (Signed)
I think this is prescribed by her nephrologist. Thanks, BJ

## 2020-08-25 NOTE — Progress Notes (Signed)
Progress Note  Patient Name: Jenna [name] Date of Encounter: 08/25/2020  [facility] Cardiologist: Norman Clay, [facility])  Subjective   Patient reports improved breathing since admit but not at baseline.  Still gets some shortness of breath with moving around in room.  Some orthopnea.  Very poor historian.  Now has IV access.  Unsuccessful attempt x2 this morning to draw blood.  Inpatient Medications    Scheduled Meds:  aspirin EC  81 mg Oral q morning   heparin  5,000 Units Subcutaneous Q8H   hydroxychloroquine  200 mg Oral Daily   isosorbide-hydrALAZINE  1 tablet Oral TID   lidocaine  1 patch Transdermal Q24H   pantoprazole  40 mg Oral Daily   polyethylene glycol  17 g Oral Daily   predniSONE  5 mg Oral Q lunch   rosuvastatin  5 mg Oral q1800   sodium chloride flush  3 mL Intravenous Q12H   tacrolimus  0.5 mg Oral Daily   tacrolimus  1 mg Oral QHS   valGANciclovir  450 mg Oral QODAY   Continuous Infusions:  sodium chloride     PRN Meds: sodium chloride, acetaminophen **OR** acetaminophen, gabapentin, nitroGLYCERIN, sodium chloride, sodium chloride flush   Vital Signs    Vitals:   08/24/20 1940 08/25/20 0341 08/25/20 0500 08/25/20 0811  BP: (!) 147/66 122/67  137/63  Pulse: 85 86  92  Resp: $Remo'19 19  16  'UMNOR$ Temp: 98.4 F (36.9 C) 98.2 F (36.8 C)  98.6 F (37 C)  TempSrc: Oral Oral  Oral  SpO2: 100% 100%  99%  Weight:   90.1 kg     Intake/Output Summary (Last 24 hours) at 08/25/2020 1017 Last data filed at 08/25/2020 0600 Gross per 24 hour  Intake 945 ml  Output 200 ml  Net 745 ml   Last 3 Weights 08/25/2020 08/24/2020 08/23/2020  Weight (lbs) 198 lb 9.6 oz 198 lb 1.6 oz 199 lb 8 oz  Weight (kg) 90.084 kg 89.858 kg 90.493 kg      Telemetry    Sinus rhythm with frequent PACs (some strip looks like afib but has P wave)- Personally Reviewed  ECG    N/A  Physical Exam   GEN: No acute distress.   Neck: No JVD Cardiac: RRR, no murmurs, rubs, or gallops.   Respiratory: Diminished breath sound on L side  GI: Soft, nontender, non-distended  MS: 2+ edema; No deformity. Neuro:  Nonfocal  Psych: Normal affect   Labs    High Sensitivity Troponin:   Recent Labs  Lab 08/21/20 1630 08/21/20 1830  TROPONINIHS 121* 110*      Chemistry Recent Labs  Lab 08/21/20 1053 08/22/20 0542 08/23/20 0149  NA 134* 137 136  K 3.8 4.1 3.8  CL 102 104 107  CO2 23 20* 21*  GLUCOSE 103* 83 113*  BUN 64* 58* 56*  CREATININE 3.43* 3.30* 3.18*  CALCIUM 9.1 9.4 9.0  GFRNONAA 14* 15* 16*  ANIONGAP $RemoveB'9 13 8     'BMfPLFpW$ Hematology Recent Labs  Lab 08/21/20 1053 08/22/20 0542  WBC 2.7* 3.4*  RBC 2.90* 3.14*  HGB 8.6* 9.3*  HCT 28.5* 29.8*  MCV 98.3 94.9  MCH 29.7 29.6  MCHC 30.2 31.2  RDW 19.1* 19.0*  PLT 204 205   BNP Recent Labs  Lab 08/21/20 1053  BNP >4,500.0*     Radiology    No results found.  Cardiac Studies   Echo 08/19/20 @ [place]  The left ventricular size is  normal.  There is normal left ventricular wall thickness.  Left ventricular systolic function is severely reduced.  LV ejection fraction = 20-25%.  Apical akinesis with aneurysmal changes. Anteroseptum and apical  septum akinetic. Moderate hypokinesis of other segments.  Left ventricular filling pattern is indeterminate.  The right ventricle is mildly dilated.  The right ventricular systolic function is mildly reduced.  The left atrium is moderately dilated.  The right atrium is moderately dilated.  Moderate mitral regurgitation with centrally directed jet likely in  setting of mitral annular dilation (diameter 4.0cm).  There is moderate to severe tricuspid regurgitation.  Estimated right ventricular systolic pressure is 52 mmHg.  Moderate pulmonary hypertension.  The inferior vena cava is moderately dilated with a decrease in  inspiratory collapse.  There is a pleural effusion present.  There is no pericardial effusion.  Probably no significant change in  comparison with the prior study  noted    Patient Profile     65 y.o. female  with past medical history of lupus nephritis status post renal transplant, hypertension, coronary artery disease with history of PCI of the LAD, paroxysmal atrial fibrillation, CMV, ischemic cardiomyopathy, CVA admitted with acute on chronic systolic congestive heart failure.   She was recently admitted on 5/29 through 6/7 for acute on chronic congestive heart failure and AKI.   Assessment & Plan    Acute on chronic systolic heart failure - Last echocardiogram at [facility] July 2022 showed new drop in EF from 35% to 20-25%.  Admitted with volume overload. - BNP > 4500 - Lost IV access over the weekend and unable to draw blood.  She was placed on torsemide but no output recorded initially. +1.2L in past 24 hours and -1.3L since admit.  Her breathing is improving.  Has lower extremity edema. - Added compression stocking - Now patient has IV access and the nurse will reattempt to draw blood - No ACE/ARB/ARNI due to CKD - Hard to manage diuretic if no renal function at baseline >> appreciate nephology recommendations given CKD  - Continue Bidil 20/37.5mg  TID - Toprol hold due to acute CHF  2. Acute on CKD stage IV/ s/p renal transplant -Kidney function minimally improved on last check -Hopefully successful blood draw at time today  3.  Paroxysmal atrial fibrillation -Telemetry looks like A. fib but clearly visible P wave -Maintaining sinus rhythm -Her anticoagulation was held previously, Dr. Sallyanne Kuster recommended resuming at discharge and stopping ASA  4.  CAD -Chest pain.  Recent drop in EF by outpatient echocardiogram at [facility] outpatient ischemic evaluation by primary cardiologist   For questions or updates, please contact [place] Please consult [email or web address] for contact info under        SignedLeanor Kail, PA  08/25/2020, 10:17 AM

## 2020-08-26 ENCOUNTER — Inpatient Hospital Stay (HOSPITAL_COMMUNITY): Payer: Medicare Other

## 2020-08-26 DIAGNOSIS — I5020 Unspecified systolic (congestive) heart failure: Secondary | ICD-10-CM

## 2020-08-26 DIAGNOSIS — B259 Cytomegaloviral disease, unspecified: Secondary | ICD-10-CM

## 2020-08-26 LAB — RENAL FUNCTION PANEL
Albumin: 2.8 g/dL — ABNORMAL LOW (ref 3.5–5.0)
Anion gap: 9 (ref 5–15)
BUN: 49 mg/dL — ABNORMAL HIGH (ref 8–23)
CO2: 23 mmol/L (ref 22–32)
Calcium: 8.7 mg/dL — ABNORMAL LOW (ref 8.9–10.3)
Chloride: 102 mmol/L (ref 98–111)
Creatinine, Ser: 3.05 mg/dL — ABNORMAL HIGH (ref 0.44–1.00)
GFR, Estimated: 17 mL/min — ABNORMAL LOW (ref >60)
Glucose, Bld: 84 mg/dL (ref 70–99)
Phosphorus: 4 mg/dL (ref 2.5–4.6)
Potassium: 4.4 mmol/L (ref 3.5–5.1)
Sodium: 134 mmol/L — ABNORMAL LOW (ref 135–145)

## 2020-08-26 LAB — MAGNESIUM: Magnesium: 2.1 mg/dL (ref 1.7–2.4)

## 2020-08-26 MED ORDER — LIDOCAINE HCL 1 % IJ SOLN
INTRAMUSCULAR | Status: AC
  Filled 2020-08-26: qty 20

## 2020-08-26 MED ORDER — ISOSORB DINITRATE-HYDRALAZINE 20-37.5 MG PO TABS: 1.0000 | ORAL_TABLET | Freq: Three times a day (TID) | ORAL | 0 refills | Status: DC

## 2020-08-26 MED ORDER — TORSEMIDE 40 MG PO TABS: 40.0000 mg | ORAL_TABLET | Freq: Two times a day (BID) | ORAL | 0 refills | Status: DC

## 2020-08-26 MED ORDER — METOPROLOL SUCCINATE ER 50 MG PO TB24: 50.0000 mg | ORAL_TABLET | Freq: Every day | ORAL | 0 refills | Status: DC

## 2020-08-26 NOTE — TOC Initial Note (Signed)
Transition of Care [facility]) - Initial/Assessment Note    Patient Details  Name: Jenna [name] MRN: [id number] Date of Birth: 1955-02-23  Transition of Care [facility]) CM/SW Contact:    Zenon Mayo, RN Phone Number: 08/26/2020, 5:13 PM  Clinical Narrative:                 Patient is for dc today, she will need a shower chair.  She is ok with [employer] supplying this for her.  She will also need a script from the MD for a scale to take to [place] [facility] to get the scale.  NCM notified MD to put on script for patient.  She has transportation home.   Expected Discharge Plan: Home/Self Care Barriers to Discharge: No Barriers Identified   Patient Goals and CMS Choice Patient states their goals for this hospitalization and ongoing recovery are:: return home   Choice offered to / list presented to : NA  Expected Discharge Plan and Services Expected Discharge Plan: Home/Self Care   Discharge Planning Services: CM Consult   Living arrangements for the past 2 months: Single Family Home Expected Discharge Date: 08/26/20               DME Arranged: Shower stool DME Agency: [employer] Date DME Agency Contacted: 08/26/20 Time DME Agency Contacted: 1 Representative spoke with at DME Agency: [place]: NA          Prior Living Arrangements/Services Living arrangements for the past 2 months: [place] Lives with:: Self Patient language and need for interpreter reviewed:: Yes Do you feel safe going back to the place where you live?: Yes      Need for Family Participation in Patient Care: No (Comment) Care giver support system in place?: No (comment)   Criminal Activity/Legal Involvement Pertinent to Current Situation/Hospitalization: No - Comment as needed  Activities of Daily Living      Permission Sought/Granted                  Emotional Assessment   Attitude/Demeanor/Rapport: Engaged Affect (typically observed): Appropriate Orientation: :  Oriented to Situation, Oriented to  Time, Oriented to Place, Oriented to Self Alcohol / Substance Use: Not Applicable Psych Involvement: No (comment)  Admission diagnosis:  Acute pulmonary edema ([name]) [J81.0] Acute exacerbation of CHF (congestive heart failure) ([place]) [I50.9] Acute on chronic congestive heart failure, unspecified heart failure type ([place]) [I50.9] Patient Active Problem List   Diagnosis Date Noted   Acute on chronic systolic CHF (congestive heart failure) ([place]) 07/11/2020   Acute respiratory failure with hypoxia ([place]) 07/11/2020   Cytomegalovirus (CMV) viremia ([place]) 07/11/2020   Elevated troponin 07/11/2020   Anemia of chronic disease 07/11/2020   Acute kidney injury superimposed on chronic kidney disease ([place]) 07/11/2020   Allergic rhinitis 01/01/2020   Anxiety disorder 06/26/2019   CAD (coronary artery disease) 2019-03-14   Paroxysmal atrial fibrillation ([place]) 03-14-19   Deceased-donor kidney transplant 03/27/2018   Dyslipidemia 11/06/2017   ESRD on dialysis ([place]) 06/27/2017   CKD (chronic kidney disease) stage 5, GFR less than 15 ml/min ([place]) 02/15/2017   Back pain at L4-L5 level 10/31/2014   Muscle spasm of back 10/31/2014   Visit for screening mammogram 09/30/2014   Chronic maxillary sinusitis 07/04/2014   Occipital headache 07/04/2014   Increased urinary frequency 05/23/2014   Falls 05/23/2014   Rash and nonspecific skin eruption 05/23/2014   Hypertension with renal disease 04/16/2014   Encounter for immunization 11/19/2013   Lupus (systemic  lupus erythematosus) ([place]) 09/24/2013   TIA (transient ischemic attack) 05/05/2013   Numbness and tingling of left side of face 05/04/2013   Lupus ([place]) 04/15/2011   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   PCP:  [name], Betty G, MD Pharmacy:   [place], [place] [facility] [address] [place] [address] Phone: [phone] Fax: [phone]  [id number] [address] [place] [address] Phone: [phone] Fax: [phone]     Social Determinants of Health (SDOH) Interventions    Readmission Risk Interventions Readmission Risk Prevention Plan 08/26/2020  Transportation Screening Complete  HRI or [place] Complete  Social Work Consult for [place] Planning/Counseling Complete  Palliative Care Screening Not Applicable  Medication Review Press photographer) Complete  Some recent data might be hidden

## 2020-08-26 NOTE — Progress Notes (Signed)
Heart Failure Navigator Progress Note  Assessed for Heart & Vascular TOC clinic readiness.  Unfortunately at this time the patient does not meet criteria due to advanced CKD - baseline SCr >3.   Navigator available for reassessment of patient.   Kerby Nora, PharmD, BCPS Heart Failure Stewardship Pharmacist Phone [phone]

## 2020-08-26 NOTE — Progress Notes (Signed)
Pt refusing to come down with transport for line placement. Pt states she does not want the line because she is going home.

## 2020-08-26 NOTE — Progress Notes (Signed)
   08/26/20 1300  Clinical Encounter Type  Visited With Patient  Visit Type Spiritual support;Social support  Referral From Nurse  Consult/Referral To Chaplain  Spiritual Encounters  Spiritual Needs Emotional  The chaplain responded to spiritual consult for emotional support because the patient is feeling overwhelmed. The chaplain spoke with the patient about her day. The patient said she is feeling okay. She is hoping to be discharged today depending on blood work. The patient spoke about her dogs at home. The patient stated that she does not open up fast to individuals she does not know. The chaplain provided social support and listened to the patient. The chaplain will follow up as needed.

## 2020-08-26 NOTE — Care Management Important Message (Signed)
Important Message  Patient Details  Name: MELEANE SELINGER MRN: [id number] Date of Birth: 10-Mar-1955   Medicare Important Message Given:  Yes     [name] 08/26/2020, 9:03 AM

## 2020-08-26 NOTE — Discharge Summary (Addendum)
Physician Discharge Summary  Jenna [name] RVU:[id number] DOB: [birth year] DOA: 08/21/2020  PCP: [name], Betty G, MD  Admit date: 08/21/2020 Discharge date: 08/26/2020  Admitted From: home  Disposition:  home   Recommendations for Outpatient Follow-up:  Nephrology to f/u on renal function  Home Health:  none  Discharge Condition:  stable   CODE STATUS:  full code   Consultations: Nephrology cardiology  Procedures/Studies:    Discharge Diagnoses:  Principal Problem:   Acute on chronic systolic CHF (congestive heart failure) ([place]) Active Problems:   TIA (transient ischemic attack)   Lupus (systemic lupus erythematosus) ([place])   Hypertension with renal disease   Dyslipidemia   CAD (coronary artery disease)   Paroxysmal atrial fibrillation ([name])   Cytomegalovirus (CMV) viremia ([place])   Deceased-donor kidney transplant     Brief Summary: Jenna [name] a 65 y.o. 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  She has orthopnea and was unable to walk as far as she typically does.  She has been taking torsemide 40 mg twice a day since 6/27 (which is double her usual dose) without any improvement. She was recently admitted on 5/29 through 6/7 for acute on chronic congestive heart failure and AKI.   I was notified by the RN today that her IV is no longer working and lab has tried 3 times but is unable to obtain blood. RN is asking for a PICC line. As she is a potential dialysis patient, we are holding off on a PICC. Recommending a central line but patient asking to be transferred to [facility] instead.  Hospital Course:  Principal Problem:   Acute on chronic systolic CHF (congestive heart failure)  ([name]) Ischemic cardiomyopathy -EF 20 to 25% - Improving with Lasix 80 mg twice daily but continues to wake up with sudden onset dyspnea - cardiology and nephrology following  - not a Lupus nephritis flare per nephrology - 7/10>  has no IV access and she is hesitant to get a central line- transitioned to Demadex 40 mg- voided 2x after this - mixed with stool and not measured-  working on transfer to [facility] per patient's request -phlebotomist and nurses unable to obtain any blood draws to check labs - 7/11- still unable to obtain lab work-cont management per renal -recommended to continue on BID Demadex-  - 7/12-fluid status improved, blood work able to be obtained and creatinine is stable-nephrology recommends discharge on twice daily Demadex, daily weights, low-sodium and low fluid diet-nephrology arranging follow-up in the clinic next week      Active Problems:   CMV viremia Lupus nephritis - On valganciclovir for 50 mg every other day  Status post renal transplant in 2020 on chronic immunosuppressive therapy - On tacrolimus, prednisone, hydroxychloroquine -  Myfortic being held due to CMV viremia - Appreciate nephrology following - Tacrolimus level ordered but lab unable to get blood- can be done as outpt     Paroxysmal atrial fibrillation -Cardiology recommending to resume a apixaban 5 mg twice daily   - Toprol XL increased from 25 to 50 mg   Mild leukopenia and anemia -WBC 3.2 on 6/15-about the same today which is new compared to prior labs -Hb has been ranging from 8-9   Back pain - Left  upper back and mid back- Has Lidocaine patch on left upper back, will place another on her mid back.   Discharge Exam: Vitals:   08/26/20 0817 08/26/20 1200  BP: 123/78 121/78  Pulse: 73 69  Resp: 16 16  Temp: 98.3 F (36.8 C) 98.4 F (36.9 C)  SpO2: 100% 100%   Vitals:   08/26/20 0500 08/26/20 0626 08/26/20 0817 08/26/20 1200  BP:  115/71 123/78 121/78  Pulse:  72 73 69   Resp:  $Remo'18 16 16  'TcLbA$ Temp:  97.8 F (36.6 C) 98.3 F (36.8 C) 98.4 F (36.9 C)  TempSrc:  Oral Oral Oral  SpO2:  100% 100% 100%  Weight: 90.9 kg       General: Pt is alert, awake, not in acute distress Cardiovascular: RRR, S1/S2 +, no rubs, no gallops Respiratory: CTA bilaterally, no wheezing, no rhonchi Abdominal: Soft, NT, ND, bowel sounds + Extremities: no edema, no cyanosis   Discharge Instructions  Discharge Instructions     Diet general   Complete by: As directed    Renal, heart healthy diet   Increase activity slowly   Complete by: As directed       Allergies as of 08/26/2020       Reactions   Enalapril Maleate Anaphylaxis, Swelling, Other (See Comments)   Throat swells   Penicillins Other (See Comments)   Made patient lightheaded PATIENT HAS HAD A PCN REACTION WITH IMMEDIATE RASH, FACIAL/TONGUE/THROAT SWELLING, SOB, OR LIGHTHEADEDNESS WITH HYPOTENSION:  #  #  YES  #  #  Has patient had a PCN reaction causing severe rash involving mucus membranes or skin necrosis: No Has patient had a PCN reaction that required hospitalization: No Has patient had a PCN reaction occurring within the last 10 years: No If all of the above answers are "NO", then may proceed with Cephalosporin use.   Chocolate Nausea And Vomiting   Tape Itching, Rash, Other (See Comments)        Medication List     STOP taking these medications    potassium chloride SA 20 MEQ tablet Commonly known as: KLOR-CON       TAKE these medications    acetaminophen 500 MG tablet Commonly known as: TYLENOL Take 1,000 mg by mouth every 6 (six) hours as needed for headache.   aspirin 81 MG EC tablet Take 81 mg by mouth every morning.   gabapentin 100 MG capsule Commonly known as: NEURONTIN Take 1 capsule (100 mg total) by mouth at bedtime. What changed:  when to take this reasons to take this   hydroxychloroquine 200 MG tablet Commonly known as: PLAQUENIL Take 1 tablet (200 mg total) by  mouth 2 (two) times daily. What changed: when to take this   isosorbide-hydrALAZINE 20-37.5 MG tablet Commonly known as: BIDIL Take 1 tablet by mouth 3 (three) times daily.   lidocaine 5 % Commonly known as: LIDODERM Place 1 patch onto the skin daily. Remove & Discard patch within 12 hours or as directed by MD   metoprolol succinate 50 MG 24 hr tablet Commonly known as: TOPROL-XL Take 1 tablet (50 mg total) by mouth daily. Take with or immediately following a meal. Start taking on: August 27, 2020 What changed:  medication strength how much to take additional instructions   nitroGLYCERIN 0.4 MG SL tablet Commonly known as: NITROSTAT Place 0.4 mg under the tongue every 5 (five) minutes as needed for chest pain.   pantoprazole 40 MG tablet Commonly known as: PROTONIX Take  40 mg by mouth daily.   predniSONE 5 MG tablet Commonly known as: DELTASONE Take 5 mg by mouth daily with lunch.   rosuvastatin 5 MG tablet Commonly known as: CRESTOR Take 5 mg by mouth daily at 6 PM.   simethicone 80 MG chewable tablet Commonly known as: MYLICON Chew 80 mg by mouth 4 (four) times daily as needed for flatulence.   tacrolimus 0.5 MG capsule Commonly known as: PROGRAF Take 0.5-1 mg by mouth See admin instructions. Take 1 capsule (0.5 mg) by mouth every morning and 2 capsules (1 mg) at night   Torsemide 40 MG Tabs Take 40 mg by mouth 2 (two) times daily. What changed:  medication strength when to take this   valGANciclovir 450 MG tablet Commonly known as: VALCYTE Take 450 mg by mouth every other day.        Allergies  Allergen Reactions   Enalapril Maleate Anaphylaxis, Swelling and Other (See Comments)    Throat swells   Penicillins Other (See Comments)    Made patient lightheaded PATIENT HAS HAD A PCN REACTION WITH IMMEDIATE RASH, FACIAL/TONGUE/THROAT SWELLING, SOB, OR LIGHTHEADEDNESS WITH HYPOTENSION:  #  #  YES  #  #  Has patient had a PCN reaction causing severe rash  involving mucus membranes or skin necrosis: No Has patient had a PCN reaction that required hospitalization: No Has patient had a PCN reaction occurring within the last 10 years: No If all of the above answers are "NO", then may proceed with Cephalosporin use.    Chocolate Nausea And Vomiting   Tape Itching, Rash and Other (See Comments)      DG Chest 2 View  Result Date: 08/21/2020 CLINICAL DATA:  Shortness of breath EXAM: CHEST - 2 VIEW COMPARISON:  Radiograph 07/12/2020 FINDINGS: Unchanged, enlarged cardiac silhouette. There is pulmonary vascular congestion with small bilateral pleural effusions and bibasilar atelectasis. No visible pneumothorax. No acute osseous abnormality. IMPRESSION: Cardiomegaly with pulmonary vascular congestion, small bilateral pleural effusions and adjacent bibasilar atelectasis. Electronically Signed   By: Maurine Simmering   On: 08/21/2020 11:41     The results of significant diagnostics from this hospitalization (including imaging, microbiology, ancillary and laboratory) are listed below for reference.     Microbiology: Recent Results (from the past 240 hour(s))  Resp Panel by RT-PCR (Flu A&B, Covid) Nasopharyngeal Swab     Status: None   Collection Time: 08/21/20  5:08 PM   Specimen: Nasopharyngeal Swab; Nasopharyngeal(NP) swabs in vial transport medium  Result Value Ref Range Status   SARS Coronavirus 2 by RT PCR NEGATIVE NEGATIVE Final    Comment: (NOTE) SARS-CoV-2 target nucleic acids are NOT DETECTED.  The SARS-CoV-2 RNA is generally detectable in upper respiratory specimens during the acute phase of infection. The lowest concentration of SARS-CoV-2 viral copies this assay can detect is 138 copies/mL. A negative result does not preclude SARS-Cov-2 infection and should not be used as the sole basis for treatment or other patient management decisions. A negative result may occur with  improper specimen collection/handling, submission of specimen  other than nasopharyngeal swab, presence of viral mutation(s) within the areas targeted by this assay, and inadequate number of viral copies(<138 copies/mL). A negative result must be combined with clinical observations, patient history, and epidemiological information. The expected result is Negative.  Fact Sheet for Patients:  EntrepreneurPulse.com.au  Fact Sheet for Healthcare Providers:  IncredibleEmployment.be  This test is no t yet approved or cleared by the [place] FDA and  has been  authorized for detection and/or diagnosis of SARS-CoV-2 by FDA under an Emergency Use Authorization (EUA). This EUA will remain  in effect (meaning this test can be used) for the duration of the COVID-19 declaration under Section 564(b)(1) of the Act, 21 U.S.C.section 360bbb-3(b)(1), unless the authorization is terminated  or revoked sooner.       Influenza A by PCR NEGATIVE NEGATIVE Final   Influenza B by PCR NEGATIVE NEGATIVE Final    Comment: (NOTE) The Xpert Xpress SARS-CoV-2/FLU/RSV plus assay is intended as an aid in the diagnosis of influenza from Nasopharyngeal swab specimens and should not be used as a sole basis for treatment. Nasal washings and aspirates are unacceptable for Xpert Xpress SARS-CoV-2/FLU/RSV testing.  Fact Sheet for Patients: EntrepreneurPulse.com.au  Fact Sheet for Healthcare Providers: IncredibleEmployment.be  This test is not yet approved or cleared by the [place] FDA and has been authorized for detection and/or diagnosis of SARS-CoV-2 by FDA under an Emergency Use Authorization (EUA). This EUA will remain in effect (meaning this test can be used) for the duration of the COVID-19 declaration under Section 564(b)(1) of the Act, 21 U.S.C. section 360bbb-3(b)(1), unless the authorization is terminated or revoked.  Performed at [facility], [place] [address], [place],  [place] [address]      Labs: BNP (last 3 results) Recent Labs    07/11/20 0406 08/21/20 1053  BNP >4,500.0* >3,545.6*   Basic Metabolic Panel: Recent Labs  Lab 08/21/20 1053 08/21/20 1830 08/22/20 0542 08/23/20 0149 08/26/20 0621 08/26/20 0626  NA 134*  --  137 136  --  134*  K 3.8  --  4.1 3.8  --  4.4  CL 102  --  104 107  --  102  CO2 23  --  20* 21*  --  23  GLUCOSE 103*  --  83 113*  --  84  BUN 64*  --  58* 56*  --  49*  CREATININE 3.43*  --  3.30* 3.18*  --  3.05*  CALCIUM 9.1  --  9.4 9.0  --  8.7*  MG  --  2.1 2.0 2.1 2.1  --   PHOS  --   --   --   --   --  4.0   Liver Function Tests: Recent Labs  Lab 08/26/20 0626  ALBUMIN 2.8*   No results for input(s): LIPASE, AMYLASE in the last 168 hours. No results for input(s): AMMONIA in the last 168 hours. CBC: Recent Labs  Lab 08/21/20 1053 08/22/20 0542  WBC 2.7* 3.4*  NEUTROABS 1.7  --   HGB 8.6* 9.3*  HCT 28.5* 29.8*  MCV 98.3 94.9  PLT 204 205   Cardiac Enzymes: No results for input(s): CKTOTAL, CKMB, CKMBINDEX, TROPONINI in the last 168 hours. BNP: Invalid input(s): POCBNP CBG: No results for input(s): GLUCAP in the last 168 hours. D-Dimer No results for input(s): DDIMER in the last 72 hours. Hgb A1c No results for input(s): HGBA1C in the last 72 hours. Lipid Profile No results for input(s): CHOL, HDL, LDLCALC, TRIG, CHOLHDL, LDLDIRECT in the last 72 hours. Thyroid function studies No results for input(s): TSH, T4TOTAL, T3FREE, THYROIDAB in the last 72 hours.  Invalid input(s): FREET3 Anemia work up No results for input(s): VITAMINB12, FOLATE, FERRITIN, TIBC, IRON, RETICCTPCT in the last 72 hours. Urinalysis    Component Value Date/Time   COLORURINE YELLOW 07/12/2020 1037   APPEARANCEUR CLOUDY (A) 07/12/2020 1037   LABSPEC 1.013 07/12/2020 1037   PHURINE 5.0  07/12/2020 1037   GLUCOSEU NEGATIVE 07/12/2020 1037   HGBUR SMALL (A) 07/12/2020 1037   BILIRUBINUR NEGATIVE 07/12/2020 1037    BILIRUBINUR neg 12/19/2014 1157   [place] 07/12/2020 1037   PROTEINUR >=300 (A) 07/12/2020 1037   UROBILINOGEN 0.2 12/19/2014 1157   UROBILINOGEN 0.2 05/23/2014 1301   NITRITE NEGATIVE 07/12/2020 1037   LEUKOCYTESUR LARGE (A) 07/12/2020 1037   Sepsis Labs Invalid input(s): PROCALCITONIN,  WBC,  LACTICIDVEN Microbiology Recent Results (from the past 240 hour(s))  Resp Panel by RT-PCR (Flu A&B, Covid) Nasopharyngeal Swab     Status: None   Collection Time: 08/21/20  5:08 PM   Specimen: Nasopharyngeal Swab; Nasopharyngeal(NP) swabs in vial transport medium  Result Value Ref Range Status   SARS Coronavirus 2 by RT PCR NEGATIVE NEGATIVE Final    Comment: (NOTE) SARS-CoV-2 target nucleic acids are NOT DETECTED.  The SARS-CoV-2 RNA is generally detectable in upper respiratory specimens during the acute phase of infection. The lowest concentration of SARS-CoV-2 viral copies this assay can detect is 138 copies/mL. A negative result does not preclude SARS-Cov-2 infection and should not be used as the sole basis for treatment or other patient management decisions. A negative result may occur with  improper specimen collection/handling, submission of specimen other than nasopharyngeal swab, presence of viral mutation(s) within the areas targeted by this assay, and inadequate number of viral copies(<138 copies/mL). A negative result must be combined with clinical observations, patient history, and epidemiological information. The expected result is Negative.  Fact Sheet for Patients:  EntrepreneurPulse.com.au  Fact Sheet for Healthcare Providers:  IncredibleEmployment.be  This test is no t yet approved or cleared by the [place] FDA and  has been authorized for detection and/or diagnosis of SARS-CoV-2 by FDA under an Emergency Use Authorization (EUA). This EUA will remain  in effect (meaning this test can be used) for the duration of  the COVID-19 declaration under Section 564(b)(1) of the Act, 21 U.S.C.section 360bbb-3(b)(1), unless the authorization is terminated  or revoked sooner.       Influenza A by PCR NEGATIVE NEGATIVE Final   Influenza B by PCR NEGATIVE NEGATIVE Final    Comment: (NOTE) The Xpert Xpress SARS-CoV-2/FLU/RSV plus assay is intended as an aid in the diagnosis of influenza from Nasopharyngeal swab specimens and should not be used as a sole basis for treatment. Nasal washings and aspirates are unacceptable for Xpert Xpress SARS-CoV-2/FLU/RSV testing.  Fact Sheet for Patients: EntrepreneurPulse.com.au  Fact Sheet for Healthcare Providers: IncredibleEmployment.be  This test is not yet approved or cleared by the [place] FDA and has been authorized for detection and/or diagnosis of SARS-CoV-2 by FDA under an Emergency Use Authorization (EUA). This EUA will remain in effect (meaning this test can be used) for the duration of the COVID-19 declaration under Section 564(b)(1) of the Act, 21 U.S.C. section 360bbb-3(b)(1), unless the authorization is terminated or revoked.  Performed at [facility], [place] [address] [place], [place] [address]      Time coordinating discharge in minutes: 65  SIGNED:   Debbe Odea, MD  [facility] 08/26/2020, 4:47 PM

## 2020-08-26 NOTE — Plan of Care (Signed)

## 2020-08-26 NOTE — Progress Notes (Signed)
[name] Progress Note    Assessment/ Plan:   Problem List: Acute on chronic systolic CHF exacerbation. EF 20-25%.  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 H/O Lupus nephritis & FSGS. With h/o recent AKI. Possible CRS. BL Cr ~ mid-3 On chronic immunosuppression H/O Recent CMV viremia, on valcyte, off myfortic H/O recent Pseudomonas UTI H/o CAD s/p STEMI A fib HTN Pre-DM LUE AVF   Plan/Recommendations: -again no labs today, will need access for labs (still able to get meds). She states she would be okay with a central line. Avoid picc.  -Volume status clinically appears to be better and she's feeling better though this isn't reflected fully in her I/Os and weights.  I think she's ok to d/c as long as we make sure labs stable - i've ordered a RFP added on to magnesium checked this AM. Would use torsemide 40 BID (was taking 40 daily prior to admission) for now with instructions for daily weights, low na/fluid diet.  I will arrange for her to f/u in clinic next week. -Cr seems to be around her new baseline which is around mid-3's but need updated labs. -c/w home immunosuppression: tacrolimus 0.5mg  qam + 1mg  qpm, prednisone 5mg  daily. Continue to hold myfortic in light of recent CMV viremia -CMV PCR needs to be drawn when labs obtained -tacrolimus trough drawn 7/9-level 4.1 which is appropriate -presentation not consistent with SLE flare  -Immunosuppression Management: High Risk Medical Decision Making For Drug Therapy Requiring Intensive Monitoring For Toxicity I/S levels: 4.1, drawn 7/9 Will continue intensive monitoring for drug levels and toxicity via regularly scheduled laboratory assays given the narrow therapeutic index of the immunosuppressive drug therapy listed above.  Goal FK level 3-5  -Avoid nephrotoxic medications including NSAIDs and iodinated intravenous contrast exposure unless the latter is absolutely indicated.  Preferred  narcotic agents for pain control are hydromorphone, fentanyl, and methadone. Morphine should not be used. Avoid Baclofen and avoid oral sodium phosphate and magnesium citrate based laxatives / bowel preps. Continue strict Input and Output monitoring. Will monitor the patient closely with you and intervene or adjust therapy as indicated by changes in clinical status/labs  Jannifer Hick MD [place] Pager [phone]   Subjective:   Says feeling some better.  Still no labs - IR consulted for central line for labs but a magnesium resulted this AM.  Declined transfer to [facility] yesterday when they had a bed for her.     Objective:   BP 123/78 (BP Location: Right Arm)   Pulse 73   Temp 98.3 F (36.8 C) (Oral)   Resp 16   Wt 90.9 kg   SpO2 100%   BMI 31.39 kg/m   Intake/Output Summary (Last 24 hours) at 08/26/2020 1106 Last data filed at 08/26/2020 4287 Gross per 24 hour  Intake 483 ml  Output 625 ml  Net -142 ml    Weight change: 0.816 kg  Physical Exam: Gen: nad, sitting up in bed  CVS:s1s2, rrr Resp: L basilar rales persist, R clear; normal WOB GOT:LXBW, nt/nd Ext: improved edema bilateral ankles. 1+ pitting feet; LUE AVF +t/b Neuro: awake, alert, no tremors, speech clear and coherent  Imaging: No results found.  Labs: BMET Recent Labs  Lab 08/21/20 1053 08/22/20 0542 08/23/20 0149  NA 134* 137 136  K 3.8 4.1 3.8  CL 102 104 107  CO2 23 20* 21*  GLUCOSE 103* 83 113*  BUN 64* 58* 56*  CREATININE 3.43* 3.30* 3.18*  CALCIUM 9.1 9.4 9.0    CBC Recent Labs  Lab 08/21/20 1053 08/22/20 0542  WBC 2.7* 3.4*  NEUTROABS 1.7  --   HGB 8.6* 9.3*  HCT 28.5* 29.8*  MCV 98.3 94.9  PLT 204 205     Medications:     aspirin EC  81 mg Oral q morning   heparin  5,000 Units Subcutaneous Q8H   hydroxychloroquine  200 mg Oral Daily   isosorbide-hydrALAZINE  1 tablet Oral TID   lidocaine  2 patch Transdermal Q24H   metoprolol succinate  50 mg Oral Daily    pantoprazole  40 mg Oral Daily   polyethylene glycol  17 g Oral Daily   predniSONE  5 mg Oral Q lunch   rosuvastatin  5 mg Oral q1800   sodium chloride flush  3 mL Intravenous Q12H   tacrolimus  0.5 mg Oral Daily   tacrolimus  1 mg Oral QHS   torsemide  40 mg Oral BID   valGANciclovir  450 mg Oral QODAY    Jannifer Hick MD [facility] Pager [phone]

## 2020-08-26 NOTE — Plan of Care (Signed)
Problem: Education: Goal: Knowledge of General Education information will improve Description: Including pain rating scale, medication(s)/side effects and non-pharmacologic comfort measures 08/26/2020 1804 by Salina April, RN Outcome: Adequate for Discharge 08/26/2020 1113 by Salina April, RN Outcome: Progressing   Problem: Health Behavior/Discharge Planning: Goal: Ability to manage health-related needs will improve 08/26/2020 1804 by Salina April, RN Outcome: Adequate for Discharge 08/26/2020 1113 by Salina April, RN Outcome: Progressing   Problem: Clinical Measurements: Goal: Ability to maintain clinical measurements within normal limits will improve 08/26/2020 1804 by Salina April, RN Outcome: Adequate for Discharge 08/26/2020 1113 by Salina April, RN Outcome: Progressing Goal: Will remain free from infection 08/26/2020 1804 by Salina April, RN Outcome: Adequate for Discharge 08/26/2020 1113 by Salina April, RN Outcome: Progressing Goal: Diagnostic test results will improve 08/26/2020 1804 by Salina April, RN Outcome: Adequate for Discharge 08/26/2020 1113 by Salina April, RN Outcome: Progressing Goal: Respiratory complications will improve 08/26/2020 1804 by Salina April, RN Outcome: Adequate for Discharge 08/26/2020 1113 by Salina April, RN Outcome: Progressing Goal: Cardiovascular complication will be avoided 08/26/2020 1804 by Salina April, RN Outcome: Adequate for Discharge 08/26/2020 1113 by Salina April, RN Outcome: Progressing   Problem: Activity: Goal: Risk for activity intolerance will decrease 08/26/2020 1804 by Salina April, RN Outcome: Adequate for Discharge 08/26/2020 1113 by Salina April, RN Outcome: Progressing   Problem: Nutrition: Goal: Adequate nutrition will be maintained 08/26/2020 1804 by Salina April, RN Outcome: Adequate for Discharge 08/26/2020 1113 by Salina April, RN Outcome: Progressing   Problem: Coping: Goal: Level of anxiety  will decrease 08/26/2020 1804 by Salina April, RN Outcome: Adequate for Discharge 08/26/2020 1113 by Salina April, RN Outcome: Progressing   Problem: Elimination: Goal: Will not experience complications related to bowel motility 08/26/2020 1804 by Salina April, RN Outcome: Adequate for Discharge 08/26/2020 1113 by Salina April, RN Outcome: Progressing Goal: Will not experience complications related to urinary retention 08/26/2020 1804 by Salina April, RN Outcome: Adequate for Discharge 08/26/2020 1113 by Salina April, RN Outcome: Progressing   Problem: Pain Managment: Goal: General experience of comfort will improve 08/26/2020 1804 by Salina April, RN Outcome: Adequate for Discharge 08/26/2020 1113 by Salina April, RN Outcome: Progressing   Problem: Safety: Goal: Ability to remain free from injury will improve 08/26/2020 1804 by Salina April, RN Outcome: Adequate for Discharge 08/26/2020 1113 by Salina April, RN Outcome: Progressing   Problem: Skin Integrity: Goal: Risk for impaired skin integrity will decrease 08/26/2020 1804 by Salina April, RN Outcome: Adequate for Discharge 08/26/2020 1113 by Salina April, RN Outcome: Progressing   Problem: Education: Goal: Ability to demonstrate management of disease process will improve 08/26/2020 1804 by Salina April, RN Outcome: Adequate for Discharge 08/26/2020 1113 by Salina April, RN Outcome: Progressing Goal: Ability to verbalize understanding of medication therapies will improve 08/26/2020 1804 by Salina April, RN Outcome: Adequate for Discharge 08/26/2020 1113 by Salina April, RN Outcome: Progressing Goal: Individualized Educational Video(s) 08/26/2020 1804 by Salina April, RN Outcome: Adequate for Discharge 08/26/2020 1113 by Salina April, RN Outcome: Progressing   Problem: Activity: Goal: Capacity to carry out activities will improve 08/26/2020 1804 by Salina April, RN Outcome: Adequate for  Discharge 08/26/2020 1113 by Salina April, RN Outcome: Progressing   Problem: Cardiac: Goal: Ability to achieve and maintain adequate cardiopulmonary perfusion  will improve 08/26/2020 1804 by Salina April, RN Outcome: Adequate for Discharge 08/26/2020 1113 by Salina April, RN Outcome: Progressing

## 2020-08-27 ENCOUNTER — Telehealth: Payer: Self-pay

## 2020-08-27 ENCOUNTER — Telehealth: Payer: Self-pay | Admitting: *Deleted

## 2020-08-27 NOTE — Telephone Encounter (Signed)
-----   Message from Geralynn Rile, MD sent at 08/25/2020  5:33 PM EDT ----- Regarding: Follow-up 4-6 weeks Jenna [name]:  She has expressed interest in switching to [name] from [facility]. Please call her and get a 4-6 week appointment.   [name] Audie Box, MD, [place]  [address] [place], [place] [address] [phone]  5:34 PM

## 2020-08-27 NOTE — Chronic Care Management (AMB) (Signed)
  Chronic Care Management   Note  08/27/2020 Name: Jenna [name] MRN: [id number] DOB: 1955/02/17  Jenna [name] is a 65 y.o. year old female who is a primary care patient of [name], [name], MD. I reached out to [facility] by phone today in response to a referral sent by [name]'s PCP, Dr. [name].      [name] was given information about Chronic Care Management services today including:  CCM service includes personalized support from designated clinical staff supervised by her physician, including individualized plan of care and coordination with other care providers 24/7 contact phone numbers for assistance for urgent and routine care needs. Service will only be billed when office clinical staff spend 20 minutes or more in a month to coordinate care. Only one practitioner may furnish and bill the service in a calendar month. The patient may stop CCM services at any time (effective at the end of the month) by phone call to the office staff. The patient will be responsible for cost sharing (co-pay) of up to 20% of the service fee (after annual deductible is met).  Patient agreed to services and verbal consent obtained.   Follow up plan: Telephone appointment with care management team member scheduled for:09/05/2020  [place] Management

## 2020-08-27 NOTE — Telephone Encounter (Signed)
Called patient to offer an appointment with Dr.O'Neal as new patient.  LVM to call back to get scheduled.  Left call back number.

## 2020-08-29 ENCOUNTER — Encounter: Payer: Self-pay | Admitting: Family Medicine

## 2020-08-29 ENCOUNTER — Telehealth (INDEPENDENT_AMBULATORY_CARE_PROVIDER_SITE_OTHER): Payer: Medicare Other | Admitting: Family Medicine

## 2020-08-29 VITALS — Ht 67.0 in

## 2020-08-29 DIAGNOSIS — I129 Hypertensive chronic kidney disease with stage 1 through stage 4 chronic kidney disease, or unspecified chronic kidney disease: Secondary | ICD-10-CM | POA: Diagnosis not present

## 2020-08-29 DIAGNOSIS — N189 Chronic kidney disease, unspecified: Secondary | ICD-10-CM

## 2020-08-29 DIAGNOSIS — I2510 Atherosclerotic heart disease of native coronary artery without angina pectoris: Secondary | ICD-10-CM

## 2020-08-29 DIAGNOSIS — I5020 Unspecified systolic (congestive) heart failure: Secondary | ICD-10-CM | POA: Diagnosis not present

## 2020-08-29 DIAGNOSIS — I480 Paroxysmal atrial fibrillation: Secondary | ICD-10-CM

## 2020-08-29 DIAGNOSIS — N179 Acute kidney failure, unspecified: Secondary | ICD-10-CM

## 2020-08-29 NOTE — Progress Notes (Signed)
Virtual Visit via Video Note I connected with Jenna [name] on 08/29/20 by a video enabled telemedicine application and verified that I am speaking with the correct person using two identifiers.  Location patient: home Location provider:work office Persons participating in the virtual visit: patient, [place] [facility], provider  I discussed the limitations of evaluation and management by telemedicine and the availability of in person appointments. The patient expressed understanding and agreed to proceed.  Chief Complaint  Patient presents with   discuss social worker   Hospitalization Follow-up   HPI: Jenna [name] is a 65 yo female with history of hypertension, systemic lupus erythematosus, CAD, paroxysmal atrial fibrillation, CHF, and ESRD s/p renal transplant following on recent hospitalization. She was admitted on 08/21/2020 because of worsening dyspnea, LE edema, orthopnea,and not able to walk. Dx'ed acute CHF exacerbation and AKI. She was discharged home on 08/27/2018 .  Torsemide dose was double, to 40 mg twice daily since 08/11/20 but did not seem to be effective. SOB exacerbated by exertion. No CP or palpitations. Negative for fever,abdominal pain,N/V,or urinary symptoms. + Fatigue and changes in appetite.  She is on Metoprolol succinate 50 mg daily and Bidil 20-37.5 tid. Paroxysmal atrial fib: Eliquis 5 mg bid was resumed.  TTE on 08/19/2020: LVEF 20 to 25%.  Apical akinesis with aneurysmal changes.  Anteroseptum and apical septum akinetic.  Moderate hypokinesis of other segments.  She has an appointment with her nephrologist on 09/11/2020. She has appointment with cardiologist, she will be establishing with new provider.  She is receiving [facility] services, PT is now with her, she recommended having blood work done at home through [facility], nursing service, and social worker services at home.  She was difficulty leaving the house, she lives in a second floor apartment , has to walk more than 10 steps, so it  is hard for her to   In general she is feeling better, frustrated because her insurance denied aid/assistance with chores/ADL's.. Forms have been completed but she would like for Jenna [name] to complete another set of documents.  Lab Results  Component Value Date   CREATININE 3.05 (H) 08/26/2020   BUN 49 (H) 08/26/2020   NA 134 (L) 08/26/2020   K 4.4 08/26/2020   CL 102 08/26/2020   CO2 23 08/26/2020    ROS: See pertinent positives and negatives per HPI.  Past Medical History:  Diagnosis Date   Anemia    Anxiety    panic attack- talks herself and takes deep breathes   CAD (coronary artery disease)    a. STEMI 10/2018 s/p DES to LAD.   Depression    Dyspnea    with exertion    ESRD (end stage renal disease) ([place])    hemo TTHSAT   Essential hypertension    FSGS (focal segmental glomerulosclerosis) 2011   By renal biopsy   Head injury    age 45   HFrEF (heart failure with reduced ejection fraction) ([place])    History of kidney stones    kidney stone   Hx of lupus nephritis 2011   by renal biopsy   Ischemic cardiomyopathy    Kidney transplant recipient    LA thrombus 10/2018   Lupus (systemic lupus erythematosus) ([place])    followed by Dr. 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 remote parietal infarct on brain MRI 12/2019.   Pericarditis    age 8ish  Renal stone    Secondary hyperparathyroidism of renal origin ([name])    SLE (systemic lupus erythematosus) ([name])    TIA (transient ischemic attack)    no residual effects    Past Surgical History:  Procedure Laterality Date   AV FISTULA PLACEMENT Left 07/04/2017   Procedure: ARTERIOVENOUS (AV) FISTULA CREATION BRACHIOCEPHALIC;  Surgeon: [name], MD;  Location: [facility];  Service: Vascular;  Laterality: Left;   COLONOSCOPY W/ POLYPECTOMY     IR FLUORO GUIDE CV LINE RIGHT  06/28/2017   IR Jenna [name] GUIDE VASC ACCESS RIGHT  06/28/2017   KIDNEY  SURGERY     kidney transplant 2020   REVISION OF ARTERIOVENOUS GORETEX GRAFT Left 11/30/2017   Procedure: REVISION OF ARTERIOVENOUS FISTULA LEFT ARM Superfistulization and branch ligation.;  Surgeon: Waynetta Sandy, MD;  Location: [facility];  Service: Vascular;  Laterality: Left;    Family History  Problem Relation Age of Onset   Diabetes Mother    Hypertension Father    Cancer Sister    Hypertension Brother     Social History   Socioeconomic History   Marital status: Legally Separated    Spouse name: Not on file   Number of children: 1   Years of education: Not on file   Highest education level: Not on file  Occupational History   Not on file  Tobacco Use   Smoking status: Never   Smokeless tobacco: Never  Vaping Use   Vaping Use: Never used  Substance and Sexual Activity   Alcohol use: No   Drug use: No   Sexual activity: Not Currently  Other Topics Concern   Not on file  Social History Narrative   Right Handed   Lives in a two story home   Daughter recently got married    Social Determinants of Health   Financial Resource Strain: Low Risk    Difficulty of Paying Living Expenses: Not very hard  Food Insecurity: No Food Insecurity   Worried About Charity fundraiser in the Last Year: Never true   [place] in the Last Year: Never true  Transportation Needs: No Transportation Needs   Lack of Transportation (Medical): No   Lack of Transportation (Non-Medical): No  Physical Activity: Sufficiently Active   Days of Exercise per Week: 5 days   Minutes of Exercise per Session: 30 min  Stress: Stress Concern Present   Feeling of Stress : To some extent  Social Connections: Moderately Isolated   Frequency of Communication with Friends and Family: More than three times a week   Frequency of Social Gatherings with Friends and Family: Once a week   Attends Religious Services: 1 to 4 times per year   Active Member of [employer] or Organizations: No   Attends  Archivist Meetings: Never   Marital Status: Separated  Intimate Partner Violence: Not At Risk   Fear of Current or Ex-Partner: No   Emotionally Abused: No   Physically Abused: No   Sexually Abused: No      Current Outpatient Medications:    acetaminophen (TYLENOL) 500 MG tablet, Take 1,000 mg by mouth every 6 (six) hours as needed for headache., Disp: , Rfl:    aspirin 81 MG EC tablet, Take 81 mg by mouth every morning., Disp: , Rfl:    gabapentin (NEURONTIN) 100 MG capsule, Take 1 capsule (100 mg total) by mouth at bedtime., Disp: 30 capsule, Rfl: 2   hydroxychloroquine (PLAQUENIL) 200 MG tablet, Take  1 tablet (200 mg total) by mouth 2 (two) times daily., Disp: 180 tablet, Rfl: 3   isosorbide-hydrALAZINE (BIDIL) 20-37.5 MG tablet, Take 1 tablet by mouth 3 (three) times daily., Disp: 90 tablet, Rfl: 0   lidocaine (LIDODERM) 5 %, Place 1 patch onto the skin daily. Remove & Discard patch within 12 hours or as directed by MD, Disp: , Rfl:    metoprolol succinate (TOPROL-XL) 50 MG 24 hr tablet, Take 1 tablet (50 mg total) by mouth daily. Take with or immediately following a meal., Disp: 30 tablet, Rfl: 0   nitroGLYCERIN (NITROSTAT) 0.4 MG SL tablet, Place 0.4 mg under the tongue every 5 (five) minutes as needed for chest pain. , Disp: , Rfl:    pantoprazole (PROTONIX) 40 MG tablet, Take 40 mg by mouth daily., Disp: , Rfl:    predniSONE (DELTASONE) 5 MG tablet, Take 5 mg by mouth daily with lunch., Disp: , Rfl:    rosuvastatin (CRESTOR) 5 MG tablet, Take 5 mg by mouth daily at 6 PM., Disp: , Rfl:    simethicone (MYLICON) 80 MG chewable tablet, Chew 80 mg by mouth 4 (four) times daily as needed for flatulence., Disp: , Rfl:    tacrolimus (PROGRAF) 0.5 MG capsule, Take 0.5-1 mg by mouth See admin instructions. Take 1 capsule (0.5 mg) by mouth every morning and 2 capsules (1 mg) at night, Disp: , Rfl:    torsemide 40 MG TABS, Take 40 mg by mouth 2 (two) times daily., Disp: 80 tablet, Rfl:  0   valGANciclovir (VALCYTE) 450 MG tablet, Take 450 mg by mouth every other day., Disp: , Rfl:   EXAM:  VITALS per patient if applicable:Ht $RemoveBeforeDE'5\' 7"'ehOAbghMIgHPHpE$  (1.702 m)   BMI 31.39 kg/m   GENERAL: alert, oriented, appears well and in no acute distress  HEENT: atraumatic, conjunctiva clear, no obvious abnormalities on inspection.  NECK: normal movements of the head and neck  LUNGS: on inspection no signs of respiratory distress, breathing rate appears normal, no obvious gross SOB, gasping or wheezing  CV: no obvious cyanosis  MS: moves all visible extremities without noticeable abnormality  PSYCH/NEURO: pleasant and cooperative, no obvious depression.+ Anxious, thought processing grossly intact  ASSESSMENT AND PLAN:  Discussed the following assessment and plan:  HFrEF (heart failure with reduced ejection fraction) ([name]) Continue Torsemide 40 mg bid,low salt diet,Metoprolol succinate 50 mg,and Bidil . Keep appt with cardiologist.  Hypertension with renal disease BP adequate controlled during hospitalization. Continue Metoprolol succinate and Bidil same dose. Low salt diet. Monitor BP at home.  Paroxysmal atrial fibrillation ([name]) Continue Eliquis 5 mg bid and Metoprolol succinate 50 mg daily. Following with cardiologist.  Acute kidney injury superimposed on chronic kidney disease ([place]) S/P renal transplant in 2020 on chronic immunosuppressive therapy. Continue Tacrolimus,Prednisone,and plaquenil same dose. Not able to get blood during hospitalization. Keep appt with nephrologist.  [place] new orders will be faxed to the office for me to sign, nursing and social worker service. We will be glad to fill out insurance forms again, she will have forms fax to Jenna [name].   I discussed the assessment and treatment plan with the patient. Ms Linares was provided an opportunity to ask questions and all were answered. She agreed with the plan and demonstrated an understanding of the  instructions.  Return in about 3 months (around 11/29/2020).  Rokhaya Quinn [name], MD

## 2020-08-30 ENCOUNTER — Encounter: Payer: Self-pay | Admitting: Family Medicine

## 2020-09-01 ENCOUNTER — Other Ambulatory Visit: Payer: Self-pay

## 2020-09-01 ENCOUNTER — Encounter: Payer: Self-pay | Admitting: Family Medicine

## 2020-09-01 ENCOUNTER — Ambulatory Visit (INDEPENDENT_AMBULATORY_CARE_PROVIDER_SITE_OTHER): Payer: Medicare Other | Admitting: Family Medicine

## 2020-09-01 VITALS — BP 130/70 | HR 54 | Resp 16 | Ht 67.0 in

## 2020-09-01 DIAGNOSIS — G4700 Insomnia, unspecified: Secondary | ICD-10-CM

## 2020-09-01 DIAGNOSIS — I129 Hypertensive chronic kidney disease with stage 1 through stage 4 chronic kidney disease, or unspecified chronic kidney disease: Secondary | ICD-10-CM | POA: Diagnosis not present

## 2020-09-01 DIAGNOSIS — I480 Paroxysmal atrial fibrillation: Secondary | ICD-10-CM

## 2020-09-01 DIAGNOSIS — R5383 Other fatigue: Secondary | ICD-10-CM

## 2020-09-01 DIAGNOSIS — I2510 Atherosclerotic heart disease of native coronary artery without angina pectoris: Secondary | ICD-10-CM

## 2020-09-01 DIAGNOSIS — R0602 Shortness of breath: Secondary | ICD-10-CM

## 2020-09-01 DIAGNOSIS — G63 Polyneuropathy in diseases classified elsewhere: Secondary | ICD-10-CM

## 2020-09-01 DIAGNOSIS — F419 Anxiety disorder, unspecified: Secondary | ICD-10-CM

## 2020-09-01 MED ORDER — TRAZODONE HCL 50 MG PO TABS: 25.0000 mg | ORAL_TABLET | Freq: Every evening | ORAL | 1 refills | Status: DC | PRN

## 2020-09-01 NOTE — Patient Instructions (Addendum)
A few things to remember from today's visit:   Hypertension with renal disease  SOB (shortness of breath)  Fatigue, unspecified type  Paroxysmal atrial fibrillation ([name])  Anxiety disorder, unspecified type  Polyneuropathy associated with underlying disease ([place])  Insomnia, unspecified type - Plan: traZODone (DESYREL) 50 MG tablet  Fatigue is most likely related to heart disease,frequent hospitalizations,and medication side effects. Ig gabapentin is not helping with neuropathy we need to stop it. Trazodone started today, start 1/2 tab and increase to whole tab. Fall precautions. Fluid restriction: Max 40 oz 91.2 Lt). Monitor blood pressure and heart rate (pulse). Keep appt with nephrologist and cardiologist.  Bruising can be aggravated by eliquis but you need to continue it.  If you need refills please call your pharmacy. Do not use My Chart to request refills or for acute issues that need immediate attention.    Please be sure medication list is accurate. If a new problem present, please set up appointment sooner than planned today.

## 2020-09-01 NOTE — Progress Notes (Signed)
Chief Complaint  Patient presents with   Insomnia   HPI: Jenna [name] is a 65 y.o. female, who is here today complaining of fatigue,SOB,and insomnia. Anxiety, she took low dose Lexapro and Buspar in the past, she is not sure why medications were discontinued. Difficulty falling asleep, she does "toss and turns." Problem has been intermittent for a while but seems to be sore for the past 2 weeks, 3 hours at the time. States that sometimes she does "not sleep at all." Taking naps during the day. She feels tired all the time. She has had a few hospitalizations this year.  She was seeing psychiatrist but having difficulty keeping appt, Jenna [name], she would like to establish with a provider in the area. She feels like Buspar was helping some with anxiety.  She takes Gabapentin 100 mg at bedtime for peripheral neuropathy, LLE pain and paresthesia. She is not sure if medication is helping with symptoms.Problem has been going on for 2 years.  She is living alone, has [facility] services arranged. She is trying to get an aid to help her with ADL's and IADL"s, states that she has a "mediator" negotiating with Medicare. She is driving. Uses a cane for assistance.  SOB stable since hospital discharge. HFrEF, EF 20-25%. Asking me how much fluids she should be drinking.  Sleeping on 2 pillows. She is on Torsemide 40 mg bid, which she does not feel like it is helping with SOB. She is not having LE edema. She is not weighing at home, waiting for a scale. Productive cough, occasionally yellowish sputum, no hemoptysis. Negative for wheezing.  Lab Results  Component Value Date   CREATININE 3.05 (H) 08/26/2020   BUN 49 (H) 08/26/2020   NA 134 (L) 08/26/2020   K 4.4 08/26/2020   CL 102 08/26/2020   CO2 23 08/26/2020   Lab Results  Component Value Date   WBC 3.4 (L) 08/22/2020   HGB 9.3 (L) 08/22/2020   HCT 29.8 (L) 08/22/2020   MCV 94.9 08/22/2020   PLT 205 08/22/2020   She has  appt for labs Wednesday. Left lower back pain, not radiated that she noted a few days ago. She took Tylenol and applied Vicks vapor rub and feeling better.  C/O dry/scaly skin, and bruising. She has not noted rash. Atrial fib: She is on Eliquis 5 mg bid and wonders if she needs to continue it. She has not noted blood in stool,melena,or gross hematuria. She is on Metoprolol succinate 50 mg daily and Bidil 20-37.5 mg tid. No checking BP's. Today mild bradycardia.  Review of Systems  Constitutional:  Positive for activity change, appetite change and fatigue. Negative for chills and fever.  HENT:  Negative for mouth sores, nosebleeds and sore throat.   Eyes:  Negative for redness and visual disturbance.  Cardiovascular:  Negative for chest pain and palpitations.  Gastrointestinal:  Negative for abdominal pain, nausea and vomiting.       Negative for changes in bowel habits.  Genitourinary:  Negative for decreased urine volume and dysuria.  Musculoskeletal:  Positive for gait problem. Negative for myalgias.  Neurological:  Negative for syncope, weakness and headaches.  Hematological:  Negative for adenopathy. Bruises/bleeds easily.  Psychiatric/Behavioral:  Positive for sleep disturbance. Negative for confusion and hallucinations.   Rest see pertinent positives and negatives per HPI.  Current Outpatient Medications on File Prior to Visit  Medication Sig Dispense Refill   acetaminophen (TYLENOL) 500 MG tablet Take 1,000 mg by mouth every  6 (six) hours as needed for headache.     aspirin 81 MG EC tablet Take 81 mg by mouth every morning.     hydroxychloroquine (PLAQUENIL) 200 MG tablet Take 1 tablet (200 mg total) by mouth 2 (two) times daily. 180 tablet 3   isosorbide-hydrALAZINE (BIDIL) 20-37.5 MG tablet Take 1 tablet by mouth 3 (three) times daily. 90 tablet 0   lidocaine (LIDODERM) 5 % Place 1 patch onto the skin daily. Remove & Discard patch within 12 hours or as directed by MD      metoprolol succinate (TOPROL-XL) 50 MG 24 hr tablet Take 1 tablet (50 mg total) by mouth daily. Take with or immediately following a meal. 30 tablet 0   nitroGLYCERIN (NITROSTAT) 0.4 MG SL tablet Place 0.4 mg under the tongue every 5 (five) minutes as needed for chest pain.      pantoprazole (PROTONIX) 40 MG tablet Take 40 mg by mouth daily.     predniSONE (DELTASONE) 5 MG tablet Take 5 mg by mouth daily with lunch.     rosuvastatin (CRESTOR) 5 MG tablet Take 5 mg by mouth daily at 6 PM.     simethicone (MYLICON) 80 MG chewable tablet Chew 80 mg by mouth 4 (four) times daily as needed for flatulence.     tacrolimus (PROGRAF) 0.5 MG capsule Take 0.5-1 mg by mouth See admin instructions. Take 1 capsule (0.5 mg) by mouth every morning and 2 capsules (1 mg) at night     torsemide 40 MG TABS Take 40 mg by mouth 2 (two) times daily. 80 tablet 0   valGANciclovir (VALCYTE) 450 MG tablet Take 450 mg by mouth every other day.     No current facility-administered medications on file prior to visit.   Past Medical History:  Diagnosis Date   Anemia    Anxiety    panic attack- talks herself and takes deep breathes   CAD (coronary artery disease)    a. STEMI 10/2018 s/p DES to LAD.   Depression    Dyspnea    with exertion    ESRD (end stage renal disease) ([place])    hemo TTHSAT   Essential hypertension    FSGS (focal segmental glomerulosclerosis) 2011   By renal biopsy   Head injury    age 89   HFrEF (heart failure with reduced ejection fraction) ([place])    History of kidney stones    kidney stone   Hx of lupus nephritis 2011   by renal biopsy   Ischemic cardiomyopathy    Kidney transplant recipient    LA thrombus 10/2018   Lupus (systemic lupus erythematosus) ([place])    followed by Jenna [name]   Obesity    Orthostatic hypotension    PAF (paroxysmal atrial fibrillation) ([place])    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 remote parietal infarct on brain MRI 12/2019.   Pericarditis    age 73ish    Renal stone    Secondary hyperparathyroidism of renal origin ([place])    SLE (systemic lupus erythematosus) ([name])    TIA (transient ischemic attack)    no residual effects   Allergies  Allergen Reactions   Enalapril Maleate Anaphylaxis, Swelling and Other (See Comments)    Throat swells   Penicillins Other (See Comments)    Made patient lightheaded PATIENT HAS HAD A PCN REACTION WITH IMMEDIATE RASH, FACIAL/TONGUE/THROAT SWELLING, SOB, OR LIGHTHEADEDNESS WITH HYPOTENSION:  #  #  YES  #  #  Has patient had a  PCN reaction causing severe rash involving mucus membranes or skin necrosis: No Has patient had a PCN reaction that required hospitalization: No Has patient had a PCN reaction occurring within the last 10 years: No If all of the above answers are "NO", then may proceed with Cephalosporin use.    Chocolate Nausea And Vomiting   Tape Itching, Rash and Other (See Comments)    Social History   Socioeconomic History   Marital status: Legally Separated    Spouse name: Not on file   Number of children: 1   Years of education: Not on file   Highest education level: Not on file  Occupational History   Not on file  Tobacco Use   Smoking status: Never   Smokeless tobacco: Never  Vaping Use   Vaping Use: Never used  Substance and Sexual Activity   Alcohol use: No   Drug use: No   Sexual activity: Not Currently  Other Topics Concern   Not on file  Social History Narrative   Right Handed   Lives in a two story home   Daughter recently got married    Social Determinants of Health   Financial Resource Strain: Low Risk    Difficulty of Paying Living Expenses: Not very hard  Food Insecurity: No Food Insecurity   Worried About Charity fundraiser in the Last Year: Never true   [place] in the Last Year: Never true  Transportation Needs: No Transportation Needs   Lack of Transportation (Medical): No   Lack of Transportation (Non-Medical): No  Physical Activity:  Sufficiently Active   Days of Exercise per Week: 5 days   Minutes of Exercise per Session: 30 min  Stress: Stress Concern Present   Feeling of Stress : To some extent  Social Connections: Moderately Isolated   Frequency of Communication with Friends and Family: More than three times a week   Frequency of Social Gatherings with Friends and Family: Once a week   Attends Religious Services: 1 to 4 times per year   Active Member of Clubs or Organizations: No   Attends Archivist Meetings: Never   Marital Status: Separated   Vitals:   09/01/20 1540  BP: 130/70  Pulse: (!) 54  Resp: 16  SpO2: 97%   Body mass index is 31.39 kg/m.  Physical Exam Vitals and nursing note reviewed.  Constitutional:      General: She is not in acute distress.    Appearance: She is well-developed.  HENT:     Head: Normocephalic and atraumatic.     Mouth/Throat:     Mouth: Mucous membranes are dry.     Pharynx: Oropharynx is clear.  Eyes:     Conjunctiva/sclera: Conjunctivae normal.  Cardiovascular:     Rate and Rhythm: Bradycardia present. Rhythm irregular. Occasional Extrasystoles are present.    Heart sounds: No murmur heard. Pulmonary:     Effort: Pulmonary effort is normal. No respiratory distress.     Breath sounds: Normal breath sounds.  Abdominal:     Palpations: Abdomen is soft.     Tenderness: There is no abdominal tenderness.  Musculoskeletal:     Right lower leg: No edema.     Left lower leg: No edema.  Lymphadenopathy:     Cervical: No cervical adenopathy.  Skin:    General: Skin is warm.     Findings: Ecchymosis present. No erythema or rash.          Comments: Dry skin,  scaly areas on forearm, scratching during visit.  Neurological:     General: No focal deficit present.     Mental Status: She is alert and oriented to person, place, and time.     Cranial Nerves: No cranial nerve deficit.     Gait: Gait normal.     Comments: She is in a wheel chair, has her cane  with her.  Psychiatric:        Mood and Affect: Mood is anxious.   ASSESSMENT AND PLAN:  [name] was seen today for insomnia.  Diagnoses and all orders for this visit:  Fatigue, unspecified type We discussed possible etiologies: Systemic illness, immunologic,endocrinology,sleep disorder, psychiatric/psychologic, infectious,medications side effects, and idiopathic. Examination today does not suggest a serious process. Some of her chronic medical conditions and medications can be contributing factors. She has lab appt Wednesday.  Insomnia, unspecified type Good sleep hygiene recommended. After discussing some side effects, she would like to try Trazodone.Recommend starting with 1/2 tab.  -     traZODone (DESYREL) 50 MG tablet; Take 0.5-1 tablets (25-50 mg total) by mouth at bedtime as needed for sleep.  Hypertension with renal disease BP adequately controlled today. She has had hypotension in the past. Instructed to monitor BP regularly as well as HR,  SOB (shortness of breath) We discussed differential dx's, HFrEF and deconditioning. Stable. Continue daily activities as tolerated. O2 sat today normal and lung auscultation negative. Instructed about warning signs.  Paroxysmal atrial fibrillation ([name]) Mildly bradycardic. Today We discussed some side effects and benefits of Eliquis. Continue Metoprolol succinate 50 mg daily. Following with cardiologist.  Anxiety disorder, unspecified type She is not interested in resuming Lexapro or Buspar. List of providers in the area given, so she can arrange appt. Trazodone may help.  Polyneuropathy associated with underlying disease ([place]) Gabapentin does not seem to be helping, so recommend to discontinue. Continue appropriate skin care and trauma avoidance.  I spent a total of 40 minutes in both face to face and non face to face activities for this visit on the date of this encounter. During this time history was obtained and  documented, examination was performed, prior labs/imaging reviewed, and assessment/plan discussed. Recommend applying skin moisturizer as needed, Vaseline or Eucerin.  Return in about 6 weeks (around 10/13/2020).   Jenna Haack G. [name], MD  [facility]. [place] office.   A few things to remember from today's visit:   Hypertension with renal disease  SOB (shortness of breath)  Fatigue, unspecified type  Paroxysmal atrial fibrillation ([name])  Anxiety disorder, unspecified type  Polyneuropathy associated with underlying disease ([place])  Insomnia, unspecified type - Plan: traZODone (DESYREL) 50 MG tablet  Fatigue is most likely related to heart disease,frequent hospitalizations,and medication side effects. Ig gabapentin is not helping with neuropathy we need to stop it. Trazodone started today, start 1/2 tab and increase to whole tab. Fall precautions. Fluid restriction: Max 40 oz 91.2 Lt). Monitor blood pressure and heart rate (pulse). Keep appt with nephrologist and cardiologist.  Bruising can be aggravated by eliquis but you need to continue it.  If you need refills please call your pharmacy. Do not use My Chart to request refills or for acute issues that need immediate attention.    Please be sure medication list is accurate. If a new problem present, please set up appointment sooner than planned today.

## 2020-09-04 NOTE — Telephone Encounter (Signed)
Patient called back, states that she will stay where she is for now. She took our number and will call if she wants to switch.   Patient thankful for call back.

## 2020-09-04 NOTE — Telephone Encounter (Signed)
Called patient, LVM to call back if she was still interested in being seen or not, if so we could get her in for an appointment.  Left call back number .

## 2020-09-05 ENCOUNTER — Ambulatory Visit (INDEPENDENT_AMBULATORY_CARE_PROVIDER_SITE_OTHER): Payer: Medicare Other

## 2020-09-05 DIAGNOSIS — I129 Hypertensive chronic kidney disease with stage 1 through stage 4 chronic kidney disease, or unspecified chronic kidney disease: Secondary | ICD-10-CM

## 2020-09-05 DIAGNOSIS — M3214 Glomerular disease in systemic lupus erythematosus: Secondary | ICD-10-CM

## 2020-09-05 DIAGNOSIS — N189 Chronic kidney disease, unspecified: Secondary | ICD-10-CM

## 2020-09-05 DIAGNOSIS — I5023 Acute on chronic systolic (congestive) heart failure: Secondary | ICD-10-CM | POA: Diagnosis not present

## 2020-09-05 DIAGNOSIS — N179 Acute kidney failure, unspecified: Secondary | ICD-10-CM | POA: Diagnosis not present

## 2020-09-05 NOTE — Patient Instructions (Signed)
Visit Information   PATIENT GOALS:   Goals Addressed             This Visit's Progress    RNCM:Manage My Diet   On track    Timeframe:  Long-Range Goal Priority:  Medium Start Date:     09/05/20                        Expected End Date:     01/15/21                  Follow Up Date 09/30/20    - choose foods low in fat and sugar - choose foods that are low in sodium (salt) - eat 2 servings of fruit/vegetables every day - keep healthy snacks on hand - meet with dietitian - read food labels for sodium (salt), fat and sugar content - track weight in diary - watch for swelling in feet, ankles and legs every day - weigh myself daily    Why is this important?   A healthy diet is important for mental and physical health.  Healthy food helps repair damaged body tissue and maintains strong bones and muscles.  No single food is just right so eating a variety of proteins, fruits, vegetables and grains is best.  You may need to change what you eat or drink to manage kidney disease.  A dietitian is the best person to guide you.     Notes:      RNCM:Track and Manage Fluids and Swelling-Heart Failure   On track    Timeframe:  Long-Range Goal Priority:  High Start Date:     09/05/20                        Expected End Date:      01/15/21                 Follow Up Date 09/30/20    - call office if I gain more than 2 pounds in one day or 5 pounds in one week - do ankle pumps when sitting - keep legs up while sitting - meet with dietitian - track weight in diary - use salt in moderation - watch for swelling in feet, ankles and legs every day - weigh myself daily    Why is this important?   It is important to check your weight daily and watch how much salt and liquids you have.  It will help you to manage your heart failure.    Notes:        Heart Failure Action Plan A heart failure action plan helps you understand what to do when you have symptoms of heart failure. Your action  plan is a color-coded plan that lists the symptoms to watch for and indicates what actions to take. If you have symptoms in the red zone, you need medical care right away. If you have symptoms in the yellow zone, you are having problems. If you have symptoms in the green zone, you are doing well. Follow the plan that was created by you and your health care provider. Reviewyour plan each time you visit your health care provider. Red zone These signs and symptoms mean you should get medical help right away: You have trouble breathing when resting. You have a dry cough that is getting worse. You have swelling or pain in your legs or abdomen that is getting worse. You suddenly gain  more than 2-3 lb (0.9-1.4 kg) in 24 hours, or more than 5 lb (2.3 kg) in a week. This amount may be more or less depending on your condition. You have trouble staying awake or you feel confused. You have chest pain. You do not have an appetite. You pass out. You have worsening sadness or depression. If you have any of these symptoms, call your local emergency services (911 in the [place] right away. Do not drive yourself to the hospital. Yellow zone These signs and symptoms mean your condition may be getting worse and you should make some changes: You have trouble breathing when you are active, or you need to sleep with your head raised on extra pillows to help you breathe. You have swelling in your legs or abdomen. You gain 2-3 lb (0.9-1.4 kg) in 24 hours, or 5 lb (2.3 kg) in a week. This amount may be more or less depending on your condition. You get tired easily. You have trouble sleeping. You have a dry cough. If you have any of these symptoms: Contact your health care provider within the next day. Your health care provider may adjust your medicines. Green zone These signs mean you are doing well and can continue what you are doing: You do not have shortness of breath. You have very little swelling or no new  swelling. Your weight is stable (no gain or loss). You have a normal activity level. You do not have chest pain or any other new symptoms. Follow these instructions at home: Take over-the-counter and prescription medicines only as told by your health care provider. Weigh yourself daily. Your target weight is __________ lb (__________ kg). Call your health care provider if you gain more than __________ lb (__________ kg) in 24 hours, or more than __________ lb (__________ kg) in a week. Health care provider name: _____________________________________________________ Health care provider phone number: _____________________________________________________ Eat a heart-healthy diet. Work with a diet and nutrition specialist (dietitian) to create an eating plan that is best for you. Keep all follow-up visits. This is important. Where to find more information American Heart Association: www.heart.org Summary A heart failure action plan helps you understand what to do when you have symptoms of heart failure. Follow the action plan that was created by you and your health care provider. Get help right away if you have any symptoms in the red zone. This information is not intended to replace advice given to you by your health care provider. Make sure you discuss any questions you have with your healthcare provider. Document Revised: 09/17/2019 Document Reviewed: 09/17/2019 Elsevier Patient Education  2022 [employer].  Consent to CCM Services: Jenna [name] was given information about Chronic Care Management services today including:  CCM service includes personalized support from designated clinical staff supervised by her physician, including individualized plan of care and coordination with other care providers 24/7 contact phone numbers for assistance for urgent and routine care needs. Service will only be billed when office clinical staff spend 20 minutes or more in a month to coordinate  care. Only one practitioner may furnish and bill the service in a calendar month. The patient may stop CCM services at any time (effective at the end of the month) by phone call to the office staff. The patient will be responsible for cost sharing (co-pay) of up to 20% of the service fee (after annual deductible is met).  Patient agreed to services and verbal consent obtained.   The patient verbalized understanding of instructions, educational  materials, and care plan provided today and agreed to receive a mailed copy of patient instructions, educational materials, and care plan.   Telephone follow up appointment with care management team member scheduled for:  Peter Garter RN, [facility], CDE Care Management Coordinator [facility] [phone], Mobile [phone]   CLINICAL CARE PLAN: Patient Care Plan: RNCM:Heart Failure (Adult)     Problem Identified: Lack of long-term self-management of Heart Failure   Priority: High     Long-Range Goal: Effective self-management of Heart Failure   Start Date: 09/05/2020  Expected End Date: 01/15/2021  This Visit's Progress: On track  Priority: High  Note:   Current Barriers:  Knowledge deficits related to basic heart failure pathophysiology and self care management with hx of CKD,  Lupus, kidney transplant 2020, HTN, paroxysmal Atrial Fibrillation  Unable to independently self manage heart failure Lacks social connections Unable to perform ADLs independently Lack of scale in home received scale yesterday Financial strain States early in morning is not a good time to talk to her so call limited this morning.  States she has needed more help since she has been in the hospital so frequently.  States she got a scale and B/P monitor delivered to her house yesterday.  States that she is getting home health therapy and the social worker has been helping her apply of more assistance with helping her take care of herself .   Denies any shortness of breath or increased swelling this morning.  States she is seen by providers at [facility]  for her kidneys, heart, psychiatry and nutrition. States her medication co pays are adding up which is making it difficult for her to afford. Nurse Case Manager Clinical Goal(s):  patient will weigh self daily and record patient will verbalize understanding of Heart Failure Action Plan and when to call doctor patient will take all Heart Failure mediations as prescribed Interventions:  Collaboration with [name], Betty G, MD regarding development and update of comprehensive plan of care as evidenced by provider attestation and co-signature Inter-disciplinary care team collaboration (see longitudinal plan of care) Basic overview and discussion of pathophysiology of Heart Failure Provided written and verbal education on low sodium diet Reviewed Heart Failure Action Plan in depth and provided written copy Assessed for scales in home-received yesterday  Discussed importance of daily weight Reviewed role of diuretics in prevention of fluid overload Referred to CCM pharmacist to assist with cost of medications  Referral to CCM LCSW deferred today as she is working with home health social worker Engineer, structural  calendar with heart failure zones Self-Care Activities:  Takes Heart Failure Medications as prescribed Weighs daily and record (notifying MD of 3 lb weight gain over night or 5 lb in a week) Verbalizes understanding of and follows CHF Action Plan Adheres to low sodium diet  Patient Goals:  - Take Heart Failure Medications as prescribed - Weigh daily and record (notify MD with 3 lb weight gain over night or 5 lb in a week) - Follow CHF Action Plan - Adhere to low sodium diet - develop a rescue plan - eat more whole grains, fruits and vegetables, lean meats and healthy fats - follow rescue plan if symptoms flare-up - know when to call the doctor - track  symptoms and what helps feel better or worse - dress right for the weather, hot or cold - pace activity allowing for rest - do ankle pumps when sitting - keep legs up while sitting - meet  with dietitian - track weight in diary - use salt in moderation - watch for swelling in feet, ankles and legs every day - weigh myself daily Follow Up Plan: Telephone follow up appointment with care management team member scheduled for: 09/30/20 at 1 PM The patient has been provided with contact information for the care management team and has been advised to call with any health related questions or concerns.      Patient Care Plan: RNCM:Chronic Kidney (Adult)     Problem Identified: Disease Progression   Priority: Medium     Long-Range Goal: Disease Progression Prevented or Minimized   Start Date: 09/05/2020  Expected End Date: 01/15/2021  This Visit's Progress: On track  Priority: Medium  Note:   Current Barriers:  Ineffective Self Health Maintenance in a patient with CKD Stage 4 with hx of transplant Unable to independently self manage CKD stage 4 Unable to perform ADLs independently States she has to watch the amount of fluids she drinks and the salt in her diet.  Pt had visit with RD at [facility] on 09/04/20 Clinical Goal(s):  Collaboration with [name], Betty G, MD regarding development and update of comprehensive plan of care as evidenced by provider attestation and co-signature Inter-disciplinary care team collaboration (see longitudinal plan of care) patient will work with care management team to address care coordination and chronic disease management needs related to Disease Management Educational Needs Care Coordination   Interventions:  Evaluation of current treatment plan related to CHF, HTN, CKD Stage 4, and lupus , ADL IADL limitations and Social Isolation self-management and patient's adherence to plan as established by provider. Collaboration with [name], Betty G, MD regarding  development and update of comprehensive plan of care as evidenced by provider attestation       and co-signature Inter-disciplinary care team collaboration (see longitudinal plan of care) Discussed plans with patient for ongoing care management follow up and provided patient with direct contact information for care management team Reviewed importance of following fluid restrictions and renal diet  Self Care Activities:  Patient verbalizes understanding of plan to self manage CKD with hx of transplant Self administers medications as prescribed Attends all scheduled provider appointments Calls pharmacy for medication refills Performs ADL's independently Calls provider office for new concerns or questions Patient Goals: - choose foods low in fat and sugar - choose foods that are low in sodium (salt) - eat 2 servings of fruit/vegetables every day - keep healthy snacks on hand - meet with dietitian - read food labels for sodium (salt), fat and sugar content - track weight in diary - watch for swelling in feet, ankles and legs every day - weigh myself daily Follow Up Plan: Telephone follow up appointment with care management team member scheduled for: The patient has been provided with contact information for the care management team and has been advised to call with any health related questions or concerns.

## 2020-09-05 NOTE — Chronic Care Management (AMB) (Signed)
Chronic Care Management   CCM RN Visit Note  09/05/2020 Name: Jenna [name] MRN: [id number] DOB: 1955-04-20  Subjective: Jenna [name] is a 65 y.o. year old female who is a primary care patient of [name], [name], MD. The care management team was consulted for assistance with disease management and care coordination needs.    Engaged with patient by telephone for initial visit in response to provider referral for case management and/or care coordination services.   Consent to Services:  The patient was given the following information about Chronic Care Management services today, agreed to services, and gave verbal consent: 1. CCM service includes personalized support from designated clinical staff supervised by the primary care provider, including individualized plan of care and coordination with other care providers 2. 24/7 contact phone numbers for assistance for urgent and routine care needs. 3. Service will only be billed when office clinical staff spend 20 minutes or more in a month to coordinate care. 4. Only one practitioner may furnish and bill the service in a calendar month. 5.The patient may stop CCM services at any time (effective at the end of the month) by phone call to the office staff. 6. The patient will be responsible for cost sharing (co-pay) of up to 20% of the service fee (after annual deductible is met). Patient agreed to services and consent obtained.  Patient agreed to services and verbal consent obtained.   Assessment: Review of patient past medical history, allergies, medications, health status, including review of consultants reports, laboratory and other test data, was performed as part of comprehensive evaluation and provision of chronic care management services.   SDOH (Social Determinants of Health) assessments and interventions performed:  SDOH Interventions    Flowsheet Row Most Recent Value  SDOH Interventions   Food Insecurity Interventions Intervention  Not Indicated  Housing Interventions Intervention Not Indicated  Transportation Interventions Intervention Not Indicated        CCM Care Plan  Allergies  Allergen Reactions   Enalapril Maleate Anaphylaxis, Swelling and Other (See Comments)    Throat swells   Penicillins Other (See Comments)    Made patient lightheaded PATIENT HAS HAD A PCN REACTION WITH IMMEDIATE RASH, FACIAL/TONGUE/THROAT SWELLING, SOB, OR LIGHTHEADEDNESS WITH HYPOTENSION:  #  #  YES  #  #  Has patient had a PCN reaction causing severe rash involving mucus membranes or skin necrosis: No Has patient had a PCN reaction that required hospitalization: No Has patient had a PCN reaction occurring within the last 10 years: No If all of the above answers are "NO", then may proceed with Cephalosporin use.    Chocolate Nausea And Vomiting   Tape Itching, Rash and Other (See Comments)    Outpatient Encounter Medications as of 09/05/2020  Medication Sig Note   acetaminophen (TYLENOL) 500 MG tablet Take 1,000 mg by mouth every 6 (six) hours as needed for headache.    aspirin 81 MG EC tablet Take 81 mg by mouth every morning.    hydroxychloroquine (PLAQUENIL) 200 MG tablet Take 1 tablet (200 mg total) by mouth 2 (two) times daily.    isosorbide-hydrALAZINE (BIDIL) 20-37.5 MG tablet Take 1 tablet by mouth 3 (three) times daily.    lidocaine (LIDODERM) 5 % Place 1 patch onto the skin daily. Remove & Discard patch within 12 hours or as directed by MD    metoprolol succinate (TOPROL-XL) 50 MG 24 hr tablet Take 1 tablet (50 mg total) by mouth daily. Take with or immediately following  a meal.    nitroGLYCERIN (NITROSTAT) 0.4 MG SL tablet Place 0.4 mg under the tongue every 5 (five) minutes as needed for chest pain.     pantoprazole (PROTONIX) 40 MG tablet Take 40 mg by mouth daily.    predniSONE (DELTASONE) 5 MG tablet Take 5 mg by mouth daily with lunch. 06/22/2019: Continuous course   rosuvastatin (CRESTOR) 5 MG tablet Take 5 mg by  mouth daily at 6 PM.    simethicone (MYLICON) 80 MG chewable tablet Chew 80 mg by mouth 4 (four) times daily as needed for flatulence.    tacrolimus (PROGRAF) 0.5 MG capsule Take 0.5-1 mg by mouth See admin instructions. Take 1 capsule (0.5 mg) by mouth every morning and 2 capsules (1 mg) at night    torsemide 40 MG TABS Take 40 mg by mouth 2 (two) times daily.    traZODone (DESYREL) 50 MG tablet Take 0.5-1 tablets (25-50 mg total) by mouth at bedtime as needed for sleep.    valGANciclovir (VALCYTE) 450 MG tablet Take 450 mg by mouth every other day.    No facility-administered encounter medications on file as of 09/05/2020.    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   Elevated troponin 07/11/2020   Anemia of chronic disease 07/11/2020   Acute kidney injury superimposed on chronic kidney disease ([place]) 07/11/2020   Allergic rhinitis 01/01/2020   Anxiety disorder 06/26/2019   CAD (coronary artery disease) 03/29/2019   Paroxysmal atrial fibrillation ([place]) March 29, 2019   Deceased-donor kidney transplant 03/27/2018   Dyslipidemia 11/06/2017   CKD (chronic kidney disease) stage 5, GFR less than 15 ml/min ([name]) 02/15/2017   Back pain at L4-L5 level 10/31/2014   Muscle spasm of back 10/31/2014   Visit for screening mammogram 09/30/2014   Chronic maxillary sinusitis 07/04/2014   Occipital headache 07/04/2014   Increased urinary frequency 05/23/2014   Falls 05/23/2014   Rash and nonspecific skin eruption 05/23/2014   Hypertension with renal disease 04/16/2014   Encounter for immunization 11/19/2013   Lupus (systemic lupus erythematosus) ([place]) 09/24/2013   TIA (transient ischemic attack) 05/05/2013   Numbness and tingling of left side of face 05/04/2013   Lupus ([place]) 04/15/2011   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    Conditions to be addressed/monitored:Atrial Fibrillation, CHF, HTN, CKD Stage  , and lupus  Care Plan : RNCM:Heart Failure (Adult)  Updates made by Dimitri Ped, RN since 09/05/2020 12:00 AM     Problem: Lack of long-term self-management of Heart Failure   Priority: High     Long-Range Goal: Effective self-management of Heart Failure   Start Date: 09/05/2020  Expected End Date: 01/15/2021  This Visit's Progress: On track  Priority: High  Note:   Current Barriers:  Knowledge deficits related to basic heart failure pathophysiology and self care management with hx of CKD,  Lupus, kidney transplant 2020, HTN, paroxysmal Atrial Fibrillation  Unable to independently self manage heart failure Lacks social connections Unable to perform ADLs independently Lack of scale in home received scale yesterday Financial strain States early in morning is not a good time to talk to her [name] call limited this morning.  States she has needed more help since she has been in the hospital [name] frequently.  States she got a scale and  B/P monitor delivered to her house yesterday.  States that she is getting home health therapy and the social worker has been helping her apply of more assistance with helping her take care of herself .  Denies any shortness of breath or increased swelling this morning.  States she is seen by providers at [facility]  for her kidneys, heart, psychiatry and nutrition. States her medication co pays are adding up which is making it difficult for her to afford. Nurse Case Manager Clinical Goal(s):  patient will weigh self daily and record patient will verbalize understanding of Heart Failure Action Plan and when to call doctor patient will take all Heart Failure mediations as prescribed Interventions:  Collaboration with [name], Betty G, MD regarding development and update of comprehensive plan of care as evidenced by  provider attestation and co-signature Inter-disciplinary care team collaboration (see longitudinal plan of care) Basic overview and discussion of pathophysiology of Heart Failure Provided written and verbal education on low sodium diet Reviewed Heart Failure Action Plan in depth and provided written copy Assessed for scales in home-received yesterday  Discussed importance of daily weight Reviewed role of diuretics in prevention of fluid overload Referred to CCM pharmacist to assist with cost of medications  Referral to CCM LCSW deferred today as she is working with home health social worker Engineer, structural  calendar with heart failure zones Self-Care Activities:  Takes Heart Failure Medications as prescribed Weighs daily and record (notifying MD of 3 lb weight gain over night or 5 lb in a week) Verbalizes understanding of and follows CHF Action Plan Adheres to low sodium diet  Patient Goals:  - Take Heart Failure Medications as prescribed - Weigh daily and record (notify MD with 3 lb weight gain over night or 5 lb in a week) - Follow CHF Action Plan - Adhere to low sodium diet - develop a rescue plan - eat more whole grains, fruits and vegetables, lean meats and healthy fats - follow rescue plan if symptoms flare-up - know when to call the doctor - track symptoms and what helps feel better or worse - dress right for the weather, hot or cold - pace activity allowing for rest - do ankle pumps when sitting - keep legs up while sitting - meet with dietitian - track weight in diary - use salt in moderation - watch for swelling in feet, ankles and legs every day - weigh myself daily Follow Up Plan: Telephone follow up appointment with care management team member scheduled for: 09/30/20 at 1 PM The patient has been provided with contact information for the care management team and has been advised to call with any health related questions or concerns.      Care Plan :  RNCM:Chronic Kidney (Adult)  Updates made by Dimitri Ped, RN since 09/05/2020 12:00 AM     Problem: Disease Progression   Priority: Medium     Long-Range Goal: Disease Progression Prevented or Minimized   Start Date: 09/05/2020  Expected End Date: 01/15/2021  This Visit's Progress: On track  Priority: Medium  Note:   Current Barriers:  Ineffective Self Health Maintenance in a patient with CKD Stage 4 with hx of transplant Unable to independently self manage CKD stage 4 Unable to perform ADLs independently States she has to watch the amount of fluids she drinks and the salt in her diet.  Pt had visit with RD at [facility] on [facility]/04/20 Clinical Goal(s):  Collaboration with [name], Betty G, MD  regarding development and update of comprehensive plan of care as evidenced by provider attestation and co-signature Inter-disciplinary care team collaboration (see longitudinal plan of care) patient will work with care management team to address care coordination and chronic disease management needs related to Disease Management Educational Needs Care Coordination   Interventions:  Evaluation of current treatment plan related to CHF, HTN, CKD Stage 4, and lupus , ADL IADL limitations and Social Isolation self-management and patient's adherence to plan as established by provider. Collaboration with [name], Betty G, MD regarding development and update of comprehensive plan of care as evidenced by provider attestation       and co-signature Inter-disciplinary care team collaboration (see longitudinal plan of care) Discussed plans with patient for ongoing care management follow up and provided patient with direct contact information for care management team Reviewed importance of following fluid restrictions and renal diet  Self Care Activities:  Patient verbalizes understanding of plan to self manage CKD with hx of transplant Self administers medications as prescribed Attends all scheduled  provider appointments Calls pharmacy for medication refills Performs ADL's independently Calls provider office for new concerns or questions Patient Goals: - choose foods low in fat and sugar - choose foods that are low in sodium (salt) - eat 2 servings of fruit/vegetables every day - keep healthy snacks on hand - meet with dietitian - read food labels for sodium (salt), fat and sugar content - track weight in diary - watch for swelling in feet, ankles and legs every day - weigh myself daily Follow Up Plan: Telephone follow up appointment with care management team member scheduled for: The patient has been provided with contact information for the care management team and has been advised to call with any health related questions or concerns.       Plan:Telephone follow up appointment with care management team member scheduled for:  09/30/20 and The patient has been provided with contact information for the care management team and has been advised to call with any health related questions or concerns.  Peter Garter RN, Jackquline Denmark, CDE Care Management Coordinator [facility] [phone], Mobile [phone]

## 2020-09-08 ENCOUNTER — Telehealth: Payer: Self-pay | Admitting: Family Medicine

## 2020-09-08 ENCOUNTER — Other Ambulatory Visit: Payer: Self-pay

## 2020-09-08 DIAGNOSIS — R202 Paresthesia of skin: Secondary | ICD-10-CM

## 2020-09-08 DIAGNOSIS — R200 Anesthesia of skin: Secondary | ICD-10-CM

## 2020-09-08 NOTE — Telephone Encounter (Signed)
I called and spoke with patient and her daughter (verified with DPR.) Patient is experiencing some shortness of breath at night time when she lays down and has been coughing up phlegm. Advised patient and daughter that it could be allergy related and she is not on any medications that have the medications she is allergic to in it. When I spoke with patient, she was not having any shortness of breath or feeling like she couldn't breathe.   Patient is still experiencing pain and burning in her feet, it is getting worse. The gabapentin was not helping and patient has seen neurology already with no help. Patient and her daughter are wondering if there are any other options for medications to help with the pain and burning?

## 2020-09-08 NOTE — Telephone Encounter (Signed)
I spoke with patient's daughter. A new referral to neurology has been placed and she is aware they will receive a phone call to schedule the appointment.

## 2020-09-08 NOTE — Telephone Encounter (Signed)
I left patient's daughter a voicemail to return my call.  No other medications can be used for patient due to her kidneys, next option would be trying to get an appointment with a different neurologist.

## 2020-09-08 NOTE — Telephone Encounter (Signed)
Patient was transferred to triage for chief complaint: ---Caller states she wants to know if her doctor can give her medication with enalapril in it. She stated she can't breath.Caller states her throat is closing up on her left side. She is short of breath  Per Triage: Time) Disposition Final User 09/08/2020 8:50:13 AM Send to Urgent Lenward Chancellor 09/08/2020 8:59:25 AM 911 Outcome Documentation Conner, RN, Lesa Reason: refuses 09/08/2020 9:00:08 AM Clinical Call Yes Conner, RN, Lesa Comments User: Starleen Blue, RN Date/Time Eilene Ghazi Time): 09/08/2020 8:59:04 AM Advised caller to hang up and call 911 right now. She refuses. Caller verbalizes understanding Caller disconnected prior to closing script

## 2020-09-16 NOTE — Progress Notes (Signed)
This patient insurance is not covered she would be responsible for 20%. Also not on NPL would need referral.  Thank you  Tati

## 2020-09-18 IMAGING — US US BREAST*R* LIMITED INC AXILLA
1 series · 3 of 3 positions shown · non-contrast
Comparison: Previous exam(s).

CLINICAL DATA: Patient recalled from screening for possible right
breast distortion.

EXAM:
DIGITAL DIAGNOSTIC RIGHT MAMMOGRAM WITH CAD AND TOMO
ULTRASOUND RIGHT BREAST

[Series 1: us breast*right* limited inc axilla · 0.06mm/px · 3 of 3 slices shown]
[im 1/3]
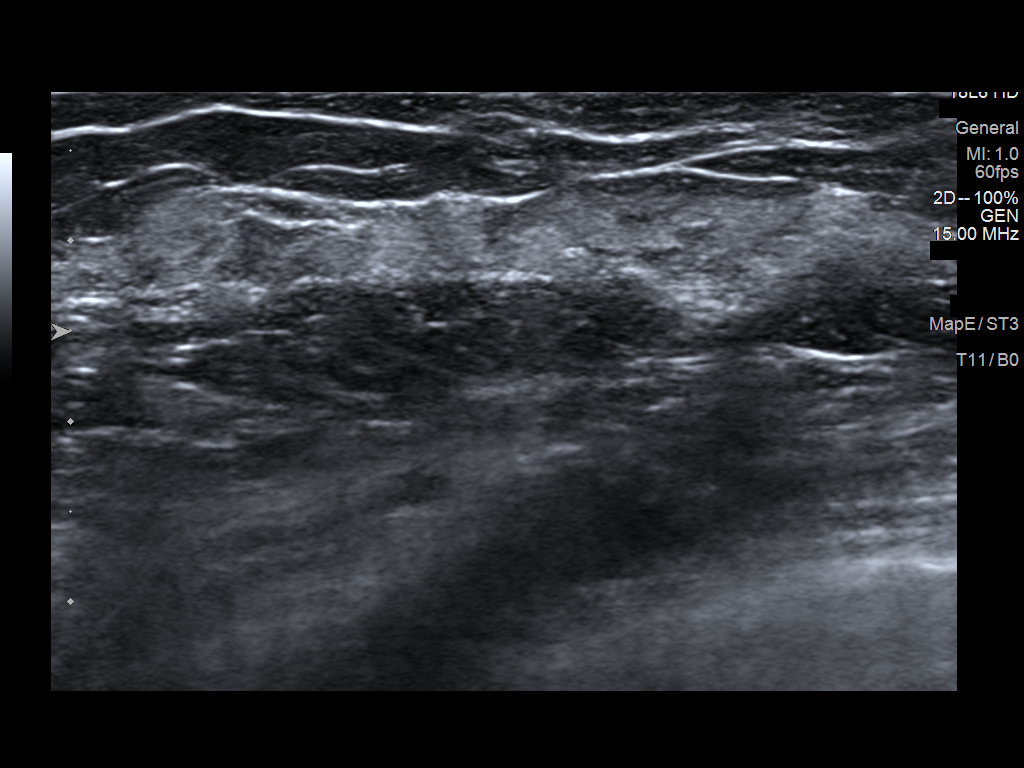
[im 2/3]
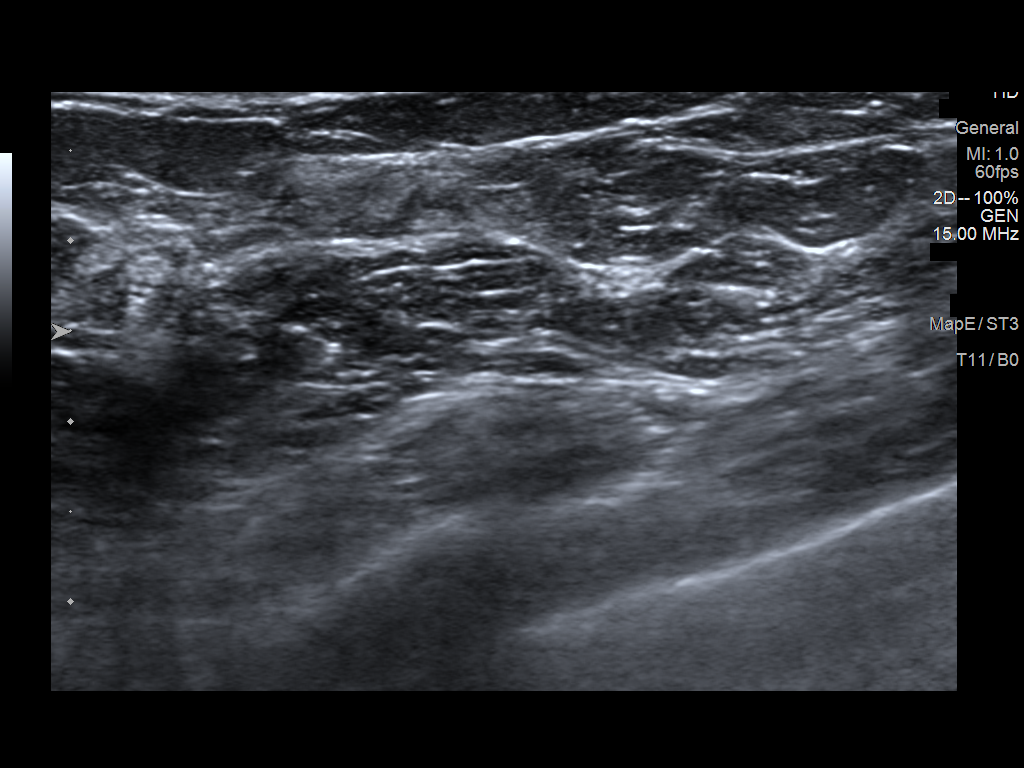
[im 3/3]
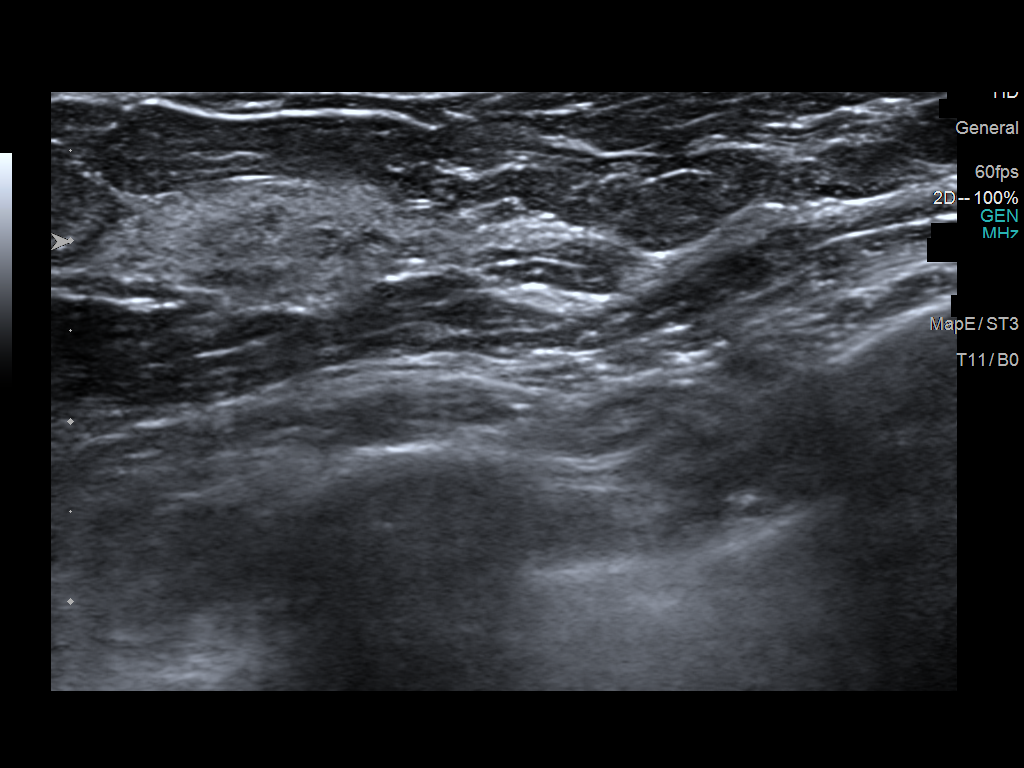

[3 of 3 positions shown; findings below may reference images not displayed]

ACR Breast Density Category c: The breast tissue is heterogeneously
dense, which may obscure small masses.
FINDINGS: Questioned distortion within the inferior slightly lateral right
breast anterior depth appeared to resolve with additional imaging
including spot compression CC tomosynthesis images. Given the
extreme density in this location, an ultrasound will be performed.

Mammographic images were processed with CAD.

Targeted ultrasound is performed, showing dense tissue without
suspicious mass within the lower outer right breast anterior depth.
IMPRESSION: No mammographic evidence for malignancy.

RECOMMENDATION:
Screening mammogram in one year.(Code:[id number])

I have discussed the findings and recommendations with the patient.
If applicable, a reminder letter will be sent to the patient
regarding the next appointment.

BI-RADS CATEGORY  1: Negative.

## 2020-09-22 ENCOUNTER — Telehealth: Payer: Self-pay | Admitting: Family Medicine

## 2020-09-22 NOTE — Telephone Encounter (Signed)
PT called to see if Dr.Jordan can fax the paperwork for the PCA to Dr.Krystal at [facility] that way they can complete it for her to send to Medicare/Medicaid as she needs it done asap. Please call PT when its been sent over.

## 2020-09-22 NOTE — Telephone Encounter (Signed)
I faxed the paperwork over as pt requested. I called and spoke with patient. She is needing a new form completed by [name] as well. I will fill this out, send her a copy and get it faxed to Medicaid.

## 2020-09-23 ENCOUNTER — Encounter: Payer: Self-pay | Admitting: Family Medicine

## 2020-09-23 ENCOUNTER — Other Ambulatory Visit (HOSPITAL_COMMUNITY): Payer: Self-pay | Admitting: *Deleted

## 2020-09-23 ENCOUNTER — Telehealth (INDEPENDENT_AMBULATORY_CARE_PROVIDER_SITE_OTHER): Payer: Medicare Other | Admitting: Family Medicine

## 2020-09-23 VITALS — Ht 67.0 in

## 2020-09-23 DIAGNOSIS — R5383 Other fatigue: Secondary | ICD-10-CM

## 2020-09-23 DIAGNOSIS — R079 Chest pain, unspecified: Secondary | ICD-10-CM

## 2020-09-23 DIAGNOSIS — N185 Chronic kidney disease, stage 5: Secondary | ICD-10-CM | POA: Diagnosis not present

## 2020-09-23 DIAGNOSIS — G629 Polyneuropathy, unspecified: Secondary | ICD-10-CM | POA: Diagnosis not present

## 2020-09-23 DIAGNOSIS — I5020 Unspecified systolic (congestive) heart failure: Secondary | ICD-10-CM

## 2020-09-23 NOTE — Progress Notes (Signed)
Virtual Visit via Telephone Note I connected with Jenna [name] on 09/23/20 at  4:00 PM EDT by telephone and verified that I am speaking with the correct person using two identifiers.   I discussed the limitations, risks, security and privacy concerns of performing an evaluation and management service by telephone and the availability of in person appointments. I also discussed with the patient that there may be a patient responsible charge related to this service. The patient expressed understanding and agreed to proceed.  Location patient: home Location provider: work or home office Participants present for the call: patient, daughter,provider Patient did not have a visit in the prior 7 days to address this/these issue(s).  Chief Complaint  Patient presents with   Medication Management   History of Present Illness: Jenna [name] is a 64 y.o.female with hx of HFrEF with LVEF 20-25% (08/2020),CKD, s/p kidney transplant, paroxysmal atrial fib,and lupus whose daughter has some questions about her medications. Thre are some medications that are not on her med list and daughter wonders why she is not longer on them. Bactrim and Myfortic. The latter one was help after recent hospitalization because CMV infection. Jenna [name] does not remember who d/c Bactrim, which she was taking 3 times per week.  She is asking for how long she is going to be treated for CMV, she is on Valganciclovir 50 mg  every other day. Also if she should be taking Bidil 20-37.5 mg tid, this one was added after last hospitalization,08/2020. She is tolerating well, no side effects reported. S/P renal transplant: She is on Tacrolimus. Lupus: She is on Prednisone and Plaquenil.  Jenna [name] is reporting that She is on Torsemide 40 mg 3 tabs daily. She has an appt tomorrow for iron infusion.  She is very frustrated because appts and medications changes.  States that she cannot keep up with medications, this is "taking a  toll on me." States that she needs somebody "to listen and say I understand and I am going to help." She does not have transportation . Anxiety: She has not established with new psychiatrist.  Still having SOB and has had 2 episodes of CP in the past few days, improved with sublingual nitroglycerin.  + Fatigue. She has not been able to sleep at all. Last visit she started Trazodone 50 mg. Feet pain is interfering with sleep. States that her rheumatologist prescribed Hydrocodone-acetaminophen 7.5-325 mg but it causes nausea and abdominal pain. No hx of trauma. Peripheral neuropathy, Gabapentin does not help.   Observations/Objective: Patient sounds cheerful and well on the phone. I do not appreciate any SOB. Speech and thought processing are grossly intact.Anxious. Patient reported vitals:Ht 5\' 7"  (1.702 m)   BMI 31.39 kg/m   Assessment and Plan:  1. Fatigue, unspecified type Chronic medical problems and medications can be contributing factors. Very anxious, recommend arranging appt with psychiatrist. She is not sleeping well due to pain.  2. CKD (chronic kidney disease) stage 5, GFR less than 15 ml/min ([name]) S/P renal transplant. Following with nephrologist.  Immunosuppressive state, Bactrim was held after hospital discharge, 07/22/20 until following with transplant team. Quant PCR CMV gradually decreasing, last one 07/22/20 was 641 (1150,2190,6750,12.300,180.000). Continue Valganciclovir 450 mg q 2 days.  3. HFrEF (heart failure with reduced ejection fraction) ([place]) Last echo 08/19/20 LVEF 20-25%. She is not on Torsemide 40 mg 3 times daily. Continue Bidil 20-37.5 mg tid. Continue low salt diet and fluid restriction.  4. Peripheral polyneuropathy Pain is interfering with sleep.  Recommend trying Hydrocodone-Acetaminophen 7.5-325 mg 1/2 tab at bedtime instead 1 tab. We discussed some side effects of medications  5. Chest pain, unspecified type This has been a recurrent problem,  we discussed possible etiologies. STEMI s/p PCI 10/2019. Instructed about warning signs. Keep appt with cardiologist.  Reiterated the important of taking all her medications as instructed. We reviewed meds and indications. Some medications were held after hospital discharges and need to be discussed with nephrologist/transplant team. I have a from to be completed requesting her health insurance to provide assistance with transportation and ADL's. Form filled out and signed, ready to be faxed.  Follow Up Instructions:  Return if symptoms worsen or fail to improve, for Keep next appt..  I did not refer this patient for an OV in the next 24 hours for this/these issue(s).  I discussed the assessment and treatment plan with the patient. The patient was provided an opportunity to ask questions and all were answered. The patient agreed with the plan and demonstrated an understanding of the instructions.   The patient was advised to call back or seek an in-person evaluation if the symptoms worsen or if the condition fails to improve as anticipated.  I provided 29 minutes of non-face-to-face time during this encounter.   Jenna Barcelo [name], MD

## 2020-09-23 NOTE — Telephone Encounter (Signed)
Form faxed with confirmation & copy mailed to pt.

## 2020-09-23 NOTE — Telephone Encounter (Signed)
Form awaiting PCP's signature.

## 2020-09-24 ENCOUNTER — Encounter (HOSPITAL_COMMUNITY)
Admission: RE | Admit: 2020-09-24 | Discharge: 2020-09-24 | Disposition: A | Discharge status: Home / Self Care | Payer: Medicare Other | Source: Ambulatory Visit | Attending: Internal Medicine | Admitting: Internal Medicine

## 2020-09-24 ENCOUNTER — Other Ambulatory Visit: Payer: Self-pay

## 2020-09-24 DIAGNOSIS — N189 Chronic kidney disease, unspecified: Secondary | ICD-10-CM | POA: Diagnosis present

## 2020-09-24 DIAGNOSIS — D631 Anemia in chronic kidney disease: Secondary | ICD-10-CM | POA: Diagnosis not present

## 2020-09-24 MED ORDER — FERUMOXYTOL INJECTION 510 MG/17 ML
510.0000 mg | INTRAVENOUS | Status: DC
  Administered 2020-09-24: 510 mg via INTRAVENOUS
  Filled 2020-09-24: qty 510

## 2020-09-30 ENCOUNTER — Telehealth: Payer: Self-pay | Admitting: Family Medicine

## 2020-09-30 ENCOUNTER — Ambulatory Visit (INDEPENDENT_AMBULATORY_CARE_PROVIDER_SITE_OTHER): Payer: Medicare Other

## 2020-09-30 DIAGNOSIS — I5023 Acute on chronic systolic (congestive) heart failure: Secondary | ICD-10-CM

## 2020-09-30 DIAGNOSIS — I480 Paroxysmal atrial fibrillation: Secondary | ICD-10-CM

## 2020-09-30 DIAGNOSIS — I5020 Unspecified systolic (congestive) heart failure: Secondary | ICD-10-CM

## 2020-09-30 DIAGNOSIS — I129 Hypertensive chronic kidney disease with stage 1 through stage 4 chronic kidney disease, or unspecified chronic kidney disease: Secondary | ICD-10-CM | POA: Diagnosis not present

## 2020-09-30 DIAGNOSIS — N185 Chronic kidney disease, stage 5: Secondary | ICD-10-CM

## 2020-09-30 DIAGNOSIS — F419 Anxiety disorder, unspecified: Secondary | ICD-10-CM

## 2020-09-30 NOTE — Patient Instructions (Signed)
Visit Information  PATIENT GOALS:  Goals Addressed             This Visit's Progress    RNCM:Manage My Diet   On track    Timeframe:  Long-Range Goal Priority:  Medium Start Date:     09/05/20                        Expected End Date:     01/15/21                  Follow Up Date 11/13/20    - choose foods low in fat and sugar - choose foods that are low in sodium (salt) - eat 2 servings of fruit/vegetables every day - keep healthy snacks on hand - meet with dietitian - read food labels for sodium (salt), fat and sugar content - track weight in diary - watch for swelling in feet, ankles and legs every day - weigh myself daily    Why is this important?   A healthy diet is important for mental and physical health.  Healthy food helps repair damaged body tissue and maintains strong bones and muscles.  No single food is just right so eating a variety of proteins, fruits, vegetables and grains is best.  You may need to change what you eat or drink to manage kidney disease.  A dietitian is the best person to guide you.     Notes:      RNCM:Track and Manage Fluids and Swelling-Heart Failure   On track    Timeframe:  Long-Range Goal Priority:  High Start Date:     09/05/20                        Expected End Date:      01/15/21                 Follow Up Date 11/13/20    - call office if I gain more than 2 pounds in one day or 5 pounds in one week - do ankle pumps when sitting - keep legs up while sitting - meet with dietitian - track weight in diary - use salt in moderation - watch for swelling in feet, ankles and legs every day - weigh myself daily    Why is this important?   It is important to check your weight daily and watch how much salt and liquids you have.  It will help you to manage your heart failure.    Notes:         Patient verbalizes understanding of instructions provided today and agrees to view in [place].   Telephone follow up appointment with care  management team member scheduled for: 11/13/20 at 3:30PM  Peter Garter RN, [facility], CDE Care Management Coordinator [place] [phone], Mobile [phone]

## 2020-09-30 NOTE — Chronic Care Management (AMB) (Signed)
Chronic Care Management   CCM RN Visit Note  09/30/2020 Name: Jenna [name] MRN: [id number] DOB: 1955/06/20  Subjective: Jenna [name] is a 65 y.o. year old female who is a primary care patient of [name], [name], MD. The care management team was consulted for assistance with disease management and care coordination needs.    Engaged with patient by telephone for follow up visit in response to provider referral for case management and/or care coordination services.   Consent to Services:  The patient was given information about Chronic Care Management services, agreed to services, and gave verbal consent prior to initiation of services.  Please see initial visit note for detailed documentation.   Patient agreed to services and verbal consent obtained.   Assessment: Review of patient past medical history, allergies, medications, health status, including review of consultants reports, laboratory and other test data, was performed as part of comprehensive evaluation and provision of chronic care management services.   SDOH (Social Determinants of Health) assessments and interventions performed:    CCM Care Plan  Allergies  Allergen Reactions   Enalapril Maleate Anaphylaxis, Swelling and Other (See Comments)    Throat swells   Penicillins Other (See Comments)    Made patient lightheaded PATIENT HAS HAD A PCN REACTION WITH IMMEDIATE RASH, FACIAL/TONGUE/THROAT SWELLING, SOB, OR LIGHTHEADEDNESS WITH HYPOTENSION:  #  #  YES  #  #  Has patient had a PCN reaction causing severe rash involving mucus membranes or skin necrosis: No Has patient had a PCN reaction that required hospitalization: No Has patient had a PCN reaction occurring within the last 10 years: No If all of the above answers are "NO", then may proceed with Cephalosporin use.    Chocolate Nausea And Vomiting   Tape Itching, Rash and Other (See Comments)    Outpatient Encounter Medications as of 09/30/2020  Medication  Sig Note   acetaminophen (TYLENOL) 500 MG tablet Take 1,000 mg by mouth every 6 (six) hours as needed for headache.    aspirin 81 MG EC tablet Take 81 mg by mouth every morning.    gabapentin (NEURONTIN) 100 MG capsule Take 100 mg by mouth daily.    hydroxychloroquine (PLAQUENIL) 200 MG tablet Take 1 tablet (200 mg total) by mouth 2 (two) times daily.    isosorbide-hydrALAZINE (BIDIL) 20-37.5 MG tablet Take 1 tablet by mouth 3 (three) times daily.    lidocaine (LIDODERM) 5 % Place 1 patch onto the skin daily. Remove & Discard patch within 12 hours or as directed by MD    metoprolol succinate (TOPROL-XL) 50 MG 24 hr tablet Take 1 tablet (50 mg total) by mouth daily. Take with or immediately following a meal.    nitroGLYCERIN (NITROSTAT) 0.4 MG SL tablet Place 0.4 mg under the tongue every 5 (five) minutes as needed for chest pain.     pantoprazole (PROTONIX) 40 MG tablet Take 40 mg by mouth daily.    predniSONE (DELTASONE) 5 MG tablet Take 5 mg by mouth daily with lunch. 06/22/2019: Continuous course   rosuvastatin (CRESTOR) 5 MG tablet Take 5 mg by mouth daily at 6 PM.    simethicone (MYLICON) 80 MG chewable tablet Chew 80 mg by mouth 4 (four) times daily as needed for flatulence.    tacrolimus (PROGRAF) 0.5 MG capsule Take 0.5-1 mg by mouth See admin instructions. Take 1 capsule (0.5 mg) by mouth every morning and 2 capsules (1 mg) at night    torsemide 40 MG TABS Take 40  mg by mouth 2 (two) times daily.    traZODone (DESYREL) 50 MG tablet Take 0.5-1 tablets (25-50 mg total) by mouth at bedtime as needed for sleep.    valGANciclovir (VALCYTE) 450 MG tablet Take 450 mg by mouth every other day.    No facility-administered encounter medications on file as of 09/30/2020.    Patient Active Problem List   Diagnosis Date Noted   Acute on chronic systolic CHF (congestive heart failure) ([place]) 07/11/2020   Acute respiratory failure with hypoxia ([place]) 07/11/2020   Cytomegalovirus (CMV) viremia ([place])  07/11/2020   Elevated troponin 07/11/2020   Anemia of chronic disease 07/11/2020   Acute kidney injury superimposed on chronic kidney disease ([place]) 07/11/2020   Allergic rhinitis 01/01/2020   Anxiety disorder 06/26/2019   CAD (coronary artery disease) 2019-03-28   Paroxysmal atrial fibrillation ([place]) 28-Mar-2019   Deceased-donor kidney transplant 03/27/2018   Dyslipidemia 11/06/2017   CKD (chronic kidney disease) stage 5, GFR less than 15 ml/min ([name]) 02/15/2017   Back pain at L4-L5 level 10/31/2014   Muscle spasm of back 10/31/2014   Visit for screening mammogram 09/30/2014   Chronic maxillary sinusitis 07/04/2014   Occipital headache 07/04/2014   Increased urinary frequency 05/23/2014   Falls 05/23/2014   Rash and nonspecific skin eruption 05/23/2014   Hypertension with renal disease 04/16/2014   Encounter for immunization 11/19/2013   Lupus (systemic lupus erythematosus) ([place]) 09/24/2013   TIA (transient ischemic attack) 05/05/2013   Numbness and tingling of left side of face 05/04/2013   Lupus ([place]) 04/15/2011   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    Conditions to be addressed/monitored:Atrial Fibrillation, CHF, CAD, HTN, CKD Stage 5, Anxiety, and lupus  Care Plan : RNCM:Heart Failure (Adult)  Updates made by Dimitri Ped, RN since 09/30/2020 12:00 AM     Problem: Lack of long-term self-management of Heart Failure   Priority: High     Long-Range Goal: Effective self-management of Heart Failure   Start Date: 09/05/2020  Expected End Date: 01/15/2021  This Visit's Progress: On track  Recent Progress: On track  Priority: High  Note:   Current Barriers:  Knowledge deficits related to basic heart failure pathophysiology and self care management with hx of CKD,  Lupus, kidney transplant 2020, HTN, paroxysmal Atrial Fibrillation  Unable to independently self manage heart failure Lacks social connections Unable to  perform ADLs independently Financial strain  States she has needed more help since she has been in the hospital so frequently.  States that she is still getting home health therapy and the social worker has been helping her apply of more assistance with helping her take care of herself .  Denies any shortness of breath or increased swelling today. States she has been weighting everyday.   States she is seen by providers at [facility]  for her kidneys, heart, psychiatry and nutrition. States that she wants to get a new psychiatrist  Nurse Case Manager Clinical Goal(s):  patient will weigh self daily and record patient will verbalize understanding of Heart Failure Action Plan and when to call doctor patient will take all Heart Failure mediations as prescribed Interventions:  Collaboration with [name], Betty G, MD regarding development and update of comprehensive plan of care as evidenced by provider attestation and co-signature Inter-disciplinary care team collaboration (see longitudinal plan of care) Basic overview and discussion of pathophysiology of Heart Failure Reviewed to follow low sodium diet Reinforced Heart Failure Action Plan  Assessed  for scales in home-using scale Reinforced importance of daily weight Reinforced role of diuretics in prevention of fluid overload Referred to CCM pharmacist to assist with cost of medications-pending  Referral to CCM LCSW to assist pt to find new mental health provider/psychiatrist Self-Care Activities:  Takes Heart Failure Medications as prescribed Weighs daily and record (notifying MD of 3 lb weight gain over night or 5 lb in a week) Verbalizes understanding of and follows CHF Action Plan Adheres to low sodium diet  Patient Goals:  - Take Heart Failure Medications as prescribed - Weigh daily and record (notify MD with 3 lb weight gain over night or 5 lb in a week) - Follow CHF Action Plan - Adhere to low sodium diet - develop a rescue plan -  eat more whole grains, fruits and vegetables, lean meats and healthy fats - follow rescue plan if symptoms flare-up - know when to call the doctor - track symptoms and what helps feel better or worse - dress right for the weather, hot or cold - pace activity allowing for rest - do ankle pumps when sitting - keep legs up while sitting - meet with dietitian - track weight in diary - use salt in moderation - watch for swelling in feet, ankles and legs every day - weigh myself daily Follow Up Plan: Telephone follow up appointment with care management team member scheduled for: 11/13/20 at 3:30 PM The patient has been provided with contact information for the care management team and has been advised to call with any health related questions or concerns.      Care Plan : RNCM:Chronic Kidney (Adult)  Updates made by Dimitri Ped, RN since 09/30/2020 12:00 AM     Problem: Disease Progression   Priority: Medium     Long-Range Goal: Disease Progression Prevented or Minimized   Start Date: 09/05/2020  Expected End Date: 01/15/2021  This Visit's Progress: On track  Recent Progress: On track  Priority: Medium  Note:   Current Barriers:  Ineffective Self Health Maintenance in a patient with CKD Stage 4 with hx of transplant Unable to independently self manage CKD stage 4 Unable to perform ADLs independently States she has to watch the amount of fluids she drinks and the salt in her diet.  Pt states she is tried a lot.  States that the iron injection did help her feel a little better.  States she does not have transportation to her injection appointment tomorrow Clinical Goal(s):  Collaboration with [name], Betty G, MD regarding development and update of comprehensive plan of care as evidenced by provider attestation and co-signature Inter-disciplinary care team collaboration (see longitudinal plan of care) patient will work with care management team to address care coordination and  chronic disease management needs related to Disease Management Educational Needs Care Coordination   Interventions:  Evaluation of current treatment plan related to CHF, HTN, CKD Stage 4, and lupus , ADL IADL limitations and Social Isolation self-management and patient's adherence to plan as established by provider. Collaboration with [name], Betty G, MD regarding development and update of comprehensive plan of care as evidenced by provider attestation       and co-signature Inter-disciplinary care team collaboration (see longitudinal plan of care) Discussed plans with patient for ongoing care management follow up and provided patient with direct contact information for care management team Reinforced importance of following fluid restrictions and renal diet  Referral made to care guide for urgent request for transportation to infusion clinic 10/01/20 Self  Care Activities:  Patient verbalizes understanding of plan to self manage CKD with hx of transplant Self administers medications as prescribed Attends all scheduled provider appointments Calls pharmacy for medication refills Performs ADL's independently Calls provider office for new concerns or questions Patient Goals: - choose foods low in fat and sugar - choose foods that are low in sodium (salt) - eat 2 servings of fruit/vegetables every day - keep healthy snacks on hand - meet with dietitian - read food labels for sodium (salt), fat and sugar content - track weight in diary - watch for swelling in feet, ankles and legs every day - weigh myself daily Follow Up Plan: Telephone follow up appointment with care management team member scheduled for: 11/13/20 at 3:30 PM The patient has been provided with contact information for the care management team and has been advised to call with any health related questions or concerns.       Plan:Telephone follow up appointment with care management team member scheduled for:  11/13/20 and The  patient has been provided with contact information for the care management team and has been advised to call with any health related questions or concerns.  Peter Garter RN, [facility] [place], CDE Care Management Coordinator [facility] [phone], Mobile [phone]

## 2020-09-30 NOTE — Telephone Encounter (Signed)
   Telephone encounter was:  Successful.  09/30/2020 Name: ARTHURINE OLEARY MRN: [id number] DOB: 19-Oct-1955  Jayme Cloud Hanko is a 65 y.o. year old female who is a primary care patient of [name], [name], MD . The community resource team was consulted for assistance with Transportation Needs   Care guide performed the following interventions: Patient provided with information about care guide support team and interviewed to confirm resource needs Discussed resources to assist with ride to [place] for her appointment tomorrow 8/17 at 10 am. Called patient twice, no answer. I called daughter and she confirmed her mom's appointment and that she needed transportation. I set up ride with [employer], called daughter back to confirm they will be at [place] at 9:15 for pick up. She will make sure she is ready to go  .  Follow Up Plan:  No further follow up planned at this time. The patient has been provided with needed resources.  April Green Care Guide, Embedded Care Coordination [place], Care Management Phone: [phone] Email: [email or web address]

## 2020-10-01 ENCOUNTER — Encounter (HOSPITAL_COMMUNITY)
Admission: RE | Admit: 2020-10-01 | Discharge: 2020-10-01 | Disposition: A | Discharge status: Home / Self Care | Payer: Medicare Other | Source: Ambulatory Visit | Attending: Internal Medicine | Admitting: Internal Medicine

## 2020-10-01 ENCOUNTER — Other Ambulatory Visit: Payer: Self-pay

## 2020-10-01 DIAGNOSIS — N189 Chronic kidney disease, unspecified: Secondary | ICD-10-CM | POA: Diagnosis not present

## 2020-10-01 MED ORDER — FERUMOXYTOL INJECTION 510 MG/17 ML
510.0000 mg | INTRAVENOUS | Status: AC
  Administered 2020-10-01: 510 mg via INTRAVENOUS
  Filled 2020-10-01: qty 510

## 2020-10-08 ENCOUNTER — Telehealth: Payer: Self-pay

## 2020-10-08 NOTE — Telephone Encounter (Signed)
I left patient a voicemail letting her know to continue taking the extra torsemide as Dr. Lamonte Sakai instructed, but to let us know if she is not any better by Friday; she may need to be seen to see where the swelling is coming from.

## 2020-10-08 NOTE — Telephone Encounter (Signed)
[facility] Night - Client TELEPHONE ADVICE RECORD AccessNurse Patient Name: Jenna [name] [employer] Gender: Female DOB: 08-Mar-1955 Age: 65 Y 10 D Return Phone Number: [phone] (Primary) Address: City/ State/ Zip: [place]  [address] Client [facility] Night - Client Client Site [place] - Night Physician [name], Betty - MD Contact Type Call Who Is Calling Patient / Member / Family / Caregiver Call Type Triage / Clinical Relationship To Patient Self Return Phone Number [phone] (Primary) Chief Complaint Feet swelling Reason for Call Symptomatic / Request for [place] states she wants to go over her medication. She believes she is taking too much. Translation No Nurse Assessment Nurse: Linward Headland, RN, Ana Date/Time (Eastern Time): 10/07/2020 6:52:50 PM Confirm and document reason for call. If symptomatic, describe symptoms. ---Caller states her legs are swollen and in pain. States it helps to not move much and elevating her legs. She is concerned that she is taking too much medication because she was having hallucinations 2 days ago. States 3 days ago she had some vomiting and abdominal pain and she believes it is due to the medication. Does the patient have any new or worsening symptoms? ---Yes Will a triage be completed? ---Yes Related visit to physician within the last 2 weeks? ---No Does the PT have any chronic conditions? (i.e. diabetes, asthma, this includes High risk factors for pregnancy, etc.) ---Yes List chronic conditions. ---heart attack, kidney transplant, lupus Is this a behavioral health or substance abuse call? ---No Guidelines Guideline Title Affirmed Question Affirmed Notes Nurse Date/Time Eilene Ghazi Time) Leg Swelling and Edema Looks like a boil, infected sore, deep ulcer or other infected rash (spreading redness, pus) Linward Headland, RN, Wilhemena Durie 10/07/2020  6:59:26 PM PLEASE NOTE: All timestamps contained within this report are represented as [place] Standard Time. CONFIDENTIALTY NOTICE: This fax transmission is intended only for the addressee. It contains information that is legally privileged, confidential or otherwise protected from use or disclosure. If you are not the intended recipient, you are strictly prohibited from reviewing, disclosing, copying using or disseminating any of this information or taking any action in reliance on or regarding this information. If you have received this fax in error, please notify us immediately by telephone so that we can arrange for its return to [name]. Phone: [phone], Toll-Free: [phone], Fax: [phone] Page: 2 of 2 Call Id: [id number] [place]. Time Eilene Ghazi Time) Disposition Final User 10/07/2020 7:08:46 PM See PCP within 24 Hours Yes Linward Headland, RN, [employer] Disagree/Comply Comply Caller Understands Yes PreDisposition Did not know what to do Care Advice Given Per Guideline SEE PCP WITHIN 24 HOURS: * IF OFFICE WILL BE OPEN: You need to be examined within the next 24 hours. Call your doctor (or NP/PA) when the office opens and make an appointment. CALL BACK IF: * Fever occurs * You become worse CARE ADVICE given per Leg Swelling and Edema (Adult) guideline. Comments User: Jodelle Green, RN Date/Time Eilene Ghazi Time): 10/07/2020 6:59:21 PM Low ejection fraction, states it is about 25%. User: Jodelle Green, RN Date/Time Eilene Ghazi Time): 10/07/2020 7:07:20 PM States she is having pins and needles and burning in her feet. Referrals REFERRED TO PCP OFFICE

## 2020-10-13 ENCOUNTER — Encounter: Payer: Self-pay | Admitting: Family Medicine

## 2020-10-13 ENCOUNTER — Telehealth (INDEPENDENT_AMBULATORY_CARE_PROVIDER_SITE_OTHER): Payer: Medicare Other | Admitting: Family Medicine

## 2020-10-13 ENCOUNTER — Telehealth: Payer: Self-pay | Admitting: Family Medicine

## 2020-10-13 VITALS — Ht 67.0 in

## 2020-10-13 DIAGNOSIS — F419 Anxiety disorder, unspecified: Secondary | ICD-10-CM

## 2020-10-13 DIAGNOSIS — M3214 Glomerular disease in systemic lupus erythematosus: Secondary | ICD-10-CM | POA: Diagnosis not present

## 2020-10-13 DIAGNOSIS — G6289 Other specified polyneuropathies: Secondary | ICD-10-CM

## 2020-10-13 DIAGNOSIS — J309 Allergic rhinitis, unspecified: Secondary | ICD-10-CM

## 2020-10-13 DIAGNOSIS — I2510 Atherosclerotic heart disease of native coronary artery without angina pectoris: Secondary | ICD-10-CM

## 2020-10-13 DIAGNOSIS — G4700 Insomnia, unspecified: Secondary | ICD-10-CM

## 2020-10-13 DIAGNOSIS — I5020 Unspecified systolic (congestive) heart failure: Secondary | ICD-10-CM | POA: Diagnosis not present

## 2020-10-13 DIAGNOSIS — I129 Hypertensive chronic kidney disease with stage 1 through stage 4 chronic kidney disease, or unspecified chronic kidney disease: Secondary | ICD-10-CM | POA: Diagnosis not present

## 2020-10-13 DIAGNOSIS — E785 Hyperlipidemia, unspecified: Secondary | ICD-10-CM

## 2020-10-13 DIAGNOSIS — B259 Cytomegaloviral disease, unspecified: Secondary | ICD-10-CM

## 2020-10-13 DIAGNOSIS — Z940 Kidney transplant status: Secondary | ICD-10-CM | POA: Diagnosis not present

## 2020-10-13 DIAGNOSIS — G629 Polyneuropathy, unspecified: Secondary | ICD-10-CM | POA: Insufficient documentation

## 2020-10-13 MED ORDER — GABAPENTIN 100 MG PO CAPS: 200.0000 mg | ORAL_CAPSULE | Freq: Every day | ORAL | 0 refills | Status: DC

## 2020-10-13 NOTE — Assessment & Plan Note (Signed)
Continue prednisone 5 mg daily and hydrochloric cane 200 mg twice daily. Following with rheumatologist.

## 2020-10-13 NOTE — Assessment & Plan Note (Signed)
Reporting no symptoms. She would like to discontinue cetirizine 10 mg. Instructed to resume medication if she becomes symptomatic.

## 2020-10-13 NOTE — Assessment & Plan Note (Signed)
Seems to be tolerating medication well. Continue BiDil 20-37.5 mg 3 times daily and metoprolol succinate 50 mg daily. Low-salt diet. Monitor BP regularly.

## 2020-10-13 NOTE — Assessment & Plan Note (Addendum)
She has been evaluated by neurologist, Dr. Posey Pronto (02/2020).  According to note EMG was declined. She is tolerating gabapentin well, recommend increasing dose from 100 mg to 200 mg for 10 days and then continue with gabapentin 300 mg (she still has this at home). We discussed some side effects. Appropriate foot care. Follow-up in 2 to 3 months, before if needed.

## 2020-10-13 NOTE — Telephone Encounter (Signed)
Patient called nurse line on 10/10/20 at 5:06 pm.  She states that she had SOB and foot swelling.  She couldn't remember if she took her aspirin and wanted to take another one.  She was triaged by the nurse.

## 2020-10-13 NOTE — Assessment & Plan Note (Signed)
Continue rosuvastatin 5 mg daily. We discussed CV benefits of statins.

## 2020-10-13 NOTE — Assessment & Plan Note (Signed)
Strongly recommend keeping appointment with new provider. Continue torsemide 20 mg 3 times per day, metoprolol succinate 50 mg daily, and BiDil 20-37.5 mg 3 times daily. Low-salt diet and daily weight.

## 2020-10-13 NOTE — Progress Notes (Addendum)
Virtual Visit via Video Note I connected with Jenna [name] on 10/13/20 by a video enabled telemedicine application and verified that I am speaking with the correct person using two identifiers.  Location patient: home Location provider:work office Persons participating in the virtual visit: patient, provider  I discussed the limitations of evaluation and management by telemedicine and the availability of in person appointments. The patient expressed understanding and agreed to proceed.  Chief Complaint  Patient presents with   Referral    cardiology   HPI: Jenna [name] is a 65 year old female with history of hypertension, CAD, anxiety, lupus, ESRD s/p renal transplant, and atrial fibrillation who is being seen today because she would like to go through all her medication and discussed indications. She is also concerned about next appointment with cardiologist, on 10/24/2020 she will be seeing a new provider and she is anxious about this. She has not had chest pain, palpitation. No changes in chronic DOE. + Fatigue.  Hypertension, atrial fibrillation, and HFrEF: She is no longer on Eliquis. She is on BiDil 20-37.5 mg 3 times daily and metoprolol succinate 50 mg daily. She denies checking BP. She has questions about torsemide, she is not on 40 mg, she is on 20 mg 3 times per day.  Lupus on prednisone 5 mg daily and Plaquenil 200 mg twice daily. She follows with rheumatologist.  Lupus nephritis, s/p renal transplant, she has appointment with the transplant team tomorrow. Currently she is on tacrolimus 0.5 mg 1 tablet in the morning and 2 tablets at bedtime.  Asking about indication for cetirizine 10 mg, she is not having rhinorrhea or nasal congestion. Protonix 40 mg, which she is not taking.  She is not having heartburn or abdominal pain. Flatulence, for which she is taking simethicone 80 mg every 6 hours as needed. No changes in bowel habits, nausea, or vomiting.  Insomnia: She is on  trazodone 50 mg, which she does not think it is helping much.  She is taking naps throughout the day. At night she wakes up a few times through the night. Trazodone has not helped with anxiety.. She still has not established with psychiatrist. Anxiety and depression exacerbated by health concerns. Negative for suicidal thoughts.  Peripheral neuropathy: A few months ago she reported that gabapentin was not helping, numbness, tingling, and sometimes burning of feet that interferes with his sleep.  She recently resumed gabapentin 100 mg and it seems to be helping some. She was on gabapentin 300 mg daily, which she tolerated well. She has been evaluated by neurologist, Dr. Posey Pronto.  Last visit on 02/22/2020.  She is also inquiring about Valcyte 450 mg, which she is taking every other day.  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 CMV PCR quant is trending down, last done in 07/2020.  ROS: See pertinent positives and negatives per HPI.  Past Medical History:  Diagnosis Date   Anemia    Anxiety    panic attack- talks herself and takes deep breathes   CAD (coronary artery disease)    a. STEMI 10/2018 s/p DES to LAD.   Depression    Dyspnea    with exertion    ESRD (end stage renal disease) ([name])    hemo TTHSAT   Essential hypertension    FSGS (focal segmental glomerulosclerosis) 2011   By renal biopsy   Head injury    age 58   HFrEF (heart failure with reduced ejection fraction) ([place])    History of  kidney stones    kidney stone   Hx of lupus nephritis 2011   by renal biopsy   Ischemic cardiomyopathy    Kidney transplant recipient    LA thrombus 10/2018   Lupus (systemic lupus erythematosus) ([place])    followed by Dr. 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 remote parietal infarct on brain MRI 12/2019.   Pericarditis    age 67ish   Renal  stone    Secondary hyperparathyroidism of renal origin ([place])    SLE (systemic lupus erythematosus) ([name])    TIA (transient ischemic attack)    no residual effects    Past Surgical History:  Procedure Laterality Date   AV FISTULA PLACEMENT Left 07/04/2017   Procedure: ARTERIOVENOUS (AV) FISTULA CREATION BRACHIOCEPHALIC;  Surgeon: [name], MD;  Location: [place] OR;  Service: Vascular;  Laterality: Left;   COLONOSCOPY W/ POLYPECTOMY     IR FLUORO GUIDE CV LINE RIGHT  06/28/2017   IR US GUIDE VASC ACCESS RIGHT  06/28/2017   KIDNEY SURGERY     kidney transplant 2020   REVISION OF ARTERIOVENOUS GORETEX GRAFT Left 11/30/2017   Procedure: REVISION OF ARTERIOVENOUS FISTULA LEFT ARM Superfistulization and branch ligation.;  Surgeon: Waynetta Sandy, MD;  Location: [place];  Service: Vascular;  Laterality: Left;    Family History  Problem Relation Age of Onset   Diabetes Mother    Hypertension Father    Cancer Sister    Hypertension Brother     Social History   Socioeconomic History   Marital status: Legally Separated    Spouse name: Not on file   Number of children: 1   Years of education: Not on file   Highest education level: Not on file  Occupational History   Not on file  Tobacco Use   Smoking status: Never   Smokeless tobacco: Never  Vaping Use   Vaping Use: Never used  Substance and Sexual Activity   Alcohol use: No   Drug use: No   Sexual activity: Not Currently  Other Topics Concern   Not on file  Social History Narrative   Right Handed   Lives in a two story home   Daughter recently got married    Social Determinants of Health   Financial Resource Strain: Low Risk    Difficulty of Paying Living Expenses: Not very hard  Food Insecurity: No Food Insecurity   Worried About Charity fundraiser in the Last Year: Never true   [place] in the Last Year: Never true  Transportation Needs: No Transportation Needs   Lack of Transportation (Medical): No    Lack of Transportation (Non-Medical): No  Physical Activity: Sufficiently Active   Days of Exercise per Week: 5 days   Minutes of Exercise per Session: 30 min  Stress: Stress Concern Present   Feeling of Stress : To some extent  Social Connections: Moderately Isolated   Frequency of Communication with Friends and Family: More than three times a week   Frequency of Social Gatherings with Friends and Family: Once a week   Attends Religious Services: 1 to 4 times per year   Active Member of [employer] or Organizations: No   Attends Archivist Meetings: Never   Marital Status: Separated  Intimate Partner Violence: Not At Risk   Fear of Current or Ex-Partner: No   Emotionally  Abused: No   Physically Abused: No   Sexually Abused: No    Current Outpatient Medications:    acetaminophen (TYLENOL) 500 MG tablet, Take 1,000 mg by mouth every 6 (six) hours as needed for headache., Disp: , Rfl:    aspirin 81 MG EC tablet, Take 81 mg by mouth every morning., Disp: , Rfl:    hydroxychloroquine (PLAQUENIL) 200 MG tablet, Take 1 tablet (200 mg total) by mouth 2 (two) times daily., Disp: 180 tablet, Rfl: 3   isosorbide-hydrALAZINE (BIDIL) 20-37.5 MG tablet, Take 1 tablet by mouth 3 (three) times daily., Disp: 90 tablet, Rfl: 0   lidocaine (LIDODERM) 5 %, Place 1 patch onto the skin daily. Remove & Discard patch within 12 hours or as directed by MD, Disp: , Rfl:    metoprolol succinate (TOPROL-XL) 50 MG 24 hr tablet, Take 1 tablet (50 mg total) by mouth daily. Take with or immediately following a meal., Disp: 30 tablet, Rfl: 0   nitroGLYCERIN (NITROSTAT) 0.4 MG SL tablet, Place 0.4 mg under the tongue every 5 (five) minutes as needed for chest pain. , Disp: , Rfl:    pantoprazole (PROTONIX) 40 MG tablet, Take 40 mg by mouth daily., Disp: , Rfl:    predniSONE (DELTASONE) 5 MG tablet, Take 5 mg by mouth daily with lunch., Disp: , Rfl:    rosuvastatin (CRESTOR) 5 MG tablet, Take 5 mg by mouth daily  at 6 PM., Disp: , Rfl:    simethicone (MYLICON) 80 MG chewable tablet, Chew 80 mg by mouth 4 (four) times daily as needed for flatulence., Disp: , Rfl:    tacrolimus (PROGRAF) 0.5 MG capsule, Take 0.5-1 mg by mouth See admin instructions. Take 1 capsule (0.5 mg) by mouth every morning and 2 capsules (1 mg) at night, Disp: , Rfl:    torsemide (DEMADEX) 20 MG tablet, Take 20 mg by mouth in the morning, at noon, and at bedtime., Disp: , Rfl:    traZODone (DESYREL) 50 MG tablet, Take 0.5-1 tablets (25-50 mg total) by mouth at bedtime as needed for sleep., Disp: 30 tablet, Rfl: 1   valGANciclovir (VALCYTE) 450 MG tablet, Take 450 mg by mouth every other day., Disp: , Rfl:    gabapentin (NEURONTIN) 100 MG capsule, Take 2 capsules (200 mg total) by mouth daily., Disp: 20 capsule, Rfl: 0  EXAM:  VITALS per patient if applicable:Ht $RemoveBeforeDE'5\' 7"'[id number]$  (1.702 m)   BMI 31.39 kg/m   GENERAL: alert, oriented, appears well and in no acute distress  HEENT: atraumatic, conjunctiva clear, no obvious abnormalities on inspection.  NECK: normal movements of the head and neck  LUNGS: on inspection no signs of respiratory distress, breathing rate appears normal, no obvious gross SOB, gasping or wheezing  CV: no obvious cyanosis  MS: moves all visible extremities without noticeable abnormality  PSYCH/NEURO: pleasant and cooperative, no obvious depression, + anxious. Speech and thought processing grossly intact. Repeats questions a few times.  ASSESSMENT AND PLAN:  Discussed the following assessment and plan:  Hypertension with renal disease Seems to be tolerating medication well. Continue BiDil 20-37.5 mg 3 times daily and metoprolol succinate 50 mg daily. Low-salt diet. Monitor BP regularly.  Lupus (systemic lupus erythematosus) ([name]) Continue prednisone 5 mg daily and hydrochloric cane 200 mg twice daily. Following with rheumatologist.  HFrEF (heart failure with reduced ejection fraction) ([place]) Strongly  recommend keeping appointment with new provider. Continue torsemide 20 mg 3 times per day, metoprolol succinate 50 mg daily, and BiDil 20-37.5 mg 3  times daily. Low-salt diet and daily weight.  Dyslipidemia Continue rosuvastatin 5 mg daily. We discussed CV benefits of statins.  Cytomegalovirus (CMV) viremia ([name]) Continue Valcyte 450 mg every other day. She has appointment with transplant team provider/nephrologist tomorrow.  Allergic rhinitis Reporting no symptoms. She would like to discontinue cetirizine 10 mg. Instructed to resume medication if she becomes symptomatic.  Deceased-donor kidney transplant Continue tacrolimus 0.5 mg in the morning and 1 mg at night and prednisone 5 mg daily. Following with nephrologist/transplant team.  Anxiety disorder Problem is not well controlled. Phone number of [facility] given, so she can arrange appointment.  We will also mail a list of healthcare providers in the area. Continue trazodone 50 mg at bedtime.  Peripheral neuropathy She has been evaluated by neurologist, Dr. Posey Pronto (02/2020).  According to note EMG was declined. She is tolerating gabapentin well, recommend increasing dose from 100 mg to 200 mg for 10 days and then continue with gabapentin 300 mg (she still has this at home). We discussed some side effects. Appropriate foot care. Follow-up in 2 to 3 months, before if needed.  Insomnia, unspecified type She is trying to improve his sleep hygiene, recommend avoiding naps in the afternoon. She would like to continue trazodone 50 mg. Adequate sleep hygiene.  I spent a total of 42 minutes in both face to face through video visit and non face to face activities for this visit on the date of this encounter. During this time history was obtained and documented, examination was performed, prior labs/notes reviewed, and assessment/plan discussed.   We discussed possible serious and likely etiologies, options  for evaluation and workup, limitations of telemedicine visit vs in person visit, treatment, treatment risks and precautions.  I discussed the assessment and treatment plan with the patient. Jenna [name] was provided an opportunity to ask questions and all were answered. The patient agreed with the plan and demonstrated an understanding of the instructions.   Return in about 3 months (around 01/13/2021) for Peripheral neuropathy and HTN.Marland Kitchen    Deidre Carino [name], MD

## 2020-10-13 NOTE — Assessment & Plan Note (Signed)
Problem is not well controlled. Phone number of [facility] given, so she can arrange appointment.  We will also mail a list of healthcare providers in the area. Continue trazodone 50 mg at bedtime.

## 2020-10-13 NOTE — Assessment & Plan Note (Signed)
Continue Valcyte 450 mg every other day. She has appointment with transplant team provider/nephrologist tomorrow.

## 2020-10-13 NOTE — Assessment & Plan Note (Signed)
Continue tacrolimus 0.5 mg in the morning and 1 mg at night and prednisone 5 mg daily. Following with nephrologist/transplant team.

## 2020-10-13 NOTE — Telephone Encounter (Signed)
Noted, patient has f/u with pcp today.

## 2020-10-15 ENCOUNTER — Other Ambulatory Visit: Payer: Self-pay

## 2020-10-15 ENCOUNTER — Encounter (HOSPITAL_COMMUNITY): Payer: Self-pay | Admitting: Emergency Medicine

## 2020-10-15 ENCOUNTER — Emergency Department (HOSPITAL_COMMUNITY)
Admission: EM | Admit: 2020-10-15 | Discharge: 2020-10-16 | Disposition: A | Discharge status: Home / Self Care | Payer: Medicare Other | Attending: Emergency Medicine | Admitting: Emergency Medicine

## 2020-10-15 ENCOUNTER — Emergency Department (HOSPITAL_COMMUNITY): Payer: Medicare Other

## 2020-10-15 DIAGNOSIS — N186 End stage renal disease: Secondary | ICD-10-CM | POA: Insufficient documentation

## 2020-10-15 DIAGNOSIS — I5023 Acute on chronic systolic (congestive) heart failure: Secondary | ICD-10-CM | POA: Insufficient documentation

## 2020-10-15 DIAGNOSIS — M7989 Other specified soft tissue disorders: Secondary | ICD-10-CM

## 2020-10-15 DIAGNOSIS — R600 Localized edema: Secondary | ICD-10-CM | POA: Diagnosis not present

## 2020-10-15 DIAGNOSIS — Z20822 Contact with and (suspected) exposure to covid-19: Secondary | ICD-10-CM | POA: Diagnosis not present

## 2020-10-15 DIAGNOSIS — I2510 Atherosclerotic heart disease of native coronary artery without angina pectoris: Secondary | ICD-10-CM | POA: Diagnosis not present

## 2020-10-15 DIAGNOSIS — R0602 Shortness of breath: Secondary | ICD-10-CM | POA: Diagnosis not present

## 2020-10-15 DIAGNOSIS — Z7982 Long term (current) use of aspirin: Secondary | ICD-10-CM | POA: Insufficient documentation

## 2020-10-15 DIAGNOSIS — Z79899 Other long term (current) drug therapy: Secondary | ICD-10-CM | POA: Insufficient documentation

## 2020-10-15 DIAGNOSIS — I132 Hypertensive heart and chronic kidney disease with heart failure and with stage 5 chronic kidney disease, or end stage renal disease: Secondary | ICD-10-CM | POA: Diagnosis not present

## 2020-10-15 DIAGNOSIS — N184 Chronic kidney disease, stage 4 (severe): Secondary | ICD-10-CM

## 2020-10-15 LAB — COMPREHENSIVE METABOLIC PANEL
ALT: 33 U/L (ref 0–44)
AST: 36 U/L (ref 15–41)
Albumin: 3.2 g/dL — ABNORMAL LOW (ref 3.5–5.0)
Alkaline Phosphatase: 118 U/L (ref 38–126)
Anion gap: 12 (ref 5–15)
BUN: 56 mg/dL — ABNORMAL HIGH (ref 8–23)
CO2: 24 mmol/L (ref 22–32)
Calcium: 9.6 mg/dL (ref 8.9–10.3)
Chloride: 96 mmol/L — ABNORMAL LOW (ref 98–111)
Creatinine, Ser: 3.33 mg/dL — ABNORMAL HIGH (ref 0.44–1.00)
GFR, Estimated: 15 mL/min — ABNORMAL LOW (ref >60)
Glucose, Bld: 93 mg/dL (ref 70–99)
Potassium: 4.3 mmol/L (ref 3.5–5.1)
Sodium: 132 mmol/L — ABNORMAL LOW (ref 135–145)
Total Bilirubin: 1.1 mg/dL (ref 0.3–1.2)
Total Protein: 6.7 g/dL (ref 6.5–8.1)

## 2020-10-15 LAB — TROPONIN I (HIGH SENSITIVITY): Troponin I (High Sensitivity): 96 ng/L — ABNORMAL HIGH (ref <18)

## 2020-10-15 LAB — CBC
HCT: 33.4 % — ABNORMAL LOW (ref 36.0–46.0)
Hemoglobin: 10.3 g/dL — ABNORMAL LOW (ref 12.0–15.0)
MCH: 28.4 pg (ref 26.0–34.0)
MCHC: 30.8 g/dL (ref 30.0–36.0)
MCV: 92 fL (ref 80.0–100.0)
Platelets: 122 10*3/uL — ABNORMAL LOW (ref 150–400)
RBC: 3.63 MIL/uL — ABNORMAL LOW (ref 3.87–5.11)
RDW: 22.5 % — ABNORMAL HIGH (ref 11.5–15.5)
WBC: 2.9 10*3/uL — ABNORMAL LOW (ref 4.0–10.5)
nRBC: 0 % (ref 0.0–0.2)

## 2020-10-15 LAB — BRAIN NATRIURETIC PEPTIDE: B Natriuretic Peptide: >4500 pg/mL — ABNORMAL HIGH (ref 0.0–100.0)

## 2020-10-15 LAB — RESP PANEL BY RT-PCR (FLU A&B, COVID) ARPGX2
Influenza A by PCR: NEGATIVE
Influenza B by PCR: NEGATIVE
SARS Coronavirus 2 by RT PCR: NEGATIVE

## 2020-10-15 MED ORDER — TACROLIMUS 0.5 MG PO CAPS: 0.5000 mg | ORAL_CAPSULE | ORAL | Status: DC

## 2020-10-15 MED ORDER — GABAPENTIN 100 MG PO CAPS
200.0000 mg | ORAL_CAPSULE | Freq: Every day | ORAL | Status: DC
  Administered 2020-10-15: 200 mg via ORAL
  Filled 2020-10-15: qty 2

## 2020-10-15 MED ORDER — TACROLIMUS 1 MG PO CAPS
1.0000 mg | ORAL_CAPSULE | Freq: Every evening | ORAL | Status: DC
  Administered 2020-10-15: 1 mg via ORAL
  Filled 2020-10-15: qty 1

## 2020-10-15 MED ORDER — TACROLIMUS 0.5 MG PO CAPS: 0.5000 mg | ORAL_CAPSULE | Freq: Every day | ORAL | Status: DC

## 2020-10-15 NOTE — Social Work (Signed)
CSW consulted for transportation needs,but family has since decided that [facility] would be the preferred transport method.

## 2020-10-15 NOTE — ED Provider Notes (Signed)
Emergency Medicine Provider Triage Evaluation Note  Jenna [name] , a 65 y.o. female  was evaluated in triage.  Pt complains of increased lower leg swelling. She is a kidney transplant patient. She endorses she has gone up on her diuretic (unsure which one) to 3 times her normal dose without relief of her lower leg swelling.  Review of Systems  Positive: Fatigue, leg swelling Negative: Chest pain, shortness of breath  Physical Exam  BP 140/70 (BP Location: Right Arm)   Pulse (!) 54   Temp 98.2 F (36.8 C) (Oral)   Resp 15   SpO2 100%  Gen:   Awake, no distress, speaks slowly with some confusion, unsure what her baseline is like Resp:  Normal effort  MSK:   Moves extremities without difficulty Other:  2-3+ edema in BIL LE  Medical Decision Making  Medically screening exam initiated at 12:19 PM.  Appropriate orders placed.  Jenna [name] was informed that the remainder of the evaluation will be completed by another provider, this initial triage assessment does not replace that evaluation, and the importance of remaining in the ED until their evaluation is complete.  Leg swelling, fatigue   Jenna [name] 10/15/20 1221    Jenna Bo, MD 10/16/20 [phone]

## 2020-10-15 NOTE — ED Notes (Signed)
Patient transported to X-ray 

## 2020-10-15 NOTE — ED Notes (Signed)
[facility] called, 8 ahead of her

## 2020-10-15 NOTE — ED Provider Notes (Signed)
[facility] EMERGENCY DEPARTMENT Provider Note   CSN: [id number] Arrival date & time: 10/15/20  1206     History Chief Complaint  Patient presents with   Leg Swelling   Shortness of Breath    Jenna [name] is a 65 y.o. female.  The history is provided by the patient.  Shortness of Breath Severity:  Mild Onset quality:  Gradual Duration:  3 weeks Timing:  Constant Progression:  Worsening Chronicity:  Recurrent Context comment:  Patient states that she has had shortness of breath, increasing leg swelling despite going up on her diuretic.  History of kidney transplant, heart failure.  Denies any chest pain. Relieved by:  Nothing Worsened by:  Exertion Associated symptoms: no abdominal pain, no chest pain, no cough, no ear pain, no fever, no rash, no sore throat and no vomiting   Risk factors: no hx of PE/DVT       Past Medical History:  Diagnosis Date   Anemia    Anxiety    panic attack- talks herself and takes deep breathes   CAD (coronary artery disease)    a. STEMI 10/2018 s/p DES to LAD.   Depression    Dyspnea    with exertion    ESRD (end stage renal disease) ([place])    hemo TTHSAT   Essential hypertension    FSGS (focal segmental glomerulosclerosis) 2011   By renal biopsy   Head injury    age 16   HFrEF (heart failure with reduced ejection fraction) ([place])    History of kidney stones    kidney stone   Hx of lupus nephritis 2011   by renal biopsy   Ischemic cardiomyopathy    Kidney transplant recipient    LA thrombus 10/2018   Lupus (systemic lupus erythematosus) ([place])    followed by Dr. 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 remote parietal infarct on brain MRI 12/2019.   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   Elevated troponin 07/11/2020   Anemia of chronic disease 07/11/2020   Acute kidney injury superimposed on chronic kidney disease ([place]) 07/11/2020   Allergic rhinitis 01/01/2020   Anxiety disorder 06/26/2019   HFrEF (heart failure with reduced ejection fraction) ([place]) Mar 07, 2019   CAD (coronary artery disease) March 07, 2019   Paroxysmal atrial fibrillation ([place]) 03/07/19   Deceased-donor kidney transplant 03/27/2018   Dyslipidemia 11/06/2017   CKD (chronic kidney disease) stage 5, GFR less than 15 ml/min ([name]) 02/15/2017   Back pain at L4-L5 level 10/31/2014   Muscle spasm of back 10/31/2014   Chronic maxillary sinusitis 07/04/2014   Occipital headache 07/04/2014   Increased urinary frequency 05/23/2014   Falls 05/23/2014   Rash and nonspecific skin eruption 05/23/2014   Hypertension with renal disease 04/16/2014   Encounter for immunization 11/19/2013   Lupus (systemic lupus erythematosus) ([place]) 09/24/2013   TIA (transient ischemic attack) 05/05/2013   Numbness and tingling of  left side of face 05/04/2013   Lupus ([place]) 04/15/2011   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    Past Surgical History:  Procedure Laterality Date   AV FISTULA PLACEMENT Left 07/04/2017   Procedure: ARTERIOVENOUS (AV) FISTULA CREATION BRACHIOCEPHALIC;  Surgeon: [name], MD;  Location: [facility];  Service: Vascular;  Laterality: Left;   COLONOSCOPY W/ POLYPECTOMY     IR FLUORO GUIDE CV LINE RIGHT  06/28/2017   IR US GUIDE VASC ACCESS RIGHT  06/28/2017   KIDNEY SURGERY     kidney transplant 2020   REVISION OF ARTERIOVENOUS GORETEX GRAFT Left 11/30/2017    Procedure: REVISION OF ARTERIOVENOUS FISTULA LEFT ARM Superfistulization and branch ligation.;  Surgeon: Waynetta Sandy, MD;  Location: [facility];  Service: Vascular;  Laterality: Left;     OB History     Gravida  2   Para  1   Term      Preterm      AB  1   Living  1      SAB  1   IAB      Ectopic      Multiple      Live Births              Family History  Problem Relation Age of Onset   Diabetes Mother    Hypertension Father    Cancer Sister    Hypertension Brother     Social History   Tobacco Use   Smoking status: Never   Smokeless tobacco: Never  Vaping Use   Vaping Use: Never used  Substance Use Topics   Alcohol use: No   Drug use: No    Home Medications Prior to Admission medications   Medication Sig Start Date End Date Taking? Authorizing Provider  acetaminophen (TYLENOL) 500 MG tablet Take 1,000 mg by mouth every 6 (six) hours as needed for headache.    [provider]  aspirin 81 MG EC tablet Take 81 mg by mouth every morning. 11/21/19   [provider]  gabapentin (NEURONTIN) 100 MG capsule Take 2 capsules (200 mg total) by mouth daily. 10/13/20   [name], Betty G, MD  hydroxychloroquine (PLAQUENIL) 200 MG tablet Take 1 tablet (200 mg total) by mouth 2 (two) times daily. 12/25/13   Tresa Garter, MD  isosorbide-hydrALAZINE (BIDIL) 20-37.5 MG tablet Take 1 tablet by mouth 3 (three) times daily. 08/26/20   Debbe Odea, MD  lidocaine (LIDODERM) 5 % Place 1 patch onto the skin daily. Remove & Discard patch within 12 hours or as directed by MD    [provider]  metoprolol succinate (TOPROL-XL) 50 MG 24 hr tablet Take 1 tablet (50 mg total) by mouth daily. Take with or immediately following a meal. 08/27/20   Debbe Odea, MD  nitroGLYCERIN (NITROSTAT) 0.4 MG SL tablet Place 0.4 mg under the tongue every 5 (five) minutes as needed for chest pain.     [provider]  pantoprazole (PROTONIX) 40 MG  tablet Take 40 mg by mouth daily. 08/20/20   [provider]  predniSONE (DELTASONE) 5 MG tablet Take 5 mg by mouth daily with lunch. 05/16/19   [provider]  rosuvastatin (CRESTOR) 5 MG tablet Take 5 mg by mouth daily at 6 PM. 05/02/19   [provider]  simethicone (MYLICON) 80 MG chewable tablet Chew 80 mg by mouth 4 (four) times daily as needed for flatulence.  [provider]  tacrolimus (PROGRAF) 0.5 MG capsule Take 0.5-1 mg by mouth See admin instructions. Take 1 capsule (0.5 mg) by mouth every morning and 2 capsules (1 mg) at night    [provider]  torsemide (DEMADEX) 20 MG tablet Take 20 mg by mouth in the morning, at noon, and at bedtime.    [provider]  traZODone (DESYREL) 50 MG tablet Take 0.5-1 tablets (25-50 mg total) by mouth at bedtime as needed for sleep. 09/01/20   [name], Betty G, MD  valGANciclovir (VALCYTE) 450 MG tablet Take 450 mg by mouth every other day.    [provider]    Allergies    Enalapril maleate, Penicillins, Chocolate, and Tape  Review of Systems   Review of Systems  Constitutional:  Negative for chills and fever.  HENT:  Negative for ear pain and sore throat.   Eyes:  Negative for pain and visual disturbance.  Respiratory:  Positive for shortness of breath. Negative for cough.   Cardiovascular:  Positive for leg swelling. Negative for chest pain and palpitations.  Gastrointestinal:  Negative for abdominal pain and vomiting.  Genitourinary:  Negative for dysuria and hematuria.  Musculoskeletal:  Negative for arthralgias and back pain.  Skin:  Negative for color change and rash.  Neurological:  Negative for seizures and syncope.  All other systems reviewed and are negative.  Physical Exam Updated Vital Signs BP 133/84   Pulse 65   Temp 98.2 F (36.8 C) (Oral)   Resp (!) 21   SpO2 94%   Physical Exam Vitals and nursing note reviewed.  Constitutional:      General: She is not  in acute distress.    Appearance: She is well-developed. She is not ill-appearing.  HENT:     Head: Normocephalic and atraumatic.  Eyes:     Conjunctiva/sclera: Conjunctivae normal.  Cardiovascular:     Rate and Rhythm: Normal rate and regular rhythm.     Heart sounds: No murmur heard. Pulmonary:     Effort: Pulmonary effort is normal. Tachypnea present. No respiratory distress.     Breath sounds: Decreased breath sounds and rales present.  Abdominal:     Palpations: Abdomen is soft.     Tenderness: There is no abdominal tenderness.  Musculoskeletal:     Cervical back: Normal range of motion and neck supple.     Right lower leg: Edema present.     Left lower leg: Edema present.  Skin:    General: Skin is warm and dry.     Capillary Refill: Capillary refill takes less than 2 seconds.  Neurological:     General: No focal deficit present.     Mental Status: She is alert.  Psychiatric:        Mood and Affect: Mood normal.    ED Results / Procedures / Treatments   Labs (all labs ordered are listed, but only abnormal results are displayed) Labs Reviewed  CBC - Abnormal; Notable for the following components:      Result Value   WBC 2.9 (*)    RBC 3.63 (*)    Hemoglobin 10.3 (*)    HCT 33.4 (*)    RDW 22.5 (*)    Platelets 122 (*)    All other components within normal limits  COMPREHENSIVE METABOLIC PANEL - Abnormal; Notable for the following components:   Sodium 132 (*)    Chloride 96 (*)    BUN 56 (*)    Creatinine, Ser 3.33 (*)  Albumin 3.2 (*)    GFR, Estimated 15 (*)    All other components within normal limits  RESP PANEL BY RT-PCR (FLU A&B, COVID) ARPGX2  URINALYSIS, ROUTINE W REFLEX MICROSCOPIC  BRAIN NATRIURETIC PEPTIDE  TACROLIMUS LEVEL    EKG EKG Interpretation  Date/Time:  Wednesday October 15 2020 12:20:01 EDT Ventricular Rate:  53 PR Interval:  180 QRS Duration: 136 QT Interval:  510 QTC Calculation: 478 R Axis:   -67 Text Interpretation: Sinus  bradycardia Left axis deviation Non-specific intra-ventricular conduction block Minimal voltage criteria for LVH, may be normal variant ( Cornell product ) Confirmed by Lennice Sites ([id number]) on 10/15/2020 12:42:15 PM  Radiology DG Chest 2 View  Result Date: 10/15/2020 CLINICAL DATA:  CHF. Shortness of breath and bilateral leg swelling. EXAM: CHEST - 2 VIEW COMPARISON:  08/21/2020 FINDINGS: Cardiac silhouette remains enlarged with unchanged pulmonary vascular congestion. Curvilinear densities in the mid and lower lungs bilaterally are compatible with atelectasis, mildly improved from prior. There are small pleural effusions, slightly decreased from prior. No pneumothorax is identified. No acute osseous abnormality is seen. IMPRESSION: Cardiomegaly with unchanged pulmonary vascular congestion and persistent small pleural effusions. Mildly improved bibasilar atelectasis. Electronically Signed   By: Logan Bores M.D.   On: 10/15/2020 13:17    Procedures Procedures   Medications Ordered in ED Medications - No data to display  ED Course  I have reviewed the triage vital signs and the nursing notes.  Pertinent labs & imaging results that were available during my care of the patient were reviewed by me and considered in my medical decision making (see chart for details).    MDM Rules/Calculators/A&P                           Mikena Masoner Gafford is a 65 year old female with history of heart failure, renal transplant on immunosuppression who presents to the ED with shortness of breath, leg swelling.  Patient concern for fluid retention.  She states that they have increased her home torsemide but she has not felt improvement.  She has overall normal vitals.  No fever.  She denies any infectious symptoms.  She does not have a dry weight that she is supposed to be at.  She does appear volume overloaded on exam.  Chest x-ray shows cardiomegaly with small pleural effusions and pulmonary vascular congestion.   Creatinine appears to be at baseline.  We will get BNP and troponin.  Overall suspect volume overload.  Have low suspicion for PE or ACS.  Anticipate need for admission for diuresis.  Handed off to oncoming ED staff patient pending remaining work-up.  This chart was dictated using voice recognition software.  Despite best efforts to proofread,  errors can occur which can change the documentation meaning.   Final Clinical Impression(s) / ED Diagnoses Final diagnoses:  Leg swelling  Shortness of breath    Rx / DC Orders ED Discharge Orders     None        Lennice Sites, DO 10/15/20 1547

## 2020-10-15 NOTE — ED Triage Notes (Signed)
Pt arrives via EMS for lower leg swelling over the past 2 weeks. Pt states MD has increased her diuretic, but it has not helped. Pt has hx of CHF, Kidney Transplant. Denies CP, SOB. 98% on room air, BP 126/76, HR 52  Pt's Dtr [place] [phone].

## 2020-10-15 NOTE — ED Provider Notes (Signed)
Pt signed out by Dr. Ronnald Nian pending labs.  She was a difficult stick, so labs were delayed.  Labs with elevated trop (chronic for pt) and elevated bnp (also chronic).  Pt is not sob and wants to go home.  She has follow up with her new cardiologist at [facility] soon.  She is to return if worse.  F/u with cards/pcp.    Isla Pence, MD 10/15/20 [phone]

## 2020-10-16 ENCOUNTER — Ambulatory Visit (INDEPENDENT_AMBULATORY_CARE_PROVIDER_SITE_OTHER): Payer: Medicare Other | Admitting: *Deleted

## 2020-10-16 DIAGNOSIS — G459 Transient cerebral ischemic attack, unspecified: Secondary | ICD-10-CM

## 2020-10-16 DIAGNOSIS — B259 Cytomegaloviral disease, unspecified: Secondary | ICD-10-CM

## 2020-10-16 DIAGNOSIS — I129 Hypertensive chronic kidney disease with stage 1 through stage 4 chronic kidney disease, or unspecified chronic kidney disease: Secondary | ICD-10-CM

## 2020-10-16 DIAGNOSIS — W19XXXA Unspecified fall, initial encounter: Secondary | ICD-10-CM

## 2020-10-16 DIAGNOSIS — N185 Chronic kidney disease, stage 5: Secondary | ICD-10-CM

## 2020-10-16 DIAGNOSIS — F419 Anxiety disorder, unspecified: Secondary | ICD-10-CM

## 2020-10-16 DIAGNOSIS — J9601 Acute respiratory failure with hypoxia: Secondary | ICD-10-CM

## 2020-10-16 DIAGNOSIS — I5020 Unspecified systolic (congestive) heart failure: Secondary | ICD-10-CM

## 2020-10-16 DIAGNOSIS — I2510 Atherosclerotic heart disease of native coronary artery without angina pectoris: Secondary | ICD-10-CM

## 2020-10-16 DIAGNOSIS — M329 Systemic lupus erythematosus, unspecified: Secondary | ICD-10-CM

## 2020-10-17 LAB — TACROLIMUS LEVEL: Tacrolimus (FK506) - LabCorp: 10.5 ng/mL (ref 2.0–20.0)

## 2020-10-17 NOTE — Chronic Care Management (AMB) (Signed)
Chronic Care Management    Clinical Social Work Note  10/17/2020 Name: Jenna [name] MRN: [id number] DOB: [birth year]  Jenna [name] is a 65 y.o. year old female who is a primary care patient of [name], [name], MD. The CCM team was consulted to assist the patient with chronic disease management and/or care coordination needs related to: Mental Health Counseling and Resources.   Engaged with patient by telephone for initial visit in response to provider referral for social work chronic care management and care coordination services.   Consent to Services:  The patient was given information about Chronic Care Management services, agreed to services, and gave verbal consent prior to initiation of services.  Please see initial visit note for detailed documentation.   Patient agreed to services and consent obtained.   Assessment: Review of patient past medical history, allergies, medications, and health status, including review of relevant consultants reports was performed today as part of a comprehensive evaluation and provision of chronic care management and care coordination services.     SDOH (Social Determinants of Health) assessments and interventions performed:  SDOH Interventions    Flowsheet Row Most Recent Value  SDOH Interventions   Food Insecurity Interventions Intervention Not Indicated  Financial Strain Interventions Intervention Not Indicated  Housing Interventions Intervention Not Indicated  Intimate Partner Violence Interventions Intervention Not Indicated  Physical Activity Interventions Intervention Not Indicated  Stress Interventions Offered [employer] Resources, Provide Counseling, Other (Comment)  [Referral to [facility]  Social Connections Interventions Intervention Not Indicated  Transportation Interventions Intervention Not Indicated        Advanced Directives Status: See Care Plan for related entries.  CCM Care Plan  Allergies  Allergen  Reactions   Enalapril Maleate Anaphylaxis, Swelling and Other (See Comments)    Throat swells   Penicillins Other (See Comments)    Made patient lightheaded PATIENT HAS HAD A PCN REACTION WITH IMMEDIATE RASH, FACIAL/TONGUE/THROAT SWELLING, SOB, OR LIGHTHEADEDNESS WITH HYPOTENSION:  #  #  YES  #  #  Has patient had a PCN reaction causing severe rash involving mucus membranes or skin necrosis: No Has patient had a PCN reaction that required hospitalization: No Has patient had a PCN reaction occurring within the last 10 years: No If all of the above answers are "NO", then may proceed with Cephalosporin use.    Chocolate Nausea And Vomiting   Tape Itching, Rash and Other (See Comments)    Outpatient Encounter Medications as of 10/16/2020  Medication Sig Note   acetaminophen (TYLENOL) 500 MG tablet Take 1,000 mg by mouth every 6 (six) hours as needed for headache.    aspirin 81 MG EC tablet Take 81 mg by mouth every morning.    gabapentin (NEURONTIN) 100 MG capsule Take 2 capsules (200 mg total) by mouth daily.    hydroxychloroquine (PLAQUENIL) 200 MG tablet Take 1 tablet (200 mg total) by mouth 2 (two) times daily.    isosorbide-hydrALAZINE (BIDIL) 20-37.5 MG tablet Take 1 tablet by mouth 3 (three) times daily.    lidocaine (LIDODERM) 5 % Place 1 patch onto the skin daily. Remove & Discard patch within 12 hours or as directed by MD    metoprolol succinate (TOPROL-XL) 50 MG 24 hr tablet Take 1 tablet (50 mg total) by mouth daily. Take with or immediately following a meal.    nitroGLYCERIN (NITROSTAT) 0.4 MG SL tablet Place 0.4 mg under the tongue every 5 (five) minutes as needed for chest pain.  pantoprazole (PROTONIX) 40 MG tablet Take 40 mg by mouth daily.    predniSONE (DELTASONE) 5 MG tablet Take 5 mg by mouth daily with lunch. 06/22/2019: Continuous course   rosuvastatin (CRESTOR) 5 MG tablet Take 5 mg by mouth daily at 6 PM.    simethicone (MYLICON) 80 MG chewable tablet Chew 80 mg by  mouth 4 (four) times daily as needed for flatulence.    tacrolimus (PROGRAF) 0.5 MG capsule Take 0.5-1 mg by mouth See admin instructions. Take 1 capsule (0.5 mg) by mouth every morning and 2 capsules (1 mg) at night    torsemide (DEMADEX) 20 MG tablet Take 20 mg by mouth in the morning, at noon, and at bedtime.    traZODone (DESYREL) 50 MG tablet Take 0.5-1 tablets (25-50 mg total) by mouth at bedtime as needed for sleep.    valGANciclovir (VALCYTE) 450 MG tablet Take 450 mg by mouth every other day.    No facility-administered encounter medications on file as of 10/16/2020.    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   Elevated troponin 07/11/2020   Anemia of chronic disease 07/11/2020   Acute kidney injury superimposed on chronic kidney disease ([place]) 07/11/2020   Allergic rhinitis 01/01/2020   Anxiety disorder 06/26/2019   HFrEF (heart failure with reduced ejection fraction) ([place]) March 09, 2019   CAD (coronary artery disease) 03/09/19   Paroxysmal atrial fibrillation ([place]) 2019-03-09   Deceased-donor kidney transplant 03/27/2018   Dyslipidemia 11/06/2017   CKD (chronic kidney disease) stage 5, GFR less than 15 ml/min ([name]) 02/15/2017   Back pain at L4-L5 level 10/31/2014   Muscle spasm of back 10/31/2014   Chronic maxillary sinusitis 07/04/2014   Occipital headache 07/04/2014   Increased urinary frequency 05/23/2014   Falls 05/23/2014   Rash and nonspecific skin eruption 05/23/2014   Hypertension with renal disease 04/16/2014   Encounter for immunization 11/19/2013   Lupus (systemic lupus erythematosus) ([place]) 09/24/2013   TIA (transient ischemic attack) 05/05/2013   Numbness and tingling of left side of face 05/04/2013   Lupus ([place]) 04/15/2011   FSGS  (focal segmental glomerulosclerosis) 02/15/2009   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  Mental Health Concerns and Lacks Knowledge of [employer].  Care Plan : LCSW Plan of Care  Updates made by Francis Gaines, LCSW since 10/17/2020 12:00 AM     Problem: Reduce and Manage My Symptoms of Anxiety and Depression.   Priority: High     Goal: Reduce and Manage My Symptoms of Anxiety and Depression.   Start Date: 10/16/2020  Expected End Date: 12/16/2020  This Visit's Progress: On track  Priority: High  Note:   Current Barriers:   Acute Mental Health needs related to Anxiety, Depression and Chronic Kidney Disease, Stage 5, requires Support, Education, Resources, Referrals and Care Coordination in order to meet unmet mental health needs. Clinical Goal(s):  Patient will work with LCSW to reduce and manage symptoms of Anxiety and Depression, until established with a community provider.   Patient will increase knowledge and/or ability of:        Coping Skills, Healthy Habits, Self-Management Skills, Stress Reduction, Home Safety and Utilizing [employer] and Resources.   Clinical Interventions:  Assessed patient's previous treatment, needs, coping skills,  current treatment, support system and barriers to care. PHQ-2 and PHQ-9 Depression Screening Tool performed and results reviewed with patient. Other interventions included:       Solution-Focused Therapy Performed, Mindfulness Meditation Strategies, Relaxation Techniques and Deep Breathing Exercises Encouraged, Active Listening/       Reflection Utilized, Emotional Support Provided, Problem Solving [place], Psychoeducation /Health Education, Motivational        Interviewing, Brief Cognitive Behavioral Therapy Initiated, Reviewed Mental Health Medications and Discussed Compliance, Quality of Sleep Assessed and       Sleep Hygiene Techniques  Promoted, Support Group Participation Encouraged, Increase Level of Activity/Exercise, Verbalization of Feelings Encouraged,        Crisis Resource Education/Information Provided, Suicidal Ideation/Homicidal Ideation Assessed - None Present.   Provided mental health counseling with regards to Anxiety and Depression.      Discussed plans with patient for ongoing care management follow-up and provided patient with direct contact information for care management team. Discussed several options for long-term counseling based on need and insurance, and verbal consent obtained to place a referral to [facility] for ongoing mental health counseling and supportive services.  Collaboration with Primary Care Physician, Dr. Betty [name] regarding development and update of comprehensive plan of care as evidenced by provider attestation and co-signature. Inter-disciplinary care team collaboration (see longitudinal plan of care). Patient Goals/Self-Care Activities: Begin personal counseling with LCSW on a weekly/bi-weekly basis, to reduce and manage symptoms of Anxiety and Depression, until established with [facility]. Accept all calls from representative with [facility] in an effort to establish ongoing mental health counseling and supportive services. Incorporate into daily practice - relaxation techniques, deep breathing exercises and mindfulness meditation strategies. Talk about feelings with a friend, family member, or spiritual advisor. Continue with compliance of taking prescription medications, try to obtain adequate rest, stay well-hydrated and eat a healthy, well-balanced diet. Continue to receive home health nursing services through [facility]. Keep appointment with representative from the [place] to complete your home assessment for CAP (Community Alternative Program) Services. Follow-Up:  10/28/2020 at [place]:   10/28/2020 at [facility] [place] Social Worker [place] [phone]

## 2020-10-17 NOTE — Patient Instructions (Signed)
Visit Information   PATIENT GOALS:   Goals Addressed               This Visit's Progress     Reduce and Manage My Symptoms of Anxiety and Depression. (pt-stated)   On track     Timeframe:  Short-Term Goal Priority:  High Start Date:  10/16/2020                         Expected End Date:  12/16/2020                 Follow-Up Date: 10/28/2020 at 9:00am  Patient Goals/Self-Care Activities: Begin personal counseling with LCSW on a weekly/bi-weekly basis, to reduce and manage symptoms of Anxiety and Depression, until established with [place]. Accept all calls from representative with [place] in an effort to establish ongoing mental health counseling and supportive services. Incorporate into daily practice - relaxation techniques, deep breathing exercises and mindfulness meditation strategies. Talk about feelings with a friend, family member, or spiritual advisor. Continue with compliance of taking prescription medications, try to obtain adequate rest, stay well-hydrated and eat a healthy, well-balanced diet. Continue to receive home health nursing services through [facility]. Keep appointment with representative from the [facility] to complete your home assessment for CAP (Community Alternative Program) Services.          Consent to CCM Services: Ms. Nierenberg was given information about Chronic Care Management services including:  CCM service includes personalized support from designated clinical staff supervised by her physician, including individualized plan of care and coordination with other care providers 24/7 contact phone numbers for assistance for urgent and routine care needs. Service will only be billed when office clinical staff spend 20 minutes or more in a month to coordinate care. Only one practitioner may furnish and bill the service in a calendar month. The patient may stop CCM services at any time  (effective at the end of the month) by phone call to the office staff. The patient will be responsible for cost sharing (co-pay) of up to 20% of the service fee (after annual deductible is met).  Patient agreed to services and verbal consent obtained.   Patient verbalizes understanding of instructions provided today and agrees to view in [place].   Telephone follow-up appointment with care management team member scheduled for:  10/28/2020 at [place] LCSW Licensed Clinical Social Worker [facility] [phone]   CLINICAL CARE PLAN: Patient Care Plan: RNCM:Heart Failure (Adult)     Problem Identified: Lack of long-term self-management of Heart Failure   Priority: High     Long-Range Goal: Effective self-management of Heart Failure   Start Date: 09/05/2020  Expected End Date: 01/15/2021  This Visit's Progress: On track  Recent Progress: On track  Priority: High  Note:   Current Barriers:  Knowledge deficits related to basic heart failure pathophysiology and self care management with hx of CKD,  Lupus, kidney transplant 2020, HTN, paroxysmal Atrial Fibrillation  Unable to independently self manage heart failure Lacks social connections Unable to perform ADLs independently Financial strain  States she has needed more help since she has been in the hospital so frequently.  States that she is still getting home health therapy and the social worker has been helping her apply of more assistance with helping her take care of herself .  Denies any shortness of breath or increased swelling today. States she  has been weighting everyday.   States she is [facility] providers at [facility]  for her kidneys, heart, psychiatry and nutrition. States that she wants to get a new psychiatrist  Nurse Case Manager Clinical Goal(s):  patient will weigh self daily and record patient will verbalize understanding of Heart Failure Action Plan and when to call doctor patient will take all  Heart Failure mediations as prescribed Interventions:  Collaboration with [name], Betty G, MD regarding development and update of comprehensive plan of care as evidenced by provider attestation and co-signature Inter-disciplinary care team collaboration (see longitudinal plan of care) Basic overview and discussion of pathophysiology of Heart Failure Reviewed to follow low sodium diet Reinforced Heart Failure Action Plan  Assessed for scales in home-using scale Reinforced importance of daily weight Reinforced role of diuretics in prevention of fluid overload Referred to CCM pharmacist to assist with cost of medications-pending  Referral to CCM LCSW to assist pt to find new mental health provider/psychiatrist Self-Care Activities:  Takes Heart Failure Medications as prescribed Weighs daily and record (notifying MD of 3 lb weight gain over night or 5 lb in a week) Verbalizes understanding of and follows CHF Action Plan Adheres to low sodium diet  Patient Goals:  - Take Heart Failure Medications as prescribed - Weigh daily and record (notify MD with 3 lb weight gain over night or 5 lb in a week) - Follow CHF Action Plan - Adhere to low sodium diet - develop a rescue plan - eat more whole grains, fruits and vegetables, lean meats and healthy fats - follow rescue plan if symptoms flare-up - know when to call the doctor - track symptoms and what helps feel better or worse - dress right for the weather, hot or cold - pace activity allowing for rest - do ankle pumps when sitting - keep legs up while sitting - meet with dietitian - track weight in diary - use salt in moderation - watch for swelling in feet, ankles and legs every day - weigh myself daily Follow Up Plan: Telephone follow up appointment with care management team member scheduled for: 11/13/20 at 3:30 PM The patient has been provided with contact information for the care management team and has been advised to call with any  health related questions or concerns.      Patient Care Plan: RNCM:Chronic Kidney (Adult)     Problem Identified: Disease Progression   Priority: Medium     Long-Range Goal: Disease Progression Prevented or Minimized   Start Date: 09/05/2020  Expected End Date: 01/15/2021  This Visit's Progress: On track  Recent Progress: On track  Priority: Medium  Note:   Current Barriers:  Ineffective Self Health Maintenance in a patient with CKD Stage 4 with hx of transplant Unable to independently self manage CKD stage 4 Unable to perform ADLs independently States she has to watch the amount of fluids she drinks and the salt in her diet.  Pt states she is tried a lot.  States that the iron injection did help her feel a little better.  States she does not have transportation to her injection appointment tomorrow Clinical Goal(s):  Collaboration with [name], Betty G, MD regarding development and update of comprehensive plan of care as evidenced by provider attestation and co-signature Inter-disciplinary care team collaboration (see longitudinal plan of care) patient will work with care management team to address care coordination and chronic disease management needs related to Disease Management Educational Needs Care Coordination   Interventions:  Evaluation  of current treatment plan related to CHF, HTN, CKD Stage 4, and lupus , ADL IADL limitations and Social Isolation self-management and patient's adherence to plan as established by provider. Collaboration with [name], Betty G, MD regarding development and update of comprehensive plan of care as evidenced by provider attestation       and co-signature Inter-disciplinary care team collaboration (see longitudinal plan of care) Discussed plans with patient for ongoing care management follow up and provided patient with direct contact information for care management team Reinforced importance of following fluid restrictions and renal diet  Referral  made to care guide for urgent request for transportation to infusion clinic 10/01/20 Self Care Activities:  Patient verbalizes understanding of plan to self manage CKD with hx of transplant Self administers medications as prescribed Attends all scheduled provider appointments Calls pharmacy for medication refills Performs ADL's independently Calls provider office for new concerns or questions Patient Goals: - choose foods low in fat and sugar - choose foods that are low in sodium (salt) - eat 2 servings of fruit/vegetables every day - keep healthy snacks on hand - meet with dietitian - read food labels for sodium (salt), fat and sugar content - track weight in diary - watch for swelling in feet, ankles and legs every day - weigh myself daily Follow Up Plan: Telephone follow up appointment with care management team member scheduled for: 11/13/20 at 3:30 PM The patient has been provided with contact information for the care management team and has been advised to call with any health related questions or concerns.      Patient Care Plan: LCSW Plan of Care     Problem Identified: Reduce and Manage My Symptoms of Anxiety and Depression.   Priority: High     Goal: Reduce and Manage My Symptoms of Anxiety and Depression.   Start Date: 10/16/2020  Expected End Date: 12/16/2020  This Visit's Progress: On track  Priority: High  Note:   Current Barriers:   Acute Mental Health needs related to Anxiety, Depression and Chronic Kidney Disease, Stage 5, requires Support, Education, Resources, Referrals and Care Coordination in order to meet unmet mental health needs. Clinical Goal(s):  Patient will work with LCSW to reduce and manage symptoms of Anxiety and Depression, until established with a community provider.   Patient will increase knowledge and/or ability of:        Coping Skills, Healthy Habits, Self-Management Skills, Stress Reduction, Home Safety and Utilizing [employer] and  Resources.   Clinical Interventions:  Assessed patient's previous treatment, needs, coping skills, current treatment, support system and barriers to care. PHQ-2 and PHQ-9 Depression Screening Tool performed and results reviewed with patient. Other interventions included:       Solution-Focused Therapy Performed, Mindfulness Meditation Strategies, Relaxation Techniques and Deep Breathing Exercises Encouraged, Active Listening/       Reflection Utilized, Emotional Support Provided, Problem Solving [place], Psychoeducation /Health Education, Motivational        Interviewing, Brief Cognitive Behavioral Therapy Initiated, Reviewed Mental Health Medications and Discussed Compliance, Quality of Sleep Assessed and       Sleep Hygiene Techniques Promoted, Support Group Participation Encouraged, Increase Level of Activity/Exercise, Verbalization of Feelings Encouraged,        Crisis Resource Education/Information Provided, Suicidal Ideation/Homicidal Ideation Assessed - None Present.   Provided mental health counseling with regards to Anxiety and Depression.      Discussed plans with patient for ongoing care management follow-up and provided patient with direct contact  information for care management team. Discussed several options for long-term counseling based on need and insurance, and verbal consent obtained to place a referral to [facility] for ongoing mental health counseling and supportive services.  Collaboration with Primary Care Physician, Dr. Betty [name] regarding development and update of comprehensive plan of care as evidenced by provider attestation and co-signature. Inter-disciplinary care team collaboration (see longitudinal plan of care). Patient Goals/Self-Care Activities: Begin personal counseling with LCSW on a weekly/bi-weekly basis, to reduce and manage symptoms of Anxiety and Depression, until established with [place]. Accept all calls from  representative with [place] in an effort to establish ongoing mental health counseling and supportive services. Incorporate into daily practice - relaxation techniques, deep breathing exercises and mindfulness meditation strategies. Talk about feelings with a friend, family member, or spiritual advisor. Continue with compliance of taking prescription medications, try to obtain adequate rest, stay well-hydrated and eat a healthy, well-balanced diet. Continue to receive home health nursing services through [facility]. Keep appointment with representative from the [facility] to complete your home assessment for CAP (Community Alternative Program) Services. Follow-Up:  10/28/2020 at 9:00am

## 2020-10-24 ENCOUNTER — Telehealth: Payer: Self-pay

## 2020-10-24 NOTE — Telephone Encounter (Signed)
Patient called asking if she could be prescribed Rx to her her sleep.

## 2020-10-24 NOTE — Telephone Encounter (Signed)
You have pt on Trazadone 50 mg ( take 1/2-1 tablet at bedtime)

## 2020-10-27 NOTE — Telephone Encounter (Signed)
Because her chronic medical problems and risk of interaction, we do not have many options. She is on Trazodone 50 mg at bedtime, she can try 100 mg 30 min before bedtime. She is supposed to establish with psychiatrist for anxiety, this problem can also aggravate insomnia. Thanks, BJ

## 2020-10-27 NOTE — Telephone Encounter (Signed)
Patient has made an appointment for 9/14 to discuss.

## 2020-10-28 ENCOUNTER — Ambulatory Visit: Payer: Medicare Other | Admitting: *Deleted

## 2020-10-28 ENCOUNTER — Other Ambulatory Visit: Payer: Self-pay

## 2020-10-28 DIAGNOSIS — I5020 Unspecified systolic (congestive) heart failure: Secondary | ICD-10-CM

## 2020-10-28 DIAGNOSIS — G6289 Other specified polyneuropathies: Secondary | ICD-10-CM

## 2020-10-28 DIAGNOSIS — W19XXXA Unspecified fall, initial encounter: Secondary | ICD-10-CM

## 2020-10-28 DIAGNOSIS — N185 Chronic kidney disease, stage 5: Secondary | ICD-10-CM

## 2020-10-28 DIAGNOSIS — I129 Hypertensive chronic kidney disease with stage 1 through stage 4 chronic kidney disease, or unspecified chronic kidney disease: Secondary | ICD-10-CM

## 2020-10-28 DIAGNOSIS — F419 Anxiety disorder, unspecified: Secondary | ICD-10-CM

## 2020-10-28 DIAGNOSIS — G459 Transient cerebral ischemic attack, unspecified: Secondary | ICD-10-CM

## 2020-10-28 DIAGNOSIS — M3214 Glomerular disease in systemic lupus erythematosus: Secondary | ICD-10-CM

## 2020-10-28 NOTE — Chronic Care Management (AMB) (Signed)
Chronic Care Management    Clinical Social Work Note  10/28/2020 Name: Jenna [name] MRN: [id number] DOB: 03-30-1955  Jenna [name] is a 65 y.o. year old female who is a primary care patient of [name], [name], MD. The CCM team was consulted to assist the patient with chronic disease management and/or care coordination needs related to: Mental Health Counseling and Resources.   Engaged with patient by telephone for follow-up visit in response to provider referral for social work chronic care management and care coordination services.   Consent to Services:  The patient was given information about Chronic Care Management services, agreed to services, and gave verbal consent prior to initiation of services.  Please see initial visit note for detailed documentation.   Patient agreed to services and consent obtained.   Assessment: Review of patient past medical history, allergies, medications, and health status, including review of relevant consultants reports was performed today as part of a comprehensive evaluation and provision of chronic care management and care coordination services.     SDOH (Social Determinants of Health) assessments and interventions performed:    Advanced Directives Status: Not addressed in this encounter.  CCM Care Plan  Allergies  Allergen Reactions   Enalapril Maleate Anaphylaxis, Swelling and Other (See Comments)    Throat swells   Penicillins Other (See Comments)    Made patient lightheaded PATIENT HAS HAD A PCN REACTION WITH IMMEDIATE RASH, FACIAL/TONGUE/THROAT SWELLING, SOB, OR LIGHTHEADEDNESS WITH HYPOTENSION:  #  #  YES  #  #  Has patient had a PCN reaction causing severe rash involving mucus membranes or skin necrosis: No Has patient had a PCN reaction that required hospitalization: No Has patient had a PCN reaction occurring within the last 10 years: No If all of the above answers are "NO", then may proceed with Cephalosporin use.    Chocolate  Nausea And Vomiting   Tape Itching, Rash and Other (See Comments)    Outpatient Encounter Medications as of 10/28/2020  Medication Sig Note   acetaminophen (TYLENOL) 500 MG tablet Take 1,000 mg by mouth every 6 (six) hours as needed for headache.    aspirin 81 MG EC tablet Take 81 mg by mouth every morning.    gabapentin (NEURONTIN) 100 MG capsule Take 2 capsules (200 mg total) by mouth daily.    hydroxychloroquine (PLAQUENIL) 200 MG tablet Take 1 tablet (200 mg total) by mouth 2 (two) times daily.    isosorbide-hydrALAZINE (BIDIL) 20-37.5 MG tablet Take 1 tablet by mouth 3 (three) times daily.    lidocaine (LIDODERM) 5 % Place 1 patch onto the skin daily. Remove & Discard patch within 12 hours or as directed by MD    metoprolol succinate (TOPROL-XL) 50 MG 24 hr tablet Take 1 tablet (50 mg total) by mouth daily. Take with or immediately following a meal.    nitroGLYCERIN (NITROSTAT) 0.4 MG SL tablet Place 0.4 mg under the tongue every 5 (five) minutes as needed for chest pain.     pantoprazole (PROTONIX) 40 MG tablet Take 40 mg by mouth daily.    predniSONE (DELTASONE) 5 MG tablet Take 5 mg by mouth daily with lunch. 06/22/2019: Continuous course   rosuvastatin (CRESTOR) 5 MG tablet Take 5 mg by mouth daily at 6 PM.    simethicone (MYLICON) 80 MG chewable tablet Chew 80 mg by mouth 4 (four) times daily as needed for flatulence.    tacrolimus (PROGRAF) 0.5 MG capsule Take 0.5-1 mg by mouth See admin instructions.  Take 1 capsule (0.5 mg) by mouth every morning and 2 capsules (1 mg) at night    torsemide (DEMADEX) 20 MG tablet Take 20 mg by mouth in the morning, at noon, and at bedtime.    traZODone (DESYREL) 50 MG tablet Take 0.5-1 tablets (25-50 mg total) by mouth at bedtime as needed for sleep.    valGANciclovir (VALCYTE) 450 MG tablet Take 450 mg by mouth every other day.    No facility-administered encounter medications on file as of 10/28/2020.    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   Elevated troponin 07/11/2020   Anemia of chronic disease 07/11/2020   Acute kidney injury superimposed on chronic kidney disease ([place]) 07/11/2020   Allergic rhinitis 01/01/2020   Anxiety disorder 06/26/2019   HFrEF (heart failure with reduced ejection fraction) ([place]) 2019/03/13   CAD (coronary artery disease) 2019/03/13   Paroxysmal atrial fibrillation ([place]) 03-13-19   Deceased-donor kidney transplant 03/27/2018   Dyslipidemia 11/06/2017   CKD (chronic kidney disease) stage 5, GFR less than 15 ml/min ([name]) 02/15/2017   Back pain at L4-L5 level 10/31/2014   Muscle spasm of back 10/31/2014   Chronic maxillary sinusitis 07/04/2014   Occipital headache 07/04/2014   Increased urinary frequency 05/23/2014   Falls 05/23/2014   Rash and nonspecific skin eruption 05/23/2014   Hypertension with renal disease 04/16/2014   Encounter for immunization 11/19/2013   Lupus (systemic lupus erythematosus) ([place]) 09/24/2013   TIA (transient ischemic attack) 05/05/2013   Numbness and tingling of left side of face 05/04/2013   Lupus ([place]) 04/15/2011   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  Limited Social Support, Mental Health Concerns, Social Isolation, Limited Access to Caregiver, and Lacks Knowledge of [employer].    Care Plan : LCSW Plan of Care  Updates made by Francis Gaines, LCSW since 10/28/2020 12:00 AM     Problem: Reduce and Manage My Symptoms of Anxiety and Depression.   Priority: High     Goal: Reduce and Manage My Symptoms of Anxiety and Depression.   Start Date: 10/16/2020  Expected End Date: 12/16/2020  This Visit's Progress:  On track  Recent Progress: On track  Priority: High  Note:   Current Barriers:   Acute Mental Health needs related to Anxiety, Depression and Chronic Kidney Disease, Stage 5, requires Support, Education, Resources, Referrals and Care Coordination in order to meet unmet mental health needs. Clinical Goal(s):  Patient will work with LCSW to reduce and manage symptoms of Anxiety and Depression, until established with a community provider.   Patient will increase knowledge and/or ability of:        Coping Skills, Healthy Habits, Self-Management Skills, Stress Reduction, Home Safety and Utilizing [employer] and Resources.   Clinical Interventions:  Assessed patient's previous treatment, needs, coping skills, current treatment, support system and barriers to care. PHQ-2 and PHQ-9 Depression Screening Tool performed and results reviewed with patient. Other interventions included:       Solution-Focused Therapy Performed, Mindfulness Meditation Strategies, Relaxation Techniques and Deep Breathing Exercises Encouraged, Active Listening/       Reflection Utilized, Emotional Support Provided, Problem Solving [place], Psychoeducation /Health Education, Motivational  Interviewing, Brief Cognitive Behavioral Therapy Initiated, Reviewed Mental Health Medications and Discussed Compliance, Quality of Sleep Assessed and       Sleep Hygiene Techniques Promoted, Support Group Participation Encouraged, Increase Level of Activity/Exercise, Verbalization of Feelings Encouraged,        Crisis Resource Education/Information Provided, Suicidal Ideation/Homicidal Ideation Assessed - None Present.   Provided mental health counseling with regards to Anxiety and Depression.      Discussed plans with patient for ongoing care management follow-up and provided patient with direct contact information for care management team. Collaboration with Primary Care Physician, Dr. Betty [name]  regarding development and update of comprehensive plan of care as evidenced by provider attestation and co-signature. Inter-disciplinary care team collaboration (see longitudinal plan of care). Patient Goals/Self-Care Activities: Continue to receive personal counseling with LCSW on a weekly/bi-weekly basis, to reduce and manage symptoms of Anxiety and Depression, until well-established with [facility]. Accept all calls from representative with [place] in an effort to establish ongoing mental health counseling and supportive services. Continue to incorporate into daily practice - relaxation techniques, deep breathing exercises and mindfulness meditation strategies. LCSW placed a request to your Primary Care Physician, Dr. Betty [name], as well as your Embedded Nurse Case Manager, Peter Garter to order you a tub bench/shower chair for home use, per your request. Continue to receive home health nursing services through [facility]. Keep appointment with representative from [employer] to complete your home assessment for [place], scheduled for 10/28/2020.  Follow-Up:  11/04/2020 at [place]:  11/04/2020 at [place] Social Worker [place] [phone]

## 2020-10-28 NOTE — Patient Instructions (Signed)
Visit Information  PATIENT GOALS:  Goals Addressed               This Visit's Progress     Reduce and Manage My Symptoms of Anxiety and Depression. (pt-stated)   On track     Timeframe:  Short-Term Goal Priority:  High Start Date:  10/16/2020                         Expected End Date:  12/16/2020                 Follow-Up Date: 11/04/2020 at 9:00am  Patient Goals/Self-Care Activities: Continue to receive personal counseling with LCSW on a weekly/bi-weekly basis, to reduce and manage symptoms of Anxiety and Depression, until well-established with [facility]. Accept all calls from representative with [place] in an effort to establish ongoing mental health counseling and supportive services. Continue to incorporate into daily practice - relaxation techniques, deep breathing exercises and mindfulness meditation strategies. LCSW placed a request to your Primary Care Physician, Dr. Betty [name], as well as your Embedded Nurse Case Manager, Peter Garter to order you a tub bench/shower chair for home use, per your request. Continue to receive home health nursing services through [facility]. Keep appointment with representative from [facility] to complete your home assessment for [place], scheduled for 10/28/2020.            Patient verbalizes understanding of instructions provided today and agrees to view in [place].   Telephone follow-up appointment with care management team member scheduled for:  11/04/2020 at [place] LCSW Licensed Clinical Social Worker [place] [phone]

## 2020-10-29 ENCOUNTER — Ambulatory Visit (INDEPENDENT_AMBULATORY_CARE_PROVIDER_SITE_OTHER): Payer: Medicare Other | Admitting: Family Medicine

## 2020-10-29 ENCOUNTER — Encounter: Payer: Self-pay | Admitting: Family Medicine

## 2020-10-29 VITALS — BP 90/60 | HR 48 | Resp 16 | Ht 67.0 in

## 2020-10-29 DIAGNOSIS — G6289 Other specified polyneuropathies: Secondary | ICD-10-CM

## 2020-10-29 DIAGNOSIS — G4700 Insomnia, unspecified: Secondary | ICD-10-CM

## 2020-10-29 DIAGNOSIS — I129 Hypertensive chronic kidney disease with stage 1 through stage 4 chronic kidney disease, or unspecified chronic kidney disease: Secondary | ICD-10-CM | POA: Diagnosis not present

## 2020-10-29 DIAGNOSIS — R5383 Other fatigue: Secondary | ICD-10-CM | POA: Diagnosis not present

## 2020-10-29 DIAGNOSIS — I2510 Atherosclerotic heart disease of native coronary artery without angina pectoris: Secondary | ICD-10-CM | POA: Diagnosis not present

## 2020-10-29 DIAGNOSIS — R001 Bradycardia, unspecified: Secondary | ICD-10-CM

## 2020-10-29 DIAGNOSIS — F419 Anxiety disorder, unspecified: Secondary | ICD-10-CM

## 2020-10-29 MED ORDER — METOPROLOL SUCCINATE ER 25 MG PO TB24: 25.0000 mg | ORAL_TABLET | Freq: Every day | ORAL | 0 refills | Status: DC

## 2020-10-29 MED ORDER — ALPRAZOLAM 0.25 MG PO TABS: 0.2500 mg | ORAL_TABLET | Freq: Every evening | ORAL | 0 refills | Status: DC | PRN

## 2020-10-29 MED ORDER — CITALOPRAM HYDROBROMIDE 10 MG PO TABS
10.0000 mg | ORAL_TABLET | Freq: Every day | ORAL | 0 refills | Status: DC
Start: 2020-10-29 — End: 2020-12-19

## 2020-10-29 NOTE — Assessment & Plan Note (Signed)
She agrees with trying Celexa 10 mg daily and Xanax 0.25 mg at bedtime prn. We discussed some side effects of medications and the risk of possible interaction with some of her medications. F/U in 4 weeks,before if needed.

## 2020-10-29 NOTE — Progress Notes (Signed)
Chief Complaint  Patient presents with   Insomnia   HPI:  Jenna [name] is a 65 y.o. female with history of hypertension, CAD, anxiety, lupus, ESRD s/p renal transplant, immunosuppressed state,and atrial fibrillation here today with some concerns. She has not had new problems since her last visit.  She was last seen on 10/13/20,virtual visit.  She recently saw cardiologist, Dr Lamonte Sakai, 10/24/2020. Planned on referring her to EP to discussed ICD.  BP mildly low today and bradycardia. She has had similar problems in the past, for which reason antihypertensive medications were discontinued. She is on Metoprolol succinate 50 mg daily and Bidil 20-37.5 mg tid. Negative for CP,palpitations,or diaphoresis. DOE, stable. HFrEF with LVEF 20-25% in 08/2020.  LE edema has improved with Torsemide, which does was increased and now taking 60 mg Am and 40 mg pm. She recently saw her nephrologist and had blood work done.  Atrial fib, she is not longer on Eliquis.  She is very frustrated with not been able to sleep. She that frequently she is up all night. Pain does not interfere with sleep this time.   Breathing issues frequently affect her sleep. Sleeping during the day, tries not to take naps.  She is on Trazodone 50 mg discontinued, it helped initially. Still anxious, feeling overwhelmed. Trazodone did not help with anxiety either. She took Lexapro 5 mg and Buspar, she is not sure why meds were discontinued. She states that one time she thought that she would be better off being death, thought about taking all meds at th same time but has not reoccured and she knows she will not do any thing to harm herself. Her daughter calls her daily, she was on the phone,on speaker, at the beginning of her visit.  She has not established with psychiatrists as she was planning on ding so. + Fatigue. She has an aid coming in a few days to help with ADL's.  She is also trying to move to one story  house, she has stairs and it is becoming very difficult to go up and down. She uses a cane for transfer. She drives herself.  States that she received housing information from "section 8" but she missed place forms, planning on calling back to get applications. She also has a Education officer, museum.  Peripheral neuropathy: She is on Gabapentin 100 mg at bedtime that seems to be helping with peripheral neuropathy, so she did not titrate dose up to 300 mg as planned last visit. Today she is in a wheel chair.  Review of Systems  Constitutional:  Positive for fatigue. Negative for activity change and appetite change.  HENT:  Negative for mouth sores, nosebleeds and sore throat.   Eyes:  Negative for redness and visual disturbance.  Respiratory:  Negative for cough and wheezing.   Gastrointestinal:  Negative for abdominal pain and nausea.       Negative for changes in bowel habits.  Endocrine: Negative for cold intolerance and heat intolerance.  Genitourinary:  Negative for decreased urine volume, dysuria and hematuria.  Musculoskeletal:  Positive for gait problem. Negative for myalgias.  Skin:  Negative for pallor and rash.  Neurological:  Negative for syncope and facial asymmetry.  Psychiatric/Behavioral:  Positive for sleep disturbance. Negative for confusion and hallucinations. The patient is nervous/anxious.   Rest of ROS, see pertinent positives sand negatives in HPI  No current facility-administered medications on file prior to visit.   Current Outpatient Medications on File Prior to Visit  Medication Sig  Dispense Refill   acetaminophen (TYLENOL) 500 MG tablet Take 1,000 mg by mouth every 6 (six) hours as needed for headache.     aspirin 81 MG EC tablet Take 81 mg by mouth every morning.     gabapentin (NEURONTIN) 100 MG capsule Take 2 capsules (200 mg total) by mouth daily. (Patient taking differently: Take 100 mg by mouth at bedtime.) 20 capsule 0   hydroxychloroquine (PLAQUENIL) 200 MG  tablet Take 1 tablet (200 mg total) by mouth 2 (two) times daily. (Patient taking differently: Take 200 mg by mouth daily.) 180 tablet 3   isosorbide-hydrALAZINE (BIDIL) 20-37.5 MG tablet Take 1 tablet by mouth 3 (three) times daily. 90 tablet 0   nitroGLYCERIN (NITROSTAT) 0.4 MG SL tablet Place 0.4 mg under the tongue every 5 (five) minutes as needed for chest pain.      predniSONE (DELTASONE) 5 MG tablet Take 5 mg by mouth daily with lunch.     rosuvastatin (CRESTOR) 5 MG tablet Take 5 mg by mouth daily at 6 PM.     simethicone (MYLICON) 80 MG chewable tablet Chew 80 mg by mouth 4 (four) times daily as needed for flatulence.     tacrolimus (PROGRAF) 0.5 MG capsule Take 0.5-1 mg by mouth See admin instructions. 1 capsule every morning  2 capsules at bedtime     torsemide (DEMADEX) 20 MG tablet Take 20 mg by mouth 2 (two) times daily.     valGANciclovir (VALCYTE) 450 MG tablet Take 450 mg by mouth every other day.     Past Medical History:  Diagnosis Date   Anemia    Anxiety    panic attack- talks herself and takes deep breathes   CAD (coronary artery disease)    a. STEMI 10/2018 s/p DES to LAD.   Depression    Dyspnea    with exertion    ESRD (end stage renal disease) ([place])    hemo TTHSAT   Essential hypertension    FSGS (focal segmental glomerulosclerosis) 2011   By renal biopsy   Head injury    age 43   HFrEF (heart failure with reduced ejection fraction) ([place])    History of kidney stones    kidney stone   Hx of lupus nephritis 2011   by renal biopsy   Ischemic cardiomyopathy    Kidney transplant recipient    LA thrombus 10/2018   Lupus (systemic lupus erythematosus) ([place])    followed by Dr. 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 remote parietal infarct on brain MRI 12/2019.   Pericarditis    age 43ish   Renal stone    Secondary hyperparathyroidism of renal origin ([facility])    SLE  (systemic lupus erythematosus) ([name])    TIA (transient ischemic attack)    no residual effects   Allergies  Allergen Reactions   Enalapril Maleate Anaphylaxis, Swelling and Other (See Comments)    Throat swells   Penicillins Other (See Comments)    Made patient lightheaded PATIENT HAS HAD A PCN REACTION WITH IMMEDIATE RASH, FACIAL/TONGUE/THROAT SWELLING, SOB, OR LIGHTHEADEDNESS WITH HYPOTENSION:  #  #  YES  #  #  Has patient had a PCN reaction causing severe rash involving mucus membranes or skin necrosis: No Has patient had a PCN reaction that required hospitalization: No Has patient had a PCN reaction occurring within the last 10 years: No If  all of the above answers are "NO", then may proceed with Cephalosporin use.    Chocolate Nausea And Vomiting   Tape Itching, Rash and Other (See Comments)    Social History   Socioeconomic History   Marital status: Legally Separated    Spouse name: Not on file   Number of children: 1   Years of education: 12   Highest education level: 12th grade  Occupational History   Not on file  Tobacco Use   Smoking status: Never    Passive exposure: Past   Smokeless tobacco: Never  Vaping Use   Vaping Use: Never used  Substance and Sexual Activity   Alcohol use: No   Drug use: No   Sexual activity: Not Currently  Other Topics Concern   Not on file  Social History Narrative   Right Handed   Lives in a two story home   Daughter recently got married    Social Determinants of Health   Financial Resource Strain: Low Risk    Difficulty of Paying Living Expenses: Not very hard  Food Insecurity: No Food Insecurity   Worried About Charity fundraiser in the Last Year: Never true   Ran Out of Food in the Last Year: Never true  Transportation Needs: No Transportation Needs   Lack of Transportation (Medical): No   Lack of Transportation (Non-Medical): No  Physical Activity: Sufficiently Active   Days of Exercise per Week: 5 days   Minutes of  Exercise per Session: 30 min  Stress: Stress Concern Present   Feeling of Stress : To some extent  Social Connections: Moderately Isolated   Frequency of Communication with Friends and Family: More than three times a week   Frequency of Social Gatherings with Friends and Family: More than three times a week   Attends Religious Services: 1 to 4 times per year   Active Member of Clubs or Organizations: No   Attends Archivist Meetings: Never   Marital Status: Separated   Vitals:   10/29/20 1108  BP: 90/60  Pulse: (!) 48  Resp: 16  SpO2: 98%   Body mass index is 31.39 kg/m.  Physical Exam Vitals and nursing note reviewed.  Constitutional:      General: She is not in acute distress.    Appearance: She is well-developed.  HENT:     Head: Normocephalic and atraumatic.     Mouth/Throat:     Mouth: Mucous membranes are moist.     Pharynx: Oropharynx is clear.  Eyes:     Conjunctiva/sclera: Conjunctivae normal.  Cardiovascular:     Rate and Rhythm: Regular rhythm. Bradycardia present.     Heart sounds: No murmur heard. Pulmonary:     Effort: Pulmonary effort is normal. No respiratory distress.     Breath sounds: Normal breath sounds.  Abdominal:     Palpations: Abdomen is soft.     Tenderness: There is no abdominal tenderness.  Musculoskeletal:     Right lower leg: 1+ Pitting Edema present.     Left lower leg: 1+ Pitting Edema present.  Lymphadenopathy:     Cervical: No cervical adenopathy.  Skin:    General: Skin is warm.     Findings: No erythema or rash.  Neurological:     General: No focal deficit present.     Mental Status: She is alert and oriented to person, place, and time.     Cranial Nerves: No cranial nerve deficit.     Comments: During  visit she is in a wheel chair. Gait mildly unstable, assisted with a cane.  Psychiatric:        Mood and Affect: Mood is anxious.     Comments: Poor eye contact.   ASSESSMENT AND PLAN:  Ms. CAREN GARSKE was  seen today for follow-up.  Diagnoses and all orders for this visit:  Fatigue, unspecified type We discussed possible etiologies: Systemic illness, immunologic,endocrinology,sleep disorder, psychiatric/psychologic, infectious,medications side effects, and idiopathic. A few of her chronic medical problems and medications can be contributing factors.  Bradycardia Hemodynamically stable, so I do not think she needs to go to the ER now. Metoprolol succinate decreased from 50 mg to 25 mg daily. Strongly recommend monitoring HR daily. If persistent BB may need to be weaned off.  Hypertension with renal disease Mildly hypotense today. Recommend decreasing dose of Metoprolol Succinate from 50 mg to 25 mg daily. For now no changes in Bidil or Torsemide dose. Monitor BP at home. Continue low salt diet. Instructed about warning signs.  Peripheral neuropathy Problem has improved, so continue Gabapentin 100 mg daily. Some side effects discussed. Appropriate foot care to continue.  Anxiety disorder She agrees with trying Celexa 10 mg daily and Xanax 0.25 mg at bedtime prn. We discussed some side effects of medications and the risk of possible interaction with some of her medications. F/U in 4 weeks,before if needed.  Insomnia Problem seems to be aggravated by anxiety and CHF symptoms. Recommend elevating head of bed,placing more pillows, which may help with orthopnea. Torsemide recently adjusted. Xanax 0.25 mg at bedtime prn may also help. Good sleep hygiene.  I spent a total of 49 minutes in both face to face and non face to face activities for this visit on the date of this encounter. During this time history was obtained and documented, examination was performed, prior labs/imaging reviewed, and assessment/plan discussed. She is not interested in assisted living, hoping that if she moves to one story house and has an aid to helps her with ADL's, she can continue living independent. She  will try to obtain application forms for Section 8 housing, her social worker can assist with this.  Return in about 4 weeks (around 11/26/2020).   Jenna Whidbee G. [name], MD  [facility]. [place] office.

## 2020-10-29 NOTE — Assessment & Plan Note (Signed)
Problem seems to be aggravated by anxiety and CHF symptoms. Recommend elevating head of bed,placing more pillows, which may help with orthopnea. Torsemide recently adjusted. Xanax 0.25 mg at bedtime prn may also help. Good sleep hygiene.

## 2020-10-29 NOTE — Assessment & Plan Note (Signed)
Problem has improved, so continue Gabapentin 100 mg daily. Some side effects discussed. Appropriate foot care to continue.

## 2020-10-29 NOTE — Assessment & Plan Note (Signed)
Mildly hypotense today. Recommend decreasing dose of Metoprolol Succinate from 50 mg to 25 mg daily. For now no changes in Bidil or Torsemide dose. Monitor BP at home. Continue low salt diet. Instructed about warning signs.

## 2020-10-29 NOTE — Patient Instructions (Addendum)
A few things to remember from today's visit:   Insomnia, unspecified type  Anxiety disorder, unspecified type - Plan: citalopram (CELEXA) 10 MG tablet, ALPRAZolam (XANAX) 0.25 MG tablet  Hypertension with renal disease - Plan: metoprolol succinate (TOPROL-XL) 25 MG 24 hr tablet  Other polyneuropathy  Fatigue, unspecified type  If you need refills please call your pharmacy. Do not use My Chart to request refills or for acute issues that need immediate attention.   Please be sure medication list is accurate. If a new problem present, please set up appointment sooner than planned today.  Changes today: Metoprolol succinate decreased from 50 mg to 25 mg. We need to monitor blood pressure and heart rate at home.  Continue Gabapentin 100 mg at bedtime,it seems to be helping. Today we are going to start Celexa 10 mg once daily and Xanax at bedtime. Elevating head of bed or placing more pillows may also help with sleep. I will see you back in 4 weeks.

## 2020-10-31 ENCOUNTER — Encounter: Payer: Self-pay | Admitting: Family Medicine

## 2020-10-31 ENCOUNTER — Telehealth: Payer: Self-pay

## 2020-10-31 NOTE — Telephone Encounter (Signed)
Patient called back asking for clarification of the medications and to request dose change Citalopram 10 mg to a 5 mg tablet pt stated its to strong for her

## 2020-10-31 NOTE — Telephone Encounter (Signed)
Patient called in with questions related to her new prescriptions, Citalopram 10 mg and the Xanax 0.25 mg. Patient took the Citalopram during the day, and it made her sleep all day long, she wasn't able to wake up fully. She wanted to know if the Citalopram and Xanax could be switched. I spoke with Dr. [name] and she okayed for pt to take the Citalopram at bedtime and the Xanax during the day time. Patient will start out with 1/2 tablet of the Citalopram at bedtime and let us know how she is doing next week.

## 2020-11-01 ENCOUNTER — Emergency Department (HOSPITAL_COMMUNITY): Payer: Medicare Other

## 2020-11-01 ENCOUNTER — Other Ambulatory Visit: Payer: Self-pay

## 2020-11-01 ENCOUNTER — Encounter (HOSPITAL_COMMUNITY): Payer: Self-pay

## 2020-11-01 ENCOUNTER — Inpatient Hospital Stay (HOSPITAL_COMMUNITY)
Admission: EM | Admit: 2020-11-01 | Discharge: 2020-11-12 | DRG: 698 | Disposition: A | Discharge status: Skilled Nursing Facility | Payer: Medicare Other | Attending: Internal Medicine | Admitting: Internal Medicine

## 2020-11-01 DIAGNOSIS — F32A Depression, unspecified: Secondary | ICD-10-CM | POA: Diagnosis present

## 2020-11-01 DIAGNOSIS — Z7952 Long term (current) use of systemic steroids: Secondary | ICD-10-CM

## 2020-11-01 DIAGNOSIS — D631 Anemia in chronic kidney disease: Secondary | ICD-10-CM | POA: Diagnosis present

## 2020-11-01 DIAGNOSIS — D84821 Immunodeficiency due to drugs: Secondary | ICD-10-CM | POA: Diagnosis present

## 2020-11-01 DIAGNOSIS — M329 Systemic lupus erythematosus, unspecified: Secondary | ICD-10-CM

## 2020-11-01 DIAGNOSIS — R001 Bradycardia, unspecified: Secondary | ICD-10-CM | POA: Diagnosis present

## 2020-11-01 DIAGNOSIS — N179 Acute kidney failure, unspecified: Secondary | ICD-10-CM | POA: Diagnosis not present

## 2020-11-01 DIAGNOSIS — Y830 Surgical operation with transplant of whole organ as the cause of abnormal reaction of the patient, or of later complication, without mention of misadventure at the time of the procedure: Secondary | ICD-10-CM | POA: Diagnosis present

## 2020-11-01 DIAGNOSIS — Z833 Family history of diabetes mellitus: Secondary | ICD-10-CM

## 2020-11-01 DIAGNOSIS — K921 Melena: Secondary | ICD-10-CM | POA: Diagnosis present

## 2020-11-01 DIAGNOSIS — I480 Paroxysmal atrial fibrillation: Secondary | ICD-10-CM | POA: Diagnosis present

## 2020-11-01 DIAGNOSIS — N184 Chronic kidney disease, stage 4 (severe): Secondary | ICD-10-CM | POA: Diagnosis not present

## 2020-11-01 DIAGNOSIS — Z91018 Allergy to other foods: Secondary | ICD-10-CM

## 2020-11-01 DIAGNOSIS — Z888 Allergy status to other drugs, medicaments and biological substances status: Secondary | ICD-10-CM

## 2020-11-01 DIAGNOSIS — R195 Other fecal abnormalities: Secondary | ICD-10-CM

## 2020-11-01 DIAGNOSIS — R0602 Shortness of breath: Secondary | ICD-10-CM

## 2020-11-01 DIAGNOSIS — I2510 Atherosclerotic heart disease of native coronary artery without angina pectoris: Secondary | ICD-10-CM | POA: Diagnosis present

## 2020-11-01 DIAGNOSIS — B965 Pseudomonas (aeruginosa) (mallei) (pseudomallei) as the cause of diseases classified elsewhere: Secondary | ICD-10-CM | POA: Diagnosis present

## 2020-11-01 DIAGNOSIS — N12 Tubulo-interstitial nephritis, not specified as acute or chronic: Secondary | ICD-10-CM | POA: Diagnosis not present

## 2020-11-01 DIAGNOSIS — Z87442 Personal history of urinary calculi: Secondary | ICD-10-CM

## 2020-11-01 DIAGNOSIS — I129 Hypertensive chronic kidney disease with stage 1 through stage 4 chronic kidney disease, or unspecified chronic kidney disease: Secondary | ICD-10-CM | POA: Diagnosis present

## 2020-11-01 DIAGNOSIS — I252 Old myocardial infarction: Secondary | ICD-10-CM

## 2020-11-01 DIAGNOSIS — F410 Panic disorder [episodic paroxysmal anxiety] without agoraphobia: Secondary | ICD-10-CM | POA: Diagnosis present

## 2020-11-01 DIAGNOSIS — Z8673 Personal history of transient ischemic attack (TIA), and cerebral infarction without residual deficits: Secondary | ICD-10-CM

## 2020-11-01 DIAGNOSIS — I130 Hypertensive heart and chronic kidney disease with heart failure and stage 1 through stage 4 chronic kidney disease, or unspecified chronic kidney disease: Secondary | ICD-10-CM | POA: Diagnosis present

## 2020-11-01 DIAGNOSIS — T8613 Kidney transplant infection: Secondary | ICD-10-CM | POA: Diagnosis present

## 2020-11-01 DIAGNOSIS — J9811 Atelectasis: Secondary | ICD-10-CM | POA: Diagnosis present

## 2020-11-01 DIAGNOSIS — R160 Hepatomegaly, not elsewhere classified: Secondary | ICD-10-CM | POA: Diagnosis present

## 2020-11-01 DIAGNOSIS — Z6832 Body mass index (BMI) 32.0-32.9, adult: Secondary | ICD-10-CM

## 2020-11-01 DIAGNOSIS — M3214 Glomerular disease in systemic lupus erythematosus: Secondary | ICD-10-CM | POA: Diagnosis present

## 2020-11-01 DIAGNOSIS — K5900 Constipation, unspecified: Secondary | ICD-10-CM | POA: Diagnosis present

## 2020-11-01 DIAGNOSIS — Z95810 Presence of automatic (implantable) cardiac defibrillator: Secondary | ICD-10-CM

## 2020-11-01 DIAGNOSIS — R1011 Right upper quadrant pain: Secondary | ICD-10-CM

## 2020-11-01 DIAGNOSIS — F419 Anxiety disorder, unspecified: Secondary | ICD-10-CM

## 2020-11-01 DIAGNOSIS — Z5329 Procedure and treatment not carried out because of patient's decision for other reasons: Secondary | ICD-10-CM | POA: Diagnosis not present

## 2020-11-01 DIAGNOSIS — T8619 Other complication of kidney transplant: Secondary | ICD-10-CM | POA: Diagnosis not present

## 2020-11-01 DIAGNOSIS — I5020 Unspecified systolic (congestive) heart failure: Secondary | ICD-10-CM | POA: Diagnosis present

## 2020-11-01 DIAGNOSIS — I5023 Acute on chronic systolic (congestive) heart failure: Secondary | ICD-10-CM | POA: Diagnosis present

## 2020-11-01 DIAGNOSIS — Z8249 Family history of ischemic heart disease and other diseases of the circulatory system: Secondary | ICD-10-CM

## 2020-11-01 DIAGNOSIS — Z7982 Long term (current) use of aspirin: Secondary | ICD-10-CM

## 2020-11-01 DIAGNOSIS — E872 Acidosis: Secondary | ICD-10-CM | POA: Diagnosis present

## 2020-11-01 DIAGNOSIS — I501 Left ventricular failure: Secondary | ICD-10-CM

## 2020-11-01 DIAGNOSIS — J90 Pleural effusion, not elsewhere classified: Secondary | ICD-10-CM

## 2020-11-01 DIAGNOSIS — Z79899 Other long term (current) drug therapy: Secondary | ICD-10-CM

## 2020-11-01 DIAGNOSIS — E669 Obesity, unspecified: Secondary | ICD-10-CM | POA: Diagnosis present

## 2020-11-01 DIAGNOSIS — G6289 Other specified polyneuropathies: Secondary | ICD-10-CM

## 2020-11-01 DIAGNOSIS — D1809 Hemangioma of other sites: Secondary | ICD-10-CM | POA: Diagnosis present

## 2020-11-01 DIAGNOSIS — D61818 Other pancytopenia: Secondary | ICD-10-CM | POA: Diagnosis present

## 2020-11-01 DIAGNOSIS — D696 Thrombocytopenia, unspecified: Secondary | ICD-10-CM | POA: Diagnosis present

## 2020-11-01 DIAGNOSIS — Z880 Allergy status to penicillin: Secondary | ICD-10-CM

## 2020-11-01 DIAGNOSIS — E785 Hyperlipidemia, unspecified: Secondary | ICD-10-CM | POA: Diagnosis present

## 2020-11-01 DIAGNOSIS — Z20822 Contact with and (suspected) exposure to covid-19: Secondary | ICD-10-CM | POA: Diagnosis present

## 2020-11-01 DIAGNOSIS — E871 Hypo-osmolality and hyponatremia: Secondary | ICD-10-CM | POA: Diagnosis not present

## 2020-11-01 DIAGNOSIS — Z955 Presence of coronary angioplasty implant and graft: Secondary | ICD-10-CM

## 2020-11-01 LAB — COMPREHENSIVE METABOLIC PANEL
ALT: 33 U/L (ref 0–44)
AST: 31 U/L (ref 15–41)
Albumin: 3.2 g/dL — ABNORMAL LOW (ref 3.5–5.0)
Alkaline Phosphatase: 115 U/L (ref 38–126)
Anion gap: 11 (ref 5–15)
BUN: 85 mg/dL — ABNORMAL HIGH (ref 8–23)
CO2: 19 mmol/L — ABNORMAL LOW (ref 22–32)
Calcium: 9.5 mg/dL (ref 8.9–10.3)
Chloride: 101 mmol/L (ref 98–111)
Creatinine, Ser: 4.73 mg/dL — ABNORMAL HIGH (ref 0.44–1.00)
GFR, Estimated: 10 mL/min — ABNORMAL LOW (ref >60)
Glucose, Bld: 101 mg/dL — ABNORMAL HIGH (ref 70–99)
Potassium: 5 mmol/L (ref 3.5–5.1)
Sodium: 131 mmol/L — ABNORMAL LOW (ref 135–145)
Total Bilirubin: 1.1 mg/dL (ref 0.3–1.2)
Total Protein: 6.7 g/dL (ref 6.5–8.1)

## 2020-11-01 LAB — CBC
HCT: 32.6 % — ABNORMAL LOW (ref 36.0–46.0)
Hemoglobin: 10.6 g/dL — ABNORMAL LOW (ref 12.0–15.0)
MCH: 29.5 pg (ref 26.0–34.0)
MCHC: 32.5 g/dL (ref 30.0–36.0)
MCV: 90.8 fL (ref 80.0–100.0)
Platelets: 96 10*3/uL — ABNORMAL LOW (ref 150–400)
RBC: 3.59 MIL/uL — ABNORMAL LOW (ref 3.87–5.11)
RDW: 22.2 % — ABNORMAL HIGH (ref 11.5–15.5)
WBC: 3.5 10*3/uL — ABNORMAL LOW (ref 4.0–10.5)
nRBC: 0.6 % — ABNORMAL HIGH (ref 0.0–0.2)

## 2020-11-01 LAB — URINALYSIS, MICROSCOPIC (REFLEX): WBC, UA: >50 WBC/hpf (ref 0–5)

## 2020-11-01 LAB — URINALYSIS, ROUTINE W REFLEX MICROSCOPIC
Bilirubin Urine: NEGATIVE
Glucose, UA: NEGATIVE mg/dL
Hgb urine dipstick: NEGATIVE
Ketones, ur: NEGATIVE mg/dL
Nitrite: NEGATIVE
Protein, ur: 100 mg/dL — AB
Specific Gravity, Urine: 1.025 (ref 1.005–1.030)
pH: 6 (ref 5.0–8.0)

## 2020-11-01 LAB — LIPASE, BLOOD: Lipase: 26 U/L (ref 11–51)

## 2020-11-01 LAB — POC OCCULT BLOOD, ED: Fecal Occult Bld: POSITIVE — AB

## 2020-11-01 MED ORDER — ROSUVASTATIN CALCIUM 5 MG PO TABS
5.0000 mg | ORAL_TABLET | Freq: Every day | ORAL | Status: DC
  Administered 2020-11-02 – 2020-11-11 (×10): 5 mg via ORAL
  Filled 2020-11-01 (×11): qty 1

## 2020-11-01 MED ORDER — ONDANSETRON HCL 4 MG PO TABS
4.0000 mg | ORAL_TABLET | Freq: Four times a day (QID) | ORAL | Status: DC | PRN
  Filled 2020-11-01 (×2): qty 1

## 2020-11-01 MED ORDER — ACETAMINOPHEN 650 MG RE SUPP: 650.0000 mg | Freq: Four times a day (QID) | RECTAL | Status: DC | PRN

## 2020-11-01 MED ORDER — SODIUM CHLORIDE 0.9 % IV BOLUS
500.0000 mL | Freq: Once | INTRAVENOUS | Status: AC
  Administered 2020-11-01: 500 mL via INTRAVENOUS

## 2020-11-01 MED ORDER — CITALOPRAM HYDROBROMIDE 10 MG PO TABS
10.0000 mg | ORAL_TABLET | Freq: Every day | ORAL | Status: DC
  Administered 2020-11-02 – 2020-11-12 (×11): 10 mg via ORAL
  Filled 2020-11-01 (×11): qty 1

## 2020-11-01 MED ORDER — HYDROXYCHLOROQUINE SULFATE 200 MG PO TABS
200.0000 mg | ORAL_TABLET | Freq: Every day | ORAL | Status: DC
  Administered 2020-11-02 – 2020-11-12 (×11): 200 mg via ORAL
  Filled 2020-11-01 (×11): qty 1

## 2020-11-01 MED ORDER — ONDANSETRON HCL 4 MG/2ML IJ SOLN: 4.0000 mg | Freq: Four times a day (QID) | INTRAMUSCULAR | Status: DC | PRN

## 2020-11-01 MED ORDER — ONDANSETRON HCL 4 MG/2ML IJ SOLN
4.0000 mg | Freq: Once | INTRAMUSCULAR | Status: AC
  Administered 2020-11-01: 4 mg via INTRAVENOUS
  Filled 2020-11-01: qty 2

## 2020-11-01 MED ORDER — PANTOPRAZOLE 80MG IVPB - SIMPLE MED
80.0000 mg | Freq: Once | INTRAVENOUS | Status: AC
  Administered 2020-11-01: 80 mg via INTRAVENOUS
  Filled 2020-11-01: qty 80

## 2020-11-01 MED ORDER — ACETAMINOPHEN 325 MG PO TABS
650.0000 mg | ORAL_TABLET | Freq: Four times a day (QID) | ORAL | Status: DC | PRN
  Administered 2020-11-02 – 2020-11-12 (×17): 650 mg via ORAL
  Filled 2020-11-01 (×18): qty 2

## 2020-11-01 MED ORDER — SODIUM CHLORIDE 0.9% FLUSH
3.0000 mL | Freq: Two times a day (BID) | INTRAVENOUS | Status: DC
  Administered 2020-11-02 – 2020-11-12 (×16): 3 mL via INTRAVENOUS

## 2020-11-01 MED ORDER — SODIUM CHLORIDE 0.9 % IV SOLN
1.0000 g | Freq: Every day | INTRAVENOUS | Status: DC
  Administered 2020-11-02: 1 g via INTRAVENOUS

## 2020-11-01 MED ORDER — FENTANYL CITRATE PF 50 MCG/ML IJ SOSY
50.0000 ug | PREFILLED_SYRINGE | Freq: Once | INTRAMUSCULAR | Status: AC
Start: 2020-11-01 — End: 2020-11-01
  Administered 2020-11-01: 50 ug via INTRAVENOUS
  Filled 2020-11-01: qty 1

## 2020-11-01 MED ORDER — PREDNISONE 5 MG PO TABS
5.0000 mg | ORAL_TABLET | Freq: Every day | ORAL | Status: DC
  Administered 2020-11-02 – 2020-11-12 (×11): 5 mg via ORAL
  Filled 2020-11-01 (×11): qty 1

## 2020-11-01 MED ORDER — TACROLIMUS 0.5 MG PO CAPS
0.5000 mg | ORAL_CAPSULE | Freq: Every day | ORAL | Status: DC
  Administered 2020-11-02 – 2020-11-12 (×11): 0.5 mg via ORAL
  Filled 2020-11-01 (×11): qty 1

## 2020-11-01 MED ORDER — GABAPENTIN 100 MG PO CAPS
100.0000 mg | ORAL_CAPSULE | Freq: Every day | ORAL | Status: DC
  Administered 2020-11-01 – 2020-11-11 (×11): 100 mg via ORAL
  Filled 2020-11-01 (×11): qty 1

## 2020-11-01 MED ORDER — VALGANCICLOVIR HCL 450 MG PO TABS
450.0000 mg | ORAL_TABLET | ORAL | Status: DC
  Administered 2020-11-03: 450 mg via ORAL
  Filled 2020-11-01 (×2): qty 1

## 2020-11-01 MED ORDER — TACROLIMUS 0.5 MG PO CAPS
0.5000 mg | ORAL_CAPSULE | ORAL | Status: DC
Start: 2020-11-01 — End: 2020-11-01

## 2020-11-01 MED ORDER — SODIUM CHLORIDE 0.9 % IV SOLN
1.0000 g | Freq: Once | INTRAVENOUS | Status: AC
  Administered 2020-11-01: 1 g via INTRAVENOUS
  Filled 2020-11-01: qty 10

## 2020-11-01 MED ORDER — TACROLIMUS 1 MG PO CAPS
1.0000 mg | ORAL_CAPSULE | Freq: Every day | ORAL | Status: DC
  Administered 2020-11-01 – 2020-11-08 (×8): 1 mg via ORAL
  Filled 2020-11-01 (×8): qty 1

## 2020-11-01 MED ORDER — FENTANYL CITRATE PF 50 MCG/ML IJ SOSY
50.0000 ug | PREFILLED_SYRINGE | Freq: Once | INTRAMUSCULAR | Status: AC
  Administered 2020-11-01: 50 ug via INTRAVENOUS
  Filled 2020-11-01: qty 1

## 2020-11-01 MED ORDER — PANTOPRAZOLE INFUSION (NEW) - SIMPLE MED
8.0000 mg/h | INTRAVENOUS | Status: DC
  Administered 2020-11-01 – 2020-11-04 (×6): 8 mg/h via INTRAVENOUS
  Filled 2020-11-01 (×5): qty 80
  Filled 2020-11-01: qty 100
  Filled 2020-11-01: qty 80

## 2020-11-01 NOTE — ED Provider Notes (Signed)
[facility] EMERGENCY DEPARTMENT Provider Note   CSN: [id number] Arrival date & time: 11/01/20  1125     History No chief complaint on file.   Jenna [name] is a 65 y.o. female with a history of CAD (status post DES to LAD), paroxysmal atrial fibrillation, congestive heart failure, status post right renal transplant (on tacrolimus, Mylicon), systemic lupus, TIA.  Presents to the emergency department with a chief complaint of constipation, dark stool and abdominal discomfort.  Patient reports persistence of been present over the last 2 to 3 days.  Patient reports that her "stomach feels weird."  Discomfort is noted to right lower quadrant.  Patient reports that pain has been constant.  Pain is worse with eating.  Patient denies trying any modalities to alleviate her symptoms.  Patient reports that she has been constipated and straining for the last 2 days.  Patient reports that she is able to produce a small amount of black stool yesterday.  Patient notes frank red blood on toilet paper when wiping.  Patient reports that she is not on any blood thinners.  Endorses nausea and chills.  Denies any dysuria, urinary frequency, urinary urgency, flank pain, vaginal pain, vaginal bleeding, vaginal discharge, fevers, chills, chest pain, shortness of breath.    HPI     Past Medical History:  Diagnosis Date   Anemia    Anxiety    panic attack- talks herself and takes deep breathes   CAD (coronary artery disease)    a. STEMI 10/2018 s/p DES to LAD.   Depression    Dyspnea    with exertion    ESRD (end stage renal disease) ([place])    hemo TTHSAT   Essential hypertension    FSGS (focal segmental glomerulosclerosis) 2011   By renal biopsy   Head injury    age 48   HFrEF (heart failure with reduced ejection fraction) ([place])    History of kidney stones    kidney stone   Hx of lupus nephritis 2011   by renal biopsy   Ischemic cardiomyopathy    Kidney transplant recipient     LA thrombus 10/2018   Lupus (systemic lupus erythematosus) ([place])    followed by Dr. 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 remote parietal infarct on brain MRI 12/2019.   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   Elevated troponin 07/11/2020   Anemia of chronic disease 07/11/2020   Acute kidney injury superimposed on chronic kidney disease ([place]) 07/11/2020   Allergic rhinitis 01/01/2020   Anxiety disorder 06/26/2019   HFrEF (heart failure with reduced ejection fraction) ([place]) 03/13/2019   CAD (coronary artery disease) 2019/03/13   Paroxysmal atrial fibrillation ([place]) March 13, 2019   Deceased-donor kidney transplant 03/27/2018   Dyslipidemia 11/06/2017   CKD (chronic kidney disease) stage 5, GFR less than 15 ml/min ([name]) 02/15/2017   Back pain at L4-L5 level 10/31/2014  Muscle spasm of back 10/31/2014   Chronic maxillary sinusitis 07/04/2014   Occipital headache 07/04/2014   Increased urinary frequency 05/23/2014   Falls 05/23/2014   Rash and nonspecific skin eruption 05/23/2014   Hypertension with renal disease 04/16/2014   Encounter for immunization 11/19/2013   Lupus (systemic lupus erythematosus) ([place]) 09/24/2013   TIA (transient ischemic attack) 05/05/2013   Numbness and tingling of left side of face 05/04/2013   Lupus ([place]) 04/15/2011   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     Past Surgical History:  Procedure Laterality Date   AV FISTULA PLACEMENT Left 07/04/2017   Procedure: ARTERIOVENOUS (AV) FISTULA CREATION BRACHIOCEPHALIC;  Surgeon: [name], MD;  Location: [facility];  Service: Vascular;  Laterality: Left;   COLONOSCOPY W/ POLYPECTOMY     IR FLUORO GUIDE CV LINE RIGHT  06/28/2017   IR US GUIDE VASC ACCESS RIGHT  06/28/2017   KIDNEY SURGERY     kidney transplant 2020   REVISION OF ARTERIOVENOUS GORETEX GRAFT Left 11/30/2017   Procedure: REVISION OF ARTERIOVENOUS FISTULA LEFT ARM Superfistulization and branch ligation.;  Surgeon: Waynetta Sandy, MD;  Location: [facility];  Service: Vascular;  Laterality: Left;     OB History     Gravida  2   Para  1   Term      Preterm      AB  1   Living  1      SAB  1   IAB      Ectopic      Multiple      Live Births              Family History  Problem Relation Age of Onset   Diabetes Mother    Hypertension Father    Cancer Sister    Hypertension Brother     Social History   Tobacco Use   Smoking status: Never    Passive exposure: Past   Smokeless tobacco: Never  Vaping Use   Vaping Use: Never used  Substance Use Topics   Alcohol use: No   Drug use: No    Home Medications Prior to Admission medications   Medication Sig Start Date End Date Taking? Authorizing Provider  acetaminophen (TYLENOL) 500 MG tablet Take 1,000 mg by mouth every 6 (six) hours as needed for headache.    [provider]  ALPRAZolam Duanne Moron) 0.25 MG tablet Take 1 tablet (0.25 mg total) by mouth at bedtime as needed for anxiety. 10/29/20   [name], Betty G, MD  aspirin 81 MG EC tablet Take 81 mg by mouth every morning. 11/21/19   [provider]  citalopram (CELEXA) 10 MG tablet Take 1 tablet (10 mg total) by mouth daily. 10/29/20   [name], Betty G, MD  gabapentin (NEURONTIN) 100 MG capsule Take 2 capsules (200 mg total) by mouth daily. 10/13/20   [name], Betty G, MD  hydroxychloroquine  (PLAQUENIL) 200 MG tablet Take 1 tablet (200 mg total) by mouth 2 (two) times daily. 12/25/13   Tresa Garter, MD  isosorbide-hydrALAZINE (BIDIL) 20-37.5 MG tablet Take 1 tablet by mouth 3 (three) times daily. 08/26/20   Debbe Odea, MD  lidocaine (LIDODERM) 5 % Place 1 patch onto the skin daily. Remove & Discard patch within 12 hours or as directed by MD    [provider]  metoprolol succinate (TOPROL-XL) 25 MG 24 hr tablet Take 1 tablet (25 mg total) by mouth daily. 10/29/20  [name], Betty G, MD  nitroGLYCERIN (NITROSTAT) 0.4 MG SL tablet Place 0.4 mg under the tongue every 5 (five) minutes as needed for chest pain.     [provider]  pantoprazole (PROTONIX) 40 MG tablet Take 40 mg by mouth daily. 08/20/20   [provider]  predniSONE (DELTASONE) 5 MG tablet Take 5 mg by mouth daily with lunch. 05/16/19   [provider]  rosuvastatin (CRESTOR) 5 MG tablet Take 5 mg by mouth daily at 6 PM. 05/02/19   [provider]  simethicone (MYLICON) 80 MG chewable tablet Chew 80 mg by mouth 4 (four) times daily as needed for flatulence.    [provider]  tacrolimus (PROGRAF) 0.5 MG capsule Take 0.5-1 mg by mouth See admin instructions. Take 1 capsule (0.5 mg) by mouth every morning and 2 capsules (1 mg) at night    [provider]  torsemide (DEMADEX) 20 MG tablet Take 20 mg by mouth in the morning, at noon, and at bedtime.    [provider]  valGANciclovir (VALCYTE) 450 MG tablet Take 450 mg by mouth every other day.    [provider]    Allergies    Enalapril maleate, Penicillins, Chocolate, and Tape  Review of Systems   Review of Systems  Constitutional:  Positive for chills. Negative for fever.  Eyes:  Negative for visual disturbance.  Respiratory:  Negative for shortness of breath.   Cardiovascular:  Positive for leg swelling. Negative for chest pain.  Gastrointestinal:  Positive for abdominal pain, blood  in stool and constipation. Negative for abdominal distention, anal bleeding, diarrhea, nausea, rectal pain and vomiting.  Genitourinary:  Negative for decreased urine volume, difficulty urinating, dysuria, flank pain, frequency, genital sores, hematuria, pelvic pain, urgency, vaginal bleeding, vaginal discharge and vaginal pain.  Musculoskeletal:  Negative for back pain and neck pain.  Skin:  Negative for color change and rash.  Neurological:  Negative for dizziness, syncope, light-headedness and headaches.  Psychiatric/Behavioral:  Negative for confusion.    Physical Exam Updated Vital Signs BP 110/70 (BP Location: Left Arm)   Pulse (!) 33   Temp 98.8 F (37.1 C) (Oral)   Resp 16   Ht $R'5\' 7"'wo$  (1.702 m)   Wt 90 kg   SpO2 97%   BMI 31.09 kg/m   Physical Exam Vitals and nursing note reviewed. Exam conducted with a chaperone present (Female RN present as chaperone).  Constitutional:      General: She is not in acute distress.    Appearance: She is not ill-appearing, toxic-appearing or diaphoretic.  HENT:     Head: Normocephalic.  Eyes:     General: No scleral icterus.       Right eye: No discharge.        Left eye: No discharge.  Cardiovascular:     Rate and Rhythm: Normal rate.  Pulmonary:     Effort: Pulmonary effort is normal. No tachypnea, bradypnea or respiratory distress.     Breath sounds: Normal breath sounds. No stridor.  Abdominal:     General: Bowel sounds are normal. There is no distension. There are no signs of injury.     Palpations: Abdomen is soft. There is no mass or pulsatile mass.     Tenderness: There is abdominal tenderness in the right lower quadrant and periumbilical area. There is no right CVA tenderness, left CVA tenderness, guarding or rebound.     Hernia: There is no hernia in the umbilical area or ventral area.  Comments: Well-healed surgical scar noted to right lower quadrant abdomen.  Minimal amount of light brown stool noted in rectal vault.  No  melena or frank red blood.  No impaction.   Genitourinary:    Rectum: Guaiac result positive. No mass, tenderness, anal fissure, external hemorrhoid or internal hemorrhoid. Normal anal tone.  Musculoskeletal:     Right lower leg: Edema present.     Left lower leg: Edema present.  Skin:    General: Skin is warm and dry.  Neurological:     General: No focal deficit present.     Mental Status: She is alert.  Psychiatric:        Behavior: Behavior is cooperative.    ED Results / Procedures / Treatments   Labs (all labs ordered are listed, but only abnormal results are displayed) Labs Reviewed  COMPREHENSIVE METABOLIC PANEL - Abnormal; Notable for the following components:      Result Value   Sodium 131 (*)    CO2 19 (*)    Glucose, Bld 101 (*)    BUN 85 (*)    Creatinine, Ser 4.73 (*)    Albumin 3.2 (*)    GFR, Estimated 10 (*)    All other components within normal limits  CBC - Abnormal; Notable for the following components:   WBC 3.5 (*)    RBC 3.59 (*)    Hemoglobin 10.6 (*)    HCT 32.6 (*)    RDW 22.2 (*)    Platelets 96 (*)    nRBC 0.6 (*)    All other components within normal limits  POC OCCULT BLOOD, ED - Abnormal; Notable for the following components:   Fecal Occult Bld POSITIVE (*)    All other components within normal limits  LIPASE, BLOOD  URINALYSIS, ROUTINE W REFLEX MICROSCOPIC    EKG None  Radiology CT ABDOMEN PELVIS WO CONTRAST  Result Date: 11/01/2020 CLINICAL DATA:  Right lower quadrant pain. Evaluate for appendicitis. EXAM: CT ABDOMEN AND PELVIS WITHOUT CONTRAST TECHNIQUE: Multidetector CT imaging of the abdomen and pelvis was performed following the standard protocol without IV contrast. COMPARISON:  CT scan October 31, 2017 FINDINGS: Lower chest: Small bilateral pleural effusions are identified. There may be a loculated component of pleural fluid anteriorly on the right. Three-vessel coronary artery disease identified. Mild cardiomegaly. The lung  bases are normal. Hepatobiliary: The liver is normal in appearance. The gallbladder appears to be thick walled with adjacent stranding. The gallbladder appears to contain high attenuation material. Pancreas: Unremarkable. No pancreatic ductal dilatation or surrounding inflammatory changes. Spleen: Normal in size without focal abnormality. Adrenals/Urinary Tract: The adrenal glands are unremarkable. The native kidneys are atrophic bilaterally. No hydronephrosis or acute perinephric stranding identified. Vascular calcifications are noted. No suspicious masses. No ureterectasis or stones associated with the native kidneys. A transplant kidney is seen in the right side of the pelvis with no significant adjacent fat stranding identified. There is mild prominence of the transplant kidneys pelvis, calices, and ureter without an identified stone. There is increased attenuation in the fat adjacent to the transplant kidneys renal pelvis. The bladder is unremarkable. Stomach/Bowel: The stomach and small bowel are normal. Scattered colonic diverticuli are seen without diverticulitis. The appendix is normal. Vascular/Lymphatic: Atherosclerotic change is seen in the nonaneurysmal aorta, iliac vessels, and femoral vessels. Reproductive: Uterus and bilateral adnexa are unremarkable. Other: Increased attenuation throughout the subcutaneous fat diffusely consistent with edema. A small amount of free fluid is seen in the pelvis. Musculoskeletal: No acute  or significant osseous findings. IMPRESSION: 1. The patient has a transplanted kidney in the right pelvis. There is mild associated pelvicaliectasis which is age indeterminate. There is mild increased attenuation in the fat adjacent to the right renal pelvis. The right ureter is prominent but no stones are seen. The findings are age indeterminate and of uncertain etiology. Recommend clinical correlation. 2. The gallbladder wall is thickened. The gallbladder contains high attenuation  material. Fat stranding adjacent to the gallbladder. Recommend ultrasound for better evaluation. 3. The appendix is normal in appearance. 4. Bilateral pleural effusions. 5. Diffuse soft tissue edema.  Mild ascites in the pelvis. 6. Atherosclerotic change in the nonaneurysmal aorta, iliac vessels, and femoral vessels. Coronary artery disease. 7. Scattered colonic diverticuli without diverticulitis. Electronically Signed   By: Dorise Bullion III M.D.   On: 11/01/2020 14:56    Procedures Procedures   Medications Ordered in ED Medications  ondansetron (ZOFRAN) injection 4 mg (has no administration in time range)    ED Course  I have reviewed the triage vital signs and the nursing notes.  Pertinent labs & imaging results that were available during my care of the patient were reviewed by me and considered in my medical decision making (see chart for details).    MDM Rules/Calculators/A&P                           Alert 65 year old female no acute distress, nontoxic-appearing.  Presents to ED with chief complaint of constipation, dark stool, and abdominal discomfort.  On physical exam abdomen soft, nondistended, tenderness to right lower quadrant and periumbilical area.  No guarding or rebound tenderness.  Minimal amount of light brown stool noted in rectal vault.  No melena or frank red blood.  No impaction.  Abdominal lab work-up ordered.  Due to patient's abdominal tenderness will obtain CT scan to evaluate for possible intra-abdominal infection.    CMP shows elevated creatinine and BUN at 4.73 and 85 respectively.  This is increased from lab work obtained 2 weeks prior. CBC shows anemia normal cytopenia which appears baseline when compared to labs obtained 2 weeks prior. Lipase within normal limits Hemoccult positive  CT abdomen pelvis shows Mild increased attenuation in the fat adjacent to the right renal pelvis.  Gallbladder wall is thickened, fascinating adjacent to the  gallbladder.  Serial repeat examination abdomen remains soft, nondistended, tenderness to right lower quadrant.  Patient, complains of continued nausea, appears to spit up clear fluid.  We will give patient Zofran.  Due to CT scan findings concerning for possible pyelonephritis of transplanted kidney.  Urinalysis pending at this time.  Due to patient's AKI and possible pyelonephritis likely will need admission.  Patient care transferred to [place] at the end of my shift. Patient presentation, ED course, and plan of care discussed with review of all pertinent labs and imaging. Please see his/her note for further details regarding further ED course and disposition.    Final Clinical Impression(s) / ED Diagnoses Final diagnoses:  Right upper quadrant pain    Rx / DC Orders ED Discharge Orders     None        Loni Beckwith, PA-C 11/01/20 1609    Pattricia Boss, MD 11/02/20 1511

## 2020-11-01 NOTE — ED Notes (Signed)
Pt transported to [facility]

## 2020-11-01 NOTE — ED Provider Notes (Signed)
Care assumed from Debbe Mounts at shift change, please see his note for full details, but in brief Jenna [name] is a 65 y.o. female with a history of renal transplant who presents with abdominal pain and constipation, 2 days ago noted bright red blood in her stool, none since then.  Work-up has been significant for worsening of renal function and CT with some stranding around the transplanted kidney, concern for potential pyelonephritis, urinalysis pending.  Patient also with positive Hemoccult, but hemoglobin improved from baseline.  Plan: Follow-up on urinalysis and right upper quadrant ultrasound which was ordered due to gallbladder wall thickening noted on CT but without right upper quadrant tenderness.  Anticipate admission  BP 136/78   Pulse (!) 50   Temp 98.8 F (37.1 C) (Oral)   Resp 14   Ht $R'5\' 7"'vI$  (1.702 m)   Wt 90 kg   SpO2 98%   BMI 31.09 kg/m     ED Course/Procedures   Labs Reviewed  COMPREHENSIVE METABOLIC PANEL - Abnormal; Notable for the following components:      Result Value   Sodium 131 (*)    CO2 19 (*)    Glucose, Bld 101 (*)    BUN 85 (*)    Creatinine, Ser 4.73 (*)    Albumin 3.2 (*)    GFR, Estimated 10 (*)    All other components within normal limits  CBC - Abnormal; Notable for the following components:   WBC 3.5 (*)    RBC 3.59 (*)    Hemoglobin 10.6 (*)    HCT 32.6 (*)    RDW 22.2 (*)    Platelets 96 (*)    nRBC 0.6 (*)    All other components within normal limits  URINALYSIS, ROUTINE W REFLEX MICROSCOPIC - Abnormal; Notable for the following components:   APPearance CLOUDY (*)    Protein, ur 100 (*)    Leukocytes,Ua MODERATE (*)    All other components within normal limits  URINALYSIS, MICROSCOPIC (REFLEX) - Abnormal; Notable for the following components:   Bacteria, UA MANY (*)    All other components within normal limits  POC OCCULT BLOOD, ED - Abnormal; Notable for the following components:   Fecal Occult Bld POSITIVE (*)    All  other components within normal limits  URINE CULTURE  SARS CORONAVIRUS 2 (TAT 6-24 HRS)  LIPASE, BLOOD  MAGNESIUM  PHOSPHORUS  BASIC METABOLIC PANEL  CBC  TACROLIMUS LEVEL   CT ABDOMEN PELVIS WO CONTRAST  Result Date: 11/01/2020 CLINICAL DATA:  Right lower quadrant pain. Evaluate for appendicitis. EXAM: CT ABDOMEN AND PELVIS WITHOUT CONTRAST TECHNIQUE: Multidetector CT imaging of the abdomen and pelvis was performed following the standard protocol without IV contrast. COMPARISON:  CT scan October 31, 2017 FINDINGS: Lower chest: Small bilateral pleural effusions are identified. There may be a loculated component of pleural fluid anteriorly on the right. Three-vessel coronary artery disease identified. Mild cardiomegaly. The lung bases are normal. Hepatobiliary: The liver is normal in appearance. The gallbladder appears to be thick walled with adjacent stranding. The gallbladder appears to contain high attenuation material. Pancreas: Unremarkable. No pancreatic ductal dilatation or surrounding inflammatory changes. Spleen: Normal in size without focal abnormality. Adrenals/Urinary Tract: The adrenal glands are unremarkable. The native kidneys are atrophic bilaterally. No hydronephrosis or acute perinephric stranding identified. Vascular calcifications are noted. No suspicious masses. No ureterectasis or stones associated with the native kidneys. A transplant kidney is seen in the right side of the pelvis with no significant  adjacent fat stranding identified. There is mild prominence of the transplant kidneys pelvis, calices, and ureter without an identified stone. There is increased attenuation in the fat adjacent to the transplant kidneys renal pelvis. The bladder is unremarkable. Stomach/Bowel: The stomach and small bowel are normal. Scattered colonic diverticuli are seen without diverticulitis. The appendix is normal. Vascular/Lymphatic: Atherosclerotic change is seen in the nonaneurysmal aorta, iliac  vessels, and femoral vessels. Reproductive: Uterus and bilateral adnexa are unremarkable. Other: Increased attenuation throughout the subcutaneous fat diffusely consistent with edema. A small amount of free fluid is seen in the pelvis. Musculoskeletal: No acute or significant osseous findings. IMPRESSION: 1. The patient has a transplanted kidney in the right pelvis. There is mild associated pelvicaliectasis which is age indeterminate. There is mild increased attenuation in the fat adjacent to the right renal pelvis. The right ureter is prominent but no stones are seen. The findings are age indeterminate and of uncertain etiology. Recommend clinical correlation. 2. The gallbladder wall is thickened. The gallbladder contains high attenuation material. Fat stranding adjacent to the gallbladder. Recommend ultrasound for better evaluation. 3. The appendix is normal in appearance. 4. Bilateral pleural effusions. 5. Diffuse soft tissue edema.  Mild ascites in the pelvis. 6. Atherosclerotic change in the nonaneurysmal aorta, iliac vessels, and femoral vessels. Coronary artery disease. 7. Scattered colonic diverticuli without diverticulitis. Electronically Signed   By: Dorise Bullion III M.D.   On: 11/01/2020 14:56   DG Chest Portable 1 View  Result Date: 11/01/2020 CLINICAL DATA:  Pleural effusion EXAM: PORTABLE CHEST 1 VIEW COMPARISON:  10/15/2020, same day CT abdomen pelvis, 08/31/2020 FINDINGS: Gross cardiomegaly. Small left pleural effusion and associated atelectasis or consolidation, not significant changed in appearance compared to prior radiographs dated 08/31. The visualized skeletal structures are unremarkable. IMPRESSION: 1.  Gross cardiomegaly. 2. Small left pleural effusion and associated atelectasis or consolidation, not significantly changed in appearance compared to prior radiographs dated 08/31. Electronically Signed   By: Eddie Candle M.D.   On: 11/01/2020 15:40   US Abdomen Limited RUQ  (LIVER/GB)  Result Date: 11/01/2020 CLINICAL DATA:  Abnormality seen on CT imaging. Right upper quadrant pain. EXAM: ULTRASOUND ABDOMEN LIMITED RIGHT UPPER QUADRANT COMPARISON:  CT scan November 01, 2020 FINDINGS: Gallbladder: Gallbladder wall is thickened to 1.1 cm. No stones, sludge, or Murphy's sign. Common bile duct: Diameter: The common bile duct measures 2 mm on limited imaging/visualization. Liver: A hyperechoic mass is seen in the liver measuring 1.3 x 1.1 x 1.0 cm. No other abnormalities. Portal vein is patent on color Doppler imaging with normal direction of blood flow towards the liver. Other: None. IMPRESSION: 1. The gallbladder wall is thickened to 1.1 cm. No stones, sludge, or Murphy's sign. The gallbladder wall thickening is a nonspecific finding given the absence of other findings. If the clinical picture remains ambiguous, a HIDA scan could further evaluate. 2. Evaluation of the common bile duct is limited but the visualized portion is normal in caliber. 3. The hyperechoic mass in the liver measuring up to 1.3 cm is nonspecific but most likely a hemangioma. This mass is somewhat irregular in shape. Recommend either a CT scan with contrast and delayed imaging for confirmation or an MRI. Electronically Signed   By: Dorise Bullion III M.D.   On: 11/01/2020 16:38     Procedures  MDM   UA consistent with infection and patient with stranding around her transplanted kidney on CT.  She did have some gallbladder wall thickening, which is also seen  on ultrasound but she has no pain here, normal LFTs and lipase, suspect this is incidental, could be followed up with HIDA scan if needed.  Patient started on IV Rocephin for pyelonephritis.  Will require admission for IV antibiotics given transplant status with AKI and immunosuppression.  Will consult medicine for admission  Case discussed with Dr. Zada Finders with [facility], request nephrology be consulted to determine if patient is  appropriate to stay here or if she will need to be transferred to [facility] with her transplant team.  Case discussed with Dr. Jonnie Finner with nephrology, feels patient is appropriate to remain here given that her renal function is only minimally worsened.  Dr. Posey Pronto with [facility] will see and admit the patient.       Jacqlyn Larsen, PA-C 11/01/20 [id number]    Jeanell Sparrow, DO 11/02/20 1529

## 2020-11-01 NOTE — H&P (Signed)
Acute History and Physical    Jenna [name] OHY:[id number] DOB: 01/03/1956 DOA: 11/01/2020  PCP: [name], Betty G, MD  Patient coming from: Home  I have personally briefly reviewed patient's old medical records in [place]  Chief Complaint: Dark stool, abdominal discomfort  HPI: Jenna [name] is a 65 y.o. 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  Patient reports seeing dark black color stool beginning about 1 week ago.  She did see some bright red blood in her stool 2 days ago but otherwise has been black and soft in consistency.  She has not seen any other obvious bleeding.  She takes aspirin 81 mg daily.  She was previously on Eliquis for history of atrial fibrillation but has not been on this recently for unclear reasons (review of previous notes indicate recommendations to stay on Eliquis prior to this admission).  She has been having some lower abdominal discomfort.  She says that she still does make urine but has decreased output than expected.  She denies any dysuria.  She reports chronic swelling of her legs which is unchanged.  She denies any chest pain, dyspnea, cough, fevers, chills, diaphoresis.  ED Course:  Initial vitals showed BP 110/70, pulse 57, RR 16, temp 98.8 F, SPO2 97% on room air.  Labs significant for BUN 85, creatinine 4.73, GFR 10 (last creatinine 3.33 with GFR 15 on 10/15/2020), sodium 131, potassium 5.0, bicarb 19, serum glucose 101, LFTs within normal limits, WBC 3.5, hemoglobin 10.6, platelets 96,000, lipase 26.  FOBT is positive. Urinalysis  shows 100 protein, negative nitrites, moderate leukocytes, 0-5 RBC/hpf, >50 WBC/hpf, many bacteria on microscopy.  Urine culture collected and pending.  CT abdomen/pelvis without contrast shows transplanted kidney in the right pelvis with mild associated pelviectasis which is age-indeterminate.  Mild increased attenuation in the fat adjacent to the right renal pelvis noted.  Right ureter is prominent without evidence of stones.  Gallbladder wall is thickened.  Appendix appears normal.  Bilateral pleural effusions, diffuse soft tissue edema, mild ascites in the pelvis noted.  Atherosclerotic change in the nonaneurysmal aorta, iliac vessels, and femoral vessels noted as well as CAD.  Scattered colonic diverticuli without diverticulitis reported.  Portable chest x-ray shows gross cardiomegaly with stable small left pleural effusion associated atelectasis.  RUQ abdominal ultrasound shows gallbladder wall thickened to 1.1 cm without evidence of stones, sludge, Murphy sign.  Limited CBD evaluation is normal in caliber.  Hyperechoic mass in the liver measuring up to 1.3 cm is seen.  Patient was given 500 cc normal saline, IV ceftriaxone, IV fentanyl 50 mcg x 2, IV Zofran, IV Protonix 80 mg followed by continuous infusion.  EDP discussed with on-call nephrology who felt patient was stable to remain at [facility] from a renal perspective.  The hospitalist service was consulted to admit for further evaluation and management.  Review of Systems: All systems reviewed and are negative except as documented in history of present illness above.   Past Medical History:  Diagnosis Date   Anemia    Anxiety    panic attack- talks herself and takes deep breathes   CAD (coronary artery  disease)    a. STEMI 10/2018 s/p DES to LAD.   Depression    Dyspnea    with exertion    ESRD (end stage renal disease) ([place])    hemo TTHSAT   Essential hypertension    FSGS (focal segmental glomerulosclerosis) 2011   By renal  biopsy   Head injury    age 50   HFrEF (heart failure with reduced ejection fraction) ([place])    History of kidney stones    kidney stone   Hx of lupus nephritis 2011   by renal biopsy   Ischemic cardiomyopathy    Kidney transplant recipient    LA thrombus 10/2018   Lupus (systemic lupus erythematosus) ([place])    followed by Dr. 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 remote parietal infarct on brain MRI 12/2019.   Pericarditis    age 1ish   Renal stone    Secondary hyperparathyroidism of renal origin ([place])    SLE (systemic lupus erythematosus) ([place])    TIA (transient ischemic attack)    no residual effects    Past Surgical History:  Procedure Laterality Date   AV FISTULA PLACEMENT Left 07/04/2017   Procedure: ARTERIOVENOUS (AV) FISTULA CREATION BRACHIOCEPHALIC;  Surgeon: [name], MD;  Location: [facility] OR;  Service: Vascular;  Laterality: Left;   COLONOSCOPY W/ POLYPECTOMY     IR FLUORO GUIDE CV LINE RIGHT  06/28/2017   IR US GUIDE VASC ACCESS RIGHT  06/28/2017   KIDNEY SURGERY     kidney transplant 2020   REVISION OF ARTERIOVENOUS GORETEX GRAFT Left 11/30/2017   Procedure: REVISION OF ARTERIOVENOUS FISTULA LEFT ARM Superfistulization and branch ligation.;  Surgeon: Waynetta Sandy, MD;  Location: [facility];  Service: Vascular;  Laterality: Left;    Social History:  reports that she has never smoked. She has been exposed to tobacco smoke. She has never used smokeless tobacco. She reports that she does not drink alcohol and does not use drugs.  Allergies  Allergen Reactions   Enalapril Maleate Anaphylaxis, Swelling and Other (See Comments)    Throat swells   Penicillins Other (See Comments)    Made patient lightheaded PATIENT HAS HAD A PCN REACTION WITH IMMEDIATE RASH, FACIAL/TONGUE/THROAT SWELLING, SOB, OR LIGHTHEADEDNESS WITH HYPOTENSION:  #  #  YES  #  #  Has patient had a  PCN reaction causing severe rash involving mucus membranes or skin necrosis: No Has patient had a PCN reaction that required hospitalization: No Has patient had a PCN reaction occurring within the last 10 years: No If all of the above answers are "NO", then may proceed with Cephalosporin use.    Chocolate Nausea And Vomiting   Tape Itching, Rash and Other (See Comments)    Family History  Problem Relation Age of Onset   Diabetes Mother    Hypertension Father    Cancer Sister    Hypertension Brother      Prior to Admission medications   Medication Sig Start Date End Date Taking? Authorizing Provider  acetaminophen (TYLENOL) 500 MG tablet Take 1,000 mg by mouth every 6 (six) hours as needed for headache.    [provider]  ALPRAZolam Duanne Moron) 0.25 MG tablet Take 1 tablet (0.25 mg total) by mouth at bedtime as needed for anxiety. 10/29/20   [name], Betty G, MD  aspirin 81 MG EC tablet Take 81 mg by  mouth every morning. 11/21/19   [provider]  citalopram (CELEXA) 10 MG tablet Take 1 tablet (10 mg total) by mouth daily. 10/29/20   [name], Betty G, MD  gabapentin (NEURONTIN) 100 MG capsule Take 2 capsules (200 mg total) by mouth daily. 10/13/20   [name], Betty G, MD  hydroxychloroquine (PLAQUENIL) 200 MG tablet Take 1 tablet (200 mg total) by mouth 2 (two) times daily. 12/25/13   Tresa Garter, MD  isosorbide-hydrALAZINE (BIDIL) 20-37.5 MG tablet Take 1 tablet by mouth 3 (three) times daily. 08/26/20   Debbe Odea, MD  lidocaine (LIDODERM) 5 % Place 1 patch onto the skin daily. Remove & Discard patch within 12 hours or as directed by MD    [provider]  metoprolol succinate (TOPROL-XL) 25 MG 24 hr tablet Take 1 tablet (25 mg total) by mouth daily. 10/29/20   [name], Betty G, MD  nitroGLYCERIN (NITROSTAT) 0.4 MG SL tablet Place 0.4 mg under the tongue every 5 (five) minutes as needed for chest pain.     [provider]  pantoprazole (PROTONIX) 40  MG tablet Take 40 mg by mouth daily. 08/20/20   [provider]  predniSONE (DELTASONE) 5 MG tablet Take 5 mg by mouth daily with lunch. 05/16/19   [provider]  rosuvastatin (CRESTOR) 5 MG tablet Take 5 mg by mouth daily at 6 PM. 05/02/19   [provider]  simethicone (MYLICON) 80 MG chewable tablet Chew 80 mg by mouth 4 (four) times daily as needed for flatulence.    [provider]  tacrolimus (PROGRAF) 0.5 MG capsule Take 0.5-1 mg by mouth See admin instructions. Take 1 capsule (0.5 mg) by mouth every morning and 2 capsules (1 mg) at night    [provider]  torsemide (DEMADEX) 20 MG tablet Take 20 mg by mouth in the morning, at noon, and at bedtime.    [provider]  valGANciclovir (VALCYTE) 450 MG tablet Take 450 mg by mouth every other day.    [provider]    Physical Exam: Vitals:   11/01/20 2045 11/01/20 2100 11/01/20 2200 11/01/20 2215  BP: 138/82 (!) 125/93 132/85 136/78  Pulse: (!) 48 (!) 49 (!) 51 (!) 50  Resp: $Remo'12 12 15 14  'pmyjJ$ Temp:      TempSrc:      SpO2: 97% 97% 99% 98%  Weight:      Height:       Constitutional: Resting in bed in the right lateral decubitus position, appears tired but in NAD, calm Eyes: PERRL, lids and conjunctivae normal ENMT: Mucous membranes are moist. Posterior pharynx clear of any exudate or lesions. Neck: normal, supple, no masses. Respiratory: clear to auscultation bilaterally, no wheezing, no crackles. Normal respiratory effort. No accessory muscle use.  Cardiovascular: Sinus bradycardia, flow murmur heard throughout.  Nonpitting bilateral lower extremity edema present. 2+ pedal pulses. Abdomen: Mild lower abdominal tenderness, no masses palpated. No hepatosplenomegaly. Musculoskeletal: no clubbing / cyanosis. No joint deformity upper and lower extremities. Good ROM, no contractures. Normal muscle tone.  Skin: no rashes, lesions, ulcers. No induration Neurologic: CN 2-12 grossly  intact. Sensation intact. Strength 5/5 in all 4.  Psychiatric: Normal judgment and insight. Alert and oriented x 3. Normal mood.   Labs on Admission: I have personally reviewed following labs and imaging studies  CBC: Recent Labs  Lab 11/01/20 1200  WBC 3.5*  HGB 10.6*  HCT 32.6*  MCV 90.8  PLT 96*   Basic Metabolic Panel:  Recent Labs  Lab 11/01/20 1200  NA 131*  K 5.0  CL 101  CO2 19*  GLUCOSE 101*  BUN 85*  CREATININE 4.73*  CALCIUM 9.5   GFR: Estimated Creatinine Clearance: 13.7 mL/min (A) (by C-G formula based on SCr of 4.73 mg/dL (H)). Liver Function Tests: Recent Labs  Lab 11/01/20 1200  AST 31  ALT 33  ALKPHOS 115  BILITOT 1.1  PROT 6.7  ALBUMIN 3.2*   Recent Labs  Lab 11/01/20 1200  LIPASE 26   No results for input(s): AMMONIA in the last 168 hours. Coagulation Profile: No results for input(s): INR, PROTIME in the last 168 hours. Cardiac Enzymes: No results for input(s): CKTOTAL, CKMB, CKMBINDEX, TROPONINI in the last 168 hours. BNP (last 3 results) No results for input(s): PROBNP in the last 8760 hours. HbA1C: No results for input(s): HGBA1C in the last 72 hours. CBG: No results for input(s): GLUCAP in the last 168 hours. Lipid Profile: No results for input(s): CHOL, HDL, LDLCALC, TRIG, CHOLHDL, LDLDIRECT in the last 72 hours. Thyroid Function Tests: No results for input(s): TSH, T4TOTAL, FREET4, T3FREE, THYROIDAB in the last 72 hours. Anemia Panel: No results for input(s): VITAMINB12, FOLATE, FERRITIN, TIBC, IRON, RETICCTPCT in the last 72 hours. Urine analysis:    Component Value Date/Time   COLORURINE YELLOW 11/01/2020 2112   APPEARANCEUR CLOUDY (A) 11/01/2020 2112   LABSPEC 1.025 11/01/2020 2112   PHURINE 6.0 11/01/2020 2112   GLUCOSEU NEGATIVE 11/01/2020 2112   HGBUR NEGATIVE 11/01/2020 2112   BILIRUBINUR NEGATIVE 11/01/2020 2112   BILIRUBINUR neg 12/19/2014 [place] 11/01/2020 2112   PROTEINUR 100 (A)  11/01/2020 2112   UROBILINOGEN 0.2 12/19/2014 1157   UROBILINOGEN 0.2 05/23/2014 1301   NITRITE NEGATIVE 11/01/2020 2112   LEUKOCYTESUR MODERATE (A) 11/01/2020 2112    Radiological Exams on Admission: CT ABDOMEN PELVIS WO CONTRAST  Result Date: 11/01/2020 CLINICAL DATA:  Right lower quadrant pain. Evaluate for appendicitis. EXAM: CT ABDOMEN AND PELVIS WITHOUT CONTRAST TECHNIQUE: Multidetector CT imaging of the abdomen and pelvis was performed following the standard protocol without IV contrast. COMPARISON:  CT scan October 31, 2017 FINDINGS: Lower chest: Small bilateral pleural effusions are identified. There may be a loculated component of pleural fluid anteriorly on the right. Three-vessel coronary artery disease identified. Mild cardiomegaly. The lung bases are normal. Hepatobiliary: The liver is normal in appearance. The gallbladder appears to be thick walled with adjacent stranding. The gallbladder appears to contain high attenuation material. Pancreas: Unremarkable. No pancreatic ductal dilatation or surrounding inflammatory changes. Spleen: Normal in size without focal abnormality. Adrenals/Urinary Tract: The adrenal glands are unremarkable. The native kidneys are atrophic bilaterally. No hydronephrosis or acute perinephric stranding identified. Vascular calcifications are noted. No suspicious masses. No ureterectasis or stones associated with the native kidneys. A transplant kidney is seen in the right side of the pelvis with no significant adjacent fat stranding identified. There is mild prominence of the transplant kidneys pelvis, calices, and ureter without an identified stone. There is increased attenuation in the fat adjacent to the transplant kidneys renal pelvis. The bladder is unremarkable. Stomach/Bowel: The stomach and small bowel are normal. Scattered colonic diverticuli are seen without diverticulitis. The appendix is normal. Vascular/Lymphatic: Atherosclerotic change is seen in the  nonaneurysmal aorta, iliac vessels, and femoral vessels. Reproductive: Uterus and bilateral adnexa are unremarkable. Other: Increased attenuation throughout the subcutaneous fat diffusely consistent with edema. A small amount of free fluid is seen in the pelvis. Musculoskeletal: No acute or  significant osseous findings. IMPRESSION: 1. The patient has a transplanted kidney in the right pelvis. There is mild associated pelvicaliectasis which is age indeterminate. There is mild increased attenuation in the fat adjacent to the right renal pelvis. The right ureter is prominent but no stones are seen. The findings are age indeterminate and of uncertain etiology. Recommend clinical correlation. 2. The gallbladder wall is thickened. The gallbladder contains high attenuation material. Fat stranding adjacent to the gallbladder. Recommend ultrasound for better evaluation. 3. The appendix is normal in appearance. 4. Bilateral pleural effusions. 5. Diffuse soft tissue edema.  Mild ascites in the pelvis. 6. Atherosclerotic change in the nonaneurysmal aorta, iliac vessels, and femoral vessels. Coronary artery disease. 7. Scattered colonic diverticuli without diverticulitis. Electronically Signed   By: Dorise Bullion III M.D.   On: 11/01/2020 14:56   DG Chest Portable 1 View  Result Date: 11/01/2020 CLINICAL DATA:  Pleural effusion EXAM: PORTABLE CHEST 1 VIEW COMPARISON:  10/15/2020, same day CT abdomen pelvis, 08/31/2020 FINDINGS: Gross cardiomegaly. Small left pleural effusion and associated atelectasis or consolidation, not significant changed in appearance compared to prior radiographs dated 08/31. The visualized skeletal structures are unremarkable. IMPRESSION: 1.  Gross cardiomegaly. 2. Small left pleural effusion and associated atelectasis or consolidation, not significantly changed in appearance compared to prior radiographs dated 08/31. Electronically Signed   By: Eddie Candle M.D.   On: 11/01/2020 15:40   US  Abdomen Limited RUQ (LIVER/GB)  Result Date: 11/01/2020 CLINICAL DATA:  Abnormality seen on CT imaging. Right upper quadrant pain. EXAM: ULTRASOUND ABDOMEN LIMITED RIGHT UPPER QUADRANT COMPARISON:  CT scan November 01, 2020 FINDINGS: Gallbladder: Gallbladder wall is thickened to 1.1 cm. No stones, sludge, or Murphy's sign. Common bile duct: Diameter: The common bile duct measures 2 mm on limited imaging/visualization. Liver: A hyperechoic mass is seen in the liver measuring 1.3 x 1.1 x 1.0 cm. No other abnormalities. Portal vein is patent on color Doppler imaging with normal direction of blood flow towards the liver. Other: None. IMPRESSION: 1. The gallbladder wall is thickened to 1.1 cm. No stones, sludge, or Murphy's sign. The gallbladder wall thickening is a nonspecific finding given the absence of other findings. If the clinical picture remains ambiguous, a HIDA scan could further evaluate. 2. Evaluation of the common bile duct is limited but the visualized portion is normal in caliber. 3. The hyperechoic mass in the liver measuring up to 1.3 cm is nonspecific but most likely a hemangioma. This mass is somewhat irregular in shape. Recommend either a CT scan with contrast and delayed imaging for confirmation or an MRI. Electronically Signed   By: Dorise Bullion III M.D.   On: 11/01/2020 16:38    EKG: Personally reviewed. Sinus bradycardia with nonspecific IVCD.  Not significantly changed when compared to prior.  Assessment/Plan Principal Problem:   Acute renal failure superimposed on stage 4 chronic kidney disease ([name]) Active Problems:   Lupus (systemic lupus erythematosus) ([name])   Hypertension with renal disease   Dyslipidemia   HFrEF (heart failure with reduced ejection fraction) ([name])   CAD (coronary artery disease)   Paroxysmal atrial fibrillation ([name])   Anxiety disorder   Pyelonephritis of transplanted kidney   Thrombocytopenia ([place])   Liver mass   Jenna [name] is a 65 y.o.  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  Acute renal failure superimposed on CKD stage IV UTI/pyelonephritis of transplant kidney Lupus nephritis s/p DDKT 03/2018 on chronic immunosuppression: Creatinine 4.73 with GFR 10 on admission (baseline creatinine 3.0-3.3 and GFR ~15).  Given abnormal urinalysis and ultrasound findings, question UTI/pyelonephritis involving transplant kidney. -Continue empiric IV ceftriaxone -Follow urine cultures -Continue Plaquenil 200 mg daily, tacrolimus 0.5 mg a.m. and 1 mg p.m., prednisone 5 mg daily -Hold torsemide for now, monitor renal function -Strict I/O's  Dark/heme positive stool with mild anemia: Reports 1 week of black stools with one episode of red blood in stools 2 days prior to admission.  Started on IV PPI due to concern for upper GI bleed. -Continue IV Protonix infusion for now -Hold aspirin  HFrEF: EF 20-25%, [facility] cardiology who are considering ICD placement.  Has chronic edema with small pleural effusion but otherwise no respiratory symptoms or evidence of decompensated CHF. -Holding torsemide for now, follow strict I/O's -Holding BiDil and Toprol-XL due to normotension and bradycardia respectively  Paroxysmal atrial fibrillation with bradycardia: In sinus bradycardia on admission, rate maintaining 40-50.  Appears asymptomatic from the standpoint.  CHA2DS2-VASc score 7.  Previously on Eliquis, will not restart given potential upper GI bleed. Holding Toprol-XL given bradycardia.  CAD s/p STEMI and DES to LAD  10/2018: Denies any chest pain.  Continue rosuvastatin.  Aspirin, Toprol-XL on hold as above.  Thrombocytopenia: Mild, concern for potential upper GI bleed as above.  Hold aspirin and pharmacologic VTE prophylaxis.  Mild leukopenia: In the setting of chronic immunosuppression.  Continue to monitor.  Liver mass: Hyperechoic mass in the liver measuring up to 1.3 cm seen on ultrasound, nonspecific but most likely a hemangioma per radiology read.  Given irregular shape, follow-up imaging recommended per radiology.  Cannot receive IV contrast at this time given renal dysfunction.  Gallbladder wall thickening: Noted on RUQ ultrasound without evidence of stones, sludge, Murphy sign.  Visualized portion of CBD measures 2 mm.  No clear symptoms of cholecystitis, will continue to monitor.  History of CMV viremia: Continue Valcyte.  Anxiety: Continue Celexa.  DVT prophylaxis: SCDs Code Status: Full code, confirmed with patient Family Communication: Discussed with patient, she has discussed with family Disposition Plan: From home, dispo pending clinical progress Consults called: EDP discussed with on-call nephrology Level of care: Telemetry Cardiac Admission status:  Status is: Observation  The patient remains OBS appropriate and will d/c before 2 midnights.  Dispo: The patient is from: Home              Anticipated d/c is to: Home              Patient currently is not medically stable to d/c.   Zada Finders MD [facility]  If 7PM-7AM, please contact night-coverage [email or web address]  11/01/2020, 10:45 PM

## 2020-11-01 NOTE — ED Triage Notes (Signed)
Patient complains of dark stool and noticed blood in stool 2 days ago and reports mild abdominal discomfort. Reports constipation with same and has not used any otc meds

## 2020-11-01 NOTE — ED Notes (Signed)
Bladder scanner showed 250ml. Was only able to get 26ml out with in and out catheter. PA made aware.

## 2020-11-01 NOTE — ED Notes (Signed)
Pt back from US

## 2020-11-01 NOTE — ED Notes (Signed)
Pt also reporting not sleeping for 4 days.

## 2020-11-01 NOTE — ED Notes (Signed)
Attempted in and out cath of pt for urine specimen. Urine not obtained d/t no urine in bladder. Notified PA.

## 2020-11-01 NOTE — ED Notes (Signed)
Patient transported to CT 

## 2020-11-02 ENCOUNTER — Encounter (HOSPITAL_COMMUNITY): Payer: Self-pay | Admitting: Internal Medicine

## 2020-11-02 DIAGNOSIS — R0602 Shortness of breath: Secondary | ICD-10-CM | POA: Diagnosis not present

## 2020-11-02 DIAGNOSIS — N12 Tubulo-interstitial nephritis, not specified as acute or chronic: Secondary | ICD-10-CM | POA: Diagnosis present

## 2020-11-02 DIAGNOSIS — K921 Melena: Secondary | ICD-10-CM | POA: Diagnosis present

## 2020-11-02 DIAGNOSIS — E871 Hypo-osmolality and hyponatremia: Secondary | ICD-10-CM | POA: Diagnosis not present

## 2020-11-02 DIAGNOSIS — E669 Obesity, unspecified: Secondary | ICD-10-CM | POA: Diagnosis present

## 2020-11-02 DIAGNOSIS — Z20822 Contact with and (suspected) exposure to covid-19: Secondary | ICD-10-CM | POA: Diagnosis present

## 2020-11-02 DIAGNOSIS — I2510 Atherosclerotic heart disease of native coronary artery without angina pectoris: Secondary | ICD-10-CM | POA: Diagnosis present

## 2020-11-02 DIAGNOSIS — I129 Hypertensive chronic kidney disease with stage 1 through stage 4 chronic kidney disease, or unspecified chronic kidney disease: Secondary | ICD-10-CM | POA: Diagnosis not present

## 2020-11-02 DIAGNOSIS — D631 Anemia in chronic kidney disease: Secondary | ICD-10-CM | POA: Diagnosis present

## 2020-11-02 DIAGNOSIS — M3214 Glomerular disease in systemic lupus erythematosus: Secondary | ICD-10-CM | POA: Diagnosis present

## 2020-11-02 DIAGNOSIS — I5022 Chronic systolic (congestive) heart failure: Secondary | ICD-10-CM | POA: Diagnosis not present

## 2020-11-02 DIAGNOSIS — I130 Hypertensive heart and chronic kidney disease with heart failure and stage 1 through stage 4 chronic kidney disease, or unspecified chronic kidney disease: Secondary | ICD-10-CM | POA: Diagnosis present

## 2020-11-02 DIAGNOSIS — I5023 Acute on chronic systolic (congestive) heart failure: Secondary | ICD-10-CM | POA: Diagnosis present

## 2020-11-02 DIAGNOSIS — N179 Acute kidney failure, unspecified: Secondary | ICD-10-CM | POA: Diagnosis present

## 2020-11-02 DIAGNOSIS — Z6832 Body mass index (BMI) 32.0-32.9, adult: Secondary | ICD-10-CM | POA: Diagnosis not present

## 2020-11-02 DIAGNOSIS — Z955 Presence of coronary angioplasty implant and graft: Secondary | ICD-10-CM | POA: Diagnosis not present

## 2020-11-02 DIAGNOSIS — I480 Paroxysmal atrial fibrillation: Secondary | ICD-10-CM | POA: Diagnosis present

## 2020-11-02 DIAGNOSIS — E785 Hyperlipidemia, unspecified: Secondary | ICD-10-CM | POA: Diagnosis present

## 2020-11-02 DIAGNOSIS — F064 Anxiety disorder due to known physiological condition: Secondary | ICD-10-CM | POA: Diagnosis not present

## 2020-11-02 DIAGNOSIS — D84821 Immunodeficiency due to drugs: Secondary | ICD-10-CM | POA: Diagnosis present

## 2020-11-02 DIAGNOSIS — J9811 Atelectasis: Secondary | ICD-10-CM | POA: Diagnosis present

## 2020-11-02 DIAGNOSIS — Z8673 Personal history of transient ischemic attack (TIA), and cerebral infarction without residual deficits: Secondary | ICD-10-CM | POA: Diagnosis not present

## 2020-11-02 DIAGNOSIS — Y830 Surgical operation with transplant of whole organ as the cause of abnormal reaction of the patient, or of later complication, without mention of misadventure at the time of the procedure: Secondary | ICD-10-CM | POA: Diagnosis present

## 2020-11-02 DIAGNOSIS — R5381 Other malaise: Secondary | ICD-10-CM | POA: Diagnosis not present

## 2020-11-02 DIAGNOSIS — F32A Depression, unspecified: Secondary | ICD-10-CM | POA: Diagnosis present

## 2020-11-02 DIAGNOSIS — R1011 Right upper quadrant pain: Secondary | ICD-10-CM

## 2020-11-02 DIAGNOSIS — T8619 Other complication of kidney transplant: Secondary | ICD-10-CM | POA: Diagnosis present

## 2020-11-02 DIAGNOSIS — R195 Other fecal abnormalities: Secondary | ICD-10-CM | POA: Diagnosis not present

## 2020-11-02 DIAGNOSIS — D61818 Other pancytopenia: Secondary | ICD-10-CM | POA: Diagnosis present

## 2020-11-02 DIAGNOSIS — T8613 Kidney transplant infection: Secondary | ICD-10-CM | POA: Diagnosis present

## 2020-11-02 DIAGNOSIS — N184 Chronic kidney disease, stage 4 (severe): Secondary | ICD-10-CM | POA: Diagnosis present

## 2020-11-02 DIAGNOSIS — F419 Anxiety disorder, unspecified: Secondary | ICD-10-CM | POA: Diagnosis not present

## 2020-11-02 DIAGNOSIS — E872 Acidosis: Secondary | ICD-10-CM | POA: Diagnosis present

## 2020-11-02 DIAGNOSIS — Z7189 Other specified counseling: Secondary | ICD-10-CM | POA: Diagnosis not present

## 2020-11-02 DIAGNOSIS — I5020 Unspecified systolic (congestive) heart failure: Secondary | ICD-10-CM | POA: Diagnosis not present

## 2020-11-02 LAB — BASIC METABOLIC PANEL
Anion gap: 13 (ref 5–15)
BUN: 84 mg/dL — ABNORMAL HIGH (ref 8–23)
CO2: 20 mmol/L — ABNORMAL LOW (ref 22–32)
Calcium: 9.3 mg/dL (ref 8.9–10.3)
Chloride: 99 mmol/L (ref 98–111)
Creatinine, Ser: 4.65 mg/dL — ABNORMAL HIGH (ref 0.44–1.00)
GFR, Estimated: 10 mL/min — ABNORMAL LOW (ref >60)
Glucose, Bld: 72 mg/dL (ref 70–99)
Potassium: 5 mmol/L (ref 3.5–5.1)
Sodium: 132 mmol/L — ABNORMAL LOW (ref 135–145)

## 2020-11-02 LAB — CBC
HCT: 33.2 % — ABNORMAL LOW (ref 36.0–46.0)
Hemoglobin: 10.7 g/dL — ABNORMAL LOW (ref 12.0–15.0)
MCH: 29.9 pg (ref 26.0–34.0)
MCHC: 32.2 g/dL (ref 30.0–36.0)
MCV: 92.7 fL (ref 80.0–100.0)
Platelets: 99 10*3/uL — ABNORMAL LOW (ref 150–400)
RBC: 3.58 MIL/uL — ABNORMAL LOW (ref 3.87–5.11)
RDW: 22.5 % — ABNORMAL HIGH (ref 11.5–15.5)
WBC: 3.2 10*3/uL — ABNORMAL LOW (ref 4.0–10.5)
nRBC: 0 % (ref 0.0–0.2)

## 2020-11-02 LAB — CREATININE, URINE, RANDOM: Creatinine, Urine: 104.82 mg/dL

## 2020-11-02 LAB — PHOSPHORUS: Phosphorus: 5.5 mg/dL — ABNORMAL HIGH (ref 2.5–4.6)

## 2020-11-02 LAB — MAGNESIUM: Magnesium: 2.6 mg/dL — ABNORMAL HIGH (ref 1.7–2.4)

## 2020-11-02 LAB — SARS CORONAVIRUS 2 (TAT 6-24 HRS): SARS Coronavirus 2: NEGATIVE

## 2020-11-02 LAB — SODIUM, URINE, RANDOM: Sodium, Ur: 16 mmol/L

## 2020-11-02 MED ORDER — INFLUENZA VAC A&B SA ADJ QUAD 0.5 ML IM PRSY
0.5000 mL | PREFILLED_SYRINGE | INTRAMUSCULAR | Status: DC
  Filled 2020-11-02: qty 0.5

## 2020-11-02 MED ORDER — SODIUM CHLORIDE 0.9 % IV SOLN: INTRAVENOUS | Status: DC

## 2020-11-02 MED ORDER — SODIUM CHLORIDE 0.9 % IV BOLUS
250.0000 mL | Freq: Once | INTRAVENOUS | Status: AC
  Administered 2020-11-02: 250 mL via INTRAVENOUS

## 2020-11-02 NOTE — Consult Note (Addendum)
Referring Provider:  Triad [name]         Primary Care Physician:  [name], Betty G, MD Primary Gastroenterologist:  Jenna [name]  Reason for Consultation:    abdominal pain   / melena             ASSESSMENT / PLAN   # Epigastric pain and reported black stools on daly ASA. FOBT+ but hemoglobin is 10.7 ( at baseline) --Will hold off on EGD for now due to stable Hb and resolution of symptoms --Switch from PPI gtt to PPI BID --Follow up in GI clinic as an outpatient  # AKI on CKD. Nephrology has been consulted. She is s/p renal transplant in 2020. On chronic immunosuppression.   # UTI / pyelonephritis of transplanted kidney, ' --On empiric IV Rochepin --Urine cultures pending  # Thrombocytopenia, chronic.Related to immunosuppression medications?  Platelet 99,  slightly worse than her usual     # CAD s/p STEMI, DES Sept 2020.   # Heart failure with reduced EF of 20-25%.   # Liver lesion. RUQ US shows an irregular shaped hyperechoic mass in the liver measuring up to 1.3 cm, nonspecific but most likely a hemangioma. Lesion not mentioned on non-contrast CTAP --If patient is able to undergo MRI despite the patient's kidney function, then would recommend obtaining during this hospitalization   HISTORY OF PRESENT ILLNESS                                                                                                                         Chief Complaint:  abdominal pain and black stool  Jenna [name] is a 65 y.o. female with a past medical history significant for  ESRD s/p kidney transplant in 2020 now with CKD stage IV, CHF 20 to 25% by TTE on 08/19/2020, CAD s/p STEMI with DES to LAD on 9/20, PAF, anemia of chronic disease, hyperlipidemia, depression. See PMH for any additional medical history.    ED course:  [name] presented to ED yesterday with complaints of upper abdominal discomfort and black stools. She has been constipated but the stool she does pass is black. She tells  me the upper abdominal pain started yesterday but black stools have been ongoing for a week. No nausea or vomiting. No bismuth use. She used to take oral iron but none in quite some time and it never turned her stools dark color. She takes a daily ASA, no other NSAIDS. She has had a poor appetite for the last several days.    SIGNIFICANT DIAGNOTIC STUDIES   Non -contrast CTAP IMPRESSION: 1. The patient has a transplanted kidney in the right pelvis. There is mild associated pelvicaliectasis which is age indeterminate. There is mild increased attenuation in the fat adjacent to the right renal pelvis. The right ureter is prominent but no stones are seen. The findings are age indeterminate and of uncertain etiology. Recommend clinical correlation. 2. The gallbladder wall is thickened. The gallbladder contains high attenuation material. Fat  stranding adjacent to the gallbladder. Recommend ultrasound for better evaluation. 3. The appendix is normal in appearance. 4. Bilateral pleural effusions. 5. Diffuse soft tissue edema.  Mild ascites in the pelvis. 6. Atherosclerotic change in the nonaneurysmal aorta, iliac vessels, and femoral vessels. Coronary artery disease. 7. Scattered colonic diverticuli without diverticulitis.  RUQ US IMPRESSION: 1. The gallbladder wall is thickened to 1.1 cm. No stones, sludge, or Murphy's sign. The gallbladder wall thickening is a nonspecific finding given the absence of other findings. If the clinical picture remains ambiguous, a HIDA scan could further evaluate. 2. Evaluation of the common bile duct is limited but the visualized portion is normal in caliber. 3. The hyperechoic mass in the liver measuring up to 1.3 cm is nonspecific but most likely a hemangioma. This mass is somewhat irregular in shape. Recommend either a CT scan with contrast and delayed imaging for confirmation or an MRI   PREVIOUS ENDOSCOPIC EVALUATIONS    Says she had a colonoscopy "  years ago". Cannot remember where but seems to have been done in [place].  Past Medical History:  Diagnosis Date   Anemia    Anxiety    panic attack- talks herself and takes deep breathes   CAD (coronary artery disease)    a. STEMI 10/2018 s/p DES to LAD.   Depression    Dyspnea    with exertion    ESRD (end stage renal disease) ([place])    hemo TTHSAT   Essential hypertension    FSGS (focal segmental glomerulosclerosis) 2011   By renal biopsy   Head injury    age 35   HFrEF (heart failure with reduced ejection fraction) ([place])    History of kidney stones    kidney stone   Hx of lupus nephritis 2011   by renal biopsy   Ischemic cardiomyopathy    Kidney transplant recipient    LA thrombus 10/2018   Lupus (systemic lupus erythematosus) ([place])    followed by Dr. Amil Amen   Obesity    Orthostatic hypotension    PAF (paroxysmal atrial fibrillation) ([place])    Parietal lobe infarction ([place])    a. remote parietal infarct on brain MRI 12/2019.   Pericarditis    age 46ish   Renal stone    Secondary hyperparathyroidism of renal origin ([place])    SLE (systemic lupus erythematosus) ([place])    TIA (transient ischemic attack)    no residual effects    Past Surgical History:  Procedure Laterality Date   AV FISTULA PLACEMENT Left 07/04/2017   Procedure: ARTERIOVENOUS (AV) FISTULA CREATION BRACHIOCEPHALIC;  Surgeon: [name], MD;  Location: [facility];  Service: Vascular;  Laterality: Left;   COLONOSCOPY W/ POLYPECTOMY     IR FLUORO GUIDE CV LINE RIGHT  06/28/2017   IR US GUIDE VASC ACCESS RIGHT  06/28/2017   KIDNEY SURGERY     kidney transplant 2020   REVISION OF ARTERIOVENOUS GORETEX GRAFT Left 11/30/2017   Procedure: REVISION OF ARTERIOVENOUS FISTULA LEFT ARM Superfistulization and branch ligation.;  Surgeon: Waynetta Sandy, MD;  Location: [facility];  Service: Vascular;  Laterality: Left;    Prior to Admission medications   Medication Sig Start Date End Date Taking? Authorizing  Provider  acetaminophen (TYLENOL) 500 MG tablet Take 1,000 mg by mouth every 6 (six) hours as needed for headache.   Yes [provider]  ALPRAZolam (XANAX) 0.25 MG tablet Take 1 tablet (0.25 mg total) by mouth at bedtime as needed for anxiety. 10/29/20  Yes [name], Betty G, MD  aspirin 81 MG EC tablet Take 81 mg by mouth every morning. 11/21/19  Yes [provider]  citalopram (CELEXA) 10 MG tablet Take 1 tablet (10 mg total) by mouth daily. 10/29/20  Yes [name], Betty G, MD  gabapentin (NEURONTIN) 100 MG capsule Take 2 capsules (200 mg total) by mouth daily. Patient taking differently: Take 100 mg by mouth at bedtime. 10/13/20  Yes [name], Betty G, MD  hydroxychloroquine (PLAQUENIL) 200 MG tablet Take 1 tablet (200 mg total) by mouth 2 (two) times daily. Patient taking differently: Take 200 mg by mouth daily. 12/25/13  Yes Jegede, Olugbemiga E, MD  isosorbide-hydrALAZINE (BIDIL) 20-37.5 MG tablet Take 1 tablet by mouth 3 (three) times daily. 08/26/20  Yes Debbe Odea, MD  metoprolol succinate (TOPROL-XL) 25 MG 24 hr tablet Take 1 tablet (25 mg total) by mouth daily. 10/29/20  Yes [name], Betty G, MD  nitroGLYCERIN (NITROSTAT) 0.4 MG SL tablet Place 0.4 mg under the tongue every 5 (five) minutes as needed for chest pain.    Yes [provider]  predniSONE (DELTASONE) 5 MG tablet Take 5 mg by mouth daily with lunch. 05/16/19  Yes [provider]  rosuvastatin (CRESTOR) 5 MG tablet Take 5 mg by mouth daily at 6 PM. 05/02/19  Yes [provider]  simethicone (MYLICON) 80 MG chewable tablet Chew 80 mg by mouth 4 (four) times daily as needed for flatulence.   Yes [provider]  tacrolimus (PROGRAF) 0.5 MG capsule Take 0.5-1 mg by mouth See admin instructions. 1 capsule every morning  2 capsules at bedtime   Yes [provider]  torsemide (DEMADEX) 20 MG tablet Take 20 mg by mouth 2 (two) times daily.   Yes [provider]   valGANciclovir (VALCYTE) 450 MG tablet Take 450 mg by mouth every other day.   Yes [provider]    Current Facility-Administered Medications  Medication Dose Route Frequency Provider Last Rate Last Admin   acetaminophen (TYLENOL) tablet 650 mg  650 mg Oral Q6H PRN Lenore Cordia, MD   650 mg at 11/02/20 0242   Or   acetaminophen (TYLENOL) suppository 650 mg  650 mg Rectal Q6H PRN Lenore Cordia, MD       cefTRIAXone (ROCEPHIN) 1 [name] in sodium chloride 0.9 % 100 mL IVPB  1 [name] Intravenous QHS Zada Finders R, MD       citalopram (CELEXA) tablet 10 mg  10 mg Oral Daily Zada Finders R, MD   10 mg at 11/02/20 1118   gabapentin (NEURONTIN) capsule 100 mg  100 mg Oral QHS Zada Finders R, MD   100 mg at 11/01/20 2348   hydroxychloroquine (PLAQUENIL) tablet 200 mg  200 mg Oral Daily Zada Finders R, MD   200 mg at 11/02/20 1117   ondansetron (ZOFRAN) tablet 4 mg  4 mg Oral Q6H PRN Lenore Cordia, MD       Or   ondansetron (ZOFRAN) injection 4 mg  4 mg Intravenous Q6H PRN Lenore Cordia, MD       pantoprozole (PROTONIX) 80 mg /NS 100 mL infusion  8 mg/hr Intravenous Continuous Benedetto Goad N, PA-C 10 mL/hr at 11/02/20 0631 8 mg/hr at 11/02/20 0631   predniSONE (DELTASONE) tablet 5 mg  5 mg Oral Q lunch Zada Finders R, MD   5 mg at 11/02/20 1310   rosuvastatin (CRESTOR) tablet 5 mg  5 mg Oral q1800 Lenore Cordia, MD  sodium chloride flush (NS) 0.9 % injection 3 mL  3 mL Intravenous Q12H Zada Finders R, MD       tacrolimus (PROGRAF) capsule 0.5 mg  0.5 mg Oral Daily Zada Finders R, MD   0.5 mg at 11/02/20 1117   tacrolimus (PROGRAF) capsule 1 mg  1 mg Oral QHS Zada Finders R, MD   1 mg at 11/01/20 2348   [START ON 11/03/2020] valGANciclovir (VALCYTE) 450 MG tablet TABS 450 mg  450 mg Oral De Nurse, MD        Allergies as of 11/01/2020 - Review Complete 11/01/2020  Allergen Reaction Noted   Enalapril maleate Anaphylaxis, Swelling, and Other (See Comments)  09/19/2014   Penicillins Other (See Comments) 03/18/2011   Chocolate Nausea And Vomiting 08/10/2014   Tape Itching, Rash, and Other (See Comments) 08/02/2017    Family History  Problem Relation Age of Onset   Diabetes Mother    Hypertension Father    Cancer Sister    Hypertension Brother     Social History   Socioeconomic History   Marital status: Legally Separated    Spouse name: Not on file   Number of children: 1   Years of education: 12   Highest education level: 12th grade  Occupational History   Not on file  Tobacco Use   Smoking status: Never    Passive exposure: Past   Smokeless tobacco: Never  Vaping Use   Vaping Use: Never used  Substance and Sexual Activity   Alcohol use: No   Drug use: No   Sexual activity: Not Currently  Other Topics Concern   Not on file  Social History Narrative   Right Handed   Lives in a two story home   Daughter recently got married    Social Determinants of Health   Financial Resource Strain: Low Risk    Difficulty of Paying Living Expenses: Not very hard  Food Insecurity: No Food Insecurity   Worried About Charity fundraiser in the Last Year: Never true   [place] in the Last Year: Never true  Transportation Needs: No Transportation Needs   Lack of Transportation (Medical): No   Lack of Transportation (Non-Medical): No  Physical Activity: Sufficiently Active   Days of Exercise per Week: 5 days   Minutes of Exercise per Session: 30 min  Stress: Stress Concern Present   Feeling of Stress : To some extent  Social Connections: Moderately Isolated   Frequency of Communication with Friends and Family: More than three times a week   Frequency of Social Gatherings with Friends and Family: More than three times a week   Attends Religious Services: 1 to 4 times per year   Active Member of [employer] or Organizations: No   Attends Archivist Meetings: Never   Marital Status: Separated  Intimate Partner Violence: Not  At Risk   Fear of Current or Ex-Partner: No   Emotionally Abused: No   Physically Abused: No   Sexually Abused: No    Review of Systems: All systems reviewed and negative except where noted in HPI.   OBJECTIVE    Physical Exam: Vital signs in last 24 hours: Temp:  [98.3 F (36.8 C)] 98.3 F (36.8 C) (09/18 1319) Pulse Rate:  [42-57] 57 (09/18 1319) Resp:  [10-23] 14 (09/18 1319) BP: (105-147)/(57-93) 136/71 (09/18 1319) SpO2:  [93 %-100 %] 100 % (09/18 1319) Weight:  [91 kg] 91 kg (09/18 1354)   General:  Alert  female in NAD Psych:  Pleasant, cooperative. Normal mood and affect. Eyes:  Pupils equal, sclera clear, no icterus.   Conjunctiva pink. Ears:  Normal auditory acuity. Nose:  No deformity, discharge,  or lesions. Neck:  Supple; no masses Lungs:  Clear throughout to auscultation.   No wheezes, crackles, or rhonchi.  Heart:  Regular rate , bilateral LE edema.  Abdomen:  Soft, non-distended, tender in epigastrium with moderate palpation, BS active, no palp mass   Rectal:  Deferred  Msk:  Symmetrical without gross deformities. . Neurologic:  Alert and  oriented x4;  grossly normal neurologically. Skin:  Intact without significant lesions or rashes.   Scheduled inpatient medications  citalopram  10 mg Oral Daily   gabapentin  100 mg Oral QHS   hydroxychloroquine  200 mg Oral Daily   predniSONE  5 mg Oral Q lunch   rosuvastatin  5 mg Oral q1800   sodium chloride flush  3 mL Intravenous Q12H   tacrolimus  0.5 mg Oral Daily   tacrolimus  1 mg Oral QHS   [START ON 11/03/2020] valGANciclovir  450 mg Oral QODAY      Intake/Output from previous day: 09/17 0701 - 09/18 0700 In: 556.1 [I.V.:56.1; IV Piggyback:500] Out: -  Intake/Output this shift: No intake/output data recorded.   Lab Results: Recent Labs    11/01/20 1200 11/02/20 0632  WBC 3.5* 3.2*  HGB 10.6* 10.7*  HCT 32.6* 33.2*  PLT 96* 99*   BMET Recent Labs    11/01/20 1200 11/02/20 0632   NA 131* 132*  K 5.0 5.0  CL 101 99  CO2 19* 20*  GLUCOSE 101* 72  BUN 85* 84*  CREATININE 4.73* 4.65*  CALCIUM 9.5 9.3   LFT Recent Labs    11/01/20 1200  PROT 6.7  ALBUMIN 3.2*  AST 31  ALT 33  ALKPHOS 115  BILITOT 1.1   PT/INR No results for input(s): LABPROT, INR in the last 72 hours. Hepatitis Panel No results for input(s): HEPBSAG, HCVAB, HEPAIGM, HEPBIGM in the last 72 hours.   . CBC Latest Ref Rng & Units 11/02/2020 11/01/2020 10/15/2020  WBC 4.0 - 10.5 K/uL 3.2(L) 3.5(L) 2.9(L)  Hemoglobin 12.0 - 15.0 [name]/dL 10.7(L) 10.6(L) 10.3(L)  Hematocrit 36.0 - 46.0 % 33.2(L) 32.6(L) 33.4(L)  Platelets 150 - 400 K/uL 99(L) 96(L) 122(L)    . CMP Latest Ref Rng & Units 11/02/2020 11/01/2020 10/15/2020  Glucose 70 - 99 mg/dL 72 101(H) 93  BUN 8 - 23 mg/dL 84(H) 85(H) 56(H)  Creatinine 0.44 - 1.00 mg/dL 4.65(H) 4.73(H) 3.33(H)  Sodium 135 - 145 mmol/L 132(L) 131(L) 132(L)  Potassium 3.5 - 5.1 mmol/L 5.0 5.0 4.3  Chloride 98 - 111 mmol/L 99 101 96(L)  CO2 22 - 32 mmol/L 20(L) 19(L) 24  Calcium 8.9 - 10.3 mg/dL 9.3 9.5 9.6  Total Protein 6.5 - 8.1 [name]/dL - 6.7 6.7  Total Bilirubin 0.3 - 1.2 mg/dL - 1.1 1.1  Alkaline Phos 38 - 126 U/L - 115 118  AST 15 - 41 U/L - 31 36  ALT 0 - 44 U/L - 33 33   Tye Savoy, NP-C @  11/02/2020, 3:20 PM

## 2020-11-02 NOTE — Consult Note (Addendum)
Renal Service Consult Note Jenna [name] [facility]  Jenna [name] 11/02/2020 Sol Blazing, MD Requesting Physician: Dr Darrick Meigs  Reason for Consult: Renal failure/ renal transplant HPI: The patient is a 65 y.o. year-old w/ hx of SLE, CAD, ESRD, sp renal transplant April 2020, HTN, HFrEF, ICM, LA thrombus, obesity, PAF, CVA who presented on 9/17 to ED w/ c/o dark stools and abdominal pain. Is on asa 85m at home, used to be on eliquis but not recently taking. In ED BP's wnl, HR 57, RR 16, afeb, 97% on RA. Creat 4.73 (last Cr 3.3 on 10/15/20). UA mod LE >50 wbc, 0-5 rbc, ++bact. CT showed pelvic transplant kidney w/ mild assoc pelviectasis and some fat attenuation associated. R ureter w/o stones; also bilat pleural effusions. CXR showed CM w/ small L effusion. RUQ UKoreashowed thick GB wall, no stones or sludge, neg Murphy sign. Pt given 500 cc NS bolus, IV rocephin, PPI IV and pt admitted. Asked to see for renal failure.   Pt seen in room. [facility] Baseline creat was in the 2's , now is in the 3's per the patient. Pt is compliant w/ her medications. Taking prograf and prednisone, cellcept/ myfortic was dc'd previously. Last office visit w/ Dr KJohnney Ouwas about 2 wks ago, torsemide was ^'d from 2 bid to 3 qam and 2 qpm. She has LUA aVF.   Pt c/o poor po intake for last few weeks, losing weight. No hx UTI's. No voiding issues. +has been feeling "cold" but no chills or fevers. No vomiting. +abdominal pain as above. No dysuria or cloudy / odorous urine.    ROS - denies CP, no joint pain, no HA, no blurry vision, no rash, no diarrhea, no nausea/ vomiting, no dysuria, no difficulty voiding   Past Medical History  Past Medical History:  Diagnosis Date   Anemia    Anxiety    panic attack- talks herself and takes deep breathes   CAD (coronary artery disease)    a. STEMI 10/2018 s/p DES to LAD.   Depression    Dyspnea    with exertion    ESRD (end stage  renal disease) ([facility]    hemo TTHSAT   Essential hypertension    FSGS (focal segmental glomerulosclerosis) 2011   By renal biopsy   Head injury    age 89  HFrEF (heart failure with reduced ejection fraction) ([facility]    History of kidney stones    kidney stone   Hx of lupus nephritis 2011   by renal biopsy   Ischemic cardiomyopathy    Kidney transplant recipient    LA thrombus 10/2018   Lupus (systemic lupus erythematosus) ([facility]    followed by Dr. BAmil Amen  Obesity    Orthostatic hypotension    PAF (paroxysmal atrial fibrillation) ([facility]    Parietal lobe infarction ([facility]    a. remote parietal infarct on brain MRI 12/2019.   Pericarditis    age 89ish  Renal stone    Secondary hyperparathyroidism of renal origin ([facility]    SLE (systemic lupus erythematosus) ([name])    TIA (transient ischemic attack)    no residual effects   Past Surgical History  Past Surgical History:  Procedure Laterality Date   AV FISTULA PLACEMENT Left 07/04/2017   Procedure: ARTERIOVENOUS (AV) FISTULA CREATION BRACHIOCEPHALIC;  Surgeon: ERosetta Posner MD;  Location: [facility] OR;  Service: Vascular;  Laterality: Left;   COLONOSCOPY  W/ POLYPECTOMY     IR FLUORO GUIDE CV LINE RIGHT  06/28/2017   IR US GUIDE VASC ACCESS RIGHT  06/28/2017   KIDNEY SURGERY     kidney transplant 2020   REVISION OF ARTERIOVENOUS GORETEX GRAFT Left 11/30/2017   Procedure: REVISION OF ARTERIOVENOUS FISTULA LEFT ARM Superfistulization and branch ligation.;  Surgeon: Waynetta Sandy, MD;  Location: [facility] OR;  Service: Vascular;  Laterality: Left;   Family History  Family History  Problem Relation Age of Onset   Diabetes Mother    Hypertension Father    Cancer Sister    Hypertension Brother    Social History  reports that she has never smoked. She has been exposed to tobacco smoke. She has never used smokeless tobacco. She reports that she does not drink alcohol and does not use drugs. Allergies  Allergies  Allergen Reactions    Enalapril Maleate Anaphylaxis, Swelling and Other (See Comments)    Throat swells   Penicillins Other (See Comments)    Made patient lightheaded PATIENT HAS HAD A PCN REACTION WITH IMMEDIATE RASH, FACIAL/TONGUE/THROAT SWELLING, SOB, OR LIGHTHEADEDNESS WITH HYPOTENSION:  #  #  YES  #  #  Has patient had a PCN reaction causing severe rash involving mucus membranes or skin necrosis: No Has patient had a PCN reaction that required hospitalization: No Has patient had a PCN reaction occurring within the last 10 years: No If all of the above answers are "NO", then may proceed with Cephalosporin use.    Chocolate Nausea And Vomiting   Tape Itching, Rash and Other (See Comments)   Home medications Prior to Admission medications   Medication Sig Start Date End Date Taking? Authorizing Provider  acetaminophen (TYLENOL) 500 MG tablet Take 1,000 mg by mouth every 6 (six) hours as needed for headache.   Yes [provider]  ALPRAZolam (XANAX) 0.25 MG tablet Take 1 tablet (0.25 mg total) by mouth at bedtime as needed for anxiety. 10/29/20  Yes [name], Betty G, MD  aspirin 81 MG EC tablet Take 81 mg by mouth every morning. 11/21/19  Yes [provider]  citalopram (CELEXA) 10 MG tablet Take 1 tablet (10 mg total) by mouth daily. 10/29/20  Yes [name], Betty G, MD  gabapentin (NEURONTIN) 100 MG capsule Take 2 capsules (200 mg total) by mouth daily. Patient taking differently: Take 100 mg by mouth at bedtime. 10/13/20  Yes [name], Betty G, MD  hydroxychloroquine (PLAQUENIL) 200 MG tablet Take 1 tablet (200 mg total) by mouth 2 (two) times daily. Patient taking differently: Take 200 mg by mouth daily. 12/25/13  Yes Jegede, Olugbemiga E, MD  isosorbide-hydrALAZINE (BIDIL) 20-37.5 MG tablet Take 1 tablet by mouth 3 (three) times daily. 08/26/20  Yes Debbe Odea, MD  metoprolol succinate (TOPROL-XL) 25 MG 24 hr tablet Take 1 tablet (25 mg total) by mouth daily. 10/29/20  Yes [name], Betty G, MD   nitroGLYCERIN (NITROSTAT) 0.4 MG SL tablet Place 0.4 mg under the tongue every 5 (five) minutes as needed for chest pain.    Yes [provider]  predniSONE (DELTASONE) 5 MG tablet Take 5 mg by mouth daily with lunch. 05/16/19  Yes [provider]  rosuvastatin (CRESTOR) 5 MG tablet Take 5 mg by mouth daily at 6 PM. 05/02/19  Yes [provider]  simethicone (MYLICON) 80 MG chewable tablet Chew 80 mg by mouth 4 (four) times daily as needed for flatulence.   Yes [provider]  tacrolimus (PROGRAF) 0.5 MG capsule  Take 0.5-1 mg by mouth See admin instructions. 1 capsule every morning  2 capsules at bedtime   Yes [provider]  torsemide (DEMADEX) 20 MG tablet Take 20 mg by mouth 2 (two) times daily.   Yes [provider]  valGANciclovir (VALCYTE) 450 MG tablet Take 450 mg by mouth every other day.   Yes [provider]     Vitals:   11/02/20 0700 11/02/20 1000 11/02/20 1319 11/02/20 1354  BP: 127/70 133/71 136/71   Pulse: (!) 51 (!) 54 (!) 57   Resp: '18 15 14   ' Temp:   98.3 F (36.8 C)   TempSrc:   Oral   SpO2: 97% 100% 100%   Weight:    91 kg  Height:    '5\' 7"'  (1.702 m)   Exam Gen alert, no distress, obese No rash, cyanosis or gangrene Sclera anicteric, throat clear, slightly dry mouth No jvd or bruits Chest clear bilat to bases, no rales/ wheezing RRR no MRG Abd soft ntnd no mass or ascites +bs GU defer MS no joint effusions or deformity Ext 1-2+ ankle/ pretib edema bilat, no wounds or ulcers Neuro is alert, Ox 3 , nf, no asterixis LUA AVF + bruit     Home meds include - celexa, neurontin, plaquenil, prednisone 5 qd, crestor, prograf 0.5 mg qam and 1.0 mg gpm, valganciclovir 450 qd    Date   Creat  eGFR   2011   3.1 >> 2.2  AKI episode   2013   1.52   2015- 2016  2.11- 2.05 Jul 2017  4.5- 5.70   Aug 2019  3.76   Aug 2020  1.62  DDRT 05/2018   May 2021  2.35   May 2022  2.67- 3.42   June 2022  3.42    July 2022  3.05- 3.43 14- 17 ml/min, stage IV   Oct 15, 2020  3.33  15, stage IV   11/01/20  4.73   11/02/20  4.65      UA cloudy, many bact, >50 wbc, 0-5 rbc, neg nit, mod LE, prot 100   Na 132  K 5  CO2 20 BUN 84  Cr 4.65  CA 9.3  Alb 3.2  LFT"s okay egfr 10   WBC 3K  Hb 10.7 plt 99K    CXR 9/17 - IMPRESSION: Gross cardiomegaly. Small left pleural effusion and associated atelectasis or consolidation, not significantly changed in appearance compared to prior radiographs dated 08/31.   CT abd 9/17 - pelvic transplant kidney w/ mild assoc pelviectasis and some fat attenuation associated. R ureter w/o stones; also bilat pleural effusions.   Assessment/ Plan: AKI on CKD IV - b/l creat is 3.0- 3.4 from July 2022, egfr 14- 90m/min.  Pt admitted w/ abd pain, dark stools and creat here is 4.7 on admit 9/17. Appears to have UTI. Imaging is w/o obstruction, UA c/w infection. Looks dry on exam, except for ankle edema which is likely R sided/ chronic issue. Suspect AKI due to intravasc hypovolemia from poor po intake w/ ongoing diuretic usage at home. BP's are okay. Would continue usual IS meds (prograf, prednisone). Will start IVF"s at 75 cc/hr and give small bolus 250 cc. Get transplant [place] urine lytes. No signs or uremia, no need for RRT at this time. Will follow.  UTI/pyelo - likely of transplant kidney given UA and CT findings. On rocephin IV. Per pmd GIB - FOBT +, Hb 10.3, GI consulting HFrEF -  EF is 20-25% from last echo. Holding torsemide w/ [place].  HTN - bidil, toprol xl on hold due to normotension and bradycardia CAD sp PCI/ DES to LAD 9/20 Hx CMV viremia - cont Valcyte      Rob Bambie Pizzolato  MD 11/02/2020, 2:25 PM  Recent Labs  Lab 11/01/20 1200 11/02/20 0632  WBC 3.5* 3.2*  HGB 10.6* 10.7*   Recent Labs  Lab 11/01/20 1200 11/02/20 0632  K 5.0 5.0  BUN 85* 84*  CREATININE 4.73* 4.65*  CALCIUM 9.5 9.3  PHOS  --  5.5*

## 2020-11-02 NOTE — Progress Notes (Signed)
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 Patient has been having black stool for a week.  She takes aspirin 80 mg daily.  She was previously on Eliquis for history of A. fib but has not been on this recently for unclear reasons.  She also has been having lower abdominal discomfort..  She has noted decreased urine output. In the ED, CT abdomen/pelvis showed transplanted kidney in the right pelvis with mild associated pelviectasis which is age-indeterminate.  The right ureter prominent without evidence of stone.  Gallbladder wall was thickened.  Abdominal ultrasound showed thickened gallbladder wall without evidence of stones sludge or Murphy sign.  Hyperechoic mass noted in the liver measuring up to 1.3 cm. FOBT was positive   Subjective   Patient seen and examined, continues to have epigastric pain.  FOBT is positive   Assessment/Plan:     Acute kidney injury on CKD stage IV -Patient baseline creatinine is around 3-3.3, presented with creatinine of 4.73 -Likely from dehydration -Patient has transplanted kidney; continue Plaquenil, tacrolimus, prednisone -Torsemide is on hold for now -We will consult nephrology for further management  UTI/pyelonephritis of transplanted kidney -Patient has abnormal UA; CT abdomen/pelvis shows nonspecific findings in the transplanted kidney -Concerning for pyonephritis -Started on empiric IV ceftriaxone -Follow urine culture  results  FOBT positive/melena -Patient presented with epigastric abdominal pain -FOBT is positive -Hemoglobin stable at 10.3 -We will consult GI for possible EGD -Continue IV Protonix infusion  HFrEF -EF 20 to 25%; [facility] cardiology -Torsemide on hold due to worsening renal function as above -Holding BiDil, Toprol XL due to normotension and bradycardia respectively  CAD s/p STEMI, DES to LAD 9/20 -No chest pain -Continue rosuvastatin, aspirin -Toprol XL on hold  Thrombocytopenia -Mild, concern for potential upper GI bleed as above -Aspirin on hold  Liver mass -Seen on abdominal ultrasound, 1.3 cm likely hemangioma -Given irregular shape follow-up imaging recommended with CT abdomen pelvis with contrast versus MRI -Given renal dysfunction and transplanted kidney will not be advisable to give contrast -We will discuss with GI  History of CMV viremia -Continue Valcyte  Gallbladder wall thickening -Noted on RUQ ultrasound without evidence of stone, sludge, Murphy sign -Visualized portion of CBD measures 2 mm -No symptoms of cholecystitis -Continue to monitor     Data Reviewed:   CBG:  No results for input(s): GLUCAP in the last 168 hours.  SpO2: 100 %    Vitals:   11/02/20 0700 11/02/20 1000 11/02/20 1319 11/02/20 1354  BP: 127/70 133/71 136/71   Pulse: (!) 51 (!) 54 (!) 57   Resp: 18 15 14    Temp:   98.3 F (36.8 C)   TempSrc:   Oral   SpO2: 97% 100% 100%   Weight:    91 kg  Height:    5\' 7"  (1.702 m)     Intake/Output Summary (Last 24 hours) at 11/02/2020 1418 Last data filed at 11/02/2020 0425 Gross per 24 hour  Intake 556.07 ml  Output --  Net  556.07 ml    09/16 1901 - 09/18 0700 In: 556.1 [I.V.:56.1] Out: -   Filed Weights   11/01/20 1218 11/02/20 1354  Weight: 90 kg 91 kg    Data Reviewed: Basic Metabolic Panel: Recent Labs  Lab 11/01/20 1200 11/02/20 0632  NA 131* 132*  K 5.0 5.0  CL 101 99  CO2 19* 20*   GLUCOSE 101* 72  BUN 85* 84*  CREATININE 4.73* 4.65*  CALCIUM 9.5 9.3  MG  --  2.6*  PHOS  --  5.5*   Liver Function Tests: Recent Labs  Lab 11/01/20 1200  AST 31  ALT 33  ALKPHOS 115  BILITOT 1.1  PROT 6.7  ALBUMIN 3.2*   Recent Labs  Lab 11/01/20 1200  LIPASE 26   No results for input(s): AMMONIA in the last 168 hours. CBC: Recent Labs  Lab 11/01/20 1200 11/02/20 0632  WBC 3.5* 3.2*  HGB 10.6* 10.7*  HCT 32.6* 33.2*  MCV 90.8 92.7  PLT 96* 99*   Cardiac Enzymes: No results for input(s): CKTOTAL, CKMB, CKMBINDEX, TROPONINI in the last 168 hours. BNP (last 3 results) Recent Labs    07/11/20 0406 08/21/20 1053 10/15/20 1811  BNP >4,500.0* >4,500.0* >4,500.0*    ProBNP (last 3 results) No results for input(s): PROBNP in the last 8760 hours.  CBG: No results for input(s): GLUCAP in the last 168 hours.  Recent Results (from the past 240 hour(s))  SARS CORONAVIRUS 2 (TAT 6-24 HRS) Nasopharyngeal Nasopharyngeal Swab     Status: None   Collection Time: 11/01/20 11:14 PM   Specimen: Nasopharyngeal Swab  Result Value Ref Range Status   SARS Coronavirus 2 NEGATIVE NEGATIVE Final    Comment: (NOTE) SARS-CoV-2 target nucleic acids are NOT DETECTED.  The SARS-CoV-2 RNA is generally detectable in upper and lower respiratory specimens during the acute phase of infection. Negative results do not preclude SARS-CoV-2 infection, do not rule out co-infections with other pathogens, and should not be used as the sole basis for treatment or other patient management decisions. Negative results must be combined with clinical observations, patient history, and epidemiological information. The expected result is Negative.  Fact Sheet for Patients: SugarRoll.be  Fact Sheet for Healthcare Providers: https://www.woods-mathews.com/  This test is not yet approved or cleared by the [place] FDA and  has been authorized for  detection and/or diagnosis of SARS-CoV-2 by FDA under an Emergency Use Authorization (EUA). This EUA will remain  in effect (meaning this test can be used) for the duration of the COVID-19 declaration under Se ction 564(b)(1) of the Act, 21 U.S.C. section 360bbb-3(b)(1), unless the authorization is terminated or revoked sooner.  Performed at [facility], [place] [address], [place], [place] [address]         Scheduled medications:    citalopram  10 mg Oral Daily   gabapentin  100 mg Oral QHS   hydroxychloroquine  200 mg Oral Daily   predniSONE  5 mg Oral Q lunch   rosuvastatin  5 mg Oral q1800   sodium chloride flush  3 mL Intravenous Q12H   tacrolimus  0.5 mg Oral Daily   tacrolimus  1 mg Oral QHS   [START ON 11/03/2020] valGANciclovir  450 mg Oral QODAY    Antibiotics: Anti-infectives (From admission, onward)    Start     Dose/Rate Route Frequency Ordered Stop   11/03/20 1000  valGANciclovir (VALCYTE) 450 MG tablet TABS 450 mg        450  mg Oral Every other day 11/01/20 2320     11/02/20 2200  cefTRIAXone (ROCEPHIN) 1 g in sodium chloride 0.9 % 100 mL IVPB        1 g 200 mL/hr over 30 Minutes Intravenous Daily at bedtime 11/01/20 2318     11/02/20 1030  hydroxychloroquine (PLAQUENIL) tablet 200 mg        200 mg Oral Daily 11/01/20 2320     11/01/20 2200  cefTRIAXone (ROCEPHIN) 1 g in sodium chloride 0.9 % 100 mL IVPB        1 g 200 mL/hr over 30 Minutes Intravenous  Once 11/01/20 2145 11/01/20 2230        Radiology Reports  CT ABDOMEN PELVIS WO CONTRAST  Result Date: 11/01/2020 CLINICAL DATA:  Right lower quadrant pain. Evaluate for appendicitis. EXAM: CT ABDOMEN AND PELVIS WITHOUT CONTRAST TECHNIQUE: Multidetector CT imaging of the abdomen and pelvis was performed following the standard protocol without IV contrast. COMPARISON:  CT scan October 31, 2017 FINDINGS: Lower chest: Small bilateral pleural effusions are identified. There may be a loculated component  of pleural fluid anteriorly on the right. Three-vessel coronary artery disease identified. Mild cardiomegaly. The lung bases are normal. Hepatobiliary: The liver is normal in appearance. The gallbladder appears to be thick walled with adjacent stranding. The gallbladder appears to contain high attenuation material. Pancreas: Unremarkable. No pancreatic ductal dilatation or surrounding inflammatory changes. Spleen: Normal in size without focal abnormality. Adrenals/Urinary Tract: The adrenal glands are unremarkable. The native kidneys are atrophic bilaterally. No hydronephrosis or acute perinephric stranding identified. Vascular calcifications are noted. No suspicious masses. No ureterectasis or stones associated with the native kidneys. A transplant kidney is seen in the right side of the pelvis with no significant adjacent fat stranding identified. There is mild prominence of the transplant kidneys pelvis, calices, and ureter without an identified stone. There is increased attenuation in the fat adjacent to the transplant kidneys renal pelvis. The bladder is unremarkable. Stomach/Bowel: The stomach and small bowel are normal. Scattered colonic diverticuli are seen without diverticulitis. The appendix is normal. Vascular/Lymphatic: Atherosclerotic change is seen in the nonaneurysmal aorta, iliac vessels, and femoral vessels. Reproductive: Uterus and bilateral adnexa are unremarkable. Other: Increased attenuation throughout the subcutaneous fat diffusely consistent with edema. A small amount of free fluid is seen in the pelvis. Musculoskeletal: No acute or significant osseous findings. IMPRESSION: 1. The patient has a transplanted kidney in the right pelvis. There is mild associated pelvicaliectasis which is age indeterminate. There is mild increased attenuation in the fat adjacent to the right renal pelvis. The right ureter is prominent but no stones are seen. The findings are age indeterminate and of uncertain  etiology. Recommend clinical correlation. 2. The gallbladder wall is thickened. The gallbladder contains high attenuation material. Fat stranding adjacent to the gallbladder. Recommend ultrasound for better evaluation. 3. The appendix is normal in appearance. 4. Bilateral pleural effusions. 5. Diffuse soft tissue edema.  Mild ascites in the pelvis. 6. Atherosclerotic change in the nonaneurysmal aorta, iliac vessels, and femoral vessels. Coronary artery disease. 7. Scattered colonic diverticuli without diverticulitis. Electronically Signed   By: Dorise Bullion III M.D.   On: 11/01/2020 14:56   DG Chest Portable 1 View  Result Date: 11/01/2020 CLINICAL DATA:  Pleural effusion EXAM: PORTABLE CHEST 1 VIEW COMPARISON:  10/15/2020, same day CT abdomen pelvis, 08/31/2020 FINDINGS: Gross cardiomegaly. Small left pleural effusion and associated atelectasis or consolidation, not significant changed in appearance compared to prior radiographs dated 08/31. The  visualized skeletal structures are unremarkable. IMPRESSION: 1.  Gross cardiomegaly. 2. Small left pleural effusion and associated atelectasis or consolidation, not significantly changed in appearance compared to prior radiographs dated 08/31. Electronically Signed   By: Eddie Candle M.D.   On: 11/01/2020 15:40   US Abdomen Limited RUQ (LIVER/GB)  Result Date: 11/01/2020 CLINICAL DATA:  Abnormality seen on CT imaging. Right upper quadrant pain. EXAM: ULTRASOUND ABDOMEN LIMITED RIGHT UPPER QUADRANT COMPARISON:  CT scan November 01, 2020 FINDINGS: Gallbladder: Gallbladder wall is thickened to 1.1 cm. No stones, sludge, or Murphy's sign. Common bile duct: Diameter: The common bile duct measures 2 mm on limited imaging/visualization. Liver: A hyperechoic mass is seen in the liver measuring 1.3 x 1.1 x 1.0 cm. No other abnormalities. Portal vein is patent on color Doppler imaging with normal direction of blood flow towards the liver. Other: None. IMPRESSION: 1. The  gallbladder wall is thickened to 1.1 cm. No stones, sludge, or Murphy's sign. The gallbladder wall thickening is a nonspecific finding given the absence of other findings. If the clinical picture remains ambiguous, a HIDA scan could further evaluate. 2. Evaluation of the common bile duct is limited but the visualized portion is normal in caliber. 3. The hyperechoic mass in the liver measuring up to 1.3 cm is nonspecific but most likely a hemangioma. This mass is somewhat irregular in shape. Recommend either a CT scan with contrast and delayed imaging for confirmation or an MRI. Electronically Signed   By: Dorise Bullion III M.D.   On: 11/01/2020 16:38      DVT prophylaxis: SCDs  Code Status: Full code  Family Communication: No family at bedside   Consultants:   Procedures:     Objective    Physical Examination:   General-appears in no acute distress Heart-S1-S2, regular, no murmur auscultated Lungs-clear to auscultation bilaterally, no wheezing or crackles auscultated Abdomen-soft, positive epigastric tenderness to palpation Extremities-bilateral edema in the lower extremities Neuro-alert, oriented x3, no focal deficit noted  Status is: Inpatient  Dispo: The patient is from: Home              Anticipated d/c is to: Home              Anticipated d/c date is: 11/04/2020              Patient currently not stable for discharge  Barrier to discharge-acute kidney injury, melena  COVID-19 Labs  No results for input(s): DDIMER, FERRITIN, LDH, CRP in the last 72 hours.  Lab Results  Component Value Date   SARSCOV2NAA NEGATIVE 11/01/2020   [place] NEGATIVE 10/15/2020   [place] NEGATIVE 08/21/2020   [place] NEGATIVE 07/11/2020          [place]   [facility] If 7PM-7AM, please contact night-coverage at [email or web address], Office  [phone]   11/02/2020, 2:18 PM  LOS: 0 days

## 2020-11-03 ENCOUNTER — Inpatient Hospital Stay (HOSPITAL_COMMUNITY): Payer: Medicare Other

## 2020-11-03 ENCOUNTER — Ambulatory Visit: Payer: Medicare Other | Admitting: Family Medicine

## 2020-11-03 ENCOUNTER — Encounter (HOSPITAL_COMMUNITY): Payer: Self-pay | Admitting: Family Medicine

## 2020-11-03 ENCOUNTER — Telehealth: Payer: Self-pay

## 2020-11-03 LAB — CBC
HCT: 31 % — ABNORMAL LOW (ref 36.0–46.0)
Hemoglobin: 9.9 g/dL — ABNORMAL LOW (ref 12.0–15.0)
MCH: 29.3 pg (ref 26.0–34.0)
MCHC: 31.9 g/dL (ref 30.0–36.0)
MCV: 91.7 fL (ref 80.0–100.0)
Platelets: 91 10*3/uL — ABNORMAL LOW (ref 150–400)
RBC: 3.38 MIL/uL — ABNORMAL LOW (ref 3.87–5.11)
RDW: 22.3 % — ABNORMAL HIGH (ref 11.5–15.5)
WBC: 3.1 10*3/uL — ABNORMAL LOW (ref 4.0–10.5)
nRBC: 0 % (ref 0.0–0.2)

## 2020-11-03 LAB — COMPREHENSIVE METABOLIC PANEL
ALT: 27 U/L (ref 0–44)
AST: 22 U/L (ref 15–41)
Albumin: 2.7 g/dL — ABNORMAL LOW (ref 3.5–5.0)
Alkaline Phosphatase: 106 U/L (ref 38–126)
Anion gap: 12 (ref 5–15)
BUN: 82 mg/dL — ABNORMAL HIGH (ref 8–23)
CO2: 18 mmol/L — ABNORMAL LOW (ref 22–32)
Calcium: 9.1 mg/dL (ref 8.9–10.3)
Chloride: 102 mmol/L (ref 98–111)
Creatinine, Ser: 4.72 mg/dL — ABNORMAL HIGH (ref 0.44–1.00)
GFR, Estimated: 10 mL/min — ABNORMAL LOW (ref >60)
Glucose, Bld: 77 mg/dL (ref 70–99)
Potassium: 4.6 mmol/L (ref 3.5–5.1)
Sodium: 132 mmol/L — ABNORMAL LOW (ref 135–145)
Total Bilirubin: 0.9 mg/dL (ref 0.3–1.2)
Total Protein: 5.8 g/dL — ABNORMAL LOW (ref 6.5–8.1)

## 2020-11-03 MED ORDER — SODIUM CHLORIDE 0.9 % IV SOLN
1.0000 g | INTRAVENOUS | Status: DC
  Administered 2020-11-03: 1 g via INTRAVENOUS
  Filled 2020-11-03 (×2): qty 1

## 2020-11-03 NOTE — Plan of Care (Signed)

## 2020-11-03 NOTE — Progress Notes (Signed)
[facility] ROUNDING NOTE   Subjective:   Interval History: This is a 65 year old lady with history end-stage renal disease status post renal transplant April 2020.  She has a history of hypertension diastolic dysfunction history of CVA presented 917 with passage of dark stools.  She has acute kidney injury with a creatinine that increased to 4.73 creatinine on 10/15/2020 was 3.3.  She has no evidence of obstruction.  Her baseline creatinine appears to be in the twos.  She is followed by Dr. Johnney Ou at [facility].  There is a recent increase of torsemide from 2 mg twice daily to 3 mg morning and 2 mg in evening.  She has a history of left upper arm AV fistula placement.  She continues on Prograf and prednisone.  Blood pressure 144/91 pulse 64 temperature 97.7 O2 sats 97% room air  No recorded urine output  Sodium 130 potassium 5 chloride 99 CO2 20 BUN 84 creatinine 4.65 glucose 72 calcium 9.3 hemoglobin 9.9.  Urinalysis no red blood cells greater than 50 white blood cells protein 100 mg/dL Ultrasound shows a hypoechoic mass 1.3 cm nonspecific consistent with a hemangioma.  Thickening of the gallbladder wall 1.1 cm.  CT scan shows no evidence of obstruction.  Doppler ultrasound has been ordered of her transplanted kidney.  She had elevated tacrolimus level 10.5.   Objective:  Vital signs in last 24 hours:  Temp:  [97.7 F (36.5 C)-98.3 F (36.8 C)] 97.7 F (36.5 C) (09/19 0734) Pulse Rate:  [54-58] 56 (09/19 0734) Resp:  [14-19] 17 (09/19 0428) BP: (106-144)/(55-91) 144/91 (09/19 0734) SpO2:  [93 %-100 %] 97 % (09/19 0734) Weight:  [91 kg-93.9 kg] 93.9 kg (09/19 0428)  Weight change: 0.961 kg Filed Weights   11/01/20 1218 11/02/20 1354 11/03/20 0428  Weight: 90 kg 91 kg 93.9 kg    Intake/Output: I/O last 3 completed shifts: In: 2090.7 [P.O.:720; I.V.:870.7; IV Piggyback:500] Out: -    Intake/Output this shift:  No intake/output data recorded.  Gen  alert, no distress, obese No rash, cyanosis or gangrene Sclera anicteric, throat clear, slightly dry mouth No jvd or bruits Chest clear bilat to bases, no rales/ wheezing RRR no MRG Abd soft ntnd no mass or ascites +bs GU defer MS no joint effusions or deformity Ext 1-2+ ankle/ pretib edema bilat, no wounds or ulcers Neuro is alert, Ox 3 , nf, no asterixis LUA AVF + bruit   Basic Metabolic Panel: Recent Labs  Lab 11/01/20 1200 11/02/20 0632  NA 131* 132*  K 5.0 5.0  CL 101 99  CO2 19* 20*  GLUCOSE 101* 72  BUN 85* 84*  CREATININE 4.73* 4.65*  CALCIUM 9.5 9.3  MG  --  2.6*  PHOS  --  5.5*    Liver Function Tests: Recent Labs  Lab 11/01/20 1200  AST 31  ALT 33  ALKPHOS 115  BILITOT 1.1  PROT 6.7  ALBUMIN 3.2*   Recent Labs  Lab 11/01/20 1200  LIPASE 26   No results for input(s): AMMONIA in the last 168 hours.  CBC: Recent Labs  Lab 11/01/20 1200 11/02/20 0632 11/03/20 0703  WBC 3.5* 3.2* 3.1*  HGB 10.6* 10.7* 9.9*  HCT 32.6* 33.2* 31.0*  MCV 90.8 92.7 91.7  PLT 96* 99* 91*    Cardiac Enzymes: No results for input(s): CKTOTAL, CKMB, CKMBINDEX, TROPONINI in the last 168 hours.  BNP: Invalid input(s): POCBNP  CBG: No results for input(s): GLUCAP in the last 168 hours.  Microbiology: Results for orders placed or performed during the hospital encounter of 11/01/20  SARS CORONAVIRUS 2 (TAT 6-24 HRS) Nasopharyngeal Nasopharyngeal Swab     Status: None   Collection Time: 11/01/20 11:14 PM   Specimen: Nasopharyngeal Swab  Result Value Ref Range Status   SARS Coronavirus 2 NEGATIVE NEGATIVE Final    Comment: (NOTE) SARS-CoV-2 target nucleic acids are NOT DETECTED.  The SARS-CoV-2 RNA is generally detectable in upper and lower respiratory specimens during the acute phase of infection. Negative results do not preclude SARS-CoV-2 infection, do not rule out co-infections with other pathogens, and should not be used as the sole basis for treatment  or other patient management decisions. Negative results must be combined with clinical observations, patient history, and epidemiological information. The expected result is Negative.  Fact Sheet for Patients: SugarRoll.be  Fact Sheet for Healthcare Providers: https://www.woods-mathews.com/  This test is not yet approved or cleared by the [place] FDA and  has been authorized for detection and/or diagnosis of SARS-CoV-2 by FDA under an Emergency Use Authorization (EUA). This EUA will remain  in effect (meaning this test can be used) for the duration of the COVID-19 declaration under Se ction 564(b)(1) of the Act, 21 U.S.C. section 360bbb-3(b)(1), unless the authorization is terminated or revoked sooner.  Performed at [facility], [place] [address], [place], [place] [address]     Coagulation Studies: No results for input(s): LABPROT, INR in the last 72 hours.  Urinalysis: Recent Labs    11/01/20 2112  COLORURINE YELLOW  LABSPEC 1.025  PHURINE 6.0  GLUCOSEU NEGATIVE  HGBUR NEGATIVE  BILIRUBINUR NEGATIVE  KETONESUR NEGATIVE  PROTEINUR 100*  NITRITE NEGATIVE  LEUKOCYTESUR MODERATE*      Imaging: CT ABDOMEN PELVIS WO CONTRAST  Result Date: 11/01/2020 CLINICAL DATA:  Right lower quadrant pain. Evaluate for appendicitis. EXAM: CT ABDOMEN AND PELVIS WITHOUT CONTRAST TECHNIQUE: Multidetector CT imaging of the abdomen and pelvis was performed following the standard protocol without IV contrast. COMPARISON:  CT scan October 31, 2017 FINDINGS: Lower chest: Small bilateral pleural effusions are identified. There may be a loculated component of pleural fluid anteriorly on the right. Three-vessel coronary artery disease identified. Mild cardiomegaly. The lung bases are normal. Hepatobiliary: The liver is normal in appearance. The gallbladder appears to be thick walled with adjacent stranding. The gallbladder appears to contain high  attenuation material. Pancreas: Unremarkable. No pancreatic ductal dilatation or surrounding inflammatory changes. Spleen: Normal in size without focal abnormality. Adrenals/Urinary Tract: The adrenal glands are unremarkable. The native kidneys are atrophic bilaterally. No hydronephrosis or acute perinephric stranding identified. Vascular calcifications are noted. No suspicious masses. No ureterectasis or stones associated with the native kidneys. A transplant kidney is seen in the right side of the pelvis with no significant adjacent fat stranding identified. There is mild prominence of the transplant kidneys pelvis, calices, and ureter without an identified stone. There is increased attenuation in the fat adjacent to the transplant kidneys renal pelvis. The bladder is unremarkable. Stomach/Bowel: The stomach and small bowel are normal. Scattered colonic diverticuli are seen without diverticulitis. The appendix is normal. Vascular/Lymphatic: Atherosclerotic change is seen in the nonaneurysmal aorta, iliac vessels, and femoral vessels. Reproductive: Uterus and bilateral adnexa are unremarkable. Other: Increased attenuation throughout the subcutaneous fat diffusely consistent with edema. A small amount of free fluid is seen in the pelvis. Musculoskeletal: No acute or significant osseous findings. IMPRESSION: 1. The patient has a transplanted kidney in the right pelvis. There is mild associated pelvicaliectasis  which is age indeterminate. There is mild increased attenuation in the fat adjacent to the right renal pelvis. The right ureter is prominent but no stones are seen. The findings are age indeterminate and of uncertain etiology. Recommend clinical correlation. 2. The gallbladder wall is thickened. The gallbladder contains high attenuation material. Fat stranding adjacent to the gallbladder. Recommend ultrasound for better evaluation. 3. The appendix is normal in appearance. 4. Bilateral pleural effusions. 5.  Diffuse soft tissue edema.  Mild ascites in the pelvis. 6. Atherosclerotic change in the nonaneurysmal aorta, iliac vessels, and femoral vessels. Coronary artery disease. 7. Scattered colonic diverticuli without diverticulitis. Electronically Signed   By: Dorise Bullion III M.D.   On: 11/01/2020 14:56   DG Chest Portable 1 View  Result Date: 11/01/2020 CLINICAL DATA:  Pleural effusion EXAM: PORTABLE CHEST 1 VIEW COMPARISON:  10/15/2020, same day CT abdomen pelvis, 08/31/2020 FINDINGS: Gross cardiomegaly. Small left pleural effusion and associated atelectasis or consolidation, not significant changed in appearance compared to prior radiographs dated 08/31. The visualized skeletal structures are unremarkable. IMPRESSION: 1.  Gross cardiomegaly. 2. Small left pleural effusion and associated atelectasis or consolidation, not significantly changed in appearance compared to prior radiographs dated 08/31. Electronically Signed   By: Eddie Candle M.D.   On: 11/01/2020 15:40   US Abdomen Limited RUQ (LIVER/GB)  Result Date: 11/01/2020 CLINICAL DATA:  Abnormality seen on CT imaging. Right upper quadrant pain. EXAM: ULTRASOUND ABDOMEN LIMITED RIGHT UPPER QUADRANT COMPARISON:  CT scan November 01, 2020 FINDINGS: Gallbladder: Gallbladder wall is thickened to 1.1 cm. No stones, sludge, or Murphy's sign. Common bile duct: Diameter: The common bile duct measures 2 mm on limited imaging/visualization. Liver: A hyperechoic mass is seen in the liver measuring 1.3 x 1.1 x 1.0 cm. No other abnormalities. Portal vein is patent on color Doppler imaging with normal direction of blood flow towards the liver. Other: None. IMPRESSION: 1. The gallbladder wall is thickened to 1.1 cm. No stones, sludge, or Murphy's sign. The gallbladder wall thickening is a nonspecific finding given the absence of other findings. If the clinical picture remains ambiguous, a HIDA scan could further evaluate. 2. Evaluation of the common bile duct is  limited but the visualized portion is normal in caliber. 3. The hyperechoic mass in the liver measuring up to 1.3 cm is nonspecific but most likely a hemangioma. This mass is somewhat irregular in shape. Recommend either a CT scan with contrast and delayed imaging for confirmation or an MRI. Electronically Signed   By: Dorise Bullion III M.D.   On: 11/01/2020 16:38     Medications:    sodium chloride 75 mL/hr at 11/03/20 0211   cefTRIAXone (ROCEPHIN)  IV 1 g (11/02/20 2221)   pantoprazole 8 mg/hr (11/03/20 0300)    citalopram  10 mg Oral Daily   gabapentin  100 mg Oral QHS   hydroxychloroquine  200 mg Oral Daily   influenza vaccine adjuvanted  0.5 mL Intramuscular Tomorrow-1000   predniSONE  5 mg Oral Q lunch   rosuvastatin  5 mg Oral q1800   sodium chloride flush  3 mL Intravenous Q12H   tacrolimus  0.5 mg Oral Daily   tacrolimus  1 mg Oral QHS   valGANciclovir  450 mg Oral QODAY   acetaminophen **OR** acetaminophen, ondansetron **OR** ondansetron (ZOFRAN) IV  Assessment/ Plan:  AKI on CKD IV - b/l creat is 3.0- 3.4 from July 2022, egfr 14- 64m/min.  Pt admitted w/ abd pain, dark stools and creat here  is 4.7 on admit 9/17. Appears to have UTI. Imaging is w/o obstruction, UA c/w infection. Looks dry on exam, except for ankle edema which is likely R sided/ chronic issue. Suspect AKI due to intravasc hypovolemia from poor po intake w/ ongoing diuretic usage at home.  We will repeat trough tacrolimus level UTI/pyelo - likely of transplant kidney given UA and CT findings. On rocephin IV. Per pmd GIB - FOBT +, Hb 10.3, GI consulting HFrEF - EF is 20-25% from last echo. Holding torsemide w/ Worthy Flank.  HTN - bidil, toprol xl on hold due to normotension and bradycardia CAD sp PCI/ DES to LAD 9/20 Hx CMV viremia - cont Valcyte   LOS: 1 Sherril Croon '@TODAY' '@8' :07 AM

## 2020-11-03 NOTE — Telephone Encounter (Signed)
Next Appt With Gastroenterology Sharyn Creamer, MD)12/03/2020 at 11:10 AM

## 2020-11-03 NOTE — Telephone Encounter (Signed)
-----   Message from Sharyn Creamer, MD sent at 11/02/2020  7:09 PM EDT ----- Hi Zakaria Fromer,   Please arrange for 1 month follow up with this patient with me if possible. To discuss EGD at that time.  Thanks, Lyndee Leo

## 2020-11-04 ENCOUNTER — Telehealth: Payer: Medicare Other

## 2020-11-04 DIAGNOSIS — R195 Other fecal abnormalities: Secondary | ICD-10-CM | POA: Diagnosis not present

## 2020-11-04 DIAGNOSIS — N179 Acute kidney failure, unspecified: Secondary | ICD-10-CM | POA: Diagnosis not present

## 2020-11-04 DIAGNOSIS — N12 Tubulo-interstitial nephritis, not specified as acute or chronic: Secondary | ICD-10-CM | POA: Diagnosis not present

## 2020-11-04 DIAGNOSIS — I5020 Unspecified systolic (congestive) heart failure: Secondary | ICD-10-CM | POA: Diagnosis not present

## 2020-11-04 DIAGNOSIS — I480 Paroxysmal atrial fibrillation: Secondary | ICD-10-CM

## 2020-11-04 LAB — RENAL FUNCTION PANEL
Albumin: 2.7 g/dL — ABNORMAL LOW (ref 3.5–5.0)
Anion gap: 14 (ref 5–15)
BUN: 83 mg/dL — ABNORMAL HIGH (ref 8–23)
CO2: 17 mmol/L — ABNORMAL LOW (ref 22–32)
Calcium: 9.3 mg/dL (ref 8.9–10.3)
Chloride: 101 mmol/L (ref 98–111)
Creatinine, Ser: 4.72 mg/dL — ABNORMAL HIGH (ref 0.44–1.00)
GFR, Estimated: 10 mL/min — ABNORMAL LOW (ref >60)
Glucose, Bld: 96 mg/dL (ref 70–99)
Phosphorus: 5.7 mg/dL — ABNORMAL HIGH (ref 2.5–4.6)
Potassium: 4.8 mmol/L (ref 3.5–5.1)
Sodium: 132 mmol/L — ABNORMAL LOW (ref 135–145)

## 2020-11-04 LAB — URINE CULTURE: Culture: >=100000 — AB

## 2020-11-04 LAB — TACROLIMUS LEVEL: Tacrolimus (FK506) - LabCorp: 19 ng/mL (ref 2.0–20.0)

## 2020-11-04 MED ORDER — SODIUM CHLORIDE 0.9 % IV SOLN
500.0000 mg | Freq: Two times a day (BID) | INTRAVENOUS | Status: DC
  Administered 2020-11-04 – 2020-11-10 (×12): 500 mg via INTRAVENOUS
  Filled 2020-11-04 (×14): qty 0.5

## 2020-11-04 MED ORDER — PANTOPRAZOLE SODIUM 40 MG IV SOLR
40.0000 mg | Freq: Two times a day (BID) | INTRAVENOUS | Status: DC
  Administered 2020-11-04 – 2020-11-10 (×12): 40 mg via INTRAVENOUS
  Filled 2020-11-04 (×13): qty 40

## 2020-11-04 MED ORDER — VALGANCICLOVIR HCL 450 MG PO TABS
450.0000 mg | ORAL_TABLET | ORAL | Status: DC
  Administered 2020-11-06 – 2020-11-10 (×2): 450 mg via ORAL
  Filled 2020-11-04 (×2): qty 1

## 2020-11-04 NOTE — Progress Notes (Signed)
[facility] ROUNDING NOTE   Subjective:   Interval History: This is a 65 year old lady with history end-stage renal disease status post renal transplant April 2020.  She has a history of hypertension diastolic dysfunction history of CVA presented 917 with passage of dark stools.  She has acute kidney injury with a creatinine that increased to 4.73 creatinine on 10/15/2020 was 3.3.  She has no evidence of obstruction.  Her baseline creatinine appears to be in the twos.  She is followed by Dr. Johnney Ou at [facility].  There is a recent increase of torsemide from 2 mg twice daily to 3 mg morning and 2 mg in evening.  She has a history of left upper arm AV fistula placement.  She continues on Prograf and prednisone.  Blood pressure 140/81 pulse 61 temperature 97.7 O2 sats 90% room air  Urine output 200 cc 11/03/2020  Sodium 132 potassium 4.8 chloride 101 CO2 17 BUN 83 creatinine 4.72 glucose 96 calcium 9.3.  Urinalysis no red blood cells greater than 50 white blood cells protein 100 mg/dL  Ultrasound shows a hypoechoic mass 1.3 cm nonspecific consistent with a hemangioma.  Thickening of the gallbladder wall 1.1 cm.  CT scan shows no evidence of obstruction.  Doppler ultrasound has been ordered of her transplanted kidney.  She had elevated tacrolimus level 10.5.   Objective:  Vital signs in last 24 hours:  Temp:  [97.5 F (36.4 C)-98.5 F (36.9 C)] 97.7 F (36.5 C) (09/20 0845) Pulse Rate:  [54-65] 65 (09/20 0845) Resp:  [17-18] 17 (09/20 0300) BP: (128-141)/(75-85) 140/81 (09/20 0845) SpO2:  [95 %-100 %] 100 % (09/20 0845) Weight:  [94 kg] 94 kg (09/20 0300)  Weight change: 3 kg Filed Weights   11/02/20 1354 11/03/20 0428 11/04/20 0300  Weight: 91 kg 93.9 kg 94 kg    Intake/Output: I/O last 3 completed shifts: In: 7510 [P.O.:708; I.V.:1046] Out: 200 [Urine:200]   Intake/Output this shift:  No intake/output data recorded.  Gen alert, no distress,  obese No rash, cyanosis or gangrene Sclera anicteric, throat clear, slightly dry mouth No jvd or bruits Chest clear bilat to bases, no rales/ wheezing RRR no MRG Abd soft ntnd no mass or ascites +bs GU defer MS no joint effusions or deformity Ext 1-2+ ankle/ pretib edema bilat, no wounds or ulcers Neuro is alert, Ox 3 , nf, no asterixis LUA AVF + bruit   Basic Metabolic Panel: Recent Labs  Lab 11/01/20 1200 11/02/20 0632 11/03/20 0703 11/04/20 0352  NA 131* 132* 132* 132*  K 5.0 5.0 4.6 4.8  CL 101 99 102 101  CO2 19* 20* 18* 17*  GLUCOSE 101* 72 77 96  BUN 85* 84* 82* 83*  CREATININE 4.73* 4.65* 4.72* 4.72*  CALCIUM 9.5 9.3 9.1 9.3  MG  --  2.6*  --   --   PHOS  --  5.5*  --  5.7*     Liver Function Tests: Recent Labs  Lab 11/01/20 1200 11/03/20 0703 11/04/20 0352  AST 31 22  --   ALT 33 27  --   ALKPHOS 115 106  --   BILITOT 1.1 0.9  --   PROT 6.7 5.8*  --   ALBUMIN 3.2* 2.7* 2.7*    Recent Labs  Lab 11/01/20 1200  LIPASE 26    No results for input(s): AMMONIA in the last 168 hours.  CBC: Recent Labs  Lab 11/01/20 1200 11/02/20 0632 11/03/20 0703  WBC 3.5* 3.2* 3.1*  HGB 10.6* 10.7* 9.9*  HCT 32.6* 33.2* 31.0*  MCV 90.8 92.7 91.7  PLT 96* 99* 91*     Cardiac Enzymes: No results for input(s): CKTOTAL, CKMB, CKMBINDEX, TROPONINI in the last 168 hours.  BNP: Invalid input(s): POCBNP  CBG: No results for input(s): GLUCAP in the last 168 hours.  Microbiology: Results for orders placed or performed during the hospital encounter of 11/01/20  Urine Culture     Status: Abnormal   Collection Time: 11/01/20  4:18 PM   Specimen: In/Out Cath Urine  Result Value Ref Range Status   Specimen Description IN/OUT CATH URINE  Final   Special Requests   Final    Immunocompromised Performed at [facility], [place] [address], [place], [place] [address]    Culture >=100,000 COLONIES/mL PSEUDOMONAS AERUGINOSA (A)  Final   Report Status  11/04/2020 FINAL  Final   Organism ID, Bacteria PSEUDOMONAS AERUGINOSA (A)  Final      Susceptibility   Pseudomonas aeruginosa - MIC*    CEFTAZIDIME 16 INTERMEDIATE Intermediate     CIPROFLOXACIN <=0.25 SENSITIVE Sensitive     GENTAMICIN 2 SENSITIVE Sensitive     IMIPENEM 1 SENSITIVE Sensitive     CEFEPIME RESISTANT Resistant     * >=100,000 COLONIES/mL PSEUDOMONAS AERUGINOSA  SARS CORONAVIRUS 2 (TAT 6-24 HRS) Nasopharyngeal Nasopharyngeal Swab     Status: None   Collection Time: 11/01/20 11:14 PM   Specimen: Nasopharyngeal Swab  Result Value Ref Range Status   SARS Coronavirus 2 NEGATIVE NEGATIVE Final    Comment: (NOTE) SARS-CoV-2 target nucleic acids are NOT DETECTED.  The SARS-CoV-2 RNA is generally detectable in upper and lower respiratory specimens during the acute phase of infection. Negative results do not preclude SARS-CoV-2 infection, do not rule out co-infections with other pathogens, and should not be used as the sole basis for treatment or other patient management decisions. Negative results must be combined with clinical observations, patient history, and epidemiological information. The expected result is Negative.  Fact Sheet for Patients: SugarRoll.be  Fact Sheet for Healthcare Providers: https://www.woods-mathews.com/  This test is not yet approved or cleared by the [place] FDA and  has been authorized for detection and/or diagnosis of SARS-CoV-2 by FDA under an Emergency Use Authorization (EUA). This EUA will remain  in effect (meaning this test can be used) for the duration of the COVID-19 declaration under Se ction 564(b)(1) of the Act, 21 U.S.C. section 360bbb-3(b)(1), unless the authorization is terminated or revoked sooner.  Performed at [facility], [place] [address], [place], [place] [address]     Coagulation Studies: No results for input(s): LABPROT, INR in the last 72  hours.  Urinalysis: Recent Labs    11/01/20 2112  COLORURINE YELLOW  LABSPEC 1.025  PHURINE 6.0  GLUCOSEU NEGATIVE  HGBUR NEGATIVE  BILIRUBINUR NEGATIVE  KETONESUR NEGATIVE  PROTEINUR 100*  NITRITE NEGATIVE  LEUKOCYTESUR MODERATE*       Imaging: US Renal Transplant w/Doppler  Result Date: 11/03/2020 CLINICAL DATA:  Acute kidney injury and status post prior renal transplant in 2020. EXAM: ULTRASOUND OF RENAL TRANSPLANT WITH RENAL DOPPLER ULTRASOUND TECHNIQUE: Ultrasound examination of the renal transplant was performed with gray-scale, color and duplex doppler evaluation. COMPARISON:  CT of the abdomen and pelvis without contrast on 11/01/2020. Imaging from prior transplant renal ultrasound on 07/13/2020 at [facility]. FINDINGS: Transplant kidney location: RLQ Transplant Kidney: Renal measurements: 10.6 x 4.8 x 4.2 cm = volume: 112 mL. Normal in size and parenchymal echogenicity.  No evidence of mass or hydronephrosis. No peri-transplant fluid collection seen. Color flow in the main renal artery:  Yes Color flow in the main renal vein:  Yes Duplex Doppler Evaluation: Main Renal Artery Velocity: 70 cm/sec Main Renal Artery Resistive Index: 0.81 Venous waveform in main renal vein:  Present Intrarenal resistive index in upper pole:  0.61 (normal 0.6-0.8; equivocal 0.8-0.9; abnormal >= 0.9) Intrarenal resistive index in lower pole: 0.62 (normal 0.6-0.8; equivocal 0.8-0.9; abnormal >= 0.9) Bladder: Normal for degree of bladder distention. Other findings: No free fluid around the transplant kidney or focal fluid collection. IMPRESSION: Unremarkable ultrasound of a transplant kidney in the right pelvis. Electronically Signed   By: Aletta Edouard M.D.   On: 11/03/2020 16:28   MR LIVER WO CONRTAST  Result Date: 11/03/2020 CLINICAL DATA:  History of end-stage renal disease EXAM: MRI ABDOMEN WITHOUT CONTRAST TECHNIQUE: Multiplanar multisequence MR imaging was performed without the administration of  intravenous contrast. COMPARISON:  Abdominal ultrasound dated November 01, 2020 FINDINGS: Lower chest: Cardiomegaly. No pericardial effusion. Small bilateral pleural effusions and atelectasis. Hepatobiliary: Marked signal loss is seen throughout the liver on T2 imaging with increased signal on out of phase imaging. Can not evaluate hepatic lesion due to lack of IV contrast. Pancreas: No inflammatory changes or other parenchymal abnormality identified. Spleen:  Normal size.  Diffuse signal loss on T2 imaging. Adrenals/Urinary Tract: Atrophic native kidneys. No evidence of hydronephrosis. T2 hyperintense lesion of the upper pole of the left kidney, likely a simple cyst T1 hyperintense lesion at the mid region of the right kidney, likely a proteinaceous or hemorrhagic cyst, although evaluation is limited due to lack contrast. Right lower quadrant renal transplant with no evidence of hydronephrosis. Stomach/Bowel: Visualized portions within the abdomen are unremarkable. Vascular/Lymphatic: No pathologically enlarged lymph nodes identified. No abdominal aortic aneurysm demonstrated. Other:  Diffuse soft tissue anasarca and trace abdominal ascites. Musculoskeletal: Diffuse signal loss on T2 imaging. No suspicious bone lesions identified. IMPRESSION: Liver lesion which was seen on prior ultrasound is not visualized, likely due to lack of IV contrast. If multiphase contrast-enhanced exam of the liver can not be performed, follow-up ultrasound is recommended in 3-6 months to assess stability. Diffuse wall thickening of the gallbladder, likely secondary to systemic process such as heart failure. If there is clinical concern for acute cholecystitis HIDA scan could be performed. Liver and spleen demonstrate diffuse signal loss on T2 imaging, compatible with secondary hemochromatosis. Diffuse soft tissue anasarca and trace abdominal ascites. Electronically Signed   By: Yetta Glassman M.D.   On: 11/03/2020 12:30      Medications:    sodium chloride 75 mL/hr at 11/03/20 9211   meropenem (MERREM) IV 500 mg (11/04/20 1042)    citalopram  10 mg Oral Daily   gabapentin  100 mg Oral QHS   hydroxychloroquine  200 mg Oral Daily   influenza vaccine adjuvanted  0.5 mL Intramuscular Tomorrow-1000   pantoprazole (PROTONIX) IV  40 mg Intravenous Q12H   predniSONE  5 mg Oral Q lunch   rosuvastatin  5 mg Oral q1800   sodium chloride flush  3 mL Intravenous Q12H   tacrolimus  0.5 mg Oral Daily   tacrolimus  1 mg Oral QHS   [START ON 11/06/2020] valGANciclovir  450 mg Oral Once per day on Mon Thu   acetaminophen **OR** acetaminophen, ondansetron **OR** ondansetron (ZOFRAN) IV  Assessment/ Plan:  AKI on CKD IV - b/l creat is 3.0- 3.4 from July 2022, egfr 14- 99m/min.  Pt admitted w/ abd pain, dark stools and creat here is 4.7 on admit 9/17. Appears to have UTI. Imaging is w/o obstruction, UA c/w infection. Looks dry on exam, except for ankle edema which is likely R sided/ chronic issue. Suspect AKI due to intravasc hypovolemia from poor po intake w/ ongoing diuretic usage at home.  We will repeat trough tacrolimus level.  Looks like she is losing her allograft.  We will need to make preparations for receiving dialysis.  She continues on IV fluids with no clinical evidence of volume overload.  She does have some anasarca with trace abdominal ascites.  We will stop IV fluids at this point UTI/pyelo - likely of transplant kidney given UA and CT findings. On meropenem.    GIB - FOBT +, Hb 10.3, GI consulting HFrEF - EF is 20-25% from last echo. Holding torsemide w/ Worthy Flank.  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

## 2020-11-04 NOTE — Progress Notes (Addendum)
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 Patient has been having black stool for a week.  She takes aspirin 80 mg daily.  She was previously on Eliquis for history of A. fib but has not been on this recently for unclear reasons.  She also has been having lower abdominal discomfort..  She has noted decreased urine output. In the ED, CT abdomen/pelvis showed transplanted kidney in the right pelvis with mild associated pelviectasis which is age-indeterminate.  The right ureter prominent without evidence of stone.  Gallbladder wall was thickened.  Abdominal ultrasound showed thickened gallbladder wall without evidence of stones sludge or Murphy sign.  Hyperechoic mass noted in the liver measuring up to 1.3 cm. FOBT was positive   Subjective   Patient seen and examined, complains of right ankle pain.   Assessment/Plan:     Acute kidney injury on CKD stage IV -Patient baseline creatinine is around 3-3.3, presented with creatinine of 4.73 -Likely from dehydration -Patient has transplanted kidney; continue Plaquenil, tacrolimus, prednisone -Torsemide is on hold for now -Nephrology consulted, feels that she is losing her allograft; might need hemodialysis  UTI/pyelonephritis of transplanted kidney -Patient has abnormal UA; CT abdomen/pelvis shows nonspecific findings in the transplanted kidney -Concerning for pyonephritis -Urine culture growing Pseudomonas aeruginosa  resistant to cefepime and ceftazidime -We will start meropenem per pharmacy consultation   FOBT positive/melena -Patient presented with epigastric abdominal pain -FOBT is positive -Hemoglobin stable at 9.9 -GI was consulted, stool on brown, no plans for EGD during this hospitalization -GI recommends to continue with PPI twice daily and follow-up GI clinic as outpatient for possible EGD  HFrEF -EF 20 to 25%; [facility] cardiology -Torsemide on hold due to worsening renal function as above -Holding BiDil, Toprol XL due to normotension and bradycardia respectively  CAD s/p STEMI, DES to LAD 9/20 -No chest pain -Continue rosuvastatin, aspirin -Toprol XL on hold  Thrombocytopenia -Mild, concern for potential upper GI bleed as above -Aspirin on hold  Liver mass -Seen on abdominal ultrasound, 1.3 cm likely hemangioma -Given irregular shape follow-up imaging recommended with CT abdomen pelvis with contrast versus MRI -Given renal dysfunction and transplanted kidney will not be advisable to give contrast -MRI of liver obtained, no clear-mass noted on MRI of liver  History of CMV viremia -Continue Valcyte  Gallbladder wall thickening -Noted on RUQ ultrasound without evidence of stone, sludge, Murphy sign -Visualized portion of CBD measures 2 mm -No symptoms of cholecystitis -Continue to monitor  Right ankle tenderness -Patient has mild right ankle tenderness, no erythema noted -We will obtain x-ray of right ankle    Data Reviewed:   CBG:  No results for input(s): GLUCAP in the last 168 hours.  SpO2: 99 %    Vitals:   11/04/20 0034 11/04/20 0300 11/04/20 0845 11/04/20 1119  BP: 134/81 (!) 141/75 140/81 121/73  Pulse: (!) 57 60 65 (!) 59  Resp: 18 17    Temp: 98.5 F (36.9 C) 98.5 F (36.9 C) 97.7 F (36.5 C)  97.8 F (36.6 C)  TempSrc: Oral Oral Oral Oral  SpO2: 98% 95% 100% 99%  Weight:  94 kg    Height:         Intake/Output Summary (Last 24  hours) at 11/04/2020 1519 Last data filed at 11/04/2020 1501 Gross per 24 hour  Intake 1077.62 ml  Output 525 ml  Net 552.62 ml    09/18 1901 - 09/20 0700 In: 4401 [P.O.:708; I.V.:1046] Out: 200 [Urine:200]  Filed Weights   11/02/20 1354 11/03/20 0428 11/04/20 0300  Weight: 91 kg 93.9 kg 94 kg    Data Reviewed: Basic Metabolic Panel: Recent Labs  Lab 11/01/20 1200 11/02/20 0632 11/03/20 0703 11/04/20 0352  NA 131* 132* 132* 132*  K 5.0 5.0 4.6 4.8  CL 101 99 102 101  CO2 19* 20* 18* 17*  GLUCOSE 101* 72 77 96  BUN 85* 84* 82* 83*  CREATININE 4.73* 4.65* 4.72* 4.72*  CALCIUM 9.5 9.3 9.1 9.3  MG  --  2.6*  --   --   PHOS  --  5.5*  --  5.7*   Liver Function Tests: Recent Labs  Lab 11/01/20 1200 11/03/20 0703 11/04/20 0352  AST 31 22  --   ALT 33 27  --   ALKPHOS 115 106  --   BILITOT 1.1 0.9  --   PROT 6.7 5.8*  --   ALBUMIN 3.2* 2.7* 2.7*   Recent Labs  Lab 11/01/20 1200  LIPASE 26   No results for input(s): AMMONIA in the last 168 hours. CBC: Recent Labs  Lab 11/01/20 1200 11/02/20 0632 11/03/20 0703  WBC 3.5* 3.2* 3.1*  HGB 10.6* 10.7* 9.9*  HCT 32.6* 33.2* 31.0*  MCV 90.8 92.7 91.7  PLT 96* 99* 91*   Cardiac Enzymes: No results for input(s): CKTOTAL, CKMB, CKMBINDEX, TROPONINI in the last 168 hours. BNP (last 3 results) Recent Labs    07/11/20 0406 08/21/20 1053 10/15/20 1811  BNP >4,500.0* >4,500.0* >4,500.0*    ProBNP (last 3 results) No results for input(s): PROBNP in the last 8760 hours.  CBG: No results for input(s): GLUCAP in the last 168 hours.  Recent Results (from the past 240 hour(s))  Urine Culture     Status: Abnormal   Collection Time: 11/01/20  4:18 PM   Specimen: In/Out Cath Urine  Result Value Ref Range Status   Specimen Description IN/OUT CATH URINE  Final   Special Requests   Final    Immunocompromised Performed at [facility], [address] [place], [place] [address]    Culture >=100,000  COLONIES/mL PSEUDOMONAS AERUGINOSA (A)  Final   Report Status 11/04/2020 FINAL  Final   Organism ID, Bacteria PSEUDOMONAS AERUGINOSA (A)  Final      Susceptibility   Pseudomonas aeruginosa - MIC*    CEFTAZIDIME 16 INTERMEDIATE Intermediate     CIPROFLOXACIN <=0.25 SENSITIVE Sensitive     GENTAMICIN 2 SENSITIVE Sensitive     IMIPENEM 1 SENSITIVE Sensitive     CEFEPIME RESISTANT Resistant     * >=100,000 COLONIES/mL PSEUDOMONAS AERUGINOSA  SARS CORONAVIRUS 2 (TAT 6-24 HRS) Nasopharyngeal Nasopharyngeal Swab     Status: None   Collection Time: 11/01/20 11:14 PM   Specimen: Nasopharyngeal Swab  Result Value Ref Range Status   SARS Coronavirus 2 NEGATIVE NEGATIVE Final    Comment: (NOTE) SARS-CoV-2 target nucleic acids are NOT DETECTED.  The SARS-CoV-2 RNA is generally detectable in upper and lower respiratory specimens during the acute phase of infection.  Negative results do not preclude SARS-CoV-2 infection, do not rule out co-infections with other pathogens, and should not be used as the sole basis for treatment or other patient management decisions. Negative results must be combined with clinical observations, patient history, and epidemiological information. The expected result is Negative.  Fact Sheet for Patients: SugarRoll.be  Fact Sheet for Healthcare Providers: https://www.woods-mathews.com/  This test is not yet approved or cleared by the [place] FDA and  has been authorized for detection and/or diagnosis of SARS-CoV-2 by FDA under an Emergency Use Authorization (EUA). This EUA will remain  in effect (meaning this test can be used) for the duration of the COVID-19 declaration under Se ction 564(b)(1) of the Act, 21 U.S.C. section 360bbb-3(b)(1), unless the authorization is terminated or revoked sooner.  Performed at [facility], [place] [address], [place], [place] [address]         Scheduled medications:     citalopram  10 mg Oral Daily   gabapentin  100 mg Oral QHS   hydroxychloroquine  200 mg Oral Daily   influenza vaccine adjuvanted  0.5 mL Intramuscular Tomorrow-1000   pantoprazole (PROTONIX) IV  40 mg Intravenous Q12H   predniSONE  5 mg Oral Q lunch   rosuvastatin  5 mg Oral q1800   sodium chloride flush  3 mL Intravenous Q12H   tacrolimus  0.5 mg Oral Daily   tacrolimus  1 mg Oral QHS   [START ON 11/06/2020] valGANciclovir  450 mg Oral Once per day on Mon Thu    Antibiotics: Anti-infectives (From admission, onward)    Start     Dose/Rate Route Frequency Ordered Stop   11/06/20 0900  valGANciclovir (VALCYTE) 450 MG tablet TABS 450 mg        450 mg Oral Once per day on Mon Thu 11/04/20 1046     11/04/20 1015  meropenem (MERREM) 500 mg in sodium chloride 0.9 % 100 mL IVPB        500 mg 200 mL/hr over 30 Minutes Intravenous Every 12 hours 11/04/20 0925     11/03/20 1400  ceFEPIme (MAXIPIME) 1 g in sodium chloride 0.9 % 100 mL IVPB  Status:  Discontinued        1 g 200 mL/hr over 30 Minutes Intravenous Every 24 hours 11/03/20 1258 11/04/20 0925   11/03/20 1000  valGANciclovir (VALCYTE) 450 MG tablet TABS 450 mg  Status:  Discontinued        450 mg Oral Every other day 11/01/20 2320 11/04/20 1046   11/02/20 2200  cefTRIAXone (ROCEPHIN) 1 g in sodium chloride 0.9 % 100 mL IVPB  Status:  Discontinued        1 g 200 mL/hr over 30 Minutes Intravenous Daily at bedtime 11/01/20 2318 11/03/20 1258   11/02/20 1030  hydroxychloroquine (PLAQUENIL) tablet 200 mg        200 mg Oral Daily 11/01/20 2320     11/01/20 2200  cefTRIAXone (ROCEPHIN) 1 g in sodium chloride 0.9 % 100 mL IVPB        1 g 200 mL/hr over 30 Minutes Intravenous  Once 11/01/20 2145 11/01/20 2230        Radiology Reports  US Renal Transplant w/Doppler  Result Date: 11/03/2020 CLINICAL DATA:  Acute kidney injury and status post prior renal transplant in 2020. EXAM: ULTRASOUND OF RENAL TRANSPLANT WITH RENAL DOPPLER  ULTRASOUND TECHNIQUE: Ultrasound examination of the renal transplant was performed with gray-scale, color and duplex doppler evaluation. COMPARISON:  CT  of the abdomen and pelvis without contrast on 11/01/2020. Imaging from prior transplant renal ultrasound on 07/13/2020 at [facility]. FINDINGS: Transplant kidney location: RLQ Transplant Kidney: Renal measurements: 10.6 x 4.8 x 4.2 cm = volume: 112 mL. Normal in size and parenchymal echogenicity. No evidence of mass or hydronephrosis. No peri-transplant fluid collection seen. Color flow in the main renal artery:  Yes Color flow in the main renal vein:  Yes Duplex Doppler Evaluation: Main Renal Artery Velocity: 70 cm/sec Main Renal Artery Resistive Index: 0.81 Venous waveform in main renal vein:  Present Intrarenal resistive index in upper pole:  0.61 (normal 0.6-0.8; equivocal 0.8-0.9; abnormal >= 0.9) Intrarenal resistive index in lower pole: 0.62 (normal 0.6-0.8; equivocal 0.8-0.9; abnormal >= 0.9) Bladder: Normal for degree of bladder distention. Other findings: No free fluid around the transplant kidney or focal fluid collection. IMPRESSION: Unremarkable ultrasound of a transplant kidney in the right pelvis. Electronically Signed   By: Aletta Edouard M.D.   On: 11/03/2020 16:28   MR LIVER WO CONRTAST  Result Date: 11/03/2020 CLINICAL DATA:  History of end-stage renal disease EXAM: MRI ABDOMEN WITHOUT CONTRAST TECHNIQUE: Multiplanar multisequence MR imaging was performed without the administration of intravenous contrast. COMPARISON:  Abdominal ultrasound dated November 01, 2020 FINDINGS: Lower chest: Cardiomegaly. No pericardial effusion. Small bilateral pleural effusions and atelectasis. Hepatobiliary: Marked signal loss is seen throughout the liver on T2 imaging with increased signal on out of phase imaging. Can not evaluate hepatic lesion due to lack of IV contrast. Pancreas: No inflammatory changes or other parenchymal abnormality identified. Spleen:   Normal size.  Diffuse signal loss on T2 imaging. Adrenals/Urinary Tract: Atrophic native kidneys. No evidence of hydronephrosis. T2 hyperintense lesion of the upper pole of the left kidney, likely a simple cyst T1 hyperintense lesion at the mid region of the right kidney, likely a proteinaceous or hemorrhagic cyst, although evaluation is limited due to lack contrast. Right lower quadrant renal transplant with no evidence of hydronephrosis. Stomach/Bowel: Visualized portions within the abdomen are unremarkable. Vascular/Lymphatic: No pathologically enlarged lymph nodes identified. No abdominal aortic aneurysm demonstrated. Other:  Diffuse soft tissue anasarca and trace abdominal ascites. Musculoskeletal: Diffuse signal loss on T2 imaging. No suspicious bone lesions identified. IMPRESSION: Liver lesion which was seen on prior ultrasound is not visualized, likely due to lack of IV contrast. If multiphase contrast-enhanced exam of the liver can not be performed, follow-up ultrasound is recommended in 3-6 months to assess stability. Diffuse wall thickening of the gallbladder, likely secondary to systemic process such as heart failure. If there is clinical concern for acute cholecystitis HIDA scan could be performed. Liver and spleen demonstrate diffuse signal loss on T2 imaging, compatible with secondary hemochromatosis. Diffuse soft tissue anasarca and trace abdominal ascites. Electronically Signed   By: Yetta Glassman M.D.   On: 11/03/2020 12:30      DVT prophylaxis: SCDs  Code Status: Full code  Family Communication: No family at bedside   Consultants:   Procedures:     Objective    Physical Examination:   General-appears in no acute distress Heart-S1-S2, regular, no murmur auscultated Lungs-clear to auscultation bilaterally, no wheezing or crackles auscultated Abdomen-soft, nontender, no organomegaly Extremities-mild edema and tenderness to palpation in the right ankle  Neuro-alert,  oriented x3, no focal deficit noted  Status is: Inpatient  Dispo: The patient is from: Home              Anticipated d/c is to: Home  Anticipated d/c date is: 11/07/2020              Patient currently not stable for discharge  Barrier to discharge-acute kidney injury, melena  COVID-19 Labs  No results for input(s): DDIMER, FERRITIN, LDH, CRP in the last 72 hours.  Lab Results  Component Value Date   SARSCOV2NAA NEGATIVE 11/01/2020   [place] NEGATIVE 10/15/2020   [place] NEGATIVE 08/21/2020   [place] NEGATIVE 07/11/2020          [place]   [facility] If 7PM-7AM, please contact night-coverage at [email or web address], Office  [phone]   11/04/2020, 3:19 PM  LOS: 2 days

## 2020-11-05 DIAGNOSIS — I5022 Chronic systolic (congestive) heart failure: Secondary | ICD-10-CM

## 2020-11-05 DIAGNOSIS — K828 Other specified diseases of gallbladder: Secondary | ICD-10-CM

## 2020-11-05 DIAGNOSIS — I2510 Atherosclerotic heart disease of native coronary artery without angina pectoris: Secondary | ICD-10-CM | POA: Diagnosis not present

## 2020-11-05 DIAGNOSIS — N179 Acute kidney failure, unspecified: Secondary | ICD-10-CM | POA: Diagnosis not present

## 2020-11-05 DIAGNOSIS — R5381 Other malaise: Secondary | ICD-10-CM

## 2020-11-05 DIAGNOSIS — I5020 Unspecified systolic (congestive) heart failure: Secondary | ICD-10-CM | POA: Diagnosis not present

## 2020-11-05 DIAGNOSIS — R601 Generalized edema: Secondary | ICD-10-CM

## 2020-11-05 LAB — CBC
HCT: 32.5 % — ABNORMAL LOW (ref 36.0–46.0)
Hemoglobin: 10.5 g/dL — ABNORMAL LOW (ref 12.0–15.0)
MCH: 29.5 pg (ref 26.0–34.0)
MCHC: 32.3 g/dL (ref 30.0–36.0)
MCV: 91.3 fL (ref 80.0–100.0)
Platelets: UNDETERMINED 10*3/uL (ref 150–400)
RBC: 3.56 MIL/uL — ABNORMAL LOW (ref 3.87–5.11)
RDW: 22.2 % — ABNORMAL HIGH (ref 11.5–15.5)
WBC: 3.6 10*3/uL — ABNORMAL LOW (ref 4.0–10.5)
nRBC: 0.8 % — ABNORMAL HIGH (ref 0.0–0.2)

## 2020-11-05 LAB — COMPREHENSIVE METABOLIC PANEL
ALT: 23 U/L (ref 0–44)
AST: 23 U/L (ref 15–41)
Albumin: 2.7 g/dL — ABNORMAL LOW (ref 3.5–5.0)
Alkaline Phosphatase: 104 U/L (ref 38–126)
Anion gap: 13 (ref 5–15)
BUN: 82 mg/dL — ABNORMAL HIGH (ref 8–23)
CO2: 18 mmol/L — ABNORMAL LOW (ref 22–32)
Calcium: 9.4 mg/dL (ref 8.9–10.3)
Chloride: 100 mmol/L (ref 98–111)
Creatinine, Ser: 4.93 mg/dL — ABNORMAL HIGH (ref 0.44–1.00)
GFR, Estimated: 9 mL/min — ABNORMAL LOW (ref >60)
Glucose, Bld: 92 mg/dL (ref 70–99)
Potassium: 4.8 mmol/L (ref 3.5–5.1)
Sodium: 131 mmol/L — ABNORMAL LOW (ref 135–145)
Total Bilirubin: 0.7 mg/dL (ref 0.3–1.2)
Total Protein: 5.9 g/dL — ABNORMAL LOW (ref 6.5–8.1)

## 2020-11-05 LAB — TACROLIMUS LEVEL: Tacrolimus (FK506) - LabCorp: 14.3 ng/mL (ref 2.0–20.0)

## 2020-11-05 MED ORDER — SODIUM CHLORIDE 0.9 % IV SOLN
INTRAVENOUS | Status: DC | PRN
  Administered 2020-11-05: 250 mL via INTRAVENOUS

## 2020-11-05 NOTE — Plan of Care (Signed)

## 2020-11-05 NOTE — TOC Initial Note (Addendum)
Transition of Care [facility]) - Initial/Assessment Note    Patient Details  Name: Jenna [name] MRN: [id number] Date of Birth: 05/01/1955  [facility]) CM/SW Contact:    Zenon Mayo, RN Phone Number: 11/05/2020, 1:55 PM  Clinical Narrative:                 NCM spoke with patient she lives alone, she still drives, she has a scale at home which she weighs her self daily, she also checks her bp, she has a no salt diet, she states she gets her medications by [facility]  She states her brother or a friend will transport her home at discharge.  She has PCP , follow up apt on AVS.  She states  a cap worker contacted her and she told them she was in the hospital so she did not get the dates or times the cap worker will be working with her.  She would like any new medications to be filled here by the [facility] pharmacy.  Awaiting pt evaluation also.  Patient is active with [facility] for HHRN,HHPT.    Expected Discharge Plan: [place] Barriers to Discharge: Continued Medical Work up   Patient Goals and CMS Choice Patient states their goals for this hospitalization and ongoing recovery are:: return home   Choice offered to / list presented to : NA  Expected Discharge Plan and Services Expected Discharge Plan: [place]   Discharge Planning Services: CM Consult   Living arrangements for the past 2 months: Single Family Home                   DME Agency: NA       HH Arranged: NA          Prior Living Arrangements/Services Living arrangements for the past 2 months: Single Family Home Lives with:: Self Patient language and need for interpreter reviewed:: Yes Do you feel safe going back to the place where you live?: Yes      Need for Family Participation in Patient Care: Yes (Comment) Care giver support system in place?: No (comment) Current home services: Homehealth aide Health and safety inspector) Criminal Activity/Legal  Involvement Pertinent to Current Situation/Hospitalization: No - Comment as needed  Activities of Daily Living Home Assistive Devices/Equipment: Eyeglasses, Cane (specify quad or straight) ADL Screening (condition at time of admission) Patient's cognitive ability adequate to safely complete daily activities?: Yes Is the patient deaf or have difficulty hearing?: No Does the patient have difficulty seeing, even when wearing glasses/contacts?: No Does the patient have difficulty concentrating, remembering, or making decisions?: No Patient able to express need for assistance with ADLs?: Yes Does the patient have difficulty dressing or bathing?: No Independently performs ADLs?: Yes (appropriate for developmental age) Does the patient have difficulty walking or climbing stairs?: Yes Weakness of Legs: Both Weakness of Arms/Hands: None  Permission Sought/Granted                  Emotional Assessment Appearance:: Appears stated age Attitude/Demeanor/Rapport: Engaged Affect (typically observed): Appropriate Orientation: : Oriented to Place, Oriented to Self, Oriented to  Time, Oriented to Situation   Psych Involvement: No (comment)  Admission diagnosis:  Pyelonephritis [N12] Right upper quadrant pain [R10.11] Heme positive stool [R19.5] AKI (acute kidney injury) ([place]) [N17.9] Acute renal failure superimposed on stage 4 chronic kidney disease ([place]) [N17.9, N18.4] Patient Active Problem List   Diagnosis Date Noted   AKI (acute  kidney injury) ([place]) 11/02/2020   Right upper quadrant pain    Acute renal failure superimposed on stage 4 chronic kidney disease ([place]) 11/01/2020   Pyelonephritis of transplanted kidney 11/01/2020   Thrombocytopenia ([place]) 11/01/2020   Liver mass 11/01/2020   Heme positive stool 11/01/2020   Insomnia 10/29/2020   Peripheral neuropathy 10/13/2020   Acute on chronic systolic CHF (congestive heart failure) ([place]) 07/11/2020   Acute respiratory failure with  hypoxia ([name]) 07/11/2020   Cytomegalovirus (CMV) viremia ([place]) 07/11/2020   Elevated troponin 07/11/2020   Anemia of chronic disease 07/11/2020   Acute kidney injury superimposed on chronic kidney disease ([place]) 07/11/2020   Allergic rhinitis 01/01/2020   Anxiety disorder 06/26/2019   HFrEF (heart failure with reduced ejection fraction) ([place]) March 11, 2019   CAD (coronary artery disease) 11-Mar-2019   Paroxysmal atrial fibrillation ([place]) 03/11/2019   Deceased-donor kidney transplant 03/27/2018   Dyslipidemia 11/06/2017   CKD (chronic kidney disease) stage 5, GFR less than 15 ml/min ([name]) 02/15/2017   Back pain at L4-L5 level 10/31/2014   Muscle spasm of back 10/31/2014   Chronic maxillary sinusitis 07/04/2014   Occipital headache 07/04/2014   Increased urinary frequency 05/23/2014   Falls 05/23/2014   Rash and nonspecific skin eruption 05/23/2014   Hypertension with renal disease 04/16/2014   Encounter for immunization 11/19/2013   Lupus (systemic lupus erythematosus) ([place]) 09/24/2013   TIA (transient ischemic attack) 05/05/2013   Numbness and tingling of left side of face 05/04/2013   Lupus ([place]) 04/15/2011   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   PCP:  [name], Betty G, MD Pharmacy:   [place], [address] [place] [address] [place] [address] Phone: [phone] Fax: [phone]  [id number] [place], [address] [place] [address] Phone: [phone] Fax: [phone]     Social Determinants of Health (SDOH) Interventions    Readmission Risk Interventions Readmission Risk Prevention Plan 11/05/2020 08/26/2020  Transportation Screening Complete Complete  PCP or Specialist Appt within 3-5 Days Complete -  HRI or Home Care Consult Complete Complete  Social Work Consult for [place]  Planning/Counseling Complete Complete  Palliative Care Screening Not Applicable Not Applicable  Medication Review Press photographer) Complete Complete  Some recent data might be hidden

## 2020-11-05 NOTE — Progress Notes (Signed)
PROGRESS NOTE  Jenna [name] SNK:[id number] DOB: [birth year]   PCP: [name], Betty G, MD  Patient is from: Home.  Lives alone.  Uses walker at baseline.  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  CT abdomen and pelvis on 9/17 showed transplanted kidney in the right pelvis with age-indeterminate mild associated pelviectasis, prominent right ureter without evidence of stone and thickened gallbladder wall.  Abdominal US on 9/17 showed hyperechoic liver mass measuring about 1.3 cm, thickened gallbladder wall without evidence of stone, sludge or Murphy sign.  MRI of his liver on 9/19 did not confirm the liver mass, diffuse wall thickening of gallbladder and diffuse soft tissue anasarca with trace ascites.  Ultrasound of transplanted kidney on 9/19 unremarkable.  Creatinine remains elevated.  Nephrology following and concerned about losing her allograft and suggest preparation for HD.  There is also concern about Pseudomonas UTI and pyelonephritis of transplanted kidney.  She is on meropenem.  Subjective: Seen and examined earlier this morning.  No major events overnight of this morning.  No major complaints other than some pain in her feet from swelling.  She says she feels weak.  She denies chest pain or shortness of breath.  Denies abdominal pain, nausea or vomiting.  She says she is making "moderate" amount of urine.  Objective: Vitals:   11/04/20 1119 11/04/20 2012  11/05/20 0500 11/05/20 1049  BP: 121/73 137/87  134/68  Pulse: (!) 59 63  64  Resp:  15  18  Temp: 97.8 F (36.6 C) 97.7 F (36.5 C)  98.5 F (36.9 C)  TempSrc: Oral Oral  Oral  SpO2: 99% 100%  95%  Weight:   93.6 kg   Height:        Intake/Output Summary (Last 24 hours) at 11/05/2020 1502 Last data filed at 11/05/2020 1437 Gross per 24 hour  Intake 354 ml  Output --  Net 354 ml   Filed Weights   11/03/20 0428 11/04/20 0300 11/05/20 0500  Weight: 93.9 kg 94 kg 93.6 kg    Examination:  GENERAL: No apparent distress.  Sitting on the edge of the bed. HEENT: MMM.  Vision and hearing grossly intact.  NECK: Supple.  No apparent JVD.  RESP: On RA.  No IWOB.  Fair aeration bilaterally. CVS:  RRR. Heart sounds normal.  ABD/GI/GU: BS+. Abd soft, NTND.  MSK/EXT:  Moves extremities. No apparent deformity.  1+ pitting edema bilaterally SKIN: no apparent skin lesion or wound NEURO: Awake, alert and oriented appropriately.  No apparent focal neuro deficit. PSYCH: Calm. Normal affect.   Procedures:  None  Microbiology summarized: PFXTK-24 and influenza PCR nonreactive. 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  Concern about losing her allograft. Recent Labs    08/21/20 1053 08/22/20 0542 08/23/20 0149 08/26/20 0626 10/15/20 1218 11/01/20 1200 11/02/20 0632 11/03/20 0703 11/04/20 0352 11/05/20 0507  BUN 64* 58* 56* 49* 56* 85* 84* 82* 83* 82*  CREATININE 3.43* 3.30* 3.18* 3.05*  3.33* 4.73* 4.65* 4.72* 4.72* 4.93*  -Nephrology managing-discontinued IV fluid suggesting preparation for HD -Tac level elevated to 18 but nephrology repeating -Continue holding torsemide -Avoid nephrotoxic meds -Immunosuppressive's per nephrology  Pseudomonas UTI/pyelonephritis of transplanted kidney-no convincing radiologic or clinical evidence but she has transplanted kidney.  -Continue IV meropenem  Positive  Hemoccult/melena: H&H stable. Recent Labs    07/12/20 0253 07/12/20 1148 07/30/20 1330 08/21/20 1053 08/22/20 0542 10/15/20 1218 11/01/20 1200 11/02/20 0632 11/03/20 0703 11/05/20 0507  HGB 8.0* 8.3* 8.3 Repeated and verified X2.* 8.6* 9.3* 10.3* 10.6* 10.7* 9.9* 10.5*  -GI recommended PPI twice daily, and outpatient follow-up for possible EGD -Continue monitoring  Chronic systolic CHF: TTE with LVEF of 20 to 25%.  Followed at [facility].  No cardiopulmonary symptoms other than fatigue.  Some signs of anasarca on imaging. -Continue holding torsemide in the setting of AKI -Resume BiDil at lower dose -Continue holding Toprol-XL in the setting of borderline bradycardia -EF 20 to 25%; followed by [facility] cardiology -Monitor fluid status   CAD s/p STEMI, DES to LAD 9/20-no chest pain. -Continue rosuvastatin, aspirin -Toprol XL on hold   Thrombocytopenia -Mild, concern for potential upper GI bleed as above -Aspirin on hold   Liver mass?  Noted on [name] but cannot confirm on MRI although MRI was without contrast. -Repeat imaging in about 3 months   History of CMV viremia -Continue Valcyte   Gallbladder wall thickening-likely due to fluid overload from CHF and renal failure -Management as above.   Right ankle tenderness-reported to previous provider but not today.  She reports bilateral feet pain from edema.   Generalized weakness/physical deconditioning-lives alone.  Uses walker at baseline. -PT/OT  Goal of care counseling-patient with significant comorbidity as above.  Really concerning long-term prognosis.  She is still full code.  I doubt if CPR or intubation is to her best interest. -Consulted palliative care.  Class I obesity Body mass index is 32.32 kg/m.         DVT prophylaxis:  SCDs Start: 11/01/20 2317  Code Status: Full code Family Communication: Patient and/or RN. Available if any question.  Level of care: Telemetry Medical Status is:  Inpatient  Remains inpatient appropriate because:Ongoing diagnostic testing needed not appropriate for outpatient work up, Unsafe d/c plan, and Inpatient level of care appropriate due to severity of illness  Dispo: The patient is from: Home              Anticipated d/c is to:  To be determined              Patient currently is not medically stable to d/c.   Difficult to place patient No       Consultants:  Nephrology Gastroenterology Palliative medicine   Sch Meds:  Scheduled Meds:  citalopram  10 mg Oral Daily   gabapentin  100 mg Oral QHS   hydroxychloroquine  200 mg Oral Daily   influenza vaccine adjuvanted  0.5 mL Intramuscular Tomorrow-1000   pantoprazole (PROTONIX) IV  40 mg Intravenous Q12H   predniSONE  5 mg Oral Q lunch   rosuvastatin  5 mg Oral q1800   sodium chloride flush  3 mL Intravenous Q12H   tacrolimus  0.5 mg Oral Daily   tacrolimus  1 mg Oral QHS   [START ON 11/06/2020] valGANciclovir  450 mg Oral Once per day on Mon Thu   Continuous Infusions:  meropenem (MERREM) IV 500 mg (11/05/20 0904)   PRN Meds:.acetaminophen **OR** acetaminophen, ondansetron **OR**  ondansetron (ZOFRAN) IV  Antimicrobials: Anti-infectives (From admission, onward)    Start     Dose/Rate Route Frequency Ordered Stop   11/06/20 0900  valGANciclovir (VALCYTE) 450 MG tablet TABS 450 mg        450 mg Oral Once per day on Mon Thu 11/04/20 1046     11/04/20 1015  meropenem (MERREM) 500 mg in sodium chloride 0.9 % 100 mL IVPB        500 mg 200 mL/hr over 30 Minutes Intravenous Every 12 hours 11/04/20 0925     11/03/20 1400  ceFEPIme (MAXIPIME) 1 g in sodium chloride 0.9 % 100 mL IVPB  Status:  Discontinued        1 g 200 mL/hr over 30 Minutes Intravenous Every 24 hours 11/03/20 1258 11/04/20 0925   11/03/20 1000  valGANciclovir (VALCYTE) 450 MG tablet TABS 450 mg  Status:  Discontinued        450 mg Oral Every other day 11/01/20 2320 11/04/20 1046   11/02/20 2200  cefTRIAXone  (ROCEPHIN) 1 g in sodium chloride 0.9 % 100 mL IVPB  Status:  Discontinued        1 g 200 mL/hr over 30 Minutes Intravenous Daily at bedtime 11/01/20 2318 11/03/20 1258   11/02/20 1030  hydroxychloroquine (PLAQUENIL) tablet 200 mg        200 mg Oral Daily 11/01/20 2320     11/01/20 2200  cefTRIAXone (ROCEPHIN) 1 g in sodium chloride 0.9 % 100 mL IVPB        1 g 200 mL/hr over 30 Minutes Intravenous  Once 11/01/20 2145 11/01/20 2230        I have personally reviewed the following labs and images: CBC: Recent Labs  Lab 11/01/20 1200 11/02/20 0632 11/03/20 0703 11/05/20 0507  WBC 3.5* 3.2* 3.1* 3.6*  HGB 10.6* 10.7* 9.9* 10.5*  HCT 32.6* 33.2* 31.0* 32.5*  MCV 90.8 92.7 91.7 91.3  PLT 96* 99* 91* PLATELET CLUMPS NOTED ON SMEAR, UNABLE TO ESTIMATE   BMP &GFR Recent Labs  Lab 11/01/20 1200 11/02/20 0632 11/03/20 0703 11/04/20 0352 11/05/20 0507  NA 131* 132* 132* 132* 131*  K 5.0 5.0 4.6 4.8 4.8  CL 101 99 102 101 100  CO2 19* 20* 18* 17* 18*  GLUCOSE 101* 72 77 96 92  BUN 85* 84* 82* 83* 82*  CREATININE 4.73* 4.65* 4.72* 4.72* 4.93*  CALCIUM 9.5 9.3 9.1 9.3 9.4  MG  --  2.6*  --   --   --   PHOS  --  5.5*  --  5.7*  --    Estimated Creatinine Clearance: 13.4 mL/min (A) (by C-G formula based on SCr of 4.93 mg/dL (H)). Liver & Pancreas: Recent Labs  Lab 11/01/20 1200 11/03/20 0703 11/04/20 0352 11/05/20 0507  AST 31 22  --  23  ALT 33 27  --  23  ALKPHOS 115 106  --  104  BILITOT 1.1 0.9  --  0.7  PROT 6.7 5.8*  --  5.9*  ALBUMIN 3.2* 2.7* 2.7* 2.7*   Recent Labs  Lab 11/01/20 1200  LIPASE 26   No results for input(s): AMMONIA in the last 168 hours. Diabetic: No results for input(s): HGBA1C in the last 72 hours. No results for input(s): GLUCAP in the last 168 hours. Cardiac Enzymes: No results for input(s): CKTOTAL, CKMB, CKMBINDEX, TROPONINI in the last 168 hours. No results for input(s): PROBNP in the last 8760 hours. Coagulation Profile: No  results for  input(s): INR, PROTIME in the last 168 hours. Thyroid Function Tests: No results for input(s): TSH, T4TOTAL, FREET4, T3FREE, THYROIDAB in the last 72 hours. Lipid Profile: No results for input(s): CHOL, HDL, LDLCALC, TRIG, CHOLHDL, LDLDIRECT in the last 72 hours. Anemia Panel: No results for input(s): VITAMINB12, FOLATE, FERRITIN, TIBC, IRON, RETICCTPCT in the last 72 hours. Urine analysis:    Component Value Date/Time   COLORURINE YELLOW 11/01/2020 2112   APPEARANCEUR CLOUDY (A) 11/01/2020 2112   LABSPEC 1.025 11/01/2020 2112   PHURINE 6.0 11/01/2020 2112   GLUCOSEU NEGATIVE 11/01/2020 2112   HGBUR NEGATIVE 11/01/2020 2112   BILIRUBINUR NEGATIVE 11/01/2020 2112   BILIRUBINUR neg 12/19/2014 [place] 11/01/2020 2112   PROTEINUR 100 (A) 11/01/2020 2112   UROBILINOGEN 0.2 12/19/2014 1157   UROBILINOGEN 0.2 05/23/2014 1301   NITRITE NEGATIVE 11/01/2020 2112   LEUKOCYTESUR MODERATE (A) 11/01/2020 2112   Sepsis Labs: Invalid input(s): PROCALCITONIN, [place]  Microbiology: Recent Results (from the past 240 hour(s))  Urine Culture     Status: Abnormal   Collection Time: 11/01/20  4:18 PM   Specimen: In/Out Cath Urine  Result Value Ref Range Status   Specimen Description IN/OUT CATH URINE  Final   Special Requests   Final    Immunocompromised Performed at [facility], [address], [place], [place] [address]    Culture >=100,000 COLONIES/mL PSEUDOMONAS AERUGINOSA (A)  Final   Report Status 11/04/2020 FINAL  Final   Organism ID, Bacteria PSEUDOMONAS AERUGINOSA (A)  Final      Susceptibility   Pseudomonas aeruginosa - MIC*    CEFTAZIDIME 16 INTERMEDIATE Intermediate     CIPROFLOXACIN <=0.25 SENSITIVE Sensitive     GENTAMICIN 2 SENSITIVE Sensitive     IMIPENEM 1 SENSITIVE Sensitive     CEFEPIME RESISTANT Resistant     * >=100,000 COLONIES/mL PSEUDOMONAS AERUGINOSA  SARS CORONAVIRUS 2 (TAT 6-24 HRS) Nasopharyngeal Nasopharyngeal Swab      Status: None   Collection Time: 11/01/20 11:14 PM   Specimen: Nasopharyngeal Swab  Result Value Ref Range Status   SARS Coronavirus 2 NEGATIVE NEGATIVE Final    Comment: (NOTE) SARS-CoV-2 target nucleic acids are NOT DETECTED.  The SARS-CoV-2 RNA is generally detectable in upper and lower respiratory specimens during the acute phase of infection. Negative results do not preclude SARS-CoV-2 infection, do not rule out co-infections with other pathogens, and should not be used as the sole basis for treatment or other patient management decisions. Negative results must be combined with clinical observations, patient history, and epidemiological information. The expected result is Negative.  Fact Sheet for Patients: SugarRoll.be  Fact Sheet for Healthcare Providers: https://www.woods-mathews.com/  This test is not yet approved or cleared by the [place] FDA and  has been authorized for detection and/or diagnosis of SARS-CoV-2 by FDA under an Emergency Use Authorization (EUA). This EUA will remain  in effect (meaning this test can be used) for the duration of the COVID-19 declaration under Se ction 564(b)(1) of the Act, 21 U.S.C. section 360bbb-3(b)(1), unless the authorization is terminated or revoked sooner.  Performed at [facility], [place] [address] [place], [place] [address]     Radiology Studies: No results found.    Eryn Marandola T. [place]  If 7PM-7AM, please contact night-coverage [email or web address] 11/05/2020, 3:02 PM

## 2020-11-05 NOTE — Evaluation (Signed)
Physical Therapy Evaluation Patient Details Name: Jenna [name] MRN: [id number] DOB: 05/25/1955 Today's Date: 11/05/2020  History of Present Illness  65 y/o female presented to ED on 9/17 for constipation, dark stool, and abdominal discomfort (RLQ). CT abdomen pelvis shows mild increased attenuation in fat adjacent to R renal pelvis and gallbladder wall is thickened, fascinating adjacent to the gallbladder. Concern for possible pyelonephritis of transplanted kidney. 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  Clinical Impression  PTA, patient lives alone and reports ambulating with RW at home. Patient receives lunch from [employer] and does not cook or clean at home. She has friends that check on her but do not provide assistance. Patient guarded with conversation and does not provide information into her daily life. Patient presents with generalized weakness, impaired balance, and decreased activity tolerance. Patient ambulated 125' with min guard and RW but required x3 seated and x 3-4 standing rest breaks throughout. Patient will benefit from skilled PT services during acute stay to address listed deficits. Recommend SNF following discharge to maximize functional independence and safety prior to returning home where she will have to navigate a flight of stairs to access her apartment.        Recommendations for follow up therapy are one component of a multi-disciplinary discharge planning process, led by the attending physician.  Recommendations may be updated based on patient status, additional functional criteria and insurance authorization.  Follow Up Recommendations SNF;Other (comment) (unless family can provide 24 hour assistance)    Equipment Recommendations  3in1 (PT);Other (comment) (tub bench)     Recommendations for Other Services       Precautions / Restrictions Precautions Precautions: Fall Restrictions Weight Bearing Restrictions: No      Mobility  Bed Mobility Overal bed mobility: Needs Assistance Bed Mobility: Supine to Sit;Sit to Supine     Supine to sit: Min guard Sit to supine: Min assist   General bed mobility comments: min guard for moving to EOB. Assist to return LEs back into bed    Transfers Overall transfer level: Needs assistance Equipment used: Rolling Hodan Wurtz (2 wheeled) Transfers: Sit to/from Stand Sit to Stand: Min assist         General transfer comment: minA to power up and steady from various surfaces (bed, BSC, chair). cues for hand placement each time  Ambulation/Gait Ambulation/Gait assistance: Min guard Gait Distance (Feet): 125 Feet Assistive device: Rolling Tel Hevia (2 wheeled) Gait Pattern/deviations: Step-through pattern;Decreased stride length;Trunk flexed;Wide base of support Gait velocity: decreased Gait velocity interpretation: <1.31 ft/sec, indicative of household ambulator General Gait Details: very slow pace. Required x 3 seated and x 3-4 standing rest breaks at various distances. Min guard for safety. No LOB throughout. Reports intermittent dizziness which she reports is normal.  Science writer    Modified Rankin (Stroke Patients Only)       Balance Overall balance assessment: Needs assistance Sitting-balance support: No upper extremity supported;Feet supported Sitting balance-Leahy Scale: Fair     Standing balance support: Bilateral upper extremity supported;During functional activity Standing balance-Leahy Scale: Poor Standing balance comment: reliant on UE support and external assist  Pertinent Vitals/Pain Pain Assessment: Faces Faces Pain Scale: Hurts little more Pain Location: L hip with mobility Pain Descriptors / Indicators:  Grimacing;Guarding Pain Intervention(s): Monitored during session    Home Living Family/patient expects to be discharged to:: Private residence Living Arrangements: Alone Available Help at Discharge: Friend(s);Family;Available PRN/intermittently Type of Home: Apartment Home Access: Stairs to enter   Entrance Stairs-Number of Steps: flight   Home Equipment: Raymund Manrique - 2 wheels;Cane - single point      Prior Function Level of Independence: Needs assistance   Gait / Transfers Assistance Needed: ambulates with Mehar Sagen primarily  ADL's / Homemaking Assistance Needed: does not provide much information on ADLs but patient receives lunch from meals on wheels. She does not cook or clean.        Hand Dominance        Extremity/Trunk Assessment   Upper Extremity Assessment Upper Extremity Assessment: Generalized weakness    Lower Extremity Assessment Lower Extremity Assessment: Generalized weakness    Cervical / Trunk Assessment Cervical / Trunk Assessment: Kyphotic  Communication   Communication: No difficulties  Cognition Arousal/Alertness: Awake/alert Behavior During Therapy: WFL for tasks assessed/performed Overall Cognitive Status: Within Functional Limits for tasks assessed                                        General Comments      Exercises     Assessment/Plan    PT Assessment Patient needs continued PT services  PT Problem List Decreased strength;Decreased range of motion;Decreased activity tolerance;Decreased balance;Decreased mobility;Decreased cognition;Decreased safety awareness;Cardiopulmonary status limiting activity       PT Treatment Interventions DME instruction;Gait training;Stair training;Functional mobility training;Therapeutic activities;Therapeutic exercise;Balance training;Patient/family education    PT Goals (Current goals can be found in the Care Plan section)  Acute Rehab PT Goals Patient Stated Goal: to get stronger PT  Goal Formulation: With patient Time For Goal Achievement: 11/19/20 Potential to Achieve Goals: Fair    Frequency Min 2X/week   Barriers to discharge Decreased caregiver support;Inaccessible home environment      Co-evaluation               AM-PAC PT "6 Clicks" Mobility  Outcome Measure Help needed turning from your back to your side while in a flat bed without using bedrails?: A Little Help needed moving from lying on your back to sitting on the side of a flat bed without using bedrails?: A Little Help needed moving to and from a bed to a chair (including a wheelchair)?: A Little Help needed standing up from a chair using your arms (e.g., wheelchair or bedside chair)?: A Little Help needed to walk in hospital room?: A Little Help needed climbing 3-5 steps with a railing? : Total 6 Click Score: 16    End of Session Equipment Utilized During Treatment: Gait belt Activity Tolerance: Patient tolerated treatment well Patient left: in bed;with call bell/phone within reach;with bed alarm set Nurse Communication: Mobility status PT Visit Diagnosis: Unsteadiness on feet (R26.81);Muscle weakness (generalized) (M62.81);Other abnormalities of gait and mobility (R26.89)    Time: 5277-8242 PT Time Calculation (min) (ACUTE ONLY): 49 min   Charges:   PT Evaluation $PT Eval Moderate Complexity: 1 Mod PT Treatments $Therapeutic Activity: 23-37 mins        Gurleen Larrivee A. Gilford Rile PT, DPT Acute Rehabilitation Services Pager [phone] Office [phone]   Linna Hoff 11/05/2020, 5:11 PM

## 2020-11-05 NOTE — Care Management Important Message (Signed)
Important Message  Patient Details  Name: Jenna [name] MRN: [id number] Date of Birth: 08/07/1955   Medicare Important Message Given:  Yes     [name] [name] 11/05/2020, 11:38 AM

## 2020-11-05 NOTE — Progress Notes (Signed)
Jenna [name]   Subjective:   Interval History: This is a 65 year old lady with history end-stage renal disease status post renal transplant April 2020.  She has a history of hypertension diastolic dysfunction history of CVA presented 917 with passage of dark stools.  She has acute kidney injury with a creatinine that increased to 4.73 creatinine on 10/15/2020 was 3.3.  She has no evidence of obstruction.  Her baseline creatinine appears to be in the twos.  She is followed by Dr. Johnney Ou at [facility].  There is a recent increase of torsemide from 2 mg twice daily to 3 mg morning and 2 mg in evening.  She has a history of left upper arm AV fistula placement.  She continues on Prograf and prednisone.  Blood pressure 137/87 pulse 60 temperature 97.7 O2 sats 90% room air  Urine output 325 cc 11/04/2020  Sodium 131 potassium 4.8, 100 CO2 18 BUN 82 creatinine 4.93 glucose 92 albumin 2.7 calcium 9.4 hemoglobin 10.5  Ultrasound shows a hypoechoic mass 1.3 cm nonspecific consistent with a hemangioma.  Thickening of the gallbladder wall 1.1 cm.  CT scan shows no evidence of obstruction.  Doppler ultrasound has been ordered of her transplanted kidney.   Elevated tacrolimus level 19.  11/02/2020 and repeat do not think this is a correct reflection of a trough tacrolimus   Objective:  Vital signs in last 24 hours:  Temp:  [97.7 F (36.5 C)-97.8 F (36.6 C)] 97.7 F (36.5 C) (09/20 2012) Pulse Rate:  [59-65] 63 (09/20 2012) Resp:  [15] 15 (09/20 2012) BP: (121-140)/(73-87) 137/87 (09/20 2012) SpO2:  [99 %-100 %] 100 % (09/20 2012) Weight:  [93.6 kg] 93.6 kg (09/21 0500)  Weight change: -0.4 kg Filed Weights   11/03/20 0428 11/04/20 0300 11/05/20 0500  Weight: 93.9 kg 94 kg 93.6 kg    Intake/Output: I/O last 3 completed shifts: In: 837.6 [P.O.:468; I.V.:269.6; IV Piggyback:100] Out: 525 [Urine:525]   Intake/Output this shift:  No intake/output data  recorded.  Gen alert, no distress, obese No rash, cyanosis or gangrene Sclera anicteric, throat clear, slightly dry mouth No jvd or bruits Chest clear bilat to bases, no rales/ wheezing RRR no MRG Abd soft ntnd no mass or ascites +bs GU defer MS no joint effusions or deformity Ext 1-2+ ankle/ pretib edema bilat, no wounds or ulcers Neuro is alert, Ox 3 , nf, no asterixis LUA AVF + bruit   Basic Metabolic Panel: Recent Labs  Lab 11/01/20 1200 11/02/20 0632 11/03/20 0703 11/04/20 0352 11/05/20 0507  NA 131* 132* 132* 132* 131*  K 5.0 5.0 4.6 4.8 4.8  CL 101 99 102 101 100  CO2 19* 20* 18* 17* 18*  GLUCOSE 101* 72 77 96 92  BUN 85* 84* 82* 83* 82*  CREATININE 4.73* 4.65* 4.72* 4.72* 4.93*  CALCIUM 9.5 9.3 9.1 9.3 9.4  MG  --  2.6*  --   --   --   PHOS  --  5.5*  --  5.7*  --      Liver Function Tests: Recent Labs  Lab 11/01/20 1200 11/03/20 0703 11/04/20 0352 11/05/20 0507  AST 31 22  --  23  ALT 33 27  --  23  ALKPHOS 115 106  --  104  BILITOT 1.1 0.9  --  0.7  PROT 6.7 5.8*  --  5.9*  ALBUMIN 3.2* 2.7* 2.7* 2.7*    Recent Labs  Lab 11/01/20 1200  LIPASE 26  No results for input(s): AMMONIA in the last 168 hours.  CBC: Recent Labs  Lab 11/01/20 1200 11/02/20 0632 11/03/20 0703 11/05/20 0507  WBC 3.5* 3.2* 3.1* 3.6*  HGB 10.6* 10.7* 9.9* 10.5*  HCT 32.6* 33.2* 31.0* 32.5*  MCV 90.8 92.7 91.7 91.3  PLT 96* 99* 91* PLATELET CLUMPS NOTED ON SMEAR, UNABLE TO ESTIMATE     Cardiac Enzymes: No results for input(s): CKTOTAL, CKMB, CKMBINDEX, TROPONINI in the last 168 hours.  BNP: Invalid input(s): POCBNP  CBG: No results for input(s): GLUCAP in the last 168 hours.  Microbiology: Results for orders placed or performed during the hospital encounter of 11/01/20  Urine Culture     Status: Abnormal   Collection Time: 11/01/20  4:18 PM   Specimen: In/Out Cath Urine  Result Value Ref Range Status   Specimen Description IN/OUT CATH URINE  Final    Special Requests   Final    Immunocompromised Performed at [facility], [place] [address] [place], [place] [address]    Culture >=100,000 COLONIES/mL PSEUDOMONAS AERUGINOSA (A)  Final   Report Status 11/04/2020 FINAL  Final   Organism ID, Bacteria PSEUDOMONAS AERUGINOSA (A)  Final      Susceptibility   Pseudomonas aeruginosa - MIC*    CEFTAZIDIME 16 INTERMEDIATE Intermediate     CIPROFLOXACIN <=0.25 SENSITIVE Sensitive     GENTAMICIN 2 SENSITIVE Sensitive     IMIPENEM 1 SENSITIVE Sensitive     CEFEPIME RESISTANT Resistant     * >=100,000 COLONIES/mL PSEUDOMONAS AERUGINOSA  SARS CORONAVIRUS 2 (TAT 6-24 HRS) Nasopharyngeal Nasopharyngeal Swab     Status: None   Collection Time: 11/01/20 11:14 PM   Specimen: Nasopharyngeal Swab  Result Value Ref Range Status   SARS Coronavirus 2 NEGATIVE NEGATIVE Final    Comment: ([name]) SARS-CoV-2 target nucleic acids are NOT DETECTED.  The SARS-CoV-2 RNA is generally detectable in upper and lower respiratory specimens during the acute phase of infection. Negative results do not preclude SARS-CoV-2 infection, do not rule out co-infections with other pathogens, and should not be used as the sole basis for treatment or other patient management decisions. Negative results must be combined with clinical observations, patient history, and epidemiological information. The expected result is Negative.  Fact Sheet for Patients: SugarRoll.be  Fact Sheet for Healthcare Providers: https://www.woods-mathews.com/  This test is not yet approved or cleared by the [place] FDA and  has been authorized for detection and/or diagnosis of SARS-CoV-2 by FDA under an Emergency Use Authorization (EUA). This EUA will remain  in effect (meaning this test can be used) for the duration of the COVID-19 declaration under Se ction 564(b)(1) of the Act, 21 U.S.C. section 360bbb-3(b)(1), unless the authorization is  terminated or revoked sooner.  Performed at [facility], [place] [address] [place], [place] [address]     Coagulation Studies: No results for input(s): LABPROT, INR in the last 72 hours.  Urinalysis: No results for input(s): COLORURINE, LABSPEC, PHURINE, GLUCOSEU, HGBUR, BILIRUBINUR, KETONESUR, PROTEINUR, UROBILINOGEN, NITRITE, LEUKOCYTESUR in the last 72 hours.  Invalid input(s): APPERANCEUR     Imaging: US Renal Transplant w/Doppler  Result Date: 11/03/2020 CLINICAL DATA:  Acute kidney injury and status post prior renal transplant in 2020. EXAM: ULTRASOUND OF RENAL TRANSPLANT WITH RENAL DOPPLER ULTRASOUND TECHNIQUE: Ultrasound examination of the renal transplant was performed with gray-scale, color and duplex doppler evaluation. COMPARISON:  CT of the abdomen and pelvis without contrast on 11/01/2020. Imaging from prior transplant renal ultrasound on 07/13/2020 at  [facility] FINDINGS: Transplant kidney location: RLQ Transplant Kidney: Renal measurements: 10.6 x 4.8 x 4.2 cm = volume: 112 mL. Normal in size and parenchymal echogenicity. No evidence of mass or hydronephrosis. No peri-transplant fluid collection seen. Color flow in the main renal artery:  Yes Color flow in the main renal vein:  Yes Duplex Doppler Evaluation: Main Renal Artery Velocity: 70 cm/sec Main Renal Artery Resistive Index: 0.81 Venous waveform in main renal vein:  Present Intrarenal resistive index in upper pole:  0.61 (normal 0.6-0.8; equivocal 0.8-0.9; abnormal >= 0.9) Intrarenal resistive index in lower pole: 0.62 (normal 0.6-0.8; equivocal 0.8-0.9; abnormal >= 0.9) Bladder: Normal for degree of bladder distention. Other findings: No free fluid around the transplant kidney or focal fluid collection. IMPRESSION: Unremarkable ultrasound of a transplant kidney in the right pelvis. Electronically Signed   By: Aletta Edouard M.D.   On: 11/03/2020 16:28   MR LIVER WO CONRTAST  Result Date: 11/03/2020 CLINICAL  DATA:  History of end-stage renal disease EXAM: MRI ABDOMEN WITHOUT CONTRAST TECHNIQUE: Multiplanar multisequence MR imaging was performed without the administration of intravenous contrast. COMPARISON:  Abdominal ultrasound dated November 01, 2020 FINDINGS: Lower chest: Cardiomegaly. No pericardial effusion. Small bilateral pleural effusions and atelectasis. Hepatobiliary: Marked signal loss is seen throughout the liver on T2 imaging with increased signal on out of phase imaging. Can not evaluate hepatic lesion due to lack of IV contrast. Pancreas: No inflammatory changes or other parenchymal abnormality identified. Spleen:  Normal size.  Diffuse signal loss on T2 imaging. Adrenals/Urinary Tract: Atrophic native kidneys. No evidence of hydronephrosis. T2 hyperintense lesion of the upper pole of the left kidney, likely a simple cyst T1 hyperintense lesion at the mid region of the right kidney, likely a proteinaceous or hemorrhagic cyst, although evaluation is limited due to lack contrast. Right lower quadrant renal transplant with no evidence of hydronephrosis. Stomach/Bowel: Visualized portions within the abdomen are unremarkable. Vascular/Lymphatic: No pathologically enlarged lymph nodes identified. No abdominal aortic aneurysm demonstrated. Other:  Diffuse soft tissue anasarca and trace abdominal ascites. Musculoskeletal: Diffuse signal loss on T2 imaging. No suspicious bone lesions identified. IMPRESSION: Liver lesion which was seen on prior ultrasound is not visualized, likely due to lack of IV contrast. If multiphase contrast-enhanced exam of the liver can not be performed, follow-up ultrasound is recommended in 3-6 months to assess stability. Diffuse wall thickening of the gallbladder, likely secondary to systemic process such as heart failure. If there is clinical concern for acute cholecystitis HIDA scan could be performed. Liver and spleen demonstrate diffuse signal loss on T2 imaging, compatible with  secondary hemochromatosis. Diffuse soft tissue anasarca and trace abdominal ascites. Electronically Signed   By: Yetta Glassman M.D.   On: 11/03/2020 12:30     Medications:    meropenem (MERREM) IV 500 mg (11/04/20 2138)    citalopram  10 mg Oral Daily   gabapentin  100 mg Oral QHS   hydroxychloroquine  200 mg Oral Daily   influenza vaccine adjuvanted  0.5 mL Intramuscular Tomorrow-1000   pantoprazole (PROTONIX) IV  40 mg Intravenous Q12H   predniSONE  5 mg Oral Q lunch   rosuvastatin  5 mg Oral q1800   sodium chloride flush  3 mL Intravenous Q12H   tacrolimus  0.5 mg Oral Daily   tacrolimus  1 mg Oral QHS   [START ON 11/06/2020] valGANciclovir  450 mg Oral Once per day on Mon Thu   acetaminophen **OR** acetaminophen, ondansetron **OR** ondansetron (ZOFRAN) IV  Assessment/ Plan:  AKI on CKD IV - b/l creat is 3.0- 3.4 from July 2022, egfr 14- 80m/min.  Pt admitted w/ abd pain, dark stools and creat here is 4.7 on admit 9/17. Appears to have UTI. Imaging is w/o obstruction, UA c/w infection. Looks dry on exam, except for ankle edema which is likely R sided/ chronic issue. Suspect AKI due to intravasc hypovolemia from poor po intake w/ ongoing diuretic usage at home.  We will repeat trough tacrolimus level.  Looks like she is losing her allograft.  We will need to make preparations for receiving dialysis.  She continues on IV fluids with no clinical evidence of volume overload.  She does have some anasarca with trace abdominal ascites.  We will stop IV fluids at this point UTI/pyelo - likely of transplant kidney given UA and CT findings. On meropenem.    GIB - FOBT +, Hb 10.3, GI consulting HFrEF - EF is 20-25% from last echo. 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

## 2020-11-06 ENCOUNTER — Inpatient Hospital Stay (HOSPITAL_COMMUNITY): Payer: Medicare Other

## 2020-11-06 DIAGNOSIS — E871 Hypo-osmolality and hyponatremia: Secondary | ICD-10-CM

## 2020-11-06 DIAGNOSIS — I129 Hypertensive chronic kidney disease with stage 1 through stage 4 chronic kidney disease, or unspecified chronic kidney disease: Secondary | ICD-10-CM

## 2020-11-06 DIAGNOSIS — Z515 Encounter for palliative care: Secondary | ICD-10-CM

## 2020-11-06 DIAGNOSIS — Z7189 Other specified counseling: Secondary | ICD-10-CM | POA: Diagnosis not present

## 2020-11-06 DIAGNOSIS — R5381 Other malaise: Secondary | ICD-10-CM | POA: Diagnosis not present

## 2020-11-06 DIAGNOSIS — I2510 Atherosclerotic heart disease of native coronary artery without angina pectoris: Secondary | ICD-10-CM

## 2020-11-06 DIAGNOSIS — N12 Tubulo-interstitial nephritis, not specified as acute or chronic: Secondary | ICD-10-CM | POA: Diagnosis not present

## 2020-11-06 DIAGNOSIS — I5020 Unspecified systolic (congestive) heart failure: Secondary | ICD-10-CM | POA: Diagnosis not present

## 2020-11-06 DIAGNOSIS — T8619 Other complication of kidney transplant: Principal | ICD-10-CM

## 2020-11-06 DIAGNOSIS — N179 Acute kidney failure, unspecified: Secondary | ICD-10-CM

## 2020-11-06 DIAGNOSIS — I5023 Acute on chronic systolic (congestive) heart failure: Secondary | ICD-10-CM

## 2020-11-06 DIAGNOSIS — R160 Hepatomegaly, not elsewhere classified: Secondary | ICD-10-CM

## 2020-11-06 DIAGNOSIS — J90 Pleural effusion, not elsewhere classified: Secondary | ICD-10-CM

## 2020-11-06 LAB — HEPATIC FUNCTION PANEL
ALT: 23 U/L (ref 0–44)
AST: 29 U/L (ref 15–41)
Albumin: 2.8 g/dL — ABNORMAL LOW (ref 3.5–5.0)
Alkaline Phosphatase: 105 U/L (ref 38–126)
Bilirubin, Direct: 0.3 mg/dL — ABNORMAL HIGH (ref 0.0–0.2)
Indirect Bilirubin: 0.6 mg/dL (ref 0.3–0.9)
Total Bilirubin: 0.9 mg/dL (ref 0.3–1.2)
Total Protein: 6.1 g/dL — ABNORMAL LOW (ref 6.5–8.1)

## 2020-11-06 LAB — RENAL FUNCTION PANEL
Albumin: 2.9 g/dL — ABNORMAL LOW (ref 3.5–5.0)
Anion gap: 12 (ref 5–15)
BUN: 82 mg/dL — ABNORMAL HIGH (ref 8–23)
CO2: 18 mmol/L — ABNORMAL LOW (ref 22–32)
Calcium: 9.4 mg/dL (ref 8.9–10.3)
Chloride: 102 mmol/L (ref 98–111)
Creatinine, Ser: 4.55 mg/dL — ABNORMAL HIGH (ref 0.44–1.00)
GFR, Estimated: 10 mL/min — ABNORMAL LOW (ref >60)
Glucose, Bld: 90 mg/dL (ref 70–99)
Phosphorus: 5.5 mg/dL — ABNORMAL HIGH (ref 2.5–4.6)
Potassium: 4.6 mmol/L (ref 3.5–5.1)
Sodium: 132 mmol/L — ABNORMAL LOW (ref 135–145)

## 2020-11-06 LAB — CBC
HCT: 33.8 % — ABNORMAL LOW (ref 36.0–46.0)
Hemoglobin: 10.6 g/dL — ABNORMAL LOW (ref 12.0–15.0)
MCH: 29 pg (ref 26.0–34.0)
MCHC: 31.4 g/dL (ref 30.0–36.0)
MCV: 92.6 fL (ref 80.0–100.0)
Platelets: 86 10*3/uL — ABNORMAL LOW (ref 150–400)
RBC: 3.65 MIL/uL — ABNORMAL LOW (ref 3.87–5.11)
RDW: 21.9 % — ABNORMAL HIGH (ref 11.5–15.5)
WBC: 3.4 10*3/uL — ABNORMAL LOW (ref 4.0–10.5)
nRBC: 0.6 % — ABNORMAL HIGH (ref 0.0–0.2)

## 2020-11-06 LAB — BRAIN NATRIURETIC PEPTIDE: B Natriuretic Peptide: >4500 pg/mL — ABNORMAL HIGH (ref 0.0–100.0)

## 2020-11-06 LAB — LACTATE DEHYDROGENASE: LDH: 302 U/L — ABNORMAL HIGH (ref 98–192)

## 2020-11-06 LAB — MAGNESIUM: Magnesium: 2.5 mg/dL — ABNORMAL HIGH (ref 1.7–2.4)

## 2020-11-06 LAB — TROPONIN I (HIGH SENSITIVITY)
Troponin I (High Sensitivity): 107 ng/L (ref <18)
Troponin I (High Sensitivity): 120 ng/L (ref <18)

## 2020-11-06 MED ORDER — MELATONIN 5 MG PO TABS
5.0000 mg | ORAL_TABLET | Freq: Every day | ORAL | Status: DC
  Administered 2020-11-06 – 2020-11-11 (×6): 5 mg via ORAL
  Filled 2020-11-06 (×6): qty 1

## 2020-11-06 MED ORDER — LORAZEPAM 0.5 MG PO TABS
0.5000 mg | ORAL_TABLET | Freq: Two times a day (BID) | ORAL | Status: DC | PRN
  Administered 2020-11-06 – 2020-11-11 (×4): 0.5 mg via ORAL
  Filled 2020-11-06 (×4): qty 1

## 2020-11-06 MED ORDER — TORSEMIDE 20 MG PO TABS
20.0000 mg | ORAL_TABLET | Freq: Two times a day (BID) | ORAL | Status: DC
  Administered 2020-11-06 – 2020-11-12 (×12): 20 mg via ORAL
  Filled 2020-11-06 (×12): qty 1

## 2020-11-06 NOTE — Progress Notes (Signed)
Mobility Specialist Progress Note:   11/06/20 1500  Mobility  Activity Refused mobility  $Mobility charge 1 Mobility   Pt refused mobility d/t being too tired from OT. Says "my heart is at 20% and I was having a panic attack." Will follow up as schedule permits.  Nelta Numbers Mobility Specialist  Phone [phone]

## 2020-11-06 NOTE — Progress Notes (Signed)
Physical Therapy Treatment Patient Details Name: Jenna [name] MRN: [id number] DOB: 14-Mar-1955 Today's Date: 11/06/2020   History of Present Illness 65 y/o female presented to ED on 9/17 for constipation, dark stool, and abdominal discomfort (RLQ). CT abdomen pelvis shows mild increased attenuation in fat adjacent to R renal pelvis and gallbladder wall is thickened, fascinating adjacent to the gallbladder. Concern for possible pyelonephritis of transplanted kidney. 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    PT Comments    Provided pt with decisions in regards to amount and extent of activity she would like to perform this session as she reports she has been anxious and lethargic this date. Pt agreeable to session and initiated transfer to commode for bowel movement then gait to door and back within room with UE support on PT. Pt displays lower extremity fatigue, through instability, as she ambulated, placing her at risk for falls. She fatigues very quickly and would benefit from a rollator to provide her a place to rest with gait at home. She would greatly benefit from a short-term rehab stay at a SNF to maximize her safety and independence with all functional mobility as she does live alone, is limited in mobility, has to navigate a flight of stairs to access her home, and is at risk for falls. Educated pt on benefits of consistent and frequent activity to pt tolerance. Will continue to follow acutely.     Recommendations for follow up therapy are one component of a multi-disciplinary discharge planning process, led by the attending physician.  Recommendations may be updated based on patient status, additional functional criteria and insurance authorization.  Follow Up Recommendations  SNF;Other (comment)  (unless family can provide 24 hour assistance)     Equipment Recommendations  3in1 (PT);Other (comment) (tub bench; rollator)    Recommendations for Other Services       Precautions / Restrictions Precautions Precautions: Fall Restrictions Weight Bearing Restrictions: No     Mobility  Bed Mobility Overal bed mobility: Needs Assistance Bed Mobility: Supine to Sit;Sit to Supine     Supine to sit: Min assist;HOB elevated Sit to supine: Min assist   General bed mobility comments: Pt requesting assistance to pull trunk to sit up EOB. Assistance provided at legs for return to supine.    Transfers Overall transfer level: Needs assistance Equipment used: 1 person hand held assist Transfers: Sit to/from [facility] Sit to Stand: Min assist Stand pivot transfers: Min assist       General transfer comment: Sit to stand from EOB 2x and commode 1x with pt holding onto therapist, Garden. Stand step bed > commode with UE support and minA to steady.  Ambulation/Gait Ambulation/Gait assistance: Min assist Gait Distance (Feet): 30 Feet (x2 bouts of ~3 ft > ~30 ft) Assistive device: 1 person hand held assist Gait Pattern/deviations: Step-through pattern;Decreased stride length;Trunk flexed;Wide base of support;Shuffle Gait velocity: decreased Gait velocity interpretation: <1.31 ft/sec, indicative of household ambulator General Gait Details: After pt transferring off bedside commode after bowel movement, she stated "we can try to walk to the door and back". Pt holding onto PT anterior to her with bil UEs, displayed decreased gait speed and feet clearance.No LOB but increased knee instability without appreciative buckling noted as she fatigued, HR in 70s throughout.   Stairs  Wheelchair Mobility    Modified Rankin (Stroke Patients Only)       Balance Overall balance assessment: Needs assistance Sitting-balance support: No upper extremity  supported;Feet supported Sitting balance-Leahy Scale: Fair     Standing balance support: Bilateral upper extremity supported;During functional activity Standing balance-Leahy Scale: Poor Standing balance comment: reliant on UE support and external assist                            Cognition Arousal/Alertness: Awake/alert Behavior During Therapy: Anxious Overall Cognitive Status: Within Functional Limits for tasks assessed                                 General Comments: Pt anxious in regards to any pain or sensations, like dizziness, benefiting from calm tones throughout.      Exercises      General Comments General comments (skin integrity, edema, etc.): HR in 70s throughout; pt reported dizziness, headache,and mid-sternum pain upon returning to supine, which improved within several minutes with deep breathing, describing the discomfort as restricting her ability to expand her lungs to breathe; educated pt on importance of regular mobility and exercise while in hospital and purposes of therapists and mobility specialists. Pt agreed to this session and provided pt with ability to decide the extent of activity she wanted to do this session.      Pertinent Vitals/Pain Pain Assessment: Faces Faces Pain Scale: Hurts a little bit Pain Location: mid-sternum and headache upon returning to supine end of session, subsided in short period of time and with deep breathing Pain Descriptors / Indicators: Grimacing;Other (Comment) (restricting) Pain Intervention(s): Limited activity within patient's tolerance;Monitored during session;Repositioned    Home Living                      Prior Function            PT Goals (current goals can now be found in the care plan section) Acute Rehab PT Goals Patient Stated Goal: to get a ground floor apartment PT Goal Formulation: With patient Time For Goal Achievement: 11/19/20 Potential to Achieve Goals:  Fair Progress towards PT goals: Progressing toward goals    Frequency    Min 2X/week      PT Plan Current plan remains appropriate;Equipment recommendations need to be updated    Co-evaluation              AM-PAC PT "6 Clicks" Mobility   Outcome Measure  Help needed turning from your back to your side while in a flat bed without using bedrails?: A Little Help needed moving from lying on your back to sitting on the side of a flat bed without using bedrails?: A Little Help needed moving to and from a bed to a chair (including a wheelchair)?: A Little Help needed standing up from a chair using your arms (e.g., wheelchair or bedside chair)?: A Little Help needed to walk in hospital room?: A Little Help needed climbing 3-5 steps with a railing? : Total 6 Click Score: 16    End of Session Equipment Utilized During Treatment: Gait belt Activity Tolerance: Patient tolerated treatment well;Patient limited by fatigue;Patient limited by pain Patient left: in bed;with call bell/phone within reach;with bed alarm set Nurse Communication: Mobility status;Other (comment) (pain, bright orange/yellow diarrhea) PT Visit Diagnosis: Unsteadiness on feet (R26.81);Muscle weakness (generalized) (M62.81);Other abnormalities of gait and mobility (  R26.89);Difficulty in walking, not elsewhere classified (R26.2)     Time: 5797-2820 PT Time Calculation (min) (ACUTE ONLY): 39 min  Charges:  $Gait Training: 8-22 mins $Therapeutic Activity: 23-37 mins                     Moishe Spice, PT, DPT Acute Rehabilitation Services  Pager: [phone] Office: [place] 11/06/2020, 5:11 PM

## 2020-11-06 NOTE — Evaluation (Signed)
Occupational Therapy Evaluation Patient Details Name: Jenna [name] MRN: [id number] DOB: 05-09-1955 Today's Date: 11/06/2020   History of Present Illness 65 y/o female presented to ED on 9/17 for constipation, dark stool, and abdominal discomfort (RLQ). CT abdomen pelvis shows mild increased attenuation in fat adjacent to R renal pelvis and gallbladder wall is thickened, fascinating adjacent to the gallbladder. Concern for possible pyelonephritis of transplanted kidney. 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   Clinical Impression   Pt lives alone. She was primarily walking with a cane, but requests use of a RW this visit. She received mobile meals and has a personal care attendant 4 hours a day to assist with IADL. Pt presents with generalized weakness, decreased activity tolerance and impaired standing balance. She requires up to min assist for ADL. Will follow acutely, recommending HHOT upon discharge.      Recommendations for follow up therapy are one component of a multi-disciplinary discharge planning process, led by the attending physician.  Recommendations may be updated based on patient status, additional functional criteria and insurance authorization.   Follow Up Recommendations  Home health OT    Equipment Recommendations  Tub/shower bench    Recommendations for Other Services       Precautions / Restrictions Precautions Precautions: Fall Precaution Comments: denies recent falls      Mobility Bed Mobility               General bed mobility comments: pt seated at EOB    Transfers Overall transfer level: Needs assistance Equipment used: Rolling walker (2 wheeled) Transfers: Sit to/from Stand Sit to Stand: Min guard         General transfer comment: increased time,  cues for hand placement    Balance Overall balance assessment: Needs assistance   Sitting balance-Leahy Scale: Good     Standing balance support: Bilateral upper extremity supported;During functional activity Standing balance-Leahy Scale: Poor                             ADL either performed or assessed with clinical judgement   ADL Overall ADL's : Needs assistance/impaired Eating/Feeding: Independent;Sitting   Grooming: Wash/dry hands;Standing;Min guard   Upper Body Bathing: Minimal assistance;Sitting   Lower Body Bathing: Min guard;Sit to/from stand   Upper Body Dressing : Set up;Sitting   Lower Body Dressing: Min guard;Sit to/from stand   Toilet Transfer: Min guard;Ambulation;BSC;RW   Toileting- Water quality scientist and Hygiene: Min guard;Sit to/from stand       Functional mobility during ADLs: Min guard;Rolling walker (slow)       Vision Patient Visual Report: No change from baseline       Perception     Praxis      Pertinent Vitals/Pain Pain Assessment: No/denies pain     Hand Dominance Right   Extremity/Trunk Assessment Upper Extremity Assessment Upper Extremity Assessment: Generalized weakness   Lower Extremity Assessment Lower Extremity Assessment: Defer to PT evaluation   Cervical / Trunk Assessment Cervical / Trunk Assessment: Kyphotic   Communication Communication Communication: No difficulties   Cognition Arousal/Alertness: Awake/alert Behavior During Therapy: Flat affect Overall Cognitive Status: Impaired/Different from baseline Area of Impairment: Memory                     Memory: Decreased  short-term memory             General Comments       Exercises     Shoulder Instructions      Home Living Family/patient expects to be discharged to:: Private residence Living Arrangements: Alone Available Help at Discharge: Family;Friend(s);Personal care attendant;Available PRN/intermittently Type of Home:  Apartment Home Access: Stairs to enter Entrance Stairs-Number of Steps: flight Entrance Stairs-Rails: Right;Left Home Layout: One level     Bathroom Shower/Tub: Teacher, early years/pre: Standard     Home Equipment: Environmental consultant - 2 wheels;Cane - single point;Bedside commode          Prior Functioning/Environment Level of Independence: Needs assistance  Gait / Transfers Assistance Needed: ambulates with cane ADL's / Homemaking Assistance Needed: PCS worker does IADL, pt is able to perform self care            OT Problem List: Decreased strength;Decreased activity tolerance;Impaired balance (sitting and/or standing);Decreased knowledge of use of DME or AE;Decreased cognition      OT Treatment/Interventions: Self-care/ADL training;Therapeutic exercise;DME and/or AE instruction;Therapeutic activities;Patient/family education;Balance training    OT Goals(Current goals can be found in the care plan section) Acute Rehab OT Goals Patient Stated Goal: to get stronger OT Goal Formulation: With patient Time For Goal Achievement: 11/20/20 Potential to Achieve Goals: Good ADL Goals Pt Will Perform Grooming: with supervision;standing (at least 2 activities) Pt Will Perform Lower Body Bathing: with supervision;sit to/from stand Pt Will Perform Lower Body Dressing: with supervision;sit to/from stand Pt Will Transfer to Toilet: with supervision;ambulating;bedside commode Pt Will Perform Toileting - Clothing Manipulation and hygiene: with supervision;sit to/from stand Pt/caregiver will Perform Home Exercise Program: Increased strength;Both right and left upper extremity;With Supervision (AROM) Additional ADL Goal #1: Pt will state at least 3 energy conservation strategies as instructed.  OT Frequency: Min 2X/week   Barriers to D/C:            Co-evaluation              AM-PAC OT "6 Clicks" Daily Activity     Outcome Measure Help from another person eating meals?:  None Help from another person taking care of personal grooming?: A Little Help from another person toileting, which includes using toliet, bedpan, or urinal?: A Little Help from another person bathing (including washing, rinsing, drying)?: A Little Help from another person to put on and taking off regular upper body clothing?: None Help from another person to put on and taking off regular lower body clothing?: A Little 6 Click Score: 20   End of Session Equipment Utilized During Treatment: Rolling walker;Gait belt  Activity Tolerance: Patient limited by fatigue Patient left: in chair;with call bell/phone within reach;with chair alarm set  OT Visit Diagnosis: Unsteadiness on feet (R26.81);Other abnormalities of gait and mobility (R26.89);Muscle weakness (generalized) (M62.81)                Time: 4098-1191 OT Time Calculation (min): 29 min Charges:  OT General Charges $OT Visit: 1 Visit OT Evaluation $OT Eval Moderate Complexity: 1 Mod OT Treatments $Self Care/Home Management : 8-22 mins  Nestor Lewandowsky, OTR/L Acute Rehabilitation Services Pager: [phone] Office: [phone]  [name] 11/06/2020, 9:37 AM

## 2020-11-06 NOTE — Progress Notes (Signed)
PROGRESS NOTE  Jenna [name] ZMU:[id number] DOB: 07-06-1955   PCP: [place], Betty G, MD  Patient is from: Home.  Lives alone.  Uses walker at baseline.  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  CT on 9/17 with age-indeterminate mild associated pelviectasis, prominent right ureter without evidence of stone, and thickened gallbladder wall.  RUQ [name] on 9/17 with hyperechoic liver mass measuring about 1.3 cm, thickened gallbladder wall without evidence of stone, sludge or Murphy sign.  MRI liver on 9/19 did not confirm the liver mass but diffuse wall thickening of gallbladder and diffuse soft tissue anasarca with trace ascites.  [name] of transplanted kidney on 9/19 unremarkable.    Creatinine remains elevated. Nephrology following and concerned that she may be losing her allograft but no imminent need for HD.  Resume home torsemide.  She is also on IV meropenem for possible Pseudomonas UTI and pyelonephritis of transplanted kidney.   Subjective: Seen and examined earlier this morning.  No major events overnight.  She says something is not right with her.  She says she feels like having a panic attack.  She reports shortness of breath.  She also feels like getting up and running away.  She denies chest pain or lightheadedness.  She denies GI or UTI symptoms.  Minimal urine output.   Objective: Vitals:   11/06/20 0138 11/06/20 0453 11/06/20 1000 11/06/20 1257  BP:  (!) 138/95 132/66 135/78  Pulse:  66 70 72  Resp:  20 16 19   Temp:  98.1 F (36.7 C) 97.6 F (36.4 C) (!) 97.4 F (36.3 C)  TempSrc:  Oral Oral Oral  SpO2:  99% 97% 96%  Weight: 93.5 kg     Height:         Intake/Output Summary (Last 24 hours) at 11/06/2020 1428 Last data filed at 11/06/2020 1305 Gross per 24 hour  Intake 1526.66 ml  Output 150 ml  Net 1376.66 ml   Filed Weights   11/04/20 0300 11/05/20 0500 11/06/20 0138  Weight: 94 kg 93.6 kg 93.5 kg    Examination:  GENERAL: No apparent distress.  Nontoxic. HEENT: MMM.  Vision and hearing grossly intact.  NECK: Supple.  No apparent JVD.  RESP: On RA.  No IWOB.  Diminished aeration bilaterally. CVS:  RRR. Heart sounds normal.  ABD/GI/GU: BS+. Abd soft, NTND.  MSK/EXT:  Moves extremities. No apparent deformity. No edema.  SKIN: no apparent skin lesion or wound NEURO: Awake and alert. Oriented appropriately.  No apparent focal neuro deficit. PSYCH: Calm. Normal affect.     Procedures:  None  Microbiology summarized: COVID-19 and influenza PCR nonreactive. 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  Concern about losing her allograft but no imminent need for HD yet.11/08/20 Recent Labs    08/22/20 0542 08/23/20 0149 08/26/20 0626 10/15/20 1218 11/01/20 1200 11/02/20 11/04/20 11/03/20 0703 11/04/20 0352 11/05/20 0507 11/06/20 0439  BUN 58* 56* 49* 56* 85* 84* 82* 83* 82* 82*  CREATININE 3.30* 3.18* 3.05* 3.33* 4.73* 4.65* 4.72* 4.72* 4.93* 4.55*  -Nephrology managing-restarted torsemide today -Tac level elevated to 18 but nephrology repeating -Immunosuppressives per  nephrology  Pseudomonas UTI/pyelonephritis of transplanted kidney-no convincing radiologic or clinical evidence but she has transplanted kidney.  -Continue IV meropenem  Positive Hemoccult/melena: H&H stable. Recent Labs    07/12/20 1148 07/30/20 1330 08/21/20 1053 08/22/20 0542 10/15/20 1218 11/01/20 1200 11/02/20 0632 11/03/20 0703 11/05/20 0507 11/06/20 0439  HGB 8.3* 8.3 Repeated and verified X2.* 8.6* 9.3* 10.3* 10.6* 10.7* 9.9* 10.5* 10.6*  -GI recommended PPI  twice daily, and outpatient follow-up for possible EGD -Continue monitoring  Acute on chronic systolic CHF: TTE with LVEF of 20 to 25%.  Follow[facility] at [facility].  Now with SOB, DOE, anasarca and large right-sided pleural effusion.   -Torsemide resumed by nephrology -Consider resuming BiDil at lower dose if BP stable with torsemide. -Continue holding Toprol-XL in the setting of borderline bradycardia -Monitor fluid status  Large right-sided pleural effusion-likely due CHF and renal failure. -US-guided thoracocentesis-diagnostic and therapeutic.   CAD s/p STEMI, DES to LAD 9/20-no chest pain. -Continue rosuvastatin -Toprol XL on hold  Paroxysmal A. fib: Currently in sinus rhythm.  Rate controlled.  Not on AC due to GI bleed. -Continue telemetry monitoring   Pancytopenia-relatively stable. -Continue monitoring while on meropenem  Hypervolemic hyponatremia-likely due to CHF and renal failure. -Management as above   Liver mass?  Noted on [name] but cannot confirm on MRI although MRI was without contrast. -Repeat imaging in about 3 months   History of CMV viremia -Continue Valcyte   Gallbladder wall thickening-likely due to fluid overload from CHF and renal failure -Management as above.   Right ankle tenderness-seems to have resolved.  Generalized weakness/physical deconditioning-lives alone.  Uses walker at baseline. -PT/OT  Anxiety and depression-feels like having a panic attack.  Could be due to anasarca/pleural effusion/CHF -Manage CHF, effusion and anasarca as above. -Discussed about bag breathing -Low-dose Ativan as needed -Continue home low-dose gabapentin.  Goal of care counseling-patient with significant comorbidity as above.  Really concerning long-term prognosis.  Patient, patient's daughter and brother prefers to continue full code with full scope of care.  I doubt CPR or intubation are to her best interest given here comorbidity if we happen to be in the  situation. -Consulted palliative care.  Class I obesity Body mass index is 32.28 kg/m.         DVT prophylaxis:  SCDs Start: 11/01/20 2317  Code Status: Full code Family Communication: Updated patient's daughter and patient's brother over the phone. Level of care: Telemetry Medical Status is: Inpatient  Remains inpatient appropriate because:Ongoing diagnostic testing needed not appropriate for outpatient work up, Unsafe d/c plan, and Inpatient level of care appropriate due to severity of illness  Dispo: The patient is from: Home              Anticipated d/c is to:  SNF              Patient currently is not medically stable to d/c.   Difficult to place patient No       Consultants:  Nephrology Gastroenterology Palliative medicine   Sch Meds:  Scheduled Meds:  citalopram  10 mg Oral Daily   gabapentin  100 mg Oral QHS   hydroxychloroquine  200 mg Oral Daily   influenza vaccine adjuvanted  0.5 mL Intramuscular Tomorrow-1000   pantoprazole (PROTONIX) IV  40 mg Intravenous Q12H   predniSONE  5 mg Oral Q lunch   rosuvastatin  5 mg Oral q1800   sodium chloride flush  3 mL Intravenous Q12H   tacrolimus  0.5 mg  Oral Daily   tacrolimus  1 mg Oral QHS   torsemide  20 mg Oral BID   valGANciclovir  450 mg Oral Once per day on Mon Thu   Continuous Infusions:  sodium chloride Stopped (11/06/20 0007)   meropenem (MERREM) IV 500 mg (11/06/20 1102)   PRN Meds:.sodium chloride, acetaminophen **OR** acetaminophen, ondansetron **OR** ondansetron (ZOFRAN) IV  Antimicrobials: Anti-infectives (From admission, onward)    Start     Dose/Rate Route Frequency Ordered Stop   11/06/20 0900  valGANciclovir (VALCYTE) 450 MG tablet TABS 450 mg        450 mg Oral Once per day on Mon Thu 11/04/20 1046     11/04/20 1015  meropenem (MERREM) 500 mg in sodium chloride 0.9 % 100 mL IVPB        500 mg 200 mL/hr over 30 Minutes Intravenous Every 12 hours 11/04/20 0925     11/03/20 1400   ceFEPIme (MAXIPIME) 1 g in sodium chloride 0.9 % 100 mL IVPB  Status:  Discontinued        1 g 200 mL/hr over 30 Minutes Intravenous Every 24 hours 11/03/20 1258 11/04/20 0925   11/03/20 1000  valGANciclovir (VALCYTE) 450 MG tablet TABS 450 mg  Status:  Discontinued        450 mg Oral Every other day 11/01/20 2320 11/04/20 1046   11/02/20 2200  cefTRIAXone (ROCEPHIN) 1 g in sodium chloride 0.9 % 100 mL IVPB  Status:  Discontinued        1 g 200 mL/hr over 30 Minutes Intravenous Daily at bedtime 11/01/20 2318 11/03/20 1258   11/02/20 1030  hydroxychloroquine (PLAQUENIL) tablet 200 mg        200 mg Oral Daily 11/01/20 2320     11/01/20 2200  cefTRIAXone (ROCEPHIN) 1 g in sodium chloride 0.9 % 100 mL IVPB        1 g 200 mL/hr over 30 Minutes Intravenous  Once 11/01/20 2145 11/01/20 2230        I have personally reviewed the following labs and images: CBC: Recent Labs  Lab 11/01/20 1200 11/02/20 0632 11/03/20 0703 11/05/20 0507 11/06/20 0439  WBC 3.5* 3.2* 3.1* 3.6* 3.4*  HGB 10.6* 10.7* 9.9* 10.5* 10.6*  HCT 32.6* 33.2* 31.0* 32.5* 33.8*  MCV 90.8 92.7 91.7 91.3 92.6  PLT 96* 99* 91* PLATELET CLUMPS NOTED ON SMEAR, UNABLE TO ESTIMATE 86*   BMP &GFR Recent Labs  Lab 11/02/20 0632 11/03/20 0703 11/04/20 0352 11/05/20 0507 11/06/20 0439  NA 132* 132* 132* 131* 132*  K 5.0 4.6 4.8 4.8 4.6  CL 99 102 101 100 102  CO2 20* 18* 17* 18* 18*  GLUCOSE 72 77 96 92 90  BUN 84* 82* 83* 82* 82*  CREATININE 4.65* 4.72* 4.72* 4.93* 4.55*  CALCIUM 9.3 9.1 9.3 9.4 9.4  MG 2.6*  --   --   --  2.5*  PHOS 5.5*  --  5.7*  --  5.5*   Estimated Creatinine Clearance: 14.5 mL/min (A) (by C-G formula based on SCr of 4.55 mg/dL (H)). Liver & Pancreas: Recent Labs  Lab 11/01/20 1200 11/03/20 0703 11/04/20 0352 11/05/20 0507 11/06/20 0439  AST 31 22  --  23 29  ALT 33 27  --  23 23  ALKPHOS 115 106  --  104 105  BILITOT 1.1 0.9  --  0.7 0.9  PROT 6.7 5.8*  --  5.9* 6.1*  ALBUMIN 3.2*  2.7* 2.7* 2.7* 2.8*  2.9*  Recent Labs  Lab 11/01/20 1200  LIPASE 26   No results for input(s): AMMONIA in the last 168 hours. Diabetic: No results for input(s): HGBA1C in the last 72 hours. No results for input(s): GLUCAP in the last 168 hours. Cardiac Enzymes: No results for input(s): CKTOTAL, CKMB, CKMBINDEX, TROPONINI in the last 168 hours. No results for input(s): PROBNP in the last 8760 hours. Coagulation Profile: No results for input(s): INR, PROTIME in the last 168 hours. Thyroid Function Tests: No results for input(s): TSH, T4TOTAL, FREET4, T3FREE, THYROIDAB in the last 72 hours. Lipid Profile: No results for input(s): CHOL, HDL, LDLCALC, TRIG, CHOLHDL, LDLDIRECT in the last 72 hours. Anemia Panel: No results for input(s): VITAMINB12, FOLATE, FERRITIN, TIBC, IRON, RETICCTPCT in the last 72 hours. Urine analysis:    Component Value Date/Time   COLORURINE YELLOW 11/01/2020 2112   APPEARANCEUR CLOUDY (A) 11/01/2020 2112   LABSPEC 1.025 11/01/2020 2112   PHURINE 6.0 11/01/2020 2112   GLUCOSEU NEGATIVE 11/01/2020 2112   HGBUR NEGATIVE 11/01/2020 2112   BILIRUBINUR NEGATIVE 11/01/2020 2112   BILIRUBINUR neg 12/19/2014 [place] 11/01/2020 2112   PROTEINUR 100 (A) 11/01/2020 2112   UROBILINOGEN 0.2 12/19/2014 1157   UROBILINOGEN 0.2 05/23/2014 1301   NITRITE NEGATIVE 11/01/2020 2112   LEUKOCYTESUR MODERATE (A) 11/01/2020 2112   Sepsis Labs: Invalid input(s): PROCALCITONIN, [place]  Microbiology: Recent Results (from the past 240 hour(s))  Urine Culture     Status: Abnormal   Collection Time: 11/01/20  4:18 PM   Specimen: In/Out Cath Urine  Result Value Ref Range Status   Specimen Description IN/OUT CATH URINE  Final   Special Requests   Final    Immunocompromised Performed at [facility], [address], [place], [place] [address]    Culture >=100,000 COLONIES/mL PSEUDOMONAS AERUGINOSA (A)  Final   Report Status 11/04/2020 FINAL   Final   Organism ID, Bacteria PSEUDOMONAS AERUGINOSA (A)  Final      Susceptibility   Pseudomonas aeruginosa - MIC*    CEFTAZIDIME 16 INTERMEDIATE Intermediate     CIPROFLOXACIN <=0.25 SENSITIVE Sensitive     GENTAMICIN 2 SENSITIVE Sensitive     IMIPENEM 1 SENSITIVE Sensitive     CEFEPIME RESISTANT Resistant     * >=100,000 COLONIES/mL PSEUDOMONAS AERUGINOSA  SARS CORONAVIRUS 2 (TAT 6-24 HRS) Nasopharyngeal Nasopharyngeal Swab     Status: None   Collection Time: 11/01/20 11:14 PM   Specimen: Nasopharyngeal Swab  Result Value Ref Range Status   SARS Coronavirus 2 NEGATIVE NEGATIVE Final    Comment: (NOTE) SARS-CoV-2 target nucleic acids are NOT DETECTED.  The SARS-CoV-2 RNA is generally detectable in upper and lower respiratory specimens during the acute phase of infection. Negative results do not preclude SARS-CoV-2 infection, do not rule out co-infections with other pathogens, and should not be used as the sole basis for treatment or other patient management decisions. Negative results must be combined with clinical observations, patient history, and epidemiological information. The expected result is Negative.  Fact Sheet for Patients: SugarRoll.be  Fact Sheet for Healthcare Providers: https://www.woods-mathews.com/  This test is not yet approved or cleared by the [place] FDA and  has been authorized for detection and/or diagnosis of SARS-CoV-2 by FDA under an Emergency Use Authorization (EUA). This EUA will remain  in effect (meaning this test can be used) for the duration of the COVID-19 declaration under Se ction 564(b)(1) of the Act, 21 U.S.C. section 360bbb-3(b)(1), unless the authorization is terminated or revoked  sooner.  Performed at [facility], [place] [address], [place], [place] [address]     Radiology Studies: DG Chest 2 View  Result Date: 11/06/2020 CLINICAL DATA:  Shortness of breath EXAM: CHEST - 2  VIEW COMPARISON:  11/01/2020 FINDINGS: Moderate to large right pleural effusion and associated atelectasis or consolidation, increased compared to prior examination. Mild diffuse interstitial pulmonary opacity. Unchanged gross cardiomegaly. IMPRESSION: 1. Moderate to large right pleural effusion and associated atelectasis or consolidation, increased compared to prior examination. 2. Cardiomegaly and diffuse bilateral interstitial pulmonary opacity, likely edema. Electronically Signed   By: Eddie Candle M.D.   On: 11/06/2020 13:04      Josemanuel Eakins T[address] [place]  If 7PM-7AM, please contact night-coverage [email or web address] 11/06/2020, 2:28 PM

## 2020-11-06 NOTE — Progress Notes (Signed)
Patient requests something to help her relax and sleep because she says she is felling anxious. MD notified.

## 2020-11-06 NOTE — Progress Notes (Signed)
[facility] ROUNDING NOTE   Subjective:   Interval History: This is a 65 year old lady with history end-stage renal disease status post renal transplant April 2020.  She has a history of hypertension diastolic dysfunction history of CVA presented 917 with passage of dark stools.  She has acute kidney injury with a creatinine that increased to 4.73 creatinine on 10/15/2020 was 3.3.  She has no evidence of obstruction.  Her baseline creatinine appears to be in the twos.  She is followed by Dr. Johnney Ou at [facility].  She has a history of left upper arm AV fistula placement.  She continues on Prograf and prednisone.  We will restart torsemide 20 mg twice daily.  We will check a chest x-ray  Blood pressure 138/94 pulse 87 temperature 98.1 O2 sats 99% room air  Urine output 150 cc 11/06/2020  Sodium 132 potassium 4.6 chloride 102 CO2 18 BUN 82 creatinine 4.55 glucose 90 CO2 18 phosphorus 5.5 calcium 9.4 albumin 2.9 hemoglobin 10.6  Ultrasound shows a hypoechoic mass 1.3 cm nonspecific consistent with a hemangioma.  Thickening of the gallbladder wall 1.1 cm.  CT scan shows no evidence of obstruction.  Doppler ultrasound has been ordered of her transplanted kidney.   Elevated tacrolimus level 19.  11/02/2020 and repeat do not think this is a correct reflection of a trough tacrolimus   Objective:  Vital signs in last 24 hours:  Temp:  [97.8 F (36.6 C)-98.5 F (36.9 C)] 98.1 F (36.7 C) (09/22 0453) Pulse Rate:  [64-67] 66 (09/22 0453) Resp:  [18-20] 20 (09/22 0453) BP: (134-138)/(68-95) 138/95 (09/22 0453) SpO2:  [95 %-100 %] 99 % (09/22 0453) Weight:  [93.5 kg] 93.5 kg (09/22 0138)  Weight change: -0.114 kg Filed Weights   11/04/20 0300 11/05/20 0500 11/06/20 0138  Weight: 94 kg 93.6 kg 93.5 kg    Intake/Output: I/O last 3 completed shifts: In: 1283.7 [P.O.:1074; I.V.:1.9; IV Piggyback:207.8] Out: 150 [Urine:150]   Intake/Output this shift:  No  intake/output data recorded.  Gen alert, no distress, obese No rash, cyanosis or gangrene Sclera anicteric, throat clear, slightly dry mouth No jvd or bruits Chest clear bilat to bases, no rales/ wheezing RRR no MRG Abd soft ntnd no mass or ascites +bs GU defer MS no joint effusions or deformity Ext 1-2+ ankle/ pretib edema bilat, no wounds or ulcers Neuro is alert, Ox 3 , nf, no asterixis LUA AVF + bruit   Basic Metabolic Panel: Recent Labs  Lab 11/02/20 0632 11/03/20 0703 11/04/20 0352 11/05/20 0507 11/06/20 0439  NA 132* 132* 132* 131* 132*  K 5.0 4.6 4.8 4.8 4.6  CL 99 102 101 100 102  CO2 20* 18* 17* 18* 18*  GLUCOSE 72 77 96 92 90  BUN 84* 82* 83* 82* 82*  CREATININE 4.65* 4.72* 4.72* 4.93* 4.55*  CALCIUM 9.3 9.1 9.3 9.4 9.4  MG 2.6*  --   --   --  2.5*  PHOS 5.5*  --  5.7*  --  5.5*     Liver Function Tests: Recent Labs  Lab 11/01/20 1200 11/03/20 0703 11/04/20 0352 11/05/20 0507 11/06/20 0439  AST 31 22  --  23 29  ALT 33 27  --  23 23  ALKPHOS 115 106  --  104 105  BILITOT 1.1 0.9  --  0.7 0.9  PROT 6.7 5.8*  --  5.9* 6.1*  ALBUMIN 3.2* 2.7* 2.7* 2.7* 2.8*  2.9*    Recent Labs  Lab 11/01/20  1200  LIPASE 26    No results for input(s): AMMONIA in the last 168 hours.  CBC: Recent Labs  Lab 11/01/20 1200 11/02/20 0632 11/03/20 0703 11/05/20 0507 11/06/20 0439  WBC 3.5* 3.2* 3.1* 3.6* 3.4*  HGB 10.6* 10.7* 9.9* 10.5* 10.6*  HCT 32.6* 33.2* 31.0* 32.5* 33.8*  MCV 90.8 92.7 91.7 91.3 92.6  PLT 96* 99* 91* PLATELET CLUMPS NOTED ON SMEAR, UNABLE TO ESTIMATE 86*     Cardiac Enzymes: No results for input(s): CKTOTAL, CKMB, CKMBINDEX, TROPONINI in the last 168 hours.  BNP: Invalid input(s): POCBNP  CBG: No results for input(s): GLUCAP in the last 168 hours.  Microbiology: Results for orders placed or performed during the hospital encounter of 11/01/20  Urine Culture     Status: Abnormal   Collection Time: 11/01/20  4:18 PM    Specimen: In/Out Cath Urine  Result Value Ref Range Status   Specimen Description IN/OUT CATH URINE  Final   Special Requests   Final    Immunocompromised Performed at [facility], [place] [address], [place], [place] [address]    Culture >=100,000 COLONIES/mL PSEUDOMONAS AERUGINOSA (A)  Final   Report Status 11/04/2020 FINAL  Final   Organism ID, Bacteria PSEUDOMONAS AERUGINOSA (A)  Final      Susceptibility   Pseudomonas aeruginosa - MIC*    CEFTAZIDIME 16 INTERMEDIATE Intermediate     CIPROFLOXACIN <=0.25 SENSITIVE Sensitive     GENTAMICIN 2 SENSITIVE Sensitive     IMIPENEM 1 SENSITIVE Sensitive     CEFEPIME RESISTANT Resistant     * >=100,000 COLONIES/mL PSEUDOMONAS AERUGINOSA  SARS CORONAVIRUS 2 (TAT 6-24 HRS) Nasopharyngeal Nasopharyngeal Swab     Status: None   Collection Time: 11/01/20 11:14 PM   Specimen: Nasopharyngeal Swab  Result Value Ref Range Status   SARS Coronavirus 2 NEGATIVE NEGATIVE Final    Comment: (NOTE) SARS-CoV-2 target nucleic acids are NOT DETECTED.  The SARS-CoV-2 RNA is generally detectable in upper and lower respiratory specimens during the acute phase of infection. Negative results do not preclude SARS-CoV-2 infection, do not rule out co-infections with other pathogens, and should not be used as the sole basis for treatment or other patient management decisions. Negative results must be combined with clinical observations, patient history, and epidemiological information. The expected result is Negative.  Fact Sheet for Patients: SugarRoll.be  Fact Sheet for Healthcare Providers: https://www.woods-mathews.com/  This test is not yet approved or cleared by the [place] FDA and  has been authorized for detection and/or diagnosis of SARS-CoV-2 by FDA under an Emergency Use Authorization (EUA). This EUA will remain  in effect (meaning this test can be used) for the duration of the COVID-19  declaration under Se ction 564(b)(1) of the Act, 21 U.S.C. section 360bbb-3(b)(1), unless the authorization is terminated or revoked sooner.  Performed at [facility], [place] [address], [place], [place] [address]     Coagulation Studies: No results for input(s): LABPROT, INR in the last 72 hours.  Urinalysis: No results for input(s): COLORURINE, LABSPEC, PHURINE, GLUCOSEU, HGBUR, BILIRUBINUR, KETONESUR, PROTEINUR, UROBILINOGEN, NITRITE, LEUKOCYTESUR in the last 72 hours.  Invalid input(s): APPERANCEUR     Imaging: No results found.   Medications:    sodium chloride Stopped (11/06/20 0007)   meropenem (MERREM) IV Stopped (11/05/20 2356)    citalopram  10 mg Oral Daily   gabapentin  100 mg Oral QHS   hydroxychloroquine  200 mg Oral Daily   influenza vaccine adjuvanted  0.5  mL Intramuscular Tomorrow-1000   pantoprazole (PROTONIX) IV  40 mg Intravenous Q12H   predniSONE  5 mg Oral Q lunch   rosuvastatin  5 mg Oral q1800   sodium chloride flush  3 mL Intravenous Q12H   tacrolimus  0.5 mg Oral Daily   tacrolimus  1 mg Oral QHS   valGANciclovir  450 mg Oral Once per day on Mon Thu   sodium chloride, acetaminophen **OR** acetaminophen, ondansetron **OR** ondansetron (ZOFRAN) IV  Assessment/ Plan:  AKI on CKD IV - b/l creat is 3.0- 3.4 from July 2022, egfr 14- 32m/min.  Pt admitted w/ abd pain, dark stools and creat here is 4.7 on admit 9/17. Appears to have UTI. Imaging is w/o obstruction, UA c/w infection. Looks dry on exam, except for ankle edema which is likely R sided/ chronic issue. Suspect AKI due to intravasc hypovolemia from poor po intake w/ ongoing diuretic usage at home.  We will repeat trough tacrolimus level.  Looks like she is losing her allograft.  We will need to make preparations for receiving dialysis.  She continues on IV fluids with no clinical evidence of volume overload.  She does have some anasarca with trace abdominal ascites.  We will stop IV fluids  at this point.  There were no imminent need for dialysis. UTI/pyelo - likely of transplant kidney given UA and CT findings. On meropenem.    GIB - FOBT +, Hb 10.3, GI consulting HFrEF - EF is 20-25% from last echo. 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

## 2020-11-06 NOTE — Progress Notes (Signed)
Patient says her right ear is bleeding, RN checked and did not see any blood, but did see that q tips on the bedside table. Right ear did look a little swollen, but face is edematous also. She said her ear was irritating her. Will continue to monitor. RN let patient know that a note will be placed for her care team to review.

## 2020-11-06 NOTE — Consult Note (Signed)
Palliative Care Consult Note                                  Date: 11/06/2020   Jenna [name] Name: Jenna [name]  DOB: [birth year]  MRN: [id number]  Age / Sex: 65 y.o., female  PCP: [place], Betty G, MD Referring Physician: Almon Hercules, MD  Reason for Consultation: Establishing goals of care  HPI/Jenna [name] Profile: Palliative Care consult requested for goals of care discussion in this 65 y.o. female  with past medical history of ESRD s/p DDKT (2020), sCHF, EF 20-25%, CAD, STEMI s/p DES to LAD (10/2018), atrial fibrillation, CMV, AICD, anxiety, and depression. Jenna [name] was admitted on 11/01/2020 from home with AKI on CKD IV, UTI, and Melena. During work-up BUN 85, creatinine 4.73, GFR 10, potassium 5.0. Jenna [name] is being followed by Nephrology for potential need for dialysis.   Past Medical History:  Diagnosis Date   Anemia    Anxiety    panic attack- talks herself and takes deep breathes   CAD (coronary artery disease)    a. STEMI 10/2018 s/p DES to LAD.   Depression    Dyspnea    with exertion    ESRD (end stage renal disease) ([name])    hemo TTHSAT   Essential hypertension    FSGS (focal segmental glomerulosclerosis) 2011   By renal biopsy   Head injury    age 39   HFrEF (heart failure with reduced ejection fraction) ([name])    History of kidney stones    kidney stone   Hx of lupus nephritis 2011   by renal biopsy   Ischemic cardiomyopathy    Kidney transplant recipient    LA thrombus 10/2018   Lupus (systemic lupus erythematosus) ([name])    followed by Dr. Dierdre Forth   Obesity    Orthostatic hypotension    PAF (paroxysmal atrial fibrillation) ([name])    Parietal lobe infarction ([name])    a. remote parietal infarct on brain MRI 12/2019.   Pericarditis    age 71ish   Renal stone    Secondary hyperparathyroidism of renal origin ([name])    SLE (systemic lupus erythematosus) ([name])    TIA (transient ischemic attack)    no residual effects      Subjective:   This NP Royal Hawthorn reviewed medical records, received report from team, assessed the Jenna [name] and then met at the Jenna [name]'s bedside with Jenna [name] to discuss diagnosis, prognosis, GOC, EOL wishes disposition and options.  Jenna [name] is sitting up in the recliner watching tv. No acute distress noted. States Jenna [name] feels bad today, fatigued.    Concept of Palliative Care was introduced as specialized medical care for people and their families living with serious illness.  It focuses on providing relief from the symptoms and stress of a serious illness.  The goal is to improve quality of life for both the Jenna [name] and the family. Values and goals of care important to Jenna [name] and family were attempted to be elicited.  I created space and opportunity for Jenna [name] to explore state of health prior to admission, thoughts, and feelings. Jenna [name] [name] that Jenna [name] lives in the home alone. Prior to admission Jenna [name] has noticed a decrease in Jenna [name] appetite reporting approximately 10-15lb weigh loss over the past 6 months. Jenna [name] utilizes [employer] on [employer]. Jenna [name] is able to drive herself to appointments. Jenna [name] Jenna [name] has Jenna [name] groceries delivered to Jenna [name] home. Jenna [name] utilizes a cane  or walker in the home.   Jenna [name] Jenna [name] is receiving CAPS services however their first day was this week and unfortunately Jenna [name] has been hospitalized.   We discussed Jenna [name] current illness and what it means in the larger context of Jenna [name] on-going co-morbidities. Natural disease trajectory and expectations were discussed.  Jenna [name] verbalized understanding of current illness and co-morbidities. Jenna [name] [name] Jenna [name] frustration with Jenna [name] renal failure. We spoke about possible re-start to dialysis.   Jenna [name] states Jenna [name] is not planning to re-start dialysis and speaks to Jenna [name] frustration around this discussion. Jenna [name] [name] Jenna [name] Nephrology team [facility] at [facility]. Jenna [name] and Jenna [name] Jenna [name] wishes Jenna [name] could be transferred there as Jenna [name] is most comfortable  hearing from providers that know Jenna [name] and Jenna [name] conditions. Attempted to explain that our Nephrologist are able to see previous care as we are on similar computer systems. Jenna [name] verbalized understanding however still expressing Jenna [name] is not planning to restart dialysis.   Jenna [name] [name] Jenna [name] is having anxiety being in the hospital and the fear of unknown in addition to not seeing Jenna [name] normal providers. Education provided on PRN ativan in addition to non-pharmacological interventions such as deep breathing and guided imagery. Jenna [name] is appreciative. Also asking to speak with spiritual support team.   I discussed the importance of continued conversation with family and their medical providers regarding overall plan of care and treatment options, ensuring decisions are within the context of the patients values and GOCs.  Questions and concerns were addressed.  Jenna [name] was encouraged to call with questions or concerns.  PMT will continue to support holistically as needed.   Life Review: Jenna [name] lives in the home alone. CAPS support. Jenna [name] is widowed. Has 1 Jenna [name] who lives in [place] and is pregnant with Jenna [name]'s first grandchild. 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 Enjoys reading and crocheting. Is of [employer].    Objective:   Primary Diagnoses: Present on Admission:  Acute renal failure superimposed on stage 4 chronic kidney disease ([name])  Paroxysmal atrial fibrillation ([name])  Lupus (systemic lupus erythematosus) ([name])  Hypertension with renal disease  HFrEF (heart failure with reduced ejection fraction) ([name])  Dyslipidemia  CAD (coronary artery disease)  Anxiety disorder  Pyelonephritis of transplanted kidney  Thrombocytopenia ([name])  Liver mass  AKI (acute kidney injury) ([facility])   Scheduled Meds:  citalopram  10 mg Oral Daily   gabapentin  100 mg Oral QHS   hydroxychloroquine  200 mg Oral Daily   influenza vaccine adjuvanted  0.5 mL Intramuscular Tomorrow-1000   melatonin  5 mg Oral  QHS   pantoprazole (PROTONIX) IV  40 mg Intravenous Q12H   predniSONE  5 mg Oral Q lunch   rosuvastatin  5 mg Oral q1800   sodium chloride flush  3 mL Intravenous Q12H   tacrolimus  0.5 mg Oral Daily   tacrolimus  1 mg Oral QHS   torsemide  20 mg Oral BID   valGANciclovir  450 mg Oral Once per day on Mon Thu    Continuous Infusions:  sodium chloride Stopped (11/06/20 0007)   meropenem (MERREM) IV 500 mg (11/06/20 1102)    PRN Meds: sodium chloride, acetaminophen **OR** acetaminophen, LORazepam, ondansetron **OR** ondansetron (ZOFRAN) IV  Allergies  Allergen Reactions   Enalapril Maleate Anaphylaxis, Swelling and Other (See Comments)    Throat swells   Penicillins Other (See Comments)    Made Jenna [name] lightheaded Jenna [name] HAS HAD A PCN REACTION WITH IMMEDIATE RASH, FACIAL/TONGUE/THROAT SWELLING, SOB, OR LIGHTHEADEDNESS WITH HYPOTENSION:  #  #  YES  #  #  Has Jenna [name] had a PCN reaction causing severe rash involving mucus membranes or skin necrosis: No Has Jenna [name] had a PCN reaction that required hospitalization: No Has Jenna [name] had a PCN reaction occurring within the last 10 years: No If all of the above answers are "NO", then may proceed with Cephalosporin use.    Chocolate Nausea And Vomiting   Tape Itching, Rash and Other (See Comments)    Review of Systems  Cardiovascular:  Positive for leg swelling.  Neurological:  Positive for weakness.  Unless otherwise noted, a complete review of systems is negative.  Physical Exam General: NAD Cardiovascular: regular rate and rhythm Pulmonary: diminished bilaterally  Abdomen: soft, nontender, + bowel sounds Extremities: bilateral lower extremity edema, no joint deformities Skin: no rashes, warm and dry Neurological: AAO x3, mood appropriate   Vital Signs:  BP 135/78 (BP Location: Right Wrist)   Pulse 72   Temp (!) 97.4 F (36.3 C) (Oral)   Resp 19   Ht _0  (1.702 m)   Wt 93.5 kg Comment: scale a  SpO2 96%   BMI 32.28  kg/m  Pain Scale: 0-10   Pain Score: 3   SpO2: SpO2: 96 % O2 Device:SpO2: 96 % O2 Flow Rate: .   IO: Intake/output summary:  Intake/Output Summary (Last 24 hours) at 11/06/2020 1440 Last data filed at 11/06/2020 1305 Gross per 24 hour  Intake 1408.66 ml  Output 150 ml  Net 1258.66 ml    LBM: Last BM Date: 11/06/20 Baseline Weight: Weight: 90 kg Most recent weight: Weight: 93.5 kg (scale a)      Palliative Assessment/Data: PPS 30-40%   Advanced Care Planning:   Primary Decision Maker: NEXT OF KIN  Code Status/Advance Care Planning: Full code  A discussion was had today regarding advanced directives. Concepts specific to code status, artifical feeding and hydration, continued IV antibiotics and rehospitalization was had.    Jenna [name] Jenna [name] Jenna [name], Jenna [name] is Jenna [name] medical decision maker.   I discussed at length Jenna [name]'s current full code status with consideration of current illness and co-morbidities. Jenna [name] verbalized understanding stating wishes to remain a full code and that Jenna [name] and Jenna [name] Jenna [name] have discussed and are both on the same page.   Palliative Care services outpatient were explained and offered. Jenna [name] verbalized understanding and awareness of palliative's goals and philosophy of care. Jenna [name] wishes to further consider for outpatient support but does request continued inpatient support.   Decisions/Changes to ACP: Full Code/Full Scope Jenna [name] states Jenna [name] is not restarting hemodialysis and if required would wish to be transferred to [facility] where Jenna [name] medical team is.  Jenna [name], Jenna is HCPOA.   Assessment & Plan:   SUMMARY OF RECOMMENDATIONS   Full Code-as confirmed by Jenna [name], encouraged consideration of DNR/DNI in the setting of multiple co-morbidities.  Continue with current plan of care. Jenna [name] is clear in expressed goals to continue to treat the treatable. Jenna [name] is remaining hopeful for some stability/improvement. Jenna [name] is not interested in  restarting dialysis despite discussions of potential need. Goal is to discharge to SNF rehab with a goal of eventually returning home.   PMT will continue to support and follow as needed. Please call team line with urgent needs.  Symptom Management:  Per Attending  Palliative Prophylaxis:  Bowel Regimen and Frequent Pain Assessment  Additional Recommendations (Limitations, Scope, Preferences): Full Scope Treatment  Psycho-social/Spiritual:  Desire for further Chaplaincy support: yes Additional Recommendations:  Ongoing goals of care discussions  Prognosis:  Guarded  Discharge Planning:  To Be Determined    Jenna [name] expressed understanding and was in agreement with this plan.   Time In: 1045 Time Out: 1140 Time Total: 55 min.   Visit consisted of counseling and education dealing with the complex and emotionally intense issues of symptom management and palliative care in the setting of serious and potentially life-threatening illness.Greater than 50%  of this time was spent counseling and coordinating care related to the above assessment and plan.  Signed by:  Alda Lea, AGPCNP-BC Palliative Medicine Team  Phone: [phone] Pager: [phone] Amion: Bjorn Pippin   Thank you for allowing the Palliative Medicine Team to assist in the care of this Jenna [name]. Please utilize secure chat with additional questions, if there is no response within 30 minutes please call the above phone number. Palliative Medicine Team providers are available by phone from 7am to 5pm daily and can be reached through the team cell phone.  Should this Jenna [name] require assistance outside of these hours, please call the Jenna [name]'s attending physician.

## 2020-11-06 NOTE — Plan of Care (Signed)

## 2020-11-06 NOTE — Social Work (Signed)
SW consult for SNF; however, OT recommending HH and pt ambulated 125' min guard with PT. Pt does have CAP services in place, unsure how may hours per week or if hours can be increased based on current needs.   Palliative Care following for GOC, pt currently refusing to re-start HD. SW will assist as indicated.   Wandra Feinstein, MSW, LCSW [phone] (coverage)

## 2020-11-07 ENCOUNTER — Inpatient Hospital Stay (HOSPITAL_COMMUNITY): Payer: Medicare Other

## 2020-11-07 LAB — RENAL FUNCTION PANEL
Albumin: 2.7 g/dL — ABNORMAL LOW (ref 3.5–5.0)
Anion gap: 11 (ref 5–15)
BUN: 76 mg/dL — ABNORMAL HIGH (ref 8–23)
CO2: 18 mmol/L — ABNORMAL LOW (ref 22–32)
Calcium: 9 mg/dL (ref 8.9–10.3)
Chloride: 103 mmol/L (ref 98–111)
Creatinine, Ser: 4.24 mg/dL — ABNORMAL HIGH (ref 0.44–1.00)
GFR, Estimated: 11 mL/min — ABNORMAL LOW (ref >60)
Glucose, Bld: 88 mg/dL (ref 70–99)
Phosphorus: 4.9 mg/dL — ABNORMAL HIGH (ref 2.5–4.6)
Potassium: 4.2 mmol/L (ref 3.5–5.1)
Sodium: 132 mmol/L — ABNORMAL LOW (ref 135–145)

## 2020-11-07 LAB — CBC
HCT: 32.7 % — ABNORMAL LOW (ref 36.0–46.0)
Hemoglobin: 10.5 g/dL — ABNORMAL LOW (ref 12.0–15.0)
MCH: 29.5 pg (ref 26.0–34.0)
MCHC: 32.1 g/dL (ref 30.0–36.0)
MCV: 91.9 fL (ref 80.0–100.0)
Platelets: 77 10*3/uL — ABNORMAL LOW (ref 150–400)
RBC: 3.56 MIL/uL — ABNORMAL LOW (ref 3.87–5.11)
RDW: 22.2 % — ABNORMAL HIGH (ref 11.5–15.5)
WBC: 2.8 10*3/uL — ABNORMAL LOW (ref 4.0–10.5)
nRBC: 0 % (ref 0.0–0.2)

## 2020-11-07 LAB — MAGNESIUM: Magnesium: 2.6 mg/dL — ABNORMAL HIGH (ref 1.7–2.4)

## 2020-11-07 NOTE — Progress Notes (Signed)
[name] ROUNDING NOTE   Subjective:   Interval History: This is a 65 year old lady with history end-stage renal disease status post renal transplant April 2020.  She has a history of hypertension diastolic dysfunction history of CVA presented 917 with passage of dark stools.  She has acute kidney injury with a creatinine that increased to 4.73 creatinine on 10/15/2020 was 3.3.  She has no evidence of obstruction.  Her baseline creatinine appears to be in the twos.  She is followed by Dr. Johnney Ou at [facility].  She has a history of left upper arm AV fistula placement.  She continues on Prograf and prednisone.  We will restart torsemide 20 mg twice daily.  We will check a chest x-ray  Blood pressure 146/83 pulse 72 temperature 98.6 O2 sats 96% room air  Urine output 150 cc 11/06/2020    chest x-ray shows moderate to large right pleural effusion associated with atelectasis increased from prior examination cardiomegaly with diffuse bilateral interstitial edema likely pulmonary edema.  Sodium 132 potassium 4.2 chloride 103 CO2 to 18 BUN 76 creatinine 4.24 glucose 88 calcium 9 phosphorus 4.9 magnesium 2.6 hemoglobin 10.5  Ultrasound shows a hypoechoic mass 1.3 cm nonspecific consistent with a hemangioma.  Thickening of the gallbladder wall 1.1 cm.  CT scan shows no evidence of obstruction.  Doppler ultrasound has been ordered of her transplanted kidney.   Elevated tacrolimus level 19.  11/02/2020 and repeat do not think this is a correct reflection of a trough tacrolimus   Objective:  Vital signs in last 24 hours:  Temp:  [97.4 F (36.3 C)-98.6 F (37 C)] 98.6 F (37 C) (09/23 0444) Pulse Rate:  [63-72] 63 (09/23 0444) Resp:  [16-22] 20 (09/23 0444) BP: (115-146)/(66-102) 146/83 (09/23 0444) SpO2:  [96 %-99 %] 96 % (09/23 0444)  Weight change:  Filed Weights   11/04/20 0300 11/05/20 0500 11/06/20 0138  Weight: 94 kg 93.6 kg 93.5 kg    Intake/Output: I/O  last 3 completed shifts: In: 1647.7 [P.O.:1432; I.V.:7.9; IV Piggyback:207.8] Out: 150 [Urine:150]   Intake/Output this shift:  No intake/output data recorded.  Gen alert, no distress, obese No rash, cyanosis or gangrene Sclera anicteric, throat clear, slightly dry mouth No jvd or bruits Chest clear bilat to bases, no rales/ wheezing RRR no MRG Abd soft ntnd no mass or ascites +bs GU defer MS no joint effusions or deformity Ext 1-2+ ankle/ pretib edema bilat, no wounds or ulcers Neuro is alert, Ox 3 , nf, no asterixis LUA AVF + bruit   Basic Metabolic Panel: Recent Labs  Lab 11/02/20 0632 11/03/20 0703 11/04/20 0352 11/05/20 0507 11/06/20 0439 11/07/20 0233  NA 132* 132* 132* 131* 132* 132*  K 5.0 4.6 4.8 4.8 4.6 4.2  CL 99 102 101 100 102 103  CO2 20* 18* 17* 18* 18* 18*  GLUCOSE 72 77 96 92 90 88  BUN 84* 82* 83* 82* 82* 76*  CREATININE 4.65* 4.72* 4.72* 4.93* 4.55* 4.24*  CALCIUM 9.3 9.1 9.3 9.4 9.4 9.0  MG 2.6*  --   --   --  2.5* 2.6*  PHOS 5.5*  --  5.7*  --  5.5* 4.9*     Liver Function Tests: Recent Labs  Lab 11/01/20 1200 11/03/20 0703 11/04/20 0352 11/05/20 0507 11/06/20 0439 11/07/20 0233  AST 31 22  --  23 29  --   ALT 33 27  --  23 23  --   ALKPHOS 115 106  --  104 105  --   BILITOT 1.1 0.9  --  0.7 0.9  --   PROT 6.7 5.8*  --  5.9* 6.1*  --   ALBUMIN 3.2* 2.7* 2.7* 2.7* 2.8*  2.9* 2.7*    Recent Labs  Lab 11/01/20 1200  LIPASE 26    No results for input(s): AMMONIA in the last 168 hours.  CBC: Recent Labs  Lab 11/02/20 0632 11/03/20 0703 11/05/20 0507 11/06/20 0439 11/07/20 0233  WBC 3.2* 3.1* 3.6* 3.4* 2.8*  HGB 10.7* 9.9* 10.5* 10.6* 10.5*  HCT 33.2* 31.0* 32.5* 33.8* 32.7*  MCV 92.7 91.7 91.3 92.6 91.9  PLT 99* 91* PLATELET CLUMPS NOTED ON SMEAR, UNABLE TO ESTIMATE 86* 77*     Cardiac Enzymes: No results for input(s): CKTOTAL, CKMB, CKMBINDEX, TROPONINI in the last 168 hours.  BNP: Invalid input(s):  POCBNP  CBG: No results for input(s): GLUCAP in the last 168 hours.  Microbiology: Results for orders placed or performed during the hospital encounter of 11/01/20  Urine Culture     Status: Abnormal   Collection Time: 11/01/20  4:18 PM   Specimen: In/Out Cath Urine  Result Value Ref Range Status   Specimen Description IN/OUT CATH URINE  Final   Special Requests   Final    Immunocompromised Performed at [facility], [place] [address] [place], [place] [address]    Culture >=100,000 COLONIES/mL PSEUDOMONAS AERUGINOSA (A)  Final   Report Status 11/04/2020 FINAL  Final   Organism ID, Bacteria PSEUDOMONAS AERUGINOSA (A)  Final      Susceptibility   Pseudomonas aeruginosa - MIC*    CEFTAZIDIME 16 INTERMEDIATE Intermediate     CIPROFLOXACIN <=0.25 SENSITIVE Sensitive     GENTAMICIN 2 SENSITIVE Sensitive     IMIPENEM 1 SENSITIVE Sensitive     CEFEPIME RESISTANT Resistant     * >=100,000 COLONIES/mL PSEUDOMONAS AERUGINOSA  SARS CORONAVIRUS 2 (TAT 6-24 HRS) Nasopharyngeal Nasopharyngeal Swab     Status: None   Collection Time: 11/01/20 11:14 PM   Specimen: Nasopharyngeal Swab  Result Value Ref Range Status   SARS Coronavirus 2 NEGATIVE NEGATIVE Final    Comment: (NOTE) SARS-CoV-2 target nucleic acids are NOT DETECTED.  The SARS-CoV-2 RNA is generally detectable in upper and lower respiratory specimens during the acute phase of infection. Negative results do not preclude SARS-CoV-2 infection, do not rule out co-infections with other pathogens, and should not be used as the sole basis for treatment or other patient management decisions. Negative results must be combined with clinical observations, patient history, and epidemiological information. The expected result is Negative.  Fact Sheet for Patients: SugarRoll.be  Fact Sheet for Healthcare Providers: https://www.woods-mathews.com/  This test is not yet approved or cleared by the  [place] FDA and  has been authorized for detection and/or diagnosis of SARS-CoV-2 by FDA under an Emergency Use Authorization (EUA). This EUA will remain  in effect (meaning this test can be used) for the duration of the COVID-19 declaration under Se ction 564(b)(1) of the Act, 21 U.S.C. section 360bbb-3(b)(1), unless the authorization is terminated or revoked sooner.  Performed at [facility], [place] [address] [place], [place] [address]     Coagulation Studies: No results for input(s): LABPROT, INR in the last 72 hours.  Urinalysis: No results for input(s): COLORURINE, LABSPEC, PHURINE, GLUCOSEU, HGBUR, BILIRUBINUR, KETONESUR, PROTEINUR, UROBILINOGEN, NITRITE, LEUKOCYTESUR in the last 72 hours.  Invalid input(s): APPERANCEUR     Imaging: DG Chest 2 View  Result Date:  11/06/2020 CLINICAL DATA:  Shortness of breath EXAM: CHEST - 2 VIEW COMPARISON:  11/01/2020 FINDINGS: Moderate to large right pleural effusion and associated atelectasis or consolidation, increased compared to prior examination. Mild diffuse interstitial pulmonary opacity. Unchanged gross cardiomegaly. IMPRESSION: 1. Moderate to large right pleural effusion and associated atelectasis or consolidation, increased compared to prior examination. 2. Cardiomegaly and diffuse bilateral interstitial pulmonary opacity, likely edema. Electronically Signed   By: Eddie Candle M.D.   On: 11/06/2020 13:04     Medications:    sodium chloride Stopped (11/06/20 0007)   meropenem (MERREM) IV 500 mg (11/06/20 2244)    citalopram  10 mg Oral Daily   gabapentin  100 mg Oral QHS   hydroxychloroquine  200 mg Oral Daily   influenza vaccine adjuvanted  0.5 mL Intramuscular Tomorrow-1000   melatonin  5 mg Oral QHS   pantoprazole (PROTONIX) IV  40 mg Intravenous Q12H   predniSONE  5 mg Oral Q lunch   rosuvastatin  5 mg Oral q1800   sodium chloride flush  3 mL Intravenous Q12H   tacrolimus  0.5 mg Oral Daily   tacrolimus  1  mg Oral QHS   torsemide  20 mg Oral BID   valGANciclovir  450 mg Oral Once per day on Mon Thu   sodium chloride, acetaminophen **OR** acetaminophen, LORazepam, ondansetron **OR** ondansetron (ZOFRAN) IV  Assessment/ Plan:  AKI on CKD IV - b/l creat is 3.0- 3.4 from July 2022, egfr 14- 54m/min.  Pt admitted w/ abd pain, dark stools and creat here is 4.7 on admit 9/17. Appears to have UTI. Imaging is w/o obstruction, UA c/w infection.   We will repeat trough tacrolimus level.  Looks like she is losing her allograft.     She does have some anasarca with trace abdominal ascites.   She has a large pleural effusion.  Pulmonary edema on x-ray.  We will continue diuresis. UTI/pyelo - likely of transplant kidney given UA and CT findings. On meropenem.    GIB - FOBT +, Hb 10.3, GI consulting HFrEF - EF is 20-25% from last echo. 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

## 2020-11-07 NOTE — Progress Notes (Signed)
Occupational Therapy Treatment Patient Details Name: Jenna [name] MRN: [id number] DOB: December 07, 1955 Today's Date: 11/07/2020   History of present illness 65 y/o female presented to ED on 9/17 for constipation, dark stool, and abdominal discomfort (RLQ). CT abdomen pelvis shows mild increased attenuation in fat adjacent to R renal pelvis and gallbladder wall is thickened, fascinating adjacent to the gallbladder. Concern for possible pyelonephritis of transplanted kidney. 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   OT comments  Pt resting in supine upon arrival, assisted to raise trunk to EOB. Min guard with RW to use BSC and walk to sink to wash hands. Pt with low activity tolerance with increased time and effort for all activities. Updated d/c plan to SNF for further rehab as pt lives alone with only 4 hours a day of personal care assistance.    Recommendations for follow up therapy are one component of a multi-disciplinary discharge planning process, led by the attending physician.  Recommendations may be updated based on patient status, additional functional criteria and insurance authorization.    Follow Up Recommendations  SNF;Supervision/Assistance - 24 hour    Equipment Recommendations  Tub/shower bench    Recommendations for Other Services      Precautions / Restrictions Precautions Precautions: Fall Precaution Comments: denies recent falls       Mobility Bed Mobility Overal bed mobility: Needs Assistance Bed Mobility: Supine to Sit;Sit to Supine     Supine to sit: Min assist;HOB elevated Sit to supine: Supervision   General bed mobility comments: pt reaching toward therapist for assist to raise trunk    Transfers Overall transfer level: Needs assistance Equipment used: Rolling  walker (2 wheeled) Transfers: Sit to/from [facility] Sit to Stand: Min guard Stand pivot transfers: Min guard       General transfer comment: cues for hand placement with RW, effortful    Balance Overall balance assessment: Needs assistance   Sitting balance-Leahy Scale: Fair     Standing balance support: Bilateral upper extremity supported;During functional activity Standing balance-Leahy Scale: Poor Standing balance comment: reliant on UE support of RW                           ADL either performed or assessed with clinical judgement   ADL Overall ADL's : Needs assistance/impaired     Grooming: Wash/dry hands;Standing;Min guard                   Toilet Transfer: Min guard;Stand-pivot;BSC   Toileting- Water quality scientist and Hygiene: Min guard;Sit to/from stand       Functional mobility during ADLs: Min guard;Rolling walker General ADL Comments: per NT, pt made her own bed up today as no one had come to do it for her, was fatiguing to pt     Vision       Perception     Praxis      Cognition Arousal/Alertness: Awake/alert Behavior During Therapy: Flat affect;Anxious Overall Cognitive Status: No family/caregiver present to determine baseline cognitive functioning Area of Impairment: Memory                     Memory: Decreased short-term memory         General Comments: pt did not recall being informed of thoracentesis yesterday and refused procedure  today        Exercises     Shoulder Instructions       General Comments      Pertinent Vitals/ Pain       Pain Assessment: Faces Faces Pain Scale: Hurts a little bit Pain Location: generalized Pain Descriptors / Indicators: Discomfort Pain Intervention(s): Monitored during session  Home Living                                          Prior Functioning/Environment              Frequency  Min 2X/week        Progress  Toward Goals  OT Goals(current goals can now be found in the care plan section)  Progress towards OT goals: Not progressing toward goals - comment (low endurance)  Acute Rehab OT Goals Patient Stated Goal: to get a ground floor apartment OT Goal Formulation: With patient Time For Goal Achievement: 11/20/20 Potential to Achieve Goals: Good  Plan Discharge plan needs to be updated    Co-evaluation                 AM-PAC OT "6 Clicks" Daily Activity     Outcome Measure   Help from another person eating meals?: None Help from another person taking care of personal grooming?: A Little Help from another person toileting, which includes using toliet, bedpan, or urinal?: A Little Help from another person bathing (including washing, rinsing, drying)?: A Little Help from another person to put on and taking off regular upper body clothing?: A Little Help from another person to put on and taking off regular lower body clothing?: A Little 6 Click Score: 19    End of Session Equipment Utilized During Treatment: Rolling walker;Gait belt  OT Visit Diagnosis: Unsteadiness on feet (R26.81);Other abnormalities of gait and mobility (R26.89);Muscle weakness (generalized) (M62.81)   Activity Tolerance Patient limited by fatigue   Patient Left with call bell/phone within reach;in bed   Nurse Communication          Time: 9518-8416 OT Time Calculation (min): 16 min  Charges: OT General Charges $OT Visit: 1 Visit OT Treatments $Self Care/Home Management : 8-22 mins  Nestor Lewandowsky, OTR/L Acute Rehabilitation Services Pager: [phone] Office: [phone]   Jenna [name] 11/07/2020, 4:14 PM

## 2020-11-07 NOTE — Progress Notes (Signed)
Patient refused to go to IR. MD on the unit and has been notified. MD made aware of tele order expired per CCMD. MD reordered tele order for patient.

## 2020-11-07 NOTE — NC FL2 (Addendum)
[place] LEVEL OF CARE SCREENING TOOL     IDENTIFICATION  Patient Name: Jenna [name] Birthdate: 1956/01/26 Sex: female Admission Date (Current Location): 11/01/2020  [facility] and [place] Number:  Herbalist and Address:  [facility] [place], [place] [address] [place], [place] [address]      Provider Number: [id number]  Attending Physician Name and Address:  Bonnell Public, MD  Relative Name and Phone Number:  Lai, Hendriks (Daughter)   [phone]    Current Level of Care: Hospital Recommended Level of Care: [place] Prior Approval Number:    Date Approved/Denied:   PASRR Number:  [id number]  Discharge Plan: SNF    Current Diagnoses: Patient Active Problem List   Diagnosis Date Noted   AKI (acute kidney injury) ([place]) 11/02/2020   Right upper quadrant pain    Acute renal failure superimposed on stage 4 chronic kidney disease ([place]) 11/01/2020   Pyelonephritis of transplanted kidney 11/01/2020   Thrombocytopenia ([place]) 11/01/2020   Liver mass 11/01/2020   Heme positive stool 11/01/2020   Insomnia 10/29/2020   Peripheral neuropathy 10/13/2020   Acute on chronic systolic CHF (congestive heart failure) ([place]) 07/11/2020   Acute respiratory failure with hypoxia ([place]) 07/11/2020   Cytomegalovirus (CMV) viremia ([place]) 07/11/2020   Elevated troponin 07/11/2020   Anemia of chronic disease 07/11/2020   Acute kidney injury superimposed on chronic kidney disease ([place]) 07/11/2020   Allergic rhinitis 01/01/2020   Anxiety disorder 06/26/2019   HFrEF (heart failure with reduced ejection fraction) ([place]) Mar 09, 2019   CAD (coronary artery disease) 2019-03-09   Paroxysmal atrial fibrillation ([place]) 03-09-2019   Deceased-donor kidney transplant 03/27/2018   Dyslipidemia 11/06/2017   CKD (chronic kidney disease) stage 5, GFR less than 15 ml/min ([name]) 02/15/2017   Back pain at L4-L5 level 10/31/2014   Muscle spasm of back  10/31/2014   Chronic maxillary sinusitis 07/04/2014   Occipital headache 07/04/2014   Increased urinary frequency 05/23/2014   Falls 05/23/2014   Rash and nonspecific skin eruption 05/23/2014   Hypertension with renal disease 04/16/2014   Encounter for immunization 11/19/2013   Lupus (systemic lupus erythematosus) ([place]) 09/24/2013   TIA (transient ischemic attack) 05/05/2013   Numbness and tingling of left side of face 05/04/2013   Lupus ([place]) 04/15/2011   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    Orientation RESPIRATION BLADDER Height & Weight     Self, Time, Situation, Place  Normal Continent Weight:  (patient refused) Height:  $Remove'5\' 7"'LtaMGTe$  (170.2 cm)  BEHAVIORAL SYMPTOMS/MOOD NEUROLOGICAL BOWEL NUTRITION STATUS      Continent Diet (See DC Summary)  AMBULATORY STATUS COMMUNICATION OF NEEDS Skin   Limited Assist Verbally Normal                       Personal Care Assistance Level of Assistance  Bathing, Feeding, Dressing Bathing Assistance: Limited assistance Feeding assistance: Independent Dressing Assistance: Limited assistance     Functional Limitations Info  Sight, Hearing, Speech Sight Info: Impaired Hearing Info: Adequate Speech Info: Adequate    SPECIAL CARE FACTORS FREQUENCY  PT (By licensed PT), OT (By licensed OT)     PT Frequency: 5x a week OT Frequency: 5x a week            Contractures Contractures Info: Not present    Additional Factors Info  Code Status, Allergies Code Status Info: Full Allergies Info: Enalapril Maleate   Penicillins  Chocolate   Tape           Current Medications (11/07/2020):  This is the current hospital active medication list Current Facility-Administered Medications  Medication Dose Route Frequency Provider Last Rate Last Admin   0.9 %  sodium chloride infusion   Intravenous PRN Mercy Riding, MD   Stopped at 11/06/20 0007   acetaminophen (TYLENOL) tablet 650 mg  650  mg Oral Q6H PRN Lenore Cordia, MD   650 mg at 11/07/20 1032   Or   acetaminophen (TYLENOL) suppository 650 mg  650 mg Rectal Q6H PRN Lenore Cordia, MD       citalopram (CELEXA) tablet 10 mg  10 mg Oral Daily Zada Finders R, MD   10 mg at 11/07/20 1029   gabapentin (NEURONTIN) capsule 100 mg  100 mg Oral QHS Zada Finders R, MD   100 mg at 11/06/20 2244   hydroxychloroquine (PLAQUENIL) tablet 200 mg  200 mg Oral Daily Zada Finders R, MD   200 mg at 11/07/20 1030   influenza vaccine adjuvanted (FLUAD) injection 0.5 mL  0.5 mL Intramuscular Tomorrow-1000 [place], Marge Duncans, MD       LORazepam (ATIVAN) tablet 0.5 mg  0.5 mg Oral Q12H PRN Wendee Beavers T, MD   0.5 mg at 11/06/20 1530   melatonin tablet 5 mg  5 mg Oral QHS Gonfa, Taye T, MD   5 mg at 11/06/20 2244   meropenem (MERREM) 500 mg in sodium chloride 0.9 % 100 mL IVPB  500 mg Intravenous Q12H Oswald Hillock, MD 200 mL/hr at 11/07/20 1041 500 mg at 11/07/20 1041   ondansetron (ZOFRAN) tablet 4 mg  4 mg Oral Q6H PRN Lenore Cordia, MD       Or   ondansetron (ZOFRAN) injection 4 mg  4 mg Intravenous Q6H PRN Zada Finders R, MD       pantoprazole (PROTONIX) injection 40 mg  40 mg Intravenous Q12H Oswald Hillock, MD   40 mg at 11/07/20 1042   predniSONE (DELTASONE) tablet 5 mg  5 mg Oral Q lunch Zada Finders R, MD   5 mg at 11/07/20 1200   rosuvastatin (CRESTOR) tablet 5 mg  5 mg Oral q1800 Zada Finders R, MD   5 mg at 11/06/20 1859   sodium chloride flush (NS) 0.9 % injection 3 mL  3 mL Intravenous Q12H Zada Finders R, MD   3 mL at 11/07/20 1042   tacrolimus (PROGRAF) capsule 0.5 mg  0.5 mg Oral Daily Zada Finders R, MD   0.5 mg at 11/07/20 1027   tacrolimus (PROGRAF) capsule 1 mg  1 mg Oral QHS Zada Finders R, MD   1 mg at 11/06/20 2244   torsemide (DEMADEX) tablet 20 mg  20 mg Oral BID Edrick Oh, MD   20 mg at 11/07/20 1029   valGANciclovir (VALCYTE) 450 MG tablet TABS 450 mg  450 mg Oral Once per day on Mon Thu Webb, Martin, MD   450 mg  at 11/06/20 1042     Discharge Medications: Please see discharge summary for a list of discharge medications.  Relevant Imaging Results:  Relevant Lab Results:   Additional Information SSN: [id number]; PFIZER Comrnaty([place] TOP) Covid-19 Vaccine 02/06/2020 , 05/30/2019 , 05/12/2019  Reece Agar, LCSWA

## 2020-11-07 NOTE — Progress Notes (Signed)
Mobility Specialist Progress Note:   11/07/20 0940  Mobility  Activity Ambulated in room;Transferred to/from [facility]  Range of Motion/Exercises Active;All extremities  Level of Assistance Minimal assist, patient does 75% or more  Assistive Device Four wheel walker  Minutes Ambulated 3 minutes  Distance Ambulated (ft) 40 ft  Mobility Ambulated with assistance in room  Mobility Response Tolerated poorly;RN notified  Mobility performed by Mobility specialist  Bed Position Chair  Transport method Ambulatory  $Mobility charge 1 Mobility   Pre Mobility: HR 79 bpm During Mobility: HR 85 bpm  Pt was received in chair and required max encouragement to agree to mobility. Agreed to ambulating to door and then sat on BSC for ~10 min and had a BM. Pt gets very anxious with multiple people in room. Requires reassurance, assistance, and is very self-limiting. Pt left in chair with call bell in reach and RN present.  Nelta Numbers Mobility Specialist  Phone [phone]

## 2020-11-07 NOTE — TOC Initial Note (Signed)
Transition of Care [facility]) - Initial/Assessment Note    Patient Details  Name: Jenna [name] MRN: [id number] Date of Birth: 1955-12-25  Transition of Care [facility]) CM/SW Contact:    Tresa Endo Phone Number: 11/07/2020, 4:18 PM  Clinical Narrative:                 CSW received SNF consult. CSW met with pt at bedside, pt asked CSW to contact her daughter who lives in [place]. CSW introduced self and explained role at the hospital. Pt reports that PTA the pt was living home alone. PT reports that prior to admission pt click score is 16 and pt needs min assistance. PT reports pt walked 31f.  CSW reviewed PT/OT recommendations for SNF. Pt reports being agreeable to a SNF for rehab. Pt daughter gave CSW permission to fax out to facilities in the area. Pt or daughter has no preference of facility at this time. CSW gave pt medicare.gov rating list to review. PT reports they are covid negative and has been vaccinated.  [facility][phone] about a program (Research scientist (physical sciences) for psychiatric an medical problems. CSW will follow up after pt DC plan is confirmed.  CSW will continue to follow.    Expected Discharge Plan: [place]riers to Discharge: Continued Medical Work up   Patient Goals and CMS Choice Patient states their goals for this hospitalization and ongoing recovery are:: return home   Choice offered to / list presented to : NA  Expected Discharge Plan and Services Expected Discharge Plan: [place]  Discharge Planning Services: CM Consult   Living arrangements for the past 2 months: Single Family Home                   DME Agency: NA       HH Arranged: NA          Prior Living Arrangements/Services Living arrangements for the past 2 months: Single Family Home Lives with:: Self Patient language and need for interpreter reviewed:: Yes Do you feel safe going back to the place where you  live?: Yes      Need for Family Participation in Patient Care: Yes (Comment) Care giver support system in place?: No (comment) Current home services: Homehealth aide (Health and safety inspector Criminal Activity/Legal Involvement Pertinent to Current Situation/Hospitalization: No - Comment as needed  Activities of Daily Living Home Assistive Devices/Equipment: Eyeglasses, Cane (specify quad or straight) ADL Screening (condition at time of admission) Patient's cognitive ability adequate to safely complete daily activities?: Yes Is the patient deaf or have difficulty hearing?: No Does the patient have difficulty seeing, even when wearing glasses/contacts?: No Does the patient have difficulty concentrating, remembering, or making decisions?: No Patient able to express need for assistance with ADLs?: Yes Does the patient have difficulty dressing or bathing?: No Independently performs ADLs?: Yes (appropriate for developmental age) Does the patient have difficulty walking or climbing stairs?: Yes Weakness of Legs: Both Weakness of Arms/Hands: None  Permission Sought/Granted                  Emotional Assessment Appearance:: Appears stated age Attitude/Demeanor/Rapport: Engaged Affect (typically observed): Appropriate Orientation: : Oriented to Place, Oriented to Self, Oriented to  Time, Oriented to Situation   Psych Involvement: No (comment)  Admission diagnosis:  Pyelonephritis [N12] Right upper quadrant pain [R10.11] Heme positive stool [R19.5] AKI (acute kidney injury) ([name] [N17.9] Acute renal failure superimposed  on stage 4 chronic kidney disease ([name]) [N17.9, N18.4] Patient Active Problem List   Diagnosis Date Noted   AKI (acute kidney injury) ([name]) 11/02/2020   Right upper quadrant pain    Acute renal failure superimposed on stage 4 chronic kidney disease ([name]) 11/01/2020   Pyelonephritis of transplanted kidney 11/01/2020   Thrombocytopenia ([name]) 11/01/2020   Liver mass 11/01/2020    Heme positive stool 11/01/2020   Insomnia 10/29/2020   Peripheral neuropathy 10/13/2020   Acute on chronic systolic CHF (congestive heart failure) ([place]) 07/11/2020   Acute respiratory failure with hypoxia ([name]) 07/11/2020   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   Elevated troponin 07/11/2020   Anemia of chronic disease 07/11/2020   Acute kidney injury superimposed on chronic kidney disease ([name]) 07/11/2020   Allergic rhinitis 01/01/2020   Anxiety disorder 06/26/2019   HFrEF (heart failure with reduced ejection fraction) ([place]) 2019-03-22   CAD (coronary artery disease) March 22, 2019   Paroxysmal atrial fibrillation ([place]) 03/22/19   Deceased-donor kidney transplant 03/27/2018   Dyslipidemia 11/06/2017   CKD (chronic kidney disease) stage 5, GFR less than 15 ml/min ([name]) 02/15/2017   Back pain at L4-L5 level 10/31/2014   Muscle spasm of back 10/31/2014   Chronic maxillary sinusitis 07/04/2014   Occipital headache 07/04/2014   Increased urinary frequency 05/23/2014   Falls 05/23/2014   Rash and nonspecific skin eruption 05/23/2014   Hypertension with renal disease 04/16/2014   Encounter for immunization 11/19/2013   Lupus (systemic lupus erythematosus) ([name]) 09/24/2013   TIA (transient ischemic attack) 05/05/2013   Numbness and tingling of left side of face 05/04/2013   Lupus ([name]) 04/15/2011   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   PCP:  [name], Betty G, MD Pharmacy:   [place], [address] [place] [address] Phone: [phone] Fax: [phone]  [id number] [id number]: [phone]: [phone]    Social Determinants of Health (SDOH) Interventions    Readmission Risk Interventions Readmission Risk Prevention Plan  11/05/2020 08/26/2020  Transportation Screening Complete Complete  PCP or Specialist Appt within 3-5 Days Complete -  HRI or Home Care Consult Complete Complete  Social Work Consult for RP[place]hurstPlanning/Counseling Complete Complete  Palliative Care Screening Not Applicable Not Applicable  Medication Review (Press photographer Complete Complete  Some recent data might be hidden

## 2020-11-07 NOTE — Progress Notes (Signed)
This chaplain responded to PMT consult for prayer.    The chaplain checked in with the Pt. RN-before the visit.  The Pt. is sleeping at the time of the chaplain's visit.  The chaplain will return at the best time for the Pt. and chaplain.

## 2020-11-07 NOTE — Progress Notes (Signed)
Interventional Radiology Brief Note:  Order noted for IR Thoracentesis.  Attempted to bring patient down for procedure, however she has refused.  Order will remain in place.  IR will be available early next week if thoracentesis still warranted.   Brynda Greathouse, MS RD PA-C

## 2020-11-07 NOTE — Progress Notes (Signed)
PROGRESS NOTE  Jenna [name] RJL:[id number] DOB: 1955-09-30   PCP: [place], Betty G, MD  Patient is from: Home.  Lives alone.  Uses walker at baseline.  DOA: 11/01/2020 LOS: 5  Chief complaints:  Melena and abdominal discomfort  Brief Narrative / Interim history: 65 year old F with PMH of ESRD s/p DDKT in 2020 now with CKD-4, systolic CHF (EF 20 to 25%), CAD/STEMI s/p DES to LAD in 9/20, PAF no longer on AC, CMV, AICD, HLD, anxiety and depression presenting with melena, abdominal discomfort and decreased UOP.  CT on 9/17 with age-indeterminate mild associated pelviectasis, prominent right ureter without evidence of stone, and thickened gallbladder wall.  RUQ [name] on 9/17 with hyperechoic liver mass measuring about 1.3 cm, thickened gallbladder wall without evidence of stone, sludge or Murphy sign.  MRI liver on 9/19 did not confirm the liver mass but diffuse wall thickening of gallbladder and diffuse soft tissue anasarca with trace ascites.  [name] of transplanted kidney on 9/19 unremarkable.    Creatinine remains elevated. Nephrology following and concerned that she may be losing her allograft but no imminent need for HD.  Resume home torsemide.  She is also on IV meropenem for Pseudomonas UTI and pyelonephritis of transplanted kidney.   Subjective: -Patient seen. -Patient has been walking on thoracentesis. -Pleural effusion is chronic. -No worsening of shortness of breath. -No chest pain. -No fever chills.   -Renal function is slowly improving.     Objective: Vitals:   11/07/20 0444 11/07/20 0857 11/07/20 1024 11/07/20 1128  BP: (!) 146/83 (!) 150/78 (!) 132/118 (!) 154/87  Pulse: 63 72 70 71  Resp: 20 18  18   Temp: 98.6 F (37 C)   98.2 F (36.8 C)  TempSrc: Oral   Oral  SpO2: 96%   97%  Weight:      Height:        Intake/Output Summary (Last 24 hours) at 11/07/2020 1311 Last data filed at 11/07/2020 1023 Gross per 24 hour  Intake 479 ml  Output 200 ml  Net 279 ml     Filed Weights   11/04/20 0300 11/05/20 0500 11/06/20 0138  Weight: 94 kg 93.6 kg 93.5 kg    Examination:  GENERAL: Obese. No apparent distress.  Nontoxic. HEENT: Patient is pale. No jaundice.  NECK: Supple.  JVD is difficult to assess.  RESP: On RA.  No IWOB.  Diminished aeration bilaterally. CVS:  S1S2, increased intensity of S2, and Systolic murmur.  ABD: Obese, soft and non tender.  Organs are difficult to assess. EXT:  Bilateral lower extremity edema.  NEURO: Awake and alert. Oriented appropriately.  No apparent focal neuro deficit.     Procedures:  None  Microbiology summarized: COVID-19 and influenza PCR nonreactive. 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  Concern about losing her allograft but no imminent need for HD yet.11/08/20 Recent Labs    08/23/20 0149 08/26/20 0626 10/15/20 1218 11/01/20 1200 11/02/20 11/04/20 11/03/20 0703 11/04/20 0352 11/05/20 0507 11/06/20 0439 11/07/20 0233  BUN 56* 49* 56* 85* 84* 82* 83* 82* 82* 76*  CREATININE 3.18* 3.05* 3.33* 4.73* 4.65* 4.72* 4.72* 4.93* 4.55* 4.24*   -Nephrology managing-restarted torsemide today -Tac level elevated to 18 but nephrology repeating -Immunosuppressives per nephrology  Pseudomonas UTI/pyelonephritis of transplanted kidney-no convincing radiologic or clinical evidence but she has transplanted kidney.  -Continue IV meropenem  Positive Hemoccult/melena: H&H stable. Recent Labs    07/30/20 1330 08/21/20 1053  08/22/20 0542 10/15/20 1218 11/01/20 1200 11/02/20 2637 11/03/20 0703 11/05/20 0507 11/06/20 0439 11/07/20 0233  HGB 8.3 Repeated and verified X2.* 8.6* 9.3* 10.3* 10.6* 10.7* 9.9* 10.5* 10.6* 10.5*   -GI recommended PPI twice daily, and outpatient follow-up for possible EGD -Continue monitoring  Acute on chronic systolic CHF: TTE with LVEF of 20 to 25%.  Follow[facility].  Now with SOB, DOE,  anasarca and large right-sided pleural effusion.   -Torsemide resumed by nephrology -Consider resuming BiDil at lower dose if BP stable with torsemide. -Continue holding Toprol-XL in the setting of borderline bradycardia -Monitor fluid status  Large right-sided pleural effusion-likely due CHF and renal failure. -US-guided thoracocentesis-diagnostic and therapeutic.   CAD s/p STEMI, DES to LAD 9/20-no chest pain. -Continue rosuvastatin -Toprol XL on hold  Paroxysmal A. fib: Currently in sinus rhythm.  Rate controlled.  Not on AC due to GI bleed. -Continue telemetry monitoring   Pancytopenia-relatively stable. -Continue monitoring while on meropenem  Hypervolemic hyponatremia-likely due to CHF and renal failure. -Management as above   Liver mass?  Noted on [name] but cannot confirm on MRI although MRI was without contrast. -Repeat imaging in about 3 months   History of CMV viremia -Continue Valcyte   Gallbladder wall thickening-likely due to fluid overload from CHF and renal failure -Management as above.   Right ankle tenderness-seems to have resolved.  Generalized weakness/physical deconditioning-lives alone.  Uses walker at baseline. -PT/OT  Anxiety and depression-feels like having a panic attack.  Could be due to anasarca/pleural effusion/CHF -Manage CHF, effusion and anasarca as above. -Discussed about bag breathing -Low-dose Ativan as needed -Continue home low-dose gabapentin.  Goal of care counseling-patient with significant comorbidity as above.  Really concerning long-term prognosis.  Patient, patient's daughter and brother prefers to continue full code with full scope of care.  I doubt CPR or intubation are to her best interest given here comorbidity if we happen to be in the situation. -Consulted palliative care.  Class I obesity Body mass index is 32.28 kg/m.         DVT prophylaxis:  SCDs Start: 11/01/20 2317  Code Status: Full code Family Communication:  Updated patient's daughter and patient's brother over the phone. Level of care: Telemetry Medical Status is: Inpatient  Remains inpatient appropriate because:Ongoing diagnostic testing needed not appropriate for outpatient work up, Unsafe d/c plan, and Inpatient level of care appropriate due to severity of illness  Dispo: The patient is from: Home              Anticipated d/c is to:  SNF              Patient currently is not medically stable to d/c.   Difficult to place patient No       Consultants:  Nephrology Gastroenterology Palliative medicine   Sch Meds:  Scheduled Meds:  citalopram  10 mg Oral Daily   gabapentin  100 mg Oral QHS   hydroxychloroquine  200 mg Oral Daily   influenza vaccine adjuvanted  0.5 mL Intramuscular Tomorrow-1000   melatonin  5 mg Oral QHS   pantoprazole (PROTONIX) IV  40 mg Intravenous Q12H   predniSONE  5 mg Oral Q lunch   rosuvastatin  5 mg Oral q1800   sodium chloride flush  3 mL Intravenous Q12H   tacrolimus  0.5 mg Oral Daily   tacrolimus  1 mg Oral QHS   torsemide  20 mg Oral BID   valGANciclovir  450 mg Oral Once  per day on Mon Thu   Continuous Infusions:  sodium chloride Stopped (11/06/20 0007)   meropenem (MERREM) IV 500 mg (11/07/20 1041)   PRN Meds:.sodium chloride, acetaminophen **OR** acetaminophen, LORazepam, ondansetron **OR** ondansetron (ZOFRAN) IV  Antimicrobials: Anti-infectives (From admission, onward)    Start     Dose/Rate Route Frequency Ordered Stop   11/06/20 0900  valGANciclovir (VALCYTE) 450 MG tablet TABS 450 mg        450 mg Oral Once per day on Mon Thu 11/04/20 1046     11/04/20 1015  meropenem (MERREM) 500 mg in sodium chloride 0.9 % 100 mL IVPB        500 mg 200 mL/hr over 30 Minutes Intravenous Every 12 hours 11/04/20 0925     11/03/20 1400  ceFEPIme (MAXIPIME) 1 g in sodium chloride 0.9 % 100 mL IVPB  Status:  Discontinued        1 g 200 mL/hr over 30 Minutes Intravenous Every 24 hours 11/03/20 1258  11/04/20 0925   11/03/20 1000  valGANciclovir (VALCYTE) 450 MG tablet TABS 450 mg  Status:  Discontinued        450 mg Oral Every other day 11/01/20 2320 11/04/20 1046   11/02/20 2200  cefTRIAXone (ROCEPHIN) 1 g in sodium chloride 0.9 % 100 mL IVPB  Status:  Discontinued        1 g 200 mL/hr over 30 Minutes Intravenous Daily at bedtime 11/01/20 2318 11/03/20 1258   11/02/20 1030  hydroxychloroquine (PLAQUENIL) tablet 200 mg        200 mg Oral Daily 11/01/20 2320     11/01/20 2200  cefTRIAXone (ROCEPHIN) 1 g in sodium chloride 0.9 % 100 mL IVPB        1 g 200 mL/hr over 30 Minutes Intravenous  Once 11/01/20 2145 11/01/20 2230        I have personally reviewed the following labs and images: CBC: Recent Labs  Lab 11/02/20 0632 11/03/20 0703 11/05/20 0507 11/06/20 0439 11/07/20 0233  WBC 3.2* 3.1* 3.6* 3.4* 2.8*  HGB 10.7* 9.9* 10.5* 10.6* 10.5*  HCT 33.2* 31.0* 32.5* 33.8* 32.7*  MCV 92.7 91.7 91.3 92.6 91.9  PLT 99* 91* PLATELET CLUMPS NOTED ON SMEAR, UNABLE TO ESTIMATE 86* 77*    BMP &GFR Recent Labs  Lab 11/02/20 0632 11/03/20 0703 11/04/20 0352 11/05/20 0507 11/06/20 0439 11/07/20 0233  NA 132* 132* 132* 131* 132* 132*  K 5.0 4.6 4.8 4.8 4.6 4.2  CL 99 102 101 100 102 103  CO2 20* 18* 17* 18* 18* 18*  GLUCOSE 72 77 96 92 90 88  BUN 84* 82* 83* 82* 82* 76*  CREATININE 4.65* 4.72* 4.72* 4.93* 4.55* 4.24*  CALCIUM 9.3 9.1 9.3 9.4 9.4 9.0  MG 2.6*  --   --   --  2.5* 2.6*  PHOS 5.5*  --  5.7*  --  5.5* 4.9*    Estimated Creatinine Clearance: 15.5 mL/min (A) (by C-G formula based on SCr of 4.24 mg/dL (H)). Liver & Pancreas: Recent Labs  Lab 11/01/20 1200 11/03/20 0703 11/04/20 0352 11/05/20 0507 11/06/20 0439 11/07/20 0233  AST 31 22  --  23 29  --   ALT 33 27  --  23 23  --   ALKPHOS 115 106  --  104 105  --   BILITOT 1.1 0.9  --  0.7 0.9  --   PROT 6.7 5.8*  --  5.9* 6.1*  --   ALBUMIN 3.2* 2.7* 2.7*  2.7* 2.8*  2.9* 2.7*    Recent Labs  Lab  11/01/20 1200  LIPASE 26    No results for input(s): AMMONIA in the last 168 hours. Diabetic: No results for input(s): HGBA1C in the last 72 hours. No results for input(s): GLUCAP in the last 168 hours. Cardiac Enzymes: No results for input(s): CKTOTAL, CKMB, CKMBINDEX, TROPONINI in the last 168 hours. No results for input(s): PROBNP in the last 8760 hours. Coagulation Profile: No results for input(s): INR, PROTIME in the last 168 hours. Thyroid Function Tests: No results for input(s): TSH, T4TOTAL, FREET4, T3FREE, THYROIDAB in the last 72 hours. Lipid Profile: No results for input(s): CHOL, HDL, LDLCALC, TRIG, CHOLHDL, LDLDIRECT in the last 72 hours. Anemia Panel: No results for input(s): VITAMINB12, FOLATE, FERRITIN, TIBC, IRON, RETICCTPCT in the last 72 hours. Urine analysis:    Component Value Date/Time   COLORURINE YELLOW 11/01/2020 2112   APPEARANCEUR CLOUDY (A) 11/01/2020 2112   LABSPEC 1.025 11/01/2020 2112   PHURINE 6.0 11/01/2020 2112   GLUCOSEU NEGATIVE 11/01/2020 2112   HGBUR NEGATIVE 11/01/2020 2112   BILIRUBINUR NEGATIVE 11/01/2020 2112   BILIRUBINUR neg 12/19/2014 [place] 11/01/2020 2112   PROTEINUR 100 (A) 11/01/2020 2112   UROBILINOGEN 0.2 12/19/2014 1157   UROBILINOGEN 0.2 05/23/2014 1301   NITRITE NEGATIVE 11/01/2020 2112   LEUKOCYTESUR MODERATE (A) 11/01/2020 2112   Sepsis Labs: Invalid input(s): PROCALCITONIN, [place]  Microbiology: Recent Results (from the past 240 hour(s))  Urine Culture     Status: Abnormal   Collection Time: 11/01/20  4:18 PM   Specimen: In/Out Cath Urine  Result Value Ref Range Status   Specimen Description IN/OUT CATH URINE  Final   Special Requests   Final    Immunocompromised Performed at [facility], [address], [place], [place] [address]    Culture >=100,000 COLONIES/mL PSEUDOMONAS AERUGINOSA (A)  Final   Report Status 11/04/2020 FINAL  Final   Organism ID, Bacteria PSEUDOMONAS  AERUGINOSA (A)  Final      Susceptibility   Pseudomonas aeruginosa - MIC*    CEFTAZIDIME 16 INTERMEDIATE Intermediate     CIPROFLOXACIN <=0.25 SENSITIVE Sensitive     GENTAMICIN 2 SENSITIVE Sensitive     IMIPENEM 1 SENSITIVE Sensitive     CEFEPIME RESISTANT Resistant     * >=100,000 COLONIES/mL PSEUDOMONAS AERUGINOSA  SARS CORONAVIRUS 2 (TAT 6-24 HRS) Nasopharyngeal Nasopharyngeal Swab     Status: None   Collection Time: 11/01/20 11:14 PM   Specimen: Nasopharyngeal Swab  Result Value Ref Range Status   SARS Coronavirus 2 NEGATIVE NEGATIVE Final    Comment: (NOTE) SARS-CoV-2 target nucleic acids are NOT DETECTED.  The SARS-CoV-2 RNA is generally detectable in upper and lower respiratory specimens during the acute phase of infection. Negative results do not preclude SARS-CoV-2 infection, do not rule out co-infections with other pathogens, and should not be used as the sole basis for treatment or other patient management decisions. Negative results must be combined with clinical observations, patient history, and epidemiological information. The expected result is Negative.  Fact Sheet for Patients: SugarRoll.be  Fact Sheet for Healthcare Providers: https://www.woods-mathews.com/  This test is not yet approved or cleared by the [place] FDA and  has been authorized for detection and/or diagnosis of SARS-CoV-2 by FDA under an Emergency Use Authorization (EUA). This EUA will remain  in effect (meaning this test can be used) for the duration of the COVID-19 declaration under Se ction 564(b)(1) of the Act, 21  U.S.C. section 360bbb-3(b)(1), unless the authorization is terminated or revoked sooner.  Performed at [facility], [place] [address], [place], [place] [address]     Radiology Studies: No results found.    Dana Allan, MD [facility]  If 7PM-7AM, please contact night-coverage [email or web address] 11/07/2020, 1:11  PM

## 2020-11-08 DIAGNOSIS — N179 Acute kidney failure, unspecified: Secondary | ICD-10-CM | POA: Diagnosis not present

## 2020-11-08 DIAGNOSIS — R0602 Shortness of breath: Secondary | ICD-10-CM | POA: Diagnosis not present

## 2020-11-08 DIAGNOSIS — I2510 Atherosclerotic heart disease of native coronary artery without angina pectoris: Secondary | ICD-10-CM | POA: Diagnosis not present

## 2020-11-08 DIAGNOSIS — D696 Thrombocytopenia, unspecified: Secondary | ICD-10-CM

## 2020-11-08 DIAGNOSIS — T8619 Other complication of kidney transplant: Secondary | ICD-10-CM | POA: Diagnosis not present

## 2020-11-08 DIAGNOSIS — M3214 Glomerular disease in systemic lupus erythematosus: Secondary | ICD-10-CM

## 2020-11-08 DIAGNOSIS — F419 Anxiety disorder, unspecified: Secondary | ICD-10-CM | POA: Diagnosis not present

## 2020-11-08 DIAGNOSIS — N12 Tubulo-interstitial nephritis, not specified as acute or chronic: Secondary | ICD-10-CM | POA: Diagnosis not present

## 2020-11-08 DIAGNOSIS — I501 Left ventricular failure: Secondary | ICD-10-CM

## 2020-11-08 LAB — RENAL FUNCTION PANEL
Albumin: 2.6 g/dL — ABNORMAL LOW (ref 3.5–5.0)
Anion gap: 11 (ref 5–15)
BUN: 77 mg/dL — ABNORMAL HIGH (ref 8–23)
CO2: 17 mmol/L — ABNORMAL LOW (ref 22–32)
Calcium: 9 mg/dL (ref 8.9–10.3)
Chloride: 105 mmol/L (ref 98–111)
Creatinine, Ser: 3.79 mg/dL — ABNORMAL HIGH (ref 0.44–1.00)
GFR, Estimated: 13 mL/min — ABNORMAL LOW (ref >60)
Glucose, Bld: 90 mg/dL (ref 70–99)
Phosphorus: 4.8 mg/dL — ABNORMAL HIGH (ref 2.5–4.6)
Potassium: 4.3 mmol/L (ref 3.5–5.1)
Sodium: 133 mmol/L — ABNORMAL LOW (ref 135–145)

## 2020-11-08 NOTE — Progress Notes (Signed)
Palliative Medicine Inpatient Follow Up Note     Chart Reviewed. Patient assessed at the bedside.   Patient sitting up on bedside. Denies pain. Does endorse fatigue with exertion. No family present. States appetite is fair.   We reviewed previously dicussed goals of care. Patient goals remain clear for full code/full scope care. Confirming she and her daughter both are in agreement. She would however not want to remain on any forms of life prolonging measures long-term. If she would not have a meaningful recovery she then states "let me go be with the Lord".   I approached discussions with patient about her refusal for thoracentesis. Patient states she does not know what the procedure entails, what it is for, and also is not familiar with the doctor. She shares her mistrust in the health system stating "she is not a [place] pig". She states her brother told her it would hurt. I discussed at length the rational for thoracentesis, including pulling up film to explain to her exactly what this means. Education provided on the procedure also allowing her to watch a short video on the actual procedure to allow her to see what would be done and that the individual was not in any distress. She verbalized appreciation and shares she feels much better about it. I also explained that she would not be familiar with the provider performing the procedure as this is what he specializes in and he is not a regular member of her care team that she would see daily. Jenna [name] verbalized understanding and expresses she will now consider the procedure but is planning to call and speak with her daughter.   Discussed the importance of continued conversation with family and their  medical providers regarding overall plan of care and treatment options, ensuring decisions are within the context of the patients values and GOCs.   Questions addressed and support provided.    Objective Assessment: Vital Signs Vitals:    11/07/20 1932 11/08/20 0446  BP: (!) 151/89 (!) 141/77  Pulse: 73 78  Resp: 18 18  Temp: 97.9 F (36.6 C) 98.2 F (36.8 C)  SpO2: 98% 97%    Intake/Output Summary (Last 24 hours) at 11/08/2020 1123 Last data filed at 11/08/2020 0900 Gross per 24 hour  Intake 680 ml  Output 651 ml  Net 29 ml   Last Weight  Most recent update: 11/08/2020  2:31 AM    Weight  93.3 kg (205 lb 11.2 oz)            Gen:  NAD, obese AA female HEENT: moist mucous membranes CV: Regular rate and rhythm, no murmurs rubs or gallops PULM: clear to auscultation bilaterally. No wheezes/rales/rhonchi ABD: soft/nontender/nondistended/normal bowel sounds EXT: bilateral lower extremity edema  Neuro: Alert and oriented x3, mood appropriate   SUMMARY OF RECOMMENDATIONS   Continue with current plan of care per medical team  Patient is remaining hopeful for improvement. Extensive discussion regarding thoracentesis in addition to goals of care. Request full code/full scope with a goal of SNF rehab. She is not interested in outpatient Palliative at this time due to mistrust issues. Is hopeful for continued CAP program support.  PMT will continue to support and follow on as needed basis. Will not see daily as goals are set and clear. Please secure chat for urgent needs.   Time Total: 40 min.   Visit consisted of counseling and education dealing with the complex and emotionally intense issues of symptom management and palliative care in the  setting of serious and potentially life-threatening illness.Greater than 50%  of this time was spent counseling and coordinating care related to the above assessment and plan.  Alda Lea, AGPCNP-BC  Palliative Medicine Team [phone]  Palliative Medicine Team providers are available by phone from 7am to 7pm daily and can be reached through the team cell phone. Should this patient require assistance outside of these hours, please call the patient's attending  physician.

## 2020-11-08 NOTE — Progress Notes (Signed)
PROGRESS NOTE  Jenna [name]:[id number] DOB: January 26, 1956   PCP: [name], Betty G, MD  Patient is from: Home.  Lives alone.  Uses walker at baseline.  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  CT on 9/17 with age-indeterminate mild associated pelviectasis, prominent right ureter without evidence of stone, and thickened gallbladder wall.  RUQ [name] on 9/17 with hyperechoic liver mass measuring about 1.3 cm, thickened gallbladder wall without evidence of stone, sludge or Murphy sign.  MRI liver on 9/19 did not confirm the liver mass but diffuse wall thickening of gallbladder and diffuse soft tissue anasarca with trace ascites.  [name] of transplanted kidney on 9/19 unremarkable.    Creatinine remains elevated. Nephrology following and concerned that she may be losing her allograft but no imminent need for HD.  Resume home torsemide.  She is also on IV meropenem for Pseudomonas UTI and pyelonephritis of transplanted kidney.   11/08/2020: Renal function continues to improve.  No recent tacrolimus level.  Nephrology input is appreciated.  Patient will be discharged once cleared for discharge by the nephrology team.  Subjective: -Patient seen. -Patient has been walking on thoracentesis. -Pleural effusion is chronic. -No worsening of shortness of breath. -No chest pain. -No fever chills.   -Renal function is slowly improving.     Objective: Vitals:   11/08/20 0231 11/08/20 0446 11/08/20 1248 11/08/20 2011  BP:  (!) 141/77 (!) 143/90 133/75  Pulse:  78 69 72  Resp:  $Remo'18 16 18  'oLmhn$ Temp:  98.2 F (36.8 C) 98.3 F (36.8 C) (!) 97.5 F (36.4 C)  TempSrc:  Oral Oral Oral  SpO2:  97%   98%  Weight: 93.3 kg     Height:        Intake/Output Summary (Last 24 hours) at 11/08/2020 2021 Last data filed at 11/08/2020 1700 Gross per 24 hour  Intake 720 ml  Output 402 ml  Net 318 ml    Filed Weights   11/05/20 0500 11/06/20 0138 11/08/20 0231  Weight: 93.6 kg 93.5 kg 93.3 kg    Examination:  GENERAL: Obese. No apparent distress.  Nontoxic. HEENT: Patient is pale. No jaundice.  NECK: Supple.  JVD is difficult to assess.  RESP: On RA.  No IWOB.  Diminished aeration bilaterally. CVS:  S1S2, increased intensity of S2, and Systolic murmur.  ABD: Obese, soft and non tender.  Organs are difficult to assess. EXT:  Bilateral lower extremity edema.  NEURO: Awake and alert. Oriented appropriately.  No apparent focal neuro deficit.     Procedures:  None  Microbiology summarized: [id number] and influenza PCR nonreactive. 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  Concern about losing her allograft but no imminent need for HD yet.Marland Kitchen Recent Labs    08/26/20 0626 10/15/20 1218 11/01/20 1200 11/02/20 9379 11/03/20 0703 11/04/20 0352 11/05/20 0507 11/06/20 0439 11/07/20 0233 11/08/20 0559  BUN 49* 56* 85* 84* 82* 83* 82* 82* 76* 77*  CREATININE 3.05* 3.33* 4.73* 4.65* 4.72* 4.72* 4.93* 4.55* 4.24* 3.79*   -Nephrology managing-restarted torsemide today -Tac level elevated to 18  -No repeat tacrolimus level visualized. -Immunosuppressives  per nephrology  Pseudomonas UTI/pyelonephritis of transplanted kidney-no convincing radiologic or clinical evidence but she has transplanted kidney.  -Continue IV meropenem  Positive Hemoccult/melena: H&H stable. Recent Labs    07/30/20 1330 08/21/20 1053 08/22/20 0542 10/15/20 1218 11/01/20 1200 11/02/20 0981 11/03/20 0703 11/05/20 0507 11/06/20 0439 11/07/20 0233  HGB 8.3 Repeated and verified X2.* 8.6* 9.3* 10.3* 10.6* 10.7* 9.9* 10.5*  10.6* 10.5*   -GI recommended PPI twice daily, and outpatient follow-up for possible EGD -Continue monitoring  Acute on chronic systolic CHF: TTE with LVEF of 20 to 25%.  Follow[facility].  Now with SOB, DOE, anasarca and large right-sided pleural effusion.   -Torsemide resumed by nephrology -Consider resuming BiDil at lower dose if BP stable with torsemide. -Continue holding Toprol-XL in the setting of borderline bradycardia -Monitor fluid status  Large right-sided pleural effusion-likely due CHF and renal failure. -Patient is not keen on US-guided thoracocentesis-diagnostic and therapeutic. -Pleural effusion is chronic.   CAD s/p STEMI, DES to LAD 9/20-no chest pain. -Continue rosuvastatin -Toprol XL on hold  Paroxysmal A. fib: Currently in sinus rhythm.  Rate controlled.  Not on AC due to GI bleed. -Continue telemetry monitoring   Pancytopenia-relatively stable. -Continue monitoring while on meropenem  Hypervolemic hyponatremia-likely due to CHF and renal failure. -Management as above -Continue diuretics.   Liver mass?  Noted on [name] but cannot confirm on MRI although MRI was without contrast. -Repeat imaging in about 3 months   History of CMV viremia -Continue Valcyte   Gallbladder wall thickening-likely due to fluid overload from CHF and renal failure -Management as above.   Right ankle tenderness-seems to have resolved.  Generalized weakness/physical deconditioning-lives alone.  Uses walker at baseline. -PT/OT  Anxiety and depression-feels like having a panic attack.  Could be due to anasarca/pleural effusion/CHF -Manage CHF, effusion and anasarca as above. -Discussed about bag breathing -Low-dose Ativan as needed -Continue home low-dose gabapentin.  Goal of care counseling-patient with significant comorbidity as above.  Really concerning long-term prognosis.  Patient, patient's daughter and brother prefers to continue full code with full scope of care.  I  doubt CPR or intubation are to her best interest given here comorbidity if we happen to be in the situation. -Consulted palliative care.  Class I obesity Body mass index is 32.22 kg/m.         DVT prophylaxis:  SCDs Start: 11/01/20 2317  Code Status: Full code Family Communication: Updated patient's daughter and patient's brother over the phone. Level of care: Telemetry Medical Status is: Inpatient  Remains inpatient appropriate because:Ongoing diagnostic testing needed not appropriate for outpatient work up, Unsafe d/c plan, and Inpatient level of care appropriate due to severity of illness  Dispo: The patient is from: Home              Anticipated d/c is to:  SNF              Patient currently is not medically stable to d/c.   Difficult to place patient No       Consultants:  Nephrology Gastroenterology Palliative medicine   Sch Meds:  Scheduled Meds:  citalopram  10 mg Oral Daily   gabapentin  100 mg Oral QHS   hydroxychloroquine  200 mg Oral Daily   influenza vaccine adjuvanted  0.5 mL Intramuscular Tomorrow-1000   melatonin  5 mg Oral QHS   pantoprazole (PROTONIX) IV  40 mg Intravenous Q12H   predniSONE  5 mg Oral Q lunch   rosuvastatin  5 mg Oral q1800   sodium chloride flush  3 mL Intravenous Q12H   tacrolimus  0.5 mg Oral Daily   tacrolimus  1 mg Oral QHS   torsemide  20 mg Oral BID   valGANciclovir  450 mg Oral Once per day on Mon Thu   Continuous Infusions:  sodium chloride Stopped (11/06/20 0007)   meropenem (MERREM) IV 500 mg (11/08/20 1020)   PRN Meds:.sodium chloride, acetaminophen **OR** acetaminophen, LORazepam, ondansetron **OR** ondansetron (ZOFRAN) IV  Antimicrobials: Anti-infectives (From admission, onward)    Start     Dose/Rate Route Frequency Ordered Stop   11/06/20 0900  valGANciclovir (VALCYTE) 450 MG tablet TABS 450 mg        450 mg Oral Once per day on Mon Thu 11/04/20 1046     11/04/20 1015  meropenem (MERREM) 500 mg in  sodium chloride 0.9 % 100 mL IVPB        500 mg 200 mL/hr over 30 Minutes Intravenous Every 12 hours 11/04/20 0925     11/03/20 1400  ceFEPIme (MAXIPIME) 1 g in sodium chloride 0.9 % 100 mL IVPB  Status:  Discontinued        1 g 200 mL/hr over 30 Minutes Intravenous Every 24 hours 11/03/20 1258 11/04/20 0925   11/03/20 1000  valGANciclovir (VALCYTE) 450 MG tablet TABS 450 mg  Status:  Discontinued        450 mg Oral Every other day 11/01/20 2320 11/04/20 1046   11/02/20 2200  cefTRIAXone (ROCEPHIN) 1 g in sodium chloride 0.9 % 100 mL IVPB  Status:  Discontinued        1 g 200 mL/hr over 30 Minutes Intravenous Daily at bedtime 11/01/20 2318 11/03/20 1258   11/02/20 1030  hydroxychloroquine (PLAQUENIL) tablet 200 mg        200 mg Oral Daily 11/01/20 2320     11/01/20 2200  cefTRIAXone (ROCEPHIN) 1 g in sodium chloride 0.9 % 100 mL IVPB        1 g 200 mL/hr over 30 Minutes Intravenous  Once 11/01/20 2145 11/01/20 2230        I have personally reviewed the following labs and images: CBC: Recent Labs  Lab 11/02/20 0632 11/03/20 0703 11/05/20 0507 11/06/20 0439 11/07/20 0233  WBC 3.2* 3.1* 3.6* 3.4* 2.8*  HGB 10.7* 9.9* 10.5* 10.6* 10.5*  HCT 33.2* 31.0* 32.5* 33.8* 32.7*  MCV 92.7 91.7 91.3 92.6 91.9  PLT 99* 91* PLATELET CLUMPS NOTED ON SMEAR, UNABLE TO ESTIMATE 86* 77*    BMP &GFR Recent Labs  Lab 11/02/20 8003 11/03/20 0703 11/04/20 0352 11/05/20 0507 11/06/20 0439 11/07/20 0233 11/08/20 0559  NA 132*   < > 132* 131* 132* 132* 133*  K 5.0   < > 4.8 4.8 4.6 4.2 4.3  CL 99   < > 101 100 102 103 105  CO2 20*   < > 17* 18* 18* 18* 17*  GLUCOSE 72   < > 96 92 90 88 90  BUN 84*   < > 83* 82* 82* 76* 77*  CREATININE 4.65*   < > 4.72* 4.93* 4.55* 4.24* 3.79*  CALCIUM 9.3   < > 9.3 9.4 9.4 9.0 9.0  MG 2.6*  --   --   --  2.5* 2.6*  --   PHOS 5.5*  --  5.7*  --  5.5* 4.9* 4.8*   < > = values in this interval not displayed.    Estimated Creatinine Clearance: 17.4 mL/min  (  A) (by C-G formula based on SCr of 3.79 mg/dL (H)). Liver & Pancreas: Recent Labs  Lab 11/03/20 0703 11/04/20 0352 11/05/20 0507 11/06/20 0439 11/07/20 0233 11/08/20 0559  AST 22  --  23 29  --   --   ALT 27  --  23 23  --   --   ALKPHOS 106  --  104 105  --   --   BILITOT 0.9  --  0.7 0.9  --   --   PROT 5.8*  --  5.9* 6.1*  --   --   ALBUMIN 2.7* 2.7* 2.7* 2.8*  2.9* 2.7* 2.6*    No results for input(s): LIPASE, AMYLASE in the last 168 hours.  No results for input(s): AMMONIA in the last 168 hours. Diabetic: No results for input(s): HGBA1C in the last 72 hours. No results for input(s): GLUCAP in the last 168 hours. Cardiac Enzymes: No results for input(s): CKTOTAL, CKMB, CKMBINDEX, TROPONINI in the last 168 hours. No results for input(s): PROBNP in the last 8760 hours. Coagulation Profile: No results for input(s): INR, PROTIME in the last 168 hours. Thyroid Function Tests: No results for input(s): TSH, T4TOTAL, FREET4, T3FREE, THYROIDAB in the last 72 hours. Lipid Profile: No results for input(s): CHOL, HDL, LDLCALC, TRIG, CHOLHDL, LDLDIRECT in the last 72 hours. Anemia Panel: No results for input(s): VITAMINB12, FOLATE, FERRITIN, TIBC, IRON, RETICCTPCT in the last 72 hours. Urine analysis:    Component Value Date/Time   COLORURINE YELLOW 11/01/2020 2112   APPEARANCEUR CLOUDY (A) 11/01/2020 2112   LABSPEC 1.025 11/01/2020 2112   PHURINE 6.0 11/01/2020 2112   GLUCOSEU NEGATIVE 11/01/2020 2112   HGBUR NEGATIVE 11/01/2020 2112   BILIRUBINUR NEGATIVE 11/01/2020 2112   BILIRUBINUR neg 12/19/2014 [place] 11/01/2020 2112   PROTEINUR 100 (A) 11/01/2020 2112   UROBILINOGEN 0.2 12/19/2014 1157   UROBILINOGEN 0.2 05/23/2014 1301   NITRITE NEGATIVE 11/01/2020 2112   LEUKOCYTESUR MODERATE (A) 11/01/2020 2112   Sepsis Labs: Invalid input(s): PROCALCITONIN, [place]  Microbiology: Recent Results (from the past 240 hour(s))  Urine Culture     Status:  Abnormal   Collection Time: 11/01/20  4:18 PM   Specimen: In/Out Cath Urine  Result Value Ref Range Status   Specimen Description IN/OUT CATH URINE  Final   Special Requests   Final    Immunocompromised Performed at [facility], [address], [place], [place] [address]    Culture >=100,000 COLONIES/mL PSEUDOMONAS AERUGINOSA (A)  Final   Report Status 11/04/2020 FINAL  Final   Organism ID, Bacteria PSEUDOMONAS AERUGINOSA (A)  Final      Susceptibility   Pseudomonas aeruginosa - MIC*    CEFTAZIDIME 16 INTERMEDIATE Intermediate     CIPROFLOXACIN <=0.25 SENSITIVE Sensitive     GENTAMICIN 2 SENSITIVE Sensitive     IMIPENEM 1 SENSITIVE Sensitive     CEFEPIME RESISTANT Resistant     * >=100,000 COLONIES/mL PSEUDOMONAS AERUGINOSA  SARS CORONAVIRUS 2 (TAT 6-24 HRS) Nasopharyngeal Nasopharyngeal Swab     Status: None   Collection Time: 11/01/20 11:14 PM   Specimen: Nasopharyngeal Swab  Result Value Ref Range Status   SARS Coronavirus 2 NEGATIVE NEGATIVE Final    Comment: (NOTE) SARS-CoV-2 target nucleic acids are NOT DETECTED.  The SARS-CoV-2 RNA is generally detectable in upper and lower respiratory specimens during the acute phase of infection. Negative results do not preclude SARS-CoV-2 infection, do not rule out co-infections with other pathogens, and should not be  used as the sole basis for treatment or other patient management decisions. Negative results must be combined with clinical observations, patient history, and epidemiological information. The expected result is Negative.  Fact Sheet for Patients: SugarRoll.be  Fact Sheet for Healthcare Providers: https://www.woods-mathews.com/  This test is not yet approved or cleared by the [place] FDA and  has been authorized for detection and/or diagnosis of SARS-CoV-2 by FDA under an Emergency Use Authorization (EUA). This EUA will remain  in effect (meaning this test can be  used) for the duration of the COVID-19 declaration under Se ction 564(b)(1) of the Act, 21 U.S.C. section 360bbb-3(b)(1), unless the authorization is terminated or revoked sooner.  Performed at [facility], [place] [address] [place], [place] [address]     Radiology Studies: No results found.    Dana Allan, MD [facility]  If 7PM-7AM, please contact night-coverage [email or web address] 11/08/2020, 8:21 PM

## 2020-11-08 NOTE — Progress Notes (Signed)
[facility] ROUNDING NOTE   Subjective:   Interval History: This is a 65 year old lady with history end-stage renal disease status post renal transplant April 2020.  She has a history of hypertension diastolic dysfunction history of CVA presented 917 with passage of dark stools.  She has acute kidney injury with a creatinine that increased to 4.73 creatinine on 10/15/2020 was 3.3.  She has no evidence of obstruction.  Her baseline creatinine appears to be in the twos.  She is followed by Dr. Johnney Ou at [facility].  She has a history of left upper arm AV fistula placement.  She continues on Prograf and prednisone.  Continue torsemide 20 mg twice daily.  Pulmonary edema and effusion per chest x-ray  Blood pressure 141/77 pulse 77 temperature 98.2 O2 sats 97%  Urine output 450 cc 11/07/2020   chest x-ray shows moderate to large right pleural effusion associated with atelectasis increased from prior examination cardiomegaly with diffuse bilateral interstitial edema likely pulmonary edema 11/06/2020.  Sodium 133 potassium 4.3 chloride 104 CO2 17 BUN 77 creatinine 3.79 glucose 90 calcium 9 phosphorus 4.8 hemoglobin 10.5  Ultrasound shows a hypoechoic mass 1.3 cm nonspecific consistent with a hemangioma.  Thickening of the gallbladder wall 1.1 cm.  CT scan shows no evidence of obstruction.  Doppler ultrasound has been ordered of her transplanted kidney.   Elevated tacrolimus level 19.  11/02/2020 and repeat do not think this is a correct reflection of a trough tacrolimus   Objective:  Vital signs in last 24 hours:  Temp:  [97.9 F (36.6 C)-98.2 F (36.8 C)] 98.2 F (36.8 C) (09/24 0446) Pulse Rate:  [70-78] 78 (09/24 0446) Resp:  [18] 18 (09/24 0446) BP: (132-154)/(77-118) 141/77 (09/24 0446) SpO2:  [97 %-98 %] 97 % (09/24 0446) Weight:  [93.3 kg] 93.3 kg (09/24 0231)  Weight change:  Filed Weights   11/05/20 0500 11/06/20 0138 11/08/20 0231  Weight: 93.6 kg 93.5 kg  93.3 kg    Intake/Output: I/O last 3 completed shifts: In: 683 [P.O.:680; I.V.:3] Out: 650 [Urine:650]   Intake/Output this shift:  No intake/output data recorded.  Gen alert, no distress, obese No rash, cyanosis or gangrene Sclera anicteric, throat clear, slightly dry mouth No jvd or bruits Chest clear bilat to bases, no rales/ wheezing RRR no MRG Abd soft ntnd no mass or ascites +bs GU defer MS no joint effusions or deformity Ext 1-2+ ankle/ pretib edema bilat, no wounds or ulcers Neuro is alert, Ox 3 , nf, no asterixis LUA AVF + bruit   Basic Metabolic Panel: Recent Labs  Lab 11/02/20 0632 11/03/20 0703 11/04/20 0352 11/05/20 0507 11/06/20 0439 11/07/20 0233 11/08/20 0559  NA 132*   < > 132* 131* 132* 132* 133*  K 5.0   < > 4.8 4.8 4.6 4.2 4.3  CL 99   < > 101 100 102 103 105  CO2 20*   < > 17* 18* 18* 18* 17*  GLUCOSE 72   < > 96 92 90 88 90  BUN 84*   < > 83* 82* 82* 76* 77*  CREATININE 4.65*   < > 4.72* 4.93* 4.55* 4.24* 3.79*  CALCIUM 9.3   < > 9.3 9.4 9.4 9.0 9.0  MG 2.6*  --   --   --  2.5* 2.6*  --   PHOS 5.5*  --  5.7*  --  5.5* 4.9* 4.8*   < > = values in this interval not displayed.  Liver Function Tests: Recent Labs  Lab 11/01/20 1200 11/03/20 0703 11/04/20 0352 11/05/20 0507 11/06/20 0439 11/07/20 0233 11/08/20 0559  AST 31 22  --  23 29  --   --   ALT 33 27  --  23 23  --   --   ALKPHOS 115 106  --  104 105  --   --   BILITOT 1.1 0.9  --  0.7 0.9  --   --   PROT 6.7 5.8*  --  5.9* 6.1*  --   --   ALBUMIN 3.2* 2.7* 2.7* 2.7* 2.8*  2.9* 2.7* 2.6*    Recent Labs  Lab 11/01/20 1200  LIPASE 26    No results for input(s): AMMONIA in the last 168 hours.  CBC: Recent Labs  Lab 11/02/20 0632 11/03/20 0703 11/05/20 0507 11/06/20 0439 11/07/20 0233  WBC 3.2* 3.1* 3.6* 3.4* 2.8*  HGB 10.7* 9.9* 10.5* 10.6* 10.5*  HCT 33.2* 31.0* 32.5* 33.8* 32.7*  MCV 92.7 91.7 91.3 92.6 91.9  PLT 99* 91* PLATELET CLUMPS NOTED ON SMEAR,  UNABLE TO ESTIMATE 86* 77*     Cardiac Enzymes: No results for input(s): CKTOTAL, CKMB, CKMBINDEX, TROPONINI in the last 168 hours.  BNP: Invalid input(s): POCBNP  CBG: No results for input(s): GLUCAP in the last 168 hours.  Microbiology: Results for orders placed or performed during the hospital encounter of 11/01/20  Urine Culture     Status: Abnormal   Collection Time: 11/01/20  4:18 PM   Specimen: In/Out Cath Urine  Result Value Ref Range Status   Specimen Description IN/OUT CATH URINE  Final   Special Requests   Final    Immunocompromised Performed at [facility], [place] [address], [place], [place] [address]    Culture >=100,000 COLONIES/mL PSEUDOMONAS AERUGINOSA (A)  Final   Report Status 11/04/2020 FINAL  Final   Organism ID, Bacteria PSEUDOMONAS AERUGINOSA (A)  Final      Susceptibility   Pseudomonas aeruginosa - MIC*    CEFTAZIDIME 16 INTERMEDIATE Intermediate     CIPROFLOXACIN <=0.25 SENSITIVE Sensitive     GENTAMICIN 2 SENSITIVE Sensitive     IMIPENEM 1 SENSITIVE Sensitive     CEFEPIME RESISTANT Resistant     * >=100,000 COLONIES/mL PSEUDOMONAS AERUGINOSA  SARS CORONAVIRUS 2 (TAT 6-24 HRS) Nasopharyngeal Nasopharyngeal Swab     Status: None   Collection Time: 11/01/20 11:14 PM   Specimen: Nasopharyngeal Swab  Result Value Ref Range Status   SARS Coronavirus 2 NEGATIVE NEGATIVE Final    Comment: (NOTE) SARS-CoV-2 target nucleic acids are NOT DETECTED.  The SARS-CoV-2 RNA is generally detectable in upper and lower respiratory specimens during the acute phase of infection. Negative results do not preclude SARS-CoV-2 infection, do not rule out co-infections with other pathogens, and should not be used as the sole basis for treatment or other patient management decisions. Negative results must be combined with clinical observations, patient history, and epidemiological information. The expected result is Negative.  Fact Sheet for  Patients: SugarRoll.be  Fact Sheet for Healthcare Providers: https://www.woods-mathews.com/  This test is not yet approved or cleared by the [place] FDA and  has been authorized for detection and/or diagnosis of SARS-CoV-2 by FDA under an Emergency Use Authorization (EUA). This EUA will remain  in effect (meaning this test can be used) for the duration of the COVID-19 declaration under Se ction 564(b)(1) of the Act, 21 U.S.C. section 360bbb-3(b)(1), unless the authorization is terminated or revoked sooner.  Performed at [facility], [place] [address] [place], [place] [address]     Coagulation Studies: No results for input(s): LABPROT, INR in the last 72 hours.  Urinalysis: No results for input(s): COLORURINE, LABSPEC, PHURINE, GLUCOSEU, HGBUR, BILIRUBINUR, KETONESUR, PROTEINUR, UROBILINOGEN, NITRITE, LEUKOCYTESUR in the last 72 hours.  Invalid input(s): APPERANCEUR     Imaging: DG Chest 2 View  Result Date: 11/06/2020 CLINICAL DATA:  Shortness of breath EXAM: CHEST - 2 VIEW COMPARISON:  11/01/2020 FINDINGS: Moderate to large right pleural effusion and associated atelectasis or consolidation, increased compared to prior examination. Mild diffuse interstitial pulmonary opacity. Unchanged gross cardiomegaly. IMPRESSION: 1. Moderate to large right pleural effusion and associated atelectasis or consolidation, increased compared to prior examination. 2. Cardiomegaly and diffuse bilateral interstitial pulmonary opacity, likely edema. Electronically Signed   By: Eddie Candle M.D.   On: 11/06/2020 13:04     Medications:    sodium chloride Stopped (11/06/20 0007)   meropenem (MERREM) IV 500 mg (11/07/20 2209)    citalopram  10 mg Oral Daily   gabapentin  100 mg Oral QHS   hydroxychloroquine  200 mg Oral Daily   influenza vaccine adjuvanted  0.5 mL Intramuscular Tomorrow-1000   melatonin  5 mg Oral QHS   pantoprazole (PROTONIX) IV   40 mg Intravenous Q12H   predniSONE  5 mg Oral Q lunch   rosuvastatin  5 mg Oral q1800   sodium chloride flush  3 mL Intravenous Q12H   tacrolimus  0.5 mg Oral Daily   tacrolimus  1 mg Oral QHS   torsemide  20 mg Oral BID   valGANciclovir  450 mg Oral Once per day on Mon Thu   sodium chloride, acetaminophen **OR** acetaminophen, LORazepam, ondansetron **OR** ondansetron (ZOFRAN) IV  Assessment/ Plan:  AKI on CKD IV - b/l creat is 3.0- 3.4 from July 2022, egfr 14- 8m/min.  Pt admitted w/ abd pain, dark stools and creat here is 4.7 on admit 9/17. Appears to have UTI. Imaging is w/o obstruction, UA c/w infection.   We will repeat trough tacrolimus level.  Looks like she is losing her allograft.     She does have some anasarca with trace abdominal ascites.   She has a large pleural effusion.  Pulmonary edema on x-ray.  We will continue diuresis. UTI/pyelo - likely of transplant kidney given UA and CT findings. On meropenem.    GIB - FOBT +, Hb 10.3, GI consulting HFrEF - EF is 20-25% from last echo. Holding torsemide w/ ^[place]  Would restart torsemide HTN - bidil, toprol xl on hold due to normotension and bradycardia CAD sp PCI/ DES to LAD 9/20 Hx CMV viremia -continues on valganciclovir renally adjusted dosing  Renal function is improving.  She can follow-up with Dr. UHollie Salkas an outpatient.  I do believe that she is eventually can lose her allograft.  And preparations need to be made to restart hemodialysis in the near future.   LOS: [id number] _1 :56 AM

## 2020-11-08 NOTE — Progress Notes (Signed)
Talked to patient about IR refusal. Patient stated she did not fully understand the procedure and don't know the doctor. Pt verbalized she would like to fully understand the procedure, know the doctor performing it and pt said she might consider it.

## 2020-11-09 DIAGNOSIS — N179 Acute kidney failure, unspecified: Secondary | ICD-10-CM | POA: Diagnosis not present

## 2020-11-09 DIAGNOSIS — I2510 Atherosclerotic heart disease of native coronary artery without angina pectoris: Secondary | ICD-10-CM | POA: Diagnosis not present

## 2020-11-09 DIAGNOSIS — N12 Tubulo-interstitial nephritis, not specified as acute or chronic: Secondary | ICD-10-CM | POA: Diagnosis not present

## 2020-11-09 DIAGNOSIS — F419 Anxiety disorder, unspecified: Secondary | ICD-10-CM | POA: Diagnosis not present

## 2020-11-09 DIAGNOSIS — E785 Hyperlipidemia, unspecified: Secondary | ICD-10-CM

## 2020-11-09 LAB — CBC WITH DIFFERENTIAL/PLATELET
Abs Immature Granulocytes: 0.19 10*3/uL — ABNORMAL HIGH (ref 0.00–0.07)
Basophils Absolute: 0 10*3/uL (ref 0.0–0.1)
Basophils Relative: 1 %
Eosinophils Absolute: 0 10*3/uL (ref 0.0–0.5)
Eosinophils Relative: 1 %
HCT: 35.8 % — ABNORMAL LOW (ref 36.0–46.0)
Hemoglobin: 11.5 g/dL — ABNORMAL LOW (ref 12.0–15.0)
Immature Granulocytes: 7 %
Lymphocytes Relative: 13 %
Lymphs Abs: 0.4 10*3/uL — ABNORMAL LOW (ref 0.7–4.0)
MCH: 29.4 pg (ref 26.0–34.0)
MCHC: 32.1 g/dL (ref 30.0–36.0)
MCV: 91.6 fL (ref 80.0–100.0)
Monocytes Absolute: 0.2 10*3/uL (ref 0.1–1.0)
Monocytes Relative: 6 %
Neutro Abs: 2 10*3/uL (ref 1.7–7.7)
Neutrophils Relative %: 72 %
Platelets: 69 10*3/uL — ABNORMAL LOW (ref 150–400)
RBC: 3.91 MIL/uL (ref 3.87–5.11)
RDW: 22.1 % — ABNORMAL HIGH (ref 11.5–15.5)
WBC: 2.8 10*3/uL — ABNORMAL LOW (ref 4.0–10.5)
nRBC: 0 % (ref 0.0–0.2)

## 2020-11-09 LAB — RENAL FUNCTION PANEL
Albumin: 3 g/dL — ABNORMAL LOW (ref 3.5–5.0)
Anion gap: 11 (ref 5–15)
BUN: 74 mg/dL — ABNORMAL HIGH (ref 8–23)
CO2: 19 mmol/L — ABNORMAL LOW (ref 22–32)
Calcium: 9.3 mg/dL (ref 8.9–10.3)
Chloride: 102 mmol/L (ref 98–111)
Creatinine, Ser: 3.62 mg/dL — ABNORMAL HIGH (ref 0.44–1.00)
GFR, Estimated: 13 mL/min — ABNORMAL LOW (ref >60)
Glucose, Bld: 91 mg/dL (ref 70–99)
Phosphorus: 4.5 mg/dL (ref 2.5–4.6)
Potassium: 4.2 mmol/L (ref 3.5–5.1)
Sodium: 132 mmol/L — ABNORMAL LOW (ref 135–145)

## 2020-11-09 LAB — MAGNESIUM: Magnesium: 2.5 mg/dL — ABNORMAL HIGH (ref 1.7–2.4)

## 2020-11-09 MED ORDER — TACROLIMUS 0.5 MG PO CAPS
0.5000 mg | ORAL_CAPSULE | Freq: Every day | ORAL | Status: DC
  Administered 2020-11-09 – 2020-11-11 (×3): 0.5 mg via ORAL
  Filled 2020-11-09 (×4): qty 1

## 2020-11-09 NOTE — TOC Progression Note (Signed)
Transition of Care [facility]) - Progression Note    Patient Details  Name: Jenna [name] MRN: [id number] Date of Birth: [birth year]  Transition [facility] CM/SW [place], LCSW Phone Number:[phone] 11/09/2020, 2:18 PM  Clinical Narrative:     Attempted to call pt's daughter to provide the 2 bed offers that have come back however had to leave a message.  TOC team will continue to assist with discharge planning needs.    Expected Discharge Plan: [place] Barriers to Discharge: Continued Medical Work up  Expected Discharge Plan and Services Expected Discharge Plan: [place]   Discharge Planning Services: CM Consult   Living arrangements for the past 2 months: Single Family Home                   DME Agency: NA       HH Arranged: NA           Social Determinants of Health (SDOH) Interventions    Readmission Risk Interventions Readmission Risk Prevention Plan 11/05/2020 08/26/2020  Transportation Screening Complete Complete  PCP or Specialist Appt within 3-5 Days Complete -  HRI or [place] Complete Complete  Social Work Consult for [place] Planning/Counseling Complete Complete  Palliative Care Screening Not Applicable Not Applicable  Medication Review Press photographer) Complete Complete  Some recent data might be hidden

## 2020-11-09 NOTE — Plan of Care (Signed)
Patient progressing 

## 2020-11-09 NOTE — Progress Notes (Signed)
Vital signs: Stable blood pressure 148/89 pulse 70 temperature 97.6 O2 sats 90% room air  Tacrolimus level 19.  Still looks a little high.  We will decrease tacrolimus to 0.5 mg twice daily.  We will need to continue to follow tacrolimus levels.  Creatinine continues to improve  Urine output good  Continue diuretics and immunosuppressants.  Will need trough tacrolimus level 11/11/2020.  Goal tacrolimus level should be 3 to 5 trough  Will sign off at this point.

## 2020-11-09 NOTE — Progress Notes (Signed)
PROGRESS NOTE    Jenna [name]  NUU:[id number] DOB: December 19, 1955 DOA: 11/01/2020 PCP: [name], Betty G, MD   Brief Narrative:  The patient is a 65 year old elderly chronically ill-appearing African-American female with a past medical history significant for but limited to ESRD status post DD KT in 2020 now with CKD stage IV, systolic CHF with an EF of 20 to 25%, history of CAD and STEMI status post DES to the LAD in 10/2018, PAF longer on anticoagulation, history of CMV viremia, AICD placement, hyperlipidemia, anxiety and depression as well as other comorbidities who presented with melena and abdominal discomfort along with decreased urinary output of.  CT scan on 917 was done with an age-indeterminate mildly associated pelvic [place], prominent right ureter with out evidence of stone and a thickened gallbladder wall.  Right upper quadrant ultrasound was done on 11/02/2018 with hyperechoic liver mass measuring about 1.3 cm as well as a thickened gallbladder without evidence of stone, sludge or Murphy sign.  MRI liver done on 11/03/2020 did not confirm the liver mass but did show diffuse wall thickening of the gallbladder and diffuse soft tissue anasarca with trace ascites.  Ultrasound of the transplant kidney on 11/04/2018 was unremarkable.  Creatinine remained elevated so nephrology was consulted and following and they were concerned that she may be losing her allograft but recommended no imminent need for hemodialysis.  Her torsemide has been resumed and she was also started on IV meropenem for Pseudomonas UTI and pyelonephritis of the transplanted kidney.  Renal function continues improve daily and is closer to her baseline is now 3.62.  Is been no recent tacrolimus level however nephrology has now decreased her tacrolimus dosing.  Palliative care has met with the patient and after goals of care discussion the patient wishes to continue treatment efforts.  PT OT recommending SNF and patient's daughter to choose  that offers this patient is agreeable to SNF.  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 twice daily and continue to follow tacrolimus levels with trough tacrolimus level at 11/11/2020 the goal tacrolimus level to be 3-5 trough -Her urine output continues to be good and creatinine continue to improve -PT OT recommending SNF and once bed is selected she can be likely discharged  Pseudomonas UTI/pyelonephritis with transplanted kidney -There is no convincing radiological clinical evidence but she is a transplanted kidney -She has been on IV meropenem and will continue; she refused yesterday's dose but resumed today and  this is day 5 -Continue with immunosuppression drugs with tacrolimus 0.5 mg p.o. twice daily, hydroxychloroquine 200 g p.o. daily and prednisone 5 mg p.o. daily with lunch  Positive Hemoccult and Melena -Hemoglobin/hematocrit has  been stable and repeat now improved to 11.5/35.8 -GI evaluated and recommending PPI twice daily and outpatient with them for possible EGD -Continue to monitor for signs and symptoms of bleeding; currently no overt bleeding noted now -Repeat CBC in a.m.  Acute on Chronic Systolic CHF with an EF of 20 to 25% -Follows with [place] with shortness of breath, dyspnea exertion as well anasarca and a large right-sided pleural effusion -BNP was greater than 4500.0; repeat BMP in a.m. -Torsemide has been resumed by nephrology -We will consider resuming BiDil at a lower dose of blood pressure stable with torsemide -Her Toprol-XL was held in setting of borderline bradycardia -We will need strict I's and O's and daily weights and continue monitor fluid status daily; she is +5.43 L since admission weight is up 6 pounds from 198.5 to 204.8 -She will need a repeat chest x-ray in a.m and an ambulatory home O2 screen prior to discharge  Large right-sided pleural effusion -The setting of CHF and renal failure -Patient is not keen on ultrasound-guided thoracentesis for diagnostic and therapeutic purposes -Pleural effusion is chronic and torsemide has been restarted at 20 mg p.o. twice daily -Repeat chest x-ray in a.m. and will need an amatory home O2 screen  CAD status post STEMI with DES to LAD in 11/04/2020 -She denies any current chest pain -Troponin was elevated and went from 107 and trended up to 120 but she denied any chest pain and likely demand ischemia -Continue Rosuvastatin 5 mg po Daily  -Toprol-XL is currently on hold due to borderline bradycardia  Proximal Atrial Fibrillation -Currently was in sinus rhythm and had rate control -She is not on anticoagulation due to GI bleed -Continue telemetry monitoring -Resume Toprol XL likely in the morning if heart rate is stable  Pancytopenia -Relatively stable -In the setting of her immunosuppressed state -Patient's WBC is 2.8,  hemoglobin/hematocrit is 11.5/35.8 and platelet count is now 69 -Continue monitor and trend and repeat CBC in a.m.  Hypervolemic Hyponatremia -In the setting of CHF and renal failure -Diuresis as above and will continue torsemide -Sodium is been relatively stable and trended between 131 and 133; now sodium was 132 this a.m. -Continue monitor and trend and repeat CMP in the AM  Questionable liver mass -Noted on ultrasound but could not confirm on MRI although MRI was without con -We will need repeat imaging in about 3 months  History of CMV viremia -Continue with valganciclovir for 50 mg p.o. twice weekly once per day on Mondays and Thursdays  Generalized weakness and physical deconditioning -She uses a walker at baseline lives alone -PT OT evaluated and recommending skilled nursing facility and patient agreeable; she has been faxed out as to bed offers and social worker has been consulted for assistance with placement attempted to call the patient daughter by the 2 but offered to have come back however patient's daughter did not pick up today when I was in the room the patient or when the social worker called  Anxiety and depression -Feel like she has a panic attack yesterday -Likely in the setting of her CHF and anasarca -Continue with diuresis -Continue with low-dose lorazepam 0.5 mg every 12 as needed anxiety and continue with citalopram 10 mg p.o. daily -We will continue home low-dose gabapentin  Goals of care -Has significant comorbidities and long-term prognosis is concerning -Patient's daughter and brother prefer her to be full code with full scope of care -Palliative care consulted and patient wishes to return remain full scope  Right ankle tenderness -Resolved  Obesity -Complicates overall prognosis and care -Estimated body mass index is 32.08 kg/m as calculated from the following:   Height as of this encounter: '5\' 7"'  (1.702 m).   Weight as of this encounter: 92.9  kg. -Weight loss and Dietary Counseling given   DVT prophylaxis: SCDs given her melena and concern for GI bleed Code Status: FULL CODE Family Communication: No family present at bedside and 1 daughter was called she did not pick up Disposition Plan: Anticipating discharge to SNF in next 24 to 48 hours  Status is: Inpatient  Remains inpatient appropriate because:Unsafe d/c plan, IV treatments appropriate due to intensity of illness or inability to take PO, and Inpatient level of care appropriate due to severity of illness  Dispo: The patient is from: Home              Anticipated d/c is to: SNF              Patient currently is not medically stable to d/c.   Difficult to place patient No  Consultants:  Gastroenterology Nephrology Palliative care medicine Interventional radiology  Procedures: None  Antimicrobials:  Anti-infectives (From admission, onward)    Start     Dose/Rate Route Frequency Ordered Stop   11/06/20 0900  valGANciclovir (VALCYTE) 450 MG tablet TABS 450 mg        450 mg Oral Once per day on Mon Thu 11/04/20 1046     11/04/20 1015  meropenem (MERREM) 500 mg in sodium chloride 0.9 % 100 mL IVPB        500 mg 200 mL/hr over 30 Minutes Intravenous Every 12 hours 11/04/20 0925     11/03/20 1400  ceFEPIme (MAXIPIME) 1 g in sodium chloride 0.9 % 100 mL IVPB  Status:  Discontinued        1 g 200 mL/hr over 30 Minutes Intravenous Every 24 hours 11/03/20 1258 11/04/20 0925   11/03/20 1000  valGANciclovir (VALCYTE) 450 MG tablet TABS 450 mg  Status:  Discontinued        450 mg Oral Every other day 11/01/20 2320 11/04/20 1046   11/02/20 2200  cefTRIAXone (ROCEPHIN) 1 g in sodium chloride 0.9 % 100 mL IVPB  Status:  Discontinued        1 g 200 mL/hr over 30 Minutes Intravenous Daily at bedtime 11/01/20 2318 11/03/20 1258   11/02/20 1030  hydroxychloroquine (PLAQUENIL) tablet 200 mg        200 mg Oral Daily 11/01/20 2320     11/01/20 2200  cefTRIAXone (ROCEPHIN) 1 g in  sodium chloride 0.9 % 100 mL IVPB        1 g 200 mL/hr over 30 Minutes Intravenous  Once 11/01/20 2145 11/01/20 2230        Subjective: Seen and examined in bedside and thinks her breathing is little bit better but still states that she is unable to lay flat.  No chest pain or shortness of breath.  She [facility] but when she found her renal function was improving she was then agreeable to stay.  Wanted to speak with the nephrologist.  Denies any other concerns or complaints at this time and thinks she is doing better than yesterday.  Objective: Vitals:   11/09/20 0800 11/09/20 0900 11/09/20 1200 11/09/20 1500  BP: (!) 148/89  (!) 157/92 (!) 141/86  Pulse: 72 75  75  Resp: '18  20 20  ' Temp: 97.6 F (36.4 C)  97.7 F (36.5 C) 98 F (36.7 C)  TempSrc: Oral   Oral  SpO2: 100%  100% 99%  Weight:      Height:        Intake/Output Summary (Last 24 hours) at 11/09/2020 1651 Last data filed at 11/08/2020 1700 Gross per 24 hour  Intake 240 ml  Output --  Net 240 ml   Filed Weights   11/06/20 0138 11/08/20 0231 11/09/20 0500  Weight: 93.5 kg 93.3 kg 92.9 kg   Examination: Physical Exam:  Constitutional: WN/WD chronically ill-appearing obese African-American female currently in no acute distress appears a little withdrawn and somewhat anxious Eyes: Lids and conjunctivae normal, sclerae anicteric  ENMT: External Ears, Nose appear normal. Grossly normal hearing. Mucous membranes are moist.  Neck: Appears normal, supple, no cervical masses, normal ROM, no appreciable thyromegaly; mild JVD Respiratory: Diminished to auscultation bilaterally with coarse breath sounds some crackles worse on the right compared to left, no wheezing, rales, rhonchi. Normal respiratory effort and patient is not tachypenic. No accessory muscle use.  Cardiovascular: RRR, no murmurs / rubs / gallops. S1 and S2 auscultated.  He has 1+ lower extremity edema Abdomen: Soft,  non-tender, distended secondary body habitus.  Bowel sounds positive.  GU: Deferred. Musculoskeletal: No clubbing / cyanosis of digits/nails. No joint deformity upper and lower extremities. Skin: No rashes, lesions, ulcers on limited skin evaluation. No induration; Warm and dry.  Neurologic: CN 2-12 grossly intact with no focal deficits. Romberg sign and cerebellar reflexes not assessed.  Psychiatric: Normal judgment and insight. Alert and oriented x 3.  Anxious mood and appropriate affect.   Data Reviewed: I have personally reviewed following labs and imaging studies  CBC: Recent Labs  Lab 11/03/20 0703 11/05/20 0507 11/06/20 0439 11/07/20 0233 11/09/20 0539  WBC 3.1* 3.6* 3.4* 2.8* 2.8*  NEUTROABS  --   --   --   --  2.0  HGB 9.9* 10.5* 10.6* 10.5* 11.5*  HCT 31.0* 32.5* 33.8* 32.7* 35.8*  MCV 91.7 91.3 92.6 91.9 91.6  PLT 91* PLATELET CLUMPS NOTED ON SMEAR, UNABLE TO ESTIMATE 86* 77* 69*   Basic Metabolic Panel: Recent Labs  Lab 11/04/20 0352 11/05/20 0507 11/06/20 0439 11/07/20 0233 11/08/20 0559 11/09/20 0539  NA 132* 131* 132* 132* 133* 132*  K 4.8 4.8 4.6 4.2 4.3 4.2  CL 101 100 102 103 105 102  CO2 17* 18* 18* 18* 17* 19*  GLUCOSE 96 92 90 88 90 91  BUN 83* 82* 82* 76* 77* 74*  CREATININE 4.72* 4.93* 4.55* 4.24* 3.79* 3.62*  CALCIUM 9.3 9.4 9.4 9.0 9.0 9.3  MG  --   --  2.5* 2.6*  --  2.5*  PHOS 5.7*  --  5.5* 4.9* 4.8* 4.5   GFR: Estimated Creatinine Clearance: 18.1 mL/min (A) (by C-G formula based on SCr of 3.62 mg/dL (H)). Liver Function Tests: Recent Labs  Lab 11/03/20 0703 11/04/20 0352 11/05/20 0507 11/06/20 0439 11/07/20 0233 11/08/20 0559 11/09/20 0539  AST 22  --  23 29  --   --   --   ALT 27  --  23 23  --   --   --   ALKPHOS 106  --  104 105  --   --   --  BILITOT 0.9  --  0.7 0.9  --   --   --   PROT 5.8*  --  5.9* 6.1*  --   --   --   ALBUMIN 2.7*   < > 2.7* 2.8*  2.9* 2.7* 2.6* 3.0*   < > = values in this interval not displayed.    No results for input(s): LIPASE, AMYLASE in the last 168 hours. No results for input(s): AMMONIA in the last 168 hours. Coagulation Profile: No results for input(s): INR, PROTIME in the last 168 hours. Cardiac Enzymes: No results for input(s): CKTOTAL, CKMB, CKMBINDEX, TROPONINI in the last 168 hours. BNP (last 3 results) No results for input(s): PROBNP in the last 8760 hours. HbA1C: No results for input(s): HGBA1C in the last 72 hours. CBG: No results for input(s): GLUCAP in the last 168 hours. Lipid Profile: No results for input(s): CHOL, HDL, LDLCALC, TRIG, CHOLHDL, LDLDIRECT in the last 72 hours. Thyroid Function Tests: No results for input(s): TSH, T4TOTAL, FREET4, T3FREE, THYROIDAB in the last 72 hours. Anemia Panel: No results for input(s): VITAMINB12, FOLATE, FERRITIN, TIBC, IRON, RETICCTPCT in the last 72 hours. Sepsis Labs: No results for input(s): PROCALCITON, LATICACIDVEN in the last 168 hours.  Recent Results (from the past 240 hour(s))  Urine Culture     Status: Abnormal   Collection Time: 11/01/20  4:18 PM   Specimen: In/Out Cath Urine  Result Value Ref Range Status   Specimen Description IN/OUT CATH URINE  Final   Special Requests   Final    Immunocompromised Performed at [facility], [address], [place], [place] [address]    Culture >=100,000 COLONIES/mL PSEUDOMONAS AERUGINOSA (A)  Final   Report Status 11/04/2020 FINAL  Final   Organism ID, Bacteria PSEUDOMONAS AERUGINOSA (A)  Final      Susceptibility   Pseudomonas aeruginosa - MIC*    CEFTAZIDIME 16 INTERMEDIATE Intermediate     CIPROFLOXACIN <=0.25 SENSITIVE Sensitive     GENTAMICIN 2 SENSITIVE Sensitive     IMIPENEM 1 SENSITIVE Sensitive     CEFEPIME RESISTANT Resistant     * >=100,000 COLONIES/mL PSEUDOMONAS AERUGINOSA  SARS CORONAVIRUS 2 (TAT 6-24 HRS) Nasopharyngeal Nasopharyngeal Swab     Status: None   Collection Time: 11/01/20 11:14 PM   Specimen: Nasopharyngeal Swab  Result  Value Ref Range Status   SARS Coronavirus 2 NEGATIVE NEGATIVE Final    Comment: (NOTE) SARS-CoV-2 target nucleic acids are NOT DETECTED.  The SARS-CoV-2 RNA is generally detectable in upper and lower respiratory specimens during the acute phase of infection. Negative results do not preclude SARS-CoV-2 infection, do not rule out co-infections with other pathogens, and should not be used as the sole basis for treatment or other patient management decisions. Negative results must be combined with clinical observations, patient history, and epidemiological information. The expected result is Negative.  Fact Sheet for Patients: SugarRoll.be  Fact Sheet for Healthcare Providers: https://www.woods-mathews.com/  This test is not yet approved or cleared by the [place] FDA and  has been authorized for detection and/or diagnosis of SARS-CoV-2 by FDA under an Emergency Use Authorization (EUA). This EUA will remain  in effect (meaning this test can be used) for the duration of the COVID-19 declaration under Se ction 564(b)(1) of the Act, 21 U.S.C. section 360bbb-3(b)(1), unless the authorization is terminated or revoked sooner.  Performed at [facility], [place] [address], [place], [place] [address]     RN Pressure Injury Documentation:  Estimated body mass index is 32.08 kg/m as calculated from the following:   Height as of this encounter: '5\' 7"'  (1.702 m).   Weight as of this encounter: 92.9 kg.  Malnutrition Type:   Malnutrition Characteristics:   Nutrition Interventions:     Radiology Studies: No results found.  Scheduled Meds:  citalopram  10 mg Oral Daily   gabapentin  100 mg Oral QHS   hydroxychloroquine  200 mg Oral Daily   influenza vaccine adjuvanted  0.5 mL Intramuscular Tomorrow-1000   melatonin  5 mg Oral QHS   pantoprazole (PROTONIX) IV  40 mg Intravenous Q12H   predniSONE  5 mg Oral Q lunch   rosuvastatin   5 mg Oral q1800   sodium chloride flush  3 mL Intravenous Q12H   tacrolimus  0.5 mg Oral Daily   tacrolimus  0.5 mg Oral QHS   torsemide  20 mg Oral BID   valGANciclovir  450 mg Oral Once per day on Mon Thu   Continuous Infusions:  sodium chloride Stopped (11/06/20 0007)   meropenem (MERREM) IV 500 mg (11/09/20 0919)    LOS: 7 days   Kerney Elbe, DO [facility] PAGER is on AMION  If 7PM-7AM, please contact night-coverage [email or web address]

## 2020-11-09 NOTE — Progress Notes (Signed)
Pt refused 2200 abx Meropenem stating that she doesn't feel well and believes it is b/c of the IV abx.  Pt educated on importance of medication for her UTI.  Says she would like to explore options and speak with AM rounding MD.  Pharmacy contacted to explore alternatives, but PO Cipro has renal side effects.  MD on call, Dr. Marthenia Rolling, notified of situation.

## 2020-11-10 ENCOUNTER — Inpatient Hospital Stay (HOSPITAL_COMMUNITY): Payer: Medicare Other

## 2020-11-10 DIAGNOSIS — N179 Acute kidney failure, unspecified: Secondary | ICD-10-CM | POA: Diagnosis not present

## 2020-11-10 DIAGNOSIS — E785 Hyperlipidemia, unspecified: Secondary | ICD-10-CM | POA: Diagnosis not present

## 2020-11-10 DIAGNOSIS — I2510 Atherosclerotic heart disease of native coronary artery without angina pectoris: Secondary | ICD-10-CM | POA: Diagnosis not present

## 2020-11-10 DIAGNOSIS — N12 Tubulo-interstitial nephritis, not specified as acute or chronic: Secondary | ICD-10-CM | POA: Diagnosis not present

## 2020-11-10 LAB — COMPREHENSIVE METABOLIC PANEL
ALT: 20 U/L (ref 0–44)
AST: 24 U/L (ref 15–41)
Albumin: 2.9 g/dL — ABNORMAL LOW (ref 3.5–5.0)
Alkaline Phosphatase: 93 U/L (ref 38–126)
Anion gap: 13 (ref 5–15)
BUN: 75 mg/dL — ABNORMAL HIGH (ref 8–23)
CO2: 18 mmol/L — ABNORMAL LOW (ref 22–32)
Calcium: 9.3 mg/dL (ref 8.9–10.3)
Chloride: 103 mmol/L (ref 98–111)
Creatinine, Ser: 3.4 mg/dL — ABNORMAL HIGH (ref 0.44–1.00)
GFR, Estimated: 14 mL/min — ABNORMAL LOW (ref >60)
Glucose, Bld: 78 mg/dL (ref 70–99)
Potassium: 4.4 mmol/L (ref 3.5–5.1)
Sodium: 134 mmol/L — ABNORMAL LOW (ref 135–145)
Total Bilirubin: 1.5 mg/dL — ABNORMAL HIGH (ref 0.3–1.2)
Total Protein: 6.3 g/dL — ABNORMAL LOW (ref 6.5–8.1)

## 2020-11-10 LAB — CBC WITH DIFFERENTIAL/PLATELET
Abs Immature Granulocytes: 0 10*3/uL (ref 0.00–0.07)
Basophils Absolute: 0 10*3/uL (ref 0.0–0.1)
Basophils Relative: 0 %
Eosinophils Absolute: 0 10*3/uL (ref 0.0–0.5)
Eosinophils Relative: 1 %
HCT: 33.1 % — ABNORMAL LOW (ref 36.0–46.0)
Hemoglobin: 11 g/dL — ABNORMAL LOW (ref 12.0–15.0)
Lymphocytes Relative: 18 %
Lymphs Abs: 0.4 10*3/uL — ABNORMAL LOW (ref 0.7–4.0)
MCH: 29.9 pg (ref 26.0–34.0)
MCHC: 33.2 g/dL (ref 30.0–36.0)
MCV: 89.9 fL (ref 80.0–100.0)
Metamyelocytes Relative: 2 %
Monocytes Absolute: 0.2 10*3/uL (ref 0.1–1.0)
Monocytes Relative: 9 %
Neutro Abs: 1.6 10*3/uL — ABNORMAL LOW (ref 1.7–7.7)
Neutrophils Relative %: 70 %
Platelets: 71 10*3/uL — ABNORMAL LOW (ref 150–400)
RBC: 3.68 MIL/uL — ABNORMAL LOW (ref 3.87–5.11)
RDW: 21.7 % — ABNORMAL HIGH (ref 11.5–15.5)
WBC: 2.3 10*3/uL — ABNORMAL LOW (ref 4.0–10.5)
nRBC: 0 % (ref 0.0–0.2)

## 2020-11-10 LAB — MAGNESIUM: Magnesium: 2.4 mg/dL (ref 1.7–2.4)

## 2020-11-10 LAB — BRAIN NATRIURETIC PEPTIDE: B Natriuretic Peptide: >4500 pg/mL — ABNORMAL HIGH (ref 0.0–100.0)

## 2020-11-10 LAB — PHOSPHORUS: Phosphorus: 4.6 mg/dL (ref 2.5–4.6)

## 2020-11-10 MED ORDER — LEVOFLOXACIN 500 MG PO TABS
500.0000 mg | ORAL_TABLET | Freq: Once | ORAL | Status: AC
  Administered 2020-11-10: 500 mg via ORAL
  Filled 2020-11-10: qty 1

## 2020-11-10 NOTE — Progress Notes (Signed)
Physical Therapy Treatment Patient Details Name: Jenna [name] MRN: [id number] DOB: 1956/02/06 Today's Date: 11/10/2020   History of Present Illness 65 y/o female presented to ED on 9/17 for constipation, dark stool, and abdominal discomfort (RLQ). CT abdomen pelvis shows mild increased attenuation in fat adjacent to R renal pelvis and gallbladder wall is thickened, fascinating adjacent to the gallbladder. Concern for possible pyelonephritis of transplanted kidney. 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    PT Comments    Pt endorses R ankle burning-type pain and significant fatigue, but agreeable to OOB mobility. Pt ambulatory for short room distance, requiring min assist overall for stand assist and steadying once ambulating. Pt maintained HR 70s-80s bpm during gait, pt mainly limited by severe fatigue stating "my EF is only 20%". PT to continue to follow and progress mobility as tolerated.     Recommendations for follow up therapy are one component of a multi-disciplinary discharge planning process, led by the attending physician.  Recommendations may be updated based on patient status, additional functional criteria and insurance authorization.  Follow Up Recommendations  SNF     Equipment Recommendations  3in1 (PT);Other (comment) (rollator)    Recommendations for Other Services       Precautions / Restrictions Precautions Precautions: Fall Restrictions Weight Bearing Restrictions: No     Mobility  Bed Mobility Overal bed mobility: Needs Assistance Bed Mobility: Supine to Sit;Sit to Supine     Supine to sit: Min guard Sit to supine: Mod assist   General bed mobility comments: close guard for moving supine>sit for safety, increased time and use of bedrails. Mod assist for return  to supine for LE lifting into bed, positioning.    Transfers Overall transfer level: Needs assistance Equipment used: Rolling walker (2 wheeled) Transfers: Sit to/from Stand Sit to Stand: Min assist         General transfer comment: Min assist for power up, rise via posterior facilitation, steadying. cuing for safe hand placement when rising/sitting.  Ambulation/Gait Ambulation/Gait assistance: Min assist Gait Distance (Feet): 25 Feet Assistive device: Rolling walker (2 wheeled) Gait Pattern/deviations: Step-through pattern;Decreased stride length;Trunk flexed;Shuffle Gait velocity: decr   General Gait Details: assist to steady, guide RW during directional changes. Verbal cuing for upright posture   Stairs             Wheelchair Mobility    Modified Rankin (Stroke Patients Only)       Balance Overall balance assessment: Needs assistance   Sitting balance-Leahy Scale: Fair     Standing balance support: Bilateral upper extremity supported;During functional activity Standing balance-Leahy Scale: Poor Standing balance comment: reliant on UE support of RW                            Cognition Arousal/Alertness: Awake/alert Behavior During Therapy: Flat affect Overall Cognitive Status: No family/caregiver present to determine baseline cognitive functioning Area of Impairment: Memory;Following commands;Problem solving                     Memory: Decreased short-term memory Following Commands: Follows one step commands with increased time     Problem Solving: Slow processing General Comments: Pt requiring increased processing time, repeated cues to open eyes during session.      Exercises  General Comments General comments (skin integrity, edema, etc.): HR 70s-80s during mobiltiy      Pertinent Vitals/Pain Pain Assessment: Faces Faces Pain Scale: Hurts little more Pain Location: R lateral ankle Pain Descriptors / Indicators:  Discomfort;Burning Pain Intervention(s): Limited activity within patient's tolerance;Monitored during session;Repositioned    Home Living                      Prior Function            PT Goals (current goals can now be found in the care plan section) Acute Rehab PT Goals Patient Stated Goal: to get a ground floor apartment PT Goal Formulation: With patient Time For Goal Achievement: 11/19/20 Potential to Achieve Goals: Fair Progress towards PT goals: Progressing toward goals    Frequency    Min 2X/week      PT Plan Current plan remains appropriate    Co-evaluation              AM-PAC PT "6 Clicks" Mobility   Outcome Measure  Help needed turning from your back to your side while in a flat bed without using bedrails?: A Little Help needed moving from lying on your back to sitting on the side of a flat bed without using bedrails?: A Little Help needed moving to and from a bed to a chair (including a wheelchair)?: A Little Help needed standing up from a chair using your arms (e.g., wheelchair or bedside chair)?: A Little Help needed to walk in hospital room?: A Little Help needed climbing 3-5 steps with a railing? : A Lot 6 Click Score: 17    End of Session Equipment Utilized During Treatment: Gait belt Activity Tolerance: Patient limited by fatigue Patient left: in bed;with call bell/phone within reach;with bed alarm set Nurse Communication: Mobility status PT Visit Diagnosis: Unsteadiness on feet (R26.81);Muscle weakness (generalized) (M62.81);Other abnormalities of gait and mobility (R26.89);Difficulty in walking, not elsewhere classified (R26.2)     Time: 5615-3794 PT Time Calculation (min) (ACUTE ONLY): 22 min  Charges:  $Gait Training: 8-22 mins                     Stacie Glaze, PT DPT Acute Rehabilitation Services Pager [phone]  Office [phone]    [place] 11/10/2020, 10:17 AM

## 2020-11-10 NOTE — Progress Notes (Signed)
PROGRESS NOTE    Jenna [name]  SLH:[id number] DOB: 1955/04/25 DOA: 11/01/2020 PCP: [name], Betty G, MD   Brief Narrative:  The patient is a 65 year old elderly chronically ill-appearing African-American female with a past medical history significant for but limited to ESRD status post DD KT in 2020 now with CKD stage IV, systolic CHF with an EF of 20 to 25%, history of CAD and STEMI status post DES to the LAD in 10/2018, PAF longer on anticoagulation, history of CMV viremia, AICD placement, hyperlipidemia, anxiety and depression as well as other comorbidities who presented with melena and abdominal discomfort along with decreased urinary output of.  CT scan on 917 was done with an age-indeterminate mildly associated pelvic [place], prominent right ureter with out evidence of stone and a thickened gallbladder wall.  Right upper quadrant ultrasound was done on 11/02/2018 with hyperechoic liver mass measuring about 1.3 cm as well as a thickened gallbladder without evidence of stone, sludge or Murphy sign.  MRI liver done on 11/03/2020 did not confirm the liver mass but did show diffuse wall thickening of the gallbladder and diffuse soft tissue anasarca with trace ascites.  Ultrasound of the transplant kidney on 11/04/2018 was unremarkable.  Creatinine remained elevated so nephrology was consulted and following and they were concerned that she may be losing her allograft but recommended no imminent need for hemodialysis.  Her torsemide has been resumed and she was also started on IV meropenem for Pseudomonas UTI and pyelonephritis of the transplanted kidney.  Renal function continues improve daily and is closer to her baseline is now 3.62.  Is been no recent tacrolimus level however nephrology has now decreased her tacrolimus dosing.  Palliative care has met with the patient and after goals of care discussion the patient wishes to continue treatment efforts.  PT OT recommending SNF and patient's daughter to choose  that offers this patient is agreeable to SNF.  Assessment & Plan:   Principal Problem:   Acute renal failure superimposed on stage 4 chronic kidney disease ([name]) Active Problems:   Lupus (systemic lupus erythematosus) ([name])   Hypertension with renal disease   Dyslipidemia   HFrEF (heart failure with reduced ejection fraction) ([name])   CAD (coronary artery disease)   Paroxysmal atrial fibrillation ([name])   Anxiety disorder   Pyelonephritis of transplanted kidney   Thrombocytopenia ([name])   Liver mass   Heme positive stool   AKI (acute kidney injury) ([place])   Right upper quadrant pain   Shortness of breath  AKI on CKD stage IV of allograft kidney, improving  -Creatinine was slowly rising and there was concern about losing allograft but there is no emergent need for hemodialysis yet; likely had AKI in the setting of volume overload from CHF -Patient's BUNs/creatinine has improved and now has trended down to 74/3.62 yesterday and today it is 75/3.40 -Nephrology is managing and restarted her torsemide yesterday and will continue -Tacrolimus level was elevated to 19 and note repeat tacrolimus level was visualized but now nephrology recommending decreasing her tacrolimus to 0.5 mg p.o. twice daily and continue to follow tacrolimus levels with trough tacrolimus level at 11/11/2020 the goal tacrolimus level to be 3-5 trough; trough to be obtained tomorrow -Her urine output continues to be good and creatinine continue to improve -PT OT recommending SNF and once bed is selected she can be likely discharged soon  Pseudomonas UTI/pyelonephritis with transplanted kidney -There is no convincing radiological clinical evidence but she is a transplanted kidney -She has been  on IV meropenem and will continue; she refused yesterday's dose but resumed today and this is day 6 and she will complete antibiotics tomorrow; unfortunately she lost her IV access so we will change her to p.o. one-time dose of renal  fluoroquinolone dosing -Continue with immunosuppression drugs with tacrolimus 0.5 mg p.o. twice daily, hydroxychloroquine 200 g p.o. daily and prednisone 5 mg p.o. daily with lunch  Positive Hemoccult and Melena -Hemoglobin/hematocrit has been stable and repeat now improved to 11.5/35.8 yesterday and today it is 11.0/32.1 -GI evaluated and recommending PPI twice daily and outpatient with them for possible EGD -Continue to monitor for signs and symptoms of bleeding; currently no overt bleeding noted now -Repeat CBC in a.m.  Acute on Chronic Systolic CHF with an EF of 20 to 25% -Follows with [facility] with shortness of breath, dyspnea exertion as well anasarca and a large right-sided pleural effusion -BNP was greater than 4500.0 x2 however upon prior review BNP has been greater than 4500 for the last 5 checks going back to this year -Torsemide has been resumed by nephrology -Will Fluid Restrict to 1200 mL -We will consider resuming BiDil at a lower dose of blood pressure stable with torsemide -Her Toprol-XL was held in setting of borderline bradycardia -We will need strict I's and O's and daily weights and continue monitor fluid status daily; she is +5.083 L since admission weight is up 6 pounds from 198.5 to 204.8 yesterday and is now trending back down and is 202.9 -Repeat chest x-ray done and showed "Small to moderate bilateral pleural effusions with bibasilar atelectasis or consolidation,  improved on the right and increased on the left."   Large right-sided pleural effusion, improving slightly -The setting of CHF and renal failure -Patient is not keen on ultrasound-guided thoracentesis for diagnostic and therapeutic purposes -Pleural effusion is chronic and torsemide has been restarted at 20 mg p.o. twice daily -Repeat chest x-ray in a.m as above. and will need an Ambulatory home O2 screen  CAD status post STEMI with DES to LAD in 11/04/2020 -She denies any current  chest pain -Troponin was elevated and went from 107 and trended up to 120 but she denied any chest pain and likely demand ischemia -Continue Rosuvastatin 5 mg po Daily  -Toprol-XL is currently on hold due to borderline bradycardia  Proximal Atrial Fibrillation -Currently was in sinus rhythm and had rate control -She is not on anticoagulation due to GI bleed -Continue telemetry monitoring -Resume Toprol XL likely in the morning if heart rate is stable  Pancytopenia -Relatively stable -In the setting of her immunosuppressed state -Patient's WBC is now 2.3, hemoglobin/hematocrit is 11.0/33.1 and platelet count is now 71 -Continue monitor and trend and repeat CBC in a.m.  Hypervolemic Hyponatremia -In the setting of CHF and renal failure -Diuresis as above and will continue torsemide -Sodium mildly improved to 134 -Continue monitor and trend and repeat CMP in the AM  Questionable liver mass -Noted on ultrasound but could not confirm on MRI although MRI was without con -We will need repeat imaging in about 3 months  History of CMV viremia -Continue with valganciclovir for 50 mg p.o. twice weekly once per day on Mondays and Thursdays  Generalized weakness and physical deconditioning -She uses a walker at baseline lives alone -PT OT evaluated and recommending skilled nursing facility and patient agreeable; she has been faxed out as to bed offers and social worker has been consulted for assistance with placement attempted to call the patient  daughter by the 2 but offered to have come back however patient's daughter did not pick up today when I was in the room the patient or when the social worker called  Anxiety and depression -Feel like she has a panic attack yesterday -Likely in the setting of her CHF and anasarca -Continue with diuresis as above  -Continue with low-dose lorazepam 0.5 mg every 12 as needed anxiety and continue with citalopram 10 mg p.o. daily -We will continue home  low-dose gabapentin  Goals of care -Has significant comorbidities and long-term prognosis is concerning -Patient's daughter and brother prefer her to be full code with full scope of care -Palliative care consulted and patient wishes to return remain full scope  Right ankle Tenderness -Resolved  Obesity -Complicates overall prognosis and care -Estimated body mass index is 31.78 kg/m as calculated from the following:   Height as of this encounter: _0  (1.702 m).   Weight as of this encounter: 92 kg. -Weight loss and Dietary Counseling given   DVT prophylaxis: SCDs given her melena and concern for GI bleed Code Status: FULL CODE Family Communication: Discussed with the daughter of [name] over the telephone when I was in the room Disposition Plan: Anticipating discharge to SNF in next 24 to 48 hours  Status is: Inpatient  Remains inpatient appropriate because:Unsafe d/c plan, IV treatments appropriate due to intensity of illness or inability to take PO, and Inpatient level of care appropriate due to severity of illness  Dispo: The patient is from: Home              Anticipated d/c is to: SNF              Patient currently is not medically stable to d/c.   Difficult to place patient No  Consultants:  Gastroenterology Nephrology Palliative care medicine Interventional radiology  Procedures: None  Antimicrobials:  Anti-infectives (From admission, onward)    Start     Dose/Rate Route Frequency Ordered Stop   11/10/20 2200  levofloxacin (LEVAQUIN) tablet 500 mg        500 mg Oral  Once 11/10/20 1400     11/06/20 0900  valGANciclovir (VALCYTE) 450 MG tablet TABS 450 mg        450 mg Oral Once per day on Mon Thu 11/04/20 1046     11/04/20 1015  meropenem (MERREM) 500 mg in sodium chloride 0.9 % 100 mL IVPB  Status:  Discontinued        500 mg 200 mL/hr over 30 Minutes Intravenous Every 12 hours 11/04/20 0925 11/10/20 1400   11/03/20 1400  ceFEPIme (MAXIPIME) 1 g in sodium  chloride 0.9 % 100 mL IVPB  Status:  Discontinued        1 g 200 mL/hr over 30 Minutes Intravenous Every 24 hours 11/03/20 1258 11/04/20 0925   11/03/20 1000  valGANciclovir (VALCYTE) 450 MG tablet TABS 450 mg  Status:  Discontinued        450 mg Oral Every other day 11/01/20 2320 11/04/20 1046   11/02/20 2200  cefTRIAXone (ROCEPHIN) 1 g in sodium chloride 0.9 % 100 mL IVPB  Status:  Discontinued        1 g 200 mL/hr over 30 Minutes Intravenous Daily at bedtime 11/01/20 2318 11/03/20 1258   11/02/20 1030  hydroxychloroquine (PLAQUENIL) tablet 200 mg        200 mg Oral Daily 11/01/20 2320     11/01/20 2200  cefTRIAXone (ROCEPHIN) 1 g in sodium chloride  0.9 % 100 mL IVPB        1 g 200 mL/hr over 30 Minutes Intravenous  Once 11/01/20 2145 11/01/20 2230        Subjective: Seen and examined at bedside and states that she is doing little bit better.  States that she was able to lay flatter now.  States that she ambulated with physical therapy today little short of breath.  She denies any lightheadedness or dizziness and happy that her antibiotics are stopping tomorrow.  Feels better and happy that her renal function is also improving back to her baseline.  No other concerns or complaints at this time and awaiting SNF placement soon once bed offer has been accepted and insurance authorization has been initiated.  Objective: Vitals:   11/09/20 2100 11/10/20 0248 11/10/20 0308 11/10/20 1241  BP: (!) 149/92  (!) 145/82 (!) 152/100  Pulse: 74  73 77  Resp: 20   18  Temp: 97.8 F (36.6 C)  (!) 97.5 F (36.4 C) 98.3 F (36.8 C)  TempSrc: Oral  Oral Oral  SpO2: 95%  98% 97%  Weight:  92 kg    Height:        Intake/Output Summary (Last 24 hours) at 11/10/2020 1548 Last data filed at 11/10/2020 0248 Gross per 24 hour  Intake 240 ml  Output 200 ml  Net 40 ml    Filed Weights   11/08/20 0231 11/09/20 0500 11/10/20 0248  Weight: 93.3 kg 92.9 kg 92 kg   Examination: Physical  Exam:  Constitutional: WN/WD chronically ill-appearing obese African-American female, in no acute distress and does not is withdrawn and not as anxious today Eyes: Lids and conjunctivae normal, sclerae anicteric  ENMT: External Ears, Nose appear normal. Grossly normal hearing. Mucous membranes are moist.  Neck: Appears normal, supple, no cervical masses, normal ROM, no appreciable thyromegaly; slight appreciable JVD Respiratory: Diminished to auscultation bilaterally with coarse breath sounds and some slight crackles worse on the right compared to the left, no wheezing, rales, rhonchi. Normal respiratory effort and patient is not tachypenic. No accessory muscle use.  She is unlabored breathing and not wearing any supplemental oxygen via nasal cannula Cardiovascular: RRR, no murmurs / rubs / gallops. S1 and S2 auscultated.  Mild 1+ lower from edema Abdomen: Soft, non-tender, distended secondary to body habitus. Bowel sounds positive.  GU: Deferred. Musculoskeletal: No clubbing / cyanosis of digits/nails. No joint deformity upper and lower extremities.  Skin: No rashes, lesions, ulcers on limited skin evaluation. No induration; Warm and dry.  Neurologic: CN 2-12 grossly intact with no focal deficits. Romberg sign and cerebellar reflexes not assessed.  Psychiatric: Normal judgment and insight. Alert and oriented x 3. Normal mood and appropriate affect.   Data Reviewed: I have personally reviewed following labs and imaging studies  CBC: Recent Labs  Lab 11/05/20 0507 11/06/20 0439 11/07/20 0233 11/09/20 0539 11/10/20 0223  WBC 3.6* 3.4* 2.8* 2.8* 2.3*  NEUTROABS  --   --   --  2.0 1.6*  HGB 10.5* 10.6* 10.5* 11.5* 11.0*  HCT 32.5* 33.8* 32.7* 35.8* 33.1*  MCV 91.3 92.6 91.9 91.6 89.9  PLT PLATELET CLUMPS NOTED ON SMEAR, UNABLE TO ESTIMATE 86* 77* 69* 71*    Basic Metabolic Panel: Recent Labs  Lab 11/06/20 0439 11/07/20 0233 11/08/20 0559 11/09/20 0539 11/10/20 0223  NA 132* 132*  133* 132* 134*  K 4.6 4.2 4.3 4.2 4.4  CL 102 103 105 102 103  CO2 18* 18* 17* 19* 18*  GLUCOSE 90 88 90 91 78  BUN 82* 76* 77* 74* 75*  CREATININE 4.55* 4.24* 3.79* 3.62* 3.40*  CALCIUM 9.4 9.0 9.0 9.3 9.3  MG 2.5* 2.6*  --  2.5* 2.4  PHOS 5.5* 4.9* 4.8* 4.5 4.6    GFR: Estimated Creatinine Clearance: 19.2 mL/min (A) (by C-G formula based on SCr of 3.4 mg/dL (H)). Liver Function Tests: Recent Labs  Lab 11/05/20 0507 11/06/20 0439 11/07/20 0233 11/08/20 0559 11/09/20 0539 11/10/20 0223  AST 23 29  --   --   --  24  ALT 23 23  --   --   --  20  ALKPHOS 104 105  --   --   --  93  BILITOT 0.7 0.9  --   --   --  1.5*  PROT 5.9* 6.1*  --   --   --  6.3*  ALBUMIN 2.7* 2.8*  2.9* 2.7* 2.6* 3.0* 2.9*    No results for input(s): LIPASE, AMYLASE in the last 168 hours. No results for input(s): AMMONIA in the last 168 hours. Coagulation Profile: No results for input(s): INR, PROTIME in the last 168 hours. Cardiac Enzymes: No results for input(s): CKTOTAL, CKMB, CKMBINDEX, TROPONINI in the last 168 hours. BNP (last 3 results) No results for input(s): PROBNP in the last 8760 hours. HbA1C: No results for input(s): HGBA1C in the last 72 hours. CBG: No results for input(s): GLUCAP in the last 168 hours. Lipid Profile: No results for input(s): CHOL, HDL, LDLCALC, TRIG, CHOLHDL, LDLDIRECT in the last 72 hours. Thyroid Function Tests: No results for input(s): TSH, T4TOTAL, FREET4, T3FREE, THYROIDAB in the last 72 hours. Anemia Panel: No results for input(s): VITAMINB12, FOLATE, FERRITIN, TIBC, IRON, RETICCTPCT in the last 72 hours. Sepsis Labs: No results for input(s): PROCALCITON, LATICACIDVEN in the last 168 hours.  Recent Results (from the past 240 hour(s))  Urine Culture     Status: Abnormal   Collection Time: 11/01/20  4:18 PM   Specimen: In/Out Cath Urine  Result Value Ref Range Status   Specimen Description IN/OUT CATH URINE  Final   Special Requests   Final     Immunocompromised Performed at [facility], [address], [place], [place] [address]    Culture >=100,000 COLONIES/mL PSEUDOMONAS AERUGINOSA (A)  Final   Report Status 11/04/2020 FINAL  Final   Organism ID, Bacteria PSEUDOMONAS AERUGINOSA (A)  Final      Susceptibility   Pseudomonas aeruginosa - MIC*    CEFTAZIDIME 16 INTERMEDIATE Intermediate     CIPROFLOXACIN <=0.25 SENSITIVE Sensitive     GENTAMICIN 2 SENSITIVE Sensitive     IMIPENEM 1 SENSITIVE Sensitive     CEFEPIME RESISTANT Resistant     * >=100,000 COLONIES/mL PSEUDOMONAS AERUGINOSA  SARS CORONAVIRUS 2 (TAT 6-24 HRS) Nasopharyngeal Nasopharyngeal Swab     Status: None   Collection Time: 11/01/20 11:14 PM   Specimen: Nasopharyngeal Swab  Result Value Ref Range Status   SARS Coronavirus 2 NEGATIVE NEGATIVE Final    Comment: (NOTE) SARS-CoV-2 target nucleic acids are NOT DETECTED.  The SARS-CoV-2 RNA is generally detectable in upper and lower respiratory specimens during the acute phase of infection. Negative results do not preclude SARS-CoV-2 infection, do not rule out co-infections with other pathogens, and should not be used as the sole basis for treatment or other patient management decisions. Negative results must be combined with clinical observations, patient history, and epidemiological information. The expected result is Negative.  Fact Sheet for Patients:  SugarRoll.be  Fact Sheet for Healthcare Providers: https://www.woods-mathews.com/  This test is not yet approved or cleared by the [place] FDA and  has been authorized for detection and/or diagnosis of SARS-CoV-2 by FDA under an Emergency Use Authorization (EUA). This EUA will remain  in effect (meaning this test can be used) for the duration of the COVID-19 declaration under Se ction 564(b)(1) of the Act, 21 U.S.C. section 360bbb-3(b)(1), unless the authorization is terminated or revoked  sooner.  Performed at [facility], [place] [address] [place], [place] [address]     RN Pressure Injury Documentation:     Estimated body mass index is 31.78 kg/m as calculated from the following:   Height as of this encounter: _0  (1.702 m).   Weight as of this encounter: 92 kg.  Malnutrition Type:   Malnutrition Characteristics:   Nutrition Interventions:     Radiology Studies: DG CHEST PORT 1 VIEW  Result Date: 11/10/2020 CLINICAL DATA:  Shortness of breath.  History of renal failure. EXAM: PORTABLE CHEST 1 VIEW COMPARISON:  11/06/2020 FINDINGS: The cardiac silhouette remains enlarged. There are small to moderate bilateral pleural effusions, smaller on the right compared to the prior study but new on the left. There are bibasilar airspace opacities which have increased on the left and improved on the right. No pneumothorax is identified. IMPRESSION: Small to moderate bilateral pleural effusions with bibasilar atelectasis or consolidation, improved on the right and increased on the left. Electronically Signed   By: Logan Bores M.D.   On: 11/10/2020 06:56    Scheduled Meds:  citalopram  10 mg Oral Daily   gabapentin  100 mg Oral QHS   hydroxychloroquine  200 mg Oral Daily   influenza vaccine adjuvanted  0.5 mL Intramuscular Tomorrow-1000   levofloxacin  500 mg Oral Once   melatonin  5 mg Oral QHS   pantoprazole (PROTONIX) IV  40 mg Intravenous Q12H   predniSONE  5 mg Oral Q lunch   rosuvastatin  5 mg Oral q1800   sodium chloride flush  3 mL Intravenous Q12H   tacrolimus  0.5 mg Oral Daily   tacrolimus  0.5 mg Oral QHS   torsemide  20 mg Oral BID   valGANciclovir  450 mg Oral Once per day on Mon Thu   Continuous Infusions:  sodium chloride Stopped (11/06/20 0007)    LOS: 8 days   Kerney Elbe, DO [facility] PAGER is on AMION  If 7PM-7AM, please contact night-coverage [email or web address]

## 2020-11-10 NOTE — Plan of Care (Signed)
Patient progressing 

## 2020-11-10 NOTE — Progress Notes (Signed)
VAST consult received to obtain IV access. Pt with restricted left arm d/t AVF. Pt with infiltrated PIV in upper right arm. Assessed right arm utilizing ultrasound; attempted IV placement X1; IV in vessel next to nerve and had to be removed. No other appropriate vessels for IV access at this time, other than vessels for midline or PICC.  SecureChat sent to Justin Mend, MD with nephrology. No response. Added attending Alfredia Ferguson, MD to [employer]. He responded, "She needs only a few more doses. IV abx are to be stopped tomorrow". VAST RN: "She has no veins left to place a PIV. Reason we are asking for midline approval. Or if you could consult with pharmacy to give IM or PO antibiotics?!?!?! THanks" Dr. Alfredia Ferguson consulted Jimmy Footman, [facility] who advised "IT looks like her pseudomonas was quinolone sensitive. We could give her a renal adjusted dose of levaquin po tonight and that will carry her through tomorrow as well". Dr. Alfredia Ferguson agreed with PO treatment.

## 2020-11-11 ENCOUNTER — Inpatient Hospital Stay: Payer: Medicare Other | Admitting: Family Medicine

## 2020-11-11 DIAGNOSIS — N179 Acute kidney failure, unspecified: Secondary | ICD-10-CM | POA: Diagnosis not present

## 2020-11-11 DIAGNOSIS — N12 Tubulo-interstitial nephritis, not specified as acute or chronic: Secondary | ICD-10-CM | POA: Diagnosis not present

## 2020-11-11 DIAGNOSIS — F419 Anxiety disorder, unspecified: Secondary | ICD-10-CM | POA: Diagnosis not present

## 2020-11-11 DIAGNOSIS — I2510 Atherosclerotic heart disease of native coronary artery without angina pectoris: Secondary | ICD-10-CM | POA: Diagnosis not present

## 2020-11-11 LAB — BASIC METABOLIC PANEL
Anion gap: 14 (ref 5–15)
BUN: 79 mg/dL — ABNORMAL HIGH (ref 8–23)
CO2: 15 mmol/L — ABNORMAL LOW (ref 22–32)
Calcium: 9.7 mg/dL (ref 8.9–10.3)
Chloride: 104 mmol/L (ref 98–111)
Creatinine, Ser: 3.47 mg/dL — ABNORMAL HIGH (ref 0.44–1.00)
GFR, Estimated: 14 mL/min — ABNORMAL LOW (ref >60)
Glucose, Bld: 75 mg/dL (ref 70–99)
Potassium: 4.7 mmol/L (ref 3.5–5.1)
Sodium: 133 mmol/L — ABNORMAL LOW (ref 135–145)

## 2020-11-11 LAB — COMPREHENSIVE METABOLIC PANEL
ALT: 19 U/L (ref 0–44)
AST: 46 U/L — ABNORMAL HIGH (ref 15–41)
Albumin: 2.8 g/dL — ABNORMAL LOW (ref 3.5–5.0)
Alkaline Phosphatase: 88 U/L (ref 38–126)
Anion gap: 12 (ref 5–15)
BUN: 80 mg/dL — ABNORMAL HIGH (ref 8–23)
CO2: 17 mmol/L — ABNORMAL LOW (ref 22–32)
Calcium: 9.4 mg/dL (ref 8.9–10.3)
Chloride: 104 mmol/L (ref 98–111)
Creatinine, Ser: 3.53 mg/dL — ABNORMAL HIGH (ref 0.44–1.00)
GFR, Estimated: 14 mL/min — ABNORMAL LOW (ref >60)
Glucose, Bld: 82 mg/dL (ref 70–99)
Potassium: 6.1 mmol/L — ABNORMAL HIGH (ref 3.5–5.1)
Sodium: 133 mmol/L — ABNORMAL LOW (ref 135–145)
Total Bilirubin: 1.1 mg/dL (ref 0.3–1.2)
Total Protein: 6 g/dL — ABNORMAL LOW (ref 6.5–8.1)

## 2020-11-11 LAB — CBC WITH DIFFERENTIAL/PLATELET
Abs Immature Granulocytes: 0.26 10*3/uL — ABNORMAL HIGH (ref 0.00–0.07)
Basophils Absolute: 0 10*3/uL (ref 0.0–0.1)
Basophils Relative: 1 %
Eosinophils Absolute: 0 10*3/uL (ref 0.0–0.5)
Eosinophils Relative: 1 %
HCT: 33.1 % — ABNORMAL LOW (ref 36.0–46.0)
Hemoglobin: 10.8 g/dL — ABNORMAL LOW (ref 12.0–15.0)
Immature Granulocytes: 12 %
Lymphocytes Relative: 18 %
Lymphs Abs: 0.4 10*3/uL — ABNORMAL LOW (ref 0.7–4.0)
MCH: 29.5 pg (ref 26.0–34.0)
MCHC: 32.6 g/dL (ref 30.0–36.0)
MCV: 90.4 fL (ref 80.0–100.0)
Monocytes Absolute: 0.2 10*3/uL (ref 0.1–1.0)
Monocytes Relative: 11 %
Neutro Abs: 1.3 10*3/uL — ABNORMAL LOW (ref 1.7–7.7)
Neutrophils Relative %: 57 %
Platelets: 65 10*3/uL — ABNORMAL LOW (ref 150–400)
RBC: 3.66 MIL/uL — ABNORMAL LOW (ref 3.87–5.11)
RDW: 21.9 % — ABNORMAL HIGH (ref 11.5–15.5)
WBC: 2.2 10*3/uL — ABNORMAL LOW (ref 4.0–10.5)
nRBC: 0 % (ref 0.0–0.2)

## 2020-11-11 LAB — SARS CORONAVIRUS 2 (TAT 6-24 HRS): SARS Coronavirus 2: NEGATIVE

## 2020-11-11 LAB — MAGNESIUM: Magnesium: 2.5 mg/dL — ABNORMAL HIGH (ref 1.7–2.4)

## 2020-11-11 LAB — PHOSPHORUS: Phosphorus: 4.8 mg/dL — ABNORMAL HIGH (ref 2.5–4.6)

## 2020-11-11 LAB — TACROLIMUS LEVEL: Tacrolimus (FK506) - LabCorp: 14.5 ng/mL (ref 2.0–20.0)

## 2020-11-11 MED ORDER — ONDANSETRON HCL 4 MG PO TABS: 4.0000 mg | ORAL_TABLET | Freq: Four times a day (QID) | ORAL | 0 refills | Status: DC | PRN

## 2020-11-11 MED ORDER — TACROLIMUS 0.5 MG PO CAPS: 0.5000 mg | ORAL_CAPSULE | Freq: Every day | ORAL | 0 refills | Status: DC

## 2020-11-11 MED ORDER — GABAPENTIN 100 MG PO CAPS: 100.0000 mg | ORAL_CAPSULE | Freq: Every day | ORAL | Status: DC

## 2020-11-11 MED ORDER — SODIUM ZIRCONIUM CYCLOSILICATE 10 G PO PACK
10.0000 g | PACK | Freq: Once | ORAL | Status: AC
  Administered 2020-11-11: 10 g via ORAL
  Filled 2020-11-11: qty 1

## 2020-11-11 MED ORDER — PANTOPRAZOLE SODIUM 40 MG PO TBEC: 40.0000 mg | DELAYED_RELEASE_TABLET | Freq: Two times a day (BID) | ORAL | 0 refills | Status: DC

## 2020-11-11 MED ORDER — SODIUM BICARBONATE 650 MG PO TABS
650.0000 mg | ORAL_TABLET | Freq: Three times a day (TID) | ORAL | Status: DC
  Administered 2020-11-11 – 2020-11-12 (×3): 650 mg via ORAL
  Filled 2020-11-11 (×3): qty 1

## 2020-11-11 MED ORDER — PANTOPRAZOLE SODIUM 40 MG PO TBEC
40.0000 mg | DELAYED_RELEASE_TABLET | Freq: Two times a day (BID) | ORAL | Status: DC
  Administered 2020-11-11 (×2): 40 mg via ORAL
  Filled 2020-11-11 (×3): qty 1

## 2020-11-11 MED ORDER — SODIUM BICARBONATE 650 MG PO TABS: 650.0000 mg | ORAL_TABLET | Freq: Three times a day (TID) | ORAL | 0 refills | Status: DC

## 2020-11-11 MED ORDER — ALPRAZOLAM 0.25 MG PO TABS
0.2500 mg | ORAL_TABLET | Freq: Every evening | ORAL | 0 refills | Status: DC | PRN
Start: 2020-11-11 — End: 2020-12-19

## 2020-11-11 NOTE — NC FL2 (Signed)
[place] LEVEL OF CARE SCREENING TOOL     IDENTIFICATION  Patient Name: Jenna [name] Birthdate: 11-Nov-1955 Sex: female Admission Date (Current Location): 11/01/2020  [facility] and [place] Number:  Herbalist and Address:  [facility], [place] [address], [place], [place] [address]      Provider Number: [id number]  Attending Physician Name and Address:  Kerney Elbe, DO  Relative Name and Phone Number:  Geralynn, Capri (Daughter)   [phone]    Current Level of Care: Hospital Recommended Level of Care: [place] Prior Approval Number:    Date Approved/Denied:   PASRR Number:    Discharge Plan: SNF    Current Diagnoses: Patient Active Problem List   Diagnosis Date Noted   Shortness of breath    AKI (acute kidney injury) ([place]) 11/02/2020   Right upper quadrant pain    Acute renal failure superimposed on stage 4 chronic kidney disease ([place]) 11/01/2020   Pyelonephritis of transplanted kidney 11/01/2020   Thrombocytopenia ([place]) 11/01/2020   Liver mass 11/01/2020   Heme positive stool 11/01/2020   Insomnia 10/29/2020   Peripheral neuropathy 10/13/2020   Acute on chronic systolic CHF (congestive heart failure) ([place]) 07/11/2020   Acute respiratory failure with hypoxia ([place]) 07/11/2020   Cytomegalovirus (CMV) viremia ([place]) 07/11/2020   Elevated troponin 07/11/2020   Anemia of chronic disease 07/11/2020   Acute kidney injury superimposed on chronic kidney disease ([place]) 07/11/2020   Allergic rhinitis 01/01/2020   Anxiety disorder 06/26/2019   HFrEF (heart failure with reduced ejection fraction) ([place]) 2019-03-19   CAD (coronary artery disease) 03-19-2019   Paroxysmal atrial fibrillation ([place]) March 19, 2019   Deceased-donor kidney transplant 03/27/2018   Dyslipidemia 11/06/2017   CKD (chronic kidney disease) stage 5, GFR less than 15 ml/min ([name]) 02/15/2017   Back pain at L4-L5 level 10/31/2014   Muscle  spasm of back 10/31/2014   Chronic maxillary sinusitis 07/04/2014   Occipital headache 07/04/2014   Increased urinary frequency 05/23/2014   Falls 05/23/2014   Rash and nonspecific skin eruption 05/23/2014   Hypertension with renal disease 04/16/2014   Encounter for immunization 11/19/2013   Lupus (systemic lupus erythematosus) ([place]) 09/24/2013   TIA (transient ischemic attack) 05/05/2013   Numbness and tingling of left side of face 05/04/2013   Lupus ([place]) 04/15/2011   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    Orientation RESPIRATION BLADDER Height & Weight     Self, Time, Situation, Place  Normal Continent Weight: 204 lb 11.2 oz (92.9 kg) Height:  $Remove'5\' 7"'[id number]$  (170.2 cm)  BEHAVIORAL SYMPTOMS/MOOD NEUROLOGICAL BOWEL NUTRITION STATUS      Continent Diet (See DC Summary)  AMBULATORY STATUS COMMUNICATION OF NEEDS Skin   Limited Assist Verbally Normal                       Personal Care Assistance Level of Assistance  Bathing, Feeding, Dressing Bathing Assistance: Limited assistance Feeding assistance: Independent Dressing Assistance: Limited assistance     Functional Limitations Info  Sight, Hearing, Speech Sight Info: Impaired Hearing Info: Adequate Speech Info: Adequate    SPECIAL CARE FACTORS FREQUENCY  PT (By licensed PT), OT (By licensed OT)     PT Frequency: 5x a week OT Frequency: 5x a week            Contractures Contractures Info: Not present    Additional Factors Info  Code Status, Allergies Code Status Info:  Full Allergies Info: Enalapril Maleate   Penicillins   Chocolate   Tape           Current Medications (11/11/2020):  This is the current hospital active medication list Current Facility-Administered Medications  Medication Dose Route Frequency Provider Last Rate Last Admin   0.9 %  sodium chloride infusion   Intravenous PRN Mercy Riding, MD   Stopped at 11/06/20 0007   acetaminophen (TYLENOL)  tablet 650 mg  650 mg Oral Q6H PRN Lenore Cordia, MD   650 mg at 11/10/20 2122   Or   acetaminophen (TYLENOL) suppository 650 mg  650 mg Rectal Q6H PRN Lenore Cordia, MD       citalopram (CELEXA) tablet 10 mg  10 mg Oral Daily Zada Finders R, MD   10 mg at 11/11/20 0851   gabapentin (NEURONTIN) capsule 100 mg  100 mg Oral QHS Zada Finders R, MD   100 mg at 11/10/20 2122   hydroxychloroquine (PLAQUENIL) tablet 200 mg  200 mg Oral Daily Zada Finders R, MD   200 mg at 11/11/20 1275   influenza vaccine adjuvanted (FLUAD) injection 0.5 mL  0.5 mL Intramuscular Tomorrow-1000 Oswald [place], MD       LORazepam (ATIVAN) tablet 0.5 mg  0.5 mg Oral Q12H PRN Wendee Beavers T, MD   0.5 mg at 11/11/20 0402   melatonin tablet 5 mg  5 mg Oral QHS Wendee Beavers T, MD   5 mg at 11/10/20 2122   ondansetron (ZOFRAN) tablet 4 mg  4 mg Oral Q6H PRN Lenore Cordia, MD       Or   ondansetron (ZOFRAN) injection 4 mg  4 mg Intravenous Q6H PRN Lenore Cordia, MD       pantoprazole (PROTONIX) EC tablet 40 mg  40 mg Oral BID AC Sheikh, Omair [place], DO   40 mg at 11/11/20 1700   predniSONE (DELTASONE) tablet 5 mg  5 mg Oral Q lunch Zada Finders R, MD   5 mg at 11/11/20 1157   rosuvastatin (CRESTOR) tablet 5 mg  5 mg Oral q1800 Zada Finders R, MD   5 mg at 11/10/20 1800   sodium chloride flush (NS) 0.9 % injection 3 mL  3 mL Intravenous Q12H Zada Finders R, MD   3 mL at 11/10/20 1015   tacrolimus (PROGRAF) capsule 0.5 mg  0.5 mg Oral Daily Zada Finders R, MD   0.5 mg at 11/11/20 1749   tacrolimus (PROGRAF) capsule 0.5 mg  0.5 mg Oral QHS Edrick Oh, MD   0.5 mg at 11/10/20 2121   torsemide (DEMADEX) tablet 20 mg  20 mg Oral BID Edrick Oh, MD   20 mg at 11/11/20 0851   valGANciclovir (VALCYTE) 450 MG tablet TABS 450 mg  450 mg Oral Once per day on Mon Thu Webb, Martin, MD   450 mg at 11/10/20 1007     Discharge Medications: Please see discharge summary for a list of discharge medications.  Relevant Imaging  Results:  Relevant Lab Results:   Additional Information SSN: [id number]; PFIZER Comrnaty([place] TOP) Covid-19 Vaccine 02/06/2020 , 05/30/2019 , 05/12/2019  Reece Agar, LCSWA

## 2020-11-11 NOTE — Discharge Summary (Addendum)
Physician Discharge Summary  Jenna [name] XBJ:[id number] DOB: 07/19/1955 DOA: 11/01/2020  PCP: [name], Betty G, MD  Admit date: 11/01/2020 Discharge date: 11/11/2020  Admitted From: Home Disposition: SNF  Recommendations for Outpatient Follow-up:  Follow up with PCP in 1-2 weeks Follow up with Gastroenterology Dr. Lorenso Courier on 12/03/20 at 11:10 AM Follow up with Nephrology within 1-2 weeks Follow up with Cardiology within 1-2 weeks  Please obtain CMP/CBC, Mag, Phos in one week Please follow up on the following pending results:  Home Health: No Equipment/Devices: None    Discharge Condition: Stable  CODE STATUS: FULL CODE  Diet recommendation: Heart Healthy Renal Diet with 1500 mL Fluid Restriction  Brief/Interim Summary: The patient is a 65 year old elderly chronically ill-appearing African-American female with a past medical history significant for but limited to ESRD status post DD KT in 2020 now with CKD stage IV, systolic CHF with an EF of 20 to 25%, history of CAD and STEMI status post DES to the LAD in 10/2018, PAF longer on anticoagulation, history of CMV viremia, AICD placement, hyperlipidemia, anxiety and depression as well as other comorbidities who presented with melena and abdominal discomfort along with decreased urinary output of.  CT scan on 917 was done with an age-indeterminate mildly associated pelvic [place], prominent right ureter with out evidence of stone and a thickened gallbladder wall.  Right upper quadrant ultrasound was done on 11/02/2018 with hyperechoic liver mass measuring about 1.3 cm as well as a thickened gallbladder without evidence of stone, sludge or Murphy sign.  MRI liver done on 11/03/2020 did not confirm the liver mass but did show diffuse wall thickening of the gallbladder and diffuse soft tissue anasarca with trace ascites.  Ultrasound of the transplant kidney on 11/04/2018 was unremarkable.  Creatinine remained elevated so nephrology was consulted and  following and they were concerned that she may be losing her allograft but recommended no imminent need for hemodialysis.  Her torsemide has been resumed and she was also started on IV meropenem for Pseudomonas UTI and pyelonephritis of the transplanted kidney.  Renal function continues improve daily and is closer to her baseline is now 3.62.  Is been no recent tacrolimus level however nephrology has now decreased her tacrolimus dosing.  Palliative care has met with the patient and after goals of care discussion the patient wishes to continue treatment efforts.  PT OT recommending SNF she is improved with Renal Fxn stable.    Discharge Diagnoses:  Principal Problem:   Acute renal failure superimposed on stage 4 chronic kidney disease ([name]) Active Problems:   Lupus (systemic lupus erythematosus) ([name])   Hypertension with renal disease   Dyslipidemia   HFrEF (heart failure with reduced ejection fraction) ([name])   CAD (coronary artery disease)   Paroxysmal atrial fibrillation ([name])   Anxiety disorder   Pyelonephritis of transplanted kidney   Thrombocytopenia ([name])   Liver mass   Heme positive stool   AKI (acute kidney injury) ([place])   Right upper quadrant pain   Shortness of breath  AKI on CKD stage IV of allograft kidney, improving  Metabolic Acidosis -Creatinine was slowly rising and there was concern about losing allograft but there is no emergent need for hemodialysis yet; likely had AKI in the setting of volume overload from CHF -Patient's BUNs/creatinine has gone from from 75/3.40 -> 80/3.53 -> 79/3.47 -Patient has a Metabolic Acidosis with a  CO2 of 15, AG of 14, and Chloride Level of 104; Will start Sodium Bicarbonate 650 mg po TID  and will repeat CMP in 1 week  -Nephrology is managing and restarted her torsemide yesterday and will continue at her home dose  -Tacrolimus level was elevated to 19 and note repeat tacrolimus level was visualized but now nephrology recommending decreasing  her tacrolimus to 0.5 mg p.o. twice daily and continue to follow tacrolimus levels with trough tacrolimus level at 11/11/2020 the goal tacrolimus level to be 3-5 trough; trough to be obtained tomorrow -Her urine output continues to be good and creatinine continue to improve -PT OT recommending SNF and once bed is selected she can be likely discharged soon -Follow up with Nephrology as an outpatient    Pseudomonas UTI/pyelonephritis with transplanted kidney, improving  -There is no convincing radiological clinical evidence but she is a transplanted kidney -She has been on IV meropenem and will continue; she refused yesterday's dose but resumed today and this is day 6 and she will complete antibiotics tomorrow; unfortunately she lost her IV access so we will change her to p.o. one-time dose of renal fluoroquinolone dosing -Continue with immunosuppression drugs with tacrolimus 0.5 mg p.o. twice daily, hydroxychloroquine 200 g p.o. daily and prednisone 5 mg p.o. daily with lunch -Follow up with Nephrology as an outpatient    Positive Hemoccult and Melena -Hemoglobin/hematocrit has been stable and is now 10.8/33.1 -GI evaluated and recommending PPI twice daily and outpatient with them for possible EGD -Continue to monitor for signs and symptoms of bleeding; currently no overt bleeding noted now -Repeat CBC within 1 week and has an appointment with Dr. Lorenso Courier on 12/03/20 at 11:10 am   Acute on Chronic Systolic CHF with an EF of 20 to 25% -Follows with [facility] with shortness of breath, dyspnea exertion as well anasarca and a large right-sided pleural effusion -BNP was greater than 4500.0 x2 however upon prior review BNP has been greater than 4500 for the last 5 checks going back to this year -Torsemide has been resumed by nephrology -Will Fluid Restrict to 1200 mL -Resume Bidil -Her Toprol-XL was held in setting of borderline bradycardia but will now resume  -We will need  strict I's and O's and daily weights and continue monitor fluid status daily; she is +5.703 L since admission weight is up 6 pounds from 198.5 to 204.8 yesterday and is now trending back down and is 202.9 -Repeat chest x-ray done and showed "Small to moderate bilateral pleural effusions with bibasilar atelectasis or consolidation,  improved on the right and increased on the left." -Follow up with Cardiology within 1-2 weeks   Large right-sided pleural effusion, improving slightly -The setting of CHF and renal failure -Patient is not keen on ultrasound-guided thoracentesis for diagnostic and therapeutic purposes -Pleural effusion is chronic and Torsemide has been restarted at 20 mg p.o. twice daily -Repeat chest x-ray in a.m as above showed "Small to moderate bilateral pleural effusions with bibasilar atelectasis or consolidation, improved on the right and increased on the left". -Repeat CXR in the AM    CAD status post STEMI with DES to LAD in 11/04/2020 -She denies any current chest pain -Troponin was elevated and went from 107 and trended up to 120 but she denied any chest pain and likely demand ischemia -Continue Rosuvastatin 5 mg po Daily  -Toprol-XL was currently on hold due to borderline bradycardia but will resume now  -Per Discussion with GI ok to resume ASA 81 mg    Proximal Atrial Fibrillation -Currently was in sinus rhythm and had rate control -She is  not on anticoagulation due to GI bleed; on ASA 8 -Continue telemetry monitoring -Resume Toprol Xl at D/C    Pancytopenia -Relatively stable -In the setting of her immunosuppressed state -Patient's WBC is now 2.2, hemoglobin/hematocrit is 10.8/33.1 and platelet count is now 65 -Continue monitor and trend and repeat CBC within 1 week   Hypervolemic Hyponatremia -In the setting of CHF and renal failure -Diuresis as above and will continue torsemide -Sodium is 133 -Continue monitor and trend and repeat CMP within 1 week     Questionable liver mass -Noted on ultrasound but could not confirm on MRI although MRI was without con -We will need repeat imaging in about 3 months   History of CMV viremia -Continue with Valganciclovir for 50 mg p.o. twice weekly once per day on Mondays and Thursdays   Generalized weakness and physical deconditioning -She uses a walker at baseline lives alone -PT OT evaluated and recommending skilled nursing facility and patient agreeable   Anxiety and Depression -Feel like she has a panic attack yesterday -Likely in the setting of her CHF and anasarca -Continue with diuresis as above  -Continue with low-dose lorazepam 0.5 mg every 12 as needed anxiety and continue with citalopram 10 mg p.o. daily -We will continue home low-dose gabapentin at 100 mg    Goals of care -Has significant comorbidities and long-term prognosis is concerning -Patient's daughter and brother prefer her to be full code with full scope of care -Palliative care consulted and patient wishes to return remain full scope   Right ankle Tenderness -Resolved   Obesity -Complicates overall prognosis and care -Estimated body mass index is 32.06 kg/m as calculated from the following:   Height as of this encounter: 5' 7" (1.702 m).   Weight as of this encounter: 92.9 kg. -Weight loss and Dietary Counseling given   Discharge Instructions  Discharge Instructions     Call MD for:  difficulty breathing, headache or visual disturbances   Complete by: As directed    Call MD for:  extreme fatigue   Complete by: As directed    Call MD for:  hives   Complete by: As directed    Call MD for:  persistant dizziness or light-headedness   Complete by: As directed    Call MD for:  persistant nausea and vomiting   Complete by: As directed    Call MD for:  redness, tenderness, or signs of infection (pain, swelling, redness, odor or green/yellow discharge around incision site)   Complete by: As directed    Call MD for:   severe uncontrolled pain   Complete by: As directed    Call MD for:  temperature >100.4   Complete by: As directed    Diet - low sodium heart healthy   Complete by: As directed    1500 mL Fluid Restriction   Discharge instructions   Complete by: As directed    You were cared for by a hospitalist during your hospital stay. If you have any questions about your discharge medications or the care you received while you were in the hospital after you are discharged, you can call the unit and ask to speak with the hospitalist on call if the hospitalist that took care of you is not available. Once you are discharged, your primary care physician will handle any further medical issues. Please note that NO REFILLS for any discharge medications will be authorized once you are discharged, as it is imperative that you return to your primary care  physician (or establish a relationship with a primary care physician if you do not have one) for your aftercare needs so that they can reassess your need for medications and monitor your lab values.  Follow up with PCP, Nephrology, Gastroenterology, and Cardiology in the outpatient setting. Take all medications as prescribed. If symptoms change or worsen please return to the ED for evaluation   Increase activity slowly   Complete by: As directed       Allergies as of 11/11/2020       Reactions   Enalapril Maleate Anaphylaxis, Swelling, Other (See Comments)   Throat swells   Penicillins Other (See Comments)   Made patient lightheaded PATIENT HAS HAD A PCN REACTION WITH IMMEDIATE RASH, FACIAL/TONGUE/THROAT SWELLING, SOB, OR LIGHTHEADEDNESS WITH HYPOTENSION:  #  #  YES  #  #  Has patient had a PCN reaction causing severe rash involving mucus membranes or skin necrosis: No Has patient had a PCN reaction that required hospitalization: No Has patient had a PCN reaction occurring within the last 10 years: No If all of the above answers are "NO", then may proceed with  Cephalosporin use.   Chocolate Nausea And Vomiting   Tape Itching, Rash, Other (See Comments)        Medication List     TAKE these medications    acetaminophen 500 MG tablet Commonly known as: TYLENOL Take 1,000 mg by mouth every 6 (six) hours as needed for headache.   ALPRAZolam 0.25 MG tablet Commonly known as: XANAX Take 1 tablet (0.25 mg total) by mouth at bedtime as needed for anxiety.   aspirin 81 MG EC tablet Take 81 mg by mouth every morning.   citalopram 10 MG tablet Commonly known as: CeleXA Take 1 tablet (10 mg total) by mouth daily.   gabapentin 100 MG capsule Commonly known as: NEURONTIN Take 1 capsule (100 mg total) by mouth at bedtime.   hydroxychloroquine 200 MG tablet Commonly known as: PLAQUENIL Take 1 tablet (200 mg total) by mouth 2 (two) times daily. What changed: when to take this   isosorbide-hydrALAZINE 20-37.5 MG tablet Commonly known as: BIDIL Take 1 tablet by mouth 3 (three) times daily.   metoprolol succinate 25 MG 24 hr tablet Commonly known as: TOPROL-XL Take 1 tablet (25 mg total) by mouth daily.   nitroGLYCERIN 0.4 MG SL tablet Commonly known as: NITROSTAT Place 0.4 mg under the tongue every 5 (five) minutes as needed for chest pain.   ondansetron 4 MG tablet Commonly known as: ZOFRAN Take 1 tablet (4 mg total) by mouth every 6 (six) hours as needed for nausea.   pantoprazole 40 MG tablet Commonly known as: PROTONIX Take 1 tablet (40 mg total) by mouth 2 (two) times daily before a meal.   predniSONE 5 MG tablet Commonly known as: DELTASONE Take 5 mg by mouth daily with lunch.   rosuvastatin 5 MG tablet Commonly known as: CRESTOR Take 5 mg by mouth daily at 6 PM.   simethicone 80 MG chewable tablet Commonly known as: MYLICON Chew 80 mg by mouth 4 (four) times daily as needed for flatulence.   sodium bicarbonate 650 MG tablet Take 1 tablet (650 mg total) by mouth 3 (three) times daily.   tacrolimus 0.5 MG  capsule Commonly known as: PROGRAF Take 1 capsule (0.5 mg total) by mouth at bedtime. What changed: You were already taking a medication with the same name, and this prescription was added. Make sure you understand how and when to  take each.   tacrolimus 0.5 MG capsule Commonly known as: PROGRAF Take 1 capsule (0.5 mg total) by mouth daily. Start taking on: November 12, 2020 What changed:  how much to take when to take this additional instructions   torsemide 20 MG tablet Commonly known as: DEMADEX Take 20 mg by mouth 2 (two) times daily.   valGANciclovir 450 MG tablet Commonly known as: VALCYTE Take 450 mg by mouth every other day.        Follow-up Information     [name], Betty G, MD Follow up on 11/11/2020.   Specialty: Family Medicine Why: 9:30 for hospital follow up Contact information: [place] [address] [phone]                Allergies  Allergen Reactions   Enalapril Maleate Anaphylaxis, Swelling and Other (See Comments)    Throat swells   Penicillins Other (See Comments)    Made patient lightheaded PATIENT HAS HAD A PCN REACTION WITH IMMEDIATE RASH, FACIAL/TONGUE/THROAT SWELLING, SOB, OR LIGHTHEADEDNESS WITH HYPOTENSION:  #  #  YES  #  #  Has patient had a PCN reaction causing severe rash involving mucus membranes or skin necrosis: No Has patient had a PCN reaction that required hospitalization: No Has patient had a PCN reaction occurring within the last 10 years: No If all of the above answers are "NO", then may proceed with Cephalosporin use.    Chocolate Nausea And Vomiting   Tape Itching, Rash and Other (See Comments)    Consultations: Nephrology Gastroenterology  Palliative Care Medicine    Procedures/Studies: CT ABDOMEN PELVIS WO CONTRAST  Result Date: 11/01/2020 CLINICAL DATA:  Right lower quadrant pain. Evaluate for appendicitis. EXAM: CT ABDOMEN AND PELVIS WITHOUT CONTRAST TECHNIQUE: Multidetector CT  imaging of the abdomen and pelvis was performed following the standard protocol without IV contrast. COMPARISON:  CT scan October 31, 2017 FINDINGS: Lower chest: Small bilateral pleural effusions are identified. There may be a loculated component of pleural fluid anteriorly on the right. Three-vessel coronary artery disease identified. Mild cardiomegaly. The lung bases are normal. Hepatobiliary: The liver is normal in appearance. The gallbladder appears to be thick walled with adjacent stranding. The gallbladder appears to contain high attenuation material. Pancreas: Unremarkable. No pancreatic ductal dilatation or surrounding inflammatory changes. Spleen: Normal in size without focal abnormality. Adrenals/Urinary Tract: The adrenal glands are unremarkable. The native kidneys are atrophic bilaterally. No hydronephrosis or acute perinephric stranding identified. Vascular calcifications are noted. No suspicious masses. No ureterectasis or stones associated with the native kidneys. A transplant kidney is seen in the right side of the pelvis with no significant adjacent fat stranding identified. There is mild prominence of the transplant kidneys pelvis, calices, and ureter without an identified stone. There is increased attenuation in the fat adjacent to the transplant kidneys renal pelvis. The bladder is unremarkable. Stomach/Bowel: The stomach and small bowel are normal. Scattered colonic diverticuli are seen without diverticulitis. The appendix is normal. Vascular/Lymphatic: Atherosclerotic change is seen in the nonaneurysmal aorta, iliac vessels, and femoral vessels. Reproductive: Uterus and bilateral adnexa are unremarkable. Other: Increased attenuation throughout the subcutaneous fat diffusely consistent with edema. A small amount of free fluid is seen in the pelvis. Musculoskeletal: No acute or significant osseous findings. IMPRESSION: 1. The patient has a transplanted kidney in the right pelvis. There is mild  associated pelvicaliectasis which is age indeterminate. There is mild increased attenuation in the fat adjacent to the right renal pelvis. The right ureter  is prominent but no stones are seen. The findings are age indeterminate and of uncertain etiology. Recommend clinical correlation. 2. The gallbladder wall is thickened. The gallbladder contains high attenuation material. Fat stranding adjacent to the gallbladder. Recommend ultrasound for better evaluation. 3. The appendix is normal in appearance. 4. Bilateral pleural effusions. 5. Diffuse soft tissue edema.  Mild ascites in the pelvis. 6. Atherosclerotic change in the nonaneurysmal aorta, iliac vessels, and femoral vessels. Coronary artery disease. 7. Scattered colonic diverticuli without diverticulitis. Electronically Signed   By: Dorise Bullion III M.D.   On: 11/01/2020 14:56   DG Chest 2 View  Result Date: 11/06/2020 CLINICAL DATA:  Shortness of breath EXAM: CHEST - 2 VIEW COMPARISON:  11/01/2020 FINDINGS: Moderate to large right pleural effusion and associated atelectasis or consolidation, increased compared to prior examination. Mild diffuse interstitial pulmonary opacity. Unchanged gross cardiomegaly. IMPRESSION: 1. Moderate to large right pleural effusion and associated atelectasis or consolidation, increased compared to prior examination. 2. Cardiomegaly and diffuse bilateral interstitial pulmonary opacity, likely edema. Electronically Signed   By: Eddie Candle M.D.   On: 11/06/2020 13:04   DG Chest 2 View  Result Date: 10/15/2020 CLINICAL DATA:  CHF. Shortness of breath and bilateral leg swelling. EXAM: CHEST - 2 VIEW COMPARISON:  08/21/2020 FINDINGS: Cardiac silhouette remains enlarged with unchanged pulmonary vascular congestion. Curvilinear densities in the mid and lower lungs bilaterally are compatible with atelectasis, mildly improved from prior. There are small pleural effusions, slightly decreased from prior. No pneumothorax is  identified. No acute osseous abnormality is seen. IMPRESSION: Cardiomegaly with unchanged pulmonary vascular congestion and persistent small pleural effusions. Mildly improved bibasilar atelectasis. Electronically Signed   By: Logan Bores M.D.   On: 10/15/2020 13:17   US Renal Transplant w/Doppler  Result Date: 11/03/2020 CLINICAL DATA:  Acute kidney injury and status post prior renal transplant in 2020. EXAM: ULTRASOUND OF RENAL TRANSPLANT WITH RENAL DOPPLER ULTRASOUND TECHNIQUE: Ultrasound examination of the renal transplant was performed with gray-scale, color and duplex doppler evaluation. COMPARISON:  CT of the abdomen and pelvis without contrast on 11/01/2020. Imaging from prior transplant renal ultrasound on 07/13/2020 at [facility]. FINDINGS: Transplant kidney location: RLQ Transplant Kidney: Renal measurements: 10.6 x 4.8 x 4.2 cm = volume: 112 mL. Normal in size and parenchymal echogenicity. No evidence of mass or hydronephrosis. No peri-transplant fluid collection seen. Color flow in the main renal artery:  Yes Color flow in the main renal vein:  Yes Duplex Doppler Evaluation: Main Renal Artery Velocity: 70 cm/sec Main Renal Artery Resistive Index: 0.81 Venous waveform in main renal vein:  Present Intrarenal resistive index in upper pole:  0.61 (normal 0.6-0.8; equivocal 0.8-0.9; abnormal >= 0.9) Intrarenal resistive index in lower pole: 0.62 (normal 0.6-0.8; equivocal 0.8-0.9; abnormal >= 0.9) Bladder: Normal for degree of bladder distention. Other findings: No free fluid around the transplant kidney or focal fluid collection. IMPRESSION: Unremarkable ultrasound of a transplant kidney in the right pelvis. Electronically Signed   By: Aletta Edouard M.D.   On: 11/03/2020 16:28   MR LIVER WO CONRTAST  Result Date: 11/03/2020 CLINICAL DATA:  History of end-stage renal disease EXAM: MRI ABDOMEN WITHOUT CONTRAST TECHNIQUE: Multiplanar multisequence MR imaging was performed without the administration  of intravenous contrast. COMPARISON:  Abdominal ultrasound dated November 01, 2020 FINDINGS: Lower chest: Cardiomegaly. No pericardial effusion. Small bilateral pleural effusions and atelectasis. Hepatobiliary: Marked signal loss is seen throughout the liver on T2 imaging with increased signal on out of phase imaging. Can not  evaluate hepatic lesion due to lack of IV contrast. Pancreas: No inflammatory changes or other parenchymal abnormality identified. Spleen:  Normal size.  Diffuse signal loss on T2 imaging. Adrenals/Urinary Tract: Atrophic native kidneys. No evidence of hydronephrosis. T2 hyperintense lesion of the upper pole of the left kidney, likely a simple cyst T1 hyperintense lesion at the mid region of the right kidney, likely a proteinaceous or hemorrhagic cyst, although evaluation is limited due to lack contrast. Right lower quadrant renal transplant with no evidence of hydronephrosis. Stomach/Bowel: Visualized portions within the abdomen are unremarkable. Vascular/Lymphatic: No pathologically enlarged lymph nodes identified. No abdominal aortic aneurysm demonstrated. Other:  Diffuse soft tissue anasarca and trace abdominal ascites. Musculoskeletal: Diffuse signal loss on T2 imaging. No suspicious bone lesions identified. IMPRESSION: Liver lesion which was seen on prior ultrasound is not visualized, likely due to lack of IV contrast. If multiphase contrast-enhanced exam of the liver can not be performed, follow-up ultrasound is recommended in 3-6 months to assess stability. Diffuse wall thickening of the gallbladder, likely secondary to systemic process such as heart failure. If there is clinical concern for acute cholecystitis HIDA scan could be performed. Liver and spleen demonstrate diffuse signal loss on T2 imaging, compatible with secondary hemochromatosis. Diffuse soft tissue anasarca and trace abdominal ascites. Electronically Signed   By: Yetta Glassman M.D.   On: 11/03/2020 12:30   DG  CHEST PORT 1 VIEW  Result Date: 11/10/2020 CLINICAL DATA:  Shortness of breath.  History of renal failure. EXAM: PORTABLE CHEST 1 VIEW COMPARISON:  11/06/2020 FINDINGS: The cardiac silhouette remains enlarged. There are small to moderate bilateral pleural effusions, smaller on the right compared to the prior study but new on the left. There are bibasilar airspace opacities which have increased on the left and improved on the right. No pneumothorax is identified. IMPRESSION: Small to moderate bilateral pleural effusions with bibasilar atelectasis or consolidation, improved on the right and increased on the left. Electronically Signed   By: Logan Bores M.D.   On: 11/10/2020 06:56   DG Chest Portable 1 View  Result Date: 11/01/2020 CLINICAL DATA:  Pleural effusion EXAM: PORTABLE CHEST 1 VIEW COMPARISON:  10/15/2020, same day CT abdomen pelvis, 08/31/2020 FINDINGS: Gross cardiomegaly. Small left pleural effusion and associated atelectasis or consolidation, not significant changed in appearance compared to prior radiographs dated 08/31. The visualized skeletal structures are unremarkable. IMPRESSION: 1.  Gross cardiomegaly. 2. Small left pleural effusion and associated atelectasis or consolidation, not significantly changed in appearance compared to prior radiographs dated 08/31. Electronically Signed   By: Eddie Candle M.D.   On: 11/01/2020 15:40   US Abdomen Limited RUQ (LIVER/GB)  Result Date: 11/01/2020 CLINICAL DATA:  Abnormality seen on CT imaging. Right upper quadrant pain. EXAM: ULTRASOUND ABDOMEN LIMITED RIGHT UPPER QUADRANT COMPARISON:  CT scan November 01, 2020 FINDINGS: Gallbladder: Gallbladder wall is thickened to 1.1 cm. No stones, sludge, or Murphy's sign. Common bile duct: Diameter: The common bile duct measures 2 mm on limited imaging/visualization. Liver: A hyperechoic mass is seen in the liver measuring 1.3 x 1.1 x 1.0 cm. No other abnormalities. Portal vein is patent on color Doppler  imaging with normal direction of blood flow towards the liver. Other: None. IMPRESSION: 1. The gallbladder wall is thickened to 1.1 cm. No stones, sludge, or Murphy's sign. The gallbladder wall thickening is a nonspecific finding given the absence of other findings. If the clinical picture remains ambiguous, a HIDA scan could further evaluate. 2. Evaluation of the common bile  duct is limited but the visualized portion is normal in caliber. 3. The hyperechoic mass in the liver measuring up to 1.3 cm is nonspecific but most likely a hemangioma. This mass is somewhat irregular in shape. Recommend either a CT scan with contrast and delayed imaging for confirmation or an MRI. Electronically Signed   By: Dorise Bullion III M.D.   On: 11/01/2020 16:38     Subjective: Seen and examined at bedside and she was stable and laying in bed in NAD. SOB is improved. No CP. Has no complaints or concerns. Ready to be D/C'd and continue rehabilitative efforts at SNF.   Discharge Exam: Vitals:   11/11/20 0421 11/11/20 1204  BP: (!) 153/92 (!) 144/89  Pulse: 75 73  Resp: 18 16  Temp: (!) 97.5 F (36.4 C) 97.8 F (36.6 C)  SpO2: 97% 98%   Vitals:   11/10/20 1241 11/10/20 1957 11/11/20 0421 11/11/20 1204  BP: (!) 152/100 (!) 153/89 (!) 153/92 (!) 144/89  Pulse: 77 76 75 73  Resp: _0 Temp: 98.3 F (36.8 C) 98 F (36.7 C) (!) 97.5 F (36.4 C) 97.8 F (36.6 C)  TempSrc: Oral Oral Oral   SpO2: 97% 100% 97% 98%  Weight:   92.9 kg   Height:       General: Pt is alert, awake, not in acute distress Cardiovascular: RRR, S1/S2 +, no rubs, no gallops Respiratory: Diminished bilaterally with coarse breath sounds, no wheezing, no rhonchi; Unlabored breathing  Abdominal: Soft, NT, Distended 2/2 body habitus, bowel sounds + Extremities: Mild to 1+ LE edema, no cyanosis  The results of significant diagnostics from this hospitalization (including imaging, microbiology, ancillary and laboratory) are listed  below for reference.    Microbiology: Recent Results (from the past 240 hour(s))  Urine Culture     Status: Abnormal   Collection Time: 11/01/20  4:18 PM   Specimen: In/Out Cath Urine  Result Value Ref Range Status   Specimen Description IN/OUT CATH URINE  Final   Special Requests   Final    Immunocompromised Performed at [facility], [address] [place], [place] [address]    Culture >=100,000 COLONIES/mL PSEUDOMONAS AERUGINOSA (A)  Final   Report Status 11/04/2020 FINAL  Final   Organism ID, Bacteria PSEUDOMONAS AERUGINOSA (A)  Final      Susceptibility   Pseudomonas aeruginosa - MIC*    CEFTAZIDIME 16 INTERMEDIATE Intermediate     CIPROFLOXACIN <=0.25 SENSITIVE Sensitive     GENTAMICIN 2 SENSITIVE Sensitive     IMIPENEM 1 SENSITIVE Sensitive     CEFEPIME RESISTANT Resistant     * >=100,000 COLONIES/mL PSEUDOMONAS AERUGINOSA  SARS CORONAVIRUS 2 (TAT 6-24 HRS) Nasopharyngeal Nasopharyngeal Swab     Status: None   Collection Time: 11/01/20 11:14 PM   Specimen: Nasopharyngeal Swab  Result Value Ref Range Status   SARS Coronavirus 2 NEGATIVE NEGATIVE Final    Comment: (NOTE) SARS-CoV-2 target nucleic acids are NOT DETECTED.  The SARS-CoV-2 RNA is generally detectable in upper and lower respiratory specimens during the acute phase of infection. Negative results do not preclude SARS-CoV-2 infection, do not rule out co-infections with other pathogens, and should not be used as the sole basis for treatment or other patient management decisions. Negative results must be combined with clinical observations, patient history, and epidemiological information. The expected result is Negative.  Fact Sheet for Patients: SugarRoll.be  Fact Sheet for Healthcare Providers: https://www.woods-mathews.com/  This test is not yet  approved or cleared by the [place] and  has been authorized for detection and/or diagnosis of  SARS-CoV-2 by FDA under an Emergency Use Authorization (EUA). This EUA will remain  in effect (meaning this test can be used) for the duration of the COVID-19 declaration under Se ction 564(b)(1) of the Act, 21 U.S.C. section 360bbb-3(b)(1), unless the authorization is terminated or revoked sooner.  Performed at [facility], [place] [address], [place], [place] [address]     Labs: BNP (last 3 results) Recent Labs    10/15/20 1811 11/06/20 1246 11/10/20 0223  BNP >4,500.0* >4,500.0* >3,614.4*   Basic Metabolic Panel: Recent Labs  Lab 11/06/20 0439 11/07/20 0233 11/08/20 0559 11/09/20 0539 11/10/20 0223 11/11/20 0601 11/11/20 1044  NA 132* 132* 133* 132* 134* 133* 133*  K 4.6 4.2 4.3 4.2 4.4 6.1* 4.7  CL 102 103 105 102 103 104 104  CO2 18* 18* 17* 19* 18* 17* 15*  GLUCOSE 90 88 90 91 78 82 75  BUN 82* 76* 77* 74* 75* 80* 79*  CREATININE 4.55* 4.24* 3.79* 3.62* 3.40* 3.53* 3.47*  CALCIUM 9.4 9.0 9.0 9.3 9.3 9.4 9.7  MG 2.5* 2.6*  --  2.5* 2.4 2.5*  --   PHOS 5.5* 4.9* 4.8* 4.5 4.6 4.8*  --    Liver Function Tests: Recent Labs  Lab 11/05/20 0507 11/06/20 0439 11/07/20 0233 11/08/20 0559 11/09/20 0539 11/10/20 0223 11/11/20 0601  AST 23 29  --   --   --  24 46*  ALT 23 23  --   --   --  20 19  ALKPHOS 104 105  --   --   --  93 88  BILITOT 0.7 0.9  --   --   --  1.5* 1.1  PROT 5.9* 6.1*  --   --   --  6.3* 6.0*  ALBUMIN 2.7* 2.8*  2.9* 2.7* 2.6* 3.0* 2.9* 2.8*   No results for input(s): LIPASE, AMYLASE in the last 168 hours. No results for input(s): AMMONIA in the last 168 hours. CBC: Recent Labs  Lab 11/06/20 0439 11/07/20 0233 11/09/20 0539 11/10/20 0223 11/11/20 0601  WBC 3.4* 2.8* 2.8* 2.3* 2.2*  NEUTROABS  --   --  2.0 1.6* 1.3*  HGB 10.6* 10.5* 11.5* 11.0* 10.8*  HCT 33.8* 32.7* 35.8* 33.1* 33.1*  MCV 92.6 91.9 91.6 89.9 90.4  PLT 86* 77* 69* 71* 65*   Cardiac Enzymes: No results for input(s): CKTOTAL, CKMB, CKMBINDEX, TROPONINI in the  last 168 hours. BNP: Invalid input(s): POCBNP CBG: No results for input(s): GLUCAP in the last 168 hours. D-Dimer No results for input(s): DDIMER in the last 72 hours. Hgb A1c No results for input(s): HGBA1C in the last 72 hours. Lipid Profile No results for input(s): CHOL, HDL, LDLCALC, TRIG, CHOLHDL, LDLDIRECT in the last 72 hours. Thyroid function studies No results for input(s): TSH, T4TOTAL, T3FREE, THYROIDAB in the last 72 hours.  Invalid input(s): FREET3 Anemia work up No results for input(s): VITAMINB12, FOLATE, FERRITIN, TIBC, IRON, RETICCTPCT in the last 72 hours. Urinalysis    Component Value Date/Time   COLORURINE YELLOW 11/01/2020 2112   APPEARANCEUR CLOUDY (A) 11/01/2020 2112   LABSPEC 1.025 11/01/2020 2112   PHURINE 6.0 11/01/2020 2112   GLUCOSEU NEGATIVE 11/01/2020 2112   HGBUR NEGATIVE 11/01/2020 2112   BILIRUBINUR NEGATIVE 11/01/2020 2112   BILIRUBINUR neg 12/19/2014 [place] 11/01/2020 2112   PROTEINUR 100 (A) 11/01/2020 2112  UROBILINOGEN 0.2 12/19/2014 1157   UROBILINOGEN 0.2 05/23/2014 1301   NITRITE NEGATIVE 11/01/2020 2112   LEUKOCYTESUR MODERATE (A) 11/01/2020 2112   Sepsis Labs Invalid input(s): PROCALCITONIN,  WBC,  LACTICIDVEN Microbiology Recent Results (from the past 240 hour(s))  Urine Culture     Status: Abnormal   Collection Time: 11/01/20  4:18 PM   Specimen: In/Out Cath Urine  Result Value Ref Range Status   Specimen Description IN/OUT CATH URINE  Final   Special Requests   Final    Immunocompromised Performed at [facility], [place] [address], [place], [place] [address]    Culture >=100,000 COLONIES/mL PSEUDOMONAS AERUGINOSA (A)  Final   Report Status 11/04/2020 FINAL  Final   Organism ID, Bacteria PSEUDOMONAS AERUGINOSA (A)  Final      Susceptibility   Pseudomonas aeruginosa - MIC*    CEFTAZIDIME 16 INTERMEDIATE Intermediate     CIPROFLOXACIN <=0.25 SENSITIVE Sensitive     GENTAMICIN 2 SENSITIVE  Sensitive     IMIPENEM 1 SENSITIVE Sensitive     CEFEPIME RESISTANT Resistant     * >=100,000 COLONIES/mL PSEUDOMONAS AERUGINOSA  SARS CORONAVIRUS 2 (TAT 6-24 HRS) Nasopharyngeal Nasopharyngeal Swab     Status: None   Collection Time: 11/01/20 11:14 PM   Specimen: Nasopharyngeal Swab  Result Value Ref Range Status   SARS Coronavirus 2 NEGATIVE NEGATIVE Final    Comment: (NOTE) SARS-CoV-2 target nucleic acids are NOT DETECTED.  The SARS-CoV-2 RNA is generally detectable in upper and lower respiratory specimens during the acute phase of infection. Negative results do not preclude SARS-CoV-2 infection, do not rule out co-infections with other pathogens, and should not be used as the sole basis for treatment or other patient management decisions. Negative results must be combined with clinical observations, patient history, and epidemiological information. The expected result is Negative.  Fact Sheet for Patients: SugarRoll.be  Fact Sheet for Healthcare Providers: https://www.woods-mathews.com/  This test is not yet approved or cleared by the [place] FDA and  has been authorized for detection and/or diagnosis of SARS-CoV-2 by FDA under an Emergency Use Authorization (EUA). This EUA will remain  in effect (meaning this test can be used) for the duration of the COVID-19 declaration under Se ction 564(b)(1) of the Act, 21 U.S.C. section 360bbb-3(b)(1), unless the authorization is terminated or revoked sooner.  Performed at [facility], [place] [address], [place], [place] [address]    Time coordinating discharge: 35 minutes  SIGNED:  Kerney Elbe, DO [facility] 11/11/2020, 1:52 PM Pager is on [place]  If 7PM-7AM, please contact night-coverage [email or web address]

## 2020-11-11 NOTE — Progress Notes (Signed)
Mobility Specialist Progress Note:   11/11/20 0928  Mobility  Activity Ambulated in hall;Transferred to/from [facility]  Range of Motion/Exercises Active;All extremities  Level of Assistance Contact guard assist, steadying assist  Assistive Device Front wheel walker  Minutes Ambulated 5 minutes  Distance Ambulated (ft) 42 ft  Mobility Ambulated with assistance in room  Mobility Response Tolerated well  Mobility performed by Mobility specialist  Bed Position Chair  Transport method Ambulatory  $Mobility charge 1 Mobility   Pt was received on BSC. Agreed to mobility inside the room. Ambulated 25' with RW and contact G due to pt request. x2 standing breaks at 61f and 151f Pt left in chair with call bell in reach and all needs met.   During Mobility: HR 83 bpm Post Mobility: HR 71 bpm  AnNelta Numbersobility Specialist  Phone [phone]

## 2020-11-11 NOTE — TOC Progression Note (Addendum)
Transition of Care [facility]) - Progression Note    Patient Details  Name: Jenna [name] MRN: [id number] Date of Birth: 19-Sep-1955  Transition of Care [facility]) CM/SW [place], [place] Phone Number: 11/11/2020, 12:44 PM  Clinical Narrative:    CSW spoke with pt about DC to a SNF pt requested [facility] since she lives on the same street and has been there before. Pt said she would like to speak with he doctor before she DC. CSW contacted [facility] for bed availability, Juliann Pulse at [facility] states she may have a bed she will wait on pt DC Summary.   CSW spoke with Juliann Pulse at [facility] who is waiting on covid test and DC Summary, CSW will update Juliann Pulse when test and summary are received.  CSW received pt DC Summary but COVID results are not back, pt will likely DC in the morning due to covid test results for intake  At [facility].  Expected Discharge Plan: [place] Barriers to Discharge: Continued Medical Work up  Expected Discharge Plan and Services Expected Discharge Plan: [place]   Discharge Planning Services: CM Consult   Living arrangements for the past 2 months: Single Family Home                   DME Agency: NA       HH Arranged: NA           Social Determinants of Health (SDOH) Interventions    Readmission Risk Interventions Readmission Risk Prevention Plan 11/05/2020 08/26/2020  Transportation Screening Complete Complete  PCP or Specialist Appt within 3-5 Days Complete -  HRI or [place] Complete Complete  Social Work Consult for [place] Planning/Counseling Complete Complete  Palliative Care Screening Not Applicable Not Applicable  Medication Review Press photographer) Complete Complete  Some recent data might be hidden

## 2020-11-11 NOTE — Plan of Care (Signed)
Patient progressing 

## 2020-11-11 NOTE — Progress Notes (Signed)
This chaplain is present with the Pt. for a spiritual care visit.  The Pt. regrets she missed the chaplain's previous visit.  The chaplain understands through reflective listening the Pt. has experienced several illnesses in her lifetime.  The Pt. questions the meaning of the illnesses with the chaplain and wonders about the purpose of the most recent hospital admission. The chaplain learns the Pt. remains hopeful dialysis will not be needed. As the Pt. tells her story, the chaplain highlights the Pt. reoccurring theme of her faith and prayer as a source of strength.  The Pt. speaks of her daughter and sister as relationally supportive in her family.   The Pt. accepts the chaplain's invitation for prayer and F/U spiritual care.

## 2020-11-12 ENCOUNTER — Telehealth: Payer: Self-pay

## 2020-11-12 DIAGNOSIS — I2510 Atherosclerotic heart disease of native coronary artery without angina pectoris: Secondary | ICD-10-CM | POA: Diagnosis not present

## 2020-11-12 DIAGNOSIS — N179 Acute kidney failure, unspecified: Secondary | ICD-10-CM | POA: Diagnosis not present

## 2020-11-12 DIAGNOSIS — F064 Anxiety disorder due to known physiological condition: Secondary | ICD-10-CM

## 2020-11-12 DIAGNOSIS — N12 Tubulo-interstitial nephritis, not specified as acute or chronic: Secondary | ICD-10-CM | POA: Diagnosis not present

## 2020-11-12 NOTE — Telephone Encounter (Signed)
Transition Care Management Unsuccessful Follow-up Telephone Call  Date of discharge and from where:  Jenna [name] 11/12/2020  Attempts:  1st Attempt  Reason for unsuccessful TCM follow-up call:  Unable to reach patient   Patient discharged to SNF , with PCP follow up in 2 weeks .

## 2020-11-12 NOTE — Care Management Important Message (Signed)
Important Message  Patient Details  Name: Jenna [name] MRN: [id number] Date of Birth: 1956-01-02   Medicare Important Message Given:  Yes     [name] 11/12/2020, 8:13 AM

## 2020-11-12 NOTE — TOC Transition Note (Signed)
Transition of Care [facility]) - CM/SW Discharge Note   Patient Details  Name: Jenna [name] MRN: [id number] Date of Birth: [birth year]  Transition [facility]) CM/SW Contact:  Tresa Endo Phone Number: 11/12/2020, 12:04 PM   Clinical Narrative:    Patient will DC to: [place] date: 11/12/2020 Family notified: Pt daughter Transport by: Corey Harold   Per MD patient ready for DC to [facility] Room 111. RN to call report prior to discharge [phone]). RN, patient, patient's family, and facility notified of DC. Discharge Summary and FL2 sent to facility. DC packet on chart. Ambulance transport requested for patient.   CSW will sign off for now as social work intervention is no longer needed. Please consult [name] again if new needs arise.       Barriers to Discharge: Continued Medical Work up   Patient Goals and CMS Choice Patient states their goals for this hospitalization and ongoing recovery are:: return home   Choice offered to / list presented to : NA  Discharge Placement                       Discharge Plan and Services   Discharge Planning Services: CM Consult              DME Agency: NA       HH Arranged: NA          Social Determinants of Health (SDOH) Interventions     Readmission Risk Interventions Readmission Risk Prevention Plan 11/05/2020 08/26/2020  Transportation Screening Complete Complete  PCP or Specialist Appt within 3-5 Days Complete -  HRI or [place] Complete Complete  Social Work Consult for [place] Planning/Counseling Complete Complete  Palliative Care Screening Not Applicable Not Applicable  Medication Review Press photographer) Complete Complete  Some recent data might be hidden

## 2020-11-12 NOTE — Progress Notes (Signed)
Report given to [place] health care RN all questions answered

## 2020-11-12 NOTE — Discharge Summary (Signed)
Physician Discharge Summary  Jenna [name]:[id number] DOB: 09/18/1955 DOA: 11/01/2020  PCP: [name], Betty G, MD  Admit date: 11/01/2020 Discharge date: 11/12/2020  Admitted From:  Home Disposition: [facility].  Recommendations for Outpatient Follow-up:  Follow up with PCP in 1-2 weeks Follow up with Gastroenterology Dr. Lorenso Courier on 12/03/20 at 11:10 AM Follow up with Nephrology within 1-2 weeks Follow up with Cardiology within 1-2 weeks  Tacrolimus level decreased to 0.5 mg p.o. twice daily due to elevated tacrolimus level Recommend repeat liver ultrasound 3-6 months for questionable liver lesion Please obtain CMP/CBC, Mag, Phos in one week   Discharge Condition: Stable CODE STATUS: Full code Diet recommendation: Heart healthy/Renal diet with 1569mL fluid restriction  History of present illness:  Jenna [name] is a 65 y.o. 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  Patient reports seeing dark black color stool beginning about 1 week ago.  She did see some bright red blood in her stool 2 days ago but otherwise has been black and soft in consistency.  She has not seen any other obvious bleeding.  She takes aspirin 81 mg daily.  She was previously on Eliquis for history of atrial fibrillation but has not been on this recently for unclear reasons (review of previous notes indicate recommendations to stay on Eliquis prior to this admission).  She has been having some lower abdominal discomfort.  She says that she still does make urine but has decreased output than  expected.  She denies any dysuria.  She reports chronic swelling of her legs which is unchanged.  She denies any chest pain, dyspnea, cough, fevers, chills, diaphoresis.   In the ED, BP 110/70, pulse 57, RR 16, temp 98.8 F, SPO2 97% on room air. BUN 85, creatinine 4.73, GFR 10 (last creatinine 3.33 with GFR 15 on 10/15/2020), sodium 131, potassium 5.0, bicarb 19, serum glucose 101, LFTs within normal limits, WBC 3.5, hemoglobin 10.6, platelets 96,000, lipase 26. FOBT is positive. Urinalysis shows 100 protein, negative nitrites, moderate leukocytes, 0-5 RBC/hpf, >50 WBC/hpf, many bacteria on microscopy.  CT abdomen/pelvis without contrast shows transplanted kidney in the right pelvis with mild associated pelviectasis which is age-indeterminate.  Mild increased attenuation in the fat adjacent to the right renal pelvis noted.  Right ureter is prominent without evidence of stones.  Gallbladder wall is thickened.  Appendix appears normal.  Bilateral pleural effusions, diffuse soft tissue edema, mild ascites in the pelvis noted.  Atherosclerotic change in the nonaneurysmal aorta, iliac vessels, and femoral vessels noted as well as CAD.  Scattered colonic diverticuli without diverticulitis reported. Portable chest x-ray shows gross cardiomegaly with stable small left pleural effusion associated atelectasis. RUQ abdominal ultrasound shows gallbladder wall thickened to 1.1 cm without evidence of stones, sludge, Murphy sign.  Limited CBD evaluation is normal in caliber.  Hyperechoic mass in the liver measuring up to 1.3 cm is seen.   Patient was given 500 cc normal saline, IV ceftriaxone, IV fentanyl 50 mcg x 2, IV Zofran, IV Protonix 80 mg followed by continuous infusion.  EDP discussed with on-call nephrology who felt patient was stable to remain at [facility] from  a renal perspective.  The hospitalist service was consulted to admit for further evaluation and management.  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  Nephrology was consulted and followed during hospital course.  Tacrolimus level was elevated at 19 and nephrology recommended decreasing tacrolimus to 0.5 mg p.o. twice daily.  Patient was also started on sodium bicarb and 650 mg p.o. 3 times daily.  Home torsemide resumed.  Creatinine 3.47 at time of discharge.  Recommend repeat BMP 1 week.  Outpatient follow-up with nephrology.  Pseudomonas UTI/pyonephritis with transplanted kidney Patient completed 7-day course of IV antibiotics with meropenem.   Dark/heme positive stool with mild anemia: Reports 1 week of black stools with one episode of red blood in stools 2 days prior to admission.  Started on IV PPI due to concern for upper GI bleed.  Gastroenterology was consulted and recommended twice daily PPI and outpatient follow-up for possible EGD.  Hemoglobin remained stable during hospitalization with no overt bleeding.  Hemoglobin 10.8 at time of discharge.  Recommend CBC 1 week.   Acute on chronic HFrEF: EF 20-25%, follows with [facility] cardiology who are considering ICD placement.  Has chronic edema with small pleural effusion but otherwise no respiratory symptoms or evidence of decompensated CHF.  Continue home BiDil, Toprol-XL, torsemide.  Outpatient follow-up with cardiology.  Right sided pleural effusion Etiology is likely secondary to underlying CHF and renal failure.  Patient offered ultrasound-guided thoracentesis which she declined.  Pleural effusion is chronic and torsemide now resumed.  Oxygenating well on room air.   Paroxysmal atrial fibrillation with bradycardia: In sinus bradycardia on admission, rate maintaining 40-50.  Appears asymptomatic from the standpoint.  CHA2DS2-VASc score 7.   Previously on Eliquis, will not restart given potential upper GI bleed.    CAD s/p STEMI and DES to LAD 10/2018: Denies any chest pain.  Continue rosuvastatin.  Aspirin, Toprol-XL.   Thrombocytopenia: Mild, concern for potential upper GI bleed as above.  Hold aspirin and pharmacologic VTE prophylaxis.   Pancytopenia In the setting of chronic immunosuppression; stable   Liver mass: Hyperechoic mass in the liver measuring up to 1.3 cm seen on ultrasound, nonspecific but most likely a hemangioma per radiology read.  Given irregular shape, follow-up imaging recommended per radiology.  MRI without contrast liver 9/19 with no liver lesion visualized but likely due to lack of IV contrast.  Given her inability to have IV contrast, radiology recommends follow-up ultrasound in 3-6 months.  History of CMV viremia: Continue Valcyte.   Anxiety: Continue Celexa.  Generalized weakness/deconditioning: Discharging to SNF for further rehabilitation.  Discharge Diagnoses:  Principal Problem:   Acute renal failure superimposed on stage 4 chronic kidney disease ([name]) Active Problems:   Lupus (systemic lupus erythematosus) ([name])   Hypertension with renal disease   Dyslipidemia   HFrEF (heart failure with reduced ejection fraction) ([name])   CAD (coronary artery disease)   Paroxysmal atrial fibrillation ([name])   Anxiety disorder   Pyelonephritis of transplanted kidney   Thrombocytopenia ([name])   Liver mass   Heme positive stool   AKI (acute kidney injury) ([place])   Right upper quadrant pain   Shortness of breath    Discharge Instructions  Discharge Instructions     Call MD for:  difficulty breathing, headache or visual disturbances  Complete by: As directed    Call MD for:  extreme fatigue   Complete by: As directed    Call MD for:  hives   Complete by: As directed    Call MD for:  persistant dizziness or light-headedness   Complete by: As directed    Call MD for:  persistant nausea and  vomiting   Complete by: As directed    Call MD for:  redness, tenderness, or signs of infection (pain, swelling, redness, odor or green/yellow discharge around incision site)   Complete by: As directed    Call MD for:  severe uncontrolled pain   Complete by: As directed    Call MD for:  temperature >100.4   Complete by: As directed    Diet - low sodium heart healthy   Complete by: As directed    1500 mL Fluid Restriction   Discharge instructions   Complete by: As directed    You were cared for by a hospitalist during your hospital stay. If you have any questions about your discharge medications or the care you received while you were in the hospital after you are discharged, you can call the unit and ask to speak with the hospitalist on call if the hospitalist that took care of you is not available. Once you are discharged, your primary care physician will handle any further medical issues. Please note that NO REFILLS for any discharge medications will be authorized once you are discharged, as it is imperative that you return to your primary care physician (or establish a relationship with a primary care physician if you do not have one) for your aftercare needs so that they can reassess your need for medications and monitor your lab values.  Follow up with PCP, Nephrology, Gastroenterology, and Cardiology in the outpatient setting. Take all medications as prescribed. If symptoms change or worsen please return to the ED for evaluation   Increase activity slowly   Complete by: As directed    Increase activity slowly   Complete by: As directed       Allergies as of 11/12/2020       Reactions   Enalapril Maleate Anaphylaxis, Swelling, Other (See Comments)   Throat swells   Penicillins Other (See Comments)   Made patient lightheaded PATIENT HAS HAD A PCN REACTION WITH IMMEDIATE RASH, FACIAL/TONGUE/THROAT SWELLING, SOB, OR LIGHTHEADEDNESS WITH HYPOTENSION:  #  #  YES  #  #  Has patient had a  PCN reaction causing severe rash involving mucus membranes or skin necrosis: No Has patient had a PCN reaction that required hospitalization: No Has patient had a PCN reaction occurring within the last 10 years: No If all of the above answers are "NO", then may proceed with Cephalosporin use.   Chocolate Nausea And Vomiting   Tape Itching, Rash, Other (See Comments)        Medication List     TAKE these medications    acetaminophen 500 MG tablet Commonly known as: TYLENOL Take 1,000 mg by mouth every 6 (six) hours as needed for headache.   ALPRAZolam 0.25 MG tablet Commonly known as: XANAX Take 1 tablet (0.25 mg total) by mouth at bedtime as needed for anxiety.   aspirin 81 MG EC tablet Take 81 mg by mouth every morning.   citalopram 10 MG tablet Commonly known as: CeleXA Take 1 tablet (10 mg total) by mouth daily.   gabapentin 100 MG capsule Commonly known as: NEURONTIN Take 1 capsule (100 mg total)  by mouth at bedtime.   hydroxychloroquine 200 MG tablet Commonly known as: PLAQUENIL Take 1 tablet (200 mg total) by mouth 2 (two) times daily. What changed: when to take this   isosorbide-hydrALAZINE 20-37.5 MG tablet Commonly known as: BIDIL Take 1 tablet by mouth 3 (three) times daily.   metoprolol succinate 25 MG 24 hr tablet Commonly known as: TOPROL-XL Take 1 tablet (25 mg total) by mouth daily.   nitroGLYCERIN 0.4 MG SL tablet Commonly known as: NITROSTAT Place 0.4 mg under the tongue every 5 (five) minutes as needed for chest pain.   ondansetron 4 MG tablet Commonly known as: ZOFRAN Take 1 tablet (4 mg total) by mouth every 6 (six) hours as needed for nausea.   pantoprazole 40 MG tablet Commonly known as: PROTONIX Take 1 tablet (40 mg total) by mouth 2 (two) times daily before a meal.   predniSONE 5 MG tablet Commonly known as: DELTASONE Take 5 mg by mouth daily with lunch.   rosuvastatin 5 MG tablet Commonly known as: CRESTOR Take 5 mg by mouth  daily at 6 PM.   simethicone 80 MG chewable tablet Commonly known as: MYLICON Chew 80 mg by mouth 4 (four) times daily as needed for flatulence.   sodium bicarbonate 650 MG tablet Take 1 tablet (650 mg total) by mouth 3 (three) times daily.   tacrolimus 0.5 MG capsule Commonly known as: PROGRAF Take 1 capsule (0.5 mg total) by mouth at bedtime. What changed: You were already taking a medication with the same name, and this prescription was added. Make sure you understand how and when to take each.   tacrolimus 0.5 MG capsule Commonly known as: PROGRAF Take 1 capsule (0.5 mg total) by mouth daily. What changed:  how much to take when to take this additional instructions   torsemide 20 MG tablet Commonly known as: DEMADEX Take 20 mg by mouth 2 (two) times daily.   valGANciclovir 450 MG tablet Commonly known as: VALCYTE Take 450 mg by mouth every other day.        Follow-up Information     [name], Betty G, MD Follow up on 11/11/2020.   Specialty: Family Medicine Why: 9:30 for hospital follow up Contact information: [place] [address] [phone]                Allergies  Allergen Reactions   Enalapril Maleate Anaphylaxis, Swelling and Other (See Comments)    Throat swells   Penicillins Other (See Comments)    Made patient lightheaded PATIENT HAS HAD A PCN REACTION WITH IMMEDIATE RASH, FACIAL/TONGUE/THROAT SWELLING, SOB, OR LIGHTHEADEDNESS WITH HYPOTENSION:  #  #  YES  #  #  Has patient had a PCN reaction causing severe rash involving mucus membranes or skin necrosis: No Has patient had a PCN reaction that required hospitalization: No Has patient had a PCN reaction occurring within the last 10 years: No If all of the above answers are "NO", then may proceed with Cephalosporin use.    Chocolate Nausea And Vomiting   Tape Itching, Rash and Other (See Comments)    Consultations: Nephrology Gastroenterology Palliative  care   Procedures/Studies: CT ABDOMEN PELVIS WO CONTRAST  Result Date: 11/01/2020 CLINICAL DATA:  Right lower quadrant pain. Evaluate for appendicitis. EXAM: CT ABDOMEN AND PELVIS WITHOUT CONTRAST TECHNIQUE: Multidetector CT imaging of the abdomen and pelvis was performed following the standard protocol without IV contrast. COMPARISON:  CT scan October 31, 2017 FINDINGS: Lower chest: Small bilateral pleural  effusions are identified. There may be a loculated component of pleural fluid anteriorly on the right. Three-vessel coronary artery disease identified. Mild cardiomegaly. The lung bases are normal. Hepatobiliary: The liver is normal in appearance. The gallbladder appears to be thick walled with adjacent stranding. The gallbladder appears to contain high attenuation material. Pancreas: Unremarkable. No pancreatic ductal dilatation or surrounding inflammatory changes. Spleen: Normal in size without focal abnormality. Adrenals/Urinary Tract: The adrenal glands are unremarkable. The native kidneys are atrophic bilaterally. No hydronephrosis or acute perinephric stranding identified. Vascular calcifications are noted. No suspicious masses. No ureterectasis or stones associated with the native kidneys. A transplant kidney is seen in the right side of the pelvis with no significant adjacent fat stranding identified. There is mild prominence of the transplant kidneys pelvis, calices, and ureter without an identified stone. There is increased attenuation in the fat adjacent to the transplant kidneys renal pelvis. The bladder is unremarkable. Stomach/Bowel: The stomach and small bowel are normal. Scattered colonic diverticuli are seen without diverticulitis. The appendix is normal. Vascular/Lymphatic: Atherosclerotic change is seen in the nonaneurysmal aorta, iliac vessels, and femoral vessels. Reproductive: Uterus and bilateral adnexa are unremarkable. Other: Increased attenuation throughout the subcutaneous fat  diffusely consistent with edema. A small amount of free fluid is seen in the pelvis. Musculoskeletal: No acute or significant osseous findings. IMPRESSION: 1. The patient has a transplanted kidney in the right pelvis. There is mild associated pelvicaliectasis which is age indeterminate. There is mild increased attenuation in the fat adjacent to the right renal pelvis. The right ureter is prominent but no stones are seen. The findings are age indeterminate and of uncertain etiology. Recommend clinical correlation. 2. The gallbladder wall is thickened. The gallbladder contains high attenuation material. Fat stranding adjacent to the gallbladder. Recommend ultrasound for better evaluation. 3. The appendix is normal in appearance. 4. Bilateral pleural effusions. 5. Diffuse soft tissue edema.  Mild ascites in the pelvis. 6. Atherosclerotic change in the nonaneurysmal aorta, iliac vessels, and femoral vessels. Coronary artery disease. 7. Scattered colonic diverticuli without diverticulitis. Electronically Signed   By: Dorise Bullion III M.D.   On: 11/01/2020 14:56   DG Chest 2 View  Result Date: 11/06/2020 CLINICAL DATA:  Shortness of breath EXAM: CHEST - 2 VIEW COMPARISON:  11/01/2020 FINDINGS: Moderate to large right pleural effusion and associated atelectasis or consolidation, increased compared to prior examination. Mild diffuse interstitial pulmonary opacity. Unchanged gross cardiomegaly. IMPRESSION: 1. Moderate to large right pleural effusion and associated atelectasis or consolidation, increased compared to prior examination. 2. Cardiomegaly and diffuse bilateral interstitial pulmonary opacity, likely edema. Electronically Signed   By: Eddie Candle M.D.   On: 11/06/2020 13:04   DG Chest 2 View  Result Date: 10/15/2020 CLINICAL DATA:  CHF. Shortness of breath and bilateral leg swelling. EXAM: CHEST - 2 VIEW COMPARISON:  08/21/2020 FINDINGS: Cardiac silhouette remains enlarged with unchanged pulmonary  vascular congestion. Curvilinear densities in the mid and lower lungs bilaterally are compatible with atelectasis, mildly improved from prior. There are small pleural effusions, slightly decreased from prior. No pneumothorax is identified. No acute osseous abnormality is seen. IMPRESSION: Cardiomegaly with unchanged pulmonary vascular congestion and persistent small pleural effusions. Mildly improved bibasilar atelectasis. Electronically Signed   By: Logan Bores M.D.   On: 10/15/2020 13:17   US Renal Transplant w/Doppler  Result Date: 11/03/2020 CLINICAL DATA:  Acute kidney injury and status post prior renal transplant in 2020. EXAM: ULTRASOUND OF RENAL TRANSPLANT WITH RENAL DOPPLER ULTRASOUND TECHNIQUE: Ultrasound examination  of the renal transplant was performed with gray-scale, color and duplex doppler evaluation. COMPARISON:  CT of the abdomen and pelvis without contrast on 11/01/2020. Imaging from prior transplant renal ultrasound on 07/13/2020 at [facility]. FINDINGS: Transplant kidney location: RLQ Transplant Kidney: Renal measurements: 10.6 x 4.8 x 4.2 cm = volume: 112 mL. Normal in size and parenchymal echogenicity. No evidence of mass or hydronephrosis. No peri-transplant fluid collection seen. Color flow in the main renal artery:  Yes Color flow in the main renal vein:  Yes Duplex Doppler Evaluation: Main Renal Artery Velocity: 70 cm/sec Main Renal Artery Resistive Index: 0.81 Venous waveform in main renal vein:  Present Intrarenal resistive index in upper pole:  0.61 (normal 0.6-0.8; equivocal 0.8-0.9; abnormal >= 0.9) Intrarenal resistive index in lower pole: 0.62 (normal 0.6-0.8; equivocal 0.8-0.9; abnormal >= 0.9) Bladder: Normal for degree of bladder distention. Other findings: No free fluid around the transplant kidney or focal fluid collection. IMPRESSION: Unremarkable ultrasound of a transplant kidney in the right pelvis. Electronically Signed   By: Aletta Edouard M.D.   On: 11/03/2020  16:28   MR LIVER WO CONRTAST  Result Date: 11/03/2020 CLINICAL DATA:  History of end-stage renal disease EXAM: MRI ABDOMEN WITHOUT CONTRAST TECHNIQUE: Multiplanar multisequence MR imaging was performed without the administration of intravenous contrast. COMPARISON:  Abdominal ultrasound dated November 01, 2020 FINDINGS: Lower chest: Cardiomegaly. No pericardial effusion. Small bilateral pleural effusions and atelectasis. Hepatobiliary: Marked signal loss is seen throughout the liver on T2 imaging with increased signal on out of phase imaging. Can not evaluate hepatic lesion due to lack of IV contrast. Pancreas: No inflammatory changes or other parenchymal abnormality identified. Spleen:  Normal size.  Diffuse signal loss on T2 imaging. Adrenals/Urinary Tract: Atrophic native kidneys. No evidence of hydronephrosis. T2 hyperintense lesion of the upper pole of the left kidney, likely a simple cyst T1 hyperintense lesion at the mid region of the right kidney, likely a proteinaceous or hemorrhagic cyst, although evaluation is limited due to lack contrast. Right lower quadrant renal transplant with no evidence of hydronephrosis. Stomach/Bowel: Visualized portions within the abdomen are unremarkable. Vascular/Lymphatic: No pathologically enlarged lymph nodes identified. No abdominal aortic aneurysm demonstrated. Other:  Diffuse soft tissue anasarca and trace abdominal ascites. Musculoskeletal: Diffuse signal loss on T2 imaging. No suspicious bone lesions identified. IMPRESSION: Liver lesion which was seen on prior ultrasound is not visualized, likely due to lack of IV contrast. If multiphase contrast-enhanced exam of the liver can not be performed, follow-up ultrasound is recommended in 3-6 months to assess stability. Diffuse wall thickening of the gallbladder, likely secondary to systemic process such as heart failure. If there is clinical concern for acute cholecystitis HIDA scan could be performed. Liver and spleen  demonstrate diffuse signal loss on T2 imaging, compatible with secondary hemochromatosis. Diffuse soft tissue anasarca and trace abdominal ascites. Electronically Signed   By: Yetta Glassman M.D.   On: 11/03/2020 12:30   DG CHEST PORT 1 VIEW  Result Date: 11/10/2020 CLINICAL DATA:  Shortness of breath.  History of renal failure. EXAM: PORTABLE CHEST 1 VIEW COMPARISON:  11/06/2020 FINDINGS: The cardiac silhouette remains enlarged. There are small to moderate bilateral pleural effusions, smaller on the right compared to the prior study but new on the left. There are bibasilar airspace opacities which have increased on the left and improved on the right. No pneumothorax is identified. IMPRESSION: Small to moderate bilateral pleural effusions with bibasilar atelectasis or consolidation, improved on the right and increased on the  left. Electronically Signed   By: Logan Bores M.D.   On: 11/10/2020 06:56   DG Chest Portable 1 View  Result Date: 11/01/2020 CLINICAL DATA:  Pleural effusion EXAM: PORTABLE CHEST 1 VIEW COMPARISON:  10/15/2020, same day CT abdomen pelvis, 08/31/2020 FINDINGS: Gross cardiomegaly. Small left pleural effusion and associated atelectasis or consolidation, not significant changed in appearance compared to prior radiographs dated 08/31. The visualized skeletal structures are unremarkable. IMPRESSION: 1.  Gross cardiomegaly. 2. Small left pleural effusion and associated atelectasis or consolidation, not significantly changed in appearance compared to prior radiographs dated 08/31. Electronically Signed   By: Eddie Candle M.D.   On: 11/01/2020 15:40   US Abdomen Limited RUQ (LIVER/GB)  Result Date: 11/01/2020 CLINICAL DATA:  Abnormality seen on CT imaging. Right upper quadrant pain. EXAM: ULTRASOUND ABDOMEN LIMITED RIGHT UPPER QUADRANT COMPARISON:  CT scan November 01, 2020 FINDINGS: Gallbladder: Gallbladder wall is thickened to 1.1 cm. No stones, sludge, or Murphy's sign. Common bile  duct: Diameter: The common bile duct measures 2 mm on limited imaging/visualization. Liver: A hyperechoic mass is seen in the liver measuring 1.3 x 1.1 x 1.0 cm. No other abnormalities. Portal vein is patent on color Doppler imaging with normal direction of blood flow towards the liver. Other: None. IMPRESSION: 1. The gallbladder wall is thickened to 1.1 cm. No stones, sludge, or Murphy's sign. The gallbladder wall thickening is a nonspecific finding given the absence of other findings. If the clinical picture remains ambiguous, a HIDA scan could further evaluate. 2. Evaluation of the common bile duct is limited but the visualized portion is normal in caliber. 3. The hyperechoic mass in the liver measuring up to 1.3 cm is nonspecific but most likely a hemangioma. This mass is somewhat irregular in shape. Recommend either a CT scan with contrast and delayed imaging for confirmation or an MRI. Electronically Signed   By: Dorise Bullion III M.D.   On: 11/01/2020 16:38     Subjective: Patient seen examined bedside, resting comfortably.  No complaints this morning.  Discharging to SNF today.  Denies headache, no fever/chills/night sweats, no nausea/vomiting/diarrhea, no chest pain, no palpitations, no shortness of breath, no abdominal pain.  No acute events overnight per nursing staff.  Discharge Exam: Vitals:   11/12/20 0456 11/12/20 1136  BP: 129/79 (!) 142/74  Pulse: 75 77  Resp: 18 19  Temp: 97.8 F (36.6 C) 97.9 F (36.6 C)  SpO2: 97% 95%   Vitals:   11/11/20 1204 11/11/20 2013 11/12/20 0456 11/12/20 1136  BP: (!) 144/89 (!) 145/98 129/79 (!) 142/74  Pulse: 73 71 75 77  Resp: $Remo'16 20 18 19  'DcXsD$ Temp: 97.8 F (36.6 C) 97.8 F (36.6 C) 97.8 F (36.6 C) 97.9 F (36.6 C)  TempSrc:  Oral Oral Oral  SpO2: 98% 100% 97% 95%  Weight:      Height:        General: Pt is alert, awake, not in acute distress, chronically ill in appearance Cardiovascular: RRR, S1/S2 +, no rubs, no gallops Respiratory:  CTA bilaterally, no wheezing, no rhonchi, on room air Abdominal: Soft, NT, ND, bowel sounds + Extremities: Trace lower extremity edema, no cyanosis    The results of significant diagnostics from this hospitalization (including imaging, microbiology, ancillary and laboratory) are listed below for reference.     Microbiology: Recent Results (from the past 240 hour(s))  SARS CORONAVIRUS 2 (TAT 6-24 HRS) Nasopharyngeal Nasopharyngeal Swab     Status: None   Collection Time: 11/11/20  12:51 PM   Specimen: Nasopharyngeal Swab  Result Value Ref Range Status   SARS Coronavirus 2 NEGATIVE NEGATIVE Final    Comment: (NOTE) SARS-CoV-2 target nucleic acids are NOT DETECTED.  The SARS-CoV-2 RNA is generally detectable in upper and lower respiratory specimens during the acute phase of infection. Negative results do not preclude SARS-CoV-2 infection, do not rule out co-infections with other pathogens, and should not be used as the sole basis for treatment or other patient management decisions. Negative results must be combined with clinical observations, patient history, and epidemiological information. The expected result is Negative.  Fact Sheet for Patients: SugarRoll.be  Fact Sheet for Healthcare Providers: https://www.woods-mathews.com/  This test is not yet approved or cleared by the [place] FDA and  has been authorized for detection and/or diagnosis of SARS-CoV-2 by FDA under an Emergency Use Authorization (EUA). This EUA will remain  in effect (meaning this test can be used) for the duration of the COVID-19 declaration under Se ction 564(b)(1) of the Act, 21 U.S.C. section 360bbb-3(b)(1), unless the authorization is terminated or revoked sooner.  Performed at [facility], [place] [address], [place], [place] [address]      Labs: BNP (last 3 results) Recent Labs    10/15/20 1811 11/06/20 1246 11/10/20 0223  BNP >4,500.0*  >4,500.0* >8,891.6*   Basic Metabolic Panel: Recent Labs  Lab 11/06/20 0439 11/07/20 0233 11/08/20 0559 11/09/20 0539 11/10/20 0223 11/11/20 0601 11/11/20 1044  NA 132* 132* 133* 132* 134* 133* 133*  K 4.6 4.2 4.3 4.2 4.4 6.1* 4.7  CL 102 103 105 102 103 104 104  CO2 18* 18* 17* 19* 18* 17* 15*  GLUCOSE 90 88 90 91 78 82 75  BUN 82* 76* 77* 74* 75* 80* 79*  CREATININE 4.55* 4.24* 3.79* 3.62* 3.40* 3.53* 3.47*  CALCIUM 9.4 9.0 9.0 9.3 9.3 9.4 9.7  MG 2.5* 2.6*  --  2.5* 2.4 2.5*  --   PHOS 5.5* 4.9* 4.8* 4.5 4.6 4.8*  --    Liver Function Tests: Recent Labs  Lab 11/06/20 0439 11/07/20 0233 11/08/20 0559 11/09/20 0539 11/10/20 0223 11/11/20 0601  AST 29  --   --   --  24 46*  ALT 23  --   --   --  20 19  ALKPHOS 105  --   --   --  93 88  BILITOT 0.9  --   --   --  1.5* 1.1  PROT 6.1*  --   --   --  6.3* 6.0*  ALBUMIN 2.8*  2.9* 2.7* 2.6* 3.0* 2.9* 2.8*   No results for input(s): LIPASE, AMYLASE in the last 168 hours. No results for input(s): AMMONIA in the last 168 hours. CBC: Recent Labs  Lab 11/06/20 0439 11/07/20 0233 11/09/20 0539 11/10/20 0223 11/11/20 0601  WBC 3.4* 2.8* 2.8* 2.3* 2.2*  NEUTROABS  --   --  2.0 1.6* 1.3*  HGB 10.6* 10.5* 11.5* 11.0* 10.8*  HCT 33.8* 32.7* 35.8* 33.1* 33.1*  MCV 92.6 91.9 91.6 89.9 90.4  PLT 86* 77* 69* 71* 65*   Cardiac Enzymes: No results for input(s): CKTOTAL, CKMB, CKMBINDEX, TROPONINI in the last 168 hours. BNP: Invalid input(s): POCBNP CBG: No results for input(s): GLUCAP in the last 168 hours. D-Dimer No results for input(s): DDIMER in the last 72 hours. Hgb A1c No results for input(s): HGBA1C in the last 72 hours. Lipid Profile No results for input(s): CHOL, HDL, LDLCALC, TRIG, CHOLHDL, LDLDIRECT in the  last 72 hours. Thyroid function studies No results for input(s): TSH, T4TOTAL, T3FREE, THYROIDAB in the last 72 hours.  Invalid input(s): FREET3 Anemia work up No results for input(s): VITAMINB12,  FOLATE, FERRITIN, TIBC, IRON, RETICCTPCT in the last 72 hours. Urinalysis    Component Value Date/Time   COLORURINE YELLOW 11/01/2020 2112   APPEARANCEUR CLOUDY (A) 11/01/2020 2112   LABSPEC 1.025 11/01/2020 2112   PHURINE 6.0 11/01/2020 2112   GLUCOSEU NEGATIVE 11/01/2020 2112   HGBUR NEGATIVE 11/01/2020 2112   BILIRUBINUR NEGATIVE 11/01/2020 2112   BILIRUBINUR neg 12/19/2014 [place] 11/01/2020 2112   PROTEINUR 100 (A) 11/01/2020 2112   UROBILINOGEN 0.2 12/19/2014 1157   UROBILINOGEN 0.2 05/23/2014 1301   NITRITE NEGATIVE 11/01/2020 2112   LEUKOCYTESUR MODERATE (A) 11/01/2020 2112   Sepsis Labs Invalid input(s): PROCALCITONIN,  WBC,  LACTICIDVEN Microbiology Recent Results (from the past 240 hour(s))  SARS CORONAVIRUS 2 (TAT 6-24 HRS) Nasopharyngeal Nasopharyngeal Swab     Status: None   Collection Time: 11/11/20 12:51 PM   Specimen: Nasopharyngeal Swab  Result Value Ref Range Status   SARS Coronavirus 2 NEGATIVE NEGATIVE Final    Comment: (NOTE) SARS-CoV-2 target nucleic acids are NOT DETECTED.  The SARS-CoV-2 RNA is generally detectable in upper and lower respiratory specimens during the acute phase of infection. Negative results do not preclude SARS-CoV-2 infection, do not rule out co-infections with other pathogens, and should not be used as the sole basis for treatment or other patient management decisions. Negative results must be combined with clinical observations, patient history, and epidemiological information. The expected result is Negative.  Fact Sheet for Patients: SugarRoll.be  Fact Sheet for Healthcare Providers: https://www.woods-mathews.com/  This test is not yet approved or cleared by the [place] FDA and  has been authorized for detection and/or diagnosis of SARS-CoV-2 by FDA under an Emergency Use Authorization (EUA). This EUA will remain  in effect (meaning this test can be used)  for the duration of the COVID-19 declaration under Se ction 564(b)(1) of the Act, 21 U.S.C. section 360bbb-3(b)(1), unless the authorization is terminated or revoked sooner.  Performed at [facility], [place] [address] [place], [place] [address]      Time coordinating discharge: Over 30 minutes  SIGNED:   Joban Colledge J [place] DO  [facility] 11/12/2020, 11:58 AM

## 2020-11-12 NOTE — Progress Notes (Signed)
Attempt to call report to [facility] nobody answer the phone

## 2020-11-12 NOTE — Progress Notes (Signed)
Occupational Therapy Treatment Patient Details Name: Jenna [name] MRN: [id number] DOB: 11/01/1955 Today's Date: 11/12/2020   History of present illness 65 y/o female presented to ED on 9/17 for constipation, dark stool, and abdominal discomfort (RLQ). CT abdomen pelvis shows mild increased attenuation in fat adjacent to R renal pelvis and gallbladder wall is thickened, fascinating adjacent to the gallbladder. Concern for possible pyelonephritis of transplanted kidney. 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   OT comments  Pt awakened for therapy. Declined ADL training, but sat EOB for UE AROM exercises and energy conservation education. Returned to sidelying at end of session. Pt with decreased activity tolerance. Pt to d/c to SNF for rehab later today.   Recommendations for follow up therapy are one component of a multi-disciplinary discharge planning process, led by the attending physician.  Recommendations may be updated based on patient status, additional functional criteria and insurance authorization.    Follow Up Recommendations  SNF;Supervision/Assistance - 24 hour    Equipment Recommendations  Tub/shower bench    Recommendations for Other Services      Precautions / Restrictions Precautions Precautions: Fall       Mobility Bed Mobility Overal bed mobility: Needs Assistance Bed Mobility: Sidelying to Sit;Sit to Sidelying   Sidelying to sit: Supervision     Sit to sidelying: Supervision General bed mobility comments: increased time    Transfers                 General transfer comment: declined OOB    Balance Overall balance assessment: Needs assistance Sitting-balance support: No upper extremity supported;Feet supported Sitting balance-Leahy Scale: Good                                      ADL either performed or assessed with clinical judgement   ADL                                         General ADL Comments: Educated in energy conservation strategies. Pt declined ADL training, agreeable to UB exercises.     Vision       Perception     Praxis      Cognition Arousal/Alertness: Awake/alert Behavior During Therapy: Flat affect Overall Cognitive Status: No family/caregiver present to determine baseline cognitive functioning                         Following Commands: Follows one step commands with increased time     Problem Solving: Slow processing          Exercises  AROM 10 reps x 2 Shoulder flexion, Abduction, elbow flexion/extension, wrist flexion/extension and digit flexion/extension   Shoulder Instructions       General Comments      Pertinent Vitals/ Pain       Pain Assessment: Faces Faces Pain Scale: No hurt  Home Living                                          Prior Functioning/Environment  Frequency  Min 2X/week        Progress Toward Goals  OT Goals(current goals can now be found in the care plan section)  Progress towards OT goals: Not progressing toward goals - comment (fatigue)  Acute Rehab OT Goals Patient Stated Goal: to get a ground floor apartment OT Goal Formulation: With patient Time For Goal Achievement: 11/20/20 Potential to Achieve Goals: Good  Plan Discharge plan remains appropriate    Co-evaluation                 AM-PAC OT "6 Clicks" Daily Activity     Outcome Measure   Help from another person eating meals?: None Help from another person taking care of personal grooming?: A Little Help from another person toileting, which includes using toliet, bedpan, or urinal?: A Little Help from another person bathing (including washing, rinsing, drying)?: A Little Help from another person to put on and  taking off regular upper body clothing?: A Little Help from another person to put on and taking off regular lower body clothing?: A Little 6 Click Score: 19    End of Session    OT Visit Diagnosis: Unsteadiness on feet (R26.81);Other abnormalities of gait and mobility (R26.89);Muscle weakness (generalized) (M62.81)   Activity Tolerance Patient limited by fatigue   Patient Left with call bell/phone within reach;in bed   Nurse Communication          Time: 7005-2591 OT Time Calculation (min): 12 min  Charges: OT General Charges $OT Visit: 1 Visit OT Treatments $Therapeutic Activity: 8-22 mins  Nestor Lewandowsky, OTR/L Acute Rehabilitation Services Pager: [phone] Office: [phone]   [name] 11/12/2020, 12:40 PM

## 2020-11-13 ENCOUNTER — Emergency Department (HOSPITAL_COMMUNITY): Payer: Medicare Other

## 2020-11-13 ENCOUNTER — Inpatient Hospital Stay (HOSPITAL_COMMUNITY)
Admission: EM | Admit: 2020-11-13 | Discharge: 2020-11-24 | DRG: 291 | Disposition: A | Discharge status: Skilled Nursing Facility | Payer: Medicare Other | Source: Skilled Nursing Facility | Attending: Internal Medicine | Admitting: Internal Medicine

## 2020-11-13 ENCOUNTER — Encounter (HOSPITAL_COMMUNITY): Payer: Self-pay

## 2020-11-13 ENCOUNTER — Other Ambulatory Visit: Payer: Self-pay

## 2020-11-13 ENCOUNTER — Telehealth: Payer: Medicare Other

## 2020-11-13 DIAGNOSIS — N184 Chronic kidney disease, stage 4 (severe): Secondary | ICD-10-CM | POA: Diagnosis present

## 2020-11-13 DIAGNOSIS — D631 Anemia in chronic kidney disease: Secondary | ICD-10-CM | POA: Diagnosis present

## 2020-11-13 DIAGNOSIS — Z20822 Contact with and (suspected) exposure to covid-19: Secondary | ICD-10-CM | POA: Diagnosis present

## 2020-11-13 DIAGNOSIS — E1122 Type 2 diabetes mellitus with diabetic chronic kidney disease: Secondary | ICD-10-CM | POA: Diagnosis present

## 2020-11-13 DIAGNOSIS — Z993 Dependence on wheelchair: Secondary | ICD-10-CM

## 2020-11-13 DIAGNOSIS — G9341 Metabolic encephalopathy: Secondary | ICD-10-CM | POA: Diagnosis present

## 2020-11-13 DIAGNOSIS — I480 Paroxysmal atrial fibrillation: Secondary | ICD-10-CM | POA: Diagnosis present

## 2020-11-13 DIAGNOSIS — R001 Bradycardia, unspecified: Secondary | ICD-10-CM | POA: Diagnosis present

## 2020-11-13 DIAGNOSIS — N289 Disorder of kidney and ureter, unspecified: Secondary | ICD-10-CM

## 2020-11-13 DIAGNOSIS — E8770 Fluid overload, unspecified: Secondary | ICD-10-CM | POA: Diagnosis present

## 2020-11-13 DIAGNOSIS — R0603 Acute respiratory distress: Secondary | ICD-10-CM | POA: Diagnosis present

## 2020-11-13 DIAGNOSIS — I2510 Atherosclerotic heart disease of native coronary artery without angina pectoris: Secondary | ICD-10-CM | POA: Diagnosis not present

## 2020-11-13 DIAGNOSIS — I130 Hypertensive heart and chronic kidney disease with heart failure and stage 1 through stage 4 chronic kidney disease, or unspecified chronic kidney disease: Secondary | ICD-10-CM | POA: Diagnosis not present

## 2020-11-13 DIAGNOSIS — J309 Allergic rhinitis, unspecified: Secondary | ICD-10-CM | POA: Diagnosis present

## 2020-11-13 DIAGNOSIS — D638 Anemia in other chronic diseases classified elsewhere: Secondary | ICD-10-CM | POA: Diagnosis present

## 2020-11-13 DIAGNOSIS — E669 Obesity, unspecified: Secondary | ICD-10-CM | POA: Diagnosis present

## 2020-11-13 DIAGNOSIS — Z6833 Body mass index (BMI) 33.0-33.9, adult: Secondary | ICD-10-CM

## 2020-11-13 DIAGNOSIS — E785 Hyperlipidemia, unspecified: Secondary | ICD-10-CM | POA: Diagnosis present

## 2020-11-13 DIAGNOSIS — R778 Other specified abnormalities of plasma proteins: Secondary | ICD-10-CM | POA: Diagnosis present

## 2020-11-13 DIAGNOSIS — D696 Thrombocytopenia, unspecified: Secondary | ICD-10-CM | POA: Diagnosis present

## 2020-11-13 DIAGNOSIS — Z79621 Long term (current) use of calcineurin inhibitor: Secondary | ICD-10-CM

## 2020-11-13 DIAGNOSIS — I5023 Acute on chronic systolic (congestive) heart failure: Secondary | ICD-10-CM | POA: Diagnosis present

## 2020-11-13 DIAGNOSIS — N179 Acute kidney failure, unspecified: Secondary | ICD-10-CM | POA: Diagnosis present

## 2020-11-13 DIAGNOSIS — F410 Panic disorder [episodic paroxysmal anxiety] without agoraphobia: Secondary | ICD-10-CM | POA: Diagnosis present

## 2020-11-13 DIAGNOSIS — Z880 Allergy status to penicillin: Secondary | ICD-10-CM

## 2020-11-13 DIAGNOSIS — Z7952 Long term (current) use of systemic steroids: Secondary | ICD-10-CM

## 2020-11-13 DIAGNOSIS — I959 Hypotension, unspecified: Secondary | ICD-10-CM | POA: Diagnosis present

## 2020-11-13 DIAGNOSIS — R0600 Dyspnea, unspecified: Secondary | ICD-10-CM

## 2020-11-13 DIAGNOSIS — Z8673 Personal history of transient ischemic attack (TIA), and cerebral infarction without residual deficits: Secondary | ICD-10-CM

## 2020-11-13 DIAGNOSIS — F32A Depression, unspecified: Secondary | ICD-10-CM | POA: Diagnosis present

## 2020-11-13 DIAGNOSIS — N185 Chronic kidney disease, stage 5: Secondary | ICD-10-CM | POA: Diagnosis not present

## 2020-11-13 DIAGNOSIS — Z7982 Long term (current) use of aspirin: Secondary | ICD-10-CM

## 2020-11-13 DIAGNOSIS — N2581 Secondary hyperparathyroidism of renal origin: Secondary | ICD-10-CM | POA: Diagnosis present

## 2020-11-13 DIAGNOSIS — G8194 Hemiplegia, unspecified affecting left nondominant side: Secondary | ICD-10-CM | POA: Diagnosis present

## 2020-11-13 DIAGNOSIS — E1142 Type 2 diabetes mellitus with diabetic polyneuropathy: Secondary | ICD-10-CM | POA: Diagnosis present

## 2020-11-13 DIAGNOSIS — I248 Other forms of acute ischemic heart disease: Secondary | ICD-10-CM | POA: Diagnosis present

## 2020-11-13 DIAGNOSIS — Z79899 Other long term (current) drug therapy: Secondary | ICD-10-CM

## 2020-11-13 DIAGNOSIS — Z7960 Long term (current) use of unspecified immunomodulators and immunosuppressants: Secondary | ICD-10-CM

## 2020-11-13 DIAGNOSIS — I509 Heart failure, unspecified: Secondary | ICD-10-CM | POA: Diagnosis not present

## 2020-11-13 DIAGNOSIS — Z940 Kidney transplant status: Secondary | ICD-10-CM

## 2020-11-13 DIAGNOSIS — J690 Pneumonitis due to inhalation of food and vomit: Secondary | ICD-10-CM | POA: Diagnosis present

## 2020-11-13 DIAGNOSIS — Z91048 Other nonmedicinal substance allergy status: Secondary | ICD-10-CM

## 2020-11-13 DIAGNOSIS — D61818 Other pancytopenia: Secondary | ICD-10-CM | POA: Diagnosis present

## 2020-11-13 DIAGNOSIS — I129 Hypertensive chronic kidney disease with stage 1 through stage 4 chronic kidney disease, or unspecified chronic kidney disease: Secondary | ICD-10-CM | POA: Diagnosis present

## 2020-11-13 DIAGNOSIS — I252 Old myocardial infarction: Secondary | ICD-10-CM

## 2020-11-13 DIAGNOSIS — R109 Unspecified abdominal pain: Secondary | ICD-10-CM

## 2020-11-13 DIAGNOSIS — Z87820 Personal history of traumatic brain injury: Secondary | ICD-10-CM

## 2020-11-13 DIAGNOSIS — Z955 Presence of coronary angioplasty implant and graft: Secondary | ICD-10-CM

## 2020-11-13 DIAGNOSIS — B259 Cytomegaloviral disease, unspecified: Secondary | ICD-10-CM | POA: Diagnosis present

## 2020-11-13 DIAGNOSIS — Z91018 Allergy to other foods: Secondary | ICD-10-CM

## 2020-11-13 DIAGNOSIS — Z888 Allergy status to other drugs, medicaments and biological substances status: Secondary | ICD-10-CM

## 2020-11-13 DIAGNOSIS — E872 Acidosis, unspecified: Secondary | ICD-10-CM | POA: Diagnosis present

## 2020-11-13 DIAGNOSIS — I255 Ischemic cardiomyopathy: Secondary | ICD-10-CM | POA: Diagnosis present

## 2020-11-13 DIAGNOSIS — M329 Systemic lupus erythematosus, unspecified: Secondary | ICD-10-CM | POA: Diagnosis present

## 2020-11-13 DIAGNOSIS — J189 Pneumonia, unspecified organism: Secondary | ICD-10-CM | POA: Diagnosis present

## 2020-11-13 DIAGNOSIS — M3214 Glomerular disease in systemic lupus erythematosus: Secondary | ICD-10-CM | POA: Diagnosis present

## 2020-11-13 LAB — COMPREHENSIVE METABOLIC PANEL
ALT: 18 U/L (ref 0–44)
AST: 32 U/L (ref 15–41)
Albumin: 3.5 g/dL (ref 3.5–5.0)
Alkaline Phosphatase: 139 U/L — ABNORMAL HIGH (ref 38–126)
Anion gap: 11 (ref 5–15)
BUN: 88 mg/dL — ABNORMAL HIGH (ref 8–23)
CO2: 19 mmol/L — ABNORMAL LOW (ref 22–32)
Calcium: 10.4 mg/dL — ABNORMAL HIGH (ref 8.9–10.3)
Chloride: 110 mmol/L (ref 98–111)
Creatinine, Ser: 3.59 mg/dL — ABNORMAL HIGH (ref 0.44–1.00)
GFR, Estimated: 13 mL/min — ABNORMAL LOW (ref >60)
Glucose, Bld: 97 mg/dL (ref 70–99)
Potassium: 4.2 mmol/L (ref 3.5–5.1)
Sodium: 140 mmol/L (ref 135–145)
Total Bilirubin: 1.5 mg/dL — ABNORMAL HIGH (ref 0.3–1.2)
Total Protein: 7.6 g/dL (ref 6.5–8.1)

## 2020-11-13 LAB — CBC WITH DIFFERENTIAL/PLATELET
Abs Immature Granulocytes: 0.27 10*3/uL — ABNORMAL HIGH (ref 0.00–0.07)
Basophils Absolute: 0 10*3/uL (ref 0.0–0.1)
Basophils Relative: 0 %
Eosinophils Absolute: 0 10*3/uL (ref 0.0–0.5)
Eosinophils Relative: 1 %
HCT: 36.2 % (ref 36.0–46.0)
Hemoglobin: 11.7 g/dL — ABNORMAL LOW (ref 12.0–15.0)
Immature Granulocytes: 15 %
Lymphocytes Relative: 16 %
Lymphs Abs: 0.3 10*3/uL — ABNORMAL LOW (ref 0.7–4.0)
MCH: 29.5 pg (ref 26.0–34.0)
MCHC: 32.3 g/dL (ref 30.0–36.0)
MCV: 91.4 fL (ref 80.0–100.0)
Monocytes Absolute: 0.3 10*3/uL (ref 0.1–1.0)
Monocytes Relative: 14 %
Neutro Abs: 1 10*3/uL — ABNORMAL LOW (ref 1.7–7.7)
Neutrophils Relative %: 54 %
Platelets: 86 10*3/uL — ABNORMAL LOW (ref 150–400)
RBC: 3.96 MIL/uL (ref 3.87–5.11)
RDW: 21.8 % — ABNORMAL HIGH (ref 11.5–15.5)
WBC: 1.8 10*3/uL — ABNORMAL LOW (ref 4.0–10.5)
nRBC: 1.6 % — ABNORMAL HIGH (ref 0.0–0.2)

## 2020-11-13 LAB — BRAIN NATRIURETIC PEPTIDE: B Natriuretic Peptide: >4500 pg/mL — ABNORMAL HIGH (ref 0.0–100.0)

## 2020-11-13 LAB — URINALYSIS, ROUTINE W REFLEX MICROSCOPIC
Bacteria, UA: NONE SEEN
Bilirubin Urine: NEGATIVE
Glucose, UA: NEGATIVE mg/dL
Hgb urine dipstick: NEGATIVE
Ketones, ur: 5 mg/dL — AB
Leukocytes,Ua: NEGATIVE
Nitrite: NEGATIVE
Protein, ur: >=300 mg/dL — AB
Specific Gravity, Urine: 1.015 (ref 1.005–1.030)
pH: 5 (ref 5.0–8.0)

## 2020-11-13 LAB — LIPASE, BLOOD: Lipase: 22 U/L (ref 11–51)

## 2020-11-13 LAB — TROPONIN I (HIGH SENSITIVITY)
Troponin I (High Sensitivity): 145 ng/L (ref <18)
Troponin I (High Sensitivity): 145 ng/L (ref <18)
Troponin I (High Sensitivity): 131 ng/L (ref <18)

## 2020-11-13 LAB — I-STAT CHEM 8, ED
BUN: 97 mg/dL — ABNORMAL HIGH (ref 8–23)
Calcium, Ion: 1.2 mmol/L (ref 1.15–1.40)
Chloride: 110 mmol/L (ref 98–111)
Creatinine, Ser: 3.8 mg/dL — ABNORMAL HIGH (ref 0.44–1.00)
Glucose, Bld: 92 mg/dL (ref 70–99)
HCT: 41 % (ref 36.0–46.0)
Hemoglobin: 13.9 g/dL (ref 12.0–15.0)
Potassium: 4.8 mmol/L (ref 3.5–5.1)
Sodium: 136 mmol/L (ref 135–145)
TCO2: 21 mmol/L — ABNORMAL LOW (ref 22–32)

## 2020-11-13 LAB — RESP PANEL BY RT-PCR (FLU A&B, COVID) ARPGX2
Influenza A by PCR: NEGATIVE
Influenza B by PCR: NEGATIVE
SARS Coronavirus 2 by RT PCR: NEGATIVE

## 2020-11-13 MED ORDER — ALBUTEROL SULFATE (2.5 MG/3ML) 0.083% IN NEBU: 2.5000 mg | INHALATION_SOLUTION | RESPIRATORY_TRACT | Status: DC | PRN

## 2020-11-13 MED ORDER — LORAZEPAM 2 MG/ML IJ SOLN
1.0000 mg | Freq: Once | INTRAMUSCULAR | Status: DC
  Filled 2020-11-13: qty 1

## 2020-11-13 MED ORDER — ACETAMINOPHEN 650 MG RE SUPP: 650.0000 mg | Freq: Four times a day (QID) | RECTAL | Status: DC | PRN

## 2020-11-13 MED ORDER — MORPHINE SULFATE (PF) 4 MG/ML IV SOLN
4.0000 mg | Freq: Once | INTRAVENOUS | Status: AC
  Administered 2020-11-13: 4 mg via INTRAVENOUS
  Filled 2020-11-13: qty 1

## 2020-11-13 MED ORDER — PANTOPRAZOLE SODIUM 40 MG PO TBEC
40.0000 mg | DELAYED_RELEASE_TABLET | Freq: Two times a day (BID) | ORAL | Status: DC
  Administered 2020-11-14 – 2020-11-24 (×22): 40 mg via ORAL
  Filled 2020-11-13 (×22): qty 1

## 2020-11-13 MED ORDER — CITALOPRAM HYDROBROMIDE 20 MG PO TABS
10.0000 mg | ORAL_TABLET | Freq: Every day | ORAL | Status: DC
  Administered 2020-11-14 – 2020-11-24 (×9): 10 mg via ORAL
  Filled 2020-11-13 (×11): qty 1

## 2020-11-13 MED ORDER — ALPRAZOLAM 0.25 MG PO TABS
0.2500 mg | ORAL_TABLET | Freq: Every evening | ORAL | Status: DC | PRN
  Administered 2020-11-16 – 2020-11-23 (×6): 0.25 mg via ORAL
  Filled 2020-11-13 (×6): qty 1

## 2020-11-13 MED ORDER — ACETAMINOPHEN 325 MG PO TABS
650.0000 mg | ORAL_TABLET | Freq: Four times a day (QID) | ORAL | Status: DC | PRN
  Administered 2020-11-14 – 2020-11-16 (×3): 650 mg via ORAL
  Filled 2020-11-13 (×4): qty 2

## 2020-11-13 MED ORDER — SODIUM CHLORIDE 0.9 % IV SOLN
500.0000 mg | INTRAVENOUS | Status: DC
  Administered 2020-11-14: 500 mg via INTRAVENOUS
  Filled 2020-11-13: qty 500

## 2020-11-13 MED ORDER — SODIUM CHLORIDE 0.9 % IV SOLN: 250.0000 mL | INTRAVENOUS | Status: DC | PRN

## 2020-11-13 MED ORDER — FUROSEMIDE 10 MG/ML IJ SOLN
60.0000 mg | Freq: Two times a day (BID) | INTRAMUSCULAR | Status: DC
  Administered 2020-11-14 – 2020-11-16 (×5): 60 mg via INTRAVENOUS
  Filled 2020-11-13 (×5): qty 6

## 2020-11-13 MED ORDER — SODIUM BICARBONATE 650 MG PO TABS
650.0000 mg | ORAL_TABLET | Freq: Three times a day (TID) | ORAL | Status: DC
  Administered 2020-11-14 – 2020-11-24 (×26): 650 mg via ORAL
  Filled 2020-11-13 (×30): qty 1

## 2020-11-13 MED ORDER — LORAZEPAM 2 MG/ML IJ SOLN
2.0000 mg | Freq: Once | INTRAMUSCULAR | Status: AC
  Administered 2020-11-13: 2 mg via INTRAVENOUS

## 2020-11-13 MED ORDER — TACROLIMUS 0.5 MG PO CAPS
0.5000 mg | ORAL_CAPSULE | Freq: Every day | ORAL | Status: DC
  Filled 2020-11-13: qty 1

## 2020-11-13 MED ORDER — HYDROCODONE-ACETAMINOPHEN 5-325 MG PO TABS: 1.0000 | ORAL_TABLET | ORAL | Status: DC | PRN

## 2020-11-13 MED ORDER — ISOSORB DINITRATE-HYDRALAZINE 20-37.5 MG PO TABS
1.0000 | ORAL_TABLET | Freq: Three times a day (TID) | ORAL | Status: DC
  Administered 2020-11-14 – 2020-11-18 (×15): 1 via ORAL
  Filled 2020-11-13 (×16): qty 1

## 2020-11-13 MED ORDER — VALGANCICLOVIR HCL 450 MG PO TABS
450.0000 mg | ORAL_TABLET | ORAL | Status: DC
  Administered 2020-11-15 – 2020-11-23 (×5): 450 mg via ORAL
  Filled 2020-11-13 (×5): qty 1

## 2020-11-13 MED ORDER — FUROSEMIDE 10 MG/ML IJ SOLN
40.0000 mg | Freq: Once | INTRAMUSCULAR | Status: AC
  Administered 2020-11-13: 40 mg via INTRAVENOUS
  Filled 2020-11-13: qty 4

## 2020-11-13 MED ORDER — SODIUM CHLORIDE 0.9% FLUSH: 3.0000 mL | INTRAVENOUS | Status: DC | PRN

## 2020-11-13 MED ORDER — ASPIRIN EC 81 MG PO TBEC
81.0000 mg | DELAYED_RELEASE_TABLET | Freq: Every morning | ORAL | Status: DC
  Administered 2020-11-14 – 2020-11-24 (×10): 81 mg via ORAL
  Filled 2020-11-13 (×9): qty 1

## 2020-11-13 MED ORDER — HYDROXYCHLOROQUINE SULFATE 200 MG PO TABS
200.0000 mg | ORAL_TABLET | Freq: Two times a day (BID) | ORAL | Status: DC
  Administered 2020-11-14 – 2020-11-17 (×9): 200 mg via ORAL
  Filled 2020-11-13 (×11): qty 1

## 2020-11-13 MED ORDER — GADOBUTROL 1 MMOL/ML IV SOLN
10.0000 mL | Freq: Once | INTRAVENOUS | Status: AC | PRN
  Administered 2020-11-13: 10 mL via INTRAVENOUS

## 2020-11-13 MED ORDER — SODIUM CHLORIDE 0.9 % IV SOLN
2.0000 g | INTRAVENOUS | Status: DC
  Administered 2020-11-14: 2 g via INTRAVENOUS
  Filled 2020-11-13: qty 20

## 2020-11-13 MED ORDER — GABAPENTIN 100 MG PO CAPS: 100.0000 mg | ORAL_CAPSULE | Freq: Every day | ORAL | Status: DC

## 2020-11-13 MED ORDER — SODIUM CHLORIDE 0.9% FLUSH
3.0000 mL | Freq: Two times a day (BID) | INTRAVENOUS | Status: DC
  Administered 2020-11-14 – 2020-11-23 (×17): 3 mL via INTRAVENOUS

## 2020-11-13 NOTE — Telephone Encounter (Signed)
Transition Care Management Unsuccessful Follow-up Telephone Call  Date of discharge and from where:  Jenna [name] 11/12/2020  Attempts:  2nd Attempt  Reason for unsuccessful TCM follow-up call:  Unable to reach patient

## 2020-11-13 NOTE — ED Notes (Signed)
Patient transported to MRI 

## 2020-11-13 NOTE — ED Notes (Signed)
Admitting Dr requested to see pt before pt go up. RN informed

## 2020-11-13 NOTE — ED Notes (Signed)
ED TO INPATIENT HANDOFF REPORT  Name/Age/Gender Jenna [name] 65 y.o. female  Code Status Code Status History     Date Active Date Inactive Code Status Order ID Comments User Context   11/01/2020 2318 11/12/2020 1857 Full Code [id number]  Lenore Cordia, MD ED   08/21/2020 1918 08/27/2020 0150 Full Code [id number]  Orma Flaming, MD ED   07/11/2020 0827 07/13/2020 0416 Full Code [id number]  Norval Morton, MD ED   06/27/2017 2315 07/06/2017 0152 Full Code [id number]  Elwyn Reach, MD Inpatient   05/05/2013 0102 05/06/2013 1756 Full Code [id number]  Berle Mull, MD ED      Questions for Most Recent Historical Code Status (Order [id number])        Home/SNF/Other Home  Chief Complaint Fluid overload [E87.70]  Level of Care/Admitting Diagnosis ED Disposition     ED Disposition  Admit   Condition  --   Comment  Hospital Area: [place] [[id number]]  Level of Care: Progressive [102]  Admit to Progressive based on following criteria: CARDIOVASCULAR & THORACIC of moderate stability with acute coronary syndrome symptoms/low risk myocardial infarction/hypertensive urgency/arrhythmias/heart failure potentially compromising stability and stable post cardiovascular intervention patients.  May place patient in observation at [facility] or [place] if equivalent level of care is available:: No  Covid Evaluation: Confirmed COVID Negative  Diagnosis: Fluid overload [356861]  Admitting Physician: Toy Baker [[id number]]  Attending Physician: Toy Baker [[id number]]          Medical History Past Medical History:  Diagnosis Date   Anemia    Anxiety    panic attack- talks herself and takes deep breathes   CAD (coronary artery disease)    a. STEMI 10/2018 s/p DES to LAD.   Depression    Dyspnea    with exertion    ESRD (end stage renal disease) ([place])    hemo TTHSAT   Essential hypertension    FSGS (focal segmental glomerulosclerosis) 2011   By renal  biopsy   Head injury    age 89   HFrEF (heart failure with reduced ejection fraction) ([employer])    History of kidney stones    kidney stone   Hx of lupus nephritis 2011   by renal biopsy   Ischemic cardiomyopathy    Kidney transplant recipient    LA thrombus 10/2018   Lupus (systemic lupus erythematosus) ([place])    followed by Dr. 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 remote parietal infarct on brain MRI 12/2019.   Pericarditis    age 48ish   Renal stone    Secondary hyperparathyroidism of renal origin ([place])    SLE (systemic lupus erythematosus) ([name])    TIA (transient ischemic attack)    no residual effects    Allergies Allergies  Allergen Reactions   Enalapril Maleate Anaphylaxis, Swelling and Other (See Comments)    Throat swells   Penicillins Other (See Comments)    Made patient lightheaded PATIENT HAS HAD A PCN REACTION WITH IMMEDIATE RASH, FACIAL/TONGUE/THROAT SWELLING, SOB, OR LIGHTHEADEDNESS WITH HYPOTENSION:  #  #  YES  #  #  Has patient had a PCN reaction causing severe rash involving mucus membranes or skin necrosis: No Has patient had a PCN reaction that required hospitalization: No Has patient had a PCN reaction occurring within the last 10 years: No If all of the above answers  are "NO", then may proceed with Cephalosporin use.    Chocolate Nausea And Vomiting   Tape Itching, Rash and Other (See Comments)    IV Location/Drains/Wounds Patient Lines/Drains/Airways Status     Active Line/Drains/Airways     Name Placement date Placement time Site Days   Peripheral IV 11/13/20 20 G Right Antecubital 11/13/20  1507  Antecubital  less than 1   Fistula / Graft Left Forearm Arteriovenous fistula 07/04/17  1354  Forearm  1228   Fistula / Graft Left Upper arm 11/30/17  0800  Upper arm  1079   Incision (Closed) 07/04/17 Arm Left 07/04/17  1252  -- 1228   Incision (Closed)  11/30/17 Arm Left 11/30/17  0931  -- 1079            Labs/Imaging Results for orders placed or performed during the hospital encounter of 11/13/20 (from the past 48 hour(s))  Urinalysis, Routine w reflex microscopic     Status: Abnormal   Collection Time: 11/13/20  2:10 PM  Result Value Ref Range   Color, Urine YELLOW YELLOW   APPearance CLEAR CLEAR   Specific Gravity, Urine 1.015 1.005 - 1.030   pH 5.0 5.0 - 8.0   Glucose, UA NEGATIVE NEGATIVE mg/dL   Hgb urine dipstick NEGATIVE NEGATIVE   Bilirubin Urine NEGATIVE NEGATIVE   Ketones, ur 5 (A) NEGATIVE mg/dL   Protein, ur >=300 (A) NEGATIVE mg/dL   Nitrite NEGATIVE NEGATIVE   Leukocytes,Ua NEGATIVE NEGATIVE   RBC / HPF 0-5 0 - 5 RBC/hpf   WBC, UA 0-5 0 - 5 WBC/hpf   Bacteria, UA NONE SEEN NONE SEEN   Squamous Epithelial / LPF 0-5 0 - 5   Mucus PRESENT    Hyaline Casts, UA PRESENT     Comment: Performed at [facility], [place] [address] [place], [place] [address]  Comprehensive metabolic panel     Status: None   Collection Time: 11/13/20  2:42 PM  Result Value Ref Range   Sodium  135 - 145 mmol/L    SPECIMEN HEMOLYZED. HEMOLYSIS MAY AFFECT INTEGRITY OF RESULTS.    Comment: INFORMED LISA IN ED AT 1544 11/13/20 MULLINS,T SEE RECOLLECT [id number]    Potassium  3.5 - 5.1 mmol/L    SPECIMEN HEMOLYZED. HEMOLYSIS MAY AFFECT INTEGRITY OF RESULTS.    Comment: INFORMED LISA IN ED AT 1544 11/13/20 MULLINS,T SEE RECOLLECT [id number]    Chloride  98 - 111 mmol/L    SPECIMEN HEMOLYZED. HEMOLYSIS MAY AFFECT INTEGRITY OF RESULTS.    Comment: INFORMED LISA IN ED AT 1544 11/13/20 MULLINS,T SEE RECOLLECT [id number]    CO2  22 - 32 mmol/L    SPECIMEN HEMOLYZED. HEMOLYSIS MAY AFFECT INTEGRITY OF RESULTS.    Comment: INFORMED LISA IN ED AT 2924 11/13/20 MULLINS,T SEE RECOLLECT [id number]    Glucose, Bld  70 - 99 mg/dL    SPECIMEN HEMOLYZED. HEMOLYSIS MAY AFFECT INTEGRITY OF RESULTS.    Comment: INFORMED LISA IN ED AT 1544 11/13/20  MULLINS,T SEE RECOLLECT [id number]    BUN  8 - 23 mg/dL    SPECIMEN HEMOLYZED. HEMOLYSIS MAY AFFECT INTEGRITY OF RESULTS.    Comment: INFORMED LISA IN ED AT 1544 11/13/20 MULLINS,T SEE RECOLLECT [id number]    Creatinine, Ser  0.44 - 1.00 mg/dL    SPECIMEN HEMOLYZED. HEMOLYSIS MAY AFFECT INTEGRITY OF RESULTS.    Comment: INFORMED LISA IN ED AT 1544 11/13/20 MULLINS,T SEE RECOLLECT [id number]    Calcium  8.9 - 10.3 mg/dL  SPECIMEN HEMOLYZED. HEMOLYSIS MAY AFFECT INTEGRITY OF RESULTS.    Comment: INFORMED LISA IN ED AT 1544 11/13/20 MULLINS,T SEE RECOLLECT [id number]    Total Protein  6.5 - 8.1 g/dL    SPECIMEN HEMOLYZED. HEMOLYSIS MAY AFFECT INTEGRITY OF RESULTS.    Comment: INFORMED LISA IN ED AT 1544 11/13/20 MULLINS,T SEE RECOLLECT [id number]    Albumin  3.5 - 5.0 g/dL    SPECIMEN HEMOLYZED. HEMOLYSIS MAY AFFECT INTEGRITY OF RESULTS.    Comment: INFORMED LISA IN ED AT 1544 11/13/20 MULLINS,T SEE RECOLLECT [id number]    AST  15 - 41 U/L    SPECIMEN HEMOLYZED. HEMOLYSIS MAY AFFECT INTEGRITY OF RESULTS.    Comment: INFORMED LISA IN ED AT 5038 11/13/20 MULLINS,T SEE RECOLLECT [id number]    ALT  0 - 44 U/L    SPECIMEN HEMOLYZED. HEMOLYSIS MAY AFFECT INTEGRITY OF RESULTS.    Comment: INFORMED LISA IN ED AT 1544 11/13/20 MULLINS,T SEE RECOLLECT [id number]    Alkaline Phosphatase  38 - 126 U/L    SPECIMEN HEMOLYZED. HEMOLYSIS MAY AFFECT INTEGRITY OF RESULTS.    Comment: INFORMED LISA IN ED AT 1544 11/13/20 MULLINS,T SEE RECOLLECT [id number]    Total Bilirubin  0.3 - 1.2 mg/dL    SPECIMEN HEMOLYZED. HEMOLYSIS MAY AFFECT INTEGRITY OF RESULTS.    Comment: INFORMED LISA IN ED AT 7948 11/13/20 MULLINS,T SEE RECOLLECT [id number]    GFR, Estimated  >60 mL/min    SPECIMEN HEMOLYZED. HEMOLYSIS MAY AFFECT INTEGRITY OF RESULTS.    Comment: INFORMED LISA IN ED AT 1544 11/13/20 MULLINS,T SEE RECOLLECT [id number]    GFR calc Af Amer  >60 mL/min    SPECIMEN HEMOLYZED. HEMOLYSIS MAY AFFECT INTEGRITY OF RESULTS.    Comment:  INFORMED LISA IN ED AT 1544 11/13/20 MULLINS,T SEE RECOLLECT [id number]    Anion gap  5 - 15    SPECIMEN HEMOLYZED. HEMOLYSIS MAY AFFECT INTEGRITY OF RESULTS.    CommentTia Alert IN ED AT 1544 11/13/20 MULLINS,T SEE RECOLLECT [id number] Performed at [facility], [place] [address], [place], [place] [address]   CBC with Differential     Status: Abnormal   Collection Time: 11/13/20  2:42 PM  Result Value Ref Range   WBC 1.8 (L) 4.0 - 10.5 K/uL   RBC 3.96 3.87 - 5.11 MIL/uL   Hemoglobin 11.7 (L) 12.0 - 15.0 g/dL   HCT 36.2 36.0 - 46.0 %   MCV 91.4 80.0 - 100.0 fL   MCH 29.5 26.0 - 34.0 pg   MCHC 32.3 30.0 - 36.0 g/dL   RDW 21.8 (H) 11.5 - 15.5 %   Platelets 86 (L) 150 - 400 K/uL    Comment: SPECIMEN CHECKED FOR CLOTS Immature Platelet Fraction may be clinically indicated, consider ordering this additional test [id number] REPEATED TO VERIFY PLATELET COUNT CONFIRMED BY SMEAR    nRBC 1.6 (H) 0.0 - 0.2 %   Neutrophils Relative % 54 %   Neutro Abs 1.0 (L) 1.7 - 7.7 K/uL   Lymphocytes Relative 16 %   Lymphs Abs 0.3 (L) 0.7 - 4.0 K/uL   Monocytes Relative 14 %   Monocytes Absolute 0.3 0.1 - 1.0 K/uL   Eosinophils Relative 1 %   Eosinophils Absolute 0.0 0.0 - 0.5 K/uL   Basophils Relative 0 %   Basophils Absolute 0.0 0.0 - 0.1 K/uL   Immature Granulocytes 15 %   Abs Immature Granulocytes 0.27 (H) 0.00 - 0.07 K/uL   Schistocytes PRESENT    Burr Cells PRESENT  Polychromasia PRESENT    Ovalocytes PRESENT     Comment: Performed at [facility], [address], [place], [place] [address]  Troponin I (High Sensitivity)     Status: Abnormal   Collection Time: 11/13/20  2:42 PM  Result Value Ref Range   Troponin I (High Sensitivity) 145 (HH) <18 ng/L    Comment: CRITICAL RESULT CALLED TO, READ BACK BY AND VERIFIED WITH: CONRAD,Q. RN AT 1548 11/13/20 MULLINS,T (NOTE) Elevated high sensitivity troponin I (hsTnI) values and significant  changes across  serial measurements may suggest ACS but many other  chronic and acute conditions are known to elevate hsTnI results.  Refer to the Links section for chest pain algorithms and additional  guidance. Performed at [facility], [address], [place], [place] [address]   Brain natriuretic peptide     Status: Abnormal   Collection Time: 11/13/20  2:42 PM  Result Value Ref Range   B Natriuretic Peptide >4,500.0 (H) 0.0 - 100.0 pg/mL    Comment: Performed at [facility], [address] [place], [place] [address]  Comprehensive metabolic panel     Status: Abnormal   Collection Time: 11/13/20  4:12 PM  Result Value Ref Range   Sodium 140 135 - 145 mmol/L   Potassium 4.2 3.5 - 5.1 mmol/L   Chloride 110 98 - 111 mmol/L   CO2 19 (L) 22 - 32 mmol/L   Glucose, Bld 97 70 - 99 mg/dL    Comment: Glucose reference range applies only to samples taken after fasting for at least 8 hours.   BUN 88 (H) 8 - 23 mg/dL   Creatinine, Ser 2.44 (H) 0.44 - 1.00 mg/dL   Calcium 54.9 (H) 8.9 - 10.3 mg/dL   Total Protein 7.6 6.5 - 8.1 g/dL   Albumin 3.5 3.5 - 5.0 g/dL   AST 32 15 - 41 U/L   ALT 18 0 - 44 U/L   Alkaline Phosphatase 139 (H) 38 - 126 U/L   Total Bilirubin 1.5 (H) 0.3 - 1.2 mg/dL   GFR, Estimated 13 (L) >60 mL/min    Comment: (NOTE) Calculated using the CKD-EPI Creatinine Equation (2021)    Anion gap 11 5 - 15    Comment: Performed at [facility], [address] [place], [place] [address]  Lipase, blood     Status: None   Collection Time: 11/13/20  4:12 PM  Result Value Ref Range   Lipase 22 11 - 51 U/L    Comment: Performed at [facility], [address], [place], [place] [address]  Troponin I (High Sensitivity)     Status: Abnormal   Collection Time: 11/13/20  4:12 PM  Result Value Ref Range   Troponin I (High Sensitivity) 145 (HH) <18 ng/L    Comment: CRITICAL VALUE NOTED.  VALUE IS CONSISTENT WITH PREVIOUSLY  REPORTED AND CALLED VALUE. (NOTE) Elevated high sensitivity troponin I (hsTnI) values and significant  changes across serial measurements may suggest ACS but many other  chronic and acute conditions are known to elevate hsTnI results.  Refer to the Links section for chest pain algorithms and additional  guidance. Performed at [facility], [address] [place] [address], [place], [place] [address]   I-stat chem 8, ED (not at [facility] or [facility])     Status: Abnormal   Collection Time: 11/13/20  4:23 PM  Result Value Ref Range   Sodium 136 135 - 145 mmol/L  Potassium 4.8 3.5 - 5.1 mmol/L   Chloride 110 98 - 111 mmol/L   BUN 97 (H) 8 - 23 mg/dL   Creatinine, Ser 3.80 (H) 0.44 - 1.00 mg/dL   Glucose, Bld 92 70 - 99 mg/dL    Comment: Glucose reference range applies only to samples taken after fasting for at least 8 hours.   Calcium, Ion 1.20 1.15 - 1.40 mmol/L   TCO2 21 (L) 22 - 32 mmol/L   Hemoglobin 13.9 12.0 - 15.0 g/dL   HCT 41.0 36.0 - 46.0 %  Resp Panel by RT-PCR (Flu A&B, Covid) Nasopharyngeal Swab     Status: None   Collection Time: 11/13/20  5:49 PM   Specimen: Nasopharyngeal Swab; Nasopharyngeal(NP) swabs in vial transport medium  Result Value Ref Range   SARS Coronavirus 2 by RT PCR NEGATIVE NEGATIVE    Comment: (NOTE) SARS-CoV-2 target nucleic acids are NOT DETECTED.  The SARS-CoV-2 RNA is generally detectable in upper respiratory specimens during the acute phase of infection. The lowest concentration of SARS-CoV-2 viral copies this assay can detect is 138 copies/mL. A negative result does not preclude SARS-Cov-2 infection and should not be used as the sole basis for treatment or other patient management decisions. A negative result may occur with  improper specimen collection/handling, submission of specimen other than nasopharyngeal swab, presence of viral mutation(s) within the areas targeted by this assay, and inadequate number of viral copies(<138 copies/mL). A  negative result must be combined with clinical observations, patient history, and epidemiological information. The expected result is Negative.  Fact Sheet for Patients:  EntrepreneurPulse.com.au  Fact Sheet for Healthcare Providers:  IncredibleEmployment.be  This test is no t yet approved or cleared by the [place] FDA and  has been authorized for detection and/or diagnosis of SARS-CoV-2 by FDA under an Emergency Use Authorization (EUA). This EUA will remain  in effect (meaning this test can be used) for the duration of the COVID-19 declaration under Section 564(b)(1) of the Act, 21 U.S.C.section 360bbb-3(b)(1), unless the authorization is terminated  or revoked sooner.       Influenza A by PCR NEGATIVE NEGATIVE   Influenza B by PCR NEGATIVE NEGATIVE    Comment: (NOTE) The Xpert Xpress SARS-CoV-2/FLU/RSV plus assay is intended as an aid in the diagnosis of influenza from Nasopharyngeal swab specimens and should not be used as a sole basis for treatment. Nasal washings and aspirates are unacceptable for Xpert Xpress SARS-CoV-2/FLU/RSV testing.  Fact Sheet for Patients: EntrepreneurPulse.com.au  Fact Sheet for Healthcare Providers: IncredibleEmployment.be  This test is not yet approved or cleared by the [place] FDA and has been authorized for detection and/or diagnosis of SARS-CoV-2 by FDA under an Emergency Use Authorization (EUA). This EUA will remain in effect (meaning this test can be used) for the duration of the COVID-19 declaration under Section 564(b)(1) of the Act, 21 U.S.C. section 360bbb-3(b)(1), unless the authorization is terminated or revoked.  Performed at [facility], [place] [address], [place], [place] [address]   Troponin I (High Sensitivity)     Status: Abnormal   Collection Time: 11/13/20  6:45 PM  Result Value Ref Range   Troponin I (High Sensitivity)  131 (HH) <18 ng/L    Comment: CRITICAL VALUE NOTED.  VALUE IS CONSISTENT WITH PREVIOUSLY REPORTED AND CALLED VALUE. (NOTE) Elevated high sensitivity troponin I (hsTnI) values and significant  changes across serial measurements may suggest ACS but many other  chronic and acute conditions are known to elevate hsTnI  results.  Refer to the Links section for chest pain algorithms and additional  guidance. Performed at [facility], [place] [address], [place], [place] [address]    DG Chest 2 View  Result Date: 11/13/2020 CLINICAL DATA:  Chest pain and shortness of breath. Bilateral leg pain and swelling. EXAM: CHEST - 2 VIEW COMPARISON:  11/10/2020 FINDINGS: Persistent airspace opacities in the right middle lobe both lower lobes. The airspace opacity in the left lower lobe and in the right middle lobe is mildly worsened, with a reduced amount of aeration in the involved regions. There is at least mild enlargement of the cardiopericardial silhouette. Mild prominence of upper zone pulmonary vasculature may reflect pulmonary venous hypertension. Linear subsegmental atelectasis in the left mid lung appears stable. IMPRESSION: 1. Worsened bibasilar airspace opacities, suspicious for pneumonia or aspiration. 2. Cardiomegaly with pulmonary venous hypertension. Electronically Signed   By: Van Clines M.D.   On: 11/13/2020 14:54   CT HEAD WO CONTRAST (5MM)  Result Date: 11/13/2020 CLINICAL DATA:  Dizziness Left-sided weakness EXAM: CT HEAD WITHOUT CONTRAST TECHNIQUE: Contiguous axial images were obtained from the base of the skull through the vertex without intravenous contrast. COMPARISON:  None. FINDINGS: Brain: No acute intracranial hemorrhage. No midline shift or hydrocephalus. Interval development of low-density in the right occipital lobe white matter. Vascular: No hyperdense vessel or unexpected calcification. Skull: Normal. Negative for fracture or focal lesion. Sinuses/Orbits: No  acute finding. Other: None. IMPRESSION: 1. No acute intracranial hemorrhage. 2. Interval development of white matter hypodensity in the right occipital lobe. While this may be due to interval infarct, findings may also be the result of vasogenic edema related to underlying mass. Further evaluation with contrast-enhanced MRI of the brain should be performed when patient condition allows. Electronically Signed   By: Miachel Roux M.D.   On: 11/13/2020 15:51   MR Brain W and Wo Contrast  Result Date: 11/13/2020 CLINICAL DATA:  Dizziness and possible brain mass EXAM: MRI HEAD WITHOUT AND WITH CONTRAST TECHNIQUE: Multiplanar, multiecho pulse sequences of the brain and surrounding structures were obtained without and with intravenous contrast. CONTRAST:  26mL GADAVIST GADOBUTROL 1 MMOL/ML IV SOLN COMPARISON:  Head CT 11/13/2020 Brain MRI 05/05/2013 FINDINGS: Brain: No acute infarct, mass effect or extra-axial collection. No acute or chronic hemorrhage. There is hyperintense T2-weighted signal in the posterior right parietal lobe, likely an old infarct. Mild periventricular white matter hyperintense T2-weighted signal. The midline structures are normal. Vascular: Major flow voids are preserved. Skull and upper cervical spine: Normal calvarium and skull base. Visualized upper cervical spine and soft tissues are normal. Sinuses/Orbits:No paranasal sinus fluid levels or advanced mucosal thickening. No mastoid or middle ear effusion. Normal orbits. IMPRESSION: 1. No acute intracranial abnormality.  No mass lesion. 2. 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 Electronically Signed   By: Ulyses Jarred M.D.   On: 11/13/2020 21:25   CT Renal Stone Study  Result Date: 11/13/2020 CLINICAL DATA:  Bilateral leg pain and swelling that began 2-3 hours, abdominal pain, nausea EXAM: CT ABDOMEN AND PELVIS WITHOUT CONTRAST TECHNIQUE: Multidetector CT imaging of the abdomen and pelvis was  performed following the standard protocol without IV contrast. COMPARISON:  CT abdomen/pelvis 11/01/2020, MR abdomen 11/03/2020 FINDINGS: Lower chest: There are bilateral pleural effusions, increased since 11/01/2020. Patchy opacities in the left lower lobe are increased since the prior study are suspicious for infection or aspiration. The heart is enlarged. Coronary artery calcifications are again seen.  Hepatobiliary: The liver is unremarkable, within the confines of noncontrast technique. The previously reported abnormality seen on the abdominal ultrasound from 11/01/2020 is not identified. The gallbladder is contracted but again demonstrates a thickened wall and mild pericholecystic fat stranding. Pancreas: Unremarkable. Spleen: Unremarkable. Adrenals/Urinary Tract: The adrenals are unremarkable. The native kidneys are atrophic, with no hydronephrosis or hydroureter. A transplant kidney is again seen in the right lower quadrant. There is prominence of the transplant ureter and pelvis with mild stranding in the surrounding fat. There are no focal lesions or stones seen in the transplant kidney or ureter. Overall, the findings are similar to the study from 11/01/2020. The bladder is decompressed but grossly unremarkable. Stomach/Bowel: The stomach is unremarkable. There is no evidence of bowel obstruction. There is no evidence of abnormal bowel wall thickening or inflammatory change. There is fluid in the distal colon/rectum. There are scattered colonic diverticula without evidence of acute diverticulitis. Vascular/Lymphatic: There is calcified atherosclerotic plaque throughout the nonaneurysmal abdominal aorta. There is no abdominal or pelvic lymphadenopathy. Reproductive: A calcification in the uterus likely reflects a fibroid. There is no adnexal mass. Other: There is trace free fluid in the pelvis, nonspecific and not significantly changed. There is no free air. There is diffuse body wall edema, similar to the  prior study. Musculoskeletal: There is no acute osseous abnormality or aggressive osseous lesion. IMPRESSION: 1. Overall, no new acute finding in the abdomen or pelvis compared to the study from 11/01/2020. 2. Similar appearance of the transplant kidney in the right lower quadrant with unchanged prominence of the pelvis and ureter with no stone or other obstructing lesion seen. There is mild stranding in the surrounding fat, also similar to the prior study. Correlate with symptoms and laboratory values if there is concern for pyelonephritis. 3. The gallbladder again demonstrates mild wall thickening and pericholecystic fat stranding. This is again nonspecific and could be due to heart failure. This could be evaluated with HIDA scan if there is clinical concern for acute cholecystitis. 4. Bilateral pleural effusions, increased since 11/01/2020, with increased patchy opacities in the left base suspicious for infection or aspiration. 5. Fluid in the colon which can be seen in the setting of diarrhea. 6. Diffuse body wall edema and trace free fluid in the pelvis, similar to the prior study. 7. Unchanged cardiomegaly. 8.  Aortic Atherosclerosis (ICD10-I70.0). Electronically Signed   By: Valetta Mole M.D.   On: 11/13/2020 15:59    Pending Labs Unresulted Labs (From admission, onward)     Start     Ordered   11/13/20 2233  Culture, blood (Routine X 2) w Reflex to ID Panel  BLOOD CULTURE X 2,   R (with TIMED occurrences)      11/13/20 2232   11/13/20 2228  HIV Antibody (routine testing w rflx)  (HIV Antibody (Routine testing w reflex) panel)  Once,   R        11/13/20 2230   11/13/20 2228  Legionella Pneumophila Serogp 1 Ur Ag  Once,   R        11/13/20 2230   11/13/20 2228  Strep pneumoniae urinary antigen  Once,   R        11/13/20 2230   11/13/20 2228  Expectorated Sputum Assessment w Gram Stain, Rflx to Resp Cult  Once,   R        11/13/20 2230   11/13/20 2227  Magnesium  Add-on,   AD        11/13/20  2226   11/13/20 2227  Phosphorus  Add-on,   AD        11/13/20 2226            Vitals/Pain Today's Vitals   11/13/20 1830 11/13/20 1845 11/13/20 1945 11/13/20 2147  BP: 113/70 123/68 123/74 120/77  Pulse:  (!) 55 80 (!) 48  Resp:  $Remo'17 20 20  'otetj$ Temp:      TempSrc:      SpO2:  97% 99% 99%  Weight:      Height:      PainSc:        Isolation Precautions No active isolations  Medications Medications  cefTRIAXone (ROCEPHIN) 2 g in sodium chloride 0.9 % 100 mL IVPB (has no administration in time range)  azithromycin (ZITHROMAX) 500 mg in sodium chloride 0.9 % 250 mL IVPB (has no administration in time range)  morphine 4 MG/ML injection 4 mg (4 mg Intravenous Given 11/13/20 1543)  furosemide (LASIX) injection 40 mg (40 mg Intravenous Given 11/13/20 1658)  LORazepam (ATIVAN) injection 2 mg (2 mg Intravenous Given 11/13/20 1659)  gadobutrol (GADAVIST) 1 MMOL/ML injection 10 mL (10 mLs Intravenous Contrast Given 11/13/20 2010)    Mobility walks with person assist

## 2020-11-13 NOTE — ED Triage Notes (Signed)
Per EMS-coming from [facility]-complaining of B/L leg pain-facility gave patient 324 mg of ASA and 0.4 mg of nitro for patient's HTN-multiple complaints

## 2020-11-13 NOTE — H&P (Addendum)
Jenna [name] WRU:[id number] DOB: 06/22/1955 DOA: 11/13/2020     PCP: [name], Betty G, MD   Outpatient Specialists:   CARDS:  Dr.Byrum NEphrology:  [facility].  Lorenso Courier    Patient arrived to ER on 11/13/20 at 1324 Referred by Attending Drenda Freeze, MD   Patient coming from:   From facility [facility]  Chief Complaint:  leg pain   HPI: Jenna [name] is a 65 y.o. female with medical history significant of anemia, CAD, depression, end-stage renal disease on hemodialysis Tuesday Thursday and Saturday, hypertension, focal segmental glomerularsclerosis, systolic CHF EF 91-47, lupus nephritis, ischemic cardiomyopathy, status post renal transplant now on immunosuppression history of LA thrombus, obesity, paroxysmal atrial fibrillation, CVA of parietal lobe, known CAD status post DES to LAD in September 2020    Presented with  B/L leg pain-facility gave patient 324 mg of ASA and 0.4 mg of nitro for patient's HTN-multiple complaints  Bilateral leg and swelling, just started today also abdominal pain nausea right hand is numb.  Chest pain for which she got 324 mg of aspirin.  As well as nitroglycerin with relief.  Recent admission for UTI was on meropenem.  Evidence of AKI while admitted. Just discharged back to SNF yesterday but felt like she was never feeling too good.  During last admission: Pseudomonas UTI/pyonephritis with transplanted kidney Patient completed 7-day course of IV antibiotics with meropenem.   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  Nephrology was consulted and followed during hospital course.  Tacrolimus level was elevated at 19 and nephrology recommended decreasing tacrolimus to 0.5 mg p.o. twice daily.  Patient was also started on sodium bicarb and 650 mg p.o. 3 times daily.  Home torsemide resumed.  Creatinine 3.47 at time of discharge.   Recommend repeat BMP 1 week.  Outpatient follow-up with nephrology.  Hem pos stool Gastroenterology was consulted and recommended twice daily PPI and outpatient follow-up for possible EGD.  Hemoglobin remained stable during hospitalization with no overt bleeding.  Acute on chronic HFrEF: EF 20-25%, follows with [facility] cardiology who are considering ICD placement.  Has chronic edema with small pleural effusion but otherwise no respiratory symptoms or evidence of decompensated CHF.  Continue home BiDil, Toprol-XL, torsemide.  Outpatient follow-up with cardiology.  Paroxysmal atrial fibrillation with bradycardia eliquis was held    Has  been vaccinated against COVID and boosted   Initial COVID TEST  NEGATIVE   Lab Results  Component Value Date   SARSCOV2NAA NEGATIVE 11/13/2020   [place] NEGATIVE 11/11/2020   [place] NEGATIVE 11/01/2020   [place] NEGATIVE 10/15/2020    Regarding pertinent Chronic problems:    Hyperlipidemia -  on statins crestor Lipid Panel     Component Value Date/Time   CHOL 124 05/23/2014 1301   TRIG 138 05/23/2014 1301   HDL 27 (L) 05/23/2014 1301   CHOLHDL 4.6 05/23/2014 1301   VLDL 28 05/23/2014 1301   LDLCALC 69 05/23/2014 1301   History of CMV viremia: Continue Valcyte   HTN on toprol   chronic CHF diastolic/systolic/ combined - last echo7/22  EF 20-30% On torsemide and  Bidil  SLE on Plaquenil    CAD  - On Aspirin, statin, betablocker,                - followed by cardiology  Sp renal transplant on prograf    obesity-   BMI Readings from Last 1 Encounters:  11/13/20 32.06 kg/m     Hx of CVA -  on Aspirin 81 mg,     A. Fib -  - CHA2DS2 vas score  7    Not on anticoagulation secondary recent GI bleed         -  Rate control:  Currently controlled with  Toprolol,       CKD stage IV- baseline Cr  3.5 Estimated Creatinine Clearance: 17.3 mL/min (A) (by C-G formula based on SCr of 3.8 mg/dL  (H)).  Lab Results  Component Value Date   CREATININE 3.80 (H) 11/13/2020   CREATININE 3.59 (H) 11/13/2020   CREATININE  11/13/2020    SPECIMEN HEMOLYZED. HEMOLYSIS MAY AFFECT INTEGRITY OF RESULTS.     Chronic anemia - baseline hg Hemoglobin & Hematocrit  Recent Labs    11/11/20 0601 11/13/20 1442 11/13/20 1623  HGB 10.8* 11.7* 13.9    While in ER:  Trop 145- 131 CXR with evidence of fluid ovreload ??? pnA  CT renal study left base suspicious for infection or aspiration.  Given 2 mg Ativan IV for sedation for MRI  Now somnolent  ED Triage Vitals  Enc Vitals Group     BP 11/13/20 1332 (!) 125/113     Pulse Rate 11/13/20 1332 75     Resp 11/13/20 1332 18     Temp 11/13/20 1332 97.8 F (36.6 C)     Temp Source 11/13/20 1332 Oral     SpO2 11/13/20 1332 96 %     Weight 11/13/20 1336 204 lb 11.2 oz (92.9 kg)     Height 11/13/20 1336 5\' 7"  (1.702 m)     Head Circumference --      Peak Flow --      Pain Score 11/13/20 1335 10     Pain Loc --      Pain Edu? --      Excl. in GC? --   TMAX(24)@     _________________________________________ Significant initial  Findings: Abnormal Labs Reviewed  CBC WITH DIFFERENTIAL/PLATELET - Abnormal; Notable for the following components:      Result Value   WBC 1.8 (*)    Hemoglobin 11.7 (*)    RDW 21.8 (*)    Platelets 86 (*)    nRBC 1.6 (*)    Neutro Abs 1.0 (*)    Lymphs Abs 0.3 (*)    Abs Immature Granulocytes 0.27 (*)    All other components within normal limits  BRAIN NATRIURETIC PEPTIDE - Abnormal; Notable for the following components:   B Natriuretic Peptide >4,500.0 (*)    All other components within normal limits  URINALYSIS, ROUTINE W REFLEX MICROSCOPIC - Abnormal; Notable for the following components:   Ketones, ur 5 (*)    Protein, ur >=300 (*)    All other components within normal limits  COMPREHENSIVE METABOLIC PANEL - Abnormal; Notable for the following components:   CO2 19 (*)    BUN 88 (*)    Creatinine,  Ser 3.59 (*)    Calcium 10.4 (*)    Alkaline Phosphatase 139 (*)    Total Bilirubin 1.5 (*)    GFR, Estimated 13 (*)    All other components within normal limits  I-STAT CHEM 8, ED - Abnormal; Notable for the following components:   BUN 97 (*)    Creatinine, Ser 3.80 (*)    TCO2 21 (*)  All other components within normal limits  TROPONIN I (HIGH SENSITIVITY) - Abnormal; Notable for the following components:   Troponin I (High Sensitivity) 145 (*)    All other components within normal limits  TROPONIN I (HIGH SENSITIVITY) - Abnormal; Notable for the following components:   Troponin I (High Sensitivity) 145 (*)    All other components within normal limits  TROPONIN I (HIGH SENSITIVITY) - Abnormal; Notable for the following components:   Troponin I (High Sensitivity) 131 (*)    All other components within normal limits   ____________________________________________ Ordered CT HEAD Interval development of white matter hypodensity in the right occipital lobe.  CXR - pNA  CTabd/pelvis -  nonacute/ PNA  mRI - No acute intracranial abnormality.  No mass lesion. 2. 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  __ Troponin 145- 131  ECG: Ordered Personally reviewed by me showing: HR : 72 Rhythm: NSR,  short Pr   no evidence of ischemic changes QTC 491   The recent clinical data is shown below. Vitals:   11/13/20 1830 11/13/20 1845 11/13/20 1945 11/13/20 2147  BP: 113/70 123/68 123/74 120/77  Pulse:  (!) 55 80 (!) 48  Resp:  $Remo'17 20 20  'gDcJI$ Temp:      TempSrc:      SpO2:  97% 99% 99%  Weight:      Height:        WBC     Component Value Date/Time   WBC 1.8 (L) 11/13/2020 1442   LYMPHSABS 0.3 (L) 11/13/2020 1442   MONOABS 0.3 11/13/2020 1442   EOSABS 0.0 11/13/2020 1442   BASOSABS 0.0 11/13/2020 1442     UA   no evidence of UTI     Urine analysis:    Component Value Date/Time   COLORURINE YELLOW 11/13/2020 1410    APPEARANCEUR CLEAR 11/13/2020 1410   LABSPEC 1.015 11/13/2020 1410   PHURINE 5.0 11/13/2020 1410   GLUCOSEU NEGATIVE 11/13/2020 1410   HGBUR NEGATIVE 11/13/2020 1410   BILIRUBINUR NEGATIVE 11/13/2020 1410   BILIRUBINUR neg 12/19/2014 1157   KETONESUR 5 (A) 11/13/2020 1410   PROTEINUR >=300 (A) 11/13/2020 1410   UROBILINOGEN 0.2 12/19/2014 1157   UROBILINOGEN 0.2 05/23/2014 1301   NITRITE NEGATIVE 11/13/2020 1410   LEUKOCYTESUR NEGATIVE 11/13/2020 1410    Results for orders placed or performed during the hospital encounter of 11/13/20  Resp Panel by RT-PCR (Flu A&B, Covid) Nasopharyngeal Swab     Status: None   Collection Time: 11/13/20  5:49 PM   Specimen: Nasopharyngeal Swab; Nasopharyngeal(NP) swabs in vial transport medium  Result Value Ref Range Status   SARS Coronavirus 2 by RT PCR NEGATIVE NEGATIVE Final         Influenza A by PCR NEGATIVE NEGATIVE Final   Influenza B by PCR NEGATIVE NEGATIVE Final           _______________________________________________ Hospitalist was called for admission for CHF exacerbation and possible PNA  The following Work up has been ordered so far:  Orders Placed This Encounter  Procedures   Resp Panel by RT-PCR (Flu A&B, Covid) Nasopharyngeal Swab   DG Chest 2 View   CT Renal Stone Study   CT HEAD WO CONTRAST (5MM)   MR Brain W and Wo Contrast   Comprehensive metabolic panel   CBC with Differential   Brain natriuretic peptide   Urinalysis, Routine w reflex microscopic   Comprehensive metabolic panel   Lipase, blood   Consult to  hospitalist   I-stat chem 8, ED (not at [facility] or [facility])   ED EKG   EKG 12-Lead      Following Medications were ordered in ER: Medications  morphine 4 MG/ML injection 4 mg (4 mg Intravenous Given 11/13/20 1543)  furosemide (LASIX) injection 40 mg (40 mg Intravenous Given 11/13/20 1658)  LORazepam (ATIVAN) injection 2 mg (2 mg Intravenous Given 11/13/20 1659)  gadobutrol (GADAVIST) 1 MMOL/ML injection 10 mL  (10 mLs Intravenous Contrast Given 11/13/20 2010)        Consult Orders  (From admission, onward)           Start     Ordered   11/13/20 2147  Consult to hospitalist  Once       Provider:  (Not yet assigned)  Question Answer Comment  Place call to: [facility]   Reason for Consult Admit      11/13/20 2146              OTHER Significant initial  Findings:  labs showing:     Recent Labs  Lab 11/07/20 0233 11/08/20 0559 11/09/20 0539 11/10/20 0223 11/11/20 0601 11/11/20 1044 11/13/20 1442 11/13/20 1612 11/13/20 1623  NA 132* 133* 132* 134* 133* 133* SPECIMEN HEMOLYZED. HEMOLYSIS MAY AFFECT INTEGRITY OF RESULTS. 140 136  K 4.2 4.3 4.2 4.4 6.1* 4.7 SPECIMEN HEMOLYZED. HEMOLYSIS MAY AFFECT INTEGRITY OF RESULTS. 4.2 4.8  CO2 18* 17* 19* 18* 17* 15* SPECIMEN HEMOLYZED. HEMOLYSIS MAY AFFECT INTEGRITY OF RESULTS. 19*  --   GLUCOSE 88 90 91 78 82 75 SPECIMEN HEMOLYZED. HEMOLYSIS MAY AFFECT INTEGRITY OF RESULTS. 97 92  BUN 76* 77* 74* 75* 80* 79* SPECIMEN HEMOLYZED. HEMOLYSIS MAY AFFECT INTEGRITY OF RESULTS. 88* 97*  CREATININE 4.24* 3.79* 3.62* 3.40* 3.53* 3.47* SPECIMEN HEMOLYZED. HEMOLYSIS MAY AFFECT INTEGRITY OF RESULTS. 3.59* 3.80*  CALCIUM 9.0 9.0 9.3 9.3 9.4 9.7 SPECIMEN HEMOLYZED. HEMOLYSIS MAY AFFECT INTEGRITY OF RESULTS. 10.4*  --   MG 2.6*  --  2.5* 2.4 2.5*  --   --   --   --   PHOS 4.9* 4.8* 4.5 4.6 4.8*  --   --   --   --     Cr   Up from baseline see below Lab Results  Component Value Date   CREATININE 3.80 (H) 11/13/2020   CREATININE 3.59 (H) 11/13/2020   CREATININE  11/13/2020    SPECIMEN HEMOLYZED. HEMOLYSIS MAY AFFECT INTEGRITY OF RESULTS.    Recent Labs  Lab 11/09/20 0539 11/10/20 0223 11/11/20 0601 11/13/20 1442 11/13/20 1612  AST  --  24 46* SPECIMEN HEMOLYZED. HEMOLYSIS MAY AFFECT INTEGRITY OF RESULTS. 32  ALT  --  20 19 SPECIMEN HEMOLYZED. HEMOLYSIS MAY AFFECT INTEGRITY OF RESULTS. [place]  HEMOLYSIS MAY AFFECT INTEGRITY OF RESULTS. 139*  BILITOT  --  1.5* 1.1 SPECIMEN HEMOLYZED. HEMOLYSIS MAY AFFECT INTEGRITY OF RESULTS. 1.5*  PROT  --  6.3* 6.0* SPECIMEN HEMOLYZED. HEMOLYSIS MAY AFFECT INTEGRITY OF RESULTS. 7.6  ALBUMIN 3.0* 2.9* 2.8* SPECIMEN HEMOLYZED. HEMOLYSIS MAY AFFECT INTEGRITY OF RESULTS. 3.5   Lab Results  Component Value Date   CALCIUM 10.4 (H) 11/13/2020   PHOS 4.8 (H) 11/11/2020          Plt: Lab Results  Component Value Date   PLT 86 (L) 11/13/2020       Venous  Blood Gas ordred  ABG    Component Value Date/Time   TCO2 21 (L) 11/13/2020 1623  Recent Labs  Lab 11/07/20 0233 11/09/20 0539 11/10/20 0223 11/11/20 0601 11/13/20 1442 11/13/20 1623  WBC 2.8* 2.8* 2.3* 2.2* 1.8*  --   NEUTROABS  --  2.0 1.6* 1.3* 1.0*  --   HGB 10.5* 11.5* 11.0* 10.8* 11.7* 13.9  HCT 32.7* 35.8* 33.1* 33.1* 36.2 41.0  MCV 91.9 91.6 89.9 90.4 91.4  --   PLT 77* 69* 71* 65* 86*  --     HG/HCT   stable,       Component Value Date/Time   HGB 13.9 11/13/2020 1623   HCT 41.0 11/13/2020 1623   MCV 91.4 11/13/2020 1442   MCV 82.4 04/28/2011 1841     Recent Labs  Lab 11/13/20 1612  LIPASE 22    BNP (last 3 results) Recent Labs    11/06/20 1246 11/10/20 0223 11/13/20 1442  BNP >4,500.0* >4,500.0* >4,500.0*      DM  labs:  HbA1C: Recent Labs    07/11/20 1742  HGBA1C 5.3        Cultures:    Component Value Date/Time   SDES IN/OUT CATH URINE 11/01/2020 1618   SPECREQUEST  11/01/2020 1618    Immunocompromised Performed at [facility], [place] [address] [place], [place] [address]    CULT >=100,000 COLONIES/mL PSEUDOMONAS AERUGINOSA (A) 11/01/2020 1618   REPTSTATUS 11/04/2020 FINAL 11/01/2020 1618     Radiological Exams on Admission: DG Chest 2 View  Result Date: 11/13/2020 CLINICAL DATA:  Chest pain and shortness of breath. Bilateral leg pain and swelling. EXAM: CHEST - 2 VIEW COMPARISON:  11/10/2020 FINDINGS: Persistent airspace  opacities in the right middle lobe both lower lobes. The airspace opacity in the left lower lobe and in the right middle lobe is mildly worsened, with a reduced amount of aeration in the involved regions. There is at least mild enlargement of the cardiopericardial silhouette. Mild prominence of upper zone pulmonary vasculature may reflect pulmonary venous hypertension. Linear subsegmental atelectasis in the left mid lung appears stable. IMPRESSION: 1. Worsened bibasilar airspace opacities, suspicious for pneumonia or aspiration. 2. Cardiomegaly with pulmonary venous hypertension. Electronically Signed   By: Van Clines M.D.   On: 11/13/2020 14:54   CT HEAD WO CONTRAST (5MM)  Result Date: 11/13/2020 CLINICAL DATA:  Dizziness Left-sided weakness EXAM: CT HEAD WITHOUT CONTRAST TECHNIQUE: Contiguous axial images were obtained from the base of the skull through the vertex without intravenous contrast. COMPARISON:  None. FINDINGS: Brain: No acute intracranial hemorrhage. No midline shift or hydrocephalus. Interval development of low-density in the right occipital lobe white matter. Vascular: No hyperdense vessel or unexpected calcification. Skull: Normal. Negative for fracture or focal lesion. Sinuses/Orbits: No acute finding. Other: None. IMPRESSION: 1. No acute intracranial hemorrhage. 2. Interval development of white matter hypodensity in the right occipital lobe. While this may be due to interval infarct, findings may also be the result of vasogenic edema related to underlying mass. Further evaluation with contrast-enhanced MRI of the brain should be performed when patient condition allows. Electronically Signed   By: Miachel Roux M.D.   On: 11/13/2020 15:51   MR Brain W and Wo Contrast  Result Date: 11/13/2020 CLINICAL DATA:  Dizziness and possible brain mass EXAM: MRI HEAD WITHOUT AND WITH CONTRAST TECHNIQUE: Multiplanar, multiecho pulse sequences of the brain and surrounding structures were obtained  without and with intravenous contrast. CONTRAST:  91mL GADAVIST GADOBUTROL 1 MMOL/ML IV SOLN COMPARISON:  Head CT 11/13/2020 Brain MRI 05/05/2013 FINDINGS: Brain: No acute infarct, mass effect or extra-axial  collection. No acute or chronic hemorrhage. There is hyperintense T2-weighted signal in the posterior right parietal lobe, likely an old infarct. Mild periventricular white matter hyperintense T2-weighted signal. The midline structures are normal. Vascular: Major flow voids are preserved. Skull and upper cervical spine: Normal calvarium and skull base. Visualized upper cervical spine and soft tissues are normal. Sinuses/Orbits:No paranasal sinus fluid levels or advanced mucosal thickening. No mastoid or middle ear effusion. Normal orbits. IMPRESSION: 1. No acute intracranial abnormality.  No mass lesion. 2. 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 Electronically Signed   By: Ulyses Jarred M.D.   On: 11/13/2020 21:25   CT Renal Stone Study  Result Date: 11/13/2020 CLINICAL DATA:  Bilateral leg pain and swelling that began 2-3 hours, abdominal pain, nausea EXAM: CT ABDOMEN AND PELVIS WITHOUT CONTRAST TECHNIQUE: Multidetector CT imaging of the abdomen and pelvis was performed following the standard protocol without IV contrast. COMPARISON:  CT abdomen/pelvis 11/01/2020, MR abdomen 11/03/2020 FINDINGS: Lower chest: There are bilateral pleural effusions, increased since 11/01/2020. Patchy opacities in the left lower lobe are increased since the prior study are suspicious for infection or aspiration. The heart is enlarged. Coronary artery calcifications are again seen. Hepatobiliary: The liver is unremarkable, within the confines of noncontrast technique. The previously reported abnormality seen on the abdominal ultrasound from 11/01/2020 is not identified. The gallbladder is contracted but again demonstrates a thickened wall and mild pericholecystic fat stranding.  Pancreas: Unremarkable. Spleen: Unremarkable. Adrenals/Urinary Tract: The adrenals are unremarkable. The native kidneys are atrophic, with no hydronephrosis or hydroureter. A transplant kidney is again seen in the right lower quadrant. There is prominence of the transplant ureter and pelvis with mild stranding in the surrounding fat. There are no focal lesions or stones seen in the transplant kidney or ureter. Overall, the findings are similar to the study from 11/01/2020. The bladder is decompressed but grossly unremarkable. Stomach/Bowel: The stomach is unremarkable. There is no evidence of bowel obstruction. There is no evidence of abnormal bowel wall thickening or inflammatory change. There is fluid in the distal colon/rectum. There are scattered colonic diverticula without evidence of acute diverticulitis. Vascular/Lymphatic: There is calcified atherosclerotic plaque throughout the nonaneurysmal abdominal aorta. There is no abdominal or pelvic lymphadenopathy. Reproductive: A calcification in the uterus likely reflects a fibroid. There is no adnexal mass. Other: There is trace free fluid in the pelvis, nonspecific and not significantly changed. There is no free air. There is diffuse body wall edema, similar to the prior study. Musculoskeletal: There is no acute osseous abnormality or aggressive osseous lesion. IMPRESSION: 1. Overall, no new acute finding in the abdomen or pelvis compared to the study from 11/01/2020. 2. Similar appearance of the transplant kidney in the right lower quadrant with unchanged prominence of the pelvis and ureter with no stone or other obstructing lesion seen. There is mild stranding in the surrounding fat, also similar to the prior study. Correlate with symptoms and laboratory values if there is concern for pyelonephritis. 3. The gallbladder again demonstrates mild wall thickening and pericholecystic fat stranding. This is again nonspecific and could be due to heart failure. This  could be evaluated with HIDA scan if there is clinical concern for acute cholecystitis. 4. Bilateral pleural effusions, increased since 11/01/2020, with increased patchy opacities in the left base suspicious for infection or aspiration. 5. Fluid in the colon which can be seen in the setting of diarrhea. 6. Diffuse body wall edema and trace free fluid in the  pelvis, similar to the prior study. 7. Unchanged cardiomegaly. 8.  Aortic Atherosclerosis (ICD10-I70.0). Electronically Signed   By: Valetta Mole M.D.   On: 11/13/2020 15:59   _______________________________________________________________________________________________________ Latest  Blood pressure 120/77, pulse (!) 48, temperature 97.8 F (36.6 C), temperature source Oral, resp. rate 20, height $RemoveBe'5\' 7"'YzAEjqyCe$  (1.702 m), weight 92.9 kg, SpO2 99 %.   Review of Systems:    Pertinent positives include:   nausea, fatigue,Bilateral lower extremity swelling   Constitutional:  No weight loss, night sweats, Fevers, chills,  weight loss  HEENT:  No headaches, Difficulty swallowing,Tooth/dental problems,Sore throat,  No sneezing, itching, ear ache, nasal congestion, post nasal drip,  Cardio-vascular:  No chest pain, Orthopnea, PND, anasarca, dizziness, palpitations.no  GI:  No heartburn, indigestion, abdominal pain,vomiting, diarrhea, change in bowel habits, loss of appetite, melena, blood in stool, hematemesis Resp:  no shortness of breath at rest. No dyspnea on exertion, No excess mucus, no productive cough, No non-productive cough, No coughing up of blood.No change in color of mucus.No wheezing. Skin:  no rash or lesions. No jaundice GU:  no dysuria, change in color of urine, no urgency or frequency. No straining to urinate.  No flank pain.  Musculoskeletal:  No joint pain or no joint swelling. No decreased range of motion. No back pain.  Psych:  No change in mood or affect. No depression or anxiety. No memory loss.  Neuro: no localizing  neurological complaints, no tingling, no weakness, no double vision, no gait abnormality, no slurred speech, no confusion  All systems reviewed and apart from [place] all are negative _______________________________________________________________________________________________ Past Medical History:   Past Medical History:  Diagnosis Date   Anemia    Anxiety    panic attack- talks herself and takes deep breathes   CAD (coronary artery disease)    a. STEMI 10/2018 s/p DES to LAD.   Depression    Dyspnea    with exertion    ESRD (end stage renal disease) ([place])    hemo TTHSAT   Essential hypertension    FSGS (focal segmental glomerulosclerosis) 2011   By renal biopsy   Head injury    age 9   HFrEF (heart failure with reduced ejection fraction) ([place])    History of kidney stones    kidney stone   Hx of lupus nephritis 2011   by renal biopsy   Ischemic cardiomyopathy    Kidney transplant recipient    LA thrombus 10/2018   Lupus (systemic lupus erythematosus) ([place])    followed by Dr. Amil Amen   Obesity    Orthostatic hypotension    PAF (paroxysmal atrial fibrillation) ([place])    Parietal lobe infarction ([place])    a. remote parietal infarct on brain MRI 12/2019.   Pericarditis    age 59ish   Renal stone    Secondary hyperparathyroidism of renal origin ([place])    SLE (systemic lupus erythematosus) ([name])    TIA (transient ischemic attack)    no residual effects      Past Surgical History:  Procedure Laterality Date   AV FISTULA PLACEMENT Left 07/04/2017   Procedure: ARTERIOVENOUS (AV) FISTULA CREATION BRACHIOCEPHALIC;  Surgeon: [name], MD;  Location: [facility] OR;  Service: Vascular;  Laterality: Left;   COLONOSCOPY W/ POLYPECTOMY     IR FLUORO GUIDE CV LINE RIGHT  06/28/2017   IR US GUIDE VASC ACCESS RIGHT  06/28/2017   KIDNEY SURGERY     kidney transplant 2020   REVISION OF ARTERIOVENOUS GORETEX GRAFT Left  11/30/2017   Procedure: REVISION OF ARTERIOVENOUS FISTULA LEFT ARM  Superfistulization and branch ligation.;  Surgeon: Waynetta Sandy, MD;  Location: [facility];  Service: Vascular;  Laterality: Left;    Social History:  Ambulatory  wheelchair bound,      reports that she has never smoked. She has been exposed to tobacco smoke. She has never used smokeless tobacco. She reports that she does not drink alcohol and does not use drugs.   Family History:   Family History  Problem Relation Age of Onset   Diabetes Mother    Hypertension Father    Cancer Sister    Hypertension Brother    ______________________________________________________________________________________________ Allergies: Allergies  Allergen Reactions   Enalapril Maleate Anaphylaxis, Swelling and Other (See Comments)    Throat swells   Penicillins Other (See Comments)    Made patient lightheaded PATIENT HAS HAD A PCN REACTION WITH IMMEDIATE RASH, FACIAL/TONGUE/THROAT SWELLING, SOB, OR LIGHTHEADEDNESS WITH HYPOTENSION:  #  #  YES  #  #  Has patient had a PCN reaction causing severe rash involving mucus membranes or skin necrosis: No Has patient had a PCN reaction that required hospitalization: No Has patient had a PCN reaction occurring within the last 10 years: No If all of the above answers are "NO", then may proceed with Cephalosporin use.    Chocolate Nausea And Vomiting   Tape Itching, Rash and Other (See Comments)     Prior to Admission medications   Medication Sig Start Date End Date Taking? Authorizing Provider  acetaminophen (TYLENOL) 500 MG tablet Take 1,000 mg by mouth every 6 (six) hours as needed for headache.   Yes [provider]  ALPRAZolam (XANAX) 0.25 MG tablet Take 1 tablet (0.25 mg total) by mouth at bedtime as needed for anxiety. 11/11/20  Yes Sheikh, Omair Latif, DO  aspirin 81 MG EC tablet Take 81 mg by mouth every morning. 11/21/19  Yes [provider]  citalopram (CELEXA) 10 MG tablet Take 1 tablet (10 mg total) by mouth daily.  10/29/20  Yes [name], Betty G, MD  gabapentin (NEURONTIN) 100 MG capsule Take 1 capsule (100 mg total) by mouth at bedtime. 11/11/20  Yes Sheikh, Omair Latif, DO  hydroxychloroquine (PLAQUENIL) 200 MG tablet Take 1 tablet (200 mg total) by mouth 2 (two) times daily. 12/25/13  Yes Jegede, Olugbemiga E, MD  isosorbide-hydrALAZINE (BIDIL) 20-37.5 MG tablet Take 1 tablet by mouth 3 (three) times daily. 08/26/20  Yes Debbe Odea, MD  metoprolol succinate (TOPROL-XL) 25 MG 24 hr tablet Take 1 tablet (25 mg total) by mouth daily. 10/29/20  Yes [name], Betty G, MD  nitroGLYCERIN (NITROSTAT) 0.4 MG SL tablet Place 0.4 mg under the tongue every 5 (five) minutes as needed for chest pain.    Yes [provider]  ondansetron (ZOFRAN) 4 MG tablet Take 1 tablet (4 mg total) by mouth every 6 (six) hours as needed for nausea. Patient taking differently: Take 4 mg by mouth every 6 (six) hours as needed for nausea or vomiting. 11/11/20  Yes Sheikh, Omair Latif, DO  pantoprazole (PROTONIX) 40 MG tablet Take 1 tablet (40 mg total) by mouth 2 (two) times daily before a meal. Patient taking differently: Take 40 mg by mouth 2 (two) times daily. 11/11/20  Yes Sheikh, Omair Latif, DO  predniSONE (DELTASONE) 5 MG tablet Take 5 mg by mouth daily. 05/16/19  Yes [provider]  rosuvastatin (CRESTOR) 5 MG tablet Take 5 mg by mouth daily at 6 PM. 05/02/19  Yes [provider]  simethicone (MYLICON) 80 MG chewable tablet Chew 80 mg by mouth every 6 (six) hours as needed for flatulence.   Yes [provider]  sodium bicarbonate 650 MG tablet Take 1 tablet (650 mg total) by mouth 3 (three) times daily. 11/11/20  Yes Sheikh, Omair Latif, DO  tacrolimus (PROGRAF) 0.5 MG capsule Take 1 capsule (0.5 mg total) by mouth daily. 11/12/20  Yes Sheikh, Omair Latif, DO  torsemide (DEMADEX) 20 MG tablet Take 20 mg by mouth 2 (two) times daily.   Yes [provider]  valGANciclovir (VALCYTE) 450 MG tablet  Take 450 mg by mouth every other day.   Yes [provider]  tacrolimus (PROGRAF) 0.5 MG capsule Take 1 capsule (0.5 mg total) by mouth at bedtime. Patient not taking: No sig reported 11/11/20   Kerney Elbe, DO    ___________________________________________________________________________________________________ Physical Exam: Vitals with BMI 11/13/2020 11/13/2020 11/13/2020  Height - - -  Weight - - -  BMI - - -  Systolic 092 330 076  Diastolic 77 74 68  Pulse 48 80 55    1. General:  in No  Acute distress    Chronically ill   -appearing 2. Psychological: somnolent but Oriented 3. Head/ENT:   Moist  Mucous Membranes                          Head Non traumatic, neck supple                           Poor Dentition Bilateral JVD L>R, venous distention in the chest and left neck  4. SKIN: normal  Skin turgor,  Skin clean Dry and intact no rash 5. Heart: Regular rate and rhythm fistula referral Murmur, no Rub or gallop 6. Lungs:  no wheezes some crackles   7. Abdomen: Soft,  non-tender, Non distended   obese  bowel sounds present 8. Lower extremities: no clubbing, cyanosis, 1+ edema 9. Neurologically Grossly intact, moving all 4 extremities equally   10. MSK: Normal range of motion    Chart has been reviewed  ______________________________________________________________________________________________  Assessment/Plan 65 y.o. female with medical history significant of anemia, CAD, depression, end-stage renal disease on hemodialysis Tuesday Thursday and Saturday, hypertension, focal segmental glomerularsclerosis, systolic CHF EF 22-63, lupus nephritis, ischemic cardiomyopathy, status post renal transplant now on immunosuppression history of LA thrombus, obesity, paroxysmal atrial fibrillation, CVA of parietal lobe, known CAD status post DES to LAD in September 2020  Admitted for  acute on chronic systolic CHF  Present on Admission:   Acute on chronic systolic CHF  (congestive heart failure) ([place])- - Pt diagnosed with CHF based on presence of the following: JVD, cardiomegaly, Pulmonary edema on CXR, and   bilateral leg edema,   With noted response to IV diuretic in ER  admit on telemetry,  cycle cardiac enzymes, Troponin  145-131  obtain serial ECG  to evaluate for ischemia as a cause of heart failure  monitor daily weight:  Filed Weights   11/13/20 1336  Weight: 92.9 kg   Last BNP BNP (last 3 results) Recent Labs    11/06/20 1246 11/10/20 0223 11/13/20 1442  BNP >4,500.0* >4,500.0* >4,500.0*    ProBNP (last 3 results) No results for input(s): PROBNP in the last 8760 hours.   diurese with IV lasix and monitor orthostatics and creatinine to avoid over diuresis.  Order echogram to evaluate EF  and valves  ACE/ARBi  Contraindicated    cardiology consulted    Thrombocytopenia ([name]) - chronic stable   Paroxysmal atrial fibrillation ([name]) -hold anticoagulation hold Toprol given bradycardia appreciate cardiology input   Lupus (systemic lupus erythematosus) ([place]) -continue Plaquenil   Hypertension with renal disease -hold Toprol given bradycardia   Elevated troponin -chronic appears to be stable continue to monitor Patient did endorse brief chest pain currently denies.  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[place])  -continue home medications   CKD (chronic kidney disease) stage 5, GFR less than 15 ml/min ([name]) - -chronic avoid nephrotoxic medications such as NSAIDs, Vanco Zosyn combo,  avoid hypotension, continue to follow renal function   CAD (coronary artery disease) continue aspirin statin   Anemia of chronic disease -monitor and transfuse if needed for hemoglobin below 7 Chronic in the setting of pancytopenia   JVD and 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  Slowly improving we will continue to monitor in stepdown if significant somnolence will obtain ABG Patient was not able to obtain VBG secondary to poor vasculature.  Now she is doing better Continue to monitor hold home sedation medications   Sp renal transplant -followed by nephrology continue Prograf we will need to have nephrology consult regarding Prograf dosing and adjustment in a.m. Can try to check Prograf trough Other plan as per orders.  DVT prophylaxis:  SCD       Code Status:    Code Status: Prior FULL CODE    as per patient  I had personally discussed CODE STATUS with patient     Family Communication:   Family not at  Bedside    Disposition Plan:                             Back to current facility when stable                            Following barriers for discharge:                            Electrolytes corrected                                                            Pain controlled with PO medications                              able to transition to PO antibiotics                             Will need to be able to tolerate PO  Will need consultants to evaluate patient prior to discharge                       Would benefit from PT/OT eval prior to DC  Ordered                   Swallow eval - SLP ordered                                       Transition of care consulted                                                    Palliative care    consulted                                     Consults called: email cardiology, please consult nephrology in AM  Admission status:  ED Disposition     ED Disposition  [place]: [facility] [[id number]]  Level of Care:  Progressive [102]  Admit to Progressive based on following criteria: CARDIOVASCULAR & THORACIC of moderate stability with acute coronary syndrome symptoms/low risk myocardial infarction/hypertensive urgency/arrhythmias/heart failure potentially compromising stability and stable post cardiovascular intervention patients.  May place patient in observation at [facility] or [place] if equivalent level of care is available:: No  Covid Evaluation: Confirmed COVID Negative  Diagnosis: Fluid overload [[id number]]  Admitting Physician: Toy Baker [[id number]]  Attending Physician: Toy Baker [[id number]]           inpatient     I Expect 2 midnight stay secondary to severity of patient's current illness need for inpatient interventions justified by the following:  hemodynamic instability despite optimal treatment (bradyycardia   )  Severe lab/radiological/exam abnormalities including:    LLL PNA and extensive comorbidities including:     CHF  CAD      Morbid Obesity  CKD    That are currently affecting medical management.   I expect  patient to be hospitalized for 2 midnights requiring inpatient medical care.  Patient is at high risk for adverse outcome (such as loss of life or disability) if not treated.  Indication for inpatient stay as follows:  Severe change from baseline regarding mental status    inability to maintain oral hydration     Need for IV antibiotics,  IV diuretics    Level of care   progressive tele indefinitely please discontinue once patient no longer qualifies COVID-19 Labs    Lab Results  Component Value Date   [place] NEGATIVE 11/13/2020     Precautions: admitted as Covid Negative   PPE: Used by the provider:   N95  eye Goggles,  Gloves    Iwalani Templeton 11/13/2020, 11:54 PM    [facility]     after 2 AM please page floor coverage PA If 7AM-7PM, please contact the day team taking care of the patient using [email or web address]    Patient was evaluated in the context of the global COVID-19 pandemic, which necessitated consideration that the patient might be at risk for infection with the SARS-CoV-2  virus that causes COVID-19. Institutional protocols and algorithms that pertain to the evaluation of patients at risk for COVID-19 are in a state of rapid change based on information released by regulatory bodies including the CDC and federal and state organizations. These policies and algorithms were followed during the patient's care.

## 2020-11-13 NOTE — ED Provider Notes (Signed)
[facility] DEPT Provider Note   CSN: [id number] Arrival date & time: 11/13/20  1324     History No chief complaint on file.   Jenna [name] is a 65 y.o. female hx of CAD, ESRD s/p renal transplant on immunosuppressants, hypertension, heart failure, lupus, here presenting with diffuse pain and leg swelling.  Patient was recently admitted to the hospital and had a UTI that finished meropenem.  Patient also had worsening renal failure.  Patient saw palliative care and still wishes to continue treatments.  Patient went to the SNF yesterday and states that she just does not feel well.  She has continued leg swelling and left-sided numbness and also left-sided abdominal pain.  Patient has some subjective shortness of breath as well.  The history is provided by the patient.      Past Medical History:  Diagnosis Date   Anemia    Anxiety    panic attack- talks herself and takes deep breathes   CAD (coronary artery disease)    a. STEMI 10/2018 s/p DES to LAD.   Depression    Dyspnea    with exertion    ESRD (end stage renal disease) ([place])    hemo TTHSAT   Essential hypertension    FSGS (focal segmental glomerulosclerosis) 2011   By renal biopsy   Head injury    age 61   HFrEF (heart failure with reduced ejection fraction) ([place])    History of kidney stones    kidney stone   Hx of lupus nephritis 2011   by renal biopsy   Ischemic cardiomyopathy    Kidney transplant recipient    LA thrombus 10/2018   Lupus (systemic lupus erythematosus) ([place])    followed by Dr. 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 remote parietal infarct on brain MRI 12/2019.   Pericarditis    age 70ish   Renal stone    Secondary hyperparathyroidism of renal origin ([place])    SLE (systemic lupus erythematosus) ([place])    TIA (transient ischemic attack)    no residual effects     Patient Active Problem List   Diagnosis Date Noted   Shortness of breath    AKI (acute kidney injury) ([place]) 11/02/2020   Right upper quadrant pain    Acute renal failure superimposed on stage 4 chronic kidney disease ([place]) 11/01/2020   Pyelonephritis of transplanted kidney 11/01/2020   Thrombocytopenia ([place]) 11/01/2020   Liver mass 11/01/2020   Heme positive stool 11/01/2020   Insomnia 10/29/2020   Peripheral neuropathy 10/13/2020   Acute on chronic systolic CHF (congestive heart failure) ([place]) 07/11/2020   Acute respiratory failure with hypoxia ([place]) 07/11/2020   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   Elevated troponin 07/11/2020   Anemia of chronic disease 07/11/2020   Acute kidney injury superimposed on chronic kidney disease ([place]) 07/11/2020   Allergic rhinitis 01/01/2020   Anxiety disorder 06/26/2019   HFrEF (heart failure with reduced ejection fraction) ([place]) 03-09-2019   CAD (coronary artery disease) 03/09/19   Paroxysmal atrial fibrillation ([place]) Mar 09, 2019   Deceased-donor kidney transplant 03/27/2018   Dyslipidemia 11/06/2017   CKD (chronic kidney disease) stage 5, GFR less than 15 ml/min ([name]) 02/15/2017   Back pain at L4-L5 level 10/31/2014   Muscle spasm of back 10/31/2014   Chronic maxillary sinusitis 07/04/2014   Occipital headache 07/04/2014   Increased urinary frequency  05/23/2014   Falls 05/23/2014   Rash and nonspecific skin eruption 05/23/2014   Hypertension with renal disease 04/16/2014   Encounter for immunization 11/19/2013   Lupus (systemic lupus erythematosus) ([place]) 09/24/2013   TIA (transient ischemic attack) 05/05/2013   Numbness and tingling of left side of face 05/04/2013   Lupus ([place]) 04/15/2011   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    Past Surgical History:  Procedure Laterality Date   AV FISTULA PLACEMENT Left 07/04/2017   Procedure: ARTERIOVENOUS (AV) FISTULA CREATION  BRACHIOCEPHALIC;  Surgeon: [name], MD;  Location: [facility];  Service: Vascular;  Laterality: Left;   COLONOSCOPY W/ POLYPECTOMY     IR FLUORO GUIDE CV LINE RIGHT  06/28/2017   IR US GUIDE VASC ACCESS RIGHT  06/28/2017   KIDNEY SURGERY     kidney transplant 2020   REVISION OF ARTERIOVENOUS GORETEX GRAFT Left 11/30/2017   Procedure: REVISION OF ARTERIOVENOUS FISTULA LEFT ARM Superfistulization and branch ligation.;  Surgeon: Waynetta Sandy, MD;  Location: [facility];  Service: Vascular;  Laterality: Left;     OB History     Gravida  2   Para  1   Term      Preterm      AB  1   Living  1      SAB  1   IAB      Ectopic      Multiple      Live Births              Family History  Problem Relation Age of Onset   Diabetes Mother    Hypertension Father    Cancer Sister    Hypertension Brother     Social History   Tobacco Use   Smoking status: Never    Passive exposure: Past   Smokeless tobacco: Never  Vaping Use   Vaping Use: Never used  Substance Use Topics   Alcohol use: No   Drug use: No    Home Medications Prior to Admission medications   Medication Sig Start Date End Date Taking? Authorizing Provider  acetaminophen (TYLENOL) 500 MG tablet Take 1,000 mg by mouth every 6 (six) hours as needed for headache.   Yes [provider]  ALPRAZolam (XANAX) 0.25 MG tablet Take 1 tablet (0.25 mg total) by mouth at bedtime as needed for anxiety. 11/11/20  Yes Sheikh, Omair Latif, DO  aspirin 81 MG EC tablet Take 81 mg by mouth every morning. 11/21/19  Yes [provider]  citalopram (CELEXA) 10 MG tablet Take 1 tablet (10 mg total) by mouth daily. 10/29/20  Yes [name], Betty G, MD  gabapentin (NEURONTIN) 100 MG capsule Take 1 capsule (100 mg total) by mouth at bedtime. 11/11/20  Yes Sheikh, Omair Latif, DO  hydroxychloroquine (PLAQUENIL) 200 MG tablet Take 1 tablet (200 mg total) by mouth 2 (two) times daily. 12/25/13  Yes Jegede, Olugbemiga  E, MD  isosorbide-hydrALAZINE (BIDIL) 20-37.5 MG tablet Take 1 tablet by mouth 3 (three) times daily. 08/26/20  Yes Debbe Odea, MD  metoprolol succinate (TOPROL-XL) 25 MG 24 hr tablet Take 1 tablet (25 mg total) by mouth daily. 10/29/20  Yes [name], Betty G, MD  nitroGLYCERIN (NITROSTAT) 0.4 MG SL tablet Place 0.4 mg under the tongue every 5 (five) minutes as needed for chest pain.    Yes [provider]  ondansetron (ZOFRAN) 4 MG tablet Take 1 tablet (4 mg total) by mouth every  6 (six) hours as needed for nausea. Patient taking differently: Take 4 mg by mouth every 6 (six) hours as needed for nausea or vomiting. 11/11/20  Yes Sheikh, Omair Latif, DO  pantoprazole (PROTONIX) 40 MG tablet Take 1 tablet (40 mg total) by mouth 2 (two) times daily before a meal. Patient taking differently: Take 40 mg by mouth 2 (two) times daily. 11/11/20  Yes Sheikh, Omair Latif, DO  predniSONE (DELTASONE) 5 MG tablet Take 5 mg by mouth daily. 05/16/19  Yes [provider]  rosuvastatin (CRESTOR) 5 MG tablet Take 5 mg by mouth daily at 6 PM. 05/02/19  Yes [provider]  simethicone (MYLICON) 80 MG chewable tablet Chew 80 mg by mouth every 6 (six) hours as needed for flatulence.   Yes [provider]  sodium bicarbonate 650 MG tablet Take 1 tablet (650 mg total) by mouth 3 (three) times daily. 11/11/20  Yes Sheikh, Omair Latif, DO  tacrolimus (PROGRAF) 0.5 MG capsule Take 1 capsule (0.5 mg total) by mouth daily. 11/12/20  Yes Sheikh, Omair Latif, DO  torsemide (DEMADEX) 20 MG tablet Take 20 mg by mouth 2 (two) times daily.   Yes [provider]  valGANciclovir (VALCYTE) 450 MG tablet Take 450 mg by mouth every other day.   Yes [provider]  tacrolimus (PROGRAF) 0.5 MG capsule Take 1 capsule (0.5 mg total) by mouth at bedtime. Patient not taking: No sig reported 11/11/20   Raiford Noble Latif, DO    Allergies    Enalapril maleate, Penicillins, Chocolate, and  Tape  Review of Systems   Review of Systems  Respiratory:  Positive for shortness of breath.   Cardiovascular:  Positive for leg swelling.  Gastrointestinal:  Positive for abdominal pain.  All other systems reviewed and are negative.  Physical Exam Updated Vital Signs BP 123/74 (BP Location: Right Arm)   Pulse 80   Temp 97.8 F (36.6 C) (Oral)   Resp 20   Ht $R'5\' 7"'Cf$  (1.702 m)   Wt 92.9 kg   SpO2 99%   BMI 32.06 kg/m   Physical Exam Vitals and nursing note reviewed.  Constitutional:      Appearance: Normal appearance.     Comments: Chronically ill   HENT:     Head: Normocephalic.     Nose: Nose normal.     Mouth/Throat:     Mouth: Mucous membranes are moist.  Eyes:     Extraocular Movements: Extraocular movements intact.     Pupils: Pupils are equal, round, and reactive to light.  Cardiovascular:     Rate and Rhythm: Normal rate and regular rhythm.     Pulses: Normal pulses.     Heart sounds: Normal heart sounds.  Pulmonary:     Comments: Crackles bilateral bases Abdominal:     General: Abdomen is flat.     Palpations: Abdomen is soft.  Musculoskeletal:     Cervical back: Normal range of motion and neck supple.     Comments: 1+ edema bilateral legs  Skin:    General: Skin is warm.     Capillary Refill: Capillary refill takes less than 2 seconds.  Neurological:     General: No focal deficit present.     Mental Status: She is alert and oriented to person, place, and time.  Psychiatric:        Mood and Affect: Mood normal.        Behavior: Behavior normal.    ED Results / Procedures / Treatments  Labs (all labs ordered are listed, but only abnormal results are displayed) Labs Reviewed  CBC WITH DIFFERENTIAL/PLATELET - Abnormal; Notable for the following components:      Result Value   WBC 1.8 (*)    Hemoglobin 11.7 (*)    RDW 21.8 (*)    Platelets 86 (*)    nRBC 1.6 (*)    Neutro Abs 1.0 (*)    Lymphs Abs 0.3 (*)    Abs Immature Granulocytes 0.27 (*)     All other components within normal limits  BRAIN NATRIURETIC PEPTIDE - Abnormal; Notable for the following components:   B Natriuretic Peptide >4,500.0 (*)    All other components within normal limits  URINALYSIS, ROUTINE W REFLEX MICROSCOPIC - Abnormal; Notable for the following components:   Ketones, ur 5 (*)    Protein, ur >=300 (*)    All other components within normal limits  COMPREHENSIVE METABOLIC PANEL - Abnormal; Notable for the following components:   CO2 19 (*)    BUN 88 (*)    Creatinine, Ser 3.59 (*)    Calcium 10.4 (*)    Alkaline Phosphatase 139 (*)    Total Bilirubin 1.5 (*)    GFR, Estimated 13 (*)    All other components within normal limits  I-STAT CHEM 8, ED - Abnormal; Notable for the following components:   BUN 97 (*)    Creatinine, Ser 3.80 (*)    TCO2 21 (*)    All other components within normal limits  TROPONIN I (HIGH SENSITIVITY) - Abnormal; Notable for the following components:   Troponin I (High Sensitivity) 145 (*)    All other components within normal limits  TROPONIN I (HIGH SENSITIVITY) - Abnormal; Notable for the following components:   Troponin I (High Sensitivity) 145 (*)    All other components within normal limits  TROPONIN I (HIGH SENSITIVITY) - Abnormal; Notable for the following components:   Troponin I (High Sensitivity) 131 (*)    All other components within normal limits  RESP PANEL BY RT-PCR (FLU A&B, COVID) [id number]  COMPREHENSIVE METABOLIC PANEL  LIPASE, BLOOD    EKG EKG Interpretation  Date/Time:  Thursday November 13 2020 14:33:07 EDT Ventricular Rate:  72 PR Interval:  52 QRS Duration: 139 QT Interval:  448 QTC Calculation: 491 R Axis:   -47 Text Interpretation: Sinus rhythm Atrial premature complex Short PR interval Left bundle branch block No significant change since last tracing Confirmed by Wandra Arthurs [phone]) on 11/13/2020 3:05:48 PM  Radiology DG Chest 2 View  Result Date: 11/13/2020 CLINICAL DATA:  Chest  pain and shortness of breath. Bilateral leg pain and swelling. EXAM: CHEST - 2 VIEW COMPARISON:  11/10/2020 FINDINGS: Persistent airspace opacities in the right middle lobe both lower lobes. The airspace opacity in the left lower lobe and in the right middle lobe is mildly worsened, with a reduced amount of aeration in the involved regions. There is at least mild enlargement of the cardiopericardial silhouette. Mild prominence of upper zone pulmonary vasculature may reflect pulmonary venous hypertension. Linear subsegmental atelectasis in the left mid lung appears stable. IMPRESSION: 1. Worsened bibasilar airspace opacities, suspicious for pneumonia or aspiration. 2. Cardiomegaly with pulmonary venous hypertension. Electronically Signed   By: Van Clines M.D.   On: 11/13/2020 14:54   CT HEAD WO CONTRAST (5MM)  Result Date: 11/13/2020 CLINICAL DATA:  Dizziness Left-sided weakness EXAM: CT HEAD WITHOUT CONTRAST TECHNIQUE: Contiguous axial images were obtained from the base of the skull through  the vertex without intravenous contrast. COMPARISON:  None. FINDINGS: Brain: No acute intracranial hemorrhage. No midline shift or hydrocephalus. Interval development of low-density in the right occipital lobe white matter. Vascular: No hyperdense vessel or unexpected calcification. Skull: Normal. Negative for fracture or focal lesion. Sinuses/Orbits: No acute finding. Other: None. IMPRESSION: 1. No acute intracranial hemorrhage. 2. Interval development of white matter hypodensity in the right occipital lobe. While this may be due to interval infarct, findings may also be the result of vasogenic edema related to underlying mass. Further evaluation with contrast-enhanced MRI of the brain should be performed when patient condition allows. Electronically Signed   By: Miachel Roux M.D.   On: 11/13/2020 15:51   MR Brain W and Wo Contrast  Result Date: 11/13/2020 CLINICAL DATA:  Dizziness and possible brain mass EXAM:  MRI HEAD WITHOUT AND WITH CONTRAST TECHNIQUE: Multiplanar, multiecho pulse sequences of the brain and surrounding structures were obtained without and with intravenous contrast. CONTRAST:  55mL GADAVIST GADOBUTROL 1 MMOL/ML IV SOLN COMPARISON:  Head CT 11/13/2020 Brain MRI 05/05/2013 FINDINGS: Brain: No acute infarct, mass effect or extra-axial collection. No acute or chronic hemorrhage. There is hyperintense T2-weighted signal in the posterior right parietal lobe, likely an old infarct. Mild periventricular white matter hyperintense T2-weighted signal. The midline structures are normal. Vascular: Major flow voids are preserved. Skull and upper cervical spine: Normal calvarium and skull base. Visualized upper cervical spine and soft tissues are normal. Sinuses/Orbits:No paranasal sinus fluid levels or advanced mucosal thickening. No mastoid or middle ear effusion. Normal orbits. IMPRESSION: 1. No acute intracranial abnormality.  No mass lesion. 2. 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 Electronically Signed   By: Ulyses Jarred M.D.   On: 11/13/2020 21:25   CT Renal Stone Study  Result Date: 11/13/2020 CLINICAL DATA:  Bilateral leg pain and swelling that began 2-3 hours, abdominal pain, nausea EXAM: CT ABDOMEN AND PELVIS WITHOUT CONTRAST TECHNIQUE: Multidetector CT imaging of the abdomen and pelvis was performed following the standard protocol without IV contrast. COMPARISON:  CT abdomen/pelvis 11/01/2020, MR abdomen 11/03/2020 FINDINGS: Lower chest: There are bilateral pleural effusions, increased since 11/01/2020. Patchy opacities in the left lower lobe are increased since the prior study are suspicious for infection or aspiration. The heart is enlarged. Coronary artery calcifications are again seen. Hepatobiliary: The liver is unremarkable, within the confines of noncontrast technique. The previously reported abnormality seen on the abdominal ultrasound from  11/01/2020 is not identified. The gallbladder is contracted but again demonstrates a thickened wall and mild pericholecystic fat stranding. Pancreas: Unremarkable. Spleen: Unremarkable. Adrenals/Urinary Tract: The adrenals are unremarkable. The native kidneys are atrophic, with no hydronephrosis or hydroureter. A transplant kidney is again seen in the right lower quadrant. There is prominence of the transplant ureter and pelvis with mild stranding in the surrounding fat. There are no focal lesions or stones seen in the transplant kidney or ureter. Overall, the findings are similar to the study from 11/01/2020. The bladder is decompressed but grossly unremarkable. Stomach/Bowel: The stomach is unremarkable. There is no evidence of bowel obstruction. There is no evidence of abnormal bowel wall thickening or inflammatory change. There is fluid in the distal colon/rectum. There are scattered colonic diverticula without evidence of acute diverticulitis. Vascular/Lymphatic: There is calcified atherosclerotic plaque throughout the nonaneurysmal abdominal aorta. There is no abdominal or pelvic lymphadenopathy. Reproductive: A calcification in the uterus likely reflects a fibroid. There is no adnexal mass. Other: There is trace free fluid  in the pelvis, nonspecific and not significantly changed. There is no free air. There is diffuse body wall edema, similar to the prior study. Musculoskeletal: There is no acute osseous abnormality or aggressive osseous lesion. IMPRESSION: 1. Overall, no new acute finding in the abdomen or pelvis compared to the study from 11/01/2020. 2. Similar appearance of the transplant kidney in the right lower quadrant with unchanged prominence of the pelvis and ureter with no stone or other obstructing lesion seen. There is mild stranding in the surrounding fat, also similar to the prior study. Correlate with symptoms and laboratory values if there is concern for pyelonephritis. 3. The gallbladder  again demonstrates mild wall thickening and pericholecystic fat stranding. This is again nonspecific and could be due to heart failure. This could be evaluated with HIDA scan if there is clinical concern for acute cholecystitis. 4. Bilateral pleural effusions, increased since 11/01/2020, with increased patchy opacities in the left base suspicious for infection or aspiration. 5. Fluid in the colon which can be seen in the setting of diarrhea. 6. Diffuse body wall edema and trace free fluid in the pelvis, similar to the prior study. 7. Unchanged cardiomegaly. 8.  Aortic Atherosclerosis (ICD10-I70.0). Electronically Signed   By: Valetta Mole M.D.   On: 11/13/2020 15:59    Procedures Procedures   Medications Ordered in ED Medications  morphine 4 MG/ML injection 4 mg (4 mg Intravenous Given 11/13/20 1543)  furosemide (LASIX) injection 40 mg (40 mg Intravenous Given 11/13/20 1658)  LORazepam (ATIVAN) injection 2 mg (2 mg Intravenous Given 11/13/20 1659)  gadobutrol (GADAVIST) 1 MMOL/ML injection 10 mL (10 mLs Intravenous Contrast Given 11/13/20 2010)    ED Course  I have reviewed the triage vital signs and the nursing notes.  Pertinent labs & imaging results that were available during my care of the patient were reviewed by me and considered in my medical decision making (see chart for details).    MDM Rules/Calculators/A&P                           Caitland P Giovannini is a 65 y.o. female here with left-sided weakness, persistent abdominal pain and shortness of breath.  Patient was just discharged on the hospital to go to nursing home.  She appears volume overloaded. She just finished antibiotics for UTI.  Consider recurrent UTI versus worsening heart failure versus stroke.  We will get CT head and labs and troponin and BNP.  9:49 PM Initial troponin was 145 downtrending to 131.  BNP is still greater than 4500.  Patient's CT abdomen pelvis unchanged from previous. Patient has possible infarct vs mass on  CT head with vasogenic edema. MRI brain showed no mass or stroke.  She was given Lasix for heart failure.  At this point will readmit her for heart failure exacerbation.  UA showed no UTI  Final Clinical Impression(s) / ED Diagnoses Final diagnoses:  None    Rx / DC Orders ED Discharge Orders     None        Drenda Freeze, MD 11/13/20 2149

## 2020-11-13 NOTE — Telephone Encounter (Signed)
Transition Care Management Unsuccessful Follow-up Telephone Call  Date of discharge and from where:  Jenna [name] 11/12/2020  Attempts:  3rd Attempt  Reason for unsuccessful TCM follow-up call:  Unable to reach patient

## 2020-11-13 NOTE — ED Notes (Signed)
Pt required staff assist with stand and pivot transfer x2. Pt required majority of staff assist to stay standing.

## 2020-11-13 NOTE — ED Notes (Signed)
I recommended pt to have purewick due to limited mobility, pt refused stating she insisted she can stand and pivot to bedside commode.

## 2020-11-13 NOTE — ED Provider Notes (Signed)
Emergency Medicine Provider Triage Evaluation Note  Jenna [name] , a 65 y.o. female  was evaluated in triage.  Pt complains of multiple complaints - she is complaining of b/l leg pain and swelling that began about 2-3 hours ago. Pt also complains of abdominal pain and nausea.She feels like her fingers on her R hand are numb. Also complains of chest pain. Apparently she received 324 mg ASA and 0.4 mg NTG - relieved. She is on oxygen currently but states she is not normally on oxygen. She is a very poor historian.  Review of Systems  Positive: + leg pain/swelling, chest pain, SOB, nausea, abdominal pain Negative:   Physical Exam  BP (!) 123/93   Pulse 75   Temp 97.8 F (36.6 C) (Oral)   Resp 18   Ht $R'5\' 7"'wh$  (1.702 m)   Wt 92.9 kg   SpO2 96%   BMI 32.06 kg/m  Gen:   Awake, no distress   Resp:  Normal effort  MSK:   Moves extremities without difficulty  Other:  1+ pitting edema bilaterally. Sensation intact to R hand, strength intact. Mild abdominal TTP to epigastrium mostly.   Medical Decision Making  Medically screening exam initiated at 2:05 PM.  Appropriate orders placed.  Jenna [name] was informed that the remainder of the evaluation will be completed by another provider, this initial triage assessment does not replace that evaluation, and the importance of remaining in the ED until their evaluation is complete.     Eustaquio Maize, PA-C 11/13/20 1409    Lacretia Leigh, MD 11/16/20 [phone]

## 2020-11-14 ENCOUNTER — Encounter (HOSPITAL_COMMUNITY): Payer: Self-pay | Admitting: Internal Medicine

## 2020-11-14 DIAGNOSIS — G9341 Metabolic encephalopathy: Secondary | ICD-10-CM | POA: Diagnosis present

## 2020-11-14 DIAGNOSIS — E1122 Type 2 diabetes mellitus with diabetic chronic kidney disease: Secondary | ICD-10-CM | POA: Diagnosis present

## 2020-11-14 DIAGNOSIS — M3214 Glomerular disease in systemic lupus erythematosus: Secondary | ICD-10-CM | POA: Diagnosis present

## 2020-11-14 DIAGNOSIS — N2581 Secondary hyperparathyroidism of renal origin: Secondary | ICD-10-CM | POA: Diagnosis present

## 2020-11-14 DIAGNOSIS — I130 Hypertensive heart and chronic kidney disease with heart failure and stage 1 through stage 4 chronic kidney disease, or unspecified chronic kidney disease: Secondary | ICD-10-CM | POA: Diagnosis present

## 2020-11-14 DIAGNOSIS — J189 Pneumonia, unspecified organism: Secondary | ICD-10-CM | POA: Diagnosis present

## 2020-11-14 DIAGNOSIS — N185 Chronic kidney disease, stage 5: Secondary | ICD-10-CM | POA: Diagnosis not present

## 2020-11-14 DIAGNOSIS — I248 Other forms of acute ischemic heart disease: Secondary | ICD-10-CM | POA: Diagnosis present

## 2020-11-14 DIAGNOSIS — I959 Hypotension, unspecified: Secondary | ICD-10-CM | POA: Diagnosis present

## 2020-11-14 DIAGNOSIS — I480 Paroxysmal atrial fibrillation: Secondary | ICD-10-CM | POA: Diagnosis not present

## 2020-11-14 DIAGNOSIS — N179 Acute kidney failure, unspecified: Secondary | ICD-10-CM | POA: Diagnosis present

## 2020-11-14 DIAGNOSIS — I2510 Atherosclerotic heart disease of native coronary artery without angina pectoris: Secondary | ICD-10-CM

## 2020-11-14 DIAGNOSIS — I129 Hypertensive chronic kidney disease with stage 1 through stage 4 chronic kidney disease, or unspecified chronic kidney disease: Secondary | ICD-10-CM

## 2020-11-14 DIAGNOSIS — I509 Heart failure, unspecified: Secondary | ICD-10-CM | POA: Diagnosis not present

## 2020-11-14 DIAGNOSIS — E872 Acidosis, unspecified: Secondary | ICD-10-CM | POA: Diagnosis present

## 2020-11-14 DIAGNOSIS — E669 Obesity, unspecified: Secondary | ICD-10-CM | POA: Diagnosis present

## 2020-11-14 DIAGNOSIS — D631 Anemia in chronic kidney disease: Secondary | ICD-10-CM | POA: Diagnosis present

## 2020-11-14 DIAGNOSIS — Z7189 Other specified counseling: Secondary | ICD-10-CM | POA: Diagnosis not present

## 2020-11-14 DIAGNOSIS — D61818 Other pancytopenia: Secondary | ICD-10-CM | POA: Diagnosis present

## 2020-11-14 DIAGNOSIS — J690 Pneumonitis due to inhalation of food and vomit: Secondary | ICD-10-CM | POA: Diagnosis present

## 2020-11-14 DIAGNOSIS — Z20822 Contact with and (suspected) exposure to covid-19: Secondary | ICD-10-CM | POA: Diagnosis present

## 2020-11-14 DIAGNOSIS — N289 Disorder of kidney and ureter, unspecified: Secondary | ICD-10-CM | POA: Diagnosis present

## 2020-11-14 DIAGNOSIS — D72819 Decreased white blood cell count, unspecified: Secondary | ICD-10-CM | POA: Diagnosis not present

## 2020-11-14 DIAGNOSIS — F32A Depression, unspecified: Secondary | ICD-10-CM | POA: Diagnosis present

## 2020-11-14 DIAGNOSIS — I5020 Unspecified systolic (congestive) heart failure: Secondary | ICD-10-CM

## 2020-11-14 DIAGNOSIS — N184 Chronic kidney disease, stage 4 (severe): Secondary | ICD-10-CM | POA: Diagnosis present

## 2020-11-14 DIAGNOSIS — Z6833 Body mass index (BMI) 33.0-33.9, adult: Secondary | ICD-10-CM | POA: Diagnosis not present

## 2020-11-14 DIAGNOSIS — Z515 Encounter for palliative care: Secondary | ICD-10-CM | POA: Diagnosis not present

## 2020-11-14 DIAGNOSIS — E1142 Type 2 diabetes mellitus with diabetic polyneuropathy: Secondary | ICD-10-CM | POA: Diagnosis present

## 2020-11-14 DIAGNOSIS — I5023 Acute on chronic systolic (congestive) heart failure: Secondary | ICD-10-CM

## 2020-11-14 DIAGNOSIS — G459 Transient cerebral ischemic attack, unspecified: Secondary | ICD-10-CM

## 2020-11-14 DIAGNOSIS — Z940 Kidney transplant status: Secondary | ICD-10-CM | POA: Diagnosis not present

## 2020-11-14 DIAGNOSIS — D638 Anemia in other chronic diseases classified elsewhere: Secondary | ICD-10-CM

## 2020-11-14 DIAGNOSIS — B259 Cytomegaloviral disease, unspecified: Secondary | ICD-10-CM | POA: Diagnosis present

## 2020-11-14 DIAGNOSIS — G8194 Hemiplegia, unspecified affecting left nondominant side: Secondary | ICD-10-CM | POA: Diagnosis present

## 2020-11-14 LAB — CBC WITH DIFFERENTIAL/PLATELET
Abs Immature Granulocytes: 0.14 K/uL — ABNORMAL HIGH (ref 0.00–0.07)
Basophils Absolute: 0 K/uL (ref 0.0–0.1)
Basophils Relative: 1 %
Eosinophils Absolute: 0 K/uL (ref 0.0–0.5)
Eosinophils Relative: 1 %
HCT: 37.1 % (ref 36.0–46.0)
Hemoglobin: 11.9 g/dL — ABNORMAL LOW (ref 12.0–15.0)
Immature Granulocytes: 10 %
Lymphocytes Relative: 14 %
Lymphs Abs: 0.2 K/uL — ABNORMAL LOW (ref 0.7–4.0)
MCH: 29.8 pg (ref 26.0–34.0)
MCHC: 32.1 g/dL (ref 30.0–36.0)
MCV: 93 fL (ref 80.0–100.0)
Monocytes Absolute: 0.2 K/uL (ref 0.1–1.0)
Monocytes Relative: 12 %
Neutro Abs: 0.9 K/uL — ABNORMAL LOW (ref 1.7–7.7)
Neutrophils Relative %: 62 %
Platelets: 71 K/uL — ABNORMAL LOW (ref 150–400)
RBC: 3.99 MIL/uL (ref 3.87–5.11)
RDW: 22.4 % — ABNORMAL HIGH (ref 11.5–15.5)
WBC: 1.4 K/uL — CL (ref 4.0–10.5)
nRBC: 1.4 % — ABNORMAL HIGH (ref 0.0–0.2)

## 2020-11-14 LAB — BLOOD GAS, VENOUS
Acid-base deficit: 7.2 mmol/L — ABNORMAL HIGH (ref 0.0–2.0)
Bicarbonate: 17.8 mmol/L — ABNORMAL LOW (ref 20.0–28.0)
O2 Saturation: 63.3 %
Patient temperature: 98.6
pCO2, Ven: 36.3 mmHg — ABNORMAL LOW (ref 44.0–60.0)
pH, Ven: 7.312 (ref 7.250–7.430)
pO2, Ven: 40.6 mmHg (ref 32.0–45.0)

## 2020-11-14 LAB — LACTIC ACID, PLASMA: Lactic Acid, Venous: 2.3 mmol/L (ref 0.5–1.9)

## 2020-11-14 LAB — PROCALCITONIN: Procalcitonin: 0.31 ng/mL

## 2020-11-14 LAB — PHOSPHORUS: Phosphorus: 5.6 mg/dL — ABNORMAL HIGH (ref 2.5–4.6)

## 2020-11-14 LAB — TROPONIN I (HIGH SENSITIVITY): Troponin I (High Sensitivity): 105 ng/L

## 2020-11-14 LAB — CK: Total CK: 55 U/L (ref 38–234)

## 2020-11-14 LAB — COMPREHENSIVE METABOLIC PANEL WITH GFR
ALT: 16 U/L (ref 0–44)
AST: 27 U/L (ref 15–41)
Albumin: 3.2 g/dL — ABNORMAL LOW (ref 3.5–5.0)
Alkaline Phosphatase: 125 U/L (ref 38–126)
Anion gap: 15 (ref 5–15)
BUN: 85 mg/dL — ABNORMAL HIGH (ref 8–23)
CO2: 18 mmol/L — ABNORMAL LOW (ref 22–32)
Calcium: 9.8 mg/dL (ref 8.9–10.3)
Chloride: 104 mmol/L (ref 98–111)
Creatinine, Ser: 3.54 mg/dL — ABNORMAL HIGH (ref 0.44–1.00)
GFR, Estimated: 14 mL/min — ABNORMAL LOW
Glucose, Bld: 79 mg/dL (ref 70–99)
Potassium: 4.3 mmol/L (ref 3.5–5.1)
Sodium: 137 mmol/L (ref 135–145)
Total Bilirubin: 1.4 mg/dL — ABNORMAL HIGH (ref 0.3–1.2)
Total Protein: 6.9 g/dL (ref 6.5–8.1)

## 2020-11-14 LAB — MAGNESIUM: Magnesium: 2.6 mg/dL — ABNORMAL HIGH (ref 1.7–2.4)

## 2020-11-14 LAB — TACROLIMUS LEVEL: Tacrolimus (FK506) - LabCorp: 11.4 ng/mL (ref 2.0–20.0)

## 2020-11-14 MED ORDER — TACROLIMUS 0.5 MG PO CAPS
0.5000 mg | ORAL_CAPSULE | Freq: Two times a day (BID) | ORAL | Status: DC
  Administered 2020-11-14 – 2020-11-24 (×20): 0.5 mg via ORAL
  Filled 2020-11-14 (×22): qty 1

## 2020-11-14 MED ORDER — MORPHINE SULFATE (PF) 2 MG/ML IV SOLN: 0.5000 mg | Freq: Once | INTRAVENOUS | Status: DC

## 2020-11-14 MED ORDER — CEFDINIR 300 MG PO CAPS
300.0000 mg | ORAL_CAPSULE | ORAL | Status: AC
Start: 2020-11-14 — End: 2020-11-18
  Administered 2020-11-14 – 2020-11-18 (×5): 300 mg via ORAL
  Filled 2020-11-14 (×6): qty 1

## 2020-11-14 MED ORDER — AZITHROMYCIN 250 MG PO TABS
500.0000 mg | ORAL_TABLET | Freq: Every day | ORAL | Status: AC
  Administered 2020-11-15 – 2020-11-18 (×4): 500 mg via ORAL
  Filled 2020-11-14 (×4): qty 2

## 2020-11-14 MED ORDER — MORPHINE SULFATE (PF) 2 MG/ML IV SOLN
0.5000 mg | INTRAVENOUS | Status: AC | PRN
Start: 2020-11-14 — End: 2020-11-14
  Administered 2020-11-14: 0.5 mg via INTRAVENOUS
  Filled 2020-11-14: qty 1

## 2020-11-14 MED ORDER — PREDNISONE 5 MG PO TABS
5.0000 mg | ORAL_TABLET | Freq: Every day | ORAL | Status: DC
  Administered 2020-11-14 – 2020-11-24 (×11): 5 mg via ORAL
  Filled 2020-11-14 (×11): qty 1

## 2020-11-14 MED ORDER — TACROLIMUS 0.5 MG PO CAPS
0.5000 mg | ORAL_CAPSULE | Freq: Two times a day (BID) | ORAL | Status: DC
  Administered 2020-11-14: 0.5 mg via ORAL
  Filled 2020-11-14: qty 1

## 2020-11-14 MED ORDER — ROSUVASTATIN CALCIUM 5 MG PO TABS
5.0000 mg | ORAL_TABLET | Freq: Every day | ORAL | Status: DC
  Administered 2020-11-14 – 2020-11-23 (×10): 5 mg via ORAL
  Filled 2020-11-14 (×10): qty 1

## 2020-11-14 NOTE — Progress Notes (Signed)
[facility] PROGRESS NOTE   Jenna [name] SJG:[id number] DOB: 06-21-1955 DOA: 11/13/2020  PCP: [name], Betty G, MD  Brief History/Interval Summary: Jenna [name] is a 65 y.o. female with medical history significant of anemia, CAD, depression, chronic kidney disease stage IV, status post renal transplant on immunosuppressants, hypertension, focal segmental glomerularsclerosis, systolic CHF EF 65-46, lupus nephritis, ischemic cardiomyopathy, history of LA thrombus, obesity, paroxysmal atrial fibrillation, CVA of parietal lobe, known CAD status post DES to LAD in September 2020.  She was admitted earlier this month for acute kidney injury, pyelonephritis, Pseudomonas UTI, melanotic stools and acute systolic CHF.  Also was noted to have large right-sided pleural effusion.  She was stabilized and then discharged to a skilled nursing facility on 9/28.  Patient presented with complaints of bilateral leg pain and swelling, abdominal pain nausea chest pain.  She felt that she had not really improved.   Consultants: Cardiology.  Nephrology  Procedures: None yet    Subjective/Interval History: Patient is a very poor historian.  She is noted to be lethargic but easily arousable.  She complains of pain all over but especially in her right upper extremity where her IV infiltrated last night.     Assessment/Plan:  Acute on chronic systolic CHF EF is known to be about 20 to 25%.  Patient is followed by cardiology and [facility].  She was noted to have bilateral lower extremity edema and JVD.  She was given furosemide in the emergency department and was continued.  Cardiology has been consulted.  Strict ins and outs.  Monitor daily weights. Mildly elevated troponin levels noted likely due to CHF/demand ischemia. Monitor renal function closely.  Nephrology to assist with management. No ACE inhibitor or ARB due to renal failure.  Noted to be on BiDil.  Chronic kidney  disease stage IV/history of renal transplant Creatinine noted to be close to what it was when she was recently hospitalized.  Monitor urine output.  Patient is on bicarbonate tablets.  She is noted to be on Prograf.  Supposed to be on prednisone as well.  We will request nephrology to assist. Patient also on valganciclovir for history of CMV.  Pneumonia, aspiration versus community-acquired Noted to be on ceftriaxone and azithromycin.  Acute metabolic encephalopathy Likely due to her several comorbidities.  No acute findings noted in terms of neurological status.  She did undergo MRI of the brain which did not show any acute infarcts.  Reorient daily.  Will eventually need PT and OT evaluation.  History of paroxysmal atrial fibrillation Beta-blocker on hold due to bradycardia.  Home medication list does not show that she is on anticoagulation.  Noted to be just on aspirin.  Looks like she was on Eliquis previously but was not continued due to concern for GI bleed.  Thrombocytopenia Chronic and stable.  No evidence of overt bleeding.  History of SLE with history of lupus nephritis Noted to be on Plaquenil which is being continued.  Hypertension in the setting of chronic kidney disease stage IV  Metoprolol on hold.  Monitor blood pressures closely.  History of coronary artery disease Followed at [facility].  Continue aspirin.  Was also on rosuvastatin prior to admission which can be resumed.  Anemia of chronic disease No evidence of overt bleeding currently.  Monitor hemoglobin.  Recent Pseudomonas UTI/pyelonephritis involving transplanted kidney Recently completed 7-day course of IV antibiotics with meropenem.  Leukopenia Chronic for the most part.  Continue to monitor for now.  Poor venous access Her IV in the right arm infiltrated while she was getting azithromycin.  The arm is swollen.  Will request nursing staff to apply warm compresses.  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  Venous access could become an issue.  May need to consider central line.  Will discuss with nephrology.  Obesity Estimated body mass index is 32.63 kg/m as calculated from the following:   Height as of this encounter: $RemoveBeforeD'5\' 7"'awzhcTKnoPxJmB$  (1.702 m).   Weight as of this encounter: 94.5 kg.    DVT Prophylaxis: SCDs for now Code Status: Full code Family Communication: Discussed with patient.  No family at bedside Disposition Plan: From SNF  Status is: Observation  The patient will require care spanning > 2 midnights and should be moved to inpatient because: Ongoing diagnostic testing needed not appropriate for outpatient work up, IV treatments appropriate due to intensity of illness or inability to take PO, and Inpatient level of care appropriate due to severity of illness  Dispo: The patient is from: SNF              Anticipated d/c is to: SNF              Patient currently is not medically stable to d/c.   Difficult to place patient No       Medications: Scheduled:  aspirin EC  81 mg Oral q morning   citalopram  10 mg Oral Daily   furosemide  60 mg Intravenous BID   hydroxychloroquine  200 mg Oral BID   isosorbide-hydrALAZINE  1 tablet Oral TID   pantoprazole  40 mg Oral BID   sodium bicarbonate  650 mg Oral TID   sodium chloride flush  3 mL Intravenous Q12H   tacrolimus  0.5 mg Oral Daily   [START ON 11/15/2020] valGANciclovir  450 mg Oral QODAY   Continuous:  sodium chloride     azithromycin 500 mg (11/14/20 0119)   cefTRIAXone (ROCEPHIN)  IV 2 g (11/14/20 0040)   QJJ:HERDEY chloride, acetaminophen **OR** acetaminophen, albuterol, ALPRAZolam, sodium chloride flush  Antibiotics: Anti-infectives (From admission, onward)    Start     Dose/Rate Route Frequency Ordered Stop   11/15/20 1000  valGANciclovir (VALCYTE) 450 MG tablet TABS 450 mg        450 mg Oral Every other day 11/13/20 2328     11/14/20 0015  hydroxychloroquine (PLAQUENIL) tablet 200 mg        200 mg Oral  2 times daily 11/13/20 2328     11/13/20 2300  cefTRIAXone (ROCEPHIN) 2 g in sodium chloride 0.9 % 100 mL IVPB        2 g 200 mL/hr over 30 Minutes Intravenous Every 24 hours 11/13/20 2230 11/18/20 2259   11/13/20 2300  azithromycin (ZITHROMAX) 500 mg in sodium chloride 0.9 % 250 mL IVPB        500 mg 250 mL/hr over 60 Minutes Intravenous Every 24 hours 11/13/20 2230 11/18/20 2259       Objective:  Vital Signs  Vitals:   11/14/20 0313 11/14/20 0352 11/14/20 0400 11/14/20 0925  BP:  (!) 144/82  132/80  Pulse:    (!) 55  Resp:  $Remo'13 13 20  'lnwYV$ Temp:  97.7 F (36.5 C)  97.6 F (36.4 C)  TempSrc:  Oral    SpO2:  98%  98%  Weight: 94.5 kg     Height:        Intake/Output Summary (Last 24 hours)  at 11/14/2020 0928 Last data filed at 11/14/2020 0041 Gross per 24 hour  Intake 30 ml  Output --  Net 30 ml   Filed Weights   11/13/20 1336 11/14/20 0313  Weight: 92.9 kg 94.5 kg    General appearance: Awake alert.  In no distress.  Lethargic Resp: Diminished air entry at the bases with few crackles bilaterally.  No wheezing or rhonchi. Cardio: S1-S2 is normal regular.  No S3-S4.  No rubs murmurs or bruit GI: Abdomen is soft.  Nontender nondistended.  Bowel sounds are present normal.  No masses organomegaly Extremities: Right upper extremity swollen from recent IV infiltration.  Good radial pulses. Neurologic:   No focal neurological deficits.  Physical deconditioning is noted.   Lab Results:  Data Reviewed: I have personally reviewed following labs and imaging studies  CBC: Recent Labs  Lab 11/09/20 0539 11/10/20 0223 11/11/20 0601 11/13/20 1442 11/13/20 1623 11/14/20 0548  WBC 2.8* 2.3* 2.2* 1.8*  --  1.4*  NEUTROABS 2.0 1.6* 1.3* 1.0*  --  0.9*  HGB 11.5* 11.0* 10.8* 11.7* 13.9 11.9*  HCT 35.8* 33.1* 33.1* 36.2 41.0 37.1  MCV 91.6 89.9 90.4 91.4  --  93.0  PLT 69* 71* 65* 86*  --  71*    Basic Metabolic Panel: Recent Labs  Lab 11/08/20 0559 11/09/20 0539  11/10/20 0223 11/11/20 0601 11/11/20 1044 11/13/20 1442 11/13/20 1612 11/13/20 1623 11/14/20 0548  NA 133* 132* 134* 133* 133* SPECIMEN HEMOLYZED. HEMOLYSIS MAY AFFECT INTEGRITY OF RESULTS. 140 136 137  K 4.3 4.2 4.4 6.1* 4.7 SPECIMEN HEMOLYZED. HEMOLYSIS MAY AFFECT INTEGRITY OF RESULTS. 4.2 4.8 4.3  CL 105 102 103 104 104 SPECIMEN HEMOLYZED. HEMOLYSIS MAY AFFECT INTEGRITY OF RESULTS. 110 110 104  CO2 17* 19* 18* 17* 15* SPECIMEN HEMOLYZED. HEMOLYSIS MAY AFFECT INTEGRITY OF RESULTS. 19*  --  18*  GLUCOSE 90 91 78 82 75 SPECIMEN HEMOLYZED. HEMOLYSIS MAY AFFECT INTEGRITY OF RESULTS. 97 92 79  BUN 77* 74* 75* 80* 79* SPECIMEN HEMOLYZED. HEMOLYSIS MAY AFFECT INTEGRITY OF RESULTS. 88* 97* 85*  CREATININE 3.79* 3.62* 3.40* 3.53* 3.47* SPECIMEN HEMOLYZED. HEMOLYSIS MAY AFFECT INTEGRITY OF RESULTS. 3.59* 3.80* 3.54*  CALCIUM 9.0 9.3 9.3 9.4 9.7 SPECIMEN HEMOLYZED. HEMOLYSIS MAY AFFECT INTEGRITY OF RESULTS. 10.4*  --  9.8  MG  --  2.5* 2.4 2.5*  --   --   --   --  2.6*  PHOS 4.8* 4.5 4.6 4.8*  --   --   --   --  5.6*    GFR: Estimated Creatinine Clearance: 18.7 mL/min (A) (by C-G formula based on SCr of 3.54 mg/dL (H)).  Liver Function Tests: Recent Labs  Lab 11/10/20 0223 11/11/20 0601 11/13/20 1442 11/13/20 1612 11/14/20 0548  AST 24 46* SPECIMEN HEMOLYZED. HEMOLYSIS MAY AFFECT INTEGRITY OF RESULTS. 32 27  ALT 20 19 SPECIMEN HEMOLYZED. HEMOLYSIS MAY AFFECT INTEGRITY OF RESULTS. 18 16  ALKPHOS 93 88 SPECIMEN HEMOLYZED. HEMOLYSIS MAY AFFECT INTEGRITY OF RESULTS. 139* 125  BILITOT 1.5* 1.1 SPECIMEN HEMOLYZED. HEMOLYSIS MAY AFFECT INTEGRITY OF RESULTS. 1.5* 1.4*  PROT 6.3* 6.0* SPECIMEN HEMOLYZED. HEMOLYSIS MAY AFFECT INTEGRITY OF RESULTS. 7.6 6.9  ALBUMIN 2.9* 2.8* SPECIMEN HEMOLYZED. HEMOLYSIS MAY AFFECT INTEGRITY OF RESULTS. 3.5 3.2*    Recent Labs  Lab 11/13/20 1612  LIPASE 22     Cardiac Enzymes: Recent Labs  Lab 11/14/20 0548  CKTOTAL 55      Recent Results (from the  past 240 hour(s))  SARS CORONAVIRUS 2 (TAT 6-24  HRS) Nasopharyngeal Nasopharyngeal Swab     Status: None   Collection Time: 11/11/20 12:51 PM   Specimen: Nasopharyngeal Swab  Result Value Ref Range Status   SARS Coronavirus 2 NEGATIVE NEGATIVE Final    Comment: (NOTE) SARS-CoV-2 target nucleic acids are NOT DETECTED.  The SARS-CoV-2 RNA is generally detectable in upper and lower respiratory specimens during the acute phase of infection. Negative results do not preclude SARS-CoV-2 infection, do not rule out co-infections with other pathogens, and should not be used as the sole basis for treatment or other patient management decisions. Negative results must be combined with clinical observations, patient history, and epidemiological information. The expected result is Negative.  Fact Sheet for Patients: SugarRoll.be  Fact Sheet for Healthcare Providers: https://www.woods-mathews.com/  This test is not yet approved or cleared by the [place] FDA and  has been authorized for detection and/or diagnosis of SARS-CoV-2 by FDA under an Emergency Use Authorization (EUA). This EUA will remain  in effect (meaning this test can be used) for the duration of the COVID-19 declaration under Se ction 564(b)(1) of the Act, 21 U.S.C. section 360bbb-3(b)(1), unless the authorization is terminated or revoked sooner.  Performed at [facility], [place] [address] [place], [place] [address]   Resp Panel by RT-PCR (Flu A&B, Covid) Nasopharyngeal Swab     Status: None   Collection Time: 11/13/20  5:49 PM   Specimen: Nasopharyngeal Swab; Nasopharyngeal(NP) swabs in vial transport medium  Result Value Ref Range Status   SARS Coronavirus 2 by RT PCR NEGATIVE NEGATIVE Final    Comment: (NOTE) SARS-CoV-2 target nucleic acids are NOT DETECTED.  The SARS-CoV-2 RNA is generally detectable in upper respiratory specimens during the acute phase of infection. The  lowest concentration of SARS-CoV-2 viral copies this assay can detect is 138 copies/mL. A negative result does not preclude SARS-Cov-2 infection and should not be used as the sole basis for treatment or other patient management decisions. A negative result may occur with  improper specimen collection/handling, submission of specimen other than nasopharyngeal swab, presence of viral mutation(s) within the areas targeted by this assay, and inadequate number of viral copies(<138 copies/mL). A negative result must be combined with clinical observations, patient history, and epidemiological information. The expected result is Negative.  Fact Sheet for Patients:  EntrepreneurPulse.com.au  Fact Sheet for Healthcare Providers:  IncredibleEmployment.be  This test is no t yet approved or cleared by the [place] FDA and  has been authorized for detection and/or diagnosis of SARS-CoV-2 by FDA under an Emergency Use Authorization (EUA). This EUA will remain  in effect (meaning this test can be used) for the duration of the COVID-19 declaration under Section 564(b)(1) of the Act, 21 U.S.C.section 360bbb-3(b)(1), unless the authorization is terminated  or revoked sooner.       Influenza A by PCR NEGATIVE NEGATIVE Final   Influenza B by PCR NEGATIVE NEGATIVE Final    Comment: (NOTE) The Xpert Xpress SARS-CoV-2/FLU/RSV plus assay is intended as an aid in the diagnosis of influenza from Nasopharyngeal swab specimens and should not be used as a sole basis for treatment. Nasal washings and aspirates are unacceptable for Xpert Xpress SARS-CoV-2/FLU/RSV testing.  Fact Sheet for Patients: EntrepreneurPulse.com.au  Fact Sheet for Healthcare Providers: IncredibleEmployment.be  This test is not yet approved or cleared by the [place] FDA and has been authorized for detection and/or diagnosis of SARS-CoV-2 by FDA under  an Emergency Use Authorization (EUA). This EUA will remain in effect (meaning this  test can be used) for the duration of the COVID-19 declaration under Section 564(b)(1) of the Act, 21 U.S.C. section 360bbb-3(b)(1), unless the authorization is terminated or revoked.  Performed at [facility], [place] [address], [place],  [address]       Radiology Studies: DG Chest 2 View  Result Date: 11/13/2020 CLINICAL DATA:  Chest pain and shortness of breath. Bilateral leg pain and swelling. EXAM: CHEST - 2 VIEW COMPARISON:  11/10/2020 FINDINGS: Persistent airspace opacities in the right middle lobe both lower lobes. The airspace opacity in the left lower lobe and in the right middle lobe is mildly worsened, with a reduced amount of aeration in the involved regions. There is at least mild enlargement of the cardiopericardial silhouette. Mild prominence of upper zone pulmonary vasculature may reflect pulmonary venous hypertension. Linear subsegmental atelectasis in the left mid lung appears stable. IMPRESSION: 1. Worsened bibasilar airspace opacities, suspicious for pneumonia or aspiration. 2. Cardiomegaly with pulmonary venous hypertension. Electronically Signed   By: Van Clines M.D.   On: 11/13/2020 14:54   CT HEAD WO CONTRAST (5MM)  Result Date: 11/13/2020 CLINICAL DATA:  Dizziness Left-sided weakness EXAM: CT HEAD WITHOUT CONTRAST TECHNIQUE: Contiguous axial images were obtained from the base of the skull through the vertex without intravenous contrast. COMPARISON:  None. FINDINGS: Brain: No acute intracranial hemorrhage. No midline shift or hydrocephalus. Interval development of low-density in the right occipital lobe white matter. Vascular: No hyperdense vessel or unexpected calcification. Skull: Normal. Negative for fracture or focal lesion. Sinuses/Orbits: No acute finding. Other: None. IMPRESSION: 1. No acute intracranial hemorrhage. 2. Interval development of white  matter hypodensity in the right occipital lobe. While this may be due to interval infarct, findings may also be the result of vasogenic edema related to underlying mass. Further evaluation with contrast-enhanced MRI of the brain should be performed when patient condition allows. Electronically Signed   By: Miachel Roux M.D.   On: 11/13/2020 15:51   MR Brain W and Wo Contrast  Result Date: 11/13/2020 CLINICAL DATA:  Dizziness and possible brain mass EXAM: MRI HEAD WITHOUT AND WITH CONTRAST TECHNIQUE: Multiplanar, multiecho pulse sequences of the brain and surrounding structures were obtained without and with intravenous contrast. CONTRAST:  109mL GADAVIST GADOBUTROL 1 MMOL/ML IV SOLN COMPARISON:  Head CT 11/13/2020 Brain MRI 05/05/2013 FINDINGS: Brain: No acute infarct, mass effect or extra-axial collection. No acute or chronic hemorrhage. There is hyperintense T2-weighted signal in the posterior right parietal lobe, likely an old infarct. Mild periventricular white matter hyperintense T2-weighted signal. The midline structures are normal. Vascular: Major flow voids are preserved. Skull and upper cervical spine: Normal calvarium and skull base. Visualized upper cervical spine and soft tissues are normal. Sinuses/Orbits:No paranasal sinus fluid levels or advanced mucosal thickening. No mastoid or middle ear effusion. Normal orbits. IMPRESSION: 1. No acute intracranial abnormality.  No mass lesion. 2. 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 Electronically Signed   By: Ulyses Jarred M.D.   On: 11/13/2020 21:25   CT Renal Stone Study  Result Date: 11/13/2020 CLINICAL DATA:  Bilateral leg pain and swelling that began 2-3 hours, abdominal pain, nausea EXAM: CT ABDOMEN AND PELVIS WITHOUT CONTRAST TECHNIQUE: Multidetector CT imaging of the abdomen and pelvis was performed following the standard protocol without IV contrast. COMPARISON:  CT abdomen/pelvis 11/01/2020, MR  abdomen 11/03/2020 FINDINGS: Lower chest: There are bilateral pleural effusions, increased since 11/01/2020. Patchy opacities in the left lower lobe are increased since  the prior study are suspicious for infection or aspiration. The heart is enlarged. Coronary artery calcifications are again seen. Hepatobiliary: The liver is unremarkable, within the confines of noncontrast technique. The previously reported abnormality seen on the abdominal ultrasound from 11/01/2020 is not identified. The gallbladder is contracted but again demonstrates a thickened wall and mild pericholecystic fat stranding. Pancreas: Unremarkable. Spleen: Unremarkable. Adrenals/Urinary Tract: The adrenals are unremarkable. The native kidneys are atrophic, with no hydronephrosis or hydroureter. A transplant kidney is again seen in the right lower quadrant. There is prominence of the transplant ureter and pelvis with mild stranding in the surrounding fat. There are no focal lesions or stones seen in the transplant kidney or ureter. Overall, the findings are similar to the study from 11/01/2020. The bladder is decompressed but grossly unremarkable. Stomach/Bowel: The stomach is unremarkable. There is no evidence of bowel obstruction. There is no evidence of abnormal bowel wall thickening or inflammatory change. There is fluid in the distal colon/rectum. There are scattered colonic diverticula without evidence of acute diverticulitis. Vascular/Lymphatic: There is calcified atherosclerotic plaque throughout the nonaneurysmal abdominal aorta. There is no abdominal or pelvic lymphadenopathy. Reproductive: A calcification in the uterus likely reflects a fibroid. There is no adnexal mass. Other: There is trace free fluid in the pelvis, nonspecific and not significantly changed. There is no free air. There is diffuse body wall edema, similar to the prior study. Musculoskeletal: There is no acute osseous abnormality or aggressive osseous lesion. IMPRESSION:  1. Overall, no new acute finding in the abdomen or pelvis compared to the study from 11/01/2020. 2. Similar appearance of the transplant kidney in the right lower quadrant with unchanged prominence of the pelvis and ureter with no stone or other obstructing lesion seen. There is mild stranding in the surrounding fat, also similar to the prior study. Correlate with symptoms and laboratory values if there is concern for pyelonephritis. 3. The gallbladder again demonstrates mild wall thickening and pericholecystic fat stranding. This is again nonspecific and could be due to heart failure. This could be evaluated with HIDA scan if there is clinical concern for acute cholecystitis. 4. Bilateral pleural effusions, increased since 11/01/2020, with increased patchy opacities in the left base suspicious for infection or aspiration. 5. Fluid in the colon which can be seen in the setting of diarrhea. 6. Diffuse body wall edema and trace free fluid in the pelvis, similar to the prior study. 7. Unchanged cardiomegaly. 8.  Aortic Atherosclerosis (ICD10-I70.0). Electronically Signed   By: Valetta Mole M.D.   On: 11/13/2020 15:59       LOS: 0 days   [facility] Pager on [email or web address]  11/14/2020, 9:28 AM

## 2020-11-14 NOTE — Evaluation (Signed)
Occupational Therapy Evaluation Patient Details Name: Jenna [name] MRN: [id number] DOB: 04/20/1955 Today's Date: 11/14/2020   History of Present Illness Pt is a 65 y.o. female who was admitted earlier this month for acute kidney injury, pyelonephritis, Pseudomonas UTI, melanotic stools and acute systolic CHF.  Also was noted to have large right-sided pleural effusion.  She was stabilized and then discharged to a skilled nursing facility on 9/28.  Patient presented back to ED on 9/29 with complaints of bilateral leg pain and swelling, abdominal pain nausea chest pain and admitted for Acute on chronic systolic CHF and Chronic kidney disease stage IV/history of renal transplant. Past medical history significant of anemia, CAD, depression, chronic kidney disease stage IV, status post renal transplant on immunosuppressants, hypertension, focal segmental glomerularsclerosis, systolic CHF EF 20-25, lupus nephritis, ischemic cardiomyopathy, history of LA thrombus, obesity, paroxysmal atrial fibrillation, CVA of parietal lobe, known CAD status post DES to LAD in September 2020.   Clinical Impression   Mrs. Jenna [name] is a 65 year old woman who presents with complaints of generalized pain and acute pain in RUE, generalized weakness, decreased activity tolerance, impaired balance and lethargy from medications. On evaluation she required max 2 f or bed mobility and min assist x 2 to stand and take steps to head of bed.  She reported dizziness with standing and intolerance with activity due to pain. Patient requiring increased assistance for ADLs due to pain in right dominant upper extremity. Patient will benefit from skilled OT services while in hospital to improve deficits and learn compensatory strategies as needed in order to improve functional abilities.       Recommendations for follow up therapy are one component of a multi-disciplinary discharge planning process, led by the attending physician.   Recommendations may be updated based on patient status, additional functional criteria and insurance authorization.   Follow Up Recommendations  SNF    Equipment Recommendations  None recommended by OT    Recommendations for Other Services       Precautions / Restrictions Precautions Precautions: Fall Precaution Comments: RUE pain (don't touch) Restrictions Weight Bearing Restrictions: No      Mobility Bed Mobility Overal bed mobility: Needs Assistance Bed Mobility: Supine to Sit;Sit to Supine     Supine to sit: Max assist;+2 for physical assistance Sit to supine: Max assist;+2 for physical assistance   General bed mobility comments: pt with decreased initiation, reporting pain; requiring assist for upper body due to limited use of R UE due to pain and significant assist for lower body    Transfers Overall transfer level: Needs assistance Equipment used: Rolling walker (2 wheeled) Transfers: Sit to/from Stand Sit to Stand: Min assist;+2 safety/equipment         General transfer comment: verbal cues for technique, pt requiring slight assist to rise and then steady due to posterior lean however pt able to take a few steps up [facility] with increased time. reported dizziness in standing.    Balance Overall balance assessment: Needs assistance Sitting-balance support: No upper extremity supported;Feet supported Sitting balance-Leahy Scale: Fair     Standing balance support: Bilateral upper extremity supported;During functional activity Standing balance-Leahy Scale: Poor Standing balance comment: reliant on UE support of RW                           ADL either performed or assessed with clinical judgement   ADL Overall ADL's : Needs assistance/impaired Eating/Feeding: Bed level;Minimal assistance  Grooming: Moderate assistance   Upper Body Bathing: Maximal assistance;Bed level   Lower Body Bathing: Maximal assistance;Bed level   Upper Body Dressing :  Maximal assistance;Bed level   Lower Body Dressing: Maximal assistance;Bed level   Toilet Transfer: Minimal assistance;+2 for safety/equipment;BSC;RW;Stand-pivot   Toileting- Clothing Manipulation and Hygiene: Maximal assistance;Sit to/from stand;+2 for safety/equipment       Functional mobility during ADLs: +2 for safety/equipment       Vision Patient Visual Report: No change from baseline       Perception     Praxis      Pertinent Vitals/Pain Pain Assessment: Faces Faces Pain Scale: Hurts whole lot Pain Location: mostly c/o pain in R UE however reports generalized pain and tender to any touch Pain Descriptors / Indicators: Sore;Discomfort;Grimacing;Guarding Pain Intervention(s): Limited activity within patient's tolerance;Monitored during session;Patient requesting pain meds-RN notified     Hand Dominance Right   Extremity/Trunk Assessment Upper Extremity Assessment Upper Extremity Assessment: RUE deficits/detail;LUE deficits/detail RUE Deficits / Details: Complaints of a lot of pain so not able to really assess -she eventually was able to use it to scoot and placed it on walker. doesn't want to bend the elbow. RUE Sensation: history of peripheral neuropathy RUE Coordination:  (grossly functional [place].) LUE Deficits / Details: WFL ROM, grossly 3+/5 strength - though testing effected by patient's complaints of pain. LUE Sensation: history of peripheral neuropathy LUE Coordination: WNL   Lower Extremity Assessment Lower Extremity Assessment: Defer to PT evaluation   Cervical / Trunk Assessment Cervical / Trunk Assessment: Kyphotic   Communication Communication Communication: No difficulties   Cognition Arousal/Alertness: Awake/alert Behavior During Therapy: Flat affect Overall Cognitive Status: No family/caregiver present to determine baseline cognitive functioning Area of Impairment: Memory;Following commands;Problem solving                     Memory:  Decreased short-term memory Following Commands: Follows one step commands with increased time     Problem Solving: Slow processing;Decreased initiation;Requires verbal cues General Comments: requires increased time to process/initiate, perseverating on R UE pain and getting "shots" in UE. At times difficult to understand   General Comments       Exercises     Shoulder Instructions      Home Living Family/patient expects to be discharged to:: Private residence Living Arrangements: Alone Available Help at Discharge: Family;Friend(s);Personal care attendant;Available PRN/intermittently Type of Home: Apartment Home Access: Stairs to enter Entrance Stairs-Number of Steps: flight Entrance Stairs-Rails: Right;Left Home Layout: One level     Bathroom Shower/Tub: Teacher, early years/pre: Standard     Home Equipment: Environmental consultant - 2 wheels;Cane - single point;Bedside commode          Prior Functioning/Environment Level of Independence: Needs assistance  Gait / Transfers Assistance Needed: ambulates with cane ADL's / Homemaking Assistance Needed: PCS worker does IADL, pt is able to perform self care   Comments: all information from recent admission which pt discharged to SNF and then quickly returned to ED and readmitted        OT Problem List: Decreased strength;Decreased activity tolerance;Impaired balance (sitting and/or standing);Decreased knowledge of use of DME or AE;Decreased cognition;Decreased range of motion;Decreased safety awareness;Pain;Cardiopulmonary status limiting activity;Impaired UE functional use;Obesity;Increased edema      OT Treatment/Interventions: Self-care/ADL training;Therapeutic exercise;DME and/or AE instruction;Therapeutic activities;Patient/family education;Balance training;Manual therapy    OT Goals(Current goals can be found in the care plan section) Acute Rehab OT Goals Patient Stated Goal: get strong enough to  go home OT Goal  Formulation: With patient Time For Goal Achievement: 11/28/20 Potential to Achieve Goals: Good  OT Frequency: Min 2X/week   Barriers to D/C:            Co-evaluation PT/OT/SLP Co-Evaluation/Treatment: Yes Reason for Co-Treatment: For patient/therapist safety;To address functional/ADL transfers PT goals addressed during session: Mobility/safety with mobility OT goals addressed during session: ADL's and self-care      AM-PAC OT "6 Clicks" Daily Activity     Outcome Measure Help from another person eating meals?: A Little Help from another person taking care of personal grooming?: A Lot Help from another person toileting, which includes using toliet, bedpan, or urinal?: A Lot Help from another person bathing (including washing, rinsing, drying)?: A Lot Help from another person to put on and taking off regular upper body clothing?: A Lot Help from another person to put on and taking off regular lower body clothing?: A Lot 6 Click Score: 13   End of Session Equipment Utilized During Treatment: Rolling walker;Gait belt Nurse Communication: Patient requests pain meds  Activity Tolerance: Patient limited by pain Patient left: with call bell/phone within reach;in bed;with bed alarm set  OT Visit Diagnosis: Unsteadiness on feet (R26.81);Other abnormalities of gait and mobility (R26.89);Muscle weakness (generalized) (M62.81);Pain                Time: 4171-2787 OT Time Calculation (min): 28 min Charges:  OT General Charges $OT Visit: 1 Visit OT Evaluation $OT Eval Moderate Complexity: 1 Mod  Benna Arno, OTR/[facility] [place]  Office [phone] Pager: [place] 11/14/2020, 3:24 PM

## 2020-11-14 NOTE — Plan of Care (Signed)
Pt admitted to [id number] from ED. Pt is lethargic but arouses to voices, follows commands and is A&Ox3. Denies pain, endorses discomfort in legs. Pt had purposeful movements in all extremities and denies numbness in any extremities. Pt skin intact w/ +2 pitting edema noted in bilateral lower extremities. Pt educated on plan of care, fall risk prevention strategies, and call bell usage. Pt verbalized understanding. VSS. Will continue to monitor.   Problem: Education: Goal: Knowledge of General Education information will improve Description: Including pain rating scale, medication(s)/side effects and non-pharmacologic comfort measures Outcome: Progressing   Problem: Clinical Measurements: Goal: Ability to maintain clinical measurements within normal limits will improve Outcome: Progressing Goal: Respiratory complications will improve Outcome: Progressing Goal: Cardiovascular complication will be avoided Outcome: Progressing   Problem: Nutrition: Goal: Adequate nutrition will be maintained Outcome: Progressing   Problem: Coping: Goal: Level of anxiety will decrease Outcome: Progressing   Problem: Pain Managment: Goal: General experience of comfort will improve Outcome: Progressing   Problem: Safety: Goal: Ability to remain free from injury will improve Outcome: Progressing   Problem: Skin Integrity: Goal: Risk for impaired skin integrity will decrease Outcome: Progressing

## 2020-11-14 NOTE — Progress Notes (Signed)
Patient ID: Jenna [name], female   DOB: 1955-10-14, 65 y.o.   MRN: [id number]  [facility] Progress Note   Interim history: Jenna [name] was seen by nephrology from 9/18-9/25 during her recent hospitalization.  She is a 65 year old woman with history of hypertension, congestive heart failure with reduced ejection fraction, paroxysmal atrial fibrillation, history of CVA, history of coronary artery disease, systemic lupus erythematosus and end-stage renal disease status post deceased donor kidney transplant in April 2020 with progressive allograft failure and now chronic kidney disease stage IV T.  Her baseline creatinine appears to be in the 3's.  During her recent admission she was seen for acute kidney injury on chronic kidney disease that appears to have been from a combination of exacerbation of congestive heart failure, pyelonephritis of transplanted kidney and GI bleed.  She had adjustment of Prograf dose based on elevated trough level.  She was readmitted to the hospital yesterday with complaint of bilateral leg pain and increased swelling which appears to be from recurrent acute exacerbation of chronic congestive heart failure (EF 20 to 25%).  Assessment/ Plan:   1.  Chronic kidney disease stage IV T: Renal function appears to be relatively stable compared to her discharge labs from 3 days ago, I will continue her tacrolimus and prednisone and order for a recheck tacrolimus trough level.  (I have asked for the p.m. dose to be administered at 6 PM anticipating a 5-6 AM lab draw that should give [name] a true 12-hour trough).  Will assist with diuresis/optimizing volume status. 2.  Acute exacerbation of systolic heart failure: Transitioned from torsemide 20 mg twice a day to intravenous furosemide 60 mg a day to try and improve diuretic efficacy/volume unloading.  Continue strict input and output monitoring and decide on titration of diuretic therapy based on urine output response/weight  change. 3.  History of cytomegalovirus viremia: Continue valganciclovir with renal adjustment. 4.  Anemia of chronic disease: With recent GI bleed that appears to have resolved on twice daily PPI.  Plans noted for EGD as an outpatient. 5.  Mixed gap/non-anion gap metabolic acidosis: Secondary to advancing allograft dysfunction/chronic kidney disease and will continue treatment with sodium bicarbonate. 6.  Systemic lupus erythematosus: On prednisone (primarily for immunosuppression status post renal transplant), hydroxychloroquine and with additional benefit from tacrolimus. 7.  Pneumonia: Suspected aspiration versus community-acquired-on azithromycin and ceftriaxone at this time.  Subjective:   Complains of bilateral leg pain along with discomfort of her right upper extremity from swelling and recent infiltration of intravenous access.   Objective:   BP 132/80   Pulse (!) 55   Temp 97.6 F (36.4 C)   Resp 20   Ht $R'5\' 7"'tf$  (1.702 m)   Wt 94.5 kg   SpO2 98%   BMI 32.63 kg/m   Intake/Output Summary (Last 24 hours) at 11/14/2020 1226 Last data filed at 11/14/2020 0041 Gross per 24 hour  Intake 30 ml  Output --  Net 30 ml   Weight change:   Physical Exam: Gen: Appears fatigued/somewhat somnolent resting in bed, not in distress CVS: Pulse regular bradycardia, S1 and S2 normal Resp: Poor inspiratory effort with decreased breath sounds over bases, fine rales mid lung field bilaterally Abd: Soft, nontender, bowel sounds normal Ext: 2+ bilateral lower extremity edema, left brachiocephalic fistula with palpable thrill, localized right upper extremity swelling from infiltration of intravenous lines  Imaging: DG Chest 2 View  Result Date: 11/13/2020 CLINICAL DATA:  Chest pain and shortness of breath. Bilateral leg  pain and swelling. EXAM: CHEST - 2 VIEW COMPARISON:  11/10/2020 FINDINGS: Persistent airspace opacities in the right middle lobe both lower lobes. The airspace opacity in the left  lower lobe and in the right middle lobe is mildly worsened, with a reduced amount of aeration in the involved regions. There is at least mild enlargement of the cardiopericardial silhouette. Mild prominence of upper zone pulmonary vasculature may reflect pulmonary venous hypertension. Linear subsegmental atelectasis in the left mid lung appears stable. IMPRESSION: 1. Worsened bibasilar airspace opacities, suspicious for pneumonia or aspiration. 2. Cardiomegaly with pulmonary venous hypertension. Electronically Signed   By: Gaylyn Rong M.D.   On: 11/13/2020 14:54   CT HEAD WO CONTRAST ( )  Result Date: 11/13/2020 CLINICAL DATA:  Dizziness Left-sided weakness EXAM: CT HEAD WITHOUT CONTRAST TECHNIQUE: Contiguous axial images were obtained from the base of the skull through the vertex without intravenous contrast. COMPARISON:  None. FINDINGS: Brain: No acute intracranial hemorrhage. No midline shift or hydrocephalus. Interval development of low-density in the right occipital lobe white matter. Vascular: No hyperdense vessel or unexpected calcification. Skull: Normal. Negative for fracture or focal lesion. Sinuses/Orbits: No acute finding. Other: None. IMPRESSION: 1. No acute intracranial hemorrhage. 2. Interval development of white matter hypodensity in the right occipital lobe. While this may be due to interval infarct, findings may also be the result of vasogenic edema related to underlying mass. Further evaluation with contrast-enhanced MRI of the brain should be performed when patient condition allows. Electronically Signed   By: Acquanetta Belling M.D.   On: 11/13/2020 15:51   MR Brain W and Wo Contrast  Result Date: 11/13/2020 CLINICAL DATA:  Dizziness and possible brain mass EXAM: MRI HEAD WITHOUT AND WITH CONTRAST TECHNIQUE: Multiplanar, multiecho pulse sequences of the brain and surrounding structures were obtained without and with intravenous contrast. CONTRAST:  73mL GADAVIST GADOBUTROL 1 MMOL/ML  IV SOLN COMPARISON:  Head CT 11/13/2020 Brain MRI 05/05/2013 FINDINGS: Brain: No acute infarct, mass effect or extra-axial collection. No acute or chronic hemorrhage. There is hyperintense T2-weighted signal in the posterior right parietal lobe, likely an old infarct. Mild periventricular white matter hyperintense T2-weighted signal. The midline structures are normal. Vascular: Major flow voids are preserved. Skull and upper cervical spine: Normal calvarium and skull base. Visualized upper cervical spine and soft tissues are normal. Sinuses/Orbits:No paranasal sinus fluid levels or advanced mucosal thickening. No mastoid or middle ear effusion. Normal orbits. IMPRESSION: 1. No acute intracranial abnormality.  No mass lesion. 2. 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 Electronically Signed   By: Deatra Robinson M.D.   On: 11/13/2020 21:25   CT Renal Stone Study  Result Date: 11/13/2020 CLINICAL DATA:  Bilateral leg pain and swelling that began 2-3 hours, abdominal pain, nausea EXAM: CT ABDOMEN AND PELVIS WITHOUT CONTRAST TECHNIQUE: Multidetector CT imaging of the abdomen and pelvis was performed following the standard protocol without IV contrast. COMPARISON:  CT abdomen/pelvis 11/01/2020, MR abdomen 11/03/2020 FINDINGS: Lower chest: There are bilateral pleural effusions, increased since 11/01/2020. Patchy opacities in the left lower lobe are increased since the prior study are suspicious for infection or aspiration. The heart is enlarged. Coronary artery calcifications are again seen. Hepatobiliary: The liver is unremarkable, within the confines of noncontrast technique. The previously reported abnormality seen on the abdominal ultrasound from 11/01/2020 is not identified. The gallbladder is contracted but again demonstrates a thickened wall and mild pericholecystic fat stranding. Pancreas: Unremarkable. Spleen: Unremarkable. Adrenals/Urinary Tract: The adrenals are  unremarkable. The native kidneys are atrophic, with no hydronephrosis or hydroureter. A transplant kidney is again seen in the right lower quadrant. There is prominence of the transplant ureter and pelvis with mild stranding in the surrounding fat. There are no focal lesions or stones seen in the transplant kidney or ureter. Overall, the findings are similar to the study from 11/01/2020. The bladder is decompressed but grossly unremarkable. Stomach/Bowel: The stomach is unremarkable. There is no evidence of bowel obstruction. There is no evidence of abnormal bowel wall thickening or inflammatory change. There is fluid in the distal colon/rectum. There are scattered colonic diverticula without evidence of acute diverticulitis. Vascular/Lymphatic: There is calcified atherosclerotic plaque throughout the nonaneurysmal abdominal aorta. There is no abdominal or pelvic lymphadenopathy. Reproductive: A calcification in the uterus likely reflects a fibroid. There is no adnexal mass. Other: There is trace free fluid in the pelvis, nonspecific and not significantly changed. There is no free air. There is diffuse body wall edema, similar to the prior study. Musculoskeletal: There is no acute osseous abnormality or aggressive osseous lesion. IMPRESSION: 1. Overall, no new acute finding in the abdomen or pelvis compared to the study from 11/01/2020. 2. Similar appearance of the transplant kidney in the right lower quadrant with unchanged prominence of the pelvis and ureter with no stone or other obstructing lesion seen. There is mild stranding in the surrounding fat, also similar to the prior study. Correlate with symptoms and laboratory values if there is concern for pyelonephritis. 3. The gallbladder again demonstrates mild wall thickening and pericholecystic fat stranding. This is again nonspecific and could be due to heart failure. This could be evaluated with HIDA scan if there is clinical concern for acute cholecystitis. 4.  Bilateral pleural effusions, increased since 11/01/2020, with increased patchy opacities in the left base suspicious for infection or aspiration. 5. Fluid in the colon which can be seen in the setting of diarrhea. 6. Diffuse body wall edema and trace free fluid in the pelvis, similar to the prior study. 7. Unchanged cardiomegaly. 8.  Aortic Atherosclerosis (ICD10-I70.0). Electronically Signed   By: Valetta Mole M.D.   On: 11/13/2020 15:59    Labs: BMET Recent Labs  Lab 11/08/20 0559 11/09/20 0539 11/10/20 0223 11/11/20 0601 11/11/20 1044 11/13/20 1442 11/13/20 1612 11/13/20 1623 11/14/20 0548  NA 133* 132* 134* 133* 133* SPECIMEN HEMOLYZED. HEMOLYSIS MAY AFFECT INTEGRITY OF RESULTS. 140 136 137  K 4.3 4.2 4.4 6.1* 4.7 SPECIMEN HEMOLYZED. HEMOLYSIS MAY AFFECT INTEGRITY OF RESULTS. 4.2 4.8 4.3  CL 105 102 103 104 104 SPECIMEN HEMOLYZED. HEMOLYSIS MAY AFFECT INTEGRITY OF RESULTS. 110 110 104  CO2 17* 19* 18* 17* 15* SPECIMEN HEMOLYZED. HEMOLYSIS MAY AFFECT INTEGRITY OF RESULTS. 19*  --  18*  GLUCOSE 90 91 78 82 75 SPECIMEN HEMOLYZED. HEMOLYSIS MAY AFFECT INTEGRITY OF RESULTS. 97 92 79  BUN 77* 74* 75* 80* 79* SPECIMEN HEMOLYZED. HEMOLYSIS MAY AFFECT INTEGRITY OF RESULTS. 88* 97* 85*  CREATININE 3.79* 3.62* 3.40* 3.53* 3.47* SPECIMEN HEMOLYZED. HEMOLYSIS MAY AFFECT INTEGRITY OF RESULTS. 3.59* 3.80* 3.54*  CALCIUM 9.0 9.3 9.3 9.4 9.7 SPECIMEN HEMOLYZED. HEMOLYSIS MAY AFFECT INTEGRITY OF RESULTS. 10.4*  --  9.8  PHOS 4.8* 4.5 4.6 4.8*  --   --   --   --  5.6*   CBC Recent Labs  Lab 11/10/20 0223 11/11/20 0601 11/13/20 1442 11/13/20 1623 11/14/20 0548  WBC 2.3* 2.2* 1.8*  --  1.4*  NEUTROABS 1.6* 1.3* 1.0*  --  0.9*  HGB 11.0*  10.8* 11.7* 13.9 11.9*  HCT 33.1* 33.1* 36.2 41.0 37.1  MCV 89.9 90.4 91.4  --  93.0  PLT 71* 65* 86*  --  71*   Medications:     aspirin EC  81 mg Oral q morning   [START ON 11/15/2020] azithromycin  500 mg Oral Daily   cefdinir  300 mg Oral Q24H    citalopram  10 mg Oral Daily   furosemide  60 mg Intravenous BID   hydroxychloroquine  200 mg Oral BID   isosorbide-hydrALAZINE  1 tablet Oral TID   pantoprazole  40 mg Oral BID   predniSONE  5 mg Oral Daily   rosuvastatin  5 mg Oral q1800   sodium bicarbonate  650 mg Oral TID   sodium chloride flush  3 mL Intravenous Q12H   tacrolimus  0.5 mg Oral BID   [START ON 11/15/2020] valGANciclovir  450 mg Oral QODAY   Elmarie Shiley, MD 11/14/2020, 12:26 PM

## 2020-11-14 NOTE — Evaluation (Signed)
Physical Therapy Evaluation Patient Details Name: Jenna [name] MRN: [id number] DOB: [birth year] Today's Date: 11/14/2020  History of Present Illness  Pt is a 65 y.o. female who was admitted earlier this month for acute kidney injury, pyelonephritis, Pseudomonas UTI, melanotic stools and acute systolic CHF.  Also was noted to have large right-sided pleural effusion.  She was stabilized and then discharged to a skilled nursing facility on 9/28.  Patient presented back to ED on 9/29 with complaints of bilateral leg pain and swelling, abdominal pain nausea chest pain and admitted for Acute on chronic systolic CHF and Chronic kidney disease stage IV/history of renal transplant. Past medical history significant of anemia, CAD, depression, chronic kidney disease stage IV, status post renal transplant on immunosuppressants, hypertension, focal segmental glomerularsclerosis, systolic CHF EF 27-03, lupus nephritis, ischemic cardiomyopathy, history of LA thrombus, obesity, paroxysmal atrial fibrillation, CVA of parietal lobe, known CAD status post DES to LAD in September 2020.  Clinical Impression  Pt admitted with above diagnosis. Pt currently with functional limitations due to the deficits listed below (see PT Problem List). Pt will benefit from skilled PT to increase their independence and safety with mobility to allow discharge to the venue listed below.  Pt perseverating in R UE and receiving some type of "shots" earlier. Pt requiring increased assist for bed mobility however then able to stand and take a few steps up [facility] with only min assist.  Pt reluctant to remain OOB in recliner and returned to bed.  Pt with recent admission, discharged to SNF and then returned to ED within a day. Recommend pt return to SNF for rehab upon d/c.      Recommendations for follow up therapy are one component of a multi-disciplinary discharge planning process, led by the attending physician.  Recommendations may be updated  based on patient status, additional functional criteria and insurance authorization.  Follow Up Recommendations SNF    Equipment Recommendations  3in1 (PT);Other (comment) (rollator)    Recommendations for Other Services       Precautions / Restrictions Precautions Precautions: Fall      Mobility  Bed Mobility Overal bed mobility: Needs Assistance Bed Mobility: Supine to Sit;Sit to Supine     Supine to sit: Max assist;+2 for physical assistance Sit to supine: Max assist;+2 for physical assistance   General bed mobility comments: pt with decreased initiation, reporting pain; requiring assist for upper body due to limited use of R UE due to pain and significant assist for lower body    Transfers Overall transfer level: Needs assistance   Transfers: Sit to/from Stand Sit to Stand: Min assist;+2 safety/equipment         General transfer comment: verbal cues for technique, pt requiring slight assist to rise and then steady due to posterior lean however pt able to take a few steps up [facility] with increased time  Ambulation/Gait                Stairs            Wheelchair Mobility    Modified Rankin (Stroke Patients Only)       Balance Overall balance assessment: Needs assistance         Standing balance support: Bilateral upper extremity supported;During functional activity Standing balance-Leahy Scale: Poor Standing balance comment: reliant on UE support of RW  Pertinent Vitals/Pain Pain Assessment: Faces Faces Pain Scale: Hurts whole lot Pain Location: mostly c/o pain in R UE however reports generalized pain and tender to any touch Pain Descriptors / Indicators: Sore;Discomfort Pain Intervention(s): Repositioned;Monitored during session;Patient requesting pain meds-RN notified    Home Living Family/patient expects to be discharged to:: Private residence Living Arrangements: Alone Available Help at  Discharge: Family;Friend(s);Personal care attendant;Available PRN/intermittently Type of Home: Apartment Home Access: Stairs to enter Entrance Stairs-Rails: Right;Left Entrance Stairs-Number of Steps: flight Home Layout: One level Home Equipment: Environmental consultant - 2 wheels;Cane - single point;Bedside commode      Prior Function Level of Independence: Needs assistance   Gait / Transfers Assistance Needed: ambulates with cane  ADL's / Homemaking Assistance Needed: PCS worker does IADL, pt is able to perform self care  Comments: all information from recent admission which pt discharged to SNF and then quickly returned to ED and readmitted     Hand Dominance   Dominant Hand: Right    Extremity/Trunk Assessment        Lower Extremity Assessment Lower Extremity Assessment: Generalized weakness       Communication   Communication: No difficulties  Cognition Arousal/Alertness: Awake/alert Behavior During Therapy: Flat affect Overall Cognitive Status: No family/caregiver present to determine baseline cognitive functioning Area of Impairment: Memory;Following commands;Problem solving                     Memory: Decreased short-term memory Following Commands: Follows one step commands with increased time     Problem Solving: Slow processing;Decreased initiation;Requires verbal cues General Comments: requires increased time to process/initiate, perseverating on R UE pain and getting "shots" in UE      General Comments      Exercises     Assessment/Plan    PT Assessment Patient needs continued PT services  PT Problem List Decreased strength;Decreased range of motion;Decreased activity tolerance;Decreased balance;Decreased mobility;Decreased cognition;Decreased safety awareness       PT Treatment Interventions DME instruction;Gait training;Stair training;Functional mobility training;Therapeutic activities;Therapeutic exercise;Balance training;Patient/family education     PT Goals (Current goals can be found in the Care Plan section)  Acute Rehab PT Goals PT Goal Formulation: With patient Time For Goal Achievement: 11/28/20 Potential to Achieve Goals: Fair    Frequency Min 2X/week   Barriers to discharge        Co-evaluation PT/OT/SLP Co-Evaluation/Treatment: Yes Reason for Co-Treatment: To address functional/ADL transfers;For patient/therapist safety PT goals addressed during session: Mobility/safety with mobility OT goals addressed during session: ADL's and self-care       AM-PAC PT "6 Clicks" Mobility  Outcome Measure Help needed turning from your back to your side while in a flat bed without using bedrails?: A Lot Help needed moving from lying on your back to sitting on the side of a flat bed without using bedrails?: A Lot Help needed moving to and from a bed to a chair (including a wheelchair)?: A Little Help needed standing up from a chair using your arms (e.g., wheelchair or bedside chair)?: A Little Help needed to walk in hospital room?: A Little Help needed climbing 3-5 steps with a railing? : A Lot 6 Click Score: 15    End of Session   Activity Tolerance: Patient limited by fatigue Patient left: in bed;with call bell/phone within reach;with bed alarm set Nurse Communication: Mobility status PT Visit Diagnosis: Unsteadiness on feet (R26.81);Muscle weakness (generalized) (M62.81);Other abnormalities of gait and mobility (R26.89);Difficulty in walking, not elsewhere classified (R26.2)    Time:  2947-6546 PT Time Calculation (min) (ACUTE ONLY): 28 min   Charges:   PT Evaluation $PT Eval Moderate Complexity: 1 Mod     Kati PT, DPT Acute Rehabilitation Services Pager: [phone] Office: [place] 11/14/2020, 2:13 PM

## 2020-11-14 NOTE — Consult Note (Addendum)
Cardiology Consultation:   Patient ID: Jenna [name] MRN: [id number]; DOB: 16-Jun-1955  Admit date: 11/13/2020 Date of Consult: 11/14/2020  PCP:  [name], Jenna [name], Jenna [name]   Jenna [name] Providers Cardiologist:  follows with cardiology at [name]    Patient Profile:   Jenna [name] is a 65 y.o. 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  History of Present Illness:   Jenna [name] presented to the hospital 11/13/2020 with complaints of bilateral leg pain and swelling which started the same day.  She also had complaints of chest pain relieved with aspirin and nitroglycerin, abdominal pain, nausea, and right hand numbness.  She was discharged to SNF just 1 day prior following a hospitalization from 11/01/2020 to 7/41/2878 for complicated UTI which was managed with a 7-day course of IV antibiotics.  Her hospital course was complicated by mild anemia with heme positive stools for which GI was consulted and recommended PPI twice daily and possible outpatient EGD; hemoglobin remained stable.  For this reason her Eliquis was held at discharge.  She also had some acute on chronic combined CHF with diuresis managed by nephrology, as well as undergoing thoracentesis for management of pleural effusion.  She was recommended for outpatient follow-up with her cardiologist at [name].  Unfortunately she continued to feel poorly following discharge prompting quick return to the ED.  Vitals this admission with intermittent hypertension, bradycardia down to the 40s, otherwise VSS.  Labs notable for  electrolytes WNL, creatinine 3.5,WBC 1.4, hemoglobin 11.9, platelet 71, BNP >4500, HsTrop 145>105, procalcitonin 0.31. CXR showed worsening bibasilar airspace opacities suspicious for PNA or aspiration. CT head with suspicious change in white matter though follow-up MR brain showed no acute findings and old parietal lobe infarct. She was given IV lasix $Remove'40mg'PxwBCFW$  in the ED and started on IV lasix $Remove'60mg'eSaUkkz$  BID on admission. No I&Os documented. She was started on IV antibiotics for possible CAP vs aspiration PNA. Nephrology consulted due to complex renal history and ongoing CKD. Cardiology asked to evaluate CHF.   At the time of this evaluation her primary complaint at this time is right arm pain.  Notably she is a poor historian and it is difficult to understand her speech.  She reported her last episode of chest discomfort was in route to the ED yesterday.  Unclear whether her breathing is improving.  She has chronic lower extremity edema and pain which overall seems stable.   Past Medical History:  Diagnosis Date   Anemia    Anxiety    panic attack- talks herself and takes deep breathes   CAD (coronary artery disease)    a. STEMI 10/2018 s/p DES to LAD.   Depression    Dyspnea    with exertion    ESRD (end stage renal disease) ([place])    hemo TTHSAT   Essential hypertension    FSGS (focal segmental glomerulosclerosis) 2011   By renal biopsy   Head injury    age 44   HFrEF (heart failure with reduced ejection fraction) ([name])    History of kidney stones    kidney stone   Hx of lupus  nephritis 2011   by renal biopsy   Ischemic cardiomyopathy    Kidney transplant recipient    LA thrombus 10/2018   Lupus (systemic lupus erythematosus) (Jenna [name])    followed by Dr. Amil [name]   Obesity    Orthostatic hypotension    PAF (paroxysmal atrial fibrillation) (Jenna [name])    Parietal lobe infarction [facility])    a. remote parietal infarct on brain MRI 12/2019.   Pericarditis    age 25ish   Renal stone    Secondary  hyperparathyroidism of renal origin ([facility]enna [name])    SLE (systemic lupus erythematosus) (Jenna [name])    TIA (transient ischemic attack)    no residual effects    Past Surgical History:  Procedure Laterality Date   AV FISTULA PLACEMENT Left 07/04/2017   Procedure: ARTERIOVENOUS (AV) FISTULA CREATION BRACHIOCEPHALIC;  Surgeon: Jenna Posner, Jenna [name];  Location: [place] [facility];  Service: Vascular;  Laterality: Left;   COLONOSCOPY W/ POLYPECTOMY     IR FLUORO GUIDE CV LINE RIGHT  06/28/2017   IR US GUIDE VASC ACCESS RIGHT  06/28/2017   KIDNEY SURGERY     kidney transplant 2020   REVISION OF ARTERIOVENOUS GORETEX GRAFT Left 11/30/2017   Procedure: REVISION OF ARTERIOVENOUS FISTULA LEFT ARM Superfistulization and branch ligation.;  Surgeon: Jenna Sandy, Jenna [name];  Location: [place];  Service: Vascular;  Laterality: Left;     Home Medications:  Prior to Admission medications   Medication Sig Start Date End Date Taking? Authorizing Provider  acetaminophen (TYLENOL) 500 MG tablet Take 1,000 mg by mouth every 6 (six) hours as needed for headache.   Yes Provider, Historical, Jenna [name]  ALPRAZolam (XANAX) 0.25 MG tablet Take 1 tablet (0.25 mg total) by mouth at bedtime as needed for anxiety. 11/11/20  Yes [name], Jenna Latif, DO  aspirin 81 MG EC tablet Take 81 mg by mouth every morning. 11/21/19  Yes Provider, Historical, Jenna [name]  citalopram (CELEXA) 10 MG tablet Take 1 tablet (10 mg total) by mouth daily. 10/29/20  Yes [name], Jenna [name], Jenna [name]  gabapentin (NEURONTIN) 100 MG capsule Take 1 capsule (100 mg total) by mouth at bedtime. 11/11/20  Yes [name], Jenna Latif, DO  hydroxychloroquine (PLAQUENIL) 200 MG tablet Take 1 tablet (200 mg total) by mouth 2 (two) times daily. 12/25/13  Yes Jegede, Olugbemiga [name], Jenna [name]  isosorbide-hydrALAZINE (BIDIL) 20-37.5 MG tablet Take 1 tablet by mouth 3 (three) times daily. 08/26/20  Yes Jenna Odea, Jenna [name]  metoprolol succinate (TOPROL-XL) 25 MG 24 hr tablet Take 1 tablet (25 mg total) by mouth daily. 10/29/20  Yes  [name], Jenna [name], Jenna [name]  nitroGLYCERIN (NITROSTAT) 0.4 MG SL tablet Place 0.4 mg under the tongue every 5 (five) minutes as needed for chest pain.    Yes Provider, Historical, Jenna [name]  ondansetron (ZOFRAN) 4 MG tablet Take 1 tablet (4 mg total) by mouth every 6 (six) hours as needed for nausea. Patient taking differently: Take 4 mg by mouth every 6 (six) hours as needed for nausea or vomiting. 11/11/20  Yes [name], Jenna Latif, DO  pantoprazole (PROTONIX) 40 MG tablet Take 1 tablet (40 mg total) by mouth 2 (two) times daily before a meal. Patient taking differently: Take 40 mg by mouth 2 (two) times daily. 11/11/20  Yes [name], Jenna Latif, DO  predniSONE (DELTASONE) 5 MG tablet Take 5 mg by mouth daily. 05/16/19  Yes Provider, Historical, Jenna [name]  rosuvastatin (CRESTOR) 5 MG tablet Take 5 mg by mouth daily at 6 PM. 05/02/19  Yes Provider, Historical, Jenna [name]  simethicone (MYLICON) 80  MG chewable tablet Chew 80 mg by mouth every 6 (six) hours as needed for flatulence.   Yes Provider, Historical, Jenna [name]  sodium bicarbonate 650 MG tablet Take 1 tablet (650 mg total) by mouth 3 (three) times daily. 11/11/20  Yes [name], Jenna Latif, DO  tacrolimus (PROGRAF) 0.5 MG capsule Take 1 capsule (0.5 mg total) by mouth daily. 11/12/20  Yes [name], Jenna Latif, DO  torsemide (DEMADEX) 20 MG tablet Take 20 mg by mouth 2 (two) times daily.   Yes Provider, Historical, Jenna [name]  valGANciclovir (VALCYTE) 450 MG tablet Take 450 mg by mouth every other day.   Yes Provider, Historical, Jenna [name]  tacrolimus (PROGRAF) 0.5 MG capsule Take 1 capsule (0.5 mg total) by mouth at bedtime. Patient not taking: No sig reported 11/11/20   Kerney Elbe, DO    Inpatient Medications: Scheduled Meds:  aspirin EC  81 mg Oral q morning   [START ON 11/15/2020] azithromycin  500 mg Oral Daily   cefdinir  300 mg Oral Q24H   citalopram  10 mg Oral Daily   furosemide  60 mg Intravenous BID   hydroxychloroquine  200 mg Oral BID   isosorbide-hydrALAZINE  1 tablet Oral TID    pantoprazole  40 mg Oral BID   predniSONE  5 mg Oral Daily   rosuvastatin  5 mg Oral q1800   sodium bicarbonate  650 mg Oral TID   sodium chloride flush  3 mL Intravenous Q12H   tacrolimus  0.5 mg Oral BID   [START ON 11/15/2020] valGANciclovir  450 mg Oral QODAY   Continuous Infusions:  sodium chloride     PRN Meds: sodium chloride, acetaminophen **OR** acetaminophen, albuterol, ALPRAZolam, sodium chloride flush  Allergies:    Allergies  Allergen Reactions   Enalapril Maleate Anaphylaxis, Swelling and Other (See Comments)    Throat swells   Penicillins Other (See Comments)    Made patient lightheaded PATIENT HAS HAD A PCN REACTION WITH IMMEDIATE RASH, FACIAL/TONGUE/THROAT SWELLING, SOB, OR LIGHTHEADEDNESS WITH HYPOTENSION:  #  #  YES  #  #  Has patient had a PCN reaction causing severe rash involving mucus membranes or skin necrosis: No Has patient had a PCN reaction that required hospitalization: No Has patient had a PCN reaction occurring within the last 10 years: No If all of the above answers are "NO", then may proceed with Cephalosporin use.    Chocolate Nausea And Vomiting   Tape Itching, Rash and Other (See Comments)    Social History:   Social History   Socioeconomic History   Marital status: Legally Separated    Spouse name: Not on file   Number of children: 1   Years of education: 12   Highest education level: 12th grade  Occupational History   Not on file  Tobacco Use   Smoking status: Never    Passive exposure: Past   Smokeless tobacco: Never  Vaping Use   Vaping Use: Never used  Substance and Sexual Activity   Alcohol use: No   Drug use: No   Sexual activity: Not Currently  Other Topics Concern   Not on file  Social History Narrative   Right Handed   Lives in a two story home   Daughter recently got married    Social Determinants of Health   Financial Resource Strain: Low Risk    Difficulty of Paying Living Expenses: Not very hard  Food  Insecurity: No Food Insecurity   Worried About Charity fundraiser in the  Last Year: Never true   Ran Out of Food in the Last Year: Never true  Transportation Needs: No Transportation Needs   Lack of Transportation (Medical): No   Lack of Transportation (Non-Medical): No  Physical Activity: Sufficiently Active   Days of Exercise per Week: 5 days   Minutes of Exercise per Session: 30 min  Stress: Stress Concern Present   Feeling of Stress : To some extent  Social Connections: Moderately Isolated   Frequency of Communication with Friends and Family: More than three times a week   Frequency of Social Gatherings with Friends and Family: More than three times a week   Attends Religious Services: 1 to 4 times per year   Active Member of [employer] or Organizations: No   Attends Archivist Meetings: Never   Marital Status: Separated  Intimate Partner Violence: Not At Risk   Fear of Current or Ex-Partner: No   Emotionally Abused: No   Physically Abused: No   Sexually Abused: No    Family History:    Family History  Problem Relation Age of Onset   Diabetes Mother    Hypertension Father    Cancer Sister    Hypertension Brother      ROS:  Please see the history of present illness.   All other ROS reviewed and negative.     Physical Exam/Data:   Vitals:   11/14/20 0313 11/14/20 0352 11/14/20 0400 11/14/20 0925  BP:  (!) 144/82  132/80  Pulse:    (!) 55  Resp:  $Remo'13 13 20  'IfpsZ$ Temp:  97.7 F (36.5 C)  97.6 F (36.4 C)  TempSrc:  Oral    SpO2:  98%  98%  Weight: 94.5 kg     Height:        Intake/Output Summary (Last 24 hours) at 11/14/2020 1330 Last data filed at 11/14/2020 0041 Gross per 24 hour  Intake 30 ml  Output --  Net 30 ml   Last 3 Weights 11/14/2020 11/13/2020 11/11/2020  Weight (lbs) 208 lb 5.4 oz 204 lb 11.2 oz 204 lb 11.2 oz  Weight (kg) 94.5 kg 92.851 kg 92.851 kg     Body mass index is 32.63 kg/m.  General: Chronically ill-appearing female who appears  older than stated age 40: Sclera anicteric Neck: no JVD Vascular: No carotid bruits; Distal pulses 2+ bilaterally Cardiac:  normal S1, S2; bradycardic, regular rhythm; + murmur  Lungs:  clear to auscultation bilaterally, no wheezing, rhonchi or rales  Abd: soft, nontender, no hepatomegaly  Ext: 2+ LE edema Musculoskeletal:  No deformities, BUE and BLE strength normal and equal Skin: warm and dry  Neuro:  CNs 2-12 intact, no focal abnormalities noted Psych:  Normal affect   EKG:  The EKG was personally reviewed and demonstrates:  sinus rhythm, rate 72 bpm, non-specific IVCD, no STE/D, no significant change from previous  Telemetry:  Telemetry was personally reviewed and demonstrates:  sinus rhythm with bradycardia to the 40s at times  Relevant CV Studies: Echocardiogram 08/2020: SUMMARY  The left ventricular size is normal.  There is normal left ventricular wall thickness.  Left ventricular systolic function is severely reduced.  LV ejection fraction = 20-25%.  Apical akinesis with aneurysmal changes. Anteroseptum and apical  septum akinetic. Moderate hypokinesis of other segments.  Left ventricular filling pattern is indeterminate.  The right ventricle is mildly dilated.  The right ventricular systolic function is mildly reduced.  The left atrium is moderately dilated.  The right atrium is  moderately dilated.  Moderate mitral regurgitation with centrally directed jet likely in  setting of mitral annular dilation (diameter 4.0cm).  There is moderate to severe tricuspid regurgitation.  Estimated right ventricular systolic pressure is 52 mmHg.  Moderate pulmonary hypertension.  The inferior vena cava is moderately dilated with a decrease in  inspiratory collapse.  There is a pleural effusion present.  There is no pericardial effusion.  Probably no significant change in comparison with the prior study  noted   Laboratory Data:  High Sensitivity Troponin:   Recent Labs  Lab  11/06/20 1505 11/13/20 1442 11/13/20 1612 11/13/20 1845 11/14/20 0548  TROPONINIHS 120* 145* 145* 131* 105*     Chemistry Recent Labs  Lab 11/10/20 0223 11/11/20 0601 11/11/20 1044 11/13/20 1442 11/13/20 1612 11/13/20 1623 11/14/20 0548  NA 134* 133*   < > SPECIMEN HEMOLYZED. HEMOLYSIS MAY AFFECT INTEGRITY OF RESULTS. 140 136 137  K 4.4 6.1*   < > SPECIMEN HEMOLYZED. HEMOLYSIS MAY AFFECT INTEGRITY OF RESULTS. 4.2 4.8 4.3  CL 103 104   < > SPECIMEN HEMOLYZED. HEMOLYSIS MAY AFFECT INTEGRITY OF RESULTS. 110 110 104  CO2 18* 17*   < > SPECIMEN HEMOLYZED. HEMOLYSIS MAY AFFECT INTEGRITY OF RESULTS. 19*  --  18*  GLUCOSE 78 82   < > SPECIMEN HEMOLYZED. HEMOLYSIS MAY AFFECT INTEGRITY OF RESULTS. 97 92 79  BUN 75* 80*   < > SPECIMEN HEMOLYZED. HEMOLYSIS MAY AFFECT INTEGRITY OF RESULTS. 88* 97* 85*  CREATININE 3.40* 3.53*   < > SPECIMEN HEMOLYZED. HEMOLYSIS MAY AFFECT INTEGRITY OF RESULTS. 3.59* 3.80* 3.54*  CALCIUM 9.3 9.4   < > SPECIMEN HEMOLYZED. HEMOLYSIS MAY AFFECT INTEGRITY OF RESULTS. 10.4*  --  9.8  MG 2.4 2.5*  --   --   --   --  2.6*  GFRNONAA 14* 14*   < > SPECIMEN HEMOLYZED. HEMOLYSIS MAY AFFECT INTEGRITY OF RESULTS. 13*  --  14*  GFRAA  --   --   --  SPECIMEN HEMOLYZED. HEMOLYSIS MAY AFFECT INTEGRITY OF RESULTS.  --   --   --   ANIONGAP 13 12   < > SPECIMEN HEMOLYZED. HEMOLYSIS MAY AFFECT INTEGRITY OF RESULTS. 11  --  15   < > = values in this interval not displayed.    Recent Labs  Lab 11/13/20 1442 11/13/20 1612 11/14/20 0548  PROT SPECIMEN HEMOLYZED. HEMOLYSIS MAY AFFECT INTEGRITY OF RESULTS. 7.6 6.9  ALBUMIN SPECIMEN HEMOLYZED. HEMOLYSIS MAY AFFECT INTEGRITY OF RESULTS. 3.5 3.2*  AST SPECIMEN HEMOLYZED. HEMOLYSIS MAY AFFECT INTEGRITY OF RESULTS. 32 27  ALT SPECIMEN HEMOLYZED. HEMOLYSIS MAY AFFECT INTEGRITY OF RESULTS. 18 16  ALKPHOS SPECIMEN HEMOLYZED. HEMOLYSIS MAY AFFECT INTEGRITY OF RESULTS. 139* 125  BILITOT SPECIMEN HEMOLYZED. HEMOLYSIS MAY AFFECT INTEGRITY OF  RESULTS. 1.5* 1.4*   Lipids No results for input(s): CHOL, TRIG, HDL, LABVLDL, LDLCALC, CHOLHDL in the last 168 hours.  Hematology Recent Labs  Lab 11/11/20 0601 11/13/20 1442 11/13/20 1623 11/14/20 0548  WBC 2.2* 1.8*  --  1.4*  RBC 3.66* 3.96  --  3.99  HGB 10.8* 11.7* 13.9 11.9*  HCT 33.1* 36.2 41.0 37.1  MCV 90.4 91.4  --  93.0  MCH 29.5 29.5  --  29.8  MCHC 32.6 32.3  --  32.1  RDW 21.9* 21.8*  --  22.4*  PLT 65* 86*  --  71*   Thyroid No results for input(s): TSH, FREET4 in the last 168 hours.  BNP Recent Labs  Lab 11/10/20 0223 11/13/20  1442  BNP >4,500.0* >4,500.0*    DDimer No results for input(s): DDIMER in the last 168 hours.   Radiology/Studies:  DG Chest 2 View  Result Date: 11/13/2020 CLINICAL DATA:  Chest pain and shortness of breath. Bilateral leg pain and swelling. EXAM: CHEST - 2 VIEW COMPARISON:  11/10/2020 FINDINGS: Persistent airspace opacities in the right middle lobe both lower lobes. The airspace opacity in the left lower lobe and in the right middle lobe is mildly worsened, with a reduced amount of aeration in the involved regions. There is at least mild enlargement of the cardiopericardial silhouette. Mild prominence of upper zone pulmonary vasculature may reflect pulmonary venous hypertension. Linear subsegmental atelectasis in the left mid lung appears stable. IMPRESSION: 1. Worsened bibasilar airspace opacities, suspicious for pneumonia or aspiration. 2. Cardiomegaly with pulmonary venous hypertension. Electronically Signed   By: Van Clines M.D.   On: 11/13/2020 14:54   CT HEAD WO CONTRAST (5MM)  Result Date: 11/13/2020 CLINICAL DATA:  Dizziness Left-sided weakness EXAM: CT HEAD WITHOUT CONTRAST TECHNIQUE: Contiguous axial images were obtained from the base of the skull through the vertex without intravenous contrast. COMPARISON:  None. FINDINGS: Brain: No acute intracranial hemorrhage. No midline shift or hydrocephalus. Interval development  of low-density in the right occipital lobe white matter. Vascular: No hyperdense vessel or unexpected calcification. Skull: Normal. Negative for fracture or focal lesion. Sinuses/Orbits: No acute finding. Other: None. IMPRESSION: 1. No acute intracranial hemorrhage. 2. Interval development of white matter hypodensity in the right occipital lobe. While this may be due to interval infarct, findings may also be the result of vasogenic edema related to underlying mass. Further evaluation with contrast-enhanced MRI of the brain should be performed when patient condition allows. Electronically Signed   By: Miachel Roux M.D.   On: 11/13/2020 15:51   MR Brain W and Wo Contrast  Result Date: 11/13/2020 CLINICAL DATA:  Dizziness and possible brain mass EXAM: MRI HEAD WITHOUT AND WITH CONTRAST TECHNIQUE: Multiplanar, multiecho pulse sequences of the brain and surrounding structures were obtained without and with intravenous contrast. CONTRAST:  74mL GADAVIST GADOBUTROL 1 MMOL/ML IV SOLN COMPARISON:  Head CT 11/13/2020 Brain MRI 05/05/2013 FINDINGS: Brain: No acute infarct, mass effect or extra-axial collection. No acute or chronic hemorrhage. There is hyperintense T2-weighted signal in the posterior right parietal lobe, likely an old infarct. Mild periventricular white matter hyperintense T2-weighted signal. The midline structures are normal. Vascular: Major flow voids are preserved. Skull and upper cervical spine: Normal calvarium and skull base. Visualized upper cervical spine and soft tissues are normal. Sinuses/Orbits:No paranasal sinus fluid levels or advanced mucosal thickening. No mastoid or middle ear effusion. Normal orbits. IMPRESSION: 1. No acute intracranial abnormality.  No mass lesion. 2. 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 Electronically Signed   By: Ulyses Jarred M.D.   On: 11/13/2020 21:25   CT Renal Stone Study  Result Date: 11/13/2020 CLINICAL DATA:   Bilateral leg pain and swelling that began 2-3 hours, abdominal pain, nausea EXAM: CT ABDOMEN AND PELVIS WITHOUT CONTRAST TECHNIQUE: Multidetector CT imaging of the abdomen and pelvis was performed following the standard protocol without IV contrast. COMPARISON:  CT abdomen/pelvis 11/01/2020, MR abdomen 11/03/2020 FINDINGS: Lower chest: There are bilateral pleural effusions, increased since 11/01/2020. Patchy opacities in the left lower lobe are increased since the prior study are suspicious for infection or aspiration. The heart is enlarged. Coronary artery calcifications are again seen. Hepatobiliary: The liver is unremarkable, within the confines  of noncontrast technique. The previously reported abnormality seen on the abdominal ultrasound from 11/01/2020 is not identified. The gallbladder is contracted but again demonstrates a thickened wall and mild pericholecystic fat stranding. Pancreas: Unremarkable. Spleen: Unremarkable. Adrenals/Urinary Tract: The adrenals are unremarkable. The native kidneys are atrophic, with no hydronephrosis or hydroureter. A transplant kidney is again seen in the right lower quadrant. There is prominence of the transplant ureter and pelvis with mild stranding in the surrounding fat. There are no focal lesions or stones seen in the transplant kidney or ureter. Overall, the findings are similar to the study from 11/01/2020. The bladder is decompressed but grossly unremarkable. Stomach/Bowel: The stomach is unremarkable. There is no evidence of bowel obstruction. There is no evidence of abnormal bowel wall thickening or inflammatory change. There is fluid in the distal colon/rectum. There are scattered colonic diverticula without evidence of acute diverticulitis. Vascular/Lymphatic: There is calcified atherosclerotic plaque throughout the nonaneurysmal abdominal aorta. There is no abdominal or pelvic lymphadenopathy. Reproductive: A calcification in the uterus likely reflects a fibroid.  There is no adnexal mass. Other: There is trace free fluid in the pelvis, nonspecific and not significantly changed. There is no free air. There is diffuse body wall edema, similar to the prior study. Musculoskeletal: There is no acute osseous abnormality or aggressive osseous lesion. IMPRESSION: 1. Overall, no new acute finding in the abdomen or pelvis compared to the study from 11/01/2020. 2. Similar appearance of the transplant kidney in the right lower quadrant with unchanged prominence of the pelvis and ureter with no stone or other obstructing lesion seen. There is mild stranding in the surrounding fat, also similar to the prior study. Correlate with symptoms and laboratory values if there is concern for pyelonephritis. 3. The gallbladder again demonstrates mild wall thickening and pericholecystic fat stranding. This is again nonspecific and could be due to heart failure. This could be evaluated with HIDA scan if there is clinical concern for acute cholecystitis. 4. Bilateral pleural effusions, increased since 11/01/2020, with increased patchy opacities in the left base suspicious for infection or aspiration. 5. Fluid in the colon which can be seen in the setting of diarrhea. 6. Diffuse body wall edema and trace free fluid in the pelvis, similar to the prior study. 7. Unchanged cardiomegaly. 8.  Aortic Atherosclerosis (ICD10-I70.0). Electronically Signed   By: Valetta Mole M.D.   On: 11/13/2020 15:59     Assessment and Plan:   1. Acute on chronic combined CHF: patient presented with complaints of LE swelling. She has known CHF with EF 20-25%. GDMT has been limited by bradycardia and CKD. BNP >4500 (appears to be elevated to this degree on several hospitalization this year). CXR this admission shows suspicions for PNA. She was started on IV lasix $Remove'60mg'gLKsqhW$  BID for possible CHF component. I&Os are inaccurate at this time. She does have 2+ LE edema on exam with RUE edema as well but JVD is not markedly elevated.   - Will defer diuresis to nephrology given history of ESRD s/p transplant with ongoing CKD stage IV.  - Continue to monitor strict I&Os and daily weights - Continue to monitor elecrtrolytes and repleate as needed to maintain K >4, Mg >2 - Continue bidil   2. Chest pain in patient with CAD s/p PCI/DES to LAD in 2020: patient not able to characterize CP well, though she has been CP free since EMS transport to [place]. HsTrop with low flat trend in the 100s; not c/w ACS especially in patient with underlying  CKD. EKG shows no ischemic changes  - No further ischemic testing recommended at this time.  - Can continue aspirin and statin  3. Paroxysmal atrial fibrillation: maintaining sinus rhythm this admission. Not currently on eliquis due to recent GI bleed history. She has baseline bradycardia so AV nodal blocking agents have been avoided - Would resume eliquis once GI bleed has resolved/been worked up and bleeding risk felt to be acceptable  4. 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 Nephrology has been consulted - Defer further management to primary team/nephrology  5. PNA: CXR c/f CAP vs aspiration PNA. Procalcitonin is elevated. She was started on IV antibiotics - Continue antibiotics per primary team  Remainder of care per primary team: - CMV - Pancytopenia - SLE   Risk Assessment/Risk Scores:   HEAR Score (for undifferentiated chest pain):  HEAR Score: 5  New York Heart Association (NYHA) Functional Class NYHA Class III  CHA2DS2-VASc Score = 6   This indicates a 9.7% annual risk of stroke. The patient's score is based upon: CHF History: 1 HTN History: 1 Diabetes History: 0 Stroke History: 2 Vascular Disease History: 1 Age Score: 0 Gender Score: 1       For questions or updates, please contact [facility] Please consult [email or web address] for contact info under    Signed, Abigail Butts, PA-C  11/14/2020 1:30 PM  Patient seen and examined with Roby Lofts PA-C.  Agree as above, with the following exceptions and changes as noted below. Pt is a 65 yo female with ischemic CM, PAF, lupus nephritis with renal transplant 03/2018. Cardiology consulted for chest pain. Gen: NAD, CV: RRR, systolic murmur, Lungs: clear, Abd: soft, Extrem: 2+ edema, Neuro/Psych: alert and oriented x 3. All available labs, radiology testing, previous records reviewed. She presents with quick return to hospital after SNF discharge due to leg pain, nausea and chest pain. Troponins are unimpressive in setting of renal failure, suspect chest pain due to volume overload. Diuresis to be guided by nephrology given prior renal transplant. Seems to be chest pain free at this time. Also in SR on telemetry, needs to discuss with outpatient cardiologist timing of reinitiating eliquis in setting of recent GI bleed.   No other recommendations from cardiology at this time, primarily needs volume management per nephrology.  Cardiology will see as needed over the weekend/while patient is in hospital.   Elouise Munroe, Jenna [name] 11/14/20 3:54 PM

## 2020-11-14 NOTE — Progress Notes (Signed)
Date and time results received: 11/14/20 0738  Test: WBC Critical Value: 1.4  Name of Provider Notified: Quinn Plowman, NP  Orders Received? Or Actions Taken?:  Place pt on neutropenic precautions.

## 2020-11-14 NOTE — Progress Notes (Signed)
Pt RAC IV infiltrated w/ Azithromycin running. Arm is swollen w/ erythema around IV site (marked to monitor for growth). IV was removed and pharmacy contacted. Pharmacist recommended heat packs w/ elevation of arm. Pt complaining of severe 10/10 pain @ site describing it as "pins and needles".  Floor MD notified, orders placed for K pad and morphine PRN. Awaiting IV team for placement of new line. Pt still having c/o arm pain, arm is elevated w/ heat packs applied. Will continue to monitor.

## 2020-11-14 NOTE — Progress Notes (Signed)
Admitting MD notified that lab was unable to draw pt labs d/t difficult vasculature. Lab will reattempt around 0500. Orders to continue monitoring pt neuro status and to order STAT ABG and notify floor MD if pt becomes more obtunded.Will continue to monitor.

## 2020-11-15 DIAGNOSIS — D638 Anemia in other chronic diseases classified elsewhere: Secondary | ICD-10-CM | POA: Diagnosis not present

## 2020-11-15 DIAGNOSIS — I480 Paroxysmal atrial fibrillation: Secondary | ICD-10-CM | POA: Diagnosis not present

## 2020-11-15 DIAGNOSIS — N185 Chronic kidney disease, stage 5: Secondary | ICD-10-CM | POA: Diagnosis not present

## 2020-11-15 DIAGNOSIS — I5023 Acute on chronic systolic (congestive) heart failure: Secondary | ICD-10-CM | POA: Diagnosis not present

## 2020-11-15 LAB — CBC WITH DIFFERENTIAL/PLATELET
Abs Immature Granulocytes: 0.19 10*3/uL — ABNORMAL HIGH (ref 0.00–0.07)
Basophils Absolute: 0 10*3/uL (ref 0.0–0.1)
Basophils Relative: 1 %
Eosinophils Absolute: 0 10*3/uL (ref 0.0–0.5)
Eosinophils Relative: 1 %
HCT: 37.8 % (ref 36.0–46.0)
Hemoglobin: 12 g/dL (ref 12.0–15.0)
Immature Granulocytes: 13 %
Lymphocytes Relative: 14 %
Lymphs Abs: 0.2 10*3/uL — ABNORMAL LOW (ref 0.7–4.0)
MCH: 29.1 pg (ref 26.0–34.0)
MCHC: 31.7 g/dL (ref 30.0–36.0)
MCV: 91.5 fL (ref 80.0–100.0)
Monocytes Absolute: 0.3 10*3/uL (ref 0.1–1.0)
Monocytes Relative: 19 %
Neutro Abs: 0.8 10*3/uL — ABNORMAL LOW (ref 1.7–7.7)
Neutrophils Relative %: 52 %
Platelets: 66 10*3/uL — ABNORMAL LOW (ref 150–400)
RBC: 4.13 MIL/uL (ref 3.87–5.11)
RDW: 22 % — ABNORMAL HIGH (ref 11.5–15.5)
WBC: 1.5 10*3/uL — ABNORMAL LOW (ref 4.0–10.5)
nRBC: 1.3 % — ABNORMAL HIGH (ref 0.0–0.2)

## 2020-11-15 LAB — RENAL FUNCTION PANEL
Albumin: 3.3 g/dL — ABNORMAL LOW (ref 3.5–5.0)
Anion gap: 12 (ref 5–15)
BUN: 90 mg/dL — ABNORMAL HIGH (ref 8–23)
CO2: 19 mmol/L — ABNORMAL LOW (ref 22–32)
Calcium: 9.8 mg/dL (ref 8.9–10.3)
Chloride: 103 mmol/L (ref 98–111)
Creatinine, Ser: 3.59 mg/dL — ABNORMAL HIGH (ref 0.44–1.00)
GFR, Estimated: 13 mL/min — ABNORMAL LOW (ref >60)
Glucose, Bld: 81 mg/dL (ref 70–99)
Phosphorus: 5.9 mg/dL — ABNORMAL HIGH (ref 2.5–4.6)
Potassium: 4.5 mmol/L (ref 3.5–5.1)
Sodium: 134 mmol/L — ABNORMAL LOW (ref 135–145)

## 2020-11-15 LAB — MAGNESIUM: Magnesium: 2.5 mg/dL — ABNORMAL HIGH (ref 1.7–2.4)

## 2020-11-15 LAB — MRSA NEXT GEN BY PCR, NASAL: MRSA by PCR Next Gen: NOT DETECTED

## 2020-11-15 LAB — STREP PNEUMONIAE URINARY ANTIGEN: Strep Pneumo Urinary Antigen: NEGATIVE

## 2020-11-15 LAB — PROCALCITONIN: Procalcitonin: 0.45 ng/mL

## 2020-11-15 NOTE — Progress Notes (Signed)
Lab. Notified this RN that MRSA by PCR was not detected. PCP will be notified.

## 2020-11-15 NOTE — Progress Notes (Signed)
Allergies  [facility] PROGRESS NOTE   Jenna [name] EKC:[id number] DOB: Feb 02, 1956 DOA: 11/13/2020  PCP: [name], Betty G, MD  Brief History/Interval Summary: Jenna [name] is a 65 y.o. female with medical history significant of anemia, CAD, depression, chronic kidney disease stage IV, status post renal transplant on immunosuppressants, hypertension, focal segmental glomerularsclerosis, systolic CHF EF 50-56, lupus nephritis, ischemic cardiomyopathy, history of LA thrombus, obesity, paroxysmal atrial fibrillation, CVA of parietal lobe, known CAD status post DES to LAD in September 2020.  She was admitted earlier this month for acute kidney injury, pyelonephritis, Pseudomonas UTI, melanotic stools and acute systolic CHF.  Also was noted to have large right-sided pleural effusion.  She was stabilized and then discharged to a skilled nursing facility on 9/28.  Patient presented with complaints of bilateral leg pain and swelling, abdominal pain nausea chest pain.  She felt that she had not really improved.   Consultants: Cardiology.  Nephrology  Procedures: None yet    Subjective/Interval History: Patient mentions that she is feeling slightly better in terms of her shortness of breath.  Continues to have pain in her right upper extremity.      Assessment/Plan:  Acute on chronic systolic CHF EF is known to be about 20 to 25%.  Patient is followed by cardiology at [facility].  She was noted to have bilateral lower extremity edema and JVD.  She was given furosemide in the emergency department and was continued.   Cardiology continues to follow.  Strict ins and outs.  Monitor daily weights. Mildly elevated troponin levels noted likely due to CHF/demand ischemia. No ACE inhibitor or ARB due to renal failure.  Noted to be on BiDil.  Chronic kidney disease stage IV/history of renal transplant Creatinine noted to be close to what it was when she was recently  hospitalized.  Not much urine output has been recorded though patient mentions that she did urinate overnight.   Nephrology is following.  Patient is on tacrolimus and steroids which is being continued.  Patient also on valganciclovir for history of CMV.  Pneumonia, aspiration versus community-acquired Noted to be on ceftriaxone and azithromycin.  Changed over to [facility] and oral azithromycin.  Acute metabolic encephalopathy Likely due to her several comorbidities.  No acute findings noted in terms of neurological status.  She did undergo MRI of the brain which did not show any acute infarcts.  Reorient daily.  Will eventually need PT and OT evaluation. Mentation seems to be better today.  History of paroxysmal atrial fibrillation Beta-blocker on hold due to bradycardia.  Home medication list does not show that she is on anticoagulation.  Noted to be just on aspirin.  Looks like she was on Eliquis previously but was not continued due to concern for GI bleed. Remains bradycardic.  Continue to monitor.  Thrombocytopenia Chronic and stable.  No evidence of overt bleeding.  History of SLE with history of lupus nephritis Noted to be on Plaquenil which is being continued.  Hypertension in the setting of chronic kidney disease stage IV  Metoprolol on hold due to bradycardia.  Monitor blood pressures closely.  History of coronary artery disease Followed at [facility].  Continue aspirin.  Continue statin  Anemia of chronic disease No evidence of overt bleeding currently.  Hemoglobin has been stable.  Recent Pseudomonas UTI/pyelonephritis involving transplanted kidney Recently completed 7-day course of IV antibiotics with meropenem.  Leukopenia Chronic for the most part.  Continue to monitor for now.  Could  be related to her medications.  Poor venous access Her IV in the right arm infiltrated while she was getting azithromycin.  The arm is swollen.  Will request nursing staff to apply  warm compresses.  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  But nephrology is okay to try venous access around the left wrist and hand.  Okay to draw blood from that area as well.  Minimize blood draws.  But not check her CBC daily.  Obesity Estimated body mass index is 32.49 kg/m as calculated from the following:   Height as of this encounter: $RemoveBeforeD'5\' 7"'YyDgqEddvMudDH$  (1.702 m).   Weight as of this encounter: 94.1 kg.    DVT Prophylaxis: SCDs for now Code Status: Full code Family Communication: Discussed with patient.  No family at bedside Disposition Plan: From SNF  Status is: Inpatient  Remains inpatient appropriate because:IV treatments appropriate due to intensity of illness or inability to take PO and Inpatient level of care appropriate due to severity of illness  Dispo: The patient is from: SNF              Anticipated d/c is to: SNF              Patient currently is not medically stable to d/c.   Difficult to place patient No        Medications: Scheduled:  aspirin EC  81 mg Oral q morning   azithromycin  500 mg Oral Daily   cefdinir  300 mg Oral Q24H   citalopram  10 mg Oral Daily   furosemide  60 mg Intravenous BID   hydroxychloroquine  200 mg Oral BID   isosorbide-hydrALAZINE  1 tablet Oral TID   pantoprazole  40 mg Oral BID   predniSONE  5 mg Oral Daily   rosuvastatin  5 mg Oral q1800   sodium bicarbonate  650 mg Oral TID   sodium chloride flush  3 mL Intravenous Q12H   tacrolimus  0.5 mg Oral BID   valGANciclovir  450 mg Oral QODAY   Continuous:  sodium chloride     NFA:OZHYQM chloride, acetaminophen **OR** acetaminophen, albuterol, ALPRAZolam, sodium chloride flush  Antibiotics: Anti-infectives (From admission, onward)    Start     Dose/Rate Route Frequency Ordered Stop   11/15/20 1000  valGANciclovir (VALCYTE) 450 MG tablet TABS 450 mg        450 mg Oral Every other day 11/13/20 2328     11/15/20 0800  azithromycin (ZITHROMAX) tablet 500 mg        500 mg Oral  Daily 11/14/20 0959     11/14/20 2200  cefdinir (OMNICEF) capsule 300 mg        300 mg Oral Every 24 hours 11/14/20 0959     11/14/20 0015  hydroxychloroquine (PLAQUENIL) tablet 200 mg        200 mg Oral 2 times daily 11/13/20 2328     11/13/20 2300  cefTRIAXone (ROCEPHIN) 2 g in sodium chloride 0.9 % 100 mL IVPB  Status:  Discontinued        2 g 200 mL/hr over 30 Minutes Intravenous Every 24 hours 11/13/20 2230 11/14/20 0959   11/13/20 2300  azithromycin (ZITHROMAX) 500 mg in sodium chloride 0.9 % 250 mL IVPB  Status:  Discontinued        500 mg 250 mL/hr over 60 Minutes Intravenous Every 24 hours 11/13/20 2230 11/14/20 0959       Objective:  Vital Signs  Vitals:  11/14/20 1900 11/14/20 2000 11/15/20 0425 11/15/20 0500  BP: 139/73 (!) 152/78  134/68  Pulse: (!) 59 62  60  Resp: $Remo'20 14  15  'rGvmJ$ Temp: (!) 97.5 F (36.4 C) (!) 97.5 F (36.4 C)  97.8 F (36.6 C)  TempSrc: Oral Oral  Oral  SpO2: 98% 98%  97%  Weight:   94.1 kg   Height:        Intake/Output Summary (Last 24 hours) at 11/15/2020 1054 Last data filed at 11/14/2020 2300 Gross per 24 hour  Intake 240 ml  Output 350 ml  Net -110 ml    Filed Weights   11/13/20 1336 11/14/20 0313 11/15/20 0425  Weight: 92.9 kg 94.5 kg 94.1 kg    General appearance: Awake alert.  In no distress Resp: Normal effort at rest.  Few crackles bilateral bases.  No wheezing or rhonchi. Cardio: S1-S2 is normal regular.  No S3-S4.  No rubs murmurs or bruit GI: Abdomen is soft.  Nontender nondistended.  Bowel sounds are present normal.  No masses organomegaly Extremities: Right arm appears to be slightly less swollen today compared to yesterday.  Remains tender.  No good peripheral pulses.  Limited range of motion of the right elbow due to the swelling. Neurologic:  No focal neurological deficits.   Lab Results:  Data Reviewed: I have personally reviewed following labs and imaging studies  CBC: Recent Labs  Lab 11/10/20 0223  11/11/20 0601 11/13/20 1442 11/13/20 1623 11/14/20 0548 11/15/20 0437  WBC 2.3* 2.2* 1.8*  --  1.4* 1.5*  NEUTROABS 1.6* 1.3* 1.0*  --  0.9* 0.8*  HGB 11.0* 10.8* 11.7* 13.9 11.9* 12.0  HCT 33.1* 33.1* 36.2 41.0 37.1 37.8  MCV 89.9 90.4 91.4  --  93.0 91.5  PLT 71* 65* 86*  --  71* 66*     Basic Metabolic Panel: Recent Labs  Lab 11/09/20 0539 11/10/20 0223 11/11/20 0601 11/11/20 1044 11/13/20 1442 11/13/20 1612 11/13/20 1623 11/14/20 0548 11/15/20 0437  NA 132* 134* 133* 133* SPECIMEN HEMOLYZED. HEMOLYSIS MAY AFFECT INTEGRITY OF RESULTS. 140 136 137 134*  K 4.2 4.4 6.1* 4.7 SPECIMEN HEMOLYZED. HEMOLYSIS MAY AFFECT INTEGRITY OF RESULTS. 4.2 4.8 4.3 4.5  CL 102 103 104 104 SPECIMEN HEMOLYZED. HEMOLYSIS MAY AFFECT INTEGRITY OF RESULTS. 110 110 104 103  CO2 19* 18* 17* 15* SPECIMEN HEMOLYZED. HEMOLYSIS MAY AFFECT INTEGRITY OF RESULTS. 19*  --  18* 19*  GLUCOSE 91 78 82 75 SPECIMEN HEMOLYZED. HEMOLYSIS MAY AFFECT INTEGRITY OF RESULTS. 97 92 79 81  BUN 74* 75* 80* 79* SPECIMEN HEMOLYZED. HEMOLYSIS MAY AFFECT INTEGRITY OF RESULTS. 88* 97* 85* 90*  CREATININE 3.62* 3.40* 3.53* 3.47* SPECIMEN HEMOLYZED. HEMOLYSIS MAY AFFECT INTEGRITY OF RESULTS. 3.59* 3.80* 3.54* 3.59*  CALCIUM 9.3 9.3 9.4 9.7 SPECIMEN HEMOLYZED. HEMOLYSIS MAY AFFECT INTEGRITY OF RESULTS. 10.4*  --  9.8 9.8  MG 2.5* 2.4 2.5*  --   --   --   --  2.6* 2.5*  PHOS 4.5 4.6 4.8*  --   --   --   --  5.6* 5.9*     GFR: Estimated Creatinine Clearance: 18.4 mL/min (A) (by C-G formula based on SCr of 3.59 mg/dL (H)).  Liver Function Tests: Recent Labs  Lab 11/10/20 0223 11/11/20 0601 11/13/20 1442 11/13/20 1612 11/14/20 0548 11/15/20 0437  AST 24 46* SPECIMEN HEMOLYZED. HEMOLYSIS MAY AFFECT INTEGRITY OF RESULTS. 32 27  --   ALT 20 19 SPECIMEN HEMOLYZED. HEMOLYSIS MAY AFFECT INTEGRITY OF RESULTS. 18 16  --  ALKPHOS 39 88 SPECIMEN HEMOLYZED. HEMOLYSIS MAY AFFECT INTEGRITY OF RESULTS. 139* 125  --   BILITOT 1.5* 1.1  SPECIMEN HEMOLYZED. HEMOLYSIS MAY AFFECT INTEGRITY OF RESULTS. 1.5* 1.4*  --   PROT 6.3* 6.0* SPECIMEN HEMOLYZED. HEMOLYSIS MAY AFFECT INTEGRITY OF RESULTS. 7.6 6.9  --   ALBUMIN 2.9* 2.8* SPECIMEN HEMOLYZED. HEMOLYSIS MAY AFFECT INTEGRITY OF RESULTS. 3.5 3.2* 3.3*     Recent Labs  Lab 11/13/20 1612  LIPASE 22      Cardiac Enzymes: Recent Labs  Lab 11/14/20 0548  CKTOTAL 55       Recent Results (from the past 240 hour(s))  SARS CORONAVIRUS 2 (TAT 6-24 HRS) Nasopharyngeal Nasopharyngeal Swab     Status: None   Collection Time: 11/11/20 12:51 PM   Specimen: Nasopharyngeal Swab  Result Value Ref Range Status   SARS Coronavirus 2 NEGATIVE NEGATIVE Final    Comment: (NOTE) SARS-CoV-2 target nucleic acids are NOT DETECTED.  The SARS-CoV-2 RNA is generally detectable in upper and lower respiratory specimens during the acute phase of infection. Negative results do not preclude SARS-CoV-2 infection, do not rule out co-infections with other pathogens, and should not be used as the sole basis for treatment or other patient management decisions. Negative results must be combined with clinical observations, patient history, and epidemiological information. The expected result is Negative.  Fact Sheet for Patients: SugarRoll.be  Fact Sheet for Healthcare Providers: https://www.woods-mathews.com/  This test is not yet approved or cleared by the [place] FDA and  has been authorized for detection and/or diagnosis of SARS-CoV-2 by FDA under an Emergency Use Authorization (EUA). This EUA will remain  in effect (meaning this test can be used) for the duration of the COVID-19 declaration under Se ction 564(b)(1) of the Act, 21 U.S.C. section 360bbb-3(b)(1), unless the authorization is terminated or revoked sooner.  Performed at [facility], [place] [address], [place], [place] [address]   Resp Panel by RT-PCR (Flu A&B, Covid)  Nasopharyngeal Swab     Status: None   Collection Time: 11/13/20  5:49 PM   Specimen: Nasopharyngeal Swab; Nasopharyngeal(NP) swabs in vial transport medium  Result Value Ref Range Status   SARS Coronavirus 2 by RT PCR NEGATIVE NEGATIVE Final    Comment: (NOTE) SARS-CoV-2 target nucleic acids are NOT DETECTED.  The SARS-CoV-2 RNA is generally detectable in upper respiratory specimens during the acute phase of infection. The lowest concentration of SARS-CoV-2 viral copies this assay can detect is 138 copies/mL. A negative result does not preclude SARS-Cov-2 infection and should not be used as the sole basis for treatment or other patient management decisions. A negative result may occur with  improper specimen collection/handling, submission of specimen other than nasopharyngeal swab, presence of viral mutation(s) within the areas targeted by this assay, and inadequate number of viral copies(<138 copies/mL). A negative result must be combined with clinical observations, patient history, and epidemiological information. The expected result is Negative.  Fact Sheet for Patients:  EntrepreneurPulse.com.au  Fact Sheet for Healthcare Providers:  IncredibleEmployment.be  This test is no t yet approved or cleared by the [place] FDA and  has been authorized for detection and/or diagnosis of SARS-CoV-2 by FDA under an Emergency Use Authorization (EUA). This EUA will remain  in effect (meaning this test can be used) for the duration of the COVID-19 declaration under Section 564(b)(1) of the Act, 21 U.S.C.section 360bbb-3(b)(1), unless the authorization is terminated  or revoked sooner.       Influenza A by  PCR NEGATIVE NEGATIVE Final   Influenza B by PCR NEGATIVE NEGATIVE Final    Comment: (NOTE) The Xpert Xpress SARS-CoV-2/FLU/RSV plus assay is intended as an aid in the diagnosis of influenza from Nasopharyngeal swab specimens and should not be  used as a sole basis for treatment. Nasal washings and aspirates are unacceptable for Xpert Xpress SARS-CoV-2/FLU/RSV testing.  Fact Sheet for Patients: EntrepreneurPulse.com.au  Fact Sheet for Healthcare Providers: IncredibleEmployment.be  This test is not yet approved or cleared by the [place] FDA and has been authorized for detection and/or diagnosis of SARS-CoV-2 by FDA under an Emergency Use Authorization (EUA). This EUA will remain in effect (meaning this test can be used) for the duration of the COVID-19 declaration under Section 564(b)(1) of the Act, 21 U.S.C. section 360bbb-3(b)(1), unless the authorization is terminated or revoked.  Performed at [facility], [place] [address] [place], [place] [address]   Culture, blood (Routine X 2) w Reflex to ID Panel     Status: None (Preliminary result)   Collection Time: 11/14/20  7:11 AM   Specimen: BLOOD RIGHT HAND  Result Value Ref Range Status   Specimen Description   Final    BLOOD RIGHT HAND Performed at [facility] [address], [place], [place] [address]    Special Requests   Final    BOTTLES DRAWN AEROBIC ONLY Blood Culture results may not be optimal due to an inadequate volume of blood received in culture bottles Performed at [facility] [address] [place], [place] [address]    Culture   Final    NO GROWTH < 24 HOURS Performed at [facility] [address], [place], [place] [address]    Report Status PENDING  Incomplete  MRSA Next Gen by PCR, Nasal     Status: None   Collection Time: 11/14/20  6:00 PM   Specimen: Nasal Mucosa; Nasal Swab  Result Value Ref Range Status   MRSA by PCR Next Gen NOT DETECTED NOT DETECTED Final    Comment: RESULT CALLED TO, READ BACK BY AND VERIFIED WITH: SCOTPON J $RemoveB'@0535'QgQfMblj$  BY BATTLET Performed at [facility] [address], [place], [place] [address]         Radiology Studies: DG Chest 2 View  Result Date: 11/13/2020 CLINICAL DATA:  Chest pain and shortness of breath. Bilateral leg pain and swelling. EXAM: CHEST - 2 VIEW COMPARISON:  11/10/2020 FINDINGS: Persistent airspace opacities in the right middle lobe both lower lobes. The airspace opacity in the left lower lobe and in the right middle lobe is mildly worsened, with a reduced amount of aeration in the involved regions. There is at least mild enlargement of the cardiopericardial silhouette. Mild prominence of upper zone pulmonary vasculature may reflect pulmonary venous hypertension. Linear subsegmental atelectasis in the left mid lung appears stable. IMPRESSION: 1. Worsened bibasilar airspace opacities, suspicious for pneumonia or aspiration. 2. Cardiomegaly with pulmonary venous hypertension. Electronically Signed   By: Van Clines M.D.   On: 11/13/2020 14:54   CT HEAD WO CONTRAST (5MM)  Result Date: 11/13/2020 CLINICAL DATA:  Dizziness Left-sided weakness EXAM: CT HEAD WITHOUT CONTRAST TECHNIQUE: Contiguous axial images were obtained from the base of the skull through the vertex without intravenous contrast. COMPARISON:  None. FINDINGS: Brain: No acute intracranial hemorrhage. No midline shift or hydrocephalus. Interval development of low-density in the right occipital lobe white matter. Vascular: No hyperdense vessel or unexpected calcification. Skull: Normal. Negative for fracture  or focal lesion. Sinuses/Orbits: No acute finding. Other: None. IMPRESSION: 1. No acute intracranial hemorrhage. 2. Interval development of white matter hypodensity in the right occipital lobe. While this may be due to interval infarct, findings may also be the result of vasogenic edema related to underlying mass. Further evaluation with contrast-enhanced MRI of the brain should be performed when patient condition allows. Electronically Signed   By: Miachel Roux M.D.   On: 11/13/2020 15:51   MR Brain W and  Wo Contrast  Result Date: 11/13/2020 CLINICAL DATA:  Dizziness and possible brain mass EXAM: MRI HEAD WITHOUT AND WITH CONTRAST TECHNIQUE: Multiplanar, multiecho pulse sequences of the brain and surrounding structures were obtained without and with intravenous contrast. CONTRAST:  15mL GADAVIST GADOBUTROL 1 MMOL/ML IV SOLN COMPARISON:  Head CT 11/13/2020 Brain MRI 05/05/2013 FINDINGS: Brain: No acute infarct, mass effect or extra-axial collection. No acute or chronic hemorrhage. There is hyperintense T2-weighted signal in the posterior right parietal lobe, likely an old infarct. Mild periventricular white matter hyperintense T2-weighted signal. The midline structures are normal. Vascular: Major flow voids are preserved. Skull and upper cervical spine: Normal calvarium and skull base. Visualized upper cervical spine and soft tissues are normal. Sinuses/Orbits:No paranasal sinus fluid levels or advanced mucosal thickening. No mastoid or middle ear effusion. Normal orbits. IMPRESSION: 1. No acute intracranial abnormality.  No mass lesion. 2. 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 Electronically Signed   By: Ulyses Jarred M.D.   On: 11/13/2020 21:25   CT Renal Stone Study  Result Date: 11/13/2020 CLINICAL DATA:  Bilateral leg pain and swelling that began 2-3 hours, abdominal pain, nausea EXAM: CT ABDOMEN AND PELVIS WITHOUT CONTRAST TECHNIQUE: Multidetector CT imaging of the abdomen and pelvis was performed following the standard protocol without IV contrast. COMPARISON:  CT abdomen/pelvis 11/01/2020, MR abdomen 11/03/2020 FINDINGS: Lower chest: There are bilateral pleural effusions, increased since 11/01/2020. Patchy opacities in the left lower lobe are increased since the prior study are suspicious for infection or aspiration. The heart is enlarged. Coronary artery calcifications are again seen. Hepatobiliary: The liver is unremarkable, within the confines of  noncontrast technique. The previously reported abnormality seen on the abdominal ultrasound from 11/01/2020 is not identified. The gallbladder is contracted but again demonstrates a thickened wall and mild pericholecystic fat stranding. Pancreas: Unremarkable. Spleen: Unremarkable. Adrenals/Urinary Tract: The adrenals are unremarkable. The native kidneys are atrophic, with no hydronephrosis or hydroureter. A transplant kidney is again seen in the right lower quadrant. There is prominence of the transplant ureter and pelvis with mild stranding in the surrounding fat. There are no focal lesions or stones seen in the transplant kidney or ureter. Overall, the findings are similar to the study from 11/01/2020. The bladder is decompressed but grossly unremarkable. Stomach/Bowel: The stomach is unremarkable. There is no evidence of bowel obstruction. There is no evidence of abnormal bowel wall thickening or inflammatory change. There is fluid in the distal colon/rectum. There are scattered colonic diverticula without evidence of acute diverticulitis. Vascular/Lymphatic: There is calcified atherosclerotic plaque throughout the nonaneurysmal abdominal aorta. There is no abdominal or pelvic lymphadenopathy. Reproductive: A calcification in the uterus likely reflects a fibroid. There is no adnexal mass. Other: There is trace free fluid in the pelvis, nonspecific and not significantly changed. There is no free air. There is diffuse body wall edema, similar to the prior study. Musculoskeletal: There is no acute osseous abnormality or aggressive osseous lesion. IMPRESSION: 1. Overall, no new acute finding  in the abdomen or pelvis compared to the study from 11/01/2020. 2. Similar appearance of the transplant kidney in the right lower quadrant with unchanged prominence of the pelvis and ureter with no stone or other obstructing lesion seen. There is mild stranding in the surrounding fat, also similar to the prior study. Correlate  with symptoms and laboratory values if there is concern for pyelonephritis. 3. The gallbladder again demonstrates mild wall thickening and pericholecystic fat stranding. This is again nonspecific and could be due to heart failure. This could be evaluated with HIDA scan if there is clinical concern for acute cholecystitis. 4. Bilateral pleural effusions, increased since 11/01/2020, with increased patchy opacities in the left base suspicious for infection or aspiration. 5. Fluid in the colon which can be seen in the setting of diarrhea. 6. Diffuse body wall edema and trace free fluid in the pelvis, similar to the prior study. 7. Unchanged cardiomegaly. 8.  Aortic Atherosclerosis (ICD10-I70.0). Electronically Signed   By: Valetta Mole M.D.   On: 11/13/2020 15:59       LOS: 1 day   [facility] Pager on [email or web address]  11/15/2020, 10:54 AM

## 2020-11-15 NOTE — Progress Notes (Signed)
Patient ID: Jenna [name], female   DOB: November 22, 1955, 65 y.o.   MRN: [id number] [facility] Progress Note   Assessment/ Plan:   1.  Chronic kidney disease stage IV T: Renal function relatively stable overnight with poorly charted urine output.  Continue with tacrolimus and prednisone for kidney transplant immunosuppression and awaiting tacrolimus trough level after recent decrease in dose last week. 2.  Acute exacerbation of systolic heart failure: Symptomatically appears to be doing better with poorly charted urine output-renal function essentially unchanged overnight and weight down by 1 kg overnight.  Continue intravenous furosemide 60 mg twice daily. 3.  History of cytomegalovirus viremia: Continue valganciclovir with renal adjustment. 4.  Anemia of chronic disease: Without overt blood loss and stable hemoglobin and hematocrit overnight.  Stable leukopenia with ANC 800; will give myeloid growth factor if <500. 5.  Mixed gap/non-anion gap metabolic acidosis: Secondary to advancing allograft dysfunction/chronic kidney disease and will continue treatment with sodium bicarbonate. 6.  Systemic lupus erythematosus: On tacrolimus and prednisone (intended for immunosuppression status post renal transplant) along with hydroxychloroquine. 7.  Pneumonia: Suspected aspiration versus community-acquired-on azithromycin and ceftriaxone at this time.  Subjective:   Recalls events from the day skilled nursing that may have led to admission.  Urine output appears incompletely charted because she has intermittent diarrhea when passing urine.   Objective:   BP 134/68 (BP Location: Right Wrist)   Pulse 60   Temp 97.8 F (36.6 C) (Oral)   Resp 15   Ht $R'5\' 7"'Rl$  (1.702 m)   Wt 94.1 kg   SpO2 97%   BMI 32.49 kg/m   Intake/Output Summary (Last 24 hours) at 11/15/2020 1030 Last data filed at 11/14/2020 2300 Gross per 24 hour  Intake 240 ml  Output 350 ml  Net -110 ml   Weight change: 1.249  kg  Physical Exam: Gen: Chronically ill-appearing, resting in bed.  Rambling. CVS: Pulse regular rhythm, normal rate, S1 and S2 with ejection systolic murmur Resp: Fine rales right posterior lung field, decreased breath sounds over bases Abd: Soft, obese, nontender, bowel sounds normal Ext: 2+ bilateral lower extremity edema, left BCF.  Decreasing right upper extremity edema swelling.  Imaging: DG Chest 2 View  Result Date: 11/13/2020 CLINICAL DATA:  Chest pain and shortness of breath. Bilateral leg pain and swelling. EXAM: CHEST - 2 VIEW COMPARISON:  11/10/2020 FINDINGS: Persistent airspace opacities in the right middle lobe both lower lobes. The airspace opacity in the left lower lobe and in the right middle lobe is mildly worsened, with a reduced amount of aeration in the involved regions. There is at least mild enlargement of the cardiopericardial silhouette. Mild prominence of upper zone pulmonary vasculature may reflect pulmonary venous hypertension. Linear subsegmental atelectasis in the left mid lung appears stable. IMPRESSION: 1. Worsened bibasilar airspace opacities, suspicious for pneumonia or aspiration. 2. Cardiomegaly with pulmonary venous hypertension. Electronically Signed   By: Van Clines M.D.   On: 11/13/2020 14:54   CT HEAD WO CONTRAST (5MM)  Result Date: 11/13/2020 CLINICAL DATA:  Dizziness Left-sided weakness EXAM: CT HEAD WITHOUT CONTRAST TECHNIQUE: Contiguous axial images were obtained from the base of the skull through the vertex without intravenous contrast. COMPARISON:  None. FINDINGS: Brain: No acute intracranial hemorrhage. No midline shift or hydrocephalus. Interval development of low-density in the right occipital lobe white matter. Vascular: No hyperdense vessel or unexpected calcification. Skull: Normal. Negative for fracture or focal lesion. Sinuses/Orbits: No acute finding. Other: None. IMPRESSION: 1. No acute intracranial hemorrhage.  2. Interval development  of white matter hypodensity in the right occipital lobe. While this may be due to interval infarct, findings may also be the result of vasogenic edema related to underlying mass. Further evaluation with contrast-enhanced MRI of the brain should be performed when patient condition allows. Electronically Signed   By: Miachel Roux M.D.   On: 11/13/2020 15:51   MR Brain W and Wo Contrast  Result Date: 11/13/2020 CLINICAL DATA:  Dizziness and possible brain mass EXAM: MRI HEAD WITHOUT AND WITH CONTRAST TECHNIQUE: Multiplanar, multiecho pulse sequences of the brain and surrounding structures were obtained without and with intravenous contrast. CONTRAST:  103mL GADAVIST GADOBUTROL 1 MMOL/ML IV SOLN COMPARISON:  Head CT 11/13/2020 Brain MRI 05/05/2013 FINDINGS: Brain: No acute infarct, mass effect or extra-axial collection. No acute or chronic hemorrhage. There is hyperintense T2-weighted signal in the posterior right parietal lobe, likely an old infarct. Mild periventricular white matter hyperintense T2-weighted signal. The midline structures are normal. Vascular: Major flow voids are preserved. Skull and upper cervical spine: Normal calvarium and skull base. Visualized upper cervical spine and soft tissues are normal. Sinuses/Orbits:No paranasal sinus fluid levels or advanced mucosal thickening. No mastoid or middle ear effusion. Normal orbits. IMPRESSION: 1. No acute intracranial abnormality.  No mass lesion. 2. 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 Electronically Signed   By: Ulyses Jarred M.D.   On: 11/13/2020 21:25   CT Renal Stone Study  Result Date: 11/13/2020 CLINICAL DATA:  Bilateral leg pain and swelling that began 2-3 hours, abdominal pain, nausea EXAM: CT ABDOMEN AND PELVIS WITHOUT CONTRAST TECHNIQUE: Multidetector CT imaging of the abdomen and pelvis was performed following the standard protocol without IV contrast. COMPARISON:  CT abdomen/pelvis 11/01/2020,  MR abdomen 11/03/2020 FINDINGS: Lower chest: There are bilateral pleural effusions, increased since 11/01/2020. Patchy opacities in the left lower lobe are increased since the prior study are suspicious for infection or aspiration. The heart is enlarged. Coronary artery calcifications are again seen. Hepatobiliary: The liver is unremarkable, within the confines of noncontrast technique. The previously reported abnormality seen on the abdominal ultrasound from 11/01/2020 is not identified. The gallbladder is contracted but again demonstrates a thickened wall and mild pericholecystic fat stranding. Pancreas: Unremarkable. Spleen: Unremarkable. Adrenals/Urinary Tract: The adrenals are unremarkable. The native kidneys are atrophic, with no hydronephrosis or hydroureter. A transplant kidney is again seen in the right lower quadrant. There is prominence of the transplant ureter and pelvis with mild stranding in the surrounding fat. There are no focal lesions or stones seen in the transplant kidney or ureter. Overall, the findings are similar to the study from 11/01/2020. The bladder is decompressed but grossly unremarkable. Stomach/Bowel: The stomach is unremarkable. There is no evidence of bowel obstruction. There is no evidence of abnormal bowel wall thickening or inflammatory change. There is fluid in the distal colon/rectum. There are scattered colonic diverticula without evidence of acute diverticulitis. Vascular/Lymphatic: There is calcified atherosclerotic plaque throughout the nonaneurysmal abdominal aorta. There is no abdominal or pelvic lymphadenopathy. Reproductive: A calcification in the uterus likely reflects a fibroid. There is no adnexal mass. Other: There is trace free fluid in the pelvis, nonspecific and not significantly changed. There is no free air. There is diffuse body wall edema, similar to the prior study. Musculoskeletal: There is no acute osseous abnormality or aggressive osseous lesion.  IMPRESSION: 1. Overall, no new acute finding in the abdomen or pelvis compared to the study from 11/01/2020. 2. Similar appearance  of the transplant kidney in the right lower quadrant with unchanged prominence of the pelvis and ureter with no stone or other obstructing lesion seen. There is mild stranding in the surrounding fat, also similar to the prior study. Correlate with symptoms and laboratory values if there is concern for pyelonephritis. 3. The gallbladder again demonstrates mild wall thickening and pericholecystic fat stranding. This is again nonspecific and could be due to heart failure. This could be evaluated with HIDA scan if there is clinical concern for acute cholecystitis. 4. Bilateral pleural effusions, increased since 11/01/2020, with increased patchy opacities in the left base suspicious for infection or aspiration. 5. Fluid in the colon which can be seen in the setting of diarrhea. 6. Diffuse body wall edema and trace free fluid in the pelvis, similar to the prior study. 7. Unchanged cardiomegaly. 8.  Aortic Atherosclerosis (ICD10-I70.0). Electronically Signed   By: Valetta Mole M.D.   On: 11/13/2020 15:59    Labs: [employer] Recent Labs  Lab 11/09/20 0539 11/10/20 0223 11/11/20 0601 11/11/20 1044 11/13/20 1442 11/13/20 1612 11/13/20 1623 11/14/20 0548 11/15/20 0437  NA 132* 134* 133* 133* SPECIMEN HEMOLYZED. HEMOLYSIS MAY AFFECT INTEGRITY OF RESULTS. 140 136 137 134*  K 4.2 4.4 6.1* 4.7 SPECIMEN HEMOLYZED. HEMOLYSIS MAY AFFECT INTEGRITY OF RESULTS. 4.2 4.8 4.3 4.5  CL 102 103 104 104 SPECIMEN HEMOLYZED. HEMOLYSIS MAY AFFECT INTEGRITY OF RESULTS. 110 110 104 103  CO2 19* 18* 17* 15* SPECIMEN HEMOLYZED. HEMOLYSIS MAY AFFECT INTEGRITY OF RESULTS. 19*  --  18* 19*  GLUCOSE 91 78 82 75 SPECIMEN HEMOLYZED. HEMOLYSIS MAY AFFECT INTEGRITY OF RESULTS. 97 92 79 81  BUN 74* 75* 80* 79* SPECIMEN HEMOLYZED. HEMOLYSIS MAY AFFECT INTEGRITY OF RESULTS. 88* 97* 85* 90*  CREATININE 3.62* 3.40* 3.53*  3.47* SPECIMEN HEMOLYZED. HEMOLYSIS MAY AFFECT INTEGRITY OF RESULTS. 3.59* 3.80* 3.54* 3.59*  CALCIUM 9.3 9.3 9.4 9.7 SPECIMEN HEMOLYZED. HEMOLYSIS MAY AFFECT INTEGRITY OF RESULTS. 10.4*  --  9.8 9.8  PHOS 4.5 4.6 4.8*  --   --   --   --  5.6* 5.9*   CBC Recent Labs  Lab 11/11/20 0601 11/13/20 1442 11/13/20 1623 11/14/20 0548 11/15/20 0437  WBC 2.2* 1.8*  --  1.4* 1.5*  NEUTROABS 1.3* 1.0*  --  0.9* 0.8*  HGB 10.8* 11.7* 13.9 11.9* 12.0  HCT 33.1* 36.2 41.0 37.1 37.8  MCV 90.4 91.4  --  93.0 91.5  PLT 65* 86*  --  71* 66*    Medications:     aspirin EC  81 mg Oral q morning   azithromycin  500 mg Oral Daily   cefdinir  300 mg Oral Q24H   citalopram  10 mg Oral Daily   furosemide  60 mg Intravenous BID   hydroxychloroquine  200 mg Oral BID   isosorbide-hydrALAZINE  1 tablet Oral TID   pantoprazole  40 mg Oral BID   predniSONE  5 mg Oral Daily   rosuvastatin  5 mg Oral q1800   sodium bicarbonate  650 mg Oral TID   sodium chloride flush  3 mL Intravenous Q12H   tacrolimus  0.5 mg Oral BID   valGANciclovir  450 mg Oral QODAY   Elmarie Shiley, MD 11/15/2020, 10:30 AM

## 2020-11-16 DIAGNOSIS — Z515 Encounter for palliative care: Secondary | ICD-10-CM

## 2020-11-16 DIAGNOSIS — Z7189 Other specified counseling: Secondary | ICD-10-CM

## 2020-11-16 DIAGNOSIS — I5023 Acute on chronic systolic (congestive) heart failure: Secondary | ICD-10-CM | POA: Diagnosis not present

## 2020-11-16 DIAGNOSIS — N185 Chronic kidney disease, stage 5: Secondary | ICD-10-CM | POA: Diagnosis not present

## 2020-11-16 DIAGNOSIS — D638 Anemia in other chronic diseases classified elsewhere: Secondary | ICD-10-CM | POA: Diagnosis not present

## 2020-11-16 DIAGNOSIS — N179 Acute kidney failure, unspecified: Secondary | ICD-10-CM

## 2020-11-16 DIAGNOSIS — I480 Paroxysmal atrial fibrillation: Secondary | ICD-10-CM | POA: Diagnosis not present

## 2020-11-16 LAB — BASIC METABOLIC PANEL
Anion gap: 16 — ABNORMAL HIGH (ref 5–15)
BUN: 97 mg/dL — ABNORMAL HIGH (ref 8–23)
CO2: 18 mmol/L — ABNORMAL LOW (ref 22–32)
Calcium: 10 mg/dL (ref 8.9–10.3)
Chloride: 107 mmol/L (ref 98–111)
Creatinine, Ser: 3.95 mg/dL — ABNORMAL HIGH (ref 0.44–1.00)
GFR, Estimated: 12 mL/min — ABNORMAL LOW (ref >60)
Glucose, Bld: 88 mg/dL (ref 70–99)
Potassium: 4.5 mmol/L (ref 3.5–5.1)
Sodium: 141 mmol/L (ref 135–145)

## 2020-11-16 MED ORDER — TORSEMIDE 20 MG PO TABS
20.0000 mg | ORAL_TABLET | Freq: Two times a day (BID) | ORAL | Status: DC
  Administered 2020-11-16 – 2020-11-18 (×4): 20 mg via ORAL
  Filled 2020-11-16 (×6): qty 1

## 2020-11-16 MED ORDER — NEPRO/CARBSTEADY PO LIQD
237.0000 mL | Freq: Three times a day (TID) | ORAL | Status: DC
  Administered 2020-11-16 – 2020-11-23 (×5): 237 mL via ORAL
  Filled 2020-11-16 (×25): qty 237

## 2020-11-16 NOTE — Consult Note (Signed)
Consultation Note Date: 11/16/2020   Patient Name: Jenna [name]  DOB: [birth year]  MRN: [id number]  Age / Sex: 65 y.o., female  PCP: [name], Betty G, MD Referring Physician: Bonnielee Haff, MD  Reason for Consultation: Establishing goals of care  HPI/Patient Profile: 65 y.o. female  with past medical history of CHF EF 20-25%, CAD, paroxysmal atrial fibrillation, h/o LA thrombus, lupus nephritis, focal segmental glomerularsclerosis, s/p renal transplant on immunosuppressants, stroke admitted on 11/13/2020 with bilateral leg pain and swelling, abd pain, nausea, chest pain related to CHF exacerbation, acute on chronic renal failure, pneumonia and with mencephalopathy. Admitted recently with acute kidney injury, pyelonephritis, Pseudomonas UTI, melanotic stools, acute systolic CHF, large right-sided pleural effusion and d/c SNF 9/28.   Clinical Assessment and Goals of Care: I met this morning with Jenna [name] and no family at bedside. She is frustrated with being very cold and cannot get comfortable in chair. I got her some warm blankets which she reported helped. I attempted to speak with her about her condition and complications. She understands that her heart is weak and kidneys are severely dysfunctional but not sure she understands the limited options to improve her condition and the severity of her illness. She does share that she is hopeful that the medicine will be more helpful and wants to avoid dialysis. She has trouble understanding why the medication she was taking was working and why it isn't now. She gives me permission to call and speak with her daughter, Jenna [name], who she relies on.   I visited later in the day and Jenna [name] slept as I had a good conversation with Jenna [name]. Jenna [name] and I reviewed the severity of her mother's complicated illness with heart and kidney failure. Jenna [name] has good understanding of the  complexity of these issues. We had an honest conversation about the limitations of medicine and that even if her mother were to require dialysis in the future that her heart may not allow her to tolerate dialysis. Jenna [name] understands the importance of planning ahead and she is trying to prepare herself for what to expect with her mother's health. Unfortunately she lost her father earlier this year. She is also pregnant with her first child due in November 30th (Ms. Constantino first grandchild). It is important that Ms. Riffe is able to meet her granddaughter.   Jenna [name] and I discuss preparing her and having assistance from other family/friends to support them both through this process. Will involve some other family in conversation as well and slowly explain to [name]. Jenna [name] is not sure her mother is ready to hear about the severity of her condition. We also discussed outpatient palliative to continue conversation as sometimes this is easier for people to discuss outside hospital setting.   All questions/concerns addressed. Emotional support provided.   Primary Decision Maker PATIENT with assistance from daughter    [place]   - Ongoing conversations needed - Outpatient palliative care  Code Status/Advance Care Planning: Full code   Symptom Management:  Per attending,  renal, cardiology  Palliative Prophylaxis:  Delirium Protocol, Frequent Pain Assessment, and Turn Reposition  Psycho-social/Spiritual:  Desire for further Chaplaincy support:yes Additional Recommendations: Caregiving  Support/Resources and Grief/Bereavement Support  Prognosis:  Overall prognosis poor with advanced cardiac and renal failure.   Discharge Planning: [facility] for rehab with Palliative care service follow-up      Primary Diagnoses: Present on Admission:  Fluid overload  Thrombocytopenia ([name])  Paroxysmal atrial fibrillation ([name])  Lupus (systemic lupus erythematosus)  ([name])  Hypertension with renal disease  Elevated troponin  Dyslipidemia  Cytomegalovirus (CMV) viremia ([name])  CKD (chronic kidney disease) stage 5, GFR less than 15 ml/min ([name])  CAD (coronary artery disease)  Anemia of chronic disease  Acute on chronic systolic CHF (congestive heart failure) ([place])   I have reviewed the medical record, interviewed the patient and family, and examined the patient. The following aspects are pertinent.  Past Medical History:  Diagnosis Date   Anemia    Anxiety    panic attack- talks herself and takes deep breathes   CAD (coronary artery disease)    a. STEMI 10/2018 s/p DES to LAD.   Depression    Dyspnea    with exertion    ESRD (end stage renal disease) ([place])    hemo TTHSAT   Essential hypertension    FSGS (focal segmental glomerulosclerosis) 2011   By renal biopsy   Head injury    age 50   HFrEF (heart failure with reduced ejection fraction) ([place])    History of kidney stones    kidney stone   Hx of lupus nephritis 2011   by renal biopsy   Ischemic cardiomyopathy    Kidney transplant recipient    LA thrombus 10/2018   Lupus (systemic lupus erythematosus) ([place])    followed by Dr. 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 remote parietal infarct on brain MRI 12/2019.   Pericarditis    age 40ish   Renal stone    Secondary hyperparathyroidism of renal origin ([place])    SLE (systemic lupus erythematosus) ([name])    TIA (transient ischemic attack)    no residual effects   Social History   Socioeconomic History   Marital status: Legally Separated    Spouse name: Not on file   Number of children: 1   Years of education: 12   Highest education level: 12th grade  Occupational History   Not on file  Tobacco Use   Smoking status: Never    Passive exposure: Past   Smokeless tobacco: Never  Vaping Use   Vaping Use: Never used  Substance and Sexual  Activity   Alcohol use: No   Drug use: No   Sexual activity: Not Currently  Other Topics Concern   Not on file  Social History Narrative   Right Handed   Lives in a two story home   Daughter recently got married    Social Determinants of Health   Financial Resource Strain: Low Risk    Difficulty of Paying Living Expenses: Not very hard  Food Insecurity: No Food Insecurity   Worried About Charity fundraiser in the Last Year: Never true   Ran Out of Food in the Last Year: Never true  Transportation Needs: No Transportation Needs   Lack of Transportation (Medical): No   Lack of Transportation (Non-Medical): No  Physical Activity: Sufficiently Active  Days of Exercise per Week: 5 days   Minutes of Exercise per Session: 30 min  Stress: Stress Concern Present   Feeling of Stress : To some extent  Social Connections: Moderately Isolated   Frequency of Communication with Friends and Family: More than three times a week   Frequency of Social Gatherings with Friends and Family: More than three times a week   Attends Religious Services: 1 to 4 times per year   Active Member of [employer] or Organizations: No   Attends Music therapist: Never   Marital Status: Separated   Family History  Problem Relation Age of Onset   Diabetes Mother    Hypertension Father    Cancer Sister    Hypertension Brother    Scheduled Meds:  aspirin EC  81 mg Oral q morning   azithromycin  500 mg Oral Daily   cefdinir  300 mg Oral Q24H   citalopram  10 mg Oral Daily   furosemide  60 mg Intravenous BID   hydroxychloroquine  200 mg Oral BID   isosorbide-hydrALAZINE  1 tablet Oral TID   pantoprazole  40 mg Oral BID   predniSONE  5 mg Oral Daily   rosuvastatin  5 mg Oral q1800   sodium bicarbonate  650 mg Oral TID   sodium chloride flush  3 mL Intravenous Q12H   tacrolimus  0.5 mg Oral BID   valGANciclovir  450 mg Oral QODAY   Continuous Infusions:  sodium chloride     PRN Meds:.sodium  chloride, acetaminophen **OR** acetaminophen, albuterol, ALPRAZolam, sodium chloride flush Allergies  Allergen Reactions   Enalapril Maleate Anaphylaxis, Swelling and Other (See Comments)    Throat swells   Penicillins Other (See Comments)    Made patient lightheaded PATIENT HAS HAD A PCN REACTION WITH IMMEDIATE RASH, FACIAL/TONGUE/THROAT SWELLING, SOB, OR LIGHTHEADEDNESS WITH HYPOTENSION:  #  #  YES  #  #  Has patient had a PCN reaction causing severe rash involving mucus membranes or skin necrosis: No Has patient had a PCN reaction that required hospitalization: No Has patient had a PCN reaction occurring within the last 10 years: No If all of the above answers are "NO", then may proceed with Cephalosporin use.    Chocolate Nausea And Vomiting   Tape Itching, Rash and Other (See Comments)   Review of Systems  Constitutional:  Positive for activity change, appetite change and fatigue.  Respiratory:  Positive for shortness of breath.   Neurological:  Positive for weakness.   Physical Exam Vitals and nursing note reviewed.  Constitutional:      General: She is not in acute distress.    Appearance: She is ill-appearing.  Cardiovascular:     Rate and Rhythm: Normal rate.  Pulmonary:     Effort: No tachypnea, accessory muscle usage or respiratory distress.  Abdominal:     Palpations: Abdomen is soft.  Musculoskeletal:     Right lower leg: 3+ Edema present.     Left lower leg: 3+ Edema present.  Neurological:     Mental Status: She is alert and oriented to person, place, and time.     Comments: She understands some of her condition but not the complexity or severity. Poor understanding of the details of her illness and treatment.     Vital Signs: BP 139/80 (BP Location: Right Wrist)   Pulse 70   Temp 97.7 F (36.5 C) (Oral)   Resp 20   Ht 5' 7" (1.702 m)  Wt 88.3 kg   SpO2 98%   BMI 30.49 kg/m  Pain Scale: 0-10   Pain Score: 0-No pain   SpO2: SpO2: 98 % O2  Device:SpO2: 98 % O2 Flow Rate: .   IO: Intake/output summary:  Intake/Output Summary (Last 24 hours) at 11/16/2020 0954 Last data filed at 11/16/2020 0700 Gross per 24 hour  Intake 240 ml  Output 250 ml  Net -10 ml    LBM: Last BM Date: 11/15/20 Baseline Weight: Weight: 92.9 kg Most recent weight: Weight: 88.3 kg     Palliative Assessment/Data:    Time Total: 85 min  Greater than 50%  of this time was spent counseling and coordinating care related to the above assessment and plan.  Signed by: Vinie Sill, NP Palliative Medicine Team Pager # [phone] (M-F 8a-5p) Team Phone # [phone] (Nights/Weekends)

## 2020-11-16 NOTE — Progress Notes (Signed)
Patient ID: Jenna [name], female   DOB: 10/08/1955, 65 y.o.   MRN: [id number] [facility] Progress Note   Assessment/ Plan:   1.  Chronic kidney disease stage IV T: Renal function slightly worse overnight with poorly charted urine output/inaccurate weight-I will convert her back to torsemide (lower equivalency dose compared to IV furosemide).  Continue with tacrolimus and prednisone for kidney transplant immunosuppression and awaiting tacrolimus trough level after recent decrease in dose last week. 2.  Acute exacerbation of systolic heart failure: Unclear accuracy of urine output or weight from this morning-she continues to report sluggish improvement.   3.  History of cytomegalovirus viremia: Continue valganciclovir with renal adjustment. 4.  Anemia of chronic disease: Without overt blood loss and stable hemoglobin and hematocrit overnight.  Stable leukopenia with ANC 800; will give myeloid growth factor if <500. 5.  Mixed gap/non-anion gap metabolic acidosis: Secondary to advancing allograft dysfunction/chronic kidney disease and will continue treatment with sodium bicarbonate. 6.  Systemic lupus erythematosus: On tacrolimus and prednisone (intended for immunosuppression status post renal transplant) along with hydroxychloroquine. 7.  Pneumonia: Suspected aspiration versus community-acquired-on azithromycin and ceftriaxone at this time.  Subjective:   Complains that she woke up too early today and is having some discomfort with her leg needing repositioning while in recliner.   Objective:   BP (!) 161/93 (BP Location: Right Wrist)   Pulse 75   Temp 97.7 F (36.5 C) (Oral)   Resp 18   Ht $R'5\' 7"'lh$  (1.702 m)   Wt 88.3 kg   SpO2 100%   BMI 30.49 kg/m   Intake/Output Summary (Last 24 hours) at 11/16/2020 1132 Last data filed at 11/16/2020 1032 Gross per 24 hour  Intake 240 ml  Output 350 ml  Net -110 ml   Weight change: -5.8 kg  Physical Exam: Gen: Appears chronically  ill, uncomfortable resting up in recliner CVS: Pulse regular rhythm, normal rate, S1 and S2 with ejection systolic murmur Resp: Poor inspiratory effort with decreased breath sounds over bases Abd: Soft, obese, nontender, bowel sounds normal Ext: 2+ bilateral lower extremity edema, left BCF.  Decreasing right upper extremity edema swelling.  Imaging: No results found.  Labs: BMET Recent Labs  Lab 11/10/20 0223 11/11/20 0601 11/11/20 1044 11/13/20 1442 11/13/20 1612 11/13/20 1623 11/14/20 0548 11/15/20 0437 11/16/20 0704  NA 134* 133* 133* SPECIMEN HEMOLYZED. HEMOLYSIS MAY AFFECT INTEGRITY OF RESULTS. 140 136 137 134* 141  K 4.4 6.1* 4.7 SPECIMEN HEMOLYZED. HEMOLYSIS MAY AFFECT INTEGRITY OF RESULTS. 4.2 4.8 4.3 4.5 4.5  CL 103 104 104 SPECIMEN HEMOLYZED. HEMOLYSIS MAY AFFECT INTEGRITY OF RESULTS. 110 110 104 103 107  CO2 18* 17* 15* SPECIMEN HEMOLYZED. HEMOLYSIS MAY AFFECT INTEGRITY OF RESULTS. 19*  --  18* 19* 18*  GLUCOSE 78 82 75 SPECIMEN HEMOLYZED. HEMOLYSIS MAY AFFECT INTEGRITY OF RESULTS. 97 92 79 81 88  BUN 75* 80* 79* SPECIMEN HEMOLYZED. HEMOLYSIS MAY AFFECT INTEGRITY OF RESULTS. 88* 97* 85* 90* 97*  CREATININE 3.40* 3.53* 3.47* SPECIMEN HEMOLYZED. HEMOLYSIS MAY AFFECT INTEGRITY OF RESULTS. 3.59* 3.80* 3.54* 3.59* 3.95*  CALCIUM 9.3 9.4 9.7 SPECIMEN HEMOLYZED. HEMOLYSIS MAY AFFECT INTEGRITY OF RESULTS. 10.4*  --  9.8 9.8 10.0  PHOS 4.6 4.8*  --   --   --   --  5.6* 5.9*  --    CBC Recent Labs  Lab 11/11/20 0601 11/13/20 1442 11/13/20 1623 11/14/20 0548 11/15/20 0437  WBC 2.2* 1.8*  --  1.4* 1.5*  NEUTROABS 1.3* 1.0*  --  0.9* 0.8*  HGB 10.8* 11.7* 13.9 11.9* 12.0  HCT 33.1* 36.2 41.0 37.1 37.8  MCV 90.4 91.4  --  93.0 91.5  PLT 65* 86*  --  71* 66*    Medications:     aspirin EC  81 mg Oral q morning   azithromycin  500 mg Oral Daily   cefdinir  300 mg Oral Q24H   citalopram  10 mg Oral Daily   furosemide  60 mg Intravenous BID   hydroxychloroquine  200 mg  Oral BID   isosorbide-hydrALAZINE  1 tablet Oral TID   pantoprazole  40 mg Oral BID   predniSONE  5 mg Oral Daily   rosuvastatin  5 mg Oral q1800   sodium bicarbonate  650 mg Oral TID   sodium chloride flush  3 mL Intravenous Q12H   tacrolimus  0.5 mg Oral BID   valGANciclovir  450 mg Oral QODAY   Elmarie Shiley, MD 11/16/2020, 11:32 AM

## 2020-11-16 NOTE — Progress Notes (Signed)
Allergies  [facility] PROGRESS NOTE   Jenna [name]:[id number] DOB: Mar 17, 1955 DOA: 11/13/2020  PCP: [name], Betty G, MD  Brief History/Interval Summary: Jenna [name] is a 65 y.o. female with medical history significant of anemia, CAD, depression, chronic kidney disease stage IV, status post renal transplant on immunosuppressants, hypertension, focal segmental glomerularsclerosis, systolic CHF EF 14-78, lupus nephritis, ischemic cardiomyopathy, history of LA thrombus, obesity, paroxysmal atrial fibrillation, CVA of parietal lobe, known CAD status post DES to LAD in September 2020.  She was admitted earlier this month for acute kidney injury, pyelonephritis, Pseudomonas UTI, melanotic stools and acute systolic CHF.  Also was noted to have large right-sided pleural effusion.  She was stabilized and then discharged to a skilled nursing facility on 9/28.  Patient presented with complaints of bilateral leg pain and swelling, abdominal pain nausea chest pain.  She felt that she had not really improved.   Consultants: Cardiology.  Nephrology  Procedures: None yet    Subjective/Interval History: Patient has improved mobility in the right upper extremity.  Pain still present along with swelling.  Shortness of breath has improved.  Denies any chest pain.  Continues to have leg swelling.    Assessment/Plan:  Acute on chronic systolic CHF EF is known to be about 20 to 25%.  Patient is followed by cardiology at [facility].  She was noted to have bilateral lower extremity edema and JVD.   Remains on IV diuretics. Cardiology continues to follow.  Strict ins and outs.  Monitor daily weights.  Weight appears to be decreasing though will need to see what the trend looks like. Mildly elevated troponin levels noted likely due to CHF/demand ischemia. No ACE inhibitor or ARB due to renal failure.  Noted to be on BiDil.  Chronic kidney disease stage IV/history of renal  transplant It appears that baseline creatinine is between 3.0 and 3.5.   Nephrology is following.  Appreciate their assistance.  Monitor urine output.  Avoid nephrotoxic agents. Creatinine noted to be slightly higher today compared to yesterday. Patient is on tacrolimus and steroids which is being continued.  Patient also on valganciclovir for history of CMV.  Pneumonia, aspiration versus community-acquired Initially placed on ceftriaxone and azithromycin.  She developed infiltration of the azithromycin so it had to be stopped.  Changed over to oral agents, Omnicef and azithromycin.  Respiratory status is stable.  She is saturating normal on room air.  We will plan for a 5-day course.  Acute metabolic encephalopathy Likely due to her several comorbidities.  No acute findings noted in terms of neurological status.  She did undergo MRI of the brain which did not show any acute infarcts.   Neurological status is stable.  PT and OT evaluation.  Will need to return to SNF when medically improved.  Mentation is back to baseline.    History of paroxysmal atrial fibrillation Beta-blocker on hold due to bradycardia.   Home medication list does not show that she is on anticoagulation.  Noted to be just on aspirin.  Looks like she was on Eliquis previously but was not continued due to concern for GI bleed. Heart rate appears to have improved.  Resume beta-blocker at a lower dose.  Thrombocytopenia Chronic and stable.  No evidence of overt bleeding.  History of SLE with history of lupus nephritis Noted to be on Plaquenil which is being continued.  Hypertension in the setting of chronic kidney disease stage IV  Metoprolol on hold due to  bradycardia.  Monitor blood pressures closely.  History of coronary artery disease Followed at [facility].  Continue aspirin.  Continue statin  Anemia of chronic disease No evidence of overt bleeding currently.  Hemoglobin has been stable.  Recent Pseudomonas  UTI/pyelonephritis involving transplanted kidney Recently completed 7-day course of IV antibiotics with meropenem.  Leukopenia Chronic for the most part.  Continue to monitor for now.  Could be related to her medications.  Recheck labs tomorrow.  Poor venous access/Right arm swelling from IV infiltration Her IV in the right arm infiltrated while she was getting azithromycin.  The arm was swollen.   Apply warm compresses.  Arm seems to be better today.    Obesity Estimated body mass index is 30.49 kg/m as calculated from the following:   Height as of this encounter: $RemoveBeforeD'5\' 7"'ueqsKwtLSgnDdj$  (1.702 m).   Weight as of this encounter: 88.3 kg.    DVT Prophylaxis: SCDs for now Code Status: Full code Family Communication: Discussed with patient.  Will update daughter. Disposition Plan: From SNF  Status is: Inpatient  Remains inpatient appropriate because:IV treatments appropriate due to intensity of illness or inability to take PO and Inpatient level of care appropriate due to severity of illness  Dispo: The patient is from: SNF              Anticipated d/c is to: SNF              Patient currently is not medically stable to d/c.   Difficult to place patient No        Medications: Scheduled:  aspirin EC  81 mg Oral q morning   azithromycin  500 mg Oral Daily   cefdinir  300 mg Oral Q24H   citalopram  10 mg Oral Daily   furosemide  60 mg Intravenous BID   hydroxychloroquine  200 mg Oral BID   isosorbide-hydrALAZINE  1 tablet Oral TID   pantoprazole  40 mg Oral BID   predniSONE  5 mg Oral Daily   rosuvastatin  5 mg Oral q1800   sodium bicarbonate  650 mg Oral TID   sodium chloride flush  3 mL Intravenous Q12H   tacrolimus  0.5 mg Oral BID   valGANciclovir  450 mg Oral QODAY   Continuous:  sodium chloride     GYJ:EHUDJS chloride, acetaminophen **OR** acetaminophen, albuterol, ALPRAZolam, sodium chloride flush  Antibiotics: Anti-infectives (From admission, onward)    Start      Dose/Rate Route Frequency Ordered Stop   11/15/20 1000  valGANciclovir (VALCYTE) 450 MG tablet TABS 450 mg        450 mg Oral Every other day 11/13/20 2328     11/15/20 0800  azithromycin (ZITHROMAX) tablet 500 mg        500 mg Oral Daily 11/14/20 0959     11/14/20 2200  cefdinir (OMNICEF) capsule 300 mg        300 mg Oral Every 24 hours 11/14/20 0959     11/14/20 0015  hydroxychloroquine (PLAQUENIL) tablet 200 mg        200 mg Oral 2 times daily 11/13/20 2328     11/13/20 2300  cefTRIAXone (ROCEPHIN) 2 g in sodium chloride 0.9 % 100 mL IVPB  Status:  Discontinued        2 g 200 mL/hr over 30 Minutes Intravenous Every 24 hours 11/13/20 2230 11/14/20 0959   11/13/20 2300  azithromycin (ZITHROMAX) 500 mg in sodium chloride 0.9 % 250 mL IVPB  Status:  Discontinued        500 mg 250 mL/hr over 60 Minutes Intravenous Every 24 hours 11/13/20 2230 11/14/20 0959       Objective:  Vital Signs  Vitals:   11/15/20 1121 11/15/20 2300 11/16/20 0500 11/16/20 0600  BP: (!) 147/87 120/66  139/80  Pulse: 64 70  70  Resp: $Remo'20 18  20  'RQEbI$ Temp: 97.8 F (36.6 C) 97.6 F (36.4 C)  97.7 F (36.5 C)  TempSrc: Oral Oral  Oral  SpO2: 100% 96%  98%  Weight:   88.3 kg   Height:        Intake/Output Summary (Last 24 hours) at 11/16/2020 1022 Last data filed at 11/16/2020 0700 Gross per 24 hour  Intake 240 ml  Output 250 ml  Net -10 ml    Filed Weights   11/14/20 0313 11/15/20 0425 11/16/20 0500  Weight: 94.5 kg 94.1 kg 88.3 kg    General appearance: Awake alert.  In no distress Resp: Few crackles at the bases bilaterally.  No wheezing or rhonchi. Cardio: S1-S2 is normal regular.  No S3-S4.  No rubs murmurs or bruit GI: Abdomen is soft.  Nontender nondistended.  Bowel sounds are present normal.  No masses organomegaly Extremities: Improved swelling involving the right upper extremity.  Improved range of motion.  Good pulses.  Continues to have lower extremity edema. Neurologic:  No focal  neurological deficits.    Lab Results:  Data Reviewed: I have personally reviewed following labs and imaging studies  CBC: Recent Labs  Lab 11/10/20 0223 11/11/20 0601 11/13/20 1442 11/13/20 1623 11/14/20 0548 11/15/20 0437  WBC 2.3* 2.2* 1.8*  --  1.4* 1.5*  NEUTROABS 1.6* 1.3* 1.0*  --  0.9* 0.8*  HGB 11.0* 10.8* 11.7* 13.9 11.9* 12.0  HCT 33.1* 33.1* 36.2 41.0 37.1 37.8  MCV 89.9 90.4 91.4  --  93.0 91.5  PLT 71* 65* 86*  --  71* 66*     Basic Metabolic Panel: Recent Labs  Lab 11/10/20 0223 11/11/20 0601 11/11/20 1044 11/13/20 1442 11/13/20 1612 11/13/20 1623 11/14/20 0548 11/15/20 0437 11/16/20 0704  NA 134* 133*   < > SPECIMEN HEMOLYZED. HEMOLYSIS MAY AFFECT INTEGRITY OF RESULTS. 140 136 137 134* 141  K 4.4 6.1*   < > SPECIMEN HEMOLYZED. HEMOLYSIS MAY AFFECT INTEGRITY OF RESULTS. 4.2 4.8 4.3 4.5 4.5  CL 103 104   < > SPECIMEN HEMOLYZED. HEMOLYSIS MAY AFFECT INTEGRITY OF RESULTS. 110 110 104 103 107  CO2 18* 17*   < > SPECIMEN HEMOLYZED. HEMOLYSIS MAY AFFECT INTEGRITY OF RESULTS. 19*  --  18* 19* 18*  GLUCOSE 78 82   < > SPECIMEN HEMOLYZED. HEMOLYSIS MAY AFFECT INTEGRITY OF RESULTS. 97 92 79 81 88  BUN 75* 80*   < > SPECIMEN HEMOLYZED. HEMOLYSIS MAY AFFECT INTEGRITY OF RESULTS. 88* 97* 85* 90* 97*  CREATININE 3.40* 3.53*   < > SPECIMEN HEMOLYZED. HEMOLYSIS MAY AFFECT INTEGRITY OF RESULTS. 3.59* 3.80* 3.54* 3.59* 3.95*  CALCIUM 9.3 9.4   < > SPECIMEN HEMOLYZED. HEMOLYSIS MAY AFFECT INTEGRITY OF RESULTS. 10.4*  --  9.8 9.8 10.0  MG 2.4 2.5*  --   --   --   --  2.6* 2.5*  --   PHOS 4.6 4.8*  --   --   --   --  5.6* 5.9*  --    < > = values in this interval not displayed.     GFR: Estimated Creatinine Clearance: 16.2 mL/min (A) (by C-G  formula based on SCr of 3.95 mg/dL (H)).  Liver Function Tests: Recent Labs  Lab 11/10/20 0223 11/11/20 0601 11/13/20 1442 11/13/20 1612 11/14/20 0548 11/15/20 0437  AST 24 46* SPECIMEN HEMOLYZED. HEMOLYSIS MAY AFFECT  INTEGRITY OF RESULTS. 32 27  --   ALT 20 19 SPECIMEN HEMOLYZED. HEMOLYSIS MAY AFFECT INTEGRITY OF RESULTS. 18 16  --   ALKPHOS 40 88 SPECIMEN HEMOLYZED. HEMOLYSIS MAY AFFECT INTEGRITY OF RESULTS. 139* 125  --   BILITOT 1.5* 1.1 SPECIMEN HEMOLYZED. HEMOLYSIS MAY AFFECT INTEGRITY OF RESULTS. 1.5* 1.4*  --   PROT 6.3* 6.0* SPECIMEN HEMOLYZED. HEMOLYSIS MAY AFFECT INTEGRITY OF RESULTS. 7.6 6.9  --   ALBUMIN 2.9* 2.8* SPECIMEN HEMOLYZED. HEMOLYSIS MAY AFFECT INTEGRITY OF RESULTS. 3.5 3.2* 3.3*     Recent Labs  Lab 11/13/20 1612  LIPASE 22      Cardiac Enzymes: Recent Labs  Lab 11/14/20 0548  CKTOTAL 55       Recent Results (from the past 240 hour(s))  SARS CORONAVIRUS 2 (TAT 6-24 HRS) Nasopharyngeal Nasopharyngeal Swab     Status: None   Collection Time: 11/11/20 12:51 PM   Specimen: Nasopharyngeal Swab  Result Value Ref Range Status   SARS Coronavirus 2 NEGATIVE NEGATIVE Final    Comment: (NOTE) SARS-CoV-2 target nucleic acids are NOT DETECTED.  The SARS-CoV-2 RNA is generally detectable in upper and lower respiratory specimens during the acute phase of infection. Negative results do not preclude SARS-CoV-2 infection, do not rule out co-infections with other pathogens, and should not be used as the sole basis for treatment or other patient management decisions. Negative results must be combined with clinical observations, patient history, and epidemiological information. The expected result is Negative.  Fact Sheet for Patients: SugarRoll.be  Fact Sheet for Healthcare Providers: https://www.woods-mathews.com/  This test is not yet approved or cleared by the [place] FDA and  has been authorized for detection and/or diagnosis of SARS-CoV-2 by FDA under an Emergency Use Authorization (EUA). This EUA will remain  in effect (meaning this test can be used) for the duration of the COVID-19 declaration under Se ction 564(b)(1)  of the Act, 21 U.S.C. section 360bbb-3(b)(1), unless the authorization is terminated or revoked sooner.  Performed at [facility], [place] [address] [place], [place] [address]   Resp Panel by RT-PCR (Flu A&B, Covid) Nasopharyngeal Swab     Status: None   Collection Time: 11/13/20  5:49 PM   Specimen: Nasopharyngeal Swab; Nasopharyngeal(NP) swabs in vial transport medium  Result Value Ref Range Status   SARS Coronavirus 2 by RT PCR NEGATIVE NEGATIVE Final    Comment: (NOTE) SARS-CoV-2 target nucleic acids are NOT DETECTED.  The SARS-CoV-2 RNA is generally detectable in upper respiratory specimens during the acute phase of infection. The lowest concentration of SARS-CoV-2 viral copies this assay can detect is 138 copies/mL. A negative result does not preclude SARS-Cov-2 infection and should not be used as the sole basis for treatment or other patient management decisions. A negative result may occur with  improper specimen collection/handling, submission of specimen other than nasopharyngeal swab, presence of viral mutation(s) within the areas targeted by this assay, and inadequate number of viral copies(<138 copies/mL). A negative result must be combined with clinical observations, patient history, and epidemiological information. The expected result is Negative.  Fact Sheet for Patients:  EntrepreneurPulse.com.au  Fact Sheet for Healthcare Providers:  IncredibleEmployment.be  This test is no t yet approved or cleared by the [place] FDA and  has  been authorized for detection and/or diagnosis of SARS-CoV-2 by FDA under an Emergency Use Authorization (EUA). This EUA will remain  in effect (meaning this test can be used) for the duration of the COVID-19 declaration under Section 564(b)(1) of the Act, 21 U.S.C.section 360bbb-3(b)(1), unless the authorization is terminated  or revoked sooner.       Influenza A by PCR NEGATIVE  NEGATIVE Final   Influenza B by PCR NEGATIVE NEGATIVE Final    Comment: (NOTE) The Xpert Xpress SARS-CoV-2/FLU/RSV plus assay is intended as an aid in the diagnosis of influenza from Nasopharyngeal swab specimens and should not be used as a sole basis for treatment. Nasal washings and aspirates are unacceptable for Xpert Xpress SARS-CoV-2/FLU/RSV testing.  Fact Sheet for Patients: EntrepreneurPulse.com.au  Fact Sheet for Healthcare Providers: IncredibleEmployment.be  This test is not yet approved or cleared by the [place] FDA and has been authorized for detection and/or diagnosis of SARS-CoV-2 by FDA under an Emergency Use Authorization (EUA). This EUA will remain in effect (meaning this test can be used) for the duration of the COVID-19 declaration under Section 564(b)(1) of the Act, 21 U.S.C. section 360bbb-3(b)(1), unless the authorization is terminated or revoked.  Performed at [facility], [place] [address] [place], [place] [address]   Culture, blood (Routine X 2) w Reflex to ID Panel     Status: None (Preliminary result)   Collection Time: 11/14/20  7:11 AM   Specimen: BLOOD RIGHT HAND  Result Value Ref Range Status   Specimen Description   Final    BLOOD RIGHT HAND Performed at [place] [address] [place], [place] [address]    Special Requests   Final    BOTTLES DRAWN AEROBIC ONLY Blood Culture results may not be optimal due to an inadequate volume of blood received in culture bottles Performed at [facility] [address], [place], [place] [address]    Culture   Final    NO GROWTH 2 DAYS Performed at [place] [address] [place], [place] [address]    Report Status PENDING  Incomplete  MRSA Next Gen by PCR, Nasal     Status: None   Collection Time: 11/14/20  6:00 PM   Specimen: Nasal Mucosa; Nasal Swab  Result Value Ref Range Status   MRSA  by PCR Next Gen NOT DETECTED NOT DETECTED Final    Comment: RESULT CALLED TO, READ BACK BY AND VERIFIED WITH: SCOTPON J $[id number]$  BY BATTLET Performed at [facility] [address] [place], [place] [address]        Radiology Studies: No results found.     LOS: 2 days   [facility] Pager on [email or web address]  11/16/2020, 10:22 AM

## 2020-11-17 ENCOUNTER — Inpatient Hospital Stay (HOSPITAL_COMMUNITY): Payer: Medicare Other

## 2020-11-17 DIAGNOSIS — D638 Anemia in other chronic diseases classified elsewhere: Secondary | ICD-10-CM | POA: Diagnosis not present

## 2020-11-17 DIAGNOSIS — I5023 Acute on chronic systolic (congestive) heart failure: Secondary | ICD-10-CM | POA: Diagnosis not present

## 2020-11-17 DIAGNOSIS — I480 Paroxysmal atrial fibrillation: Secondary | ICD-10-CM | POA: Diagnosis not present

## 2020-11-17 DIAGNOSIS — Z515 Encounter for palliative care: Secondary | ICD-10-CM | POA: Diagnosis not present

## 2020-11-17 DIAGNOSIS — Z7189 Other specified counseling: Secondary | ICD-10-CM | POA: Diagnosis not present

## 2020-11-17 DIAGNOSIS — N185 Chronic kidney disease, stage 5: Secondary | ICD-10-CM | POA: Diagnosis not present

## 2020-11-17 LAB — CBC WITH DIFFERENTIAL/PLATELET
Abs Immature Granulocytes: 0.2 10*3/uL — ABNORMAL HIGH (ref 0.00–0.07)
Basophils Absolute: 0 10*3/uL (ref 0.0–0.1)
Basophils Relative: 1 %
Eosinophils Absolute: 0 10*3/uL (ref 0.0–0.5)
Eosinophils Relative: 1 %
HCT: 32.4 % — ABNORMAL LOW (ref 36.0–46.0)
Hemoglobin: 10.6 g/dL — ABNORMAL LOW (ref 12.0–15.0)
Immature Granulocytes: 15 %
Lymphocytes Relative: 18 %
Lymphs Abs: 0.3 10*3/uL — ABNORMAL LOW (ref 0.7–4.0)
MCH: 29.1 pg (ref 26.0–34.0)
MCHC: 32.7 g/dL (ref 30.0–36.0)
MCV: 89 fL (ref 80.0–100.0)
Monocytes Absolute: 0.3 10*3/uL (ref 0.1–1.0)
Monocytes Relative: 24 %
Neutro Abs: 0.6 10*3/uL — ABNORMAL LOW (ref 1.7–7.7)
Neutrophils Relative %: 41 %
Platelets: 69 10*3/uL — ABNORMAL LOW (ref 150–400)
RBC: 3.64 MIL/uL — ABNORMAL LOW (ref 3.87–5.11)
RDW: 21.5 % — ABNORMAL HIGH (ref 11.5–15.5)
WBC: 1.4 10*3/uL — CL (ref 4.0–10.5)
nRBC: 2.9 % — ABNORMAL HIGH (ref 0.0–0.2)

## 2020-11-17 LAB — COMPREHENSIVE METABOLIC PANEL
ALT: 12 U/L (ref 0–44)
AST: 28 U/L (ref 15–41)
Albumin: 3 g/dL — ABNORMAL LOW (ref 3.5–5.0)
Alkaline Phosphatase: 96 U/L (ref 38–126)
Anion gap: 11 (ref 5–15)
BUN: 99 mg/dL — ABNORMAL HIGH (ref 8–23)
CO2: 20 mmol/L — ABNORMAL LOW (ref 22–32)
Calcium: 9.2 mg/dL (ref 8.9–10.3)
Chloride: 105 mmol/L (ref 98–111)
Creatinine, Ser: 4 mg/dL — ABNORMAL HIGH (ref 0.44–1.00)
GFR, Estimated: 12 mL/min — ABNORMAL LOW (ref >60)
Glucose, Bld: 100 mg/dL — ABNORMAL HIGH (ref 70–99)
Potassium: 4.4 mmol/L (ref 3.5–5.1)
Sodium: 136 mmol/L (ref 135–145)
Total Bilirubin: 1.3 mg/dL — ABNORMAL HIGH (ref 0.3–1.2)
Total Protein: 6.3 g/dL — ABNORMAL LOW (ref 6.5–8.1)

## 2020-11-17 LAB — RENAL FUNCTION PANEL
Albumin: 3.1 g/dL — ABNORMAL LOW (ref 3.5–5.0)
Anion gap: 10 (ref 5–15)
BUN: 97 mg/dL — ABNORMAL HIGH (ref 8–23)
CO2: 19 mmol/L — ABNORMAL LOW (ref 22–32)
Calcium: 9 mg/dL (ref 8.9–10.3)
Chloride: 105 mmol/L (ref 98–111)
Creatinine, Ser: 3.85 mg/dL — ABNORMAL HIGH (ref 0.44–1.00)
GFR, Estimated: 12 mL/min — ABNORMAL LOW (ref >60)
Glucose, Bld: 105 mg/dL — ABNORMAL HIGH (ref 70–99)
Phosphorus: 5.1 mg/dL — ABNORMAL HIGH (ref 2.5–4.6)
Potassium: 4.4 mmol/L (ref 3.5–5.1)
Sodium: 134 mmol/L — ABNORMAL LOW (ref 135–145)

## 2020-11-17 LAB — LIPASE, BLOOD: Lipase: 43 U/L (ref 11–51)

## 2020-11-17 MED ORDER — HYDROMORPHONE HCL 1 MG/ML IJ SOLN
0.5000 mg | INTRAMUSCULAR | Status: DC | PRN
  Administered 2020-11-17 – 2020-11-19 (×2): 0.5 mg via INTRAVENOUS
  Filled 2020-11-17 (×2): qty 0.5

## 2020-11-17 MED ORDER — CALCIUM CARBONATE ANTACID 500 MG PO CHEW
1.0000 | CHEWABLE_TABLET | Freq: Two times a day (BID) | ORAL | Status: DC | PRN
  Administered 2020-11-17 – 2020-11-19 (×2): 200 mg via ORAL
  Filled 2020-11-17 (×2): qty 1

## 2020-11-17 MED ORDER — SIMETHICONE 40 MG/0.6ML PO SUSP
40.0000 mg | Freq: Four times a day (QID) | ORAL | Status: DC | PRN
  Filled 2020-11-17: qty 0.6

## 2020-11-17 MED ORDER — OXYCODONE HCL 5 MG PO TABS
5.0000 mg | ORAL_TABLET | Freq: Four times a day (QID) | ORAL | Status: DC | PRN
  Administered 2020-11-17 – 2020-11-23 (×5): 5 mg via ORAL
  Filled 2020-11-17 (×5): qty 1

## 2020-11-17 MED ORDER — ALUM & MAG HYDROXIDE-SIMETH 200-200-20 MG/5ML PO SUSP
30.0000 mL | Freq: Once | ORAL | Status: AC
  Administered 2020-11-17: 30 mL via ORAL
  Filled 2020-11-17: qty 30

## 2020-11-17 MED ORDER — METOPROLOL TARTRATE 25 MG PO TABS
12.5000 mg | ORAL_TABLET | Freq: Two times a day (BID) | ORAL | Status: DC
  Administered 2020-11-17 – 2020-11-18 (×3): 12.5 mg via ORAL
  Filled 2020-11-17 (×3): qty 1

## 2020-11-17 MED ORDER — ONDANSETRON HCL 4 MG/2ML IJ SOLN
4.0000 mg | Freq: Three times a day (TID) | INTRAMUSCULAR | Status: DC | PRN
  Administered 2020-11-17: 4 mg via INTRAVENOUS
  Filled 2020-11-17: qty 2

## 2020-11-17 NOTE — Progress Notes (Signed)
Palliative:  HPI: 65 y.o. female  with past medical history of CHF EF 20-25%, CAD, paroxysmal atrial fibrillation, h/o LA thrombus, lupus nephritis, focal segmental glomerularsclerosis, s/p renal transplant on immunosuppressants, stroke admitted on 11/13/2020 with bilateral leg pain and swelling, abd pain, nausea, chest pain related to CHF exacerbation, acute on chronic renal failure, pneumonia and with mencephalopathy. Admitted recently with acute kidney injury, pyelonephritis, Pseudomonas UTI, melanotic stools, acute systolic CHF, large right-sided pleural effusion and d/c SNF 9/28.    I met today with Jenna [name] and her daughter, [name]. Ms. Kaufmann is awake and alert but pretty miserable. She complains of epigastric pain and difficult to know if this is painful to touch. She denies any chest pain and vital signs are stable. She is having nausea and dry heaving. I am worried she is having some heartburn or even gas pains and have added prn medication to her regimen. She seemed to have some relief with Zofran and no longer dry heaving. She is also having some shallow tachypnea and discussed with Dr. [place] Pink who will order [facility].   I spoke more with [name] and plans to discuss further with Ms. Mcalpine's family today regarding the severity of her condition so they can all support [name] and [name] through this illness. We will meet tomorrow 4pm together. I feel some of Ms. Caulder's discomfort today is also from some anxiety as she was educated about the severity of her condition and poor prognosis. She is very much struggling with this. She has been critically ill many times in the past but has always has opportunity for recovery but this time is different as treatment options are limited and her disease processes are progressive.   All questions/concerns addressed. Emotional support provided.   Exam: Alert, oriented. Restless and cannot get comfortable. Breathing shallow, tachypneic. Abd soft,  epigastric tenderness? Moves all extremities. BLE edema.   Plan: - Zofran, tums, simethicone ordered prn for nausea, heartburn, gas.  - Meeting tomorrow 4pm with family to educate about diagnosis and expectations.   [place], NP Palliative Medicine Team Pager [phone] (Please see [email or web address] for schedule) Team Phone [phone]    Greater than 50%  of this time was spent counseling and coordinating care related to the above assessment and plan

## 2020-11-17 NOTE — Progress Notes (Signed)
Patient ID: Jenna [name], female   DOB: 1955/03/25, 65 y.o.   MRN: [id number] [facility] Progress Note   Assessment/ Plan:   Chronic kidney disease stage IV T: Renal function stable today, sp IV lasix x 48hrs and now on torsemide 20 bid.  Continue with tacrolimus and prednisone for kidney transplant immunosuppression and awaiting tacrolimus trough level after recent decrease in dose last week Pneumonia: Suspected aspiration versus community-acquired, on azithromycin and ceftriaxone at this time. Acute exacerbation of systolic heart failure: Unclear accuracy of urine output or weight. Has diffuse non-pitting edema legs, CXR w/o edema but large R effusion/infiltrate and CM.  HTN - BP's dropping into 110's now, getting Bidil tid and 12.$RemoveBe'5mg'LcIWmGiHn$  metoprolol daily.  History of cytomegalovirus viremia: Continue valganciclovir with renal adjustment Anemia of chronic disease: Without overt blood loss and stable hemoglobin and hematocrit overnight.  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   Kelly Splinter, MD 11/17/2020, 3:43 PM     Subjective:   No c/o today, onnasal O2 at 2 L /min   Objective:   BP 117/70 (BP Location: Right Wrist)   Pulse 77   Temp 98.6 F (37 C) (Oral)   Resp 15   Ht $R'5\' 7"'KN$  (1.702 m)   Wt 94.4 kg   SpO2 100%   BMI 32.60 kg/m   Intake/Output Summary (Last 24 hours) at 11/17/2020 1540 Last data filed at 11/16/2020 2315 Gross per 24 hour  Intake 120 ml  Output 100 ml  Net 20 ml    Weight change: 6.1 kg  Physical Exam: Gen: Appears chronically ill, uncomfortable resting up in recliner CVS: Pulse regular rhythm,  normal rate, S1 and S2 with ejection systolic murmur Resp: Poor inspiratory effort with decreased breath sounds over bases Abd: Soft, obese, nontender, bowel sounds normal Ext: 1-2+ bilateral lower extremity edema, left BCF.  Decreasing right upper extremity edema swelling.  Imaging: DG CHEST PORT 1 VIEW  Result Date: 11/17/2020 CLINICAL DATA:  Dyspnea EXAM: PORTABLE CHEST 1 VIEW COMPARISON:  11/13/2020 FINDINGS: Stable cardiomegaly with mild pulmonary vascular congestion. Small bilateral pleural effusions, right slightly greater than left. Dense airspace opacity in the right lower lobe, slightly worsened from prior. Patchy left basilar opacity with slightly improved aeration in the left lung base compared to prior. No pneumothorax. IMPRESSION: 1. Dense airspace opacity in the right lower lobe, slightly worsened from prior. 2. Patchy left basilar opacity with slightly improved aeration in the left lung base compared to prior. 3. Cardiomegaly with pulmonary vascular congestion and small bilateral pleural effusions. Electronically Signed   By: Davina Poke D.O.   On: 11/17/2020 15:05    Labs: BMET Recent Labs  Lab 11/11/20 0601 11/11/20 1044 11/13/20 1442 11/13/20 1612 11/13/20 1623 11/14/20 0548 11/15/20 0437 11/16/20 0704 11/17/20 0339  NA 133* 133* SPECIMEN HEMOLYZED. HEMOLYSIS MAY AFFECT INTEGRITY OF RESULTS. 140 136 137 134* 141 134*  K 6.1* 4.7 SPECIMEN HEMOLYZED. HEMOLYSIS MAY AFFECT INTEGRITY OF RESULTS. 4.2 4.8 4.3 4.5 4.5 4.4  CL 104 104 SPECIMEN HEMOLYZED. HEMOLYSIS MAY AFFECT INTEGRITY OF RESULTS. 110 110 104 103 107 105  CO2 17* 15* SPECIMEN HEMOLYZED. HEMOLYSIS MAY AFFECT INTEGRITY OF RESULTS. 19*  --  18* 19* 18* 19*  GLUCOSE 82 75 SPECIMEN HEMOLYZED. HEMOLYSIS  MAY AFFECT INTEGRITY OF RESULTS. 97 92 79 81 88 105*  BUN 80* 79* SPECIMEN HEMOLYZED. HEMOLYSIS MAY AFFECT INTEGRITY OF RESULTS. 88* 97* 85* 90* 97* 97*  CREATININE 3.53* 3.47* SPECIMEN HEMOLYZED. HEMOLYSIS MAY  AFFECT INTEGRITY OF RESULTS. 3.59* 3.80* 3.54* 3.59* 3.95* 3.85*  CALCIUM 9.4 9.7 SPECIMEN HEMOLYZED. HEMOLYSIS MAY AFFECT INTEGRITY OF RESULTS. 10.4*  --  9.8 9.8 10.0 9.0  PHOS 4.8*  --   --   --   --  5.6* 5.9*  --  5.1*    CBC Recent Labs  Lab 11/13/20 1442 11/13/20 1623 11/14/20 0548 11/15/20 0437 11/17/20 0339  WBC 1.8*  --  1.4* 1.5* 1.4*  NEUTROABS 1.0*  --  0.9* 0.8* 0.6*  HGB 11.7* 13.9 11.9* 12.0 10.6*  HCT 36.2 41.0 37.1 37.8 32.4*  MCV 91.4  --  93.0 91.5 89.0  PLT 86*  --  71* 66* 69*     Medications:     alum & mag hydroxide-simeth  30 mL Oral Once   aspirin EC  81 mg Oral q morning   azithromycin  500 mg Oral Daily   cefdinir  300 mg Oral Q24H   citalopram  10 mg Oral Daily   feeding supplement (NEPRO CARB STEADY)  237 mL Oral TID BM   hydroxychloroquine  200 mg Oral BID   isosorbide-hydrALAZINE  1 tablet Oral TID   metoprolol tartrate  12.5 mg Oral BID   pantoprazole  40 mg Oral BID   predniSONE  5 mg Oral Daily   rosuvastatin  5 mg Oral q1800   sodium bicarbonate  650 mg Oral TID   sodium chloride flush  3 mL Intravenous Q12H   tacrolimus  0.5 mg Oral BID   torsemide  20 mg Oral BID   valGANciclovir  450 mg Oral QODAY

## 2020-11-17 NOTE — Plan of Care (Signed)
  Problem: Clinical Measurements: Goal: Will remain free from infection Outcome: Progressing Goal: Cardiovascular complication will be avoided Outcome: Progressing   Problem: Pain Managment: Goal: General experience of comfort will improve Outcome: Progressing

## 2020-11-17 NOTE — Progress Notes (Signed)
RN made Dr. Maryland Pink aware that patient complains of severe abdominal pain that is twisting feeling per patient and unable to get comfortable in bed. No emesis. MD acknowledged and stated that he ordered labs, pain medication and abdominal xray. RN went in and spoke with patient and her daughter and made them both aware of doctors orders and Maylon Cos, RN at bedside and aware of plan of care. Maylon Cos, RN taking over patient's care for tonight.

## 2020-11-17 NOTE — Progress Notes (Signed)
Allergies  [facility] PROGRESS NOTE   SHANIN SZYMANOWSKI BDZ:[id number] DOB: [birth year] DOA: 11/13/2020  PCP: [name], Betty G, MD  Brief History/Interval Summary: SHALAYNE LEACH is a 65 y.o. female with medical history significant of anemia, CAD, depression, chronic kidney disease stage IV, status post renal transplant on immunosuppressants, hypertension, focal segmental glomerularsclerosis, systolic CHF EF 34-19, lupus nephritis, ischemic cardiomyopathy, history of LA thrombus, obesity, paroxysmal atrial fibrillation, CVA of parietal lobe, known CAD status post DES to LAD in September 2020.  She was admitted earlier this month for acute kidney injury, pyelonephritis, Pseudomonas UTI, melanotic stools and acute systolic CHF.  Also was noted to have large right-sided pleural effusion.  She was stabilized and then discharged to a skilled nursing facility on 9/28.  Patient presented with complaints of bilateral leg pain and swelling, abdominal pain nausea chest pain.  She felt that she had not really improved.   Consultants: Cardiology.  Nephrology  Procedures: None yet    Subjective/Interval History: Overall patient feels better compared to how she was a few days ago.  Right arm swelling and pain has improved.  Denies any shortness of breath.  No chest pain.    Assessment/Plan:  Acute on chronic systolic CHF EF is known to be about 20 to 25%.  Patient is followed by cardiology at [facility].  She was noted to have bilateral lower extremity edema and JVD.   She was started on IV diuretics.  Cardiology was consulted.  Will defer diuretics to nephrology. Elevated troponin levels likely due to CHF and demand ischemia.  No further work-up was recommended. No ACE inhibitor or ARB due to renal failure. Noted to be on BiDil.  Continue strict ins and outs and daily weights.    Chronic kidney disease stage IV/history of renal transplant It appears that baseline  creatinine is between 3.0 and 3.5. Rise in creatinine was noted likely due to aggressive diuretics.  Seems to be stable now.  Avoid nephrotoxic agents.  Urine output not charted accurately. Patient is on tacrolimus and steroids which is being continued.  Patient also on valganciclovir for history of CMV.  Pneumonia, aspiration versus community-acquired Initially placed on ceftriaxone and azithromycin.  She developed infiltration of the azithromycin so it had to be stopped.  Changed over to oral agents, Omnicef and azithromycin.  Respiratory status is stable.  She is saturating normal on room air.  We will plan for a 5-day course.  Acute metabolic encephalopathy Likely due to her several comorbidities.  No acute findings noted in terms of neurological status.  She did undergo MRI of the brain which did not show any acute infarcts.   Neurological status is stable.  PT and OT evaluation.  Will need to return to SNF when medically improved.  Mentation is back to baseline.    History of paroxysmal atrial fibrillation Beta-blocker on hold due to bradycardia.   Home medication list does not show that she is on anticoagulation.  Noted to be just on aspirin.  Looks like she was on Eliquis previously but was not continued due to concern for GI bleed. Heart rate appears to have improved.  Resume beta-blocker at a lower dose.  Thrombocytopenia Chronic and stable.  No evidence of overt bleeding.  History of SLE with history of lupus nephritis Noted to be on Plaquenil which is being continued.  Hypertension in the setting of chronic kidney disease stage IV  Blood pressure is reasonably well controlled.  History of  coronary artery disease Followed at [facility]  Continue aspirin.  Continue statin  Anemia of chronic disease No evidence of overt bleeding currently.  Hemoglobin has been stable.  Recent Pseudomonas UTI/pyelonephritis involving transplanted kidney Recently completed 7-day course of  IV antibiotics with meropenem.  Leukopenia Chronic for the most part.  Continue to monitor for now.  Could be related to her medications.    Poor venous access/Right arm swelling from IV infiltration Her IV in the right arm infiltrated while she was getting azithromycin.  Right arm swelling improving.  Continue to keep it elevated.  Obesity Estimated body mass index is 32.6 kg/m as calculated from the following:   Height as of this encounter: $RemoveBeforeD'5\' 7"'XebTBxGBlIbPvJ$  (1.702 m).   Weight as of this encounter: 94.4 kg.    DVT Prophylaxis: SCDs for now Code Status: Full code Family Communication: Daughter being updated daily. Disposition Plan: From SNF  Status is: Inpatient  Remains inpatient appropriate because:IV treatments appropriate due to intensity of illness or inability to take PO and Inpatient level of care appropriate due to severity of illness  Dispo: The patient is from: SNF              Anticipated d/c is to: SNF              Patient currently is not medically stable to d/c.   Difficult to place patient No        Medications: Scheduled:  aspirin EC  81 mg Oral q morning   azithromycin  500 mg Oral Daily   cefdinir  300 mg Oral Q24H   citalopram  10 mg Oral Daily   feeding supplement (NEPRO CARB STEADY)  237 mL Oral TID BM   hydroxychloroquine  200 mg Oral BID   isosorbide-hydrALAZINE  1 tablet Oral TID   pantoprazole  40 mg Oral BID   predniSONE  5 mg Oral Daily   rosuvastatin  5 mg Oral q1800   sodium bicarbonate  650 mg Oral TID   sodium chloride flush  3 mL Intravenous Q12H   tacrolimus  0.5 mg Oral BID   torsemide  20 mg Oral BID   valGANciclovir  450 mg Oral QODAY   Continuous:  sodium chloride     GMW:NUUVOZ chloride, acetaminophen **OR** acetaminophen, albuterol, ALPRAZolam, sodium chloride flush  Antibiotics: Anti-infectives (From admission, onward)    Start     Dose/Rate Route Frequency Ordered Stop   11/15/20 1000  valGANciclovir (VALCYTE) 450 MG tablet  TABS 450 mg        450 mg Oral Every other day 11/13/20 2328     11/15/20 0800  azithromycin (ZITHROMAX) tablet 500 mg        500 mg Oral Daily 11/14/20 0959 11/20/20 0959   11/14/20 2200  cefdinir (OMNICEF) capsule 300 mg        300 mg Oral Every 24 hours 11/14/20 0959 11/19/20 2159   11/14/20 0015  hydroxychloroquine (PLAQUENIL) tablet 200 mg        200 mg Oral 2 times daily 11/13/20 2328     11/13/20 2300  cefTRIAXone (ROCEPHIN) 2 g in sodium chloride 0.9 % 100 mL IVPB  Status:  Discontinued        2 g 200 mL/hr over 30 Minutes Intravenous Every 24 hours 11/13/20 2230 11/14/20 0959   11/13/20 2300  azithromycin (ZITHROMAX) 500 mg in sodium chloride 0.9 % 250 mL IVPB  Status:  Discontinued        500  mg 250 mL/hr over 60 Minutes Intravenous Every 24 hours 11/13/20 2230 11/14/20 0959       Objective:  Vital Signs  Vitals:   11/16/20 1951 11/17/20 0500 11/17/20 0515 11/17/20 0900  BP: 124/72  117/78   Pulse: 72  79   Resp: $Remo'14  18 14  'tTzPI$ Temp: 98.4 F (36.9 C)  (!) 97.4 F (36.3 C)   TempSrc: Oral     SpO2: 95%  100%   Weight:  94.4 kg    Height:        Intake/Output Summary (Last 24 hours) at 11/17/2020 1018 Last data filed at 11/16/2020 2315 Gross per 24 hour  Intake 120 ml  Output 200 ml  Net -80 ml    Filed Weights   11/15/20 0425 11/16/20 0500 11/17/20 0500  Weight: 94.1 kg 88.3 kg 94.4 kg    General appearance: Awake alert.  In no distress Resp: Diminished air entry at the bases.  Few crackles.  No wheezing or rhonchi. Cardio: S1-S2 is normal regular.  No S3-S4.  No rubs murmurs or bruit GI: Abdomen is soft.  Nontender nondistended.  Bowel sounds are present normal.  No masses organomegaly Extremities: Improved right arm swelling.  Improving lower extremity edema  Neurologic:  No focal neurological deficits.      Lab Results:  Data Reviewed: I have personally reviewed following labs and imaging studies  CBC: Recent Labs  Lab 11/11/20 0601  11/13/20 1442 11/13/20 1623 11/14/20 0548 11/15/20 0437 11/17/20 0339  WBC 2.2* 1.8*  --  1.4* 1.5* 1.4*  NEUTROABS 1.3* 1.0*  --  0.9* 0.8* 0.6*  HGB 10.8* 11.7* 13.9 11.9* 12.0 10.6*  HCT 33.1* 36.2 41.0 37.1 37.8 32.4*  MCV 90.4 91.4  --  93.0 91.5 89.0  PLT 65* 86*  --  71* 66* 69*     Basic Metabolic Panel: Recent Labs  Lab 11/11/20 0601 11/11/20 1044 11/13/20 1612 11/13/20 1623 11/14/20 0548 11/15/20 0437 11/16/20 0704 11/17/20 0339  NA 133*   < > 140 136 137 134* 141 134*  K 6.1*   < > 4.2 4.8 4.3 4.5 4.5 4.4  CL 104   < > 110 110 104 103 107 105  CO2 17*   < > 19*  --  18* 19* 18* 19*  GLUCOSE 82   < > 97 92 79 81 88 105*  BUN 80*   < > 88* 97* 85* 90* 97* 97*  CREATININE 3.53*   < > 3.59* 3.80* 3.54* 3.59* 3.95* 3.85*  CALCIUM 9.4   < > 10.4*  --  9.8 9.8 10.0 9.0  MG 2.5*  --   --   --  2.6* 2.5*  --   --   PHOS 4.8*  --   --   --  5.6* 5.9*  --  5.1*   < > = values in this interval not displayed.     GFR: Estimated Creatinine Clearance: 17.2 mL/min (A) (by C-G formula based on SCr of 3.85 mg/dL (H)).  Liver Function Tests: Recent Labs  Lab 11/11/20 0601 11/13/20 1442 11/13/20 1612 11/14/20 0548 11/15/20 0437 11/17/20 0339  AST 46* SPECIMEN HEMOLYZED. HEMOLYSIS MAY AFFECT INTEGRITY OF RESULTS. 32 27  --   --   ALT 19 SPECIMEN HEMOLYZED. HEMOLYSIS MAY AFFECT INTEGRITY OF RESULTS. 18 16  --   --   ALKPHOS 24 SPECIMEN HEMOLYZED. HEMOLYSIS MAY AFFECT INTEGRITY OF RESULTS. 139* 125  --   --   BILITOT 1.1 SPECIMEN  HEMOLYZED. HEMOLYSIS MAY AFFECT INTEGRITY OF RESULTS. 1.5* 1.4*  --   --   PROT 6.0* SPECIMEN HEMOLYZED. HEMOLYSIS MAY AFFECT INTEGRITY OF RESULTS. 7.6 6.9  --   --   ALBUMIN 2.8* SPECIMEN HEMOLYZED. HEMOLYSIS MAY AFFECT INTEGRITY OF RESULTS. 3.5 3.2* 3.3* 3.1*     Recent Labs  Lab 11/13/20 1612  LIPASE 22      Cardiac Enzymes: Recent Labs  Lab 11/14/20 0548  CKTOTAL 55       Recent Results (from the past 240 hour(s))  SARS  CORONAVIRUS 2 (TAT 6-24 HRS) Nasopharyngeal Nasopharyngeal Swab     Status: None   Collection Time: 11/11/20 12:51 PM   Specimen: Nasopharyngeal Swab  Result Value Ref Range Status   SARS Coronavirus 2 NEGATIVE NEGATIVE Final    Comment: (NOTE) SARS-CoV-2 target nucleic acids are NOT DETECTED.  The SARS-CoV-2 RNA is generally detectable in upper and lower respiratory specimens during the acute phase of infection. Negative results do not preclude SARS-CoV-2 infection, do not rule out co-infections with other pathogens, and should not be used as the sole basis for treatment or other patient management decisions. Negative results must be combined with clinical observations, patient history, and epidemiological information. The expected result is Negative.  Fact Sheet for Patients: SugarRoll.be  Fact Sheet for Healthcare Providers: https://www.woods-mathews.com/  This test is not yet approved or cleared by the [place] FDA and  has been authorized for detection and/or diagnosis of SARS-CoV-2 by FDA under an Emergency Use Authorization (EUA). This EUA will remain  in effect (meaning this test can be used) for the duration of the COVID-19 declaration under Se ction 564(b)(1) of the Act, 21 U.S.C. section 360bbb-3(b)(1), unless the authorization is terminated or revoked sooner.  Performed at [facility], [place] [address], [place], [place] [address]   Resp Panel by RT-PCR (Flu A&B, Covid) Nasopharyngeal Swab     Status: None   Collection Time: 11/13/20  5:49 PM   Specimen: Nasopharyngeal Swab; Nasopharyngeal(NP) swabs in vial transport medium  Result Value Ref Range Status   SARS Coronavirus 2 by RT PCR NEGATIVE NEGATIVE Final    Comment: (NOTE) SARS-CoV-2 target nucleic acids are NOT DETECTED.  The SARS-CoV-2 RNA is generally detectable in upper respiratory specimens during the acute phase of infection. The lowest concentration of  SARS-CoV-2 viral copies this assay can detect is 138 copies/mL. A negative result does not preclude SARS-Cov-2 infection and should not be used as the sole basis for treatment or other patient management decisions. A negative result may occur with  improper specimen collection/handling, submission of specimen other than nasopharyngeal swab, presence of viral mutation(s) within the areas targeted by this assay, and inadequate number of viral copies(<138 copies/mL). A negative result must be combined with clinical observations, patient history, and epidemiological information. The expected result is Negative.  Fact Sheet for Patients:  EntrepreneurPulse.com.au  Fact Sheet for Healthcare Providers:  IncredibleEmployment.be  This test is no t yet approved or cleared by the [place] FDA and  has been authorized for detection and/or diagnosis of SARS-CoV-2 by FDA under an Emergency Use Authorization (EUA). This EUA will remain  in effect (meaning this test can be used) for the duration of the COVID-19 declaration under Section 564(b)(1) of the Act, 21 U.S.C.section 360bbb-3(b)(1), unless the authorization is terminated  or revoked sooner.       Influenza A by PCR NEGATIVE NEGATIVE Final   Influenza B by PCR NEGATIVE NEGATIVE Final  Comment: (NOTE) The Xpert Xpress SARS-CoV-2/FLU/RSV plus assay is intended as an aid in the diagnosis of influenza from Nasopharyngeal swab specimens and should not be used as a sole basis for treatment. Nasal washings and aspirates are unacceptable for Xpert Xpress SARS-CoV-2/FLU/RSV testing.  Fact Sheet for Patients: EntrepreneurPulse.com.au  Fact Sheet for Healthcare Providers: IncredibleEmployment.be  This test is not yet approved or cleared by the [place] FDA and has been authorized for detection and/or diagnosis of SARS-CoV-2 by FDA under an Emergency Use  Authorization (EUA). This EUA will remain in effect (meaning this test can be used) for the duration of the COVID-19 declaration under Section 564(b)(1) of the Act, 21 U.S.C. section 360bbb-3(b)(1), unless the authorization is terminated or revoked.  Performed at [facility], [place] [address], [place], [place] [address]   Culture, blood (Routine X 2) w Reflex to ID Panel     Status: None (Preliminary result)   Collection Time: 11/14/20  7:11 AM   Specimen: BLOOD RIGHT HAND  Result Value Ref Range Status   Specimen Description   Final    BLOOD RIGHT HAND Performed at [facility] [address] [place], [place] [address]    Special Requests   Final    BOTTLES DRAWN AEROBIC ONLY Blood Culture results may not be optimal due to an inadequate volume of blood received in culture bottles Performed at [facility] [address] [place], [place] [address]    Culture   Final    NO GROWTH 3 DAYS Performed at [facility], [place] [address], [place], [place] [address]    Report Status PENDING  Incomplete  MRSA Next Gen by PCR, Nasal     Status: None   Collection Time: 11/14/20  6:00 PM   Specimen: Nasal Mucosa; Nasal Swab  Result Value Ref Range Status   MRSA by PCR Next Gen NOT DETECTED NOT DETECTED Final    Comment: RESULT CALLED TO, READ BACK BY AND VERIFIED WITH: SCOTPON J $RemoveB'@0535'SnIMVfJx$  BY BATTLET Performed at [facility] [address], [place], [place] [address]        Radiology Studies: No results found.     LOS: 3 days   Eleaner Dibartolo [employer] on [email or web address]  11/17/2020, 10:18 AM

## 2020-11-18 ENCOUNTER — Inpatient Hospital Stay (HOSPITAL_COMMUNITY): Payer: Medicare Other

## 2020-11-18 DIAGNOSIS — D72819 Decreased white blood cell count, unspecified: Secondary | ICD-10-CM

## 2020-11-18 LAB — RENAL FUNCTION PANEL
Albumin: 3 g/dL — ABNORMAL LOW (ref 3.5–5.0)
Anion gap: 10 (ref 5–15)
BUN: 96 mg/dL — ABNORMAL HIGH (ref 8–23)
CO2: 20 mmol/L — ABNORMAL LOW (ref 22–32)
Calcium: 9.3 mg/dL (ref 8.9–10.3)
Chloride: 108 mmol/L (ref 98–111)
Creatinine, Ser: 4.48 mg/dL — ABNORMAL HIGH (ref 0.44–1.00)
GFR, Estimated: 10 mL/min — ABNORMAL LOW (ref >60)
Glucose, Bld: 84 mg/dL (ref 70–99)
Phosphorus: 6.3 mg/dL — ABNORMAL HIGH (ref 2.5–4.6)
Potassium: 4.4 mmol/L (ref 3.5–5.1)
Sodium: 138 mmol/L (ref 135–145)

## 2020-11-18 LAB — HEPATIC FUNCTION PANEL
ALT: 11 U/L (ref 0–44)
AST: 27 U/L (ref 15–41)
Albumin: 2.9 g/dL — ABNORMAL LOW (ref 3.5–5.0)
Alkaline Phosphatase: 87 U/L (ref 38–126)
Bilirubin, Direct: 0.3 mg/dL — ABNORMAL HIGH (ref 0.0–0.2)
Indirect Bilirubin: 0.9 mg/dL (ref 0.3–0.9)
Total Bilirubin: 1.2 mg/dL (ref 0.3–1.2)
Total Protein: 6 g/dL — ABNORMAL LOW (ref 6.5–8.1)

## 2020-11-18 LAB — LEGIONELLA PNEUMOPHILA SEROGP 1 UR AG: L. pneumophila Serogp 1 Ur Ag: NEGATIVE

## 2020-11-18 LAB — LIPASE, BLOOD: Lipase: 34 U/L (ref 11–51)

## 2020-11-18 NOTE — Progress Notes (Signed)
Patient ID: Jenna [name], female   DOB: [birth year], 65 y.o.   MRN: [id number] [facility] Progress Note   Assessment/ Plan:   Chronic kidney disease stage IV T: continue with tacrolimus and prednisone for kidney transplant immunosuppression and awaiting tacrolimus trough level after recent decrease in dose last week. Received IV lasix x 48hrs on admit, then moved to torsemide. However, creat up today so will dc torsemide and d/w pmd about lowering CHF meds due to soft BP's.  Pneumonia: Suspected aspiration versus community-acquired, on azithromycin and ceftriaxone at this time. Acute exacerbation of systolic heart failure: CXR w/o edema but large R effusion/infiltrate, creat up today will dc torsemide.  HTN - BP's dropping lower , getting Bidil tid and 12.$RemoveBe'5mg'EgshVmlPs$  metoprolol daily. Will d/w pmd.  History of cytomegalovirus viremia: Continue valganciclovir with renal adjustment Anemia of chronic disease: Without overt blood loss and stable hemoglobin and hematocrit overnight.  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   Kelly Splinter, MD 11/18/2020, 3:10 PM     Subjective:   No c/o today, onnasal O2 at 2 L /min   Objective:   BP 109/72 (BP Location: Right Wrist)   Pulse (!) 54   Temp 97.7 F (36.5 C) (Oral)   Resp 15   Ht $R'5\' 7"'BW$  (1.702 m)   Wt 96.8 kg   SpO2 100%   BMI 33.42 kg/m   Intake/Output Summary (Last 24 hours) at 11/18/2020 1510 Last data filed at 11/18/2020 0600 Gross per 24 hour  Intake 240 ml  Output 300 ml  Net -60 ml    Weight change: 2.4 kg  Physical Exam: Gen: Appears chronically ill, uncomfortable resting up  in recliner CVS: Pulse regular rhythm, normal rate, S1 and S2 with ejection systolic murmur Resp: Poor inspiratory effort with decreased breath sounds over bases Abd: Soft, obese, nontender, bowel sounds normal Ext: 1-2+ bilateral lower extremity edema, left BCF.  Decreasing right upper extremity edema swelling.  Imaging: DG Abd 1 View  Result Date: 11/17/2020 CLINICAL DATA:  Abdominal pain, history kidney stones, initial encounter EXAM: ABDOMEN - 1 VIEW COMPARISON:  11/13/2020 CT FINDINGS: Scattered large and small bowel gas is noted. No abnormal mass or abnormal calcifications are seen. Calcifications are noted in the pelvis consistent with known uterine fibroids. No bony abnormality is seen. IMPRESSION: No acute abnormality noted. Electronically Signed   By: Inez Catalina M.D.   On: 11/17/2020 20:50   DG CHEST PORT 1 VIEW  Result Date: 11/17/2020 CLINICAL DATA:  Dyspnea EXAM: PORTABLE CHEST 1 VIEW COMPARISON:  11/13/2020 FINDINGS: Stable cardiomegaly with mild pulmonary vascular congestion. Small bilateral pleural effusions, right slightly greater than left. Dense airspace opacity in the right lower lobe, slightly worsened from prior. Patchy left basilar opacity with slightly improved aeration in the left lung base compared to prior. No pneumothorax. IMPRESSION: 1. Dense airspace opacity in the right lower lobe, slightly worsened from prior. 2. Patchy left basilar opacity with slightly improved aeration in the left lung base compared to prior. 3. Cardiomegaly with pulmonary vascular congestion and small bilateral pleural effusions. Electronically Signed   By: Davina Poke D.O.   On: 11/17/2020 15:05    Labs: BMET Recent Labs  Lab 11/13/20 1612 11/13/20 1623 11/14/20 0548 11/15/20 4492  11/16/20 0704 11/17/20 0339 11/17/20 1925 11/18/20 0355  NA 140 136 137 134* 141 134* 136 138  K 4.2 4.8 4.3 4.5 4.5 4.4 4.4 4.4  CL 110 110 104 103 107 105 105 108  CO2 19*  --  18* 19* 18* 19* 20*  20*  GLUCOSE 97 92 79 81 88 105* 100* 84  BUN 88* 97* 85* 90* 97* 97* 99* 96*  CREATININE 3.59* 3.80* 3.54* 3.59* 3.95* 3.85* 4.00* 4.48*  CALCIUM 10.4*  --  9.8 9.8 10.0 9.0 9.2 9.3  PHOS  --   --  5.6* 5.9*  --  5.1*  --  6.3*    CBC Recent Labs  Lab 11/13/20 1442 11/13/20 1623 11/14/20 0548 11/15/20 0437 11/17/20 0339  WBC 1.8*  --  1.4* 1.5* 1.4*  NEUTROABS 1.0*  --  0.9* 0.8* 0.6*  HGB 11.7* 13.9 11.9* 12.0 10.6*  HCT 36.2 41.0 37.1 37.8 32.4*  MCV 91.4  --  93.0 91.5 89.0  PLT 86*  --  71* 66* 69*     Medications:     aspirin EC  81 mg Oral q morning   cefdinir  300 mg Oral Q24H   citalopram  10 mg Oral Daily   feeding supplement (NEPRO CARB STEADY)  237 mL Oral TID BM   isosorbide-hydrALAZINE  1 tablet Oral TID   metoprolol tartrate  12.5 mg Oral BID   pantoprazole  40 mg Oral BID   predniSONE  5 mg Oral Daily   rosuvastatin  5 mg Oral q1800   sodium bicarbonate  650 mg Oral TID   sodium chloride flush  3 mL Intravenous Q12H   tacrolimus  0.5 mg Oral BID   torsemide  20 mg Oral BID   valGANciclovir  450 mg Oral QODAY

## 2020-11-18 NOTE — Progress Notes (Signed)
Palliative:  HPI: 65 y.o. female  with past medical history of CHF EF 20-25%, CAD, paroxysmal atrial fibrillation, h/o LA thrombus, lupus nephritis, focal segmental glomerularsclerosis, s/p renal transplant on immunosuppressants, stroke admitted on 11/13/2020 with bilateral leg pain and swelling, abd pain, nausea, chest pain related to CHF exacerbation, acute on chronic renal failure, pneumonia and with mencephalopathy. Admitted recently with acute kidney injury, pyelonephritis, Pseudomonas UTI, melanotic stools, acute systolic CHF, large right-sided pleural effusion and d/c SNF 9/28.     I met today with [name]'s daughter, son-in-law, brother Grayce Sessions, pastor and multiple other family members via telephone. I explained the difficult disease process with advanced kidney failure and advanced heart failure and how these interplay. We discussed the difficulty in managing these two illnesses and the difficulty of diuresis. I also explained to the my concerns if [name] gets to the point of requiring dialysis that this would be different from before. I explained that dialysis is very stressful for the heart and that sometimes patients with heart failure cannot tolerate and are not even able to do dialysis. We also discussed the risks involved in dialysis with underlying heart failure and that these could be fatal. One family member asked about peritoneal dialysis and I explained that this is not usually an option for someone with such complicated health issues as [name] but this could be better discussed with the kidney team.   Family given time to process and ask questions. Goals expressed during our meeting clear for ongoing aggressive care with hopes of maintaining as long as possible with knowledge that her status could change at any time as this is a progressive disease process. Patient and family are very spiritual and rely on their faith and faith community for support and strength. Family feel that  they still see "fight" left in [name] and she has expressed to her daughter that she is not ready to die.   All questions/concerns addressed. Emotional support provided.   Plan: - Full code.  - Full aggressive care with hopes of maintaining and hopes for some sort of stabilization in the future.   23 min  Vinie Sill, NP Palliative Medicine Team Pager [phone] (Please see [email or web address] for schedule) Team Phone [phone]    Greater than 50%  of this time was spent counseling and coordinating care related to the above assessment and plan

## 2020-11-18 NOTE — Plan of Care (Signed)
  Problem: Pain Managment: Goal: General experience of comfort will improve Outcome: Progressing   Problem: Safety: Goal: Ability to remain free from injury will improve Outcome: Progressing   

## 2020-11-18 NOTE — Progress Notes (Addendum)
Allergies  [facility] PROGRESS NOTE   Jenna [name]:[id number] DOB: 1955/07/29 DOA: 11/13/2020  PCP: [name], Betty G, MD  Brief History/Interval Summary: Jenna [name] is a 65 y.o. female with medical history significant of anemia, CAD, depression, chronic kidney disease stage IV, status post renal transplant on immunosuppressants, hypertension, focal segmental glomerularsclerosis, systolic CHF EF 28-31, lupus nephritis, ischemic cardiomyopathy, history of LA thrombus, obesity, paroxysmal atrial fibrillation, CVA of parietal lobe, known CAD status post DES to LAD in September 2020.  She was admitted earlier this month for acute kidney injury, pyelonephritis, Pseudomonas UTI, melanotic stools and acute systolic CHF.  Also was noted to have large right-sided pleural effusion.  She was stabilized and then discharged to a skilled nursing facility on 9/28.  Patient presented with complaints of bilateral leg pain and swelling, abdominal pain nausea chest pain.  She felt that she had not really improved.   Consultants: Cardiology.  Nephrology  Procedures: None yet    Subjective/Interval History: Overnight patient had pain in her upper abdomen.  Denies any abdominal pain currently.  Did not have any nausea or vomiting.  Denies any complaints at this time.  Specifically no shortness of breath.  Assessment/Plan:  Acute on chronic systolic CHF EF is known to be about 20 to 25%.  Patient is followed by cardiology at [facility].  She was noted to have bilateral lower extremity edema and JVD.   She was started on IV diuretics.  Cardiology was consulted.   Elevated troponin levels likely due to CHF and demand ischemia.  No further work-up was recommended. No ACE inhibitor or ARB due to renal failure. Noted to be on BiDil.  Continue strict ins and outs and daily weights.   Seems to be stable for the most part.  Acute on chronic kidney disease stage IV/history of  renal transplant It appears that baseline creatinine is between 3.0 and 3.5. Rise in creatinine was noted likely due to aggressive diuretics.  After a few days of IV diuretics she was transitioned to torsemide by nephrology.  Urine output not being charted accurately. A significant increase in creatinine noted over the last 24 hours.  Went from 3.85 to 4.48 today.  Will do ultrasound of her transplanted kidney.  She had a CT scan renal study on 9/29 which did not show any acute findings. Patient is on tacrolimus and steroids which is being continued.  This is being managed by nephrology.  Patient also on valganciclovir for history of CMV. Nephrology continues to follow.  Pneumonia, aspiration versus community-acquired Initially placed on ceftriaxone and azithromycin.  She developed infiltration of the azithromycin so it had to be stopped.  Changed over to oral agents and plan to do a 5-day course.  Chest x-ray was repeated yesterday which for the most part shows stable findings.  Respiratory status is stable.  She was saturating normal on room air but was placed on oxygen yesterday for comfort reasons.    Epigastric abdominal pain Reason is unclear.  LFTs are normal.  Lipase is normal.  She is not really tender in the right upper quadrant.  Based on previous notes that she has had on and off abdominal pain for a long time.  Underwent CT of the abdomen without contrast on 9/29 which did not show any acute findings.  Abdominal x-ray was unremarkable.  We will continue to monitor.  She does not report any pain this morning.  She is being continued on PPI.  Acute metabolic encephalopathy Likely due to her several comorbidities.  No acute findings noted in terms of neurological status.  She did undergo MRI of the brain which did not show any acute infarcts.   Neurological status is stable.  PT and OT evaluation.  Will need to return to SNF when medically improved.  Mentation is back to baseline.     History of paroxysmal atrial fibrillation Beta-blocker was initially placed on hold due to bradycardia.  Bradycardia improved.  Started on lower dose of metoprolol.   Home medication list does not show that she is on anticoagulation.  Noted to be just on aspirin.  Looks like she was on Eliquis previously but was not continued due to concern for GI bleed.  Thrombocytopenia Chronic and stable.  No evidence of overt bleeding.  History of SLE with history of lupus nephritis On Plaquenil which was being continued.  We will hold to see if there is any improvement in leukopenia.  Hypertension in the setting of chronic kidney disease stage IV  Blood pressure is reasonably well controlled.  History of coronary artery disease Followed at [facility].  Continue aspirin.  Continue statin  Anemia of chronic disease No evidence of overt bleeding currently.  Hemoglobin has been stable.  Recent Pseudomonas UTI/pyelonephritis involving transplanted kidney Recently completed 7-day course of IV antibiotics with meropenem.  Leukopenia Chronic for the most part.  Continue to monitor for now.  Could be related to her medications.  She is on hydroxychloroquine and tacrolimus both of which can cause myelosuppression.  She definitely needs to be on tacrolimus.  Could hold her Plaquenil for a day or 2 to see if there is any improvement. Will check vitamin M09 and folic acid levels.  Poor venous access/Right arm swelling from IV infiltration Her IV in the right arm infiltrated while she was getting azithromycin.  Right arm swelling improving.  Continue to keep it elevated.  Goals of care Palliative care is following.  Overall prognosis seems to be guarded to poor considering multiple hospitalizations in the last few months, poor quality of life, poor mobility.  Obesity Estimated body mass index is 33.42 kg/m as calculated from the following:   Height as of this encounter: $RemoveBeforeD'5\' 7"'RFfqTbIjhmnMEC$  (1.702 m).   Weight as  of this encounter: 96.8 kg.    DVT Prophylaxis: SCDs for now Code Status: Full code Family Communication: Daughter being updated daily. Disposition Plan: From SNF  Status is: Inpatient  Remains inpatient appropriate because:IV treatments appropriate due to intensity of illness or inability to take PO and Inpatient level of care appropriate due to severity of illness  Dispo: The patient is from: SNF              Anticipated d/c is to: SNF              Patient currently is not medically stable to d/c.   Difficult to place patient No        Medications: Scheduled:  aspirin EC  81 mg Oral q morning   azithromycin  500 mg Oral Daily   cefdinir  300 mg Oral Q24H   citalopram  10 mg Oral Daily   feeding supplement (NEPRO CARB STEADY)  237 mL Oral TID BM   hydroxychloroquine  200 mg Oral BID   isosorbide-hydrALAZINE  1 tablet Oral TID   metoprolol tartrate  12.5 mg Oral BID   pantoprazole  40 mg Oral BID   predniSONE  5 mg Oral Daily  rosuvastatin  5 mg Oral q1800   sodium bicarbonate  650 mg Oral TID   sodium chloride flush  3 mL Intravenous Q12H   tacrolimus  0.5 mg Oral BID   torsemide  20 mg Oral BID   valGANciclovir  450 mg Oral QODAY   Continuous:  sodium chloride     JEH:UDJSHF chloride, acetaminophen **OR** acetaminophen, albuterol, ALPRAZolam, calcium carbonate, HYDROmorphone (DILAUDID) injection, ondansetron (ZOFRAN) IV, oxyCODONE, sodium chloride flush  Antibiotics: Anti-infectives (From admission, onward)    Start     Dose/Rate Route Frequency Ordered Stop   11/15/20 1000  valGANciclovir (VALCYTE) 450 MG tablet TABS 450 mg        450 mg Oral Every other day 11/13/20 2328     11/15/20 0800  azithromycin (ZITHROMAX) tablet 500 mg        500 mg Oral Daily 11/14/20 0959 11/18/20 2359   11/14/20 2200  cefdinir (OMNICEF) capsule 300 mg        300 mg Oral Every 24 hours 11/14/20 0959 11/19/20 2159   11/14/20 0015  hydroxychloroquine (PLAQUENIL) tablet 200 mg         200 mg Oral 2 times daily 11/13/20 2328     11/13/20 2300  cefTRIAXone (ROCEPHIN) 2 g in sodium chloride 0.9 % 100 mL IVPB  Status:  Discontinued        2 g 200 mL/hr over 30 Minutes Intravenous Every 24 hours 11/13/20 2230 11/14/20 0959   11/13/20 2300  azithromycin (ZITHROMAX) 500 mg in sodium chloride 0.9 % 250 mL IVPB  Status:  Discontinued        500 mg 250 mL/hr over 60 Minutes Intravenous Every 24 hours 11/13/20 2230 11/14/20 0959       Objective:  Vital Signs  Vitals:   11/17/20 1918 11/17/20 2133 11/18/20 0500 11/18/20 0530  BP: 126/77 111/68  115/68  Pulse: 65 64  61  Resp: (!) $RemoveB'24 16  19  'mtoYbzyB$ Temp:  97.8 F (36.6 C)  98.4 F (36.9 C)  TempSrc:  Oral  Oral  SpO2: 100% 100%  99%  Weight:   96.8 kg   Height:        Intake/Output Summary (Last 24 hours) at 11/18/2020 1011 Last data filed at 11/18/2020 0600 Gross per 24 hour  Intake 240 ml  Output 300 ml  Net -60 ml    Filed Weights   11/16/20 0500 11/17/20 0500 11/18/20 0500  Weight: 88.3 kg 94.4 kg 96.8 kg    General appearance: Awake alert.  In no distress Resp: Normal effort at rest.  Diminished air entry at the bases.  Few crackles.  No wheezing or rhonchi. Cardio: S1-S2 is normal regular.  No S3-S4.  No rubs murmurs or bruit GI: Abdomen is soft.  Mildly tender in the epigastric area without any rebound rigidity or guarding.  No masses organomegaly. Extremities: Right arm swelling has improved.  Lower extremity edema persists.  Perhaps slightly better compared to yesterday.  Definitely improved compared to admission. Neurologic: Alert and oriented x3.  No focal neurological deficits.      Lab Results:  Data Reviewed: I have personally reviewed following labs and imaging studies  CBC: Recent Labs  Lab 11/13/20 1442 11/13/20 1623 11/14/20 0548 11/15/20 0437 11/17/20 0339  WBC 1.8*  --  1.4* 1.5* 1.4*  NEUTROABS 1.0*  --  0.9* 0.8* 0.6*  HGB 11.7* 13.9 11.9* 12.0 10.6*  HCT 36.2 41.0 37.1 37.8  32.4*  MCV 91.4  --  93.0  91.5 89.0  PLT 86*  --  71* 66* 69*     Basic Metabolic Panel: Recent Labs  Lab 11/14/20 0548 11/15/20 0437 11/16/20 0704 11/17/20 0339 11/17/20 1925 11/18/20 0355  NA 137 134* 141 134* 136 138  K 4.3 4.5 4.5 4.4 4.4 4.4  CL 104 103 107 105 105 108  CO2 18* 19* 18* 19* 20* 20*  GLUCOSE 79 81 88 105* 100* 84  BUN 85* 90* 97* 97* 99* 96*  CREATININE 3.54* 3.59* 3.95* 3.85* 4.00* 4.48*  CALCIUM 9.8 9.8 10.0 9.0 9.2 9.3  MG 2.6* 2.5*  --   --   --   --   PHOS 5.6* 5.9*  --  5.1*  --  6.3*     GFR: Estimated Creatinine Clearance: 15 mL/min (A) (by C-G formula based on SCr of 4.48 mg/dL (H)).  Liver Function Tests: Recent Labs  Lab 11/13/20 1442 11/13/20 1612 11/14/20 0548 11/15/20 0437 11/17/20 0339 11/17/20 1925 11/18/20 0355  AST SPECIMEN HEMOLYZED. HEMOLYSIS MAY AFFECT INTEGRITY OF RESULTS. 32 27  --   --  28 27  ALT SPECIMEN HEMOLYZED. HEMOLYSIS MAY AFFECT INTEGRITY OF RESULTS. 18 16  --   --  12 11  ALKPHOS SPECIMEN HEMOLYZED. HEMOLYSIS MAY AFFECT INTEGRITY OF RESULTS. 139* 125  --   --  96 87  BILITOT SPECIMEN HEMOLYZED. HEMOLYSIS MAY AFFECT INTEGRITY OF RESULTS. 1.5* 1.4*  --   --  1.3* 1.2  PROT SPECIMEN HEMOLYZED. HEMOLYSIS MAY AFFECT INTEGRITY OF RESULTS. 7.6 6.9  --   --  6.3* 6.0*  ALBUMIN SPECIMEN HEMOLYZED. HEMOLYSIS MAY AFFECT INTEGRITY OF RESULTS. 3.5 3.2* 3.3* 3.1* 3.0* 2.9*  3.0*     Recent Labs  Lab 11/13/20 1612 11/17/20 1925 11/18/20 0355  LIPASE 22 43 34      Cardiac Enzymes: Recent Labs  Lab 11/14/20 0548  CKTOTAL 55       Recent Results (from the past 240 hour(s))  SARS CORONAVIRUS 2 (TAT 6-24 HRS) Nasopharyngeal Nasopharyngeal Swab     Status: None   Collection Time: 11/11/20 12:51 PM   Specimen: Nasopharyngeal Swab  Result Value Ref Range Status   SARS Coronavirus 2 NEGATIVE NEGATIVE Final    Comment: (NOTE) SARS-CoV-2 target nucleic acids are NOT DETECTED.  The SARS-CoV-2 RNA is generally  detectable in upper and lower respiratory specimens during the acute phase of infection. Negative results do not preclude SARS-CoV-2 infection, do not rule out co-infections with other pathogens, and should not be used as the sole basis for treatment or other patient management decisions. Negative results must be combined with clinical observations, patient history, and epidemiological information. The expected result is Negative.  Fact Sheet for Patients: HairSlick.no  Fact Sheet for Healthcare Providers: quierodirigir.com  This test is not yet approved or cleared by the [place] FDA and  has been authorized for detection and/or diagnosis of SARS-CoV-2 by FDA under an Emergency Use Authorization (EUA). This EUA will remain  in effect (meaning this test can be used) for the duration of the COVID-19 declaration under Se ction 564(b)(1) of the Act, 21 U.S.C. section 360bbb-3(b)(1), unless the authorization is terminated or revoked sooner.  Performed at [facility], [address] [place], [place] [address]   Resp Panel by RT-PCR (Flu A&B, Covid) Nasopharyngeal Swab     Status: None   Collection Time: 11/13/20  5:49 PM   Specimen: Nasopharyngeal Swab; Nasopharyngeal(NP) swabs in vial transport medium  Result Value Ref Range Status  SARS Coronavirus 2 by RT PCR NEGATIVE NEGATIVE Final    Comment: (NOTE) SARS-CoV-2 target nucleic acids are NOT DETECTED.  The SARS-CoV-2 RNA is generally detectable in upper respiratory specimens during the acute phase of infection. The lowest concentration of SARS-CoV-2 viral copies this assay can detect is 138 copies/mL. A negative result does not preclude SARS-Cov-2 infection and should not be used as the sole basis for treatment or other patient management decisions. A negative result may occur with  improper specimen collection/handling, submission of specimen other than  nasopharyngeal swab, presence of viral mutation(s) within the areas targeted by this assay, and inadequate number of viral copies(<138 copies/mL). A negative result must be combined with clinical observations, patient history, and epidemiological information. The expected result is Negative.  Fact Sheet for Patients:  EntrepreneurPulse.com.au  Fact Sheet for Healthcare Providers:  IncredibleEmployment.be  This test is no t yet approved or cleared by the [place] FDA and  has been authorized for detection and/or diagnosis of SARS-CoV-2 by FDA under an Emergency Use Authorization (EUA). This EUA will remain  in effect (meaning this test can be used) for the duration of the COVID-19 declaration under Section 564(b)(1) of the Act, 21 U.S.C.section 360bbb-3(b)(1), unless the authorization is terminated  or revoked sooner.       Influenza A by PCR NEGATIVE NEGATIVE Final   Influenza B by PCR NEGATIVE NEGATIVE Final    Comment: (NOTE) The Xpert Xpress SARS-CoV-2/FLU/RSV plus assay is intended as an aid in the diagnosis of influenza from Nasopharyngeal swab specimens and should not be used as a sole basis for treatment. Nasal washings and aspirates are unacceptable for Xpert Xpress SARS-CoV-2/FLU/RSV testing.  Fact Sheet for Patients: EntrepreneurPulse.com.au  Fact Sheet for Healthcare Providers: IncredibleEmployment.be  This test is not yet approved or cleared by the [place] FDA and has been authorized for detection and/or diagnosis of SARS-CoV-2 by FDA under an Emergency Use Authorization (EUA). This EUA will remain in effect (meaning this test can be used) for the duration of the COVID-19 declaration under Section 564(b)(1) of the Act, 21 U.S.C. section 360bbb-3(b)(1), unless the authorization is terminated or revoked.  Performed at [facility], [place] [address], [place], [place] [address]   Culture, blood (Routine X 2) w Reflex to ID Panel     Status: None (Preliminary result)   Collection Time: 11/14/20  7:11 AM   Specimen: BLOOD RIGHT HAND  Result Value Ref Range Status   Specimen Description   Final    BLOOD RIGHT HAND Performed at [facility] [place] [address], [place], [place] [address]    Special Requests   Final    BOTTLES DRAWN AEROBIC ONLY Blood Culture results may not be optimal due to an inadequate volume of blood received in culture bottles Performed at [place] [address] [place], [place] [address]    Culture   Final    NO GROWTH 4 DAYS Performed at [facility], [place] [address], [place], [place] [address]    Report Status PENDING  Incomplete  MRSA Next Gen by PCR, Nasal     Status: None   Collection Time: 11/14/20  6:00 PM   Specimen: Nasal Mucosa; Nasal Swab  Result Value Ref Range Status   MRSA by PCR Next Gen NOT DETECTED NOT DETECTED Final    Comment: RESULT CALLED TO, READ BACK BY AND VERIFIED WITH: SCOTPON J $RemoveB'[id number]$  BY BATTLET Performed at [facility],  [place] [address], [place], [place] [address]        Radiology Studies: DG Abd 1 View  Result Date: 11/17/2020 CLINICAL DATA:  Abdominal pain, history kidney stones, initial encounter EXAM: ABDOMEN - 1 VIEW COMPARISON:  11/13/2020 CT FINDINGS: Scattered large and small bowel gas is noted. No abnormal mass or abnormal calcifications are seen. Calcifications are noted in the pelvis consistent with known uterine fibroids. No bony abnormality is seen. IMPRESSION: No acute abnormality noted. Electronically Signed   By: Inez Catalina M.D.   On: 11/17/2020 20:50   DG CHEST PORT 1 VIEW  Result Date: 11/17/2020 CLINICAL DATA:  Dyspnea EXAM: PORTABLE CHEST 1 VIEW COMPARISON:  11/13/2020 FINDINGS: Stable cardiomegaly with mild pulmonary vascular congestion. Small bilateral pleural effusions, right slightly  greater than left. Dense airspace opacity in the right lower lobe, slightly worsened from prior. Patchy left basilar opacity with slightly improved aeration in the left lung base compared to prior. No pneumothorax. IMPRESSION: 1. Dense airspace opacity in the right lower lobe, slightly worsened from prior. 2. Patchy left basilar opacity with slightly improved aeration in the left lung base compared to prior. 3. Cardiomegaly with pulmonary vascular congestion and small bilateral pleural effusions. Electronically Signed   By: Davina Poke D.O.   On: 11/17/2020 15:05       LOS: 4 days   [facility] Pager on [email or web address]  11/18/2020, 10:11 AM

## 2020-11-18 NOTE — Progress Notes (Signed)
Physical Therapy Treatment Patient Details Name: Jenna [name] MRN: [id number] DOB: [birth year] Today's Date: 11/18/2020   History of Present Illness Pt is a 65 y.o. female who was admitted earlier this month for acute kidney injury, pyelonephritis, Pseudomonas UTI, melanotic stools and acute systolic CHF.  Also was noted to have large right-sided pleural effusion.  She was stabilized and then discharged to a skilled nursing facility on 9/28.  Patient presented back to ED on 9/29 with complaints of bilateral leg pain and swelling, abdominal pain nausea chest pain and admitted for Acute on chronic systolic CHF and Chronic kidney disease stage IV/history of renal transplant. Past medical history significant of anemia, CAD, depression, chronic kidney disease stage IV, status post renal transplant on immunosuppressants, hypertension, focal segmental glomerularsclerosis, systolic CHF EF 62-83, lupus nephritis, ischemic cardiomyopathy, history of LA thrombus, obesity, paroxysmal atrial fibrillation, CVA of parietal lobe, known CAD status post DES to LAD in September 2020.    PT Comments    Pt on BSC on arrival and daughter assisted with pericare with therapist assisting with standing and transfer.  Pt not eager to mobilize today however was eventually agreeable to remain OOB in recliner for an hour today.      Recommendations for follow up therapy are one component of a multi-disciplinary discharge planning process, led by the attending physician.  Recommendations may be updated based on patient status, additional functional criteria and insurance authorization.  Follow Up Recommendations  SNF     Equipment Recommendations  3in1 (PT);Other (comment) (rollator)    Recommendations for Other Services       Precautions / Restrictions Precautions Precautions: Fall     Mobility  Bed Mobility               General bed mobility comments: pt on BSC on arrival    Transfers Overall  transfer level: Needs assistance Equipment used: None Transfers: Sit to/from [employer] Transfers Sit to Stand: Min assist Stand pivot transfers: Min guard       General transfer comment: pt on BSC so scooted BSC in front of bed as pt tends to lean over for daughter to assist with pericare; required min assist to rise, after daughter finished pericare, pt able to stand erect and small pivot to recliner placed close to pt; pt required increased time per her personal preference; remained on 2L O2 [place]  Ambulation/Gait             General Gait Details: pt declined, eventually agreeable to sit up in recliner after using BSC with promise of nursing checking back in one hour to assist her back to bed (has been left up for hours with previous hospitalizations per pt) (RN and Stage manager in room and aware)   Stairs             Wheelchair Mobility    Modified Rankin (Stroke Patients Only)       Balance                                            Cognition Arousal/Alertness: Awake/alert Behavior During Therapy: Flat affect Overall Cognitive Status: Within Functional Limits for tasks assessed                                 General Comments: appears more  awake/alert today, daughter present in room, pt conversing well      Exercises      General Comments        Pertinent Vitals/Pain Pain Assessment: Faces Faces Pain Scale: Hurts little more Pain Location: generalized Pain Descriptors / Indicators: Sore;Discomfort Pain Intervention(s): Repositioned;Monitored during session (reports she had IV pain meds last night which helped)    Home Living                      Prior Function            PT Goals (current goals can now be found in the care plan section) Progress towards PT goals: Progressing toward goals    Frequency    Min 2X/week      PT Plan Current plan remains appropriate    Co-evaluation               AM-PAC PT "6 Clicks" Mobility   Outcome Measure  Help needed turning from your back to your side while in a flat bed without using bedrails?: A Little Help needed moving from lying on your back to sitting on the side of a flat bed without using bedrails?: A Little Help needed moving to and from a bed to a chair (including a wheelchair)?: A Little Help needed standing up from a chair using your arms (e.g., wheelchair or bedside chair)?: A Little Help needed to walk in hospital room?: A Lot Help needed climbing 3-5 steps with a railing? : A Lot 6 Click Score: 16    End of Session Equipment Utilized During Treatment: Oxygen Activity Tolerance: Patient limited by fatigue Patient left: in chair;with call bell/phone within reach;with family/visitor present Nurse Communication: Mobility status PT Visit Diagnosis: Unsteadiness on feet (R26.81);Muscle weakness (generalized) (M62.81);Other abnormalities of gait and mobility (R26.89);Difficulty in walking, not elsewhere classified (R26.2)     Time: 9597-4718 PT Time Calculation (min) (ACUTE ONLY): 26 min  Charges:  $Therapeutic Activity: 8-22 mins                     Jannette Spanner PT, DPT Acute Rehabilitation Services Pager: [phone] Office: [place] 11/18/2020, 1:34 PM

## 2020-11-18 NOTE — Progress Notes (Signed)
Chaplain checked in with brother by phone who was at [facility] bedside and offered support.  He noted that she was trying to get some rest at the moment.  He also stated that Emmi's daughter and her husband had just left the room to go outside for some air.  Chaplain again offered support and will continue to check-in.    11/18/20 1500  Clinical Encounter Type  Visited With Family  Visit Type Initial

## 2020-11-18 NOTE — TOC Progression Note (Signed)
Transition of Care [facility]) - Progression Note    Patient Details  Name: Jenna [name] MRN: [id number] Date of Birth: 09-14-1955  Transition of Care [facility]) CM/SW Contact  Leeroy Cha, RN Phone Number: 11/18/2020, 9:19 AM  Clinical Narrative:    Assessment/ Plan:   Chronic kidney disease stage IV T: Renal function stable today, sp IV lasix x 48hrs and now on torsemide 20 bid.  Continue with tacrolimus and prednisone for kidney transplant immunosuppression and awaiting tacrolimus trough level after recent decrease in dose last week Pneumonia: Suspected aspiration versus community-acquired, on azithromycin and ceftriaxone at this time. Acute exacerbation of systolic heart failure: Unclear accuracy of urine output or weight. Has diffuse non-pitting edema legs, CXR w/o edema but large R effusion/infiltrate and CM.  HTN - BP's dropping into 110's now, getting Bidil tid and 12.$RemoveBe'5mg'CaOEplTQa$  metoprolol daily.  History of cytomegalovirus viremia: Continue valganciclovir with renal adjustment Anemia of chronic disease: Without overt blood loss and stable hemoglobin and hematocrit overnight.  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   TOC PLAN OF CARE: Following for toc needs and possible placement.  Lives alone.   Expected Discharge Plan: Home/Self Care Barriers to Discharge: Continued Medical Work up  Expected Discharge Plan and Services Expected Discharge Plan: Home/Self Care   Discharge Planning Services: CM Consult   Living arrangements for the past 2 months: Apartment Expected Discharge Date:  (unknown)                                      Social Determinants of Health (SDOH) Interventions    Readmission Risk Interventions Readmission Risk Prevention Plan 11/05/2020 08/26/2020  Transportation Screening Complete Complete  PCP or Specialist Appt within 3-5 Days Complete -  [place]RI or [place] Complete Complete  Social Work Consult for [place] Planning/Counseling Complete Complete  Palliative Care Screening Not Applicable Not Applicable  Medication Review [employer] photographer) Complete Complete  Some recent data might be hidden

## 2020-11-18 NOTE — Plan of Care (Signed)
  Problem: Clinical Measurements: Goal: Will remain free from infection Outcome: Progressing Goal: Diagnostic test results will improve Outcome: Progressing Goal: Respiratory complications will improve Outcome: Progressing Goal: Cardiovascular complication will be avoided Outcome: Progressing   

## 2020-11-18 NOTE — Progress Notes (Signed)
Pt reported that she voided around 1000 this morning and has not have urine output up until now. Bladder scanned pt and only showed 38ml~. NP Olena Heckle made aware. Will continue to monitor

## 2020-11-19 DIAGNOSIS — Z940 Kidney transplant status: Secondary | ICD-10-CM

## 2020-11-19 DIAGNOSIS — M3214 Glomerular disease in systemic lupus erythematosus: Secondary | ICD-10-CM

## 2020-11-19 DIAGNOSIS — E785 Hyperlipidemia, unspecified: Secondary | ICD-10-CM

## 2020-11-19 DIAGNOSIS — D696 Thrombocytopenia, unspecified: Secondary | ICD-10-CM

## 2020-11-19 DIAGNOSIS — B259 Cytomegaloviral disease, unspecified: Secondary | ICD-10-CM

## 2020-11-19 DIAGNOSIS — E8779 Other fluid overload: Secondary | ICD-10-CM

## 2020-11-19 DIAGNOSIS — I2510 Atherosclerotic heart disease of native coronary artery without angina pectoris: Secondary | ICD-10-CM

## 2020-11-19 DIAGNOSIS — R778 Other specified abnormalities of plasma proteins: Secondary | ICD-10-CM

## 2020-11-19 LAB — FERRITIN: Ferritin: 267 ng/mL (ref 11–307)

## 2020-11-19 LAB — CBC WITH DIFFERENTIAL/PLATELET
Abs Immature Granulocytes: 0.45 10*3/uL — ABNORMAL HIGH (ref 0.00–0.07)
Basophils Absolute: 0 10*3/uL (ref 0.0–0.1)
Basophils Relative: 1 %
Eosinophils Absolute: 0 10*3/uL (ref 0.0–0.5)
Eosinophils Relative: 1 %
HCT: 34.4 % — ABNORMAL LOW (ref 36.0–46.0)
Hemoglobin: 10.9 g/dL — ABNORMAL LOW (ref 12.0–15.0)
Immature Granulocytes: 23 %
Lymphocytes Relative: 17 %
Lymphs Abs: 0.3 10*3/uL — ABNORMAL LOW (ref 0.7–4.0)
MCH: 29 pg (ref 26.0–34.0)
MCHC: 31.7 g/dL (ref 30.0–36.0)
MCV: 91.5 fL (ref 80.0–100.0)
Monocytes Absolute: 0.4 10*3/uL (ref 0.1–1.0)
Monocytes Relative: 21 %
Neutro Abs: 0.8 10*3/uL — ABNORMAL LOW (ref 1.7–7.7)
Neutrophils Relative %: 37 %
Platelets: 69 10*3/uL — ABNORMAL LOW (ref 150–400)
RBC: 3.76 MIL/uL — ABNORMAL LOW (ref 3.87–5.11)
RDW: 21.1 % — ABNORMAL HIGH (ref 11.5–15.5)
WBC: 2 10*3/uL — ABNORMAL LOW (ref 4.0–10.5)
nRBC: 3.5 % — ABNORMAL HIGH (ref 0.0–0.2)

## 2020-11-19 LAB — CULTURE, BLOOD (ROUTINE X 2): Culture: NO GROWTH

## 2020-11-19 LAB — RENAL FUNCTION PANEL
Albumin: 3 g/dL — ABNORMAL LOW (ref 3.5–5.0)
Anion gap: 13 (ref 5–15)
BUN: 97 mg/dL — ABNORMAL HIGH (ref 8–23)
CO2: 19 mmol/L — ABNORMAL LOW (ref 22–32)
Calcium: 9.4 mg/dL (ref 8.9–10.3)
Chloride: 101 mmol/L (ref 98–111)
Creatinine, Ser: 4.64 mg/dL — ABNORMAL HIGH (ref 0.44–1.00)
GFR, Estimated: 10 mL/min — ABNORMAL LOW (ref >60)
Glucose, Bld: 101 mg/dL — ABNORMAL HIGH (ref 70–99)
Phosphorus: 5.5 mg/dL — ABNORMAL HIGH (ref 2.5–4.6)
Potassium: 4.8 mmol/L (ref 3.5–5.1)
Sodium: 133 mmol/L — ABNORMAL LOW (ref 135–145)

## 2020-11-19 LAB — RETICULOCYTES
Immature Retic Fract: 29.2 % — ABNORMAL HIGH (ref 2.3–15.9)
RBC.: 3.76 MIL/uL — ABNORMAL LOW (ref 3.87–5.11)
Retic Count, Absolute: 87.2 10*3/uL (ref 19.0–186.0)
Retic Ct Pct: 2.3 % (ref 0.4–3.1)

## 2020-11-19 LAB — IRON AND TIBC
Iron: 26 ug/dL — ABNORMAL LOW (ref 28–170)
Saturation Ratios: 11 % (ref 10.4–31.8)
TIBC: 229 ug/dL — ABNORMAL LOW (ref 250–450)
UIBC: 203 ug/dL

## 2020-11-19 LAB — TACROLIMUS LEVEL: Tacrolimus (FK506) - LabCorp: 10 ng/mL (ref 2.0–20.0)

## 2020-11-19 LAB — FOLATE: Folate: 17.1 ng/mL (ref >5.9)

## 2020-11-19 LAB — VITAMIN B12: Vitamin B-12: 1202 pg/mL — ABNORMAL HIGH (ref 180–914)

## 2020-11-19 MED ORDER — ACETAMINOPHEN 500 MG PO TABS
1000.0000 mg | ORAL_TABLET | Freq: Three times a day (TID) | ORAL | Status: DC
  Administered 2020-11-19 – 2020-11-24 (×11): 1000 mg via ORAL
  Filled 2020-11-19 (×11): qty 2

## 2020-11-19 MED ORDER — SODIUM CHLORIDE 0.9 % IV SOLN: INTRAVENOUS | Status: DC

## 2020-11-19 NOTE — Care Management Important Message (Signed)
Important Message  Patient Details IM Letter given to the Patient. Name: Jenna [name] MRN: [id number] Date of Birth: 05/07/1955   Medicare Important Message Given:  Yes     Kerin Salen 11/19/2020, 1:25 PM

## 2020-11-19 NOTE — Progress Notes (Signed)
OT Cancellation Note  Patient Details Name: ANALIS DISTLER MRN: [id number] DOB: [birth year]   Cancelled Treatment:    Reason Eval/Treat Not Completed: Patient declined, no reason specified. Despite multiple attempts to engage patient patient refused participation. Of note - palliative care meeting held with family - but unsure of [place].   Ellason Segar L [place] 11/19/2020, 8:25 AM

## 2020-11-19 NOTE — Progress Notes (Signed)
Patient ID: CALI HOPE, female   DOB: 15-Jul-1955, 65 y.o.   MRN: [id number] [facility] Progress Note   Assessment/ Plan:   Chronic kidney disease stage IV T: continue with tacrolimus and prednisone for kidney transplant immunosuppression and awaiting tacrolimus trough level after recent decrease in dose last week. Received IV lasix x 48hrs on admit, then switched to torsemide. Torsemide dc'd yesterday d/t rising creat. Creat up again today. Suspect pt is dry, will start gentle IVF"s. Lower bp's prompted dc of BB yesterday and dc of Bidil today which I agree with. If not improving soon will need attempt at dialysis to see if she gets any stronger.  If she does not improve, doubt she would be OP HD candidate. Will follow.  Pneumonia: Suspected aspiration versus community-acquired, on azithromycin and ceftriaxone at this time. Acute exacerbation of systolic heart failure: CXR w/o edema but large R effusion/infiltrate.  HTN - BP's dropping lower, Bidil dc'd today. No bp lowering meds at this time.  History of cytomegalovirus viremia: Continue valganciclovir with renal adjustment Anemia of chronic disease: Without overt blood loss and stable hemoglobin and hematocrit overnight.  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   Kelly Splinter, MD 11/19/2020, 3:40 PM     Subjective:   No c/o today, onnasal O2 at 2 L /min   Objective:   BP 121/83 (BP Location: Right Arm)   Pulse 78   Temp 97.6 F (36.4 C) (Oral)   Resp (!) 22   Ht $R'5\' 7"'Ys$  (1.702 m)   Wt 96.9 kg   SpO2 94%   BMI 33.46 kg/m   Intake/Output Summary (Last 24 hours) at 11/19/2020  1540 Last data filed at 11/19/2020 0600 Gross per 24 hour  Intake 240 ml  Output 90 ml  Net 150 ml    Weight change: 0.1 kg  Physical Exam: Gen: Appears chronically ill, uncomfortable resting up in recliner CVS: Pulse regular rhythm, normal rate, S1 and S2 with ejection systolic murmur Resp: Poor inspiratory effort with decreased breath sounds over bases Abd: Soft, obese, nontender, bowel sounds normal Ext: 1-2+ bilateral lower extremity edema, left BCF.  Decreasing right upper extremity edema swelling.  Imaging: DG Abd 1 View  Result Date: 11/17/2020 CLINICAL DATA:  Abdominal pain, history kidney stones, initial encounter EXAM: ABDOMEN - 1 VIEW COMPARISON:  11/13/2020 CT FINDINGS: Scattered large and small bowel gas is noted. No abnormal mass or abnormal calcifications are seen. Calcifications are noted in the pelvis consistent with known uterine fibroids. No bony abnormality is seen. IMPRESSION: No acute abnormality noted. Electronically Signed   By: Inez Catalina M.D.   On: 11/17/2020 20:50   US Renal Transplant w/Doppler  Result Date: 11/19/2020 CLINICAL DATA:  Acute kidney injury EXAM: ULTRASOUND OF RENAL TRANSPLANT WITH RENAL DOPPLER ULTRASOUND TECHNIQUE: Ultrasound examination of the renal transplant was performed with gray-scale, color and duplex doppler evaluation. COMPARISON:  11/03/2020 FINDINGS: Transplant kidney location: Right lower quadrant Transplant Kidney: Renal measurements: 10.7 x 5.1 x 5.8 cm = volume: 157mL. Normal in size and parenchymal echogenicity. No evidence of mass or hydronephrosis. No peri-transplant fluid collection seen. Color flow in the main renal artery:  Yes Color flow in the main renal vein:  Yes Duplex Doppler Evaluation: Main Renal  Artery Velocity: 87 cm/sec Main Renal Artery Resistive Index: 0.87 Venous waveform in main renal vein:  Present Intrarenal resistive index in upper pole:  0.79 (normal 0.6-0.8; equivocal 0.8-0.9; abnormal >= 0.9) Intrarenal  resistive index in lower pole: 0.80 (normal 0.6-0.8; equivocal 0.8-0.9; abnormal >= 0.9) Bladder: Minimally dilated. No significant abnormality of the bladder. Other findings:  None. IMPRESSION: 1. Intra-renal resistive indices within normal limits. 2. Main transplant renal artery and renal vein are patent. 3. Normal grayscale appearance of the transplant kidney. Electronically Signed   By: Miachel Roux M.D.   On: 11/19/2020 08:17    Labs: BMET Recent Labs  Lab 11/14/20 0548 11/15/20 0437 11/16/20 0704 11/17/20 0339 11/17/20 1925 11/18/20 0355 11/19/20 0355  NA 137 134* 141 134* 136 138 133*  K 4.3 4.5 4.5 4.4 4.4 4.4 4.8  CL 104 103 107 105 105 108 101  CO2 18* 19* 18* 19* 20* 20* 19*  GLUCOSE 79 81 88 105* 100* 84 101*  BUN 85* 90* 97* 97* 99* 96* 97*  CREATININE 3.54* 3.59* 3.95* 3.85* 4.00* 4.48* 4.64*  CALCIUM 9.8 9.8 10.0 9.0 9.2 9.3 9.4  PHOS 5.6* 5.9*  --  5.1*  --  6.3* 5.5*    CBC Recent Labs  Lab 11/14/20 0548 11/15/20 0437 11/17/20 0339 11/19/20 0652  WBC 1.4* 1.5* 1.4* 2.0*  NEUTROABS 0.9* 0.8* 0.6* 0.8*  HGB 11.9* 12.0 10.6* 10.9*  HCT 37.1 37.8 32.4* 34.4*  MCV 93.0 91.5 89.0 91.5  PLT 71* 66* 69* 69*     Medications:     acetaminophen  1,000 mg Oral TID   aspirin EC  81 mg Oral q morning   citalopram  10 mg Oral Daily   feeding supplement (NEPRO CARB STEADY)  237 mL Oral TID BM   pantoprazole  40 mg Oral BID   predniSONE  5 mg Oral Daily   rosuvastatin  5 mg Oral q1800   sodium bicarbonate  650 mg Oral TID   sodium chloride flush  3 mL Intravenous Q12H   tacrolimus  0.5 mg Oral BID   valGANciclovir  450 mg Oral QODAY

## 2020-11-19 NOTE — Progress Notes (Signed)
PROGRESS NOTE    Jenna [name]  UYQ:[id number] DOB: [birth year] DOA: 11/13/2020 PCP: [name], Betty G, MD   Brief Narrative:  Jenna [name] is a 65 y.o. female with medical history significant of anemia, CAD, depression, chronic kidney disease stage IV, status post renal transplant on immunosuppressants, hypertension, focal segmental glomerularsclerosis, systolic CHF EF 63-87, lupus nephritis, ischemic cardiomyopathy, history of LA thrombus, obesity, paroxysmal atrial fibrillation, CVA of parietal lobe, known CAD status post DES to LAD in September 2020.  She was admitted earlier this month for acute kidney injury, pyelonephritis, Pseudomonas UTI, melanotic stools and acute systolic CHF.  Also was noted to have large right-sided pleural effusion.  She was stabilized and then discharged to a skilled nursing facility on 9/28.  Patient presented with complaints of bilateral leg pain and swelling, abdominal pain nausea chest pain.  She felt that she had not really improved.  Assessment & Plan:  Acute on chronic systolic CHF - EF is known to be about 20 to 25%.   - [facility] - started on IV diuretics.  Cardiology was consulted initially with worsening creatinine - diuretics now off (see below) -Discontinue BiDil given ongoing hypotension  Acute on chronic kidney disease stage IV/history of renal transplant -Baseline creatinine 3 -3.5  -Off diuretics as of 11/18/2020 given worsening creatinine at 4.6 today -Nephrology following, appreciate insight and recommendations -Previous CT of the abdomen did not show any abnormalities of the renal transplant, renal ultrasound shows main transplant renal artery and vein are patent with normal grayscale appearance - On valganciclovir for history of CMV.   Pneumonia, aspiration versus community-acquired -Completed antibiotic therapy -Patient continues to sat well into the 90s on room air, continues to be placed on oxygen supplementation  for comfort reasons by staff, will continue to aggressively wean off oxygen unless medically indicated   Epigastric abdominal pain, resolved -Abdomen benign today, previous imaging unremarkable  Acute metabolic encephalopathy, ongoing - Likely multifactorial given above infection, worsening creatinine/BUN  - High risk for sundowning given age and comorbidities  - Previous imaging of the head and brain unremarkable   History of paroxysmal atrial fibrillation, rate controlled -Transient bradycardia, beta-blockers held in the interim, will resume once indicated but heart rate remains well controlled in the 60s and 70s off metoprolol  - Home medication list does not show that she is on anticoagulation.  Noted to be just on aspirin.  Looks like she was on Eliquis previously but was not continued due to concern for GI bleed.   Pancytopenia -Chronic, remained stable -Likely secondary to SLE/CKD/chronic disease, She is on hydroxychloroquine and tacrolimus both of which can cause myelosuppression  History of SLE with history of lupus nephritis - On Plaquenil which was being continued.  Minimal to no improvement in pancytopenia  Hypertension in the setting of chronic kidney disease stage IV  -Off medications given above AKI and bradycardia  History of coronary artery disease [facility] at [facility].  Continue aspirin.  Continue statin  Recent Pseudomonas UTI/pyelonephritis involving transplanted kidney Recently completed 7-day course of IV antibiotics with meropenem.   Goals of care Palliative care is following.  Overall prognosis seems to be guarded to poor considering multiple hospitalizations in the last few months, poor quality of life, poor mobility.   Obesity Estimated body mass index is 33.42 kg/m as calculated from the following:   Height as of this encounter: $RemoveBeforeD'5\' 7"'[id number]$  (1.702 m).   Weight as of this encounter: 96.8 kg.  DVT prophylaxis:  SCDs Code Status: Full Family  Communication: None available  Status is: Inpatient  Dispo: The patient is from: Home              Anticipated d/c is to: To be determined              Anticipated d/c date is: 48 to 72 hours              Patient currently not medically stable for discharge  Consultants:  Nephrology  Procedures:  None  Antimicrobials:  Completed as above  Subjective: No acute issues or events overnight denies nausea vomiting diarrhea constipation headache fevers chills or chest pain  Objective: Vitals:   11/18/20 1215 11/18/20 2030 11/19/20 0442 11/19/20 0500  BP: 109/72 114/63 (!) 112/53   Pulse: (!) 54 61 64   Resp: $Remo'15 18 18   'iiABz$ Temp: 97.7 F (36.5 C) 98.1 F (36.7 C) 98 F (36.7 C)   TempSrc: Oral Oral Oral   SpO2: 100% 97% 94%   Weight:    96.9 kg  Height:        Intake/Output Summary (Last 24 hours) at 11/19/2020 0756 Last data filed at 11/19/2020 0600 Gross per 24 hour  Intake 240 ml  Output 90 ml  Net 150 ml   Filed Weights   11/17/20 0500 11/18/20 0500 11/19/20 0500  Weight: 94.4 kg 96.8 kg 96.9 kg    Examination:  General exam: Appears calm and comfortable, oriented to person and general situation only Respiratory system: Clear to auscultation. Respiratory effort normal. Cardiovascular system: S1 & S2 heard, RRR. No JVD, murmurs, rubs, gallops or clicks. No pedal edema. Gastrointestinal system: Abdomen is nondistended, soft and nontender. No organomegaly or masses felt. Normal bowel sounds heard. Central nervous system: Alert and oriented. No focal neurological deficits. Extremities: Symmetric 5 x 5 power. Skin: No rashes, lesions or ulcers Psychiatry: Judgement and insight appear normal. Mood & affect appropriate.   Data Reviewed: I have personally reviewed following labs and imaging studies  CBC: Recent Labs  Lab 11/13/20 1442 11/13/20 1623 11/14/20 0548 11/15/20 0437 11/17/20 0339 11/19/20 0652  WBC 1.8*  --  1.4* 1.5* 1.4* 2.0*  NEUTROABS 1.0*  --  0.9*  0.8* 0.6* PENDING  HGB 11.7* 13.9 11.9* 12.0 10.6* 10.9*  HCT 36.2 41.0 37.1 37.8 32.4* 34.4*  MCV 91.4  --  93.0 91.5 89.0 91.5  PLT 86*  --  71* 66* 69* 69*   Basic Metabolic Panel: Recent Labs  Lab 11/14/20 0548 11/15/20 0437 11/16/20 0704 11/17/20 0339 11/17/20 1925 11/18/20 0355 11/19/20 0355  NA 137 134* 141 134* 136 138 133*  K 4.3 4.5 4.5 4.4 4.4 4.4 4.8  CL 104 103 107 105 105 108 101  CO2 18* 19* 18* 19* 20* 20* 19*  GLUCOSE 79 81 88 105* 100* 84 101*  BUN 85* 90* 97* 97* 99* 96* 97*  CREATININE 3.54* 3.59* 3.95* 3.85* 4.00* 4.48* 4.64*  CALCIUM 9.8 9.8 10.0 9.0 9.2 9.3 9.4  MG 2.6* 2.5*  --   --   --   --   --   PHOS 5.6* 5.9*  --  5.1*  --  6.3* 5.5*   GFR: Estimated Creatinine Clearance: 14.4 mL/min (A) (by C-G formula based on SCr of 4.64 mg/dL (H)). Liver Function Tests: Recent Labs  Lab 11/13/20 1442 11/13/20 1612 11/14/20 0548 11/15/20 0437 11/17/20 0339 11/17/20 1925 11/18/20 0355 11/19/20 0355  AST SPECIMEN HEMOLYZED. HEMOLYSIS MAY AFFECT INTEGRITY OF RESULTS. [place]  27  --   --  28 27  --   ALT SPECIMEN HEMOLYZED. HEMOLYSIS MAY AFFECT INTEGRITY OF RESULTS. 18 16  --   --  12 11  --   ALKPHOS SPECIMEN HEMOLYZED. HEMOLYSIS MAY AFFECT INTEGRITY OF RESULTS. 139* 125  --   --  96 87  --   BILITOT SPECIMEN HEMOLYZED. HEMOLYSIS MAY AFFECT INTEGRITY OF RESULTS. 1.5* 1.4*  --   --  1.3* 1.2  --   PROT SPECIMEN HEMOLYZED. HEMOLYSIS MAY AFFECT INTEGRITY OF RESULTS. 7.6 6.9  --   --  6.3* 6.0*  --   ALBUMIN SPECIMEN HEMOLYZED. HEMOLYSIS MAY AFFECT INTEGRITY OF RESULTS. 3.5 3.2* 3.3* 3.1* 3.0* 2.9*  3.0* 3.0*   Recent Labs  Lab 11/13/20 1612 11/17/20 1925 11/18/20 0355  LIPASE 22 43 34   No results for input(s): AMMONIA in the last 168 hours. Coagulation Profile: No results for input(s): INR, PROTIME in the last 168 hours. Cardiac Enzymes: Recent Labs  Lab 11/14/20 0548  CKTOTAL 55   BNP (last 3 results) No results for input(s): PROBNP in the last  8760 hours. HbA1C: No results for input(s): HGBA1C in the last 72 hours. CBG: No results for input(s): GLUCAP in the last 168 hours. Lipid Profile: No results for input(s): CHOL, HDL, LDLCALC, TRIG, CHOLHDL, LDLDIRECT in the last 72 hours. Thyroid Function Tests: No results for input(s): TSH, T4TOTAL, FREET4, T3FREE, THYROIDAB in the last 72 hours. Anemia Panel: Recent Labs    11/19/20 0355 11/19/20 0652  VITAMINB12 1,202*  --   FOLATE 17.1  --   FERRITIN 267  --   TIBC 229*  --   IRON 26*  --   RETICCTPCT  --  2.3   Sepsis Labs: Recent Labs  Lab 11/14/20 0548 11/15/20 0437  PROCALCITON 0.31 0.45  LATICACIDVEN 2.3*  --     Recent Results (from the past 240 hour(s))  SARS CORONAVIRUS 2 (TAT 6-24 HRS) Nasopharyngeal Nasopharyngeal Swab     Status: None   Collection Time: 11/11/20 12:51 PM   Specimen: Nasopharyngeal Swab  Result Value Ref Range Status   SARS Coronavirus 2 NEGATIVE NEGATIVE Final    Comment: (NOTE) SARS-CoV-2 target nucleic acids are NOT DETECTED.  The SARS-CoV-2 RNA is generally detectable in upper and lower respiratory specimens during the acute phase of infection. Negative results do not preclude SARS-CoV-2 infection, do not rule out co-infections with other pathogens, and should not be used as the sole basis for treatment or other patient management decisions. Negative results must be combined with clinical observations, patient history, and epidemiological information. The expected result is Negative.  Fact Sheet for Patients: SugarRoll.be  Fact Sheet for Healthcare Providers: https://www.woods-mathews.com/  This test is not yet approved or cleared by the [place] FDA and  has been authorized for detection and/or diagnosis of SARS-CoV-2 by FDA under an Emergency Use Authorization (EUA). This EUA will remain  in effect (meaning this test can be used) for the duration of the COVID-19 declaration  under Se ction 564(b)(1) of the Act, 21 U.S.C. section 360bbb-3(b)(1), unless the authorization is terminated or revoked sooner.  Performed at [facility], [place] [address], [place], [place] [address]   Resp Panel by RT-PCR (Flu A&B, Covid) Nasopharyngeal Swab     Status: None   Collection Time: 11/13/20  5:49 PM   Specimen: Nasopharyngeal Swab; Nasopharyngeal(NP) swabs in vial transport medium  Result Value Ref Range Status   SARS Coronavirus 2 by RT PCR  NEGATIVE NEGATIVE Final    Comment: (NOTE) SARS-CoV-2 target nucleic acids are NOT DETECTED.  The SARS-CoV-2 RNA is generally detectable in upper respiratory specimens during the acute phase of infection. The lowest concentration of SARS-CoV-2 viral copies this assay can detect is 138 copies/mL. A negative result does not preclude SARS-Cov-2 infection and should not be used as the sole basis for treatment or other patient management decisions. A negative result may occur with  improper specimen collection/handling, submission of specimen other than nasopharyngeal swab, presence of viral mutation(s) within the areas targeted by this assay, and inadequate number of viral copies(<138 copies/mL). A negative result must be combined with clinical observations, patient history, and epidemiological information. The expected result is Negative.  Fact Sheet for Patients:  EntrepreneurPulse.com.au  Fact Sheet for Healthcare Providers:  IncredibleEmployment.be  This test is no t yet approved or cleared by the [place] FDA and  has been authorized for detection and/or diagnosis of SARS-CoV-2 by FDA under an Emergency Use Authorization (EUA). This EUA will remain  in effect (meaning this test can be used) for the duration of the COVID-19 declaration under Section 564(b)(1) of the Act, 21 U.S.C.section 360bbb-3(b)(1), unless the authorization is terminated  or revoked sooner.       Influenza  A by PCR NEGATIVE NEGATIVE Final   Influenza B by PCR NEGATIVE NEGATIVE Final    Comment: (NOTE) The Xpert Xpress SARS-CoV-2/FLU/RSV plus assay is intended as an aid in the diagnosis of influenza from Nasopharyngeal swab specimens and should not be used as a sole basis for treatment. Nasal washings and aspirates are unacceptable for Xpert Xpress SARS-CoV-2/FLU/RSV testing.  Fact Sheet for Patients: EntrepreneurPulse.com.au  Fact Sheet for Healthcare Providers: IncredibleEmployment.be  This test is not yet approved or cleared by the [place] FDA and has been authorized for detection and/or diagnosis of SARS-CoV-2 by FDA under an Emergency Use Authorization (EUA). This EUA will remain in effect (meaning this test can be used) for the duration of the COVID-19 declaration under Section 564(b)(1) of the Act, 21 U.S.C. section 360bbb-3(b)(1), unless the authorization is terminated or revoked.  Performed at [facility], [place] [address] [place], [place] [address]   Culture, blood (Routine X 2) w Reflex to ID Panel     Status: None   Collection Time: 11/14/20  7:11 AM   Specimen: BLOOD RIGHT HAND  Result Value Ref Range Status   Specimen Description   Final    BLOOD RIGHT HAND Performed at [facility] [address], [place], [place] [address]    Special Requests   Final    BOTTLES DRAWN AEROBIC ONLY Blood Culture results may not be optimal due to an inadequate volume of blood received in culture bottles Performed at [place] [address], [place], [place] [address]    Culture   Final    NO GROWTH 5 DAYS Performed at [facility], [place] [address], [place], [place] [address]    Report Status 11/19/2020 FINAL  Final  MRSA Next Gen by PCR, Nasal     Status: None   Collection Time: 11/14/20  6:00 PM   Specimen: Nasal Mucosa; Nasal Swab  Result Value Ref Range Status   MRSA  by PCR Next Gen NOT DETECTED NOT DETECTED Final    Comment: RESULT CALLED TO, READ BACK BY AND VERIFIED WITH: SCOTPON J $RemoveB'@[id number]$  BY BATTLET Performed at [place] [address], [place],  [address]  Radiology Studies: DG Abd 1 View  Result Date: 11/17/2020 CLINICAL DATA:  Abdominal pain, history kidney stones, initial encounter EXAM: ABDOMEN - 1 VIEW COMPARISON:  11/13/2020 CT FINDINGS: Scattered large and small bowel gas is noted. No abnormal mass or abnormal calcifications are seen. Calcifications are noted in the pelvis consistent with known uterine fibroids. No bony abnormality is seen. IMPRESSION: No acute abnormality noted. Electronically Signed   By: Inez Catalina M.D.   On: 11/17/2020 20:50   DG CHEST PORT 1 VIEW  Result Date: 11/17/2020 CLINICAL DATA:  Dyspnea EXAM: PORTABLE CHEST 1 VIEW COMPARISON:  11/13/2020 FINDINGS: Stable cardiomegaly with mild pulmonary vascular congestion. Small bilateral pleural effusions, right slightly greater than left. Dense airspace opacity in the right lower lobe, slightly worsened from prior. Patchy left basilar opacity with slightly improved aeration in the left lung base compared to prior. No pneumothorax. IMPRESSION: 1. Dense airspace opacity in the right lower lobe, slightly worsened from prior. 2. Patchy left basilar opacity with slightly improved aeration in the left lung base compared to prior. 3. Cardiomegaly with pulmonary vascular congestion and small bilateral pleural effusions. Electronically Signed   By: Davina Poke D.O.   On: 11/17/2020 15:05    Scheduled Meds:  aspirin EC  81 mg Oral q morning   citalopram  10 mg Oral Daily   feeding supplement (NEPRO CARB STEADY)  237 mL Oral TID BM   isosorbide-hydrALAZINE  1 tablet Oral TID   pantoprazole  40 mg Oral BID   predniSONE  5 mg Oral Daily   rosuvastatin  5 mg Oral q1800   sodium bicarbonate  650 mg Oral TID   sodium chloride flush  3 mL  Intravenous Q12H   tacrolimus  0.5 mg Oral BID   valGANciclovir  450 mg Oral QODAY   Continuous Infusions:  sodium chloride       LOS: 5 days   Time spent: 25min  Gurshaan Matsuoka C Tijah Hane, DO [facility]  If 7PM-7AM, please contact night-coverage [email or web address]  11/19/2020, 7:56 AM

## 2020-11-19 NOTE — TOC Progression Note (Signed)
Transition of Care [facility]) - Progression Note    Patient Details  Name: Jenna [name] MRN: [id number] Date of Birth: 1955-09-03  [facility] of [facility] CM/SW Contact  Ross Ludwig, [place] Phone Number: 11/19/2020, 4:34 PM  Clinical Narrative:     Patient is from [facility] for short term rehab benefits.  Plan to return back to SNF once medically ready for discharge.   Expected Discharge Plan: Home/Self Care Barriers to Discharge: Continued Medical Work up  Expected Discharge Plan and Services Expected Discharge Plan: Home/Self Care   Discharge Planning Services: CM Consult   Living arrangements for the past 2 months: Apartment Expected Discharge Date:  (unknown)                                     Social Determinants of Health (SDOH) Interventions    Readmission Risk Interventions Readmission Risk Prevention Plan 11/18/2020 11/05/2020 08/26/2020  Transportation Screening Complete Complete Complete  PCP or Specialist Appt within 3-5 Days - Complete -  [place] or [place] - Complete Complete  Social Work Consult for [place] Planning/Counseling - Complete Complete  Palliative Care Screening - Not Applicable Not Applicable  Medication Review Press photographer) Complete Complete Complete  PCP or Specialist appointment within 3-5 days of discharge Complete - -  [place] or Home Care Consult Complete - -  SW Recovery Care/Counseling Consult Complete - -  Palliative Care Screening Not Applicable - -  [place] Not Complete - -  SNF Comments possible placement - -  Some recent data might be hidden

## 2020-11-19 NOTE — Plan of Care (Signed)
  Problem: Clinical Measurements: Goal: Will remain free from infection Outcome: Progressing Goal: Respiratory complications will improve Outcome: Progressing Goal: Cardiovascular complication will be avoided Outcome: Progressing   Problem: Coping: Goal: Level of anxiety will decrease Outcome: Progressing   Problem: Pain Managment: Goal: General experience of comfort will improve Outcome: Progressing   Problem: Clinical Measurements: Goal: Diagnostic test results will improve Outcome: Not Progressing

## 2020-11-19 NOTE — Progress Notes (Signed)
Palliative:  HPI: 65 y.o. female  with past medical history of CHF EF 20-25%, CAD, paroxysmal atrial fibrillation, h/o LA thrombus, lupus nephritis, focal segmental glomerularsclerosis, s/p renal transplant on immunosuppressants, stroke admitted on 11/13/2020 with bilateral leg pain and swelling, abd pain, nausea, chest pain related to CHF exacerbation, acute on chronic renal failure, pneumonia and with mencephalopathy. Admitted recently with acute kidney injury, pyelonephritis, Pseudomonas UTI, melanotic stools, acute systolic CHF, large right-sided pleural effusion and d/c SNF 9/28.     I met today with Jenna [name] and daughter Jenna [name] at bedside. [name] is in better spirits today and is very comical and making many jokes. She has no complaints at this time. Continues with poor intake and discussed ways to improve. Clarified with Jenna [name] who confirms no further questions/concerns  from herself or other family members from our conversation yesterday. Discussed with them that creatinine continues to rise and worry with decreased urine output. Jenna [name] is concerned but also reassured by her mother's improved spirits. We have already had previous conversations about [name] and likelihood of poor tolerance of dialysis if this was indicated. [name] has wanted to avoid dialysis if at all possible. However, they also want to continue with all efforts to prolong life. Jenna [name] is having her first child ([name] only grandchild) Nov 30th but expected to likely deliver earlier.   All questions/concerns addressed. Emotional support provided.   Exam: Alert, mostly oriented. No distress. Breathing regular, unlabored. Abd soft. Moves all extremities.   Plan: - Continue full scope treatment, full code.  - Hopeful to avoid dialysis but wish to pursue all indicated interventions/treatments.   [place], NP Palliative Medicine Team Pager [phone] (Please see [email or web address] for schedule) Team Phone  [phone]    Greater than 50%  of this time was spent counseling and coordinating care related to the above assessment and plan

## 2020-11-20 LAB — CBC
HCT: 33.1 % — ABNORMAL LOW (ref 36.0–46.0)
Hemoglobin: 10.8 g/dL — ABNORMAL LOW (ref 12.0–15.0)
MCH: 29.1 pg (ref 26.0–34.0)
MCHC: 32.6 g/dL (ref 30.0–36.0)
MCV: 89.2 fL (ref 80.0–100.0)
Platelets: 67 10*3/uL — ABNORMAL LOW (ref 150–400)
RBC: 3.71 MIL/uL — ABNORMAL LOW (ref 3.87–5.11)
RDW: 20.9 % — ABNORMAL HIGH (ref 11.5–15.5)
WBC: 1.6 10*3/uL — ABNORMAL LOW (ref 4.0–10.5)
nRBC: 3.9 % — ABNORMAL HIGH (ref 0.0–0.2)

## 2020-11-20 LAB — RENAL FUNCTION PANEL
Albumin: 3 g/dL — ABNORMAL LOW (ref 3.5–5.0)
Anion gap: 12 (ref 5–15)
BUN: 105 mg/dL — ABNORMAL HIGH (ref 8–23)
CO2: 17 mmol/L — ABNORMAL LOW (ref 22–32)
Calcium: 9.1 mg/dL (ref 8.9–10.3)
Chloride: 102 mmol/L (ref 98–111)
Creatinine, Ser: 5.02 mg/dL — ABNORMAL HIGH (ref 0.44–1.00)
GFR, Estimated: 9 mL/min — ABNORMAL LOW (ref >60)
Glucose, Bld: 89 mg/dL (ref 70–99)
Phosphorus: 5.6 mg/dL — ABNORMAL HIGH (ref 2.5–4.6)
Potassium: 5 mmol/L (ref 3.5–5.1)
Sodium: 131 mmol/L — ABNORMAL LOW (ref 135–145)

## 2020-11-20 NOTE — Progress Notes (Signed)
Patient ID: Jenna [name], female   DOB: Jan 03, 1956, 65 y.o.   MRN: [id number] [facility] Progress Note   Assessment/ Plan:   Chronic kidney disease stage IV T: continue with tacrolimus and prednisone for kidney transplant immunosuppression and awaiting tacrolimus trough level after recent decrease in dose last week. Received IV lasix x 48hrs on admit, then switched to torsemide. Torsemide dc'd 10/4 due to rising creat. Creat up again today. Pt is poorly responsive. Family does not want dialysis and I agree that she would not tolerate dialysis well given all her other comorbidities. Today her BP's are below 80 systolic and she is poorly responsive, possibly moribund.  Have d/w pmd. No plans for dialysis. Will sign off.  Pneumonia: Suspected aspiration versus community-acquired, on azithromycin and ceftriaxone at this time. Acute exacerbation of systolic heart failure: CXR w/o edema but large R effusion/infiltrate.  HTN - BP's in the 70's today, pt poorly responsive History of cytomegalovirus viremia: Continue valganciclovir with renal adjustment Anemia of chronic disease: Without overt blood loss and stable hemoglobin and hematocrit overnight.  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   Kelly Splinter, MD 11/20/2020, 2:21 PM     Subjective:   No c/o today, onnasal O2 at 2 L /min   Objective:   BP (!) 146/85 (BP Location: Left Wrist)   Pulse 61   Temp 98.2 F (36.8 C)   Resp 16   Ht $R'5\' 7"'Yz$  (1.702 m)   Wt 96.8 kg   SpO2 96%   BMI 33.42 kg/m   Intake/Output Summary (Last 24 hours) at 11/20/2020 1421 Last data filed at 11/20/2020  0600 Gross per 24 hour  Intake 818.1 ml  Output --  Net 818.1 ml    Weight change: -0.1 kg  Physical Exam: Gen: Appears chronically ill, uncomfortable resting up in recliner CVS: Pulse regular rhythm, normal rate, S1 and S2 with ejection systolic murmur Resp: Poor inspiratory effort with decreased breath sounds over bases Abd: Soft, obese, nontender, bowel sounds normal Ext: 1-2+ bilateral lower extremity edema, left BCF.  Decreasing right upper extremity edema swelling.  Imaging: US Renal Transplant w/Doppler  Result Date: 11/19/2020 CLINICAL DATA:  Acute kidney injury EXAM: ULTRASOUND OF RENAL TRANSPLANT WITH RENAL DOPPLER ULTRASOUND TECHNIQUE: Ultrasound examination of the renal transplant was performed with gray-scale, color and duplex doppler evaluation. COMPARISON:  11/03/2020 FINDINGS: Transplant kidney location: Right lower quadrant Transplant Kidney: Renal measurements: 10.7 x 5.1 x 5.8 cm = volume: 157mL. Normal in size and parenchymal echogenicity. No evidence of mass or hydronephrosis. No peri-transplant fluid collection seen. Color flow in the main renal artery:  Yes Color flow in the main renal vein:  Yes Duplex Doppler Evaluation: Main Renal Artery Velocity: 87 cm/sec Main Renal Artery Resistive Index: 0.87 Venous waveform in main renal vein:  Present Intrarenal resistive index in upper pole:  0.79 (normal 0.6-0.8; equivocal 0.8-0.9; abnormal >= 0.9) Intrarenal resistive index in lower pole: 0.80 (normal 0.6-0.8; equivocal 0.8-0.9; abnormal >= 0.9) Bladder: Minimally dilated. No significant abnormality of the bladder. Other findings:  None. IMPRESSION: 1. Intra-renal resistive indices within normal limits. 2. Main transplant renal artery and renal vein are patent. 3. Normal grayscale appearance of the transplant kidney. Electronically Signed   By: Sharen Heck  Mir  M.D.   On: 11/19/2020 08:17    Labs: BMET Recent Labs  Lab 11/14/20 5247 11/15/20 9980 11/16/20 0123 11/17/20 0339  11/17/20 1925 11/18/20 0355 11/19/20 0355 11/20/20 0806  NA 137 134* 141 134* 136 138 133* 131*  K 4.3 4.5 4.5 4.4 4.4 4.4 4.8 5.0  CL 104 103 107 105 105 108 101 102  CO2 18* 19* 18* 19* 20* 20* 19* 17*  GLUCOSE 79 81 88 105* 100* 84 101* 89  BUN 85* 90* 97* 97* 99* 96* 97* 105*  CREATININE 3.54* 3.59* 3.95* 3.85* 4.00* 4.48* 4.64* 5.02*  CALCIUM 9.8 9.8 10.0 9.0 9.2 9.3 9.4 9.1  PHOS 5.6* 5.9*  --  5.1*  --  6.3* 5.5* 5.6*    CBC Recent Labs  Lab 11/14/20 0548 11/15/20 0437 11/17/20 0339 11/19/20 0652 11/20/20 0806  WBC 1.4* 1.5* 1.4* 2.0* 1.6*  NEUTROABS 0.9* 0.8* 0.6* 0.8*  --   HGB 11.9* 12.0 10.6* 10.9* 10.8*  HCT 37.1 37.8 32.4* 34.4* 33.1*  MCV 93.0 91.5 89.0 91.5 89.2  PLT 71* 66* 69* 69* 67*     Medications:     acetaminophen  1,000 mg Oral TID   aspirin EC  81 mg Oral q morning   citalopram  10 mg Oral Daily   feeding supplement (NEPRO CARB STEADY)  237 mL Oral TID BM   pantoprazole  40 mg Oral BID   predniSONE  5 mg Oral Daily   rosuvastatin  5 mg Oral q1800   sodium bicarbonate  650 mg Oral TID   sodium chloride flush  3 mL Intravenous Q12H   tacrolimus  0.5 mg Oral BID   valGANciclovir  450 mg Oral QODAY

## 2020-11-20 NOTE — Progress Notes (Signed)
PROGRESS NOTE    Jenna [name]  GMW:[id number] DOB: April 19, 1955 DOA: 11/13/2020 PCP: [name], Betty G, MD   Brief Narrative:  Jenna [name] is a 65 y.o. female with medical history significant of anemia, CAD, depression, chronic kidney disease stage IV, status post renal transplant on immunosuppressants, hypertension, focal segmental glomerularsclerosis, systolic CHF EF 44-03, lupus nephritis, ischemic cardiomyopathy, history of LA thrombus, obesity, paroxysmal atrial fibrillation, CVA of parietal lobe, known CAD status post DES to LAD in September 2020.  She was admitted earlier this month for acute kidney injury, pyelonephritis, Pseudomonas UTI, melanotic stools and acute systolic CHF.  Also was noted to have large right-sided pleural effusion.  She was stabilized and then discharged to a skilled nursing facility on 9/28.  Patient presented with complaints of bilateral leg pain and swelling, abdominal pain nausea chest pain.  She felt that she had not really improved.  Assessment & Plan:  Goals of care -Palliative care continues to follow, appreciate insight and recommendations  -Unfortunately at this time it appears patient's kidney function continues to deteriorate despite supportive care.  Discussed at length with patient's daughter and primary decision maker Jenna [name] about her general decline.  We discussed possible need for transient dialysis but do not think she would be a suitable candidate for chronic outpatient dialysis given baseline worsening cardiac dysfunction and renal dysfunction in the setting of advanced cardiorenal syndrome. After lengthy discussion with patient and Jenna [name] it appears they do not want to attempt supportive dialysis as they do not believe she would tolerate it and agree she is not appropriate for outpatient dialysis, this is certainly reasonable.  Tentative plan at this point is to follow patient clinically over the weekend, if creatinine and mental status continued  to deteriorate will advance forward with palliative care, if she improves we will follow along and discuss disposition and outpatient follow-up thereafter.    Acute on chronic systolic CHF, stabilizing - EF is known to be about 20 to 25%.   - Followed by [facility] - started on IV diuretics.  Cardiology was consulted initially with worsening creatinine - diuretics now off (see below) -Continue off all antihypertensives given hypotension over the past week  Acute on chronic kidney disease stage IV/history of renal transplant -Baseline creatinine 3 -3.5  -Creatinine continues to climb despite supportive care off nephrotoxic medications -Nephrology following, appreciate insight and recommendations -discussed possible supportive dialysis as a short-term basis option only but given discussion with family as above we will hold off on permacath placement -Previous CT of the abdomen did not show any abnormalities of the renal transplant, renal ultrasound shows main transplant renal artery and vein are patent with normal grayscale appearance - On valganciclovir for history of CMV.   Pneumonia, aspiration versus community-acquired, resolved -Completed antibiotic therapy -Patient continues to sat well into the 90s on room air, oxygen discontinued, reminded staff oxygen is to be used only for hypoxia not symptom management.   Epigastric abdominal pain, resolved -Abdomen benign today, previous imaging unremarkable  Acute metabolic encephalopathy, ongoing - Likely multifactorial given above infection, worsening creatinine/BUN  - High risk for sundowning given age and comorbidities  - Previous imaging of the head and brain unremarkable   History of paroxysmal atrial fibrillation, rate controlled -Transient bradycardia, beta-blockers held in the interim, will resume once indicated but heart rate remains well controlled in the 60s and 70s off metoprolol  - Home medication list does not show that she  is on anticoagulation.  Noted to  be just on aspirin.  Looks like she was on Eliquis previously but was not continued due to concern for GI bleed.   Pancytopenia -Chronic, remained stable -Likely secondary to SLE/CKD/chronic disease, She is on hydroxychloroquine and tacrolimus both of which can cause myelosuppression  History of SLE with history of lupus nephritis - On Plaquenil which was being continued.  Minimal to no improvement in pancytopenia  Hypertension in the setting of chronic kidney disease stage IV  -Off medications given above AKI and bradycardia  History of coronary artery disease Followed at [facility].  Continue aspirin.  Continue statin  Recent Pseudomonas UTI/pyelonephritis involving transplanted kidney Recently completed 7-day course of IV antibiotics with meropenem.   Obesity Estimated body mass index is 33.42 kg/m as calculated from the following:   Height as of this encounter: $RemoveBeforeD'5\' 7"'PitVVODleUXFMf$  (1.702 m).   Weight as of this encounter: 96.8 kg.  DVT prophylaxis: SCDs Code Status: Full Family Communication: None available  Status is: Inpatient  Dispo: The patient is from: Home              Anticipated d/c is to: To be determined              Anticipated d/c date is: 48 to 72 hours              Patient currently not medically stable for discharge  Consultants:  Nephrology  Procedures:  None  Antimicrobials:  Completed as above  Subjective: No acute issues, events noted overnight.  Appears to be somewhat more awake and alert today able to carry on a meaningful conversation with myself and daughter reviewed over the phone at bedside.  She understands that if her kidney function continues to worsen that she would not be a candidate for dialysis and is agreeable but remains hopeful.  Otherwise denies nausea vomiting diarrhea constipation headache fevers chills or chest pain  Objective: Vitals:   11/19/20 1333 11/19/20 2041 11/20/20 0500 11/20/20 0502  BP:  121/83 132/78  135/81  Pulse: 78 69  67  Resp: (!) $RemoveB'22 16  16  'NcjbkFZP$ Temp: 97.6 F (36.4 C) 97.7 F (36.5 C)  98.2 F (36.8 C)  TempSrc: Oral Oral    SpO2:  98%  96%  Weight:   96.8 kg   Height:        Intake/Output Summary (Last 24 hours) at 11/20/2020 0754 Last data filed at 11/20/2020 0600 Gross per 24 hour  Intake 818.1 ml  Output --  Net 818.1 ml    Filed Weights   11/18/20 0500 11/19/20 0500 11/20/20 0500  Weight: 96.8 kg 96.9 kg 96.8 kg    Examination:  General exam: Appears calm and comfortable, oriented to person place and general situation Respiratory system: Clear to auscultation. Respiratory effort normal. Cardiovascular system: S1 & S2 heard, RRR. No JVD, murmurs, rubs, gallops or clicks. No pedal edema. Gastrointestinal system: Abdomen is nondistended, soft and nontender. No organomegaly or masses felt. Normal bowel sounds heard. Central nervous system: Alert and oriented. No focal neurological deficits. Extremities: Symmetric 5 x 5 power. Skin: No rashes, lesions or ulcers Psychiatry: Judgement and insight appear normal. Mood & affect appropriate.   Data Reviewed: I have personally reviewed following labs and imaging studies  CBC: Recent Labs  Lab 11/13/20 1442 11/13/20 1623 11/14/20 0548 11/15/20 0437 11/17/20 0339 11/19/20 0652  WBC 1.8*  --  1.4* 1.5* 1.4* 2.0*  NEUTROABS 1.0*  --  0.9* 0.8* 0.6* 0.8*  HGB 11.7* 13.9 11.9* 12.0  10.6* 10.9*  HCT 36.2 41.0 37.1 37.8 32.4* 34.4*  MCV 91.4  --  93.0 91.5 89.0 91.5  PLT 86*  --  71* 66* 69* 69*    Basic Metabolic Panel: Recent Labs  Lab 11/14/20 0548 11/15/20 0437 11/16/20 0704 11/17/20 0339 11/17/20 1925 11/18/20 0355 11/19/20 0355  NA 137 134* 141 134* 136 138 133*  K 4.3 4.5 4.5 4.4 4.4 4.4 4.8  CL 104 103 107 105 105 108 101  CO2 18* 19* 18* 19* 20* 20* 19*  GLUCOSE 79 81 88 105* 100* 84 101*  BUN 85* 90* 97* 97* 99* 96* 97*  CREATININE 3.54* 3.59* 3.95* 3.85* 4.00* 4.48* 4.64*  CALCIUM  9.8 9.8 10.0 9.0 9.2 9.3 9.4  MG 2.6* 2.5*  --   --   --   --   --   PHOS 5.6* 5.9*  --  5.1*  --  6.3* 5.5*    GFR: Estimated Creatinine Clearance: 14.4 mL/min (A) (by C-G formula based on SCr of 4.64 mg/dL (H)). Liver Function Tests: Recent Labs  Lab 11/13/20 1442 11/13/20 1612 11/14/20 0548 11/15/20 0437 11/17/20 0339 11/17/20 1925 11/18/20 0355 11/19/20 0355  AST SPECIMEN HEMOLYZED. HEMOLYSIS MAY AFFECT INTEGRITY OF RESULTS. 32 27  --   --  28 27  --   ALT SPECIMEN HEMOLYZED. HEMOLYSIS MAY AFFECT INTEGRITY OF RESULTS. 18 16  --   --  12 11  --   ALKPHOS SPECIMEN HEMOLYZED. HEMOLYSIS MAY AFFECT INTEGRITY OF RESULTS. 139* 125  --   --  96 87  --   BILITOT SPECIMEN HEMOLYZED. HEMOLYSIS MAY AFFECT INTEGRITY OF RESULTS. 1.5* 1.4*  --   --  1.3* 1.2  --   PROT SPECIMEN HEMOLYZED. HEMOLYSIS MAY AFFECT INTEGRITY OF RESULTS. 7.6 6.9  --   --  6.3* 6.0*  --   ALBUMIN SPECIMEN HEMOLYZED. HEMOLYSIS MAY AFFECT INTEGRITY OF RESULTS. 3.5 3.2* 3.3* 3.1* 3.0* 2.9*  3.0* 3.0*    Recent Labs  Lab 11/13/20 1612 11/17/20 1925 11/18/20 0355  LIPASE 22 43 34    No results for input(s): AMMONIA in the last 168 hours. Coagulation Profile: No results for input(s): INR, PROTIME in the last 168 hours. Cardiac Enzymes: Recent Labs  Lab 11/14/20 0548  CKTOTAL 55    BNP (last 3 results) No results for input(s): PROBNP in the last 8760 hours. HbA1C: No results for input(s): HGBA1C in the last 72 hours. CBG: No results for input(s): GLUCAP in the last 168 hours. Lipid Profile: No results for input(s): CHOL, HDL, LDLCALC, TRIG, CHOLHDL, LDLDIRECT in the last 72 hours. Thyroid Function Tests: No results for input(s): TSH, T4TOTAL, FREET4, T3FREE, THYROIDAB in the last 72 hours. Anemia Panel: Recent Labs    11/19/20 0355 11/19/20 0652  VITAMINB12 1,202*  --   FOLATE 17.1  --   FERRITIN 267  --   TIBC 229*  --   IRON 26*  --   RETICCTPCT  --  2.3    Sepsis Labs: Recent Labs  Lab  11/14/20 0548 11/15/20 0437  PROCALCITON 0.31 0.45  LATICACIDVEN 2.3*  --      Recent Results (from the past 240 hour(s))  SARS CORONAVIRUS 2 (TAT 6-24 HRS) Nasopharyngeal Nasopharyngeal Swab     Status: None   Collection Time: 11/11/20 12:51 PM   Specimen: Nasopharyngeal Swab  Result Value Ref Range Status   SARS Coronavirus 2 NEGATIVE NEGATIVE Final    Comment: (NOTE) SARS-CoV-2 target nucleic acids are NOT DETECTED.  The SARS-CoV-2 RNA is generally detectable in upper and lower respiratory specimens during the acute phase of infection. Negative results do not preclude SARS-CoV-2 infection, do not rule out co-infections with other pathogens, and should not be used as the sole basis for treatment or other patient management decisions. Negative results must be combined with clinical observations, patient history, and epidemiological information. The expected result is Negative.  Fact Sheet for Patients: SugarRoll.be  Fact Sheet for Healthcare Providers: https://www.woods-mathews.com/  This test is not yet approved or cleared by the [place] FDA and  has been authorized for detection and/or diagnosis of SARS-CoV-2 by FDA under an Emergency Use Authorization (EUA). This EUA will remain  in effect (meaning this test can be used) for the duration of the COVID-19 declaration under Se ction 564(b)(1) of the Act, 21 U.S.C. section 360bbb-3(b)(1), unless the authorization is terminated or revoked sooner.  Performed at [facility], [place] [address] [place], [place] [address]   Resp Panel by RT-PCR (Flu A&B, Covid) Nasopharyngeal Swab     Status: None   Collection Time: 11/13/20  5:49 PM   Specimen: Nasopharyngeal Swab; Nasopharyngeal(NP) swabs in vial transport medium  Result Value Ref Range Status   SARS Coronavirus 2 by RT PCR NEGATIVE NEGATIVE Final    Comment: (NOTE) SARS-CoV-2 target nucleic acids are NOT DETECTED.  The  SARS-CoV-2 RNA is generally detectable in upper respiratory specimens during the acute phase of infection. The lowest concentration of SARS-CoV-2 viral copies this assay can detect is 138 copies/mL. A negative result does not preclude SARS-Cov-2 infection and should not be used as the sole basis for treatment or other patient management decisions. A negative result may occur with  improper specimen collection/handling, submission of specimen other than nasopharyngeal swab, presence of viral mutation(s) within the areas targeted by this assay, and inadequate number of viral copies(<138 copies/mL). A negative result must be combined with clinical observations, patient history, and epidemiological information. The expected result is Negative.  Fact Sheet for Patients:  EntrepreneurPulse.com.au  Fact Sheet for Healthcare Providers:  IncredibleEmployment.be  This test is no t yet approved or cleared by the [place] FDA and  has been authorized for detection and/or diagnosis of SARS-CoV-2 by FDA under an Emergency Use Authorization (EUA). This EUA will remain  in effect (meaning this test can be used) for the duration of the COVID-19 declaration under Section 564(b)(1) of the Act, 21 U.S.C.section 360bbb-3(b)(1), unless the authorization is terminated  or revoked sooner.       Influenza A by PCR NEGATIVE NEGATIVE Final   Influenza B by PCR NEGATIVE NEGATIVE Final    Comment: (NOTE) The Xpert Xpress SARS-CoV-2/FLU/RSV plus assay is intended as an aid in the diagnosis of influenza from Nasopharyngeal swab specimens and should not be used as a sole basis for treatment. Nasal washings and aspirates are unacceptable for Xpert Xpress SARS-CoV-2/FLU/RSV testing.  Fact Sheet for Patients: EntrepreneurPulse.com.au  Fact Sheet for Healthcare Providers: IncredibleEmployment.be  This test is not yet approved or  cleared by the [place] FDA and has been authorized for detection and/or diagnosis of SARS-CoV-2 by FDA under an Emergency Use Authorization (EUA). This EUA will remain in effect (meaning this test can be used) for the duration of the COVID-19 declaration under Section 564(b)(1) of the Act, 21 U.S.C. section 360bbb-3(b)(1), unless the authorization is terminated or revoked.  Performed at [facility], [place] [address], [place], [place] [address]   Culture, blood (Routine X 2)  w Reflex to ID Panel     Status: None   Collection Time: 11/14/20  7:11 AM   Specimen: BLOOD RIGHT HAND  Result Value Ref Range Status   Specimen Description   Final    BLOOD RIGHT HAND Performed at [facility] [address], [place], [place] [address]    Special Requests   Final    BOTTLES DRAWN AEROBIC ONLY Blood Culture results may not be optimal due to an inadequate volume of blood received in culture bottles Performed at [facility] [address], [place], [place] [address]    Culture   Final    NO GROWTH 5 DAYS Performed at [facility], [place] [address], [place], [place] [address]    Report Status 11/19/2020 FINAL  Final  MRSA Next Gen by PCR, Nasal     Status: None   Collection Time: 11/14/20  6:00 PM   Specimen: Nasal Mucosa; Nasal Swab  Result Value Ref Range Status   MRSA by PCR Next Gen NOT DETECTED NOT DETECTED Final    Comment: RESULT CALLED TO, READ BACK BY AND VERIFIED WITH: SCOTPON J $RemoveB'@0535'[id number]$  BY BATTLET Performed at [facility] [address] [place], [place] [address]           Radiology Studies: US Renal Transplant w/Doppler  Result Date: 11/19/2020 CLINICAL DATA:  Acute kidney injury EXAM: ULTRASOUND OF RENAL TRANSPLANT WITH RENAL DOPPLER ULTRASOUND TECHNIQUE: Ultrasound examination of the renal transplant was performed with gray-scale, color and duplex doppler evaluation. COMPARISON:   11/03/2020 FINDINGS: Transplant kidney location: Right lower quadrant Transplant Kidney: Renal measurements: 10.7 x 5.1 x 5.8 cm = volume: 157mL. Normal in size and parenchymal echogenicity. No evidence of mass or hydronephrosis. No peri-transplant fluid collection seen. Color flow in the main renal artery:  Yes Color flow in the main renal vein:  Yes Duplex Doppler Evaluation: Main Renal Artery Velocity: 87 cm/sec Main Renal Artery Resistive Index: 0.87 Venous waveform in main renal vein:  Present Intrarenal resistive index in upper pole:  0.79 (normal 0.6-0.8; equivocal 0.8-0.9; abnormal >= 0.9) Intrarenal resistive index in lower pole: 0.80 (normal 0.6-0.8; equivocal 0.8-0.9; abnormal >= 0.9) Bladder: Minimally dilated. No significant abnormality of the bladder. Other findings:  None. IMPRESSION: 1. Intra-renal resistive indices within normal limits. 2. Main transplant renal artery and renal vein are patent. 3. Normal grayscale appearance of the transplant kidney. Electronically Signed   By: Miachel Roux M.D.   On: 11/19/2020 08:17    Scheduled Meds:  acetaminophen  1,000 mg Oral TID   aspirin EC  81 mg Oral q morning   citalopram  10 mg Oral Daily   feeding supplement (NEPRO CARB STEADY)  237 mL Oral TID BM   pantoprazole  40 mg Oral BID   predniSONE  5 mg Oral Daily   rosuvastatin  5 mg Oral q1800   sodium bicarbonate  650 mg Oral TID   sodium chloride flush  3 mL Intravenous Q12H   tacrolimus  0.5 mg Oral BID   valGANciclovir  450 mg Oral QODAY   Continuous Infusions:  sodium chloride     sodium chloride 50 mL/hr at 11/19/20 1305     LOS: 6 days   Time spent: 61min  Semisi Biela C Jenella Craigie, DO [facility]  If 7PM-7AM, please contact night-coverage [email or web address]  11/20/2020, 7:54 AM

## 2020-11-21 LAB — RENAL FUNCTION PANEL
Albumin: 3.4 g/dL — ABNORMAL LOW (ref 3.5–5.0)
Anion gap: 15 (ref 5–15)
BUN: 109 mg/dL — ABNORMAL HIGH (ref 8–23)
CO2: 20 mmol/L — ABNORMAL LOW (ref 22–32)
Calcium: 10 mg/dL (ref 8.9–10.3)
Chloride: 107 mmol/L (ref 98–111)
Creatinine, Ser: 5.22 mg/dL — ABNORMAL HIGH (ref 0.44–1.00)
GFR, Estimated: 9 mL/min — ABNORMAL LOW (ref >60)
Glucose, Bld: 80 mg/dL (ref 70–99)
Phosphorus: 6.4 mg/dL — ABNORMAL HIGH (ref 2.5–4.6)
Potassium: 5.6 mmol/L — ABNORMAL HIGH (ref 3.5–5.1)
Sodium: 142 mmol/L (ref 135–145)

## 2020-11-21 MED ORDER — SODIUM ZIRCONIUM CYCLOSILICATE 5 G PO PACK
5.0000 g | PACK | Freq: Once | ORAL | Status: AC
  Administered 2020-11-21: 5 g via ORAL
  Filled 2020-11-21: qty 1

## 2020-11-21 NOTE — Progress Notes (Signed)
OT Cancellation Note  Patient Details Name: Jenna [name] MRN: [id number] DOB: 10/21/1955   Cancelled Treatment:    Reason Eval/Treat Not Completed: Other (comment) (Attempted to see pt at two different times, family member declined reporting she did not want to wake her up. Will follow up as able.)  Jackquline Denmark, OTS Acute Rehab Office: [phone]   Kristina Bertone 11/21/2020, 3:36 PM

## 2020-11-21 NOTE — Progress Notes (Addendum)
PROGRESS NOTE    Jenna [name]  GCR:[id number] DOB: Jul 10, 1955 DOA: 11/13/2020 PCP: [place], Betty G, MD   Brief Narrative:  Jenna [name] is a 65 y.o. female with medical history significant of anemia, CAD, depression, chronic kidney disease stage IV, status post renal transplant on immunosuppressants, hypertension, focal segmental glomerularsclerosis, systolic CHF EF 20-25, lupus nephritis, ischemic cardiomyopathy, history of LA thrombus, obesity, paroxysmal atrial fibrillation, CVA of parietal lobe, known CAD status post DES to LAD in September 2020.  She was admitted earlier this month for acute kidney injury, pyelonephritis, Pseudomonas UTI, melanotic stools and acute systolic CHF.  Also was noted to have large right-sided pleural effusion.  She was stabilized and then discharged to a skilled nursing facility on 9/28.  Patient presented with complaints of bilateral leg pain and swelling, abdominal pain nausea chest pain.  She felt that she had not really improved.  Assessment & Plan:  Goals of care -Palliative care continues to follow, appreciate insight and recommendations  -Unfortunately at this time it appears patient's kidney function continues to deteriorate despite supportive care.  Discussed at length with patient's daughter and primary decision maker Jenna [name] about her general decline.  We discussed possible need for transient dialysis but do not think she would be a suitable candidate for chronic outpatient dialysis given baseline worsening cardiac dysfunction and renal dysfunction in the setting of advanced cardiorenal syndrome. After lengthy discussion with patient and Jenna [name] it appears they do not want to attempt supportive dialysis as they do not believe she would tolerate it and agree she is not appropriate for outpatient dialysis, this is certainly reasonable.  Tentative plan at this point is to follow patient clinically over the weekend, if creatinine and mental status continued  to deteriorate will advance forward with palliative care, if she improves we will follow along and discuss disposition and outpatient follow-up thereafter.    Acute on chronic systolic CHF, stabilizing - EF is known to be about 20 to 25%.   - [facility] - started on IV diuretics.  Cardiology was consulted initially with worsening creatinine - diuretics now off (see below) -Continue off all antihypertensives given hypotension over the past week  Acute on chronic kidney disease stage IV/history of renal transplant -Baseline creatinine 3 -3.5  -Creatinine continues to climb despite supportive care off nephrotoxic medications -Nephrology signing off given no indication for dialysis given discussion with family and patient. -Previous CT of the abdomen did not show any abnormalities of the renal transplant, renal ultrasound shows main transplant renal artery and vein are patent with normal grayscale appearance - On valganciclovir for history of CMV.   Pneumonia, aspiration versus community-acquired, resolved -Completed antibiotic therapy -Patient continues to sat well into the 90s on room air, oxygen discontinued, reminded staff oxygen is to be used only for hypoxia not symptom management.   Epigastric abdominal pain, resolved -Abdomen benign today, previous imaging unremarkable  Acute metabolic encephalopathy, ongoing - Likely multifactorial given above infection, worsening creatinine/BUN  - High risk for sundowning given age and comorbidities  - Previous imaging of the head and brain unremarkable   History of paroxysmal atrial fibrillation, rate controlled -Transient bradycardia, beta-blockers held in the interim, will resume once indicated but heart rate remains well controlled in the 60s and 70s off metoprolol  - Home medication list does not show that she is on anticoagulation.  Noted to be just on aspirin.  Looks like she was on Eliquis previously but was not continued  due to concern for GI bleed.   Pancytopenia -Chronic, remained stable -Likely secondary to SLE/CKD/chronic disease, She is on hydroxychloroquine and tacrolimus both of which can cause myelosuppression  History of SLE with history of lupus nephritis - On Plaquenil which was being continued.  Minimal to no improvement in pancytopenia  Hypertension in the setting of chronic kidney disease stage IV  -Off medications given above AKI and bradycardia  History of coronary artery disease Followed at [facility].  Continue aspirin.  Continue statin  Recent Pseudomonas UTI/pyelonephritis involving transplanted kidney Recently completed 7-day course of IV antibiotics with meropenem.   Obesity Estimated body mass index is 33.42 kg/m as calculated from the following:   Height as of this encounter: $RemoveBeforeD'5\' 7"'ctGiiPBFwjLpXb$  (1.702 m).   Weight as of this encounter: 96.8 kg.  DVT prophylaxis: SCDs Code Status: Full Family Communication: None available  Status is: Inpatient  Dispo: The patient is from: Home              Anticipated d/c is to: To be determined              Anticipated d/c date is: 48 to 72 hours              Patient currently not medically stable for discharge  Consultants:  Nephrology  Procedures:  None  Antimicrobials:  Completed as above  Subjective: No acute issues, events noted overnight.  Appears to be somewhat more awake and alert today able to carry on a meaningful conversation with myself and daughter reviewed over the phone at bedside.  She understands that if her kidney function continues to worsen that she would not be a candidate for dialysis and is agreeable but remains hopeful.  Otherwise denies nausea vomiting diarrhea constipation headache fevers chills or chest pain  Objective: Vitals:   11/20/20 1316 11/20/20 2003 11/21/20 0400 11/21/20 0515  BP: (!) 146/85 (!) 152/85 (!) 156/104 (!) 155/96  Pulse: 61 72 71   Resp: $Remo'16 17 18   'xkfoJ$ Temp:  97.8 F (36.6 C) (!) 97.4 F  (36.3 C)   TempSrc:  Oral Oral   SpO2: 96% 98% 96%   Weight:    93.9 kg  Height:        Intake/Output Summary (Last 24 hours) at 11/21/2020 0801 Last data filed at 11/21/2020 0700 Gross per 24 hour  Intake 1291.49 ml  Output --  Net 1291.49 ml    Filed Weights   11/19/20 0500 11/20/20 0500 11/21/20 0515  Weight: 96.9 kg 96.8 kg 93.9 kg    Examination:  General exam: Appears calm and comfortable, oriented to person place and general situation Respiratory system: Clear to auscultation. Respiratory effort normal. Cardiovascular system: S1 & S2 heard, RRR. No JVD, murmurs, rubs, gallops or clicks. No pedal edema. Gastrointestinal system: Abdomen is nondistended, soft and nontender. No organomegaly or masses felt. Normal bowel sounds heard. Central nervous system: Alert and oriented. No focal neurological deficits. Extremities: Symmetric 5 x 5 power. Skin: No rashes, lesions or ulcers Psychiatry: Judgement and insight appear normal. Mood & affect appropriate.   Data Reviewed: I have personally reviewed following labs and imaging studies  CBC: Recent Labs  Lab 11/15/20 0437 11/17/20 0339 11/19/20 0652 11/20/20 0806  WBC 1.5* 1.4* 2.0* 1.6*  NEUTROABS 0.8* 0.6* 0.8*  --   HGB 12.0 10.6* 10.9* 10.8*  HCT 37.8 32.4* 34.4* 33.1*  MCV 91.5 89.0 91.5 89.2  PLT 66* 69* 69* 67*    Basic Metabolic Panel: Recent  Labs  Lab 11/15/20 0437 11/16/20 0704 11/17/20 0339 11/17/20 1925 11/18/20 0355 11/19/20 0355 11/20/20 0806  NA 134*   < > 134* 136 138 133* 131*  K 4.5   < > 4.4 4.4 4.4 4.8 5.0  CL 103   < > 105 105 108 101 102  CO2 19*   < > 19* 20* 20* 19* 17*  GLUCOSE 81   < > 105* 100* 84 101* 89  BUN 90*   < > 97* 99* 96* 97* 105*  CREATININE 3.59*   < > 3.85* 4.00* 4.48* 4.64* 5.02*  CALCIUM 9.8   < > 9.0 9.2 9.3 9.4 9.1  MG 2.5*  --   --   --   --   --   --   PHOS 5.9*  --  5.1*  --  6.3* 5.5* 5.6*   < > = values in this interval not displayed.    GFR: Estimated  Creatinine Clearance: 13.1 mL/min (A) (by C-G formula based on SCr of 5.02 mg/dL (H)). Liver Function Tests: Recent Labs  Lab 11/17/20 0339 11/17/20 1925 11/18/20 0355 11/19/20 0355 11/20/20 0806  AST  --  28 27  --   --   ALT  --  12 11  --   --   ALKPHOS  --  96 87  --   --   BILITOT  --  1.3* 1.2  --   --   PROT  --  6.3* 6.0*  --   --   ALBUMIN 3.1* 3.0* 2.9*  3.0* 3.0* 3.0*    Recent Labs  Lab 11/17/20 1925 11/18/20 0355  LIPASE 43 34    No results for input(s): AMMONIA in the last 168 hours. Coagulation Profile: No results for input(s): INR, PROTIME in the last 168 hours. Cardiac Enzymes: No results for input(s): CKTOTAL, CKMB, CKMBINDEX, TROPONINI in the last 168 hours.  BNP (last 3 results) No results for input(s): PROBNP in the last 8760 hours. HbA1C: No results for input(s): HGBA1C in the last 72 hours. CBG: No results for input(s): GLUCAP in the last 168 hours. Lipid Profile: No results for input(s): CHOL, HDL, LDLCALC, TRIG, CHOLHDL, LDLDIRECT in the last 72 hours. Thyroid Function Tests: No results for input(s): TSH, T4TOTAL, FREET4, T3FREE, THYROIDAB in the last 72 hours. Anemia Panel: Recent Labs    11/19/20 0355 11/19/20 0652  VITAMINB12 1,202*  --   FOLATE 17.1  --   FERRITIN 267  --   TIBC 229*  --   IRON 26*  --   RETICCTPCT  --  2.3    Sepsis Labs: Recent Labs  Lab 11/15/20 0437  PROCALCITON 0.45     Recent Results (from the past 240 hour(s))  SARS CORONAVIRUS 2 (TAT 6-24 HRS) Nasopharyngeal Nasopharyngeal Swab     Status: None   Collection Time: 11/11/20 12:51 PM   Specimen: Nasopharyngeal Swab  Result Value Ref Range Status   SARS Coronavirus 2 NEGATIVE NEGATIVE Final    Comment: (NOTE) SARS-CoV-2 target nucleic acids are NOT DETECTED.  The SARS-CoV-2 RNA is generally detectable in upper and lower respiratory specimens during the acute phase of infection. Negative results do not preclude SARS-CoV-2 infection, do not rule  out co-infections with other pathogens, and should not be used as the sole basis for treatment or other patient management decisions. Negative results must be combined with clinical observations, patient history, and epidemiological information. The expected result is Negative.  Fact Sheet for Patients: SugarRoll.be  Fact  Sheet for Healthcare Providers: https://www.woods-mathews.com/  This test is not yet approved or cleared by the [place] FDA and  has been authorized for detection and/or diagnosis of SARS-CoV-2 by FDA under an Emergency Use Authorization (EUA). This EUA will remain  in effect (meaning this test can be used) for the duration of the COVID-19 declaration under Se ction 564(b)(1) of the Act, 21 U.S.C. section 360bbb-3(b)(1), unless the authorization is terminated or revoked sooner.  Performed at [facility], [place] [address] [place], [place] [address]   Resp Panel by RT-PCR (Flu A&B, Covid) Nasopharyngeal Swab     Status: None   Collection Time: 11/13/20  5:49 PM   Specimen: Nasopharyngeal Swab; Nasopharyngeal(NP) swabs in vial transport medium  Result Value Ref Range Status   SARS Coronavirus 2 by RT PCR NEGATIVE NEGATIVE Final    Comment: (NOTE) SARS-CoV-2 target nucleic acids are NOT DETECTED.  The SARS-CoV-2 RNA is generally detectable in upper respiratory specimens during the acute phase of infection. The lowest concentration of SARS-CoV-2 viral copies this assay can detect is 138 copies/mL. A negative result does not preclude SARS-Cov-2 infection and should not be used as the sole basis for treatment or other patient management decisions. A negative result may occur with  improper specimen collection/handling, submission of specimen other than nasopharyngeal swab, presence of viral mutation(s) within the areas targeted by this assay, and inadequate number of viral copies(<138 copies/mL). A negative result  must be combined with clinical observations, patient history, and epidemiological information. The expected result is Negative.  Fact Sheet for Patients:  EntrepreneurPulse.com.au  Fact Sheet for Healthcare Providers:  IncredibleEmployment.be  This test is no t yet approved or cleared by the [place] FDA and  has been authorized for detection and/or diagnosis of SARS-CoV-2 by FDA under an Emergency Use Authorization (EUA). This EUA will remain  in effect (meaning this test can be used) for the duration of the COVID-19 declaration under Section 564(b)(1) of the Act, 21 U.S.C.section 360bbb-3(b)(1), unless the authorization is terminated  or revoked sooner.       Influenza A by PCR NEGATIVE NEGATIVE Final   Influenza B by PCR NEGATIVE NEGATIVE Final    Comment: (NOTE) The Xpert Xpress SARS-CoV-2/FLU/RSV plus assay is intended as an aid in the diagnosis of influenza from Nasopharyngeal swab specimens and should not be used as a sole basis for treatment. Nasal washings and aspirates are unacceptable for Xpert Xpress SARS-CoV-2/FLU/RSV testing.  Fact Sheet for Patients: EntrepreneurPulse.com.au  Fact Sheet for Healthcare Providers: IncredibleEmployment.be  This test is not yet approved or cleared by the [place] FDA and has been authorized for detection and/or diagnosis of SARS-CoV-2 by FDA under an Emergency Use Authorization (EUA). This EUA will remain in effect (meaning this test can be used) for the duration of the COVID-19 declaration under Section 564(b)(1) of the Act, 21 U.S.C. section 360bbb-3(b)(1), unless the authorization is terminated or revoked.  Performed at [facility], [place] [address], [place], [place] [address]   Culture, blood (Routine X 2) w Reflex to ID Panel     Status: None   Collection Time: 11/14/20  7:11 AM   Specimen: BLOOD RIGHT HAND  Result Value  Ref Range Status   Specimen Description   Final    BLOOD RIGHT HAND Performed at [facility] [address], [place], [place] [address]    Special Requests   Final    BOTTLES DRAWN AEROBIC ONLY Blood Culture results  may not be optimal due to an inadequate volume of blood received in culture bottles Performed at [facility] [address], [place], [place] [address]    Culture   Final    NO GROWTH 5 DAYS Performed at [facility], [place] [address] [place], [place] [address]    Report Status 11/19/2020 FINAL  Final  MRSA Next Gen by PCR, Nasal     Status: None   Collection Time: 11/14/20  6:00 PM   Specimen: Nasal Mucosa; Nasal Swab  Result Value Ref Range Status   MRSA by PCR Next Gen NOT DETECTED NOT DETECTED Final    Comment: RESULT CALLED TO, READ BACK BY AND VERIFIED WITH: SCOTPON J $RemoveB'@0535'msGIBVmB$  BY BATTLET Performed at [facility] [address], [place], [place] [address]           Radiology Studies: No results found.  Scheduled Meds:  acetaminophen  1,000 mg Oral TID   aspirin EC  81 mg Oral q morning   citalopram  10 mg Oral Daily   feeding supplement (NEPRO CARB STEADY)  237 mL Oral TID BM   pantoprazole  40 mg Oral BID   predniSONE  5 mg Oral Daily   rosuvastatin  5 mg Oral q1800   sodium bicarbonate  650 mg Oral TID   sodium chloride flush  3 mL Intravenous Q12H   tacrolimus  0.5 mg Oral BID   valGANciclovir  450 mg Oral QODAY   Continuous Infusions:  sodium chloride     sodium chloride 50 mL/hr at 11/21/20 0700     LOS: 7 days   Time spent: 28min  Noah Lembke C Rahmon Heigl, DO [facility]  If 7PM-7AM, please contact night-coverage [email or web address]  11/21/2020, 8:01 AM

## 2020-11-22 DIAGNOSIS — N289 Disorder of kidney and ureter, unspecified: Secondary | ICD-10-CM

## 2020-11-22 DIAGNOSIS — I509 Heart failure, unspecified: Secondary | ICD-10-CM

## 2020-11-22 LAB — CBC
HCT: 35.6 % — ABNORMAL LOW (ref 36.0–46.0)
Hemoglobin: 11.5 g/dL — ABNORMAL LOW (ref 12.0–15.0)
MCH: 29.2 pg (ref 26.0–34.0)
MCHC: 32.3 g/dL (ref 30.0–36.0)
MCV: 90.4 fL (ref 80.0–100.0)
Platelets: 81 10*3/uL — ABNORMAL LOW (ref 150–400)
RBC: 3.94 MIL/uL (ref 3.87–5.11)
RDW: 20.8 % — ABNORMAL HIGH (ref 11.5–15.5)
WBC: 1.7 10*3/uL — ABNORMAL LOW (ref 4.0–10.5)
nRBC: 0 % (ref 0.0–0.2)

## 2020-11-22 LAB — BASIC METABOLIC PANEL
Anion gap: 14 (ref 5–15)
BUN: 112 mg/dL — ABNORMAL HIGH (ref 8–23)
CO2: 17 mmol/L — ABNORMAL LOW (ref 22–32)
Calcium: 8.9 mg/dL (ref 8.9–10.3)
Chloride: 104 mmol/L (ref 98–111)
Creatinine, Ser: 5.25 mg/dL — ABNORMAL HIGH (ref 0.44–1.00)
GFR, Estimated: 9 mL/min — ABNORMAL LOW (ref >60)
Glucose, Bld: 109 mg/dL — ABNORMAL HIGH (ref 70–99)
Potassium: 4.7 mmol/L (ref 3.5–5.1)
Sodium: 135 mmol/L (ref 135–145)

## 2020-11-22 NOTE — Progress Notes (Signed)
Notified MD of 17 beat run vtach, patient is asymptomatic.

## 2020-11-22 NOTE — Progress Notes (Addendum)
Palliative Medicine Inpatient Follow Up Note     Chart Reviewed. Patient assessed at the bedside.   Patient initially sitting up in recliner. Awake and alert. No acute distress noted. Appears weak. Denies pain or shortness of breath. Taking a few bites of food from tray. States her appetite is minimal and food taste awful.   Patient's 2 brothers are at the bedside visiting from [place], [place]. Per family daughter has went home to get some rest.   I spoke at length with patient and her brothers. [name] confirms that she and her daughter are in mutual agreement and are not interested in dialysis as an option. [name] is hopeful she can return home soon.   With permission updates provided to her brothers.   PT at the bedside to assist patient back to bed from recliner. Patient is fatigued after transfer and states she has to sleep.   I spoke at length with daughter, Jenna [name] via phone. Daughter shares she is in the process of locating to [place] to be closer to her mother and hopefully deliver her child here. Daughter states she is concerned about future expectations and arrangements for her mom as she will be limited in ability to provide the care she would need given she is about to a be a new first time mother and with the living arrangements. Support provided. Daughter is requesting for me to speak with her uncles at the bedside and offer options for patient to reside in the home with them in [place]. Encouraged daughter to speak with family and have a discussion to make sure all family were understanding of the level of care patient will need and expectations in her health as it will continue to decline.   I provided education on expectations in the setting of no dialysis. We discussed continued hospital/facility care vs patient coming home with family and allowing nature to take it's course with hospice support.  Creating space and opportunity to discuss code status. Jenna [name] became upset  expressing she is 8 mos pregnant, hormonal and her concerns that I was recommending hospice for her mother. Shared the goal was not to upset her but to gain a clear understanding of her and patient's wishes to allow appropriate resources to be arranged. Daughter verbalized understanding. She expressed she was not in a place mentally to further discuss and provided permission to continue discussions with her uncle emphasizing request to gain an understanding of the amount of support they would be willing to offer.   I spoke to patient's brother again at the bedside. Patient's daughter called him to explain the goals of conversation and granting permission for him to discuss and ask whatever questions he needed. After detailed updates provided to brother, education on expectations, poor long-term prognosis, and end-of-life brother verbalized appreciation of discussion. He shares he is not in a position or would not be comfortable taking patient hours away from [place]. He was hopeful his sister who lives locally would be willing but is unsure. Brother states per daughter family was under impression patient would return to SNF for at least 2 weeks and family would have time to further discuss and arrange. He is very much open to providing whatever support his sister would need but again stating he is limited in ability to provide care and take her back to [place]. He is ambulatory with a cane himself.   Brother is realistic in his expectations sharing he knows his sister is seriously sick and that her  time left is limited. He does not want her to suffer and is hopeful she will be able to spend what time she has left amongst her family. He is in agreement with no dialysis as he knows this is not something she would want to start and speaks to the decline in her quality of life over the past year. Education provided on palliative and hospice support outpatient. Brother verbalizes understanding and feels  hospice would be appropriate however knows Jenna [name] will have to come to terms with her mother's decline.   Encouraged ongoing family discussions regarding home support and how family can pull together for patient. He verbalizes plans for family discussions with a goal of speaking with daughter at length later today and with his other siblings over the next 24 hours.   Discussed the importance of continued conversation with family and their  medical providers regarding overall plan of care and treatment options, ensuring decisions are within the context of the patients values and GOCs.   Questions addressed and support provided.    Objective Assessment: Vital Signs Vitals:   11/22/20 1923 11/22/20 2024  BP: (!) 152/100 (!) 145/91  Pulse: 81 69  Resp:  18  Temp:  98 F (36.7 C)  SpO2: 96% 97%    Intake/Output Summary (Last 24 hours) at 11/22/2020 2152 Last data filed at 11/22/2020 1621 Gross per 24 hour  Intake 1084.4 ml  Output 75 ml  Net 1009.4 ml   Last Weight  Most recent update: 11/22/2020  5:14 AM    Weight  94.3 kg (207 lb 14.3 oz)            Gen:  NAD, weak appearing  HEENT: moist mucous membranes CV: Regular rate and rhythm, no murmurs rubs or gallops PULM: clear to auscultation bilaterally. No wheezes/rales/rhonchi ABD: soft/nontender/nondistended/normal bowel sounds EXT: No edema Neuro: Alert and oriented x3, mood appropriate, generalized weakness   SUMMARY OF RECOMMENDATIONS   Continue with current plan of care per medical team  Extensive discussion with patient's daughter and brothers. Encouraged family to continue ongoing discussions focusing on patient's quality of life and arrangements at discharge. Introduction to outpatient palliative vs hospice (which was recommended in the setting of no dialysis and poor prognosis). Education provided on expectations, prognosis, and disease trajectory. Patient and family confirms no dialysis. Family is planning to meet and  continue ongoing discussions over the next 24 hours.  PMT will continue to support and follow on as needed basis. Plans to follow-up with daughter on Monday allowing time for family discussions. Please secure chat for urgent needs.   Discussed with Dr. Avon Gully and RN.   Time Total: 75 min.   Visit consisted of counseling and education dealing with the complex and emotionally intense issues of symptom management and palliative care in the setting of serious and potentially life-threatening illness.Greater than 50%  of this time was spent counseling and coordinating care related to the above assessment and plan.  Alda Lea, AGPCNP-BC  Palliative Medicine Team [phone]  Palliative Medicine Team providers are available by phone from 7am to 7pm daily and can be reached through the team cell phone. Should this patient require assistance outside of these hours, please call the patient's attending physician.

## 2020-11-22 NOTE — Progress Notes (Addendum)
Occupational Therapy Treatment Patient Details Name: Jenna [name] MRN: [id number] DOB: [birth year] Today's Date: 11/22/2020   History of present illness Pt is a 65 y.o. female who was admitted earlier this month for acute kidney injury, pyelonephritis, Pseudomonas UTI, melanotic stools and acute systolic CHF.  Also was noted to have large right-sided pleural effusion.  She was stabilized and then discharged to a skilled nursing facility on 9/28.  Patient presented back to ED on 9/29 with complaints of bilateral leg pain and swelling, abdominal pain nausea chest pain and admitted for Acute on chronic systolic CHF and Chronic kidney disease stage IV/history of renal transplant. Past medical history significant of anemia, CAD, depression, chronic kidney disease stage IV, status post renal transplant on immunosuppressants, hypertension, focal segmental glomerularsclerosis, systolic CHF EF 20-25, lupus nephritis, ischemic cardiomyopathy, history of LA thrombus, obesity, paroxysmal atrial fibrillation, CVA of parietal lobe, known CAD status post DES to LAD in September 2020.   OT comments  Patient seated up in recliner and agreeable to therapy when therapist entered the room. Patient able to pull herself up into upright sitting position by using upper extremities on chair arms. Patient sat at edge of recliner for approx 10 min and perform grooming task. However patient requires significant time to perform each task and all movement. She reports "it feels like i'm tied up with ropes" but also increased time to motivate herself. She was max assist to stand from recliner and min assist to take steps to bed with RW. She kept her eyes closed. Max assist to return to supine. Patient continues to be limited by poor activity tolerance and fatigue. Patient's renal function has not improved. Cont POC.   Recommendations for follow up therapy are one component of a multi-disciplinary discharge planning process, led by  the attending physician.  Recommendations may be updated based on patient status, additional functional criteria and insurance authorization.    Follow Up Recommendations  SNF    Equipment Recommendations  None recommended by OT    Recommendations for Other Services      Precautions / Restrictions Precautions Precautions: Fall Restrictions Weight Bearing Restrictions: No       Mobility Bed Mobility Overal bed mobility: Needs Assistance Bed Mobility: Sit to Supine     Supine to sit: HOB elevated;Mod assist Sit to supine: Max assist   General bed mobility comments: up in recliner. Max assist for LES and trunk negotiation on return to supine.    Transfers Overall transfer level: Needs assistance Equipment used: Rolling walker (2 wheeled) Transfers: Sit to/from Stand Sit to Stand: Max assist Stand pivot transfers: Min assist       General transfer comment: . Attempted initial stand with walker but patient unable to power up. Removed walker and performed more faciliation technique with max assist with two assistance to stand from recliner height. Once in standing patient min assist with walker to take steps to bed. patinet keeping eyes closed and needed verbal cues to guide her to the bed. Patient swaying and min assist for steadying.    Balance Overall balance assessment: Needs assistance Sitting-balance support: No upper extremity supported Sitting balance-Leahy Scale: Fair     Standing balance support: Bilateral upper extremity supported Standing balance-Leahy Scale: Poor Standing balance comment: reliant on external assistance                           ADL either performed or assessed with clinical judgement  ADL Overall ADL's : Needs assistance/impaired     Grooming: Set up;Wash/dry face;Wash/dry Nurse, mental health Details (indicate cue type and reason): patient seated at edge of recliner to perform grooming task. patient able to wash hands and face  and apply lotion to hands.                                     Vision Patient Visual Report: No change from baseline     Perception     Praxis      Cognition Arousal/Alertness: Awake/alert Behavior During Therapy: Flat affect Overall Cognitive Status: No family/caregiver present to determine baseline cognitive functioning                         Following Commands: Follows one step commands consistently       General Comments: Patient follows commands approprieately and mostly appropriate conversation. Unsure of patient's understanding of her medical condition or the therapeutic process. Some conversation tangential and therapit not able to follow along.        Exercises General Exercises - Lower Extremity Ankle Circles/Pumps: Supine;AROM;Both;10 reps Quad Sets: Supine;AROM;Strengthening;Both;10 reps Hip ABduction/ADduction: Supine;AROM;Strengthening;Both;10 reps   Shoulder Instructions       General Comments HR 70-80s with mobility    Pertinent Vitals/ Pain       Pain Assessment: Faces Faces Pain Scale: Hurts little more Pain Location: generalized Pain Descriptors / Indicators: Grimacing;Sore Pain Intervention(s): Monitored during session  Home Living                                          Prior Functioning/Environment              Frequency  Min 2X/week        Progress Toward Goals  OT Goals(current goals can now be found in the care plan section)  Progress towards OT goals: Progressing toward goals  Acute Rehab OT Goals Patient Stated Goal: get strong enough to go home OT Goal Formulation: With patient Time For Goal Achievement: 11/28/20 Potential to Achieve Goals: [place] Discharge plan remains appropriate    Co-evaluation                 AM-PAC OT "6 Clicks" Daily Activity     Outcome Measure   Help from another person eating meals?: A Little Help from another person taking care  of personal grooming?: A Little Help from another person toileting, which includes using toliet, bedpan, or urinal?: A Lot Help from another person bathing (including washing, rinsing, drying)?: A Lot Help from another person to put on and taking off regular upper body clothing?: A Lot Help from another person to put on and taking off regular lower body clothing?: A Lot 6 Click Score: 14    End of Session Equipment Utilized During Treatment: Rolling walker;Gait belt  OT Visit Diagnosis: Unsteadiness on feet (R26.81);Other abnormalities of gait and mobility (R26.89);Muscle weakness (generalized) (M62.81);Pain   Activity Tolerance Patient limited by fatigue   Patient Left in bed;with call bell/phone within reach;with bed alarm set;with family/visitor present   Nurse Communication Mobility status        Time: 3570-1779 OT Time Calculation (min): 26 min  Charges: OT General Charges $OT Visit: 1 Visit OT Treatments $Self Care/Home Management :  8-22 mins $Therapeutic Activity: 8-22 mins  Derl Barrow, OTR/L Acute Care Rehab Services  Office [phone] Pager: [place] 11/22/2020, 4:01 PM

## 2020-11-22 NOTE — Plan of Care (Signed)
  Problem: Activity: Goal: Risk for activity intolerance will decrease Outcome: Progressing   Problem: Safety: Goal: Ability to remain free from injury will improve Outcome: Progressing   Problem: Skin Integrity: Goal: Risk for impaired skin integrity will decrease Outcome: Progressing   Problem: Nutrition: Goal: Adequate nutrition will be maintained Outcome: Not Progressing

## 2020-11-22 NOTE — Progress Notes (Signed)
PROGRESS NOTE    Jenna [name]  CVE:[id number] DOB: 1955-12-11 DOA: 11/13/2020 PCP: [name], Betty G, MD   Brief Narrative:  Jenna [name] is a 65 y.o. female with medical history significant of anemia, CAD, depression, chronic kidney disease stage IV, status post renal transplant on immunosuppressants, hypertension, focal segmental glomerularsclerosis, systolic CHF EF 02-58, lupus nephritis, ischemic cardiomyopathy, history of LA thrombus, obesity, paroxysmal atrial fibrillation, CVA of parietal lobe, known CAD status post DES to LAD in September 2020.  She was admitted earlier this month for acute kidney injury, pyelonephritis, Pseudomonas UTI, melanotic stools and acute systolic CHF.  Also was noted to have large right-sided pleural effusion.  She was stabilized and then discharged to a skilled nursing facility on 9/28.  Patient presented with complaints of bilateral leg pain and swelling, abdominal pain nausea chest pain.  She felt that she had not really improved.  Assessment & Plan:  Goals of care -Palliative care continues to follow, appreciate insight and recommendations  -Unfortunately at this time it appears patient's kidney function continues to deteriorate despite supportive care.  Discussed at length with patient's daughter and primary decision maker [name] about her general decline. We discussed possible need for transient dialysis but do not think she would be a suitable candidate given her worsening cardiac dysfunction and renal dysfunction in the setting of advanced cardiorenal syndrome.  -Tentative plan at this point is to follow patient clinically over the weekend, if creatinine and mental status continued to deteriorate will advance forward with palliative care, if she improves we will follow along and discuss disposition and outpatient follow-up thereafter.   -Given worsening labs patient will likely need to transition to palliative care, family discussion ongoing this  weekend for goals of care, unclear if patient wants more aggressive treatment options or if she would rather be discharged home with family on palliative care and hospice.  Acute on chronic systolic CHF, stable - EF is known to be about 20 to 25%.   - [facility]ed [facility] - started on IV diuretics.  Cardiology was consulted initially with worsening creatinine - diuretics now off (see below) -Continue off all antihypertensives given hypotension over the past week  Acute on chronic kidney disease stage IV/history of renal transplant -Baseline creatinine 3 -3.5  -Creatinine continues to climb despite supportive care off nephrotoxic medications -Nephrology signing off given no indication for dialysis given discussion with family and patient. -Previous CT of the abdomen did not show any abnormalities of the renal transplant, renal ultrasound shows main transplant renal artery and vein are patent with normal grayscale appearance - On valganciclovir for history of CMV   Pneumonia, aspiration versus community-acquired, resolved -Completed antibiotic therapy -Patient continues to sat well into the 90s on room air, oxygen discontinued, reminded staff oxygen is to be used only for hypoxia not symptom management.   Epigastric abdominal pain, resolved -Abdomen benign today, previous imaging unremarkable  Acute metabolic encephalopathy, ongoing - Likely multifactorial given above infection, worsening creatinine/BUN  - High risk for sundowning given age and comorbidities  - Previous imaging of the head and brain unremarkable   History of paroxysmal atrial fibrillation, rate controlled -Transient bradycardia, beta-blockers held in the interim, will resume once indicated but heart rate remains well controlled in the 60s and 70s off metoprolol  - Home medication list does not show that she is on anticoagulation.  Noted to be just on aspirin.  Looks like she was on Eliquis previously but was not  continued  due to concern for GI bleed.   Pancytopenia -Chronic, remained stable -Likely secondary to SLE/CKD/chronic disease, She is on hydroxychloroquine and tacrolimus both of which can cause myelosuppression  History of SLE with history of lupus nephritis - On Plaquenil which was being continued.  Minimal to no improvement in pancytopenia  Hypertension in the setting of chronic kidney disease stage IV  -Off medications given above AKI and bradycardia  History of coronary artery disease Followed at [facility].  Continue aspirin.  Continue statin  Recent Pseudomonas UTI/pyelonephritis involving transplanted kidney Recently completed 7-day course of IV antibiotics with meropenem.   Obesity Estimated body mass index is 33.42 kg/m as calculated from the following:   Height as of this encounter: $RemoveBeforeD'5\' 7"'EOPzjLXhtfCvkI$  (1.702 m).   Weight as of this encounter: 96.8 kg.  DVT prophylaxis: SCDs Code Status: Full Family Communication: None available  Status is: Inpatient  Dispo: The patient is from: Home              Anticipated d/c is to: To be determined              Anticipated d/c date is: 48 to 72 hours              Patient currently not medically stable for discharge  Consultants:  Nephrology  Procedures:  None  Antimicrobials:  Completed as above  Subjective: No acute issues, events noted overnight.  Patient remains awake alert oriented denies nausea vomiting diarrhea constipation headache fevers chills or chest pain.  Reports general weakness and fatigue but otherwise feels "okay" daughter reviewed at bedside another lengthy discussion about prognosis and possible transition to palliative care pending ongoing family discussion this weekend  Objective: Vitals:   11/21/20 1100 11/21/20 2209 11/22/20 0500 11/22/20 0625  BP: 140/83 137/75  135/87  Pulse: 67 74  69  Resp: $Remo'18 18  18  'FDlxC$ Temp: 97.7 F (36.5 C) (!) 97.4 F (36.3 C)  (!) 97.5 F (36.4 C)  TempSrc: Axillary Oral  Oral   SpO2: 97% 96%  98%  Weight:   94.3 kg   Height:        Intake/Output Summary (Last 24 hours) at 11/22/2020 0821 Last data filed at 11/22/2020 0700 Gross per 24 hour  Intake 1791.78 ml  Output 50 ml  Net 1741.78 ml    Filed Weights   11/20/20 0500 11/21/20 0515 11/22/20 0500  Weight: 96.8 kg 93.9 kg 94.3 kg    Examination:  General exam: Appears calm and comfortable, oriented to person place and general situation Respiratory system: Clear to auscultation. Respiratory effort normal. Cardiovascular system: S1 & S2 heard, RRR. No JVD, murmurs, rubs, gallops or clicks. No pedal edema. Gastrointestinal system: Abdomen is nondistended, soft and nontender. No organomegaly or masses felt. Normal bowel sounds heard. Central nervous system: Alert and oriented. No focal neurological deficits. Extremities: Symmetric 5 x 5 power. Skin: No rashes, lesions or ulcers Psychiatry: Judgement and insight appear normal. Mood & affect appropriate.   Data Reviewed: I have personally reviewed following labs and imaging studies  CBC: Recent Labs  Lab 11/17/20 0339 11/19/20 0652 11/20/20 0806 11/22/20 0429  WBC 1.4* 2.0* 1.6* 1.7*  NEUTROABS 0.6* 0.8*  --   --   HGB 10.6* 10.9* 10.8* 11.5*  HCT 32.4* 34.4* 33.1* 35.6*  MCV 89.0 91.5 89.2 90.4  PLT 69* 69* 67* 81*    Basic Metabolic Panel: Recent Labs  Lab 11/17/20 0339 11/17/20 1925 11/18/20 0355 11/19/20 0355 11/20/20 0806 11/21/20 0827  NA 134* 136 138 133* 131* 142  K 4.4 4.4 4.4 4.8 5.0 5.6*  CL 105 105 108 101 102 107  CO2 19* 20* 20* 19* 17* 20*  GLUCOSE 105* 100* 84 101* 89 80  BUN 97* 99* 96* 97* 105* 109*  CREATININE 3.85* 4.00* 4.48* 4.64* 5.02* 5.22*  CALCIUM 9.0 9.2 9.3 9.4 9.1 10.0  PHOS 5.1*  --  6.3* 5.5* 5.6* 6.4*    GFR: Estimated Creatinine Clearance: 12.7 mL/min (A) (by C-G formula based on SCr of 5.22 mg/dL (H)). Liver Function Tests: Recent Labs  Lab 11/17/20 1925 11/18/20 0355 11/19/20 0355  11/20/20 0806 11/21/20 0827  AST 28 27  --   --   --   ALT 12 11  --   --   --   ALKPHOS 96 87  --   --   --   BILITOT 1.3* 1.2  --   --   --   PROT 6.3* 6.0*  --   --   --   ALBUMIN 3.0* 2.9*  3.0* 3.0* 3.0* 3.4*    Recent Labs  Lab 11/17/20 1925 11/18/20 0355  LIPASE 43 34    No results for input(s): AMMONIA in the last 168 hours. Coagulation Profile: No results for input(s): INR, PROTIME in the last 168 hours. Cardiac Enzymes: No results for input(s): CKTOTAL, CKMB, CKMBINDEX, TROPONINI in the last 168 hours.  BNP (last 3 results) No results for input(s): PROBNP in the last 8760 hours. HbA1C: No results for input(s): HGBA1C in the last 72 hours. CBG: No results for input(s): GLUCAP in the last 168 hours. Lipid Profile: No results for input(s): CHOL, HDL, LDLCALC, TRIG, CHOLHDL, LDLDIRECT in the last 72 hours. Thyroid Function Tests: No results for input(s): TSH, T4TOTAL, FREET4, T3FREE, THYROIDAB in the last 72 hours. Anemia Panel: No results for input(s): VITAMINB12, FOLATE, FERRITIN, TIBC, IRON, RETICCTPCT in the last 72 hours.  Sepsis Labs: No results for input(s): PROCALCITON, LATICACIDVEN in the last 168 hours.   Recent Results (from the past 240 hour(s))  Resp Panel by RT-PCR (Flu A&B, Covid) Nasopharyngeal Swab     Status: None   Collection Time: 11/13/20  5:49 PM   Specimen: Nasopharyngeal Swab; Nasopharyngeal(NP) swabs in vial transport medium  Result Value Ref Range Status   SARS Coronavirus 2 by RT PCR NEGATIVE NEGATIVE Final    Comment: (NOTE) SARS-CoV-2 target nucleic acids are NOT DETECTED.  The SARS-CoV-2 RNA is generally detectable in upper respiratory specimens during the acute phase of infection. The lowest concentration of SARS-CoV-2 viral copies this assay can detect is 138 copies/mL. A negative result does not preclude SARS-Cov-2 infection and should not be used as the sole basis for treatment or other patient management decisions. A  negative result may occur with  improper specimen collection/handling, submission of specimen other than nasopharyngeal swab, presence of viral mutation(s) within the areas targeted by this assay, and inadequate number of viral copies(<138 copies/mL). A negative result must be combined with clinical observations, patient history, and epidemiological information. The expected result is Negative.  Fact Sheet for Patients:  EntrepreneurPulse.com.au  Fact Sheet for Healthcare Providers:  IncredibleEmployment.be  This test is no t yet approved or cleared by the [place] FDA and  has been authorized for detection and/or diagnosis of SARS-CoV-2 by FDA under an Emergency Use Authorization (EUA). This EUA will remain  in effect (meaning this test can be used) for the duration of the COVID-19 declaration under Section 564(b)(1) of the  Act, 21 U.S.C.section 360bbb-3(b)(1), unless the authorization is terminated  or revoked sooner.       Influenza A by PCR NEGATIVE NEGATIVE Final   Influenza B by PCR NEGATIVE NEGATIVE Final    Comment: (NOTE) The Xpert Xpress SARS-CoV-2/FLU/RSV plus assay is intended as an aid in the diagnosis of influenza from Nasopharyngeal swab specimens and should not be used as a sole basis for treatment. Nasal washings and aspirates are unacceptable for Xpert Xpress SARS-CoV-2/FLU/RSV testing.  Fact Sheet for Patients: EntrepreneurPulse.com.au  Fact Sheet for Healthcare Providers: IncredibleEmployment.be  This test is not yet approved or cleared by the [place] FDA and has been authorized for detection and/or diagnosis of SARS-CoV-2 by FDA under an Emergency Use Authorization (EUA). This EUA will remain in effect (meaning this test can be used) for the duration of the COVID-19 declaration under Section 564(b)(1) of the Act, 21 U.S.C. section 360bbb-3(b)(1), unless the authorization  is terminated or revoked.  Performed at [facility], [place] [address] [place], [place] [address]   Culture, blood (Routine X 2) w Reflex to ID Panel     Status: None   Collection Time: 11/14/20  7:11 AM   Specimen: BLOOD RIGHT HAND  Result Value Ref Range Status   Specimen Description   Final    BLOOD RIGHT HAND Performed at [facility] [address] [place], [place] [address]    Special Requests   Final    BOTTLES DRAWN AEROBIC ONLY Blood Culture results may not be optimal due to an inadequate volume of blood received in culture bottles Performed at [facility] [address] [place], [place] [address]    Culture   Final    NO GROWTH 5 DAYS Performed at [facility], [place] [address], [place], [place] [address]    Report Status 11/19/2020 FINAL  Final  MRSA Next Gen by PCR, Nasal     Status: None   Collection Time: 11/14/20  6:00 PM   Specimen: Nasal Mucosa; Nasal Swab  Result Value Ref Range Status   MRSA by PCR Next Gen NOT DETECTED NOT DETECTED Final    Comment: RESULT CALLED TO, READ BACK BY AND VERIFIED WITH: SCOTPON J $RemoveB'@0535'fpVvirRO$  BY BATTLET Performed at [facility] [address] [place],  [address]           Radiology Studies: No results found.  Scheduled Meds:  acetaminophen  1,000 mg Oral TID   aspirin EC  81 mg Oral q morning   citalopram  10 mg Oral Daily   feeding supplement (NEPRO CARB STEADY)  237 mL Oral TID BM   pantoprazole  40 mg Oral BID   predniSONE  5 mg Oral Daily   rosuvastatin  5 mg Oral q1800   sodium bicarbonate  650 mg Oral TID   sodium chloride flush  3 mL Intravenous Q12H   tacrolimus  0.5 mg Oral BID   valGANciclovir  450 mg Oral QODAY   Continuous Infusions:  sodium chloride     sodium chloride 50 mL/hr at 11/22/20 0501     LOS: 8 days   Time spent: 15min  Brogan England C Latice Waitman, DO [facility]  If 7PM-7AM, please contact  night-coverage [email or web address]  11/22/2020, 8:21 AM

## 2020-11-22 NOTE — Progress Notes (Addendum)
Notified MD of 5 beat run of vtach, VS documented, patient asymptomatic.  No new orders received.

## 2020-11-22 NOTE — Progress Notes (Signed)
Physical Therapy Treatment Patient Details Name: Jenna [name] MRN: [id number] DOB: 29-Sep-1955 Today's Date: 11/22/2020   History of Present Illness Pt is a 65 y.o. female who was admitted earlier this month for acute kidney injury, pyelonephritis, Pseudomonas UTI, melanotic stools and acute systolic CHF.  Also was noted to have large right-sided pleural effusion.  She was stabilized and then discharged to a skilled nursing facility on 9/28.  Patient presented back to ED on 9/29 with complaints of bilateral leg pain and swelling, abdominal pain nausea chest pain and admitted for Acute on chronic systolic CHF and Chronic kidney disease stage IV/history of renal transplant. Past medical history significant of anemia, CAD, depression, chronic kidney disease stage IV, status post renal transplant on immunosuppressants, hypertension, focal segmental glomerularsclerosis, systolic CHF EF 65-99, lupus nephritis, ischemic cardiomyopathy, history of LA thrombus, obesity, paroxysmal atrial fibrillation, CVA of parietal lobe, known CAD status post DES to LAD in September 2020.    PT Comments    Pt requires heavy encouragement and education on therapeutic process, benefits of mobility, and time OOB. Initiated session with supine LE strengthening exercises, pt performing in limited ROM but able to actively participate, denies pain. Assisted pt to sitting from supine with mod A using bed pad and increased time. Pt powers to stand from bed slightly elevated with therapist and daughter at side for encouragement with min A. Pt pivots to recliner with increased time, taking slow steps to piovt. Pt declines ambulation. Once in recliner, pt states abdominal pain, some nausea and chest pain rated 9/10- daughter states this is chronic, happens randomly and takes time to pass; RN in room administering pain medication and assessing pt. Pt encouraged to remain up in chair as long as tolerable, daughter in room encouraging pt.    Recommendations for follow up therapy are one component of a multi-disciplinary discharge planning process, led by the attending physician.  Recommendations may be updated based on patient status, additional functional criteria and insurance authorization.  Follow Up Recommendations  SNF     Equipment Recommendations  3in1 (PT);Other (comment) (rollator)    Recommendations for Other Services       Precautions / Restrictions Precautions Precautions: Fall Restrictions Weight Bearing Restrictions: No     Mobility  Bed Mobility Overal bed mobility: Needs Assistance Bed Mobility: Supine to Sit  Supine to sit: HOB elevated;Mod assist  General bed mobility comments: pt inches BLE 3-4 inches towards EOB, able to reach for bedrail with BUE, mod A with use of bedpad to bring pt to EOB and upright trunk    Transfers Overall transfer level: Needs assistance Equipment used: Rolling walker (2 wheeled) Transfers: Sit to/from [facility] Sit to Stand: Min assist Stand pivot transfers: Min guard  General transfer comment: min A to power to stand with BUE assisting, VCs for upright posture, min guard with increased time to pivot to recliner  Ambulation/Gait  General Gait Details: pt declines   Stairs             Wheelchair Mobility    Modified Rankin (Stroke Patients Only)       Balance Overall balance assessment: Needs assistance Sitting-balance support: No upper extremity supported;Feet supported Sitting balance-Leahy Scale: Fair  Standing balance support: Bilateral upper extremity supported;During functional activity Standing balance-Leahy Scale: Poor Standing balance comment: reliant on UE support of RW     Cognition Arousal/Alertness: Awake/alert Behavior During Therapy: Flat affect Overall Cognitive Status: Within Functional Limits for tasks assessed  General Comments:  follows commands approriately, pt appears anxious/hesitant with mobility-  daughter in room encouraging pt      Exercises General Exercises - Lower Extremity Ankle Circles/Pumps: Supine;AROM;Both;10 reps Quad Sets: Supine;AROM;Strengthening;Both;10 reps Hip ABduction/ADduction: Supine;AROM;Strengthening;Both;10 reps    General Comments General comments (skin integrity, edema, etc.): HR 70-80s with mobility      Pertinent Vitals/Pain Pain Assessment: Faces Pain Score: 9  Pain Location: abdomen and chest Pain Descriptors / Indicators: Sore;Discomfort Pain Intervention(s): Limited activity within patient's tolerance;Monitored during session;Repositioned;RN gave pain meds during session    Home Living                      Prior Function            PT Goals (current goals can now be found in the care plan section) Acute Rehab PT Goals Patient Stated Goal: get strong enough to go home PT Goal Formulation: With patient Time For Goal Achievement: 11/28/20 Potential to Achieve Goals: Fair Progress towards PT goals: Progressing toward goals    Frequency    Min 2X/week      PT Plan Current plan remains appropriate    Co-evaluation              AM-PAC PT "6 Clicks" Mobility   Outcome Measure  Help needed turning from your back to your side while in a flat bed without using bedrails?: A Lot Help needed moving from lying on your back to sitting on the side of a flat bed without using bedrails?: A Lot Help needed moving to and from a bed to a chair (including a wheelchair)?: A Little Help needed standing up from a chair using your arms (e.g., wheelchair or bedside chair)?: A Little Help needed to walk in hospital room?: A Lot Help needed climbing 3-5 steps with a railing? : A Lot 6 Click Score: 14    End of Session Equipment Utilized During Treatment: Gait belt Activity Tolerance: Patient limited by fatigue;Patient limited by pain Patient left: in chair;with call bell/phone within reach;with nursing/sitter in room;with  family/visitor present Nurse Communication: Mobility status PT Visit Diagnosis: Unsteadiness on feet (R26.81);Muscle weakness (generalized) (M62.81);Other abnormalities of gait and mobility (R26.89);Difficulty in walking, not elsewhere classified (R26.2)     Time: 5208-0223 PT Time Calculation (min) (ACUTE ONLY): 46 min  Charges:  $Therapeutic Exercise: 8-22 mins $Therapeutic Activity: 23-37 mins                      Tori Kida Digiulio PT, DPT 11/22/20, 12:10 PM

## 2020-11-23 NOTE — Progress Notes (Signed)
PROGRESS NOTE    SACORA HAWBAKER  TWS:[id number] DOB: 04-Feb-1956 DOA: 11/13/2020 PCP: [name], Betty G, MD   Brief Narrative:  Jenna [name] is a 65 y.o. female with medical history significant of anemia, CAD, depression, chronic kidney disease stage IV, status post renal transplant on immunosuppressants, hypertension, focal segmental glomerularsclerosis, systolic CHF EF 00-17, lupus nephritis, ischemic cardiomyopathy, history of LA thrombus, obesity, paroxysmal atrial fibrillation, CVA of parietal lobe, known CAD status post DES to LAD in September 2020.  She was admitted earlier this month for acute kidney injury, pyelonephritis, Pseudomonas UTI, melanotic stools and acute systolic CHF.  Also was noted to have large right-sided pleural effusion.  She was stabilized and then discharged to a skilled nursing facility on 9/28.  Patient presented with complaints of bilateral leg pain and swelling, abdominal pain nausea chest pain.  She felt that she had not really improved.  Assessment & Plan:  Goals of care - Palliative care continues to follow, appreciate insight and recommendations  -There appears to be some confusion about palliative care/hospice and what that entails as the patient remains full code, will have palliative care continue to discuss goals of care with patient and family as patient's clinical condition continues to progress -Fortunately at this point her kidney function appears to have stabilized although markedly elevated, again we discussed dialysis which we all agreed was not likely to be tolerated -Likely to discharge back to previous facility with outpatient palliative care follow-up, we can certainly repeat labs in the next week to 2 weeks to follow kidney function -but if the kidney function continues to worsen it appears the family would be agreeable for palliative and hospice transition at that time  Acute on chronic systolic CHF, stable - EF around 20 to 25%.   -  Followed by [facility] -we will continue to hold diuretics in the setting of tentative kidney function, would not continue diuretics at discharge given her profound AKI - Patient remains off all antihypertensives and core measures at this time due to intolerance and hypotension in the setting of advancing cardiorenal syndrome  Acute on chronic kidney disease stage IV/history of renal transplant - Baseline creatinine 3 -3.5  -Creatinine stabilizing at 5.2, unclear if this is her new baseline or if her kidney function will continue to improve over the next weeks, will not restart diuretics at this point, hold all nephrotoxic's and blood pressure medications given hypotension in the setting of worsening cardiorenal syndrome -Nephrology previously following but previously signed off given patient and family discussion surrounding likely intolerance of dialysis - Previous CT of the abdomen did not show any abnormalities of the renal transplant, renal ultrasound shows main transplant renal artery and vein are patent with normal grayscale appearance - On valganciclovir for history of CMV   Pneumonia, aspiration versus community-acquired, resolved - Completed antibiotic therapy - Patient continues to sat well into the 90s on room air, oxygen discontinued, reminded staff oxygen is to be used only for hypoxia not symptom management.   Epigastric abdominal pain, resolved - Abdomen benign today, previous imaging unremarkable  Acute metabolic encephalopathy, ongoing - Likely multifactorial given above infection, worsening creatinine/BUN  - High risk for sundowning given age and comorbidities  - Previous imaging of the head and brain unremarkable   History of paroxysmal atrial fibrillation, rate controlled -Transient bradycardia, beta-blockers held in the interim, will resume once indicated but heart rate remains well controlled in the 60s and 70s off metoprolol  - Home medication list  does not show  that she is on anticoagulation.  Noted to be just on aspirin.  Looks like she was on Eliquis previously but was not continued due to concern for GI bleed.   Pancytopenia - Chronic, remained stable - Likely secondary to SLE/CKD/chronic disease, She is on hydroxychloroquine and tacrolimus both of which can cause myelosuppression  History of SLE with history of lupus nephritis - On Plaquenil which was being continued.  Minimal to no improvement in pancytopenia  Hypertension in the setting of chronic kidney disease stage IV  - Off medications given above AKI and bradycardia  History of coronary artery disease - Followed at [facility].  Continue aspirin.  Continue statin  Recent Pseudomonas UTI/pyelonephritis involving transplanted kidney - Recently completed 7-day course of IV antibiotics with meropenem.   Obesity - Estimated body mass index is 33.42 kg/m as calculated from the following: Height as of this encounter: $RemoveBeforeD'5\' 7"'EmiHpeqeoMEUYk$  (1.702 m). Weight as of this encounter: 96.8 kg.  DVT prophylaxis: SCDs Code Status: Full Family Communication: None available  Status is: Inpatient  Dispo: The patient is from: SNF              Anticipated d/c is to: SNF              Anticipated d/c date is: 24 to 48 hours              Patient currently not medically stable for discharge  Consultants:  Nephrology  Procedures:  None  Antimicrobials:  Completed as above  Subjective: No acute issues, events noted overnight.  Patient somewhat excited her labs have stabilized, denies nausea vomiting diarrhea constipation headache fevers chills or chest pain.  Objective: Vitals:   11/22/20 1923 11/22/20 2024 11/23/20 0114 11/23/20 0413  BP: (!) 152/100 (!) 145/91 134/81 134/87  Pulse: 81 69 67 70  Resp:  $Remo'18 14 20  'nFuOF$ Temp:  98 F (36.7 C) 97.6 F (36.4 C) 98.6 F (37 C)  TempSrc:  Oral Oral   SpO2: 96% 97% 98% 99%  Weight:      Height:        Intake/Output Summary (Last 24 hours) at 11/23/2020  0801 Last data filed at 11/22/2020 1621 Gross per 24 hour  Intake 429.48 ml  Output 25 ml  Net 404.48 ml    Filed Weights   11/20/20 0500 11/21/20 0515 11/22/20 0500  Weight: 96.8 kg 93.9 kg 94.3 kg    Examination:  General exam: Appears calm and comfortable, oriented to person place and general situation Respiratory system: Clear to auscultation. Respiratory effort normal. Cardiovascular system: S1 & S2 heard, RRR. No JVD, murmurs, rubs, gallops or clicks. No pedal edema. Gastrointestinal system: Abdomen is nondistended, soft and nontender. No organomegaly or masses felt. Normal bowel sounds heard. Central nervous system: Alert and oriented. No focal neurological deficits. Extremities: Symmetric 5 x 5 power. Skin: No rashes, lesions or ulcers Psychiatry: Judgement and insight appear normal. Mood & affect appropriate.   Data Reviewed: I have personally reviewed following labs and imaging studies  CBC: Recent Labs  Lab 11/17/20 0339 11/19/20 0652 11/20/20 0806 11/22/20 0429  WBC 1.4* 2.0* 1.6* 1.7*  NEUTROABS 0.6* 0.8*  --   --   HGB 10.6* 10.9* 10.8* 11.5*  HCT 32.4* 34.4* 33.1* 35.6*  MCV 89.0 91.5 89.2 90.4  PLT 69* 69* 67* 81*    Basic Metabolic Panel: Recent Labs  Lab 11/17/20 0339 11/17/20 1925 11/18/20 0355 11/19/20 0355 11/20/20 6568 11/21/20 0827 11/22/20 1275  NA 134*   < > 138 133* 131* 142 135  K 4.4   < > 4.4 4.8 5.0 5.6* 4.7  CL 105   < > 108 101 102 107 104  CO2 19*   < > 20* 19* 17* 20* 17*  GLUCOSE 105*   < > 84 101* 89 80 109*  BUN 97*   < > 96* 97* 105* 109* 112*  CREATININE 3.85*   < > 4.48* 4.64* 5.02* 5.22* 5.25*  CALCIUM 9.0   < > 9.3 9.4 9.1 10.0 8.9  PHOS 5.1*  --  6.3* 5.5* 5.6* 6.4*  --    < > = values in this interval not displayed.    GFR: Estimated Creatinine Clearance: 12.6 mL/min (A) (by C-G formula based on SCr of 5.25 mg/dL (H)). Liver Function Tests: Recent Labs  Lab 11/17/20 1925 11/18/20 0355 11/19/20 0355  11/20/20 0806 11/21/20 0827  AST 28 27  --   --   --   ALT 12 11  --   --   --   ALKPHOS 96 87  --   --   --   BILITOT 1.3* 1.2  --   --   --   PROT 6.3* 6.0*  --   --   --   ALBUMIN 3.0* 2.9*  3.0* 3.0* 3.0* 3.4*    Recent Labs  Lab 11/17/20 1925 11/18/20 0355  LIPASE 43 34    No results for input(s): AMMONIA in the last 168 hours. Coagulation Profile: No results for input(s): INR, PROTIME in the last 168 hours. Cardiac Enzymes: No results for input(s): CKTOTAL, CKMB, CKMBINDEX, TROPONINI in the last 168 hours.  BNP (last 3 results) No results for input(s): PROBNP in the last 8760 hours. HbA1C: No results for input(s): HGBA1C in the last 72 hours. CBG: No results for input(s): GLUCAP in the last 168 hours. Lipid Profile: No results for input(s): CHOL, HDL, LDLCALC, TRIG, CHOLHDL, LDLDIRECT in the last 72 hours. Thyroid Function Tests: No results for input(s): TSH, T4TOTAL, FREET4, T3FREE, THYROIDAB in the last 72 hours. Anemia Panel: No results for input(s): VITAMINB12, FOLATE, FERRITIN, TIBC, IRON, RETICCTPCT in the last 72 hours.  Sepsis Labs: No results for input(s): PROCALCITON, LATICACIDVEN in the last 168 hours.   Recent Results (from the past 240 hour(s))  Resp Panel by RT-PCR (Flu A&B, Covid) Nasopharyngeal Swab     Status: None   Collection Time: 11/13/20  5:49 PM   Specimen: Nasopharyngeal Swab; Nasopharyngeal(NP) swabs in vial transport medium  Result Value Ref Range Status   SARS Coronavirus 2 by RT PCR NEGATIVE NEGATIVE Final    Comment: (NOTE) SARS-CoV-2 target nucleic acids are NOT DETECTED.  The SARS-CoV-2 RNA is generally detectable in upper respiratory specimens during the acute phase of infection. The lowest concentration of SARS-CoV-2 viral copies this assay can detect is 138 copies/mL. A negative result does not preclude SARS-Cov-2 infection and should not be used as the sole basis for treatment or other patient management decisions. A  negative result may occur with  improper specimen collection/handling, submission of specimen other than nasopharyngeal swab, presence of viral mutation(s) within the areas targeted by this assay, and inadequate number of viral copies(<138 copies/mL). A negative result must be combined with clinical observations, patient history, and epidemiological information. The expected result is Negative.  Fact Sheet for Patients:  EntrepreneurPulse.com.au  Fact Sheet for Healthcare Providers:  IncredibleEmployment.be  This test is no t yet approved or cleared by the [place]  States FDA and  has been authorized for detection and/or diagnosis of SARS-CoV-2 by FDA under an Emergency Use Authorization (EUA). This EUA will remain  in effect (meaning this test can be used) for the duration of the COVID-19 declaration under Section 564(b)(1) of the Act, 21 U.S.C.section 360bbb-3(b)(1), unless the authorization is terminated  or revoked sooner.       Influenza A by PCR NEGATIVE NEGATIVE Final   Influenza B by PCR NEGATIVE NEGATIVE Final    Comment: (NOTE) The Xpert Xpress SARS-CoV-2/FLU/RSV plus assay is intended as an aid in the diagnosis of influenza from Nasopharyngeal swab specimens and should not be used as a sole basis for treatment. Nasal washings and aspirates are unacceptable for Xpert Xpress SARS-CoV-2/FLU/RSV testing.  Fact Sheet for Patients: EntrepreneurPulse.com.au  Fact Sheet for Healthcare Providers: IncredibleEmployment.be  This test is not yet approved or cleared by the [place] FDA and has been authorized for detection and/or diagnosis of SARS-CoV-2 by FDA under an Emergency Use Authorization (EUA). This EUA will remain in effect (meaning this test can be used) for the duration of the COVID-19 declaration under Section 564(b)(1) of the Act, 21 U.S.C. section 360bbb-3(b)(1), unless the authorization  is terminated or revoked.  Performed at [facility], [place] [address], [place], [place] [address]   Culture, blood (Routine X 2) w Reflex to ID Panel     Status: None   Collection Time: 11/14/20  7:11 AM   Specimen: BLOOD RIGHT HAND  Result Value Ref Range Status   Specimen Description   Final    BLOOD RIGHT HAND Performed at [place] [address] [place], [place] [address]    Special Requests   Final    BOTTLES DRAWN AEROBIC ONLY Blood Culture results may not be optimal due to an inadequate volume of blood received in culture bottles Performed at [place] [address], [place], [place] [address]    Culture   Final    NO GROWTH 5 DAYS Performed at [facility], [place] [address], [place], [place] [address]    Report Status 11/19/2020 FINAL  Final  MRSA Next Gen by PCR, Nasal     Status: None   Collection Time: 11/14/20  6:00 PM   Specimen: Nasal Mucosa; Nasal Swab  Result Value Ref Range Status   MRSA by PCR Next Gen NOT DETECTED NOT DETECTED Final    Comment: RESULT CALLED TO, READ BACK BY AND VERIFIED WITH: SCOTPON J $RemoveB'@0535'LVrciKmd$  BY BATTLET Performed at [place] [address], [place], [place] [address]           Radiology Studies: No results found.  Scheduled Meds:  acetaminophen  1,000 mg Oral TID   aspirin EC  81 mg Oral q morning   citalopram  10 mg Oral Daily   feeding supplement (NEPRO CARB STEADY)  237 mL Oral TID BM   pantoprazole  40 mg Oral BID   predniSONE  5 mg Oral Daily   rosuvastatin  5 mg Oral q1800   sodium bicarbonate  650 mg Oral TID   sodium chloride flush  3 mL Intravenous Q12H   tacrolimus  0.5 mg Oral BID   valGANciclovir  450 mg Oral QODAY   Continuous Infusions:  sodium chloride       LOS: 9 days   Time spent: 60min  Aeron Donaghey C Marilin Kofman, DO [facility]  If 7PM-7AM, please contact night-coverage [email or web address]  11/23/2020, 8:01  AM

## 2020-11-23 NOTE — Progress Notes (Signed)
Pt had a 6 beat run of v-tach at 1120 this shift. Pt remained asymptomatic throughout and VS are WNL. MD notified. Will continue to monitor.

## 2020-11-24 LAB — BASIC METABOLIC PANEL
Anion gap: 11 (ref 5–15)
BUN: 109 mg/dL — ABNORMAL HIGH (ref 8–23)
CO2: 17 mmol/L — ABNORMAL LOW (ref 22–32)
Calcium: 9.3 mg/dL (ref 8.9–10.3)
Chloride: 105 mmol/L (ref 98–111)
Creatinine, Ser: 5.37 mg/dL — ABNORMAL HIGH (ref 0.44–1.00)
GFR, Estimated: 8 mL/min — ABNORMAL LOW (ref >60)
Glucose, Bld: 79 mg/dL (ref 70–99)
Potassium: 5.5 mmol/L — ABNORMAL HIGH (ref 3.5–5.1)
Sodium: 133 mmol/L — ABNORMAL LOW (ref 135–145)

## 2020-11-24 LAB — RESP PANEL BY RT-PCR (FLU A&B, COVID) ARPGX2
Influenza A by PCR: NEGATIVE
Influenza B by PCR: NEGATIVE
SARS Coronavirus 2 by RT PCR: NEGATIVE

## 2020-11-24 MED ORDER — OXYCODONE HCL 5 MG PO TABS: 5.0000 mg | ORAL_TABLET | Freq: Four times a day (QID) | ORAL | 0 refills | Status: AC | PRN

## 2020-11-24 MED ORDER — LOKELMA 5 G PO PACK: 5.0000 g | PACK | Freq: Every day | ORAL | 0 refills | Status: AC

## 2020-11-24 MED ORDER — SODIUM ZIRCONIUM CYCLOSILICATE 5 G PO PACK
5.0000 g | PACK | Freq: Every day | ORAL | Status: DC
  Filled 2020-11-24: qty 1

## 2020-11-24 NOTE — Progress Notes (Signed)
Manufacturing engineer [facility])  Hospital Liaison: RN note         This patient has been referred to our palliative care services in the community at [facility].  [facility] will continue to follow for any discharge planning needs and to coordinate continuation of palliative care in the outpatient setting.    If you have questions or need assistance, please call [phone] or contact the hospital Liaison listed on AMION.      Thank you for this referral.         Farrel Gordon, RN, [facility] Liaison   [phone]

## 2020-11-24 NOTE — Care Management Important Message (Signed)
Important Message  Patient Details IM Letter given to the Patient. Name: Jenna [name] MRN: [id number] Date of Birth: 04/01/1955   Medicare Important Message Given:  Yes     Kerin Salen 11/24/2020, 12:52 PM

## 2020-11-24 NOTE — NC FL2 (Signed)
[place] LEVEL OF CARE SCREENING TOOL     IDENTIFICATION  Patient Name: Jenna [name] Birthdate: 1956/01/27 Sex: female Admission Date (Current Location): 11/13/2020  [place] and [place] Number:  Kathleen Argue [id number] [place] and Address:  [facility],  [address], [place]      Provider Number: [id number]  Attending Physician Name and Address:  Little Ishikawa, MD  Relative Name and Phone Number:  Terrica, [place] Daughter [phone]  [phone]    Current Level of Care: Hospital Recommended Level of Care: [place] Prior Approval Number:    Date Approved/Denied:   PASRR Number: [id number]  Discharge Plan: SNF    Current Diagnoses: Patient Active Problem List   Diagnosis Date Noted   Fluid overload 11/13/2020   Shortness of breath    AKI (acute kidney injury) ([place]) 11/02/2020   Right upper quadrant pain    Acute renal failure superimposed on stage 4 chronic kidney disease ([place]) 11/01/2020   Pyelonephritis of transplanted kidney 11/01/2020   Thrombocytopenia ([place]) 11/01/2020   Liver mass 11/01/2020   Heme positive stool 11/01/2020   Insomnia 10/29/2020   Peripheral neuropathy 10/13/2020   Acute on chronic systolic CHF (congestive heart failure) ([place]) 07/11/2020   Acute respiratory failure with hypoxia ([place]) 07/11/2020   Cytomegalovirus (CMV) viremia ([place]) 07/11/2020   Elevated troponin 07/11/2020   Anemia of chronic disease 07/11/2020   Acute kidney injury superimposed on chronic kidney disease ([place]) 07/11/2020   Allergic rhinitis 01/01/2020   Anxiety disorder 06/26/2019   HFrEF (heart failure with reduced ejection fraction) ([place]) 03-31-19   CAD (coronary artery disease) March 31, 2019   Paroxysmal atrial fibrillation ([place]) 2019-03-31   Deceased-donor kidney transplant 03/27/2018   Dyslipidemia 11/06/2017   CKD (chronic kidney disease) stage 5, GFR less than 15 ml/min ([name]) 02/15/2017   Back pain  at L4-L5 level 10/31/2014   Muscle spasm of back 10/31/2014   Chronic maxillary sinusitis 07/04/2014   Occipital headache 07/04/2014   Increased urinary frequency 05/23/2014   Falls 05/23/2014   Rash and nonspecific skin eruption 05/23/2014   Hypertension with renal disease 04/16/2014   Encounter for immunization 11/19/2013   Lupus (systemic lupus erythematosus) ([place]) 09/24/2013   TIA (transient ischemic attack) 05/05/2013   Numbness and tingling of left side of face 05/04/2013   Lupus ([place]) 04/15/2011   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    Orientation RESPIRATION BLADDER Height & Weight     Self, Time, Situation, Place  Normal Continent Weight: 207 lb 10.8 oz (94.2 kg) Height:  $Remove'5\' 7"'eszaIAh$  (170.2 cm)  BEHAVIORAL SYMPTOMS/MOOD NEUROLOGICAL BOWEL NUTRITION STATUS      Continent Diet  AMBULATORY STATUS COMMUNICATION OF NEEDS Skin   Limited Assist Verbally Normal                       Personal Care Assistance Level of Assistance  Bathing, Feeding, Dressing Bathing Assistance: Limited assistance Feeding assistance: Independent Dressing Assistance: Limited assistance     Functional Limitations Info  Sight, Hearing, Speech Sight Info: Adequate Hearing Info: Adequate Speech Info: Adequate    SPECIAL CARE FACTORS FREQUENCY  PT (By licensed PT), OT (By licensed OT)     PT Frequency: Minimum 5x a week OT Frequency: Minimum 5x a week.            Contractures Contractures Info: Not present    Additional Factors Info  Code Status, Allergies, Psychotropic Code Status Info:  Full Code Allergies Info: Enalapril Maleate   Penicillins   Chocolate   Tape Psychotropic Info: citalopram (CELEXA) tablet 10 mg         Current Medications (11/24/2020):  This is the current hospital active medication list Current Facility-Administered Medications  Medication Dose Route Frequency Provider Last Rate Last Admin   0.9 %  sodium  chloride infusion  250 mL Intravenous PRN Toy Baker, MD       acetaminophen (TYLENOL) tablet 1,000 mg  1,000 mg Oral TID Pershing Proud, NP   4,782 mg at 11/24/20 0400   albuterol (PROVENTIL) (2.5 MG/3ML) 0.083% nebulizer solution 2.5 mg  2.5 mg Nebulization Q2H PRN Toy Baker, MD       ALPRAZolam Duanne Moron) tablet 0.25 mg  0.25 mg Oral QHS PRN Toy Baker, MD   0.25 mg at 11/23/20 2153   aspirin EC tablet 81 mg  81 mg Oral q morning Toy Baker, MD   81 mg at 11/24/20 0945   calcium carbonate (TUMS - dosed in mg elemental calcium) chewable tablet 200 mg of elemental calcium  1 tablet Oral BID PRN Vinie Sill C, NP   956 mg of elemental calcium at 11/19/20 0339   citalopram (CELEXA) tablet 10 mg  10 mg Oral Daily Doutova, Nyoka Lint, MD   10 mg at 11/24/20 0945   feeding supplement (NEPRO CARB STEADY) liquid 237 mL  237 mL Oral TID BM Bonnielee Haff, MD   237 mL at 11/23/20 2100   HYDROmorphone (DILAUDID) injection 0.5 mg  0.5 mg Intravenous Q4H PRN Bonnielee Haff, MD   0.5 mg at 11/19/20 0340   ondansetron (ZOFRAN) injection 4 mg  4 mg Intravenous O1H PRN Vinie Sill C, NP   4 mg at 11/17/20 1244   oxyCODONE (Oxy IR/ROXICODONE) immediate release tablet 5 mg  5 mg Oral Q6H PRN Bonnielee Haff, MD   5 mg at 11/23/20 2153   pantoprazole (PROTONIX) EC tablet 40 mg  40 mg Oral BID Toy Baker, MD   40 mg at 11/24/20 0946   predniSONE (DELTASONE) tablet 5 mg  5 mg Oral Daily Bonnielee Haff, MD   5 mg at 11/24/20 0945   rosuvastatin (CRESTOR) tablet 5 mg  5 mg Oral q1800 Bonnielee Haff, MD   5 mg at 11/23/20 1732   sodium bicarbonate tablet 650 mg  650 mg Oral TID Toy Baker, MD   650 mg at 11/24/20 0946   sodium chloride flush (NS) 0.9 % injection 3 mL  3 mL Intravenous Q12H Doutova, Anastassia, MD   3 mL at 11/23/20 1031   sodium chloride flush (NS) 0.9 % injection 3 mL  3 mL Intravenous PRN Doutova, Anastassia, MD       sodium zirconium  cyclosilicate (LOKELMA) packet 5 g  5 g Oral Daily Little Ishikawa, MD       tacrolimus (PROGRAF) capsule 0.5 mg  0.5 mg Oral BID Elmarie Shiley, MD   0.5 mg at 11/24/20 0865   valGANciclovir (VALCYTE) 450 MG tablet TABS 450 mg  450 mg Oral Lianne Cure, MD   450 mg at 11/23/20 1031     Discharge Medications: Please see discharge summary for a list of discharge medications.  Relevant Imaging Results:  Relevant Lab Results:   Additional Information SSN [id number]  Ross Ludwig, LCSW

## 2020-11-24 NOTE — TOC Progression Note (Signed)
Transition of Care [facility]) - Progression Note    Patient Details  Name: Jenna [name] MRN: [id number] Date of Birth: 07-16-1955  [facility] CM/SW Contact  Ross Ludwig, [place] Phone Number: 11/24/2020, 10:53 AM  Clinical Narrative:    Patient is from [facility] short term rehab.  Plan to return back to SNF today.  CSW spoke to [facility], they can accept patient today, pending new Covid test.  CSW awaiting DC summary and covid test results.   Expected Discharge Plan: Home/Self Care Barriers to Discharge: Continued Medical Work up  Expected Discharge Plan and Services Expected Discharge Plan: Home/Self Care   Discharge Planning Services: CM Consult   Living arrangements for the past 2 months: Apartment Expected Discharge Date: 11/24/20                                     Social Determinants of Health (SDOH) Interventions    Readmission Risk Interventions Readmission Risk Prevention Plan 11/18/2020 11/05/2020 08/26/2020  Transportation Screening Complete Complete Complete  PCP or Specialist Appt within 3-5 Days - Complete -  HRI or [place] - Complete Complete  Social Work Consult for [place] Planning/Counseling - Complete Complete  Palliative Care Screening - Not Applicable Not Applicable  Medication Review Press photographer) Complete Complete Complete  PCP or Specialist appointment within 3-5 days of discharge Complete - -  [place] or Home Care Consult Complete - -  SW Recovery Care/Counseling Consult Complete - -  Palliative Care Screening Not Applicable - -  [place] Not Complete - -  SNF Comments possible placement - -  Some recent data might be hidden

## 2020-11-24 NOTE — Plan of Care (Signed)

## 2020-11-24 NOTE — Progress Notes (Signed)
Patient refusing IV access at this time.

## 2020-11-24 NOTE — Discharge Summary (Signed)
Physician Discharge Summary  Jenna [name] SNK:[id number] DOB: 12-Aug-1955 DOA: 11/13/2020  PCP: [name], Betty G, MD  Admit date: 11/13/2020 Discharge date: 11/24/2020  Admitted From: SNF Disposition: SNF  Recommendations for Outpatient Follow-up:  Follow up with palliative care as discussed Repeat labs in the next 3 days, as discussed likely will not change management given our discussion as below but if family/patient wish to follow creatinine  Discharge Condition: Stable CODE STATUS: Full Diet recommendation: Renal diet  Brief/Interim Summary: Jenna [name] is a 65 y.o. female with medical history significant of anemia, CAD, depression, chronic kidney disease stage IV, status post renal transplant on immunosuppressants, hypertension, focal segmental glomerularsclerosis, systolic CHF EF 93-79, lupus nephritis, ischemic cardiomyopathy, history of LA thrombus, obesity, paroxysmal atrial fibrillation, CVA of parietal lobe, known CAD status post DES to LAD in September 2020. She was admitted earlier this month for acute kidney injury, pyelonephritis, Pseudomonas UTI, melanotic stools and acute systolic CHF.  Also was noted to have large right-sided pleural effusion.  She was stabilized and then discharged to a skilled nursing facility on 9/28.    Patient readmitted for heart failure exacerbation with profound bilateral leg pain swelling abdominal pain and chest pain with respiratory distress with questionably acute concurrent pneumonia.  Patient was diuresed at intake given volume overload, appears that her symptoms are consistent with worsening cardiorenal syndrome given worsening volume status, worsening renal failure and inability to tolerate dialysis or aggressive diuresis.  Lengthy discussion daily with patient and family about prognosis, she remains full code despite our lengthy discussion that she continues to worsen, I discussed specifically with daughter [name] whether or not chest  compressions or intubation would be appropriate in a patient who cannot even tolerate dialysis, the family is going to continue to discuss this with outpatient palliative care and PCP as neither the patient nor family has made a formal decision yet.    Assessment & Plan:   Goals of care - Palliative care continues to follow, appreciate insight and recommendations  -There appears to be some confusion about palliative care/hospice and what that entails as the patient remains full code, will have palliative care continue to discuss goals of care with patient and family as patient's clinical condition continues to progress -Tentative plan to discharge back to facility for ongoing physical therapy, ultimately our concern is that the patient will continue to deteriorate while she remains full code, given patient's inability to tolerate dialysis it would be less than prudent to offer CPR and intubation but at this time the family continues to discuss goals of care with the patient.   Acute on chronic systolic CHF, stable - EF around 20 to 25%.   - Followed [facility] -we will continue to hold diuretics in the setting of profoundly worsening kidney function, given ongoing cardiorenal syndrome inability to tolerate dialysis or diuresis she will likely continue to deteriorate with worsening respiratory symptoms, edema and mental status changes.  At this time per patient and family request will discharge back to facility until family can arrange for discharge home - Patient remains off all antihypertensives and core measures at this time due to intolerance and hypotension in the setting of advancing cardiorenal syndrome  Acute on chronic kidney disease stage IV/history of renal transplant, worsening Electrolyte abnormalities secondary to above - Baseline creatinine 3 -3.5  - Creatinine continues to advance currently 5.3, no clear indication for daily labs at this point patient requesting labs once per  week which can be  done in the outpatient setting given regardless of creatinine it will not change our treatment plan at this time given she cannot tolerate dialysis or diuretics. -Lokelma prescribed for the next few days to offset rising potassium in the setting of worsening kidney function -Nephrology previously following but previously signed off given patient and family discussion surrounding likely intolerance of dialysis - Previous CT of the abdomen did not show any abnormalities of the renal transplant, renal ultrasound shows main transplant renal artery and vein are patent with normal grayscale appearance - On valganciclovir for history of CMV  Pneumonia, aspiration versus community-acquired, resolved - Completed antibiotic therapy - Patient continues to sat well into the 90s on room air, oxygen discontinued, reminded staff oxygen is to be used only for hypoxia not symptom management.   Epigastric abdominal pain, resolved - Abdomen benign today, previous imaging unremarkable   Acute metabolic encephalopathy, ongoing - Likely multifactorial given above infection, worsening creatinine/BUN  - High risk for sundowning given age and comorbidities  - Previous imaging of the head and brain unremarkable   History of paroxysmal atrial fibrillation, rate controlled -Transient bradycardia, beta-blockers held in the interim, will resume once indicated but heart rate remains well controlled in the 60s and 70s off metoprolol  - Home medication list does not show that she is on anticoagulation.  Noted to be just on aspirin.  She was on Eliquis previously but was not continued due to concern for GI bleed.   Pancytopenia - Chronic, remained stable - Likely secondary to SLE/CKD/chronic disease, She is on hydroxychloroquine and tacrolimus both of which can cause myelosuppression  History of SLE with history of lupus nephritis - On Plaquenil which was being continued.  Minimal to no improvement in  pancytopenia  Hypertension in the setting of chronic kidney disease stage IV  - Off medications given above AKI hypotension and bradycardia -We will resume BiDil given blood pressure minimally rising over the past 24 hours  History of coronary artery disease - Followed at [facility].  Continue aspirin.  Continue statin   Recent Pseudomonas UTI/pyelonephritis involving transplanted kidney - Recently completed 7-day course of IV antibiotics with meropenem.   Obesity - Estimated body mass index is 33.42 kg/m as calculated from the following: Height as of this encounter: $RemoveBeforeD'5\' 7"'[id number]$  (1.702 m). Weight as of this encounter: 96.8 kg.     Discharge Instructions  Discharge Instructions     Increase activity slowly   Complete by: As directed       Allergies as of 11/24/2020       Reactions   Enalapril Maleate Anaphylaxis, Swelling, Other (See Comments)   Throat swells   Penicillins Other (See Comments)   Made patient lightheaded PATIENT HAS HAD A PCN REACTION WITH IMMEDIATE RASH, FACIAL/TONGUE/THROAT SWELLING, SOB, OR LIGHTHEADEDNESS WITH HYPOTENSION:  #  #  YES  #  #  Has patient had a PCN reaction causing severe rash involving mucus membranes or skin necrosis: No Has patient had a PCN reaction that required hospitalization: No Has patient had a PCN reaction occurring within the last 10 years: No If all of the above answers are "NO", then may proceed with Cephalosporin use.   Chocolate Nausea And Vomiting   Tape Itching, Rash, Other (See Comments)        Medication List     STOP taking these medications    metoprolol succinate 25 MG 24 hr tablet Commonly known as: TOPROL-XL   torsemide 20 MG tablet Commonly known as: DEMADEX  TAKE these medications    acetaminophen 500 MG tablet Commonly known as: TYLENOL Take 1,000 mg by mouth every 6 (six) hours as needed for headache.   ALPRAZolam 0.25 MG tablet Commonly known as: XANAX Take 1 tablet (0.25 mg total)  by mouth at bedtime as needed for anxiety.   aspirin 81 MG EC tablet Take 81 mg by mouth every morning.   citalopram 10 MG tablet Commonly known as: CeleXA Take 1 tablet (10 mg total) by mouth daily.   gabapentin 100 MG capsule Commonly known as: NEURONTIN Take 1 capsule (100 mg total) by mouth at bedtime.   hydroxychloroquine 200 MG tablet Commonly known as: PLAQUENIL Take 1 tablet (200 mg total) by mouth 2 (two) times daily.   isosorbide-hydrALAZINE 20-37.5 MG tablet Commonly known as: BIDIL Take 1 tablet by mouth 3 (three) times daily.   Lokelma 5 g packet Generic drug: sodium zirconium cyclosilicate Take 5 g by mouth daily for 10 days. Follow potassium in the next 3-5 days; adjust accordingly   nitroGLYCERIN 0.4 MG SL tablet Commonly known as: NITROSTAT Place 0.4 mg under the tongue every 5 (five) minutes as needed for chest pain.   ondansetron 4 MG tablet Commonly known as: ZOFRAN Take 1 tablet (4 mg total) by mouth every 6 (six) hours as needed for nausea. What changed: reasons to take this   oxyCODONE 5 MG immediate release tablet Commonly known as: Oxy IR/ROXICODONE Take 1 tablet (5 mg total) by mouth every 6 (six) hours as needed for up to 3 days for moderate pain.   pantoprazole 40 MG tablet Commonly known as: PROTONIX Take 1 tablet (40 mg total) by mouth 2 (two) times daily before a meal. What changed: when to take this   predniSONE 5 MG tablet Commonly known as: DELTASONE Take 5 mg by mouth daily.   rosuvastatin 5 MG tablet Commonly known as: CRESTOR Take 5 mg by mouth daily at 6 PM.   simethicone 80 MG chewable tablet Commonly known as: MYLICON Chew 80 mg by mouth every 6 (six) hours as needed for flatulence.   sodium bicarbonate 650 MG tablet Take 1 tablet (650 mg total) by mouth 3 (three) times daily.   tacrolimus 0.5 MG capsule Commonly known as: PROGRAF Take 1 capsule (0.5 mg total) by mouth at bedtime.   tacrolimus 0.5 MG  capsule Commonly known as: PROGRAF Take 1 capsule (0.5 mg total) by mouth daily.   valGANciclovir 450 MG tablet Commonly known as: VALCYTE Take 450 mg by mouth every other day.        Contact information for after-discharge care     Destination     [facility] Preferred SNF .   Service: Skilled Nursing Contact information: [address] [address] [phone]                    Allergies  Allergen Reactions   Enalapril Maleate Anaphylaxis, Swelling and Other (See Comments)    Throat swells   Penicillins Other (See Comments)    Made patient lightheaded PATIENT HAS HAD A PCN REACTION WITH IMMEDIATE RASH, FACIAL/TONGUE/THROAT SWELLING, SOB, OR LIGHTHEADEDNESS WITH HYPOTENSION:  #  #  YES  #  #  Has patient had a PCN reaction causing severe rash involving mucus membranes or skin necrosis: No Has patient had a PCN reaction that required hospitalization: No Has patient had a PCN reaction occurring within the last 10 years: No If all of the above answers are "  NO", then may proceed with Cephalosporin use.    Chocolate Nausea And Vomiting   Tape Itching, Rash and Other (See Comments)    Consultations: Nephrology   Procedures/Studies: CT ABDOMEN PELVIS WO CONTRAST  Result Date: 11/01/2020 CLINICAL DATA:  Right lower quadrant pain. Evaluate for appendicitis. EXAM: CT ABDOMEN AND PELVIS WITHOUT CONTRAST TECHNIQUE: Multidetector CT imaging of the abdomen and pelvis was performed following the standard protocol without IV contrast. COMPARISON:  CT scan October 31, 2017 FINDINGS: Lower chest: Small bilateral pleural effusions are identified. There may be a loculated component of pleural fluid anteriorly on the right. Three-vessel coronary artery disease identified. Mild cardiomegaly. The lung bases are normal. Hepatobiliary: The liver is normal in appearance. The gallbladder appears to be thick walled with adjacent stranding. The  gallbladder appears to contain high attenuation material. Pancreas: Unremarkable. No pancreatic ductal dilatation or surrounding inflammatory changes. Spleen: Normal in size without focal abnormality. Adrenals/Urinary Tract: The adrenal glands are unremarkable. The native kidneys are atrophic bilaterally. No hydronephrosis or acute perinephric stranding identified. Vascular calcifications are noted. No suspicious masses. No ureterectasis or stones associated with the native kidneys. A transplant kidney is seen in the right side of the pelvis with no significant adjacent fat stranding identified. There is mild prominence of the transplant kidneys pelvis, calices, and ureter without an identified stone. There is increased attenuation in the fat adjacent to the transplant kidneys renal pelvis. The bladder is unremarkable. Stomach/Bowel: The stomach and small bowel are normal. Scattered colonic diverticuli are seen without diverticulitis. The appendix is normal. Vascular/Lymphatic: Atherosclerotic change is seen in the nonaneurysmal aorta, iliac vessels, and femoral vessels. Reproductive: Uterus and bilateral adnexa are unremarkable. Other: Increased attenuation throughout the subcutaneous fat diffusely consistent with edema. A small amount of free fluid is seen in the pelvis. Musculoskeletal: No acute or significant osseous findings. IMPRESSION: 1. The patient has a transplanted kidney in the right pelvis. There is mild associated pelvicaliectasis which is age indeterminate. There is mild increased attenuation in the fat adjacent to the right renal pelvis. The right ureter is prominent but no stones are seen. The findings are age indeterminate and of uncertain etiology. Recommend clinical correlation. 2. The gallbladder wall is thickened. The gallbladder contains high attenuation material. Fat stranding adjacent to the gallbladder. Recommend ultrasound for better evaluation. 3. The appendix is normal in appearance. 4.  Bilateral pleural effusions. 5. Diffuse soft tissue edema.  Mild ascites in the pelvis. 6. Atherosclerotic change in the nonaneurysmal aorta, iliac vessels, and femoral vessels. Coronary artery disease. 7. Scattered colonic diverticuli without diverticulitis. Electronically Signed   By: Dorise Bullion III M.D.   On: 11/01/2020 14:56   DG Chest 2 View  Result Date: 11/13/2020 CLINICAL DATA:  Chest pain and shortness of breath. Bilateral leg pain and swelling. EXAM: CHEST - 2 VIEW COMPARISON:  11/10/2020 FINDINGS: Persistent airspace opacities in the right middle lobe both lower lobes. The airspace opacity in the left lower lobe and in the right middle lobe is mildly worsened, with a reduced amount of aeration in the involved regions. There is at least mild enlargement of the cardiopericardial silhouette. Mild prominence of upper zone pulmonary vasculature may reflect pulmonary venous hypertension. Linear subsegmental atelectasis in the left mid lung appears stable. IMPRESSION: 1. Worsened bibasilar airspace opacities, suspicious for pneumonia or aspiration. 2. Cardiomegaly with pulmonary venous hypertension. Electronically Signed   By: Van Clines M.D.   On: 11/13/2020 14:54   DG Chest 2 View  Result Date: 11/06/2020  CLINICAL DATA:  Shortness of breath EXAM: CHEST - 2 VIEW COMPARISON:  11/01/2020 FINDINGS: Moderate to large right pleural effusion and associated atelectasis or consolidation, increased compared to prior examination. Mild diffuse interstitial pulmonary opacity. Unchanged gross cardiomegaly. IMPRESSION: 1. Moderate to large right pleural effusion and associated atelectasis or consolidation, increased compared to prior examination. 2. Cardiomegaly and diffuse bilateral interstitial pulmonary opacity, likely edema. Electronically Signed   By: Eddie Candle M.D.   On: 11/06/2020 13:04   DG Abd 1 View  Result Date: 11/17/2020 CLINICAL DATA:  Abdominal pain, history kidney stones, initial  encounter EXAM: ABDOMEN - 1 VIEW COMPARISON:  11/13/2020 CT FINDINGS: Scattered large and small bowel gas is noted. No abnormal mass or abnormal calcifications are seen. Calcifications are noted in the pelvis consistent with known uterine fibroids. No bony abnormality is seen. IMPRESSION: No acute abnormality noted. Electronically Signed   By: Inez Catalina M.D.   On: 11/17/2020 20:50   CT HEAD WO CONTRAST (5MM)  Result Date: 11/13/2020 CLINICAL DATA:  Dizziness Left-sided weakness EXAM: CT HEAD WITHOUT CONTRAST TECHNIQUE: Contiguous axial images were obtained from the base of the skull through the vertex without intravenous contrast. COMPARISON:  None. FINDINGS: Brain: No acute intracranial hemorrhage. No midline shift or hydrocephalus. Interval development of low-density in the right occipital lobe white matter. Vascular: No hyperdense vessel or unexpected calcification. Skull: Normal. Negative for fracture or focal lesion. Sinuses/Orbits: No acute finding. Other: None. IMPRESSION: 1. No acute intracranial hemorrhage. 2. Interval development of white matter hypodensity in the right occipital lobe. While this may be due to interval infarct, findings may also be the result of vasogenic edema related to underlying mass. Further evaluation with contrast-enhanced MRI of the brain should be performed when patient condition allows. Electronically Signed   By: Miachel Roux M.D.   On: 11/13/2020 15:51   MR Brain W and Wo Contrast  Result Date: 11/13/2020 CLINICAL DATA:  Dizziness and possible brain mass EXAM: MRI HEAD WITHOUT AND WITH CONTRAST TECHNIQUE: Multiplanar, multiecho pulse sequences of the brain and surrounding structures were obtained without and with intravenous contrast. CONTRAST:  48mL GADAVIST GADOBUTROL 1 MMOL/ML IV SOLN COMPARISON:  Head CT 11/13/2020 Brain MRI 05/05/2013 FINDINGS: Brain: No acute infarct, mass effect or extra-axial collection. No acute or chronic hemorrhage. There is hyperintense  T2-weighted signal in the posterior right parietal lobe, likely an old infarct. Mild periventricular white matter hyperintense T2-weighted signal. The midline structures are normal. Vascular: Major flow voids are preserved. Skull and upper cervical spine: Normal calvarium and skull base. Visualized upper cervical spine and soft tissues are normal. Sinuses/Orbits:No paranasal sinus fluid levels or advanced mucosal thickening. No mastoid or middle ear effusion. Normal orbits. IMPRESSION: 1. No acute intracranial abnormality.  No mass lesion. 2. 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 Electronically Signed   By: Ulyses Jarred M.D.   On: 11/13/2020 21:25   US Renal Transplant w/Doppler  Result Date: 11/19/2020 CLINICAL DATA:  Acute kidney injury EXAM: ULTRASOUND OF RENAL TRANSPLANT WITH RENAL DOPPLER ULTRASOUND TECHNIQUE: Ultrasound examination of the renal transplant was performed with gray-scale, color and duplex doppler evaluation. COMPARISON:  11/03/2020 FINDINGS: Transplant kidney location: Right lower quadrant Transplant Kidney: Renal measurements: 10.7 x 5.1 x 5.8 cm = volume: 114mL. Normal in size and parenchymal echogenicity. No evidence of mass or hydronephrosis. No peri-transplant fluid collection seen. Color flow in the main renal artery:  Yes Color flow in the main renal vein:  Yes  Duplex Doppler Evaluation: Main Renal Artery Velocity: 87 cm/sec Main Renal Artery Resistive Index: 0.87 Venous waveform in main renal vein:  Present Intrarenal resistive index in upper pole:  0.79 (normal 0.6-0.8; equivocal 0.8-0.9; abnormal >= 0.9) Intrarenal resistive index in lower pole: 0.80 (normal 0.6-0.8; equivocal 0.8-0.9; abnormal >= 0.9) Bladder: Minimally dilated. No significant abnormality of the bladder. Other findings:  None. IMPRESSION: 1. Intra-renal resistive indices within normal limits. 2. Main transplant renal artery and renal vein are patent. 3. Normal  grayscale appearance of the transplant kidney. Electronically Signed   By: Miachel Roux M.D.   On: 11/19/2020 08:17   US Renal Transplant w/Doppler  Result Date: 11/03/2020 CLINICAL DATA:  Acute kidney injury and status post prior renal transplant in 2020. EXAM: ULTRASOUND OF RENAL TRANSPLANT WITH RENAL DOPPLER ULTRASOUND TECHNIQUE: Ultrasound examination of the renal transplant was performed with gray-scale, color and duplex doppler evaluation. COMPARISON:  CT of the abdomen and pelvis without contrast on 11/01/2020. Imaging from prior transplant renal ultrasound on [facility]/13/2020 [facility]. FINDINGS: Transplant kidney location: RLQ Transplant Kidney: Renal measurements: 10.6 x 4.8 x 4.2 cm = volume: 112 mL. Normal in size and parenchymal echogenicity. No evidence of mass or hydronephrosis. No peri-transplant fluid collection seen. Color flow in the main renal artery:  Yes Color flow in the main renal vein:  Yes Duplex Doppler Evaluation: Main Renal Artery Velocity: 70 cm/sec Main Renal Artery Resistive Index: 0.81 Venous waveform in main renal vein:  Present Intrarenal resistive index in upper pole:  0.61 (normal 0.6-0.8; equivocal 0.8-0.9; abnormal >= 0.9) Intrarenal resistive index in lower pole: 0.62 (normal 0.6-0.8; equivocal 0.8-0.9; abnormal >= 0.9) Bladder: Normal for degree of bladder distention. Other findings: No free fluid around the transplant kidney or focal fluid collection. IMPRESSION: Unremarkable ultrasound of a transplant kidney in the right pelvis. Electronically Signed   By: Aletta Edouard M.D.   On: 11/03/2020 16:28   MR LIVER WO CONRTAST  Result Date: 11/03/2020 CLINICAL DATA:  History of end-stage renal disease EXAM: MRI ABDOMEN WITHOUT CONTRAST TECHNIQUE: Multiplanar multisequence MR imaging was performed without the administration of intravenous contrast. COMPARISON:  Abdominal ultrasound dated November 01, 2020 FINDINGS: Lower chest: Cardiomegaly. No pericardial effusion. Small  bilateral pleural effusions and atelectasis. Hepatobiliary: Marked signal loss is seen throughout the liver on T2 imaging with increased signal on out of phase imaging. Can not evaluate hepatic lesion due to lack of IV contrast. Pancreas: No inflammatory changes or other parenchymal abnormality identified. Spleen:  Normal size.  Diffuse signal loss on T2 imaging. Adrenals/Urinary Tract: Atrophic native kidneys. No evidence of hydronephrosis. T2 hyperintense lesion of the upper pole of the left kidney, likely a simple cyst T1 hyperintense lesion at the mid region of the right kidney, likely a proteinaceous or hemorrhagic cyst, although evaluation is limited due to lack contrast. Right lower quadrant renal transplant with no evidence of hydronephrosis. Stomach/Bowel: Visualized portions within the abdomen are unremarkable. Vascular/Lymphatic: No pathologically enlarged lymph nodes identified. No abdominal aortic aneurysm demonstrated. Other:  Diffuse soft tissue anasarca and trace abdominal ascites. Musculoskeletal: Diffuse signal loss on T2 imaging. No suspicious bone lesions identified. IMPRESSION: Liver lesion which was seen on prior ultrasound is not visualized, likely due to lack of IV contrast. If multiphase contrast-enhanced exam of the liver can not be performed, follow-up ultrasound is recommended in 3-6 months to assess stability. Diffuse wall thickening of the gallbladder, likely secondary to systemic process such as heart failure. If there is clinical concern for  acute cholecystitis HIDA scan could be performed. Liver and spleen demonstrate diffuse signal loss on T2 imaging, compatible with secondary hemochromatosis. Diffuse soft tissue anasarca and trace abdominal ascites. Electronically Signed   By: Yetta Glassman M.D.   On: 11/03/2020 12:30   DG CHEST PORT 1 VIEW  Result Date: 11/17/2020 CLINICAL DATA:  Dyspnea EXAM: PORTABLE CHEST 1 VIEW COMPARISON:  11/13/2020 FINDINGS: Stable cardiomegaly with  mild pulmonary vascular congestion. Small bilateral pleural effusions, right slightly greater than left. Dense airspace opacity in the right lower lobe, slightly worsened from prior. Patchy left basilar opacity with slightly improved aeration in the left lung base compared to prior. No pneumothorax. IMPRESSION: 1. Dense airspace opacity in the right lower lobe, slightly worsened from prior. 2. Patchy left basilar opacity with slightly improved aeration in the left lung base compared to prior. 3. Cardiomegaly with pulmonary vascular congestion and small bilateral pleural effusions. Electronically Signed   By: Davina Poke D.O.   On: 11/17/2020 15:05   DG CHEST PORT 1 VIEW  Result Date: 11/10/2020 CLINICAL DATA:  Shortness of breath.  History of renal failure. EXAM: PORTABLE CHEST 1 VIEW COMPARISON:  11/06/2020 FINDINGS: The cardiac silhouette remains enlarged. There are small to moderate bilateral pleural effusions, smaller on the right compared to the prior study but new on the left. There are bibasilar airspace opacities which have increased on the left and improved on the right. No pneumothorax is identified. IMPRESSION: Small to moderate bilateral pleural effusions with bibasilar atelectasis or consolidation, improved on the right and increased on the left. Electronically Signed   By: Logan Bores M.D.   On: 11/10/2020 06:56   DG Chest Portable 1 View  Result Date: 11/01/2020 CLINICAL DATA:  Pleural effusion EXAM: PORTABLE CHEST 1 VIEW COMPARISON:  10/15/2020, same day CT abdomen pelvis, 08/31/2020 FINDINGS: Gross cardiomegaly. Small left pleural effusion and associated atelectasis or consolidation, not significant changed in appearance compared to prior radiographs dated 08/31. The visualized skeletal structures are unremarkable. IMPRESSION: 1.  Gross cardiomegaly. 2. Small left pleural effusion and associated atelectasis or consolidation, not significantly changed in appearance compared to prior  radiographs dated 08/31. Electronically Signed   By: Eddie Candle M.D.   On: 11/01/2020 15:40   CT Renal Stone Study  Result Date: 11/13/2020 CLINICAL DATA:  Bilateral leg pain and swelling that began 2-3 hours, abdominal pain, nausea EXAM: CT ABDOMEN AND PELVIS WITHOUT CONTRAST TECHNIQUE: Multidetector CT imaging of the abdomen and pelvis was performed following the standard protocol without IV contrast. COMPARISON:  CT abdomen/pelvis 11/01/2020, MR abdomen 11/03/2020 FINDINGS: Lower chest: There are bilateral pleural effusions, increased since 11/01/2020. Patchy opacities in the left lower lobe are increased since the prior study are suspicious for infection or aspiration. The heart is enlarged. Coronary artery calcifications are again seen. Hepatobiliary: The liver is unremarkable, within the confines of noncontrast technique. The previously reported abnormality seen on the abdominal ultrasound from 11/01/2020 is not identified. The gallbladder is contracted but again demonstrates a thickened wall and mild pericholecystic fat stranding. Pancreas: Unremarkable. Spleen: Unremarkable. Adrenals/Urinary Tract: The adrenals are unremarkable. The native kidneys are atrophic, with no hydronephrosis or hydroureter. A transplant kidney is again seen in the right lower quadrant. There is prominence of the transplant ureter and pelvis with mild stranding in the surrounding fat. There are no focal lesions or stones seen in the transplant kidney or ureter. Overall, the findings are similar to the study from 11/01/2020. The bladder is decompressed but grossly unremarkable. Stomach/Bowel:  The stomach is unremarkable. There is no evidence of bowel obstruction. There is no evidence of abnormal bowel wall thickening or inflammatory change. There is fluid in the distal colon/rectum. There are scattered colonic diverticula without evidence of acute diverticulitis. Vascular/Lymphatic: There is calcified atherosclerotic plaque  throughout the nonaneurysmal abdominal aorta. There is no abdominal or pelvic lymphadenopathy. Reproductive: A calcification in the uterus likely reflects a fibroid. There is no adnexal mass. Other: There is trace free fluid in the pelvis, nonspecific and not significantly changed. There is no free air. There is diffuse body wall edema, similar to the prior study. Musculoskeletal: There is no acute osseous abnormality or aggressive osseous lesion. IMPRESSION: 1. Overall, no new acute finding in the abdomen or pelvis compared to the study from 11/01/2020. 2. Similar appearance of the transplant kidney in the right lower quadrant with unchanged prominence of the pelvis and ureter with no stone or other obstructing lesion seen. There is mild stranding in the surrounding fat, also similar to the prior study. Correlate with symptoms and laboratory values if there is concern for pyelonephritis. 3. The gallbladder again demonstrates mild wall thickening and pericholecystic fat stranding. This is again nonspecific and could be due to heart failure. This could be evaluated with HIDA scan if there is clinical concern for acute cholecystitis. 4. Bilateral pleural effusions, increased since 11/01/2020, with increased patchy opacities in the left base suspicious for infection or aspiration. 5. Fluid in the colon which can be seen in the setting of diarrhea. 6. Diffuse body wall edema and trace free fluid in the pelvis, similar to the prior study. 7. Unchanged cardiomegaly. 8.  Aortic Atherosclerosis (ICD10-I70.0). Electronically Signed   By: Valetta Mole M.D.   On: 11/13/2020 15:59   US Abdomen Limited RUQ (LIVER/GB)  Result Date: 11/01/2020 CLINICAL DATA:  Abnormality seen on CT imaging. Right upper quadrant pain. EXAM: ULTRASOUND ABDOMEN LIMITED RIGHT UPPER QUADRANT COMPARISON:  CT scan November 01, 2020 FINDINGS: Gallbladder: Gallbladder wall is thickened to 1.1 cm. No stones, sludge, or Murphy's sign. Common bile duct:  Diameter: The common bile duct measures 2 mm on limited imaging/visualization. Liver: A hyperechoic mass is seen in the liver measuring 1.3 x 1.1 x 1.0 cm. No other abnormalities. Portal vein is patent on color Doppler imaging with normal direction of blood flow towards the liver. Other: None. IMPRESSION: 1. The gallbladder wall is thickened to 1.1 cm. No stones, sludge, or Murphy's sign. The gallbladder wall thickening is a nonspecific finding given the absence of other findings. If the clinical picture remains ambiguous, a HIDA scan could further evaluate. 2. Evaluation of the common bile duct is limited but the visualized portion is normal in caliber. 3. The hyperechoic mass in the liver measuring up to 1.3 cm is nonspecific but most likely a hemangioma. This mass is somewhat irregular in shape. Recommend either a CT scan with contrast and delayed imaging for confirmation or an MRI. Electronically Signed   By: Dorise Bullion III M.D.   On: 11/01/2020 16:38     Subjective: No acute issues or events overnight, requesting discharge back to facility which is certainly reasonable given limited treatment options, close follow-up with palliative care as discussed in the next 24 to 48 hours once patient and family have had more time to discuss prognosis and goals of care.   Discharge Exam: Vitals:   11/23/20 1945 11/24/20 0554  BP: (!) 161/106 (!) 158/115  Pulse: 71 72  Resp: 18 20  Temp: 98 F (  36.7 C) 97.8 F (36.6 C)  SpO2: 95% 100%   Vitals:   11/23/20 1731 11/23/20 1945 11/24/20 0554 11/24/20 0700  BP: (!) 151/85 (!) 161/106 (!) 158/115   Pulse: 72 71 72   Resp: $Remo'16 18 20   'PoJgV$ Temp: 97.6 F (36.4 C) 98 F (36.7 C) 97.8 F (36.6 C)   TempSrc: Axillary     SpO2: 100% 95% 100%   Weight:    94.2 kg  Height:        General:  Pleasantly resting in bed, No acute distress.  Awake alert oriented x4 but somewhat delayed answering questions HEENT:  Normocephalic atraumatic.  Sclerae nonicteric,  noninjected.  Extraocular movements intact bilaterally. Neck:  Without mass or deformity.  Trachea is midline. Lungs:  Clear to auscultate bilaterally without rhonchi, wheeze, or rales. Heart:  Regular rate and rhythm.  Without murmurs, rubs, or gallops. Abdomen:  Soft, nontender, nondistended.  Without guarding or rebound. Extremities: Without cyanosis, clubbing, diffuse 3+ pitting edema bilateral lower extremities Vascular:  Dorsalis pedis and posterior tibial pulses palpable bilaterally. Skin:  Warm and dry, no erythema  The results of significant diagnostics from this hospitalization (including imaging, microbiology, ancillary and laboratory) are listed below for reference.     Microbiology: Recent Results (from the past 240 hour(s))  MRSA Next Gen by PCR, Nasal     Status: None   Collection Time: 11/14/20  6:00 PM   Specimen: Nasal Mucosa; Nasal Swab  Result Value Ref Range Status   MRSA by PCR Next Gen NOT DETECTED NOT DETECTED Final    Comment: RESULT CALLED TO, READ BACK BY AND VERIFIED WITH: SCOTPON J $RemoveB'@0535'[id number]$  BY BATTLET Performed at [facility] [address] [place], [place] [address]   Resp Panel by RT-PCR (Flu A&B, Covid)     Status: None   Collection Time: 11/24/20 11:59 AM  Result Value Ref Range Status   SARS Coronavirus 2 by RT PCR NEGATIVE NEGATIVE Final    Comment: (NOTE) SARS-CoV-2 target nucleic acids are NOT DETECTED.  The SARS-CoV-2 RNA is generally detectable in upper respiratory specimens during the acute phase of infection. The lowest concentration of SARS-CoV-2 viral copies this assay can detect is 138 copies/mL. A negative result does not preclude SARS-Cov-2 infection and should not be used as the sole basis for treatment or other patient management decisions. A negative result may occur with  improper specimen collection/handling, submission of specimen other than nasopharyngeal swab, presence of viral mutation(s) within the areas  targeted by this assay, and inadequate number of viral copies(<138 copies/mL). A negative result must be combined with clinical observations, patient history, and epidemiological information. The expected result is Negative.  Fact Sheet for Patients:  EntrepreneurPulse.com.au  Fact Sheet for Healthcare Providers:  IncredibleEmployment.be  This test is no t yet approved or cleared by the [place] FDA and  has been authorized for detection and/or diagnosis of SARS-CoV-2 by FDA under an Emergency Use Authorization (EUA). This EUA will remain  in effect (meaning this test can be used) for the duration of the COVID-19 declaration under Section 564(b)(1) of the Act, 21 U.S.C.section 360bbb-3(b)(1), unless the authorization is terminated  or revoked sooner.       Influenza A by PCR NEGATIVE NEGATIVE Final   Influenza B by PCR NEGATIVE NEGATIVE Final    Comment: (NOTE) The Xpert Xpress SARS-CoV-2/FLU/RSV plus assay is intended as an aid in the diagnosis of influenza from Nasopharyngeal swab specimens and should not be used  as a sole basis for treatment. Nasal washings and aspirates are unacceptable for Xpert Xpress SARS-CoV-2/FLU/RSV testing.  Fact Sheet for Patients: BloggerCourse.com  Fact Sheet for Healthcare Providers: SeriousBroker.it  This test is not yet approved or cleared by the [place] FDA and has been authorized for detection and/or diagnosis of SARS-CoV-2 by FDA under an Emergency Use Authorization (EUA). This EUA will remain in effect (meaning this test can be used) for the duration of the COVID-19 declaration under Section 564(b)(1) of the Act, 21 U.S.C. section 360bbb-3(b)(1), unless the authorization is terminated or revoked.  Performed at [facility], [address], [place], [place] [address]      Labs: BNP (last 3 results) Recent Labs     11/06/20 1246 11/10/20 0223 11/13/20 1442  BNP >4,500.0* >4,500.0* >4,500.0*   Basic Metabolic Panel: Recent Labs  Lab 11/18/20 0355 11/19/20 0355 11/20/20 0806 11/21/20 0827 11/22/20 0429 11/24/20 0359  NA 138 133* 131* 142 135 133*  K 4.4 4.8 5.0 5.6* 4.7 5.5*  CL 108 101 102 107 104 105  CO2 20* 19* 17* 20* 17* 17*  GLUCOSE 84 101* 89 80 109* 79  BUN 96* 97* 105* 109* 112* 109*  CREATININE 4.48* 4.64* 5.02* 5.22* 5.25* 5.37*  CALCIUM 9.3 9.4 9.1 10.0 8.9 9.3  PHOS 6.3* 5.5* 5.6* 6.4*  --   --    Liver Function Tests: Recent Labs  Lab 11/17/20 1925 11/18/20 0355 11/19/20 0355 11/20/20 0806 11/21/20 0827  AST 28 27  --   --   --   ALT 12 11  --   --   --   ALKPHOS 96 87  --   --   --   BILITOT 1.3* 1.2  --   --   --   PROT 6.3* 6.0*  --   --   --   ALBUMIN 3.0* 2.9*  3.0* 3.0* 3.0* 3.4*   Recent Labs  Lab 11/17/20 1925 11/18/20 0355  LIPASE 43 34   No results for input(s): AMMONIA in the last 168 hours. CBC: Recent Labs  Lab 11/19/20 0652 11/20/20 0806 11/22/20 0429  WBC 2.0* 1.6* 1.7*  NEUTROABS 0.8*  --   --   HGB 10.9* 10.8* 11.5*  HCT 34.4* 33.1* 35.6*  MCV 91.5 89.2 90.4  PLT 69* 67* 81*   Cardiac Enzymes: No results for input(s): CKTOTAL, CKMB, CKMBINDEX, TROPONINI in the last 168 hours. BNP: Invalid input(s): POCBNP CBG: No results for input(s): GLUCAP in the last 168 hours. D-Dimer No results for input(s): DDIMER in the last 72 hours. Hgb A1c No results for input(s): HGBA1C in the last 72 hours. Lipid Profile No results for input(s): CHOL, HDL, LDLCALC, TRIG, CHOLHDL, LDLDIRECT in the last 72 hours. Thyroid function studies No results for input(s): TSH, T4TOTAL, T3FREE, THYROIDAB in the last 72 hours.  Invalid input(s): FREET3 Anemia work up No results for input(s): VITAMINB12, FOLATE, FERRITIN, TIBC, IRON, RETICCTPCT in the last 72 hours. Urinalysis    Component Value Date/Time   COLORURINE YELLOW 11/13/2020 1410    APPEARANCEUR CLEAR 11/13/2020 1410   LABSPEC 1.015 11/13/2020 1410   PHURINE 5.0 11/13/2020 1410   GLUCOSEU NEGATIVE 11/13/2020 1410   HGBUR NEGATIVE 11/13/2020 1410   BILIRUBINUR NEGATIVE 11/13/2020 1410   BILIRUBINUR neg 12/19/2014 1157   KETONESUR 5 (A) 11/13/2020 1410   PROTEINUR >=300 (A) 11/13/2020 1410   UROBILINOGEN 0.2 12/19/2014 1157   UROBILINOGEN 0.2 05/23/2014 1301   NITRITE NEGATIVE 11/13/2020 1410  LEUKOCYTESUR NEGATIVE 11/13/2020 1410   Sepsis Labs Invalid input(s): PROCALCITONIN,  WBC,  LACTICIDVEN Microbiology Recent Results (from the past 240 hour(s))  MRSA Next Gen by PCR, Nasal     Status: None   Collection Time: 11/14/20  6:00 PM   Specimen: Nasal Mucosa; Nasal Swab  Result Value Ref Range Status   MRSA by PCR Next Gen NOT DETECTED NOT DETECTED Final    Comment: RESULT CALLED TO, READ BACK BY AND VERIFIED WITH: SCOTPON J $RemoveB'@0535'[id number]$  BY BATTLET Performed at [facility] [address], [place], [place] [address]   Resp Panel by RT-PCR (Flu A&B, Covid)     Status: None   Collection Time: 11/24/20 11:59 AM  Result Value Ref Range Status   SARS Coronavirus 2 by RT PCR NEGATIVE NEGATIVE Final    Comment: (NOTE) SARS-CoV-2 target nucleic acids are NOT DETECTED.  The SARS-CoV-2 RNA is generally detectable in upper respiratory specimens during the acute phase of infection. The lowest concentration of SARS-CoV-2 viral copies this assay can detect is 138 copies/mL. A negative result does not preclude SARS-Cov-2 infection and should not be used as the sole basis for treatment or other patient management decisions. A negative result may occur with  improper specimen collection/handling, submission of specimen other than nasopharyngeal swab, presence of viral mutation(s) within the areas targeted by this assay, and inadequate number of viral copies(<138 copies/mL). A negative result must be combined with clinical observations, patient history, and  epidemiological information. The expected result is Negative.  Fact Sheet for Patients:  EntrepreneurPulse.com.au  Fact Sheet for Healthcare Providers:  IncredibleEmployment.be  This test is no t yet approved or cleared by the [place] FDA and  has been authorized for detection and/or diagnosis of SARS-CoV-2 by FDA under an Emergency Use Authorization (EUA). This EUA will remain  in effect (meaning this test can be used) for the duration of the COVID-19 declaration under Section 564(b)(1) of the Act, 21 U.S.C.section 360bbb-3(b)(1), unless the authorization is terminated  or revoked sooner.       Influenza A by PCR NEGATIVE NEGATIVE Final   Influenza B by PCR NEGATIVE NEGATIVE Final    Comment: (NOTE) The Xpert Xpress SARS-CoV-2/FLU/RSV plus assay is intended as an aid in the diagnosis of influenza from Nasopharyngeal swab specimens and should not be used as a sole basis for treatment. Nasal washings and aspirates are unacceptable for Xpert Xpress SARS-CoV-2/FLU/RSV testing.  Fact Sheet for Patients: EntrepreneurPulse.com.au  Fact Sheet for Healthcare Providers: IncredibleEmployment.be  This test is not yet approved or cleared by the [place] FDA and has been authorized for detection and/or diagnosis of SARS-CoV-2 by FDA under an Emergency Use Authorization (EUA). This EUA will remain in effect (meaning this test can be used) for the duration of the COVID-19 declaration under Section 564(b)(1) of the Act, 21 U.S.C. section 360bbb-3(b)(1), unless the authorization is terminated or revoked.  Performed at [facility], [place] [address], [place], [place] [address]      Time coordinating discharge: Over 30 minutes  SIGNED:   Little Ishikawa, DO [facility] 11/24/2020, 1:27 PM Pager   If 7PM-7AM, please contact night-coverage [email or web address]

## 2020-11-24 NOTE — TOC Transition Note (Signed)
Transition of Care [facility]) - CM/SW Discharge Note   Patient Details  Name: Jenna [name] MRN: [id number] Date of Birth: 01-10-1956  [facility] CM/SW Contact:  Ross Ludwig, LCSW Phone Number: 11/24/2020, 1:36 PM   Clinical Narrative:     Patient to be d/c'ed today to [facility] room 109.  Patient and family agreeable to plans will transport via ems RN to call report to [phone].  CSW spoke to patient's daughter [name] and she is aware that patient is discharging today.     Final next level of care: Skilled Nursing Facility Barriers to Discharge: Barriers Resolved   Patient Goals and CMS Choice Patient states their goals for this hospitalization and ongoing recovery are:: To go to SNF to continue with rehab. CMS Medicare.gov Compare Post Acute Care list provided to:: Patient Choice offered to / list presented to : Patient  Discharge Placement   Existing PASRR number confirmed : 11/24/20          Patient chooses bed at: [facility] Patient to be transferred to facility by: Patient aware of planned discharge today.   Patient and family notified of of transfer: 11/24/20  Discharge Plan and Services   Discharge Planning Services: CM Consult                                 Social Determinants of Health (SDOH) Interventions     Readmission Risk Interventions Readmission Risk Prevention Plan 11/18/2020 11/05/2020 08/26/2020  Transportation Screening Complete Complete Complete  PCP or Specialist Appt within 3-5 Days - Complete -  [place] or [place] - Complete Complete  Social Work Consult for [place] Planning/Counseling - Complete Complete  Palliative Care Screening - Not Applicable Not Applicable  Medication Review Press photographer) Complete Complete Complete  PCP or Specialist appointment within 3-5 days of discharge Complete - -  [place] or Home Care Consult Complete - -  SW Recovery Care/Counseling Consult  Complete - -  Palliative Care Screening Not Applicable - -  [place] Not Complete - -  SNF Comments possible placement - -  Some recent data might be hidden

## 2020-11-25 ENCOUNTER — Telehealth: Payer: Self-pay | Admitting: *Deleted

## 2020-11-25 ENCOUNTER — Telehealth: Payer: Medicare Other | Admitting: *Deleted

## 2020-11-25 NOTE — Telephone Encounter (Signed)
  Care Management   Follow Up Note   11/25/2020 Name: ADY HEIMANN MRN: [id number] DOB: 03/19/1955   Referred by: [name], Betty G, MD Reason for referral : Chronic Care Management in patient with CHF, Stage 5 CKD, HTN, Lupus, HLD, PAF, Anxiety Disorder, TIA, CAD, Acute Respiratory Failure.  An unsuccessful telephone outreach was attempted today. The patient was referred to the case management team for assistance with care management and care coordination. A HIPAA compliant message was left on voicemail for patient, including contact information, encouraging patient to return LCSW's call at her earliest convenience.  After thorough chart review, LCSW noted that patient was placed at [facility] [place] # 109, on 11/24/2020, upon discharge from the hospital, to receive short-term rehabilitative services.  Follow-Up Plan:  LCSW will place a referral to the [facility] to reschedule a follow-up appointment with LCSW as soon as patient is discharged back into the community.  Nat Christen LCSW Licensed Clinical Social Worker [place] [phone]

## 2020-11-26 NOTE — Telephone Encounter (Signed)
error 

## 2020-11-28 ENCOUNTER — Non-Acute Institutional Stay: Payer: Medicare Other | Admitting: Hospice

## 2020-11-28 ENCOUNTER — Other Ambulatory Visit: Payer: Self-pay

## 2020-11-28 DIAGNOSIS — M6281 Muscle weakness (generalized): Secondary | ICD-10-CM

## 2020-11-28 DIAGNOSIS — Z515 Encounter for palliative care: Secondary | ICD-10-CM

## 2020-11-28 DIAGNOSIS — F339 Major depressive disorder, recurrent, unspecified: Secondary | ICD-10-CM

## 2020-11-28 DIAGNOSIS — N185 Chronic kidney disease, stage 5: Secondary | ICD-10-CM

## 2020-11-28 NOTE — Progress Notes (Signed)
[facility] [facility] Palliative Care Consult Note Telephone: [phone]  Fax: [phone]  PATIENT NAME: Jenna [name] [address] [phone](home)  DOB: 803-Aug-1957MRN: [id number] PRIMARY CARE PROVIDER:    JMartinique Betty G, MD,  [phone] REFERRING PROVIDER:   [place] [address] [address]  [place] [phone] RESPONSIBLE PARTY:   Self LGrayce Sessions- patient's brother Contact Information     Name Relation Home Work Mobile   FManisteeDaughter [phone]     I met face to face with patient and family facility. Palliative Care was asked to follow this patient by consultation request of  JMartinique BMalka So MD to address advance care planning, complex medical decision making and goals of care clarification. LGrayce Sessionsis present with patient during visit. This is the initial visit.    ASSESSMENT AND / RECOMMENDATIONS:   Advance Care Planning: Our advance care planning conversation included a discussion about:    The value and importance of advance care planning  Difference between Hospice and Palliative care Exploration of goals of care in the event of a sudden injury or illness  Identification and preparation of a healthcare agent  Review and updating or creation of an  advance directive document . Decision not to resuscitate or to de-escalate disease focused treatments due to poor prognosis.  CODE STATUS: Ramifications and implications of CODE STATUS discussed.  Patient affirmed she is Full Code.   Goals of Care: Goals include to maximize quality of life and symptom management  I spent 20 minutes providing this initial consultation. More than 50% of the time in this consultation was spent on counseling patient and coordinating  communication. --------------------------------------------------------------------------------------------------------------------------------------  Symptom Management/Plan: Generalized muscle weakness: PT/OT is ongoing for strengthening. 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  Nephrologist consult as needed.  Routine CBC BMP Depression: Managed with Celexa.  Encouraged to participate in facility activities.  Psych consult as needed. Follow up: Palliative care will continue to follow for complex medical decision making, advance care planning, and clarification of goals. Return 6 weeks or prn.Encouraged to call provider sooner with any concerns.   Family /Caregiver/Community Supports: Patient in SNF for ongoing care.  HOSPICE ELIGIBILITY/DIAGNOSIS: TBD  Chief Complaint: Initial Palliative care visit  HISTORY OF PRESENT ILLNESS:  Jenna PARADISEis a 65y.o. year old female  with multiple medical conditions including worsening weakness related to recent hospitalization  9/29- 11/24/20 for heart failure exacerbation with associated bilateral leg swelling, respiratory distress.  She was diuresed stabilized, and discharged to SNF for ongoing care.  Patient reports weakness is worse during activities; resting is helpful for her.  She denies pain/discomfort, no respiratory distress.  Patient with history of CKD4; epic chart indicates inability to tolerate dialysis.  History of depression, CKD4, systolic CHF EF 217-40with cardiorenal syndrome.  Not able to tolerate dialysis or aggressive diuresis. History obtained from review of EMR, discussion with primary team, caregiver, family and/or Ms. FDomingo Cocking  Review and summarization of Epic records shows history from other than patient. Rest of 10 point ROS asked and negative.     Review of lab tests/diagnostics   Results for FLEO, WEYANDT(MRN  [id number] as of 11/28/2020 16:49  Ref. Range 11/24/2020 03:59  Sodium Latest Ref Range: 135 - 145 mmol/L  133 (L)  Potassium Latest Ref Range: 3.5 - 5.1 mmol/L 5.5 (H)  Chloride Latest Ref Range: 98 - 111 mmol/L 105  CO2 Latest Ref Range: 22 - 32 mmol/L 17 (L)  Glucose Latest Ref Range: 70 - 99 mg/dL 79  BUN Latest Ref Range: 8 - 23 mg/dL 109 (H)  Creatinine Latest Ref Range: 0.44 - 1.00 mg/dL 5.37 (H)  Calcium Latest Ref Range: 8.9 - 10.3 mg/dL 9.3  Anion gap Latest Ref Range: 5 - 15  11  GFR, Estimated Latest Ref Range: >60 mL/min 8 (L)   Recent Labs  Lab 11/22/20 0429  WBC 1.7*  HGB 11.5*  HCT 35.6*  PLT 81*  MCV 90.4   Recent Labs  Lab 11/22/20 0429 11/24/20 0359  NA 135 133*  K 4.7 5.5*  CL 104 105  CO2 17* 17*  BUN 112* 109*  CREATININE 5.25* 5.37*  GLUCOSE 109* 79       Physical Exam: Constitutional: NAD, cooperative General: Well groomed, cooperative EYES: anicteric sclera, lids intact, no discharge  ENMT: Moist mucous membrane CV: S1 S2, RRR, trace edema to BLE Pulmonary: no increased work of breathing, no cough, Abdomen: active BS + 4 quadrants, soft and non tender GU: no suprapubic tenderness MSK: weakness, limited ROM Skin: warm and dry, no rashes or wounds on visible skin Neuro:  weakness, otherwise non focal Psych: non-anxious affect Hem/lymph/immuno: no widespread bruising   PAST MEDICAL HISTORY:  Active Ambulatory Problems    Diagnosis Date Noted   Lupus ([place]) 04/15/2011   Numbness and tingling of left side of face 05/04/2013   TIA (transient ischemic attack) 05/05/2013   Lupus (systemic lupus erythematosus) ([place]) 09/24/2013   Encounter for immunization 11/19/2013   Hypertension with renal disease 04/16/2014   Increased urinary frequency 05/23/2014   Falls 05/23/2014   Rash and nonspecific skin eruption 05/23/2014   Chronic maxillary sinusitis 07/04/2014   Occipital headache 07/04/2014   Back pain at L4-L5 level 10/31/2014   Muscle  spasm of back 10/31/2014   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   CKD (chronic kidney disease) stage 5, GFR less than 15 ml/min ([name]) 02/15/2017   Dyslipidemia 11/06/2017   HFrEF (heart failure with reduced ejection fraction) ([name]) 03/06/2019   CAD (coronary artery disease) 03/06/2019   Paroxysmal atrial fibrillation ([name]) 03/06/2019   Anxiety disorder 06/26/2019   Allergic rhinitis 01/01/2020   Acute on chronic systolic CHF (congestive heart failure) ([name]) Jul 30, 2020   Acute respiratory failure with hypoxia ([name]) July 30, 2020   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   Elevated troponin Jul 30, 2020   Anemia of chronic disease 2020-07-30   Acute kidney injury superimposed on chronic kidney disease ([place]) 30-Jul-2020   Deceased-donor kidney transplant 03/27/2018   Peripheral neuropathy 10/13/2020   Insomnia 10/29/2020   Acute renal failure superimposed on stage 4 chronic kidney disease ([place]) 11/01/2020   Pyelonephritis of transplanted kidney 11/01/2020   Thrombocytopenia ([place]) 11/01/2020   Liver mass 11/01/2020   Heme positive stool 11/01/2020   AKI (acute kidney injury) ([place]) 11/02/2020   Right upper quadrant pain    Shortness of breath    Fluid overload 11/13/2020   Resolved Ambulatory Problems    Diagnosis Date Noted   CKD (chronic kidney disease) stage 4, GFR 15-29 ml/min ([place]) 07/02/2013   Prediabetes 05/23/2014   Visit for screening mammogram 09/30/2014   ESRD on dialysis ([place]) 06/27/2017   Aplastic anemia, unspecified ([place]) 01/05/2019   Acute exacerbation of CHF (  congestive heart failure) ([place]) 08/21/2020   Past Medical History:  Diagnosis Date   Anemia    Anxiety    Depression    Dyspnea    ESRD (end stage renal disease) ([place])    Essential hypertension    Head injury    History of kidney stones    Ischemic cardiomyopathy    Kidney transplant recipient    LA thrombus 10/2018   Obesity    Orthostatic  hypotension    PAF (paroxysmal atrial fibrillation) ([name])    Parietal lobe infarction ([name])    Pericarditis    Renal stone    Secondary hyperparathyroidism of renal origin ([place])    SLE (systemic lupus erythematosus) ([employer])     SOCIAL HX:  Social History   Tobacco Use   Smoking status: Never    Passive exposure: Past   Smokeless tobacco: Never  Substance Use Topics   Alcohol use: No     FAMILY HX:  Family History  Problem Relation Age of Onset   Diabetes Mother    Hypertension Father    Cancer Sister    Hypertension Brother       ALLERGIES:  Allergies  Allergen Reactions   Enalapril Maleate Anaphylaxis, Swelling and Other (See Comments)    Throat swells   Penicillins Other (See Comments)    Made patient lightheaded PATIENT HAS HAD A PCN REACTION WITH IMMEDIATE RASH, FACIAL/TONGUE/THROAT SWELLING, SOB, OR LIGHTHEADEDNESS WITH HYPOTENSION:  #  #  YES  #  #  Has patient had a PCN reaction causing severe rash involving mucus membranes or skin necrosis: No Has patient had a PCN reaction that required hospitalization: No Has patient had a PCN reaction occurring within the last 10 years: No If all of the above answers are "NO", then may proceed with Cephalosporin use.    Chocolate Nausea And Vomiting   Tape Itching, Rash and Other (See Comments)      PERTINENT MEDICATIONS:  Outpatient Encounter Medications as of 11/28/2020  Medication Sig   acetaminophen (TYLENOL) 500 MG tablet Take 1,000 mg by mouth every 6 (six) hours as needed for headache.   ALPRAZolam (XANAX) 0.25 MG tablet Take 1 tablet (0.25 mg total) by mouth at bedtime as needed for anxiety.   aspirin 81 MG EC tablet Take 81 mg by mouth every morning.   citalopram (CELEXA) 10 MG tablet Take 1 tablet (10 mg total) by mouth daily.   gabapentin (NEURONTIN) 100 MG capsule Take 1 capsule (100 mg total) by mouth at bedtime.   hydroxychloroquine (PLAQUENIL) 200 MG tablet Take 1 tablet (200 mg total) by mouth 2 (two)  times daily.   isosorbide-hydrALAZINE (BIDIL) 20-37.5 MG tablet Take 1 tablet by mouth 3 (three) times daily.   nitroGLYCERIN (NITROSTAT) 0.4 MG SL tablet Place 0.4 mg under the tongue every 5 (five) minutes as needed for chest pain.    ondansetron (ZOFRAN) 4 MG tablet Take 1 tablet (4 mg total) by mouth every 6 (six) hours as needed for nausea. (Patient taking differently: Take 4 mg by mouth every 6 (six) hours as needed for nausea or vomiting.)   pantoprazole (PROTONIX) 40 MG tablet Take 1 tablet (40 mg total) by mouth 2 (two) times daily before a meal. (Patient taking differently: Take 40 mg by mouth 2 (two) times daily.)   predniSONE (DELTASONE) 5 MG tablet Take 5 mg by mouth daily.   rosuvastatin (CRESTOR) 5 MG tablet Take 5 mg by mouth daily at 6 PM.   simethicone (  MYLICON) 80 MG chewable tablet Chew 80 mg by mouth every 6 (six) hours as needed for flatulence.   sodium bicarbonate 650 MG tablet Take 1 tablet (650 mg total) by mouth 3 (three) times daily.   sodium zirconium cyclosilicate (LOKELMA) 5 g packet Take 5 g by mouth daily for 10 days. Follow potassium in the next 3-5 days; adjust accordingly   tacrolimus (PROGRAF) 0.5 MG capsule Take 1 capsule (0.5 mg total) by mouth daily.   tacrolimus (PROGRAF) 0.5 MG capsule Take 1 capsule (0.5 mg total) by mouth at bedtime. (Patient not taking: No sig reported)   valGANciclovir (VALCYTE) 450 MG tablet Take 450 mg by mouth every other day.   No facility-administered encounter medications on file as of 11/28/2020.     Thank you for the opportunity to participate in the care of [name].  The palliative care team will continue to follow. Please call our office at [phone] if we can be of additional assistance.   Note: Portions of this note were generated with [employer]. Dictation errors may occur despite best attempts at proofreading.  Teodoro Spray, NP

## 2020-12-03 ENCOUNTER — Ambulatory Visit: Payer: Medicare Other | Admitting: Internal Medicine

## 2020-12-05 ENCOUNTER — Ambulatory Visit (INDEPENDENT_AMBULATORY_CARE_PROVIDER_SITE_OTHER): Payer: Medicare Other | Admitting: *Deleted

## 2020-12-05 ENCOUNTER — Telehealth: Payer: Medicare Other

## 2020-12-05 DIAGNOSIS — G459 Transient cerebral ischemic attack, unspecified: Secondary | ICD-10-CM

## 2020-12-05 DIAGNOSIS — W19XXXA Unspecified fall, initial encounter: Secondary | ICD-10-CM

## 2020-12-05 DIAGNOSIS — I2510 Atherosclerotic heart disease of native coronary artery without angina pectoris: Secondary | ICD-10-CM

## 2020-12-05 DIAGNOSIS — I129 Hypertensive chronic kidney disease with stage 1 through stage 4 chronic kidney disease, or unspecified chronic kidney disease: Secondary | ICD-10-CM

## 2020-12-05 DIAGNOSIS — N185 Chronic kidney disease, stage 5: Secondary | ICD-10-CM

## 2020-12-05 DIAGNOSIS — R5383 Other fatigue: Secondary | ICD-10-CM

## 2020-12-05 DIAGNOSIS — G6289 Other specified polyneuropathies: Secondary | ICD-10-CM

## 2020-12-05 DIAGNOSIS — F419 Anxiety disorder, unspecified: Secondary | ICD-10-CM

## 2020-12-05 NOTE — Chronic Care Management (AMB) (Signed)
Chronic Care Management    Clinical Social Work Note  12/05/2020 Name: Jenna [name] MRN: [id number] DOB: Mar 21, 1955  Jenna [name] is a 65 y.o. year old female who is a primary care patient of [name], [name], MD. The CCM team was consulted to assist the patient with chronic disease management and/or care coordination needs related to: Level of Care Concerns, Mental Health Counseling and Resources, and Hospice/Palliative Care Services Education.   Engaged with patient by telephone for follow-up visit in response to provider referral for social work chronic care management and care coordination services.   Consent to Services:  The patient was given information about Chronic Care Management services, agreed to services, and gave verbal consent prior to initiation of services.  Please see initial visit note for detailed documentation.   Patient agreed to services and consent obtained.   Assessment: Review of patient past medical history, allergies, medications, and health status, including review of relevant consultants reports was performed today as part of a comprehensive evaluation and provision of chronic care management and care coordination services.     SDOH (Social Determinants of Health) assessments and interventions performed:    Advanced Directives Status: Not ready or willing to discuss.  CCM Care Plan  Allergies  Allergen Reactions   Enalapril Maleate Anaphylaxis, Swelling and Other (See Comments)    Throat swells   Penicillins Other (See Comments)    Made patient lightheaded PATIENT HAS HAD A PCN REACTION WITH IMMEDIATE RASH, FACIAL/TONGUE/THROAT SWELLING, SOB, OR LIGHTHEADEDNESS WITH HYPOTENSION:  #  #  YES  #  #  Has patient had a PCN reaction causing severe rash involving mucus membranes or skin necrosis: No Has patient had a PCN reaction that required hospitalization: No Has patient had a PCN reaction occurring within the last 10 years: No If all of the above  answers are "NO", then may proceed with Cephalosporin use.    Chocolate Nausea And Vomiting   Tape Itching, Rash and Other (See Comments)    Outpatient Encounter Medications as of 12/05/2020  Medication Sig   acetaminophen (TYLENOL) 500 MG tablet Take 1,000 mg by mouth every 6 (six) hours as needed for headache.   ALPRAZolam (XANAX) 0.25 MG tablet Take 1 tablet (0.25 mg total) by mouth at bedtime as needed for anxiety.   aspirin 81 MG EC tablet Take 81 mg by mouth every morning.   citalopram (CELEXA) 10 MG tablet Take 1 tablet (10 mg total) by mouth daily.   gabapentin (NEURONTIN) 100 MG capsule Take 1 capsule (100 mg total) by mouth at bedtime.   hydroxychloroquine (PLAQUENIL) 200 MG tablet Take 1 tablet (200 mg total) by mouth 2 (two) times daily.   isosorbide-hydrALAZINE (BIDIL) 20-37.5 MG tablet Take 1 tablet by mouth 3 (three) times daily.   nitroGLYCERIN (NITROSTAT) 0.4 MG SL tablet Place 0.4 mg under the tongue every 5 (five) minutes as needed for chest pain.    ondansetron (ZOFRAN) 4 MG tablet Take 1 tablet (4 mg total) by mouth every 6 (six) hours as needed for nausea. (Patient taking differently: Take 4 mg by mouth every 6 (six) hours as needed for nausea or vomiting.)   pantoprazole (PROTONIX) 40 MG tablet Take 1 tablet (40 mg total) by mouth 2 (two) times daily before a meal. (Patient taking differently: Take 40 mg by mouth 2 (two) times daily.)   predniSONE (DELTASONE) 5 MG tablet Take 5 mg by mouth daily.   rosuvastatin (CRESTOR) 5 MG tablet Take 5 mg  by mouth daily at 6 PM.   simethicone (MYLICON) 80 MG chewable tablet Chew 80 mg by mouth every 6 (six) hours as needed for flatulence.   sodium bicarbonate 650 MG tablet Take 1 tablet (650 mg total) by mouth 3 (three) times daily.   tacrolimus (PROGRAF) 0.5 MG capsule Take 1 capsule (0.5 mg total) by mouth daily.   tacrolimus (PROGRAF) 0.5 MG capsule Take 1 capsule (0.5 mg total) by mouth at bedtime. (Patient not taking: No sig  reported)   valGANciclovir (VALCYTE) 450 MG tablet Take 450 mg by mouth every other day.   No facility-administered encounter medications on file as of 12/05/2020.    Patient Active Problem List   Diagnosis Date Noted   Fluid overload 11/13/2020   Shortness of breath    AKI (acute kidney injury) ([place]) 11/02/2020   Right upper quadrant pain    Acute renal failure superimposed on stage 4 chronic kidney disease ([place]) 11/01/2020   Pyelonephritis of transplanted kidney 11/01/2020   Thrombocytopenia ([place]) 11/01/2020   Liver mass 11/01/2020   Heme positive stool 11/01/2020   Insomnia 10/29/2020   Peripheral neuropathy 10/13/2020   Acute on chronic systolic CHF (congestive heart failure) ([place]) 07/11/2020   Acute respiratory failure with hypoxia ([place]) 07/11/2020   Cytomegalovirus (CMV) viremia ([place]) 07/11/2020   Elevated troponin 07/11/2020   Anemia of chronic disease 07/11/2020   Acute kidney injury superimposed on chronic kidney disease ([place]) 07/11/2020   Allergic rhinitis 01/01/2020   Anxiety disorder 06/26/2019   HFrEF (heart failure with reduced ejection fraction) ([place]) March 16, 2019   CAD (coronary artery disease) 16-Mar-2019   Paroxysmal atrial fibrillation ([place]) 03-16-2019   Deceased-donor kidney transplant 03/27/2018   Dyslipidemia 11/06/2017   CKD (chronic kidney disease) stage 5, GFR less than 15 ml/min ([name]) 02/15/2017   Back pain at L4-L5 level 10/31/2014   Muscle spasm of back 10/31/2014   Chronic maxillary sinusitis 07/04/2014   Occipital headache 07/04/2014   Increased urinary frequency 05/23/2014   Falls 05/23/2014   Rash and nonspecific skin eruption 05/23/2014   Hypertension with renal disease 04/16/2014   Encounter for immunization 11/19/2013   Lupus (systemic lupus erythematosus) ([place]) 09/24/2013   TIA (transient ischemic attack) 05/05/2013   Numbness and tingling of left side of face 05/04/2013   Lupus ([place]) 04/15/2011   FSGS (focal segmental glomerulosclerosis)  02/15/2009   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  Limited Social Support, Level of Care Concerns, Mental Health Concerns, and Limited Access to Caregiver.  Care Plan : LCSW Plan of Care  Updates made by Francis Gaines, LCSW since 12/05/2020 12:00 AM     Problem: Reduce and Manage My Symptoms of Anxiety and Depression.   Priority: High     Goal: Reduce and Manage My Symptoms of Anxiety and Depression.   Start Date: 10/16/2020  Expected End Date: 12/17/2020  This Visit's Progress: On track  Recent Progress: On track  Priority: High  Note:   Current Barriers:   Acute Mental Health needs related to Anxiety, Depression and Chronic Kidney Disease, Stage 5, requires Support, Education, Resources, Referrals and Care Coordination in order to meet unmet mental health needs. Clinical Goal(s):  Patient will work with LCSW to reduce and manage symptoms of Anxiety and Depression, until established with a community provider.   Patient will increase knowledge and/or ability of:  Coping Skills, Healthy Habits, Self-Management Skills, Stress Reduction, Home Safety and Utilizing [employer] and Resources.  Clinical Interventions:  Emotional Support provided, Quality of Sleep assessed and Sleep Hygiene Techniques promoted, Increase Level of Activity/Exercise encouraged, Verbalization of Feelings emphasized.   Collaboration with Primary Care Physician, Dr. Betty [name] regarding development and update of comprehensive plan of care as evidenced by provider attestation and co-signature. Inter-disciplinary care team collaboration (see longitudinal plan of care). Patient Goals/Self-Care Activities: Continue to receive personal counseling with LCSW, on a weekly/bi-weekly basis, to reduce and manage symptoms of Anxiety and Depression, until well-established with [facility]. Accept all calls from representative with [facility], in an  effort to establish ongoing mental health counseling and supportive services. Continue to reside at [facility], [place], to receive short-term rehabilitative services for strengthening, conditioning, mobility, safety, ambulation, etc. Continue to receive comfort measures through [facility] services.   Follow-Up:  12/17/2020 at 11:15am    [place] Licensed Clinical Social Worker [place] [phone]

## 2020-12-05 NOTE — Patient Instructions (Signed)
Visit Information  PATIENT GOALS:  Goals Addressed               This Visit's Progress     Reduce and Manage My Symptoms of Anxiety and Depression. (pt-stated)   On track     Timeframe:  Short-Term Goal Priority:  High Start Date:  10/16/2020                         Expected End Date:  12/17/2020                 Follow-Up Date:  12/17/2020 at 11:15am  Patient Goals/Self-Care Activities: Continue to receive personal counseling with LCSW, on a weekly/bi-weekly basis, to reduce and manage symptoms of Anxiety and Depression, until well-established with [facility]. Accept all calls from representative with [facility], in an effort to establish ongoing mental health counseling and supportive services. Continue to reside at [facility], [place], to receive short-term rehabilitative services for strengthening, conditioning, mobility, safety, ambulation, etc. Continue to receive comfort measures through [facility] services.         Patient verbalizes understanding of instructions provided today and agrees to view in [place].   Telephone follow up appointment with care management team member scheduled for:  12/17/2020 at 11:15am  [place] Licensed Clinical Social Worker [place] [phone]

## 2020-12-06 ENCOUNTER — Emergency Department (HOSPITAL_COMMUNITY): Payer: Medicare Other

## 2020-12-06 ENCOUNTER — Other Ambulatory Visit: Payer: Self-pay

## 2020-12-06 ENCOUNTER — Inpatient Hospital Stay (HOSPITAL_COMMUNITY)
Admission: EM | Admit: 2020-12-06 | Discharge: 2020-12-19 | DRG: 291 | Disposition: A | Discharge status: Hospice | Payer: Medicare Other | Source: Skilled Nursing Facility | Attending: Internal Medicine | Admitting: Internal Medicine

## 2020-12-06 ENCOUNTER — Encounter (HOSPITAL_COMMUNITY): Payer: Self-pay

## 2020-12-06 DIAGNOSIS — Z7982 Long term (current) use of aspirin: Secondary | ICD-10-CM

## 2020-12-06 DIAGNOSIS — E785 Hyperlipidemia, unspecified: Secondary | ICD-10-CM | POA: Diagnosis present

## 2020-12-06 DIAGNOSIS — Z66 Do not resuscitate: Secondary | ICD-10-CM | POA: Diagnosis not present

## 2020-12-06 DIAGNOSIS — Z91018 Allergy to other foods: Secondary | ICD-10-CM

## 2020-12-06 DIAGNOSIS — I480 Paroxysmal atrial fibrillation: Secondary | ICD-10-CM | POA: Diagnosis present

## 2020-12-06 DIAGNOSIS — I1310 Hypertensive heart and chronic kidney disease without heart failure, with stage 1 through stage 4 chronic kidney disease, or unspecified chronic kidney disease: Secondary | ICD-10-CM | POA: Diagnosis present

## 2020-12-06 DIAGNOSIS — Z91048 Other nonmedicinal substance allergy status: Secondary | ICD-10-CM

## 2020-12-06 DIAGNOSIS — I501 Left ventricular failure: Secondary | ICD-10-CM | POA: Diagnosis not present

## 2020-12-06 DIAGNOSIS — N186 End stage renal disease: Secondary | ICD-10-CM | POA: Diagnosis present

## 2020-12-06 DIAGNOSIS — F410 Panic disorder [episodic paroxysmal anxiety] without agoraphobia: Secondary | ICD-10-CM | POA: Diagnosis present

## 2020-12-06 DIAGNOSIS — Z880 Allergy status to penicillin: Secondary | ICD-10-CM

## 2020-12-06 DIAGNOSIS — Z8673 Personal history of transient ischemic attack (TIA), and cerebral infarction without residual deficits: Secondary | ICD-10-CM

## 2020-12-06 DIAGNOSIS — Z6832 Body mass index (BMI) 32.0-32.9, adult: Secondary | ICD-10-CM

## 2020-12-06 DIAGNOSIS — E872 Acidosis, unspecified: Secondary | ICD-10-CM | POA: Diagnosis present

## 2020-12-06 DIAGNOSIS — I2510 Atherosclerotic heart disease of native coronary artery without angina pectoris: Secondary | ICD-10-CM | POA: Diagnosis present

## 2020-12-06 DIAGNOSIS — B259 Cytomegaloviral disease, unspecified: Secondary | ICD-10-CM | POA: Diagnosis present

## 2020-12-06 DIAGNOSIS — E875 Hyperkalemia: Secondary | ICD-10-CM | POA: Diagnosis present

## 2020-12-06 DIAGNOSIS — F32A Depression, unspecified: Secondary | ICD-10-CM | POA: Diagnosis present

## 2020-12-06 DIAGNOSIS — M329 Systemic lupus erythematosus, unspecified: Secondary | ICD-10-CM | POA: Diagnosis present

## 2020-12-06 DIAGNOSIS — Z7189 Other specified counseling: Secondary | ICD-10-CM

## 2020-12-06 DIAGNOSIS — R54 Age-related physical debility: Secondary | ICD-10-CM | POA: Diagnosis present

## 2020-12-06 DIAGNOSIS — I132 Hypertensive heart and chronic kidney disease with heart failure and with stage 5 chronic kidney disease, or end stage renal disease: Secondary | ICD-10-CM | POA: Diagnosis not present

## 2020-12-06 DIAGNOSIS — I5023 Acute on chronic systolic (congestive) heart failure: Secondary | ICD-10-CM | POA: Diagnosis present

## 2020-12-06 DIAGNOSIS — M3214 Glomerular disease in systemic lupus erythematosus: Secondary | ICD-10-CM

## 2020-12-06 DIAGNOSIS — Z940 Kidney transplant status: Secondary | ICD-10-CM

## 2020-12-06 DIAGNOSIS — Z7952 Long term (current) use of systemic steroids: Secondary | ICD-10-CM

## 2020-12-06 DIAGNOSIS — N185 Chronic kidney disease, stage 5: Secondary | ICD-10-CM

## 2020-12-06 DIAGNOSIS — N19 Unspecified kidney failure: Secondary | ICD-10-CM | POA: Diagnosis present

## 2020-12-06 DIAGNOSIS — Z20822 Contact with and (suspected) exposure to covid-19: Secondary | ICD-10-CM | POA: Diagnosis present

## 2020-12-06 DIAGNOSIS — I513 Intracardiac thrombosis, not elsewhere classified: Secondary | ICD-10-CM | POA: Diagnosis present

## 2020-12-06 DIAGNOSIS — Z955 Presence of coronary angioplasty implant and graft: Secondary | ICD-10-CM

## 2020-12-06 DIAGNOSIS — Z8249 Family history of ischemic heart disease and other diseases of the circulatory system: Secondary | ICD-10-CM

## 2020-12-06 DIAGNOSIS — Z515 Encounter for palliative care: Secondary | ICD-10-CM

## 2020-12-06 DIAGNOSIS — J309 Allergic rhinitis, unspecified: Secondary | ICD-10-CM | POA: Diagnosis present

## 2020-12-06 DIAGNOSIS — E669 Obesity, unspecified: Secondary | ICD-10-CM | POA: Diagnosis present

## 2020-12-06 DIAGNOSIS — R32 Unspecified urinary incontinence: Secondary | ICD-10-CM | POA: Diagnosis not present

## 2020-12-06 DIAGNOSIS — N179 Acute kidney failure, unspecified: Secondary | ICD-10-CM | POA: Diagnosis not present

## 2020-12-06 DIAGNOSIS — R079 Chest pain, unspecified: Secondary | ICD-10-CM

## 2020-12-06 DIAGNOSIS — Z87442 Personal history of urinary calculi: Secondary | ICD-10-CM

## 2020-12-06 DIAGNOSIS — R451 Restlessness and agitation: Secondary | ICD-10-CM | POA: Diagnosis not present

## 2020-12-06 DIAGNOSIS — I472 Ventricular tachycardia, unspecified: Secondary | ICD-10-CM | POA: Diagnosis not present

## 2020-12-06 DIAGNOSIS — I252 Old myocardial infarction: Secondary | ICD-10-CM

## 2020-12-06 DIAGNOSIS — D631 Anemia in chronic kidney disease: Secondary | ICD-10-CM | POA: Diagnosis present

## 2020-12-06 DIAGNOSIS — N189 Chronic kidney disease, unspecified: Secondary | ICD-10-CM

## 2020-12-06 DIAGNOSIS — D696 Thrombocytopenia, unspecified: Secondary | ICD-10-CM | POA: Diagnosis present

## 2020-12-06 DIAGNOSIS — R001 Bradycardia, unspecified: Secondary | ICD-10-CM | POA: Diagnosis not present

## 2020-12-06 DIAGNOSIS — G9349 Other encephalopathy: Secondary | ICD-10-CM | POA: Diagnosis present

## 2020-12-06 DIAGNOSIS — R0602 Shortness of breath: Secondary | ICD-10-CM | POA: Diagnosis not present

## 2020-12-06 DIAGNOSIS — R627 Adult failure to thrive: Secondary | ICD-10-CM | POA: Diagnosis present

## 2020-12-06 DIAGNOSIS — I255 Ischemic cardiomyopathy: Secondary | ICD-10-CM | POA: Diagnosis present

## 2020-12-06 DIAGNOSIS — G9340 Encephalopathy, unspecified: Secondary | ICD-10-CM

## 2020-12-06 DIAGNOSIS — N2581 Secondary hyperparathyroidism of renal origin: Secondary | ICD-10-CM | POA: Diagnosis present

## 2020-12-06 DIAGNOSIS — G629 Polyneuropathy, unspecified: Secondary | ICD-10-CM | POA: Diagnosis present

## 2020-12-06 DIAGNOSIS — Z888 Allergy status to other drugs, medicaments and biological substances status: Secondary | ICD-10-CM

## 2020-12-06 DIAGNOSIS — Z79899 Other long term (current) drug therapy: Secondary | ICD-10-CM

## 2020-12-06 DIAGNOSIS — R0902 Hypoxemia: Secondary | ICD-10-CM | POA: Diagnosis present

## 2020-12-06 DIAGNOSIS — E440 Moderate protein-calorie malnutrition: Secondary | ICD-10-CM | POA: Diagnosis present

## 2020-12-06 LAB — COMPREHENSIVE METABOLIC PANEL
ALT: 19 U/L (ref 0–44)
AST: 28 U/L (ref 15–41)
Albumin: 3.1 g/dL — ABNORMAL LOW (ref 3.5–5.0)
Alkaline Phosphatase: 138 U/L — ABNORMAL HIGH (ref 38–126)
Anion gap: 16 — ABNORMAL HIGH (ref 5–15)
BUN: 124 mg/dL — ABNORMAL HIGH (ref 8–23)
CO2: 18 mmol/L — ABNORMAL LOW (ref 22–32)
Calcium: 9.7 mg/dL (ref 8.9–10.3)
Chloride: 105 mmol/L (ref 98–111)
Creatinine, Ser: 5.01 mg/dL — ABNORMAL HIGH (ref 0.44–1.00)
GFR, Estimated: 9 mL/min — ABNORMAL LOW (ref >60)
Glucose, Bld: 120 mg/dL — ABNORMAL HIGH (ref 70–99)
Potassium: 4.3 mmol/L (ref 3.5–5.1)
Sodium: 139 mmol/L (ref 135–145)
Total Bilirubin: 1.6 mg/dL — ABNORMAL HIGH (ref 0.3–1.2)
Total Protein: 7 g/dL (ref 6.5–8.1)

## 2020-12-06 LAB — CBC WITH DIFFERENTIAL/PLATELET
Abs Immature Granulocytes: 0.04 10*3/uL (ref 0.00–0.07)
Basophils Absolute: 0 10*3/uL (ref 0.0–0.1)
Basophils Relative: 0 %
Eosinophils Absolute: 0 10*3/uL (ref 0.0–0.5)
Eosinophils Relative: 0 %
HCT: 35.9 % — ABNORMAL LOW (ref 36.0–46.0)
Hemoglobin: 11.4 g/dL — ABNORMAL LOW (ref 12.0–15.0)
Immature Granulocytes: 1 %
Lymphocytes Relative: 4 %
Lymphs Abs: 0.2 10*3/uL — ABNORMAL LOW (ref 0.7–4.0)
MCH: 28.9 pg (ref 26.0–34.0)
MCHC: 31.8 g/dL (ref 30.0–36.0)
MCV: 91.1 fL (ref 80.0–100.0)
Monocytes Absolute: 0.3 10*3/uL (ref 0.1–1.0)
Monocytes Relative: 9 %
Neutro Abs: 2.9 10*3/uL (ref 1.7–7.7)
Neutrophils Relative %: 86 %
Platelets: 74 10*3/uL — ABNORMAL LOW (ref 150–400)
RBC: 3.94 MIL/uL (ref 3.87–5.11)
RDW: 20.4 % — ABNORMAL HIGH (ref 11.5–15.5)
WBC: 3.4 10*3/uL — ABNORMAL LOW (ref 4.0–10.5)
nRBC: 0.6 % — ABNORMAL HIGH (ref 0.0–0.2)

## 2020-12-06 LAB — LIPASE, BLOOD: Lipase: 22 U/L (ref 11–51)

## 2020-12-06 LAB — RESP PANEL BY RT-PCR (FLU A&B, COVID) ARPGX2
Influenza A by PCR: NEGATIVE
Influenza B by PCR: NEGATIVE
SARS Coronavirus 2 by RT PCR: NEGATIVE

## 2020-12-06 LAB — BRAIN NATRIURETIC PEPTIDE: B Natriuretic Peptide: >4500 pg/mL — ABNORMAL HIGH (ref 0.0–100.0)

## 2020-12-06 LAB — TROPONIN I (HIGH SENSITIVITY)
Troponin I (High Sensitivity): 184 ng/L (ref <18)
Troponin I (High Sensitivity): 183 ng/L (ref <18)

## 2020-12-06 MED ORDER — ISOSORB DINITRATE-HYDRALAZINE 20-37.5 MG PO TABS
1.0000 | ORAL_TABLET | Freq: Three times a day (TID) | ORAL | Status: DC
  Administered 2020-12-07 – 2020-12-14 (×16): 1 via ORAL
  Filled 2020-12-06 (×27): qty 1

## 2020-12-06 MED ORDER — POLYETHYLENE GLYCOL 3350 17 G PO PACK: 17.0000 g | PACK | Freq: Every day | ORAL | Status: DC | PRN

## 2020-12-06 MED ORDER — PREDNISONE 5 MG PO TABS
5.0000 mg | ORAL_TABLET | Freq: Every day | ORAL | Status: DC
  Administered 2020-12-09 – 2020-12-17 (×9): 5 mg via ORAL
  Filled 2020-12-06 (×11): qty 1

## 2020-12-06 MED ORDER — ACETAMINOPHEN 325 MG PO TABS
650.0000 mg | ORAL_TABLET | Freq: Four times a day (QID) | ORAL | Status: DC | PRN
  Administered 2020-12-09 – 2020-12-17 (×4): 650 mg via ORAL
  Filled 2020-12-06 (×4): qty 2

## 2020-12-06 MED ORDER — HYDROXYCHLOROQUINE SULFATE 200 MG PO TABS
200.0000 mg | ORAL_TABLET | Freq: Every day | ORAL | Status: DC
  Administered 2020-12-09 – 2020-12-17 (×9): 200 mg via ORAL
  Filled 2020-12-06 (×11): qty 1

## 2020-12-06 MED ORDER — ROSUVASTATIN CALCIUM 5 MG PO TABS
5.0000 mg | ORAL_TABLET | Freq: Every day | ORAL | Status: DC
  Administered 2020-12-09 – 2020-12-16 (×8): 5 mg via ORAL
  Filled 2020-12-06 (×7): qty 1

## 2020-12-06 MED ORDER — ASPIRIN EC 81 MG PO TBEC
81.0000 mg | DELAYED_RELEASE_TABLET | Freq: Every morning | ORAL | Status: DC
  Administered 2020-12-09 – 2020-12-17 (×9): 81 mg via ORAL
  Filled 2020-12-06 (×8): qty 1

## 2020-12-06 MED ORDER — ONDANSETRON HCL 4 MG/2ML IJ SOLN
4.0000 mg | Freq: Four times a day (QID) | INTRAMUSCULAR | Status: DC | PRN
  Administered 2020-12-09: 4 mg via INTRAVENOUS
  Filled 2020-12-06 (×2): qty 2

## 2020-12-06 MED ORDER — TACROLIMUS 0.5 MG PO CAPS
0.5000 mg | ORAL_CAPSULE | Freq: Every day | ORAL | Status: DC
  Administered 2020-12-09 – 2020-12-17 (×9): 0.5 mg via ORAL
  Filled 2020-12-06 (×11): qty 1

## 2020-12-06 MED ORDER — VALGANCICLOVIR HCL 450 MG PO TABS
450.0000 mg | ORAL_TABLET | ORAL | Status: DC
  Administered 2020-12-10 – 2020-12-16 (×4): 450 mg via ORAL
  Filled 2020-12-06 (×5): qty 1

## 2020-12-06 MED ORDER — FUROSEMIDE 10 MG/ML IJ SOLN
80.0000 mg | Freq: Once | INTRAMUSCULAR | Status: AC
  Administered 2020-12-07: 80 mg via INTRAVENOUS
  Filled 2020-12-06: qty 8

## 2020-12-06 MED ORDER — PANTOPRAZOLE SODIUM 40 MG PO TBEC
40.0000 mg | DELAYED_RELEASE_TABLET | Freq: Two times a day (BID) | ORAL | Status: DC
  Filled 2020-12-06: qty 1

## 2020-12-06 MED ORDER — ACETAMINOPHEN 650 MG RE SUPP: 650.0000 mg | Freq: Four times a day (QID) | RECTAL | Status: DC | PRN

## 2020-12-06 MED ORDER — ALBUTEROL SULFATE (2.5 MG/3ML) 0.083% IN NEBU: 2.5000 mg | INHALATION_SOLUTION | RESPIRATORY_TRACT | Status: DC | PRN

## 2020-12-06 MED ORDER — CITALOPRAM HYDROBROMIDE 10 MG PO TABS
10.0000 mg | ORAL_TABLET | Freq: Every day | ORAL | Status: DC
  Administered 2020-12-09 – 2020-12-17 (×9): 10 mg via ORAL
  Filled 2020-12-06 (×11): qty 1

## 2020-12-06 MED ORDER — ONDANSETRON HCL 4 MG PO TABS: 4.0000 mg | ORAL_TABLET | Freq: Four times a day (QID) | ORAL | Status: DC | PRN

## 2020-12-06 MED ORDER — HEPARIN SODIUM (PORCINE) 5000 UNIT/ML IJ SOLN
5000.0000 [IU] | Freq: Three times a day (TID) | INTRAMUSCULAR | Status: DC
  Administered 2020-12-07 – 2020-12-17 (×18): 5000 [IU] via SUBCUTANEOUS
  Filled 2020-12-06 (×22): qty 1

## 2020-12-06 MED ORDER — SODIUM BICARBONATE 650 MG PO TABS
650.0000 mg | ORAL_TABLET | Freq: Three times a day (TID) | ORAL | Status: DC
  Administered 2020-12-09 – 2020-12-11 (×7): 650 mg via ORAL
  Filled 2020-12-06 (×9): qty 1

## 2020-12-06 MED ORDER — NITROGLYCERIN 0.4 MG SL SUBL: 0.4000 mg | SUBLINGUAL_TABLET | SUBLINGUAL | Status: DC | PRN

## 2020-12-06 NOTE — H&P (Signed)
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  Of note, patient was recently hospitalized at [facility] from 9/29 until 10/10 during that hospitalization patient was managed for acute on chronic congestive heart failure thought to be secondary to decreasing urine output from progressive cardiorenal syndrome.  Patient was evaluated by nephrology but was felt to not be able to tolerate reinitiating hemodialysis.  Patient did have some improvement in symptoms and after coordination with the family was discharged back to her skilled nursing facility on 10/10.  Patient is an extremely poor historian due to encephalopathy and the majority the history has been obtained from discussions with the daughter as well as discussions with emergency department staff and review of their notes.  Daughter reports the patient has been exhibiting increasing lethargy ever since her discharge on 10/10.  This has been associated with decreasing oral intake.  Earlier in the evening at approximately 6 PM on 10/22, patient began to complain of chest discomfort.  Patient was administered 3 separate doses of nitroglycerin as well as a dose of oxycodone without any improvement in chest  discomfort.  Patient's daughter was visiting at the time and witnessed the event.    Of note, daughter also reports that patient has exhibited progressively worsening shortness of breath since her recent hospitalization as well.  Patient symptoms continued to persist until EMS was contacted who promptly came to evaluate the patient and brought the patient into [facility] emergency permit for evaluation.  Upon evaluation in the emergency department initial work-up revealed a significant right lower lobe opacity on chest x-ray with stable left basilar opacity and bilateral pleural effusions.  Due to ongoing symptoms of shortness of breath the hospitalist group was called to assess the patient for admission to the hospital.  Review of Systems:   Review of Systems  Unable to perform ROS: Mental acuity   Past Medical History:  Diagnosis Date   Anemia    Anxiety    panic attack- talks herself and takes deep breathes   CAD (coronary artery disease)    a. STEMI 10/2018 s/p DES to LAD.   Depression    Dyspnea    with exertion    ESRD (end stage renal disease) ([place])    hemo TTHSAT   Essential hypertension    FSGS (focal segmental glomerulosclerosis) 2011   By renal biopsy   Head injury    age 65   HFrEF (heart failure with reduced ejection fraction) ([place])    History of kidney stones    kidney stone   Hx of lupus nephritis 2011   by renal biopsy   Ischemic cardiomyopathy    Kidney transplant recipient    LA thrombus 10/2018   Lupus (systemic lupus erythematosus) ([place])    followed by Dr. 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 remote parietal infarct on brain MRI 12/2019.   Pericarditis    age 89ish   Renal stone    Secondary hyperparathyroidism of renal origin ([place])    SLE (systemic lupus erythematosus) ([place])    TIA (transient ischemic attack)    no residual effects    Past  Surgical History:  Procedure Laterality Date   AV FISTULA PLACEMENT Left 07/04/2017   Procedure: ARTERIOVENOUS (AV) FISTULA CREATION BRACHIOCEPHALIC;  Surgeon: [name], MD;  Location: [place] OR;  Service: Vascular;  Laterality: Left;   COLONOSCOPY W/ POLYPECTOMY     IR FLUORO GUIDE CV LINE RIGHT  06/28/2017   IR US GUIDE VASC ACCESS RIGHT  06/28/2017   KIDNEY SURGERY     kidney transplant 2020   REVISION OF ARTERIOVENOUS GORETEX GRAFT Left 11/30/2017   Procedure: REVISION OF ARTERIOVENOUS FISTULA LEFT ARM Superfistulization and branch ligation.;  Surgeon: Waynetta Sandy, MD;  Location: [facility];  Service: Vascular;  Laterality: Left;     reports that she has never smoked. She has been exposed to tobacco smoke. She has never used smokeless tobacco. She reports that she does not drink alcohol and does not use drugs.  Allergies  Allergen Reactions   Enalapril Maleate Anaphylaxis, Swelling and Other (See Comments)    Throat swells   Penicillins Other (See Comments)    Made patient lightheaded PATIENT HAS HAD A PCN REACTION WITH IMMEDIATE RASH, FACIAL/TONGUE/THROAT SWELLING, SOB, OR LIGHTHEADEDNESS WITH HYPOTENSION:  #  #  YES  #  #  Has patient had a PCN reaction causing severe rash involving mucus membranes or skin necrosis: No Has patient had a PCN reaction that required hospitalization: No Has patient had a PCN reaction occurring within the last 10 years: No If all of the above answers are "NO", then may proceed with Cephalosporin use.    Chocolate Nausea And Vomiting   Tape Itching, Rash and Other (See Comments)    Family History  Problem Relation Age of Onset   Diabetes Mother    Hypertension Father    Cancer Sister    Hypertension Brother      Prior to Admission medications   Medication Sig Start Date End Date Taking? Authorizing Provider  acetaminophen (TYLENOL) 500 MG tablet Take 1,000 mg by mouth every 6 (six) hours as needed for headache.    [provider]  ALPRAZolam Duanne Moron) 0.25 MG tablet Take 1 tablet (0.25 mg total) by mouth at bedtime as needed for anxiety. 11/11/20   Raiford Noble Latif, DO  aspirin 81 MG EC tablet Take 81 mg by mouth every morning. 11/21/19   [provider]  citalopram (CELEXA) 10 MG tablet Take 1 tablet (10 mg total) by mouth daily. 10/29/20   [name], Betty G, MD  gabapentin (NEURONTIN) 100 MG capsule Take 1 capsule (100 mg total) by mouth at bedtime. 11/11/20   Raiford Noble Latif, DO  hydroxychloroquine (PLAQUENIL) 200 MG tablet Take 1 tablet (200 mg total) by mouth 2 (two) times daily. 12/25/13   Tresa Garter, MD  isosorbide-hydrALAZINE (BIDIL) 20-37.5 MG tablet Take 1 tablet by mouth 3 (three) times daily. 08/26/20   Debbe Odea, MD  nitroGLYCERIN (NITROSTAT) 0.4 MG SL tablet Place 0.4 mg under the tongue every 5 (five) minutes as needed for chest pain.     [provider]  ondansetron (ZOFRAN) 4 MG tablet  Take 1 tablet (4 mg total) by mouth every 6 (six) hours as needed for nausea. Patient taking differently: Take 4 mg by mouth every 6 (six) hours as needed for nausea or vomiting. 11/11/20   Raiford Noble Latif, DO  pantoprazole (PROTONIX) 40 MG tablet Take 1 tablet (40 mg total) by mouth 2 (two) times daily before a meal. Patient taking differently: Take 40 mg by mouth 2 (two) times daily. 11/11/20   Raiford Noble Latif, DO  predniSONE (DELTASONE) 5 MG tablet Take 5 mg by mouth daily. 05/16/19   [provider]  rosuvastatin (CRESTOR) 5 MG tablet Take 5 mg by mouth daily at 6 PM. 05/02/19   [provider]  simethicone (MYLICON) 80 MG chewable tablet Chew 80 mg by mouth every 6 (six) hours as needed for flatulence.    [provider]  sodium bicarbonate 650 MG tablet Take 1 tablet (650 mg total) by mouth 3 (three) times daily. 11/11/20   Raiford Noble Latif, DO  tacrolimus (PROGRAF) 0.5 MG capsule Take 1 capsule (0.5 mg total) by mouth daily. 11/12/20   Raiford Noble Latif, DO  tacrolimus (PROGRAF) 0.5 MG capsule Take 1 capsule (0.5 mg total) by mouth at bedtime. Patient not taking: No sig reported 11/11/20   Raiford Noble Latif, DO  valGANciclovir (VALCYTE) 450 MG tablet Take 450 mg by mouth every other day.    [provider]    Physical Exam: Vitals:   12/06/20 2145 12/06/20 2200 12/06/20 2230 12/06/20 2245  BP: (!) 144/91 (!) 142/79 137/90 (!) 155/89  Pulse: 79 75 76 86  Resp: $Remo'12 10 17 18  'GjaPD$ SpO2: 100% 100% 100% 100%    Constitutional: Extremely lethargic, minimally arousable, in mild distress due to chest discomfort. Skin: no rashes, no lesions, good skin turgor noted. Eyes: Pupils are equally reactive to light.  No evidence of scleral icterus or conjunctival pallor.  ENMT: dry mucous membranes noted.  Posterior pharynx clear of any exudate or lesions.   Neck: normal, supple, no masses, no thyromegaly.  No evidence of jugular venous distension.   Respiratory: Bibasilar and mid field rales without wheezing.  Some increase in respiratory effort without accessory muscle use.  Cardiovascular: Regular rate and rhythm, no murmurs / rubs / gallops.  Extensive bilateral lower extremity pitting edema that tracks in the feet all the way up through the thighs. 2+ pedal pulses. No carotid bruits.  Chest:   Nontender without crepitus or deformity.   Back:   Nontender without crepitus or deformity. Abdomen: Abdomen is soft and nontender.  No evidence of intra-abdominal masses.  Positive bowel sounds noted in all quadrants.   Musculoskeletal: No joint deformity upper and lower extremities. Good ROM, no contractures. Normal muscle tone.  Neurologic: Patient responsive to verbal stimuli.  Patient not consistently following commands.  Patient localizes to pain.  Patient is able to move all 4 extremity spontaneously. Psychiatric: Able to assess due to severe lethargy.  Patient currently does not seem to possess insight as to her current  situation.   Labs on Admission: I have personally reviewed following labs and imaging studies -   CBC: Recent Labs  Lab 12/06/20 2014  WBC 3.4*  NEUTROABS 2.9  HGB 11.4*  HCT 35.9*  MCV 91.1  PLT 74*   Basic Metabolic Panel: Recent Labs  Lab 12/06/20 2014  NA 139  K 4.3  CL 105  CO2 18*  GLUCOSE 120*  BUN 124*  CREATININE 5.01*  CALCIUM 9.7  GFR: Estimated Creatinine Clearance: 13.2 mL/min (A) (by C-G formula based on SCr of 5.01 mg/dL (H)). Liver Function Tests: Recent Labs  Lab 12/06/20 2014  AST 28  ALT 19  ALKPHOS 138*  BILITOT 1.6*  PROT 7.0  ALBUMIN 3.1*   Recent Labs  Lab 12/06/20 2014  LIPASE 22   No results for input(s): AMMONIA in the last 168 hours. Coagulation Profile: No results for input(s): INR, PROTIME in the last 168 hours. Cardiac Enzymes: No results for input(s): CKTOTAL, CKMB, CKMBINDEX, TROPONINI in the last 168 hours. BNP (last 3 results) No results for input(s): PROBNP in the last 8760 hours. HbA1C: No results for input(s): HGBA1C in the last 72 hours. CBG: No results for input(s): GLUCAP in the last 168 hours. Lipid Profile: No results for input(s): CHOL, HDL, LDLCALC, TRIG, CHOLHDL, LDLDIRECT in the last 72 hours. Thyroid Function Tests: No results for input(s): TSH, T4TOTAL, FREET4, T3FREE, THYROIDAB in the last 72 hours. Anemia Panel: No results for input(s): VITAMINB12, FOLATE, FERRITIN, TIBC, IRON, RETICCTPCT in the last 72 hours. Urine analysis:    Component Value Date/Time   COLORURINE YELLOW 11/13/2020 1410   APPEARANCEUR CLEAR 11/13/2020 1410   LABSPEC 1.015 11/13/2020 1410   PHURINE 5.0 11/13/2020 1410   GLUCOSEU NEGATIVE 11/13/2020 1410   HGBUR NEGATIVE 11/13/2020 1410   BILIRUBINUR NEGATIVE 11/13/2020 1410   BILIRUBINUR neg 12/19/2014 1157   KETONESUR 5 (A) 11/13/2020 1410   PROTEINUR >=300 (A) 11/13/2020 1410   UROBILINOGEN 0.2 12/19/2014 1157   UROBILINOGEN 0.2 05/23/2014 1301   NITRITE NEGATIVE  11/13/2020 1410   LEUKOCYTESUR NEGATIVE 11/13/2020 1410    Radiological Exams on Admission - Personally Reviewed: DG Chest Port 1 View  Result Date: 12/06/2020 CLINICAL DATA:  Chest pain. EXAM: PORTABLE CHEST 1 VIEW COMPARISON:  November 17, 2020 FINDINGS: Moderate severity dense airspace opacity is again seen within the right lower lobe, decreased in severity when compared to the prior study. Predominant stable left basilar opacity is seen. Very small, stable bilateral pleural effusions are seen. No pneumothorax is identified. The cardiac silhouette is markedly enlarged and unchanged in size. The visualized skeletal structures are unremarkable. IMPRESSION: 1. Moderate severity dense right lower lobe airspace opacity, decreased in severity when compared to the prior study. 2. Stable left basilar opacity, consistent with atelectasis and/or infiltrate. 3. Very small, stable bilateral pleural effusions. Electronically Signed   By: Virgina Norfolk M.D.   On: 12/06/2020 20:35    EKG: Personally reviewed.  Rhythm is normal sinus rhythm with heart rate of 83 bpm.  No dynamic ST segment changes appreciated.  Assessment/Plan  * Pulmonary edema with left heart failure [facility]) Patient presenting with ongoing shortness of breath with known pulmonary edema in the setting of advanced congestive heart failure and very limited therapeutic options  Chest x-ray revealing persisting pulmonary edema with bilateral pleural effusions and infiltrates Exam additionally reveals significant rales and extensive bilateral lower extremity pitting edema Patient felt to not be a candidate for resumption of hemodialysis per discussion with nephrology during last hospitalization in early October Will attempt to give a one-time dose of 80 mg of Lasix to attempt to achieve some degree of diuresis Furthermore there seem to be superimposed bibasilar infiltrates which may simply be atelectasis in the absence of fever or leukocytosis.   Will obtain a procalcitonin and CRP to attempt evaluate further and will consider treating with antibiotics if patient exhibits any signs of SIRS or other evidence of progressive infection Supplemental oxygen for bouts  of hypoxia As needed bronchodilator therapy for shortness of breath and wheezing   Chest pain According to the daughter the patient has been complaining of chest discomfort since approximately 6 PM this evening No evidence of dynamic ST segment change Troponin is slightly elevated at 184, a significant increase compared to previous troponin sample obtained 3 weeks ago Patient continuing to complain of mild chest discomfort here in the emergency department which will be managed with as needed nitroglycerin as blood pressure tolerates Patient is not a candidate for cardiac catheterization considering her advanced tenuous renal function Continue to cycle cardiac enzymes Monitoring patient on telemetry Will consider increase of long-acting nitrate if chest discomfort continues to persist  Acute on chronic systolic CHF (congestive heart failure) ([name]) Assessment and plan as above under pulmonary edema  CKD (chronic kidney disease) stage 5, GFR less than 15 ml/min ([name]) Significant decline in renal function over the past several months which is thought to be secondary to progressive cardiorenal syndrome per review of notes during last hospitalization Creatinine seems to be stable compared to recent hospitalization however. Regardless, patient is continuing to suffer from the sequela of advanced renal disease with persisting volume overload and uremic encephalopathy Patient evaluated by nephrology during most recent hospitalization earlier in the month and at the time it was mentioned that patient is likely not a candidate for resumption of dialysis Will discuss any potential therapeutic options for this patient with nephrology in the morning We will additionally place order for  palliative care consultation in the morning  Uremic encephalopathy Patient exhibiting substantial lethargy during my interview and evaluation with her Daughter reports the patient has been exhibiting increasing lethargy, confusion and extremely poor oral intake since her discharge from the hospital on 10/10 Patient is likely suffering from progressive uremic encephalopathy due to advanced renal disease This is also likely exacerbated this evening by administration of Percocet for her chest discomfort by the skilled nursing facility staff Supportive care, attempting to avoid sedating agents  Coronary artery disease involving native coronary artery of native heart Longstanding known history of significant coronary artery disease Presentation with chest discomfort of unclear etiology Continuing home regimen of antiplatelet therapy, statin therapy Monitoring patient on telemetry Otherwise, management of chest pain as above  Paroxysmal atrial fibrillation ([place]) Patient currently in atrial fibrillation however rate controlled Patient not on anticoagulation per home regimen Monitoring on telemetry  Lupus (systemic lupus erythematosus) ([place]) Continue home regimen of hydroxychloroquine if patient is able to tolerate oral intake  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      Code Status:  Full code  code status decision has been confirmed with:  daughter Family Communication: Plan of care has been discussed with the daughter via phone conversation.  Status is: Observation  The patient remains OBS appropriate and will d/c before 2 midnights.       Vernelle Emerald MD [facility] Pager [phone]  If 7PM-7AM, please contact night-coverage [email or web address] Use universal [place] password for that web site. If you do not have  the password, please call the hospital operator.  12/07/2020, 12:12 AM

## 2020-12-06 NOTE — ED Provider Notes (Signed)
[facility] EMERGENCY DEPARTMENT Provider Note   CSN: [id number] Arrival date & time: 12/06/20  1946     History Chief Complaint  Patient presents with   Chest Pain    Jenna [name] is a 65 y.o. female.  She is brought in by EMS from her facility after experiencing chest pain earlier today.  Facility gave her 3 nitro and oxycodone.  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  She is not a candidate to go back on dialysis per daughter.  Daughter is here providing history.  She said she is under palliative care and trending towards hospice.  Has not been eating or drinking much and they are giving her subcu fluids.  Patient unable to provide any history.  Level 5 caveat  The history is provided by the patient and a relative.  Chest Pain Pain location:  Substernal area Pain severity:  Severe Onset quality:  Gradual Duration:  2 hours Timing:  Intermittent Progression:  Improving Chronicity:  Recurrent Relieved by:  Nitroglycerin Worsened by:  Nothing Ineffective treatments:  None tried Associated symptoms: nausea and shortness of breath   Associated symptoms: no cough and no fever   Risk factors: coronary artery disease       Past Medical History:  Diagnosis Date   Anemia    Anxiety    panic attack- talks herself and takes deep breathes   CAD (coronary artery disease)    a. STEMI 10/2018 s/p DES to LAD.   Depression    Dyspnea    with exertion    ESRD (end stage renal disease) ([place])    hemo TTHSAT   Essential hypertension    FSGS (focal segmental glomerulosclerosis) 2011   By renal biopsy   Head injury    age 65   HFrEF (heart failure with reduced ejection fraction) ([place])    History of kidney stones    kidney stone   Hx of lupus nephritis 2011   by renal biopsy   Ischemic cardiomyopathy    Kidney transplant recipient    LA thrombus 10/2018   Lupus (systemic lupus erythematosus) ([place])    followed by Dr.  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)    a. remote parietal infarct on brain MRI 12/2019.   Pericarditis    age 65   Renal stone    Secondary hyperparathyroidism of renal origin ([place])    SLE (systemic lupus erythematosus) ([place])    TIA (transient ischemic attack)    no residual effects    Patient Active Problem List   Diagnosis Date Noted   Fluid overload 11/13/2020   Shortness of breath    AKI (acute kidney injury) ([place]) 11/02/2020   Right upper quadrant pain    Acute renal failure superimposed on stage 4 chronic kidney disease ([place]) 11/01/2020   Pyelonephritis of transplanted kidney 11/01/2020   Thrombocytopenia ([place]) 11/01/2020   Liver mass 11/01/2020   Heme positive stool 11/01/2020   Insomnia 10/29/2020   Peripheral neuropathy 10/13/2020   Acute on chronic systolic CHF (congestive heart failure) ([place]) 07/11/2020   Acute respiratory failure with hypoxia ([place]) 07/11/2020   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   Elevated troponin 07/11/2020   Anemia of chronic disease 07/11/2020   Acute kidney injury superimposed on chronic kidney disease ([place]) 07/11/2020   Allergic rhinitis 01/01/2020   Anxiety disorder 06/26/2019  HFrEF (heart failure with reduced ejection fraction) ([place]) March 30, 2019   CAD (coronary artery disease) 2019/03/30   Paroxysmal atrial fibrillation ([place]) 2019-03-30   Deceased-donor kidney transplant 03/27/2018   Dyslipidemia 11/06/2017   CKD (chronic kidney disease) stage 5, GFR less than 15 ml/min ([name]) 02/15/2017   Back pain at L4-L5 level 10/31/2014   Muscle spasm of back 10/31/2014   Chronic maxillary sinusitis 07/04/2014   Occipital headache 07/04/2014   Increased urinary frequency 05/23/2014   Falls 05/23/2014   Rash and nonspecific skin eruption 05/23/2014   Hypertension with renal disease 04/16/2014   Encounter for immunization 11/19/2013   Lupus  (systemic lupus erythematosus) ([place]) 09/24/2013   TIA (transient ischemic attack) 05/05/2013   Numbness and tingling of left side of face 05/04/2013   Lupus ([place]) 04/15/2011   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    Past Surgical History:  Procedure Laterality Date   AV FISTULA PLACEMENT Left 07/04/2017   Procedure: ARTERIOVENOUS (AV) FISTULA CREATION BRACHIOCEPHALIC;  Surgeon: [name], MD;  Location: [facility];  Service: Vascular;  Laterality: Left;   COLONOSCOPY W/ POLYPECTOMY     IR FLUORO GUIDE CV LINE RIGHT  06/28/2017   IR US GUIDE VASC ACCESS RIGHT  06/28/2017   KIDNEY SURGERY     kidney transplant 2020   REVISION OF ARTERIOVENOUS GORETEX GRAFT Left 11/30/2017   Procedure: REVISION OF ARTERIOVENOUS FISTULA LEFT ARM Superfistulization and branch ligation.;  Surgeon: Waynetta Sandy, MD;  Location: [facility];  Service: Vascular;  Laterality: Left;     OB History     Gravida  2   Para  1   Term      Preterm      AB  1   Living  1      SAB  1   IAB      Ectopic      Multiple      Live Births              Family History  Problem Relation Age of Onset   Diabetes Mother    Hypertension Father    Cancer Sister    Hypertension Brother     Social History   Tobacco Use   Smoking status: Never    Passive exposure: Past   Smokeless tobacco: Never  Vaping Use   Vaping Use: Never used  Substance Use Topics   Alcohol use: No   Drug use: No    Home Medications Prior to Admission medications   Medication Sig Start Date End Date Taking? Authorizing Provider  acetaminophen (TYLENOL) 500 MG tablet Take 1,000 mg by mouth every 6 (six) hours as needed for headache.    [provider]  ALPRAZolam Duanne Moron) 0.25 MG tablet Take 1 tablet (0.25 mg total) by mouth at bedtime as needed for anxiety. 11/11/20   Raiford Noble Latif, DO  aspirin 81 MG EC tablet Take 81 mg by mouth every morning. 11/21/19    [provider]  citalopram (CELEXA) 10 MG tablet Take 1 tablet (10 mg total) by mouth daily. 10/29/20   [name], Betty G, MD  gabapentin (NEURONTIN) 100 MG capsule Take 1 capsule (100 mg total) by mouth at bedtime. 11/11/20   Raiford Noble Latif, DO  hydroxychloroquine (PLAQUENIL) 200 MG tablet Take 1 tablet (200 mg total) by mouth 2 (two) times daily. 12/25/13   Tresa Garter, MD  isosorbide-hydrALAZINE (BIDIL) 20-37.5 MG tablet Take 1 tablet by mouth  3 (three) times daily. 08/26/20   Debbe Odea, MD  nitroGLYCERIN (NITROSTAT) 0.4 MG SL tablet Place 0.4 mg under the tongue every 5 (five) minutes as needed for chest pain.     [provider]  ondansetron (ZOFRAN) 4 MG tablet Take 1 tablet (4 mg total) by mouth every 6 (six) hours as needed for nausea. Patient taking differently: Take 4 mg by mouth every 6 (six) hours as needed for nausea or vomiting. 11/11/20   Raiford Noble Latif, DO  pantoprazole (PROTONIX) 40 MG tablet Take 1 tablet (40 mg total) by mouth 2 (two) times daily before a meal. Patient taking differently: Take 40 mg by mouth 2 (two) times daily. 11/11/20   Raiford Noble Latif, DO  predniSONE (DELTASONE) 5 MG tablet Take 5 mg by mouth daily. 05/16/19   [provider]  rosuvastatin (CRESTOR) 5 MG tablet Take 5 mg by mouth daily at 6 PM. 05/02/19   [provider]  simethicone (MYLICON) 80 MG chewable tablet Chew 80 mg by mouth every 6 (six) hours as needed for flatulence.    [provider]  sodium bicarbonate 650 MG tablet Take 1 tablet (650 mg total) by mouth 3 (three) times daily. 11/11/20   Raiford Noble Latif, DO  tacrolimus (PROGRAF) 0.5 MG capsule Take 1 capsule (0.5 mg total) by mouth daily. 11/12/20   Raiford Noble Latif, DO  tacrolimus (PROGRAF) 0.5 MG capsule Take 1 capsule (0.5 mg total) by mouth at bedtime. Patient not taking: No sig reported 11/11/20   Raiford Noble Latif, DO  valGANciclovir (VALCYTE) 450 MG tablet Take 450 mg  by mouth every other day.    [provider]    Allergies    Enalapril maleate, Penicillins, Chocolate, and Tape  Review of Systems   Review of Systems  Unable to perform ROS: Mental status change  Constitutional:  Negative for fever.  Respiratory:  Positive for shortness of breath. Negative for cough.   Cardiovascular:  Positive for chest pain.  Gastrointestinal:  Positive for nausea.   Physical Exam Updated Vital Signs BP (!) 148/78   Pulse 86   Resp (!) 22   SpO2 100%   Physical Exam Vitals and nursing note reviewed.  Constitutional:      General: She is not in acute distress.    Appearance: Normal appearance. She is well-developed.  HENT:     Head: Normocephalic and atraumatic.  Eyes:     Conjunctiva/sclera: Conjunctivae normal.  Cardiovascular:     Rate and Rhythm: Normal rate and regular rhythm.     Heart sounds: No murmur heard. Pulmonary:     Effort: Pulmonary effort is normal. No respiratory distress.     Breath sounds: Normal breath sounds.  Abdominal:     Palpations: Abdomen is soft.     Tenderness: There is no abdominal tenderness. There is no guarding or rebound.  Musculoskeletal:        General: No tenderness.     Cervical back: Neck supple.     Comments: Is a fistula left upper arm with positive thrill  Skin:    General: Skin is warm and dry.     Findings: Bruising present.  Neurological:     General: No focal deficit present.     Comments: Patient is resting quietly although will open eyes to voice.  Moving all extremities without any focal deficits.  Neurologic exam.    ED Results / Procedures / Treatments   Labs (all labs ordered are listed,  but only abnormal results are displayed) Labs Reviewed  COMPREHENSIVE METABOLIC PANEL - Abnormal; Notable for the following components:      Result Value   CO2 18 (*)    Glucose, Bld 120 (*)    BUN 124 (*)    Creatinine, Ser 5.01 (*)    Albumin 3.1 (*)    Alkaline Phosphatase 138 (*)     Total Bilirubin 1.6 (*)    GFR, Estimated 9 (*)    Anion gap 16 (*)    All other components within normal limits  BRAIN NATRIURETIC PEPTIDE - Abnormal; Notable for the following components:   B Natriuretic Peptide >4,500.0 (*)    All other components within normal limits  CBC WITH DIFFERENTIAL/PLATELET - Abnormal; Notable for the following components:   WBC 3.4 (*)    Hemoglobin 11.4 (*)    HCT 35.9 (*)    RDW 20.4 (*)    Platelets 74 (*)    nRBC 0.6 (*)    Lymphs Abs 0.2 (*)    All other components within normal limits  CBC WITH DIFFERENTIAL/PLATELET - Abnormal; Notable for the following components:   WBC 3.4 (*)    RBC 3.84 (*)    Hemoglobin 11.2 (*)    HCT 35.0 (*)    RDW 20.6 (*)    Platelets 68 (*)    nRBC 0.9 (*)    Lymphs Abs 0.2 (*)    All other components within normal limits  COMPREHENSIVE METABOLIC PANEL - Abnormal; Notable for the following components:   CO2 16 (*)    Glucose, Bld 102 (*)    BUN 127 (*)    Creatinine, Ser 4.99 (*)    Albumin 3.0 (*)    Alkaline Phosphatase 130 (*)    Total Bilirubin 1.5 (*)    GFR, Estimated 9 (*)    Anion gap 16 (*)    All other components within normal limits  MAGNESIUM - Abnormal; Notable for the following components:   Magnesium 2.9 (*)    All other components within normal limits  TROPONIN I (HIGH SENSITIVITY) - Abnormal; Notable for the following components:   Troponin I (High Sensitivity) 184 (*)    All other components within normal limits  TROPONIN I (HIGH SENSITIVITY) - Abnormal; Notable for the following components:   Troponin I (High Sensitivity) 183 (*)    All other components within normal limits  TROPONIN I (HIGH SENSITIVITY) - Abnormal; Notable for the following components:   Troponin I (High Sensitivity) 179 (*)    All other components within normal limits  RESP PANEL BY RT-PCR (FLU A&B, COVID) ARPGX2  CULTURE, BLOOD (ROUTINE X 2)  CULTURE, BLOOD (ROUTINE X 2)  LIPASE, BLOOD  PROCALCITONIN  LIPID  PANEL  BLOOD GAS, VENOUS  C-REACTIVE PROTEIN    EKG EKG Interpretation  Date/Time:  Saturday December 06 2020 19:52:07 EDT Ventricular Rate:  83 PR Interval:  146 QRS Duration: 132 QT Interval:  435 QTC Calculation: 512 R Axis:   192 Text Interpretation: Sinus rhythm Multiple premature complexes, vent & supraven Nonspecific intraventricular conduction delay Probable anteroseptal infarct, recent Minimal ST elevation, inferior leads Lateral leads are also involved lateral changes worse than prior 9/22 Confirmed by Aletta Edouard [phone]) on 12/06/2020 8:07:02 PM  Radiology DG Chest Port 1 View  Result Date: 12/06/2020 CLINICAL DATA:  Chest pain. EXAM: PORTABLE CHEST 1 VIEW COMPARISON:  November 17, 2020 FINDINGS: Moderate severity dense airspace opacity is again seen within the right lower lobe, decreased  in severity when compared to the prior study. Predominant stable left basilar opacity is seen. Very small, stable bilateral pleural effusions are seen. No pneumothorax is identified. The cardiac silhouette is markedly enlarged and unchanged in size. The visualized skeletal structures are unremarkable. IMPRESSION: 1. Moderate severity dense right lower lobe airspace opacity, decreased in severity when compared to the prior study. 2. Stable left basilar opacity, consistent with atelectasis and/or infiltrate. 3. Very small, stable bilateral pleural effusions. Electronically Signed   By: Virgina Norfolk M.D.   On: 12/06/2020 20:35    Procedures Procedures   Medications Ordered in ED Medications  aspirin EC tablet 81 mg (has no administration in time range)  hydroxychloroquine (PLAQUENIL) tablet 200 mg (has no administration in time range)  valGANciclovir (VALCYTE) 450 MG tablet TABS 450 mg (has no administration in time range)  isosorbide-hydrALAZINE (BIDIL) 20-37.5 MG per tablet 1 tablet (1 tablet Oral Patient Refused/Not Given 12/07/20 0204)  nitroGLYCERIN (NITROSTAT) SL tablet 0.4 mg  (has no administration in time range)  rosuvastatin (CRESTOR) tablet 5 mg (has no administration in time range)  citalopram (CELEXA) tablet 10 mg (has no administration in time range)  predniSONE (DELTASONE) tablet 5 mg (has no administration in time range)  pantoprazole (PROTONIX) EC tablet 40 mg (has no administration in time range)  sodium bicarbonate tablet 650 mg (has no administration in time range)  tacrolimus (PROGRAF) capsule 0.5 mg (has no administration in time range)  heparin injection 5,000 Units (has no administration in time range)  acetaminophen (TYLENOL) tablet 650 mg (has no administration in time range)    Or  acetaminophen (TYLENOL) suppository 650 mg (has no administration in time range)  ondansetron (ZOFRAN) tablet 4 mg (has no administration in time range)    Or  ondansetron (ZOFRAN) injection 4 mg (has no administration in time range)  polyethylene glycol (MIRALAX / GLYCOLAX) packet 17 g (has no administration in time range)  albuterol (PROVENTIL) (2.5 MG/3ML) 0.083% nebulizer solution 2.5 mg (has no administration in time range)  furosemide (LASIX) injection 80 mg (80 mg Intravenous Given 12/07/20 0204)    ED Course  I have reviewed the triage vital signs and the nursing notes.  Pertinent labs & imaging results that were available during my care of the patient were reviewed by me and considered in my medical decision making (see chart for details).  Clinical Course as of 12/07/20 1035  Sat Dec 06, 2020  2041 Chest x-ray showing cardiomegaly possible infiltrate right base.  Awaiting radiology reading. [MB]  2124 Patient's troponin and BNP elevated and similar to priors.  Creatinine also elevated although potassium okay. [MB]  2124 Will review with daughter for goals of care. [MB]  2215 Daughter is very worried that the patient is not taking much to eat or drink.  She is not sure that going back to the facility is the best for her right now although she understands  the limitations of what we can provide for her. [MB]  2215 Discussed with Dr. Cyd Silence from [facility] hospitalist who will evaluate the patient and discussed with family goals of care. [MB]    Clinical Course User Index [MB] Hayden Rasmussen, MD   MDM Rules/Calculators/A&P                          This patient complains of chest pain shortness of breath lethargy worsening renal failure poor p.o. intake; this involves an extensive number of treatment Options and is  a complaint that carries with it a high risk of complications and Morbidity. The differential includes renal failure, ACS, pneumonia thorax, PE, vascular, fluid overload metabolic derangement, encephalopathy  I ordered, reviewed and interpreted labs, which included CBC with low white count similar to priors, hemoglobin stable from priors, chemistries with low bicarb elevated BUN and creatinine similar to priors, troponins elevated will need to be trended, BNP max high, COVID and flu negative  I ordered imaging studies which included chest x-ray and I independently    visualized and interpreted imaging which showed bibasilar infiltrates Additional history obtained from EMS and patient's daughter Previous records obtained and reviewed in epic including recent admission and discharged to nursing home I consulted Dr. Cyd Silence [facility] hospital[facility] and discussed lab and imaging findings  Critical Interventions: None  After the interventions stated above, I reevaluated the patient and found patient to be resting more comfortably although still not normal awake and alert.  Due to her complex medical condition and still full code, hospice feel admission to see if there is any reversible option for patient.  Discussed with daughter he is agreeable to plan.   Final Clinical Impression(s) / ED Diagnoses Final diagnoses:  Chest pain, unspecified type  Chronic kidney disease, unspecified CKD stage  Encephalopathy acute    Rx / DC Orders ED  Discharge Orders     None        Hayden Rasmussen, MD 12/07/20 1042

## 2020-12-06 NOTE — ED Triage Notes (Signed)
Pt BIB [facility] from [place] c/o CP that started about an hr ago. Per daughter pt has been lethargic for a couple days not wanting to eat or drink anything. Facility gave the pt 3 doses of Nitro and an oxycodone, this was per daughter staff did not speak to EMS just gave paperwork. Pt is a dialysis pt, has a hx of a-fib and MI's.

## 2020-12-06 NOTE — Assessment & Plan Note (Addendum)
   Patient presenting with ongoing shortness of breath with known pulmonary edema in the setting of advanced congestive heart failure and very limited therapeutic options   Chest x-ray revealing persisting pulmonary edema with bilateral pleural effusions and infiltrates  Exam additionally reveals significant rales and extensive bilateral lower extremity pitting edema  Patient felt to not be a candidate for resumption of hemodialysis per discussion with nephrology during last hospitalization in early October  Will attempt to give a one-time dose of 80 mg of Lasix to attempt to achieve some degree of diuresis  Furthermore there seem to be superimposed bibasilar infiltrates which may simply be atelectasis in the absence of fever or leukocytosis.  Will obtain a procalcitonin and CRP to attempt evaluate further and will consider treating with antibiotics if patient exhibits any signs of SIRS or other evidence of progressive infection  Supplemental oxygen for bouts of hypoxia  As needed bronchodilator therapy for shortness of breath and wheezing

## 2020-12-07 ENCOUNTER — Observation Stay (HOSPITAL_COMMUNITY): Payer: Medicare Other

## 2020-12-07 DIAGNOSIS — Z7189 Other specified counseling: Secondary | ICD-10-CM | POA: Diagnosis not present

## 2020-12-07 DIAGNOSIS — D631 Anemia in chronic kidney disease: Secondary | ICD-10-CM | POA: Diagnosis present

## 2020-12-07 DIAGNOSIS — N179 Acute kidney failure, unspecified: Secondary | ICD-10-CM | POA: Diagnosis not present

## 2020-12-07 DIAGNOSIS — I132 Hypertensive heart and chronic kidney disease with heart failure and with stage 5 chronic kidney disease, or end stage renal disease: Principal | ICD-10-CM

## 2020-12-07 DIAGNOSIS — E440 Moderate protein-calorie malnutrition: Secondary | ICD-10-CM | POA: Diagnosis present

## 2020-12-07 DIAGNOSIS — N186 End stage renal disease: Secondary | ICD-10-CM | POA: Diagnosis present

## 2020-12-07 DIAGNOSIS — Z940 Kidney transplant status: Secondary | ICD-10-CM | POA: Diagnosis not present

## 2020-12-07 DIAGNOSIS — B259 Cytomegaloviral disease, unspecified: Secondary | ICD-10-CM | POA: Diagnosis present

## 2020-12-07 DIAGNOSIS — Z20822 Contact with and (suspected) exposure to covid-19: Secondary | ICD-10-CM | POA: Diagnosis present

## 2020-12-07 DIAGNOSIS — G9340 Encephalopathy, unspecified: Secondary | ICD-10-CM | POA: Diagnosis not present

## 2020-12-07 DIAGNOSIS — G9349 Other encephalopathy: Secondary | ICD-10-CM | POA: Diagnosis present

## 2020-12-07 DIAGNOSIS — N2581 Secondary hyperparathyroidism of renal origin: Secondary | ICD-10-CM | POA: Diagnosis present

## 2020-12-07 DIAGNOSIS — I472 Ventricular tachycardia, unspecified: Secondary | ICD-10-CM | POA: Diagnosis not present

## 2020-12-07 DIAGNOSIS — Z515 Encounter for palliative care: Secondary | ICD-10-CM | POA: Diagnosis not present

## 2020-12-07 DIAGNOSIS — N185 Chronic kidney disease, stage 5: Secondary | ICD-10-CM | POA: Diagnosis not present

## 2020-12-07 DIAGNOSIS — E669 Obesity, unspecified: Secondary | ICD-10-CM | POA: Diagnosis present

## 2020-12-07 DIAGNOSIS — N189 Chronic kidney disease, unspecified: Secondary | ICD-10-CM | POA: Diagnosis not present

## 2020-12-07 DIAGNOSIS — M329 Systemic lupus erythematosus, unspecified: Secondary | ICD-10-CM | POA: Diagnosis present

## 2020-12-07 DIAGNOSIS — Z6832 Body mass index (BMI) 32.0-32.9, adult: Secondary | ICD-10-CM | POA: Diagnosis not present

## 2020-12-07 DIAGNOSIS — R079 Chest pain, unspecified: Secondary | ICD-10-CM | POA: Diagnosis not present

## 2020-12-07 DIAGNOSIS — G459 Transient cerebral ischemic attack, unspecified: Secondary | ICD-10-CM | POA: Diagnosis not present

## 2020-12-07 DIAGNOSIS — I513 Intracardiac thrombosis, not elsewhere classified: Secondary | ICD-10-CM | POA: Diagnosis present

## 2020-12-07 DIAGNOSIS — I252 Old myocardial infarction: Secondary | ICD-10-CM | POA: Diagnosis not present

## 2020-12-07 DIAGNOSIS — I1310 Hypertensive heart and chronic kidney disease without heart failure, with stage 1 through stage 4 chronic kidney disease, or unspecified chronic kidney disease: Secondary | ICD-10-CM | POA: Diagnosis present

## 2020-12-07 DIAGNOSIS — R0602 Shortness of breath: Secondary | ICD-10-CM | POA: Diagnosis present

## 2020-12-07 DIAGNOSIS — I501 Left ventricular failure: Secondary | ICD-10-CM | POA: Diagnosis not present

## 2020-12-07 DIAGNOSIS — J309 Allergic rhinitis, unspecified: Secondary | ICD-10-CM | POA: Diagnosis present

## 2020-12-07 DIAGNOSIS — D696 Thrombocytopenia, unspecified: Secondary | ICD-10-CM | POA: Diagnosis present

## 2020-12-07 DIAGNOSIS — I2510 Atherosclerotic heart disease of native coronary artery without angina pectoris: Secondary | ICD-10-CM | POA: Diagnosis present

## 2020-12-07 DIAGNOSIS — I5023 Acute on chronic systolic (congestive) heart failure: Secondary | ICD-10-CM | POA: Diagnosis present

## 2020-12-07 DIAGNOSIS — F32A Depression, unspecified: Secondary | ICD-10-CM | POA: Diagnosis present

## 2020-12-07 DIAGNOSIS — Z66 Do not resuscitate: Secondary | ICD-10-CM | POA: Diagnosis not present

## 2020-12-07 DIAGNOSIS — I129 Hypertensive chronic kidney disease with stage 1 through stage 4 chronic kidney disease, or unspecified chronic kidney disease: Secondary | ICD-10-CM | POA: Diagnosis not present

## 2020-12-07 DIAGNOSIS — E872 Acidosis, unspecified: Secondary | ICD-10-CM | POA: Diagnosis present

## 2020-12-07 LAB — PROCALCITONIN: Procalcitonin: 0.69 ng/mL

## 2020-12-07 LAB — ECHOCARDIOGRAM COMPLETE
AR max vel: 2.09 cm2
AV Area VTI: 2.04 cm2
AV Area mean vel: 2.26 cm2
AV Mean grad: 5 mmHg
AV Peak grad: 11.9 mmHg
Ao pk vel: 1.72 m/s
Area-P 1/2: 5.84 cm2
Calc EF: 23.8 %
MV M vel: 4.58 m/s
MV Peak grad: 83.9 mmHg
MV VTI: 2.05 cm2
Radius: 0.5 cm
S' Lateral: 3.9 cm
Single Plane A2C EF: 20.5 %
Single Plane A4C EF: 24.2 %

## 2020-12-07 LAB — BLOOD GAS, ARTERIAL
Acid-base deficit: 6.9 mmol/L — ABNORMAL HIGH (ref 0.0–2.0)
Bicarbonate: 16.6 mmol/L — ABNORMAL LOW (ref 20.0–28.0)
Drawn by: 53309
FIO2: 21
O2 Saturation: 97.7 %
Patient temperature: 37
pCO2 arterial: 25.3 mmHg — ABNORMAL LOW (ref 32.0–48.0)
pH, Arterial: 7.432 (ref 7.350–7.450)
pO2, Arterial: 91.5 mmHg (ref 83.0–108.0)

## 2020-12-07 LAB — COMPREHENSIVE METABOLIC PANEL
ALT: 18 U/L (ref 0–44)
AST: 28 U/L (ref 15–41)
Albumin: 3 g/dL — ABNORMAL LOW (ref 3.5–5.0)
Alkaline Phosphatase: 130 U/L — ABNORMAL HIGH (ref 38–126)
Anion gap: 16 — ABNORMAL HIGH (ref 5–15)
BUN: 127 mg/dL — ABNORMAL HIGH (ref 8–23)
CO2: 16 mmol/L — ABNORMAL LOW (ref 22–32)
Calcium: 9.7 mg/dL (ref 8.9–10.3)
Chloride: 107 mmol/L (ref 98–111)
Creatinine, Ser: 4.99 mg/dL — ABNORMAL HIGH (ref 0.44–1.00)
GFR, Estimated: 9 mL/min — ABNORMAL LOW (ref >60)
Glucose, Bld: 102 mg/dL — ABNORMAL HIGH (ref 70–99)
Potassium: 4.2 mmol/L (ref 3.5–5.1)
Sodium: 139 mmol/L (ref 135–145)
Total Bilirubin: 1.5 mg/dL — ABNORMAL HIGH (ref 0.3–1.2)
Total Protein: 6.6 g/dL (ref 6.5–8.1)

## 2020-12-07 LAB — I-STAT VENOUS BLOOD GAS, ED
Acid-base deficit: 4 mmol/L — ABNORMAL HIGH (ref 0.0–2.0)
Bicarbonate: 19 mmol/L — ABNORMAL LOW (ref 20.0–28.0)
Calcium, Ion: 1.18 mmol/L (ref 1.15–1.40)
HCT: 37 % (ref 36.0–46.0)
Hemoglobin: 12.6 g/dL (ref 12.0–15.0)
O2 Saturation: 91 %
Patient temperature: 37
Potassium: 4.1 mmol/L (ref 3.5–5.1)
Sodium: 141 mmol/L (ref 135–145)
TCO2: 20 mmol/L — ABNORMAL LOW (ref 22–32)
pCO2, Ven: 27.9 mmHg — ABNORMAL LOW (ref 44.0–60.0)
pH, Ven: 7.441 — ABNORMAL HIGH (ref 7.250–7.430)
pO2, Ven: 57 mmHg — ABNORMAL HIGH (ref 32.0–45.0)

## 2020-12-07 LAB — CBC WITH DIFFERENTIAL/PLATELET
Abs Immature Granulocytes: 0.03 10*3/uL (ref 0.00–0.07)
Basophils Absolute: 0 10*3/uL (ref 0.0–0.1)
Basophils Relative: 0 %
Eosinophils Absolute: 0 10*3/uL (ref 0.0–0.5)
Eosinophils Relative: 0 %
HCT: 35 % — ABNORMAL LOW (ref 36.0–46.0)
Hemoglobin: 11.2 g/dL — ABNORMAL LOW (ref 12.0–15.0)
Immature Granulocytes: 1 %
Lymphocytes Relative: 6 %
Lymphs Abs: 0.2 10*3/uL — ABNORMAL LOW (ref 0.7–4.0)
MCH: 29.2 pg (ref 26.0–34.0)
MCHC: 32 g/dL (ref 30.0–36.0)
MCV: 91.1 fL (ref 80.0–100.0)
Monocytes Absolute: 0.4 10*3/uL (ref 0.1–1.0)
Monocytes Relative: 12 %
Neutro Abs: 2.8 10*3/uL (ref 1.7–7.7)
Neutrophils Relative %: 81 %
Platelets: 68 10*3/uL — ABNORMAL LOW (ref 150–400)
RBC: 3.84 MIL/uL — ABNORMAL LOW (ref 3.87–5.11)
RDW: 20.6 % — ABNORMAL HIGH (ref 11.5–15.5)
WBC: 3.4 10*3/uL — ABNORMAL LOW (ref 4.0–10.5)
nRBC: 0.9 % — ABNORMAL HIGH (ref 0.0–0.2)

## 2020-12-07 LAB — LIPID PANEL
Cholesterol: 117 mg/dL (ref 0–200)
HDL: 44 mg/dL (ref >40)
LDL Cholesterol: 50 mg/dL (ref 0–99)
Total CHOL/HDL Ratio: 2.7 RATIO
Triglycerides: 114 mg/dL (ref <150)
VLDL: 23 mg/dL (ref 0–40)

## 2020-12-07 LAB — TROPONIN I (HIGH SENSITIVITY)
Troponin I (High Sensitivity): 179 ng/L (ref <18)
Troponin I (High Sensitivity): 198 ng/L (ref <18)

## 2020-12-07 LAB — MAGNESIUM: Magnesium: 2.9 mg/dL — ABNORMAL HIGH (ref 1.7–2.4)

## 2020-12-07 MED ORDER — PANTOPRAZOLE SODIUM 40 MG IV SOLR
40.0000 mg | Freq: Two times a day (BID) | INTRAVENOUS | Status: DC
  Administered 2020-12-08 – 2020-12-14 (×14): 40 mg via INTRAVENOUS
  Filled 2020-12-07 (×18): qty 40

## 2020-12-07 MED ORDER — PERFLUTREN LIPID MICROSPHERE
1.0000 mL | INTRAVENOUS | Status: AC | PRN
  Administered 2020-12-07: 2 mL via INTRAVENOUS
  Filled 2020-12-07: qty 10

## 2020-12-07 MED ORDER — NALOXONE HCL 0.4 MG/ML IJ SOLN: 0.4000 mg | INTRAMUSCULAR | Status: DC | PRN

## 2020-12-07 MED ORDER — FENTANYL CITRATE PF 50 MCG/ML IJ SOSY
12.5000 ug | PREFILLED_SYRINGE | INTRAMUSCULAR | Status: DC | PRN
  Administered 2020-12-07 – 2020-12-08 (×2): 12.5 ug via INTRAVENOUS
  Filled 2020-12-07 (×2): qty 1

## 2020-12-07 MED ORDER — FUROSEMIDE 10 MG/ML IJ SOLN
80.0000 mg | Freq: Two times a day (BID) | INTRAMUSCULAR | Status: DC
  Administered 2020-12-07 – 2020-12-11 (×8): 80 mg via INTRAVENOUS
  Filled 2020-12-07 (×8): qty 8

## 2020-12-07 MED ORDER — NALOXONE HCL 0.4 MG/ML IJ SOLN
INTRAMUSCULAR | Status: AC
  Administered 2020-12-07: 0.4 mg
  Filled 2020-12-07: qty 1

## 2020-12-07 NOTE — Assessment & Plan Note (Addendum)
   According to the daughter the patient has been complaining of chest discomfort since approximately 6 PM this evening  No evidence of dynamic ST segment change  Troponin is slightly elevated at 184, a significant increase compared to previous troponin sample obtained 3 weeks ago  Patient continuing to complain of mild chest discomfort here in the emergency department which will be managed with as needed nitroglycerin as blood pressure tolerates  Patient is not a candidate for cardiac catheterization considering her advanced tenuous renal function  Continue to cycle cardiac enzymes  Monitoring patient on telemetry  Will consider increase of long-acting nitrate if chest discomfort continues to persist

## 2020-12-07 NOTE — Evaluation (Signed)
Clinical/Bedside Swallow Evaluation Patient Details  Name: Jenna [name] MRN: [id number] Date of Birth: 1955/06/06  Today's Date: 12/07/2020 Time: SLP Start Time (ACUTE ONLY): 29 SLP Stop Time (ACUTE ONLY): 1100 SLP Time Calculation (min) (ACUTE ONLY): 20 min  Past Medical History:  Past Medical History:  Diagnosis Date   Anemia    Anxiety    panic attack- talks herself and takes deep breathes   CAD (coronary artery disease)    a. STEMI 10/2018 s/p DES to LAD.   Depression    Dyspnea    with exertion    ESRD (end stage renal disease) ([place])    hemo TTHSAT   Essential hypertension    FSGS (focal segmental glomerulosclerosis) 2011   By renal biopsy   Head injury    age 65   HFrEF (heart failure with reduced ejection fraction) ([place])    History of kidney stones    kidney stone   Hx of lupus nephritis 2011   by renal biopsy   Ischemic cardiomyopathy    Kidney transplant recipient    LA thrombus 10/2018   Lupus (systemic lupus erythematosus) ([place])    followed by Dr. 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 remote parietal infarct on brain MRI 12/2019.   Pericarditis    age 9ish   Renal stone    Secondary hyperparathyroidism of renal origin ([place])    SLE (systemic lupus erythematosus) ([name])    TIA (transient ischemic attack)    no residual effects   Past Surgical History:  Past Surgical History:  Procedure Laterality Date   AV FISTULA PLACEMENT Left 07/04/2017   Procedure: ARTERIOVENOUS (AV) FISTULA CREATION BRACHIOCEPHALIC;  Surgeon: [name], MD;  Location: [place];  Service: Vascular;  Laterality: Left;   COLONOSCOPY W/ POLYPECTOMY     IR FLUORO GUIDE CV LINE RIGHT  06/28/2017   IR US GUIDE VASC ACCESS RIGHT  06/28/2017   KIDNEY SURGERY     kidney transplant 2020   REVISION OF ARTERIOVENOUS GORETEX GRAFT Left 11/30/2017   Procedure: REVISION OF ARTERIOVENOUS FISTULA  LEFT ARM Superfistulization and branch ligation.;  Surgeon: Waynetta Sandy, MD;  Location: [place];  Service: Vascular;  Laterality: Left;   HPI:  64 Year old female admitted with SOB and lethargy, worsening since prior admission to [facility] from 9/29 until 10/10.  Initial work-up revealed a significant right lower lobe opacity on chest x-ray with stable left basilar opacity and bilateral pleural effusions.  During prior hospitalization patient was managed for acute on chronic congestive heart failure thought to be secondary to decreasing urine output from progressive cardiorenal syndrome.  Patient was evaluated by nephrology but was felt to not be able to tolerate reinitiating hemodialysis.  Patient did have some improvement in symptoms and after coordination with the family was discharged back to her skilled nursing facility on 10/10. Pt previously evaluated by SLP on 07/12/20 - recommended regular, thin, but appetite noted to be poor. Had dentures, but reported she didnt need them.    Assessment / Plan / Recommendation  Clinical Impression  Patient presents with clinical s/s dysphagia as per this limited bedside swallow evaluation. Patient was lethargic but able to be briefly alert for small amount of PO intake. Her voice was low in intensity, mildly harsh in quality. Oral mucosa was dry/sticky but no secretions observed during oral care. Patient accepted  two very small sips of thin liquids (water) via straw sips, during which she exhibited oral holding and swallow initiation delay but no overt s/s aspiration or penetration. Daughter arrived after SLP had finished working with patient and she was able to provide some recent history related to patient's PO intake/swallowing. Daughter reported that patient's PO intake has been very poor and she has had instances of vomiting, regurgitating liquid PO's after drinking them. SLP to follow patient and continue swallow assessment when she is able to  be more alert. SLP Visit Diagnosis: Dysphagia, unspecified (R13.10)    Aspiration Risk  Mild aspiration risk;Risk for inadequate nutrition/hydration    Diet Recommendation Other (Comment) (Unable to recommend a particular diet consistency as patient lethargic and minimally alert.)   Liquid Administration via: Cup;Straw Medication Administration: Whole meds with puree Supervision: Full supervision/cueing for compensatory strategies;Staff to assist with self feeding Compensations: Slow rate;Small sips/bites Postural Changes: Seated upright at 90 degrees    Other  Recommendations Oral Care Recommendations: Oral care BID;Staff/trained caregiver to provide oral care    Recommendations for follow up therapy are one component of a multi-disciplinary discharge planning process, led by the attending physician.  Recommendations may be updated based on patient status, additional functional criteria and insurance authorization.  Follow up Recommendations Other (comment) (TBD)      Frequency and Duration min 1 x/week  1 week       Prognosis Prognosis for Safe Diet Advancement: Fair Barriers to Reach Goals: Other (Comment) Barriers/Prognosis Comment: patient has been lethargic and ability to participate suspected to be limited      Swallow Study   General Date of Onset: 12/06/20 HPI: 65 Year old female admitted with SOB and lethargy, worsening since prior admission to [facility] from 9/29 until 10/10.  Initial work-up revealed a significant right lower lobe opacity on chest x-ray with stable left basilar opacity and bilateral pleural effusions.  During prior hospitalization patient was managed for acute on chronic congestive heart failure thought to be secondary to decreasing urine output from progressive cardiorenal syndrome.  Patient was evaluated by nephrology but was felt to not be able to tolerate reinitiating hemodialysis.  Patient did have some improvement in symptoms and after  coordination with the family was discharged back to her skilled nursing facility on 10/10. Pt previously evaluated by SLP on 07/12/20 - recommended regular, thin, but appetite noted to be poor. Had dentures, but reported she didnt need them. Type of Study: Bedside Swallow Evaluation Previous Swallow Assessment: during previous admission 07/12/2020 Diet Prior to this Study: Regular;Thin liquids Temperature Spikes Noted: No Respiratory Status: Room air History of Recent Intubation: No Behavior/Cognition: Lethargic/Drowsy Oral Cavity Assessment: Other (comment) (mildly dry/stick oral mucosa) Oral Care Completed by SLP: Yes Oral Cavity - Dentition: Edentulous Self-Feeding Abilities: Total assist Patient Positioning: Partially reclined Baseline Vocal Quality: Low vocal intensity Volitional Cough: Cognitively unable to elicit Volitional Swallow: Unable to elicit    Oral/Motor/Sensory Function Overall Oral Motor/Sensory Function: Other (comment) (patient unable to participate in full oral motor evaluation but did not present with any assymetry, facial droop or flaccid oral motor musculature.)   Ice Chips     Thin Liquid Thin Liquid: Impaired Presentation: Straw Oral Phase Functional Implications: Oral holding Pharyngeal  Phase Impairments: Suspected delayed Swallow    Nectar Thick     Honey Thick     Puree Puree: Not tested   Solid     Solid: Not tested     Sonia Baller, MA,  CCC-SLP Speech Therapy

## 2020-12-07 NOTE — Assessment & Plan Note (Signed)
   Prognosis exceedingly poor with limited therapeutic options due to advanced congestive heart failure, advanced renal disease and development of cardiorenal syndrome  Discussed overall poor prognosis with the daughter who is willing to continue to discuss goals of care with palliative care, consultation placed  However, daughter states that she still wishes for the patient to remain full code at this time.

## 2020-12-07 NOTE — ED Notes (Signed)
No urine output. MD notified.

## 2020-12-07 NOTE — Assessment & Plan Note (Signed)
   Patient currently in atrial fibrillation however rate controlled  Patient not on anticoagulation per home regimen  Monitoring on telemetry

## 2020-12-07 NOTE — Assessment & Plan Note (Signed)
   Significant decline in renal function over the past several months which is thought to be secondary to progressive cardiorenal syndrome per review of notes during last hospitalization  Creatinine seems to be stable compared to recent hospitalization however.  Regardless, patient is continuing to suffer from the sequela of advanced renal disease with persisting volume overload and uremic encephalopathy  Patient evaluated by nephrology during most recent hospitalization earlier in the month and at the time it was mentioned that patient is likely not a candidate for resumption of dialysis  Will discuss any potential therapeutic options for this patient with nephrology in the morning  We will additionally place order for palliative care consultation in the morning

## 2020-12-07 NOTE — Progress Notes (Signed)
Patient arrived from ED at 1815hrs.  Shaking head and moaning to painful stimuli.  Not responding to any questions.  Rapid response here and narcan 0.4 mg IV given with slight response noted.  Dr. Maylene Roes notified.  Orders placed and she will come to see patient.

## 2020-12-07 NOTE — Progress Notes (Signed)
   12/07/20 1615  Assess: MEWS Score  Temp 98.1 F (36.7 C)  BP (!) 145/91  Pulse Rate 82  ECG Heart Rate 91  Resp 16  SpO2 98 %  O2 Device Room Air  Assess: MEWS Score  MEWS Temp 0  MEWS Systolic 0  MEWS Pulse 0  MEWS RR 0  MEWS LOC 2  MEWS Score 2  MEWS Score Color Yellow  Assess: if the MEWS score is Yellow or Red  Were vital signs taken at a resting state? Yes  Focused Assessment Change from prior assessment (see assessment flowsheet)  Early Detection of Sepsis Score *See Row Information* Low  MEWS guidelines implemented *See Row Information* Yes  Treat  MEWS Interventions Other (Comment) (see orders)  Escalate  MEWS: Escalate Yellow: discuss with charge nurse/RN and consider discussing with provider and RRT  Notify: Charge Nurse/RN  Name of Charge Nurse/RN Notified Yoko  Date Charge Nurse/RN Notified 12/07/20  Time Charge Nurse/RN Notified 1708  Notify: Provider  Provider Name/Title Dr. Maylene Roes  Date Provider Notified 12/07/20  Time Provider Notified 1620  Notification Type Page  Notification Reason Change in status  Provider response At bedside  Date of Provider Response 12/07/20  Time of Provider Response 1630  Notify: Rapid Response  Name of Rapid Response RN Notified Saralyn Pilar RN  Date Rapid Response Notified 12/07/20  Time Rapid Response Notified 1834  Document  Patient Outcome Other (Comment) (remains non-verbal)  Progress note created (see row info) Yes

## 2020-12-07 NOTE — Significant Event (Signed)
Rapid Response Event Note   Reason for Call :  Unresponsive patient  Initial Focused Assessment:  While on the unit the RN asked me to evaluate this patient. They are worried about her neuro status. The patient was lethargic in the ED but would answer simple questions. When I saw her, she was unresponsive to my voice, but had withdrawal from pain and blinked when a few saline drops were put in her eyes. Vital signs stable. Patient did not flinch when the ABG was drawn on her.   Although no narcotics have been administered during this visit, she did receive an unknown amount of oxy at her facility for chest pain. Due to her poor renal status, I made the decision to administer 0.4 Narcan. She started moaning after the medication was administered. Vital signs stable. Patient came to hospital with two clysis needles and tubing attached. Clysis needles removed to decrease risk of infection  BP 145/91 HR 82 O2 98 on room air  Interventions:  Narcan administered, POC discussed with MD  Plan of Care:  The patient will have labs drawn, ABG, and head ct ordered.    Event Summary:   MD Notified: Md Lindwood Qua, RN

## 2020-12-07 NOTE — Assessment & Plan Note (Signed)
   Patient exhibiting substantial lethargy during my interview and evaluation with her  Daughter reports the patient has been exhibiting increasing lethargy, confusion and extremely poor oral intake since her discharge from the hospital on 10/10  Patient is likely suffering from progressive uremic encephalopathy due to advanced renal disease  This is also likely exacerbated this evening by administration of Percocet for her chest discomfort by the skilled nursing facility staff  Supportive care, attempting to avoid sedating agents

## 2020-12-07 NOTE — Assessment & Plan Note (Signed)
   Longstanding known history of significant coronary artery disease  Presentation with chest discomfort of unclear etiology  Continuing home regimen of antiplatelet therapy, statin therapy  Monitoring patient on telemetry  Otherwise, management of chest pain as above

## 2020-12-07 NOTE — Progress Notes (Signed)
  Echocardiogram 2D Echocardiogram has been performed.  Bobbye Charleston 12/07/2020, 2:25 PM

## 2020-12-07 NOTE — Assessment & Plan Note (Signed)
   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

## 2020-12-07 NOTE — Progress Notes (Signed)
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   Of note, patient was recently hospitalized at [facility] from 9/29-10/10 during that hospitalization patient was managed for acute on chronic congestive heart failure thought to be secondary to decreasing urine output from progressive cardiorenal syndrome.  Patient was evaluated by nephrology but was felt to not be able to tolerate reinitiating hemodialysis.  Patient did have some improvement in symptoms and after coordination with the family was discharged back to her skilled nursing facility on 10/10.   Daughter reports the patient has been exhibiting increasing lethargy ever since her discharge on 10/10.  This has been associated with decreasing oral intake.  Earlier in the evening at approximately 6 PM on 10/22, patient began to complain of chest discomfort.  Patient was administered 3 separate doses of nitroglycerin as well as a dose of oxycodone without any improvement in chest discomfort.     Daughter also reports that patient has exhibited progressively worsening shortness of breath since her recent hospitalization as well.     Upon evaluation in the emergency department initial work-up revealed a significant right lower lobe opacity on chest x-ray with stable left basilar opacity and bilateral pleural  effusions.  Due to ongoing symptoms of shortness of breath the hospitalist group was called to assess the patient for admission to the hospital.  New events last 24 hours / Subjective: Patient seen in the emergency department.  Patient is arousable but lethargic, she is able to answer some questions and is oriented to hospital, year, but does not stay awake enough to answer questions regarding history of presenting illness.  The only other thing she is able to tell me is that she feels cold.  States that she has been peeing, but there is no I/O recorded  Assessment & Plan:   Principal Problem:   Pulmonary edema with left heart failure ([name]) Active Problems:   Lupus (systemic lupus erythematosus) ([name])   Goals of care, counseling/discussion   CKD (chronic kidney disease) stage 5, GFR less than 15 ml/min ([name])   Coronary artery disease involving native coronary artery of native heart   Paroxysmal atrial fibrillation ([name])   Acute on chronic systolic CHF (congestive heart failure) ([name])   Cytomegalovirus (CMV) viremia ([name])   Chest pain   Uremic encephalopathy   Progressing cardiorenal failure Acute on chronic systolic heart failure -Baseline EF 20 to 25% (Echo 08/19/20 at [facility])  -Followed by outpatient cardiology at [facility], Dr. Norman Clay / Dr. Lamonte Sakai  -Chest x-ray revealing persisting pulmonary edema with bilateral pleural effusions and infiltrates, with volume overload on physical exam -She was poorly responsive to torsemide, it was discontinued previous hospitalization due to rising creatinine -BNP >4500  -IV Lasix -Strict I's and O's and daily weight   Chest pain, hx CAD  -Patient is not a candidate for cardiac catheterization considering her advanced tenuous renal function -Troponin trend has been flat 184 >> 183 >>  179 -Continue aspirin, crestor  -Check echo    CKD stage 5  -Hx of SLE with s/p renal transplant April 2020  -Followed by outpatient nephrology Dr. 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  Daughter feels that she would like to know patient's current state of heart failure in order to make a better decision going forward.  Depending on the new EF number, she may decide for or against dialysis.  May need to pursue cardiology and/or nephrology consultation pending echocardiogram result, repeat BMP on IV Lasix.      DVT prophylaxis:  heparin injection 5,000 Units Start: 12/07/20 1400  Code Status: Full code  Family Communication: None at bedside, discussed with daughter over the phone today Disposition Plan:  Status is: Observation  The patient will require care spanning > 2 midnights and should be moved to inpatient because: Decline in medical condition including cardiorenal failure     Consultants:  None   Procedures:  None  Antimicrobials:  Anti-infectives (From admission, onward)    Start     Dose/Rate Route Frequency Ordered Stop   12/08/20 1000  valGANciclovir (VALCYTE) 450 MG tablet TABS 450 mg        450 mg Oral Every other day 12/06/20 2350     12/07/20 1000  hydroxychloroquine (PLAQUENIL) tablet 200 mg       Note to Pharmacy: Recent Rx's are  Plaquenil 200 mg once daily   200 mg Oral Daily 12/06/20 2350          Objective: Vitals:   12/07/20 0600 12/07/20 0700 12/07/20 0900 12/07/20 1000  BP: (!) 152/91 (!) 155/93 (!) 153/89 (!) 151/95  Pulse: 80 80 80 77  Resp: $Remo'12 12 12 11  'jeEwN$ Temp:      TempSrc:      SpO2: 98% 98% 97% 100%   No intake or output data in the 24 hours ending 12/07/20 1227 There were no vitals filed for this visit.  Examination:  General exam: Appears calm, fatigued appearing Respiratory system: Clear to auscultation. Respiratory effort normal. No respiratory distress. On room air  Cardiovascular system: S1 & S2 heard. No murmurs. +Bilateral pitting  Gastrointestinal system: Abdomen is nondistended, soft and nontender. Normal bowel sounds heard. Central nervous system: Alert and oriented to person, hospital, year 2022  Extremities: Symmetric in appearance    Data Reviewed: I have personally reviewed following labs and imaging studies  CBC: Recent Labs  Lab 12/06/20 2014 12/07/20 0500  WBC 3.4* 3.4*  NEUTROABS 2.9 2.8  HGB 11.4* 11.2*  HCT 35.9* 35.0*  MCV 91.1 91.1  PLT 74* 68*   Basic Metabolic Panel: Recent Labs  Lab 12/06/20 2014 12/07/20 0500  NA 139 139  K 4.3 4.2  CL 105 107  CO2 18* 16*  GLUCOSE 120* 102*  BUN 124* 127*  CREATININE 5.01* 4.99*  CALCIUM 9.7 9.7  MG  --  2.9*   GFR: Estimated Creatinine Clearance: 13.2 mL/min (A) (by C-G formula based on SCr of 4.99 mg/dL (H)). Liver Function Tests: Recent Labs  Lab 12/06/20 2014 12/07/20 0500  AST 28 28  ALT 19 18  ALKPHOS 138* 130*  BILITOT 1.6* 1.5*  PROT 7.0 6.6  ALBUMIN 3.1* 3.0*   Recent Labs  Lab 12/06/20 2014  LIPASE 22   No results for input(s): AMMONIA in the last 168 hours. Coagulation Profile: No results for input(s): INR, PROTIME in the last 168 hours. Cardiac Enzymes: No results for input(s): CKTOTAL, CKMB, CKMBINDEX, TROPONINI in the last 168 hours. BNP (last 3 results) No results for  input(s): PROBNP in the last 8760 hours. HbA1C: No results for input(s): HGBA1C in the last 72 hours. CBG: No results for input(s): GLUCAP in the last 168 hours. Lipid Profile: Recent Labs    12/07/20 0500  CHOL 117  HDL 44  LDLCALC 50  TRIG 114  CHOLHDL 2.7   Thyroid Function Tests: No results for input(s): TSH, T4TOTAL, FREET4, T3FREE, THYROIDAB in the last 72 hours. Anemia Panel: No results for input(s): VITAMINB12, FOLATE, FERRITIN, TIBC, IRON, RETICCTPCT in the last 72 hours. Sepsis Labs: Recent Labs  Lab 12/07/20 0230  PROCALCITON 0.69    Recent Results (from the past 240 hour(s))  Resp Panel by RT-PCR (Flu A&B, Covid) Nasopharyngeal Swab     Status: None   Collection Time: 12/06/20  9:48 PM   Specimen: Nasopharyngeal Swab; Nasopharyngeal(NP) swabs in vial transport medium  Result Value Ref Range Status   SARS Coronavirus 2 by RT PCR NEGATIVE NEGATIVE Final    Comment: (NOTE) SARS-CoV-2 target nucleic acids are NOT DETECTED.  The SARS-CoV-2 RNA is generally detectable in upper respiratory specimens during the acute phase of infection. The lowest concentration of SARS-CoV-2 viral copies this assay can detect is 138 copies/mL. A negative result does not preclude SARS-Cov-2 infection and should not be used as the sole basis for treatment or other patient management decisions. A negative result may occur with  improper specimen collection/handling, submission of specimen other than nasopharyngeal swab, presence of viral mutation(s) within the areas targeted by this assay, and inadequate number of viral copies(<138 copies/mL). A negative result must be combined with clinical observations, patient history, and epidemiological information. The expected result is Negative.  Fact Sheet for Patients:  EntrepreneurPulse.com.au  Fact Sheet for Healthcare Providers:  IncredibleEmployment.be  This test is no t yet approved or cleared by  the [place] FDA and  has been authorized for detection and/or diagnosis of SARS-CoV-2 by FDA under an Emergency Use Authorization (EUA). This EUA will remain  in effect (meaning this test can be used) for the duration of the COVID-19 declaration under Section 564(b)(1) of the Act, 21 U.S.C.section 360bbb-3(b)(1), unless the authorization is terminated  or revoked sooner.       Influenza A by PCR NEGATIVE NEGATIVE Final   Influenza B by PCR NEGATIVE NEGATIVE Final    Comment: (NOTE) The Xpert Xpress SARS-CoV-2/FLU/RSV plus assay is intended as an aid in the diagnosis of influenza from Nasopharyngeal swab specimens and should not be used as a sole basis for treatment. Nasal washings and aspirates are unacceptable for Xpert Xpress SARS-CoV-2/FLU/RSV testing.  Fact Sheet for Patients: EntrepreneurPulse.com.au  Fact Sheet for Healthcare Providers: IncredibleEmployment.be  This test is not yet approved or cleared by the [place] FDA and has been authorized for detection and/or diagnosis of SARS-CoV-2 by FDA under an Emergency Use Authorization (EUA). This  EUA will remain in effect (meaning this test can be used) for the duration of the COVID-19 declaration under Section 564(b)(1) of the Act, 21 U.S.C. section 360bbb-3(b)(1), unless the authorization is terminated or revoked.  Performed at [facility], [place] [address], [place], [place] [address]       Radiology Studies: DG Chest Port 1 View  Result Date: 12/06/2020 CLINICAL DATA:  Chest pain. EXAM: PORTABLE CHEST 1 VIEW COMPARISON:  November 17, 2020 FINDINGS: Moderate severity dense airspace opacity is again seen within the right lower lobe, decreased in severity when compared to the prior study. Predominant stable left basilar opacity is seen. Very small, stable bilateral pleural effusions are seen. No pneumothorax is identified. The cardiac silhouette is markedly enlarged and  unchanged in size. The visualized skeletal structures are unremarkable. IMPRESSION: 1. Moderate severity dense right lower lobe airspace opacity, decreased in severity when compared to the prior study. 2. Stable left basilar opacity, consistent with atelectasis and/or infiltrate. 3. Very small, stable bilateral pleural effusions. Electronically Signed   By: Virgina Norfolk M.D.   On: 12/06/2020 20:35      Scheduled Meds:  aspirin EC  81 mg Oral q morning   citalopram  10 mg Oral Daily   heparin  5,000 Units Subcutaneous Q8H   hydroxychloroquine  200 mg Oral Daily   isosorbide-hydrALAZINE  1 tablet Oral TID   pantoprazole  40 mg Oral BID AC   predniSONE  5 mg Oral Q breakfast   rosuvastatin  5 mg Oral q1800   sodium bicarbonate  650 mg Oral TID with meals   tacrolimus  0.5 mg Oral Daily   [START ON 12/08/2020] valGANciclovir  450 mg Oral QODAY   Continuous Infusions:   LOS: 0 days      Time spent: 50 minutes   Dessa Phi, DO [facility] 12/07/2020, 12:27 PM   Available via Epic secure chat 7am-7pm After these hours, please refer to coverage provider listed on [email or web address]

## 2020-12-07 NOTE — Consult Note (Signed)
Consultation Note Date: 12/07/2020   Patient Name: Jenna [name]  DOB: [birth year]  MRN: [id number]  Age / Sex: 65 y.o., female  PCP: [name], Betty G, MD Referring Physician: Dessa Phi, DO  Reason for Consultation: Establishing goals of care  Per intake ->HPI/Patient Profile: 65 y.o. 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   Of note, patient was recently hospitalized at [facility] from 9/29 until 10/10, with 5 total admissions in the past 6 months.  PMT has been consulted to assist with goals of care conversation.  Clinical Assessment and Goals of Care:  I have reviewed medical records including EPIC notes, labs and imaging, assessed the patient and then met at the bedside along with patient's daughter Jenna [name] to discuss diagnosis prognosis, GOC, EOL wishes, disposition and options.  I introduced Palliative Medicine as specialized medical care for people living with serious illness. It focuses on providing relief from the symptoms and stress of a serious illness. The goal is to improve quality of life for both the patient and the family.  We discussed a brief life review of the patient and then focused on their current illness. The natural disease trajectory and expectations at EOL were discussed. Jenna [name] shares that she has a good understanding of patient's prognosis after discussing impact of poor nutritional intake with Dr. Cyd Silence.  She states patient has been declining all year and she understands patient is near end of life. Patient is the youngest of 1 siblings and Jenna [name] tells me that the patient's 4 living siblings also have a good understanding of her worsening health (Per chart review, patient's brother felt hospice was appropriate during last admission and agreed dialysis is not consistent with patient's goals.) Reviewed patient's renal failure, heart failure, EF of 20-25%, and the unfortunately limited options in treating either effectively while avoiding harm to the other organ system.  Similarly to previous conversations with PMT, Jenna [name] remains committed to continuing life-prolonging interventions with the hope that patient will be able to meet her first grand-daughter (she is due 11/30). She was unable to participate in the [place] discussion when outpatient palliative care conducted the first visit at [facility].  I attempted to elicit values and goals of care important to the patient.   Patient has previously stated she would not wish to return to the dialysis center. Now that patient appears closer to end of life, her daughter Jenna [name] feels willing to consider dialysis if indicated. She does value patient's quality of life and would like to improve patient's symptoms as much as possible, while continuing to prioritize and pursue all available interventions to prolong patient's life. Jenna [name] does understand  the patient is at high risk to decline rapidly and hopes to obtain as much diagnostic information as possible to inform her medical decision-making. She hopes to gain insight from further testing and specialists, as she is concerned about the ability of [facility] to provide the care needed.     Hospice and Palliative Care services outpatient were explained and offered.  Discussed the importance of continued conversation with family and the medical providers regarding overall plan of care and treatment options,  ensuring decisions are within the context of the patient's values and GOCs.    Questions and concerns were addressed.  Hard Choices booklet left for review. The family was encouraged to call with questions or concerns.  PMT will continue to support holistically.   NEXT OF KIN is patient's daughter Jenna [name]. No HCPOA on file.    SUMMARY OF RECOMMENDATIONS   -Full code/full scope treatment -Patient's daughter understands poor prognosis and high risk for further decline; her goal is to optimize patient's health with the hope that she will live to meet her granddaughter (due on 11/30) -Patient's daughter is interested in input from cardiology and nephrology, discussed with Dr. Maylene Roes -Psychosocial and emotional support provided -Ongoing support from PMT   Code Status/Advance Care Planning: Full code  Palliative Prophylaxis:  Frequent Pain Assessment and Turn Reposition  Additional Recommendations (Limitations, Scope, Preferences): Full Scope Treatment   Prognosis:  Very poor prognosis, likely weeks given limited treatment options and ongoing decline  Discharge Planning: To Be Determined      Primary Diagnoses: Present on Admission:  Pulmonary edema with left heart failure ([name])  Lupus (systemic lupus erythematosus) ([name])  Coronary artery disease involving native coronary artery of native heart  CKD (chronic kidney disease) stage 5, GFR less than 15 ml/min ([name])  Acute on chronic systolic CHF (congestive heart failure) ([name])  Paroxysmal atrial fibrillation ([name])  Cytomegalovirus (CMV) viremia ([name])  Chest pain  Uremic encephalopathy   I have reviewed the medical record, interviewed the patient and family, and examined the patient. The following aspects are pertinent.  Past Medical History:  Diagnosis Date   Anemia    Anxiety    panic attack- talks herself and takes deep breathes   CAD (coronary artery disease)    a. STEMI 10/2018 s/p DES to LAD.   Depression    Dyspnea     with exertion    ESRD (end stage renal disease) ([place])    hemo TTHSAT   Essential hypertension    FSGS (focal segmental glomerulosclerosis) 2011   By renal biopsy   Head injury    age 36   HFrEF (heart failure with reduced ejection fraction) ([place])    History of kidney stones    kidney stone   Hx of lupus nephritis 2011   by renal biopsy   Ischemic cardiomyopathy    Kidney transplant recipient    LA thrombus 10/2018   Lupus (systemic lupus erythematosus) ([place])    followed by Dr. 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 remote parietal infarct on brain MRI 12/2019.   Pericarditis    age 43ish   Renal stone    Secondary hyperparathyroidism of renal origin ([place])    SLE (systemic lupus erythematosus) ([name])    TIA (transient ischemic attack)    no residual effects   Social History   Socioeconomic History   Marital status:  Legally Separated    Spouse name: Not on file   Number of children: 1   Years of education: 36   Highest education level: 12th grade  Occupational History   Not on file  Tobacco Use   Smoking status: Never    Passive exposure: Past   Smokeless tobacco: Never  Vaping Use   Vaping Use: Never used  Substance and Sexual Activity   Alcohol use: No   Drug use: No   Sexual activity: Not Currently  Other Topics Concern   Not on file  Social History Narrative   Right Handed   Lives in a two story home   Daughter recently got married    Social Determinants of Health   Financial Resource Strain: Low Risk    Difficulty of Paying Living Expenses: Not very hard  Food Insecurity: No Food Insecurity   Worried About Charity fundraiser in the Last Year: Never true   Ran Out of Food in the Last Year: Never true  Transportation Needs: No Transportation Needs   Lack of Transportation (Medical): No   Lack of Transportation (Non-Medical): No  Physical Activity:  Sufficiently Active   Days of Exercise per Week: 5 days   Minutes of Exercise per Session: 30 min  Stress: Stress Concern Present   Feeling of Stress : To some extent  Social Connections: Moderately Isolated   Frequency of Communication with Friends and Family: More than three times a week   Frequency of Social Gatherings with Friends and Family: More than three times a week   Attends Religious Services: 1 to 4 times per year   Active Member of [employer] or Organizations: No   Attends Music therapist: Never   Marital Status: Separated   Family History  Problem Relation Age of Onset   Diabetes Mother    Hypertension Father    Cancer Sister    Hypertension Brother    Scheduled Meds:  aspirin EC  81 mg Oral q morning   citalopram  10 mg Oral Daily   furosemide  80 mg Intravenous BID   heparin  5,000 Units Subcutaneous Q8H   hydroxychloroquine  200 mg Oral Daily   isosorbide-hydrALAZINE  1 tablet Oral TID   pantoprazole  40 mg Oral BID AC   predniSONE  5 mg Oral Q breakfast   rosuvastatin  5 mg Oral q1800   sodium bicarbonate  650 mg Oral TID with meals   tacrolimus  0.5 mg Oral Daily   [START ON 12/08/2020] valGANciclovir  450 mg Oral QODAY   Continuous Infusions: PRN Meds:.acetaminophen **OR** acetaminophen, albuterol, nitroGLYCERIN, ondansetron **OR** ondansetron (ZOFRAN) IV, polyethylene glycol Medications Prior to Admission:  Prior to Admission medications   Medication Sig Start Date End Date Taking? Authorizing Provider  acetaminophen (TYLENOL) 500 MG tablet Take 1,000 mg by mouth every 6 (six) hours as needed for headache.   Yes [provider]  ALPRAZolam (XANAX) 0.25 MG tablet Take 1 tablet (0.25 mg total) by mouth at bedtime as needed for anxiety. 11/11/20  Yes Sheikh, Omair Latif, DO  aspirin 81 MG EC tablet Take 81 mg by mouth every morning. 11/21/19  Yes [provider]  citalopram (CELEXA) 10 MG tablet Take 1 tablet (10 mg total) by mouth  daily. 10/29/20  Yes [name], Betty G, MD  hydroxychloroquine (PLAQUENIL) 200 MG tablet Take 1 tablet (200 mg total) by mouth 2 (two) times daily. 12/25/13  Yes Tresa Garter, MD  isosorbide-hydrALAZINE (BIDIL) 20-37.5  MG tablet Take 1 tablet by mouth 3 (three) times daily. 08/26/20  Yes Debbe Odea, MD  mirtazapine (REMERON) 7.5 MG tablet Take 7.5 mg by mouth at bedtime.   Yes [provider]  nitroGLYCERIN (NITROSTAT) 0.4 MG SL tablet Place 0.4 mg under the tongue every 5 (five) minutes as needed for chest pain.    Yes [provider]  ondansetron (ZOFRAN) 4 MG tablet Take 1 tablet (4 mg total) by mouth every 6 (six) hours as needed for nausea. 11/11/20  Yes Sheikh, Omair Latif, DO  oxyCODONE (OXY IR/ROXICODONE) 5 MG immediate release tablet Take 5 mg by mouth every 4 (four) hours as needed for severe pain.   Yes [provider]  pantoprazole sodium (PROTONIX) 40 mg Place 40 mg into feeding tube daily.   Yes [provider]  predniSONE (DELTASONE) 5 MG tablet Take 5 mg by mouth daily. 05/16/19  Yes [provider]  rosuvastatin (CRESTOR) 5 MG tablet Take 5 mg by mouth daily at 6 PM. 05/02/19  Yes [provider]  simethicone (MYLICON) 80 MG chewable tablet Chew 80 mg by mouth every 6 (six) hours as needed for flatulence.   Yes [provider]  sodium bicarbonate 650 MG tablet Take 1 tablet (650 mg total) by mouth 3 (three) times daily. 11/11/20  Yes Sheikh, Omair Latif, DO  sodium zirconium cyclosilicate (LOKELMA) 5 g packet Take 5 g by mouth daily.   Yes [provider]  tacrolimus (PROGRAF) 0.5 MG capsule Take 1 capsule (0.5 mg total) by mouth daily. Patient taking differently: Take 0.5 mg by mouth 2 (two) times daily. 11/12/20  Yes Sheikh, Omair Latif, DO  valGANciclovir (VALCYTE) 450 MG tablet Take 450 mg by mouth every other day.   Yes [provider]   Allergies  Allergen Reactions   Enalapril Maleate  Anaphylaxis, Swelling and Other (See Comments)    Throat swells   Penicillins Other (See Comments)    Made patient lightheaded PATIENT HAS HAD A PCN REACTION WITH IMMEDIATE RASH, FACIAL/TONGUE/THROAT SWELLING, SOB, OR LIGHTHEADEDNESS WITH HYPOTENSION:  #  #  YES  #  #  Has patient had a PCN reaction causing severe rash involving mucus membranes or skin necrosis: No Has patient had a PCN reaction that required hospitalization: No Has patient had a PCN reaction occurring within the last 10 years: No If all of the above answers are "NO", then may proceed with Cephalosporin use.    Chocolate Nausea And Vomiting   Tape Itching, Rash and Other (See Comments)   Review of Systems  Unable to perform ROS: Mental status change   Physical Exam Vitals and nursing note reviewed.  Constitutional:      General: She is sleeping.     Appearance: She is ill-appearing.  Cardiovascular:     Rate and Rhythm: Normal rate.  Pulmonary:     Effort: Pulmonary effort is normal.    Vital Signs: BP (!) 151/95   Pulse 77   Temp 98.4 F (36.9 C) (Oral)   Resp 11   SpO2 100%  Pain Scale: Faces      SpO2: SpO2: 100 % O2 Device:SpO2: 100 % O2 Flow Rate: .   IO: Intake/output summary: No intake or output data in the 24 hours ending 12/07/20 1249  LBM:   Baseline Weight:   Most recent weight:       Palliative Assessment/Data: 20%     Time In: 10:45AM Time Out: 12:00PM Time Total: 75 minutes Greater  than 50% of this time was spent in counseling and coordinating care related to the above assessment and plan.  Dorthy Cooler, PA-C Palliative Medicine Team Team phone # [phone]  Thank you for allowing the Palliative Medicine Team to assist in the care of this patient. Please utilize secure chat with additional questions, if there is no response within 30 minutes please call the above phone number.  Palliative Medicine Team providers are available by phone from 7am to 7pm daily and can be  reached through the team cell phone.  Should this patient require assistance outside of these hours, please call the patient's attending physician.

## 2020-12-07 NOTE — Assessment & Plan Note (Signed)
   Continue home regimen of hydroxychloroquine if patient is able to tolerate oral intake

## 2020-12-07 NOTE — Progress Notes (Addendum)
  PROGRESS NOTE  Called by RN that after patient arrived to unit, she was noted to be lethargic, moaning, minimally responsive to stimuli. Rapid response RN was called. Patient was given narcan with only slight response (she had received oxycodone at SNF prior to transfer to the ED ~6pm 10/22).   Patient was seen and examined.  Her vitals remain stable with heart rate 82, blood pressure 145/91, satting 98% on room air.  She does not appear to be in any distress.  She was not responsive to verbal stimuli, but did withdraw and grimace to painful stimuli.  Pupils were equal and reactive to light.  She is protecting her airway. The only other medications she received since admission is IV Lasix 80 mg at 2 AM.  There is no urine output reported from ED RN.  Another dose to be given this afternoon. Echocardiogram results reviewed.  EF is 25 to 30% with global hypokinesis, grade 3 diastolic dysfunction. In July 2022 EF showed EF 20 to 25%, severely reduced LV systolic function, apical akinesis, moderate hypokinesis other segments  Repeat lab work including CBC, BMP, ammonia level Stat ABG Stat head CT EEG Bladder scan   I called daughter to update but no answer, voicemail box is full.   Total critical care time: 33 minutes. Time 4:38pm - 5:11pm  Critical care time was exclusive of separately billable procedures and treating other patients. Critical care was necessary to treat or prevent imminent or life-threatening deterioration. Critical care was time spent personally by me on the following activities: development of treatment plan with patient and/or surrogate as well as nursing, discussions with consultants, evaluation of patient's response to treatment, examination of patient, obtaining history from patient or surrogate, ordering and performing treatments and interventions, ordering and review of laboratory studies, ordering and review of radiographic studies, pulse oximetry and re-evaluation of  patient's condition.    Dessa Phi, DO [facility] 12/07/2020, 4:58 PM  Available via Epic secure chat 7am-7pm After these hours, please refer to coverage provider listed on [email or web address]   Addendum: Spoke with daughter this evening regarding this afternoon's events. Daughter states that patient is in a lot of pain, writhing, moaning, complaining of chest pain. Daughter says that patient has been this way for about 4 days, has been answering simple questions by nodding and shaking her head only and has not been responsive at SNF. We discussed patient's HF, CKD, cardiorenal syndrome, encephalopathy and benefit/risk of pain medication. We discussed aggressive care including dialysis and comfort care/hospice. Finally decided to give low-dose fentanyl prn to give patient pain relief and monitor closely overnight. If declines further, we may have to consider transitioning to full comfort care overnight. Will sign out to night team.   Dessa Phi, DO [facility] 12/07/2020, 7:37 PM   Available via Epic secure chat 7am-7pm After these hours, please refer to coverage provider listed on [email or web address]

## 2020-12-07 NOTE — Assessment & Plan Note (Signed)
   Assessment and plan as above under pulmonary edema

## 2020-12-08 ENCOUNTER — Inpatient Hospital Stay (HOSPITAL_COMMUNITY): Payer: Medicare Other

## 2020-12-08 DIAGNOSIS — I501 Left ventricular failure: Secondary | ICD-10-CM | POA: Diagnosis not present

## 2020-12-08 DIAGNOSIS — Z515 Encounter for palliative care: Secondary | ICD-10-CM | POA: Diagnosis not present

## 2020-12-08 DIAGNOSIS — G9340 Encephalopathy, unspecified: Secondary | ICD-10-CM | POA: Diagnosis not present

## 2020-12-08 DIAGNOSIS — I132 Hypertensive heart and chronic kidney disease with heart failure and with stage 5 chronic kidney disease, or end stage renal disease: Secondary | ICD-10-CM | POA: Diagnosis not present

## 2020-12-08 DIAGNOSIS — Z7189 Other specified counseling: Secondary | ICD-10-CM | POA: Diagnosis not present

## 2020-12-08 LAB — CBC
HCT: 34.7 % — ABNORMAL LOW (ref 36.0–46.0)
Hemoglobin: 10.9 g/dL — ABNORMAL LOW (ref 12.0–15.0)
MCH: 28.8 pg (ref 26.0–34.0)
MCHC: 31.4 g/dL (ref 30.0–36.0)
MCV: 91.8 fL (ref 80.0–100.0)
Platelets: 64 10*3/uL — ABNORMAL LOW (ref 150–400)
RBC: 3.78 MIL/uL — ABNORMAL LOW (ref 3.87–5.11)
RDW: 20.4 % — ABNORMAL HIGH (ref 11.5–15.5)
WBC: 3.9 10*3/uL — ABNORMAL LOW (ref 4.0–10.5)
nRBC: 1.3 % — ABNORMAL HIGH (ref 0.0–0.2)

## 2020-12-08 LAB — BASIC METABOLIC PANEL
Anion gap: 14 (ref 5–15)
BUN: 126 mg/dL — ABNORMAL HIGH (ref 8–23)
CO2: 16 mmol/L — ABNORMAL LOW (ref 22–32)
Calcium: 9.6 mg/dL (ref 8.9–10.3)
Chloride: 109 mmol/L (ref 98–111)
Creatinine, Ser: 4.95 mg/dL — ABNORMAL HIGH (ref 0.44–1.00)
GFR, Estimated: 9 mL/min — ABNORMAL LOW (ref >60)
Glucose, Bld: 88 mg/dL (ref 70–99)
Potassium: 4.3 mmol/L (ref 3.5–5.1)
Sodium: 139 mmol/L (ref 135–145)

## 2020-12-08 LAB — AMMONIA: Ammonia: 28 umol/L (ref 9–35)

## 2020-12-08 LAB — MAGNESIUM: Magnesium: 3.1 mg/dL — ABNORMAL HIGH (ref 1.7–2.4)

## 2020-12-08 MED ORDER — HYDROMORPHONE HCL 1 MG/ML IJ SOLN
0.5000 mg | Freq: Once | INTRAMUSCULAR | Status: AC
  Administered 2020-12-08: 0.5 mg via INTRAVENOUS
  Filled 2020-12-08: qty 1

## 2020-12-08 MED ORDER — HYDROMORPHONE HCL 1 MG/ML IJ SOLN
0.5000 mg | INTRAMUSCULAR | Status: DC | PRN
  Administered 2020-12-08 – 2020-12-13 (×2): 0.5 mg via INTRAVENOUS
  Filled 2020-12-08 (×3): qty 1

## 2020-12-08 NOTE — Progress Notes (Signed)
Palliative:  HPI:  65 y.o. 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    I met today with [name] but she is sleeping, not completely comfortably, but resting none the less. No episodes of pain/chest pain while I was present. She does become restless when spoken too or stimulated in any way. No family/visitors at bedside.   I am familiar with [name] and her daughter, Jenna [name], from past hospitalization. I have had conversations with Jenna [name] and her family explaining expectations that we would get to this stage. I called and had a long conversation with Jenna [name]. Jenna [name] shares with me her her struggles with her mother's current condition. Jenna [name] knows that [name] is approaching end of life. Complicating the situation is that Jenna [name] is pregnant with her first child ([name] first grandchild) currently and nearing her due date. Jenna [name] is coming to the realization that her mother will likely not be able to meet her baby.   Jenna [name] has taken care of her mother through severe illness since she was 65 year old. Through all this time [name] has remained strong and has always fought so hard for her health and recovery with Jenna [name] by her side. All they know how to do is continue to fight for improvement and time. Jenna [name] is afraid that if she sets any limitations of care and allows comfort that she will be acting against what her mother's wishes would be even though Jenna [name] does not want her mother to suffer.   We spent time exploring how to reconcile  the best decisions for [name] at this stage of her illness and life. I encouraged Jenna [name] to continue to call on her support team and spend some time in prayer. I encouraged Jenna [name] to focus on the peace that her mother has spiritually even though she is not ready to leave her family. Jenna [name] will ask their pastor to come and spend some time with [name] to assist with helping them all to find peace in this situation. I also encouraged Jenna [name] to pray and consider what we should do regarding code status/DNR in the event that God calls [name] home and if this is a situation we really want to be in which would lead to even more difficult choices and situations. Jenna [name] agrees to thinking and praying on this situation and we will discuss further tomorrow.   All questions/concerns addressed. Emotional support provided.   Exam: Somnolent. Sleeping. Stirs to voice but only moans then returns to sleep. Breathing regular, unlabored. Abd soft. BLE edema.   Plan: - Ongoing goals of care conversation.  - Support to family who are trying to come to terms and find acceptance that [name] is approaching end of life.   [place], NP Palliative Medicine Team Pager [phone] (Please see [email or web address] for schedule) Team Phone [phone]    Greater than 50%  of this time was spent counseling and coordinating care related to the above assessment and plan

## 2020-12-08 NOTE — Progress Notes (Signed)
EEG complete - results pending 

## 2020-12-08 NOTE — Progress Notes (Signed)
Mobility Specialist Progress Note:   12/08/20 1415  Mobility  Activity Refused mobility  $Mobility charge 1 Mobility   Pt refused mobility d/t unspecified reasons. Pt lethargic and very hard to respond, told me to "go away".   Nelta Numbers Mobility Specialist  Phone [phone]

## 2020-12-08 NOTE — Progress Notes (Signed)
Jenna [name] 2I[address] Manufacturing engineer [facility]) Hospital Liaison note:  This patient is currently enrolled in [facility] outpatient-based Palliative Care. Will continue to follow for disposition.  Please call with any outpatient palliative questions or concerns.  Thank you, Lorelee Market, LPN [facility] Liaison [phone]

## 2020-12-08 NOTE — Progress Notes (Signed)
SLP Cancellation Note  Patient Details Name: Jenna [name] MRN: [id number] DOB: 09-16-1955   Cancelled treatment:       Reason Eval/Treat Not Completed: Fatigue/lethargy limiting ability to participate. Spoke with RN who reported pt with decreased alertness for PO intake this am post Rapid Response event last night (12/07/20). SLP to f/u on subsequent date.     Jenna [name], [place], [place] [facility] Office Number: [phone]  Acie Fredrickson 12/08/2020, 9:13 AM

## 2020-12-08 NOTE — Progress Notes (Signed)
Cross-covering note:   Patient seen for restlessness and agitation. She has been encephalopathic since admission on 10/22, did not have acute finding on head CT or elevation in ammonia or CO2, and condition likely related to renal failure with BUN 126.   She is keeping her eyes closed and not answering questions but is grimacing, restless, and appears to be in pain. She did not improve with low-dose fentanyl per RN.   Uncontrolled pain could be contributing to her state and plan to try a dose of Dilaudid. Avoiding antipsychotics in light of prolonged QT and concerned that sedatives will worsen her confusion. Safety sitter recommended but none available.

## 2020-12-08 NOTE — Progress Notes (Addendum)
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   Of note, patient was recently hospitalized at [facility] from 9/29-10/10 during that hospitalization patient was managed for acute on chronic congestive heart failure thought to be secondary to decreasing urine output from progressive cardiorenal syndrome.  Patient was evaluated by nephrology but was felt to not be able to tolerate reinitiating hemodialysis.  Patient did have some improvement in symptoms and after coordination with the family was discharged back to her skilled nursing facility on 10/10.   Daughter reports the patient has been exhibiting increasing lethargy ever since her discharge on 10/10.  This has been associated with decreasing oral intake.  Earlier in the evening at approximately 6 PM on 10/22, patient began to complain of chest discomfort.  Patient was administered 3 separate doses of nitroglycerin as well as a dose of oxycodone without any improvement in chest discomfort.     Daughter also reports that patient has exhibited progressively worsening shortness of breath since her recent hospitalization as well.     Upon evaluation in the emergency department initial work-up revealed a significant right lower lobe opacity on chest x-ray with stable left basilar opacity and bilateral pleural  effusions.  Due to ongoing symptoms of shortness of breath the hospitalist group was called to assess the patient for admission to the hospital.  New events last 24 hours / Subjective: Since admission, patient had episodes of unresponsiveness, severe pain throughout yesterday and overnight.  She seemed to respond much better to Dilaudid over fentanyl.  This morning, patient is arousable to verbal stimuli but does not answer questions, moans and groans in pain. Documented 935 mL urine output since admission.  Rounded on patient again this afternoon. She is resting comfortably so I did not wake her. Her friend was at bedside. I spoke with palliative care, who had an extended conversation with daughter this afternoon regarding patient's continued decline.   Assessment & Plan:   Principal Problem:   Pulmonary edema with left heart failure ([name]) Active Problems:   Lupus (systemic lupus erythematosus) ([name])   Goals of care, counseling/discussion   CKD (chronic kidney disease) stage 5, GFR less than 15 ml/min ([name])   Coronary artery disease involving native coronary artery of native heart   Paroxysmal atrial fibrillation ([name])   Acute on chronic systolic CHF (congestive heart failure) ([name])   Cytomegalovirus (CMV) viremia ([name])   Chest pain   Uremic encephalopathy   Cardiorenal syndrome   Progressing cardiorenal failure Acute on chronic systolic heart failure -Baseline EF 20 to 25% (Echo 08/19/20 at [facility])  -Repeat echo with EF 25-50%, global hypokinesis, grade III dd  -Followed by outpatient cardiology at [facility], Dr. Norman Clay / Dr. Lamonte Sakai  -Chest x-ray revealing persisting pulmonary edema with bilateral pleural effusions and infiltrates, with volume overload on physical exam -She was poorly responsive to torsemide, it was discontinued previous hospitalization due to rising creatinine -BNP >4500  -  IV Lasix -Strict I's and O's and daily weight   Chest pain, hx CAD  -Patient is not  a candidate for cardiac catheterization considering her advanced tenuous renal function -Troponin trend has been flat 184 >> 183 >> 179 -Repeat echo with EF 25-50%, global hypokinesis, grade III dd  -Continue aspirin, crestor    CKD stage 5  -Hx of SLE with s/p renal transplant April 2020  -Followed by outpatient nephrology Dr. 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 They plan to discuss further with daughter today.      DVT prophylaxis:  heparin injection 5,000 Units Start: 12/07/20 1400 Place TED hose Start: 12/07/20 1245  Code Status: Full code  Family Communication: None at bedside, will discuss further with daughter today after palliative care consult today  Disposition Plan:  Status is: Inpatient  Remains inpatient appropriate because: encephalopathy, cardiorenal syndrome     Consultants:  None   Procedures:  None  Antimicrobials:  Anti-infectives (From admission, onward)    Start     Dose/Rate Route Frequency Ordered Stop   12/08/20 1000  valGANciclovir (VALCYTE) 450 MG tablet TABS 450 mg        450 mg Oral Every other day 12/06/20 2350     12/07/20 1000   hydroxychloroquine (PLAQUENIL) tablet 200 mg       Note to Pharmacy: Recent Rx's are Plaquenil 200 mg once daily   200 mg Oral Daily 12/06/20 2350          Objective: Vitals:   12/08/20 0305 12/08/20 0400 12/08/20 0900 12/08/20 1259  BP: (!) 151/102  (!) 144/90 (!) 140/94  Pulse: 92  85 86  Resp: $Remo'20  16 12  'UhqIA$ Temp: 98.3 F (36.8 C)  (!) 96.4 F (35.8 C) (!) 96.5 F (35.8 C)  TempSrc: Axillary  Axillary Axillary  SpO2: 97%   94%  Weight:  94.8 kg    Height:        Intake/Output Summary (Last 24 hours) at 12/08/2020 1327 Last data filed at 12/08/2020 0400 Gross per 24 hour  Intake 240 ml  Output 935 ml  Net -695 ml   Filed Weights   12/07/20 1610 12/08/20 0400  Weight: 93.5 kg 94.8 kg    Examination: General exam: Appears calm, arousable to voice but then moans and groans in pain  Respiratory system: Clear to auscultation. Respiratory effort normal. Cardiovascular system: S1 & S2 heard, RRR. No pedal edema. Gastrointestinal system: Abdomen is nondistended, soft and nontender Central nervous system: Alert  Extremities: Symmetric in appearance bilaterally  Skin: No rashes, lesions or ulcers on exposed skin    Data Reviewed: I have personally reviewed following labs and imaging studies  CBC: Recent Labs  Lab 12/06/20 2014 12/07/20 0500 12/07/20 0554 12/08/20 0242  WBC 3.4* 3.4*  --  3.9*  NEUTROABS 2.9 2.8  --   --  HGB 11.4* 11.2* 12.6 10.9*  HCT 35.9* 35.0* 37.0 34.7*  MCV 91.1 91.1  --  91.8  PLT 74* 68*  --  64*    Basic Metabolic Panel: Recent Labs  Lab 12/06/20 2014 12/07/20 0500 12/07/20 0554 12/08/20 0242  NA 139 139 141 139  K 4.3 4.2 4.1 4.3  CL 105 107  --  109  CO2 18* 16*  --  16*  GLUCOSE 120* 102*  --  88  BUN 124* 127*  --  126*  CREATININE 5.01* 4.99*  --  4.95*  CALCIUM 9.7 9.7  --  9.6  MG  --  2.9*  --  3.1*    GFR: Estimated Creatinine Clearance: 13.4 mL/min (A) (by C-G formula based on SCr of 4.95 mg/dL (H)). Liver  Function Tests: Recent Labs  Lab 12/06/20 2014 12/07/20 0500  AST 28 28  ALT 19 18  ALKPHOS 138* 130*  BILITOT 1.6* 1.5*  PROT 7.0 6.6  ALBUMIN 3.1* 3.0*    Recent Labs  Lab 12/06/20 2014  LIPASE 22    Recent Labs  Lab 12/08/20 0242  AMMONIA 28   Coagulation Profile: No results for input(s): INR, PROTIME in the last 168 hours. Cardiac Enzymes: No results for input(s): CKTOTAL, CKMB, CKMBINDEX, TROPONINI in the last 168 hours. BNP (last 3 results) No results for input(s): PROBNP in the last 8760 hours. HbA1C: No results for input(s): HGBA1C in the last 72 hours. CBG: No results for input(s): GLUCAP in the last 168 hours. Lipid Profile: Recent Labs    12/07/20 0500  CHOL 117  HDL 44  LDLCALC 50  TRIG 114  CHOLHDL 2.7    Thyroid Function Tests: No results for input(s): TSH, T4TOTAL, FREET4, T3FREE, THYROIDAB in the last 72 hours. Anemia Panel: No results for input(s): VITAMINB12, FOLATE, FERRITIN, TIBC, IRON, RETICCTPCT in the last 72 hours. Sepsis Labs: Recent Labs  Lab 12/07/20 0230  PROCALCITON 0.69     Recent Results (from the past 240 hour(s))  Resp Panel by RT-PCR (Flu A&B, Covid) Nasopharyngeal Swab     Status: None   Collection Time: 12/06/20  9:48 PM   Specimen: Nasopharyngeal Swab; Nasopharyngeal(NP) swabs in vial transport medium  Result Value Ref Range Status   SARS Coronavirus 2 by RT PCR NEGATIVE NEGATIVE Final    Comment: (NOTE) SARS-CoV-2 target nucleic acids are NOT DETECTED.  The SARS-CoV-2 RNA is generally detectable in upper respiratory specimens during the acute phase of infection. The lowest concentration of SARS-CoV-2 viral copies this assay can detect is 138 copies/mL. A negative result does not preclude SARS-Cov-2 infection and should not be used as the sole basis for treatment or other patient management decisions. A negative result may occur with  improper specimen collection/handling, submission of specimen other than  nasopharyngeal swab, presence of viral mutation(s) within the areas targeted by this assay, and inadequate number of viral copies(<138 copies/mL). A negative result must be combined with clinical observations, patient history, and epidemiological information. The expected result is Negative.  Fact Sheet for Patients:  EntrepreneurPulse.com.au  Fact Sheet for Healthcare Providers:  IncredibleEmployment.be  This test is no t yet approved or cleared by the [place] FDA and  has been authorized for detection and/or diagnosis of SARS-CoV-2 by FDA under an Emergency Use Authorization (EUA). This EUA will remain  in effect (meaning this test can be used) for the duration of the COVID-19 declaration under Section 564(b)(1) of the Act, 21 U.S.C.section 360bbb-3(b)(1), unless the authorization is  terminated  or revoked sooner.       Influenza A by PCR NEGATIVE NEGATIVE Final   Influenza B by PCR NEGATIVE NEGATIVE Final    Comment: (NOTE) The Xpert Xpress SARS-CoV-2/FLU/RSV plus assay is intended as an aid in the diagnosis of influenza from Nasopharyngeal swab specimens and should not be used as a sole basis for treatment. Nasal washings and aspirates are unacceptable for Xpert Xpress SARS-CoV-2/FLU/RSV testing.  Fact Sheet for Patients: EntrepreneurPulse.com.au  Fact Sheet for Healthcare Providers: IncredibleEmployment.be  This test is not yet approved or cleared by the [place] FDA and has been authorized for detection and/or diagnosis of SARS-CoV-2 by FDA under an Emergency Use Authorization (EUA). This EUA will remain in effect (meaning this test can be used) for the duration of the COVID-19 declaration under Section 564(b)(1) of the Act, 21 U.S.C. section 360bbb-3(b)(1), unless the authorization is terminated or revoked.  Performed at [facility], [place] [address] [place], [place] [address]    Culture, blood (routine x 2)     Status: None (Preliminary result)   Collection Time: 12/07/20  2:16 AM   Specimen: BLOOD RIGHT ARM  Result Value Ref Range Status   Specimen Description BLOOD RIGHT ARM  Final   Special Requests   Final    BOTTLES DRAWN AEROBIC AND ANAEROBIC Blood Culture results may not be optimal due to an inadequate volume of blood received in culture bottles   Culture   Final    NO GROWTH 1 DAY Performed at [facility], [place] [address] [place], [place] [address]    Report Status PENDING  Incomplete  Culture, blood (routine x 2)     Status: None (Preliminary result)   Collection Time: 12/07/20  2:08 PM   Specimen: BLOOD  Result Value Ref Range Status   Specimen Description BLOOD SITE NOT SPECIFIED  Final   Special Requests   Final    BOTTLES DRAWN AEROBIC ONLY Blood Culture results may not be optimal due to an excessive volume of blood received in culture bottles   Culture   Final    NO GROWTH < 24 HOURS Performed at [facility], [place] [address], [place], [place] [address]    Report Status PENDING  Incomplete       Radiology Studies: CT HEAD WO CONTRAST (5MM)  Result Date: 12/08/2020 CLINICAL DATA:  Mental status change, unknown cause EXAM: CT HEAD WITHOUT CONTRAST TECHNIQUE: Contiguous axial images were obtained from the base of the skull through the vertex without intravenous contrast. COMPARISON:  11/13/2020 FINDINGS: Brain: No evidence of acute infarction, hemorrhage, cerebral edema, mass, mass effect, or midline shift. Ventricles and sulci are within normal limits for age. No extra-axial fluid collection. 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 Vascular: No hyperdense vessel. Atherosclerotic calcifications in the intracranial carotid and vertebral arteries. Skull: Normal. Negative for fracture or focal lesion. Sinuses/Orbits: No acute finding. Other: The mastoid air cells are well aerated. IMPRESSION: No acute  intracranial process. Electronically Signed   By: Merilyn Baba M.D.   On: 12/08/2020 02:16   DG Chest Port 1 View  Result Date: 12/06/2020 CLINICAL DATA:  Chest pain. EXAM: PORTABLE CHEST 1 VIEW COMPARISON:  November 17, 2020 FINDINGS: Moderate severity dense airspace opacity is again seen within the right lower lobe, decreased in severity when compared to the prior study. Predominant stable left basilar opacity is seen. Very small, stable bilateral pleural effusions are seen. No pneumothorax is identified. The cardiac  silhouette is markedly enlarged and unchanged in size. The visualized skeletal structures are unremarkable. IMPRESSION: 1. Moderate severity dense right lower lobe airspace opacity, decreased in severity when compared to the prior study. 2. Stable left basilar opacity, consistent with atelectasis and/or infiltrate. 3. Very small, stable bilateral pleural effusions. Electronically Signed   By: Virgina Norfolk M.D.   On: 12/06/2020 20:35   EEG adult  Result Date: 12/08/2020 Lora Havens, MD     12/08/2020 12:51 PM Patient Name: Jenna [name] MRN: [id number] Epilepsy Attending: Lora Havens Referring Physician/Provider: Dr Dessa Phi Date: 12/08/2020 Duration: 23.18 mins Patient history: Level of alertness: Awake AEDs during EEG study: Technical aspects: This EEG study was done with scalp electrodes positioned according to the 10-20 International system of electrode placement. Electrical activity was acquired at a sampling rate of $Remov'500Hz'ZJuchW$  and reviewed with a high frequency filter of $RemoveB'70Hz'aPcbDMFJ$  and a low frequency filter of $RemoveB'1Hz'RmoeyQXO$ . EEG data were recorded continuously and digitally stored. Description: EEG showed continued generalized lumbar every 3-5 hours theta-delta slowing.  Hyperventilation and photic stimulation were not performed.   ABNORMALITY - Continuous slow, generalized IMPRESSION: This study is suggestive of moderate diffuse encephalopathy, nonspecific etiology. No seizures or  epileptiform discharges were seen throughout the recording. Lora Havens   ECHOCARDIOGRAM COMPLETE  Result Date: 12/07/2020    ECHOCARDIOGRAM REPORT   Patient Name:   Jenna [name] Date of Exam: 12/07/2020 Medical Rec #:  [id number]        Height:       67.0 in Accession #:    [id number]       Weight:       207.7 lb Date of Birth:  [birth year]        BSA:          2.055 m Patient Age:    65 years         BP:           154/93 mmHg Patient Gender: F                HR:           80 bpm. Exam Location:  Inpatient Procedure: 2D Echo, Cardiac Doppler, Color Doppler, Intracardiac Opacification            Agent and 3D Echo REPORT CONTAINS CRITICAL RESULT Indications:    R07.9* Chest pain, unspecified  History:        Patient has prior history of Echocardiogram examinations, most                 recent 05/06/2013. CAD, TIA, Signs/Symptoms:Chest Pain; Risk                 Factors:Hypertension and Dyslipidemia. Lupus.  Sonographer:    Roseanna Rainbow RDCS Referring Phys: [id number] [facility]  Sonographer Comments: Technically difficult study due to poor echo windows. IMPRESSIONS  1. Global hypokinesis, the apex and anteroseptum appear akinetic. Marland Kitchen Left ventricular ejection fraction, by estimation, is 25 to 30%. The left ventricle has severely decreased function. The left ventricle demonstrates global hypokinesis. There is mild left ventricular hypertrophy. Left ventricular diastolic parameters are consistent with Grade III diastolic dysfunction (restrictive). Elevated left atrial pressure.  2. Right ventricular systolic function is low normal. The right ventricular size is moderately enlarged. There is moderately elevated pulmonary artery systolic pressure.  3. Left atrial size was moderately dilated.  4. Right atrial size was moderately dilated.  5. The mitral valve is abnormal. Moderate  mitral valve regurgitation. No evidence of mitral stenosis.  6. The tricuspid valve is abnormal. Tricuspid valve regurgitation is severe.   7. The aortic valve is tricuspid. There is mild calcification of the aortic valve. There is mild thickening of the aortic valve. Aortic valve regurgitation is not visualized. No aortic stenosis is present.  8. Pulmonic valve regurgitation is moderate.  9. The inferior vena cava is dilated in size with <50% respiratory variability, suggesting right atrial pressure of 15 mmHg. FINDINGS  Left Ventricle: Global hypokinesis, the apex and anteroseptum appear akinetic. Left ventricular ejection fraction, by estimation, is 25 to 30%. The left ventricle has severely decreased function. The left ventricle demonstrates global hypokinesis. Definity contrast agent was given IV to delineate the left ventricular endocardial borders. The left ventricular internal cavity size was normal in size. There is mild left ventricular hypertrophy. Left ventricular diastolic parameters are consistent with Grade III diastolic dysfunction (restrictive). Elevated left atrial pressure. Right Ventricle: Ventricular septum is flattened in diastole consistent with RV volume overload. The right ventricular size is moderately enlarged. Right vetricular wall thickness was not assessed. Right ventricular systolic function is low normal. There  is moderately elevated pulmonary artery systolic pressure. The tricuspid regurgitant velocity is 3.22 m/s, and with an assumed right atrial pressure of 15 mmHg, the estimated right ventricular systolic pressure is 32.9 mmHg. Left Atrium: Left atrial size was moderately dilated. Right Atrium: Right atrial size was moderately dilated. Pericardium: There is no evidence of pericardial effusion. Mitral Valve: The MV/AV VTI ration is 1.1 suggestion moderate MR. The mitral valve is abnormal. There is mild thickening of the mitral valve leaflet(s). There is mild calcification of the mitral valve leaflet(s). Mild mitral annular calcification. Moderate mitral valve regurgitation. No evidence of mitral valve stenosis. MV  peak gradient, 8.0 mmHg. The mean mitral valve gradient is 2.0 mmHg. Tricuspid Valve: The tricuspid valve is abnormal. Tricuspid valve regurgitation is severe. No evidence of tricuspid stenosis. Aortic Valve: The aortic valve is tricuspid. There is mild calcification of the aortic valve. There is mild thickening of the aortic valve. There is mild aortic valve annular calcification. Aortic valve regurgitation is not visualized. No aortic stenosis  is present. Aortic valve mean gradient measures 5.0 mmHg. Aortic valve peak gradient measures 11.9 mmHg. Aortic valve area, by VTI measures 2.04 cm. Pulmonic Valve: The pulmonic valve was not well visualized. Pulmonic valve regurgitation is moderate. No evidence of pulmonic stenosis. Aorta: The aortic root is normal in size and structure. Venous: The inferior vena cava is dilated in size with less than 50% respiratory variability, suggesting right atrial pressure of 15 mmHg. IAS/Shunts: No atrial level shunt detected by color flow Doppler.  LEFT VENTRICLE PLAX 2D LVIDd:         4.60 cm      Diastology LVIDs:         3.90 cm      LV e' medial:    4.68 cm/s LV PW:         1.20 cm      LV E/e' medial:  29.1 LV IVS:        1.20 cm      LV e' lateral:   10.30 cm/s LVOT diam:     1.90 cm      LV E/e' lateral: 13.2 LV SV:         62 LV SV Index:   30 LVOT Area:     2.84 cm  3D Volume EF: LV Volumes (MOD)            3D EF:        26 % LV vol d, MOD A2C: 205.0 ml LV EDV:       194 ml LV vol d, MOD A4C: 190.0 ml LV ESV:       143 ml LV vol s, MOD A2C: 163.0 ml LV SV:        51 ml LV vol s, MOD A4C: 144.0 ml LV SV MOD A2C:     42.0 ml LV SV MOD A4C:     190.0 ml LV SV MOD BP:      47.8 ml RIGHT VENTRICLE            IVC RV S prime:     9.36 cm/s  IVC diam: 3.00 cm LEFT ATRIUM             Index        RIGHT ATRIUM           Index LA diam:        4.80 cm 2.34 cm/m   RA Area:     26.00 cm LA Vol (A2C):   53.4 ml 25.99 ml/m  RA Volume:   91.60 ml  44.58  ml/m LA Vol (A4C):   59.2 ml 28.81 ml/m LA Biplane Vol: 56.5 ml 27.50 ml/m  AORTIC VALVE                     PULMONIC VALVE AV Area (Vmax):    2.09 cm      PR End Diast Vel: 2.25 msec AV Area (Vmean):   2.26 cm AV Area (VTI):     2.04 cm AV Vmax:           172.45 cm/s AV Vmean:          100.111 cm/s AV VTI:            0.303 m AV Peak Grad:      11.9 mmHg AV Mean Grad:      5.0 mmHg LVOT Vmax:         127.00 cm/s LVOT Vmean:        79.700 cm/s LVOT VTI:          0.218 m LVOT/AV VTI ratio: 0.72  AORTA Ao Root diam: 3.20 cm Ao Asc diam:  3.30 cm MITRAL VALVE                  TRICUSPID VALVE MV Area (PHT): 5.84 cm       TR Peak grad:   41.5 mmHg MV Area VTI:   2.05 cm       TR Vmax:        322.00 cm/s MV Peak grad:  8.0 mmHg MV Mean grad:  2.0 mmHg       SHUNTS MV Vmax:       1.41 m/s       Systemic VTI:  0.22 m MV Vmean:      68.9 cm/s      Systemic Diam: 1.90 cm MV Decel Time: 130 msec MR Peak grad:    83.9 mmHg MR Mean grad:    62.0 mmHg MR Vmax:         458.00 cm/s MR Vmean:        373.0 cm/s MR PISA:         1.57 cm MR PISA Eff ROA: 13 mm MR PISA  Radius:  0.50 cm MV E velocity: 136.00 cm/s MV A velocity: 119.00 cm/s MV E/A ratio:  1.14 Carlyle Dolly MD Electronically signed by Carlyle Dolly MD Signature Date/Time: 12/07/2020/4:44:12 PM    Final       Scheduled Meds:  aspirin EC  81 mg Oral q morning   citalopram  10 mg Oral Daily   furosemide  80 mg Intravenous BID   heparin  5,000 Units Subcutaneous Q8H   hydroxychloroquine  200 mg Oral Daily   isosorbide-hydrALAZINE  1 tablet Oral TID   pantoprazole (PROTONIX) IV  40 mg Intravenous Q12H   predniSONE  5 mg Oral Q breakfast   rosuvastatin  5 mg Oral q1800   sodium bicarbonate  650 mg Oral TID with meals   tacrolimus  0.5 mg Oral Daily   valGANciclovir  450 mg Oral QODAY   Continuous Infusions:   LOS: 1 day      Time spent: 35 minutes   Dessa Phi, DO [facility] 12/08/2020, 1:27 PM   Available via Epic secure  chat 7am-7pm After these hours, please refer to coverage provider listed on [email or web address]

## 2020-12-08 NOTE — Procedures (Signed)
Patient Name: AURORE REDINGER  MRN: [id number]  Epilepsy Attending: Lora Havens  Referring Physician/Provider: Dr Dessa Phi Date: 12/08/2020 Duration: 23.18 mins  Patient history:   Level of alertness: Awake  AEDs during EEG study:   Technical aspects: This EEG study was done with scalp electrodes positioned according to the 10-20 International system of electrode placement. Electrical activity was acquired at a sampling rate of $Remov'500Hz'NKLtRc$  and reviewed with a high frequency filter of $RemoveB'70Hz'QIzeBQlF$  and a low frequency filter of $RemoveB'1Hz'hzwaxXip$ . EEG data were recorded continuously and digitally stored.   Description: EEG showed continued generalized lumbar every 3-5 hours theta-delta slowing.  Hyperventilation and photic stimulation were not performed.     ABNORMALITY - Continuous slow, generalized  IMPRESSION: This study is suggestive of moderate diffuse encephalopathy, nonspecific etiology. No seizures or epileptiform discharges were seen throughout the recording.  Elius Etheredge Barbra Sarks

## 2020-12-08 NOTE — Plan of Care (Signed)
   Problem: Clinical Measurements: Goal: Diagnostic test results will improve Outcome: Progressing   Problem: Clinical Measurements: Goal: Respiratory complications will improve Outcome: Progressing   Problem: Clinical Measurements: Goal: Cardiovascular complication will be avoided Outcome: Progressing   Problem: Activity: Goal: Risk for activity intolerance will decrease Outcome: Progressing   Problem: Nutrition: Goal: Adequate nutrition will be maintained Outcome: Progressing   Problem: Coping: Goal: Level of anxiety will decrease Outcome: Progressing   Problem: Elimination: Goal: Will not experience complications related to bowel motility Outcome: Progressing   Problem: Pain Managment: Goal: General experience of comfort will improve Outcome: Progressing   Problem: Safety: Goal: Ability to remain free from injury will improve Outcome: Progressing   Problem: Skin Integrity: Goal: Risk for impaired skin integrity will decrease Outcome: Progressing   Problem: Clinical Measurements: Goal: Ability to maintain clinical measurements within normal limits will improve Outcome: Progressing

## 2020-12-09 DIAGNOSIS — I132 Hypertensive heart and chronic kidney disease with heart failure and with stage 5 chronic kidney disease, or end stage renal disease: Secondary | ICD-10-CM | POA: Diagnosis not present

## 2020-12-09 DIAGNOSIS — Z515 Encounter for palliative care: Secondary | ICD-10-CM | POA: Diagnosis not present

## 2020-12-09 DIAGNOSIS — Z7189 Other specified counseling: Secondary | ICD-10-CM | POA: Diagnosis not present

## 2020-12-09 DIAGNOSIS — I501 Left ventricular failure: Secondary | ICD-10-CM | POA: Diagnosis not present

## 2020-12-09 LAB — BASIC METABOLIC PANEL
Anion gap: 17 — ABNORMAL HIGH (ref 5–15)
BUN: 136 mg/dL — ABNORMAL HIGH (ref 8–23)
CO2: 14 mmol/L — ABNORMAL LOW (ref 22–32)
Calcium: 9.8 mg/dL (ref 8.9–10.3)
Chloride: 112 mmol/L — ABNORMAL HIGH (ref 98–111)
Creatinine, Ser: 5.16 mg/dL — ABNORMAL HIGH (ref 0.44–1.00)
GFR, Estimated: 9 mL/min — ABNORMAL LOW (ref >60)
Glucose, Bld: 72 mg/dL (ref 70–99)
Potassium: 5.3 mmol/L — ABNORMAL HIGH (ref 3.5–5.1)
Sodium: 143 mmol/L (ref 135–145)

## 2020-12-09 LAB — CBC
HCT: 37 % (ref 36.0–46.0)
Hemoglobin: 11.5 g/dL — ABNORMAL LOW (ref 12.0–15.0)
MCH: 29.2 pg (ref 26.0–34.0)
MCHC: 31.1 g/dL (ref 30.0–36.0)
MCV: 93.9 fL (ref 80.0–100.0)
Platelets: 66 10*3/uL — ABNORMAL LOW (ref 150–400)
RBC: 3.94 MIL/uL (ref 3.87–5.11)
RDW: 20.8 % — ABNORMAL HIGH (ref 11.5–15.5)
WBC: 4.3 10*3/uL (ref 4.0–10.5)
nRBC: 1.6 % — ABNORMAL HIGH (ref 0.0–0.2)

## 2020-12-09 MED ORDER — SODIUM ZIRCONIUM CYCLOSILICATE 10 G PO PACK
10.0000 g | PACK | Freq: Every day | ORAL | Status: DC
  Administered 2020-12-09 – 2020-12-10 (×2): 10 g via ORAL
  Filled 2020-12-09 (×3): qty 1

## 2020-12-09 NOTE — Progress Notes (Signed)
PROGRESS NOTE    CENA BRUHN  SWO:[id number] DOB: January 12, 1956 DOA: 12/06/2020 PCP: [place], Betty G, MD   Chief Complaint  Patient presents with   Chest Pain   Brief Narrative/Hospital Course:  [name], 65 y.o. 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  Recently hospitalized 9/29-10/10- acute on chronic congestive heart failure thought to be secondary to decreasing urine output from progressive cardiorenal syndrome.  Patient was evaluated by nephrology but was felt to not be able to tolerate reinitiating hemodialysis.  Patient did have some improvement in symptoms and after coordination with the family was discharged back to her skilled nursing facility on 10/10  Daughter reports the patient has been exhibiting increasing lethargy ever since her discharge on 10/10.  This has been associated with decreasing oral intake.  Earlier in the evening at approximately 6 PM on 10/22, patient began to complain of chest discomfort.  Patient was administered 3 separate doses of nitroglycerin as well as a dose of oxycodone without any improvement in chest discomfort.     Daughter also reports that patient has exhibited progressively worsening shortness of breath since her recent hospitalization as well.     Upon evaluation in the emergency department initial work-up revealed a significant right lower lobe opacity on chest x-ray with stable left basilar opacity and bilateral pleural effusions.  Due to ongoing symptoms of shortness of breath the hospitalist group  was called to assess the patient for admission to the hospital.  Since admission she has had episode of unresponsiveness, severe pain throughout, needing IV Dilaudid. Given her overall decline palliative care has been involved  Subjective:  Seen examined this am. Renato Gails at bedside Seen and examined this morning.  She is alert awake, oriented to self, place, president but not to day/month but to year current c/o some stomach pain.   Assessment & Plan:  Progressive cardiorenal failure Acute on chronic systolic heart failure: Repeat echo EF 25-30% lvef, grade 3 DD moderately elevated pulmonary artery systolic pressure. Previous EF 20-25% in July. She is followed by cardiology at [facility] / Dr. Delton Coombes. Chest x-ray this admission with persistent pulmonary edema, bilateral pleural effusion and infiltrates with fluid overload.  Poorly responsive to torsemide and discontinued on last hospital admission BNP more than 4500.  Continue IV Lasix supportive care monitor intake output Daily weight.  Chest pain CAD/ischemic cardiomyopathy: Troponin has been flat 184 >> 183 >> 179, due to CKD.  Not a candidate for cardiac cath considering her advanced tenuous renal function, repeat echo EF 25-30% global hypokinesia.  No chest pain today.  She is on aspirin and Crestor.  CKD stage V with uremic encephalopathy: More alert awake today.  History of SLE S/P renal transplant in April 2020 followed by Dr. Glenna Fellows Baseline creatinine around 5.  She is on Prograf/prednisone.  Will discuss with patient's nephrology.  Patient has made her wishes known that she does not wish to resume dialysis and she understands the consequence of the decision. Recent Labs  Lab 12/06/20 2014 12/07/20 0500 12/08/20 0242 12/09/20 0308  BUN 124* 127* 126* 136*  CREATININE 5.01* 4.99* 4.95* 5.16*  Anemia of chronic renal disease.  Monitor hemoglobin  Metabolic acidosis: Due to CKD bicarb at 14 anion gap 17:  Continue oral bicarb Hyperkalemia: 2.2 ckd-At 5.3 added Lokelma  Acute encephalopathy uremic encephalopathy: Suffering from progressive uremic encephalopathy due to advanced renal disease, CT head negative on admission EEG without seizure activity.  Ammonia normal.  Continue supportive care delirium precaution avoid psychotropic medication  PAF not on anticoagulation.  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  Patient daughter tells me that her daughter wants her to be full code and that is what she wants.  Daughter/family has been discussed by palliative care and that she is in approaching end-of-life.  Continue to engage in palliative care discussion.  Class I Obesity:Patient's Body mass index is 32.6 kg/m. : Will benefit with PCP follow-up, weight loss  healthy lifestyle and outpatient sleep evaluation.  DVT prophylaxis: heparin injection 5,000 Units Start: 12/07/20 1400 Place TED hose Start: 12/07/20 1245 Code Status:   Code Status: Full Code Family Communication: plan of care discussed with patient at bedside. Status is: Inpatient  Remains inpatient appropriate because: For ongoing management of complex medical comorbidities, ongoing palliative care discussion   Objective: Vitals last 24 hrs: Vitals:   12/08/20 2014 12/09/20 0030 12/09/20 0434 12/09/20 0813  BP: 132/83 (!) 148/94 (!) 152/79 (!) 156/88  Pulse: 79 79 81 82  Resp: $Remo'19 19 18 16  'NKhGv$ Temp: (!) 97.4 F (36.3 C) 98.1 F (36.7 C) (!) 97.4 F (36.3 C) 98.3 F (36.8 C)  TempSrc: Oral Oral Oral Oral  SpO2: 95% 99% 96% 99%  Weight:   94.4 kg   Height:       Weight change: 0.9 kg No intake or output data in the 24 hours ending 12/09/20 0950 Net IO Since Admission: -695 mL [12/09/20 0950]    Physical Examination: General exam: Aa0x2-3 obese, weak,older than stated age. HEENT:Oral mucosa moist, Ear/Nose WNL grossly,dentition normal. Respiratory system: B/l diminished BS, no use of accessory muscle, non tender. Cardiovascular system: S1 & S2 +,No JVD. Gastrointestinal system: Abdomen soft, NT,ND, BS+. Nervous System:Alert, awake, moving extremities. Extremities: edema LE non pitting, distal peripheral pulses palpable.  Skin: No rashes, no icterus. MSK: Normal muscle bulk, tone, power.  Medications reviewed:  Scheduled Meds:  aspirin EC  81 mg Oral q morning   citalopram  10 mg Oral Daily   furosemide  80 mg Intravenous BID   heparin  5,000 Units Subcutaneous Q8H   hydroxychloroquine  200 mg Oral Daily   isosorbide-hydrALAZINE  1 tablet Oral TID   pantoprazole (PROTONIX) IV  40 mg Intravenous Q12H   predniSONE  5 mg Oral Q breakfast   rosuvastatin  5 mg Oral q1800   sodium bicarbonate  650 mg Oral TID with meals   sodium zirconium cyclosilicate  10 g Oral Daily   tacrolimus  0.5 mg Oral Daily   valGANciclovir  450 mg Oral QODAY   Continuous Infusions:  Diet Order             Diet renal with fluid restriction Fluid restriction: 1200 mL Fluid; Room service appropriate? Yes; Fluid consistency: Thin  Diet effective now                          Weight change: 0.9 kg  Wt  Readings from Last 3 Encounters:  12/09/20 94.4 kg  11/24/20 94.2 kg  11/11/20 92.9 kg     Consultants:see note  Procedures:see note Antimicrobials: Anti-infectives (From admission, onward)    Start     Dose/Rate Route Frequency Ordered Stop   12/08/20 1000  valGANciclovir (VALCYTE) 450 MG tablet TABS 450 mg        450 mg Oral Every other day 12/06/20 2350     12/07/20 1000  hydroxychloroquine (PLAQUENIL) tablet 200 mg       Note to Pharmacy: Recent Rx's are Plaquenil 200 mg once daily   200 mg Oral Daily 12/06/20 2350        Culture/Microbiology    Component Value Date/Time    SDES BLOOD SITE NOT SPECIFIED 12/07/2020 1408   SPECREQUEST  12/07/2020 1408    BOTTLES DRAWN AEROBIC ONLY Blood Culture results may not be optimal due to an excessive volume of blood received in culture bottles   CULT  12/07/2020 1408    NO GROWTH 2 DAYS Performed at [facility], [place] [address], [place], [place] [address]    REPTSTATUS PENDING 12/07/2020 1408    Other culture-see note  Unresulted Labs (From admission, onward)     Start     Ordered   12/08/20 0956  Tacrolimus level  Once,   R       Question:  Specimen collection method  Answer:  Lab=Lab collect   12/08/20 0955   12/08/20 0500  CBC  Daily,   R      12/07/20 1242   12/08/20 1275  Basic metabolic panel  Daily,   R      12/07/20 1242           Data Reviewed: I have personally reviewed following labs and imaging studies CBC: Recent Labs  Lab 12/06/20 2014 12/07/20 0500 12/07/20 0554 12/08/20 0242 12/09/20 0557  WBC 3.4* 3.4*  --  3.9* 4.3  NEUTROABS 2.9 2.8  --   --   --   HGB 11.4* 11.2* 12.6 10.9* 11.5*  HCT 35.9* 35.0* 37.0 34.7* 37.0  MCV 91.1 91.1  --  91.8 93.9  PLT 74* 68*  --  64* 66*   Basic Metabolic Panel: Recent Labs  Lab 12/06/20 2014 12/07/20 0500 12/07/20 0554 12/08/20 0242 12/09/20 0308  NA 139 139 141 139 143  K 4.3 4.2 4.1 4.3 5.3*  CL 105 107  --  109 112*  CO2 18* 16*  --  16* 14*  GLUCOSE 120* 102*  --  88 72  BUN 124* 127*  --  126* 136*  CREATININE 5.01* 4.99*  --  4.95* 5.16*  CALCIUM 9.7 9.7  --  9.6 9.8  MG  --  2.9*  --  3.1*  --    GFR: Estimated Creatinine Clearance: 12.8 mL/min (A) (by C-G formula based on SCr of 5.16 mg/dL (H)). Liver Function Tests: Recent Labs  Lab 12/06/20 2014 12/07/20 0500  AST 28 28  ALT 19 18  ALKPHOS 138* 130*  BILITOT 1.6* 1.5*  PROT 7.0 6.6  ALBUMIN 3.1* 3.0*   Recent Labs  Lab 12/06/20 2014  LIPASE 22   Recent Labs  Lab 12/08/20 0242  AMMONIA 28   Coagulation Profile: No results for input(s): INR,  PROTIME in the last 168 hours. Cardiac Enzymes: No results for input(s): CKTOTAL, CKMB, CKMBINDEX, TROPONINI in the last 168 hours. BNP (last 3 results) No results for input(s): PROBNP in the last 8760 hours.  HbA1C: No results for input(s): HGBA1C in the last 72 hours. CBG: No results for input(s): GLUCAP in the last 168 hours. Lipid Profile: Recent Labs    12/07/20 0500  CHOL 117  HDL 44  LDLCALC 50  TRIG 114  CHOLHDL 2.7   Thyroid Function Tests: No results for input(s): TSH, T4TOTAL, FREET4, T3FREE, THYROIDAB in the last 72 hours. Anemia Panel: No results for input(s): VITAMINB12, FOLATE, FERRITIN, TIBC, IRON, RETICCTPCT in the last 72 hours. Sepsis Labs: Recent Labs  Lab 12/07/20 0230  PROCALCITON 0.69    Recent Results (from the past 240 hour(s))  Resp Panel by RT-PCR (Flu A&B, Covid) Nasopharyngeal Swab     Status: None   Collection Time: 12/06/20  9:48 PM   Specimen: Nasopharyngeal Swab; Nasopharyngeal(NP) swabs in vial transport medium  Result Value Ref Range Status   SARS Coronavirus 2 by RT PCR NEGATIVE NEGATIVE Final    Comment: (NOTE) SARS-CoV-2 target nucleic acids are NOT DETECTED.  The SARS-CoV-2 RNA is generally detectable in upper respiratory specimens during the acute phase of infection. The lowest concentration of SARS-CoV-2 viral copies this assay can detect is 138 copies/mL. A negative result does not preclude SARS-Cov-2 infection and should not be used as the sole basis for treatment or other patient management decisions. A negative result may occur with  improper specimen collection/handling, submission of specimen other than nasopharyngeal swab, presence of viral mutation(s) within the areas targeted by this assay, and inadequate number of viral copies(<138 copies/mL). A negative result must be combined with clinical observations, patient history, and epidemiological information. The expected result is Negative.  Fact Sheet for Patients:   EntrepreneurPulse.com.au  Fact Sheet for Healthcare Providers:  IncredibleEmployment.be  This test is no t yet approved or cleared by the [place] FDA and  has been authorized for detection and/or diagnosis of SARS-CoV-2 by FDA under an Emergency Use Authorization (EUA). This EUA will remain  in effect (meaning this test can be used) for the duration of the COVID-19 declaration under Section 564(b)(1) of the Act, 21 U.S.C.section 360bbb-3(b)(1), unless the authorization is terminated  or revoked sooner.       Influenza A by PCR NEGATIVE NEGATIVE Final   Influenza B by PCR NEGATIVE NEGATIVE Final    Comment: (NOTE) The Xpert Xpress SARS-CoV-2/FLU/RSV plus assay is intended as an aid in the diagnosis of influenza from Nasopharyngeal swab specimens and should not be used as a sole basis for treatment. Nasal washings and aspirates are unacceptable for Xpert Xpress SARS-CoV-2/FLU/RSV testing.  Fact Sheet for Patients: EntrepreneurPulse.com.au  Fact Sheet for Healthcare Providers: IncredibleEmployment.be  This test is not yet approved or cleared by the [place] FDA and has been authorized for detection and/or diagnosis of SARS-CoV-2 by FDA under an Emergency Use Authorization (EUA). This EUA will remain in effect (meaning this test can be used) for the duration of the COVID-19 declaration under Section 564(b)(1) of the Act, 21 U.S.C. section 360bbb-3(b)(1), unless the authorization is terminated or revoked.  Performed at [facility], [place] [address], [place], [place] [address]   Culture, blood (routine x 2)     Status: None (Preliminary result)   Collection Time: 12/07/20  2:16 AM   Specimen: BLOOD RIGHT ARM  Result Value Ref Range Status   Specimen Description BLOOD RIGHT ARM  Final   Special Requests   Final    BOTTLES DRAWN AEROBIC AND ANAEROBIC Blood Culture results may not be optimal  due to an inadequate volume  of blood received in culture bottles   Culture   Final    NO GROWTH 2 DAYS Performed at [facility], [place] [address], [place], [place] [address]    Report Status PENDING  Incomplete  Culture, blood (routine x 2)     Status: None (Preliminary result)   Collection Time: 12/07/20  2:08 PM   Specimen: BLOOD  Result Value Ref Range Status   Specimen Description BLOOD SITE NOT SPECIFIED  Final   Special Requests   Final    BOTTLES DRAWN AEROBIC ONLY Blood Culture results may not be optimal due to an excessive volume of blood received in culture bottles   Culture   Final    NO GROWTH 2 DAYS Performed at [facility], [place] [address], [place], [place] [address]    Report Status PENDING  Incomplete     Radiology Studies: CT HEAD WO CONTRAST (5MM)  Result Date: 12/08/2020 CLINICAL DATA:  Mental status change, unknown cause EXAM: CT HEAD WITHOUT CONTRAST TECHNIQUE: Contiguous axial images were obtained from the base of the skull through the vertex without intravenous contrast. COMPARISON:  11/13/2020 FINDINGS: Brain: No evidence of acute infarction, hemorrhage, cerebral edema, mass, mass effect, or midline shift. Ventricles and sulci are within normal limits for age. No extra-axial fluid collection. 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 Vascular: No hyperdense vessel. Atherosclerotic calcifications in the intracranial carotid and vertebral arteries. Skull: Normal. Negative for fracture or focal lesion. Sinuses/Orbits: No acute finding. Other: The mastoid air cells are well aerated. IMPRESSION: No acute intracranial process. Electronically Signed   By: Merilyn Baba M.D.   On: 12/08/2020 02:16   EEG adult  Result Date: 12/08/2020 Lora Havens, MD     12/08/2020 12:51 PM Patient Name: ZYA FINKLE MRN: [id number] Epilepsy Attending: Lora Havens Referring Physician/Provider: Dr Dessa Phi Date: 12/08/2020 Duration:  23.18 mins Patient history: Level of alertness: Awake AEDs during EEG study: Technical aspects: This EEG study was done with scalp electrodes positioned according to the 10-20 International system of electrode placement. Electrical activity was acquired at a sampling rate of $Remov'500Hz'oZskcF$  and reviewed with a high frequency filter of $RemoveB'70Hz'IxqWJHnD$  and a low frequency filter of $RemoveB'1Hz'JFerhatW$ . EEG data were recorded continuously and digitally stored. Description: EEG showed continued generalized lumbar every 3-5 hours theta-delta slowing.  Hyperventilation and photic stimulation were not performed.   ABNORMALITY - Continuous slow, generalized IMPRESSION: This study is suggestive of moderate diffuse encephalopathy, nonspecific etiology. No seizures or epileptiform discharges were seen throughout the recording. Lora Havens   ECHOCARDIOGRAM COMPLETE  Result Date: 12/07/2020    ECHOCARDIOGRAM REPORT   Patient Name:   NATALE BARBA Date of Exam: 12/07/2020 Medical Rec #:  [id number]        Height:       67.0 in Accession #:    [id number]       Weight:       207.7 lb Date of Birth:  07-Aug-1955        BSA:          2.055 m Patient Age:    73 years         BP:           154/93 mmHg Patient Gender: F                HR:           80 bpm. Exam Location:  Inpatient Procedure: 2D Echo, Cardiac  Doppler, Color Doppler, Intracardiac Opacification            Agent and 3D Echo REPORT CONTAINS CRITICAL RESULT Indications:    R07.9* Chest pain, unspecified  History:        Patient has prior history of Echocardiogram examinations, most                 recent 05/06/2013. CAD, TIA, Signs/Symptoms:Chest Pain; Risk                 Factors:Hypertension and Dyslipidemia. Lupus.  Sonographer:    Roseanna Rainbow RDCS Referring Phys: [id number] [facility]  Sonographer Comments: Technically difficult study due to poor echo windows. IMPRESSIONS  1. Global hypokinesis, the apex and anteroseptum appear akinetic. Marland Kitchen Left ventricular ejection fraction, by estimation, is 25 to  30%. The left ventricle has severely decreased function. The left ventricle demonstrates global hypokinesis. There is mild left ventricular hypertrophy. Left ventricular diastolic parameters are consistent with Grade III diastolic dysfunction (restrictive). Elevated left atrial pressure.  2. Right ventricular systolic function is low normal. The right ventricular size is moderately enlarged. There is moderately elevated pulmonary artery systolic pressure.  3. Left atrial size was moderately dilated.  4. Right atrial size was moderately dilated.  5. The mitral valve is abnormal. Moderate mitral valve regurgitation. No evidence of mitral stenosis.  6. The tricuspid valve is abnormal. Tricuspid valve regurgitation is severe.  7. The aortic valve is tricuspid. There is mild calcification of the aortic valve. There is mild thickening of the aortic valve. Aortic valve regurgitation is not visualized. No aortic stenosis is present.  8. Pulmonic valve regurgitation is moderate.  9. The inferior vena cava is dilated in size with <50% respiratory variability, suggesting right atrial pressure of 15 mmHg. FINDINGS  Left Ventricle: Global hypokinesis, the apex and anteroseptum appear akinetic. Left ventricular ejection fraction, by estimation, is 25 to 30%. The left ventricle has severely decreased function. The left ventricle demonstrates global hypokinesis. Definity contrast agent was given IV to delineate the left ventricular endocardial borders. The left ventricular internal cavity size was normal in size. There is mild left ventricular hypertrophy. Left ventricular diastolic parameters are consistent with Grade III diastolic dysfunction (restrictive). Elevated left atrial pressure. Right Ventricle: Ventricular septum is flattened in diastole consistent with RV volume overload. The right ventricular size is moderately enlarged. Right vetricular wall thickness was not assessed. Right ventricular systolic function is low  normal. There  is moderately elevated pulmonary artery systolic pressure. The tricuspid regurgitant velocity is 3.22 m/s, and with an assumed right atrial pressure of 15 mmHg, the estimated right ventricular systolic pressure is 03.0 mmHg. Left Atrium: Left atrial size was moderately dilated. Right Atrium: Right atrial size was moderately dilated. Pericardium: There is no evidence of pericardial effusion. Mitral Valve: The MV/AV VTI ration is 1.1 suggestion moderate MR. The mitral valve is abnormal. There is mild thickening of the mitral valve leaflet(s). There is mild calcification of the mitral valve leaflet(s). Mild mitral annular calcification. Moderate mitral valve regurgitation. No evidence of mitral valve stenosis. MV peak gradient, 8.0 mmHg. The mean mitral valve gradient is 2.0 mmHg. Tricuspid Valve: The tricuspid valve is abnormal. Tricuspid valve regurgitation is severe. No evidence of tricuspid stenosis. Aortic Valve: The aortic valve is tricuspid. There is mild calcification of the aortic valve. There is mild thickening of the aortic valve. There is mild aortic valve annular calcification. Aortic valve regurgitation is not visualized. No aortic stenosis  is present. Aortic valve  mean gradient measures 5.0 mmHg. Aortic valve peak gradient measures 11.9 mmHg. Aortic valve area, by VTI measures 2.04 cm. Pulmonic Valve: The pulmonic valve was not well visualized. Pulmonic valve regurgitation is moderate. No evidence of pulmonic stenosis. Aorta: The aortic root is normal in size and structure. Venous: The inferior vena cava is dilated in size with less than 50% respiratory variability, suggesting right atrial pressure of 15 mmHg. IAS/Shunts: No atrial level shunt detected by color flow Doppler.  LEFT VENTRICLE PLAX 2D LVIDd:         4.60 cm      Diastology LVIDs:         3.90 cm      LV e' medial:    4.68 cm/s LV PW:         1.20 cm      LV E/e' medial:  29.1 LV IVS:        1.20 cm      LV e' lateral:    10.30 cm/s LVOT diam:     1.90 cm      LV E/e' lateral: 13.2 LV SV:         62 LV SV Index:   30 LVOT Area:     2.84 cm                              3D Volume EF: LV Volumes (MOD)            3D EF:        26 % LV vol d, MOD A2C: 205.0 ml LV EDV:       194 ml LV vol d, MOD A4C: 190.0 ml LV ESV:       143 ml LV vol s, MOD A2C: 163.0 ml LV SV:        51 ml LV vol s, MOD A4C: 144.0 ml LV SV MOD A2C:     42.0 ml LV SV MOD A4C:     190.0 ml LV SV MOD BP:      47.8 ml RIGHT VENTRICLE            IVC RV S prime:     9.36 cm/s  IVC diam: 3.00 cm LEFT ATRIUM             Index        RIGHT ATRIUM           Index LA diam:        4.80 cm 2.34 cm/m   RA Area:     26.00 cm LA Vol (A2C):   53.4 ml 25.99 ml/m  RA Volume:   91.60 ml  44.58 ml/m LA Vol (A4C):   59.2 ml 28.81 ml/m LA Biplane Vol: 56.5 ml 27.50 ml/m  AORTIC VALVE                     PULMONIC VALVE AV Area (Vmax):    2.09 cm      PR End Diast Vel: 2.25 msec AV Area (Vmean):   2.26 cm AV Area (VTI):     2.04 cm AV Vmax:           172.45 cm/s AV Vmean:          100.111 cm/s AV VTI:            0.303 m AV Peak Grad:      11.9 mmHg AV Mean Grad:  5.0 mmHg LVOT Vmax:         127.00 cm/s LVOT Vmean:        79.700 cm/s LVOT VTI:          0.218 m LVOT/AV VTI ratio: 0.72  AORTA Ao Root diam: 3.20 cm Ao Asc diam:  3.30 cm MITRAL VALVE                  TRICUSPID VALVE MV Area (PHT): 5.84 cm       TR Peak grad:   41.5 mmHg MV Area VTI:   2.05 cm       TR Vmax:        322.00 cm/s MV Peak grad:  8.0 mmHg MV Mean grad:  2.0 mmHg       SHUNTS MV Vmax:       1.41 m/s       Systemic VTI:  0.22 m MV Vmean:      68.9 cm/s      Systemic Diam: 1.90 cm MV Decel Time: 130 msec MR Peak grad:    83.9 mmHg MR Mean grad:    62.0 mmHg MR Vmax:         458.00 cm/s MR Vmean:        373.0 cm/s MR PISA:         1.57 cm MR PISA Eff ROA: 13 mm MR PISA Radius:  0.50 cm MV E velocity: 136.00 cm/s MV A velocity: 119.00 cm/s MV E/A ratio:  1.14 Carlyle Dolly MD Electronically signed by  Carlyle Dolly MD Signature Date/Time: 12/07/2020/4:44:12 PM    Final      LOS: 2 days   Antonieta Pert, MD [facility]  12/09/2020, 9:50 AM

## 2020-12-09 NOTE — Progress Notes (Signed)
Speech Language Pathology Treatment: Dysphagia  Patient Details Name: Jenna [name] MRN: [id number] DOB: December 01, 1955 Today's Date: 12/09/2020 Time: 9012-2241 SLP Time Calculation (min) (ACUTE ONLY): 12 min  Assessment / Plan / Recommendation Clinical Impression  Followed up for diet tolerance. Per RN intake remains minimal. Pt with lunch meal at bedside,  untouched despite encouragement. She was agreeable to small amounts of soft solid, regular snack, and thin liquids. Pt with xerostomia, improved following oral care. With regular texture pt with brief mastication following by expectoration from oral cavity. She states generally uses dentures; some natural lower dentition appreciated, no upper dentition noted; no dentures at bedside. With softer solids pt with appropriate mastication and clearance of oral cavity. When asked if softer solids would be easier, pt verbalized yes. No overt s/sx of aspiration with any PO. Recommend dysphagia 3 (mechanical soft) and thin liquids with meds as tolerated (per RN needing crushed at this time). No further ST needs identified.    HPI HPI: 65 Year old female admitted with SOB and lethargy, worsening since prior admission to [facility] from 9/29 until 10/10.  Initial work-up revealed a significant right lower lobe opacity on chest x-ray with stable left basilar opacity and bilateral pleural effusions.  During prior hospitalization patient was managed for acute on chronic congestive heart failure thought to be secondary to decreasing urine output from progressive cardiorenal syndrome.  Patient was evaluated by nephrology but was felt to not be able to tolerate reinitiating hemodialysis.  Patient did have some improvement in symptoms and after coordination with the family was discharged back to her skilled nursing facility on 10/10. Pt previously evaluated by SLP on 07/12/20 - recommended regular, thin, but appetite noted to be poor. Had dentures, but reported she  didnt need them.      SLP Plan  Discharge SLP treatment due to (comment) (suspected maximal potential met)      Recommendations for follow up therapy are one component of a multi-disciplinary discharge planning process, led by the attending physician.  Recommendations may be updated based on patient status, additional functional criteria and insurance authorization.    Recommendations  Diet recommendations: Dysphagia 3 (mechanical soft);Thin liquid Liquids provided via: Straw;Cup Medication Administration: Crushed with puree Supervision: Staff to assist with self feeding;Full supervision/cueing for compensatory strategies Compensations: Minimize environmental distractions;Slow rate;Small sips/bites;Follow solids with liquid Postural Changes and/or Swallow Maneuvers: Upright 30-60 min after meal;Seated upright 90 degrees                Oral Care Recommendations: Oral care BID;Staff/trained caregiver to provide oral care Follow up Recommendations: Other (comment) (TBD) SLP Visit Diagnosis: Dysphagia, oral phase (R13.11);Dysphagia, unspecified (R13.10) Plan: Discharge SLP treatment due to (comment) (suspected maximal potential met)       GO                Hayden Rasmussen MA, CCC-SLP Acute Rehabilitation Services    12/09/2020, 3:24 PM

## 2020-12-09 NOTE — Plan of Care (Signed)

## 2020-12-09 NOTE — Progress Notes (Signed)
Palliative:  HPI:  65 y.o. 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     I met today at Jenna [name]'s bedside and she is able to awaken and speak with me. She appears to be comfortable and denies pain. She reports that she is feeling "okay" today. She asks for water. She tells me multiple times that she is "tired." I asked her what she meant about saying tired like she needs a nap or like her body it tired because it has gone through so much and she tells me "both." I did ask her is she felt that God is getting ready for her and she pauses and then says "I don't think so" and then I asked if she was ready when God is ready for her and she says "maybe." I allowed her time to rest.   I returned in the afternoon to speak with Jenna [name], who is present at bedside. I shared with Jenna [name] my conversation with her mother this morning and Jenna [name] shares that these answers do not surprise her. Jenna [name] shares that their pastor and the doctor have been able to confirm Jenna [name]'s desire to continue aggressive care and full code status. Jenna [name] shares that she is happy that her mother has been able to make these decisions for herself. Jenna [name] has also held firm that she does NOT desire dialysis and Jenna [name] supports all her mother's decisions. Jenna [name] shares her understanding of her mother's condition and expectations and she wishes to tailor her mother's care to her mother's wishes. We were able to get from Jenna [name] that she continues to have poor appetite  although is drinking a little water today. She reports that she does not have desire to work with therapy but may be ready to work with them tomorrow. Ultimately, she continues to have the desire and motivation to continue to try and improve and get stronger. We will just need time to see how her physical body will allow her to progress. We will continue to follow and take one day at a time. Noted that they are very concerned about next step and placement from the hospital but may need a little more time to see how she does to know what will be most appropriate. If her chest pain reoccurs this could further change our goals and plans.   All questions/concerns addressed. Emotional support provided.   Exam: More alert although tired. Smiles and she is mostly appropriate with some confusion noted at times. Breathing regular, unlabored. No chest pain today. Abd soft. BLE edema.   Plan: - Full code, full scope treatment desired.  - However NO dialysis.  - Treat chest pain episodes as needed.  - Ongoing support and goals of care discussions.   [place], NP Palliative Medicine Team Pager [phone] (Please see [email or web address] for schedule) Team Phone [phone]    Greater than 50%  of this time was spent counseling and coordinating care related to the above assessment and plan

## 2020-12-09 NOTE — Consult Note (Signed)
[facility] Renal Consultation Note  Requesting MD: Jenna [name] Indication for Consultation: advanced CKD in transplant patient  HPI: Jenna [name] is a 65 y.o. female with a significant past medical history to include lupus and lupus nephritis with resultant ESRD, then status posttransplant in February 2020.  She also has coronary artery disease, ischemic cardiomyopathy with ejection fraction of 20 to 25%, atrial fibrillation with left atrial thrombus, hypertension, neuropathy, anxiety disorder and also fairly recent CMV viremia.  She has had very tumultuous last several months with multiple hospitalizations, stays in skilled nursing facilities.  Her most recent admission is for shortness of breath and chest pain.  It is noted that her creatinine is elevated now over 5 with BUN over 130.  Patient has been more somnolent and failing to thrive as above.  Palliative care has been involved during this hospitalization as it is felt that patient has a limited life expectancy.  Patient is followed by Dr. Johnney [name] at [facility] where resuming dialysis had not been specifically discussed it seems.  There is notation in the chart with palliative care the patient is not desirous of returning to dialysis understanding the consequences.  I spoke to Jenna [name] today.  She is somnolent but is my understanding that she had some understanding of what I was saying.  I mentioned the resumption of dialysis.  It was in response to this I got the most aggressive nodding her head no during her whole conversation.  I repeated the question and she repeated the response.  I asked her if she understood what this decision meant for her.  She nodded affirmatively that she did.  In addition, I spoke to Jenna [name].  She tells me that for right now she wants to abide by her mother's wishes and not reinitiate dialysis. She does have a patent AVF  Creat  Date/Time Value Ref Range Status   05/23/2014 01:01 PM 2.11 (H) 0.50 - 1.10 mg/dL Final  04/28/2011 06:37 PM 1.52 (H) 0.50 - 1.10 mg/dL Final   Creatinine, Ser  Date/Time Value Ref Range Status  12/09/2020 03:08 AM 5.16 (H) 0.44 - 1.00 mg/dL Final  12/08/2020 02:42 AM 4.95 (H) 0.44 - 1.00 mg/dL Final  12/07/2020 05:00 AM 4.99 (H) 0.44 - 1.00 mg/dL Final  12/06/2020 08:14 PM 5.01 (H) 0.44 - 1.00 mg/dL Final  11/24/2020 03:59 AM 5.37 (H) 0.44 - 1.00 mg/dL Final  11/22/2020 04:29 AM 5.25 (H) 0.44 - 1.00 mg/dL Final  11/21/2020 08:27 AM 5.22 (H) 0.44 - 1.00 mg/dL Final  11/20/2020 08:06 AM 5.02 (H) 0.44 - 1.00 mg/dL Final  11/19/2020 03:55 AM 4.64 (H) 0.44 - 1.00 mg/dL Final  11/18/2020 03:55 AM 4.48 (H) 0.44 - 1.00 mg/dL Final  11/17/2020 07:25 PM 4.00 (H) 0.44 - 1.00 mg/dL Final  11/17/2020 03:39 AM 3.85 (H) 0.44 - 1.00 mg/dL Final  11/16/2020 07:04 AM 3.95 (H) 0.44 - 1.00 mg/dL Final  11/15/2020 04:37 AM 3.59 (H) 0.44 - 1.00 mg/dL Final  11/14/2020 05:48 AM 3.54 (H) 0.44 - 1.00 mg/dL Final  11/13/2020 04:23 PM 3.80 (H) 0.44 - 1.00 mg/dL Final  11/13/2020 04:12 PM 3.59 (H) 0.44 - 1.00 mg/dL Final  11/13/2020 02:42 PM  0.44 - 1.00 mg/dL Final   SPECIMEN HEMOLYZED. HEMOLYSIS MAY AFFECT INTEGRITY OF RESULTS.    Comment:    INFORMED LISA IN ED AT 1544 11/13/20 MULLINS,T SEE RECOLLECT [id number]   11/11/2020 10:44 AM 3.47 (H) 0.44 - 1.00 mg/dL Final  11/11/2020  06:01 AM 3.53 (H) 0.44 - 1.00 mg/dL Final  11/10/2020 02:23 AM 3.40 (H) 0.44 - 1.00 mg/dL Final  11/09/2020 05:39 AM 3.62 (H) 0.44 - 1.00 mg/dL Final  11/08/2020 05:59 AM 3.79 (H) 0.44 - 1.00 mg/dL Final  11/07/2020 02:33 AM 4.24 (H) 0.44 - 1.00 mg/dL Final  11/06/2020 04:39 AM 4.55 (H) 0.44 - 1.00 mg/dL Final  11/05/2020 05:07 AM 4.93 (H) 0.44 - 1.00 mg/dL Final  11/04/2020 03:52 AM 4.72 (H) 0.44 - 1.00 mg/dL Final  11/03/2020 07:03 AM 4.72 (H) 0.44 - 1.00 mg/dL Final  11/02/2020 06:32 AM 4.65 (H) 0.44 - 1.00 mg/dL Final  11/01/2020 12:00 PM 4.73 (H) 0.44 - 1.00  mg/dL Final  10/15/2020 12:18 PM 3.33 (H) 0.44 - 1.00 mg/dL Final  08/26/2020 06:26 AM 3.05 (H) 0.44 - 1.00 mg/dL Final  08/23/2020 01:49 AM 3.18 (H) 0.44 - 1.00 mg/dL Final  08/22/2020 05:42 AM 3.30 (H) 0.44 - 1.00 mg/dL Final  08/21/2020 10:53 AM 3.43 (H) 0.44 - 1.00 mg/dL Final  07/30/2020 01:30 PM 3.42 (H) 0.40 - 1.20 mg/dL Final  07/12/2020 11:48 AM 3.42 (H) 0.44 - 1.00 mg/dL Final  07/12/2020 02:53 AM 3.28 (H) 0.44 - 1.00 mg/dL Final  07/11/2020 11:45 AM 2.67 (H) 0.44 - 1.00 mg/dL Final  07/11/2020 04:06 AM 3.27 (H) 0.44 - 1.00 mg/dL Final  06/22/2019 07:53 PM 2.35 (H) 0.44 - 1.00 mg/dL Final  10/14/2018 11:09 PM 1.62 (H) 0.44 - 1.00 mg/dL Final  09/18/2017 05:49 PM 3.76 (H) 0.44 - 1.00 mg/dL Final  08/02/2017 08:14 PM 2.32 (H) 0.44 - 1.00 mg/dL Final  08/02/2017 06:15 PM 1.89 (H) 0.44 - 1.00 mg/dL Final  07/05/2017 12:38 PM 5.70 (H) 0.44 - 1.00 mg/dL Final  07/04/2017 07:51 AM 5.07 (H) 0.44 - 1.00 mg/dL Final  06/30/2017 08:19 PM 4.51 (H) 0.44 - 1.00 mg/dL Final  06/29/2017 09:26 AM 4.94 (H) 0.44 - 1.00 mg/dL Final  06/28/2017 06:29 AM 5.65 (H) 0.44 - 1.00 mg/dL Final  06/27/2017 03:53 PM 5.49 (H) 0.44 - 1.00 mg/dL Final  06/23/2017 04:00 PM 5.70 (H) 0.44 - 1.00 mg/dL Final     PMHx:   Past Medical History:  Diagnosis Date   Anemia    Anxiety    panic attack- talks herself and takes deep breathes   CAD (coronary artery disease)    a. STEMI 10/2018 s/p DES to LAD.   Depression    Dyspnea    with exertion    ESRD (end stage renal disease) ([place])    hemo TTHSAT   Essential hypertension    FSGS (focal segmental glomerulosclerosis) 2011   By renal biopsy   Head injury    age 64   HFrEF (heart failure with reduced ejection fraction) ([place])    History of kidney stones    kidney stone   Hx of lupus nephritis 2011   by renal biopsy   Ischemic cardiomyopathy    Kidney transplant recipient    LA thrombus 10/2018   Lupus (systemic lupus erythematosus) ([place])    followed  by Dr. 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 remote parietal infarct on brain MRI 12/2019.   Pericarditis    age 49ish   Renal stone    Secondary hyperparathyroidism of renal origin ([place])    SLE (systemic lupus erythematosus) ([name])    TIA (transient ischemic attack)    no  residual effects    Past Surgical History:  Procedure Laterality Date   AV FISTULA PLACEMENT Left 07/04/2017   Procedure: ARTERIOVENOUS (AV) FISTULA CREATION BRACHIOCEPHALIC;  Surgeon: Larina Earthly, MD;  Location: [facility] OR;  Service: Vascular;  Laterality: Left;   COLONOSCOPY W/ POLYPECTOMY     IR FLUORO GUIDE CV LINE RIGHT  06/28/2017   IR US GUIDE VASC ACCESS RIGHT  06/28/2017   KIDNEY SURGERY     kidney transplant 2020   REVISION OF ARTERIOVENOUS GORETEX GRAFT Left 11/30/2017   Procedure: REVISION OF ARTERIOVENOUS FISTULA LEFT ARM Superfistulization and branch ligation.;  Surgeon: Maeola Harman, MD;  Location[facility] OR;  Service: Vascular;  Laterality: Left;    Family Hx:  Family History  Problem Relation Age of Onset   Diabetes Mother    Hypertension Father    Cancer Sister    Hypertension Brother     Social History:  reports that she has never smoked. She has been exposed to tobacco smoke. She has never used smokeless tobacco. She reports that she does not drink alcohol and does not use drugs.  Allergies:  Allergies  Allergen Reactions   Enalapril Maleate Anaphylaxis, Swelling and Other (See Comments)    Throat swells   Penicillins Other (See Comments)    Made patient lightheaded PATIENT HAS HAD A PCN REACTION WITH IMMEDIATE RASH, FACIAL/TONGUE/THROAT SWELLING, SOB, OR LIGHTHEADEDNESS WITH HYPOTENSION:  #  #  YES  #  #  Has patient had a PCN reaction causing severe rash involving mucus membranes or skin necrosis: No Has patient had a PCN reaction that required hospitalization: No Has patient had a  PCN reaction occurring within the last 10 years: No If all of the above answers are "NO", then may proceed with Cephalosporin use.    Chocolate Nausea And Vomiting   Tape Itching, Rash and Other (See Comments)    Medications: Prior to Admission medications   Medication Sig Start Date End Date Taking? Authorizing Provider  acetaminophen (TYLENOL) 500 MG tablet Take 1,000 mg by mouth every 6 (six) hours as needed for headache.   Yes [provider]  ALPRAZolam (XANAX) 0.25 MG tablet Take 1 tablet (0.25 mg total) by mouth at bedtime as needed for anxiety. 11/11/20  Yes Sheikh, Omair Latif, DO  aspirin 81 MG EC tablet Take 81 mg by mouth every morning. 11/21/19  Yes [provider]  citalopram (CELEXA) 10 MG tablet Take 1 tablet (10 mg total) by mouth daily. 10/29/20  Yes [place], Betty G, MD  hydroxychloroquine (PLAQUENIL) 200 MG tablet Take 1 tablet (200 mg total) by mouth 2 (two) times daily. 12/25/13  Yes Jegede, Olugbemiga E, MD  isosorbide-hydrALAZINE (BIDIL) 20-37.5 MG tablet Take 1 tablet by mouth 3 (three) times daily. 08/26/20  Yes Calvert Cantor, MD  mirtazapine (REMERON) 7.5 MG tablet Take 7.5 mg by mouth at bedtime.   Yes [provider]  nitroGLYCERIN (NITROSTAT) 0.4 MG SL tablet Place 0.4 mg under the tongue every 5 (five) minutes as needed for chest pain.    Yes [provider]  ondansetron (ZOFRAN) 4 MG tablet Take 1 tablet (4 mg total) by mouth every 6 (six) hours as needed for nausea. 11/11/20  Yes Sheikh, Omair Latif, DO  oxyCODONE (OXY IR/ROXICODONE) 5 MG immediate release tablet Take 5 mg by mouth every 4 (four) hours as needed for severe pain.   Yes [provider]  pantoprazole sodium (PROTONIX) 40 mg Place 40 mg into feeding tube daily.  Yes [provider]  predniSONE (DELTASONE) 5 MG tablet Take 5 mg by mouth daily. 05/16/19  Yes [provider]  rosuvastatin (CRESTOR) 5 MG tablet Take 5 mg by mouth daily at 6 PM.  05/02/19  Yes [provider]  simethicone (MYLICON) 80 MG chewable tablet Chew 80 mg by mouth every 6 (six) hours as needed for flatulence.   Yes [provider]  sodium bicarbonate 650 MG tablet Take 1 tablet (650 mg total) by mouth 3 (three) times daily. 11/11/20  Yes Sheikh, Omair Latif, DO  sodium zirconium cyclosilicate (LOKELMA) 5 g packet Take 5 g by mouth daily.   Yes [provider]  tacrolimus (PROGRAF) 0.5 MG capsule Take 1 capsule (0.5 mg total) by mouth daily. Patient taking differently: Take 0.5 mg by mouth 2 (two) times daily. 11/12/20  Yes Sheikh, Omair Latif, DO  valGANciclovir (VALCYTE) 450 MG tablet Take 450 mg by mouth every other day.   Yes [provider]    I have reviewed the patient's current medications.  Labs:  Results for orders placed or performed during the hospital encounter of 12/06/20 (from the past 48 hour(s))  Troponin I (High Sensitivity)     Status: Abnormal   Collection Time: 12/07/20  1:17 PM  Result Value Ref Range   Troponin I (High Sensitivity) 198 (HH) <18 ng/L    Comment: CRITICAL RESULT CALLED TO, READ BACK BY AND VERIFIED WITH: C COBB RN BY SSTEPHENS [id number] (NOTE) Elevated high sensitivity troponin I (hsTnI) values and significant  changes across serial measurements may suggest ACS but many other  chronic and acute conditions are known to elevate hsTnI results.  Refer to the Links section for chest pain algorithms and additional  guidance. Performed at [facility], [place] [address], [place], [place] [address]   Culture, blood (routine x 2)     Status: None (Preliminary result)   Collection Time: 12/07/20  2:08 PM   Specimen: BLOOD  Result Value Ref Range   Specimen Description BLOOD SITE NOT SPECIFIED    Special Requests      BOTTLES DRAWN AEROBIC ONLY Blood Culture results may not be optimal due to an excessive volume of blood received in culture bottles   Culture      NO GROWTH 2  DAYS Performed at [facility], [place] [address], [place], [place] [address]    Report Status PENDING   Blood gas, arterial     Status: Abnormal   Collection Time: 12/07/20  5:00 PM  Result Value Ref Range   FIO2 21.00    pH, Arterial 7.432 7.350 - 7.450   pCO2 arterial 25.3 (L) 32.0 - 48.0 mmHg   pO2, Arterial 91.5 83.0 - 108.0 mmHg   Bicarbonate 16.6 (L) 20.0 - 28.0 mmol/L   Acid-base deficit 6.9 (H) 0.0 - 2.0 mmol/L   O2 Saturation 97.7 %   Patient temperature 37.0    Collection site RIGHT BRACHIAL    Drawn by [id number]    Sample type ARTERIAL    Allens test (pass/fail) PASS PASS    Comment: Performed at [facility], [place] [address] [place], [place] [address]  Basic metabolic panel     Status: Abnormal   Collection Time: 12/08/20  2:42 AM  Result Value Ref Range   Sodium 139 135 - 145 mmol/L   Potassium 4.3 3.5 - 5.1 mmol/L   Chloride 109 98 - 111 mmol/L   CO2 16 (L) 22 -  32 mmol/L   Glucose, Bld 88 70 - 99 mg/dL    Comment: Glucose reference range applies only to samples taken after fasting for at least 8 hours.   BUN 126 (H) 8 - 23 mg/dL   Creatinine, Ser 4.95 (H) 0.44 - 1.00 mg/dL   Calcium 9.6 8.9 - 10.3 mg/dL   GFR, Estimated 9 (L) >60 mL/min    Comment: (NOTE) Calculated using the CKD-EPI Creatinine Equation (2021)    Anion gap 14 5 - 15    Comment: Performed at [facility] [address] [place], [place] [address]  Magnesium     Status: Abnormal   Collection Time: 12/08/20  2:42 AM  Result Value Ref Range   Magnesium 3.1 (H) 1.7 - 2.4 mg/dL    Comment: Performed at [facility] [address], [place], [place] [address]  CBC     Status: Abnormal   Collection Time: 12/08/20  2:42 AM  Result Value Ref Range   WBC 3.9 (L) 4.0 - 10.5 K/uL   RBC 3.78 (L) 3.87 - 5.11 MIL/uL   Hemoglobin 10.9 (L) 12.0 - 15.0 g/dL   HCT 34.7 (L) 36.0 - 46.0 %   MCV 91.8 80.0 - 100.0 fL   MCH 28.8 26.0 - 34.0 pg   MCHC 31.4 30.0 - 36.0 g/dL   RDW 20.4 (H)  11.5 - 15.5 %   Platelets 64 (L) 150 - 400 K/uL    Comment: Immature Platelet Fraction may be clinically indicated, consider ordering this additional test [id number] CONSISTENT WITH PREVIOUS RESULT REPEATED TO VERIFY    nRBC 1.3 (H) 0.0 - 0.2 %    Comment: Performed at [facility], [place] [address] [place], [place] [address]  Ammonia     Status: None   Collection Time: 12/08/20  2:42 AM  Result Value Ref Range   Ammonia 28 9 - 35 umol/L    Comment: Performed at [facility], [place] [address], [place], [place] [address]  Basic metabolic panel     Status: Abnormal   Collection Time: 12/09/20  3:08 AM  Result Value Ref Range   Sodium 143 135 - 145 mmol/L   Potassium 5.3 (H) 3.5 - 5.1 mmol/L   Chloride 112 (H) 98 - 111 mmol/L   CO2 14 (L) 22 - 32 mmol/L   Glucose, Bld 72 70 - 99 mg/dL    Comment: Glucose reference range applies only to samples taken after fasting for at least 8 hours.   BUN 136 (H) 8 - 23 mg/dL   Creatinine, Ser 5.16 (H) 0.44 - 1.00 mg/dL   Calcium 9.8 8.9 - 10.3 mg/dL   GFR, Estimated 9 (L) >60 mL/min    Comment: (NOTE) Calculated using the CKD-EPI Creatinine Equation (2021)    Anion gap 17 (H) 5 - 15    Comment: Performed at [facility] [address], [place], [place] [address]  CBC     Status: Abnormal   Collection Time: 12/09/20  5:57 AM  Result Value Ref Range   WBC 4.3 4.0 - 10.5 K/uL   RBC 3.94 3.87 - 5.11 MIL/uL   Hemoglobin 11.5 (L) 12.0 - 15.0 g/dL   HCT 37.0 36.0 - 46.0 %   MCV 93.9 80.0 - 100.0 fL   MCH 29.2 26.0 - 34.0 pg   MCHC 31.1 30.0 - 36.0 g/dL   RDW 20.8 (H) 11.5 - 15.5 %   Platelets 66 (L) 150 -  400 K/uL    Comment: Immature Platelet Fraction may be clinically indicated, consider ordering this additional test [id number] CONSISTENT WITH PREVIOUS RESULT REPEATED TO VERIFY    nRBC 1.6 (H) 0.0 - 0.2 %    Comment: Performed at [facility], [place] [address] [place], [place] [address]     ROS:  Review of  systems not obtained due to patient factors.  Physical Exam: Vitals:   12/09/20 0434 12/09/20 0813  BP: (!) 152/79 (!) 156/88  Pulse: 81 82  Resp: 18 16  Temp: (!) 97.4 F (36.3 C) 98.3 F (36.8 C)  SpO2: 96% 99%     General: Somnolent, eyes closed.  The strongest reaction I got during conversation was when I asked about resuming dialysis.  She strongly shakes her head no and repeated to that action to second similar question HEENT: Pupils are equal round reactive to light, extraocular motions are intact Neck: Positive for JVD Heart: Irregularly irregular Lungs: Poor effort, decreased breath sounds at the bases Abdomen: Soft, nontender, nondistended Extremities: Pitting edema throughout Skin: Warm and dry Neuro: Somnolent but nonfocal Left upper arm AV fistula with good thrill and bruit  Assessment/Plan: 65 year old black female with lupus, coronary artery disease with ischemic cardiomyopathy and a failing kidney transplant with failure to thrive 1.Renal-appears to have a failing kidney transplant and now stage V CKD with a GFR under 10.  Her BUN is over 130.  I am sure her kidney function is having some impact in her failure to thrive.  The resumption of dialysis possibly could make her less uremic and improve her volume status, however, the patient has made her wishes known that she does not wish to resume dialysis and understands the consequences of that decision.  The family is on board with this decision.  Obviously, there is no way to know how she would tolerate dialysis, she could certainly continue to have the same issues with frequent hospitalizations and continued declining health.  I am not going to try to convince anyone that dialysis is the best way to go.  Therefore, patient, family and myself are all on the same page did not resume dialysis 2. Hypertension/volume  - volume overloaded -  had almost a liter of urine on lasix 80 bid-  not hypoxic-  will not change as trying to  thread the needle here    3. Anemia  - not a significant issue at this time  4. Metabolic Acidosis-  cont on bicarb    Tequan Redmon A Destinee Taber 12/09/2020, 12:08 PM

## 2020-12-10 DIAGNOSIS — I5023 Acute on chronic systolic (congestive) heart failure: Secondary | ICD-10-CM | POA: Diagnosis not present

## 2020-12-10 DIAGNOSIS — Z66 Do not resuscitate: Secondary | ICD-10-CM

## 2020-12-10 DIAGNOSIS — N189 Chronic kidney disease, unspecified: Secondary | ICD-10-CM | POA: Diagnosis not present

## 2020-12-10 DIAGNOSIS — N185 Chronic kidney disease, stage 5: Secondary | ICD-10-CM | POA: Diagnosis not present

## 2020-12-10 DIAGNOSIS — Z7189 Other specified counseling: Secondary | ICD-10-CM | POA: Diagnosis not present

## 2020-12-10 DIAGNOSIS — I132 Hypertensive heart and chronic kidney disease with heart failure and with stage 5 chronic kidney disease, or end stage renal disease: Secondary | ICD-10-CM | POA: Diagnosis not present

## 2020-12-10 LAB — BASIC METABOLIC PANEL
Anion gap: 16 — ABNORMAL HIGH (ref 5–15)
BUN: 136 mg/dL — ABNORMAL HIGH (ref 8–23)
CO2: 17 mmol/L — ABNORMAL LOW (ref 22–32)
Calcium: 10 mg/dL (ref 8.9–10.3)
Chloride: 108 mmol/L (ref 98–111)
Creatinine, Ser: 5.42 mg/dL — ABNORMAL HIGH (ref 0.44–1.00)
GFR, Estimated: 8 mL/min — ABNORMAL LOW (ref >60)
Glucose, Bld: 83 mg/dL (ref 70–99)
Potassium: 4.5 mmol/L (ref 3.5–5.1)
Sodium: 141 mmol/L (ref 135–145)

## 2020-12-10 LAB — CBC
HCT: 39.1 % (ref 36.0–46.0)
Hemoglobin: 12.1 g/dL (ref 12.0–15.0)
MCH: 28.7 pg (ref 26.0–34.0)
MCHC: 30.9 g/dL (ref 30.0–36.0)
MCV: 92.7 fL (ref 80.0–100.0)
Platelets: 69 10*3/uL — ABNORMAL LOW (ref 150–400)
RBC: 4.22 MIL/uL (ref 3.87–5.11)
RDW: 20.9 % — ABNORMAL HIGH (ref 11.5–15.5)
WBC: 5 10*3/uL (ref 4.0–10.5)
nRBC: 3.4 % — ABNORMAL HIGH (ref 0.0–0.2)

## 2020-12-10 LAB — TACROLIMUS LEVEL: Tacrolimus (FK506) - LabCorp: 4.8 ng/mL (ref 2.0–20.0)

## 2020-12-10 LAB — PATHOLOGIST SMEAR REVIEW

## 2020-12-10 NOTE — Progress Notes (Signed)
Subjective:  is alert, conversive-  had mittens on and wants them off -  hemodynamically stable -  no UOP recorded -  BUN stable at 136 and crt up  Objective Vital signs in last 24 hours: Vitals:   12/09/20 1921 12/09/20 2038 12/10/20 0051 12/10/20 0635  BP: (!) 145/92 (!) 147/95 (!) 142/92 (!) 151/96  Pulse:  79 81 83  Resp:  $Remo'17 16 18  'oioGq$ Temp:  (!) 97.5 F (36.4 C) (!) 97.4 F (36.3 C) 98.2 F (36.8 C)  TempSrc:  Oral Axillary Axillary  SpO2:  100%  96%  Weight:    92.4 kg  Height:       Weight change: -2 kg  Intake/Output Summary (Last 24 hours) at 12/10/2020 1055 Last data filed at 12/09/2020 1200 Gross per 24 hour  Intake 60 ml  Output --  Net 60 ml    Assessment/Plan: 65 year old black female with lupus, coronary artery disease with ischemic cardiomyopathy and a failing kidney transplant with failure to thrive 1.Renal-appears to have a failing kidney transplant and now stage V CKD with a GFR under 10.  Her BUN is over 130.  I am sure her kidney function is having some impact in her failure to thrive.  The resumption of dialysis possibly could make her less uremic and improve her volume status, however, the patient has made her wishes known that she does not wish to resume dialysis and understands the consequences of that decision.  The family is on board with this decision.  Obviously, there is no way to know how she would tolerate dialysis, she could certainly continue to have the same issues with frequent hospitalizations and continued declining health.  I am not going to try to convince anyone that dialysis is the best way to go.  Therefore, patient, family and myself are all on the same page to not resume dialysis 2. Hypertension/volume  - volume overloaded -  had almost a liter of urine on lasix 80 bid-  not hypoxic-  will not change as trying to thread the needle here  -  if stable for discharge could change to PO   3. Anemia  - not a significant issue at this time  4.  Metabolic Acidosis-  cont on bicarb  5. Hyperkalemia-  intermittent-  using lokelma PRN  Unfortunately renal does not have a lot more to offer at this time.  It is not a great solution to say no to dialysis but yes to everything else-  will be stuck in this place of just not doing well clinically-  palliative discussions to be continued.  Renal will sign off, please call if we can be of any further assist    Louis Meckel    Labs: Basic Metabolic Panel: Recent Labs  Lab 12/08/20 0242 12/09/20 0308 12/10/20 0545  NA 139 143 141  K 4.3 5.3* 4.5  CL 109 112* 108  CO2 16* 14* 17*  GLUCOSE 88 72 83  BUN 126* 136* 136*  CREATININE 4.95* 5.16* 5.42*  CALCIUM 9.6 9.8 10.0   Liver Function Tests: Recent Labs  Lab 12/06/20 2014 12/07/20 0500  AST 28 28  ALT 19 18  ALKPHOS 138* 130*  BILITOT 1.6* 1.5*  PROT 7.0 6.6  ALBUMIN 3.1* 3.0*   Recent Labs  Lab 12/06/20 2014  LIPASE 22   Recent Labs  Lab 12/08/20 0242  AMMONIA 28   CBC: Recent Labs  Lab 12/06/20 2014 12/07/20 0500 12/07/20 0554 12/08/20 0242  12/09/20 0557 12/10/20 0311  WBC 3.4* 3.4*  --  3.9* 4.3 5.0  NEUTROABS 2.9 2.8  --   --   --   --   HGB 11.4* 11.2*   < > 10.9* 11.5* 12.1  HCT 35.9* 35.0*   < > 34.7* 37.0 39.1  MCV 91.1 91.1  --  91.8 93.9 92.7  PLT 74* 68*  --  64* 66* 69*   < > = values in this interval not displayed.   Cardiac Enzymes: No results for input(s): CKTOTAL, CKMB, CKMBINDEX, TROPONINI in the last 168 hours. CBG: No results for input(s): GLUCAP in the last 168 hours.  Iron Studies: No results for input(s): IRON, TIBC, TRANSFERRIN, FERRITIN in the last 72 hours. Studies/Results: EEG adult  Result Date: 12/08/2020 Lora Havens, MD     12/08/2020 12:51 PM Patient Name: BLASA RAISCH MRN: [id number] Epilepsy Attending: Lora Havens Referring Physician/Provider: Dr Dessa Phi Date: 12/08/2020 Duration: 23.18 mins Patient history: Level of alertness: Awake AEDs  during EEG study: Technical aspects: This EEG study was done with scalp electrodes positioned according to the 10-20 International system of electrode placement. Electrical activity was acquired at a sampling rate of $Remov'500Hz'QvxjVZ$  and reviewed with a high frequency filter of $RemoveB'70Hz'iCoNHyJd$  and a low frequency filter of $RemoveB'1Hz'iyFYBDDI$ . EEG data were recorded continuously and digitally stored. Description: EEG showed continued generalized lumbar every 3-5 hours theta-delta slowing.  Hyperventilation and photic stimulation were not performed.   ABNORMALITY - Continuous slow, generalized IMPRESSION: This study is suggestive of moderate diffuse encephalopathy, nonspecific etiology. No seizures or epileptiform discharges were seen throughout the recording. Priyanka Barbra Sarks   Medications: Infusions:   Scheduled Medications:  aspirin EC  81 mg Oral q morning   citalopram  10 mg Oral Daily   furosemide  80 mg Intravenous BID   heparin  5,000 Units Subcutaneous Q8H   hydroxychloroquine  200 mg Oral Daily   isosorbide-hydrALAZINE  1 tablet Oral TID   pantoprazole (PROTONIX) IV  40 mg Intravenous Q12H   predniSONE  5 mg Oral Q breakfast   rosuvastatin  5 mg Oral q1800   sodium bicarbonate  650 mg Oral TID with meals   sodium zirconium cyclosilicate  10 g Oral Daily   tacrolimus  0.5 mg Oral Daily   valGANciclovir  450 mg Oral QODAY    have reviewed scheduled and prn medications.  Physical Exam: General:  alert, mittens on Heart: RRR Lungs: clear Abdomen: soft, non tender Extremities: peripheral pitting edema  Dialysis Access: left AVF-  good thrill and bruit    12/10/2020,10:55 AM  LOS: 3 days

## 2020-12-10 NOTE — Progress Notes (Addendum)
PROGRESS NOTE    Jenna [name]  OBS:[id number] DOB: 1955/10/27 DOA: 12/06/2020 PCP: [name], Betty G, MD    Brief Narrative:  Jenna [name], 65 y.o. 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  Was recently hospitalized for congestive heart failure.  Subsequently discharged to skilled nursing facility.  Rehospitalized due to worsening lethargy.   Subjective: Patient denies any pain currently.  Seems to be somewhat distracted.  Does not appear to be in any distress.  Complains of shortness of breath.   Assessment & Plan:  Progressive cardiorenal failure Acute on chronic systolic heart failure: Repeat echo EF 25-30% lvef, grade 3 DD moderately elevated pulmonary artery systolic pressure. Previous EF 20-25% in July. She is followed by cardiology at [facility] / Dr. Lamonte Sakai. Chest x-ray this admission with persistent pulmonary edema, bilateral pleural effusion and infiltrates with fluid overload.  Has been poorly responsive to torsemide previously with significant rise in BUN.  Currently on intravenous furosemide.  Continue strict ins and outs and daily weights.  Her creatinine continues to rise along with her BUN.  Prognosis seems to be guarded to poor   Chest pain CAD/ischemic cardiomyopathy: Troponin has been flat 184 >> 183 >> 179, due to CKD.  Not a candidate for cardiac cath considering her advanced tenuous renal function, repeat echo EF 25-30% global hypokinesia.  No chest pain today.  She is on aspirin and Crestor.  CKD stage V with  uremic encephalopathy/metabolic acidosis/hyperkalemia History of SLE S/P renal transplant in April 2020 followed by Dr. Johnney Ou Baseline creatinine around 5.  She is on Prograf/prednisone.   Patient has made her wishes known that she does not wish to resume dialysis and she understands the consequence of the decision. Nephrology was consulted to further address this issue.  Appreciate their assistance.  No plans to initiate dialysis at this time based on their discussion with the patient. Potassium level is better today.  Anemia of chronic renal disease/thrombocytopenia Hemoglobin is stable.  No evidence of overt blood loss. Platelet counts have been low at least since August.  Stable.  No evidence for bleeding.  Etiology is unclear.  Acute encephalopathy uremic encephalopathy:  Suffering from progressive uremic encephalopathy due to advanced renal disease. CT head negative on admission. EEG without seizure activity.  Ammonia normal.   Continue supportive care delirium precaution avoid psychotropic medication  Paroxysmal atrial fibrillation Monitor on telemetry.  Not noted to be on anticoagulation.  Not on any rate limiting drugs either.    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 Continue outpatient regimen of valganciclovir  Goals of care:  Palliative care continues to follow.  Have been communicating with patient's daughter.  Prognosis is guarded to poor.  However patient remains full code at this time.  Class I Obesity:Patient's Body mass index is 31.9 kg/m. : Will benefit with PCP follow-up, weight loss  healthy lifestyle and outpatient sleep evaluation.  DVT prophylaxis: Subcutaneous heparin Code Status: Full code Family Communication: plan of care discussed with patient at bedside. Disposition: Unclear  Status is: Inpatient  Remains inpatient appropriate because: For ongoing  management of complex medical  comorbidities, ongoing palliative care discussion   Objective: Vitals last 24 hrs: Vitals:   12/09/20 1921 12/09/20 2038 12/10/20 0051 12/10/20 0635  BP: (!) 145/92 (!) 147/95 (!) 142/92 (!) 151/96  Pulse:  79 81 83  Resp:  $Remo'17 16 18  'ygFif$ Temp:  (!) 97.5 F (36.4 C) (!) 97.4 F (36.3 C) 98.2 F (36.8 C)  TempSrc:  Oral Axillary Axillary  SpO2:  100%  96%  Weight:    92.4 kg  Height:       Weight change: -2 kg  Intake/Output Summary (Last 24 hours) at 12/10/2020 1154 Last data filed at 12/09/2020 1200 Gross per 24 hour  Intake 60 ml  Output --  Net 60 ml   Net IO Since Admission: -515 mL [12/10/20 1154]   Physical Examination:  General appearance: Awake Distracted.  In no distress Resp: Mildly tachypneic.  Diminished air entry at the bases with few crackles.  No wheezing or rhonchi. Cardio: S1-S2 is normal regular.  No S3-S4.  No rubs murmurs or bruit GI: Abdomen is soft.  Nontender nondistended.  Bowel sounds are present normal.  No masses organomegaly Extremities: Significant edema noted bilateral lower extremity Neurologic:  No focal neurological deficits.     Medications reviewed:  Scheduled Meds:  aspirin EC  81 mg Oral q morning   citalopram  10 mg Oral Daily   furosemide  80 mg Intravenous BID   heparin  5,000 Units Subcutaneous Q8H   hydroxychloroquine  200 mg Oral Daily   isosorbide-hydrALAZINE  1 tablet Oral TID   pantoprazole (PROTONIX) IV  40 mg Intravenous Q12H   predniSONE  5 mg Oral Q breakfast   rosuvastatin  5 mg Oral q1800   sodium bicarbonate  650 mg Oral TID with meals   sodium zirconium cyclosilicate  10 g Oral Daily   tacrolimus  0.5 mg Oral Daily   valGANciclovir  450 mg Oral QODAY   Continuous Infusions:  Diet Order             DIET DYS 3 Room service appropriate? Yes with Assist; Fluid consistency: Thin; Fluid restriction: 1200 mL Fluid  Diet effective now                    Weight change: -2 kg  Wt Readings from Last 3  Encounters:  12/10/20 92.4 kg  11/24/20 94.2 kg  11/11/20 92.9 kg     Consultants:see note  Procedures:see note  Antimicrobials:  Anti-infectives (From admission, onward)    Start     Dose/Rate Route Frequency Ordered Stop   12/08/20 1000  valGANciclovir (VALCYTE) 450 MG tablet TABS 450 mg        450 mg Oral Every other day 12/06/20 2350     12/07/20 1000  hydroxychloroquine (PLAQUENIL) tablet 200 mg       Note to Pharmacy: Recent Rx's are Plaquenil 200 mg once daily   200 mg Oral Daily 12/06/20 2350        Culture/Microbiology    Component Value Date/Time   SDES BLOOD SITE NOT SPECIFIED 12/07/2020 1408   SPECREQUEST  12/07/2020 1408    BOTTLES DRAWN AEROBIC ONLY Blood Culture results may not be optimal due to an excessive volume of blood received in culture bottles   CULT  12/07/2020 1408    NO GROWTH 3 DAYS Performed at [facility], [place] [address], [place], [place] [address]    REPTSTATUS PENDING 12/07/2020 1408  Other culture-see note  Unresulted Labs (From admission, onward)     Start     Ordered   12/10/20 0311  Pathologist smear review  Once,   R        12/10/20 0311   12/08/20 0956  Tacrolimus level  Once,   R       Question:  Specimen collection method  Answer:  Lab=Lab collect   12/08/20 0955   12/08/20 0500  CBC  Daily,   R      12/07/20 1242   12/08/20 6295  Basic metabolic panel  Daily,   R      12/07/20 1242           Data Reviewed: I have personally reviewed following labs and imaging studies  CBC: Recent Labs  Lab 12/06/20 2014 12/07/20 0500 12/07/20 0554 12/08/20 0242 12/09/20 0557 12/10/20 0311  WBC 3.4* 3.4*  --  3.9* 4.3 5.0  NEUTROABS 2.9 2.8  --   --   --   --   HGB 11.4* 11.2* 12.6 10.9* 11.5* 12.1  HCT 35.9* 35.0* 37.0 34.7* 37.0 39.1  MCV 91.1 91.1  --  91.8 93.9 92.7  PLT 74* 68*  --  64* 66* 69*    Basic Metabolic Panel: Recent Labs  Lab 12/06/20 2014 12/07/20 0500 12/07/20 0554 12/08/20 0242  12/09/20 0308 12/10/20 0545  NA 139 139 141 139 143 141  K 4.3 4.2 4.1 4.3 5.3* 4.5  CL 105 107  --  109 112* 108  CO2 18* 16*  --  16* 14* 17*  GLUCOSE 120* 102*  --  88 72 83  BUN 124* 127*  --  126* 136* 136*  CREATININE 5.01* 4.99*  --  4.95* 5.16* 5.42*  CALCIUM 9.7 9.7  --  9.6 9.8 10.0  MG  --  2.9*  --  3.1*  --   --     GFR: Estimated Creatinine Clearance: 12.1 mL/min (A) (by C-G formula based on SCr of 5.42 mg/dL (H)). Liver Function Tests: Recent Labs  Lab 12/06/20 2014 12/07/20 0500  AST 28 28  ALT 19 18  ALKPHOS 138* 130*  BILITOT 1.6* 1.5*  PROT 7.0 6.6  ALBUMIN 3.1* 3.0*    Recent Labs  Lab 12/06/20 2014  LIPASE 22    Recent Labs  Lab 12/08/20 0242  AMMONIA 28     Sepsis Labs: Recent Labs  Lab 12/07/20 0230  PROCALCITON 0.69     Recent Results (from the past 240 hour(s))  Resp Panel by RT-PCR (Flu A&B, Covid) Nasopharyngeal Swab     Status: None   Collection Time: 12/06/20  9:48 PM   Specimen: Nasopharyngeal Swab; Nasopharyngeal(NP) swabs in vial transport medium  Result Value Ref Range Status   SARS Coronavirus 2 by RT PCR NEGATIVE NEGATIVE Final    Comment: (NOTE) SARS-CoV-2 target nucleic acids are NOT DETECTED.  The SARS-CoV-2 RNA is generally detectable in upper respiratory specimens during the acute phase of infection. The lowest concentration of SARS-CoV-2 viral copies this assay can detect is 138 copies/mL. A negative result does not preclude SARS-Cov-2 infection and should not be used as the sole basis for treatment or other patient management decisions. A negative result may occur with  improper specimen collection/handling, submission of specimen other than nasopharyngeal swab, presence of viral mutation(s) within the areas targeted by this assay, and inadequate number of viral copies(<138 copies/mL). A negative result must be combined with clinical observations, patient history, and epidemiological information.  The  expected result is Negative.  Fact Sheet for Patients:  EntrepreneurPulse.com.au  Fact Sheet for Healthcare Providers:  IncredibleEmployment.be  This test is no t yet approved or cleared by the [place] FDA and  has been authorized for detection and/or diagnosis of SARS-CoV-2 by FDA under an Emergency Use Authorization (EUA). This EUA will remain  in effect (meaning this test can be used) for the duration of the COVID-19 declaration under Section 564(b)(1) of the Act, 21 U.S.C.section 360bbb-3(b)(1), unless the authorization is terminated  or revoked sooner.       Influenza A by PCR NEGATIVE NEGATIVE Final   Influenza B by PCR NEGATIVE NEGATIVE Final    Comment: (NOTE) The Xpert Xpress SARS-CoV-2/FLU/RSV plus assay is intended as an aid in the diagnosis of influenza from Nasopharyngeal swab specimens and should not be used as a sole basis for treatment. Nasal washings and aspirates are unacceptable for Xpert Xpress SARS-CoV-2/FLU/RSV testing.  Fact Sheet for Patients: EntrepreneurPulse.com.au  Fact Sheet for Healthcare Providers: IncredibleEmployment.be  This test is not yet approved or cleared by the [place] FDA and has been authorized for detection and/or diagnosis of SARS-CoV-2 by FDA under an Emergency Use Authorization (EUA). This EUA will remain in effect (meaning this test can be used) for the duration of the COVID-19 declaration under Section 564(b)(1) of the Act, 21 U.S.C. section 360bbb-3(b)(1), unless the authorization is terminated or revoked.  Performed at [facility], [place] [address], [place], [place] [address]   Culture, blood (routine x 2)     Status: None (Preliminary result)   Collection Time: 12/07/20  2:16 AM   Specimen: BLOOD RIGHT ARM  Result Value Ref Range Status   Specimen Description BLOOD RIGHT ARM  Final   Special Requests   Final    BOTTLES DRAWN  AEROBIC AND ANAEROBIC Blood Culture results may not be optimal due to an inadequate volume of blood received in culture bottles   Culture   Final    NO GROWTH 3 DAYS Performed at [facility], [place] [address] [place], [place] [address]    Report Status PENDING  Incomplete  Culture, blood (routine x 2)     Status: None (Preliminary result)   Collection Time: 12/07/20  2:08 PM   Specimen: BLOOD  Result Value Ref Range Status   Specimen Description BLOOD SITE NOT SPECIFIED  Final   Special Requests   Final    BOTTLES DRAWN AEROBIC ONLY Blood Culture results may not be optimal due to an excessive volume of blood received in culture bottles   Culture   Final    NO GROWTH 3 DAYS Performed at [facility], [place] [address], [place], [place] [address]    Report Status PENDING  Incomplete      Radiology Studies: EEG adult  Result Date: Dec 27, 2020 Lora Havens, MD     12-27-2020 12:51 PM Patient Name: AZAYLEA MAVES MRN: [id number] Epilepsy Attending: Lora Havens Referring Physician/Provider: Dr Dessa Phi Date: 27-Dec-2020 Duration: 23.18 mins Patient history: Level of alertness: Awake AEDs during EEG study: Technical aspects: This EEG study was done with scalp electrodes positioned according to the 10-20 International system of electrode placement. Electrical activity was acquired at a sampling rate of $Remov'500Hz'mGuCGm$  and reviewed with a high frequency filter of $RemoveB'70Hz'dkKtLuov$  and a low frequency filter of $RemoveB'1Hz'uOWZMpmJ$ . EEG data were recorded continuously and digitally stored. Description: EEG showed continued generalized lumbar every 3-5 hours theta-delta slowing.  Hyperventilation  and photic stimulation were not performed.   ABNORMALITY - Continuous slow, generalized IMPRESSION: This study is suggestive of moderate diffuse encephalopathy, nonspecific etiology. No seizures or epileptiform discharges were seen throughout the recording. Lora Havens     LOS: 3 days   Bonnielee Haff, MD [facility]  12/10/2020, 11:54 AM

## 2020-12-10 NOTE — Progress Notes (Signed)
Palliative:  HPI:  64 y.o. 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    I met today at [name]'s bedside along with daughter, Jenna [name]. Jenna [name] and I speak and she shares that she was able to have a good talk with her mother today. They were able to talk about heaven and Jenna [name] and [name] were able to share with each other what they needed to say. Jenna [name] shares that she has spent some time praying and after discussion with her mother and with unit director Gerald Stabs she is at peace with making decisions for DNR status. She knows that she does not wish for her mother to suffer and go through these measures. Jenna [name] expresses that her fear was making this decision that people would treat her mother differently and not continue to do all they can to help her to live as long as possible and I explain that this is not at all the case. We discussed that all DNR means is that we would not put her mother through painful measures when she is at the end of her life.   Jenna [name] very much wants to be with her mother at the end if possible. She would like their pastor present if she is unable to be there. 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 Jenna [name] was comforted by this knowledge. Jenna [name] continues to be worried about discharge and next steps. She is worried that  her mother will be discharged back to a facility that cannot care for her. We did discuss that with her mother's continued decline I worry about her even being able to leave the hospital. Jenna [name] expresses understanding.   All questions/concerns addressed. Emotional support provided.   Exam: Sleeping - responds to voice. Bilateral mittens (has pulled out IVs). Breathing regular, unlabored. Abd flat. BLE edema.   Plan: - DNR established. NO dialysis.  - Continue full scope care otherwise.  - Treat pain as needed.   25 min  Vinie Sill, NP Palliative Medicine Team Pager [phone] (Please see [email or web address] for schedule) Team Phone [phone]    Greater than 50%  of this time was spent counseling and coordinating care related to the above assessment and plan

## 2020-12-10 NOTE — Care Management Important Message (Signed)
Important Message  Patient Details  Name: Jenna [name] MRN: [id number] Date of Birth: 1955/07/28   Medicare Important Message Given:  Yes     Orbie Pyo 12/10/2020, 2:12 PM

## 2020-12-11 DIAGNOSIS — I5023 Acute on chronic systolic (congestive) heart failure: Secondary | ICD-10-CM | POA: Diagnosis not present

## 2020-12-11 DIAGNOSIS — N189 Chronic kidney disease, unspecified: Secondary | ICD-10-CM | POA: Diagnosis not present

## 2020-12-11 DIAGNOSIS — Z7189 Other specified counseling: Secondary | ICD-10-CM | POA: Diagnosis not present

## 2020-12-11 DIAGNOSIS — I132 Hypertensive heart and chronic kidney disease with heart failure and with stage 5 chronic kidney disease, or end stage renal disease: Secondary | ICD-10-CM | POA: Diagnosis not present

## 2020-12-11 DIAGNOSIS — Z515 Encounter for palliative care: Secondary | ICD-10-CM | POA: Diagnosis not present

## 2020-12-11 LAB — BASIC METABOLIC PANEL WITH GFR
Anion gap: 16 — ABNORMAL HIGH (ref 5–15)
BUN: 137 mg/dL — ABNORMAL HIGH (ref 8–23)
CO2: 16 mmol/L — ABNORMAL LOW (ref 22–32)
Calcium: 9.8 mg/dL (ref 8.9–10.3)
Chloride: 110 mmol/L (ref 98–111)
Creatinine, Ser: 5.63 mg/dL — ABNORMAL HIGH (ref 0.44–1.00)
GFR, Estimated: 8 mL/min — ABNORMAL LOW
Glucose, Bld: 76 mg/dL (ref 70–99)
Potassium: 4.1 mmol/L (ref 3.5–5.1)
Sodium: 142 mmol/L (ref 135–145)

## 2020-12-11 LAB — MAGNESIUM: Magnesium: 3 mg/dL — ABNORMAL HIGH (ref 1.7–2.4)

## 2020-12-11 MED ORDER — FUROSEMIDE 10 MG/ML IJ SOLN
80.0000 mg | Freq: Two times a day (BID) | INTRAMUSCULAR | Status: DC
  Administered 2020-12-12: 80 mg via INTRAVENOUS
  Filled 2020-12-11: qty 8

## 2020-12-11 MED ORDER — METOPROLOL TARTRATE 12.5 MG HALF TABLET
12.5000 mg | ORAL_TABLET | Freq: Two times a day (BID) | ORAL | Status: DC
  Administered 2020-12-11 – 2020-12-16 (×9): 12.5 mg via ORAL
  Filled 2020-12-11 (×13): qty 1

## 2020-12-11 NOTE — Progress Notes (Addendum)
Pt had a 16-beat-run of V-tach with no complaints of chest pain or shortness of breath. Alcario Drought, MD made aware see new orders; will continue to monitor.   Elaina Hoops, RN

## 2020-12-11 NOTE — Progress Notes (Signed)
Palliative:  HPI:  65 y.o. 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     [name] is resting comfortably. She continues to have mittens on her hands. She speaks to me briefly but does not open eyes. She denies pain. I ask if she is hungry and if she would like to try some of her lunch and she tells me "no." I ask her if she would like to try a little later and she nods yes. I left her to rest.   I called and spoke with [name]. I shared with [name] my interaction with her mother and per NT she ate just a couple bites of breakfast. We discussed rising creatinine. We also discussed NSVT run and restarting metoprolol. [name] plans to visit again this evening after her work hours. She has full understanding of her mother's prognosis. She seems to have more peace regarding the situation although still hopeful to have as much time together as possible.   All questions/concerns addressed. Emotional support provided.   Exam: Sleeping. Awakens to voice. No distress. Breathing regular, unlabored. Abd soft. BLE edema.   Plan: - DNR established.  - Continue full scope outside DNR and NO dialysis.  - Treat pain as needed.   25 min  Vinie Sill, NP Palliative Medicine Team Pager [phone] (Please see [email or web address] for schedule) Team Phone [phone]    Greater than 50%  of this time was spent counseling and coordinating care related to the above assessment and plan

## 2020-12-11 NOTE — Progress Notes (Signed)
Pt's magnesium level resulted at 3.0; Alcario Drought, MD made aware. Awaiting new orders. Will continue to monitor.   Elaina Hoops, RN

## 2020-12-11 NOTE — Progress Notes (Addendum)
PROGRESS NOTE    Jenna [name]  [name] DOB: [birth year] DOA: 12/06/2020 PCP: [name], Jenna G, MD    Brief Narrative:  Jenna [name], 65 y.o. 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  Was recently hospitalized for congestive heart failure.  Subsequently discharged to skilled nursing facility.  Rehospitalized due to worsening lethargy.   Subjective: Patient remains lethargic.  Denies any chest pain shortness of breath currently.     Assessment & Plan:  Progressive cardiorenal failure Acute on chronic systolic heart failure: Repeat echo EF 25-30%, grade 3 DD, moderately elevated pulmonary artery systolic pressure. Previous EF 20-25% in July. She is followed by cardiology at [facility] / Dr. Lamonte [name]. Chest x-ray this admission with persistent pulmonary edema, bilateral pleural effusion and infiltrates with fluid overload.  Has been poorly responsive to torsemide previously with significant rise in BUN.   Remains on intravenous furosemide.  Ins and outs are not being charted accurately because of incontinence issues.  Weight is about the same.   Patient's creatinine continues to worsen.  BUN is significantly elevated as well.  Hemodialysis is not an option based on discussions between nephrology and patient and patient's previously known wishes.  Prognosis is poor at this time.   No ACE inhibitor's or ARB due to chronic kidney disease.  Had been on beta-blockers previously which had to be held during last admission due to  hypotension.  Patient now with episodes of nonsustained ventricular tachycardia.  Will initiate low-dose beta-blocker since blood pressure seems to be adequate at this time.  Chest pain CAD/ischemic cardiomyopathy: Troponin has been flat 184 >> 183 >> 179, due to CKD.  Not a candidate for cardiac cath considering her advanced tenuous renal function, repeat echo EF 25-30% global hypokinesia.   She is on aspirin and Crestor.  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 Johnney [name]. Baseline creatinine around 5.  She is on Prograf/prednisone.   Patient has made her wishes known that she does not wish to resume dialysis and she understands the consequence of the decision. Nephrology was consulted to further address this issue.  Appreciate their assistance.  No plans to initiate dialysis at this time based on their discussion with the patient. Potassium level is better today.  Potassium level has improved. Creatinine continues to climb.  Hold Lasix for today.  Recheck labs tomorrow.    Anemia of chronic renal disease/thrombocytopenia Hemoglobin is stable.  No evidence of overt blood loss. Platelet counts have been low at least since August.  Stable.  No evidence for bleeding.  Etiology is unclear.  Acute encephalopathy/uremic encephalopathy:  Mentation seems to be stable. CT head negative on admission. EEG without seizure activity.  Ammonia normal.   Continue supportive care delirium precaution avoid psychotropic medication  Paroxysmal atrial fibrillation Monitor on telemetry.  Not noted to be on anticoagulation.  Not on any rate limiting drugs either.    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 Continue outpatient regimen of valganciclovir  Goals of care:  Palliative care continues  to follow.  Have been communicating with patient's daughter.   Transition to DNR yesterday.  Prognosis remains poor.  Class I Obesity:Patient's Body mass index is 32.44 kg/m. :   DVT prophylaxis: Subcutaneous heparin Code Status: DNR Family Communication: plan of care discussed with patient at bedside. Disposition: Unclear  Status is: Inpatient  Remains inpatient appropriate because: For ongoing management of complex medical comorbidities, ongoing palliative care discussion   Objective: Vitals last 24 hrs: Vitals:   12/10/20 0635 12/10/20 1314 12/10/20 2020 12/11/20 0430  BP: (!) 151/96 132/78 130/70 130/81  Pulse: 83 79 80 86  Resp: $Remo'18 18 19 20  'bYwxd$ Temp: 98.2 F (36.8 C) 98.2 F (36.8 C) (!) 97 F (36.1 C) (!) 97.3 F (36.3 C)  TempSrc: Axillary Axillary Axillary Oral  SpO2: 96%  98% 98%  Weight: 92.4 kg   93.9 kg  Height:       Weight change: 1.54 kg No intake or output data in the 24 hours ending 12/11/20 1028  Net IO Since Admission: -515 mL [12/11/20 1028]   Physical Examination:  General appearance: Lethargic but easily arousable.  In no distress Resp: Diminished air entry at the bases.  No wheezing or rhonchi Cardio: S1-S2 is normal regular.  No S3-S4.  No rubs murmurs or bruit GI: Abdomen is soft.  Nontender nondistended.  Bowel sounds are present normal.  No masses organomegaly Extremities: Edema bilateral lower extremities. Neurologic:  No focal neurological deficits.      Medications reviewed:  Scheduled Meds:  aspirin EC  81 mg Oral q morning   citalopram  10 mg Oral Daily   furosemide  80 mg Intravenous BID   heparin  5,000 Units Subcutaneous Q8H   hydroxychloroquine  200 mg Oral Daily   isosorbide-hydrALAZINE  1 tablet Oral TID   pantoprazole (PROTONIX) IV  40 mg Intravenous Q12H   predniSONE  5 mg Oral Q breakfast   rosuvastatin  5 mg Oral q1800   sodium bicarbonate  650 mg Oral TID with meals   tacrolimus  0.5 mg Oral Daily   valGANciclovir  450 mg Oral QODAY   Continuous Infusions:  Diet Order              DIET DYS 3 Room service appropriate? Yes with Assist; Fluid consistency: Thin; Fluid restriction: 1200 mL Fluid  Diet effective now                    Weight change: 1.54 kg  Wt Readings from Last 3 Encounters:  12/11/20 93.9 kg  11/24/20 94.2 kg  11/11/20 92.9 kg     Consultants:see note  Procedures:see note  Antimicrobials:  Anti-infectives (From admission, onward)    Start     Dose/Rate Route Frequency Ordered Stop   12/08/20 1000  valGANciclovir (VALCYTE) 450 MG tablet TABS 450 mg        450 mg Oral Every other day 12/06/20 2350     12/07/20 1000  hydroxychloroquine (PLAQUENIL) tablet 200 mg       Note to Pharmacy: Recent Rx's are Plaquenil 200 mg once daily   200 mg Oral Daily 12/06/20 2350        Culture/Microbiology    Component Value Date/Time   SDES BLOOD SITE NOT SPECIFIED 12/07/2020 1408   SPECREQUEST  12/07/2020 1408    BOTTLES DRAWN AEROBIC ONLY Blood Culture results may not be optimal due to an excessive volume of blood received in culture bottles   CULT  12/07/2020 1408  NO GROWTH 4 DAYS Performed at [facility], [place] [address] [place], [place] [address]    REPTSTATUS PENDING 12/07/2020 1408    Other culture-see note  Unresulted Labs (From admission, onward)     Start     Ordered   12/08/20 1245  Basic metabolic panel  Daily,   R      12/07/20 1242           Data Reviewed: I have personally reviewed following labs and imaging studies  CBC: Recent Labs  Lab 12/06/20 2014 12/07/20 0500 12/07/20 0554 12/08/20 0242 12/09/20 0557 12/10/20 0311  WBC 3.4* 3.4*  --  3.9* 4.3 5.0  NEUTROABS 2.9 2.8  --   --   --   --   HGB 11.4* 11.2* 12.6 10.9* 11.5* 12.1  HCT 35.9* 35.0* 37.0 34.7* 37.0 39.1  MCV 91.1 91.1  --  91.8 93.9 92.7  PLT 74* 68*  --  64* 66* 69*    Basic Metabolic Panel: Recent Labs  Lab 12/07/20 0500 12/07/20 0554 12/08/20 0242 12/09/20 0308 12/10/20 0545 12/11/20 0301  NA 139 141 139 143  141 142  K 4.2 4.1 4.3 5.3* 4.5 4.1  CL 107  --  109 112* 108 110  CO2 16*  --  16* 14* 17* 16*  GLUCOSE 102*  --  88 72 83 76  BUN 127*  --  126* 136* 136* 137*  CREATININE 4.99*  --  4.95* 5.16* 5.42* 5.63*  CALCIUM 9.7  --  9.6 9.8 10.0 9.8  MG 2.9*  --  3.1*  --   --  3.0*    GFR: Estimated Creatinine Clearance: 11.7 mL/min (A) (by C-G formula based on SCr of 5.63 mg/dL (H)). Liver Function Tests: Recent Labs  Lab 12/06/20 2014 12/07/20 0500  AST 28 28  ALT 19 18  ALKPHOS 138* 130*  BILITOT 1.6* 1.5*  PROT 7.0 6.6  ALBUMIN 3.1* 3.0*    Recent Labs  Lab 12/06/20 2014  LIPASE 22    Recent Labs  Lab 12/08/20 0242  AMMONIA 28     Sepsis Labs: Recent Labs  Lab 12/07/20 0230  PROCALCITON 0.69     Recent Results (from the past 240 hour(s))  Resp Panel by RT-PCR (Flu A&B, Covid) Nasopharyngeal Swab     Status: None   Collection Time: 12/06/20  9:48 PM   Specimen: Nasopharyngeal Swab; Nasopharyngeal(NP) swabs in vial transport medium  Result Value Ref Range Status   SARS Coronavirus 2 by RT PCR NEGATIVE NEGATIVE Final    Comment: (NOTE) SARS-CoV-2 target nucleic acids are NOT DETECTED.  The SARS-CoV-2 RNA is generally detectable in upper respiratory specimens during the acute phase of infection. The lowest concentration of SARS-CoV-2 viral copies this assay can detect is 138 copies/mL. A negative result does not preclude SARS-Cov-2 infection and should not be used as the sole basis for treatment or other patient management decisions. A negative result may occur with  improper specimen collection/handling, submission of specimen other than nasopharyngeal swab, presence of viral mutation(s) within the areas targeted by this assay, and inadequate number of viral copies(<138 copies/mL). A negative result must be combined with clinical observations, patient history, and epidemiological information. The expected result is Negative.  Fact Sheet for Patients:   EntrepreneurPulse.com.au  Fact Sheet for Healthcare Providers:  IncredibleEmployment.be  This test is no t yet approved or cleared by the [place] FDA and  has been authorized for detection and/or diagnosis of SARS-CoV-2  by FDA under an Emergency Use Authorization (EUA). This EUA will remain  in effect (meaning this test can be used) for the duration of the COVID-19 declaration under Section 564(b)(1) of the Act, 21 U.S.C.section 360bbb-3(b)(1), unless the authorization is terminated  or revoked sooner.       Influenza A by PCR NEGATIVE NEGATIVE Final   Influenza B by PCR NEGATIVE NEGATIVE Final    Comment: (NOTE) The Xpert Xpress SARS-CoV-2/FLU/RSV plus assay is intended as an aid in the diagnosis of influenza from Nasopharyngeal swab specimens and should not be used as a sole basis for treatment. Nasal washings and aspirates are unacceptable for Xpert Xpress SARS-CoV-2/FLU/RSV testing.  Fact Sheet for Patients: EntrepreneurPulse.com.au  Fact Sheet for Healthcare Providers: IncredibleEmployment.be  This test is not yet approved or cleared by the [place] FDA and has been authorized for detection and/or diagnosis of SARS-CoV-2 by FDA under an Emergency Use Authorization (EUA). This EUA will remain in effect (meaning this test can be used) for the duration of the COVID-19 declaration under Section 564(b)(1) of the Act, 21 U.S.C. section 360bbb-3(b)(1), unless the authorization is terminated or revoked.  Performed at [facility], [place] [address], [place], [place] [address]   Culture, blood (routine x 2)     Status: None (Preliminary result)   Collection Time: 12/07/20  2:16 AM   Specimen: BLOOD RIGHT ARM  Result Value Ref Range Status   Specimen Description BLOOD RIGHT ARM  Final   Special Requests   Final    BOTTLES DRAWN AEROBIC AND ANAEROBIC Blood Culture results may not be optimal  due to an inadequate volume of blood received in culture bottles   Culture   Final    NO GROWTH 4 DAYS Performed at [facility], [place] [address], [place], [place] [address]    Report Status PENDING  Incomplete  Culture, blood (routine x 2)     Status: None (Preliminary result)   Collection Time: 12/07/20  2:08 PM   Specimen: BLOOD  Result Value Ref Range Status   Specimen Description BLOOD SITE NOT SPECIFIED  Final   Special Requests   Final    BOTTLES DRAWN AEROBIC ONLY Blood Culture results may not be optimal due to an excessive volume of blood received in culture bottles   Culture   Final    NO GROWTH 4 DAYS Performed at [facility], [place] [address], [place], [place] [address]    Report Status PENDING  Incomplete      Radiology Studies: No results found.   LOS: 4 days   Bonnielee Haff, MD [facility]  12/11/2020, 10:28 AM

## 2020-12-12 DIAGNOSIS — I5023 Acute on chronic systolic (congestive) heart failure: Secondary | ICD-10-CM | POA: Diagnosis not present

## 2020-12-12 DIAGNOSIS — I501 Left ventricular failure: Secondary | ICD-10-CM | POA: Diagnosis not present

## 2020-12-12 DIAGNOSIS — G9340 Encephalopathy, unspecified: Secondary | ICD-10-CM | POA: Diagnosis not present

## 2020-12-12 DIAGNOSIS — N189 Chronic kidney disease, unspecified: Secondary | ICD-10-CM | POA: Diagnosis not present

## 2020-12-12 DIAGNOSIS — Z7189 Other specified counseling: Secondary | ICD-10-CM | POA: Diagnosis not present

## 2020-12-12 DIAGNOSIS — I132 Hypertensive heart and chronic kidney disease with heart failure and with stage 5 chronic kidney disease, or end stage renal disease: Secondary | ICD-10-CM | POA: Diagnosis not present

## 2020-12-12 DIAGNOSIS — N185 Chronic kidney disease, stage 5: Secondary | ICD-10-CM | POA: Diagnosis not present

## 2020-12-12 LAB — CULTURE, BLOOD (ROUTINE X 2)
Culture: NO GROWTH
Culture: NO GROWTH

## 2020-12-12 LAB — BASIC METABOLIC PANEL
Anion gap: 16 — ABNORMAL HIGH (ref 5–15)
BUN: 144 mg/dL — ABNORMAL HIGH (ref 8–23)
CO2: 15 mmol/L — ABNORMAL LOW (ref 22–32)
Calcium: 9.5 mg/dL (ref 8.9–10.3)
Chloride: 112 mmol/L — ABNORMAL HIGH (ref 98–111)
Creatinine, Ser: 5.95 mg/dL — ABNORMAL HIGH (ref 0.44–1.00)
GFR, Estimated: 7 mL/min — ABNORMAL LOW (ref >60)
Glucose, Bld: 81 mg/dL (ref 70–99)
Potassium: 4.7 mmol/L (ref 3.5–5.1)
Sodium: 143 mmol/L (ref 135–145)

## 2020-12-12 MED ORDER — SODIUM BICARBONATE 650 MG PO TABS
1300.0000 mg | ORAL_TABLET | Freq: Three times a day (TID) | ORAL | Status: DC
  Administered 2020-12-12 – 2020-12-17 (×5): 1300 mg via ORAL
  Filled 2020-12-12 (×7): qty 2

## 2020-12-12 NOTE — Progress Notes (Signed)
Palliative:  HPI:  65 y.o. 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     Subjective: [name] is resting comfortably. She shakes her head "no" when asked if she is in pain or distress. She shakes her head "yes" when asked if she is comfortable and if this PA can call her daughter to provide support while she continues to rest.  I then called patient's daughter Jenna [name]. We discussed the impact of recent DNR decision on her and her mother. She is in need of some time and space for emotional processing of [name]'s poor prognosis, as well as to continue making logistical arrangements for her family. Discussed patient's current condition and interval history since our last conversation. She understands clearly and voices her appreciation of the care team.     All questions/concerns addressed. PMT will continue to support holistically.  Exam: Frail elderly AA F. Sleeping. Awakens to voice. Breathing regular, unlabored. Abd soft. BLE edema.  Vital signs and nursing notes reviewed.  Plan: - DNR, NO dialysis - Continue full scope treatment otherwise - Patient's daughter is appreciative of daily updates; she requests to be clearly informed of whether patient is stable or acutely decompensating at the beginning of any phone calls - Psychosocial and emotional support provided - Ongoing support from PMT  Total time: 15 min Greater than 50% of this time was spent in counseling and coordinating care related to the above assessment and  plan.  Dorthy Cooler, PA-C Palliative Medicine Team Team phone # [phone]  Thank you for allowing the Palliative Medicine Team to assist in the care of this patient. Please utilize secure chat with additional questions, if there is no response within 30 minutes please call the above phone number.  Palliative Medicine Team providers are available by phone from 7am to 7pm daily and can be reached through the team cell phone.  Should this patient require assistance outside of these hours, please call the patient's attending physician.

## 2020-12-12 NOTE — Progress Notes (Signed)
Pt has been refusing multiple doses of subcutaneous Heparin. Pt educated on DVT prophylaxis treatment. Will continue to monitor.   Elaina Hoops, RN

## 2020-12-12 NOTE — Progress Notes (Signed)
PROGRESS NOTE    Jenna [name]  KYH:[id number] DOB: [birth year] DOA: 12/06/2020 PCP: [name], Betty G, MD    Brief Narrative:  Jenna [name], 65 y.o. 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  Was recently hospitalized for congestive heart failure.  Subsequently discharged to skilled nursing facility.  Rehospitalized due to worsening lethargy.   Subjective: Patient noted to be awake today.  Not very communicative.  Denies any chest pain.  No shortness of breath.   Assessment & Plan:  Progressive cardiorenal failure/Acute on chronic systolic heart failure: Repeat echo EF 25-30%, grade 3 DD, moderately elevated pulmonary artery systolic pressure. Previous EF 20-25% in July. She is followed by cardiology at [facility] / Jenna [name]. Chest x-ray this admission with persistent pulmonary edema, bilateral pleural effusion and infiltrates with fluid overload.  Has been poorly responsive to torsemide previously with significant rise in BUN.   Remains on intravenous furosemide.  Ins and outs are not being charted accurately because of incontinence issues.  Weight has been about the same when reviewed over the last month or so. Patient's creatinine continues to worsen.  BUN is significantly elevated as well.  Hemodialysis is not an option based on discussions between nephrology and patient and patient's previously known wishes.  Prognosis is poor at this time.   No ACE inhibitor's or ARB due to chronic kidney disease.   Had been on beta-blockers  previously which had to be held during last admission due to hypotension.  Patient noted to have episodes of nonsustained ventricular tachycardia.  Started on low-dose beta-blocker with improvement on telemetry.  Continue to monitor.  Blood pressure seems to be stable.    CAD/ischemic cardiomyopathy: Troponin has been flat 184 >> 183 >> 179, due to CKD.  Not a candidate for cardiac cath considering her advanced tenuous renal function, repeat echo EF 25-30% global hypokinesia.   She is on aspirin and Crestor.  Pain-free currently.  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 Baseline creatinine around 5.  She is on Prograf/prednisone.   Patient has made her wishes known that she does not wish to resume dialysis and she understands the consequence of the decision. Nephrology was consulted to further address this issue.  Appreciate their assistance.  No plans to initiate dialysis at this time based on their discussion with the patient. Lasix was held yesterday to see if there would be any improvement in her renal function but her BUN and creatinine continue to rise.  Recheck labs tomorrow.  Hold her p.m. dose of Lasix. Electrolytes reviewed.  Potassium is normal.    Anemia of chronic renal disease/thrombocytopenia Hemoglobin is stable.  No evidence of overt blood loss. Platelet counts have been low at least since August.  Stable.  No evidence for bleeding.  Etiology is unclear.  Recheck tomorrow.  Acute encephalopathy/uremic encephalopathy:  Mentation seems to be stable. CT head negative on admission. EEG without seizure activity.  Ammonia normal.  Continue supportive care delirium precaution avoid psychotropic medication  Paroxysmal atrial fibrillation Monitor on telemetry.  Not noted to  be on anticoagulation.  On low-dose beta-blocker.  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 Continue outpatient regimen of valganciclovir  Goals of care:  Palliative care continues to follow.  Have been communicating with patient's daughter.  Transitioned to DNR.  Prognosis remains poor.  Class I Obesity:Patient's Body mass index is 32.22 kg/m. :   DVT prophylaxis: Subcutaneous heparin Code Status: DNR Family Communication: No family at bedside.  Will call daughter today. Disposition: Unclear  Status is: Inpatient  Remains inpatient appropriate because: For ongoing management of complex medical comorbidities, ongoing palliative care discussion   Objective: Vitals last 24 hrs: Vitals:   12/11/20 1400 12/11/20 1953 12/12/20 0521 12/12/20 0940  BP: 115/70 108/65 117/71 118/67  Pulse: 73 70 68 70  Resp: $Remo'16 18 18 16  'Lezsb$ Temp: (!) 97.5 F (36.4 C) (!) 97.4 F (36.3 C) 98.1 F (36.7 C) (!) 97.4 F (36.3 C)  TempSrc: Axillary Axillary Oral Oral  SpO2:  94% 95% 93%  Weight:   93.3 kg   Height:       Weight change: -0.64 kg  Intake/Output Summary (Last 24 hours) at 12/12/2020 1022 Last data filed at 12/12/2020 0525 Gross per 24 hour  Intake --  Output 375 ml  Net -375 ml    Net IO Since Admission: -890 mL [12/12/20 1022]    Physical Examination:  General appearance: Awake alert.  In no distress.  Not very communicative. Resp: Normal effort at rest.  Diminished air entry at the bases.  No wheezing or rhonchi.  Few crackles. Cardio: S1-S2 is normal regular.  No S3-S4.  Systolic murmur over the precordium GI: Abdomen is soft.  Nontender nondistended.  Bowel sounds are present normal.  No masses organomegaly Extremities: Stable edema bilateral lower extremities Neurologic:   No focal neurological deficits.     Medications reviewed:  Scheduled Meds:  aspirin EC  81 mg Oral q morning   citalopram  10 mg Oral Daily   furosemide  80 mg Intravenous BID   heparin  5,000 Units Subcutaneous  Q8H   hydroxychloroquine  200 mg Oral Daily   isosorbide-hydrALAZINE  1 tablet Oral TID   metoprolol tartrate  12.5 mg Oral BID   pantoprazole (PROTONIX) IV  40 mg Intravenous Q12H   predniSONE  5 mg Oral Q breakfast   rosuvastatin  5 mg Oral q1800   sodium bicarbonate  650 mg Oral TID with meals   tacrolimus  0.5 mg Oral Daily   valGANciclovir  450 mg Oral QODAY   Continuous Infusions:  Diet Order             DIET DYS 3 Room service appropriate? Yes with Assist; Fluid consistency: Thin; Fluid restriction: 1200 mL Fluid  Diet effective now                    Weight change: -0.64 kg  Wt Readings from Last 3 Encounters:  12/12/20 93.3 kg  11/24/20 94.2 kg  11/11/20 92.9 kg     Consultants:see note  Procedures:see note  Antimicrobials:  Anti-infectives (From admission, onward)    Start     Dose/Rate Route Frequency Ordered Stop   12/08/20 1000  valGANciclovir (VALCYTE) 450 MG tablet TABS 450 mg        450 mg Oral Every other day 12/06/20 2350     12/07/20 1000  hydroxychloroquine (PLAQUENIL) tablet 200 mg       Note to Pharmacy:  Recent Rx's are Plaquenil 200 mg once daily   200 mg Oral Daily 12/06/20 2350        Culture/Microbiology    Component Value Date/Time   SDES BLOOD SITE NOT SPECIFIED 12/07/2020 1408   SPECREQUEST  12/07/2020 1408    BOTTLES DRAWN AEROBIC ONLY Blood Culture results may not be optimal due to an excessive volume of blood received in culture bottles   CULT  12/07/2020 1408    NO GROWTH 4 DAYS Performed at [facility], [place] [address], [place], [place] [address]    REPTSTATUS PENDING 12/07/2020 1408    Other culture-see note  Unresulted Labs (From admission, onward)    None      Data Reviewed: I have personally reviewed following labs and imaging studies  CBC: Recent Labs  Lab 12/06/20 2014 12/07/20 0500 12/07/20 0554 12/08/20 0242 12/09/20 0557 12/10/20 0311  WBC 3.4* 3.4*  --  3.9* 4.3 5.0  NEUTROABS 2.9 2.8  --    --   --   --   HGB 11.4* 11.2* 12.6 10.9* 11.5* 12.1  HCT 35.9* 35.0* 37.0 34.7* 37.0 39.1  MCV 91.1 91.1  --  91.8 93.9 92.7  PLT 74* 68*  --  64* Jenna* 69*    Basic Metabolic Panel: Recent Labs  Lab 12/07/20 0500 12/07/20 0554 12/08/20 0242 12/09/20 0308 12/10/20 0545 12/11/20 0301 12/12/20 0336  NA 139   < > 139 143 141 142 143  K 4.2   < > 4.3 5.3* 4.5 4.1 4.7  CL 107  --  109 112* 108 110 112*  CO2 16*  --  16* 14* 17* 16* 15*  GLUCOSE 102*  --  88 72 83 76 81  BUN 127*  --  126* 136* 136* 137* 144*  CREATININE 4.99*  --  4.95* 5.16* 5.42* 5.63* 5.95*  CALCIUM 9.7  --  9.6 9.8 10.0 9.8 9.5  MG 2.9*  --  3.1*  --   --  3.0*  --    < > = values in this interval not displayed.    GFR: Estimated Creatinine Clearance: 11.1 mL/min (A) (by C-[name] formula based on SCr of 5.95 mg/dL (H)). Liver Function Tests: Recent Labs  Lab 12/06/20 2014 12/07/20 0500  AST 28 28  ALT 19 18  ALKPHOS 138* 130*  BILITOT 1.6* 1.5*  PROT 7.0 6.6  ALBUMIN 3.1* 3.0*    Recent Labs  Lab 12/06/20 2014  LIPASE 22    Recent Labs  Lab 12/08/20 0242  AMMONIA 28     Sepsis Labs: Recent Labs  Lab 12/07/20 0230  PROCALCITON 0.69     Recent Results (from the past 240 hour(s))  Resp Panel by RT-PCR (Flu A&B, Covid) Nasopharyngeal Swab     Status: None   Collection Time: 12/06/20  9:48 PM   Specimen: Nasopharyngeal Swab; Nasopharyngeal(NP) swabs in vial transport medium  Result Value Ref Range Status   SARS Coronavirus 2 by RT PCR NEGATIVE NEGATIVE Final    Comment: (NOTE) SARS-CoV-2 target nucleic acids are NOT DETECTED.  The SARS-CoV-2 RNA is generally detectable in upper respiratory specimens during the acute phase of infection. The lowest concentration of SARS-CoV-2 viral copies this assay can detect is 138 copies/mL. A negative result does not preclude SARS-Cov-2 infection and should not be used as the sole basis for treatment or other patient management decisions. A  negative result may occur with  improper specimen collection/handling, submission of specimen other than nasopharyngeal swab,  presence of viral mutation(s) within the areas targeted by this assay, and inadequate number of viral copies(<138 copies/mL). A negative result must be combined with clinical observations, patient history, and epidemiological information. The expected result is Negative.  Fact Sheet for Patients:  EntrepreneurPulse.com.au  Fact Sheet for Healthcare Providers:  IncredibleEmployment.be  This test is no t yet approved or cleared by the [place] FDA and  has been authorized for detection and/or diagnosis of SARS-CoV-2 by FDA under an Emergency Use Authorization (EUA). This EUA will remain  in effect (meaning this test can be used) for the duration of the COVID-19 declaration under Section 564(b)(1) of the Act, 21 U.S.C.section 360bbb-3(b)(1), unless the authorization is terminated  or revoked sooner.       Influenza A by PCR NEGATIVE NEGATIVE Final   Influenza B by PCR NEGATIVE NEGATIVE Final    Comment: (NOTE) The Xpert Xpress SARS-CoV-2/FLU/RSV plus assay is intended as an aid in the diagnosis of influenza from Nasopharyngeal swab specimens and should not be used as a sole basis for treatment. Nasal washings and aspirates are unacceptable for Xpert Xpress SARS-CoV-2/FLU/RSV testing.  Fact Sheet for Patients: EntrepreneurPulse.com.au  Fact Sheet for Healthcare Providers: IncredibleEmployment.be  This test is not yet approved or cleared by the [place] FDA and has been authorized for detection and/or diagnosis of SARS-CoV-2 by FDA under an Emergency Use Authorization (EUA). This EUA will remain in effect (meaning this test can be used) for the duration of the COVID-19 declaration under Section 564(b)(1) of the Act, 21 U.S.C. section 360bbb-3(b)(1), unless the authorization  is terminated or revoked.  Performed at [facility], [place] [address], [place], [place] [address]   Culture, blood (routine x 2)     Status: None (Preliminary result)   Collection Time: 12/07/20  2:16 AM   Specimen: BLOOD RIGHT ARM  Result Value Ref Range Status   Specimen Description BLOOD RIGHT ARM  Final   Special Requests   Final    BOTTLES DRAWN AEROBIC AND ANAEROBIC Blood Culture results may not be optimal due to an inadequate volume of blood received in culture bottles   Culture   Final    NO GROWTH 4 DAYS Performed at [facility], [place] [address], [place], [place] [address]    Report Status PENDING  Incomplete  Culture, blood (routine x 2)     Status: None (Preliminary result)   Collection Time: 12/07/20  2:08 PM   Specimen: BLOOD  Result Value Ref Range Status   Specimen Description BLOOD SITE NOT SPECIFIED  Final   Special Requests   Final    BOTTLES DRAWN AEROBIC ONLY Blood Culture results may not be optimal due to an excessive volume of blood received in culture bottles   Culture   Final    NO GROWTH 4 DAYS Performed at [facility], [place] [address], [place], [place] [address]    Report Status PENDING  Incomplete      Radiology Studies: No results found.   LOS: 5 days   Bonnielee Haff, MD [facility]  12/12/2020, 10:22 AM

## 2020-12-13 DIAGNOSIS — N189 Chronic kidney disease, unspecified: Secondary | ICD-10-CM | POA: Diagnosis not present

## 2020-12-13 DIAGNOSIS — I5023 Acute on chronic systolic (congestive) heart failure: Secondary | ICD-10-CM | POA: Diagnosis not present

## 2020-12-13 DIAGNOSIS — G9340 Encephalopathy, unspecified: Secondary | ICD-10-CM | POA: Diagnosis not present

## 2020-12-13 DIAGNOSIS — G9349 Other encephalopathy: Secondary | ICD-10-CM | POA: Diagnosis not present

## 2020-12-13 DIAGNOSIS — I132 Hypertensive heart and chronic kidney disease with heart failure and with stage 5 chronic kidney disease, or end stage renal disease: Secondary | ICD-10-CM | POA: Diagnosis not present

## 2020-12-13 LAB — BASIC METABOLIC PANEL
Anion gap: 14 (ref 5–15)
BUN: 142 mg/dL — ABNORMAL HIGH (ref 8–23)
CO2: 18 mmol/L — ABNORMAL LOW (ref 22–32)
Calcium: 9.4 mg/dL (ref 8.9–10.3)
Chloride: 111 mmol/L (ref 98–111)
Creatinine, Ser: 5.96 mg/dL — ABNORMAL HIGH (ref 0.44–1.00)
GFR, Estimated: 7 mL/min — ABNORMAL LOW (ref >60)
Glucose, Bld: 103 mg/dL — ABNORMAL HIGH (ref 70–99)
Potassium: 3.9 mmol/L (ref 3.5–5.1)
Sodium: 143 mmol/L (ref 135–145)

## 2020-12-13 LAB — CBC
HCT: 32.5 % — ABNORMAL LOW (ref 36.0–46.0)
Hemoglobin: 10.6 g/dL — ABNORMAL LOW (ref 12.0–15.0)
MCH: 29.3 pg (ref 26.0–34.0)
MCHC: 32.6 g/dL (ref 30.0–36.0)
MCV: 89.8 fL (ref 80.0–100.0)
Platelets: 64 10*3/uL — ABNORMAL LOW (ref 150–400)
RBC: 3.62 MIL/uL — ABNORMAL LOW (ref 3.87–5.11)
RDW: 21.2 % — ABNORMAL HIGH (ref 11.5–15.5)
WBC: 4 10*3/uL (ref 4.0–10.5)
nRBC: 3.8 % — ABNORMAL HIGH (ref 0.0–0.2)

## 2020-12-13 NOTE — Progress Notes (Signed)
Palliative:  HPI:  65 y.o. 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     Subjective: Medical records reviewed. Patient assessed at the bedside and is more somnolent today. Does not respond to my questions but moans at times. Patient's daughter Vickii Chafe is present at the bedside.  We discussed today's labs and patient's fluctuating energy levels. [name] remains cautiously hopeful for more time with her mother and understands that anything can happen at any time. She is looking forward to a family FaceTime video with several of Rashel's relatives, including her favorite sister.    All questions/concerns addressed. PMT will continue to support holistically.  Exam: Frail elderly AA F. Sleeping. Awakens to voice but does not respond. Breathing regular, unlabored. Abd soft. BLE edema.  Vital signs and nursing notes reviewed.  Plan: - Continue current care - Psychosocial and emotional support provided - Ongoing support from PMT  Total time: 15 min Greater than 50% of this time was spent in counseling and coordinating care related to the above assessment and plan.  Dorthy Cooler, PA-C Palliative Medicine Team Team phone # [phone]  Thank you for allowing the Palliative Medicine Team to assist in the care of this patient. Please utilize secure chat with additional questions, if there is no response within 30 minutes please call the above phone number.  Palliative Medicine Team providers are available by phone from 7am to 7pm daily  and can be reached through the team cell phone.  Should this patient require assistance outside of these hours, please call the patient's attending physician.

## 2020-12-13 NOTE — Progress Notes (Signed)
PROGRESS NOTE    Jenna [name]  HUW:[id number] DOB: 05/11/1955 DOA: 12/06/2020 PCP: [place], Betty G, MD    Brief Narrative:  Jenna [name], 65 y.o. 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  Was recently hospitalized for congestive heart failure.  Subsequently discharged to skilled nursing facility.  Rehospitalized due to worsening lethargy.   Subjective: Patient again noted to be lethargic.  Easily arousable.  Denies any chest pain or shortness of breath.  Moaning occasionally but once again denies any pain.  Not very communicative.  Assessment & Plan:  Progressive cardiorenal failure/Acute on chronic systolic heart failure: Repeat echo EF 25-30%, grade 3 DD, moderately elevated pulmonary artery systolic pressure. Previous EF 20-25% in July. She is followed by cardiology at [facility] / Dr. Delton Coombes. Chest x-ray this admission with persistent pulmonary edema, bilateral pleural effusion and infiltrates with fluid overload.  Has been poorly responsive to torsemide previously with significant rise in BUN.   Was on intravenous furosemide.  Ins and outs not being charted accurately due to incontinence.  Her weight has been fairly stable for the last 3 days.  Due to worsening creatinine her furosemide was held.  Volume status seems to be stable based on weight measurements.   Creatinine noted to be stable this morning compared to yesterday.   Continue to monitor urine output.   Prognosis remains poor.  Hemodialysis is not an option based on  discussions between nephrology and patient and patient's previously known wishes.   No ACE inhibitor's or ARB due to chronic kidney disease.   Had been on beta-blockers previously which had to be held during last admission due to hypotension.  Patient noted to have episodes of nonsustained ventricular tachycardia.  Started on low-dose beta-blocker with improvement on telemetry.    Blood pressure is reasonable.  CAD/ischemic cardiomyopathy: Troponin has been flat 184 >> 183 >> 179, due to CKD.  Not a candidate for cardiac cath considering her advanced tenuous renal function, repeat echo EF 25-30% global hypokinesia.   She is on aspirin and Crestor.  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 Glenna Fellows. Baseline creatinine around 5.  She is on Prograf/prednisone.   Patient has made her wishes known that she does not wish to resume dialysis and she understands the consequence of the decision. Nephrology was consulted to further address this issue.  Appreciate their assistance.  No plans to initiate dialysis at this time based on their discussion with the patient. Volume status seems to be stable based on weight.  Continue to hold Lasix.  Recheck labs tomorrow. Bicarbonate dose was increased yesterday.  Anemia of chronic renal disease/thrombocytopenia Hemoglobin is stable.  No evidence of overt blood loss. Platelet counts have been low at least since August.  Counts noted to be stable.  No evidence for bleeding.  Acute encephalopathy/uremic encephalopathy:  No focal neurological deficits noted.  CT head was negative for acute findings.  EEG without seizure activity.  Ammonia level was normal.  Mentation seems to be stable.    Paroxysmal atrial fibrillation Monitor on telemetry.  Not noted to be on anticoagulation.  On low-dose beta-blocker.  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 Continue outpatient regimen of valganciclovir  Goals of care:  Palliative care continues to follow.  Have been communicating with patient's daughter.  Transitioned to DNR.  Prognosis remains poor.  Class I Obesity:Patient's Body mass index is 32.15 kg/m. :   DVT prophylaxis: Subcutaneous heparin Code Status: DNR Family Communication: Daughter was updated yesterday.  We will do so again tomorrow. Disposition: Unclear  Status is: Inpatient  Remains inpatient appropriate because: For ongoing management of complex medical comorbidities, ongoing palliative care discussion   Objective: Vitals last 24 hrs: Vitals:   12/12/20 0940 12/12/20 1520 12/12/20 2025 12/13/20 0500  BP: 118/67 115/75 115/67 111/73  Pulse: 70 65 66 64  Resp: $Remo'16 17 18   'JQJXj$ Temp: (!) 97.4 F (36.3 C) 97.7 F (36.5 C) 97.6 F (36.4 C) (!) 97.4 F (36.3 C)  TempSrc: Oral Oral Axillary Axillary  SpO2: 93% 97% 90% 92%  Weight:    93.1 kg  Height:       Weight change: -0.2 kg  Intake/Output Summary (Last 24 hours) at 12/13/2020 1030 Last data filed at 12/12/2020 2026 Gross per 24 hour  Intake --  Output 200 ml  Net -200 ml    Net IO Since Admission: -1,090 mL [12/13/20 1030]    Physical Examination:  General appearance: Lethargic but easily arousable.  Not very communicative. Resp: Clear to auscultation bilaterally.  Normal effort Cardio: S1-S2 is normal regular.  No S3-S4.  No rubs murmurs or bruit GI: Abdomen is soft.  Nontender nondistended.  Bowel sounds are present normal.  No masses organomegaly Extremities: Stable edema bilateral lower extremity Neurologic:  No focal neurological deficits.      Medications reviewed:  Scheduled Meds:  aspirin EC  81 mg Oral q morning   citalopram  10 mg Oral Daily   heparin  5,000 Units Subcutaneous Q8H   hydroxychloroquine  200 mg Oral Daily   isosorbide-hydrALAZINE  1 tablet Oral TID   metoprolol tartrate  12.5 mg  Oral BID   pantoprazole (PROTONIX) IV  40 mg Intravenous Q12H   predniSONE  5 mg Oral Q breakfast   rosuvastatin  5 mg Oral q1800   sodium bicarbonate  1,300 mg Oral TID with meals   tacrolimus  0.5 mg Oral Daily   valGANciclovir  450 mg Oral QODAY   Continuous Infusions:  Diet Order             DIET DYS 3 Room service appropriate? Yes with Assist; Fluid consistency: Thin; Fluid restriction: 1200 mL Fluid  Diet effective now                    Weight change: -0.2 kg  Wt Readings from Last 3 Encounters:  12/13/20 93.1 kg  11/24/20 94.2 kg  11/11/20 92.9 kg     Consultants:see note  Procedures:see note  Antimicrobials:  Anti-infectives (From admission, onward)    Start     Dose/Rate Route Frequency Ordered Stop   12/08/20 1000  valGANciclovir (VALCYTE) 450 MG tablet TABS 450 mg        450 mg Oral Every other day 12/06/20 2350     12/07/20 1000  hydroxychloroquine (PLAQUENIL) tablet 200 mg       Note to Pharmacy: Recent Rx's are Plaquenil 200 mg once daily   200 mg Oral Daily 12/06/20 2350  Culture/Microbiology    Component Value Date/Time   SDES BLOOD SITE NOT SPECIFIED 12/07/2020 1408   SPECREQUEST  12/07/2020 1408    BOTTLES DRAWN AEROBIC ONLY Blood Culture results may not be optimal due to an excessive volume of blood received in culture bottles   CULT  12/07/2020 1408    NO GROWTH 5 DAYS Performed at [facility], [place] [address], [place], [place] [address]    REPTSTATUS 12/12/2020 FINAL 12/07/2020 1408    Other culture-see note  Unresulted Labs (From admission, onward)     Start     Ordered   12/13/20 6045  Basic metabolic panel  Daily,   R     Question:  Specimen collection method  Answer:  Lab=Lab collect   12/12/20 1029           Data Reviewed: I have personally reviewed following labs and imaging studies  CBC: Recent Labs  Lab 12/06/20 2014 12/07/20 0500 12/07/20 0554 12/08/20 0242 12/09/20 0557 12/10/20 0311  12/13/20 0212  WBC 3.4* 3.4*  --  3.9* 4.3 5.0 4.0  NEUTROABS 2.9 2.8  --   --   --   --   --   HGB 11.4* 11.2* 12.6 10.9* 11.5* 12.1 10.6*  HCT 35.9* 35.0* 37.0 34.7* 37.0 39.1 32.5*  MCV 91.1 91.1  --  91.8 93.9 92.7 89.8  PLT 74* 68*  --  64* 66* 69* 64*    Basic Metabolic Panel: Recent Labs  Lab 12/07/20 0500 12/07/20 0554 12/08/20 0242 12/09/20 0308 12/10/20 0545 12/11/20 0301 12/12/20 0336 12/13/20 0212  NA 139   < > 139 143 141 142 143 143  K 4.2   < > 4.3 5.3* 4.5 4.1 4.7 3.9  CL 107  --  109 112* 108 110 112* 111  CO2 16*  --  16* 14* 17* 16* 15* 18*  GLUCOSE 102*  --  88 72 83 76 81 103*  BUN 127*  --  126* 136* 136* 137* 144* 142*  CREATININE 4.99*  --  4.95* 5.16* 5.42* 5.63* 5.95* 5.96*  CALCIUM 9.7  --  9.6 9.8 10.0 9.8 9.5 9.4  MG 2.9*  --  3.1*  --   --  3.0*  --   --    < > = values in this interval not displayed.    GFR: Estimated Creatinine Clearance: 11 mL/min (A) (by C-G formula based on SCr of 5.96 mg/dL (H)). Liver Function Tests: Recent Labs  Lab 12/06/20 2014 12/07/20 0500  AST 28 28  ALT 19 18  ALKPHOS 138* 130*  BILITOT 1.6* 1.5*  PROT 7.0 6.6  ALBUMIN 3.1* 3.0*    Recent Labs  Lab 12/06/20 2014  LIPASE 22    Recent Labs  Lab 12/08/20 0242  AMMONIA 28     Sepsis Labs: Recent Labs  Lab 12/07/20 0230  PROCALCITON 0.69     Recent Results (from the past 240 hour(s))  Resp Panel by RT-PCR (Flu A&B, Covid) Nasopharyngeal Swab     Status: None   Collection Time: 12/06/20  9:48 PM   Specimen: Nasopharyngeal Swab; Nasopharyngeal(NP) swabs in vial transport medium  Result Value Ref Range Status   SARS Coronavirus 2 by RT PCR NEGATIVE NEGATIVE Final    Comment: (NOTE) SARS-CoV-2 target nucleic acids are NOT DETECTED.  The SARS-CoV-2 RNA is generally detectable in upper respiratory specimens during the acute phase of infection. The lowest concentration of SARS-CoV-2 viral copies this assay can detect is 138  copies/mL. A  negative result does not preclude SARS-Cov-2 infection and should not be used as the sole basis for treatment or other patient management decisions. A negative result may occur with  improper specimen collection/handling, submission of specimen other than nasopharyngeal swab, presence of viral mutation(s) within the areas targeted by this assay, and inadequate number of viral copies(<138 copies/mL). A negative result must be combined with clinical observations, patient history, and epidemiological information. The expected result is Negative.  Fact Sheet for Patients:  EntrepreneurPulse.com.au  Fact Sheet for Healthcare Providers:  IncredibleEmployment.be  This test is no t yet approved or cleared by the [place] FDA and  has been authorized for detection and/or diagnosis of SARS-CoV-2 by FDA under an Emergency Use Authorization (EUA). This EUA will remain  in effect (meaning this test can be used) for the duration of the COVID-19 declaration under Section 564(b)(1) of the Act, 21 U.S.C.section 360bbb-3(b)(1), unless the authorization is terminated  or revoked sooner.       Influenza A by PCR NEGATIVE NEGATIVE Final   Influenza B by PCR NEGATIVE NEGATIVE Final    Comment: (NOTE) The Xpert Xpress SARS-CoV-2/FLU/RSV plus assay is intended as an aid in the diagnosis of influenza from Nasopharyngeal swab specimens and should not be used as a sole basis for treatment. Nasal washings and aspirates are unacceptable for Xpert Xpress SARS-CoV-2/FLU/RSV testing.  Fact Sheet for Patients: EntrepreneurPulse.com.au  Fact Sheet for Healthcare Providers: IncredibleEmployment.be  This test is not yet approved or cleared by the [place] FDA and has been authorized for detection and/or diagnosis of SARS-CoV-2 by FDA under an Emergency Use Authorization (EUA). This EUA will remain in effect (meaning this test can  be used) for the duration of the COVID-19 declaration under Section 564(b)(1) of the Act, 21 U.S.C. section 360bbb-3(b)(1), unless the authorization is terminated or revoked.  Performed at [facility], [place] [address] [place], [place] [address]   Culture, blood (routine x 2)     Status: None   Collection Time: 12/07/20  2:16 AM   Specimen: BLOOD RIGHT ARM  Result Value Ref Range Status   Specimen Description BLOOD RIGHT ARM  Final   Special Requests   Final    BOTTLES DRAWN AEROBIC AND ANAEROBIC Blood Culture results may not be optimal due to an inadequate volume of blood received in culture bottles   Culture   Final    NO GROWTH 5 DAYS Performed at [facility], [place] [address], [place], [place] [address]    Report Status 12/12/2020 FINAL  Final  Culture, blood (routine x 2)     Status: None   Collection Time: 12/07/20  2:08 PM   Specimen: BLOOD  Result Value Ref Range Status   Specimen Description BLOOD SITE NOT SPECIFIED  Final   Special Requests   Final    BOTTLES DRAWN AEROBIC ONLY Blood Culture results may not be optimal due to an excessive volume of blood received in culture bottles   Culture   Final    NO GROWTH 5 DAYS Performed at [facility], [place] [address] [place], [place] [address]    Report Status 12/12/2020 FINAL  Final      Radiology Studies: No results found.   LOS: 6 days   Bonnielee Haff, MD [facility]  12/13/2020, 10:30 AM

## 2020-12-14 DIAGNOSIS — N189 Chronic kidney disease, unspecified: Secondary | ICD-10-CM | POA: Diagnosis not present

## 2020-12-14 DIAGNOSIS — Z7189 Other specified counseling: Secondary | ICD-10-CM | POA: Diagnosis not present

## 2020-12-14 DIAGNOSIS — I132 Hypertensive heart and chronic kidney disease with heart failure and with stage 5 chronic kidney disease, or end stage renal disease: Secondary | ICD-10-CM | POA: Diagnosis not present

## 2020-12-14 DIAGNOSIS — I5023 Acute on chronic systolic (congestive) heart failure: Secondary | ICD-10-CM | POA: Diagnosis not present

## 2020-12-14 DIAGNOSIS — I501 Left ventricular failure: Secondary | ICD-10-CM | POA: Diagnosis not present

## 2020-12-14 DIAGNOSIS — G9340 Encephalopathy, unspecified: Secondary | ICD-10-CM | POA: Diagnosis not present

## 2020-12-14 LAB — BASIC METABOLIC PANEL
Anion gap: 15 (ref 5–15)
BUN: 148 mg/dL — ABNORMAL HIGH (ref 8–23)
CO2: 18 mmol/L — ABNORMAL LOW (ref 22–32)
Calcium: 9.6 mg/dL (ref 8.9–10.3)
Chloride: 111 mmol/L (ref 98–111)
Creatinine, Ser: 6.18 mg/dL — ABNORMAL HIGH (ref 0.44–1.00)
GFR, Estimated: 7 mL/min — ABNORMAL LOW (ref >60)
Glucose, Bld: 94 mg/dL (ref 70–99)
Potassium: 4.2 mmol/L (ref 3.5–5.1)
Sodium: 144 mmol/L (ref 135–145)

## 2020-12-14 MED ORDER — FUROSEMIDE 10 MG/ML IJ SOLN
100.0000 mg | Freq: Once | INTRAVENOUS | Status: AC
  Administered 2020-12-14: 100 mg via INTRAVENOUS
  Filled 2020-12-14: qty 10

## 2020-12-14 NOTE — Progress Notes (Signed)
Palliative:  HPI:  65 y.o. 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     Subjective: Medical records reviewed. Patient assessed at the bedside. Breathing somewhat labored. She denies pain or distress. Patient's friend Jenna [name] and brother Jenna [name] are present at the bedside. They report she vomiting earlier today after drinking tea.  I called patient's daughter Jenna [name] to provide updates and support. She laments that there is not an option for short term dialysis and understands overall trajectory is continuing to approach end of life. Given that [name] is more alert and conversational today, she understandably would like to focus on spending quality time together during her visit this afternoon.   We discussed planning for next steps and the option of transitioning to comfort-focused care given dyspnea, edema, nausea/vomiting, and high potential for worsening symptoms. Jenna [name] feels it would be helpful to discuss a comfort care approach in more details in the coming days.  All questions/concerns addressed. PMT will continue to support holistically.  Exam: Frail elderly AA F. Alert and responding to yes/no questions intermittently. Breathing is somewhat labored. Abd soft. BLE edema, increased since yesterday.  Vital signs and nursing notes reviewed.  Plan: - Continue current care for now; patient's daughter Jenna [name] is open to discussing comfort care further on Tuesday 11/1 when I am back on service - Medical updates should be limited to daughter  Jenna [name] and brother Jenna [name], defer discussion if friend Jenna [name] is visiting - Psychosocial and emotional support provided - PMT remains available acutely. Please secure chat with urgent needs  Total time: 15 min Greater than 50% of this time was spent in counseling and coordinating care related to the above assessment and plan.  Dorthy Cooler, PA-C Palliative Medicine Team Team phone # [phone]  Thank you for allowing the Palliative Medicine Team to assist in the care of this patient. Please utilize secure chat with additional questions, if there is no response within 30 minutes please call the above phone number.  Palliative Medicine Team providers are available by phone from 7am to 7pm daily and can be reached through the team cell phone.  Should this patient require assistance outside of these hours, please call the patient's attending physician.

## 2020-12-14 NOTE — Progress Notes (Signed)
PROGRESS NOTE    Jenna [name]  HAL:[id number] DOB: Oct 21, 1955 DOA: 12/06/2020 PCP: [name], Betty G, MD    Brief Narrative: Jenna [name], 65 y.o. 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  Was recently hospitalized for congestive heart failure.  Subsequently discharged to skilled nursing facility.  Rehospitalized due to worsening lethargy.   Subjective: Patient remains lethargic.  Not really communicating any but does mention that she does not have any pain.  Moaning occasionally.  No family at bedside.    Assessment & Plan:  Progressive cardiorenal failure/Acute on chronic systolic heart failure: Repeat echo EF 25-30%, grade 3 DD, moderately elevated pulmonary artery systolic pressure. Previous EF 20-25% in July. She is followed by cardiology at [facility] / Dr. Lamonte Sakai. Chest x-ray this admission with persistent pulmonary edema, bilateral pleural effusion and infiltrates with fluid overload.  Has been poorly responsive to torsemide previously with significant rise in BUN.   Was on intravenous furosemide.  Ins and outs not being charted accurately due to incontinence.  Creatinine continues to rise despite being off of furosemide.  She appears to be a little volume overloaded today.  It does not appear that she made much urine yesterday.  We will give her a dose of IV Lasix today. Despite all of this patient's prognosis remains poor.  Hemodialysis not an option based on discussions between nephrology and patient and patient's previously  known wishes. No ACE inhibitor's or ARB due to chronic kidney disease.   Started on beta-blocker due to episodes of nonsustained ventricular tachycardia.  Improved.  CAD/ischemic cardiomyopathy: Troponin has been flat 184 >> 183 >> 179, due to CKD.  Not a candidate for cardiac cath considering her advanced tenuous renal function, repeat echo EF 25-30% global hypokinesia.   She is on aspirin and Crestor.  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 Johnney Ou. Baseline creatinine around 5.  She is on Prograf/prednisone.   Patient has made her wishes known that she does not wish to resume dialysis and she understands the consequence of the decision. Nephrology was consulted to further address this issue.  Appreciate their assistance.  No plans to initiate dialysis at this time based on their discussion with the patient. Volume seems to be up today.  We will give her dose of Lasix.  Creatinine continues to rise.  Bicarbonate level is stable.  Continue oral bicarbonate.  Anemia of chronic renal disease/thrombocytopenia Hemoglobin is stable.  No evidence of overt blood loss. Platelet counts have been low at least since August.  Counts noted to be stable.  No evidence for bleeding.  Acute encephalopathy/uremic encephalopathy:  No focal neurological deficits noted.  CT head was negative for acute findings.  EEG without seizure activity.  Ammonia level was normal.  She is slowly becoming more lethargic though still arousable.  Paroxysmal atrial fibrillation Monitor on telemetry.  Not noted to be on anticoagulation.  On low-dose beta-blocker.  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 Continue outpatient regimen of valganciclovir  Goals of care:  Palliative care continues to follow.  Have been communicating with patient's daughter.  Transitioned to  DNR.  Prognosis remains poor.  Class I Obesity:Patient's Body mass index is 32.15 kg/m. :   DVT prophylaxis: Subcutaneous heparin Code Status: DNR Family Communication: No family at bedside.  Palliative medicine in touch with the patient's daughter.  We will try to call her again today.  Status is: Inpatient  Remains inpatient appropriate because: For ongoing management of complex medical comorbidities, ongoing palliative care discussion   Objective: Vitals last 24 hrs: Vitals:   12/13/20 1421 12/13/20 2004 12/14/20 0350 12/14/20 0500  BP: 118/73 111/68 107/68   Pulse: 61 (!) 55 62   Resp:  18 19   Temp: 97.6 F (36.4 C) 97.6 F (36.4 C) 97.9 F (36.6 C)   TempSrc: Oral Oral Axillary   SpO2:   97%   Weight:    93.1 kg  Height:       Weight change: 0 kg  Intake/Output Summary (Last 24 hours) at 12/14/2020 1040 Last data filed at 12/13/2020 1700 Gross per 24 hour  Intake --  Output 1 ml  Net -1 ml    Net IO Since Admission: -1,091 mL [12/14/20 1040]    Physical Examination:  General appearance: More lethargic but still arousable. Resp: Diminished air entry at the bases with few crackles.  Normal effort. Cardio: S1-S2 is normal regular.  No S3-S4.  No rubs murmurs or bruit GI: Abdomen is soft.  Nontender nondistended.  Bowel sounds are present normal.  No masses organomegaly Extremities: Slightly worsened edema today compared to yesterday Neurologic: No focal neurological deficits.      Medications reviewed:  Scheduled Meds:  aspirin EC  81 mg Oral q morning   citalopram  10 mg Oral Daily   heparin  5,000 Units Subcutaneous Q8H   hydroxychloroquine  200 mg Oral Daily   isosorbide-hydrALAZINE  1 tablet Oral TID   metoprolol tartrate  12.5 mg Oral BID   pantoprazole (PROTONIX) IV  40 mg Intravenous Q12H   predniSONE  5 mg Oral Q breakfast   rosuvastatin  5 mg Oral q1800   sodium bicarbonate  1,300 mg Oral TID with meals   tacrolimus  0.5 mg Oral Daily    valGANciclovir  450 mg Oral QODAY   Continuous Infusions:  Diet Order             DIET DYS 3 Room service appropriate? Yes with Assist; Fluid consistency: Thin; Fluid restriction: 1200 mL Fluid  Diet effective now                    Weight change: 0 kg  Wt Readings from Last 3 Encounters:  12/14/20 93.1 kg  11/24/20 94.2 kg  11/11/20 92.9 kg     Consultants:see note  Procedures:see note  Antimicrobials:  Anti-infectives (From admission, onward)    Start     Dose/Rate Route Frequency Ordered Stop   12/08/20 1000  valGANciclovir (VALCYTE) 450 MG tablet TABS 450 mg        450 mg Oral Every other day 12/06/20 2350     12/07/20 1000  hydroxychloroquine (PLAQUENIL) tablet 200 mg       Note to Pharmacy: Recent Rx's are Plaquenil 200 mg once daily   200 mg Oral Daily 12/06/20 2350        Culture/Microbiology    Component Value Date/Time   SDES BLOOD SITE NOT SPECIFIED 12/07/2020 1408   SPECREQUEST  12/07/2020 1408  BOTTLES DRAWN AEROBIC ONLY Blood Culture results may not be optimal due to an excessive volume of blood received in culture bottles   CULT  12/07/2020 1408    NO GROWTH 5 DAYS Performed at [facility], [place] [address] [place], [place] [address]    REPTSTATUS 12/12/2020 FINAL 12/07/2020 1408    Other culture-see note  Unresulted Labs (From admission, onward)     Start     Ordered   12/13/20 7622  Basic metabolic panel  Daily,   R     Question:  Specimen collection method  Answer:  Lab=Lab collect   12/12/20 1029           Data Reviewed: I have personally reviewed following labs and imaging studies  CBC: Recent Labs  Lab 12/08/20 0242 12/09/20 0557 12/10/20 0311 12/13/20 0212  WBC 3.9* 4.3 5.0 4.0  HGB 10.9* 11.5* 12.1 10.6*  HCT 34.7* 37.0 39.1 32.5*  MCV 91.8 93.9 92.7 89.8  PLT 64* 66* 69* 64*    Basic Metabolic Panel: Recent Labs  Lab 12/08/20 0242 12/09/20 0308 12/10/20 0545 12/11/20 0301 12/12/20 0336  12/13/20 0212 12/14/20 0257  NA 139   < > 141 142 143 143 144  K 4.3   < > 4.5 4.1 4.7 3.9 4.2  CL 109   < > 108 110 112* 111 111  CO2 16*   < > 17* 16* 15* 18* 18*  GLUCOSE 88   < > 83 76 81 103* 94  BUN 126*   < > 136* 137* 144* 142* 148*  CREATININE 4.95*   < > 5.42* 5.63* 5.95* 5.96* 6.18*  CALCIUM 9.6   < > 10.0 9.8 9.5 9.4 9.6  MG 3.1*  --   --  3.0*  --   --   --    < > = values in this interval not displayed.    GFR: Estimated Creatinine Clearance: 10.6 mL/min (A) (by C-G formula based on SCr of 6.18 mg/dL (H)).   Recent Labs  Lab 12/08/20 0242  AMMONIA 28      Recent Results (from the past 240 hour(s))  Resp Panel by RT-PCR (Flu A&B, Covid) Nasopharyngeal Swab     Status: None   Collection Time: 12/06/20  9:48 PM   Specimen: Nasopharyngeal Swab; Nasopharyngeal(NP) swabs in vial transport medium  Result Value Ref Range Status   SARS Coronavirus 2 by RT PCR NEGATIVE NEGATIVE Final    Comment: (NOTE) SARS-CoV-2 target nucleic acids are NOT DETECTED.  The SARS-CoV-2 RNA is generally detectable in upper respiratory specimens during the acute phase of infection. The lowest concentration of SARS-CoV-2 viral copies this assay can detect is 138 copies/mL. A negative result does not preclude SARS-Cov-2 infection and should not be used as the sole basis for treatment or other patient management decisions. A negative result may occur with  improper specimen collection/handling, submission of specimen other than nasopharyngeal swab, presence of viral mutation(s) within the areas targeted by this assay, and inadequate number of viral copies(<138 copies/mL). A negative result must be combined with clinical observations, patient history, and epidemiological information. The expected result is Negative.  Fact Sheet for Patients:  EntrepreneurPulse.com.au  Fact Sheet for Healthcare Providers:  IncredibleEmployment.be  This test is no t  yet approved or cleared by the [place] FDA and  has been authorized for detection and/or diagnosis of SARS-CoV-2 by FDA under an Emergency Use Authorization (EUA). This EUA will remain  in effect (meaning this  test can be used) for the duration of the COVID-19 declaration under Section 564(b)(1) of the Act, 21 U.S.C.section 360bbb-3(b)(1), unless the authorization is terminated  or revoked sooner.       Influenza A by PCR NEGATIVE NEGATIVE Final   Influenza B by PCR NEGATIVE NEGATIVE Final    Comment: (NOTE) The Xpert Xpress SARS-CoV-2/FLU/RSV plus assay is intended as an aid in the diagnosis of influenza from Nasopharyngeal swab specimens and should not be used as a sole basis for treatment. Nasal washings and aspirates are unacceptable for Xpert Xpress SARS-CoV-2/FLU/RSV testing.  Fact Sheet for Patients: EntrepreneurPulse.com.au  Fact Sheet for Healthcare Providers: IncredibleEmployment.be  This test is not yet approved or cleared by the [place] FDA and has been authorized for detection and/or diagnosis of SARS-CoV-2 by FDA under an Emergency Use Authorization (EUA). This EUA will remain in effect (meaning this test can be used) for the duration of the COVID-19 declaration under Section 564(b)(1) of the Act, 21 U.S.C. section 360bbb-3(b)(1), unless the authorization is terminated or revoked.  Performed at [facility], [place] [address], [place], [place] [address]   Culture, blood (routine x 2)     Status: None   Collection Time: 12/07/20  2:16 AM   Specimen: BLOOD RIGHT ARM  Result Value Ref Range Status   Specimen Description BLOOD RIGHT ARM  Final   Special Requests   Final    BOTTLES DRAWN AEROBIC AND ANAEROBIC Blood Culture results may not be optimal due to an inadequate volume of blood received in culture bottles   Culture   Final    NO GROWTH 5 DAYS Performed at [facility], [place] [address]  [place], [place] [address]    Report Status 12/12/2020 FINAL  Final  Culture, blood (routine x 2)     Status: None   Collection Time: 12/07/20  2:08 PM   Specimen: BLOOD  Result Value Ref Range Status   Specimen Description BLOOD SITE NOT SPECIFIED  Final   Special Requests   Final    BOTTLES DRAWN AEROBIC ONLY Blood Culture results may not be optimal due to an excessive volume of blood received in culture bottles   Culture   Final    NO GROWTH 5 DAYS Performed at [facility], [place] [address] [place], [place] [address]    Report Status 12/12/2020 FINAL  Final      Radiology Studies: No results found.   LOS: 7 days   Bonnielee Haff, MD [facility]  12/14/2020, 10:40 AM

## 2020-12-15 DIAGNOSIS — N185 Chronic kidney disease, stage 5: Secondary | ICD-10-CM | POA: Diagnosis not present

## 2020-12-15 DIAGNOSIS — I129 Hypertensive chronic kidney disease with stage 1 through stage 4 chronic kidney disease, or unspecified chronic kidney disease: Secondary | ICD-10-CM | POA: Diagnosis not present

## 2020-12-15 DIAGNOSIS — E440 Moderate protein-calorie malnutrition: Secondary | ICD-10-CM | POA: Insufficient documentation

## 2020-12-15 DIAGNOSIS — G459 Transient cerebral ischemic attack, unspecified: Secondary | ICD-10-CM

## 2020-12-15 DIAGNOSIS — I2510 Atherosclerotic heart disease of native coronary artery without angina pectoris: Secondary | ICD-10-CM

## 2020-12-15 LAB — BASIC METABOLIC PANEL
Anion gap: 16 — ABNORMAL HIGH (ref 5–15)
BUN: 157 mg/dL — ABNORMAL HIGH (ref 8–23)
CO2: 18 mmol/L — ABNORMAL LOW (ref 22–32)
Calcium: 9.5 mg/dL (ref 8.9–10.3)
Chloride: 110 mmol/L (ref 98–111)
Creatinine, Ser: 6.42 mg/dL — ABNORMAL HIGH (ref 0.44–1.00)
GFR, Estimated: 7 mL/min — ABNORMAL LOW (ref >60)
Glucose, Bld: 107 mg/dL — ABNORMAL HIGH (ref 70–99)
Potassium: 4.4 mmol/L (ref 3.5–5.1)
Sodium: 144 mmol/L (ref 135–145)

## 2020-12-15 MED ORDER — PANTOPRAZOLE SODIUM 40 MG PO TBEC
40.0000 mg | DELAYED_RELEASE_TABLET | Freq: Two times a day (BID) | ORAL | Status: DC
  Administered 2020-12-15 – 2020-12-17 (×5): 40 mg via ORAL
  Filled 2020-12-15 (×5): qty 1

## 2020-12-15 MED ORDER — ISOSORB DINITRATE-HYDRALAZINE 20-37.5 MG PO TABS: 1.0000 | ORAL_TABLET | Freq: Three times a day (TID) | ORAL | Status: DC

## 2020-12-15 MED ORDER — RENA-VITE PO TABS
1.0000 | ORAL_TABLET | Freq: Every day | ORAL | Status: DC
  Administered 2020-12-15 – 2020-12-16 (×2): 1 via ORAL
  Filled 2020-12-15 (×2): qty 1

## 2020-12-15 MED ORDER — ENSURE ENLIVE PO LIQD
237.0000 mL | Freq: Two times a day (BID) | ORAL | Status: DC
  Administered 2020-12-15 – 2020-12-17 (×4): 237 mL via ORAL

## 2020-12-15 NOTE — Care Management Important Message (Signed)
Important Message  Patient Details  Name: ARDYN FORGE MRN: [id number] Date of Birth: 05-01-1955   Medicare Important Message Given:  Yes     [name] 12/15/2020, 11:47 AM

## 2020-12-15 NOTE — Progress Notes (Signed)
PROGRESS NOTE    Jenna [name]  ORV:[id number] DOB: 07/11/1955 DOA: 12/06/2020 PCP: [name], Betty G, MD    Brief Narrative: Jenna [name], 65 y.o. 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  Was recently hospitalized for congestive heart failure.  Subsequently discharged to skilled nursing facility.  Rehospitalized due to worsening lethargy.   Subjective: Patient noted to be more awake alert this morning.  Denies any pain.  Noted to be distracted.     Assessment & Plan:  Progressive cardiorenal failure/Acute on chronic systolic heart failure: Repeat echo EF 25-30%, grade 3 DD, moderately elevated pulmonary artery systolic pressure. Previous EF 20-25% in July. She is followed by cardiology at [facility] / Dr. Lamonte Sakai. Chest x-ray this admission with persistent pulmonary edema, bilateral pleural effusion and infiltrates with fluid overload.  Has been poorly responsive to torsemide previously with significant rise in BUN.   Initially on intravenous furosemide.  Creatinine continued to rise with significantly high BUN. Furosemide was held for about 48 hours.  However patient's volume went up and so she was given another dose of Lasix yesterday.  Urine output remains low.  Seems to be stable today.  Will not give any furosemide today.   Despite all this the patient's prognosis is thought to be poor.  She is not a candidate for advanced heart failure therapies and has had not wanted hemodialysis per previous discussions.   No ACE inhibitor's or  ARB due to chronic kidney disease.   Started on beta-blocker due to episodes of nonsustained ventricular tachycardia.  Improved.  CAD/ischemic cardiomyopathy: Troponin trend was flat.  She is on aspirin and Crestor.  Acute on CKD stage V with uremic encephalopathy/metabolic acidosis/hyperkalemia History of SLE S/P renal transplant in April 2020 followed by Dr. Johnney Ou. Baseline creatinine around 5.  She is on Prograf/prednisone.   Patient has made her wishes known that she does not wish to resume dialysis and she understands the consequence of the decision. Nephrology was consulted to further address this issue.  Appreciate their assistance.  No plans to initiate dialysis at this time based on their discussion with the patient. Very minimal urine output despite high-dose Lasix yesterday.  Creatinine and BUN continues to climb.  Hold Lasix today.  Prognosis is very poor.  Bicarbonate is stable   Anemia of chronic renal disease/thrombocytopenia Hemoglobin is stable.  No evidence of overt blood loss. Platelet counts have been low at least since August.  Counts noted to be stable.  No evidence for bleeding.  Acute encephalopathy/uremic encephalopathy:  No focal neurological deficits noted.  CT head was negative for acute findings.  EEG without seizure activity.  Ammonia level was normal.  She is slowly becoming more lethargic though still arousable.  Paroxysmal atrial fibrillation Monitor on telemetry.  Not noted to be on anticoagulation.  On low-dose beta-blocker.  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 Continue outpatient regimen of valganciclovir  Goals of care:  Palliative care continues to follow.  Have been communicating with patient's daughter.  Transitioned to DNR.  Prognosis remains poor.  Looks like another meeting  is to take place tomorrow when transition to comfort care will be discussed  Class I Obesity:Patient's  Body mass index is 31.87 kg/m. :   DVT prophylaxis: Subcutaneous heparin Code Status: DNR Family Communication: No family at bedside.  Palliative medicine in touch with the patient's daughter.    Status is: Inpatient  Remains inpatient appropriate because: For ongoing management of complex medical comorbidities, ongoing palliative care discussion   Objective: Vitals last 24 hrs: Vitals:   12/14/20 2109 12/14/20 2304 12/15/20 0231 12/15/20 0240  BP: 113/75   110/65  Pulse: 61   61  Resp: 18   19  Temp: (!) 97.5 F (36.4 C) 97.9 F (36.6 C)  97.8 F (36.6 C)  TempSrc: Oral Oral  Oral  SpO2: 94%   95%  Weight:   92.3 kg   Height:       Weight change: -0.8 kg  Intake/Output Summary (Last 24 hours) at 12/15/2020 0931 Last data filed at 12/14/2020 2300 Gross per 24 hour  Intake 50 ml  Output 350 ml  Net -300 ml    Net IO Since Admission: -1,391 mL [12/15/20 0931]    Physical Examination:  General appearance: More awake and alert today compared to yesterday but still very distracted. Resp: Diminished air entry at the bases without any wheezing or rhonchi.  Few crackles. Cardio: S1-S2 is normal regular.  No S3-S4.  No rubs murmurs or bruit GI: Abdomen is soft.  Nontender nondistended.  Bowel sounds are present normal.  No masses organomegaly Extremities: Lower extremity edema about the same as yesterday. Neurologic: No focal neurological deficits.     Medications reviewed:  Scheduled Meds:  aspirin EC  81 mg Oral q morning   citalopram  10 mg Oral Daily   heparin  5,000 Units Subcutaneous Q8H   hydroxychloroquine  200 mg Oral Daily   isosorbide-hydrALAZINE  1 tablet Oral TID   metoprolol tartrate  12.5 mg Oral BID   pantoprazole (PROTONIX) IV  40 mg Intravenous Q12H   predniSONE  5 mg Oral Q breakfast   rosuvastatin  5 mg Oral q1800   sodium bicarbonate  1,300 mg Oral TID with meals   tacrolimus  0.5 mg Oral Daily   valGANciclovir  450 mg Oral QODAY    Continuous Infusions:  Diet Order             DIET DYS 3 Room service appropriate? Yes with Assist; Fluid consistency: Thin; Fluid restriction: 1200 mL Fluid  Diet effective now                    Weight change: -0.8 kg  Wt Readings from Last 3 Encounters:  12/15/20 92.3 kg  11/24/20 94.2 kg  11/11/20 92.9 kg     Consultants:see note  Procedures:see note  Antimicrobials:  Anti-infectives (From admission, onward)    Start     Dose/Rate Route Frequency Ordered Stop   12/08/20 1000  valGANciclovir (VALCYTE) 450 MG tablet TABS 450 mg        450 mg Oral Every other day 12/06/20 2350     12/07/20 1000  hydroxychloroquine (PLAQUENIL) tablet 200 mg       Note to Pharmacy: Recent Rx's are Plaquenil 200 mg once daily   200 mg Oral Daily 12/06/20 2350        Culture/Microbiology    Component Value Date/Time   SDES BLOOD SITE NOT SPECIFIED 12/07/2020 1408   SPECREQUEST  12/07/2020 1408    BOTTLES DRAWN  AEROBIC ONLY Blood Culture results may not be optimal due to an excessive volume of blood received in culture bottles   CULT  12/07/2020 1408    NO GROWTH 5 DAYS Performed at [facility], [place] [address], [place], [place] [address]    REPTSTATUS 12/12/2020 FINAL 12/07/2020 1408    Other culture-see note  Unresulted Labs (From admission, onward)     Start     Ordered   12/13/20 5701  Basic metabolic panel  Daily,   R     Question:  Specimen collection method  Answer:  Lab=Lab collect   12/12/20 1029           Data Reviewed: I have personally reviewed following labs and imaging studies  CBC: Recent Labs  Lab 12/09/20 0557 12/10/20 0311 12/13/20 0212  WBC 4.3 5.0 4.0  HGB 11.5* 12.1 10.6*  HCT 37.0 39.1 32.5*  MCV 93.9 92.7 89.8  PLT 66* 69* 64*    Basic Metabolic Panel: Recent Labs  Lab 12/11/20 0301 12/12/20 0336 12/13/20 0212 12/14/20 0257 12/15/20 0157  NA 142 143 143 144 144  K 4.1 4.7 3.9 4.2 4.4  CL 110 112* 111 111 110  CO2 16*  15* 18* 18* 18*  GLUCOSE 76 81 103* 94 107*  BUN 137* 144* 142* 148* 157*  CREATININE 5.63* 5.95* 5.96* 6.18* 6.42*  CALCIUM 9.8 9.5 9.4 9.6 9.5  MG 3.0*  --   --   --   --     GFR: Estimated Creatinine Clearance: 10.2 mL/min (A) (by C-G formula based on SCr of 6.42 mg/dL (H)). No results for input(s): LIPASE, AMYLASE in the last 168 hours.   Recent Results (from the past 240 hour(s))  Resp Panel by RT-PCR (Flu A&B, Covid) Nasopharyngeal Swab     Status: None   Collection Time: 12/06/20  9:48 PM   Specimen: Nasopharyngeal Swab; Nasopharyngeal(NP) swabs in vial transport medium  Result Value Ref Range Status   SARS Coronavirus 2 by RT PCR NEGATIVE NEGATIVE Final    Comment: (NOTE) SARS-CoV-2 target nucleic acids are NOT DETECTED.  The SARS-CoV-2 RNA is generally detectable in upper respiratory specimens during the acute phase of infection. The lowest concentration of SARS-CoV-2 viral copies this assay can detect is 138 copies/mL. A negative result does not preclude SARS-Cov-2 infection and should not be used as the sole basis for treatment or other patient management decisions. A negative result may occur with  improper specimen collection/handling, submission of specimen other than nasopharyngeal swab, presence of viral mutation(s) within the areas targeted by this assay, and inadequate number of viral copies(<138 copies/mL). A negative result must be combined with clinical observations, patient history, and epidemiological information. The expected result is Negative.  Fact Sheet for Patients:  EntrepreneurPulse.com.au  Fact Sheet for Healthcare Providers:  IncredibleEmployment.be  This test is no t yet approved or cleared by the [place] FDA and  has been authorized for detection and/or diagnosis of SARS-CoV-2 by FDA under an Emergency Use Authorization (EUA). This EUA will remain  in effect (meaning this test can be used) for the  duration of the COVID-19 declaration under Section 564(b)(1) of the Act, 21 U.S.C.section 360bbb-3(b)(1), unless the authorization is terminated  or revoked sooner.       Influenza A by PCR NEGATIVE NEGATIVE Final   Influenza B by PCR NEGATIVE NEGATIVE Final    Comment: (NOTE) The Xpert Xpress SARS-CoV-2/FLU/RSV plus assay is intended as an aid in the diagnosis of  influenza from Nasopharyngeal swab specimens and should not be used as a sole basis for treatment. Nasal washings and aspirates are unacceptable for Xpert Xpress SARS-CoV-2/FLU/RSV testing.  Fact Sheet for Patients: EntrepreneurPulse.com.au  Fact Sheet for Healthcare Providers: IncredibleEmployment.be  This test is not yet approved or cleared by the [place] FDA and has been authorized for detection and/or diagnosis of SARS-CoV-2 by FDA under an Emergency Use Authorization (EUA). This EUA will remain in effect (meaning this test can be used) for the duration of the COVID-19 declaration under Section 564(b)(1) of the Act, 21 U.S.C. section 360bbb-3(b)(1), unless the authorization is terminated or revoked.  Performed at [facility], [place] [address], [place], [place] [address]   Culture, blood (routine x 2)     Status: None   Collection Time: 12/07/20  2:16 AM   Specimen: BLOOD RIGHT ARM  Result Value Ref Range Status   Specimen Description BLOOD RIGHT ARM  Final   Special Requests   Final    BOTTLES DRAWN AEROBIC AND ANAEROBIC Blood Culture results may not be optimal due to an inadequate volume of blood received in culture bottles   Culture   Final    NO GROWTH 5 DAYS Performed at [facility], [place] [address] [place], [place] [address]    Report Status 12/12/2020 FINAL  Final  Culture, blood (routine x 2)     Status: None   Collection Time: 12/07/20  2:08 PM   Specimen: BLOOD  Result Value Ref Range Status   Specimen Description BLOOD SITE NOT SPECIFIED   Final   Special Requests   Final    BOTTLES DRAWN AEROBIC ONLY Blood Culture results may not be optimal due to an excessive volume of blood received in culture bottles   Culture   Final    NO GROWTH 5 DAYS Performed at [facility], [place] [address], [place], [place] [address]    Report Status 12/12/2020 FINAL  Final      Radiology Studies: No results found.   LOS: 8 days   Bonnielee Haff, MD [facility]  12/15/2020, 9:31 AM

## 2020-12-15 NOTE — Progress Notes (Signed)
heart rate 62, bp 115/68. Spoke with Dr. Maryland Pink. Advised to hold isosorbide and administer metoprolol.

## 2020-12-15 NOTE — Progress Notes (Signed)
Jenna [name] [id number] Manufacturing engineer [facility]) Hospital Liaison note:  This patient is currently enrolled in [facility] outpatient-based Palliative Care. Will continue to follow for disposition.  Please call with any outpatient palliative questions or concerns.  Thank you, Lorelee Market, LPN [facility] Liaison [phone]

## 2020-12-15 NOTE — Progress Notes (Signed)
Pt's heart rate is dropping into the 40's. Lowest I have seen is 49. Pt asymptomatic. Spoke with Dr. Maryland Pink to monitor patient.

## 2020-12-15 NOTE — Progress Notes (Signed)
Initial Nutrition Assessment  DOCUMENTATION CODES:   Non-severe (moderate) malnutrition in context of chronic illness  INTERVENTION:   Renal Multivitamin w/ minerals daily  Ensure Enlive po BID, each supplement provides 350 kcal and 20 grams of protein  Recommend removing fluid restriction from diet due to malnutrition and poor PO. Received ok from MD. Encourage good PO intake  PLDN following for ongoing goals of care changes  NUTRITION DIAGNOSIS:   Moderate Malnutrition related to chronic illness (CHF, ESRD) as evidenced by moderate muscle depletion, moderate fat depletion.  GOAL:   Patient will meet greater than or equal to 90% of their needs  MONITOR:   PO intake, I & O's, Weight trends, Labs  REASON FOR ASSESSMENT:   Malnutrition Screening Tool    ASSESSMENT:   65 y.o. female presented to the ED from SNF with symptoms of increased SOB and chest pain. Pt recently hospitalized from 9/29 - 10/10.PMH includes Lupus, CAD, CHF, and ESRD s/p renal transplant. PT admitted with pulmonary edema w/ left heart failure.   Pts status to continue to decline, palliative care following and having daily discussions; family meeting planned for tomorrow.   Pt lethargic, answers questions with one or two word answers.   Pt reports that things are not good and that she is not hungry. Pt reports that she was like this PTA.    Per EMR, pt has not had any significant weight loss; pt weight trends show fluctuation between ~6 kg. Pt also with ESRD, refusing on HD; unable determine if weight changes are related to fluid retention versus actual dry weight changes.   Unable to discuss ONS with pt, PLDN will follow to determine which ONS is appropriate.   Medications reviewed and include: Protonix, Prednisone, Sodium Bicarbonate, Prograf,  Labs reviewed.   Admission Weight: 93.5 kg Current Weight: 92.3 kg  UOP: 350 mL x 24 hr I/O's: -1.4 L since admit  NUTRITION - FOCUSED PHYSICAL  EXAM:  Flowsheet Row Most Recent Value  Orbital Region Moderate depletion  Upper Arm Region Mild depletion  Thoracic and Lumbar Region Mild depletion  Buccal Region Moderate depletion  Temple Region Moderate depletion  Clavicle Bone Region Mild depletion  Clavicle and Acromion Bone Region Moderate depletion  Scapular Bone Region Mild depletion  Dorsal Hand No depletion  Patellar Region Unable to assess  [Pt hard to follow commands]  Anterior Thigh Region Unable to assess  Posterior Calf Region Unable to assess  Edema (RD Assessment) Mild  Hair Reviewed  Eyes Unable to assess  [Pt hard to follow commands]  Mouth Unable to assess  [Pt hard to follow commands]  Skin Reviewed  Nails Reviewed       Diet Order:   Diet Order             DIET DYS 3 Room service appropriate? Yes with Assist; Fluid consistency: Thin; Fluid restriction: 1200 mL Fluid  Diet effective now                   EDUCATION NEEDS:   Not appropriate for education at this time  Skin:  Skin Assessment: Reviewed RN Assessment  Last BM:  12/13/2020  Height:   Ht Readings from Last 1 Encounters:  12/07/20 $RemoveB'5\' 7"'KDwqajIL$  (1.702 m)    Weight:   Wt Readings from Last 1 Encounters:  12/15/20 92.3 kg    Ideal Body Weight:  61.2 kg  BMI:  Body mass index is 31.87 kg/m.  Estimated Nutritional Needs:   Kcal:  2200-2400  Protein:  110-125 grams  Fluid:  UOP + 1 L    Alleya Demeter BS, PLDN Clinical Dietitian See AMiON for contact information.

## 2020-12-16 DIAGNOSIS — E440 Moderate protein-calorie malnutrition: Secondary | ICD-10-CM

## 2020-12-16 LAB — BASIC METABOLIC PANEL
Anion gap: 18 — ABNORMAL HIGH (ref 5–15)
BUN: 160 mg/dL — ABNORMAL HIGH (ref 8–23)
CO2: 16 mmol/L — ABNORMAL LOW (ref 22–32)
Calcium: 9.5 mg/dL (ref 8.9–10.3)
Chloride: 108 mmol/L (ref 98–111)
Creatinine, Ser: 6.65 mg/dL — ABNORMAL HIGH (ref 0.44–1.00)
GFR, Estimated: 6 mL/min — ABNORMAL LOW (ref >60)
Glucose, Bld: 107 mg/dL — ABNORMAL HIGH (ref 70–99)
Potassium: 4.9 mmol/L (ref 3.5–5.1)
Sodium: 142 mmol/L (ref 135–145)

## 2020-12-16 MED ORDER — ISOSORB DINITRATE-HYDRALAZINE 20-37.5 MG PO TABS
0.5000 | ORAL_TABLET | Freq: Three times a day (TID) | ORAL | Status: DC
  Administered 2020-12-17: 0.5 via ORAL
  Filled 2020-12-16: qty 1

## 2020-12-16 NOTE — Progress Notes (Signed)
Palliative:  HPI:  64 y.o. 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     Subjective: Medical records reviewed. Patient is sleeping comfortably on exam. Called patient's daughter to discuss patient's current illness and elaborate on the option of transitioning to a full comfort-focused approach. Unfortunately, patient's kidney function continues to worsen despite medical interventions.   Reviewed comfort care and emphasis on peace, dignity, and relief from suffering. Patient would no longer receive aggressive medical interventions such as continuous vital signs, lab work, radiology testing, or medications not focused on comfort. All care would focus on how the patient is looking and feeling. This would include management of any symptoms that may cause discomfort, pain, shortness of breath, cough, nausea, agitation, anxiety, and/or secretions etc. Symptoms would be managed with medications and other non-pharmacological interventions such as spiritual support if requested, repositioning, music therapy, or therapeutic listening.   Reviewed hospice philosophy and the option of residential hospice if patient is stable for discharge. Discussed eligibility criteria including prognosis of less than 2 weeks. Provided reassurance that patient will continue to get excellent care in any setting as the natural disease process progresses. [name] plans to visit [facility] later today and continue reflecting on what decisions her mother would have preferred.  She notes that while Ms. Suezette would have been in agreement with a DNR, it is difficult to proceed with a transition to comfort care at this time.   Questions and concerns addressed. PMT will continue to support holistically.  Exam: Frail elderly AA F. Sleeping, did not attempt to wake. Bradycardic. Normal respiratory effort. Persistent BLE edema. Vital signs and nursing notes reviewed.  Plan: - Continue current care, patient's daughter will visit [facility] and agrees to discuss next steps further tomorrow - Psychosocial and emotional support provided - PMT remains available acutely. Please secure chat with urgent needs  Total time: 25 min Greater than 50% of this time was spent in counseling and coordinating care related to the above assessment and plan.  Dorthy Cooler, PA-C Palliative Medicine Team Team phone # [phone]  Thank you for allowing the Palliative Medicine Team to assist in the care of this patient. Please utilize secure chat with additional questions, if there is no response within 30 minutes please call the above phone number.  Palliative Medicine Team providers are available by phone from 7am to 7pm daily and can be reached through the team cell phone.  Should this patient require assistance outside of these hours, please call the patient's attending physician.

## 2020-12-16 NOTE — Progress Notes (Signed)
Patients daughter concerned about patients condition. Discussed pain medication and need to hold IV medication regarding VS. HR 50-60 and SBP 101. Patient given tylenol for pain. Family asking many questions regarding treatment and possible treatments in plan of care that may produce better outcomes. Reviewed information and after explanation of why treatments she proposed would not change outcome daughter became upset and given emotional support. She stated she did not want to talk any more and would make a decision in the morning. Stated patient had always told her she wanted every done if the time came and this is making her decision very difficult. Emotional support given and received well. Daughter had someone with her to give additional support.

## 2020-12-16 NOTE — Progress Notes (Addendum)
PROGRESS NOTE    Jenna [name]  ERD:[id number] DOB: 1955-12-07 DOA: 12/06/2020 PCP: [name], Betty G, MD    Brief Narrative: Jenna [name], 65 y.o. 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  Was recently hospitalized for congestive heart failure.  Subsequently discharged to skilled nursing facility.  Rehospitalized due to worsening lethargy.  Progressively worsening renal function along with worsening encephalopathy.  No dialysis based on discussions between nephrology and patient and family.  Daughter struggling with Jenna [name]. Finally made DNR.  Another meeting with take place today.   Subjective: Patient is awake alert but distracted.  Noted to be confused.  Assessment & Plan:  Progressive cardiorenal failure/Acute on chronic systolic heart failure: Repeat echo EF 25-30%, grade 3 DD, moderately elevated pulmonary artery systolic pressure. Previous EF 20-25% in July. She is followed by cardiology at [facility] / Dr. Lamonte Sakai. Chest x-ray this admission with persistent pulmonary edema, bilateral pleural effusion and infiltrates with fluid overload.  Has been poorly responsive to torsemide previously with significant rise in BUN.   Initially on intravenous furosemide.  Creatinine continued to rise with significantly high BUN. Furosemide was held for about 48 hours.  However patient's volume went up and so she was given another dose of Lasix yesterday.  Urine output remains low.  Seems to be stable today.  Will not give any furosemide today.    Despite all this the patient's prognosis is thought to be poor.  She is not a candidate for advanced heart failure therapies and has had not wanted hemodialysis per previous discussions.   No ACE inhibitor's or ARB due to chronic kidney disease.   Started on beta-blocker due to episodes of nonsustained ventricular tachycardia.  Improved.  Heart rate noted to be bradycardic.  But does not appear to be symptomatic.  Continue low-dose beta-blocker.  CAD/ischemic cardiomyopathy: Troponin trend was flat.  She is on aspirin and Crestor.  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 Johnney Ou. Baseline creatinine around 5.  She is on Prograf/prednisone.   Patient has made her wishes known that she does not wish to resume dialysis and she understands the consequence of the decision. Nephrology was consulted to further address this issue.  Appreciate their assistance.  No plans to initiate dialysis at this time based on their discussion with the patient. Creatinine continues to climb with poor urine output.  Noted to be more encephalopathic presumably from uremia.  Prognosis remains poor.  Bicarbonate noted to be low.  More  Anemia of chronic renal disease/thrombocytopenia Hemoglobin is stable.  No evidence of overt blood loss. Platelet counts have been low at least since August.  Counts noted to be stable.  No evidence for bleeding.  Acute encephalopathy/uremic encephalopathy:  CT head was negative for acute findings.  EEG without seizure activity.  Ammonia level was normal.  Encephalopathic likely due to uremia.  Paroxysmal atrial fibrillation Monitor on telemetry.  Not noted to be on anticoagulation.  On low-dose beta-blocker.  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 Continue outpatient regimen of  valganciclovir  Goals of care:  Palliative care continues to follow.  They have been communicating with patient's daughter.  Transitioned to DNR.  Prognosis remains poor.  Another meeting is planned for today and hopefully transition to comfort care.    Class I Obesity:Patient's Body mass index is 30.94 kg/m. :   DVT prophylaxis: Subcutaneous heparin Code Status: DNR Family Communication: Discussed with daughter yesterday  Status is: Inpatient  Remains inpatient appropriate because: For ongoing management of complex medical comorbidities, ongoing palliative care discussion   Objective: Vitals last 24 hrs: Vitals:   12/15/20 1345 12/15/20 2104 12/16/20 0515 12/16/20 1147  BP: 108/65 125/72 122/71 132/76  Pulse: (!) 51 (!) 57 (!) 57 (!) 57  Resp: $Remo'18 18 20 16  'dhZFa$ Temp: 97.9 F (36.6 C) (!) 96.4 F (35.8 C) (!) 96 F (35.6 C) (!) 97.4 F (36.3 C)  TempSrc: Oral Axillary Axillary Oral  SpO2:      Weight:   89.6 kg   Height:       Weight change: -2.7 kg  Intake/Output Summary (Last 24 hours) at 12/16/2020 1148 Last data filed at 12/16/2020 0900 Gross per 24 hour  Intake 200 ml  Output 200 ml  Net 0 ml    Net IO Since Admission: -1,391 mL [12/16/20 1148]    Physical Examination:  General appearance: Awake alert.  In no distress.  Distracted Resp: Clear to auscultation bilaterally.  Normal effort Cardio: S1-S2 is normal regular.  No S3-S4.  No rubs murmurs or bruit GI: Abdomen is soft.  Nontender nondistended.  Bowel sounds are present normal.  No masses organomegaly Extremities: Edema bilateral lower extremity Neurologic: Disoriented.  No focal neurological deficits.     Medications reviewed:  Scheduled Meds:  aspirin EC  81 mg Oral q morning   citalopram  10 mg Oral Daily   feeding supplement  237 mL Oral BID BM   heparin  5,000 Units Subcutaneous Q8H   hydroxychloroquine  200 mg Oral Daily   [START ON 12/17/2020] isosorbide-hydrALAZINE  0.5 tablet Oral TID    metoprolol tartrate  12.5 mg Oral BID   multivitamin  1 tablet Oral QHS   pantoprazole  40 mg Oral BID   predniSONE  5 mg Oral Q breakfast   rosuvastatin  5 mg Oral q1800   sodium bicarbonate  1,300 mg Oral TID with meals   tacrolimus  0.5 mg Oral Daily   valGANciclovir  450 mg Oral QODAY   Continuous Infusions:  Diet Order             DIET DYS 3 Room service appropriate? Yes with Assist; Fluid consistency: Thin  Diet effective now                   Nutrition Problem: Moderate Malnutrition Etiology: chronic illness (CHF, ESRD)Weight change: -2.7 kg  Wt Readings from Last 3 Encounters:  12/16/20 89.6 kg  11/24/20 94.2 kg  11/11/20 92.9 kg     Consultants:see note  Procedures:see note  Antimicrobials:  Anti-infectives (From admission, onward)    Start     Dose/Rate Route Frequency Ordered Stop   12/08/20 1000  valGANciclovir (VALCYTE) 450 MG tablet TABS 450 mg        450 mg Oral Every other day 12/06/20 2350     12/07/20 1000  hydroxychloroquine (PLAQUENIL) tablet 200 mg       Note to Pharmacy: Recent Rx's are Plaquenil 200 mg once daily  200 mg Oral Daily 12/06/20 2350        Culture/Microbiology    Component Value Date/Time   SDES BLOOD SITE NOT SPECIFIED 12/07/2020 1408   SPECREQUEST  12/07/2020 1408    BOTTLES DRAWN AEROBIC ONLY Blood Culture results may not be optimal due to an excessive volume of blood received in culture bottles   CULT  12/07/2020 1408    NO GROWTH 5 DAYS Performed at [facility], [place] [address] [place], [place] [address]    REPTSTATUS 12/12/2020 FINAL 12/07/2020 1408    Other culture-see note  Unresulted Labs (From admission, onward)     Start     Ordered   12/13/20 6045  Basic metabolic panel  Daily,   R     Question:  Specimen collection method  Answer:  Lab=Lab collect   12/12/20 1029           Data Reviewed: I have personally reviewed following labs and imaging studies  CBC: Recent Labs  Lab 12/10/20 0311  12/13/20 0212  WBC 5.0 4.0  HGB 12.1 10.6*  HCT 39.1 32.5*  MCV 92.7 89.8  PLT 69* 64*    Basic Metabolic Panel: Recent Labs  Lab 12/11/20 0301 12/12/20 0336 12/13/20 0212 12/14/20 0257 12/15/20 0157 12/16/20 0234  NA 142 143 143 144 144 142  K 4.1 4.7 3.9 4.2 4.4 4.9  CL 110 112* 111 111 110 108  CO2 16* 15* 18* 18* 18* 16*  GLUCOSE 76 81 103* 94 107* 107*  BUN 137* 144* 142* 148* 157* 160*  CREATININE 5.63* 5.95* 5.96* 6.18* 6.42* 6.65*  CALCIUM 9.8 9.5 9.4 9.6 9.5 9.5  MG 3.0*  --   --   --   --   --     GFR: Estimated Creatinine Clearance: 9.7 mL/min (A) (by C-G formula based on SCr of 6.65 mg/dL (H)). No results for input(s): LIPASE, AMYLASE in the last 168 hours.   Recent Results (from the past 240 hour(s))  Resp Panel by RT-PCR (Flu A&B, Covid) Nasopharyngeal Swab     Status: None   Collection Time: 12/06/20  9:48 PM   Specimen: Nasopharyngeal Swab; Nasopharyngeal(NP) swabs in vial transport medium  Result Value Ref Range Status   SARS Coronavirus 2 by RT PCR NEGATIVE NEGATIVE Final    Comment: (NOTE) SARS-CoV-2 target nucleic acids are NOT DETECTED.  The SARS-CoV-2 RNA is generally detectable in upper respiratory specimens during the acute phase of infection. The lowest concentration of SARS-CoV-2 viral copies this assay can detect is 138 copies/mL. A negative result does not preclude SARS-Cov-2 infection and should not be used as the sole basis for treatment or other patient management decisions. A negative result may occur with  improper specimen collection/handling, submission of specimen other than nasopharyngeal swab, presence of viral mutation(s) within the areas targeted by this assay, and inadequate number of viral copies(<138 copies/mL). A negative result must be combined with clinical observations, patient history, and epidemiological information. The expected result is Negative.  Fact Sheet for Patients:   EntrepreneurPulse.com.au  Fact Sheet for Healthcare Providers:  IncredibleEmployment.be  This test is no t yet approved or cleared by the [place] FDA and  has been authorized for detection and/or diagnosis of SARS-CoV-2 by FDA under an Emergency Use Authorization (EUA). This EUA will remain  in effect (meaning this test can be used) for the duration of the COVID-19 declaration under Section 564(b)(1) of the Act, 21 U.S.C.section 360bbb-3(b)(1), unless the authorization is terminated  or revoked sooner.       Influenza A by PCR NEGATIVE NEGATIVE Final   Influenza B by PCR NEGATIVE NEGATIVE Final    Comment: (NOTE) The Xpert Xpress SARS-CoV-2/FLU/RSV plus assay is intended as an aid in the diagnosis of influenza from Nasopharyngeal swab specimens and should not be used as a sole basis for treatment. Nasal washings and aspirates are unacceptable for Xpert Xpress SARS-CoV-2/FLU/RSV testing.  Fact Sheet for Patients: EntrepreneurPulse.com.au  Fact Sheet for Healthcare Providers: IncredibleEmployment.be  This test is not yet approved or cleared by the [place] FDA and has been authorized for detection and/or diagnosis of SARS-CoV-2 by FDA under an Emergency Use Authorization (EUA). This EUA will remain in effect (meaning this test can be used) for the duration of the COVID-19 declaration under Section 564(b)(1) of the Act, 21 U.S.C. section 360bbb-3(b)(1), unless the authorization is terminated or revoked.  Performed at [facility], [place] [address] [place], [place] [address]   Culture, blood (routine x 2)     Status: None   Collection Time: 12/07/20  2:16 AM   Specimen: BLOOD RIGHT ARM  Result Value Ref Range Status   Specimen Description BLOOD RIGHT ARM  Final   Special Requests   Final    BOTTLES DRAWN AEROBIC AND ANAEROBIC Blood Culture results may not be optimal due to an inadequate  volume of blood received in culture bottles   Culture   Final    NO GROWTH 5 DAYS Performed at [facility], [place] [address] [place], [place] [address]    Report Status 12/12/2020 FINAL  Final  Culture, blood (routine x 2)     Status: None   Collection Time: 12/07/20  2:08 PM   Specimen: BLOOD  Result Value Ref Range Status   Specimen Description BLOOD SITE NOT SPECIFIED  Final   Special Requests   Final    BOTTLES DRAWN AEROBIC ONLY Blood Culture results may not be optimal due to an excessive volume of blood received in culture bottles   Culture   Final    NO GROWTH 5 DAYS Performed at [facility], [place] [address] [place], [place] [address]    Report Status 12/12/2020 FINAL  Final      Radiology Studies: No results found.   LOS: 9 days   Bonnielee Haff, MD [facility]  12/16/2020, 11:48 AM

## 2020-12-16 NOTE — Progress Notes (Signed)
Heart Failure Navigator Progress Note  Assessed for Heart & Vascular TOC clinic readiness.  Patient does not meet criteria due to poor renal function SCr >5. Palliative team consulted this admission.   Navigator available for reassessment of patient.   Pricilla Holm, MSN, RN Heart Failure Nurse Navigator [phone]

## 2020-12-17 ENCOUNTER — Ambulatory Visit (INDEPENDENT_AMBULATORY_CARE_PROVIDER_SITE_OTHER): Payer: Medicare Other | Admitting: *Deleted

## 2020-12-17 DIAGNOSIS — I2510 Atherosclerotic heart disease of native coronary artery without angina pectoris: Secondary | ICD-10-CM

## 2020-12-17 DIAGNOSIS — N179 Acute kidney failure, unspecified: Secondary | ICD-10-CM

## 2020-12-17 DIAGNOSIS — I480 Paroxysmal atrial fibrillation: Secondary | ICD-10-CM

## 2020-12-17 DIAGNOSIS — I5020 Unspecified systolic (congestive) heart failure: Secondary | ICD-10-CM

## 2020-12-17 DIAGNOSIS — J320 Chronic maxillary sinusitis: Secondary | ICD-10-CM

## 2020-12-17 DIAGNOSIS — G459 Transient cerebral ischemic attack, unspecified: Secondary | ICD-10-CM

## 2020-12-17 DIAGNOSIS — G6289 Other specified polyneuropathies: Secondary | ICD-10-CM

## 2020-12-17 DIAGNOSIS — N185 Chronic kidney disease, stage 5: Secondary | ICD-10-CM

## 2020-12-17 DIAGNOSIS — I129 Hypertensive chronic kidney disease with stage 1 through stage 4 chronic kidney disease, or unspecified chronic kidney disease: Secondary | ICD-10-CM

## 2020-12-17 DIAGNOSIS — F419 Anxiety disorder, unspecified: Secondary | ICD-10-CM

## 2020-12-17 LAB — BASIC METABOLIC PANEL
Anion gap: 16 — ABNORMAL HIGH (ref 5–15)
BUN: 163 mg/dL — ABNORMAL HIGH (ref 8–23)
CO2: 17 mmol/L — ABNORMAL LOW (ref 22–32)
Calcium: 9.5 mg/dL (ref 8.9–10.3)
Chloride: 111 mmol/L (ref 98–111)
Creatinine, Ser: 7.03 mg/dL — ABNORMAL HIGH (ref 0.44–1.00)
GFR, Estimated: 6 mL/min — ABNORMAL LOW (ref >60)
Glucose, Bld: 137 mg/dL — ABNORMAL HIGH (ref 70–99)
Potassium: 4.8 mmol/L (ref 3.5–5.1)
Sodium: 144 mmol/L (ref 135–145)

## 2020-12-17 MED ORDER — POLYVINYL ALCOHOL 1.4 % OP SOLN
1.0000 [drp] | Freq: Four times a day (QID) | OPHTHALMIC | Status: DC | PRN
  Filled 2020-12-17: qty 15

## 2020-12-17 MED ORDER — HYDROMORPHONE HCL 1 MG/ML IJ SOLN
0.5000 mg | INTRAMUSCULAR | Status: DC | PRN
  Administered 2020-12-17 – 2020-12-19 (×6): 0.5 mg via INTRAVENOUS
  Filled 2020-12-17 (×6): qty 1

## 2020-12-17 MED ORDER — LORAZEPAM 2 MG/ML IJ SOLN
1.0000 mg | INTRAMUSCULAR | Status: DC | PRN
  Administered 2020-12-17 – 2020-12-19 (×4): 1 mg via INTRAVENOUS
  Filled 2020-12-17 (×4): qty 1

## 2020-12-17 MED ORDER — BIOTENE DRY MOUTH MT LIQD: 15.0000 mL | OROMUCOSAL | Status: DC | PRN

## 2020-12-17 MED ORDER — LORAZEPAM 2 MG/ML PO CONC: 1.0000 mg | ORAL | Status: DC | PRN

## 2020-12-17 MED ORDER — ALUM & MAG HYDROXIDE-SIMETH 200-200-20 MG/5ML PO SUSP: 30.0000 mL | Freq: Four times a day (QID) | ORAL | Status: DC | PRN

## 2020-12-17 MED ORDER — DIPHENHYDRAMINE HCL 50 MG/ML IJ SOLN: 12.5000 mg | INTRAMUSCULAR | Status: DC | PRN

## 2020-12-17 MED ORDER — GLYCOPYRROLATE 0.2 MG/ML IJ SOLN: 0.4000 mg | INTRAMUSCULAR | Status: DC | PRN

## 2020-12-17 MED ORDER — BISACODYL 10 MG RE SUPP: 10.0000 mg | Freq: Every day | RECTAL | Status: DC | PRN

## 2020-12-17 NOTE — Progress Notes (Signed)
Palliative:  HPI:  65 y.o. 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     Subjective: Medical records reviewed.  Patient assessed at the bedside.  She appears uncomfortable, grimacing and having a difficult time spitting out liquid.  Her daughter Vickii Chafe is present. Assisted patient with clearance of spit/liquid, no cough or pocketing noted.  Emotional support and therapeutic listening was provided as Jenna [name] shared her interactions with her mother yesterday evening and this morning.  Patient has stated "I cannot keep doing this" and expressing she is done with feeling so tired. Jenna [name] wondered if patient's pain was affecting this, as she appeared to be in more pain since yesterday.  Upon revisiting the discussion today, these statements were consistent.  Patient has also expressed that she thinks of "peace" when she thinks of heaven.   Jenna [name] found it very helpful to visit [employer], discuss with her aunt, and reflects spiritually with guidance from God's wisdom.  She is ready to transition patient to comfort focused care and initiate referral to hospice.  Reviewed what this would look like and reassured that patient will remain supported by palliative care throughout the end of life process. Discussed starting medications on an as-needed basis and then reassessing based on patient's individual needs.  Questions and concerns addressed.  Gone from my sight booklet provided for review.  PMT will continue to support holistically.  Exam: Frail  elderly AA F. Appears uncomfortable and restless, attempting to spit out liquid. Normal respiratory effort. Persistent BLE edema. Vital signs and nursing notes reviewed.  Plan: - Transition to comfort care today Dilaudid PRN for pain/air hunger/comfort Robinul PRN for excessive secretions Ativan PRN for agitation/anxiety Zofran PRN for nausea Liquifilm tears PRN for dry eyes Bisacodyl PRN for constipation Benadryl PRN for itching May have comfort feeding as tolerated Comfort cart for family Unrestricted visitations in the setting of EOL (per policy) Oxygen PRN 2L or less for comfort. No escalation.   - Proceed with referral to [facility] residential hospice, [facility] assistance appreciated - Psychosocial and emotional support provided - Ongoing support from PMT  Total time: 45 min Greater than 50% of this time was spent in counseling and coordinating care related to the above assessment and plan.  Jenna Cooler, PA-C Palliative Medicine Team Team phone # [phone]  Thank you for allowing the Palliative Medicine Team to assist in the care of this patient. Please utilize secure chat with additional questions, if there is no response within 30 minutes please call the above phone number.  Palliative Medicine Team providers are available by phone from 7am to 7pm daily and can be reached through the team cell phone.  Should this patient require assistance outside of these hours, please call the patient's attending physician.

## 2020-12-17 NOTE — Patient Instructions (Signed)
Visit Information  Patient verbalizes understanding of instructions provided today and agrees to view in [place].   No follow-up required.  Nat Christen LCSW Licensed Clinical Social Worker [place] [phone]

## 2020-12-17 NOTE — Progress Notes (Addendum)
CSW received consult from palliative for residential hospice referral for patient. Patients daughter request referral be made to [facility]. CSW made referral to Bevely Palmer with [facility] for [facility] for patient. Bevely Palmer confirmed she will follow up with CSW shortly on status of referral.CSW will continue to follow and assist with patients dc planning needs.

## 2020-12-17 NOTE — Progress Notes (Signed)
PROGRESS NOTE    Jenna [name]  MWN:[id number] DOB: 07/13/1955 DOA: 12/06/2020 PCP: [name], Betty G, MD    Brief Narrative: Jenna [name], 65 y.o. 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  Was recently hospitalized for congestive heart failure.  Subsequently discharged to skilled nursing facility.  Rehospitalized due to worsening lethargy.  Family has agreed to hospice placement and has chosen [facility]. Awaiting admissions to [facility]  Subjective: Patient is sleeping and peaceful.  Assessment & Plan:  Goals of care:  Palliative care continues to follow.  They have been communicating with patient's daughter.  Pt is now DNR/DNI.  Prognosis remains terminal.  Family has decided to pursue hospice care 12-17-2020.  [facility]. They are working on bed placement.  Progressive cardiorenal failure/Acute on chronic systolic heart failure: Repeat echo EF 25-30%, grade 3 DD, moderately elevated pulmonary artery systolic pressure. Previous EF 20-25% in July. She is followed by cardiology at [facility] / Dr. Lamonte Sakai. Chest x-ray this admission with persistent pulmonary edema, bilateral pleural effusion and infiltrates with fluid overload.  Has been poorly responsive to torsemide previously with significant rise in BUN.   Initially on intravenous furosemide.  Creatinine continued to rise with significantly high BUN. Furosemide was held for about 48 hours.  However patient's volume went up and so she was given another dose of Lasix  yesterday.  Urine output remains low.  Seems to be stable today.  Will not give any furosemide today.   Despite all this the patient's prognosis is thought to be poor.  She is not a candidate for advanced heart failure therapies and has had not wanted hemodialysis per previous discussions.   No ACE inhibitor's or ARB due to chronic kidney disease.   Started on beta-blocker due to episodes of nonsustained ventricular tachycardia.  Improved.  Heart rate noted to be bradycardic.  But does not appear to be symptomatic.  Continue low-dose beta-blocker.  CAD/ischemic cardiomyopathy: Troponin trend was flat.  She is on aspirin and Crestor.  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 Johnney Ou. Baseline creatinine around 5.  She is on Prograf/prednisone.   Patient has made her wishes known that she does not wish to resume dialysis and she understands the consequence of the decision. Nephrology was consulted to further address this issue.  Appreciate their assistance.  No plans to initiate dialysis at this time based on their discussion with the patient. Creatinine continues to climb with poor urine output.  Noted to be more encephalopathic presumably from uremia.  Prognosis remains poor.  Bicarbonate noted to be low.   Anemia of chronic renal disease/thrombocytopenia Hemoglobin is stable.  No evidence of overt blood loss. Platelet counts have been low at least since August.  Counts noted to be stable.  No evidence for bleeding.  Acute encephalopathy/uremic encephalopathy:  CT head was negative for acute findings.  EEG without seizure activity.  Ammonia level was normal.  Encephalopathic likely due to uremia.  Paroxysmal atrial fibrillation Monitor on telemetry.  Not noted to be on anticoagulation.  On low-dose beta-blocker.  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 Continue outpatient regimen of valganciclovir  Class I Obesity:Patient's Body mass index is 31.08 kg/m. :   DVT prophylaxis: Subcutaneous heparin Code Status: DNR Family Communication: no family at bedside.  Status is: Inpatient  Remains inpatient appropriate because: For ongoing management of complex medical comorbidities, ongoing palliative care discussion   Objective: Vitals last 24 hrs: Vitals:   12/16/20 1147 12/16/20 2030 12/17/20 0500 12/17/20 0539  BP: 132/76 117/84  110/85  Pulse: (!) 57 (!) 55  (!) 59  Resp: 16 15    Temp: (!) 97.4 F (36.3 C) (!) 96.8 F (36 C)  (!) 96.6 F (35.9 C)  TempSrc: Oral Axillary  Axillary  SpO2:  92%  93%  Weight:   90 kg   Height:       Weight change: 0.4 kg No intake or output data in the 24 hours ending 12/17/20 1643  Net IO Since Admission: -1,391 mL [12/17/20 1643]    Physical Examination:  Physical Exam Vitals and nursing note reviewed.  Constitutional:      General: She is not in acute distress.    Appearance: She is not toxic-appearing or diaphoretic.     Comments: Appears chronically ill Sleeping peacefully  HENT:     Head: Normocephalic and atraumatic.     Nose: Nose normal. No rhinorrhea.  Cardiovascular:     Rate and Rhythm: Normal rate.     Pulses: Normal pulses.     Heart sounds: Murmur heard.  Pulmonary:     Effort: Pulmonary effort is normal. No respiratory distress.     Breath sounds: No wheezing or rales.  Abdominal:     General: There is no distension.     Tenderness: There is no abdominal tenderness. There is no guarding.  Skin:    Capillary Refill: Capillary refill takes less than 2 seconds.      Medications reviewed:  Scheduled Meds:   Continuous Infusions:  Diet Order             DIET DYS 3 Room service appropriate? Yes with Assist; Fluid consistency: Thin  Diet effective now                    Nutrition Problem: Moderate Malnutrition Etiology: chronic illness (CHF, ESRD)Weight change: 0.4 kg  Wt Readings from Last 3 Encounters:  12/17/20 90 kg  11/24/20 94.2 kg  11/11/20 92.9 kg     Consultants:see note  Procedures:see note  Antimicrobials:  Anti-infectives (From admission, onward)    Start     Dose/Rate Route Frequency Ordered Stop   12/08/20 1000  valGANciclovir (VALCYTE) 450 MG tablet TABS 450 mg  Status:  Discontinued        450 mg Oral Every other day 12/06/20 2350 12/17/20 1304   12/07/20 1000  hydroxychloroquine (PLAQUENIL) tablet 200 mg  Status:  Discontinued       Note to Pharmacy: Recent Rx's are Plaquenil 200 mg once daily   200 mg Oral Daily 12/06/20 2350 12/17/20 1304      Culture/Microbiology    Component Value Date/Time   SDES BLOOD SITE NOT SPECIFIED 12/07/2020 1408   SPECREQUEST  12/07/2020 1408    BOTTLES DRAWN AEROBIC ONLY Blood Culture results may not be optimal due to an excessive volume of blood received in culture bottles   CULT  12/07/2020 1408    NO GROWTH 5 DAYS Performed  at [facility], [place] [address], [place],  [address]    REPTSTATUS 12/12/2020 FINAL 12/07/2020 1408    Other culture-see note  Unresulted Labs (From admission, onward)    None      Data Reviewed: I have personally reviewed following labs and imaging studies  CBC: Recent Labs  Lab 12/13/20 0212  WBC 4.0  HGB 10.6*  HCT 32.5*  MCV 89.8  PLT 64*   Basic Metabolic Panel: Recent Labs  Lab 12/11/20 0301 12/12/20 0336 12/13/20 0212 12/14/20 0257 12/15/20 0157 12/16/20 0234 12/17/20 0321  NA 142   < > 143 144 144 142 144  K 4.1   < > 3.9 4.2 4.4 4.9 4.8  CL 110   < > 111 111 110 108 111  CO2 16*   < > 18* 18* 18* 16* 17*  GLUCOSE 76   < > 103* 94 107* 107* 137*  BUN 137*   < > 142* 148* 157* 160* 163*  CREATININE 5.63*   < > 5.96* 6.18* 6.42* 6.65* 7.03*  CALCIUM 9.8   < > 9.4 9.6 9.5 9.5 9.5  MG 3.0*  --   --   --   --   --   --     < > = values in this interval not displayed.   GFR: Estimated Creatinine Clearance: 9.2 mL/min (A) (by C-G formula based on SCr of 7.03 mg/dL (H)). No results for input(s): LIPASE, AMYLASE in the last 168 hours.   No results found for this or any previous visit (from the past 240 hour(s)).     Radiology Studies: No results found.   LOS: 10 days   Disposition: awaiting bed placement at residential hospice.  Kristopher Oppenheim, DO [facility]  12/17/2020, 4:43 PM

## 2020-12-17 NOTE — Chronic Care Management (AMB) (Signed)
Chronic Care Management    Clinical Social Work Note  12/17/2020 Name: Jenna [name] MRN: [id number] DOB: 06/03/1955  Jenna [name] is a 65 y.o. year old female who is a primary care patient of [name], [name], MD. The CCM team was consulted to assist the patient with chronic disease management and/or care coordination needs related to: [place] Education.   Engaged with patient's daughter by telephone for follow-up visit in response to provider referral for social work chronic care management and care coordination services.   Consent to Services:  The patient was given information about Chronic Care Management services, agreed to services, and gave verbal consent prior to initiation of services.  Please see initial visit note for detailed documentation.   Patient agreed to services and consent obtained.   Assessment: Review of patient past medical history, allergies, medications, and health status, including review of relevant consultants reports was performed today as part of a comprehensive evaluation and provision of chronic care management and care coordination services.     SDOH (Social Determinants of Health) assessments and interventions performed:    Advanced Directives Status: Not addressed in this encounter.  CCM Care Plan  Allergies  Allergen Reactions   Enalapril Maleate Anaphylaxis, Swelling and Other (See Comments)    Throat swells   Penicillins Other (See Comments)    Made patient lightheaded PATIENT HAS HAD A PCN REACTION WITH IMMEDIATE RASH, FACIAL/TONGUE/THROAT SWELLING, SOB, OR LIGHTHEADEDNESS WITH HYPOTENSION:  #  #  YES  #  #  Has patient had a PCN reaction causing severe rash involving mucus membranes or skin necrosis: No Has patient had a PCN reaction that required hospitalization: No Has patient had a PCN reaction occurring within the last 10 years: No If all of the above answers are "NO", then may proceed with Cephalosporin  use.    Chocolate Nausea And Vomiting   Tape Itching, Rash and Other (See Comments)    Facility-Administered Encounter Medications as of 12/17/2020  Medication   acetaminophen (TYLENOL) tablet 650 mg   Or   acetaminophen (TYLENOL) suppository 650 mg   albuterol (PROVENTIL) (2.5 MG/3ML) 0.083% nebulizer solution 2.5 mg   nitroGLYCERIN (NITROSTAT) SL tablet 0.4 mg   ondansetron (ZOFRAN) tablet 4 mg   Or   ondansetron (ZOFRAN) injection 4 mg   polyethylene glycol (MIRALAX / GLYCOLAX) packet 17 g   [DISCONTINUED] aspirin EC tablet 81 mg   [DISCONTINUED] citalopram (CELEXA) tablet 10 mg   [DISCONTINUED] feeding supplement (ENSURE ENLIVE / ENSURE PLUS) liquid 237 mL   [DISCONTINUED] heparin injection 5,000 Units   [DISCONTINUED] HYDROmorphone (DILAUDID) injection 0.5 mg   [DISCONTINUED] hydroxychloroquine (PLAQUENIL) tablet 200 mg   [DISCONTINUED] isosorbide-hydrALAZINE (BIDIL) 20-37.5 MG per tablet 0.5 tablet   [DISCONTINUED] metoprolol tartrate (LOPRESSOR) tablet 12.5 mg   [DISCONTINUED] multivitamin (RENA-VIT) tablet 1 tablet   [DISCONTINUED] naloxone (NARCAN) injection 0.4 mg   [DISCONTINUED] pantoprazole (PROTONIX) EC tablet 40 mg   [DISCONTINUED] predniSONE (DELTASONE) tablet 5 mg   [DISCONTINUED] rosuvastatin (CRESTOR) tablet 5 mg   [DISCONTINUED] sodium bicarbonate tablet 1,300 mg   [DISCONTINUED] tacrolimus (PROGRAF) capsule 0.5 mg   [DISCONTINUED] valGANciclovir (VALCYTE) 450 MG tablet TABS 450 mg   Outpatient Encounter Medications as of 12/17/2020  Medication Sig Note   acetaminophen (TYLENOL) 500 MG tablet Take 1,000 mg by mouth every 6 (six) hours as needed for headache.    ALPRAZolam (XANAX) 0.25 MG tablet Take 1 tablet (0.25 mg total) by mouth at bedtime as needed for  anxiety.    aspirin 81 MG EC tablet Take 81 mg by mouth every morning.    citalopram (CELEXA) 10 MG tablet Take 1 tablet (10 mg total) by mouth daily.    hydroxychloroquine (PLAQUENIL) 200 MG tablet Take 1  tablet (200 mg total) by mouth 2 (two) times daily.    isosorbide-hydrALAZINE (BIDIL) 20-37.5 MG tablet Take 1 tablet by mouth 3 (three) times daily.    mirtazapine (REMERON) 7.5 MG tablet Take 7.5 mg by mouth at bedtime.    nitroGLYCERIN (NITROSTAT) 0.4 MG SL tablet Place 0.4 mg under the tongue every 5 (five) minutes as needed for chest pain.     ondansetron (ZOFRAN) 4 MG tablet Take 1 tablet (4 mg total) by mouth every 6 (six) hours as needed for nausea.    oxyCODONE (OXY IR/ROXICODONE) 5 MG immediate release tablet Take 5 mg by mouth every 4 (four) hours as needed for severe pain.    pantoprazole sodium (PROTONIX) 40 mg Place 40 mg into feeding tube daily.    predniSONE (DELTASONE) 5 MG tablet Take 5 mg by mouth daily. 12/07/2020: Continuous   rosuvastatin (CRESTOR) 5 MG tablet Take 5 mg by mouth daily at 6 PM.    simethicone (MYLICON) 80 MG chewable tablet Chew 80 mg by mouth every 6 (six) hours as needed for flatulence.    sodium bicarbonate 650 MG tablet Take 1 tablet (650 mg total) by mouth 3 (three) times daily.    sodium zirconium cyclosilicate (LOKELMA) 5 g packet Take 5 g by mouth daily.    tacrolimus (PROGRAF) 0.5 MG capsule Take 1 capsule (0.5 mg total) by mouth daily. (Patient taking differently: Take 0.5 mg by mouth 2 (two) times daily.)    valGANciclovir (VALCYTE) 450 MG tablet Take 450 mg by mouth every other day.     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   Fluid overload 11/13/2020   Pulmonary edema with left heart failure ([place])    AKI (acute kidney injury) ([place]) 11/02/2020   Right upper quadrant pain    Acute renal failure superimposed on stage 4 chronic kidney disease ([place]) 11/01/2020   Pyelonephritis of transplanted kidney 11/01/2020   Thrombocytopenia ([place]) 11/01/2020   Liver mass 11/01/2020    Heme positive stool 11/01/2020   Insomnia 10/29/2020   Peripheral neuropathy 10/13/2020   Acute on chronic systolic CHF (congestive heart failure) ([place]) 07/11/2020   Acute respiratory failure with hypoxia ([place]) 07/11/2020   Cytomegalovirus (CMV) viremia ([place]) 07/11/2020   Elevated troponin 07/11/2020   Anemia of chronic disease 07/11/2020   Acute kidney injury superimposed on chronic kidney disease ([place]) 07/11/2020   Allergic rhinitis 01/01/2020   Anxiety disorder 06/26/2019   HFrEF (heart failure with reduced ejection fraction) ([place]) Mar 30, 2019   Coronary artery disease involving native coronary artery of native heart 03/30/19   Paroxysmal atrial fibrillation ([place]) 03/30/2019   Deceased-donor kidney transplant 03/27/2018   Dyslipidemia 11/06/2017   CKD (chronic kidney disease) stage 5, GFR less than 15 ml/min ([name]) 02/15/2017   Back pain at L4-L5 level 10/31/2014   Muscle spasm of back 10/31/2014   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  Awaiting Bed Availability at [facility].  Care Plan : LCSW Plan of Care  Updates made by Francis Gaines, LCSW since 12/17/2020 12:00 AM     Problem: Reduce and Manage My Symptoms of Anxiety and Depression. Resolved 12/17/2020  Priority: High     Goal: Reduce and Manage My Symptoms of Anxiety and Depression. Completed 12/17/2020  Start Date: 10/16/2020  Expected End Date: 12/17/2020  This Visit's Progress: On track  Recent Progress: On track   Priority: High  Note:   Current Barriers:   Acute Mental Health needs related to Anxiety, Depression and Chronic Kidney Disease, Stage 5, requires Support, Education, Resources, Referrals and Care Coordination in order to meet unmet mental health needs. Clinical Goal(s):  Patient will work with LCSW to reduce and manage symptoms of Anxiety and Depression, until established with a community provider.   Patient will increase knowledge and/or ability of:  Coping Skills, Healthy Habits, Self-Management Skills, Stress Reduction, Home Safety and Utilizing [employer] and Resources.   Clinical Interventions:  Emotional Support provided, Quality of Sleep assessed and Sleep Hygiene Techniques promoted, Increase Level of Activity/Exercise encouraged, Verbalization of Feelings emphasized.   Collaboration with Primary Care Physician, Dr. Betty [name] regarding development and update of comprehensive plan of care as evidenced by provider attestation and co-signature. Inter-disciplinary care team collaboration (see longitudinal plan of care). Patient Goals/Self-Care Activities: LCSW collaboration with patient's daughter to confirm their decision to elect Hospice and Palliative Care services through [facility].  Patient will be transferred to [facility], for end-of-life care, once bed is available.   Follow-Up:  No follow-up required.    Nat Christen LCSW Licensed Clinical Social Worker [place] [phone]

## 2020-12-17 NOTE — Progress Notes (Addendum)
[place] [id number] [facility]) Hospital Liaison Note  Received request from Transitions of Care Manager for family interest in [facility]. Chart reviewed and spoke with family to acknowledge referral. Patient hospice eligibility status is approved. Unfortunately, [facility] is not able to offer a room today. Family and Transitions of Care Manager aware hospital liaison will follow up tomorrow or sooner if room becomes available. Please do not hesitate to call with questions.    Please call with any questions/concerns.    Thank you for the opportunity to participate in this patient's care.   Daphene Calamity, MSW [facility] Liaison  [phone]

## 2020-12-18 NOTE — Progress Notes (Signed)
PROGRESS NOTE    Jenna [name]   OMQ:[id number]  DOB: 1955/05/24  PCP: [place], Betty G, MD    DOA: 12/06/2020 LOS: 11    Brief Narrative / Hospital Course to Date:   "Jenna [name], 65 y.o. 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  Was recently hospitalized for congestive heart failure.  Subsequently discharged to skilled nursing facility.  Rehospitalized due to worsening lethargy.   Family has agreed to hospice placement and has chosen [facility]. Awaiting admissions to [facility]"  Assessment & Plan   Principal Problem:   Pulmonary edema with left heart failure ([name]) Active Problems:   Lupus (systemic lupus erythematosus) ([name])   Goals of care, counseling/discussion   CKD (chronic kidney disease) stage 5, GFR less than 15 ml/min ([name])   Coronary artery disease involving native coronary artery of native heart   Paroxysmal atrial fibrillation ([name])   Acute on chronic systolic CHF (congestive heart failure) ([name])   Cytomegalovirus (CMV) viremia ([name])   Chest pain   Uremic encephalopathy   Cardiorenal syndrome   Encephalopathy acute   Malnutrition of moderate degree   Comfort Care Status -  --Comfort care per orders --D/c to residential hospice pending available bed --Notify provider if any signs of distress, pain or discomfort despite comfort meds     Prior A&P - before transition to comfort care:   "Goals of care:  Palliative care continues to follow.  They have been communicating with patient's daughter.  Pt is now  DNR/DNI.  Prognosis remains terminal.  Family has decided to pursue hospice care 12-17-2020.  [facility]. They are working on bed placement.   Progressive cardiorenal failure/Acute on chronic systolic heart failure: Repeat echo EF 25-30%, grade 3 DD, moderately elevated pulmonary artery systolic pressure. Previous EF 20-25% in July. She is followed by cardiology at [facility] / Dr. Delton Coombes. Chest x-ray this admission with persistent pulmonary edema, bilateral pleural effusion and infiltrates with fluid overload.  Has been poorly responsive to torsemide previously with significant rise in BUN.   Initially on intravenous furosemide.  Creatinine continued to rise with significantly high BUN. Furosemide was held for about 48 hours.  However patient's volume went up and so she was given another dose of Lasix...  Urine output remains low.   Despite all this the patient's prognosis is thought to be poor.  She is not a candidate for advanced heart failure therapies and has had not wanted hemodialysis per previous discussions.   No ACE inhibitor's or ARB due to chronic kidney disease.   Started on beta-blocker due to episodes of nonsustained ventricular tachycardia.  Improved.   Heart rate noted to be bradycardic, BB stopped   CAD/ischemic cardiomyopathy: Troponin trend was flat.   Stopped aspirin and Crestor.   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 Glenna Fellows. Baseline creatinine around 5.  Was on Prograf/prednisone.   Patient has made her wishes known that she does not wish to resume dialysis and she understands the consequence of the decision. Nephrology was consulted.  No plans to initiate dialysis with comfort care goal now.   Anemia of chronic renal disease/thrombocytopenia Hemoglobin is stable.  No evidence of overt blood loss. Platelet counts have been  low at least since August.     Acute encephalopathy/uremic encephalopathy:  CT head was negative for acute findings.   EEG without seizure activity.   Ammonia level was normal.   Encephalopathic likely due to uremia.   Paroxysmal atrial fibrillation Monitor on telemetry.  Not noted to be on anticoagulation.  Now off beta-blocker.   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  Off outpatient regimen of valganciclovir   Class I Obesity:Patient's Body mass index is 31.08 kg/m. : "   Patient BMI: Body mass index is 31.08 kg/m.   DVT prophylaxis: Place TED hose Start: 12/07/20 1245   Diet:  Diet Orders (From admission, onward)     Start     Ordered   12/15/20 1513  DIET DYS 3 Room service appropriate? Yes with Assist; Fluid consistency: Thin  Diet effective now       Comments: Meds as tolerated; will likely need crushed  Question Answer Comment  Room service appropriate? Yes with Assist   Fluid consistency: Thin      12/15/20 1512              Code Status: DNR   Subjective 12/18/20    Pt was sleeping and appeared comfortable when seen today.  No family at bedside during time of my encounter.  No acute events reported.    Disposition Plan & Communication   Status is: Inpatient  Remains inpatient appropriate because: on comfort care pending available bed at residential hospice.   Consults, Procedures, Significant Events   Consultants:  Palliative Care Nephrology  Procedures:  none  Antimicrobials:  Anti-infectives (From admission, onward)    Start     Dose/Rate Route Frequency Ordered Stop   12/08/20 1000  valGANciclovir (VALCYTE) 450 MG tablet TABS 450 mg  Status:  Discontinued        450 mg Oral Every other day 12/06/20 2350 12/17/20 1304   12/07/20 1000  hydroxychloroquine (PLAQUENIL) tablet 200 mg  Status:  Discontinued       Note to Pharmacy: Recent Rx's are Plaquenil 200 mg once daily   200  mg Oral Daily 12/06/20 2350 12/17/20 1304         Micro    Objective   Vitals:   12/17/20 0500 12/17/20 0539 12/18/20 0538 12/18/20 1219  BP:  110/85 119/84 119/66  Pulse:  (!) 59 66 71  Resp:   20 18  Temp:  (!) 96.6 F (35.9 C) (!) 96.2 F (35.7 C) (!) 96.5 F (35.8 C)  TempSrc:  Axillary Axillary Axillary  SpO2:  93% 98%   Weight: 90 kg     Height:        Intake/Output Summary (Last 24 hours) at 12/18/2020 1755 Last data filed at 12/18/2020 1124 Gross per 24 hour  Intake 0 ml  Output --  Net 0 ml   Filed Weights   12/15/20 0231 12/16/20 0515 12/17/20 0500  Weight: 92.3 kg 89.6 kg 90 kg    Physical Exam:  General exam: sleeping comfortably, no acute distress earing grossly normal  Respiratory system: normal respiratory effort, symmetric chest rise. Cardiovascular system: normal S1/S2, RRR, no pedal edema.   Gastrointestinal system: soft, NT Central nervous system: no gross focal neurologic deficits, minimally responsive  Labs   Data Reviewed: I have personally reviewed following labs and imaging studies  CBC: Recent Labs  Lab 12/13/20 0212  WBC 4.0  HGB 10.6*  HCT 32.5*  MCV 89.8  PLT 64*   Basic Metabolic Panel: Recent Labs  Lab 12/13/20 0212 12/14/20 0257 12/15/20 0157 12/16/20 0234 12/17/20 0321  NA 143 144 144 142 144  K 3.9 4.2 4.4 4.9 4.8  CL 111 111 110 108 111  CO2 18* 18* 18* 16* 17*  GLUCOSE 103* 94 107* 107* 137*  BUN 142* 148* 157* 160* 163*  CREATININE 5.96* 6.18* 6.42* 6.65* 7.03*  CALCIUM 9.4 9.6 9.5 9.5 9.5   GFR: Estimated Creatinine Clearance: 9.2 mL/min (A) (by C-G formula based on SCr of 7.03 mg/dL (H)). Liver Function Tests: No results for input(s): AST, ALT, ALKPHOS, BILITOT, PROT, ALBUMIN in the last 168 hours. No results for input(s): LIPASE, AMYLASE in the last 168 hours. No results for input(s): AMMONIA in the last 168 hours. Coagulation Profile: No results for input(s): INR, PROTIME in the last 168  hours. Cardiac Enzymes: No results for input(s): CKTOTAL, CKMB, CKMBINDEX, TROPONINI in the last 168 hours. BNP (last 3 results) No results for input(s): PROBNP in the last 8760 hours. HbA1C: No results for input(s): HGBA1C in the last 72 hours. CBG: No results for input(s): GLUCAP in the last 168 hours. Lipid Profile: No results for input(s): CHOL, HDL, LDLCALC, TRIG, CHOLHDL, LDLDIRECT in the last 72 hours. Thyroid Function Tests: No results for input(s): TSH, T4TOTAL, FREET4, T3FREE, THYROIDAB in the last 72 hours. Anemia Panel: No results for input(s): VITAMINB12, FOLATE, FERRITIN, TIBC, IRON, RETICCTPCT in the last 72 hours. Sepsis Labs: No results for input(s): PROCALCITON, LATICACIDVEN in the last 168 hours.  No results found for this or any previous visit (from the past 240 hour(s)).    Imaging Studies   No results found.   Medications   Scheduled Meds: Continuous Infusions:     LOS: 11 days    Time spent: 20 minutes    Ezekiel Slocumb, DO [facility]  12/18/2020, 5:55 PM      If 7PM-7AM, please contact night-coverage. How to contact the [facility] Attending or Consulting provider [place] or covering provider during after hours [place], for this patient?    Check the care team in [facility] and look for a) attending/consulting [facility] provider listed and b) the [facility] team listed Log into [email or web address] and use [place]'s universal password to access. If you do not have the password, please contact the hospital operator. Locate the [facility] provider you are looking for under [facility] and page to a number that you can be directly reached. If you still have difficulty reaching the provider, please page the [facility] (Director on Call) for the Hospitalists listed on amion for assistance.

## 2020-12-18 NOTE — Care Management Important Message (Signed)
Important Message  Patient Details  Name: Jenna [name] MRN: [id number] Date of Birth: 1956-01-02   Medicare Important Message Given:  Yes     [name] 12/18/2020, 10:56 AM

## 2020-12-18 NOTE — Progress Notes (Addendum)
[place] [id number] [facility]) Hospital Liaison Note  09:52 am-- MSW spoke with daughter/[name] this AM and was able to offer bed at [facility]. [name] would like to touch base with hospital MD first for further discussion regarding hospital comfort measures before proceeding as she was unclear of what this entailed. Emotional support provided. MSW reports that following conversation with MD, to call MSW with updates of how she would like to proceed.  12:52 pm-- MSW contacted [name] fora updates of [facility] bed offer. No answer. Unable to leave VM as VM box is full. TOC(s) notified.    Addendum  2:49 PM: Family accepted [facility] bed offer and will transported tomorrow once consent forms are completed. Consents scheduled to be signed @ 10:00 am on 11/4.  Please call with any questions/concerns.    Thank you for the opportunity to participate in this patient's care.   Daphene Calamity, MSW [place] Liaison  [phone]

## 2020-12-18 NOTE — Progress Notes (Signed)
Daily Progress Note   Patient Name: Jenna [name]       Date: 12/18/2020 DOB: Nov 13, 1955  Age: 65 y.o. MRN#: [id number] Attending Physician: Ezekiel Slocumb, DO Primary Care Physician: [name], Betty G, MD Admit Date: 12/06/2020  Reason for Consultation/Follow-up: Establishing goals of care  Patient Profile/HPI:  65 y.o. 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 Workup has revealed progressive renal failure related to cardiorenal syndrome. She has been transitioned to full comfort measures only and referral to residential hospice has been made.     Subjective: Patient in bed, no family at bedside. She appears uncomfortable with furrowed brow and restlessness.  I received call from daughter, [name].  [name] expressed her worries about not fully understanding transition to comfort measures and that the transition included stopping some of her maintenance medications.  [name] was concerned about stopping patient's anti-rejections medications. She worried that she was hastening Danyia's death.  We discussed that Asanti's heart and kidneys are failing now as a result of her heart failure- not because her body is rejecting her kidneys. At this point she is going to die due to the cardiorenal failure- not because of her her body rejecting her  kidneys even with stopping the anti-rejection medications. Additionally, she is unable to swallow any pills.  I reassured [name] that stopping the anti-rejection medications were not hastening her Mom's death. That the only role the medications would play at this point would be to burden her with just another thing to do.  At close of our discussion [name] was clear and comfortable with all decisions made and proceeding with comfort measures and hospice.   Review of Systems  Unable to perform ROS: Acuity of condition    Physical Exam Vitals and nursing note reviewed.  Constitutional:      Appearance: She is ill-appearing.     Comments: Looks uncomfortable, grimacing, restless  Neurological:     Comments: No verbal response  Vital Signs: BP 119/66 (BP Location: Right Arm)   Pulse 71   Temp (!) 96.5 F (35.8 C) (Axillary)   Resp 18   Ht $R'5\' 7"'mD$  (1.702 m)   Wt 90 kg   SpO2 98%   BMI 31.08 kg/m  SpO2: SpO2: 98 % O2 Device: O2 Device: Room Air O2 Flow Rate:    Intake/output summary:  Intake/Output Summary (Last 24 hours) at 12/18/2020 1320 Last data filed at 12/18/2020 1124 Gross per 24 hour  Intake 0 ml  Output --  Net 0 ml   LBM: Last BM Date: 12/17/20 Baseline Weight: Weight: 93.5 kg Most recent weight: Weight: 90 kg       Palliative Assessment/Data: PPS: 10%        Palliative Care Assessment & Plan    Assessment/Recommendations/Plan  Continue current comfort interventions Spoke with [facility]- she is likely to discharge tomorrow to [facility]   Code Status: DNR  Prognosis:  < 2 weeks  Discharge Planning: Hospice facility  Care plan was discussed with patient and care team  Thank you for allowing the Palliative Medicine Team to assist in the care of this patient.  Total time: 55 minutes Prolonged billing: No     Greater than 50%  of this time was spent counseling and coordinating care related to the above assessment and  plan.  Mariana Kaufman, AGNP-C Palliative Medicine   Please contact Palliative Medicine Team phone at [phone] for questions and concerns.

## 2020-12-19 MED ORDER — HALOPERIDOL LACTATE 5 MG/ML IJ SOLN: 5.0000 mg | Freq: Four times a day (QID) | INTRAMUSCULAR | Status: AC | PRN

## 2020-12-19 MED ORDER — ALUM & MAG HYDROXIDE-SIMETH 200-200-20 MG/5ML PO SUSP: 30.0000 mL | Freq: Four times a day (QID) | ORAL | 0 refills | Status: AC | PRN

## 2020-12-19 MED ORDER — POLYVINYL ALCOHOL 1.4 % OP SOLN: 1.0000 [drp] | Freq: Four times a day (QID) | OPHTHALMIC | 0 refills | Status: AC | PRN

## 2020-12-19 MED ORDER — BISACODYL 10 MG RE SUPP: 10.0000 mg | Freq: Every day | RECTAL | 0 refills | Status: AC | PRN

## 2020-12-19 MED ORDER — DIPHENHYDRAMINE HCL 50 MG/ML IJ SOLN: 12.5000 mg | INTRAMUSCULAR | 0 refills | Status: AC | PRN

## 2020-12-19 MED ORDER — ALBUTEROL SULFATE (2.5 MG/3ML) 0.083% IN NEBU: 2.5000 mg | INHALATION_SOLUTION | RESPIRATORY_TRACT | 12 refills | Status: AC | PRN

## 2020-12-19 MED ORDER — BIOTENE DRY MOUTH MT LIQD: 15.0000 mL | OROMUCOSAL | Status: AC | PRN

## 2020-12-19 MED ORDER — LORAZEPAM 2 MG/ML PO CONC: 1.0000 mg | ORAL | 0 refills | Status: AC | PRN

## 2020-12-19 MED ORDER — HALOPERIDOL LACTATE 5 MG/ML IJ SOLN
5.0000 mg | Freq: Four times a day (QID) | INTRAMUSCULAR | Status: DC | PRN
  Administered 2020-12-19: 5 mg via INTRAVENOUS
  Filled 2020-12-19: qty 1

## 2020-12-19 MED ORDER — HYDROMORPHONE HCL 1 MG/ML IJ SOLN: 1.0000 mg | INTRAMUSCULAR | 0 refills | Status: AC | PRN

## 2020-12-19 MED ORDER — GLYCOPYRROLATE 0.2 MG/ML IJ SOLN: 0.4000 mg | INTRAMUSCULAR | Status: AC | PRN

## 2020-12-19 MED ORDER — NITROGLYCERIN 0.4 MG SL SUBL: 0.4000 mg | SUBLINGUAL_TABLET | SUBLINGUAL | 12 refills | Status: AC | PRN

## 2020-12-19 MED ORDER — ACETAMINOPHEN 325 MG PO TABS: 650.0000 mg | ORAL_TABLET | Freq: Four times a day (QID) | ORAL | Status: AC | PRN

## 2020-12-19 MED ORDER — ONDANSETRON HCL 4 MG PO TABS: 4.0000 mg | ORAL_TABLET | Freq: Four times a day (QID) | ORAL | 0 refills | Status: AC | PRN

## 2020-12-19 MED ORDER — HYDROMORPHONE HCL 1 MG/ML IJ SOLN
1.0000 mg | INTRAMUSCULAR | Status: DC | PRN
Start: 2020-12-19 — End: 2020-12-20
  Administered 2020-12-19 (×2): 1 mg via INTRAVENOUS
  Filled 2020-12-19 (×2): qty 1

## 2020-12-19 MED ORDER — POLYETHYLENE GLYCOL 3350 17 G PO PACK: 17.0000 g | PACK | Freq: Every day | ORAL | 0 refills | Status: AC | PRN

## 2020-12-25 ENCOUNTER — Ambulatory Visit: Payer: Self-pay

## 2020-12-25 DIAGNOSIS — N185 Chronic kidney disease, stage 5: Secondary | ICD-10-CM

## 2020-12-25 DIAGNOSIS — I5020 Unspecified systolic (congestive) heart failure: Secondary | ICD-10-CM

## 2020-12-25 NOTE — Chronic Care Management (AMB) (Signed)
Chronic Care Management   CCM RN Visit Note  12/25/2020 Name: MONCHEL POLLITT MRN: [id number] DOB: 07/25/1955  Subjective: Jayme Cloud Haaland is a 65 y.o. year old female who is a primary care patient of [name], [name], MD. The care management team was consulted for assistance with disease management and care coordination needs.    Care coordination  for  case closure  in response to provider referral for case management and/or care coordination services.   Consent to Services:  The patient was given information about Chronic Care Management services, agreed to services, and gave verbal consent prior to initiation of services.  Please see initial visit note for detailed documentation.   Patient agreed to services and verbal consent obtained.   Assessment: Review of patient past medical history, allergies, medications, health status, including review of consultants reports, laboratory and other test data, was performed as part of comprehensive evaluation and provision of chronic care management services.   SDOH (Social Determinants of Health) assessments and interventions performed:    CCM Care Plan  Allergies  Allergen Reactions   Enalapril Maleate Anaphylaxis, Swelling and Other (See Comments)    Throat swells   Penicillins Other (See Comments)    Made patient lightheaded PATIENT HAS HAD A PCN REACTION WITH IMMEDIATE RASH, FACIAL/TONGUE/THROAT SWELLING, SOB, OR LIGHTHEADEDNESS WITH HYPOTENSION:  #  #  YES  #  #  Has patient had a PCN reaction causing severe rash involving mucus membranes or skin necrosis: No Has patient had a PCN reaction that required hospitalization: No Has patient had a PCN reaction occurring within the last 10 years: No If all of the above answers are "NO", then may proceed with Cephalosporin use.    Chocolate Nausea And Vomiting   Tape Itching, Rash and Other (See Comments)    Outpatient Encounter Medications as of 12/25/2020  Medication Sig    acetaminophen (TYLENOL) 325 MG tablet Take 2 tablets (650 mg total) by mouth every 6 (six) hours as needed for mild pain (or Fever >/= 101).   albuterol (PROVENTIL) (2.5 MG/3ML) 0.083% nebulizer solution Take 3 mLs (2.5 mg total) by nebulization every 4 (four) hours as needed for wheezing or shortness of breath.   alum & mag hydroxide-simeth (MAALOX/MYLANTA) 200-200-20 MG/5ML suspension Take 30 mLs by mouth every 6 (six) hours as needed for indigestion or heartburn (dyspepsia).   antiseptic oral rinse (BIOTENE) LIQD Apply 15 mLs topically as needed for dry mouth.   bisacodyl (DULCOLAX) 10 MG suppository Place 1 suppository (10 mg total) rectally daily as needed for moderate constipation.   diphenhydrAMINE (BENADRYL) 50 MG/ML injection Inject 0.25 mLs (12.5 mg total) into the vein every 4 (four) hours as needed for itching.   glycopyrrolate (ROBINUL) 0.2 MG/ML injection Inject 2 mLs (0.4 mg total) into the vein every 4 (four) hours as needed (excessive secretions).   haloperidol lactate (HALDOL) 5 MG/ML injection Inject 1 mL (5 mg total) into the vein every 6 (six) hours as needed.   HYDROmorphone (DILAUDID) 1 MG/ML injection Inject 1 mL (1 mg total) into the vein every hour as needed for severe pain or moderate pain (dyspnea/air hunger/comfort).   LORazepam (ATIVAN) 2 MG/ML concentrated solution Place 0.5 mLs (1 mg total) under the tongue every 2 (two) hours as needed for anxiety (restlessness/agitation/comfort).   nitroGLYCERIN (NITROSTAT) 0.4 MG SL tablet Place 1 tablet (0.4 mg total) under the tongue every 5 (five) minutes as needed for chest pain.   ondansetron (ZOFRAN) 4 MG tablet Take  1 tablet (4 mg total) by mouth every 6 (six) hours as needed for nausea.   polyethylene glycol (MIRALAX / GLYCOLAX) 17 g packet Take 17 g by mouth daily as needed for mild constipation.   polyvinyl alcohol (LIQUIFILM TEARS) 1.4 % ophthalmic solution Place 1 drop into both eyes 4 (four) times daily as needed for dry  eyes.   No facility-administered encounter medications on file as of 12/25/2020.    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   Fluid overload 11/13/2020   Pulmonary edema with left heart failure ([place])    AKI (acute kidney injury) ([place]) 11/02/2020   Right upper quadrant pain    Acute renal failure superimposed on stage 4 chronic kidney disease ([place]) 11/01/2020   Pyelonephritis of transplanted kidney 11/01/2020   Thrombocytopenia ([place]) 11/01/2020   Liver mass 11/01/2020   Heme positive stool 11/01/2020   Insomnia 10/29/2020   Peripheral neuropathy 10/13/2020   Acute on chronic systolic CHF (congestive heart failure) ([place]) 07/11/2020   Acute respiratory failure with hypoxia ([place]) 07/11/2020   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   Elevated troponin 07/11/2020   Anemia of chronic disease 07/11/2020   Acute kidney injury superimposed on chronic kidney disease ([place]) 07/11/2020   Allergic rhinitis 01/01/2020   Anxiety disorder 06/26/2019   HFrEF (heart failure with reduced ejection fraction) ([place]) Apr 03, 2019   Coronary artery disease involving native coronary artery of native heart 2019-04-03   Paroxysmal atrial fibrillation ([place]) 04-03-2019   Deceased-donor kidney transplant 03/27/2018   Dyslipidemia 11/06/2017   CKD (chronic kidney disease) stage 5, GFR less than 15 ml/min ([name]) 02/15/2017   Back pain at L4-L5 level 10/31/2014   Muscle spasm of back 10/31/2014   Chronic maxillary sinusitis 07/04/2014   Occipital headache 07/04/2014   Increased urinary frequency 05/23/2014   Falls 05/23/2014   Rash and nonspecific skin eruption 05/23/2014   Hypertension with renal disease 04/16/2014   Goals of care, counseling/discussion 11/19/2013   Lupus (systemic lupus erythematosus) ([place]) 09/24/2013   Numbness  and tingling of left side of face 05/04/2013   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    Conditions to be addressed/monitored:CHF, HTN, and CKD Stage 5  Care Plan : RNCM:Heart Failure (Adult)  Updates made by Dimitri Ped, RN since 12/25/2020 12:00 AM  Completed 12/25/2020   Problem: Lack of long-term self-management of Heart Failure Resolved 12/25/2020  Priority: High     Long-Range Goal: Effective self-management of Heart Failure Completed 12/25/2020  Start Date: 09/05/2020  Expected End Date: 01/15/2021  Recent Progress: On track  Priority: High  Note:   Case closure pt in hospice and no longer eligible for CCM services Current Barriers:  Knowledge deficits related to basic heart failure pathophysiology and self care management with hx of CKD,  Lupus, kidney transplant 2020, HTN, paroxysmal Atrial Fibrillation  Unable to independently self manage heart failure Lacks social connections Unable to perform ADLs independently Financial strain  States she has needed more help since she has been in the hospital so frequently.  States that she is still getting home health therapy and the social worker has been helping her apply of more assistance with helping her take care of herself .  Denies any shortness of breath or increased swelling today. States she has been weighting  everyday.   States she is seen by providers at [facility]  for her kidneys, heart, psychiatry and nutrition. States that she wants to get a new psychiatrist  Nurse Case Manager Clinical Goal(s):  patient will weigh self daily and record patient will verbalize understanding of Heart Failure Action Plan and when to call doctor patient will take all Heart Failure mediations as prescribed Interventions:  Collaboration with [name], Betty G, MD regarding development and update of comprehensive plan of care as evidenced by provider attestation and  co-signature Inter-disciplinary care team collaboration (see longitudinal plan of care) Basic overview and discussion of pathophysiology of Heart Failure Reviewed to follow low sodium diet Reinforced Heart Failure Action Plan  Assessed for scales in home-using scale Reinforced importance of daily weight Reinforced role of diuretics in prevention of fluid overload Referred to CCM pharmacist to assist with cost of medications-pending  Referral to CCM LCSW to assist pt to find new mental health provider/psychiatrist Self-Care Activities:  Takes Heart Failure Medications as prescribed Weighs daily and record (notifying MD of 3 lb weight gain over night or 5 lb in a week) Verbalizes understanding of and follows CHF Action Plan Adheres to low sodium diet  Patient Goals:  - Take Heart Failure Medications as prescribed - Weigh daily and record (notify MD with 3 lb weight gain over night or 5 lb in a week) - Follow CHF Action Plan - Adhere to low sodium diet - develop a rescue plan - eat more whole grains, fruits and vegetables, lean meats and healthy fats - follow rescue plan if symptoms flare-up - know when to call the doctor - track symptoms and what helps feel better or worse - dress right for the weather, hot or cold - pace activity allowing for rest - do ankle pumps when sitting - keep legs up while sitting - meet with dietitian - track weight in diary - use salt in moderation - watch for swelling in feet, ankles and legs every day - weigh myself daily Follow Up Plan: Telephone follow up appointment with care management team member scheduled for: 11/13/20 at 3:30 PM The patient has been provided with contact information for the care management team and has been advised to call with any health related questions or concerns.      Care Plan : RNCM:Chronic Kidney (Adult)  Updates made by Dimitri Ped, RN since 12/25/2020 12:00 AM  Completed 12/25/2020   Problem: Disease  Progression Resolved 12/25/2020  Priority: Medium     Long-Range Goal: Disease Progression Prevented or Minimized Completed 12/25/2020  Start Date: 09/05/2020  Expected End Date: 01/15/2021  Recent Progress: On track  Priority: Medium  Note:   Case closure pt in hospice and no longer eligible for CCM services Current Barriers:  Ineffective Self Health Maintenance in a patient with CKD Stage 4 with hx of transplant Unable to independently self manage CKD stage 4 Unable to perform ADLs independently States she has to watch the amount of fluids she drinks and the salt in her diet.  Pt states she is tried a lot.  States that the iron injection did help her feel a little better.  States she does not have transportation to her injection appointment tomorrow Clinical Goal(s):  Collaboration with [name], Betty G, MD regarding development and update of comprehensive plan of care as evidenced by provider attestation and co-signature Inter-disciplinary care team collaboration (see longitudinal plan of care) patient will work with care management team to address care coordination and  chronic disease management needs related to Disease Management Educational Needs Care Coordination   Interventions:  Evaluation of current treatment plan related to CHF, HTN, CKD Stage 4, and lupus , ADL IADL limitations and Social Isolation self-management and patient's adherence to plan as established by provider. Collaboration with [name], Betty G, MD regarding development and update of comprehensive plan of care as evidenced by provider attestation       and co-signature Inter-disciplinary care team collaboration (see longitudinal plan of care) Discussed plans with patient for ongoing care management follow up and provided patient with direct contact information for care management team Reinforced importance of following fluid restrictions and renal diet  Referral made to care guide for urgent request for  transportation to infusion clinic 10/01/20 Self Care Activities:  Patient verbalizes understanding of plan to self manage CKD with hx of transplant Self administers medications as prescribed Attends all scheduled provider appointments Calls pharmacy for medication refills Performs ADL's independently Calls provider office for new concerns or questions Patient Goals: - choose foods low in fat and sugar - choose foods that are low in sodium (salt) - eat 2 servings of fruit/vegetables every day - keep healthy snacks on hand - meet with dietitian - read food labels for sodium (salt), fat and sugar content - track weight in diary - watch for swelling in feet, ankles and legs every day - weigh myself daily Follow Up Plan: Telephone follow up appointment with care management team member scheduled for: 11/13/20 at 3:30 PM The patient has been provided with contact information for the care management team and has been advised to call with any health related questions or concerns.       Plan:No further follow up required: case closed Peter Garter RN, [facility], CDE Care Management Coordinator [place] [phone], Mobile [phone]

## 2020-12-25 NOTE — Patient Instructions (Signed)
Visit Information  Thank you for allowing me to share the care management and care coordination services that are available to you as part of your health plan and services through your primary care provider and medical home. Please reach out to me at [phone] if the care management/care coordination team may be of assistance to you in the future.  Peter Garter RN, [facility] [place], CDE Care Management Coordinator [facility] [phone], Mobile [phone]

## 2021-01-14 DIAGNOSIS — I110 Hypertensive heart disease with heart failure: Secondary | ICD-10-CM

## 2021-01-14 DIAGNOSIS — I2510 Atherosclerotic heart disease of native coronary artery without angina pectoris: Secondary | ICD-10-CM | POA: Diagnosis not present

## 2021-01-14 DIAGNOSIS — N185 Chronic kidney disease, stage 5: Secondary | ICD-10-CM

## 2021-01-14 DIAGNOSIS — I129 Hypertensive chronic kidney disease with stage 1 through stage 4 chronic kidney disease, or unspecified chronic kidney disease: Secondary | ICD-10-CM | POA: Diagnosis not present

## 2021-01-14 DIAGNOSIS — F32A Depression, unspecified: Secondary | ICD-10-CM

## 2021-01-15 NOTE — TOC Transition Note (Signed)
Transition of Care [facility]) - CM/SW Discharge Note   Patient Details  Name: Jenna [name] MRN: [id number] Date of Birth: 1956-01-21  [facility] CM/SW Contact:  Emeterio Reeve, LCSW Phone Number: 01/04/2021, 1:12 PM   Clinical Narrative:     Pt discharging to [facility]. Family is aware. [facility] has been called.  CSW will sign off for now as social work intervention is no longer needed. Please consult [name] again if new needs arise.   Final next level of care: [place] Barriers to Discharge: No Barriers Identified   Patient Goals and CMS Choice        Discharge Placement              Patient chooses bed at: Other - please specify in the comment section below: ([place]) Patient to be transferred to facility by: [facility] Name of family member notified: family aware Patient and family notified of of transfer: 12/18/2020  Discharge Plan and Services                                     Social Determinants of Health (SDOH) Interventions     Readmission Risk Interventions Readmission Risk Prevention Plan 11/18/2020 11/05/2020 08/26/2020  Transportation Screening Complete Complete Complete  PCP or Specialist Appt within 3-5 Days - Complete -  HRI or [place] - Complete Complete  Social Work Consult for [place] Planning/Counseling - Complete Complete  Palliative Care Screening - Not Applicable Not Applicable  Medication Review Press photographer) Complete Complete Complete  PCP or Specialist appointment within 3-5 days of discharge Complete - -  Post or Home Care Consult Complete - -  SW Recovery Care/Counseling Consult Complete - -  Palliative Care Screening Not Applicable - -  [address] Not Complete - -  SNF Comments possible placement - -  Some recent data might be hidden     Emeterio Reeve, LCSW Clinical Social Worker

## 2021-01-15 NOTE — Progress Notes (Signed)
TRH night cross cover note:  Regarding this patient on comfort care measures only, I was notified by RN that the patient appears to be uncomfortable, in discomfort, and slightly agitated, and spite of utilization of existing orders for as needed IV Dilaudid as well as as needed Ativan.  Consequently, I increased dose of as needed Dilaudid and added as needed IV Haldol.     Babs Bertin, DO Hospitalist

## 2021-01-15 NOTE — Progress Notes (Addendum)
[employer] [id number] [facility]) Hospital Liaison Note  MSW visited with patient at bedside and patient did not respond to MSW with verbal stimuli. Consent forms have been completed and patient set to D/C via PTAR at 1400. Family and TOC aware of DC time. No questions or concerns reported to MSW at this time.   Please send signed and completed DNR with patient at D/C.   Please call with any questions/concerns.    Thank you for the opportunity to participate in this patient's care.  Addendum: Please send signed DNR form with patient and RN call report to [place], MSW [facility] Liaison  [phone]

## 2021-01-15 NOTE — Progress Notes (Signed)
Report called to nurse Newman Nickels RN at [facility].

## 2021-01-15 NOTE — Discharge Summary (Signed)
Physician Discharge Summary  Jenna [name] JJK:[id number] DOB: April 07, 1955 DOA: 12/06/2020  PCP: [name], Betty G, MD  Admit date: 12/06/2020 Discharge date: 01/14/2021  Admitted From: home Disposition:  [facility], hospice  Recommendations for Outpatient Follow-up:  Follow up with hospice team  Home Health: no  Equipment/Devices: none  Discharge Condition: stable  CODE STATUS: DNR  Diet recommendation: Dysphagia 3  Discharge Diagnoses: Principal Problem:   Pulmonary edema with left heart failure ([place]) Active Problems:   Lupus (systemic lupus erythematosus) ([place])   Goals of care, counseling/discussion   CKD (chronic kidney disease) stage 5, GFR less than 15 ml/min ([name])   Coronary artery disease involving native coronary artery of native heart   Paroxysmal atrial fibrillation ([name])   Acute on chronic systolic CHF (congestive heart failure) ([name])   Cytomegalovirus (CMV) viremia ([name])   Chest pain   Uremic encephalopathy   Cardiorenal syndrome   Encephalopathy acute   Malnutrition of moderate degree    Summary of HPI and Hospital Course: "Jenna [name], 65 y.o. 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  Was recently hospitalized for congestive heart failure.  Subsequently discharged to skilled nursing facility.  Rehospitalized due to worsening lethargy.   Family has agreed to hospice placement and has chosen [facility]. Awaiting admissions to [facility]"    [place] care per orders --D/c to residential  hospice this afternoon --Notify provider if any signs of distress, pain or discomfort despite comfort meds         Prior A&P - before transition to comfort care:    "Goals of care:  Palliative care continues to follow.  They have been communicating with patient's daughter.  Pt is now DNR/DNI.  Prognosis remains terminal.  Family has decided to pursue hospice care 12-17-2020.  [facility]. They are working on bed placement.   Progressive cardiorenal failure/Acute on chronic systolic heart failure: Repeat echo EF 25-30%, grade 3 DD, moderately elevated pulmonary artery systolic pressure. Previous EF 20-25% in July. She is followed by cardiology at [facility] / Dr. Lamonte Sakai. Chest x-ray this admission with persistent pulmonary edema, bilateral pleural effusion and infiltrates with fluid overload.  Has been poorly responsive to torsemide previously with significant rise in BUN.   Initially on intravenous furosemide.  Creatinine continued to rise with significantly high BUN. Furosemide was held for about 48 hours.  However patient's volume went up and so she was given another dose of Lasix...  Urine output remains low.   Despite all this the patient's prognosis is thought to be poor.  She is not a candidate for advanced heart failure therapies and has had not wanted hemodialysis per previous discussions.   No ACE inhibitor's or ARB due to chronic kidney disease.   Started on beta-blocker due to episodes of nonsustained ventricular tachycardia.  Improved.   Heart rate noted to be bradycardic, BB stopped   CAD/ischemic cardiomyopathy: Troponin trend was flat.   Stopped aspirin and Crestor.   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  Johnney Ou. Baseline creatinine around 5.  Was on Prograf/prednisone.   Patient has made her wishes known that she does not wish to  resume dialysis and she understands the consequence of the decision. Nephrology was consulted.   No plans to initiate dialysis with comfort care goal now.   Anemia of chronic renal disease/thrombocytopenia Hemoglobin is stable.  No evidence of overt blood loss. Platelet counts have been low at least since August.     Acute encephalopathy/uremic encephalopathy:  CT head was negative for acute findings.   EEG without seizure activity.   Ammonia level was normal.   Encephalopathic likely due to uremia.   Paroxysmal atrial fibrillation Monitor on telemetry.  Not noted to be on anticoagulation.  Now off beta-blocker.   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  Off outpatient regimen of valganciclovir   Class I Obesity:Patient's Body mass index is 31.08 kg/m. : "  Discharge Instructions   Discharge Instructions     Diet - low sodium heart healthy   Complete by: As directed    Increase activity slowly   Complete by: As directed       Allergies as of 01/10/2021       Reactions   Enalapril Maleate Anaphylaxis, Swelling, Other (See Comments)   Throat swells   Penicillins Other (See Comments)   Made patient lightheaded PATIENT HAS HAD A PCN REACTION WITH IMMEDIATE RASH, FACIAL/TONGUE/THROAT SWELLING, SOB, OR LIGHTHEADEDNESS WITH HYPOTENSION:  #  #  YES  #  #  Has patient had a PCN reaction causing severe rash involving mucus membranes or skin necrosis: No Has patient had a PCN reaction that required hospitalization: No Has patient had a PCN reaction occurring within the last 10 years: No If all of the above answers are "NO", then may proceed with Cephalosporin use.   Chocolate Nausea And Vomiting   Tape Itching, Rash, Other (See Comments)        Medication List     STOP taking these medications    ALPRAZolam 0.25 MG tablet Commonly known as: XANAX   aspirin 81 MG EC tablet   citalopram 10 MG  tablet Commonly known as: CeleXA   hydroxychloroquine 200 MG tablet Commonly known as: PLAQUENIL   isosorbide-hydrALAZINE 20-37.5 MG tablet Commonly known as: BIDIL   Lokelma 5 g packet Generic drug: sodium zirconium cyclosilicate   mirtazapine 7.5 MG tablet Commonly known as: REMERON   oxyCODONE 5 MG immediate release tablet Commonly known as: Oxy IR/ROXICODONE   pantoprazole sodium 40 mg Commonly known as: PROTONIX   predniSONE 5 MG tablet Commonly known as: DELTASONE   rosuvastatin 5 MG tablet Commonly known as: CRESTOR   simethicone 80 MG chewable tablet Commonly known as: MYLICON   sodium bicarbonate 650 MG tablet   tacrolimus 0.5 MG capsule Commonly known as: PROGRAF   valGANciclovir 450 MG tablet Commonly known as: VALCYTE       TAKE these medications    acetaminophen 325 MG tablet Commonly known as: TYLENOL Take 2 tablets (650 mg total) by mouth every 6 (six) hours as needed for mild pain (or Fever >/= 101). What changed:  medication strength how much to take reasons to take this   albuterol (2.5 MG/3ML) 0.083% nebulizer solution Commonly known as: PROVENTIL Take 3 mLs (2.5 mg total) by nebulization every 4 (four) hours as needed for wheezing or shortness of breath.   alum & mag hydroxide-simeth 200-200-20 MG/5ML suspension Commonly known  as: MAALOX/MYLANTA Take 30 mLs by mouth every 6 (six) hours as needed for indigestion or heartburn (dyspepsia).   antiseptic oral rinse Liqd Apply 15 mLs topically as needed for dry mouth.   bisacodyl 10 MG suppository Commonly known as: DULCOLAX Place 1 suppository (10 mg total) rectally daily as needed for moderate constipation.   diphenhydrAMINE 50 MG/ML injection Commonly known as: BENADRYL Inject 0.25 mLs (12.5 mg total) into the vein every 4 (four) hours as needed for itching.   glycopyrrolate 0.2 MG/ML injection Commonly known as: ROBINUL Inject 2 mLs (0.4 mg total) into the vein every 4 (four)  hours as needed (excessive secretions).   haloperidol lactate 5 MG/ML injection Commonly known as: HALDOL Inject 1 mL (5 mg total) into the vein every 6 (six) hours as needed.   HYDROmorphone 1 MG/ML injection Commonly known as: DILAUDID Inject 1 mL (1 mg total) into the vein every hour as needed for severe pain or moderate pain (dyspnea/air hunger/comfort).   LORazepam 2 MG/ML concentrated solution Commonly known as: ATIVAN Place 0.5 mLs (1 mg total) under the tongue every 2 (two) hours as needed for anxiety (restlessness/agitation/comfort).   nitroGLYCERIN 0.4 MG SL tablet Commonly known as: NITROSTAT Place 1 tablet (0.4 mg total) under the tongue every 5 (five) minutes as needed for chest pain.   ondansetron 4 MG tablet Commonly known as: ZOFRAN Take 1 tablet (4 mg total) by mouth every 6 (six) hours as needed for nausea.   polyethylene glycol 17 g packet Commonly known as: MIRALAX / GLYCOLAX Take 17 g by mouth daily as needed for mild constipation.   polyvinyl alcohol 1.4 % ophthalmic solution Commonly known as: LIQUIFILM TEARS Place 1 drop into both eyes 4 (four) times daily as needed for dry eyes.        Allergies  Allergen Reactions   Enalapril Maleate Anaphylaxis, Swelling and Other (See Comments)    Throat swells   Penicillins Other (See Comments)    Made patient lightheaded PATIENT HAS HAD A PCN REACTION WITH IMMEDIATE RASH, FACIAL/TONGUE/THROAT SWELLING, SOB, OR LIGHTHEADEDNESS WITH HYPOTENSION:  #  #  YES  #  #  Has patient had a PCN reaction causing severe rash involving mucus membranes or skin necrosis: No Has patient had a PCN reaction that required hospitalization: No Has patient had a PCN reaction occurring within the last 10 years: No If all of the above answers are "NO", then may proceed with Cephalosporin use.    Chocolate Nausea And Vomiting   Tape Itching, Rash and Other (See Comments)     If you experience worsening of your admission symptoms,  develop shortness of breath, life threatening emergency, suicidal or homicidal thoughts you must seek medical attention immediately by calling 911 or calling your MD immediately  if symptoms less severe.    Please note   You were cared for by a hospitalist during your hospital stay. If you have any questions about your discharge medications or the care you received while you were in the hospital after you are discharged, you can call the unit and asked to speak with the hospitalist on call if the hospitalist that took care of you is not available. Once you are discharged, your primary care physician will handle any further medical issues. Please note that NO REFILLS for any discharge medications will be authorized once you are discharged, as it is imperative that you return to your primary care physician (or establish a relationship with a primary care physician if  you do not have one) for your aftercare needs so that they can reassess your need for medications and monitor your lab values.   Consultations: [facility]   Procedures/Studies: CT HEAD WO CONTRAST (5MM)  Result Date: 12/08/2020 CLINICAL DATA:  Mental status change, unknown cause EXAM: CT HEAD WITHOUT CONTRAST TECHNIQUE: Contiguous axial images were obtained from the base of the skull through the vertex without intravenous contrast. COMPARISON:  11/13/2020 FINDINGS: Brain: No evidence of acute infarction, hemorrhage, cerebral edema, mass, mass effect, or midline shift. Ventricles and sulci are within normal limits for age. No extra-axial fluid collection. 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 Vascular: No hyperdense vessel. Atherosclerotic calcifications in the intracranial carotid and vertebral arteries. Skull: Normal. Negative for fracture or focal lesion. Sinuses/Orbits: No acute finding. Other: The mastoid air cells are well aerated. IMPRESSION: No acute intracranial process.  Electronically Signed   By: Merilyn Baba M.D.   On: 12/08/2020 02:16   DG Chest Port 1 View  Result Date: 12/06/2020 CLINICAL DATA:  Chest pain. EXAM: PORTABLE CHEST 1 VIEW COMPARISON:  November 17, 2020 FINDINGS: Moderate severity dense airspace opacity is again seen within the right lower lobe, decreased in severity when compared to the prior study. Predominant stable left basilar opacity is seen. Very small, stable bilateral pleural effusions are seen. No pneumothorax is identified. The cardiac silhouette is markedly enlarged and unchanged in size. The visualized skeletal structures are unremarkable. IMPRESSION: 1. Moderate severity dense right lower lobe airspace opacity, decreased in severity when compared to the prior study. 2. Stable left basilar opacity, consistent with atelectasis and/or infiltrate. 3. Very small, stable bilateral pleural effusions. Electronically Signed   By: Virgina Norfolk M.D.   On: 12/06/2020 20:35   EEG adult  Result Date: 12/08/2020 Lora Havens, MD     12/08/2020 12:51 PM Patient Name: Jenna [name] MRN: [id number] Epilepsy Attending: Lora Havens Referring Physician/Provider: Dr Dessa Phi Date: 12/08/2020 Duration: 23.18 mins Patient history: Level of alertness: Awake AEDs during EEG study: Technical aspects: This EEG study was done with scalp electrodes positioned according to the 10-20 International system of electrode placement. Electrical activity was acquired at a sampling rate of $Remov'500Hz'KryCsx$  and reviewed with a high frequency filter of $RemoveB'70Hz'krekdfvE$  and a low frequency filter of $RemoveB'1Hz'eaWBbwGo$ . EEG data were recorded continuously and digitally stored. Description: EEG showed continued generalized lumbar every 3-5 hours theta-delta slowing.  Hyperventilation and photic stimulation were not performed.   ABNORMALITY - Continuous slow, generalized IMPRESSION: This study is suggestive of moderate diffuse encephalopathy, nonspecific etiology. No seizures or epileptiform discharges  were seen throughout the recording. Lora Havens   ECHOCARDIOGRAM COMPLETE  Result Date: 12/07/2020    ECHOCARDIOGRAM REPORT   Patient Name:   Jenna [name] Date of Exam: 12/07/2020 Medical Rec #:  [id number]        Height:       67.0 in Accession #:    [id number]       Weight:       207.7 lb Date of Birth:  12/13/1955        BSA:          2.055 m Patient Age:    9 years         BP:           154/93 mmHg Patient Gender: F                HR:  80 bpm. Exam Location:  Inpatient Procedure: 2D Echo, Cardiac Doppler, Color Doppler, Intracardiac Opacification            Agent and 3D Echo REPORT CONTAINS CRITICAL RESULT Indications:    R07.9* Chest pain, unspecified  History:        Patient has prior history of Echocardiogram examinations, most                 recent 05/06/2013. CAD, TIA, Signs/Symptoms:Chest Pain; Risk                 Factors:Hypertension and Dyslipidemia. Lupus.  Sonographer:    Roseanna Rainbow RDCS Referring Phys: [id number] [facility]  Sonographer Comments: Technically difficult study due to poor echo windows. IMPRESSIONS  1. Global hypokinesis, the apex and anteroseptum appear akinetic. Marland Kitchen Left ventricular ejection fraction, by estimation, is 25 to 30%. The left ventricle has severely decreased function. The left ventricle demonstrates global hypokinesis. There is mild left ventricular hypertrophy. Left ventricular diastolic parameters are consistent with Grade III diastolic dysfunction (restrictive). Elevated left atrial pressure.  2. Right ventricular systolic function is low normal. The right ventricular size is moderately enlarged. There is moderately elevated pulmonary artery systolic pressure.  3. Left atrial size was moderately dilated.  4. Right atrial size was moderately dilated.  5. The mitral valve is abnormal. Moderate mitral valve regurgitation. No evidence of mitral stenosis.  6. The tricuspid valve is abnormal. Tricuspid valve regurgitation is severe.  7. The aortic valve is  tricuspid. There is mild calcification of the aortic valve. There is mild thickening of the aortic valve. Aortic valve regurgitation is not visualized. No aortic stenosis is present.  8. Pulmonic valve regurgitation is moderate.  9. The inferior vena cava is dilated in size with <50% respiratory variability, suggesting right atrial pressure of 15 mmHg. FINDINGS  Left Ventricle: Global hypokinesis, the apex and anteroseptum appear akinetic. Left ventricular ejection fraction, by estimation, is 25 to 30%. The left ventricle has severely decreased function. The left ventricle demonstrates global hypokinesis. Definity contrast agent was given IV to delineate the left ventricular endocardial borders. The left ventricular internal cavity size was normal in size. There is mild left ventricular hypertrophy. Left ventricular diastolic parameters are consistent with Grade III diastolic dysfunction (restrictive). Elevated left atrial pressure. Right Ventricle: Ventricular septum is flattened in diastole consistent with RV volume overload. The right ventricular size is moderately enlarged. Right vetricular wall thickness was not assessed. Right ventricular systolic function is low normal. There  is moderately elevated pulmonary artery systolic pressure. The tricuspid regurgitant velocity is 3.22 m/s, and with an assumed right atrial pressure of 15 mmHg, the estimated right ventricular systolic pressure is 49.4 mmHg. Left Atrium: Left atrial size was moderately dilated. Right Atrium: Right atrial size was moderately dilated. Pericardium: There is no evidence of pericardial effusion. Mitral Valve: The MV/AV VTI ration is 1.1 suggestion moderate MR. The mitral valve is abnormal. There is mild thickening of the mitral valve leaflet(s). There is mild calcification of the mitral valve leaflet(s). Mild mitral annular calcification. Moderate mitral valve regurgitation. No evidence of mitral valve stenosis. MV peak gradient, 8.0 mmHg.  The mean mitral valve gradient is 2.0 mmHg. Tricuspid Valve: The tricuspid valve is abnormal. Tricuspid valve regurgitation is severe. No evidence of tricuspid stenosis. Aortic Valve: The aortic valve is tricuspid. There is mild calcification of the aortic valve. There is mild thickening of the aortic valve. There is mild aortic valve annular calcification. Aortic valve regurgitation is  not visualized. No aortic stenosis  is present. Aortic valve mean gradient measures 5.0 mmHg. Aortic valve peak gradient measures 11.9 mmHg. Aortic valve area, by VTI measures 2.04 cm. Pulmonic Valve: The pulmonic valve was not well visualized. Pulmonic valve regurgitation is moderate. No evidence of pulmonic stenosis. Aorta: The aortic root is normal in size and structure. Venous: The inferior vena cava is dilated in size with less than 50% respiratory variability, suggesting right atrial pressure of 15 mmHg. IAS/Shunts: No atrial level shunt detected by color flow Doppler.  LEFT VENTRICLE PLAX 2D LVIDd:         4.60 cm      Diastology LVIDs:         3.90 cm      LV e' medial:    4.68 cm/s LV PW:         1.20 cm      LV E/e' medial:  29.1 LV IVS:        1.20 cm      LV e' lateral:   10.30 cm/s LVOT diam:     1.90 cm      LV E/e' lateral: 13.2 LV SV:         62 LV SV Index:   30 LVOT Area:     2.84 cm                              3D Volume EF: LV Volumes (MOD)            3D EF:        26 % LV vol d, MOD A2C: 205.0 ml LV EDV:       194 ml LV vol d, MOD A4C: 190.0 ml LV ESV:       143 ml LV vol s, MOD A2C: 163.0 ml LV SV:        51 ml LV vol s, MOD A4C: 144.0 ml LV SV MOD A2C:     42.0 ml LV SV MOD A4C:     190.0 ml LV SV MOD BP:      47.8 ml RIGHT VENTRICLE            IVC RV S prime:     9.36 cm/s  IVC diam: 3.00 cm LEFT ATRIUM             Index        RIGHT ATRIUM           Index LA diam:        4.80 cm 2.34 cm/m   RA Area:     26.00 cm LA Vol (A2C):   53.4 ml 25.99 ml/m  RA Volume:   91.60 ml  44.58 ml/m LA Vol (A4C):   59.2  ml 28.81 ml/m LA Biplane Vol: 56.5 ml 27.50 ml/m  AORTIC VALVE                     PULMONIC VALVE AV Area (Vmax):    2.09 cm      PR End Diast Vel: 2.25 msec AV Area (Vmean):   2.26 cm AV Area (VTI):     2.04 cm AV Vmax:           172.45 cm/s AV Vmean:          100.111 cm/s AV VTI:            0.303 m AV Peak Grad:  11.9 mmHg AV Mean Grad:      5.0 mmHg LVOT Vmax:         127.00 cm/s LVOT Vmean:        79.700 cm/s LVOT VTI:          0.218 m LVOT/AV VTI ratio: 0.72  AORTA Ao Root diam: 3.20 cm Ao Asc diam:  3.30 cm MITRAL VALVE                  TRICUSPID VALVE MV Area (PHT): 5.84 cm       TR Peak grad:   41.5 mmHg MV Area VTI:   2.05 cm       TR Vmax:        322.00 cm/s MV Peak grad:  8.0 mmHg MV Mean grad:  2.0 mmHg       SHUNTS MV Vmax:       1.41 m/s       Systemic VTI:  0.22 m MV Vmean:      68.9 cm/s      Systemic Diam: 1.90 cm MV Decel Time: 130 msec MR Peak grad:    83.9 mmHg MR Mean grad:    62.0 mmHg MR Vmax:         458.00 cm/s MR Vmean:        373.0 cm/s MR PISA:         1.57 cm MR PISA Eff ROA: 13 mm MR PISA Radius:  0.50 cm MV E velocity: 136.00 cm/s MV A velocity: 119.00 cm/s MV E/A ratio:  1.14 Carlyle Dolly MD Electronically signed by Carlyle Dolly MD Signature Date/Time: 12/07/2020/4:44:12 PM    Final       Subjective: Pt sleeping soundly.  Does not respond to voice or tactile stimulus.  No acute events reported.   Discharge Exam: Vitals:   12/18/20 1219 12/16/2020 0416  BP: 119/66 (!) 84/69  Pulse: 71 94  Resp: 18 15  Temp: (!) 96.5 F (35.8 C) (!) 97.5 F (36.4 C)  SpO2:  (!) 62%   Vitals:   12/17/20 0539 12/18/20 0538 12/18/20 1219 01/02/2021 0416  BP:  119/84 119/66 (!) 84/69  Pulse: (!) 59 66 71 94  Resp:  $Remo'20 18 15  'sPaun$ Temp: (!) 96.6 F (35.9 C) (!) 96.2 F (35.7 C) (!) 96.5 F (35.8 C) (!) 97.5 F (36.4 C)  TempSrc: Axillary Axillary Axillary Axillary  SpO2: 93% 98%  (!) 62%  Weight:      Height:        General: sleeping comfortably, does not respond  to voice or tactile stimulation, not in acute distress Cardiovascular: RRR, S1/S2 +, no rubs, no gallops Respiratory: CTA bilaterally, no wheezing, no rhonchi Abdominal: Soft, NT, ND, bowel sounds + Extremities: no edema, no cyanosis    The results of significant diagnostics from this hospitalization (including imaging, microbiology, ancillary and laboratory) are listed below for reference.     Microbiology: No results found for this or any previous visit (from the past 240 hour(s)).   Labs: BNP (last 3 results) Recent Labs    11/10/20 0223 11/13/20 1442 12/06/20 2014  BNP >4,500.0* >4,500.0* >0,802.2*   Basic Metabolic Panel: Recent Labs  Lab 12/13/20 0212 12/14/20 0257 12/15/20 0157 12/16/20 0234 12/17/20 0321  NA 143 144 144 142 144  K 3.9 4.2 4.4 4.9 4.8  CL 111 111 110 108 111  CO2 18* 18* 18* 16* 17*  GLUCOSE 103* 94 107* 107* 137*  BUN 142* 148* 157*  160* 163*  CREATININE 5.96* 6.18* 6.42* 6.65* 7.03*  CALCIUM 9.4 9.6 9.5 9.5 9.5   Liver Function Tests: No results for input(s): AST, ALT, ALKPHOS, BILITOT, PROT, ALBUMIN in the last 168 hours. No results for input(s): LIPASE, AMYLASE in the last 168 hours. No results for input(s): AMMONIA in the last 168 hours. CBC: Recent Labs  Lab 12/13/20 0212  WBC 4.0  HGB 10.6*  HCT 32.5*  MCV 89.8  PLT 64*   Cardiac Enzymes: No results for input(s): CKTOTAL, CKMB, CKMBINDEX, TROPONINI in the last 168 hours. BNP: Invalid input(s): POCBNP CBG: No results for input(s): GLUCAP in the last 168 hours. D-Dimer No results for input(s): DDIMER in the last 72 hours. Hgb A1c No results for input(s): HGBA1C in the last 72 hours. Lipid Profile No results for input(s): CHOL, HDL, LDLCALC, TRIG, CHOLHDL, LDLDIRECT in the last 72 hours. Thyroid function studies No results for input(s): TSH, T4TOTAL, T3FREE, THYROIDAB in the last 72 hours.  Invalid input(s): FREET3 Anemia work up No results for input(s): VITAMINB12,  FOLATE, FERRITIN, TIBC, IRON, RETICCTPCT in the last 72 hours. Urinalysis    Component Value Date/Time   COLORURINE YELLOW 11/13/2020 1410   APPEARANCEUR CLEAR 11/13/2020 1410   LABSPEC 1.015 11/13/2020 1410   PHURINE 5.0 11/13/2020 1410   GLUCOSEU NEGATIVE 11/13/2020 1410   HGBUR NEGATIVE 11/13/2020 1410   BILIRUBINUR NEGATIVE 11/13/2020 1410   BILIRUBINUR neg 12/19/2014 1157   KETONESUR 5 (A) 11/13/2020 1410   PROTEINUR >=300 (A) 11/13/2020 1410   UROBILINOGEN 0.2 12/19/2014 1157   UROBILINOGEN 0.2 05/23/2014 1301   NITRITE NEGATIVE 11/13/2020 1410   LEUKOCYTESUR NEGATIVE 11/13/2020 1410   Sepsis Labs Invalid input(s): PROCALCITONIN,  WBC,  LACTICIDVEN Microbiology No results found for this or any previous visit (from the past 240 hour(s)).   Time coordinating discharge: Over 30 minutes  SIGNED:   Ezekiel Slocumb, DO [facility] 01/05/2021, 4:00 PM   If 7PM-7AM, please contact [employer]-coverage [email or web address]

## 2021-01-15 DEATH — deceased

## 2021-05-14 ENCOUNTER — Encounter (HOSPITAL_COMMUNITY): Payer: Self-pay | Admitting: Radiology

## 2021-06-02 ENCOUNTER — Ambulatory Visit: Payer: Self-pay

## 2021-06-02 ENCOUNTER — Telehealth: Payer: Self-pay

## 2021-06-02 NOTE — Telephone Encounter (Signed)
This nurse attempted to call patient three times for scheduled virtual AWV. Message left that we will call again to reschedule for another time. ?

## 2021-06-15 ENCOUNTER — Telehealth: Payer: Self-pay | Admitting: Family Medicine

## 2021-06-15 NOTE — Telephone Encounter (Signed)
Left message for patient to call back and schedule Medicare Annual Wellness Visit (AWV) either virtually or in office. Left  my Herbie Drape number [phone] ? ? ?Last AWV 05/27/20 ?please schedule at anytime with [facility], [facility] Nurse Health Advisor 1 or 2 ? ? ? ?
